# Patient Record
Sex: Female | Born: 1940
Health system: Southern US, Community
[De-identification: ages and names within clinical notes are randomized; demographics above are authoritative.]

## PROBLEM LIST (undated history)

## (undated) DIAGNOSIS — F32A Depression, unspecified: Secondary | ICD-10-CM

## (undated) DIAGNOSIS — R058 Other specified cough: Secondary | ICD-10-CM

## (undated) DIAGNOSIS — M1990 Unspecified osteoarthritis, unspecified site: Secondary | ICD-10-CM

## (undated) DIAGNOSIS — K219 Gastro-esophageal reflux disease without esophagitis: Secondary | ICD-10-CM

## (undated) DIAGNOSIS — J189 Pneumonia, unspecified organism: Secondary | ICD-10-CM

## (undated) DIAGNOSIS — R0789 Other chest pain: Secondary | ICD-10-CM

## (undated) DIAGNOSIS — K5900 Constipation, unspecified: Secondary | ICD-10-CM

## (undated) DIAGNOSIS — H3530 Unspecified macular degeneration: Secondary | ICD-10-CM

## (undated) DIAGNOSIS — I82409 Acute embolism and thrombosis of unspecified deep veins of unspecified lower extremity: Secondary | ICD-10-CM

## (undated) DIAGNOSIS — N760 Acute vaginitis: Secondary | ICD-10-CM

## (undated) DIAGNOSIS — I050 Rheumatic mitral stenosis: Secondary | ICD-10-CM

## (undated) DIAGNOSIS — I10 Essential (primary) hypertension: Secondary | ICD-10-CM

## (undated) DIAGNOSIS — R05 Cough: Secondary | ICD-10-CM

## (undated) DIAGNOSIS — T464X5A Adverse effect of angiotensin-converting-enzyme inhibitors, initial encounter: Secondary | ICD-10-CM

## (undated) DIAGNOSIS — R7309 Other abnormal glucose: Secondary | ICD-10-CM

## (undated) DIAGNOSIS — B9689 Other specified bacterial agents as the cause of diseases classified elsewhere: Secondary | ICD-10-CM

## (undated) DIAGNOSIS — R7989 Other specified abnormal findings of blood chemistry: Secondary | ICD-10-CM

## (undated) DIAGNOSIS — N183 Chronic kidney disease, stage 3 (moderate): Secondary | ICD-10-CM

## (undated) DIAGNOSIS — L0390 Cellulitis, unspecified: Secondary | ICD-10-CM

## (undated) DIAGNOSIS — N289 Disorder of kidney and ureter, unspecified: Secondary | ICD-10-CM

## (undated) DIAGNOSIS — F329 Major depressive disorder, single episode, unspecified: Secondary | ICD-10-CM

## (undated) DIAGNOSIS — D649 Anemia, unspecified: Secondary | ICD-10-CM

## (undated) DIAGNOSIS — I5030 Unspecified diastolic (congestive) heart failure: Secondary | ICD-10-CM

## (undated) DIAGNOSIS — E785 Hyperlipidemia, unspecified: Secondary | ICD-10-CM

## (undated) DIAGNOSIS — E559 Vitamin D deficiency, unspecified: Secondary | ICD-10-CM

## (undated) DIAGNOSIS — M353 Polymyalgia rheumatica: Secondary | ICD-10-CM

## (undated) DIAGNOSIS — R0902 Hypoxemia: Secondary | ICD-10-CM

## (undated) DIAGNOSIS — J449 Chronic obstructive pulmonary disease, unspecified: Secondary | ICD-10-CM

## (undated) DIAGNOSIS — M8580 Other specified disorders of bone density and structure, unspecified site: Secondary | ICD-10-CM

## (undated) HISTORY — DX: Hyperlipidemia, unspecified: E78.5

## (undated) HISTORY — DX: Hypoxemia: R09.02

## (undated) HISTORY — DX: Unspecified osteoarthritis, unspecified site: M19.90

## (undated) HISTORY — DX: Unspecified diastolic (congestive) heart failure: I50.30

## (undated) HISTORY — DX: Other chest pain: R07.89

## (undated) HISTORY — DX: Major depressive disorder, single episode, unspecified: F32.9

## (undated) HISTORY — DX: Other abnormal glucose: R73.09

## (undated) HISTORY — DX: Gastro-esophageal reflux disease without esophagitis: K21.9

## (undated) HISTORY — DX: Rheumatic mitral stenosis: I05.0

## (undated) HISTORY — DX: Other specified cough: T46.4X5A

## (undated) HISTORY — DX: Other specified disorders of bone density and structure, unspecified site: M85.80

## (undated) HISTORY — DX: Essential (primary) hypertension: I10

## (undated) HISTORY — DX: Other specified cough: R05.8

## (undated) HISTORY — DX: Depression, unspecified: F32.A

## (undated) HISTORY — DX: Chronic kidney disease, stage 3 (moderate): N18.3

## (undated) HISTORY — DX: Cough: R05

## (undated) HISTORY — DX: Hypercalcemia: E83.52

## (undated) HISTORY — DX: Pneumonia, unspecified organism: J18.9

## (undated) HISTORY — DX: Vitamin D deficiency, unspecified: E55.9

## (undated) HISTORY — DX: Other specified bacterial agents as the cause of diseases classified elsewhere: B96.89

## (undated) HISTORY — DX: Acute vaginitis: N76.0

## (undated) HISTORY — DX: Constipation, unspecified: K59.00

## (undated) HISTORY — DX: Cellulitis, unspecified: L03.90

## (undated) HISTORY — DX: Polymyalgia rheumatica: M35.3

## (undated) HISTORY — DX: Disorder of kidney and ureter, unspecified: N28.9

## (undated) HISTORY — DX: Anemia, unspecified: D64.9

## (undated) HISTORY — DX: Acute embolism and thrombosis of unspecified deep veins of unspecified lower extremity: I82.409

## (undated) HISTORY — DX: Other specified abnormal findings of blood chemistry: R79.89

## (undated) HISTORY — DX: Unspecified macular degeneration: H35.30

---
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
## 1966-01-20 HISTORY — PX: TUBAL LIGATION: SHX77

## 1985-01-20 DIAGNOSIS — I82409 Acute embolism and thrombosis of unspecified deep veins of unspecified lower extremity: Secondary | ICD-10-CM

## 1985-01-20 HISTORY — DX: Acute embolism and thrombosis of unspecified deep veins of unspecified lower extremity: I82.409

## 1997-05-19 ENCOUNTER — Ambulatory Visit (HOSPITAL_COMMUNITY): Admission: RE | Admit: 1997-05-19 | Discharge: 1997-05-19 | Payer: Self-pay | Admitting: Internal Medicine

## 1999-07-14 ENCOUNTER — Encounter: Payer: Self-pay | Admitting: Emergency Medicine

## 1999-07-14 ENCOUNTER — Emergency Department (HOSPITAL_COMMUNITY): Admission: EM | Admit: 1999-07-14 | Discharge: 1999-07-14 | Payer: Self-pay | Admitting: Emergency Medicine

## 1999-08-06 ENCOUNTER — Encounter: Payer: Self-pay | Admitting: Internal Medicine

## 1999-08-06 ENCOUNTER — Ambulatory Visit (HOSPITAL_COMMUNITY): Admission: RE | Admit: 1999-08-06 | Discharge: 1999-08-06 | Payer: Self-pay | Admitting: Internal Medicine

## 2000-07-20 ENCOUNTER — Ambulatory Visit (HOSPITAL_COMMUNITY): Admission: RE | Admit: 2000-07-20 | Discharge: 2000-07-20 | Payer: Self-pay | Admitting: Internal Medicine

## 2000-08-13 ENCOUNTER — Other Ambulatory Visit: Admission: RE | Admit: 2000-08-13 | Discharge: 2000-08-13 | Payer: Self-pay | Admitting: Internal Medicine

## 2003-07-30 ENCOUNTER — Emergency Department (HOSPITAL_COMMUNITY): Admission: EM | Admit: 2003-07-30 | Discharge: 2003-07-30 | Payer: Self-pay | Admitting: Emergency Medicine

## 2004-04-29 ENCOUNTER — Ambulatory Visit: Payer: Self-pay | Admitting: Internal Medicine

## 2004-05-17 ENCOUNTER — Ambulatory Visit: Payer: Self-pay | Admitting: Internal Medicine

## 2004-05-24 ENCOUNTER — Ambulatory Visit: Payer: Self-pay | Admitting: Internal Medicine

## 2004-05-24 ENCOUNTER — Other Ambulatory Visit: Admission: RE | Admit: 2004-05-24 | Discharge: 2004-05-24 | Payer: Self-pay | Admitting: Internal Medicine

## 2004-06-19 ENCOUNTER — Encounter: Payer: Self-pay | Admitting: Internal Medicine

## 2004-09-17 ENCOUNTER — Emergency Department (HOSPITAL_COMMUNITY): Admission: EM | Admit: 2004-09-17 | Discharge: 2004-09-17 | Payer: Self-pay | Admitting: Family Medicine

## 2004-10-18 ENCOUNTER — Ambulatory Visit: Payer: Self-pay | Admitting: Family Medicine

## 2005-02-19 ENCOUNTER — Ambulatory Visit: Payer: Self-pay | Admitting: Internal Medicine

## 2005-03-28 ENCOUNTER — Ambulatory Visit: Payer: Self-pay | Admitting: Internal Medicine

## 2005-04-08 ENCOUNTER — Ambulatory Visit: Payer: Self-pay | Admitting: Internal Medicine

## 2005-04-08 ENCOUNTER — Ambulatory Visit (HOSPITAL_COMMUNITY): Admission: RE | Admit: 2005-04-08 | Discharge: 2005-04-08 | Payer: Self-pay | Admitting: Internal Medicine

## 2005-04-18 ENCOUNTER — Ambulatory Visit: Payer: Self-pay | Admitting: Internal Medicine

## 2005-05-02 ENCOUNTER — Ambulatory Visit: Payer: Self-pay | Admitting: Internal Medicine

## 2005-07-24 ENCOUNTER — Ambulatory Visit: Payer: Self-pay | Admitting: Internal Medicine

## 2005-08-01 ENCOUNTER — Ambulatory Visit: Payer: Self-pay | Admitting: Internal Medicine

## 2005-08-08 ENCOUNTER — Ambulatory Visit: Payer: Self-pay | Admitting: Internal Medicine

## 2005-08-08 LAB — HM COLONOSCOPY

## 2005-10-29 ENCOUNTER — Ambulatory Visit: Payer: Self-pay | Admitting: Endocrinology

## 2005-11-18 ENCOUNTER — Ambulatory Visit: Payer: Self-pay | Admitting: Internal Medicine

## 2005-12-16 ENCOUNTER — Ambulatory Visit: Payer: Self-pay | Admitting: Internal Medicine

## 2005-12-16 LAB — CONVERTED CEMR LAB
BUN: 19 mg/dL (ref 6–23)
CO2: 28 meq/L (ref 19–32)
Chloride: 106 meq/L (ref 96–112)
Chol/HDL Ratio, serum: 5.3
Cholesterol: 220 mg/dL (ref 0–200)
Glucose, Bld: 97 mg/dL (ref 70–99)
HDL: 41.5 mg/dL (ref >39.0)
Hgb A1c MFr Bld: 5.6 % (ref 4.6–6.0)
Potassium: 4.6 meq/L (ref 3.5–5.1)
Triglyceride fasting, serum: 172 mg/dL — ABNORMAL HIGH (ref 0–149)
VLDL: 34 mg/dL (ref 0–40)

## 2005-12-23 ENCOUNTER — Ambulatory Visit: Payer: Self-pay | Admitting: Internal Medicine

## 2006-01-27 ENCOUNTER — Ambulatory Visit: Payer: Self-pay | Admitting: Internal Medicine

## 2006-01-27 LAB — CONVERTED CEMR LAB
ALT: 20 units/L (ref 0–40)
AST: 19 units/L (ref 0–37)
BUN: 19 mg/dL (ref 6–23)
CO2: 30 meq/L (ref 19–32)
Chol/HDL Ratio, serum: 3.1
Glomerular Filtration Rate, Af Am: 64 mL/min/{1.73_m2}
Glucose, Bld: 102 mg/dL — ABNORMAL HIGH (ref 70–99)
LDL Cholesterol: 80 mg/dL (ref 0–99)
Potassium: 4 meq/L (ref 3.5–5.1)
Triglyceride fasting, serum: 122 mg/dL (ref 0–149)
VLDL: 24 mg/dL (ref 0–40)

## 2006-02-10 ENCOUNTER — Ambulatory Visit: Payer: Self-pay | Admitting: Internal Medicine

## 2006-03-17 LAB — HM MAMMOGRAPHY

## 2006-04-15 ENCOUNTER — Emergency Department (HOSPITAL_COMMUNITY): Admission: EM | Admit: 2006-04-15 | Discharge: 2006-04-15 | Payer: Self-pay | Admitting: Emergency Medicine

## 2006-05-05 ENCOUNTER — Ambulatory Visit: Payer: Self-pay | Admitting: Internal Medicine

## 2006-05-21 ENCOUNTER — Ambulatory Visit: Payer: Self-pay

## 2006-05-21 ENCOUNTER — Ambulatory Visit: Payer: Self-pay | Admitting: Internal Medicine

## 2006-05-21 LAB — CONVERTED CEMR LAB
Chloride: 107 meq/L (ref 96–112)
GFR calc non Af Amer: 59 mL/min

## 2006-06-30 ENCOUNTER — Ambulatory Visit: Payer: Self-pay | Admitting: Internal Medicine

## 2006-08-21 ENCOUNTER — Encounter: Payer: Self-pay | Admitting: Internal Medicine

## 2006-08-21 DIAGNOSIS — I809 Phlebitis and thrombophlebitis of unspecified site: Secondary | ICD-10-CM | POA: Insufficient documentation

## 2006-08-21 DIAGNOSIS — J4540 Moderate persistent asthma, uncomplicated: Secondary | ICD-10-CM | POA: Insufficient documentation

## 2006-08-21 DIAGNOSIS — K219 Gastro-esophageal reflux disease without esophagitis: Secondary | ICD-10-CM | POA: Insufficient documentation

## 2006-08-21 DIAGNOSIS — M1990 Unspecified osteoarthritis, unspecified site: Secondary | ICD-10-CM | POA: Insufficient documentation

## 2006-09-09 ENCOUNTER — Emergency Department (HOSPITAL_COMMUNITY): Admission: EM | Admit: 2006-09-09 | Discharge: 2006-09-09 | Payer: Self-pay | Admitting: Emergency Medicine

## 2006-09-11 ENCOUNTER — Ambulatory Visit: Payer: Self-pay | Admitting: Internal Medicine

## 2006-09-11 ENCOUNTER — Ambulatory Visit: Payer: Self-pay | Admitting: Cardiology

## 2006-09-11 ENCOUNTER — Inpatient Hospital Stay (HOSPITAL_COMMUNITY): Admission: AD | Admit: 2006-09-11 | Discharge: 2006-09-15 | Payer: Self-pay | Admitting: Internal Medicine

## 2006-09-12 ENCOUNTER — Encounter: Payer: Self-pay | Admitting: Internal Medicine

## 2006-09-18 ENCOUNTER — Ambulatory Visit: Payer: Self-pay | Admitting: Internal Medicine

## 2006-09-18 ENCOUNTER — Encounter: Payer: Self-pay | Admitting: Cardiovascular Disease

## 2006-09-18 ENCOUNTER — Ambulatory Visit: Payer: Self-pay

## 2006-09-18 ENCOUNTER — Observation Stay (HOSPITAL_COMMUNITY): Admission: AD | Admit: 2006-09-18 | Discharge: 2006-09-19 | Payer: Self-pay | Admitting: Internal Medicine

## 2006-09-19 ENCOUNTER — Encounter: Payer: Self-pay | Admitting: Cardiovascular Disease

## 2006-09-19 ENCOUNTER — Ambulatory Visit: Payer: Self-pay | Admitting: Surgery

## 2006-10-01 ENCOUNTER — Ambulatory Visit: Payer: Self-pay | Admitting: Cardiology

## 2006-10-20 ENCOUNTER — Ambulatory Visit: Payer: Self-pay | Admitting: Internal Medicine

## 2006-11-03 ENCOUNTER — Ambulatory Visit: Payer: Self-pay | Admitting: Cardiology

## 2006-11-03 ENCOUNTER — Encounter: Payer: Self-pay | Admitting: Cardiology

## 2006-11-03 ENCOUNTER — Ambulatory Visit: Payer: Self-pay

## 2006-12-29 ENCOUNTER — Ambulatory Visit: Payer: Self-pay | Admitting: Internal Medicine

## 2006-12-29 DIAGNOSIS — M8580 Other specified disorders of bone density and structure, unspecified site: Secondary | ICD-10-CM | POA: Insufficient documentation

## 2006-12-29 DIAGNOSIS — E119 Type 2 diabetes mellitus without complications: Secondary | ICD-10-CM | POA: Insufficient documentation

## 2006-12-29 DIAGNOSIS — I10 Essential (primary) hypertension: Secondary | ICD-10-CM | POA: Insufficient documentation

## 2006-12-29 DIAGNOSIS — E782 Mixed hyperlipidemia: Secondary | ICD-10-CM | POA: Insufficient documentation

## 2006-12-29 DIAGNOSIS — R109 Unspecified abdominal pain: Secondary | ICD-10-CM | POA: Insufficient documentation

## 2006-12-29 HISTORY — DX: Other specified disorders of bone density and structure, unspecified site: M85.80

## 2006-12-29 LAB — CONVERTED CEMR LAB
ALT: 15 units/L (ref 0–35)
AST: 18 units/L (ref 0–37)
Bacteria, UA: NEGATIVE
CA 125: 11.3 units/mL (ref 0.0–30.2)
Calcium: 10 mg/dL (ref 8.4–10.5)
Chloride: 104 meq/L (ref 96–112)
Crystals: NEGATIVE
GFR calc Af Amer: 58 mL/min
HDL: 44.4 mg/dL (ref >39.0)
Hemoglobin, Urine: NEGATIVE
Hgb A1c MFr Bld: 5.7 % (ref 4.6–6.0)
LDL Cholesterol: 86 mg/dL (ref 0–99)
Mucus, UA: NEGATIVE
Total CHOL/HDL Ratio: 3.6
Total Protein, Urine: NEGATIVE mg/dL
Urine Glucose: NEGATIVE mg/dL
Urobilinogen, UA: 0.2 (ref 0.0–1.0)
VLDL: 30 mg/dL (ref 0–40)

## 2006-12-30 ENCOUNTER — Ambulatory Visit: Payer: Self-pay | Admitting: Internal Medicine

## 2006-12-30 ENCOUNTER — Encounter: Payer: Self-pay | Admitting: Internal Medicine

## 2007-01-28 ENCOUNTER — Ambulatory Visit: Payer: Self-pay | Admitting: Internal Medicine

## 2007-02-08 LAB — CONVERTED CEMR LAB: Pap Smear: NORMAL

## 2007-03-25 ENCOUNTER — Ambulatory Visit: Payer: Self-pay | Admitting: Internal Medicine

## 2007-03-25 DIAGNOSIS — F4323 Adjustment disorder with mixed anxiety and depressed mood: Secondary | ICD-10-CM

## 2007-06-01 ENCOUNTER — Ambulatory Visit: Payer: Self-pay | Admitting: Internal Medicine

## 2007-06-01 ENCOUNTER — Encounter (INDEPENDENT_AMBULATORY_CARE_PROVIDER_SITE_OTHER): Payer: Self-pay | Admitting: *Deleted

## 2007-06-01 DIAGNOSIS — I8390 Asymptomatic varicose veins of unspecified lower extremity: Secondary | ICD-10-CM | POA: Insufficient documentation

## 2008-01-10 ENCOUNTER — Ambulatory Visit: Payer: Self-pay | Admitting: Internal Medicine

## 2008-01-10 LAB — CONVERTED CEMR LAB
AST: 18 units/L (ref 0–37)
HDL: 57 mg/dL (ref >39.0)
LDL Cholesterol: 96 mg/dL (ref 0–99)
Triglycerides: 120 mg/dL (ref 0–149)
VLDL: 24 mg/dL (ref 0–40)

## 2008-01-11 ENCOUNTER — Ambulatory Visit: Payer: Self-pay | Admitting: Internal Medicine

## 2008-01-11 DIAGNOSIS — M109 Gout, unspecified: Secondary | ICD-10-CM | POA: Insufficient documentation

## 2008-01-11 DIAGNOSIS — H6980 Other specified disorders of Eustachian tube, unspecified ear: Secondary | ICD-10-CM | POA: Insufficient documentation

## 2008-02-23 ENCOUNTER — Ambulatory Visit: Payer: Self-pay | Admitting: Internal Medicine

## 2008-03-10 ENCOUNTER — Emergency Department (HOSPITAL_BASED_OUTPATIENT_CLINIC_OR_DEPARTMENT_OTHER): Admission: EM | Admit: 2008-03-10 | Discharge: 2008-03-10 | Payer: Self-pay | Admitting: Emergency Medicine

## 2008-03-10 ENCOUNTER — Ambulatory Visit: Payer: Self-pay | Admitting: Diagnostic Radiology

## 2008-07-07 ENCOUNTER — Ambulatory Visit: Payer: Self-pay | Admitting: Internal Medicine

## 2008-07-07 LAB — CONVERTED CEMR LAB
BUN: 23 mg/dL (ref 6–23)
Calcium: 9.1 mg/dL (ref 8.4–10.5)
Potassium: 4.1 meq/L (ref 3.5–5.1)

## 2008-07-10 ENCOUNTER — Ambulatory Visit: Payer: Self-pay | Admitting: Internal Medicine

## 2008-07-28 ENCOUNTER — Encounter: Payer: Self-pay | Admitting: Internal Medicine

## 2008-10-06 ENCOUNTER — Ambulatory Visit: Payer: Self-pay | Admitting: Internal Medicine

## 2008-10-06 ENCOUNTER — Ambulatory Visit (HOSPITAL_BASED_OUTPATIENT_CLINIC_OR_DEPARTMENT_OTHER): Admission: RE | Admit: 2008-10-06 | Discharge: 2008-10-06 | Payer: Self-pay | Admitting: Internal Medicine

## 2008-10-06 ENCOUNTER — Ambulatory Visit: Payer: Self-pay | Admitting: Diagnostic Radiology

## 2008-10-31 ENCOUNTER — Telehealth: Payer: Self-pay | Admitting: Internal Medicine

## 2009-01-29 ENCOUNTER — Ambulatory Visit: Payer: Self-pay | Admitting: Internal Medicine

## 2009-01-29 LAB — CONVERTED CEMR LAB
BUN: 21 mg/dL (ref 6–23)
Bilirubin, Direct: 0.1 mg/dL (ref 0.0–0.3)
CO2: 29 meq/L (ref 19–32)
Chloride: 110 meq/L (ref 96–112)
Cholesterol: 157 mg/dL (ref 0–200)
Creatinine, Ser: 1.2 mg/dL (ref 0.4–1.2)
Glucose, Bld: 92 mg/dL (ref 70–99)
LDL Cholesterol: 83 mg/dL (ref 0–99)
Potassium: 4.3 meq/L (ref 3.5–5.1)
Total CHOL/HDL Ratio: 3
VLDL: 27.4 mg/dL (ref 0.0–40.0)

## 2009-02-06 ENCOUNTER — Ambulatory Visit: Payer: Self-pay | Admitting: Internal Medicine

## 2009-02-07 ENCOUNTER — Encounter: Payer: Self-pay | Admitting: Internal Medicine

## 2009-03-13 ENCOUNTER — Ambulatory Visit: Payer: Self-pay | Admitting: Internal Medicine

## 2009-03-26 ENCOUNTER — Ambulatory Visit: Payer: Self-pay | Admitting: Internal Medicine

## 2009-03-26 ENCOUNTER — Ambulatory Visit: Payer: Self-pay | Admitting: Radiology

## 2009-03-26 ENCOUNTER — Ambulatory Visit (HOSPITAL_BASED_OUTPATIENT_CLINIC_OR_DEPARTMENT_OTHER): Admission: RE | Admit: 2009-03-26 | Discharge: 2009-03-26 | Payer: Self-pay | Admitting: Internal Medicine

## 2009-04-06 ENCOUNTER — Ambulatory Visit: Payer: Self-pay | Admitting: Family Medicine

## 2009-04-06 ENCOUNTER — Encounter: Payer: Self-pay | Admitting: Internal Medicine

## 2009-04-17 ENCOUNTER — Encounter (INDEPENDENT_AMBULATORY_CARE_PROVIDER_SITE_OTHER): Payer: Self-pay | Admitting: *Deleted

## 2009-04-23 ENCOUNTER — Ambulatory Visit: Payer: Self-pay | Admitting: Internal Medicine

## 2009-04-23 LAB — CONVERTED CEMR LAB
BUN: 18 mg/dL (ref 6–23)
Basophils Absolute: 0 10*3/uL (ref 0.0–0.1)
CO2: 25 meq/L (ref 19–32)
Chloride: 107 meq/L (ref 96–112)
Eosinophils Absolute: 0.3 10*3/uL (ref 0.0–0.7)
Eosinophils Relative: 5 % (ref 0–5)
Glucose, Bld: 94 mg/dL (ref 70–99)
HDL goal, serum: 40 mg/dL
Hemoglobin: 11.4 g/dL — ABNORMAL LOW (ref 12.0–15.0)
LDL Goal: 130 mg/dL
Lymphocytes Relative: 20 % (ref 12–46)
Monocytes Absolute: 0.8 10*3/uL (ref 0.1–1.0)
Monocytes Relative: 12 % (ref 3–12)
Neutro Abs: 4.4 10*3/uL (ref 1.7–7.7)
Neutrophils Relative %: 64 % (ref 43–77)
Potassium: 4.4 meq/L (ref 3.5–5.3)
RDW: 14.3 % (ref 11.5–15.5)
Sodium: 142 meq/L (ref 135–145)
WBC: 6.9 10*3/uL (ref 4.0–10.5)

## 2009-04-24 ENCOUNTER — Telehealth: Payer: Self-pay | Admitting: Internal Medicine

## 2009-04-27 ENCOUNTER — Ambulatory Visit (HOSPITAL_BASED_OUTPATIENT_CLINIC_OR_DEPARTMENT_OTHER): Admission: RE | Admit: 2009-04-27 | Discharge: 2009-04-27 | Payer: Self-pay | Admitting: Internal Medicine

## 2009-04-27 ENCOUNTER — Ambulatory Visit: Payer: Self-pay | Admitting: Radiology

## 2009-04-29 ENCOUNTER — Telehealth: Payer: Self-pay | Admitting: Internal Medicine

## 2009-05-17 ENCOUNTER — Ambulatory Visit: Payer: Self-pay | Admitting: Internal Medicine

## 2009-05-22 ENCOUNTER — Ambulatory Visit (HOSPITAL_COMMUNITY): Admission: RE | Admit: 2009-05-22 | Discharge: 2009-05-22 | Payer: Self-pay | Admitting: Internal Medicine

## 2009-05-22 ENCOUNTER — Ambulatory Visit: Payer: Self-pay | Admitting: Cardiology

## 2009-05-22 ENCOUNTER — Encounter: Payer: Self-pay | Admitting: Internal Medicine

## 2009-05-22 ENCOUNTER — Ambulatory Visit: Payer: Self-pay

## 2009-05-23 ENCOUNTER — Ambulatory Visit: Payer: Self-pay | Admitting: Pulmonary Disease

## 2009-05-23 DIAGNOSIS — J309 Allergic rhinitis, unspecified: Secondary | ICD-10-CM | POA: Insufficient documentation

## 2009-06-11 ENCOUNTER — Ambulatory Visit: Payer: Self-pay | Admitting: Internal Medicine

## 2009-06-11 LAB — CONVERTED CEMR LAB
BUN: 31 mg/dL — ABNORMAL HIGH (ref 6–23)
Calcium: 9.7 mg/dL (ref 8.4–10.5)
Chloride: 104 meq/L (ref 96–112)
Potassium: 4.5 meq/L (ref 3.5–5.3)
Sodium: 141 meq/L (ref 135–145)

## 2009-06-12 ENCOUNTER — Telehealth: Payer: Self-pay | Admitting: Internal Medicine

## 2009-07-09 ENCOUNTER — Ambulatory Visit: Payer: Self-pay | Admitting: Internal Medicine

## 2009-07-09 LAB — CONVERTED CEMR LAB
BUN: 31 mg/dL — ABNORMAL HIGH (ref 6–23)
Calcium: 9.4 mg/dL (ref 8.4–10.5)
Creatinine, Ser: 1.4 mg/dL — ABNORMAL HIGH (ref 0.4–1.2)
GFR calc non Af Amer: 40.38 mL/min (ref >60)
Glucose, Bld: 99 mg/dL (ref 70–99)
Sodium: 145 meq/L (ref 135–145)

## 2009-08-23 ENCOUNTER — Encounter: Payer: Self-pay | Admitting: Internal Medicine

## 2009-12-31 ENCOUNTER — Ambulatory Visit: Payer: Self-pay | Admitting: Internal Medicine

## 2010-01-07 ENCOUNTER — Ambulatory Visit: Payer: Self-pay | Admitting: Internal Medicine

## 2010-01-15 ENCOUNTER — Ambulatory Visit
Admission: RE | Admit: 2010-01-15 | Discharge: 2010-01-15 | Payer: Self-pay | Source: Home / Self Care | Attending: Family | Admitting: Family

## 2010-01-15 ENCOUNTER — Ambulatory Visit (HOSPITAL_BASED_OUTPATIENT_CLINIC_OR_DEPARTMENT_OTHER)
Admission: RE | Admit: 2010-01-15 | Discharge: 2010-01-15 | Payer: Self-pay | Source: Home / Self Care | Attending: Internal Medicine | Admitting: Internal Medicine

## 2010-01-17 LAB — CONVERTED CEMR LAB
BUN: 29 mg/dL — ABNORMAL HIGH (ref 6–23)
GFR calc non Af Amer: 54.07 mL/min — ABNORMAL LOW (ref >60.00)
Glucose, Bld: 83 mg/dL (ref 70–99)
Potassium: 4.1 meq/L (ref 3.5–5.1)

## 2010-01-22 ENCOUNTER — Ambulatory Visit
Admission: RE | Admit: 2010-01-22 | Discharge: 2010-01-22 | Payer: Self-pay | Source: Home / Self Care | Attending: Internal Medicine | Admitting: Internal Medicine

## 2010-02-19 NOTE — Assessment & Plan Note (Signed)
Summary: 1 month follow up/mhf   Vital Signs:  Patient profile:   70 year old female Weight:      221 pounds BMI:     36.91 O2 Sat:      97 % on Room air Temp:     98.5 degrees F oral Pulse rate:   75 / minute Pulse rhythm:   regular Resp:     18 per minute BP sitting:   128 / 70  (left arm) Cuff size:   large  Vitals Entered By: Jiles Garter CMA (July 09, 2009 4:17 PM)  O2 Flow:  Room air CC: Rm 3- 1 Month Follow up Is Patient Diabetic? No Comments no changes in medication, she stopped taking the Metformin , requesting refills on all medications to [facility]   Primary Care Provider:  D. Drema Pry DO  CC:  Rm 3- 1 Month Follow up.  History of Present Illness:  Hypertension Follow-Up      This is a 70 year old woman who presents for Hypertension follow-up.  The patient denies lightheadedness and edema.  The patient denies the following associated symptoms: chest pain.  Compliance with medications (by patient report) has been near 100%.  The patient reports that dietary compliance has been fair.    asthma - improved.  less dyspnea.  resp status also improved after starting diuretic  Allergies: 1)  ! Sulfa 2)  ! Ace Inhibitors 3)  ! Indocin 4)  ! Singulair (Montelukast Sodium) 5)  Tamiflu (Oseltamivir Phosphate)  Past History:  Past Medical History: Asthma       - PFT 02/06/09 FEV1 1.41(65%), FVC 1.92(64), FEV1% 74, TLC 3.47(71%), DLCO 48%, +BD ACE inhibitor cough DVT, hx of  1987      GERD   Osteoarthritis    Atypical chest pain S/P cath      Normal coronaries    Non ST elevation myocarial infarction    Rt groin pseudoaneurysm   Hyperlipidemia Hypertension Depression   Gout Abnormal glucose tolerance  Past Surgical History: Appendectomy 1951 Tubal ligation 1968 Tonsillectomy 1950        Family History: Family History of CAD Female 1st degree relative <60 Father deceased age 70 secondary to suicide 3 brothers with CAD 3 sisters with CAD and asthma                Social History: Occupation: Editor, 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   Lungs:  normal respiratory effort, normal breath sounds, no crackles, and no wheezes.   Heart:  normal rate, regular rhythm, and no gallop.   Extremities:  trace left pedal edema and trace right pedal edema.     Impression & Recommendations:  Problem # 1:  ASTHMA (ICD-493.90) Assessment Improved continue maintenance inhalers  Her updated medication list for this problem includes:    Spiriva Handihaler 18 Mcg Caps (Tiotropium bromide monohydrate) ..... One puff once daily    Symbicort 160-4.5 Mcg/act Aero (Budesonide-formoterol fumarate) .Marland Kitchen... 2 puffs two times a day    Proair Hfa 108 (90 Base) Mcg/act Aers (Albuterol sulfate) .Marland Kitchen... 2 puffs q 4 hrs prn  Pulmonary Functions Reviewed: O2 sat: 97 (07/09/2009)  Problem # 2:  HYPERTENSION (ICD-401.9) Assessment: Improved  Her updated medication list for this problem includes:    Furosemide 40 Mg Tabs (Furosemide) ..... One  half tab every other day    Losartan Potassium 50 Mg Tabs (Losartan potassium) ..... One by mouth once daily  BP today: 128/70 Prior BP: 139/83 (06/11/2009)  Prior 10 Yr Risk Heart Disease: 11 % (04/23/2009)  Labs Reviewed: K+: 4.5 (06/11/2009) Creat: : 1.41 (06/11/2009)   Chol: 157 (01/29/2009)   HDL: 46.80 (01/29/2009)   LDL: 83 (01/29/2009)   TG: 137.0 (01/29/2009)  Complete Medication List: 1)  Spiriva Handihaler 18 Mcg Caps (Tiotropium bromide monohydrate) .... One puff once daily 2)  Symbicort 160-4.5 Mcg/act Aero (Budesonide-formoterol fumarate) .... 2 puffs two times a day 3)  Cetirizine Hcl 10 Mg Tabs (Cetirizine hcl) .... One by mouth qd 4)  Fluticasone Propionate 50 Mcg/act Susp (Fluticasone propionate) .... 2 sprays each nostril once daily 5)  Proair Hfa 108 (90 Base) Mcg/act Aers  (Albuterol sulfate) .... 2 puffs q 4 hrs prn 6)  Furosemide 40 Mg Tabs (Furosemide) .... One half tab every other day 7)  Losartan Potassium 50 Mg Tabs (Losartan potassium) .... One by mouth once daily 8)  Pravastatin Sodium 20 Mg Tabs (Pravastatin sodium) .... Take 1 tab by mouth at bedtime 9)  Pantoprazole Sodium 40 Mg Tbec (Pantoprazole sodium) .... One by mouth two times a day 10)  Sertraline Hcl 50 Mg Tabs (Sertraline hcl) .... Take 1 tablet by mouth once a day 11)  Fosamax 70 Mg Tabs (Alendronate sodium) .... One by mouth qwkly 12)  Allopurinol 100 Mg Tabs (Allopurinol) .... 2 tabs by mouth qd 13)  Klor-con M20 20 Meq Cr-tabs (Potassium chloride crys cr) .... One by mouth once daily  Patient Instructions: 1)  Please schedule a follow-up appointment in 6 months. 2)  BMP prior to visit, ICD-9: 401.9 3)  HbgA1C prior to visit, ICD-9:  790.29 4)  Please return for lab work one (1) week before your next appointment.  Prescriptions: KLOR-CON M20 20 MEQ CR-TABS (POTASSIUM CHLORIDE CRYS CR) one by mouth once daily  #90 x 3   Entered and Authorized by:   D. Drema Pry DO   Signed by:   D. Drema Pry DO on 07/09/2009   Method used:   Electronically to        [facility] (retail)             ,          Ph: [phone]       Fax: [phone]   RxID:   [id number] ALLOPURINOL 100 MG TABS (ALLOPURINOL) 2 tabs by mouth qd  #180 x 3   Entered and Authorized by:   D. Drema Pry DO   Signed by:   D. Drema Pry DO on 07/09/2009   Method used:   Electronically to        [facility] (retail)             ,          Ph: [phone]       Fax: [phone]   RxID:   [id number] SERTRALINE HCL 50 MG  TABS (SERTRALINE HCL) Take 1 tablet by mouth once a day  #90 x 3   Entered and Authorized by:   D. Drema Pry DO   Signed by:   D. Drema Pry DO on 07/09/2009   Method used:   Electronically to        [facility] (retail)             ,  Ph: [phone]       Fax:  [phone]   [id number] PANTOPRAZOLE SODIUM 40 MG TBEC (PANTOPRAZOLE SODIUM) one by mouth two times a day  #180 x 3   Entered and Authorized by:   D. Drema Pry DO   Signed by:   D. Drema Pry DO on 07/09/2009   Method used:   Electronically to        [facility] (retail)             ,          Ph: [phone]       Fax: [phone]   [id number] PRAVASTATIN SODIUM 20 MG TABS (PRAVASTATIN SODIUM) Take 1 tab by mouth at bedtime  #90 x 3   Entered and Authorized by:   D. Drema Pry DO   Signed by:   D. Drema Pry DO on 07/09/2009   Method used:   Electronically to        [facility] (retail)             ,          Ph: [phone]       Fax: [phone]   RxID:   [id number] SYMBICORT 160-4.5 MCG/ACT AERO (BUDESONIDE-FORMOTEROL FUMARATE) 2 puffs two times a day  #3 month x 3   Entered and Authorized by:   D. Drema Pry DO   Signed by:   D. Drema Pry DO on 07/09/2009   Method used:   Electronically to        [facility] (retail)             ,          Ph: [phone]       Fax: [phone]   RxID:   [id number] SPIRIVA HANDIHALER 18 MCG CAPS (TIOTROPIUM BROMIDE MONOHYDRATE) one puff once daily  #90 x 3   Entered and Authorized by:   D. Drema Pry DO   Signed by:   D. Drema Pry DO on 07/09/2009   Method used:   Electronically to        [facility] (retail)             ,          Ph: [phone]       Fax: [phone]   RxID:   [id number] LOSARTAN POTASSIUM 50 MG TABS (LOSARTAN POTASSIUM) one by mouth once daily  #30 x 5   Entered and Authorized by:   D. Drema Pry DO   Signed by:   D. Drema Pry DO on 07/09/2009   Method used:   Electronically to        [facility]       [address]       [place], [place]  [address]       Ph: [phone]       Fax: [phone]   RxID:   [id number]

## 2010-02-19 NOTE — Miscellaneous (Signed)
  Clinical Lists Changes 

## 2010-02-19 NOTE — Progress Notes (Signed)
Summary: Lab Results pt returned your call with a different #   Phone Note Outgoing Call   Summary of Call: left message for pt to call office re:  blood test results Initial call taken by: D. Drema Pry DO,  April 24, 2009 1:20 PM  Follow-up for Phone Call        call pt - have her come in for CT of chest PE protocol - see orders Follow-up by: D. Drema Pry DO,  April 24, 2009 2:48 PM  Additional Follow-up for Phone Call Additional follow up Details #1::        call placed to patient at [phone], no answer, voice message left for patient to return call, attempted to contact patient at [phone], fast busy tone x3 reached Additional Follow-up by: Jiles Garter CMA,  April 24, 2009 3:53 PM    Additional Follow-up for Phone Call Additional follow up Details #2::    attempted to contact patient no answer, voice recording, no additional message left, previous message un-answered Follow-up by: Jiles Garter CMA,  April 24, 2009 5:33 PM  Additional Follow-up for Phone Call Additional follow up Details #3:: Details for Additional Follow-up Action Taken: patient returned your call please call her back at work [employer]  April 25, 2009 8:27 AM  patient has been advised per Dr Shawna Orleans instructions,she request appointment information be left on her home phone number Jiles Garter CMA  April 25, 2009 9:22 AM

## 2010-02-19 NOTE — Progress Notes (Signed)
  Phone Note Outgoing Call   Summary of Call: call pt - CT of chest negative for blood clot.   however, I suggest f/u appt within 1-2 weeks to further discuss CT scan findings Initial call taken by: D. Drema Pry DO,  April 29, 2009 7:38 AM  Follow-up for Phone Call        Phone call completed Advised pt  CT negative for blood clot, scheduled appt 05/07/09 Diane Tomerlin  April 30, 2009 11:15 AM

## 2010-02-19 NOTE — Assessment & Plan Note (Signed)
Summary: dyspnea/apc   Visit Type:  Initial Consult Primary Provider/Referring Provider:  Jennings Books DO  CC:  Pulmonary consult for SOB. The patient c/o increased sob with exertion x6 months..  History of Present Illness: 70 yo female for evaluation of dyspnea.  She has been having trouble with her breathing for years.  This has been getting gradual worse over the past 6 months.  She does wheeze, and has a cough with creamy sputum.  She denies fever, or hemoptysis.  She has trouble getting air into her lung.  She is not having chest pain or palpitations.  She does get allergies, and these are triggered by dust, cigarette smoke, perfume, and grass.  She has never had allergy tests, and has never been on allergy shots.  She has been told she has asthma.  She can only walk about 100 ft before she gets winded.  She sometimes gets dizzy, and feels faint.  She has never actually passed out.  She does get sinus congestion, and uses a neti pott at times.  She has used zyrtec and flonase, but is not currently.  She does not recall having sinus imaging studies.  She has never smoked cigarettes, but both her parents did when she was a child.  She has been treated for pneumonia a few times before, and gets frequent episodes of bronchitis.  She has to take antibiotics several times a year.  She was treated with prednisone in April, and this helped.  She has been using symbicort for about one year, and feels this helps also.  She uses proair at least once per day.  There is no history of TB.  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  She is from [place], and denies recent travel history.  There is no history of occupational exposure, and she denies recent sick exposures.  PFT February 06, 2009: FEV1 1.41(65%), FVC 1.92(64), FEV1% 74, TLC 3.47(71%), DLCO 48%, +BD CT chest w/o contrast April 27, 2009: negative for pulmonary parenchymal disease Echo May 22, 2009: EF [id number], grade 1 diastolic dysfx V/Q  scan May 22, 2009: low probability for PE  Preventive Screening-Counseling & Management  Alcohol-Tobacco     Alcohol drinks/day: 0     Smoking Status: never  Current Medications (verified): 1)  Cozaar 100 Mg  Tabs (Losartan Potassium) .... Take 1 Tablet By Mouth Once A Day 2)  Metformin Hcl 500 Mg  Tb24 (Metformin Hcl) .... Take 1 Tablet By Mouth Two Times A Day 3)  Pravastatin Sodium 20 Mg Tabs (Pravastatin Sodium) .... Take 1 Tab By Mouth At Bedtime 4)  Pantoprazole Sodium 40 Mg Tbec (Pantoprazole Sodium) .... One By Mouth Two Times A Day 5)  Proair Hfa 108 (90 Base) Mcg/act Aers (Albuterol Sulfate) .... 2 Puffs Q 4 Hrs Prn 6)  Fosamax 70 Mg  Tabs (Alendronate Sodium) .... One By Mouth Qwkly 7)  Sertraline Hcl 50 Mg  Tabs (Sertraline Hcl) .... Take 1 Tablet By Mouth Once A Day 8)  Allopurinol 100 Mg Tabs (Allopurinol) .... 2 Tabs By Mouth Qd 9)  Cetirizine Hcl 10 Mg Tabs (Cetirizine Hcl) .... One By Mouth Qd 10)  Symbicort 160-4.5 Mcg/act Aero (Budesonide-Formoterol Fumarate) .... 2 Puffs Two Times A Day 11)  Fluticasone Propionate 50 Mcg/act Susp (Fluticasone Propionate) .... 2 Sprays Each Nostril Once Daily 12)  Furosemide 20 Mg Tabs (Furosemide) .... One By Mouth Qd  Allergies (verified): 1)  ! Sulfa 2)  ! Ace Inhibitors  3)  ! Indocin 4)  Tamiflu (Oseltamivir Phosphate)  Past History:  Past Medical History: Asthma       - PFT 02/06/09 FEV1 1.41(65%), FVC 1.92(64), FEV1% 74, TLC 3.47(71%), DLCO 48%, +BD ACE inhibitor cough DVT, hx of  1987    GERD   Osteoarthritis    Atypical chest pain S/P cath      Normal coronaries    Non ST elevation myocarial infarction    Rt groin pseudoaneurysm   Hyperlipidemia Hypertension Depression   Gout Abnormal glucose tolerance  Past Surgical History: Appendectomy 1951 Tubal ligation 1968 Tonsillectomy 1950       Family History: Reviewed history from 05/17/2009 and no changes required. Family History of CAD Female 1st degree  relative <60 Father deceased age 66 secondary to suicide 3 brothers with CAD 3 sisters with CAD and asthma             Social History: Reviewed history from 05/17/2009 and no changes required. 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  The patient denies shortness of breath at rest, coughing up blood, chest pain, irregular heartbeats, loss of appetite, weight change, abdominal pain, difficulty swallowing, tooth/dental problems, rash, change in color of mucus, and fever.    Vital Signs:  Patient profile:   70 year old female Height:      65 inches (165.10 cm) Weight:      216 pounds (98.18 kg) BMI:     36.07 O2 Sat:      94 % on Room air Temp:     98.1 degrees F (36.72 degrees C) oral Pulse rate:   83 / minute BP sitting:   134 / 70  (left arm) Cuff size:   large  Vitals Entered By: Francesca Jewett CMA (May 23, 2009 3:17 PM)  O2 Sat at Rest %:  94 O2 Flow:  Room air CC: Pulmonary consult for SOB. The patient c/o increased sob with exertion x6 months. Is Patient Diabetic? Yes   Physical Exam  General:  normal appearance, healthy appearing, and obese.   Eyes:  PERRLA and EOMI wears glasses Ears:  TMs intact and clear with normal canals Nose:  pale mucosa, no discharge Mouth:  no deformity or lesions Neck:  supple, no masses, and no neck tenderness.   Chest Wall:  no deformities noted Lungs:  normal respiratory effort and normal breath sounds.   Heart:  normal rate, regular rhythm, and no gallop.   Abdomen:  bowel sounds positive; abdomen soft and non-tender without masses, or  organomegaly Msk:  no deformity or scoliosis noted with normal posture Pulses:  pulses normal Extremities:  no clubbing, cyanosis, edema, or deformity noted Neurologic:  CN II-XII grossly intact with normal reflexes, coordination, muscle strength and tone Cervical Nodes:  no significant adenopathy Psych:  alert and cooperative; normal mood and affect; normal attention span and concentration   Impression & Recommendations:  Problem # 1:  ASTHMA (ICD-493.90) She has typical symptoms of asthma.  She has borderline obstruction with bronchodilator response on PFT.  She has frequent exacerbations requiring antibiotics and prednisone.  She is  to continue symbicort and as needed proair.  Will add spiriva and singulair.  I don't think she needs prednisone or antibiotics at this time.  Problem # 2:  ALLERGIC RHINITIS (ICD-477.9) I am concerned that her allergies may be contributing to her asthma symptoms.  She is to continue on her anti-histamines and flonase.  Hopefully singulair will help with this also.  Will check her RAST and IgE.  She may also need additional imaging studies of her sinuses.  Medications Added to Medication List This Visit: 1)  Spiriva Handihaler 18 Mcg Caps (Tiotropium bromide monohydrate) .... One puff once daily 2)  Singulair 10 Mg Tabs (Montelukast sodium) .... One by mouth at bedtime  Complete Medication List: 1)  Spiriva Handihaler 18 Mcg Caps (Tiotropium bromide monohydrate) .... One puff once daily 2)  Symbicort 160-4.5 Mcg/act Aero (Budesonide-formoterol fumarate) .... 2 puffs two times a day 3)  Singulair 10 Mg Tabs (Montelukast sodium) .... One by mouth at bedtime 4)  Cetirizine Hcl 10 Mg Tabs (Cetirizine hcl) .... One by mouth qd 5)  Fluticasone Propionate 50 Mcg/act Susp (Fluticasone propionate) .... 2 sprays each nostril once daily 6)  Proair Hfa 108 (90 Base) Mcg/act Aers (Albuterol sulfate) .... 2 puffs q 4 hrs prn 7)  Furosemide 20 Mg Tabs (Furosemide) ....  One by mouth qd 8)  Cozaar 100 Mg Tabs (Losartan potassium) .... Take 1 tablet by mouth once a day 9)  Pravastatin Sodium 20 Mg Tabs (Pravastatin sodium) .... Take 1 tab by mouth at bedtime 10)  Metformin Hcl 500 Mg Tb24 (Metformin hcl) .... Take 1 tablet by mouth two times a day 11)  Pantoprazole Sodium 40 Mg Tbec (Pantoprazole sodium) .... One by mouth two times a day 12)  Sertraline Hcl 50 Mg Tabs (Sertraline hcl) .... Take 1 tablet by mouth once a day 13)  Fosamax 70 Mg Tabs (Alendronate sodium) .... One by mouth qwkly 14)  Allopurinol 100 Mg Tabs (Allopurinol) .... 2 tabs by mouth qd  Other Orders: Consultation Level IV [id number]) T-"RAST" (Allergy Full Profile) IGE [id number])  Patient Instructions: 1)  Spiriva one puff once daily  2)  Singulair 10 mg at bedtime  3)  Blood test today 4)  Follow up in one month Prescriptions: SINGULAIR 10 MG TABS (MONTELUKAST SODIUM) one by mouth at bedtime  #30 x 3   Entered and Authorized by:   Chesley Mires MD   Signed by:   Chesley Mires MD on 05/23/2009   Method used:   Electronically to        [facility] (retail)       [place], [place]  [address]       Ph: [phone]       Fax: [phone]   RxID:   [id number] SPIRIVA HANDIHALER 18 MCG CAPS (TIOTROPIUM BROMIDE MONOHYDRATE) one puff once daily  #30 x 3   Entered and Authorized by:   Chesley Mires MD   Signed by:   Chesley Mires MD on 05/23/2009   Method used:   Electronically to        [facility] (retail)       [address]       [place], [place]  [address]       Ph: [phone]       Fax: [phone]   RxID:  [id number]  

## 2010-02-19 NOTE — Assessment & Plan Note (Signed)
Summary: 1 month follow up/mhf   Vital Signs:  Patient profile:   70 year old female Height:      65 inches Weight:      219.50 pounds BMI:     36.66 O2 Sat:      94 % on Room air Temp:     98.1 degrees F oral Pulse rate:   87 / minute Pulse rhythm:   regular Resp:     22 per minute BP sitting:   142 / 70  (right arm) Cuff size:   large  Vitals Entered By: Jiles Garter CMA (April 23, 2009 3:07 PM)  O2 Flow:  Room air CC: Rm 2- 1  Month Follow up disease management, Lipid Management   Primary Care Provider:  Jennings Books DO  CC:  Rm 2- 1  Month Follow up disease management and Lipid Management.  History of Present Illness: 70 y/o white female for follow up prev flu like symptoms improved however, she still reports dyspnea no fever,  no URI symptoms  Lipid Management History:      Positive NCEP/ATP III risk factors include female age 69 years old or older and hypertension.  Negative NCEP/ATP III risk factors include non-tobacco-user status.    Allergies: 1)  ! Sulfa 2)  ! Ace Inhibitors 3)  ! 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   Lungs:  normal respiratory effort and normal breath sounds.   Heart:  normal rate, regular rhythm, and no gallop.    Extremities:  trace left pedal edema and trace right pedal edema.   Neurologic:  cranial nerves II-XII intact and gait normal.     Impression & Recommendations:  Problem # 1:  DYSPNEA (ICD-786.05) 70 y/o with persistent dyspnea.   arrange further work up Orders: T-Basic Metabolic Panel (99991111) T-CBC w/Diff [id number]) T-TSH [id number]) T- * Misc. Laboratory test [id number]) T-BNP  (B Natriuretic Peptide) [phone])  Problem # 2:  ASTHMA (ICD-493.90) continue maintenance inhalers.  pt would like to purchase her own nebulizer.  GERD is suboptimally controlled.  change to protonix two times a day   The following medications were removed from the medication list:    Prednisone 10 Mg Tabs (Prednisone) .Marland KitchenMarland KitchenMarland KitchenMarland Kitchen 4 tabs by mouth q.d. x 3 days, 3 tabs by mouth q.d. x 3 days, 2 tabs p.o. once daily x 3 days, 1 tab p.o. x 3 days Her updated medication list for this problem includes:    Proair Hfa 108 (90 Base) Mcg/act Aers (Albuterol sulfate) .Marland Kitchen... 2 puffs q 4 hrs prn    Symbicort 160-4.5 Mcg/act Aero (Budesonide-formoterol fumarate) .Marland KitchenMarland KitchenMarland KitchenMarland Kitchen  2 puffs two times a day  Orders: Misc. Referral (Misc. Ref)  Complete Medication List: 1)  Cozaar 100 Mg Tabs (Losartan potassium) .... Take 1 tablet by mouth once a day 2)  Metformin Hcl 500 Mg Tb24 (Metformin hcl) .... Take 1 tablet by mouth two times a day 3)  Pravastatin Sodium 20 Mg Tabs (Pravastatin sodium) .... Take 1 tab by mouth at bedtime 4)  Pantoprazole Sodium 40 Mg Tbec (Pantoprazole sodium) .... One by mouth two times a day 5)  Proair Hfa 108 (90 Base) Mcg/act Aers (Albuterol sulfate) .... 2 puffs q 4 hrs prn 6)  Fosamax 70 Mg Tabs (Alendronate sodium) .... One by mouth qwkly 7)  Sertraline Hcl 50 Mg Tabs (Sertraline hcl) .... Take 1 tablet by mouth once a day 8)  Allopurinol 100 Mg Tabs (Allopurinol) .... 2 tabs by mouth qd 9)  Cetirizine Hcl 10 Mg Tabs (Cetirizine hcl) .... One by mouth qd 10)  Symbicort 160-4.5 Mcg/act Aero  (Budesonide-formoterol fumarate) .... 2 puffs two times a day 11)  Fluticasone Propionate 50 Mcg/act Susp (Fluticasone propionate) .... 2 sprays each nostril once daily  Lipid Assessment/Plan:      Based on NCEP/ATP III, the patient's risk factor category is "0-1 risk factors".  The patient's lipid goals are as follows: Total cholesterol goal is 200; LDL cholesterol goal is 130; HDL cholesterol goal is 40; Triglyceride goal is 150.      Patient Instructions: 1)  Raise the head of your bed 8-10 inches 2)  Please schedule a follow-up appointment in 1 month. Prescriptions: FLUTICASONE PROPIONATE 50 MCG/ACT SUSP (FLUTICASONE PROPIONATE) 2 sprays each nostril once daily  #1 x 5   Entered and Authorized by:   D. Drema Pry DO   Signed by:   D. Drema Pry DO on 04/23/2009   Method used:   Electronically to        [facility] (retail)       [address]       [place], [place]  [address]       Ph: [phone]       Fax: [phone]   RxID:   [id number] PANTOPRAZOLE SODIUM 40 MG TBEC (PANTOPRAZOLE SODIUM) one by mouth two times a day  #60 x 5   Entered and Authorized by:   D. Drema Pry DO   Signed by:   D. Drema Pry DO on 04/23/2009   Method used:   Electronically to        [facility] (retail)       [address]       [place], [place]  [address]       Ph: [phone]       Fax: [phone]   RxID:   [id number]   Current Allergies (reviewed today): ! SULFA ! ACE INHIBITORS ! INDOCIN TAMIFLU (OSELTAMIVIR PHOSPHATE)

## 2010-02-19 NOTE — Miscellaneous (Signed)
Summary: Orders Update pft charges  Clinical Lists Changes  Orders: Added new Service order of Carbon Monoxide diffusing w/capacity (94720) - Signed Added new Service order of Lung Volumes (94240) - Signed Added new Service order of Spirometry (Pre & Post) (94060) - Signed 

## 2010-02-19 NOTE — Assessment & Plan Note (Signed)
Summary: SINUS INFECTION OR FLU/MHF   Vital Signs:  Patient profile:   70 year old female Weight:      221 pounds BMI:     36.91 O2 Sat:      94 % Temp:     97.9 degrees F oral Pulse rate:   109 / minute Pulse rhythm:   regular Resp:     24 per minute BP sitting:   118 / 76  (right arm) Cuff size:   large  Vitals Entered By: Jiles Garter CMA (March 13, 2009 4:25 PM) CC: RM 2- C/o cough, chill and body aches, URI symptoms Comments c/o sore throat, body chills, aches, productive cough, nasal drainage, onset Saturday Night, tylenol cold with some relief. c/o head and facial pain, denies fever at home   Primary Care Provider:  DDrema Pry DO  CC:  RM 2- C/o cough, chill and body aches, and URI symptoms.  History of Present Illness:  URI Symptoms      This is a 70 year old woman with hx of asthma who presents with URI symptoms.  The patient reports nasal congestion, sore throat, and productive cough.  Associated symptoms include low-grade fever (<100.5 degrees).  The patient also reports muscle aches and severe fatigue.    Allergies: 1)  ! Sulfa 2)  ! Ace Inhibitors 3)  ! 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   Ears:  R ear normal and L ear normal.   Mouth:  pharynx pink and moist.   Neck:  No  deformities, masses, or tenderness noted. Lungs:  normal respiratory effort.     Impression & Recommendations:  Problem # 1:  INFLUENZA (ICD-487.8)  Pt with hx of asthma with influenza B.  she is having asthma flare.  use tamiflu.  use ceftin.  refilled proair. Patient advised to call office if symptoms persist or worsen.  Orders: Prescription Created Electronically [phone])  Complete Medication List: 1)  Cozaar 100 Mg Tabs (Losartan potassium) .... Take 1 tablet by mouth once a day 2)  Metformin Hcl 500 Mg Tb24 (Metformin hcl) .... Take 1 tablet by mouth two times a day 3)  Pravastatin Sodium 20 Mg Tabs (Pravastatin sodium) .... Take 1 tab by mouth at bedtime 4)  Prilosec 20 Mg Cpdr (Omeprazole) .... Take 1 capsule by mouth twice a day 5)  Proair Hfa 108 (90 Base) Mcg/act Aers (Albuterol sulfate) .... 2 puffs q 4 hrs prn 6)  Fosamax 70 Mg Tabs (Alendronate sodium) .... One by mouth qwkly 7)  Sertraline Hcl 50 Mg Tabs (Sertraline hcl) .... Take 1 tablet by mouth once a day 8)  Allopurinol 100 Mg Tabs (Allopurinol) .... 2 tabs by mouth  qd 9)  Cetirizine Hcl 10 Mg Tabs (Cetirizine hcl) .... One by mouth qd 10)  Symbicort 160-4.5 Mcg/act Aero (Budesonide-formoterol fumarate) .... 2 puffs two times a day 11)  Tamiflu 75 Mg Caps (Oseltamivir phosphate) .... One by mouth two times a day 12)  Cefuroxime Axetil 500 Mg Tabs (Cefuroxime axetil) .... One by mouth two times a day  Patient Instructions: 1)  Call our office if your symptoms do not  improve or gets worse. Prescriptions: PROAIR HFA 108 (90 BASE) MCG/ACT AERS (ALBUTEROL SULFATE) 2 puffs q 4 hrs prn  #1 x 5   Entered and Authorized by:   D. Drema Pry DO   Signed by:   D. Drema Pry DO on 03/13/2009   Method used:   Electronically to        [facility] (retail)       [address]       [place], [place]  [address]       Ph: [phone]       Fax: [phone]   RxID:   [id number] CEFUROXIME  AXETIL 500 MG TABS (CEFUROXIME AXETIL) one by mouth two times a day  #14 x 0   Entered and Authorized by:   D. Drema Pry DO   Signed by:   D. Drema Pry DO on 03/13/2009   Method used:   Electronically to        [facility] (retail)       [address]       [place], [place]  [address]       Ph: [phone]       Fax: [phone]   RxID:   [id number] TAMIFLU 75 MG CAPS (OSELTAMIVIR PHOSPHATE) one by mouth two times a day  #10 x 0   Entered and Authorized by:   D. Drema Pry DO   Signed by:   D. Drema Pry DO on 03/13/2009   Method used:   Electronically to        [facility] (retail)       [address]       [place], [place]  [address]       Ph: [phone]       Fax: [phone]   RxID:   [id number]   Current Allergies (reviewed today): ! SULFA ! ACE INHIBITORS ! INDOCIN

## 2010-02-19 NOTE — Assessment & Plan Note (Signed)
Summary: 2 MONTH FOLLOW UP/MHF   Vital Signs:  Patient profile:   70 year old Byrd Weight:      221 pounds O2 Sat:      95 % Temp:     98.5 degrees F Pulse rate:   85 / minute Pulse rhythm:   regular  Primary Care Provider:  Jennings Books DO   History of Present Illness: [name] with asthma for follow up. at prev visit, pt diagnosed with influenza.  she could not tolerate tamiflu it caused nausea, vomiting, diarrhea and dizziness she finished ceftin still has persistent cough and chest tightness dyspnea with exertion   Allergies: 1)  ! Sulfa 2)  ! Ace Inhibitors 3)  ! 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   Head:  normocephalic and atraumatic.   Ears:  R ear normal and L ear normal.   Mouth:  pharynx pink and moist.   Neck:  supple, no masses, and no neck tenderness.   Lungs:  normal respiratory effort.  bilateral exp wheeze Heart:  normal rate, regular rhythm, and no gallop.   Extremities:  trace left pedal edema and trace right pedal edema.     Impression & Recommendations:  Problem # 1:  ASTHMATIC BRONCHITIS, ACUTE (ICD-466.0) 70 y/o with persistent cough/bronchitis.  use avelox and prednisone taper. Patient advised to call office if symptoms  persist or worsen.  IMPRESSION: Mild cardiomegaly and stable central airway thickening - no evidence for hyperaeration or active disease.    The following medications were removed from the medication list:    Cefuroxime Axetil 500 Mg Tabs (Cefuroxime axetil) ..... One by mouth two times a day Her updated medication list for this problem includes:    Proair Hfa 108 (90 Base) Mcg/act Aers (Albuterol sulfate) .Marland Kitchen... 2 puffs q 4 hrs prn    Symbicort 160-4.5 Mcg/act Aero (Budesonide-formoterol fumarate) .Marland Kitchen... 2 puffs two times a day    Avelox 400 Mg Tabs (Moxifloxacin hcl) ..... One by mouth once daily  Orders: T-2 View CXR, Same Day ([address].[place]) Misc. Referral (Misc. Ref)  Complete Medication List: 1)  Cozaar 100 Mg Tabs (Losartan potassium) .... Take 1 tablet by mouth once a day 2)  Metformin Hcl 500 Mg Tb24 (Metformin hcl) .... Take 1 tablet by mouth two times a day 3)  Pravastatin Sodium 20 Mg Tabs (Pravastatin sodium) .... Take 1 tab by mouth at bedtime 4)  Prilosec 20 Mg Cpdr (Omeprazole) .... Take 1 capsule by mouth twice a day 5)  Proair Hfa 108 (90 Base) Mcg/act Aers (Albuterol sulfate) .... 2 puffs q 4 hrs prn 6)  Fosamax 70 Mg Tabs (  Alendronate sodium) .... One by mouth qwkly 7)  Sertraline Hcl 50 Mg Tabs (Sertraline hcl) .... Take 1 tablet by mouth once a day 8)  Allopurinol 100 Mg Tabs (Allopurinol) .... 2 tabs by mouth qd 9)  Cetirizine Hcl 10 Mg Tabs (Cetirizine hcl) .... One by mouth qd 10)  Symbicort 160-4.5 Mcg/act Aero (Budesonide-formoterol fumarate) .... 2 puffs two times a day 11)  Avelox 400 Mg Tabs (Moxifloxacin hcl) .... One by mouth once daily 12)  Prednisone 10 Mg Tabs (Prednisone) .... 4 tabs by mouth q.d. x 3 days, 3 tabs by mouth q.d. x 3 days, 2 tabs p.o. once daily x 3 days, 1 tab p.o. x 3 days  Patient Instructions: 1)  Please schedule a follow-up appointment in 1 month. 2)  Call our office if your symptoms do not  improve or gets  worse. Prescriptions: PREDNISONE 10 MG TABS (PREDNISONE) 4 tabs by mouth q.d. x 3 days, 3 tabs by mouth q.d. x 3 days, 2 tabs p.o. once daily x 3 days, 1 tab p.o. x 3 days  #30 x 0   Entered and Authorized by:   D. Drema Pry DO   Signed by:   D. Drema Pry DO on 03/26/2009   Method used:   Electronically to        [facility] (retail)       [address]       [place], [place]  [address]       Ph: [phone]       Fax: [phone]   RxID:   [id number] AVELOX 400 MG TABS (MOXIFLOXACIN HCL) one by mouth once daily  #10 x 0   Entered and Authorized by:   D. Drema Pry DO   Signed by:   D. Drema Pry DO on 03/26/2009   Method used:   Electronically to        [facility] ([facility]ail)       [address]       [place], [place]  [address]       Ph: [phone]       Fax: [phone]   RxID:   [id number]

## 2010-02-19 NOTE — Assessment & Plan Note (Signed)
Summary: 4 month follow up/mhf   Vital Signs:  Patient profile:   70 year old female Weight:      219.50 pounds BMI:     36.66 O2 Sat:      96 % on Room air Temp:     98.1 degrees F oral Pulse rate:   88 / minute Pulse rhythm:   regular Resp:     22 per minute BP sitting:   134 / 70  (right arm) Cuff size:   large  Vitals Entered By: Jiles Garter CMA (January 29, 2009 3:15 PM)  O2 Flow:  Room air  Primary Care Provider:  D. Drema Pry DO  CC:  4 Month Follow up  and asthma.  History of Present Illness: 4 Month follow up disease management       This is a 70 year old female who presents with asthma.  The patient complains of wheezing and mucous production.  Previous effective treatment includes inhaled corticosteroids.  symptoms worse over last one month  Htn - stable.  good med compliance.  lab results reviewed  Hyperlipidemia - stable  Allergies: 1)  ! Sulfa 2)  ! Ace Inhibitors 3)  ! 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  Physical Exam  General:  alert, well-developed, and well-nourished.   Lungs:  normal respiratory effort.  scattered faint wheezing Heart:  normal rate, regular rhythm, and no gallop.   Extremities:  trace left pedal edema and trace right pedal edema.     Impression &  Recommendations:  Problem # 1:  ASTHMA (ICD-493.90) she notes increased mucus production and wheezing.   change qvar to symbicort.  repeat PFT's.  she reports taking omeprazole regularly.  The following medications were removed from the medication list:    Qvar 40 Mcg/act Aers (Beclomethasone dipropionate) .Marland Kitchen... 2 puffs two times a day Her updated medication list for this problem includes:    Proventil Hfa 108 (90 Base) Mcg/act Aers (Albuterol sulfate) .Marland Kitchen... 2 puffs q 4-6 hrs prn    Symbicort 160-4.5 Mcg/act Aero (Budesonide-formoterol fumarate) .Marland Kitchen... 2 puffs two times a day  Orders: Pulmonary Referral (Pulmonary)  Problem # 2:  HYPERTENSION (ICD-401.9) stable.  Maintain current medication regimen.  Her updated medication list for this problem includes:    Cozaar 100 Mg Tabs (Losartan potassium) .Marland Kitchen... Take 1 tablet by mouth once a day  BP today: 134/70 Prior BP: 136/68 (10/06/2008)  Labs Reviewed: K+: 4.1 (07/07/2008) Creat: : 1.1 (07/07/2008)   Chol: 177 (01/10/2008)   HDL: 57.0 (01/10/2008)   LDL: 96 (01/10/2008)   TG: 120 (01/10/2008)  Problem # 3:  HYPERLIPIDEMIA (ICD-272.4) stable.  Maintain current medication regimen.  Her updated medication list for this problem  includes:    Pravastatin Sodium 20 Mg Tabs (Pravastatin sodium) .Marland Kitchen... Take 1 tab by mouth at bedtime  Labs Reviewed: SGOT: 18 (01/10/2008)   SGPT: 15 (01/10/2008)   HDL:57.0 (01/10/2008), 44.4 (12/29/2006)  LDL:96 (01/10/2008), 86 (12/29/2006)  Chol:177 (01/10/2008), 160 (12/29/2006)  Trig:120 (01/10/2008), 148 (12/29/2006)  Complete Medication List: 1)  Cozaar 100 Mg Tabs (Losartan potassium) .... Take 1 tablet by mouth once a day 2)  Metformin Hcl 500 Mg Tb24 (Metformin hcl) .... Take 1 tablet by mouth two times a day 3)  Pravastatin Sodium 20 Mg Tabs (Pravastatin sodium) .... Take 1 tab by mouth at bedtime 4)  Prilosec 20 Mg Cpdr (Omeprazole) .... Take 1 capsule by mouth twice a day 5)  Proventil Hfa 108  (90 Base) Mcg/act Aers (Albuterol sulfate) .... 2 puffs q 4-6 hrs prn 6)  Fosamax 70 Mg Tabs (Alendronate sodium) .... One by mouth qwkly 7)  Sertraline Hcl 50 Mg Tabs (Sertraline hcl) .... Take 1 tablet by mouth once a day 8)  Allopurinol 100 Mg Tabs (Allopurinol) .... 2 tabs by mouth qd 9)  Cetirizine Hcl 10 Mg Tabs (Cetirizine hcl) .... One by mouth qd 10)  Symbicort 160-4.5 Mcg/act Aero (Budesonide-formoterol fumarate) .... 2 puffs two times a day 11)  Azithromycin 250 Mg Tabs (Azithromycin) .... 2 tabs on day one, then one by mouth once daily x 4 days  Patient Instructions: 1)  Please schedule a follow-up appointment in 2 months 2)  Call our office if your symptoms do not  improve or gets worse. Prescriptions: AZITHROMYCIN 250 MG TABS (AZITHROMYCIN) 2 tabs on day one, then one by mouth once daily x 4 days  #6 x 0   Entered and Authorized by:   D. Drema Pry DO   Signed by:   D. Drema Pry DO on 01/29/2009   Method used:   Electronically to        [facility] (retail)       [address]       [place], [place]  [address]       Ph: [phone]       Fax: [phone]   RxID:   [id number] SYMBICORT 160-4.5 MCG/ACT AERO (BUDESONIDE-FORMOTEROL FUMARATE) 2 puffs two times a day  #1 x 2   Entered and Authorized by:   D. Drema Pry DO   Signed by:   D. Drema Pry DO on 01/29/2009   Method used:   Electronically to        [facility] (retail)       [address]       [place], [place]  [address]       Ph: [phone]       Fax: [phone]   RxID:   [id number]   Current Allergies (reviewed today): ! SULFA ! ACE INHIBITORS ! INDOCIN   Preventive Care Screening  Last Flu Shot:    Date:  11/14/2008    Results:  given

## 2010-02-19 NOTE — Medication Information (Signed)
Summary: Letter Regarding Sertraline/[facility]  Letter Regarding Sertraline/[facility]   Imported By: Edmonia James 10/23/2009 10:28:50  _____________________________________________________________________  External Attachment:    Type:   Image     Comment:   External Document

## 2010-02-19 NOTE — Progress Notes (Signed)
Summary: Medication Change  Phone Note Outgoing Call   Summary of Call: call pt - blood test shows pt taking too much lasix.  take 1/2 of 40 mg every other day.  Hold metformin until instructed to resume. needs repeat BMET in 1 month -  401.9 Initial call taken by: D. Drema Pry DO,  Jun 12, 2009 5:31 PM  Follow-up for Phone Call        attempted to contact patient at [phone], no answer, voice message left for patient to return call regarding medication changes Follow-up by: Jiles Garter CMA,  Jun 12, 2009 5:34 PM  Additional Follow-up for Phone Call Additional follow up Details #1::        attempted to contact patient at [phone], no answer,voice message left for patient to return call regarding medication changes. Additional Follow-up by: Jiles Garter CMA,  Jun 13, 2009 9:06 AM    Additional Follow-up for Phone Call Additional follow up Details #2::    patient returned call and left voice message stating she can be reached at [phone] or her home number [phone] all day today.   Call was returned to patient at [phone], she was advised per Dr Shawna Orleans instructions Follow-up by: Jiles Garter CMA,  Jun 14, 2009 8:28 AM  New/Updated Medications: FUROSEMIDE 40 MG TABS (FUROSEMIDE) one half tab every other day METFORMIN HCL 500 MG  TB24 (METFORMIN HCL) Take 1 tablet by mouth two times a day  (HOLD)

## 2010-02-19 NOTE — Assessment & Plan Note (Signed)
Summary: 1 MONTH FOLLOW UP/MHF   Vital Signs:  Patient profile:   70 year old female Weight:      216.50 pounds BMI:     36.16 O2 Sat:      93 % on Room air Temp:     98.2 degrees F oral Pulse rhythm:   regular Resp:     24 per minute BP sitting:   139 / 83  (left arm)  Vitals Entered By: Jiles Garter CMA (Jun 11, 2009 3:21 PM)  O2 Flow:  Room air CC: Rm 2- 1 Month Follow up disease management   Primary Care Provider:  DDrema Pry DO  CC:  Rm 2- 1 Month Follow up disease management.  History of Present Illness: 70 y/o white female for follow up overall resp status has improved less dyspnea pulm notes reviewed   Left ventricle: The cavity size was normal. Wall thickness was     increased in a pattern of mild LVH. Systolic function was normal.     The estimated ejection fraction was 55%. Wall motion was normal;     there were no regional wall motion abnormalities. Doppler parameters     are consistent with abnormal left ventricular relaxation (grade 1     diastolic dysfunction).  Tricuspid valve:  Doppler: Trivial regurgitation.            --------------------------------------------------------------------     Pulmonary artery: No complete TR doppler jet, unable to estimate PA     systolic pressure.   Allergies: 1)  ! Sulfa 2)  ! Ace Inhibitors 3)  ! Indocin 4)  ! Singulair (Montelukast Sodium) 5)  Tamiflu (Oseltamivir Phosphate)  Past History:  Past Medical History: Asthma       - PFT 02/06/09 FEV1 1.41(65%), FVC 1.92(64), FEV1% 74, TLC 3.47(71%), DLCO 48%, +BD ACE inhibitor cough DVT, hx of  1987     GERD   Osteoarthritis    Atypical chest pain S/P cath      Normal coronaries    Non ST elevation myocarial infarction    Rt groin pseudoaneurysm   Hyperlipidemia Hypertension Depression   Gout Abnormal glucose tolerance  Family History: Family History of CAD Female 1st degree relative <60 Father deceased age 97 secondary to suicide 3 brothers  with CAD 3 sisters with CAD and asthma              Physical Exam  General:  alert, well-developed, and well-nourished.   Lungs:  normal respiratory effort, normal breath sounds, no crackles, and no wheezes.   Heart:  normal rate, regular rhythm, and no gallop.   Extremities:  trace left pedal edema and trace right pedal edema.     Impression & Recommendations:  Problem # 1:  ASTHMA (ICD-493.90) Assessment Improved Seen by Pulm - Dr. Halford Chessman.  less dyspnea.  allergy panel was negative.  continue maintenace inhalers The following medications were removed from the medication list:    Singulair 10 Mg Tabs (Montelukast sodium) ..... One by mouth at bedtime Her updated medication list for this problem includes:    Spiriva Handihaler 18 Mcg Caps (Tiotropium bromide monohydrate) ..... One puff once daily    Symbicort 160-4.5 Mcg/act Aero (Budesonide-formoterol fumarate) .Marland Kitchen... 2 puffs two times a day    Proair Hfa 108 (90 Base) Mcg/act Aers (Albuterol sulfate) .Marland Kitchen... 2 puffs q 4 hrs prn  Problem # 2:  HYPERTENSION (ICD-401.9) respiratory improvement may be partly attributable to lasix.  increase dose to 40 mg  Her updated medication list for this problem includes:    Furosemide 40 Mg Tabs (Furosemide) ..... One half tab every other day    Cozaar 100 Mg Tabs (Losartan potassium) .Marland Kitchen... Take 1 tablet by mouth once a day  Orders: T-Basic Metabolic Panel (99991111)  Complete Medication List: 1)  Spiriva Handihaler 18 Mcg Caps (Tiotropium bromide monohydrate) .... One puff once daily 2)  Symbicort 160-4.5 Mcg/act Aero (Budesonide-formoterol fumarate) .... 2 puffs two times a day 3)  Cetirizine Hcl 10 Mg Tabs (Cetirizine hcl) .... One by mouth qd 4)  Fluticasone Propionate 50 Mcg/act Susp (Fluticasone propionate) .... 2 sprays each nostril once daily 5)  Proair Hfa 108 (90 Base) Mcg/act Aers (Albuterol sulfate) .... 2 puffs q 4 hrs prn 6)  Furosemide 40 Mg Tabs (Furosemide) .... One half tab  every other day 7)  Cozaar 100 Mg Tabs (Losartan potassium) .... Take 1 tablet by mouth once a day 8)  Pravastatin Sodium 20 Mg Tabs (Pravastatin sodium) .... Take 1 tab by mouth at bedtime 9)  Metformin Hcl 500 Mg Tb24 (Metformin hcl) .... Take 1 tablet by mouth two times a day  (hold) 10)  Pantoprazole Sodium 40 Mg Tbec (Pantoprazole sodium) .... One by mouth two times a day 11)  Sertraline Hcl 50 Mg Tabs (Sertraline hcl) .... Take 1 tablet by mouth once a day 12)  Fosamax 70 Mg Tabs (Alendronate sodium) .... One by mouth qwkly 13)  Allopurinol 100 Mg Tabs (Allopurinol) .... 2 tabs by mouth qd 14)  Klor-con M20 20 Meq Cr-tabs (Potassium chloride crys cr) .... One by mouth once daily  Patient Instructions: 1)  Please schedule a follow-up appointment in 1 month. 2)  BMP prior to visit, ICD-9: 401.9 3)  Please return for lab work one (1) week before your next appointment.  Prescriptions: KLOR-CON M20 20 MEQ CR-TABS (POTASSIUM CHLORIDE CRYS CR) one by mouth once daily  #30 x 5   Entered and Authorized by:   D. Drema Pry DO   Signed by:   D. Drema Pry DO on 06/11/2009   Method used:   Electronically to        [facility] (retail)       [address]       [place], [place]  [address]       Ph: [phone]       Fax: [phone]   RxID:   [id number] FUROSEMIDE 40 MG TABS (FUROSEMIDE) one by mouth once daily  #30 x 3   Entered and Authorized by:   D. Drema Pry DO   Signed by:   D. Drema Pry DO on 06/11/2009   Method used:   Electronically to        [facility] [address] (retail)       [address]       [place], [place]  [address]       Ph: [phone]       Fax: [phone]   RxID:   [id number]   Current Allergies (reviewed today): ! SULFA ! ACE INHIBITORS ! INDOCIN ! SINGULAIR (MONTELUKAST SODIUM) TAMIFLU (OSELTAMIVIR PHOSPHATE)

## 2010-02-19 NOTE — Miscellaneous (Signed)
Summary: BONE DENSITY  Clinical Lists Changes  Orders: Added new Test order of T-Bone Densitometry (77080) - Signed Added new Test order of T-Lumbar Vertebral Assessment (77082) - Signed 

## 2010-02-19 NOTE — Assessment & Plan Note (Signed)
Summary: fu CT scan/dt   Vital Signs:  Patient profile:   70 year old female Height:      65 inches Weight:      216 pounds BMI:     36.07 O2 Sat:      96 % on Room air Temp:     97.8 degrees F oral Pulse rate:   95 / minute BP sitting:   132 / 74  (right arm) Cuff size:   large  Vitals Entered By: Sherlean Foot CMA (May 17, 2009 1:21 PM)  O2 Flow:  Room air CC: F/U CT   Primary Care Provider:  Jennings Books DO  CC:  F/U CT.  History of Present Illness: 70 y/o white female for f/u re:  dyspnea pt less dyspneic she denies chest pain or calf tenderness we reviewed CT of chest. radiology unable to obtain IV access non contrast study showed possible PAH    Allergies: 1)  ! Sulfa 2)  ! Ace Inhibitors 3)  ! 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   Lungs:  normal respiratory effort and normal breath sounds.   Heart:  normal rate, regular rhythm, and no gallop.   Extremities:  trace left pedal edema and trace right pedal edema.     Impression & Recommendations:  Problem # 1:  DYSPNEA (ICD-786.05) CT of chest (non contrast) shows:  Prominent caliber of the main and central pulmonary arteries.  Main pulmonary artery trunk measures 3.8 cm in  transverse dimension.  This is compatible with pulmonary arterial hypertension.    obtain 2D Echo to assess for pulm htn obtain VQ scan - consider chronic thromboembolic dz add lasix  refer to pulm for further eval Orders: Radiology Referral (Radiology) Echo Referral (Echo) Pulmonary Referral (Pulmonary)  Complete Medication List: 1)  Cozaar 100 Mg Tabs (Losartan potassium) .... Take 1 tablet by mouth once a day 2)  Metformin Hcl 500 Mg Tb24 (Metformin hcl) .... Take 1 tablet by mouth two times a day 3)  Pravastatin Sodium 20 Mg Tabs (Pravastatin sodium) .... Take 1 tab by mouth at bedtime 4)  Pantoprazole Sodium 40 Mg Tbec (Pantoprazole sodium) .... One by mouth two times a day 5)  Proair Hfa 108 (90 Base) Mcg/act Aers (Albuterol sulfate) .... 2 puffs q 4 hrs prn 6)  Fosamax 70 Mg Tabs (Alendronate sodium) .... One by mouth qwkly 7)  Sertraline Hcl 50 Mg Tabs (Sertraline hcl) .... Take 1 tablet by mouth once a day 8)  Allopurinol 100 Mg Tabs (Allopurinol) .... 2 tabs by mouth qd 9)  Cetirizine Hcl 10 Mg Tabs (Cetirizine hcl) .... One by mouth qd 10)  Symbicort 160-4.5 Mcg/act Aero (Budesonide-formoterol fumarate) .... 2 puffs two times a day 11)  Fluticasone Propionate 50 Mcg/act Susp (Fluticasone propionate) .... 2 sprays each nostril once daily 12)  Furosemide 20 Mg Tabs (Furosemide) .... One by mouth qd  Patient Instructions: 1)  Please schedule a follow-up appointment in 1 month 2)  BMP prior to visit, ICD-9:  401.9 3)  Please return for lab work one (1) week before your next appointment.  Prescriptions: FUROSEMIDE 20 MG TABS (FUROSEMIDE) one by mouth qd  #30 x 3   Entered and Authorized by:   D. Drema Pry DO   Signed by:   D. Drema Pry DO on 05/17/2009   Method used:   Electronically to        [facility] (retail)       [address]       [place], [place]  [address]       Ph: [phone]       Fax: [phone]   RxID:    [id number]   Appended Document: Orders Update    Clinical Lists Changes  Orders: Added new Referral order of Radiology Referral (Radiology) - Signed

## 2010-02-21 NOTE — Assessment & Plan Note (Signed)
Summary: FOLLOW UP FROM BRONCHITIS/MHF   Vital Signs:  Patient profile:   70 year old female Height:      65 inches Weight:      223.50 pounds BMI:     37.33 O2 Sat:      97 % on Room air Temp:     98.3 degrees F oral Pulse rate:   77 / minute Resp:     22 per minute BP sitting:   126 / 80  (right arm) Cuff size:   large  Vitals Entered By: Jiles Garter CMA (January 22, 2010 8:50 AM)  O2 Flow:  Room air CC: follow-up visit Is Patient Diabetic? No Pain Assessment Patient in pain? no      Comments medication refills to [facility], follow up from bronchitis- improved, not monitoring blood sugars regular,    Primary Care Provider:  Jennings Books DO  CC:  follow-up visit.  History of Present Illness: 70 y/o for follow up re:  bronchitis  she is feeling much better - less cough no fever,  no SOB pt txed with prednisone and z pak does not have glucometer to check her blood sugars she requests rx for albuterol nebs to use as needed  Preventive Screening-Counseling & Management  Alcohol-Tobacco     Smoking Status: never  Allergies: 1)  ! Sulfa 2)  ! Ace Inhibitors 3)  ! Indocin 4)  ! Singulair (Montelukast Sodium) 5)  Tamiflu (Oseltamivir Phosphate)  Past History:  Past Medical History: Asthma       - PFT 02/06/09 FEV1 1.41(65%), FVC 1.92(64), FEV1% 74, TLC 3.47(71%), DLCO 48%, +BD ACE inhibitor cough DVT, hx of  1987       GERD   Osteoarthritis    Atypical chest pain S/P cath      Normal coronaries    Non ST elevation myocarial infarction    Rt groin pseudoaneurysm   Hyperlipidemia Hypertension Depression   Gout Abnormal glucose tolerance  Past Surgical History: Appendectomy 1951 Tubal ligation 1968 Tonsillectomy 1950         Family History: Family History of CAD Female 1st degree relative <60 Father deceased age 31 secondary to suicide 3 brothers with CAD 3 sisters with CAD and asthma                Social History: Occupation: Editor, 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   Ears:  R ear normal and L ear normal.   Mouth:  pharynx pink and moist.   Lungs:  normal respiratory effort, normal breath sounds, and no wheezes.   Heart:  normal rate, regular rhythm, and no gallop.     Impression & Recommendations:  Problem # 1:  ASTHMA (ICD-493.90) Assessment Improved recent bronchitis resolved pt requests rx for albuterol nebs to use as needed refill maintenance inhalers  The following medications were removed from the medication list:    Prednisone 10 Mg Tabs (Prednisone) .Marland Kitchen... Take 4 tablets daily x 2 days, then 3 tabs daily x 2 days, then 2 tabs daily x 2 days, then 1 tab daily for 2 days then stop. Her updated medication list for this problem includes:    Spiriva Handihaler 18 Mcg Caps (Tiotropium bromide monohydrate) ..... One puff once daily    Symbicort 160-4.5 Mcg/act Aero (Budesonide-formoterol fumarate) .Marland KitchenMarland KitchenMarland KitchenMarland Kitchen  2 puffs two times a day    Proair Hfa 108 (90 Base) Mcg/act Aers (Albuterol sulfate) .Marland Kitchen... 2 puffs q 4 hrs prn    Albuterol Sulfate 0.63 Mg/34ml Nebu (Albuterol sulfate) ..... Use qid as needed  Problem # 2:  GERD (ICD-530.81) Assessment: Unchanged  Her updated medication list for this problem includes:    Pantoprazole Sodium 40 Mg Tbec (Pantoprazole sodium) ..... One by mouth two times a day  Labs Reviewed: Hgb: 11.4 (04/23/2009)   Hct: 35.7 (04/23/2009)  Problem # 3:  HYPERTENSION (ICD-401.9) Assessment: Unchanged well controlled  Her updated medication list for this problem includes:    Furosemide 40 Mg Tabs (Furosemide) ..... One half tab every other day    Losartan Potassium 50 Mg Tabs (Losartan potassium) ..... One by mouth once daily  BP today: 126/80 Prior BP: 110/70 (01/15/2010)  Prior 10 Yr Risk Heart Disease: 11 % (04/23/2009)  Labs Reviewed: K+: 5.1  (07/09/2009) Creat: : 1.4 (07/09/2009)   Chol: 157 (01/29/2009)   HDL: 46.80 (01/29/2009)   LDL: 83 (01/29/2009)   TG: 137.0 (01/29/2009)  Complete Medication List: 1)  Spiriva Handihaler 18 Mcg Caps (Tiotropium bromide monohydrate) .... One puff once daily 2)  Symbicort 160-4.5 Mcg/act Aero (Budesonide-formoterol fumarate) .... 2 puffs two times a day 3)  Cetirizine Hcl 10 Mg Tabs (Cetirizine hcl) .... One by mouth qd 4)  Fluticasone Propionate 50 Mcg/act Susp (Fluticasone propionate) .... 2 sprays each nostril once daily 5)  Proair Hfa 108 (90 Base) Mcg/act Aers (Albuterol sulfate) .... 2 puffs q 4 hrs prn 6)  Furosemide 40 Mg Tabs (Furosemide) .... One half tab every other day 7)  Losartan Potassium 50 Mg Tabs (Losartan potassium) .... One by mouth once daily 8)  Pravastatin Sodium 20 Mg Tabs (Pravastatin sodium) .... Take 1 tab by mouth at bedtime 9)  Pantoprazole Sodium 40 Mg Tbec (Pantoprazole sodium) .... One by mouth two times a day 10)  Sertraline Hcl 50 Mg Tabs (Sertraline hcl) .... Take 1 tablet by mouth once a day 11)  Fosamax 70 Mg Tabs (Alendronate sodium) .... One by mouth qwkly 12)  Allopurinol 100 Mg Tabs (Allopurinol) .... 2 tabs by mouth qd 13)  Klor-con M20 20 Meq Cr-tabs (Potassium chloride crys cr) .... One by mouth once daily 14)  Albuterol Sulfate 0.63 Mg/69ml Nebu (Albuterol sulfate) .... Use qid as needed 15)  Onetouch Test Strp (Glucose blood) .... Use to check blood sugar once weekly (790.29)  Patient Instructions: 1)  Please schedule a follow-up appointment in 6 months. 2)  The highlighted prescriptions were electronically sent to your pharmacy Prescriptions: ONETOUCH TEST  STRP (GLUCOSE BLOOD) use to check blood sugar once weekly (790.29)  #50 x 1   Entered by:   Jiles Garter CMA   Authorized by:   D. Drema Pry DO   Signed by:   Jiles Garter CMA on 01/22/2010   Method used:   Electronically to        [facility] (retail)       [address]       [place], [place]  [address]       Ph: [phone]       Fax: [phone]   RxID:   [id number] FUROSEMIDE 40 MG TABS (FUROSEMIDE) one half tab every other day  #45 x 1   Entered and Authorized by:   D. Drema Pry DO   Signed by:  Jennings Books DO on 01/22/2010   Method used:   Electronically to        [facility] (retail)       [address]       [place], [place]  [address]       Ph: [phone]       Fax: [phone]   RxID:   [id number] FOSAMAX 70 MG  TABS (ALENDRONATE SODIUM) one by mouth qwkly  #12 x 3   Entered and Authorized by:   D. Drema Pry DO   Signed by:   D. Drema Pry DO on 01/22/2010   Method used:   Electronically to        [facility] (retail)       [name]       [place], [place]  [address]       Ph: [phone]       Fax: [phone]   RxID:   [id number] LOSARTAN POTASSIUM 50 MG TABS (LOSARTAN POTASSIUM) one by mouth once daily  #30 x 5   Entered and Authorized by:   D. Drema Pry DO   Signed by:   D. Drema Pry DO on 01/22/2010   Method used:   Electronically to        [facility] (retail)       [address]       [place], [place]  [address]       Ph: [phone]       Fax: [phone]   RxID:   [id number] SPIRIVA HANDIHALER 18 MCG CAPS (TIOTROPIUM BROMIDE MONOHYDRATE) one puff once daily  #90 x 3   Entered and Authorized by:   D. Drema Pry DO   Signed by:   D. Drema Pry DO on 01/22/2010   Method used:   Electronically to        [facility] [address] (retail)       [address]       [place], [place]  [address]       Ph: [phone]       Fax: [phone]   RxID:   [id number] SYMBICORT 160-4.5 MCG/ACT AERO (BUDESONIDE-FORMOTEROL FUMARATE) 2 puffs two times a day  #3 month x 3   Entered and Authorized by:   D. Drema Pry DO   Signed by:   D. Drema Pry DO on 01/22/2010    Method used:   Electronically to        [facility] [address] (retail)       [address]       [place], [place]  [address]       Ph: [phone]       Fax: [phone]   RxID:   [id number] ALBUTEROL SULFATE 0.63 MG/3ML NEBU (ALBUTEROL SULFATE) use qid as needed  #1 month x 3   Entered and Authorized by:   D. Drema Pry DO   Signed by:   D. Drema Pry DO on 01/22/2010   Method used:   Electronically to        [facility] (retail)       [address]  [place], [place]  [address]       Ph: [phone]       Fax: [phone]   RxID:   [id number]    Orders Added: 1)  Est. Patient Level III [id number]    Current Allergies (reviewed today): ! SULFA ! ACE INHIBITORS ! INDOCIN ! SINGULAIR (MONTELUKAST SODIUM) TAMIFLU (OSELTAMIVIR PHOSPHATE)

## 2010-02-21 NOTE — Assessment & Plan Note (Signed)
Summary: BRONCHITIS?/MHF   Vital Signs:  Patient profile:   70 year old female Height:      65 inches Weight:      224.50 pounds BMI:     37.49 O2 Sat:      93 % on Room air Temp:     98.7 degrees F oral Pulse rate:   76 / minute Resp:     20 per minute BP sitting:   110 / 70  (right arm) Cuff size:   large  Vitals Entered By: Jiles Garter CMA (January 15, 2010 9:45 AM)  O2 Flow:  Room air CC: Shortness of Breath Is Patient Diabetic? No Pain Assessment Patient in pain? no      Comments headache, productive cough  yellow in color, dizziness, onset Friday   Primary Care Provider:  Jennings Books DO  CC:  Shortness of Breath.  History of Present Illness: [name] is a 70 year old female who presents today with chief complaint of cough which is productive of yellow sputum. Onset of symptoms was 4 days ago.  Initially started with nasal congestion and sore throat, then moved to her chest. Symptoms are associated with HA, and shortness of breath.  Symptoms are not associated with fever.  She reports chronic lower extremity edema which is at baseline.  She has prior history of similar symptoms which she associates with her asthma- though notes that this is worse than her typical asthma flare.   Symptoms are improved by use of her albuterol nebulizer and her symbicort/spiriva.      Preventive Screening-Counseling & Management  Alcohol-Tobacco     Smoking Status: never  Allergies: 1)  ! Sulfa 2)  ! Ace Inhibitors 3)  ! Indocin 4)  ! Singulair (Montelukast Sodium) 5)  Tamiflu (Oseltamivir Phosphate)  Past History:  Past Medical History: Last updated: 07/09/2009 Asthma       - PFT 02/06/09 FEV1 1.41(65%), FVC 1.92(64), FEV1% 74, TLC 3.47(71%), DLCO 48%, +BD ACE inhibitor cough DVT, hx of  1987      GERD   Osteoarthritis    Atypical chest pain S/P cath      Normal coronaries    Non ST elevation myocarial infarction    Rt groin pseudoaneurysm     Hyperlipidemia Hypertension Depression   Gout Abnormal glucose tolerance  Past Surgical History: Last updated: 07/09/2009 Appendectomy 1951 Tubal ligation 1968 Tonsillectomy 1950        Review of Systems       Appetite is "not real good"  Physical Exam  General:  Overweight white female, awake, alert and in NAd Head:  Normocephalic and atraumatic without obvious abnormalities. No apparent alopecia or balding. Ears:  External ear exam shows no significant lesions or deformities.  Otoscopic examination reveals clear canals, tympanic membranes are intact bilaterally without bulging, retraction, inflammation or discharge. Hearing is grossly normal bilaterally. Mouth:  Upper denture plate, no pharyngeal erythema or exudates noted Neck:  No deformities, masses, or tenderness noted. Lungs:  Bibasilar crackles, L>R Heart:  Normal rate and regular rhythm. S1 and S2 normal without gallop, murmur, click, rub or other extra sounds. Extremities:  1+ left pedal edema and 1+ right pedal edema.   Psych:  Cognition and judgment appear intact. Alert and cooperative with normal attention span and concentration. No apparent delusions, illusions, hallucinations   Impression & Recommendations:  Problem # 1:  BRONCHITIS (ICD-490) Assessment New CXR is reviewed and is negative for pneumonia.  Will plan to treat  with zithromax for bronchitis.   Her updated medication list for this problem includes:    Spiriva Handihaler 18 Mcg Caps (Tiotropium bromide monohydrate) ..... One puff once daily    Symbicort 160-4.5 Mcg/act Aero (Budesonide-formoterol fumarate) .Marland Kitchen... 2 puffs two times a day    Proair Hfa 108 (90 Base) Mcg/act Aers (Albuterol sulfate) .Marland Kitchen... 2 puffs q 4 hrs prn    Zithromax Z-pak 250 Mg Tabs (Azithromycin) .Marland Kitchen... Take two tablet by mouth today, then one tablet by mouth daily for 4 more days  Orders: CXR- 2view (CXR)  Problem # 2:  ASTHMA (ICD-493.90) Assessment: Deteriorated While  patient is not overtly wheezing, symptoms are consistent with exacerbation of her asthma. Will plan to treat with prednisone. Continue spiriva/symbicort/proair as directed.  Pt to f/u with Dr. Shawna Orleans in 1 week.  Her updated medication list for this problem includes:    Spiriva Handihaler 18 Mcg Caps (Tiotropium bromide monohydrate) ..... One puff once daily    Symbicort 160-4.5 Mcg/act Aero (Budesonide-formoterol fumarate) .Marland Kitchen... 2 puffs two times a day    Proair Hfa 108 (90 Base) Mcg/act Aers (Albuterol sulfate) .Marland Kitchen... 2 puffs q 4 hrs prn    Prednisone 10 Mg Tabs (Prednisone) .Marland Kitchen... Take 4 tablets daily x 2 days, then 3 tabs daily x 2 days, then 2 tabs daily x 2 days, then 1 tab daily for 2 days then stop.  Complete Medication List: 1)  Spiriva Handihaler 18 Mcg Caps (Tiotropium bromide monohydrate) .... One puff once daily 2)  Symbicort 160-4.5 Mcg/act Aero (Budesonide-formoterol fumarate) .... 2 puffs two times a day 3)  Cetirizine Hcl 10 Mg Tabs (Cetirizine hcl) .... One by mouth qd 4)  Fluticasone Propionate 50 Mcg/act Susp (Fluticasone propionate) .... 2 sprays each nostril once daily 5)  Proair Hfa 108 (90 Base) Mcg/act Aers (Albuterol sulfate) .... 2 puffs q 4 hrs prn 6)  Furosemide 40 Mg Tabs (Furosemide) .... One half tab every other day 7)  Losartan Potassium 50 Mg Tabs (Losartan potassium) .... One by mouth once daily 8)  Pravastatin Sodium 20 Mg Tabs (Pravastatin sodium) .... Take 1 tab by mouth at bedtime 9)  Pantoprazole Sodium 40 Mg Tbec (Pantoprazole sodium) .... One by mouth two times a day 10)  Sertraline Hcl 50 Mg Tabs (Sertraline hcl) .... Take 1 tablet by mouth once a day 11)  Fosamax 70 Mg Tabs (Alendronate sodium) .... One by mouth qwkly 12)  Allopurinol 100 Mg Tabs (Allopurinol) .... 2 tabs by mouth qd 13)  Klor-con M20 20 Meq Cr-tabs (Potassium chloride crys cr) .... One by mouth once daily 14)  Zithromax Z-pak 250 Mg Tabs (Azithromycin) .... Take two tablet by mouth today,  then one tablet by mouth daily for 4 more days 15)  Prednisone 10 Mg Tabs (Prednisone) .... Take 4 tablets daily x 2 days, then 3 tabs daily x 2 days, then 2 tabs daily x 2 days, then 1 tab daily for 2 days then stop.  Patient Instructions: 1)  Call if you develop fever over 101, chest pain, or shortness of breath.   2)  Follow up with Dr. Shawna Orleans in 1 week.  Prescriptions: PREDNISONE 10 MG TABS (PREDNISONE) take 4 tablets daily x 2 days, then 3 tabs daily x 2 days, then 2 tabs daily x 2 days, then 1 tab daily for 2 days then stop.  #20 x 0   Entered and Authorized by:   Nance Pear FNP   Signed by:   Wellington Hampshire  Inda Castle FNP on 01/15/2010   Method used:   Electronically to        [facility] (retail)       [address]       [place], [place]  [address]       Ph: [phone]       Fax: [phone]   RxID:   [id number] ZITHROMAX Z-PAK 250 MG TABS (AZITHROMYCIN) take two tablet by mouth today, then one tablet by mouth daily for 4 more days  #1 pack x 0   Entered and Authorized by:   Nance Pear FNP   Signed by:   Nance Pear FNP on 01/15/2010   Method used:   Electronically to        [facility] (retail)       [address]       [place], [place]  [address]       Ph: [phone]       Fax: [phone]   RxID:   [id number]    Orders Added: 1)  CXR- 2view [CXR] 2)  Est. Patient Level IV [id number]   Immunization History:  Influenza Immunization History:    Influenza:  historical (10/30/2009)   Immunization History:  Influenza Immunization History:    Influenza:  Historical (10/30/2009)  Current Allergies (reviewed today): ! SULFA ! ACE INHIBITORS ! INDOCIN ! SINGULAIR (MONTELUKAST SODIUM) TAMIFLU (OSELTAMIVIR PHOSPHATE)

## 2010-03-16 ENCOUNTER — Emergency Department (HOSPITAL_COMMUNITY)
Admission: EM | Admit: 2010-03-16 | Discharge: 2010-03-16 | Disposition: A | Discharge status: Home / Self Care | Payer: MEDICARE | Attending: Emergency Medicine | Admitting: Emergency Medicine

## 2010-03-16 DIAGNOSIS — J45909 Unspecified asthma, uncomplicated: Secondary | ICD-10-CM | POA: Insufficient documentation

## 2010-03-16 DIAGNOSIS — S40019A Contusion of unspecified shoulder, initial encounter: Secondary | ICD-10-CM | POA: Insufficient documentation

## 2010-03-16 DIAGNOSIS — S335XXA Sprain of ligaments of lumbar spine, initial encounter: Secondary | ICD-10-CM | POA: Insufficient documentation

## 2010-03-16 DIAGNOSIS — W1809XA Striking against other object with subsequent fall, initial encounter: Secondary | ICD-10-CM | POA: Insufficient documentation

## 2010-03-16 DIAGNOSIS — I10 Essential (primary) hypertension: Secondary | ICD-10-CM | POA: Insufficient documentation

## 2010-03-16 DIAGNOSIS — E119 Type 2 diabetes mellitus without complications: Secondary | ICD-10-CM | POA: Insufficient documentation

## 2010-03-16 DIAGNOSIS — IMO0002 Reserved for concepts with insufficient information to code with codable children: Secondary | ICD-10-CM | POA: Insufficient documentation

## 2010-03-22 ENCOUNTER — Telehealth: Payer: Self-pay | Admitting: Internal Medicine

## 2010-03-28 NOTE — Progress Notes (Signed)
Summary: refill-sertraline   Phone Note Refill Request Message from:  Fax from Pharmacy on March 22, 2010 4:10 PM  Refills Requested: Medication #1:  SERTRALINE HCL 50 MG  TABS Take 1 tablet by mouth once a day   Dosage confirmed as above?Dosage Confirmed   Brand Name Necessary? No   Supply Requested: 3 months [facility] [address] [place], [place] [address] fax [phone]   Method Requested: Electronic Next Appointment Scheduled: none Initial call taken by: Trenton Gammon,  March 22, 2010 4:12 PM  Follow-up for Phone Call        Rx completed in Dr. Brantley Stage Follow-up by: Jiles Garter CMA,  March 22, 2010 4:20 PM    Prescriptions: SERTRALINE HCL 50 MG  TABS (SERTRALINE HCL) Take 1 tablet by mouth once a day  #90 x 0   Entered by:   Jiles Garter CMA   Authorized by:   D. Drema Pry DO   Signed by:   Jiles Garter CMA on 03/22/2010   Method used:   Electronically to        [facility] (retail)       [address]       [place], [place]  [address]       Ph: [phone]       Fax: [phone]   RxID:   [id number]

## 2010-06-04 ENCOUNTER — Encounter: Payer: Self-pay | Admitting: Internal Medicine

## 2010-06-04 NOTE — Cardiovascular Report (Signed)
[name], STAR                ACCOUNT NO.:  [id number]   MEDICAL RECORD NO.:  [id number]          PATIENT TYPE:  INP   LOCATION:  [id number]                         FACILITY:  [facility]   PHYSICIAN:  Minus Breeding, MD, FACCDATE OF BIRTH:  Nov 23, 1940   DATE OF PROCEDURE:  09/14/2006  DATE OF DISCHARGE:                            CARDIAC CATHETERIZATION   PRIMARY:  Sandy Salaam. Shawna Orleans, DO   CARDIOLOGIST:  Pervis Hocking   PROCEDURE:  Left heart catheterization/coronary angiography.   INDICATION:  Evaluate patient with mildly elevated troponins and a  transient T wave inversion in the anterior leads.   PROCEDURAL NOTE:  Left heart catheterization was performed via the right  femoral artery, the vessel was cannulated using an anterior wall  puncture.  A #6 French arterial sheath was inserted via the modified  Seldinger technique.  Preformed Judkins and pigtail catheter were  utilized.  The patient tolerated the procedure well and left the lab in  stable condition.   RESULTS:   HEMODYNAMICS:  LV 129/23, AO 117/79.  Coronaries:  The left main was  normal.  The LAD had large and normal.  There was a first diagonal which  was normal.  The circumflex and the AV groove was normal.  First obtuse  marginal and second obtuse marginal was small and normal.  OM3 was large  and normal.  The posterolateral was large and normal.  The right  coronary artery was a dominant vessel and normal.  PDA was moderate size  and normal.   LEFT VENTRICULOGRAM:  A left ventriculogram was obtained in the RAO  projection.  The EF was 55% (this was somewhat difficult to assess with  ventricular ectopy but I did not appreciate any wall motion  abnormalities).   CONCLUSION:  1. Normal coronary arteries.  2. Well preserved ejection fraction.   PLAN:  The patient may have had some mild to myocarditis.  It cannot  exclude coronary spasm, but there is no indication of this.  She had a  mildly elevated D-dimer and mild  pericardial effusion.  She has some  patchy infiltrates on her chest x-ray.  This may have been a mild  myocarditis.  I would continue aspirin and have her followed up with  repeat echocardiograms and physical exams.      Minus Breeding, MD, [facility]  Electronically Signed     JH/MEDQ  D:  09/14/2006  T:  09/14/2006  Job:  [id number]   cc:   Thomas C. Wall, MD, [facility]

## 2010-06-04 NOTE — H&P (Signed)
[name], [name]                ACCOUNT NO.:  [id number]   MEDICAL RECORD NO.:  [id number]          PATIENT TYPE:  INP   LOCATION:  4738                         FACILITY:  [facility]   PHYSICIAN:  Sandy Salaam. Shawna Orleans, DO      DATE OF BIRTH:  August 27, 1940   DATE OF PROCEDURE:  DATE OF DISCHARGE:                    STAT - MUST CHANGE TO CORRECT WORK TYPE   CHIEF COMPLAINT:  Chest discomfort and shortness of breath.   HISTORY OF PRESENT ILLNESS:  The patient is a 70 year old white female  here with above complaints. She was seen in [facility] ER 2 days prior  to her presentation with chief complaints of headache. She awoke with a  severe headache on Wednesday evening. They performed a CT in the ER  which was negative. She also noted orthopnea and progressive dyspnea on  exertion. A D-dimer was ordered which was elevated which lead to a CT of  her chest. This was negative for pulmonary embolism but the radiologist  noted bilateral pulmonary ground-glass opacity that is nonspecific with  possibilities of pneumonitis, edema or bronchiolitis obliterans.   Since ER evaluation the patient is still having difficulty with  breathing and describes orthopnea. She also feels lightheaded and  fatigued. She does have some heaviness of her chest which is exertional.   PAST MEDICAL HISTORY:  1. History of right DVT in 1987.  2. History of asthma.  3. Abnormal glucose/prediabetes.  4. History of ACE inhibitor cough.  5. Hypertension.  6. Gout.  7. Hyperlipidemia.  8. Osteoarthritis.  9. Depression.   PAST SURGICAL HISTORY:  1. Status post appendectomy in 1951.  2. Status post tubal ligation in 1968.  3. Tonsillectomy in 1950.  4. Her last colonoscopy was in 2007.   CURRENT MEDICATIONS:  1. Prozac 20 mg once a day.  2. Omeprazole 20 mg orally b.i.d.  3. Pravastatin 20 mg at bedtime.  4. Metformin 850 mg b.i.d. with meals.  5. Cozaar 50 mg in a.m.   ALLERGIES:  TO MEDICATIONS INCLUDE  1. SULFA.  2. ACE INHIBITORS.  3. INDOCIN WHICH CAUSES DIZZINESS.   SOCIAL HISTORY:  The patient is widowed. Has 60 year old daughter. The  patient does not drink and no history of tobacco use in the past.   FAMILY HISTORY:  Mother deceased at age 73 secondary to coronary artery  disease. Father deceased at age 89 due to suicide. There are also  several siblings noted to have history of coronary artery disease.   REVIEW OF SYSTEMS:  As noted above. The patient has not noticed any  lower-extremity swelling. Her hydrochlorothiazide was discontinued  within the last 3 months due to exacerbations of gout.   PHYSICAL EXAMINATION:  VITAL SIGNS: Height is 5 feet 5 inches, weight is  208 pounds, temperature is 97.6, pulse is 89. BP is 126/76.  GENERAL: In general, this patient is a pleasant, obese 70 year old white  female in no apparent distress.  HEENT: Normocephalic, atraumatic. Pupils are equal and reactive to light  bilaterally. Extra-ocular motility was intact. The patient was  anicteric. Conjunctivae were within normal limits. Oropharyngeal exam  was unremarkable.  Mucous membranes were moist.  NECK: The neck was supple, no adenopathy or carotid bruit or  thyromegaly. Could not appreciate any JVD.  CHEST: Exam shows a normal expiratory effort. The chest was clear to  auscultation bilaterally. No rhonchi, no rales, no wheezing.  CARDIOVASCULAR: Regular rate and rhythm. No significant murmurs, rubs or  gallops appreciated.  ABDOMEN: The abdomen was protuberant, nontender, positive bowel sounds,  no organomegaly.  MUSCULOSKELETAL: No clubbing, cyanosis or edema.  SKIN: The skin was warm and dry.  NEUROLOGIC: Cranial nerves II through XII were grossly intact. She was  nonfocal.   IMPRESSION:  1. Chest pain.  2. Progressive dyspnea.  3. Hypertension.  4. Gastroesophageal reflux disease .  5. Abnormal glucose.  6. Strong family history of coronary artery disease.   RECOMMENDATIONS:  1. The  patient will be admitted for observation.  Obtain cardiology      consult.  2. Obtain a 2D echo and BNP.  We will cycle cardiac enzymes.  3. Hold metformin.  4. Start aspirin 325 mg once a day.      Sandy Salaam. Shawna Orleans, DO  Electronically Signed     RDY/MEDQ  D:  09/11/2006  T:  09/11/2006  Job:  [id number]

## 2010-06-04 NOTE — Consult Note (Signed)
NAMELOGANNE, [name]                ACCOUNT NO.:  [id number]   MEDICAL RECORD NO.:  [id number]          PATIENT TYPE:  INP   LOCATION:  4738                         FACILITY:  [facility]   PHYSICIAN:  Thomas C. Wall, MD, FACCDATE OF BIRTH:  November 08, 1940   DATE OF CONSULTATION:  09/11/2006  DATE OF DISCHARGE:                                 CONSULTATION   CHIEF COMPLAINT:  Chest tightness, shortness of breath over the past  couple of weeks.   HISTORY OF PRESENT ILLNESS:  [name] is a 70 year old, white female  who has had chest pressure and shortness of breath for the past several  weeks.  Tuesday evening, 3 days ago, she had chest tightness at rest.  It resolved after a couple of hours.   She has also had a mild headache and in the right frontal area.  She has  had no other focal neurological symptoms.   Her EKG on admission showed some T-wave inversion in anterior precordial  leads and her first troponin is 0.18.   She has no history of cardiac disease.  She has multiple cardiac risk  factors including hypertension x5 years and hyperlipidemia on a statin.  Family history of borderline diabetes as she is on metformin.   ALLERGIES:  No known drug allergies.   CURRENT MEDICATIONS:  1. Prozac 20 mg a day.  2. Prilosec over-the-counter two daily.  3. Cozaar 50 mg a day.  4. Pravachol 20 mg a day.  5. Metformin 850 mg p.o. b.i.d.  6. Benadryl p.r.n.  7. History of gastroesophageal reflux.   PAST SURGICAL HISTORY:  1. Bilateral tubal ligation.  2. Appendectomy.  3. T&A.  4. Colon screening.   SOCIAL HISTORY:  She lives in [place] alone.  She does not drink or  smoke.  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   FAMILY HISTORY:  Her mother died of a heart attack at age 49.  Her  brother died of coronary disease.   REVIEW OF SYSTEMS:  In addition to the HPI.  She has some nasal  congestion.  She has also complained of some numbness in both hands and  finger tips when she was  having chest discomfort.  She has some  depression and anxiety.  She has had some arthralgias.  She has reflux  symptoms.  The rest of her review of systems are questioned and are  negative or noncontributory.   PHYSICAL EXAMINATION:  VITAL SIGNS:  Blood pressure 144/68, pulse is 96  and regular, temperature is 97.9, respirations 89 and unlabored,  saturations 95% on room air.  GENERAL:  Well-developed, obese, very pleasant, white female in no acute  distress.  HEENT:  Normocephalic, atraumatic.  PERLA.  Extraocular movements.  Sclerae are clear.  Facial symmetry is normal.  NECK:  Supple without obvious bruit.  Carotids are full with no JVD.  Thyroid is not enlarged.  LUNGS:  Clear.  HEART:  Regular rate and rhythm without click.  Scan is negative.  ABDOMEN:  Soft, good bowel sounds, nontender, nondistended.  GENITALIA/RECTAL:  Deferred.  EXTREMITIES:  Pulses are present  bilaterally and symmetrical.  He has  trace edema.  MUSCULOSKELETAL:  Grossly unremarkable.  NEUROLOGIC:  Exam is negative.   LABORATORY DATA AND X-RAY FINDINGS:  Chest x-ray shows no acute  cardiopulmonary disease.  EKG shows some T-wave changes in V2-V4.  CT  scan showed no pulmonary embolus.   Troponin was 0.18.  Hemoglobin 12.4.  Chemistry is pending.   ASSESSMENT:  1. Unstable angina/non-Q-wave myocardial infarction or non-ST-segment      elevation myocardial infarction.  2. Multiple cardiac risk factors.   RECOMMENDATIONS:  1. Enteric-coated aspirin.  2. IV heparin.  3. Check serial cardiac enzymes.  4. Check hemoglobin A1c and fasting lipid panel.  5. Catheterization on Monday, if stable over the weekend.  We can do      it sooner if she has unstable symptoms.  6. Will hold her metformin.  Will check a serum creatinine.   Thank you very much for the consultation.  Indications for risks and  potential benefits have been discussed with the patient and she agrees  to proceed.      Thomas C.  Verl Blalock, MD, [facility]  Electronically Signed     TCW/MEDQ  D:  09/11/2006  T:  09/13/2006  Job:  [id number]

## 2010-06-04 NOTE — H&P (Signed)
[name], [name]                ACCOUNT NO.:  [id number]   MEDICAL RECORD NO.:  [id number]          PATIENT TYPE:  INP   LOCATION:  4738                         FACILITY:  [facility]   PHYSICIAN:  Sandy Salaam. Shawna Orleans, DO      DATE OF BIRTH:  1940/12/20   DATE OF PROCEDURE:  DATE OF DISCHARGE:                    STAT - MUST CHANGE TO CORRECT WORK TYPE   CHIEF COMPLAINT:  Chest discomfort and shortness of breath.   HISTORY OF PRESENT ILLNESS:  The patient is a 70 year old white female  here with above complaints. She was seen in [facility] ER 2 days prior  to her presentation with chief complaints of headache. She awoke with a  severe headache on Wednesday evening. They performed a CT in the ER  which was negative. She also noted orthopnea and progressive dyspnea on  exertion. A D-dimer was ordered which was elevated which lead to a CT of  her chest. This was negative for pulmonary embolism but the radiologist  noted bilateral pulmonary ground-glass opacity that is nonspecific with  possibilities of pneumonitis, edema or bronchiolitis obliterans.   Since ER evaluation the patient is still having difficulty with  breathing and describes orthopnea. She also feels lightheaded and  fatigued. She does have some heaviness of her chest which is exertional.   PAST MEDICAL HISTORY:  1. History of right DVT in 1987.  2. History of asthma.  3. Abnormal glucose/prediabetes.  4. History of ACE inhibitor cough.  5. Hypertension.  6. Gout.  7. Hyperlipidemia.  8. Osteoarthritis.  9. Depression.   PAST SURGICAL HISTORY:  1. Status post appendectomy in 1951.  2. Status post tubal ligation in 1968.  3. Tonsillectomy in 1950.  4. Her last colonoscopy was in 2007.   CURRENT MEDICATIONS:  1. Prozac 20 mg once a day.  2. Omeprazole 20 mg orally b.i.d.  3. Pravastatin 20 mg at bedtime.  4. Metformin 850 mg b.i.d. with meals.  5. Cozaar 50 mg in a.m.   ALLERGIES:  TO MEDICATIONS INCLUDE  1. SULFA.  2. ACE INHIBITORS.  3. INDOCIN WHICH CAUSES DIZZINESS.   SOCIAL HISTORY:  The patient is widowed. Has 47 year old daughter. The  patient does not drink and no history of tobacco use in the past.   FAMILY HISTORY:  Mother deceased at age 9 secondary to coronary artery  disease. Father deceased at age 18 due to suicide. There are also  several siblings noted to have history of coronary artery disease.   REVIEW OF SYSTEMS:  As noted above. The patient has not noticed any  lower-extremity swelling. Her hydrochlorothiazide was discontinued  within the last 3 months due to exacerbations of gout.   PHYSICAL EXAMINATION:  VITAL SIGNS: Height is 5 feet 5 inches, weight is  208 pounds, temperature is 97.6, pulse is 89. BP is 126/76.  GENERAL: In general, this patient is a pleasant, obese 70 year old white  female in no apparent distress.  HEENT: Normocephalic, atraumatic. Pupils are equal and reactive to light  bilaterally. Extra-ocular motility was intact. The patient was  anicteric. Conjunctivae were within normal limits. Oropharyngeal exam  was unremarkable.  Mucous membranes were moist.  NECK: The neck was supple, no adenopathy or carotid bruit or  thyromegaly. Could not appreciate any JVD.  CHEST: Exam shows a normal expiratory effort. The chest was clear to  auscultation bilaterally. No rhonchi, no rales, no wheezing.  CARDIOVASCULAR: Regular rate and rhythm. No significant murmurs, rubs or  gallops appreciated.  ABDOMEN: The abdomen was protuberant, nontender, positive bowel sounds,  no organomegaly.  MUSCULOSKELETAL: No clubbing, cyanosis or edema.  SKIN: The skin was warm and dry.  NEUROLOGIC: Cranial nerves II through XII were grossly intact. She was  nonfocal.   IMPRESSION:  1. Chest pain.  2. Progressive dyspnea.  3. Hypertension.  4. Gastroesophageal reflux disease .  5. Abnormal glucose.  6. Strong family history of coronary artery disease.   RECOMMENDATIONS:  1. The  patient will be admitted for observation. We will obtain      cardiology consult.  2. We will obtain a 2D echo and also a BNP.  We will cycle cardiac      enzymes.  3. We will hold metformin in the event the patient may need cardiac      catheterization.  4. Start aspirin 325 mg once a day.      Sandy Salaam. Shawna Orleans, DO     RDY/MEDQ  D:  09/11/2006  T:  09/11/2006  Job:  [id number]

## 2010-06-04 NOTE — Assessment & Plan Note (Signed)
[place] OFFICE NOTE   NAME:[name], [name]                       MRN:          [id number]  DATE:10/01/2006                            DOB:          1940/11/01    [name] returns today after being admitted to the hospital with chest  pain and a slight increase in her troponin to 0.18.  We assume she had a  non-ST segment elevation MI.  She underwent cardiac catheterization  which showed normal coronary arteries and normal left ventricular  function.  She did have some ventricular ectopy.  Her CT scan showed no  evidence of pulmonary embolus but she did have bilateral pulmonary  ground glass opacities, which were nonspecific.  She has been treated  with an antibiotic and she has remarkably improved.  A 2D echo showed a  mild pericardial effusion in the hospital.  Dr. Percival Spanish were concerned  about a nonspecific or viral myocarditis and pericarditis.  She is  feeling remarkably well, better.  She did develop a right femoral  pseudoaneurysm which was compressed in the office.  It is not giving her  any trouble now.   She has several cardiac risk factors and is on Cozaar for hypertension  and Pravastatin for hyperlipidemia.   She has multiple drug allergies.   PHYSICAL EXAMINATION:  VITAL SIGNS:  Her blood pressure is 138/72.  Her  pulse is 84 and regular.  Weight is 205.  GENERAL:  She is in no acute distress.  SKIN:  Warm and dry.  HEENT:  Unremarkable.  NECK:  Carotids are full.  There is no lymphadenopathy.  Thyroid is not  enlarged.  Trachea is midline.  LUNGS:  Clear with no rub or decreased breath sounds.  HEART:  Reveals a regular rate and rhythm without rub.  ABDOMEN:  Soft.  Good bowel sounds.  Organomegaly can not be assessed.  EXTREMITIES:  Reveal 1 plus pitting edema.  Pulses are intact.  NEUROLOGIC:  Intact.   ASSESSMENT:  Resolving probable viral myopericarditis.   PLAN:  Repeat a 2D  echocardiogram in about 4 weeks to see if effusion  resolved.  If so, we will see her back on a p.r.n. basis.     Thomas C. Verl Blalock, MD, [facility]  Electronically Signed   TCW/MedQ  DD: 10/01/2006  DT: 10/01/2006  Job #: [id number]   cc:   Sandy Salaam. Shawna Orleans, DO

## 2010-06-04 NOTE — H&P (Signed)
[place]   NAME:[name], [name]                       MRN:          [id number]  DATE:09/18/2006                            DOB:          1940/08/15    ADMITTING DIAGNOSIS:  Right femoral artery pseudoaneurysm.   CHIEF COMPLAINT:  My leg is swollen and turned blue.   HISTORY OF PRESENT ILLNESS:  The patient is a very pleasant 70 year old  woman who was admitted to the hospital on August 22 with chest tightness  and shortness of breath and was found to have elevated cardiac enzymes.  She underwent catheterization which demonstrated no obstructive coronary  artery disease and preserved LV function.  Postprocedurally, the patient  has a small bruise which increased in size over the last several days  since discharge.  She was seen by Dr. Carlena Bjornstad earlier today and referred  over here for additional evaluation.  She has undergone ultrasound in  our office today which demonstrates a right groin pseudoaneurysm arising  from the anterior aspect of the common femoral artery and measuring  greater than 2.5 cm with a 2 cm long neck between the PSA and common  femoral artery.  The patient has tenderness in her groin and tenderness  in her back.  She has no syncope.  She denies any coldness or coolness  or pain in her right leg.  She denies chest pain or shortness of breath.   PAST MEDICAL HISTORY:  1. A DVT in 1987.  2. She has a history of asthma.  3. She has a history of prediabetes.  4. She has a history of hypertension and dyslipidemia.   FAMILY HISTORY:  Noncontributory.   SOCIAL HISTORY:  The patient denies tobacco or ethanol abuse.   REVIEW OF SYSTEMS:  As noted in the HPI, and she also notes some  soreness in her right groin.   PHYSICAL EXAM:  She is a pleasant, obese, 70 year old woman in no acute  distress.  The blood pressure today was 124/71.  The pulse was 81 and regular.  The  respirations were  18.  Weight was 207 pounds.  NECK:  No jugular venous distention.  LUNGS:  Clear bilaterally to auscultation.  No wheezes, rales, or  rhonchi are present.  There is no increased work of breathing.  CARDIOVASCULAR:  Regular rate and rhythm with normal S1 and S2.  There  are no murmurs, rubs, or gallops present.  The PMI was not enlarged or  bilaterally displaced.  ABDOMEN:  Soft, nontender, nondistended.  There was no organomegaly.  FLANK EXAM:  Some mild tenderness in the right flank.  The groin  demonstrates very large ecchymotic area covering the posterior buttocks,  the groin approximately 5 cm down the thigh anteriorly.  There is  tenderness there.  NEUROLOGIC:  Alert and oriented x3.  Cranial nerves intact.  The  strength is 5/5 symmetric.   IMPRESSION:  1. Right groin pseudoaneurysm.  2. Recent catheterization secondary to chest pain and shortness of      breath.  3. Hypertension.  4.  Dyslipidemia.  5. Borderline diabetes.   DISCUSSION:  The patient will be admitted for pseudoaneurysm thrombosis.  I have discussed this with Dr. Gerrit Halls, who will help [place] with this.     Champ Mungo. Lovena Le, MD  Electronically Signed    GWT/MedQ  DD: 09/18/2006  DT: 09/19/2006  Job #: [id number]   cc:   Sandy Salaam. Shawna Orleans, DO

## 2010-06-04 NOTE — Discharge Summary (Signed)
NAMEQUANA, [name]                ACCOUNT NO.:  [id number]   MEDICAL RECORD NO.:  [id number]          PATIENT TYPE:  INP   LOCATION:  4738                         FACILITY:  [facility]   PHYSICIAN:  Valerie A. Asa Lente, MDDATE OF BIRTH:  11/16/1940   DATE OF ADMISSION:  09/11/2006  DATE OF DISCHARGE:  09/15/2006                               DISCHARGE SUMMARY   DISCHARGE DIAGNOSES:  1. Non-ST elevation myocardial infarction.  2. Diabetes, type 2.  3. Hypertension  4. Gastroesophageal reflux disease.  5. Dyslipidemia.   HISTORY OF PRESENT ILLNESS:  [name] is a 70 year old female who was  admitted and September 11, 2006, with chief complaint of chest tightness  and shortness of breath. She noted the symptoms over the past several  weeks prior to this admission, and 3 days as prior to this admission she  developed chest tightness at rest.  She was admitted for further  evaluation and treatment.   PAST MEDICAL HISTORY:  1. Hypertension.  2. Depression.  3. History of right DVT 1987.  4. Asthma.  5. Abnormal glucose, pre-diabetes.  6. History of ACE inhibitor cough.  7. Hypertension.  8. Osteoarthritis.  9. Hyperlipidemia.  10.Gout.  11.Hypertension.   NEXT SECTION:   HOSPITAL COURSE:  NON-ST-ELEVATION MYOCARDIAL INFARCTION:  The patient was admitted and  was noted to have an elevation of her troponin as high as 0.18.  She was  seen by cardiology and underwent a cardiac catheterization which was  performed on September 14, 2006. Cardiac catheterization was within normal  limits.  It was recommended that the patient be continued on aspirin.  The question of whether or not the patient may have mild myocarditis  spasm was doubted per cardiology. The patient is currently asymptomatic.   DISCHARGE MEDICATIONS:  1. Aspirin 325 mg p.o. daily.  2. Cozaar 50 mg p.o. daily.  3. Pravastatin 20 mg p.o. daily.  4. Fluoxetine 20 mg p.o. daily.  5. Omeprazole 20 mg p.o. daily.   PERTINENT LABORATORY DATA:  At time of discharge, BUN 19, creatinine  1.06. Hemoglobin 10.8, hematocrit 32.   DISPOSITION:  Plan to discharge the patient home.   FOLLOWUP:  The patient is instructed to follow up with Dr. Drema Pry in  2-3 weeks.  She is also instructed to follow up with Dr. Verl Blalock on  September 11 at 10:30 a.m.  She is instructed to return to the emergency  room should she develop recurrent chest pain or shortness of breath.      [name], NP      Jannifer Rodney. Asa Lente, MD  Electronically Signed    MO/MEDQ  D:  09/15/2006  T:  09/15/2006  Job:  [id number]   cc:   Thomas C. Verl Blalock, MD, [facility]  Sandy Salaam. Shawna Orleans, DO

## 2010-07-15 ENCOUNTER — Other Ambulatory Visit: Payer: Self-pay | Admitting: Internal Medicine

## 2010-07-15 NOTE — Telephone Encounter (Signed)
Rx refill sent to pharmacy. Patient due for office visit 

## 2010-08-20 ENCOUNTER — Other Ambulatory Visit: Payer: Self-pay | Admitting: Internal Medicine

## 2010-08-21 NOTE — Telephone Encounter (Signed)
Rx refill x 1 sent to pharmacy. Patient will need office visit.

## 2010-09-20 ENCOUNTER — Encounter: Payer: Self-pay | Admitting: Internal Medicine

## 2010-09-20 ENCOUNTER — Ambulatory Visit (INDEPENDENT_AMBULATORY_CARE_PROVIDER_SITE_OTHER): Payer: MEDICARE | Admitting: Internal Medicine

## 2010-09-20 VITALS — BP 100/74 | HR 102 | Temp 97.8°F | Resp 18

## 2010-09-20 DIAGNOSIS — I10 Essential (primary) hypertension: Secondary | ICD-10-CM

## 2010-09-20 DIAGNOSIS — Z79899 Other long term (current) drug therapy: Secondary | ICD-10-CM

## 2010-09-20 DIAGNOSIS — Z1239 Encounter for other screening for malignant neoplasm of breast: Secondary | ICD-10-CM

## 2010-09-20 DIAGNOSIS — J45909 Unspecified asthma, uncomplicated: Secondary | ICD-10-CM

## 2010-09-20 DIAGNOSIS — E785 Hyperlipidemia, unspecified: Secondary | ICD-10-CM

## 2010-09-20 LAB — HEPATIC FUNCTION PANEL
AST: 19 U/L (ref 0–37)
Alkaline Phosphatase: 53 U/L (ref 39–117)
Indirect Bilirubin: 0.5 mg/dL (ref 0.0–0.9)
Total Bilirubin: 0.6 mg/dL (ref 0.3–1.2)
Total Protein: 7.1 g/dL (ref 6.0–8.3)

## 2010-09-20 LAB — LIPID PANEL
LDL Cholesterol: 90 mg/dL (ref 0–99)
Triglycerides: 143 mg/dL (ref <150)

## 2010-09-20 LAB — CBC
HCT: 41.8 % (ref 36.0–46.0)
MCHC: 31.8 g/dL (ref 30.0–36.0)
Platelets: 193 10*3/uL (ref 150–400)
RDW: 14 % (ref 11.5–15.5)
WBC: 6.6 10*3/uL (ref 4.0–10.5)

## 2010-09-20 LAB — BASIC METABOLIC PANEL
BUN: 22 mg/dL (ref 6–23)
Calcium: 9.4 mg/dL (ref 8.4–10.5)
Chloride: 107 mEq/L (ref 96–112)
Creat: 1.11 mg/dL — ABNORMAL HIGH (ref 0.50–1.10)

## 2010-09-20 MED ORDER — TIOTROPIUM BROMIDE MONOHYDRATE 18 MCG IN CAPS: 18.0000 ug | ORAL_CAPSULE | Freq: Every day | RESPIRATORY_TRACT | Status: DC

## 2010-09-20 MED ORDER — PANTOPRAZOLE SODIUM 40 MG PO TBEC: 40.0000 mg | DELAYED_RELEASE_TABLET | Freq: Every day | ORAL | Status: DC

## 2010-09-20 MED ORDER — POTASSIUM CHLORIDE CRYS ER 20 MEQ PO TBCR: 20.0000 meq | EXTENDED_RELEASE_TABLET | Freq: Every day | ORAL | Status: DC

## 2010-09-20 MED ORDER — BUDESONIDE-FORMOTEROL FUMARATE 160-4.5 MCG/ACT IN AERO: 2.0000 | INHALATION_SPRAY | Freq: Two times a day (BID) | RESPIRATORY_TRACT | Status: DC

## 2010-09-20 NOTE — Patient Instructions (Signed)
Please schedule lipid/lft 272.4 and chem7 v58.69 prior to next visit

## 2010-09-29 NOTE — Assessment & Plan Note (Signed)
Normotensive and stable. Continue current regimen. Monitor bp as outpt and followup in clinic as scheduled. Obtain cbc and chem7

## 2010-09-29 NOTE — Progress Notes (Signed)
  Subjective:    Patient ID: [name], female    DOB: 03-30-1940, 70 y.o.   MRN: [id number]  HPI Pt presents to clinic for followup of multiple medical problems. Notes intermittent dyspnea and wheezing without trigger. Needs inhalers refilled. Due for annual mammogram. H/o osteopenia without recent fx. bmd 3/11. Requests zostavax prescription. No other complaints.  Past Medical History  Diagnosis Date  . Asthma     PFT 02/06/09 FEV1 1.41 (65%), FVC 1.92 (64), FEV1% 74, TLC 3.47 (71%), DLCO 48%, +BD  . ACE-inhibitor cough   . DVT (deep venous thrombosis) 1987  . GERD (gastroesophageal reflux disease)   . Arthritis   . Atypical chest pain     s/p cath, Normal coronaries, Non ST elevation myocardial infarction, Rt groin pseudoaneurysm  . Hyperlipidemia   . Hypertension   . Depression   . Gout   . Abnormal glucose tolerance test    Past Surgical History  Procedure Date  . Appendectomy 1951  . Tubal ligation 1968  . Tonsillectomy 1950    reports that she has never smoked. She has never used smokeless tobacco. She reports that she does not drink alcohol or use illicit drugs. family history includes Asthma in her sister and Coronary artery disease in her brothers, other, and sisters. Allergies  Allergen Reactions  . Ace Inhibitors   . Indomethacin   . Montelukast Sodium     REACTION: Heart palpitations, chest pain  . Oseltamivir Phosphate     REACTION: nausea, vomiting, diarrhea, dizziness  . Sulfonamide Derivatives      Review of Systems see hpi     Objective:   Physical Exam  Physical Exam  Nursing note and vitals reviewed. Constitutional: Appears well-developed and well-nourished. No distress.  HENT:  Head: Normocephalic and atraumatic.  Right Ear: External ear normal.  Left Ear: External ear normal.  Eyes: Conjunctivae are normal. No scleral icterus.  Neck: Neck supple. Carotid bruit is not present.  Cardiovascular: Normal rate, regular rhythm and normal heart  sounds.  Exam reveals no gallop and no friction rub.   No murmur heard. Pulmonary/Chest: Effort normal and breath sounds normal. No respiratory distress. He has no wheezes. no rales.  Lymphadenopathy:    He has no cervical adenopathy.  Neurological:Alert.  Skin: Skin is warm and dry. Not diaphoretic.  Psychiatric: Has a normal mood and affect.         Assessment & Plan:

## 2010-09-29 NOTE — Assessment & Plan Note (Signed)
rf inhalers. Followup if no improvement or worsening.

## 2010-09-29 NOTE — Assessment & Plan Note (Addendum)
Obtain lipid/lft. Continue statin tx.

## 2010-11-01 LAB — BASIC METABOLIC PANEL
BUN: 18
CO2: 29
Calcium: 9.1
Calcium: 9.5
Calcium: 9.7
Chloride: 108
GFR calc Af Amer: >60
GFR calc Af Amer: >60
GFR calc non Af Amer: 47 — ABNORMAL LOW
GFR calc non Af Amer: 52 — ABNORMAL LOW
GFR calc non Af Amer: 52 — ABNORMAL LOW
Glucose, Bld: 79
Potassium: 3.8
Potassium: 3.9
Potassium: 4.2
Sodium: 143
Sodium: 143

## 2010-11-01 LAB — PROTIME-INR
INR: 1
Prothrombin Time: 13.3

## 2010-11-01 LAB — CARDIAC PANEL(CRET KIN+CKTOT+MB+TROPI)
CK, MB: 1.8
Relative Index: INVALID
Relative Index: INVALID
Relative Index: INVALID
Troponin I: 0.08 — ABNORMAL HIGH
Troponin I: 0.18 — ABNORMAL HIGH

## 2010-11-01 LAB — COMPREHENSIVE METABOLIC PANEL
AST: 18
AST: 22
Albumin: 3.3 — ABNORMAL LOW
BUN: 20
BUN: 18
CO2: 25
Calcium: 8.9
Chloride: 105
Chloride: 108
Creatinine, Ser: 1.01
Creatinine, Ser: 1.03
GFR calc Af Amer: >60
GFR calc Af Amer: >60
GFR calc non Af Amer: 54 — ABNORMAL LOW
Glucose, Bld: 116 — ABNORMAL HIGH
Total Bilirubin: 0.7
Total Bilirubin: 1.3 — ABNORMAL HIGH
Total Protein: 6.3

## 2010-11-01 LAB — LIPID PANEL
Cholesterol: 131
LDL Cholesterol: 71
Triglycerides: 89
VLDL: 18

## 2010-11-01 LAB — CBC
HCT: 29.6 — ABNORMAL LOW
HCT: 31.7 — ABNORMAL LOW
HCT: 36.4
Hemoglobin: 10 — ABNORMAL LOW
Hemoglobin: 10.6 — ABNORMAL LOW
Hemoglobin: 10.8 — ABNORMAL LOW
Hemoglobin: 12.4
Hemoglobin: 12.4
MCHC: 33.4
MCHC: 33.6
MCHC: 33.6
MCV: 90.4
MCV: 89.6
Platelets: 186
Platelets: 206
RBC: 3.26 — ABNORMAL LOW
RBC: 4.06
RBC: 4.07
RDW: 13
RDW: 13.3
WBC: 7.3
WBC: 13.9 — ABNORMAL HIGH

## 2010-11-01 LAB — DIFFERENTIAL
Basophils Absolute: 0.1
Eosinophils Relative: 2
Lymphocytes Relative: 20
Lymphocytes Relative: 4 — ABNORMAL LOW
Lymphs Abs: 1.5
Monocytes Absolute: 0.7
Monocytes Relative: 10
Neutro Abs: 4.7
Neutrophils Relative %: 64
Neutrophils Relative %: 90 — ABNORMAL HIGH

## 2010-11-01 LAB — CK TOTAL AND CKMB (NOT AT ARMC): Relative Index: INVALID

## 2010-11-01 LAB — URINALYSIS, ROUTINE W REFLEX MICROSCOPIC
Bilirubin Urine: NEGATIVE
Glucose, UA: NEGATIVE
Hgb urine dipstick: NEGATIVE
Hgb urine dipstick: NEGATIVE
Specific Gravity, Urine: 1.031 — ABNORMAL HIGH
Specific Gravity, Urine: 1.022
Urobilinogen, UA: 1
pH: 6.5
pH: 6

## 2010-11-01 LAB — D-DIMER, QUANTITATIVE: D-Dimer, Quant: 1.04 — ABNORMAL HIGH

## 2010-11-01 LAB — HEPARIN LEVEL (UNFRACTIONATED)
Heparin Unfractionated: 0.47
Heparin Unfractionated: 0.6

## 2010-11-01 LAB — HEMOGLOBIN A1C: Hgb A1c MFr Bld: 5.7

## 2010-12-09 ENCOUNTER — Encounter: Payer: Self-pay | Admitting: Internal Medicine

## 2010-12-09 ENCOUNTER — Ambulatory Visit (INDEPENDENT_AMBULATORY_CARE_PROVIDER_SITE_OTHER): Payer: MEDICARE | Admitting: Internal Medicine

## 2010-12-09 VITALS — BP 120/80 | HR 97 | Temp 97.5°F | Resp 22 | Wt 232.0 lb

## 2010-12-09 DIAGNOSIS — N390 Urinary tract infection, site not specified: Secondary | ICD-10-CM

## 2010-12-09 DIAGNOSIS — R3915 Urgency of urination: Secondary | ICD-10-CM

## 2010-12-09 DIAGNOSIS — J310 Chronic rhinitis: Secondary | ICD-10-CM

## 2010-12-09 LAB — POCT URINALYSIS DIPSTICK
Bilirubin, UA: NEGATIVE
Blood, UA: NEGATIVE
Glucose, UA: NEGATIVE
Spec Grav, UA: <=1.005
Urobilinogen, UA: 0.2

## 2010-12-09 MED ORDER — CIPROFLOXACIN HCL 500 MG PO TABS: 500.0000 mg | ORAL_TABLET | Freq: Two times a day (BID) | ORAL | Status: AC

## 2010-12-11 DIAGNOSIS — N390 Urinary tract infection, site not specified: Secondary | ICD-10-CM | POA: Insufficient documentation

## 2010-12-11 DIAGNOSIS — J310 Chronic rhinitis: Secondary | ICD-10-CM | POA: Insufficient documentation

## 2010-12-11 NOTE — Assessment & Plan Note (Signed)
UA obtained and reviewed. Begin cipro x 7d. Followup if no improvement or worsening.

## 2010-12-11 NOTE — Assessment & Plan Note (Signed)
Recommend taking flonase qd

## 2010-12-11 NOTE — Progress Notes (Signed)
  Subjective:    Patient ID: [name], female    DOB: 1940-12-15, 70 y.o.   MRN: [id number]  HPI Pt presents to clinic for followup of multiple medical problems. C/o nasal congestion and drainage c/w rhinitis. Using flonase prn. Notes 7d h/o suprapubic pain and pressure. Has associated lbp, urinary frequency without f/c, n/v. No alleviating or exacerbating factors. Taking no medication for the problem. Requests zostavax prescription which is given. No other complaints.  Past Medical History  Diagnosis Date  . Asthma     PFT 02/06/09 FEV1 1.41 (65%), FVC 1.92 (64), FEV1% 74, TLC 3.47 (71%), DLCO 48%, +BD  . ACE-inhibitor cough   . DVT (deep venous thrombosis) 1987  . GERD (gastroesophageal reflux disease)   . Arthritis   . Atypical chest pain     s/p cath, Normal coronaries, Non ST elevation myocardial infarction, Rt groin pseudoaneurysm  . Hyperlipidemia   . Hypertension   . Depression   . Gout   . Abnormal glucose tolerance test    Past Surgical History  Procedure Date  . Appendectomy 1951  . Tubal ligation 1968  . Tonsillectomy 1950    reports that she has never smoked. She has never used smokeless tobacco. She reports that she does not drink alcohol or use illicit drugs. family history includes Asthma in her sister and Coronary artery disease in her brothers, other, and sisters. Allergies  Allergen Reactions  . Ace Inhibitors   . Indomethacin   . Montelukast Sodium     REACTION: Heart palpitations, chest pain  . Oseltamivir Phosphate     REACTION: nausea, vomiting, diarrhea, dizziness  . Sulfonamide Derivatives      Review of Systems see hpi     Objective:   Physical Exam  Nursing note and vitals reviewed. Constitutional: She appears well-developed and well-nourished. No distress.  HENT:  Head: Normocephalic and atraumatic.  Eyes: Conjunctivae are normal. No scleral icterus.  Abdominal: Soft. She exhibits no distension. There is no tenderness.    Musculoskeletal:       No cva tenderness  Neurological: She is alert.  Skin: Skin is warm and dry. She is not diaphoretic.  Psychiatric: She has a normal mood and affect.          Assessment & Plan:

## 2011-01-18 ENCOUNTER — Other Ambulatory Visit: Payer: Self-pay | Admitting: Internal Medicine

## 2011-01-27 ENCOUNTER — Encounter: Payer: Self-pay | Admitting: Internal Medicine

## 2011-01-27 ENCOUNTER — Ambulatory Visit (INDEPENDENT_AMBULATORY_CARE_PROVIDER_SITE_OTHER): Payer: MEDICARE | Admitting: Internal Medicine

## 2011-01-27 VITALS — BP 126/80 | HR 108 | Temp 97.8°F | Resp 22 | Wt 232.0 lb

## 2011-01-27 DIAGNOSIS — R109 Unspecified abdominal pain: Secondary | ICD-10-CM

## 2011-01-27 LAB — BASIC METABOLIC PANEL
CO2: 27 mEq/L (ref 19–32)
Calcium: 9.7 mg/dL (ref 8.4–10.5)
Sodium: 141 mEq/L (ref 135–145)

## 2011-01-27 LAB — HEPATIC FUNCTION PANEL
Alkaline Phosphatase: 50 U/L (ref 39–117)
Bilirubin, Direct: 0.1 mg/dL (ref 0.0–0.3)
Indirect Bilirubin: 0.5 mg/dL (ref 0.0–0.9)
Total Bilirubin: 0.6 mg/dL (ref 0.3–1.2)

## 2011-01-27 MED ORDER — PANTOPRAZOLE SODIUM 40 MG PO TBEC: 40.0000 mg | DELAYED_RELEASE_TABLET | Freq: Every day | ORAL | Status: DC

## 2011-01-27 MED ORDER — ALBUTEROL SULFATE HFA 108 (90 BASE) MCG/ACT IN AERS: 2.0000 | INHALATION_SPRAY | RESPIRATORY_TRACT | Status: DC | PRN

## 2011-01-27 MED ORDER — SERTRALINE HCL 50 MG PO TABS: 50.0000 mg | ORAL_TABLET | Freq: Every day | ORAL | Status: DC

## 2011-01-27 MED ORDER — PRAVASTATIN SODIUM 20 MG PO TABS: 20.0000 mg | ORAL_TABLET | Freq: Every day | ORAL | Status: DC

## 2011-01-27 MED ORDER — ALLOPURINOL 100 MG PO TABS: 200.0000 mg | ORAL_TABLET | Freq: Every day | ORAL | Status: DC

## 2011-01-27 MED ORDER — BUDESONIDE-FORMOTEROL FUMARATE 160-4.5 MCG/ACT IN AERO: 2.0000 | INHALATION_SPRAY | Freq: Two times a day (BID) | RESPIRATORY_TRACT | Status: DC

## 2011-01-27 MED ORDER — TIOTROPIUM BROMIDE MONOHYDRATE 18 MCG IN CAPS: 18.0000 ug | ORAL_CAPSULE | Freq: Every day | RESPIRATORY_TRACT | Status: DC

## 2011-01-27 MED ORDER — POTASSIUM CHLORIDE CRYS ER 20 MEQ PO TBCR: 20.0000 meq | EXTENDED_RELEASE_TABLET | Freq: Every day | ORAL | Status: DC

## 2011-01-27 MED ORDER — ALBUTEROL SULFATE 0.63 MG/3ML IN NEBU: 1.0000 | INHALATION_SOLUTION | Freq: Four times a day (QID) | RESPIRATORY_TRACT | Status: DC | PRN

## 2011-01-27 MED ORDER — LOSARTAN POTASSIUM 50 MG PO TABS: 50.0000 mg | ORAL_TABLET | Freq: Every day | ORAL | Status: DC

## 2011-01-27 MED ORDER — ALENDRONATE SODIUM 70 MG PO TABS: 70.0000 mg | ORAL_TABLET | ORAL | Status: DC

## 2011-01-27 MED ORDER — FUROSEMIDE 40 MG PO TABS: 20.0000 mg | ORAL_TABLET | ORAL | Status: DC

## 2011-01-27 NOTE — Patient Instructions (Signed)
We are in the process of scheduling your CT scan

## 2011-01-27 NOTE — Progress Notes (Signed)
  Subjective:    Patient ID: [name], female    DOB: [birth year], 71 y.o.   MRN: [id number]  HPI Pt presents to clinic for evaluation of abdominal pain. Pt notes bilateral lower abdominal pain since November. Sx's began after a fall striking her right side. Pain does not radiate and is not associated with n/v, f/c, changes in bowel habits, blood in stool. May worsen with urination or lying down. Pain has not been positional or reproducible. Saw gyn who performed reportedly unremarkable UA and transvaginal US. No other alleviating or exacerbating factors. No other complaints.  Past Medical History  Diagnosis Date  . Asthma     PFT 02/06/09 FEV1 1.41 (65%), FVC 1.92 (64), FEV1% 74, TLC 3.47 (71%), DLCO 48%, +BD  . ACE-inhibitor cough   . DVT (deep venous thrombosis) 1987  . GERD (gastroesophageal reflux disease)   . Arthritis   . Atypical chest pain     s/p cath, Normal coronaries, Non ST elevation myocardial infarction, Rt groin pseudoaneurysm  . Hyperlipidemia   . Hypertension   . Depression   . Gout   . Abnormal glucose tolerance test    Past Surgical History  Procedure Date  . Appendectomy 1951  . Tubal ligation 1968  . Tonsillectomy 1950    reports that she has never smoked. She has never used smokeless tobacco. She reports that she does not drink alcohol or use illicit drugs. family history includes Asthma in her sister and Coronary artery disease in her brothers, other, and sisters. Allergies  Allergen Reactions  . Ace Inhibitors   . Indomethacin   . Montelukast Sodium     REACTION: Heart palpitations, chest pain  . Oseltamivir Phosphate     REACTION: nausea, vomiting, diarrhea, dizziness  . Sulfonamide Derivatives      Review of Systems see hpi     Objective:   Physical Exam  Nursing note and vitals reviewed. Constitutional: She appears well-developed and well-nourished. No distress.  HENT:  Head: Normocephalic and atraumatic.  Right Ear: External ear  normal.  Left Ear: External ear normal.  Eyes: Conjunctivae are normal. No scleral icterus.  Abdominal: Soft. Normal appearance and bowel sounds are normal. She exhibits no distension and no mass. There is no hepatosplenomegaly. There is no tenderness. There is no rebound, no guarding and no CVA tenderness.  Neurological: She is alert.  Skin: Skin is warm and dry. She is not diaphoretic.  Psychiatric: She has a normal mood and affect.          Assessment & Plan:

## 2011-01-28 LAB — CBC WITH DIFFERENTIAL/PLATELET
Basophils Absolute: 0 10*3/uL (ref 0.0–0.1)
Basophils Relative: 0 % (ref 0–1)
Eosinophils Absolute: 0.2 10*3/uL (ref 0.0–0.7)
Lymphs Abs: 1.9 10*3/uL (ref 0.7–4.0)
MCH: 30 pg (ref 26.0–34.0)
Neutrophils Relative %: 61 % (ref 43–77)
Platelets: 192 10*3/uL (ref 150–400)
RBC: 4.47 MIL/uL (ref 3.87–5.11)
RDW: 13.9 % (ref 11.5–15.5)

## 2011-01-30 DIAGNOSIS — R109 Unspecified abdominal pain: Secondary | ICD-10-CM | POA: Insufficient documentation

## 2011-01-30 NOTE — Assessment & Plan Note (Signed)
Obtain cbc, chem7 and lft. Schedule abd/pelvic ct for further evaluation.

## 2011-01-31 ENCOUNTER — Ambulatory Visit (HOSPITAL_BASED_OUTPATIENT_CLINIC_OR_DEPARTMENT_OTHER)
Admission: RE | Admit: 2011-01-31 | Discharge: 2011-01-31 | Disposition: A | Discharge status: Home / Self Care | Payer: MEDICARE | Source: Ambulatory Visit | Attending: Internal Medicine | Admitting: Internal Medicine

## 2011-01-31 DIAGNOSIS — R1032 Left lower quadrant pain: Secondary | ICD-10-CM | POA: Insufficient documentation

## 2011-01-31 DIAGNOSIS — R1031 Right lower quadrant pain: Secondary | ICD-10-CM | POA: Insufficient documentation

## 2011-01-31 DIAGNOSIS — R109 Unspecified abdominal pain: Secondary | ICD-10-CM

## 2011-01-31 MED ORDER — IOHEXOL 300 MG/ML  SOLN
100.0000 mL | Freq: Once | INTRAMUSCULAR | Status: AC | PRN
  Administered 2011-01-31: 100 mL via INTRAVENOUS

## 2011-02-03 ENCOUNTER — Ambulatory Visit (INDEPENDENT_AMBULATORY_CARE_PROVIDER_SITE_OTHER): Payer: MEDICARE | Admitting: Internal Medicine

## 2011-02-03 ENCOUNTER — Encounter: Payer: Self-pay | Admitting: Internal Medicine

## 2011-02-03 ENCOUNTER — Ambulatory Visit: Payer: MEDICARE | Admitting: Internal Medicine

## 2011-02-03 DIAGNOSIS — I10 Essential (primary) hypertension: Secondary | ICD-10-CM

## 2011-02-03 DIAGNOSIS — E785 Hyperlipidemia, unspecified: Secondary | ICD-10-CM

## 2011-02-03 DIAGNOSIS — R109 Unspecified abdominal pain: Secondary | ICD-10-CM

## 2011-02-07 ENCOUNTER — Ambulatory Visit: Payer: MEDICARE | Admitting: Internal Medicine

## 2011-02-08 NOTE — Assessment & Plan Note (Signed)
Hold statin x one week to determine if arthralgias related

## 2011-02-08 NOTE — Progress Notes (Signed)
  Subjective:    Patient ID: [name], female    DOB: 1940-02-13, 71 y.o.   MRN: [id number]  HPI Pt presents to clinic for followup of multiple medical problems. Reviewed abd ct without acute finding/source of pain. Notes pain has spontaneously improved. Notes arthralgias including knee pain. No injury/trauma. No alleviating or exacerbating factors. Wonders if related to chol medication.  No other complaints  Past Medical History  Diagnosis Date  . Asthma     PFT 02/06/09 FEV1 1.41 (65%), FVC 1.92 (64), FEV1% 74, TLC 3.47 (71%), DLCO 48%, +BD  . ACE-inhibitor cough   . DVT (deep venous thrombosis) 1987  . GERD (gastroesophageal reflux disease)   . Arthritis   . Atypical chest pain     s/p cath, Normal coronaries, Non ST elevation myocardial infarction, Rt groin pseudoaneurysm  . Hyperlipidemia   . Hypertension   . Depression   . Gout   . Abnormal glucose tolerance test    Past Surgical History  Procedure Date  . Appendectomy 1951  . Tubal ligation 1968  . Tonsillectomy 1950    reports that she has never smoked. She has never used smokeless tobacco. She reports that she does not drink alcohol or use illicit drugs. family history includes Asthma in her sister and Coronary artery disease in her brothers, other, and sisters. Allergies  Allergen Reactions  . Ace Inhibitors   . Indomethacin   . Montelukast Sodium     REACTION: Heart palpitations, chest pain  . Oseltamivir Phosphate     REACTION: nausea, vomiting, diarrhea, dizziness  . Sulfonamide Derivatives       Review of Systems see hpi     Objective:   Physical Exam  Physical Exam  Nursing note and vitals reviewed. Constitutional: Appears well-developed and well-nourished. No distress.  HENT:  Head: Normocephalic and atraumatic.  Right Ear: External ear normal.  Left Ear: External ear normal.  Eyes: Conjunctivae are normal. No scleral icterus.  Neck: Neck supple. Carotid bruit is not present.    Cardiovascular: Normal rate, regular rhythm and normal heart sounds.  Exam reveals no gallop and no friction rub.   No murmur heard. Pulmonary/Chest: Effort normal and breath sounds normal. No respiratory distress. He has no wheezes. no rales.  Lymphadenopathy:    He has no cervical adenopathy.  Neurological:Alert.  Skin: Skin is warm and dry. Not diaphoretic.  Psychiatric: Has a normal mood and affect.        Assessment & Plan:

## 2011-02-08 NOTE — Assessment & Plan Note (Signed)
Normotensive and stable. Continue current regimen. Monitor bp as outpt and followup in clinic as scheduled.  

## 2011-02-08 NOTE — Assessment & Plan Note (Signed)
Improving. Ct neg. Follow up closely if no resolution or worsening.

## 2011-03-17 ENCOUNTER — Ambulatory Visit: Payer: MEDICARE | Admitting: Internal Medicine

## 2011-04-01 ENCOUNTER — Ambulatory Visit (INDEPENDENT_AMBULATORY_CARE_PROVIDER_SITE_OTHER): Payer: MEDICARE | Admitting: Internal Medicine

## 2011-04-01 ENCOUNTER — Encounter: Payer: Self-pay | Admitting: Internal Medicine

## 2011-04-01 VITALS — BP 130/80 | HR 86 | Temp 97.8°F | Resp 22

## 2011-04-01 DIAGNOSIS — J069 Acute upper respiratory infection, unspecified: Secondary | ICD-10-CM | POA: Insufficient documentation

## 2011-04-01 MED ORDER — AMOXICILLIN 875 MG PO TABS: 875.0000 mg | ORAL_TABLET | Freq: Two times a day (BID) | ORAL | Status: AC

## 2011-04-01 NOTE — Assessment & Plan Note (Signed)
Begin amoxicillin x7d. Followup if no improvement or worsening.

## 2011-04-01 NOTE — Progress Notes (Signed)
  Subjective:    Patient ID: [name], female    DOB: Jun 28, 1940, 71 y.o.   MRN: [id number]  HPI Pt presents to clinic for evaluation of cough. Notes 2d h/o left preauricular pain without ear drainage and cough productive for "creamy" sputum. +frontal ha x one week. Taking tylenol without improvement. No other alleviating or exacerbating factors. No other complaints.  Past Medical History  Diagnosis Date  . Asthma     PFT 02/06/09 FEV1 1.41 (65%), FVC 1.92 (64), FEV1% 74, TLC 3.47 (71%), DLCO 48%, +BD  . ACE-inhibitor cough   . DVT (deep venous thrombosis) 1987  . GERD (gastroesophageal reflux disease)   . Arthritis   . Atypical chest pain     s/p cath, Normal coronaries, Non ST elevation myocardial infarction, Rt groin pseudoaneurysm  . Hyperlipidemia   . Hypertension   . Depression   . Gout   . Abnormal glucose tolerance test    Past Surgical History  Procedure Date  . Appendectomy 1951  . Tubal ligation 1968  . Tonsillectomy 1950    reports that she has never smoked. She has never used smokeless tobacco. She reports that she does not drink alcohol or use illicit drugs. family history includes Asthma in her sister and Coronary artery disease in her brothers, other, and sisters. Allergies  Allergen Reactions  . Ace Inhibitors   . Indomethacin   . Montelukast Sodium     REACTION: Heart palpitations, chest pain  . Oseltamivir Phosphate     REACTION: nausea, vomiting, diarrhea, dizziness  . Sulfonamide Derivatives      Review of Systems see hpi     Objective:   Physical Exam  Nursing note and vitals reviewed. Constitutional: She appears well-developed and well-nourished. No distress.  HENT:  Head: Normocephalic and atraumatic.  Right Ear: Tympanic membrane, external ear and ear canal normal.  Left Ear: External ear and ear canal normal. No lacerations. No drainage. No foreign bodies. Tympanic membrane is injected and erythematous. Tympanic membrane is not  perforated, not retracted and not bulging.  No middle ear effusion.  Nose: Nose normal.  Mouth/Throat: Oropharynx is clear and moist. No oropharyngeal exudate.  Eyes: Conjunctivae and EOM are normal. Right eye exhibits no discharge. Left eye exhibits no discharge. No scleral icterus.  Neck: Neck supple.  Cardiovascular: Normal rate, regular rhythm and normal heart sounds.   Pulmonary/Chest: Effort normal and breath sounds normal. No respiratory distress. She has no wheezes. She has no rales.  Lymphadenopathy:    She has no cervical adenopathy.  Neurological: She is alert.  Skin: Skin is warm and dry. She is not diaphoretic.  Psychiatric: She has a normal mood and affect.          Assessment & Plan:

## 2011-04-07 ENCOUNTER — Ambulatory Visit (INDEPENDENT_AMBULATORY_CARE_PROVIDER_SITE_OTHER): Payer: MEDICARE | Admitting: Internal Medicine

## 2011-04-07 ENCOUNTER — Other Ambulatory Visit: Payer: Self-pay | Admitting: Internal Medicine

## 2011-04-07 ENCOUNTER — Encounter: Payer: Self-pay | Admitting: Internal Medicine

## 2011-04-07 ENCOUNTER — Telehealth: Payer: Self-pay | Admitting: Internal Medicine

## 2011-04-07 VITALS — BP 100/72 | HR 90 | Temp 98.3°F | Resp 20 | Wt 232.0 lb

## 2011-04-07 DIAGNOSIS — J069 Acute upper respiratory infection, unspecified: Secondary | ICD-10-CM

## 2011-04-07 DIAGNOSIS — Z79899 Other long term (current) drug therapy: Secondary | ICD-10-CM

## 2011-04-07 DIAGNOSIS — M549 Dorsalgia, unspecified: Secondary | ICD-10-CM

## 2011-04-07 DIAGNOSIS — E785 Hyperlipidemia, unspecified: Secondary | ICD-10-CM

## 2011-04-07 MED ORDER — CYCLOBENZAPRINE HCL 10 MG PO TABS: 10.0000 mg | ORAL_TABLET | Freq: Three times a day (TID) | ORAL | Status: AC | PRN

## 2011-04-07 NOTE — Assessment & Plan Note (Signed)
Resolving. Complete abx course.

## 2011-04-07 NOTE — Patient Instructions (Signed)
Please schedule chem7-v58.69 and lipid/lft 272.4 prior to next visit

## 2011-04-07 NOTE — Progress Notes (Signed)
  Subjective:    Patient ID: [name], female    DOB: 1940-09-02, 71 y.o.   MRN: [id number]  HPI Pt presents to clinic for followup of multiple medical problems. Recently seen for uri and is near completion of abx without side effects. Notes significant improvement. Notes several day h/o left lbp with radiation down left leg to level of knee. Has associated paresthesia but no weakness or urinary incontinence. Has h/o "bulging disc" and took a family member's muscle relaxer with temporary resolution of back and radicular pain. No other complaints.  Past Medical History  Diagnosis Date  . Asthma     PFT 02/06/09 FEV1 1.41 (65%), FVC 1.92 (64), FEV1% 74, TLC 3.47 (71%), DLCO 48%, +BD  . ACE-inhibitor cough   . DVT (deep venous thrombosis) 1987  . GERD (gastroesophageal reflux disease)   . Arthritis   . Atypical chest pain     s/p cath, Normal coronaries, Non ST elevation myocardial infarction, Rt groin pseudoaneurysm  . Hyperlipidemia   . Hypertension   . Depression   . Gout   . Abnormal glucose tolerance test    Past Surgical History  Procedure Date  . Appendectomy 1951  . Tubal ligation 1968  . Tonsillectomy 1950    reports that she has never smoked. She has never used smokeless tobacco. She reports that she does not drink alcohol or use illicit drugs. family history includes Asthma in her sister and Coronary artery disease in her brothers, other, and sisters. Allergies  Allergen Reactions  . Ace Inhibitors   . Indomethacin   . Montelukast Sodium     REACTION: Heart palpitations, chest pain  . Oseltamivir Phosphate     REACTION: nausea, vomiting, diarrhea, dizziness  . Sulfonamide Derivatives       Review of Systems see hpi     Objective:   Physical Exam  Physical Exam  Nursing note and vitals reviewed. Constitutional: Appears well-developed and well-nourished. No distress.  HENT:  Head: Normocephalic and atraumatic.  Right Ear: External ear normal.  Left Ear:  External ear normal.  Eyes: Conjunctivae are normal. No scleral icterus.  Neck: Neck supple. Carotid bruit is not present.  Cardiovascular: Normal rate, regular rhythm and normal heart sounds.  Exam reveals no gallop and no friction rub.   No murmur heard. Pulmonary/Chest: Effort normal and breath sounds normal. No respiratory distress. He has no wheezes. no rales.  Lymphadenopathy:    He has no cervical adenopathy.  Neurological:Alert.  Skin: Skin is warm and dry. Not diaphoretic.  Psychiatric: Has a normal mood and affect.  Msk: gait nl. Bilateral le strength 5/5.       Assessment & Plan:

## 2011-04-07 NOTE — Assessment & Plan Note (Signed)
Obtain lipid profile. 

## 2011-04-07 NOTE — Telephone Encounter (Signed)
Lab order entered for July 2013.

## 2011-04-07 NOTE — Assessment & Plan Note (Signed)
Neurologically nonfocal. Attempt flexeril prn. Cautioned regarding possible sedating effect.

## 2011-04-08 LAB — LIPID PANEL
Cholesterol: 191 mg/dL (ref 0–200)
HDL: 45 mg/dL
LDL Cholesterol: 114 mg/dL — ABNORMAL HIGH (ref 0–99)
Total CHOL/HDL Ratio: 4.2 ratio
Triglycerides: 162 mg/dL — ABNORMAL HIGH
VLDL: 32 mg/dL (ref 0–40)

## 2011-05-02 ENCOUNTER — Encounter: Payer: Self-pay | Admitting: Internal Medicine

## 2011-05-02 ENCOUNTER — Ambulatory Visit (HOSPITAL_BASED_OUTPATIENT_CLINIC_OR_DEPARTMENT_OTHER)
Admission: RE | Admit: 2011-05-02 | Discharge: 2011-05-02 | Disposition: A | Discharge status: Home / Self Care | Payer: Medicare Other | Source: Ambulatory Visit | Attending: Internal Medicine | Admitting: Internal Medicine

## 2011-05-02 ENCOUNTER — Ambulatory Visit (INDEPENDENT_AMBULATORY_CARE_PROVIDER_SITE_OTHER): Payer: MEDICARE | Admitting: Internal Medicine

## 2011-05-02 ENCOUNTER — Encounter: Payer: Self-pay | Admitting: *Deleted

## 2011-05-02 VITALS — BP 118/78 | HR 101 | Temp 98.4°F | Resp 22 | Wt 231.0 lb

## 2011-05-02 DIAGNOSIS — M7989 Other specified soft tissue disorders: Secondary | ICD-10-CM | POA: Insufficient documentation

## 2011-05-02 DIAGNOSIS — Z86718 Personal history of other venous thrombosis and embolism: Secondary | ICD-10-CM

## 2011-05-02 DIAGNOSIS — M79609 Pain in unspecified limb: Secondary | ICD-10-CM | POA: Insufficient documentation

## 2011-05-02 MED ORDER — TRAMADOL HCL 50 MG PO TABS: 50.0000 mg | ORAL_TABLET | Freq: Three times a day (TID) | ORAL | Status: AC | PRN

## 2011-05-02 NOTE — Telephone Encounter (Signed)
This encounter was created in error - please disregard.

## 2011-05-02 NOTE — Assessment & Plan Note (Signed)
Given past h/o DVT obtain LE [place]. Obtain BNP. Attempt ultram prn pain sparingly with ssri use. Followup if no improvement or worsening.

## 2011-05-02 NOTE — Progress Notes (Signed)
  Subjective:    Patient ID: [name], female    DOB: 09-28-1940, 71 y.o.   MRN: [id number]  HPI Pt presents to clinic for evaluation of leg swelling and pain. Notes two day h/o left lower leg pain and swelling. Pain located in calf and swelling is diffuse. No injury/trauma but has had ongoing left knee pain with recent steroid injxn. Does have h/o DVT in the past (pt unsure of which leg). No chest pain or dyspnea. No recent risk factors for VTE. No other complaints.  Past Medical History  Diagnosis Date  . Asthma     PFT 02/06/09 FEV1 1.41 (65%), FVC 1.92 (64), FEV1% 74, TLC 3.47 (71%), DLCO 48%, +BD  . ACE-inhibitor cough   . DVT (deep venous thrombosis) 1987  . GERD (gastroesophageal reflux disease)   . Arthritis   . Atypical chest pain     s/p cath, Normal coronaries, Non ST elevation myocardial infarction, Rt groin pseudoaneurysm  . Hyperlipidemia   . Hypertension   . Depression   . Gout   . Abnormal glucose tolerance test    Past Surgical History  Procedure Date  . Appendectomy 1951  . Tubal ligation 1968  . Tonsillectomy 1950    reports that she has never smoked. She has never used smokeless tobacco. She reports that she does not drink alcohol or use illicit drugs. family history includes Asthma in her sister and Coronary artery disease in her brothers, other, and sisters. Allergies  Allergen Reactions  . Ace Inhibitors   . Indomethacin   . Montelukast Sodium     REACTION: Heart palpitations, chest pain  . Oseltamivir Phosphate     REACTION: nausea, vomiting, diarrhea, dizziness  . Sulfonamide Derivatives      Review of Systems see hpi     Objective:   Physical Exam  Nursing note and vitals reviewed. Constitutional: She appears well-developed and well-nourished. No distress.  HENT:  Head: Normocephalic and atraumatic.  Right Ear: External ear normal.  Left Ear: External ear normal.  Eyes: Conjunctivae are normal. No scleral icterus.  Musculoskeletal:       Left lower leg: mild diffuse swelling. +calf and ant tibialis tenderness. No palpable cords. Negative homan's. Gait nl.  Neurological: She is alert.  Skin: Skin is warm and dry. She is not diaphoretic. No erythema.  Psychiatric: She has a normal mood and affect.          Assessment & Plan:

## 2011-05-03 LAB — BRAIN NATRIURETIC PEPTIDE: Brain Natriuretic Peptide: 19.2 pg/mL (ref 0.0–100.0)

## 2011-05-05 ENCOUNTER — Ambulatory Visit: Payer: MEDICARE | Admitting: Internal Medicine

## 2011-06-09 ENCOUNTER — Telehealth: Payer: Self-pay | Admitting: Internal Medicine

## 2011-06-09 MED ORDER — TRAMADOL HCL 50 MG PO TABS: 50.0000 mg | ORAL_TABLET | Freq: Three times a day (TID) | ORAL | Status: AC | PRN

## 2011-06-09 NOTE — Telephone Encounter (Signed)
Rx refill sent to pharmacy. 

## 2011-06-09 NOTE — Telephone Encounter (Signed)
#  30 rf 1

## 2011-06-09 NOTE — Telephone Encounter (Signed)
Is is okay to refill Tramadol for patient?

## 2011-06-09 NOTE — Telephone Encounter (Signed)
Refill- trama-dol hcl 50mg  tab. Take one tablet by mouth every 8 hours as needed for pain. Qty 30 last fill 4.12.13

## 2011-07-28 ENCOUNTER — Telehealth: Payer: Self-pay | Admitting: Internal Medicine

## 2011-07-28 NOTE — Telephone Encounter (Signed)
Refill- trama-dol hcl 50mg  tab. Take one tablet by mouth every 8 hrs as needed for pain. Qty 30 last fill 6.12.13

## 2011-07-29 NOTE — Telephone Encounter (Signed)
Has used 90 in less than 3 months. Advised to use sparingly due to her other medications. RF#30 only no rf.

## 2011-07-29 NOTE — Telephone Encounter (Signed)
Pt has f/u on 08/04/11. Please advise re: quantity and refills.

## 2011-07-30 MED ORDER — TRAMADOL HCL 50 MG PO TABS: 50.0000 mg | ORAL_TABLET | Freq: Three times a day (TID) | ORAL | Status: AC | PRN

## 2011-07-30 NOTE — Telephone Encounter (Signed)
Discuss with patient, Rx sent. 

## 2011-07-30 NOTE — Telephone Encounter (Signed)
Left message to call office

## 2011-07-31 ENCOUNTER — Telehealth: Payer: Self-pay | Admitting: *Deleted

## 2011-07-31 ENCOUNTER — Other Ambulatory Visit: Payer: MEDICARE

## 2011-07-31 DIAGNOSIS — Z79899 Other long term (current) drug therapy: Secondary | ICD-10-CM

## 2011-07-31 DIAGNOSIS — E785 Hyperlipidemia, unspecified: Secondary | ICD-10-CM

## 2011-07-31 DIAGNOSIS — E119 Type 2 diabetes mellitus without complications: Secondary | ICD-10-CM

## 2011-07-31 LAB — MICROALBUMIN / CREATININE URINE RATIO
Creatinine,U: 154.8 mg/dL
Microalb Creat Ratio: 0.4 mg/g (ref 0.0–30.0)

## 2011-07-31 LAB — BASIC METABOLIC PANEL
BUN: 19 mg/dL (ref 6–23)
CO2: 29 mEq/L (ref 19–32)
Chloride: 103 mEq/L (ref 96–112)
Creatinine, Ser: 1.1 mg/dL (ref 0.4–1.2)
Glucose, Bld: 108 mg/dL — ABNORMAL HIGH (ref 70–99)

## 2011-07-31 LAB — HEPATIC FUNCTION PANEL
ALT: 16 U/L (ref 0–35)
AST: 18 U/L (ref 0–37)
Total Bilirubin: 0.6 mg/dL (ref 0.3–1.2)
Total Protein: 7.7 g/dL (ref 6.0–8.3)

## 2011-07-31 LAB — LIPID PANEL
Cholesterol: 178 mg/dL (ref 0–200)
LDL Cholesterol: 95 mg/dL (ref 0–99)

## 2011-07-31 NOTE — Telephone Encounter (Signed)
Received call from [place] at [facility] that previous orders were entered for [employer]. Orders re-entered for [facility].

## 2011-08-04 ENCOUNTER — Ambulatory Visit (INDEPENDENT_AMBULATORY_CARE_PROVIDER_SITE_OTHER): Payer: MEDICARE | Admitting: Internal Medicine

## 2011-08-04 ENCOUNTER — Encounter: Payer: Self-pay | Admitting: Internal Medicine

## 2011-08-04 VITALS — BP 106/78 | HR 94 | Temp 97.8°F | Resp 20 | Wt 222.8 lb

## 2011-08-04 DIAGNOSIS — J45909 Unspecified asthma, uncomplicated: Secondary | ICD-10-CM

## 2011-08-04 DIAGNOSIS — E785 Hyperlipidemia, unspecified: Secondary | ICD-10-CM

## 2011-08-04 DIAGNOSIS — R7309 Other abnormal glucose: Secondary | ICD-10-CM

## 2011-08-04 MED ORDER — METHYLPREDNISOLONE 4 MG PO KIT: PACK | ORAL | Status: AC

## 2011-08-04 MED ORDER — ALBUTEROL SULFATE 0.63 MG/3ML IN NEBU: 1.0000 | INHALATION_SOLUTION | Freq: Four times a day (QID) | RESPIRATORY_TRACT | Status: DC | PRN

## 2011-08-04 NOTE — Progress Notes (Signed)
  Subjective:    Patient ID: [name], female    DOB: 1940-08-12, 71 y.o.   MRN: [id number]  HPI Pt presents to clinic for followup of multiple medical problems. Notes 2 wk h/o intermittent wheezing without infxn/fever/chills. Feels the weather/heat is contributing. Using albuterol neb prn at home and spiriva daily. No other alleviating or exacerbating factors. States she believes BMD utd and not due yet.   Past Medical History  Diagnosis Date  . Asthma     PFT 02/06/09 FEV1 1.41 (65%), FVC 1.92 (64), FEV1% 74, TLC 3.47 (71%), DLCO 48%, +BD  . ACE-inhibitor cough   . DVT (deep venous thrombosis) 1987  . GERD (gastroesophageal reflux disease)   . Arthritis   . Atypical chest pain     s/p cath, Normal coronaries, Non ST elevation myocardial infarction, Rt groin pseudoaneurysm  . Hyperlipidemia   . Hypertension   . Depression   . Gout   . Abnormal glucose tolerance test    Past Surgical History  Procedure Date  . Appendectomy 1951  . Tubal ligation 1968  . Tonsillectomy 1950    reports that she has never smoked. She has never used smokeless tobacco. She reports that she does not drink alcohol or use illicit drugs. family history includes Asthma in her sister and Coronary artery disease in her brothers, other, and sisters. Allergies  Allergen Reactions  . Ace Inhibitors   . Indomethacin   . Montelukast Sodium     REACTION: Heart palpitations, chest pain  . Oseltamivir Phosphate     REACTION: nausea, vomiting, diarrhea, dizziness  . Sulfonamide Derivatives       Review of Systems see hpi     Objective:   Physical Exam  Physical Exam  Nursing note and vitals reviewed. Constitutional: Appears well-developed and well-nourished. No distress.  HENT:  Head: Normocephalic and atraumatic.  Right Ear: External ear normal.  Left Ear: External ear normal.  Eyes: Conjunctivae are normal. No scleral icterus.  Neck: Neck supple. Cardiovascular: Normal rate, regular rhythm  and normal heart sounds.  Exam reveals no gallop and no friction rub.   No murmur heard. Pulmonary/Chest: Effort normal. Mild bilateral wheezing. No respiratory distress.  no rales.  Lymphadenopathy:    He has no cervical adenopathy.  Neurological:Alert.  Skin: Skin is warm and dry. Not diaphoretic.  Psychiatric: Has a normal mood and affect.        Assessment & Plan:

## 2011-08-04 NOTE — Assessment & Plan Note (Signed)
Improved. Continue statin tx.

## 2011-08-04 NOTE — Assessment & Plan Note (Signed)
Recent flare. Attempt medrol dosepak. Defers CXR. Agrees to f/u closely if no improvement or worsening.

## 2011-08-04 NOTE — Patient Instructions (Signed)
Please schedule labs prior to next visit Cbc, chem7, a1c-250.00

## 2011-08-04 NOTE — Assessment & Plan Note (Signed)
Good control. Encouraged continued weight loss.

## 2011-08-06 ENCOUNTER — Other Ambulatory Visit: Payer: Self-pay | Admitting: Internal Medicine

## 2011-08-07 ENCOUNTER — Other Ambulatory Visit: Payer: Self-pay | Admitting: *Deleted

## 2011-08-07 MED ORDER — ALBUTEROL SULFATE 0.63 MG/3ML IN NEBU: 1.0000 | INHALATION_SOLUTION | Freq: Four times a day (QID) | RESPIRATORY_TRACT | Status: DC | PRN

## 2011-08-07 NOTE — Progress Notes (Signed)
Per pt request, took print px to [facility] would not pay; request to be sent to [facility] pharmacy/SLS

## 2011-08-25 NOTE — Progress Notes (Signed)
Received PA form from [facility] for [facility], completed & faxed [phone] for Approval 07.26.13/SLS

## 2011-11-20 ENCOUNTER — Emergency Department (HOSPITAL_BASED_OUTPATIENT_CLINIC_OR_DEPARTMENT_OTHER): Payer: Medicare Other

## 2011-11-20 ENCOUNTER — Encounter (HOSPITAL_BASED_OUTPATIENT_CLINIC_OR_DEPARTMENT_OTHER): Payer: Self-pay | Admitting: *Deleted

## 2011-11-20 ENCOUNTER — Emergency Department (HOSPITAL_BASED_OUTPATIENT_CLINIC_OR_DEPARTMENT_OTHER)
Admission: EM | Admit: 2011-11-20 | Discharge: 2011-11-20 | Disposition: A | Discharge status: Home / Self Care | Payer: Medicare Other | Attending: Emergency Medicine | Admitting: Emergency Medicine

## 2011-11-20 DIAGNOSIS — E785 Hyperlipidemia, unspecified: Secondary | ICD-10-CM | POA: Insufficient documentation

## 2011-11-20 DIAGNOSIS — Z86718 Personal history of other venous thrombosis and embolism: Secondary | ICD-10-CM | POA: Insufficient documentation

## 2011-11-20 DIAGNOSIS — Y929 Unspecified place or not applicable: Secondary | ICD-10-CM | POA: Insufficient documentation

## 2011-11-20 DIAGNOSIS — Y939 Activity, unspecified: Secondary | ICD-10-CM | POA: Insufficient documentation

## 2011-11-20 DIAGNOSIS — Z8639 Personal history of other endocrine, nutritional and metabolic disease: Secondary | ICD-10-CM | POA: Insufficient documentation

## 2011-11-20 DIAGNOSIS — Z79899 Other long term (current) drug therapy: Secondary | ICD-10-CM | POA: Insufficient documentation

## 2011-11-20 DIAGNOSIS — I10 Essential (primary) hypertension: Secondary | ICD-10-CM | POA: Insufficient documentation

## 2011-11-20 DIAGNOSIS — Z8709 Personal history of other diseases of the respiratory system: Secondary | ICD-10-CM | POA: Insufficient documentation

## 2011-11-20 DIAGNOSIS — K219 Gastro-esophageal reflux disease without esophagitis: Secondary | ICD-10-CM | POA: Insufficient documentation

## 2011-11-20 DIAGNOSIS — Z8739 Personal history of other diseases of the musculoskeletal system and connective tissue: Secondary | ICD-10-CM | POA: Insufficient documentation

## 2011-11-20 DIAGNOSIS — Z8659 Personal history of other mental and behavioral disorders: Secondary | ICD-10-CM | POA: Insufficient documentation

## 2011-11-20 DIAGNOSIS — W010XXA Fall on same level from slipping, tripping and stumbling without subsequent striking against object, initial encounter: Secondary | ICD-10-CM | POA: Insufficient documentation

## 2011-11-20 DIAGNOSIS — S2249XA Multiple fractures of ribs, unspecified side, initial encounter for closed fracture: Secondary | ICD-10-CM | POA: Insufficient documentation

## 2011-11-20 DIAGNOSIS — Z862 Personal history of diseases of the blood and blood-forming organs and certain disorders involving the immune mechanism: Secondary | ICD-10-CM | POA: Insufficient documentation

## 2011-11-20 DIAGNOSIS — R7309 Other abnormal glucose: Secondary | ICD-10-CM | POA: Insufficient documentation

## 2011-11-20 LAB — URINALYSIS, ROUTINE W REFLEX MICROSCOPIC
Bilirubin Urine: NEGATIVE
Ketones, ur: NEGATIVE mg/dL
Nitrite: NEGATIVE
Specific Gravity, Urine: 1.016 (ref 1.005–1.030)
Urobilinogen, UA: 0.2 mg/dL (ref 0.0–1.0)

## 2011-11-20 LAB — CBC WITH DIFFERENTIAL/PLATELET
Basophils Relative: 0 % (ref 0–1)
Eosinophils Absolute: 0.1 10*3/uL (ref 0.0–0.7)
MCH: 30.4 pg (ref 26.0–34.0)
MCHC: 32.6 g/dL (ref 30.0–36.0)
Neutrophils Relative %: 75 % (ref 43–77)
Platelets: 182 10*3/uL (ref 150–400)
RBC: 4.14 MIL/uL (ref 3.87–5.11)

## 2011-11-20 LAB — BASIC METABOLIC PANEL
GFR calc non Af Amer: 50 mL/min — ABNORMAL LOW (ref >90)
Potassium: 4.6 mEq/L (ref 3.5–5.1)
Sodium: 141 mEq/L (ref 135–145)

## 2011-11-20 LAB — URINE MICROSCOPIC-ADD ON

## 2011-11-20 MED ORDER — HYDROCODONE-ACETAMINOPHEN 5-325 MG PO TABS: 2.0000 | ORAL_TABLET | Freq: Once | ORAL | Status: DC

## 2011-11-20 MED ORDER — HYDROMORPHONE HCL PF 1 MG/ML IJ SOLN
0.5000 mg | Freq: Once | INTRAMUSCULAR | Status: AC
  Administered 2011-11-20: 0.5 mg via INTRAVENOUS
  Filled 2011-11-20: qty 1

## 2011-11-20 MED ORDER — SODIUM CHLORIDE 0.9 % IV BOLUS (SEPSIS): 1000.0000 mL | Freq: Once | INTRAVENOUS | Status: DC

## 2011-11-20 MED ORDER — METHOCARBAMOL 500 MG PO TABS
500.0000 mg | ORAL_TABLET | Freq: Once | ORAL | Status: AC
  Administered 2011-11-20: 500 mg via ORAL
  Filled 2011-11-20: qty 1

## 2011-11-20 MED ORDER — MORPHINE SULFATE 4 MG/ML IJ SOLN
4.0000 mg | Freq: Once | INTRAMUSCULAR | Status: AC
  Administered 2011-11-20: 4 mg via INTRAVENOUS
  Filled 2011-11-20: qty 1

## 2011-11-20 MED ORDER — HYDROCODONE-ACETAMINOPHEN 5-325 MG PO TABS: 2.0000 | ORAL_TABLET | Freq: Four times a day (QID) | ORAL | Status: DC | PRN

## 2011-11-20 MED ORDER — METHOCARBAMOL 100 MG/ML IJ SOLN
500.0000 mg | Freq: Once | INTRAMUSCULAR | Status: AC
  Administered 2011-11-20: 500 mg via INTRAVENOUS
  Filled 2011-11-20: qty 10

## 2011-11-20 MED ORDER — HYDROMORPHONE HCL PF 1 MG/ML IJ SOLN: 1.0000 mg | Freq: Once | INTRAMUSCULAR | Status: DC

## 2011-11-20 MED ORDER — MECLIZINE HCL 25 MG PO TABS: 25.0000 mg | ORAL_TABLET | Freq: Once | ORAL | Status: DC

## 2011-11-20 MED ORDER — METHOCARBAMOL 500 MG PO TABS: 500.0000 mg | ORAL_TABLET | Freq: Two times a day (BID) | ORAL | Status: DC

## 2011-11-20 MED ORDER — SODIUM CHLORIDE 0.9 % IV SOLN
Freq: Once | INTRAVENOUS | Status: AC
  Administered 2011-11-20: 125 mL/h via INTRAVENOUS

## 2011-11-20 NOTE — ED Notes (Signed)
Stumped her toe on the porch tripped and fell onto concrete floor of porch no loss of consciousness having pain left rib cage area worse with deep breath

## 2011-11-20 NOTE — ED Provider Notes (Addendum)
History     CSN: [id number]  Arrival date & time 11/20/11  1735   First MD Initiated Contact with Patient 11/20/11 1746      Chief Complaint  Patient presents with  . Fall    rib pain    (Consider location/radiation/quality/duration/timing/severity/associated sxs/prior treatment) HPI Comments: Pt comes in with cc of fall. Pt had a mechanical fall earlier today, when she stubbed her toe into the stairs, and fell to the side. She never hit her head, and denies any headaches, nausea, vomiting, visual complains, seizures, altered mental status, loss of consciousness, new weakness, or numbness. Pt fell on the the left side, and her upper body hurts. She has left sided flank tenderness. She was ambulate right after the fall, but within minutes her pain intensified, and they decided to come to the ER. The pain however is not in the lower extremity or hip region, its all in the flank region, and is worse with movement, and breathing. The pain is described as sharp pain. Pt is not on any anticoagulants.  Patient is a 71 y.o. female presenting with fall. The history is provided by the patient and medical records.  Fall Associated symptoms include abdominal pain. Pertinent negatives include no nausea, no vomiting and no headaches.    Past Medical History  Diagnosis Date  . Asthma     PFT 02/06/09 FEV1 1.41 (65%), FVC 1.92 (64), FEV1% 74, TLC 3.47 (71%), DLCO 48%, +BD  . ACE-inhibitor cough   . DVT (deep venous thrombosis) 1987  . GERD (gastroesophageal reflux disease)   . Arthritis   . Atypical chest pain     s/p cath, Normal coronaries, Non ST elevation myocardial infarction, Rt groin pseudoaneurysm  . Hyperlipidemia   . Hypertension   . Depression   . Gout   . Abnormal glucose tolerance test     Past Surgical History  Procedure Date  . Appendectomy 1951  . Tubal ligation 1968  . Tonsillectomy 1950    Family History  Problem Relation Age of Onset  . Coronary artery disease  Sister   . Asthma Sister   . Coronary artery disease Brother   . Coronary artery disease Other   . Coronary artery disease Brother   . Coronary artery disease Sister   . Coronary artery disease Sister   . Coronary artery disease Brother     History  Substance Use Topics  . Smoking status: Never Smoker   . Smokeless tobacco: Never Used  . Alcohol Use: No    OB History    Grav Para Term Preterm Abortions TAB SAB Ect Mult Living                  Review of Systems  HENT: Negative for neck pain.   Respiratory: Negative for shortness of breath.   Cardiovascular: Negative for chest pain.  Gastrointestinal: Positive for abdominal pain. Negative for nausea and vomiting.  Genitourinary: Negative for dysuria.  Musculoskeletal: Positive for myalgias and back pain. Negative for gait problem.  Neurological: Negative for headaches.    Allergies  Ace inhibitors; Indomethacin; Montelukast sodium; Oseltamivir phosphate; and Sulfonamide derivatives  Home Medications   Current Outpatient Rx  Name Route Sig Dispense Refill  . ALBUTEROL SULFATE 0.63 MG/3ML IN NEBU Nebulization Take 3 mLs (0.63 mg total) by nebulization every 6 (six) hours as needed. 75 mL 11  . ALBUTEROL SULFATE HFA 108 (90 BASE) MCG/ACT IN AERS Inhalation Inhale 2 puffs into the lungs every 4 (four) hours  as needed. 3 Inhaler 3  . ALENDRONATE SODIUM 70 MG PO TABS Oral Take 1 tablet (70 mg total) by mouth every 7 (seven) days. Take with a full glass of water on an empty stomach. 12 tablet 3  . ALLOPURINOL 100 MG PO TABS Oral Take 2 tablets (200 mg total) by mouth daily. 90 tablet 3  . BUDESONIDE-FORMOTEROL FUMARATE 160-4.5 MCG/ACT IN AERO Inhalation Inhale 2 puffs into the lungs 2 (two) times daily. 3 Inhaler 3  . CETIRIZINE HCL 10 MG PO TABS Oral Take 10 mg by mouth daily.      Marland Kitchen FLUTICASONE PROPIONATE 50 MCG/ACT NA SUSP  USE TWO SPRAYS IN EACH NOSTRIL EVERY DAY 16 g 0  . FUROSEMIDE 40 MG PO TABS Oral Take 0.5 tablets (20  mg total) by mouth every other day. 90 tablet 3  . GLUCOSE BLOOD VI STRP  Use as instructed to check blood sugar once weekly. DX 790.29     . LOSARTAN POTASSIUM 50 MG PO TABS Oral Take 1 tablet (50 mg total) by mouth daily. 90 tablet 3  . PANTOPRAZOLE SODIUM 40 MG PO TBEC Oral Take 1 tablet (40 mg total) by mouth daily. 90 tablet 3  . POTASSIUM CHLORIDE CRYS ER 20 MEQ PO TBCR Oral Take 1 tablet (20 mEq total) by mouth daily. 90 tablet 3  . PRAVASTATIN SODIUM 20 MG PO TABS Oral Take 1 tablet (20 mg total) by mouth daily. 90 tablet 3  . SERTRALINE HCL 50 MG PO TABS Oral Take 1 tablet (50 mg total) by mouth daily. 90 tablet 3  . TIOTROPIUM BROMIDE MONOHYDRATE 18 MCG IN CAPS Inhalation Place 1 capsule (18 mcg total) into inhaler and inhale daily. 90 capsule 3    BP 166/77  Pulse 97  Temp 98.4 F (36.9 C) (Oral)  Resp 24  SpO2 98%  Physical Exam  Nursing note and vitals reviewed. Constitutional: She is oriented to person, place, and time. She appears well-developed and well-nourished.  HENT:  Head: Normocephalic and atraumatic.       No midline c-spine tenderness, pt able to turn head to 45 degrees bilaterally without any pain and able to flex neck to the chest and extend without any pain or neurologic symptoms.  Eyes: EOM are normal.  Neck: Neck supple.  Cardiovascular: Normal rate, regular rhythm and normal heart sounds.   No murmur heard. Pulmonary/Chest: Effort normal. No respiratory distress.       Left flank tenderness, no deformity, no ecchymoses.  Abdominal: Soft. She exhibits no distension. There is no tenderness. There is no rebound and no guarding.       Pt unable to lay down due to pain, but she has some left upper quadrant tenderness with palpation, abd is soft, no rebound or guarding.  Neurological: She is alert and oriented to person, place, and time.  Skin: Skin is warm and dry.    ED Course  [place] bedside Date/Time: 11/20/2011 11:10 PM Performed by: Varney Biles Authorized by: Varney Biles Consent: Verbal consent obtained. Consent given by: patient Local anesthesia used: no Patient sedated: no Comments: Bedside FAST shows no free fluid in the abdomen - specifically in the splenorenal recess.   (including critical care time)   Labs Reviewed  URINALYSIS, ROUTINE W REFLEX MICROSCOPIC  CBC WITH DIFFERENTIAL  BASIC METABOLIC PANEL   No results found.   No diagnosis found.    MDM  Pt comes in post fall. She is having left flank and left abd  tenderness, severe, unable to lay. The pain is worse with certain position, walking and with inspiration. Exam limited due to her inability to lay. We will get analgesic on board and get Xrays and i will reassess, and try even a FAST exam at the bedside. Will consider CT scan. Basic labs including UA ordered.     Varney Biles, MD 11/20/11 1844  Bedside US is FAST negative. Rib fractures - will d.c,  Varney Biles, MD 11/20/11 [place], MD 11/20/11 2311

## 2011-12-01 ENCOUNTER — Ambulatory Visit: Payer: Medicare Other | Admitting: Internal Medicine

## 2011-12-08 ENCOUNTER — Ambulatory Visit (INDEPENDENT_AMBULATORY_CARE_PROVIDER_SITE_OTHER): Payer: Medicare Other | Admitting: Internal Medicine

## 2011-12-08 ENCOUNTER — Encounter: Payer: Self-pay | Admitting: Internal Medicine

## 2011-12-08 VITALS — BP 130/82 | HR 86 | Temp 98.3°F | Resp 18 | Wt 233.8 lb

## 2011-12-08 DIAGNOSIS — Z23 Encounter for immunization: Secondary | ICD-10-CM

## 2011-12-08 DIAGNOSIS — M899 Disorder of bone, unspecified: Secondary | ICD-10-CM

## 2011-12-08 DIAGNOSIS — E785 Hyperlipidemia, unspecified: Secondary | ICD-10-CM

## 2011-12-08 DIAGNOSIS — R7309 Other abnormal glucose: Secondary | ICD-10-CM

## 2011-12-08 DIAGNOSIS — I10 Essential (primary) hypertension: Secondary | ICD-10-CM

## 2011-12-08 DIAGNOSIS — M949 Disorder of cartilage, unspecified: Secondary | ICD-10-CM

## 2011-12-08 DIAGNOSIS — S2239XA Fracture of one rib, unspecified side, initial encounter for closed fracture: Secondary | ICD-10-CM

## 2011-12-08 DIAGNOSIS — E119 Type 2 diabetes mellitus without complications: Secondary | ICD-10-CM

## 2011-12-08 DIAGNOSIS — M8580 Other specified disorders of bone density and structure, unspecified site: Secondary | ICD-10-CM

## 2011-12-08 LAB — LIPID PANEL
HDL: 51 mg/dL (ref >39)
LDL Cholesterol: 77 mg/dL (ref 0–99)
Total CHOL/HDL Ratio: 3 Ratio
VLDL: 25 mg/dL (ref 0–40)

## 2011-12-08 LAB — HEPATIC FUNCTION PANEL
Albumin: 4.6 g/dL (ref 3.5–5.2)
Bilirubin, Direct: 0.1 mg/dL (ref 0.0–0.3)
Total Bilirubin: 0.5 mg/dL (ref 0.3–1.2)

## 2011-12-12 NOTE — Assessment & Plan Note (Signed)
Obtain hemoglobin A1c 

## 2011-12-12 NOTE — Assessment & Plan Note (Signed)
Obtain fasting lipid profile and liver function tests. 

## 2011-12-12 NOTE — Assessment & Plan Note (Signed)
Schedule followup bone density

## 2011-12-12 NOTE — Progress Notes (Signed)
  Subjective:    Patient ID: [name], female    DOB: 1940/09/17, 71 y.o.   MRN: [id number]  HPI Pt presents to clinic for followup of multiple medical problems. Patient recently fell striking her left side and suffering left 10th and 11th rib fracture without pneumothorax. Pain is well-controlled taking mostly Tylenol. No shortness of breath. Does have past history of osteopenia. Blood pressure reviewed as normotensive.  Past Medical History  Diagnosis Date  . Asthma     PFT 02/06/09 FEV1 1.41 (65%), FVC 1.92 (64), FEV1% 74, TLC 3.47 (71%), DLCO 48%, +BD  . ACE-inhibitor cough   . DVT (deep venous thrombosis) 1987  . GERD (gastroesophageal reflux disease)   . Arthritis   . Atypical chest pain     s/p cath, Normal coronaries, Non ST elevation myocardial infarction, Rt groin pseudoaneurysm  . Hyperlipidemia   . Hypertension   . Depression   . Gout   . Abnormal glucose tolerance test    Past Surgical History  Procedure Date  . Appendectomy 1951  . Tubal ligation 1968  . Tonsillectomy 1950    reports that she has never smoked. She has never used smokeless tobacco. She reports that she does not drink alcohol or use illicit drugs. family history includes Asthma in her sister and Coronary artery disease in her brothers, other, and sisters. Allergies  Allergen Reactions  . Ace Inhibitors   . Indomethacin   . Montelukast Sodium     REACTION: Heart palpitations, chest pain  . Oseltamivir Phosphate     REACTION: nausea, vomiting, diarrhea, dizziness  . Sulfonamide Derivatives       Review of Systems see hpi     Objective:   Physical Exam  Physical Exam  Nursing note and vitals reviewed. Constitutional: Appears well-developed and well-nourished. No distress.  HENT:  Head: Normocephalic and atraumatic.  Right Ear: External ear normal.  Left Ear: External ear normal.  Eyes: Conjunctivae are normal. No scleral icterus.  Neck: Neck supple. Carotid bruit is not present.    Cardiovascular: Normal rate, regular rhythm and normal heart sounds.  Exam reveals no gallop and no friction rub.   No murmur heard. Pulmonary/Chest: Effort normal and breath sounds normal. No respiratory distress. He has no wheezes. no rales.  Lymphadenopathy:    He has no cervical adenopathy.  Neurological:Alert.  Skin: Skin is warm and dry. Not diaphoretic.  Psychiatric: Has a normal mood and affect.        Assessment & Plan:

## 2011-12-12 NOTE — Assessment & Plan Note (Signed)
Normotensive and stable. Continue current regimen. Monitor bp as outpt and followup in clinic as scheduled.  

## 2011-12-17 ENCOUNTER — Ambulatory Visit (INDEPENDENT_AMBULATORY_CARE_PROVIDER_SITE_OTHER)
Admission: RE | Admit: 2011-12-17 | Discharge: 2011-12-17 | Disposition: A | Discharge status: Home / Self Care | Payer: Medicare Other | Source: Ambulatory Visit

## 2011-12-17 DIAGNOSIS — S2239XA Fracture of one rib, unspecified side, initial encounter for closed fracture: Secondary | ICD-10-CM

## 2011-12-17 DIAGNOSIS — M8580 Other specified disorders of bone density and structure, unspecified site: Secondary | ICD-10-CM

## 2011-12-17 DIAGNOSIS — M899 Disorder of bone, unspecified: Secondary | ICD-10-CM

## 2011-12-24 ENCOUNTER — Encounter: Payer: Self-pay | Admitting: *Deleted

## 2011-12-29 ENCOUNTER — Telehealth: Payer: Self-pay | Admitting: Internal Medicine

## 2011-12-29 MED ORDER — TRAMADOL HCL 50 MG PO TABS: 50.0000 mg | ORAL_TABLET | Freq: Three times a day (TID) | ORAL | Status: DC | PRN

## 2011-12-29 NOTE — Telephone Encounter (Signed)
Pt requesting refill of Tramadol hcl 50 mg. One tablet po every 8 hrs prn for pain. #30. Last refill 07-30-11. Please advise.

## 2011-12-29 NOTE — Telephone Encounter (Signed)
Refill-tramadol hcl 50mg  tab. Take one tablet by mouth every 8 hours as needed for pain. Qty 30 last fill 7.10.13

## 2011-12-29 NOTE — Telephone Encounter (Signed)
#  30 rf1

## 2012-01-27 ENCOUNTER — Telehealth: Payer: Self-pay | Admitting: Internal Medicine

## 2012-01-27 MED ORDER — ALLOPURINOL 100 MG PO TABS: 200.0000 mg | ORAL_TABLET | Freq: Every day | ORAL | Status: DC

## 2012-01-27 MED ORDER — PANTOPRAZOLE SODIUM 40 MG PO TBEC: 40.0000 mg | DELAYED_RELEASE_TABLET | Freq: Every day | ORAL | Status: DC

## 2012-01-27 NOTE — Telephone Encounter (Signed)
Rx[s] to pharmacy/SLS 

## 2012-01-27 NOTE — Telephone Encounter (Signed)
Refill-allopurinol 100mg  tab. Take two by mouth daily. Qty 90 days supply  Refill- pantoprazole 40mg  tab. Take one by mouth daily. Qty 90 days supply

## 2012-01-28 ENCOUNTER — Telehealth: Payer: Self-pay | Admitting: Internal Medicine

## 2012-01-28 MED ORDER — BUDESONIDE-FORMOTEROL FUMARATE 160-4.5 MCG/ACT IN AERO: 2.0000 | INHALATION_SPRAY | Freq: Two times a day (BID) | RESPIRATORY_TRACT | Status: DC

## 2012-01-28 MED ORDER — SERTRALINE HCL 50 MG PO TABS: 50.0000 mg | ORAL_TABLET | Freq: Every day | ORAL | Status: DC

## 2012-01-28 MED ORDER — POTASSIUM CHLORIDE CRYS ER 20 MEQ PO TBCR: 20.0000 meq | EXTENDED_RELEASE_TABLET | Freq: Every day | ORAL | Status: DC

## 2012-01-28 NOTE — Telephone Encounter (Signed)
Rx[s] to pharmacy/SLS 

## 2012-01-28 NOTE — Telephone Encounter (Signed)
Refill- symbicort aer 160-4.5. Use 2 puffs into the lungs two times daily. Qty 90 days supply  Refill- klor con M20 ER 20MEQ tab. Take one by mouth daily. Qty 90 days supply  Refill- sertraline 50mg  tab. Take one by mouth daily. Qty 90 days supply

## 2012-01-30 ENCOUNTER — Telehealth: Payer: Self-pay | Admitting: Internal Medicine

## 2012-01-30 NOTE — Telephone Encounter (Signed)
Refill-tramadol hcl 50mg  tab. Take one tablet by mouth every 8 hours as needed for pain. Qty 30 last fill 12.9.13

## 2012-01-30 NOTE — Telephone Encounter (Signed)
Last Rx 12.09.13 given verbally via telephone with [1] one refill; verified with pharmacy/Michelle that Rx was to have [1] available refill/SLS

## 2012-02-09 ENCOUNTER — Other Ambulatory Visit: Payer: Self-pay | Admitting: *Deleted

## 2012-02-09 MED ORDER — LOSARTAN POTASSIUM 50 MG PO TABS: 50.0000 mg | ORAL_TABLET | Freq: Every day | ORAL | Status: DC

## 2012-02-09 MED ORDER — TIOTROPIUM BROMIDE MONOHYDRATE 18 MCG IN CAPS: 18.0000 ug | ORAL_CAPSULE | Freq: Every day | RESPIRATORY_TRACT | Status: DC

## 2012-02-09 NOTE — Telephone Encounter (Signed)
Rx[s] to pharmacy/SLS 

## 2012-02-13 ENCOUNTER — Ambulatory Visit (INDEPENDENT_AMBULATORY_CARE_PROVIDER_SITE_OTHER): Payer: Medicare Other | Admitting: Internal Medicine

## 2012-02-13 ENCOUNTER — Encounter: Payer: Self-pay | Admitting: Internal Medicine

## 2012-02-13 VITALS — BP 132/82 | HR 96 | Temp 97.5°F | Ht 65.0 in | Wt 234.5 lb

## 2012-02-13 DIAGNOSIS — J45901 Unspecified asthma with (acute) exacerbation: Secondary | ICD-10-CM

## 2012-02-13 MED ORDER — METHYLPREDNISOLONE (PAK) 4 MG PO TABS: ORAL_TABLET | ORAL | Status: DC

## 2012-02-13 NOTE — Patient Instructions (Signed)

## 2012-02-13 NOTE — Progress Notes (Signed)
HPI  Pt presents to the clinic today with c/o wheezing and shortness of breath x 2 days. She does have a history of chronic asthma and feels like she is having a acute exacerbation. She has been using her inhalers as well as her nebulizer. She is only getting mild relief with that treatment. The wheezing is worse when she lays down at night. It has been quite a few months since her last exacerbation. She has had sick contacts.  Review of Systems      Past Medical History  Diagnosis Date  . Asthma     PFT 02/06/09 FEV1 1.41 (65%), FVC 1.92 (64), FEV1% 74, TLC 3.47 (71%), DLCO 48%, +BD  . ACE-inhibitor cough   . DVT (deep venous thrombosis) 1987  . GERD (gastroesophageal reflux disease)   . Arthritis   . Atypical chest pain     s/p cath, Normal coronaries, Non ST elevation myocardial infarction, Rt groin pseudoaneurysm  . Hyperlipidemia   . Hypertension   . Depression   . Gout   . Abnormal glucose tolerance test     Family History  Problem Relation Age of Onset  . Coronary artery disease Sister   . Asthma Sister   . Coronary artery disease Brother   . Coronary artery disease Other   . Coronary artery disease Brother   . Coronary artery disease Sister   . Coronary artery disease Sister   . Coronary artery disease Brother     History   Social History  . Marital Status: Widowed    Spouse Name: N/A    Number of Children: 1  . Years of Education: N/A   Occupational History  . COLLECTIONS     [employer]   Social History Main Topics  . Smoking status: Never Smoker   . Smokeless tobacco: Never Used  . Alcohol Use: No  . Drug Use: No  . Sexually Active: Not on file   Other Topics Concern  . Not on file   Social History Narrative  . No narrative on file    Allergies  Allergen Reactions  . Ace Inhibitors   . Indomethacin   . Montelukast Sodium     REACTION: Heart palpitations, chest pain  . Oseltamivir Phosphate     REACTION: nausea, vomiting, diarrhea,  dizziness  . Sulfonamide Derivatives      Constitutional: Denies fatigue, headache, fever or abrupt weight changes.  HEENT:  Denies eye redness, eye pain, pressure behind the eyes, facial pain, nasal congestion, ear pain, ringing in the ears, wax buildup, runny nose or bloody nose. Respiratory: Positive cough and shortness of breath. Denies difficulty breathing.  Cardiovascular: Denies chest pain, chest tightness, palpitations or swelling in the hands or feet.   No other specific complaints in a complete review of systems (except as listed in HPI above).  Objective:   BP 132/82  Pulse 96  Temp 97.5 F (36.4 C) (Oral)  Ht 5\' 5"  (1.651 m)  Wt 234 lb 8 oz (106.369 kg)  BMI 39.02 kg/m2  SpO2 93% Wt Readings from Last 3 Encounters:  02/13/12 234 lb 8 oz (106.369 kg)  12/08/11 233 lb 12 oz (106.028 kg)  08/04/11 222 lb 12 oz (101.039 kg)     General: Appears her stated age, well developed, well nourished in NAD. HEENT: Head: normal shape and size; Eyes: sclera white, no icterus, conjunctiva pink, PERRLA and EOMs intact; Ears: Tm's gray and intact, normal light reflex; Nose: mucosa pink and moist, septum midline; Throat/Mouth: +  PND. Teeth present, mucosa erythematous and moist, no exudate noted, no lesions or ulcerations noted.  Neck: Neck supple, trachea midline. No massses, lumps or thyromegaly present.  Cardiovascular: Normal rate and rhythm. S1,S2 noted.  No murmur, rubs or gallops noted. No JVD or BLE edema. No carotid bruits noted. Pulmonary/Chest: Normal effort and bilateral inspiratory and expiratory wheezes. No respiratory distress. No rales or ronchi noted.      Assessment & Plan:   Acute asthma exacerbation, new onset with additional workup required:  Get some rest and drink plenty of water Medrol dose pack Continue inhalers and nebulizers  RTC as needed or if symptoms persist.

## 2012-03-08 ENCOUNTER — Telehealth: Payer: Self-pay | Admitting: Internal Medicine

## 2012-03-08 MED ORDER — TRAMADOL HCL 50 MG PO TABS: 50.0000 mg | ORAL_TABLET | Freq: Three times a day (TID) | ORAL | Status: DC | PRN

## 2012-03-08 NOTE — Telephone Encounter (Signed)
Refill- tramadol hcl 50mg  tab. Take one tablet by mouth every 8 hours as needed for pain. Qty 30 last fill 1.10.14

## 2012-04-06 ENCOUNTER — Ambulatory Visit: Payer: Medicare Other | Admitting: Family Medicine

## 2012-04-13 ENCOUNTER — Telehealth: Payer: Self-pay | Admitting: Internal Medicine

## 2012-04-13 MED ORDER — ALENDRONATE SODIUM 70 MG PO TABS: 70.0000 mg | ORAL_TABLET | ORAL | Status: DC

## 2012-04-13 NOTE — Telephone Encounter (Signed)
Refill- alendronate 70mg  tab. Take one by mouth every 7 days with a full glass of water on an empty stomach. Qty 90 days supply

## 2012-04-20 ENCOUNTER — Ambulatory Visit (INDEPENDENT_AMBULATORY_CARE_PROVIDER_SITE_OTHER): Payer: Medicare Other | Admitting: Family Medicine

## 2012-04-20 ENCOUNTER — Telehealth: Payer: Self-pay | Admitting: Family Medicine

## 2012-04-20 ENCOUNTER — Encounter: Payer: Self-pay | Admitting: Family Medicine

## 2012-04-20 VITALS — BP 124/82 | HR 92 | Temp 97.8°F | Ht 65.0 in | Wt 226.1 lb

## 2012-04-20 DIAGNOSIS — Z5189 Encounter for other specified aftercare: Secondary | ICD-10-CM

## 2012-04-20 DIAGNOSIS — M549 Dorsalgia, unspecified: Secondary | ICD-10-CM

## 2012-04-20 DIAGNOSIS — J45909 Unspecified asthma, uncomplicated: Secondary | ICD-10-CM

## 2012-04-20 DIAGNOSIS — M1990 Unspecified osteoarthritis, unspecified site: Secondary | ICD-10-CM

## 2012-04-20 DIAGNOSIS — I10 Essential (primary) hypertension: Secondary | ICD-10-CM

## 2012-04-20 DIAGNOSIS — E785 Hyperlipidemia, unspecified: Secondary | ICD-10-CM

## 2012-04-20 DIAGNOSIS — T7840XD Allergy, unspecified, subsequent encounter: Secondary | ICD-10-CM

## 2012-04-20 MED ORDER — TRAMADOL HCL 50 MG PO TABS: 50.0000 mg | ORAL_TABLET | Freq: Three times a day (TID) | ORAL | Status: DC | PRN

## 2012-04-20 MED ORDER — MONTELUKAST SODIUM 10 MG PO TABS: 10.0000 mg | ORAL_TABLET | Freq: Every day | ORAL | Status: DC

## 2012-04-20 MED ORDER — BACLOFEN 10 MG PO TABS: 10.0000 mg | ORAL_TABLET | Freq: Three times a day (TID) | ORAL | Status: DC | PRN

## 2012-04-20 NOTE — Telephone Encounter (Signed)
Patient has upcoming appointment 07/20/12, she will be going to [facility] lab prior to this appt. Please order labs

## 2012-04-20 NOTE — Telephone Encounter (Signed)
Which labs need to be ordered?

## 2012-04-20 NOTE — Telephone Encounter (Signed)
She is supposed to call the week prior to her appt. So I can order labs

## 2012-04-21 ENCOUNTER — Encounter: Payer: Self-pay | Admitting: Family Medicine

## 2012-04-21 NOTE — Assessment & Plan Note (Signed)
Encouraged NSAIDs, Krill iol caps, continue exercise as tolerated.

## 2012-04-21 NOTE — Progress Notes (Signed)
Patient ID: [name], female   DOB: Oct 23, 1940, 72 y.o.   MRN: [id number] CHAISTY SAGERS [id number] Jul 03, 1940 04/21/2012      Progress Note-Follow Up  Subjective  Chief Complaint  Chief Complaint  Patient presents with  . Follow-up    4 month    HPI Patient is a 72 year old Caucasian female who is in today for routine followup. She does not have any recent illness but is complaining of some worsening stiffness and instability in her left hip and left knee. She notes frequently her but we'll closely with him and that has been happening off and on for a couple of years. Denies recent injury or fall. No chest pain, palpitations, shortness of breath, GI or GU concerns otherwise noted at today's visit.  Past Medical History  Diagnosis Date  . Asthma     PFT 02/06/09 FEV1 1.41 (65%), FVC 1.92 (64), FEV1% 74, TLC 3.47 (71%), DLCO 48%, +BD  . ACE-inhibitor cough   . DVT (deep venous thrombosis) 1987  . GERD (gastroesophageal reflux disease)   . Arthritis   . Atypical chest pain     s/p cath, Normal coronaries, Non ST elevation myocardial infarction, Rt groin pseudoaneurysm  . Hyperlipidemia   . Hypertension   . Depression   . Gout   . Abnormal glucose tolerance test     Past Surgical History  Procedure Laterality Date  . Appendectomy  1951  . Tubal ligation  1968  . Tonsillectomy  1950    Family History  Problem Relation Age of Onset  . Coronary artery disease Sister   . Asthma Sister   . Coronary artery disease Brother   . Coronary artery disease Other   . Coronary artery disease Brother   . Coronary artery disease Sister   . Coronary artery disease Sister   . Coronary artery disease Brother     History   Social History  . Marital Status: Widowed    Spouse Name: N/A    Number of Children: 1  . Years of Education: N/A   Occupational History  . COLLECTIONS     [employer]   Social History Main Topics  . Smoking status: Never Smoker   . Smokeless tobacco:  Never Used  . Alcohol Use: No  . Drug Use: No  . Sexually Active: Not on file   Other Topics Concern  . Not on file   Social History Narrative  . No narrative on file    Current Outpatient Prescriptions on File Prior to Visit  Medication Sig Dispense Refill  . albuterol (ACCUNEB) 0.63 MG/3ML nebulizer solution Take 3 mLs (0.63 mg total) by nebulization every 6 (six) hours as needed.  75 mL  11  . albuterol (PROAIR HFA) 108 (90 BASE) MCG/ACT inhaler Inhale 2 puffs into the lungs every 4 (four) hours as needed.  3 Inhaler  3  . alendronate (FOSAMAX) 70 MG tablet Take 1 tablet (70 mg total) by mouth every 7 (seven) days. Take with a full glass of water on an empty stomach.  12 tablet  3  . allopurinol (ZYLOPRIM) 100 MG tablet Take 2 tablets (200 mg total) by mouth daily.  180 tablet  3  . budesonide-formoterol (SYMBICORT) 160-4.5 MCG/ACT inhaler Inhale 2 puffs into the lungs 2 (two) times daily.  3 Inhaler  3  . cetirizine (ZYRTEC) 10 MG tablet Take 10 mg by mouth daily.        . fluticasone (FLONASE) 50 MCG/ACT nasal spray USE TWO  SPRAYS IN EACH NOSTRIL EVERY DAY  16 g  0  . furosemide (LASIX) 40 MG tablet Take 0.5 tablets (20 mg total) by mouth every other day.  90 tablet  3  . glucose blood (ONE TOUCH TEST STRIPS) test strip Use as instructed to check blood sugar once weekly. DX 790.29       . HYDROcodone-acetaminophen (NORCO/VICODIN) 5-325 MG per tablet Take 2 tablets by mouth every 6 (six) hours as needed for pain.  30 tablet  0  . losartan (COZAAR) 50 MG tablet Take 1 tablet (50 mg total) by mouth daily.  90 tablet  3  . methylPREDNIsolone (MEDROL DOSPACK) 4 MG tablet follow package directions  21 tablet  0  . pantoprazole (PROTONIX) 40 MG tablet Take 1 tablet (40 mg total) by mouth daily.  90 tablet  3  . potassium chloride SA (KLOR-CON M20) 20 MEQ tablet Take 1 tablet (20 mEq total) by mouth daily.  90 tablet  3  . pravastatin (PRAVACHOL) 20 MG tablet Take 1 tablet (20 mg total) by  mouth daily.  90 tablet  3  . sertraline (ZOLOFT) 50 MG tablet Take 1 tablet (50 mg total) by mouth daily.  90 tablet  3  . tiotropium (SPIRIVA) 18 MCG inhalation capsule Place 1 capsule (18 mcg total) into inhaler and inhale daily.  90 capsule  3   No current facility-administered medications on file prior to visit.    Allergies  Allergen Reactions  . Ace Inhibitors   . Indomethacin   . Montelukast Sodium     REACTION: Heart palpitations, chest pain  . Oseltamivir Phosphate     REACTION: nausea, vomiting, diarrhea, dizziness  . Sulfonamide Derivatives     Review of Systems  Review of Systems  Constitutional: Negative for fever and malaise/fatigue.  HENT: Negative for congestion.   Eyes: Negative for discharge.  Respiratory: Negative for shortness of breath.   Cardiovascular: Negative for chest pain, palpitations and leg swelling.  Gastrointestinal: Negative for nausea, abdominal pain and diarrhea.  Genitourinary: Negative for dysuria.  Musculoskeletal: Positive for joint pain. Negative for falls.  Skin: Negative for rash.  Neurological: Positive for sensory change. Negative for loss of consciousness and headaches.       Left foot goes to sleep at times  Endo/Heme/Allergies: Negative for polydipsia.  Psychiatric/Behavioral: Negative for depression and suicidal ideas. The patient is not nervous/anxious and does not have insomnia.     Objective  BP 124/82  Pulse 92  Temp(Src) 97.8 F (36.6 C) (Oral)  Ht 5\' 5"  (1.651 m)  Wt 226 lb 1.9 oz (102.567 kg)  BMI 37.63 kg/m2  SpO2 92%  Physical Exam  Physical Exam  Constitutional: She is oriented to person, place, and time and well-developed, well-nourished, and in no distress. No distress.  HENT:  Head: Normocephalic and atraumatic.  Eyes: Conjunctivae are normal.  Neck: Neck supple. No thyromegaly present.  Cardiovascular: Normal rate, regular rhythm and normal heart sounds.   No murmur heard. Pulmonary/Chest: Effort  normal and breath sounds normal. She has no wheezes.  Abdominal: She exhibits no distension and no mass.  Musculoskeletal: She exhibits no edema.  Lymphadenopathy:    She has no cervical adenopathy.  Neurological: She is alert and oriented to person, place, and time.  Skin: Skin is warm and dry. No rash noted. She is not diaphoretic.  Psychiatric: Memory, affect and judgment normal.    Lab Results  Component Value Date   TSH 1.541 04/23/2009  Lab Results  Component Value Date   WBC 8.9 11/20/2011   HGB 12.6 11/20/2011   HCT 38.7 11/20/2011   MCV 93.5 11/20/2011   PLT 182 11/20/2011   Lab Results  Component Value Date   CREATININE 1.10 11/20/2011   BUN 22 11/20/2011   NA 141 11/20/2011   K 4.6 11/20/2011   CL 103 11/20/2011   CO2 25 11/20/2011   Lab Results  Component Value Date   ALT 10 12/08/2011   AST 15 12/08/2011   ALKPHOS 54 12/08/2011   BILITOT 0.5 12/08/2011   Lab Results  Component Value Date   CHOL 153 12/08/2011   Lab Results  Component Value Date   HDL 51 12/08/2011   Lab Results  Component Value Date   LDLCALC 77 12/08/2011   Lab Results  Component Value Date   TRIG 127 12/08/2011   Lab Results  Component Value Date   CHOLHDL 3.0 12/08/2011     Assessment & Plan  HYPERTENSION Well controlled, no changes to meds  ASTHMA Disease well controlled at this time, no change to meds  OSTEOARTHRITIS Encouraged NSAIDs, Krill iol caps, continue exercise as tolerated.   HYPERLIPIDEMIA Tolerating Pravastatin. Avoid trans fats.

## 2012-04-21 NOTE — Assessment & Plan Note (Signed)
Tolerating Pravastatin. Avoid trans fats.

## 2012-04-21 NOTE — Telephone Encounter (Signed)
Ok i will close note until then

## 2012-04-21 NOTE — Assessment & Plan Note (Signed)
Well controlled, no changes to meds 

## 2012-04-21 NOTE — Assessment & Plan Note (Signed)
Disease well controlled at this time, no change to meds

## 2012-04-22 ENCOUNTER — Ambulatory Visit: Payer: Medicare Other | Admitting: Family Medicine

## 2012-04-26 ENCOUNTER — Other Ambulatory Visit: Payer: Self-pay | Admitting: *Deleted

## 2012-04-26 MED ORDER — PRAVASTATIN SODIUM 20 MG PO TABS: 20.0000 mg | ORAL_TABLET | Freq: Every day | ORAL | Status: DC

## 2012-04-26 NOTE — Telephone Encounter (Signed)
Rx request to pharmacy/SLS  

## 2012-06-24 ENCOUNTER — Telehealth: Payer: Self-pay | Admitting: Family Medicine

## 2012-06-24 MED ORDER — PRAVASTATIN SODIUM 20 MG PO TABS: 20.0000 mg | ORAL_TABLET | Freq: Every day | ORAL | Status: DC

## 2012-06-24 NOTE — Telephone Encounter (Signed)
Pravastatin 20 mg take 1 by mouth  Daily  Qty 90

## 2012-06-25 ENCOUNTER — Telehealth: Payer: Self-pay | Admitting: *Deleted

## 2012-06-25 NOTE — Telephone Encounter (Signed)
Pt dropped of application for handicap placard. Pt wants [place] to call her at [phone] when form is ready for pick up. Form forwarded to Provider for completion/signature.

## 2012-06-25 NOTE — Telephone Encounter (Signed)
Done, just need last note

## 2012-06-29 NOTE — Telephone Encounter (Signed)
Called and advised Pt form complete and ready for pickup.

## 2012-07-07 ENCOUNTER — Ambulatory Visit: Payer: Medicare Other | Admitting: Family Medicine

## 2012-07-08 ENCOUNTER — Ambulatory Visit: Payer: Medicare Other | Admitting: Family

## 2012-07-20 ENCOUNTER — Ambulatory Visit (INDEPENDENT_AMBULATORY_CARE_PROVIDER_SITE_OTHER): Payer: Medicare Other | Admitting: Family Medicine

## 2012-07-20 ENCOUNTER — Encounter: Payer: Self-pay | Admitting: Family Medicine

## 2012-07-20 VITALS — BP 118/66 | HR 102 | Temp 98.2°F | Ht 65.0 in | Wt 230.1 lb

## 2012-07-20 DIAGNOSIS — K219 Gastro-esophageal reflux disease without esophagitis: Secondary | ICD-10-CM

## 2012-07-20 DIAGNOSIS — M549 Dorsalgia, unspecified: Secondary | ICD-10-CM

## 2012-07-20 DIAGNOSIS — R739 Hyperglycemia, unspecified: Secondary | ICD-10-CM

## 2012-07-20 DIAGNOSIS — I10 Essential (primary) hypertension: Secondary | ICD-10-CM

## 2012-07-20 DIAGNOSIS — E785 Hyperlipidemia, unspecified: Secondary | ICD-10-CM

## 2012-07-20 DIAGNOSIS — R7309 Other abnormal glucose: Secondary | ICD-10-CM

## 2012-07-20 LAB — HEPATIC FUNCTION PANEL
ALT: 14 U/L (ref 0–35)
AST: 16 U/L (ref 0–37)
Alkaline Phosphatase: 50 U/L (ref 39–117)
Indirect Bilirubin: 0.4 mg/dL (ref 0.0–0.9)
Total Protein: 7 g/dL (ref 6.0–8.3)

## 2012-07-20 LAB — HEMOGLOBIN A1C
Hgb A1c MFr Bld: 5.8 % — ABNORMAL HIGH (ref <5.7)
Mean Plasma Glucose: 120 mg/dL — ABNORMAL HIGH (ref <117)

## 2012-07-20 LAB — RENAL FUNCTION PANEL
Albumin: 4.2 g/dL (ref 3.5–5.2)
BUN: 23 mg/dL (ref 6–23)
Chloride: 104 mEq/L (ref 96–112)
Creat: 1.25 mg/dL — ABNORMAL HIGH (ref 0.50–1.10)
Phosphorus: 3.2 mg/dL (ref 2.3–4.6)

## 2012-07-20 LAB — LIPID PANEL
Cholesterol: 151 mg/dL (ref 0–200)
LDL Cholesterol: 79 mg/dL (ref 0–99)
Triglycerides: 104 mg/dL (ref <150)

## 2012-07-20 MED ORDER — TRAMADOL HCL 50 MG PO TABS: 50.0000 mg | ORAL_TABLET | Freq: Three times a day (TID) | ORAL | Status: DC | PRN

## 2012-07-20 NOTE — Assessment & Plan Note (Signed)
Well controlled, no changes today 

## 2012-07-20 NOTE — Assessment & Plan Note (Signed)
Avoid trans fats, well controlled on Pravastatin

## 2012-07-20 NOTE — Patient Instructions (Addendum)
Consider Salon Pas patches on back and cream on hands  Back Pain, Adult Low back pain is very common. About 1 in 5 people have back pain.The cause of low back pain is rarely dangerous. The pain often gets better over time.About half of people with a sudden onset of back pain feel better in just 2 weeks. About 8 in 10 people feel better by 6 weeks.  CAUSES Some common causes of back pain include:  Strain of the muscles or ligaments supporting the spine.  Wear and tear (degeneration) of the spinal discs.  Arthritis.  Direct injury to the back. DIAGNOSIS Most of the time, the direct cause of low back pain is not known.However, back pain can be treated effectively even when the exact cause of the pain is unknown.Answering your caregiver's questions about your overall health and symptoms is one of the most accurate ways to make sure the cause of your pain is not dangerous. If your caregiver needs more information, he or she may order lab work or imaging tests (X-rays or MRIs).However, even if imaging tests show changes in your back, this usually does not require surgery. HOME CARE INSTRUCTIONS For many people, back pain returns.Since low back pain is rarely dangerous, it is often a condition that people can learn to [facility] their own.   Remain active. It is stressful on the back to sit or stand in one place. Do not sit, drive, or stand in one place for more than 30 minutes at a time. Take short walks on level surfaces as soon as pain allows.Try to increase the length of time you walk each day.  Do not stay in bed.Resting more than 1 or 2 days can delay your recovery.  Do not avoid exercise or work.Your body is made to move.It is not dangerous to be active, even though your back may hurt.Your back will likely heal faster if you return to being active before your pain is gone.  Pay attention to your body when you bend and lift. Many people have less discomfortwhen lifting if they  bend their knees, keep the load close to their bodies,and avoid twisting. Often, the most comfortable positions are those that put less stress on your recovering back.  Find a comfortable position to sleep. Use a firm mattress and lie on your side with your knees slightly bent. If you lie on your back, put a pillow under your knees.  Only take over-the-counter or prescription medicines as directed by your caregiver. Over-the-counter medicines to reduce pain and inflammation are often the most helpful.Your caregiver may prescribe muscle relaxant drugs.These medicines help dull your pain so you can more quickly return to your normal activities and healthy exercise.  Put ice on the injured area.  Put ice in a plastic bag.  Place a towel between your skin and the bag.  Leave the ice on for 15-20 minutes, 3-4 times a day for the first 2 to 3 days. After that, ice and heat may be alternated to reduce pain and spasms.  Ask your caregiver about trying back exercises and gentle massage. This may be of some benefit.  Avoid feeling anxious or stressed.Stress increases muscle tension and can worsen back pain.It is important to recognize when you are anxious or stressed and learn ways to manage it.Exercise is a great option. SEEK MEDICAL CARE IF:  You have pain that is not relieved with rest or medicine.  You have pain that does not improve in 1 week.  You  have new symptoms.  You are generally not feeling well. SEEK IMMEDIATE MEDICAL CARE IF:   You have pain that radiates from your back into your legs.  You develop new bowel or bladder control problems.  You have unusual weakness or numbness in your arms or legs.  You develop nausea or vomiting.  You develop abdominal pain.  You feel faint. Document Released: 01/06/2005 Document Revised: 07/08/2011 Document Reviewed: 05/27/2010 [facility] Patient Information 2014 [place], [place].

## 2012-07-20 NOTE — Assessment & Plan Note (Signed)
Tramadol has been helpful, may continue bid dosing with a third dose given as needed. Consider Salon Pas patches as well and attempt modest weight loss

## 2012-07-20 NOTE — Progress Notes (Signed)
Patient ID: [name], female   DOB: 1940/08/13, 72 y.o.   MRN: [id number] PORFIRIA ALMER [id number] 25-Jul-1940 07/20/2012      Progress Note-Follow Up  Subjective  Chief Complaint  Chief Complaint  Patient presents with  . Follow-up    3 month    HPI  Patient is a 72 year old Caucasian female in today for followup. Generally doing well but does have a few concerns. Has persistent back pain but has not returned helpful. Pain is worsening and she's had no recent falls or injury. Notes the pain is most bothersome when she's been standing for prolonged period of time. Chest was struggling with stiffness and pain in her hands most notable in the morning and after prolonged rest. No other acute complaints. No chest pain or palpitation, recent illness, shortness of breath, GI or GU concerns are noted today.  Past Medical History  Diagnosis Date  . Asthma     PFT 02/06/09 FEV1 1.41 (65%), FVC 1.92 (64), FEV1% 74, TLC 3.47 (71%), DLCO 48%, +BD  . ACE-inhibitor cough   . DVT (deep venous thrombosis) 1987  . GERD (gastroesophageal reflux disease)   . Arthritis   . Atypical chest pain     s/p cath, Normal coronaries, Non ST elevation myocardial infarction, Rt groin pseudoaneurysm  . Hyperlipidemia   . Hypertension   . Depression   . Gout   . Abnormal glucose tolerance test     Past Surgical History  Procedure Laterality Date  . Appendectomy  1951  . Tubal ligation  1968  . Tonsillectomy  1950    Family History  Problem Relation Age of Onset  . Coronary artery disease Sister   . Asthma Sister   . Coronary artery disease Brother   . Coronary artery disease Other   . Coronary artery disease Brother   . Coronary artery disease Sister   . Coronary artery disease Sister   . Coronary artery disease Brother     History   Social History  . Marital Status: Widowed    Spouse Name: N/A    Number of Children: 1  . Years of Education: N/A   Occupational History  . COLLECTIONS      [employer]   Social History Main Topics  . Smoking status: Never Smoker   . Smokeless tobacco: Never Used  . Alcohol Use: No  . Drug Use: No  . Sexually Active: Not on file   Other Topics Concern  . Not on file   Social History Narrative  . No narrative on file    Current Outpatient Prescriptions on File Prior to Visit  Medication Sig Dispense Refill  . albuterol (ACCUNEB) 0.63 MG/3ML nebulizer solution Take 3 mLs (0.63 mg total) by nebulization every 6 (six) hours as needed.  75 mL  11  . albuterol (PROAIR HFA) 108 (90 BASE) MCG/ACT inhaler Inhale 2 puffs into the lungs every 4 (four) hours as needed.  3 Inhaler  3  . alendronate (FOSAMAX) 70 MG tablet Take 1 tablet (70 mg total) by mouth every 7 (seven) days. Take with a full glass of water on an empty stomach.  12 tablet  3  . allopurinol (ZYLOPRIM) 100 MG tablet Take 2 tablets (200 mg total) by mouth daily.  180 tablet  3  . baclofen (LIORESAL) 10 MG tablet Take 1 tablet (10 mg total) by mouth 3 (three) times daily as needed.  40 each  1  . budesonide-formoterol (SYMBICORT) 160-4.5 MCG/ACT inhaler Inhale 2  puffs into the lungs 2 (two) times daily.  3 Inhaler  3  . cetirizine (ZYRTEC) 10 MG tablet Take 10 mg by mouth daily.        . fluticasone (FLONASE) 50 MCG/ACT nasal spray USE TWO SPRAYS IN EACH NOSTRIL EVERY DAY  16 g  0  . furosemide (LASIX) 40 MG tablet Take 0.5 tablets (20 mg total) by mouth every other day.  90 tablet  3  . glucose blood (ONE TOUCH TEST STRIPS) test strip Use as instructed to check blood sugar once weekly. DX 790.29       . HYDROcodone-acetaminophen (NORCO/VICODIN) 5-325 MG per tablet Take 2 tablets by mouth every 6 (six) hours as needed for pain.  30 tablet  0  . losartan (COZAAR) 50 MG tablet Take 1 tablet (50 mg total) by mouth daily.  90 tablet  3  . methylPREDNIsolone (MEDROL DOSPACK) 4 MG tablet follow package directions  21 tablet  0  . montelukast (SINGULAIR) 10 MG tablet Take 1 tablet (10 mg  total) by mouth at bedtime.  30 tablet  3  . pantoprazole (PROTONIX) 40 MG tablet Take 1 tablet (40 mg total) by mouth daily.  90 tablet  3  . potassium chloride SA (KLOR-CON M20) 20 MEQ tablet Take 1 tablet (20 mEq total) by mouth daily.  90 tablet  3  . pravastatin (PRAVACHOL) 20 MG tablet Take 1 tablet (20 mg total) by mouth daily.  90 tablet  1  . sertraline (ZOLOFT) 50 MG tablet Take 1 tablet (50 mg total) by mouth daily.  90 tablet  3  . tiotropium (SPIRIVA) 18 MCG inhalation capsule Place 1 capsule (18 mcg total) into inhaler and inhale daily.  90 capsule  3   No current facility-administered medications on file prior to visit.    Allergies  Allergen Reactions  . Ace Inhibitors   . Indomethacin   . Montelukast Sodium     REACTION: Heart palpitations, chest pain  . Oseltamivir Phosphate     REACTION: nausea, vomiting, diarrhea, dizziness  . Sulfonamide Derivatives     Review of Systems  Review of Systems  Constitutional: Negative for fever and malaise/fatigue.  HENT: Negative for congestion.   Eyes: Negative for discharge.  Respiratory: Negative for shortness of breath.   Cardiovascular: Negative for chest pain, palpitations and leg swelling.  Gastrointestinal: Negative for nausea, abdominal pain and diarrhea.  Genitourinary: Negative for dysuria.  Musculoskeletal: Positive for myalgias and back pain. Negative for falls.  Skin: Negative for rash.  Neurological: Negative for loss of consciousness and headaches.  Endo/Heme/Allergies: Negative for polydipsia.  Psychiatric/Behavioral: Negative for depression and suicidal ideas. The patient is not nervous/anxious and does not have insomnia.     Objective  BP 118/66  Pulse 102  Temp(Src) 98.2 F (36.8 C) (Oral)  Ht 5\' 5"  (1.651 m)  Wt 230 lb 1.9 oz (104.382 kg)  BMI 38.29 kg/m2  SpO2 88%  Physical Exam  Physical Exam  Constitutional: She is oriented to person, place, and time and well-developed, well-nourished, and  in no distress. No distress.  HENT:  Head: Normocephalic and atraumatic.  Eyes: Conjunctivae are normal.  Neck: Neck supple. No thyromegaly present.  Cardiovascular: Normal rate, regular rhythm and normal heart sounds.   No murmur heard. Pulmonary/Chest: Effort normal and breath sounds normal. She has no wheezes.  Abdominal: She exhibits no distension and no mass.  Musculoskeletal: She exhibits no edema.  Lymphadenopathy:    She has no cervical adenopathy.  Neurological: She is alert and oriented to person, place, and time.  Skin: Skin is warm and dry. No rash noted. She is not diaphoretic.  Psychiatric: Memory, affect and judgment normal.    Lab Results  Component Value Date   TSH 1.541 04/23/2009   Lab Results  Component Value Date   WBC 8.9 11/20/2011   HGB 12.6 11/20/2011   HCT 38.7 11/20/2011   MCV 93.5 11/20/2011   PLT 182 11/20/2011   Lab Results  Component Value Date   CREATININE 1.10 11/20/2011   BUN 22 11/20/2011   NA 141 11/20/2011   K 4.6 11/20/2011   CL 103 11/20/2011   CO2 25 11/20/2011   Lab Results  Component Value Date   ALT 10 12/08/2011   AST 15 12/08/2011   ALKPHOS 54 12/08/2011   BILITOT 0.5 12/08/2011   Lab Results  Component Value Date   CHOL 153 12/08/2011   Lab Results  Component Value Date   HDL 51 12/08/2011   Lab Results  Component Value Date   LDLCALC 77 12/08/2011   Lab Results  Component Value Date   TRIG 127 12/08/2011   Lab Results  Component Value Date   CHOLHDL 3.0 12/08/2011     Assessment & Plan  HYPERTENSION Well controlled, no changes today  Back pain Tramadol has been helpful, may continue bid dosing with a third dose given as needed. Consider Salon Pas patches as well and attempt modest weight loss  DIABETES MELLITUS, TYPE II, BORDERLINE Avoid simple carbs, monitor hgba1c  GERD Worsens with weight gain but generally well controlled on current meds  HYPERLIPIDEMIA Avoid trans fats, well controlled  on Pravastatin

## 2012-07-20 NOTE — Assessment & Plan Note (Signed)
Worsens with weight gain but generally well controlled on current meds

## 2012-07-20 NOTE — Assessment & Plan Note (Signed)
Avoid simple carbs, monitor hgba1c

## 2012-10-20 ENCOUNTER — Ambulatory Visit (INDEPENDENT_AMBULATORY_CARE_PROVIDER_SITE_OTHER): Payer: Medicare Other | Admitting: Family Medicine

## 2012-10-20 ENCOUNTER — Encounter: Payer: Self-pay | Admitting: Family Medicine

## 2012-10-20 VITALS — BP 110/80 | HR 88 | Temp 98.3°F | Ht 65.0 in | Wt 230.0 lb

## 2012-10-20 DIAGNOSIS — Z5189 Encounter for other specified aftercare: Secondary | ICD-10-CM

## 2012-10-20 DIAGNOSIS — E785 Hyperlipidemia, unspecified: Secondary | ICD-10-CM

## 2012-10-20 DIAGNOSIS — R7309 Other abnormal glucose: Secondary | ICD-10-CM

## 2012-10-20 DIAGNOSIS — I10 Essential (primary) hypertension: Secondary | ICD-10-CM

## 2012-10-20 DIAGNOSIS — L578 Other skin changes due to chronic exposure to nonionizing radiation: Secondary | ICD-10-CM

## 2012-10-20 DIAGNOSIS — T7840XD Allergy, unspecified, subsequent encounter: Secondary | ICD-10-CM

## 2012-10-20 DIAGNOSIS — R350 Frequency of micturition: Secondary | ICD-10-CM

## 2012-10-20 DIAGNOSIS — Z23 Encounter for immunization: Secondary | ICD-10-CM

## 2012-10-20 LAB — RENAL FUNCTION PANEL
CO2: 28 mEq/L (ref 19–32)
Chloride: 104 mEq/L (ref 96–112)
Sodium: 140 mEq/L (ref 135–145)

## 2012-10-20 MED ORDER — MONTELUKAST SODIUM 10 MG PO TABS: 10.0000 mg | ORAL_TABLET | Freq: Every day | ORAL | Status: DC

## 2012-10-20 NOTE — Patient Instructions (Addendum)
Kegel exercises, sets of 10 twice a day  Urinary Incontinence Your doctor wants you to have this information about urinary incontinence. This is the inability to keep urine in your body until you decide to release it. CAUSES  Prostate gland enlargement is a common cause of urinary incontinence. But there are many different causes for losing urinary control. They include:  Medicines.  Infections.  Prostate problems.  Surgery.  Neurological diseases.  Emotional factors. DIAGNOSIS  Evaluating the cause of incontinence is important in choosing the best treatment. This may require:  An ultrasound exam.  Kidney and bladder X-rays.  Cystoscopy. This is an exam of the bladder using a narrow scope. TREATMENT  For incontinent patients, normal daily hygiene and using changing pads or adult diapers regularly will prevent offensive odors and skin damage from the moisture. Changing your medicines may help control incontinence. Your caregiver may prescribe some medicines to help you regain control. Avoid caffeine. It can over-stimulate the bladder. Use the bathroom regularly. Try about every 2 to 3 hours even if you do not feel the need. Take time to empty your bladder completely. After urinating, wait a minute. Then try to urinate again. External devices used to catch urine or an indwelling urine catheter (Foley catheter) may be needed as well. Some prostate gland problems require surgery to correct. Call your caregiver for more information. Document Released: 02/14/2004 Document Revised: 03/31/2011 Document Reviewed: 02/09/2008 [facility] Patient Information 2014 [place], [place].

## 2012-10-21 LAB — URINE CULTURE: Organism ID, Bacteria: NO GROWTH

## 2012-10-21 LAB — URINALYSIS
Bilirubin Urine: NEGATIVE
Ketones, ur: NEGATIVE mg/dL
Specific Gravity, Urine: 1.015 (ref 1.005–1.030)
Urobilinogen, UA: 0.2 mg/dL (ref 0.0–1.0)
pH: 5 (ref 5.0–8.0)

## 2012-10-22 NOTE — Progress Notes (Signed)
Quick Note:  Patient Informed and voiced understanding ______ 

## 2012-10-24 ENCOUNTER — Encounter: Payer: Self-pay | Admitting: Family Medicine

## 2012-10-24 DIAGNOSIS — N390 Urinary tract infection, site not specified: Secondary | ICD-10-CM | POA: Insufficient documentation

## 2012-10-24 DIAGNOSIS — L578 Other skin changes due to chronic exposure to nonionizing radiation: Secondary | ICD-10-CM | POA: Insufficient documentation

## 2012-10-24 NOTE — Assessment & Plan Note (Signed)
Glucose 103, minimize simple carbs

## 2012-10-24 NOTE — Assessment & Plan Note (Signed)
Has an SK on her back she is worried about and is offered reassurance but is referred to derm for skin exam due to sun damaged skin.

## 2012-10-24 NOTE — Progress Notes (Signed)
Patient ID: [name], female   DOB: 1940-06-23, 72 y.o.   MRN: [id number] BENIGNA CENTRELLA [id number] [birth year] 10/24/2012      Progress Note-Follow Up  Subjective  Chief Complaint  Chief Complaint  Patient presents with  . Follow-up    3 month  . Injections    flu    HPI  Patient is a 72 year old female who is in today for followup. 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 IV itchy. Otherwise she reports she has been feeling well. She doesn't note some low-grade cough sometimes productive of clear phlegm. Has some trouble breathing first thing in the morning but does well throughout the rest of the day. Has been struggling with some urinary frequency and urgency but no dysuria. Denies fevers, chills, chest pain, palpitations, shortness of breath, GI complaints  Past Medical History  Diagnosis Date  . Asthma     PFT 02/06/09 FEV1 1.41 (65%), FVC 1.92 (64), FEV1% 74, TLC 3.47 (71%), DLCO 48%, +BD  . ACE-inhibitor cough   . DVT (deep venous thrombosis) 1987  . GERD (gastroesophageal reflux disease)   . Arthritis   . Atypical chest pain     s/p cath, Normal coronaries, Non ST elevation myocardial infarction, Rt groin pseudoaneurysm  . Hyperlipidemia   . Hypertension   . Depression   . Gout   . Abnormal glucose tolerance test   . Sun-damaged skin 10/24/2012  . Urinary frequency 10/24/2012    Past Surgical History  Procedure Laterality Date  . Appendectomy  1951  . Tubal ligation  1968  . Tonsillectomy  1950    Family History  Problem Relation Age of Onset  . Coronary artery disease Sister   . Asthma Sister   . Coronary artery disease Brother   . Coronary artery disease Other   . Coronary artery disease Brother   . Coronary artery disease Sister   . Coronary artery disease Sister   . Coronary artery disease Brother     History   Social History  . Marital Status: Widowed    Spouse Name: N/A    Number of Children: 1  .  Years of Education: N/A   Occupational History  . COLLECTIONS     [employer]   Social History Main Topics  . Smoking status: Never Smoker   . Smokeless tobacco: Never Used  . Alcohol Use: No  . Drug Use: No  . Sexual Activity: Not on file   Other Topics Concern  . Not on file   Social History Narrative  . No narrative on file    Current Outpatient Prescriptions on File Prior to Visit  Medication Sig Dispense Refill  . albuterol (ACCUNEB) 0.63 MG/3ML nebulizer solution Take 3 mLs (0.63 mg total) by nebulization every 6 (six) hours as needed.  75 mL  11  . albuterol (PROAIR HFA) 108 (90 BASE) MCG/ACT inhaler Inhale 2 puffs into the lungs every 4 (four) hours as needed.  3 Inhaler  3  . alendronate (FOSAMAX) 70 MG tablet Take 1 tablet (70 mg total) by mouth every 7 (seven) days. Take with a full glass of water on an empty stomach.  12 tablet  3  . allopurinol (ZYLOPRIM) 100 MG tablet Take 2 tablets (200 mg total) by mouth daily.  180 tablet  3  . baclofen (LIORESAL) 10 MG tablet Take 1 tablet (10 mg total) by mouth 3 (three) times daily as needed.  40 each  1  .  budesonide-formoterol (SYMBICORT) 160-4.5 MCG/ACT inhaler Inhale 2 puffs into the lungs 2 (two) times daily.  3 Inhaler  3  . cetirizine (ZYRTEC) 10 MG tablet Take 10 mg by mouth daily.        . fluticasone (FLONASE) 50 MCG/ACT nasal spray USE TWO SPRAYS IN EACH NOSTRIL EVERY DAY  16 g  0  . furosemide (LASIX) 40 MG tablet Take 0.5 tablets (20 mg total) by mouth every other day.  90 tablet  3  . glucose blood (ONE TOUCH TEST STRIPS) test strip Use as instructed to check blood sugar once weekly. DX 790.29       . HYDROcodone-acetaminophen (NORCO/VICODIN) 5-325 MG per tablet Take 2 tablets by mouth every 6 (six) hours as needed for pain.  30 tablet  0  . losartan (COZAAR) 50 MG tablet Take 1 tablet (50 mg total) by mouth daily.  90 tablet  3  . methylPREDNIsolone (MEDROL DOSPACK) 4 MG tablet follow package directions  21 tablet  0   . pantoprazole (PROTONIX) 40 MG tablet Take 1 tablet (40 mg total) by mouth daily.  90 tablet  3  . potassium chloride SA (KLOR-CON M20) 20 MEQ tablet Take 1 tablet (20 mEq total) by mouth daily.  90 tablet  3  . pravastatin (PRAVACHOL) 20 MG tablet Take 1 tablet (20 mg total) by mouth daily.  90 tablet  1  . sertraline (ZOLOFT) 50 MG tablet Take 1 tablet (50 mg total) by mouth daily.  90 tablet  3  . tiotropium (SPIRIVA) 18 MCG inhalation capsule Place 1 capsule (18 mcg total) into inhaler and inhale daily.  90 capsule  3  . traMADol (ULTRAM) 50 MG tablet Take 1 tablet (50 mg total) by mouth every 8 (eight) hours as needed for pain.  90 tablet  2   No current facility-administered medications on file prior to visit.    Allergies  Allergen Reactions  . Ace Inhibitors   . Indomethacin   . Montelukast Sodium     REACTION: Heart palpitations, chest pain  . Oseltamivir Phosphate     REACTION: nausea, vomiting, diarrhea, dizziness  . Sulfonamide Derivatives     Review of Systems  Review of Systems  Constitutional: Negative for fever and malaise/fatigue.  HENT: Positive for congestion. Negative for sore throat.   Eyes: Negative for discharge.  Respiratory: Negative for shortness of breath.   Cardiovascular: Negative for chest pain, palpitations and leg swelling.  Gastrointestinal: Negative for nausea, abdominal pain and diarrhea.  Genitourinary: Positive for urgency and frequency. Negative for dysuria.  Musculoskeletal: Negative for falls.  Skin: Negative for rash.  Neurological: Negative for loss of consciousness and headaches.  Endo/Heme/Allergies: Negative for polydipsia.  Psychiatric/Behavioral: Negative for depression and suicidal ideas. The patient is not nervous/anxious and does not have insomnia.     Objective  BP 110/80  Pulse 88  Temp(Src) 98.3 F (36.8 C) (Oral)  Ht 5\' 5"  (1.651 m)  Wt 230 lb (104.327 kg)  BMI 38.27 kg/m2  SpO2 92%  Physical Exam  Physical  Exam  Constitutional: She is oriented to person, place, and time and well-developed, well-nourished, and in no distress. No distress.  HENT:  Head: Normocephalic and atraumatic.  Eyes: Conjunctivae are normal.  Neck: Neck supple. No thyromegaly present.  Cardiovascular: Normal rate, regular rhythm and normal heart sounds.   No murmur heard. Pulmonary/Chest: Effort normal and breath sounds normal. She has no wheezes.  Abdominal: She exhibits no distension and no mass.  Musculoskeletal:  She exhibits no edema.  Lymphadenopathy:    She has no cervical adenopathy.  Neurological: She is alert and oriented to person, place, and time.  Skin: Skin is warm and dry. No rash noted. She is not diaphoretic.  Psychiatric: Memory, affect and judgment normal.    Lab Results  Component Value Date   TSH 2.546 07/20/2012   Lab Results  Component Value Date   WBC 8.9 11/20/2011   HGB 12.6 11/20/2011   HCT 38.7 11/20/2011   MCV 93.5 11/20/2011   PLT 182 11/20/2011   Lab Results  Component Value Date   CREATININE 1.14* 10/20/2012   BUN 21 10/20/2012   NA 140 10/20/2012   K 5.3 10/20/2012   CL 104 10/20/2012   CO2 28 10/20/2012   Lab Results  Component Value Date   ALT 14 07/20/2012   AST 16 07/20/2012   ALKPHOS 50 07/20/2012   BILITOT 0.6 07/20/2012   Lab Results  Component Value Date   CHOL 151 07/20/2012   Lab Results  Component Value Date   HDL 51 07/20/2012   Lab Results  Component Value Date   LDLCALC 79 07/20/2012   Lab Results  Component Value Date   TRIG 104 07/20/2012   Lab Results  Component Value Date   CHOLHDL 3.0 07/20/2012     Assessment & Plan  HYPERTENSION Well controlled no changes to meds.   DIABETES MELLITUS, TYPE II, BORDERLINE Glucose 103, minimize simple carbs  Sun-damaged skin Has an SK on her back she is worried about and is offered reassurance but is referred to derm for skin exam due to sun damaged skin.   HYPERLIPIDEMIA Tolerating Pravastatin, well  controlled  Urinary frequency UA c and s normal today, encouraged kegel exercises

## 2012-10-24 NOTE — Assessment & Plan Note (Signed)
Well controlled no changes to meds 

## 2012-10-24 NOTE — Assessment & Plan Note (Signed)
Tolerating Pravastatin, well controlled

## 2012-10-24 NOTE — Assessment & Plan Note (Signed)
UA c and s normal today, encouraged kegel exercises

## 2012-11-01 ENCOUNTER — Other Ambulatory Visit: Payer: Self-pay

## 2012-11-01 DIAGNOSIS — Z1231 Encounter for screening mammogram for malignant neoplasm of breast: Secondary | ICD-10-CM

## 2012-11-23 ENCOUNTER — Other Ambulatory Visit: Payer: Self-pay

## 2012-11-23 DIAGNOSIS — M549 Dorsalgia, unspecified: Secondary | ICD-10-CM

## 2012-11-23 MED ORDER — TRAMADOL HCL 50 MG PO TABS: 50.0000 mg | ORAL_TABLET | Freq: Three times a day (TID) | ORAL | Status: DC | PRN

## 2012-11-23 NOTE — Telephone Encounter (Signed)
Please advise refill? Last RX was done on 07-20-12 quantity 90 with 2 refills.  If ok to refill send to [phone]

## 2012-11-25 ENCOUNTER — Ambulatory Visit
Admission: RE | Admit: 2012-11-25 | Discharge: 2012-11-25 | Disposition: A | Discharge status: Home / Self Care | Payer: Medicare Other | Source: Ambulatory Visit

## 2012-11-25 DIAGNOSIS — Z1231 Encounter for screening mammogram for malignant neoplasm of breast: Secondary | ICD-10-CM

## 2012-12-02 ENCOUNTER — Other Ambulatory Visit: Payer: Self-pay | Admitting: Family Medicine

## 2012-12-02 ENCOUNTER — Telehealth: Payer: Self-pay | Admitting: Family Medicine

## 2012-12-02 DIAGNOSIS — R928 Other abnormal and inconclusive findings on diagnostic imaging of breast: Secondary | ICD-10-CM

## 2012-12-02 DIAGNOSIS — N6320 Unspecified lump in the left breast, unspecified quadrant: Secondary | ICD-10-CM

## 2012-12-02 NOTE — Telephone Encounter (Signed)
The patient is scheduled for breast ultrasound on 12-21-2012 @2  with the [facility] in [place].  They also need an order for a diagnostic mammogram to be done at the same time

## 2012-12-21 ENCOUNTER — Ambulatory Visit
Admission: RE | Admit: 2012-12-21 | Discharge: 2012-12-21 | Disposition: A | Discharge status: Home / Self Care | Payer: Medicare Other | Source: Ambulatory Visit | Attending: Family Medicine | Admitting: Family Medicine

## 2012-12-21 DIAGNOSIS — R928 Other abnormal and inconclusive findings on diagnostic imaging of breast: Secondary | ICD-10-CM

## 2012-12-24 ENCOUNTER — Other Ambulatory Visit: Payer: Self-pay | Admitting: *Deleted

## 2012-12-24 MED ORDER — PRAVASTATIN SODIUM 20 MG PO TABS: 20.0000 mg | ORAL_TABLET | Freq: Every day | ORAL | Status: DC

## 2012-12-24 MED ORDER — PANTOPRAZOLE SODIUM 40 MG PO TBEC: 40.0000 mg | DELAYED_RELEASE_TABLET | Freq: Every day | ORAL | Status: DC

## 2012-12-24 NOTE — Telephone Encounter (Signed)
Rx request to pharmacy/SLS  

## 2013-01-03 ENCOUNTER — Other Ambulatory Visit: Payer: Self-pay | Admitting: Internal Medicine

## 2013-01-03 NOTE — Telephone Encounter (Signed)
Albuterol refilled per protocol. JG//CMA

## 2013-02-14 ENCOUNTER — Telehealth: Payer: Self-pay | Admitting: Family Medicine

## 2013-02-14 DIAGNOSIS — M549 Dorsalgia, unspecified: Secondary | ICD-10-CM

## 2013-02-14 NOTE — Telephone Encounter (Signed)
Patient has changed insurance and needs to change where she gets her meds.  Wants written rx for montelukast 10 mg tramadol 50 mg and allopurinol 100 mg    Wants to pick up the rx on Tuesday 02-15-2013

## 2013-02-15 NOTE — Telephone Encounter (Signed)
Ok to write 30 day supply of all 3 meds with 5 rf on all but Tramadol, I do not think I can do rfs on that any more. If I can do 2 rf

## 2013-02-15 NOTE — Telephone Encounter (Signed)
Please advise Tramadol refill?  Last RX was doen on 12-03-12 quantity 90 with 0 refills.  Pt will pick up these RX's

## 2013-02-16 MED ORDER — MONTELUKAST SODIUM 10 MG PO TABS: 10.0000 mg | ORAL_TABLET | Freq: Every day | ORAL | Status: DC

## 2013-02-16 MED ORDER — ALLOPURINOL 100 MG PO TABS: 200.0000 mg | ORAL_TABLET | Freq: Every day | ORAL | Status: DC

## 2013-02-16 MED ORDER — TRAMADOL HCL 50 MG PO TABS: 50.0000 mg | ORAL_TABLET | Freq: Three times a day (TID) | ORAL | Status: DC | PRN

## 2013-02-16 NOTE — Telephone Encounter (Signed)
Requested Rx[s] printed and placed in provider bin on desk for signature upon returning to office on Thurs, 01.29.15/SLS

## 2013-02-17 NOTE — Telephone Encounter (Signed)
RX faxed

## 2013-02-21 ENCOUNTER — Telehealth: Payer: Self-pay | Admitting: Family Medicine

## 2013-02-21 ENCOUNTER — Ambulatory Visit (INDEPENDENT_AMBULATORY_CARE_PROVIDER_SITE_OTHER): Payer: Medicare HMO | Admitting: Family Medicine

## 2013-02-21 ENCOUNTER — Encounter: Payer: Self-pay | Admitting: Family Medicine

## 2013-02-21 VITALS — BP 122/80 | HR 95 | Temp 98.2°F | Ht 65.0 in | Wt 227.0 lb

## 2013-02-21 DIAGNOSIS — I10 Essential (primary) hypertension: Secondary | ICD-10-CM

## 2013-02-21 DIAGNOSIS — Z23 Encounter for immunization: Secondary | ICD-10-CM

## 2013-02-21 DIAGNOSIS — N289 Disorder of kidney and ureter, unspecified: Secondary | ICD-10-CM

## 2013-02-21 DIAGNOSIS — L578 Other skin changes due to chronic exposure to nonionizing radiation: Secondary | ICD-10-CM

## 2013-02-21 DIAGNOSIS — R7309 Other abnormal glucose: Secondary | ICD-10-CM

## 2013-02-21 DIAGNOSIS — R0902 Hypoxemia: Secondary | ICD-10-CM

## 2013-02-21 DIAGNOSIS — J329 Chronic sinusitis, unspecified: Secondary | ICD-10-CM

## 2013-02-21 DIAGNOSIS — R739 Hyperglycemia, unspecified: Secondary | ICD-10-CM

## 2013-02-21 DIAGNOSIS — E785 Hyperlipidemia, unspecified: Secondary | ICD-10-CM

## 2013-02-21 DIAGNOSIS — R000 Tachycardia, unspecified: Secondary | ICD-10-CM

## 2013-02-21 DIAGNOSIS — J45909 Unspecified asthma, uncomplicated: Secondary | ICD-10-CM

## 2013-02-21 MED ORDER — TIOTROPIUM BROMIDE MONOHYDRATE 18 MCG IN CAPS: 18.0000 ug | ORAL_CAPSULE | Freq: Every day | RESPIRATORY_TRACT | Status: DC

## 2013-02-21 MED ORDER — ALBUTEROL SULFATE HFA 108 (90 BASE) MCG/ACT IN AERS: 2.0000 | INHALATION_SPRAY | RESPIRATORY_TRACT | Status: DC | PRN

## 2013-02-21 MED ORDER — DOXYCYCLINE HYCLATE 100 MG PO TABS: 100.0000 mg | ORAL_TABLET | Freq: Two times a day (BID) | ORAL | Status: DC

## 2013-02-21 NOTE — Telephone Encounter (Signed)
Lab order week of 05/20/13  Labs prior to next visit, lipid, renal, hgba1c, hepatic, cbc, tsh   Patient will be going to [facility] lab

## 2013-02-21 NOTE — Progress Notes (Signed)
Pre visit review using our clinic review tool, if applicable. No additional management support is needed unless otherwise documented below in the visit note. 

## 2013-02-21 NOTE — Patient Instructions (Addendum)
Add Probiotic such as Digestive advantage, Align, generic   Hypertension As your heart beats, it forces blood through your arteries. This force is your blood pressure. If the pressure is too high, it is called hypertension (HTN) or high blood pressure. HTN is dangerous because you may have it and not know it. High blood pressure may mean that your heart has to work harder to pump blood. Your arteries may be narrow or stiff. The extra work puts you at risk for heart disease, stroke, and other problems.  Blood pressure consists of two numbers, a higher number over a lower, 110/72, for example. It is stated as "110 over 72." The ideal is below 120 for the top number (systolic) and under 80 for the bottom (diastolic). Write down your blood pressure today. You should pay close attention to your blood pressure if you have certain conditions such as:  Heart failure.  Prior heart attack.  Diabetes  Chronic kidney disease.  Prior stroke.  Multiple risk factors for heart disease. To see if you have HTN, your blood pressure should be measured while you are seated with your arm held at the level of the heart. It should be measured at least twice. A one-time elevated blood pressure reading (especially in the Emergency Department) does not mean that you need treatment. There may be conditions in which the blood pressure is different between your right and left arms. It is important to see your caregiver soon for a recheck. Most people have essential hypertension which means that there is not a specific cause. This type of high blood pressure may be lowered by changing lifestyle factors such as:  Stress.  Smoking.  Lack of exercise.  Excessive weight.  Drug/tobacco/alcohol use.  Eating less salt. Most people do not have symptoms from high blood pressure until it has caused damage to the body. Effective treatment can often prevent, delay or reduce that damage. TREATMENT  When a cause has been  identified, treatment for high blood pressure is directed at the cause. There are a large number of medications to treat HTN. These fall into several categories, and your caregiver will help you select the medicines that are best for you. Medications may have side effects. You should review side effects with your caregiver. If your blood pressure stays high after you have made lifestyle changes or started on medicines,   Your medication(s) may need to be changed.  Other problems may need to be addressed.  Be certain you understand your prescriptions, and know how and when to take your medicine.  Be sure to follow up with your caregiver within the time frame advised (usually within two weeks) to have your blood pressure rechecked and to review your medications.  If you are taking more than one medicine to lower your blood pressure, make sure you know how and at what times they should be taken. Taking two medicines at the same time can result in blood pressure that is too low. SEEK IMMEDIATE MEDICAL CARE IF:  You develop a severe headache, blurred or changing vision, or confusion.  You have unusual weakness or numbness, or a faint feeling.  You have severe chest or abdominal pain, vomiting, or breathing problems. MAKE SURE YOU:   Understand these instructions.  Will watch your condition.  Will get help right away if you are not doing well or get worse. Document Released: 01/06/2005 Document Revised: 03/31/2011 Document Reviewed: 08/27/2007 [facility] Patient Information 2014 [place].

## 2013-02-22 ENCOUNTER — Telehealth: Payer: Self-pay | Admitting: Family Medicine

## 2013-02-22 NOTE — Telephone Encounter (Signed)
Lab order placed.

## 2013-02-22 NOTE — Telephone Encounter (Signed)
Relevant patient education assigned to patient using Emmi. ° °

## 2013-02-23 ENCOUNTER — Encounter: Payer: Self-pay | Admitting: Family Medicine

## 2013-02-23 DIAGNOSIS — J329 Chronic sinusitis, unspecified: Secondary | ICD-10-CM | POA: Insufficient documentation

## 2013-02-23 DIAGNOSIS — R000 Tachycardia, unspecified: Secondary | ICD-10-CM | POA: Insufficient documentation

## 2013-02-23 NOTE — Assessment & Plan Note (Signed)
Well controlled no changes 

## 2013-02-23 NOTE — Assessment & Plan Note (Signed)
Started on antibiotic, probiotics and mucinex

## 2013-02-23 NOTE — Assessment & Plan Note (Signed)
With worsening dyspnea and hypoxia, notes some associated palpitations will refer to both pulmonology and cardiology due to her symptoms and her risk factors for further consideration

## 2013-02-23 NOTE — Assessment & Plan Note (Signed)
Doing well encouraged to avoid simple carbs

## 2013-02-23 NOTE — Progress Notes (Signed)
Patient ID: [name], female   DOB: Jan 25, 1940, 73 y.o.   MRN: [id number] [name] [id number] 19-Aug-1940 02/23/2013      Progress Note-Follow Up  Subjective  Chief Complaint  Chief Complaint  Patient presents with  . Follow-up    4 month    HPI  Patient is a 73 year old female who is in today for routine followup. Her biggest complaint is of worsening dyspnea, fatigue and palpitations with any exertion. She notes with limited distance she feels very fatigued and short of breath and has to rest. She never was a smoker but has had asthma for a number of years. She has been struggling with increased respiratory symptoms suggestive of infection as well but her dyspnea has been present longer than mass. She is presently complaining of chills and some wheezing. Cough productive of clear phlegm and some intermittent ear pain is noted. Does have some left-sided chest wall pain with coughing and deep breathing as well. Denies any diarrhea or vomiting. No edema, fevers or headaches.  Past Medical History  Diagnosis Date  . Asthma     PFT 02/06/09 FEV1 1.41 (65%), FVC 1.92 (64), FEV1% 74, TLC 3.47 (71%), DLCO 48%, +BD  . ACE-inhibitor cough   . DVT (deep venous thrombosis) 1987  . GERD (gastroesophageal reflux disease)   . Arthritis   . Atypical chest pain     s/p cath, Normal coronaries, Non ST elevation myocardial infarction, Rt groin pseudoaneurysm  . Hyperlipidemia   . Hypertension   . Depression   . Gout   . Abnormal glucose tolerance test   . Sun-damaged skin 10/24/2012  . Urinary frequency 10/24/2012    Past Surgical History  Procedure Laterality Date  . Appendectomy  1951  . Tubal ligation  1968  . Tonsillectomy  1950    Family History  Problem Relation Age of Onset  . Coronary artery disease Sister   . Asthma Sister   . Coronary artery disease Brother   . Coronary artery disease Other   . Coronary artery disease Brother   . Coronary artery disease Sister   .  Coronary artery disease Sister   . Coronary artery disease Brother     History   Social History  . Marital Status: Widowed    Spouse Name: N/A    Number of Children: 1  . Years of Education: N/A   Occupational History  . [employer]     [employer]   Social History Main Topics  . Smoking status: Never Smoker   . Smokeless tobacco: Never Used  . Alcohol Use: No  . Drug Use: No  . Sexual Activity: Not on file   Other Topics Concern  . Not on file   Social History Narrative  . No narrative on file    Current Outpatient Prescriptions on File Prior to Visit  Medication Sig Dispense Refill  . albuterol (ACCUNEB) 0.63 MG/3ML nebulizer solution USE ONE VIAL IN NEBULIZER EVERY 6 HOURS AS NEEDED  3 mL  2  . alendronate (FOSAMAX) 70 MG tablet Take 1 tablet (70 mg total) by mouth every 7 (seven) days. Take with a full glass of water on an empty stomach.  12 tablet  3  . allopurinol (ZYLOPRIM) 100 MG tablet Take 2 tablets (200 mg total) by mouth daily.  60 tablet  5  . baclofen (LIORESAL) 10 MG tablet Take 1 tablet (10 mg total) by mouth 3 (three) times daily as needed.  40 each  1  .  budesonide-formoterol (SYMBICORT) 160-4.5 MCG/ACT inhaler Inhale 2 puffs into the lungs 2 (two) times daily.  3 Inhaler  3  . cetirizine (ZYRTEC) 10 MG tablet Take 10 mg by mouth daily.        . fluticasone (FLONASE) 50 MCG/ACT nasal spray USE TWO SPRAYS IN EACH NOSTRIL EVERY DAY  16 g  0  . furosemide (LASIX) 40 MG tablet Take 0.5 tablets (20 mg total) by mouth every other day.  90 tablet  3  . glucose blood (ONE TOUCH TEST STRIPS) test strip Use as instructed to check blood sugar once weekly. DX 790.29       . HYDROcodone-acetaminophen (NORCO/VICODIN) 5-325 MG per tablet Take 2 tablets by mouth every 6 (six) hours as needed for pain.  30 tablet  0  . losartan (COZAAR) 50 MG tablet Take 1 tablet (50 mg total) by mouth daily.  90 tablet  3  . methylPREDNIsolone (MEDROL DOSPACK) 4 MG tablet follow package  directions  21 tablet  0  . montelukast (SINGULAIR) 10 MG tablet Take 1 tablet (10 mg total) by mouth at bedtime.  30 tablet  5  . pantoprazole (PROTONIX) 40 MG tablet Take 1 tablet (40 mg total) by mouth daily.  90 tablet  1  . potassium chloride SA (KLOR-CON M20) 20 MEQ tablet Take 1 tablet (20 mEq total) by mouth daily.  90 tablet  3  . pravastatin (PRAVACHOL) 20 MG tablet Take 1 tablet (20 mg total) by mouth daily.  90 tablet  1  . sertraline (ZOLOFT) 50 MG tablet Take 1 tablet (50 mg total) by mouth daily.  90 tablet  3  . traMADol (ULTRAM) 50 MG tablet Take 1 tablet (50 mg total) by mouth every 8 (eight) hours as needed.  90 tablet  2   No current facility-administered medications on file prior to visit.    Allergies  Allergen Reactions  . Ace Inhibitors   . Indomethacin   . Montelukast Sodium     REACTION: Heart palpitations, chest pain  . Oseltamivir Phosphate     REACTION: nausea, vomiting, diarrhea, dizziness  . Sulfonamide Derivatives     Review of Systems  Review of Systems  Constitutional: Positive for malaise/fatigue. Negative for fever.  HENT: Positive for congestion.   Eyes: Negative for discharge.  Respiratory: Positive for cough, sputum production and shortness of breath.   Cardiovascular: Positive for palpitations. Negative for chest pain and leg swelling.  Gastrointestinal: Negative for nausea, abdominal pain and diarrhea.  Genitourinary: Negative for dysuria.  Musculoskeletal: Negative for falls.  Skin: Negative for rash.  Neurological: Negative for loss of consciousness and headaches.  Endo/Heme/Allergies: Negative for polydipsia.  Psychiatric/Behavioral: Negative for depression and suicidal ideas. The patient is not nervous/anxious and does not have insomnia.     Objective  BP 122/80  Pulse 95  Temp(Src) 98.2 F (36.8 C) (Oral)  Ht 5\' 5"  (1.651 m)  Wt 227 lb 0.6 oz (102.985 kg)  BMI 37.78 kg/m2  SpO2 86%  Physical Exam  Physical Exam   Constitutional: She is oriented to person, place, and time and well-developed, well-nourished, and in no distress. No distress.  HENT:  Head: Normocephalic and atraumatic.  Eyes: Conjunctivae are normal.  Neck: Neck supple. No thyromegaly present.  Cardiovascular: Normal rate, regular rhythm and normal heart sounds.   No murmur heard. Pulmonary/Chest: Effort normal and breath sounds normal. She has no wheezes.  Decreased BS b/l bases  Abdominal: She exhibits no distension and no mass.  Musculoskeletal: She exhibits no edema.  Lymphadenopathy:    She has no cervical adenopathy.  Neurological: She is alert and oriented to person, place, and time.  Skin: Skin is warm and dry. No rash noted. She is not diaphoretic.  Psychiatric: Memory, affect and judgment normal.    Lab Results  Component Value Date   TSH 2.546 07/20/2012   Lab Results  Component Value Date   WBC 8.9 11/20/2011   HGB 12.6 11/20/2011   HCT 38.7 11/20/2011   MCV 93.5 11/20/2011   PLT 182 11/20/2011   Lab Results  Component Value Date   CREATININE 1.14* 10/20/2012   BUN 21 10/20/2012   NA 140 10/20/2012   K 5.3 10/20/2012   CL 104 10/20/2012   CO2 28 10/20/2012   Lab Results  Component Value Date   ALT 14 07/20/2012   AST 16 07/20/2012   ALKPHOS 50 07/20/2012   BILITOT 0.6 07/20/2012   Lab Results  Component Value Date   CHOL 151 07/20/2012   Lab Results  Component Value Date   HDL 51 07/20/2012   Lab Results  Component Value Date   LDLCALC 79 07/20/2012   Lab Results  Component Value Date   TRIG 104 07/20/2012   Lab Results  Component Value Date   CHOLHDL 3.0 07/20/2012     Assessment & Plan  HYPERTENSION Well controlled no changes  ASTHMA With worsening dyspnea and hypoxia, notes some associated palpitations will refer to both pulmonology and cardiology due to her symptoms and her risk factors for further consideration  HYPERLIPIDEMIA Well treated, tolerating Pravastatin, avoid trans fats, continue  same  DIABETES MELLITUS, TYPE II, BORDERLINE Doing well encouraged to avoid simple carbs  Sun-damaged skin Referred to dermatology for surveillance  Sinusitis Started on antibiotic, probiotics and mucinex

## 2013-02-23 NOTE — Assessment & Plan Note (Signed)
Referred to dermatology for surveillance.

## 2013-02-23 NOTE — Assessment & Plan Note (Signed)
Well treated, tolerating Pravastatin, avoid trans fats, continue same

## 2013-03-02 ENCOUNTER — Telehealth: Payer: Self-pay | Admitting: Family Medicine

## 2013-03-02 DIAGNOSIS — J329 Chronic sinusitis, unspecified: Secondary | ICD-10-CM

## 2013-03-02 NOTE — Telephone Encounter (Signed)
Refill- doxycycline

## 2013-03-07 ENCOUNTER — Ambulatory Visit (INDEPENDENT_AMBULATORY_CARE_PROVIDER_SITE_OTHER): Payer: Medicare PPO | Admitting: Cardiovascular Disease

## 2013-03-07 ENCOUNTER — Encounter: Payer: Self-pay | Admitting: Cardiovascular Disease

## 2013-03-07 VITALS — BP 134/84 | HR 106 | Ht 65.0 in | Wt 223.8 lb

## 2013-03-07 DIAGNOSIS — R0989 Other specified symptoms and signs involving the circulatory and respiratory systems: Secondary | ICD-10-CM

## 2013-03-07 DIAGNOSIS — R0609 Other forms of dyspnea: Secondary | ICD-10-CM

## 2013-03-07 DIAGNOSIS — R000 Tachycardia, unspecified: Secondary | ICD-10-CM

## 2013-03-07 DIAGNOSIS — R0600 Dyspnea, unspecified: Secondary | ICD-10-CM

## 2013-03-07 DIAGNOSIS — R0602 Shortness of breath: Secondary | ICD-10-CM | POA: Insufficient documentation

## 2013-03-07 NOTE — Patient Instructions (Signed)
Your physician recommends that you continue on your current medications as directed. Please refer to the Current Medication list given to you today.   Your physician recommends that you schedule a follow-up appointment in: AS NEEDED BASIS

## 2013-03-07 NOTE — Assessment & Plan Note (Signed)
She has a sinus tachycardia medication. She is on multiple medications that would cause her to have sinus  tachycardic. I suspect this is completely due to her asthma.  She had a heart catheterization in 2008 which revealed normal coronary arteries and normal left ventricular systolic function.

## 2013-03-07 NOTE — Progress Notes (Signed)
[name] Date of Birth  19-Feb-1940       [facility]    [employer] [address], [place], [place]  [address]   [place], [place]  [address] [address]   Fax  [phone]     Fax [phone]  Problem List: 1. Severe asthma 2. Shortness of breath with exertion 3. Normal cardiac catheterization 2008 4. Hypertension 5. Hyperlipidemia 6. The venous thrombosis 7. Depression A.Gout  History of Present Illness:  [name] is referred today for evaluation of her dypsnea.  She has chronic asthma ( FEV1 equals 1.41-65% predicted, FVC is 1.92-64% predicted) .  She does not have any chest pain.  She had a normal cardiac cath in 2008.  She does not get any regular exercise.  She has not gone back in a while.    She become short of breath with any of her normal activities - bring groceries in from the car, climbing stairs.    Current Outpatient Prescriptions on File Prior to Visit  Medication Sig Dispense Refill  . albuterol (ACCUNEB) 0.63 MG/3ML nebulizer solution USE ONE VIAL IN NEBULIZER EVERY 6 HOURS AS NEEDED  3 mL  2  . albuterol (PROAIR HFA) 108 (90 BASE) MCG/ACT inhaler Inhale 2 puffs into the lungs every 4 (four) hours as needed.  3 Inhaler  3  . alendronate (FOSAMAX) 70 MG tablet Take 1 tablet (70 mg total) by mouth every 7 (seven) days. Take with a full glass of water on an empty stomach.  12 tablet  3  . allopurinol (ZYLOPRIM) 100 MG tablet Take 2 tablets (200 mg total) by mouth daily.  60 tablet  5  . baclofen (LIORESAL) 10 MG tablet Take 1 tablet (10 mg total) by mouth 3 (three) times daily as needed.  40 each  1  . budesonide-formoterol (SYMBICORT) 160-4.5 MCG/ACT inhaler Inhale 2 puffs into the lungs 2 (two) times daily.  3 Inhaler  3  . fluticasone (FLONASE) 50 MCG/ACT nasal spray USE TWO SPRAYS IN EACH NOSTRIL EVERY DAY  16 g  0  . furosemide (LASIX) 40 MG tablet Take 0.5 tablets (20 mg total) by  mouth every other day.  90 tablet  3  . glucose blood (ONE TOUCH TEST STRIPS) test strip Use as instructed to check blood sugar once weekly. DX 790.29       . losartan (COZAAR) 50 MG tablet Take 1 tablet (50 mg total) by mouth daily.  90 tablet  3  . montelukast (SINGULAIR) 10 MG tablet Take 1 tablet (10 mg total) by mouth at bedtime.  30 tablet  5  . pantoprazole (PROTONIX) 40 MG tablet Take 1 tablet (40 mg total) by mouth daily.  90 tablet  1  . potassium chloride SA (KLOR-CON M20) 20 MEQ tablet Take 1 tablet (20 mEq total) by mouth daily.  90 tablet  3  . pravastatin (PRAVACHOL) 20 MG tablet Take 1 tablet (20 mg total) by mouth daily.  90 tablet  1  . sertraline (ZOLOFT) 50 MG tablet Take 1 tablet (50 mg total) by mouth daily.  90 tablet  3  . tiotropium (SPIRIVA) 18 MCG inhalation capsule Place 1 capsule (18 mcg total) into inhaler and inhale daily.  90 capsule  3  . traMADol (ULTRAM) 50 MG tablet Take 1 tablet (50 mg total) by mouth every 8 (eight) hours as needed.  90 tablet  2  No current facility-administered medications on file prior to visit.    Allergies  Allergen Reactions  . Ace Inhibitors   . Indomethacin   . Montelukast Sodium     REACTION: Heart palpitations, chest pain  . Oseltamivir Phosphate     REACTION: nausea, vomiting, diarrhea, dizziness  . Sulfonamide Derivatives     Past Medical History  Diagnosis Date  . Asthma     PFT 02/06/09 FEV1 1.41 (65%), FVC 1.92 (64), FEV1% 74, TLC 3.47 (71%), DLCO 48%, +BD  . ACE-inhibitor cough   . DVT (deep venous thrombosis) 1987  . GERD (gastroesophageal reflux disease)   . Arthritis   . Atypical chest pain     s/p cath, Normal coronaries, Non ST elevation myocardial infarction, Rt groin pseudoaneurysm  . Hyperlipidemia   . Hypertension   . Depression   . Gout   . Abnormal glucose tolerance test   . Sun-damaged skin 10/24/2012  . Urinary frequency 10/24/2012    Past Surgical History  Procedure Laterality Date  .  Appendectomy  1951  . Tubal ligation  1968  . Tonsillectomy  1950    History  Smoking status  . Never Smoker   Smokeless tobacco  . Never Used    History  Alcohol Use No    Family History  Problem Relation Age of Onset  . Coronary artery disease Sister   . Asthma Sister   . Coronary artery disease Brother   . Coronary artery disease Other   . Coronary artery disease Brother   . Coronary artery disease Sister   . Coronary artery disease Sister   . Coronary artery disease Brother     Reviw of Systems:  Reviewed in the HPI.  All other systems are negative.  Physical Exam: Blood pressure 134/84, pulse 106, height 5\' 5"  (1.651 m), weight 223 lb 12.8 oz (101.515 kg). Wt Readings from Last 3 Encounters:  03/07/13 223 lb 12.8 oz (101.515 kg)  02/21/13 227 lb 0.6 oz (102.985 kg)  10/20/12 230 lb (104.327 kg)     General: Well developed, well nourished, in no acute distress.  Head: Normocephalic, atraumatic, sclera non-icteric, mucus membranes are moist,   Neck: Supple. Carotids are 2 + without bruits. No JVD   Lungs: Clear   Heart: RR, distant HS  Abdomen: Soft, non-tender, non-distended with normal bowel sounds.  Msk:  Strength and tone are normal   Extremities: No clubbing or cyanosis. No edema.  Distal pedal pulses are 2+ and equal    Neuro: CN II - XII intact.  Alert and oriented X 3.   Psych:  Normal   ECG: 02/21/2013: Normal sinus rhythm at 87. She has no ST or T wave changes. EKG is unremarkable to  Assessment / Plan:

## 2013-03-07 NOTE — Assessment & Plan Note (Signed)
We were asked to see her for further evaluation of her dyspnea. She has known severe asthma with her spirometry indicating FVC and FEV1 of  65% of her predicted values.  She's had a normal cardiac catheterization in 2008.  She has an appointment to see the pulmonologist on Friday. She does not want to do any further cardiac workup until after she seen pulmonary.  At this point I have encouraged her to work on a good diet, exercise, and weight loss program. We could perform an echocardiogram. It is possible that some of this is due to diastolic dysfunction. I've explained that this is an age-related thickening and also may be related to her weight. Once again encouraged her to lose weight which will help with her shortness breath no matter what the etiology is.  She does not need a stress test.  We will see her back on an as-needed basis.

## 2013-03-11 ENCOUNTER — Ambulatory Visit (INDEPENDENT_AMBULATORY_CARE_PROVIDER_SITE_OTHER)
Admission: RE | Admit: 2013-03-11 | Discharge: 2013-03-11 | Disposition: A | Discharge status: Home / Self Care | Payer: Medicare HMO | Source: Ambulatory Visit | Attending: Critical Care Medicine | Admitting: Critical Care Medicine

## 2013-03-11 ENCOUNTER — Encounter: Payer: Self-pay | Admitting: Critical Care Medicine

## 2013-03-11 ENCOUNTER — Other Ambulatory Visit: Payer: Commercial Managed Care - HMO

## 2013-03-11 ENCOUNTER — Ambulatory Visit (INDEPENDENT_AMBULATORY_CARE_PROVIDER_SITE_OTHER): Payer: Commercial Managed Care - HMO | Admitting: Critical Care Medicine

## 2013-03-11 VITALS — BP 128/78 | HR 79 | Temp 98.0°F | Ht 64.0 in | Wt 228.4 lb

## 2013-03-11 DIAGNOSIS — J4550 Severe persistent asthma, uncomplicated: Secondary | ICD-10-CM

## 2013-03-11 DIAGNOSIS — J45909 Unspecified asthma, uncomplicated: Secondary | ICD-10-CM

## 2013-03-11 NOTE — Progress Notes (Signed)
Quick Note:  Notify the patient that the Xray is stable and no pneumonia No change in medications are recommended. Continue current meds as prescribed at last office visit ______ 

## 2013-03-11 NOTE — Assessment & Plan Note (Signed)
Asthmatic rhonchi this with increased lower airway inflammation.  Need to evaluate for COPD features Plan Maintain Symbicort twice daily Obtain pulmonary function studies Note chest x-ray shows no active process

## 2013-03-11 NOTE — Progress Notes (Signed)
Subjective:    Patient ID: [name], female    DOB: July 26, 1940, 73 y.o.   MRN: [id number]  HPI Comments: Hx of dyspnea, prior Dx asthma x 73yrs.  Pt with recent URI, ear issues. Rx ABX per pcp. Since off is worse Pt with chronic asthma and bronchitis.  Notes productive cough, no real chest pain. Notes prior pna. Never smoker  Shortness of Breath This is a new problem. The current episode started 1 to 4 weeks ago. The problem occurs daily (dyspnea with exertion only,  prod cough). The problem has been waxing and waning. Associated symptoms include ear pain, headaches, sputum production and wheezing. Pertinent negatives include no abdominal pain, chest pain, claudication, coryza, fever, hemoptysis, leg pain, leg swelling, neck pain, orthopnea, PND, rash, rhinorrhea, sore throat, swollen glands, syncope or vomiting. The symptoms are aggravated by any activity, exercise, URIs, weather changes, lying flat, fumes, odors and smoke. She has tried beta agonist inhalers (ABX, spiriva) for the symptoms. The treatment provided significant relief. Her past medical history is significant for asthma and pneumonia. There is no history of allergies, aspirin allergies, bronchiolitis, CAD, chronic lung disease, COPD, DVT, a heart failure, PE or a recent surgery.    Past Medical History  Diagnosis Date  . Asthma     PFT 02/06/09 FEV1 1.41 (65%), FVC 1.92 (64), FEV1% 74, TLC 3.47 (71%), DLCO 48%, +BD  . ACE-inhibitor cough   . DVT (deep venous thrombosis) 1987  . GERD (gastroesophageal reflux disease)   . Arthritis   . Atypical chest pain     s/p cath, Normal coronaries, Non ST elevation myocardial infarction, Rt groin pseudoaneurysm  . Hyperlipidemia   . Hypertension   . Depression   . Gout   . Abnormal glucose tolerance test   . Sun-damaged skin 10/24/2012  . Urinary frequency 10/24/2012     Family History  Problem Relation Age of Onset  . Coronary artery disease Sister   . Asthma Sister   .  Coronary artery disease Brother   . Coronary artery disease Other   . Coronary artery disease Brother   . Coronary artery disease Sister   . Coronary artery disease Sister   . Coronary artery disease Brother   . Cancer Sister     uterine  . Emphysema Sister   . Emphysema Brother      History   Social History  . Marital Status: Widowed    Spouse Name: N/A    Number of Children: 1  . Years of Education: N/A   Occupational History  . COLLECTIONS     [employer]   Social History Main Topics  . Smoking status: Never Smoker   . Smokeless tobacco: Never Used  . Alcohol Use: No  . Drug Use: No  . Sexual Activity: Not on file   Other Topics Concern  . Not on file   Social History Narrative  . No narrative on file     Allergies  Allergen Reactions  . Ace Inhibitors   . Indomethacin   . Montelukast Sodium     REACTION: Heart palpitations, chest pain  . Oseltamivir Phosphate     REACTION: nausea, vomiting, diarrhea, dizziness  . Sulfonamide Derivatives      Outpatient Prescriptions Prior to Visit  Medication Sig Dispense Refill  . albuterol (ACCUNEB) 0.63 MG/3ML nebulizer solution USE ONE VIAL IN NEBULIZER EVERY 6 HOURS AS NEEDED  3 mL  2  . albuterol (PROAIR HFA) 108 (90 BASE) MCG/ACT inhaler  Inhale 2 puffs into the lungs every 4 (four) hours as needed.  3 Inhaler  3  . alendronate (FOSAMAX) 70 MG tablet Take 1 tablet (70 mg total) by mouth every 7 (seven) days. Take with a full glass of water on an empty stomach.  12 tablet  3  . allopurinol (ZYLOPRIM) 100 MG tablet Take 2 tablets (200 mg total) by mouth daily.  60 tablet  5  . baclofen (LIORESAL) 10 MG tablet Take 1 tablet (10 mg total) by mouth 3 (three) times daily as needed.  40 each  1  . budesonide-formoterol (SYMBICORT) 160-4.5 MCG/ACT inhaler Inhale 2 puffs into the lungs 2 (two) times daily.  3 Inhaler  3  . furosemide (LASIX) 40 MG tablet Take 0.5 tablets (20 mg total) by mouth every other day.  90 tablet  3  .  glucose blood (ONE TOUCH TEST STRIPS) test strip Use as instructed to check blood sugar once weekly. DX 790.29       . losartan (COZAAR) 50 MG tablet Take 1 tablet (50 mg total) by mouth daily.  90 tablet  3  . montelukast (SINGULAIR) 10 MG tablet Take 1 tablet (10 mg total) by mouth at bedtime.  30 tablet  5  . pantoprazole (PROTONIX) 40 MG tablet Take 1 tablet (40 mg total) by mouth daily.  90 tablet  1  . potassium chloride SA (KLOR-CON M20) 20 MEQ tablet Take 1 tablet (20 mEq total) by mouth daily.  90 tablet  3  . pravastatin (PRAVACHOL) 20 MG tablet Take 1 tablet (20 mg total) by mouth daily.  90 tablet  1  . sertraline (ZOLOFT) 50 MG tablet Take 1 tablet (50 mg total) by mouth daily.  90 tablet  3  . tiotropium (SPIRIVA) 18 MCG inhalation capsule Place 1 capsule (18 mcg total) into inhaler and inhale daily.  90 capsule  3  . traMADol (ULTRAM) 50 MG tablet Take 1 tablet (50 mg total) by mouth every 8 (eight) hours as needed.  90 tablet  2  . fluticasone (FLONASE) 50 MCG/ACT nasal spray USE TWO SPRAYS IN EACH NOSTRIL EVERY DAY  16 g  0   No facility-administered medications prior to visit.      Review of Systems  Constitutional: Positive for fatigue. Negative for fever, chills, diaphoresis, activity change, appetite change and unexpected weight change.  HENT: Positive for congestion, ear pain, nosebleeds, postnasal drip, sinus pressure, sneezing, tinnitus and voice change. Negative for dental problem, ear discharge, facial swelling, hearing loss, mouth sores, rhinorrhea, sore throat and trouble swallowing.   Eyes: Positive for itching. Negative for photophobia, discharge and visual disturbance.  Respiratory: Positive for sputum production, chest tightness, shortness of breath and wheezing. Negative for apnea, cough, hemoptysis, choking and stridor.   Cardiovascular: Positive for palpitations. Negative for chest pain, orthopnea, claudication, leg swelling, syncope and PND.   Gastrointestinal: Negative for nausea, vomiting, abdominal pain, constipation, blood in stool and abdominal distention.       Notes GERD symptoms  Genitourinary: Negative for dysuria, urgency, frequency, hematuria, flank pain, decreased urine volume and difficulty urinating.  Musculoskeletal: Positive for back pain and joint swelling. Negative for arthralgias, gait problem, myalgias, neck pain and neck stiffness.  Skin: Negative for color change, pallor and rash.  Neurological: Positive for dizziness, weakness and headaches. Negative for tremors, seizures, syncope, speech difficulty, light-headedness and numbness.  Hematological: Negative for adenopathy. Bruises/bleeds easily.  Psychiatric/Behavioral: Negative for confusion, sleep disturbance and agitation. The patient is not  nervous/anxious.        Objective:   Physical Exam Filed Vitals:   03/11/13 0933 03/11/13 0939  BP:  128/78  Pulse:  79  Temp: 98 F (36.7 C) 98 F (36.7 C)  TempSrc: Oral Oral  Height:  5\' 4"  (1.626 m)  Weight:  228 lb 6.4 oz (103.602 kg)  SpO2: 89% 96%    Gen: Pleasant, well-nourished, in no distress,  normal affect  ENT: No lesions,  mouth clear,  oropharynx clear, no postnasal drip  Neck: No JVD, no TMG, no carotid bruits  Lungs: No use of accessory muscles, no dullness to percussion, distant breath sounds  Cardiovascular: RRR, heart sounds normal, no murmur or gallops, no peripheral edema  Abdomen: soft and NT, no HSM,  BS normal  Musculoskeletal: No deformities, no cyanosis or clubbing  Neuro: alert, non focal  Skin: Warm, no lesions or rashes  Dg Chest 2 View  03/11/2013   CLINICAL DATA:  Worsening shortness of breath over the last few weeks, hypertension, history of asthma  EXAM: CHEST  2 VIEW  COMPARISON:  Chest x-ray of 11/20/2011  FINDINGS: No active infiltrate or effusion is seen. Slightly prominent markings remain consistent with mild chronic interstitial change. Slight elevation of  the left hemidiaphragm is stable. The heart is within normal limits in size. There are degenerative changes diffusely throughout the thoracic spine.  IMPRESSION: Stable chest x-ray.  No active lung disease.   Electronically Signed   By: Ivar Drape M.D.   On: 03/11/2013 10:44          Assessment & Plan:   ASTHMA Asthmatic rhonchi this with increased lower airway inflammation.  Need to evaluate for COPD features Plan Maintain Symbicort twice daily Obtain pulmonary function studies Note chest x-ray shows no active process    Updated Medication List Outpatient Encounter Prescriptions as of 03/11/2013  Medication Sig  . acetaminophen (TYLENOL) 500 MG tablet Take 500 mg by mouth every 6 (six) hours as needed.  Marland Kitchen albuterol (ACCUNEB) 0.63 MG/3ML nebulizer solution USE ONE VIAL IN NEBULIZER EVERY 6 HOURS AS NEEDED  . albuterol (PROAIR HFA) 108 (90 BASE) MCG/ACT inhaler Inhale 2 puffs into the lungs every 4 (four) hours as needed.  Marland Kitchen alendronate (FOSAMAX) 70 MG tablet Take 1 tablet (70 mg total) by mouth every 7 (seven) days. Take with a full glass of water on an empty stomach.  Marland Kitchen allopurinol (ZYLOPRIM) 100 MG tablet Take 2 tablets (200 mg total) by mouth daily.  . baclofen (LIORESAL) 10 MG tablet Take 1 tablet (10 mg total) by mouth 3 (three) times daily as needed.  . budesonide-formoterol (SYMBICORT) 160-4.5 MCG/ACT inhaler Inhale 2 puffs into the lungs 2 (two) times daily.  . Cyanocobalamin (VITAMIN B-12 PO) Take by mouth daily.  . fluticasone (FLONASE) 50 MCG/ACT nasal spray USE TWO SPRAYS IN EACH NOSTRIL EVERY DAY as needed  . furosemide (LASIX) 40 MG tablet Take 0.5 tablets (20 mg total) by mouth every other day.  Marland Kitchen glucose blood (ONE TOUCH TEST STRIPS) test strip Use as instructed to check blood sugar once weekly. DX 790.29   . losartan (COZAAR) 50 MG tablet Take 1 tablet (50 mg total) by mouth daily.  . montelukast (SINGULAIR) 10 MG tablet Take 1 tablet (10 mg total) by mouth at  bedtime.  . Niacin (VITAMIN B-3 PO) Take 1 tablet by mouth daily.  . pantoprazole (PROTONIX) 40 MG tablet Take 1 tablet (40 mg total) by mouth daily.  . potassium chloride  SA (KLOR-CON M20) 20 MEQ tablet Take 1 tablet (20 mEq total) by mouth daily.  . pravastatin (PRAVACHOL) 20 MG tablet Take 1 tablet (20 mg total) by mouth daily.  . sertraline (ZOLOFT) 50 MG tablet Take 1 tablet (50 mg total) by mouth daily.  Marland Kitchen tiotropium (SPIRIVA) 18 MCG inhalation capsule Place 1 capsule (18 mcg total) into inhaler and inhale daily.  . traMADol (ULTRAM) 50 MG tablet Take 1 tablet (50 mg total) by mouth every 8 (eight) hours as needed.  . [DISCONTINUED] fluticasone (FLONASE) 50 MCG/ACT nasal spray USE TWO SPRAYS IN EACH NOSTRIL EVERY DAY

## 2013-03-11 NOTE — Patient Instructions (Signed)
Lung function study will be done Chest xray today Stay on symbicort /spiriva for now Labs today: allergy testing / alpha one antitrypsin assay Return 6 weeks [place]

## 2013-03-11 NOTE — Progress Notes (Signed)
Quick Note:  Called and spoke with pt. Informed pt of CXR results and recs. She verbalized her understanding and voiced no other concerns at this time. ______

## 2013-03-12 LAB — ALPHA-1-ANTITRYPSIN: A-1 Antitrypsin, Ser: 167 mg/dL (ref 90–200)

## 2013-03-14 ENCOUNTER — Telehealth: Payer: Self-pay

## 2013-03-14 LAB — ALLERGY FULL PROFILE
Allergen, D pternoyssinus,d7: <0.1 kU/L
Allergen,Goose feathers, e70: <0.1 kU/L
Alternaria Alternata: <0.1 kU/L
Aspergillus fumigatus, m3: <0.1 kU/L
Bahia Grass: <0.1 kU/L
Bermuda Grass: <0.1 kU/L
Box Elder IgE: <0.1 kU/L
Candida Albicans: <0.1 kU/L
Cat Dander: <0.1 kU/L
Common Ragweed: <0.1 kU/L
Curvularia lunata: <0.1 kU/L
D. farinae: <0.1 kU/L
Dog Dander: <0.1 kU/L
Elm IgE: <0.1 kU/L
Fescue: <0.1 kU/L
G005 Rye, Perennial: <0.1 kU/L
G009 Red Top: <0.1 kU/L
Goldenrod: <0.1 kU/L
Helminthosporium halodes: <0.1 kU/L
House Dust Hollister: <0.1 kU/L
IgE (Immunoglobulin E), Serum: 11.1 IU/mL (ref 0.0–180.0)
Lamb's Quarters: <0.1 kU/L
Oak: <0.1 kU/L
Plantain: <0.1 kU/L
Stemphylium Botryosum: <0.1 kU/L
Sycamore Tree: <0.1 kU/L
Timothy Grass: <0.1 kU/L

## 2013-03-14 MED ORDER — PANTOPRAZOLE SODIUM 40 MG PO TBEC: 40.0000 mg | DELAYED_RELEASE_TABLET | Freq: Every day | ORAL | Status: DC

## 2013-03-14 MED ORDER — FUROSEMIDE 40 MG PO TABS: 20.0000 mg | ORAL_TABLET | ORAL | Status: DC

## 2013-03-14 MED ORDER — ALENDRONATE SODIUM 70 MG PO TABS: 70.0000 mg | ORAL_TABLET | ORAL | Status: DC

## 2013-03-14 MED ORDER — POTASSIUM CHLORIDE CRYS ER 20 MEQ PO TBCR: 20.0000 meq | EXTENDED_RELEASE_TABLET | Freq: Every day | ORAL | Status: DC

## 2013-03-14 NOTE — Telephone Encounter (Signed)
OK to send all except the Lorazepam, I do not see that in her chart but I do see Losartan is that what she needs? If so she can have that as well. 90 day supply with 1 rf on all

## 2013-03-14 NOTE — Telephone Encounter (Signed)
Patient left a message stating that she needs new rx's sent to [facility].  Alendronate, protonix, lorazepam, klor-con, furosemide  I sent everything in except lorazepam? Please advise refill? I don't see where patient takes this?

## 2013-03-15 MED ORDER — LOSARTAN POTASSIUM 50 MG PO TABS: 50.0000 mg | ORAL_TABLET | Freq: Every day | ORAL | Status: DC

## 2013-03-15 NOTE — Telephone Encounter (Signed)
Losartan was sent on 02-09-12 with 3 refills. i will send losartan

## 2013-04-19 ENCOUNTER — Encounter: Payer: Self-pay | Admitting: Critical Care Medicine

## 2013-04-19 ENCOUNTER — Ambulatory Visit (INDEPENDENT_AMBULATORY_CARE_PROVIDER_SITE_OTHER): Payer: Medicare HMO | Admitting: Critical Care Medicine

## 2013-04-19 VITALS — BP 112/68 | HR 81 | Temp 98.2°F | Ht 65.0 in | Wt 224.0 lb

## 2013-04-19 DIAGNOSIS — J45909 Unspecified asthma, uncomplicated: Secondary | ICD-10-CM

## 2013-04-19 MED ORDER — PREDNISONE 10 MG PO TABS: ORAL_TABLET | ORAL | Status: DC

## 2013-04-19 NOTE — Progress Notes (Signed)
Subjective:    Patient ID: [name], female    DOB: 03/19/1940, 72 y.o.   MRN: [id number]  HPI Comments: Hx of dyspnea, prior Dx asthma x 10yrs.  Pt with recent URI, ear issues. Rx ABX per pcp. Since off is worse Pt with chronic asthma and bronchitis.  Notes productive cough, no real chest pain. Notes prior pna. Never smoker  04/19/2013 Chief Complaint  Patient presents with  . Follow-up    6 week f/u - SOB about the same - occas wheezing - Denies cough  Not much change.  Symbicort does help.  occ wheezing . No cough or chest pain. No qhs dyspnea  Pt denies any significant sore throat, nasal congestion or excess secretions, fever, chills, sweats, unintended weight loss, pleurtic or exertional chest pain, orthopnea PND, or leg swelling Pt denies any increase in rescue therapy over baseline, denies waking up needing it or having any early am or nocturnal exacerbations of coughing/wheezing/or dyspnea. Pt also denies any obvious fluctuation in symptoms with  weather or environmental change or other alleviating or aggravating factors    Past Medical History  Diagnosis Date  . Asthma     PFT 02/06/09 FEV1 1.41 (65%), FVC 1.92 (64), FEV1% 74, TLC 3.47 (71%), DLCO 48%, +BD  . ACE-inhibitor cough   . DVT (deep venous thrombosis) 1987  . GERD (gastroesophageal reflux disease)   . Arthritis   . Atypical chest pain     s/p cath, Normal coronaries, Non ST elevation myocardial infarction, Rt groin pseudoaneurysm  . Hyperlipidemia   . Hypertension   . Depression   . Gout   . Abnormal glucose tolerance test   . Sun-damaged skin 10/24/2012  . Urinary frequency 10/24/2012     Family History  Problem Relation Age of Onset  . Coronary artery disease Sister   . Asthma Sister   . Coronary artery disease Brother   . Coronary artery disease Other   . Coronary artery disease Brother   . Coronary artery disease Sister   . Coronary artery disease Sister   . Coronary artery disease Brother    . Cancer Sister     uterine  . Emphysema Sister   . Emphysema Brother      History   Social History  . Marital Status: Widowed    Spouse Name: N/A    Number of Children: 1  . Years of Education: N/A   Occupational History  . COLLECTIONS     [employer]   Social History Main Topics  . Smoking status: Never Smoker   . Smokeless tobacco: Never Used  . Alcohol Use: No  . Drug Use: No  . Sexual Activity: Not on file   Other Topics Concern  . Not on file   Social History Narrative  . No narrative on file     Allergies  Allergen Reactions  . Ace Inhibitors   . Indomethacin   . Montelukast Sodium     REACTION: Heart palpitations, chest pain  . Oseltamivir Phosphate     REACTION: nausea, vomiting, diarrhea, dizziness  . Sulfonamide Derivatives      Outpatient Prescriptions Prior to Visit  Medication Sig Dispense Refill  . acetaminophen (TYLENOL) 500 MG tablet Take 500 mg by mouth every 6 (six) hours as needed.      Marland Kitchen albuterol (ACCUNEB) 0.63 MG/3ML nebulizer solution USE ONE VIAL IN NEBULIZER EVERY 6 HOURS AS NEEDED  3 mL  2  . albuterol (PROAIR HFA) 108 (90 BASE)  MCG/ACT inhaler Inhale 2 puffs into the lungs every 4 (four) hours as needed.  3 Inhaler  3  . alendronate (FOSAMAX) 70 MG tablet Take 1 tablet (70 mg total) by mouth every 7 (seven) days. Take with a full glass of water on an empty stomach.  12 tablet  1  . allopurinol (ZYLOPRIM) 100 MG tablet Take 2 tablets (200 mg total) by mouth daily.  60 tablet  5  . baclofen (LIORESAL) 10 MG tablet Take 1 tablet (10 mg total) by mouth 3 (three) times daily as needed.  40 each  1  . budesonide-formoterol (SYMBICORT) 160-4.5 MCG/ACT inhaler Inhale 2 puffs into the lungs 2 (two) times daily.  3 Inhaler  3  . Cyanocobalamin (VITAMIN B-12 PO) Take by mouth daily.      . fluticasone (FLONASE) 50 MCG/ACT nasal spray USE TWO SPRAYS IN EACH NOSTRIL EVERY DAY as needed      . furosemide (LASIX) 40 MG tablet Take 0.5 tablets (20 mg  total) by mouth every other day.  90 tablet  1  . glucose blood (ONE TOUCH TEST STRIPS) test strip Use as instructed to check blood sugar once weekly. DX 790.29       . losartan (COZAAR) 50 MG tablet Take 1 tablet (50 mg total) by mouth daily.  90 tablet  1  . montelukast (SINGULAIR) 10 MG tablet Take 1 tablet (10 mg total) by mouth at bedtime.  30 tablet  5  . Niacin (VITAMIN B-3 PO) Take 1 tablet by mouth daily.      . pantoprazole (PROTONIX) 40 MG tablet Take 1 tablet (40 mg total) by mouth daily.  90 tablet  1  . potassium chloride SA (KLOR-CON M20) 20 MEQ tablet Take 1 tablet (20 mEq total) by mouth daily.  90 tablet  1  . pravastatin (PRAVACHOL) 20 MG tablet Take 1 tablet (20 mg total) by mouth daily.  90 tablet  1  . sertraline (ZOLOFT) 50 MG tablet Take 1 tablet (50 mg total) by mouth daily.  90 tablet  3  . tiotropium (SPIRIVA) 18 MCG inhalation capsule Place 1 capsule (18 mcg total) into inhaler and inhale daily.  90 capsule  3  . traMADol (ULTRAM) 50 MG tablet Take 1 tablet (50 mg total) by mouth every 8 (eight) hours as needed.  90 tablet  2   No facility-administered medications prior to visit.      Review of Systems  Constitutional: Positive for fatigue. Negative for chills, diaphoresis, activity change, appetite change and unexpected weight change.  HENT: Positive for congestion, nosebleeds, postnasal drip, sinus pressure, sneezing, tinnitus and voice change. Negative for dental problem, ear discharge, facial swelling, hearing loss, mouth sores and trouble swallowing.   Eyes: Positive for itching. Negative for photophobia, discharge and visual disturbance.  Respiratory: Positive for chest tightness. Negative for apnea, cough, choking and stridor.   Cardiovascular: Positive for palpitations.  Gastrointestinal: Negative for nausea, constipation, blood in stool and abdominal distention.       Notes GERD symptoms  Genitourinary: Negative for dysuria, urgency, frequency, hematuria,  flank pain, decreased urine volume and difficulty urinating.  Musculoskeletal: Positive for back pain and joint swelling. Negative for arthralgias, gait problem, myalgias and neck stiffness.  Skin: Negative for color change and pallor.  Neurological: Positive for dizziness and weakness. Negative for tremors, seizures, syncope, speech difficulty, light-headedness and numbness.  Hematological: Negative for adenopathy. Bruises/bleeds easily.  Psychiatric/Behavioral: Negative for confusion, sleep disturbance and agitation. The patient is  not nervous/anxious.        Objective:   Physical Exam  Filed Vitals:   04/19/13 1103  BP: 112/68  Pulse: 81  Temp: 98.2 F (36.8 C)  TempSrc: Oral  Height: 5\' 5"  (1.651 m)  Weight: 224 lb (101.606 kg)  SpO2: 92%    Gen: Pleasant, well-nourished, in no distress,  normal affect  ENT: No lesions,  mouth clear,  oropharynx clear, no postnasal drip  Neck: No JVD, no TMG, no carotid bruits  Lungs: No use of accessory muscles, no dullness to percussion, distant breath sounds  Cardiovascular: RRR, heart sounds normal, no murmur or gallops, no peripheral edema  Abdomen: soft and NT, no HSM,  BS normal  Musculoskeletal: No deformities, no cyanosis or clubbing  Neuro: alert, non focal  Skin: Warm, no lesions or rashes  No results found.        Assessment & Plan:   Asthma, moderate persistent Moderate persistent asthma Plan Use spacer on symbicort Prednisone 10mg  Take 4 tablets daily for 5 days then stop Stay on spiriva Get pulmonary functions done Return 2 months     Updated Medication List Outpatient Encounter Prescriptions as of 04/19/2013  Medication Sig  . acetaminophen (TYLENOL) 500 MG tablet Take 500 mg by mouth every 6 (six) hours as needed.  Marland Kitchen albuterol (ACCUNEB) 0.63 MG/3ML nebulizer solution USE ONE VIAL IN NEBULIZER EVERY 6 HOURS AS NEEDED  . albuterol (PROAIR HFA) 108 (90 BASE) MCG/ACT inhaler Inhale 2 puffs into the  lungs every 4 (four) hours as needed.  Marland Kitchen alendronate (FOSAMAX) 70 MG tablet Take 1 tablet (70 mg total) by mouth every 7 (seven) days. Take with a full glass of water on an empty stomach.  Marland Kitchen allopurinol (ZYLOPRIM) 100 MG tablet Take 2 tablets (200 mg total) by mouth daily.  . baclofen (LIORESAL) 10 MG tablet Take 1 tablet (10 mg total) by mouth 3 (three) times daily as needed.  . budesonide-formoterol (SYMBICORT) 160-4.5 MCG/ACT inhaler Inhale 2 puffs into the lungs 2 (two) times daily.  . Cyanocobalamin (VITAMIN B-12 PO) Take by mouth daily.  . fluticasone (FLONASE) 50 MCG/ACT nasal spray USE TWO SPRAYS IN EACH NOSTRIL EVERY DAY as needed  . furosemide (LASIX) 40 MG tablet Take 0.5 tablets (20 mg total) by mouth every other day.  Marland Kitchen glucose blood (ONE TOUCH TEST STRIPS) test strip Use as instructed to check blood sugar once weekly. DX 790.29   . losartan (COZAAR) 50 MG tablet Take 1 tablet (50 mg total) by mouth daily.  . montelukast (SINGULAIR) 10 MG tablet Take 1 tablet (10 mg total) by mouth at bedtime.  . Niacin (VITAMIN B-3 PO) Take 1 tablet by mouth daily.  . pantoprazole (PROTONIX) 40 MG tablet Take 1 tablet (40 mg total) by mouth daily.  . potassium chloride SA (KLOR-CON M20) 20 MEQ tablet Take 1 tablet (20 mEq total) by mouth daily.  . pravastatin (PRAVACHOL) 20 MG tablet Take 1 tablet (20 mg total) by mouth daily.  . sertraline (ZOLOFT) 50 MG tablet Take 1 tablet (50 mg total) by mouth daily.  Marland Kitchen tiotropium (SPIRIVA) 18 MCG inhalation capsule Place 1 capsule (18 mcg total) into inhaler and inhale daily.  . traMADol (ULTRAM) 50 MG tablet Take 1 tablet (50 mg total) by mouth every 8 (eight) hours as needed.  . predniSONE (DELTASONE) 10 MG tablet Take 4 tablets daily for 5 days then stop

## 2013-04-19 NOTE — Patient Instructions (Signed)
Use spacer on symbicort Prednisone 10mg  Take 4 tablets daily for 5 days then stop Stay on spiriva Get pulmonary functions done Return 2 months

## 2013-04-19 NOTE — Assessment & Plan Note (Signed)
Moderate persistent asthma Plan Use spacer on symbicort Prednisone 10mg  Take 4 tablets daily for 5 days then stop Stay on spiriva Get pulmonary functions done Return 2 months

## 2013-04-26 ENCOUNTER — Ambulatory Visit (INDEPENDENT_AMBULATORY_CARE_PROVIDER_SITE_OTHER): Payer: Commercial Managed Care - HMO | Admitting: Critical Care Medicine

## 2013-04-26 DIAGNOSIS — J4550 Severe persistent asthma, uncomplicated: Secondary | ICD-10-CM

## 2013-04-26 DIAGNOSIS — J45909 Unspecified asthma, uncomplicated: Secondary | ICD-10-CM

## 2013-04-26 LAB — PULMONARY FUNCTION TEST
DL/VA % pred: 99 %
DL/VA: 4.71 ml/min/mmHg/L
DLCO UNC % PRED: 64 %
DLCO unc: 15.1 ml/min/mmHg
FEF 25-75 PRE: 1.21 L/s
FEF 25-75 Post: 1.02 L/sec
FEF2575-%Change-Post: -15 %
FEF2575-%Pred-Post: 57 %
FEF2575-%Pred-Pre: 68 %
FEV1-%CHANGE-POST: -2 %
FEV1-%PRED-POST: 66 %
FEV1-%Pred-Pre: 67 %
FEV1-PRE: 1.47 L
FEV1-Post: 1.43 L
FEV1FVC-%Change-Post: 2 %
FEV1FVC-%PRED-PRE: 103 %
FEV6-%Change-Post: -5 %
FEV6-%PRED-POST: 65 %
FEV6-%Pred-Pre: 68 %
FEV6-PRE: 1.88 L
FEV6-Post: 1.78 L
FEV6FVC-%CHANGE-POST: 0 %
FEV6FVC-%PRED-PRE: 104 %
FEV6FVC-%Pred-Post: 104 %
FVC-%Change-Post: -5 %
FVC-%PRED-PRE: 66 %
FVC-%Pred-Post: 62 %
FVC-POST: 1.8 L
FVC-Pre: 1.89 L
Post FEV1/FVC ratio: 80 %
Post FEV6/FVC ratio: 99 %
Pre FEV1/FVC ratio: 77 %
Pre FEV6/FVC Ratio: 99 %
RV % pred: 59 %
RV: 1.32 L
TLC % PRED: 66 %
TLC: 3.34 L

## 2013-04-26 NOTE — Progress Notes (Signed)
PFT done today. 

## 2013-04-28 NOTE — Progress Notes (Signed)
Quick Note:  Called, spoke with pt. Informed her of pft results and recs per Dr. Joya Gaskins. She verbalized understanding and voiced no further questions or concerns at this time. ______

## 2013-05-03 ENCOUNTER — Other Ambulatory Visit: Payer: Self-pay

## 2013-05-03 MED ORDER — SERTRALINE HCL 50 MG PO TABS: 50.0000 mg | ORAL_TABLET | Freq: Every day | ORAL | Status: DC

## 2013-05-03 MED ORDER — PRAVASTATIN SODIUM 20 MG PO TABS: 20.0000 mg | ORAL_TABLET | Freq: Every day | ORAL | Status: DC

## 2013-05-14 ENCOUNTER — Other Ambulatory Visit: Payer: Self-pay | Admitting: Family Medicine

## 2013-05-16 NOTE — Telephone Encounter (Signed)
eScribe request from [facility] for refill on Baclofen 10 mg Last filled - 04.01.2014, #[id number] Last AEX - 02.02.15 [Back pain not mentioned since 07.01.2014 OV] Next AEX - 3-Mths [appt scheduled for 05.12.15] Please Advise on refills/SLS

## 2013-05-19 ENCOUNTER — Other Ambulatory Visit: Payer: Self-pay | Admitting: Family Medicine

## 2013-05-19 NOTE — Telephone Encounter (Signed)
Please advise refill? 

## 2013-05-27 LAB — LIPID PANEL
CHOL/HDL RATIO: 3 ratio
Cholesterol: 140 mg/dL (ref 0–200)
HDL: 47 mg/dL (ref >39)
LDL Cholesterol: 64 mg/dL (ref 0–99)
TRIGLYCERIDES: 145 mg/dL (ref <150)
VLDL: 29 mg/dL (ref 0–40)

## 2013-05-27 LAB — RENAL FUNCTION PANEL
Albumin: 4.3 g/dL (ref 3.5–5.2)
BUN: 26 mg/dL — AB (ref 6–23)
CALCIUM: 9.9 mg/dL (ref 8.4–10.5)
CO2: 25 meq/L (ref 19–32)
Chloride: 102 mEq/L (ref 96–112)
Creat: 1.2 mg/dL — ABNORMAL HIGH (ref 0.50–1.10)
GLUCOSE: 96 mg/dL (ref 70–99)
POTASSIUM: 4.5 meq/L (ref 3.5–5.3)
Phosphorus: 3.2 mg/dL (ref 2.3–4.6)
SODIUM: 137 meq/L (ref 135–145)

## 2013-05-27 LAB — HEPATIC FUNCTION PANEL
ALK PHOS: 49 U/L (ref 39–117)
ALT: 13 U/L (ref 0–35)
AST: 15 U/L (ref 0–37)
Albumin: 4.3 g/dL (ref 3.5–5.2)
BILIRUBIN INDIRECT: 0.4 mg/dL (ref 0.2–1.2)
BILIRUBIN TOTAL: 0.5 mg/dL (ref 0.2–1.2)
Bilirubin, Direct: 0.1 mg/dL (ref 0.0–0.3)
Total Protein: 6.7 g/dL (ref 6.0–8.3)

## 2013-05-27 LAB — CBC
HEMATOCRIT: 35.9 % — AB (ref 36.0–46.0)
Hemoglobin: 12.1 g/dL (ref 12.0–15.0)
MCH: 30.3 pg (ref 26.0–34.0)
MCHC: 33.7 g/dL (ref 30.0–36.0)
MCV: 90 fL (ref 78.0–100.0)
Platelets: 159 10*3/uL (ref 150–400)
RBC: 3.99 MIL/uL (ref 3.87–5.11)
RDW: 13.9 % (ref 11.5–15.5)
WBC: 6.8 10*3/uL (ref 4.0–10.5)

## 2013-05-27 LAB — TSH: TSH: 1.956 u[IU]/mL (ref 0.350–4.500)

## 2013-05-27 LAB — HEMOGLOBIN A1C
Hgb A1c MFr Bld: 6 % — ABNORMAL HIGH (ref <5.7)
MEAN PLASMA GLUCOSE: 126 mg/dL — AB (ref <117)

## 2013-05-31 ENCOUNTER — Ambulatory Visit (INDEPENDENT_AMBULATORY_CARE_PROVIDER_SITE_OTHER): Payer: Commercial Managed Care - HMO | Admitting: Family Medicine

## 2013-05-31 ENCOUNTER — Encounter: Payer: Self-pay | Admitting: Family Medicine

## 2013-05-31 VITALS — BP 122/90 | HR 92 | Temp 98.1°F | Ht 65.0 in | Wt 224.0 lb

## 2013-05-31 DIAGNOSIS — E785 Hyperlipidemia, unspecified: Secondary | ICD-10-CM

## 2013-05-31 DIAGNOSIS — J309 Allergic rhinitis, unspecified: Secondary | ICD-10-CM

## 2013-05-31 DIAGNOSIS — R7309 Other abnormal glucose: Secondary | ICD-10-CM

## 2013-05-31 DIAGNOSIS — I10 Essential (primary) hypertension: Secondary | ICD-10-CM

## 2013-05-31 DIAGNOSIS — K5900 Constipation, unspecified: Secondary | ICD-10-CM

## 2013-05-31 DIAGNOSIS — M109 Gout, unspecified: Secondary | ICD-10-CM

## 2013-05-31 DIAGNOSIS — M549 Dorsalgia, unspecified: Secondary | ICD-10-CM

## 2013-05-31 NOTE — Patient Instructions (Signed)
Moist heat, gentle stretching to back and ice to shins Try Salon Pas, Aspercreme or icy Hot patches or cream Try a different probiotic, [facility] Add a fiber such as Benefiber powder  Encouraged increased hydration and fiber in diet. Daily probiotics. If bowels not moving can use MOM 2 tbls po in 4 oz of warm prune juice by mouth every 2-3 days. If no results then repeat in 4 hours with  Dulcolax suppository pr, may repeat again in 4 more hours as needed. Seek care if symptoms worsen. Consider daily Miralax and/or Dulcolax if symptoms persist.   Back Pain, Adult Low back pain is very common. About 1 in 5 people have back pain.The cause of low back pain is rarely dangerous. The pain often gets better over time.About half of people with a sudden onset of back pain feel better in just 2 weeks. About 8 in 10 people feel better by 6 weeks.  CAUSES Some common causes of back pain include:  Strain of the muscles or ligaments supporting the spine.  Wear and tear (degeneration) of the spinal discs.  Arthritis.  Direct injury to the back. DIAGNOSIS Most of the time, the direct cause of low back pain is not known.However, back pain can be treated effectively even when the exact cause of the pain is unknown.Answering your caregiver's questions about your overall health and symptoms is one of the most accurate ways to make sure the cause of your pain is not dangerous. If your caregiver needs more information, he or she may order lab work or imaging tests (X-rays or MRIs).However, even if imaging tests show changes in your back, this usually does not require surgery. HOME CARE INSTRUCTIONS For many people, back pain returns.Since low back pain is rarely dangerous, it is often a condition that people can learn to [facility] their own.   Remain active. It is stressful on the back to sit or stand in one place. Do not sit, drive, or stand in one place for more than 30 minutes at a time.  Take short walks on level surfaces as soon as pain allows.Try to increase the length of time you walk each day.  Do not stay in bed.Resting more than 1 or 2 days can delay your recovery.  Do not avoid exercise or work.Your body is made to move.It is not dangerous to be active, even though your back may hurt.Your back will likely heal faster if you return to being active before your pain is gone.  Pay attention to your body when you bend and lift. Many people have less discomfortwhen lifting if they bend their knees, keep the load close to their bodies,and avoid twisting. Often, the most comfortable positions are those that put less stress on your recovering back.  Find a comfortable position to sleep. Use a firm mattress and lie on your side with your knees slightly bent. If you lie on your back, put a pillow under your knees.  Only take over-the-counter or prescription medicines as directed by your caregiver. Over-the-counter medicines to reduce pain and inflammation are often the most helpful.Your caregiver may prescribe muscle relaxant drugs.These medicines help dull your pain so you can more quickly return to your normal activities and healthy exercise.  Put ice on the injured area.  Put ice in a plastic bag.  Place a towel between your skin and the bag.  Leave the ice on for 15-20 minutes, 03-04 times a day for the first 2 to 3 days. After  that, ice and heat may be alternated to reduce pain and spasms.  Ask your caregiver about trying back exercises and gentle massage. This may be of some benefit.  Avoid feeling anxious or stressed.Stress increases muscle tension and can worsen back pain.It is important to recognize when you are anxious or stressed and learn ways to manage it.Exercise is a great option. SEEK MEDICAL CARE IF:  You have pain that is not relieved with rest or medicine.  You have pain that does not improve in 1 week.  You have new symptoms.  You are  generally not feeling well. SEEK IMMEDIATE MEDICAL CARE IF:   You have pain that radiates from your back into your legs.  You develop new bowel or bladder control problems.  You have unusual weakness or numbness in your arms or legs.  You develop nausea or vomiting.  You develop abdominal pain.  You feel faint. Document Released: 01/06/2005 Document Revised: 07/08/2011 Document Reviewed: 05/27/2010 [facility] Patient Information 2014 [place], [place].

## 2013-05-31 NOTE — Progress Notes (Signed)
Patient ID: [name], female   DOB: 08-23-1940, 73 y.o.   MRN: [id number] [name] [id number] Feb 21, 1940 05/31/2013      Progress Note-Follow Up  Subjective  Chief Complaint  Chief Complaint  Patient presents with  . Follow-up    3 month    HPI  Patient is a 73 year old female in today for routine medical care. She is doing fairly well. She notes some ongoing back pain and leg pain but tolerates most days. Most of her back pain is in the thoracic region in March the left. Worse with movement. Also has some pain in her legs intermittently as well mostly on the tibial plateau. She has been using laxatives for some constipation off and on for the last few months. It happens she has to strain to move her bowels every 4 days but with them she gets better. Probiotics have been helpful when she takes them. No recent illness. Denies CP/palp/SOB/HA/congestion/fevers/GI or GU c/o. Taking meds as prescribed   Past Medical History  Diagnosis Date  . Asthma     PFT 02/06/09 FEV1 1.41 (65%), FVC 1.92 (64), FEV1% 74, TLC 3.47 (71%), DLCO 48%, +BD  . ACE-inhibitor cough   . DVT (deep venous thrombosis) 1987  . GERD (gastroesophageal reflux disease)   . Arthritis   . Atypical chest pain     s/p cath, Normal coronaries, Non ST elevation myocardial infarction, Rt groin pseudoaneurysm  . Hyperlipidemia   . Hypertension   . Depression   . Gout   . Abnormal glucose tolerance test   . Sun-damaged skin 10/24/2012  . Urinary frequency 10/24/2012    Past Surgical History  Procedure Laterality Date  . Appendectomy  1951  . Tubal ligation  1968  . Tonsillectomy  1950    Family History  Problem Relation Age of Onset  . Coronary artery disease Sister   . Asthma Sister   . Coronary artery disease Brother   . Coronary artery disease Other   . Coronary artery disease Brother   . Coronary artery disease Sister   . Coronary artery disease Sister   . Coronary artery disease Brother   .  Cancer Sister     uterine  . Emphysema Sister   . Emphysema Brother     History   Social History  . Marital Status: Widowed    Spouse Name: N/A    Number of Children: 1  . Years of Education: N/A   Occupational History  . COL[employer]     [employer]   Social History Main Topics  . Smoking status: Never Smoker   . Smokeless tobacco: Never Used  . Alcohol Use: No  . Drug Use: No  . Sexual Activity: Not on file   Other Topics Concern  . Not on file   Social History Narrative  . No narrative on file    Current Outpatient Prescriptions on File Prior to Visit  Medication Sig Dispense Refill  . acetaminophen (TYLENOL) 500 MG tablet Take 500 mg by mouth every 6 (six) hours as needed.      Marland Kitchen albuterol (ACCUNEB) 0.63 MG/3ML nebulizer solution USE ONE VIAL IN NEBULIZER EVERY 6 HOURS AS NEEDED  3 mL  2  . albuterol (PROAIR HFA) 108 (90 BASE) MCG/ACT inhaler Inhale 2 puffs into the lungs every 4 (four) hours as needed.  3 Inhaler  3  . alendronate (FOSAMAX) 70 MG tablet Take 1 tablet (70 mg total) by mouth every 7 (seven) days.  Take with a full glass of water on an empty stomach.  12 tablet  1  . allopurinol (ZYLOPRIM) 100 MG tablet Take 2 tablets (200 mg total) by mouth daily.  60 tablet  5  . baclofen (LIORESAL) 10 MG tablet TAKE ONE TABLET BY MOUTH THREE TIMES DAILY AS NEEDED  40 tablet  0  . budesonide-formoterol (SYMBICORT) 160-4.5 MCG/ACT inhaler Inhale 2 puffs into the lungs 2 (two) times daily.  3 Inhaler  3  . Cyanocobalamin (VITAMIN B-12 PO) Take by mouth daily.      . fluticasone (FLONASE) 50 MCG/ACT nasal spray USE TWO SPRAYS IN EACH NOSTRIL EVERY DAY as needed      . furosemide (LASIX) 40 MG tablet Take 0.5 tablets (20 mg total) by mouth every other day.  90 tablet  1  . glucose blood (ONE TOUCH TEST STRIPS) test strip Use as instructed to check blood sugar once weekly. DX 790.29       . losartan (COZAAR) 50 MG tablet Take 1 tablet (50 mg total) by mouth daily.  90 tablet   1  . montelukast (SINGULAIR) 10 MG tablet Take 1 tablet (10 mg total) by mouth at bedtime.  30 tablet  5  . Niacin (VITAMIN B-3 PO) Take 1 tablet by mouth daily.      . pantoprazole (PROTONIX) 40 MG tablet Take 1 tablet (40 mg total) by mouth daily.  90 tablet  1  . potassium chloride SA (KLOR-CON M20) 20 MEQ tablet Take 1 tablet (20 mEq total) by mouth daily.  90 tablet  1  . pravastatin (PRAVACHOL) 20 MG tablet Take 1 tablet (20 mg total) by mouth daily.  90 tablet  1  . predniSONE (DELTASONE) 10 MG tablet Take 4 tablets daily for 5 days then stop  20 tablet  0  . sertraline (ZOLOFT) 50 MG tablet Take 1 tablet (50 mg total) by mouth daily.  90 tablet  1  . tiotropium (SPIRIVA) 18 MCG inhalation capsule Place 1 capsule (18 mcg total) into inhaler and inhale daily.  90 capsule  3  . traMADol (ULTRAM) 50 MG tablet Take 1 tablet (50 mg total) by mouth every 8 (eight) hours as needed.  90 tablet  2   No current facility-administered medications on file prior to visit.    Allergies  Allergen Reactions  . Ace Inhibitors   . Indomethacin   . Montelukast Sodium     REACTION: Heart palpitations, chest pain  . Oseltamivir Phosphate     REACTION: nausea, vomiting, diarrhea, dizziness  . Sulfonamide Derivatives     Review of Systems  Review of Systems  Constitutional: Negative for fever and malaise/fatigue.  HENT: Negative for congestion.   Eyes: Negative for discharge.  Respiratory: Negative for shortness of breath.   Cardiovascular: Negative for chest pain, palpitations and leg swelling.  Gastrointestinal: Positive for constipation. Negative for nausea, abdominal pain and diarrhea.  Genitourinary: Negative for dysuria.  Musculoskeletal: Negative for falls.  Skin: Negative for rash.  Neurological: Negative for loss of consciousness and headaches.  Endo/Heme/Allergies: Negative for polydipsia.  Psychiatric/Behavioral: Negative for depression and suicidal ideas. The patient is not  nervous/anxious and does not have insomnia.     Objective  BP 122/90  Pulse 92  Temp(Src) 98.1 F (36.7 C) (Oral)  Ht 5\' 5"  (1.651 m)  Wt 224 lb 0.6 oz (101.624 kg)  BMI 37.28 kg/m2  SpO2 92%  Physical Exam  Physical Exam  Constitutional: She is oriented to person,  place, and time and well-developed, well-nourished, and in no distress. No distress.  HENT:  Head: Normocephalic and atraumatic.  Eyes: Conjunctivae are normal.  Neck: Neck supple. No thyromegaly present.  Cardiovascular: Normal rate, regular rhythm and normal heart sounds.   No murmur heard. Pulmonary/Chest: Effort normal and breath sounds normal. She has no wheezes.  Abdominal: She exhibits no distension and no mass.  Musculoskeletal: She exhibits no edema.  Lymphadenopathy:    She has no cervical adenopathy.  Neurological: She is alert and oriented to person, place, and time.  Skin: Skin is warm and dry. No rash noted. She is not diaphoretic.  Psychiatric: Memory, affect and judgment normal.    Lab Results  Component Value Date   TSH 1.956 05/27/2013   Lab Results  Component Value Date   WBC 6.8 05/27/2013   HGB 12.1 05/27/2013   HCT 35.9* 05/27/2013   MCV 90.0 05/27/2013   PLT 159 05/27/2013   Lab Results  Component Value Date   CREATININE 1.20* 05/27/2013   BUN 26* 05/27/2013   NA 137 05/27/2013   K 4.5 05/27/2013   CL 102 05/27/2013   CO2 25 05/27/2013   Lab Results  Component Value Date   ALT 13 05/27/2013   AST 15 05/27/2013   ALKPHOS 49 05/27/2013   BILITOT 0.5 05/27/2013   Lab Results  Component Value Date   CHOL 140 05/27/2013   Lab Results  Component Value Date   HDL 47 05/27/2013   Lab Results  Component Value Date   LDLCALC 64 05/27/2013   Lab Results  Component Value Date   TRIG 145 05/27/2013   Lab Results  Component Value Date   CHOLHDL 3.0 05/27/2013     Assessment & Plan  HYPERTENSION Well controlled, no changes to meds. Encouraged heart healthy diet such as the DASH diet and exercise as  tolerated.   HYPERLIPIDEMIA Tolerating statin, encouraged heart healthy diet, avoid trans fats, minimize simple carbs and saturated fats. Increase exercise as tolerated  DIABETES MELLITUS, TYPE II, BORDERLINE hgba1c acceptable, minimize simple carbs. Increase exercise as tolerated. Continue current meds  Gout, unspecified Asymptomatic on current meds, no changes  Unspecified constipation Good response to Digestive Advantage, maintain adequate fiber and fluids.   ALLERGIC RHINITIS Well controlled.   Back pain Left thoracic region, Encouraged moist heat and gentle stretching as tolerated. May try NSAIDs and prescription meds as directed and report if symptoms worsen or seek immediate care

## 2013-05-31 NOTE — Progress Notes (Signed)
Pre visit review using our clinic review tool, if applicable. No additional management support is needed unless otherwise documented below in the visit note. 

## 2013-06-05 ENCOUNTER — Encounter: Payer: Self-pay | Admitting: Family Medicine

## 2013-06-05 DIAGNOSIS — K5900 Constipation, unspecified: Secondary | ICD-10-CM

## 2013-06-05 HISTORY — DX: Constipation, unspecified: K59.00

## 2013-06-05 NOTE — Assessment & Plan Note (Signed)
Well controlled, no changes to meds. Encouraged heart healthy diet such as the DASH diet and exercise as tolerated.  °

## 2013-06-05 NOTE — Assessment & Plan Note (Signed)
Asymptomatic on current meds, no changes

## 2013-06-05 NOTE — Assessment & Plan Note (Signed)
hgba1c acceptable, minimize simple carbs. Increase exercise as tolerated. Continue current meds 

## 2013-06-05 NOTE — Assessment & Plan Note (Signed)
Well controlled 

## 2013-06-05 NOTE — Assessment & Plan Note (Signed)
Good response to Digestive Advantage, maintain adequate fiber and fluids.

## 2013-06-05 NOTE — Assessment & Plan Note (Signed)
Left thoracic region, Encouraged moist heat and gentle stretching as tolerated. May try NSAIDs and prescription meds as directed and report if symptoms worsen or seek immediate care

## 2013-06-05 NOTE — Assessment & Plan Note (Signed)
Tolerating statin, encouraged heart healthy diet, avoid trans fats, minimize simple carbs and saturated fats. Increase exercise as tolerated 

## 2013-06-16 ENCOUNTER — Ambulatory Visit (INDEPENDENT_AMBULATORY_CARE_PROVIDER_SITE_OTHER): Payer: Medicare HMO | Admitting: Critical Care Medicine

## 2013-06-16 ENCOUNTER — Encounter: Payer: Self-pay | Admitting: Critical Care Medicine

## 2013-06-16 VITALS — BP 116/62 | HR 85 | Temp 98.3°F | Ht 65.0 in | Wt 224.0 lb

## 2013-06-16 DIAGNOSIS — J4540 Moderate persistent asthma, uncomplicated: Secondary | ICD-10-CM

## 2013-06-16 DIAGNOSIS — J45909 Unspecified asthma, uncomplicated: Secondary | ICD-10-CM

## 2013-06-16 NOTE — Progress Notes (Signed)
Subjective:    Patient ID: [name], female    DOB: [birth year], 73 y.o.   MRN: [id number]  HPI Comments: Hx of dyspnea, prior Dx asthma x 67yrs.  Pt with recent URI, ear issues. Rx ABX per pcp. Since off is worse Pt with chronic asthma and bronchitis.  Notes productive cough, no real chest pain. Notes prior pna. Never smoker  06/16/2013 Chief Complaint  Patient presents with  . 2 month follow up    Has SOB every morning and with exertion - this is unchanged.  Has wheezing and coughing a little with clear to yellow mucus.   Symbicort still helps, had recent URI and gland sore.  Min cough in AM, mucus yellow.  No real chest pain No qhs symptoms.   On med for GERD, no recent flare. PFTs reviewed: mild dlco reduction, mild restriction. Mild obstruction.  BD response.  CXR no pulm fibrosis. Echo in 2011: mild diast dysf, no pah Pt denies any significant sore throat, nasal congestion or excess secretions, fever, chills, sweats, unintended weight loss, pleurtic or exertional chest pain, orthopnea PND, or leg swelling Pt denies any increase in rescue therapy over baseline, denies waking up needing it or having any early am or nocturnal exacerbations of coughing/wheezing/or dyspnea. Pt also denies any obvious fluctuation in symptoms with  weather or environmental change or other alleviating or aggravating factors  PUL ASTHMA HISTORY 06/17/2013  Symptoms >2 days/week  Nighttime awakenings 0-2/month  Interference with activity Minor limitations  SABA use 0-2 days/wk  Exacerbations requiring oral steroids 0-1 / year         Review of Systems  Constitutional: Positive for fatigue. Negative for chills, diaphoresis, activity change, appetite change and unexpected weight change.  HENT: Positive for congestion, nosebleeds, postnasal drip, sinus pressure, sneezing, tinnitus and voice change. Negative for dental problem, ear discharge, facial swelling, hearing loss, mouth sores and trouble  swallowing.   Eyes: Positive for itching. Negative for photophobia, discharge and visual disturbance.  Respiratory: Positive for chest tightness. Negative for apnea, cough, choking and stridor.   Cardiovascular: Positive for palpitations.  Gastrointestinal: Negative for nausea, constipation, blood in stool and abdominal distention.       Notes GERD symptoms  Genitourinary: Negative for dysuria, urgency, frequency, hematuria, flank pain, decreased urine volume and difficulty urinating.  Musculoskeletal: Positive for back pain and joint swelling. Negative for arthralgias, gait problem, myalgias and neck stiffness.  Skin: Negative for color change and pallor.  Neurological: Positive for dizziness and weakness. Negative for tremors, seizures, syncope, speech difficulty, light-headedness and numbness.  Hematological: Negative for adenopathy. Bruises/bleeds easily.  Psychiatric/Behavioral: Negative for confusion, sleep disturbance and agitation. The patient is not nervous/anxious.        Objective:   Physical Exam  Filed Vitals:   06/16/13 1131  BP: 116/62  Pulse: 85  Temp: 98.3 F (36.8 C)  TempSrc: Oral  Height: 5\' 5"  (1.651 m)  Weight: 224 lb (101.606 kg)  SpO2: 93%    Gen: Pleasant, well-nourished, in no distress,  normal affect  ENT: No lesions,  mouth clear,  oropharynx clear, no postnasal drip  Neck: No JVD, no TMG, no carotid bruits  Lungs: No use of accessory muscles, no dullness to percussion, distant breath sounds  Cardiovascular: RRR, heart sounds normal, no murmur or gallops, no peripheral edema  Abdomen: soft and NT, no HSM,  BS normal  Musculoskeletal: No deformities, no cyanosis or clubbing  Neuro: alert, non focal  Skin: Warm, no  lesions or rashes  No results found.        Assessment & Plan:   Asthma, moderate persistent Moderate persistent asthma stable at this time Plan Maintain Symbicort and Spiriva    Updated Medication List Outpatient  Encounter Prescriptions as of 06/16/2013  Medication Sig  . acetaminophen (TYLENOL) 500 MG tablet Take 500 mg by mouth every 6 (six) hours as needed.  Marland Kitchen albuterol (ACCUNEB) 0.63 MG/3ML nebulizer solution USE ONE VIAL IN NEBULIZER EVERY 6 HOURS AS NEEDED  . albuterol (PROAIR HFA) 108 (90 BASE) MCG/ACT inhaler Inhale 2 puffs into the lungs every 4 (four) hours as needed.  Marland Kitchen alendronate (FOSAMAX) 70 MG tablet Take 1 tablet (70 mg total) by mouth every 7 (seven) days. Take with a full glass of water on an empty stomach.  Marland Kitchen allopurinol (ZYLOPRIM) 100 MG tablet Take 2 tablets (200 mg total) by mouth daily.  . baclofen (LIORESAL) 10 MG tablet TAKE ONE TABLET BY MOUTH THREE TIMES DAILY AS NEEDED  . budesonide-formoterol (SYMBICORT) 160-4.5 MCG/ACT inhaler Inhale 2 puffs into the lungs 2 (two) times daily.  . Cyanocobalamin (VITAMIN B-12 PO) Take by mouth daily.  . furosemide (LASIX) 40 MG tablet Take 0.5 tablets (20 mg total) by mouth every other day.  Marland Kitchen glucose blood (ONE TOUCH TEST STRIPS) test strip Use as instructed to check blood sugar once weekly. DX 790.29   . losartan (COZAAR) 50 MG tablet Take 1 tablet (50 mg total) by mouth daily.  . Niacin (VITAMIN B-3 PO) Take 1 tablet by mouth daily.  . pantoprazole (PROTONIX) 40 MG tablet Take 1 tablet (40 mg total) by mouth daily.  . potassium chloride SA (KLOR-CON M20) 20 MEQ tablet Take 1 tablet (20 mEq total) by mouth daily.  . pravastatin (PRAVACHOL) 20 MG tablet Take 1 tablet (20 mg total) by mouth daily.  . sertraline (ZOLOFT) 50 MG tablet Take 1 tablet (50 mg total) by mouth daily.  Marland Kitchen tiotropium (SPIRIVA) 18 MCG inhalation capsule Place 1 capsule (18 mcg total) into inhaler and inhale daily.  . traMADol (ULTRAM) 50 MG tablet Take 1 tablet (50 mg total) by mouth every 8 (eight) hours as needed.  . fluticasone (FLONASE) 50 MCG/ACT nasal spray USE TWO SPRAYS IN EACH NOSTRIL EVERY DAY as needed  . montelukast (SINGULAIR) 10 MG tablet Take 1 tablet (10  mg total) by mouth at bedtime.  . [DISCONTINUED] predniSONE (DELTASONE) 10 MG tablet Take 4 tablets daily for 5 days then stop

## 2013-06-16 NOTE — Patient Instructions (Signed)
No change in medications. Return in         4 months 

## 2013-06-17 NOTE — Assessment & Plan Note (Signed)
Moderate persistent asthma stable at this time Plan Maintain Symbicort and Spiriva

## 2013-07-11 ENCOUNTER — Other Ambulatory Visit: Payer: Self-pay | Admitting: Family Medicine

## 2013-07-13 NOTE — Telephone Encounter (Signed)
Refill request for tramadol Last filled by MD on- 02/16/2013 #[id number] x2 Last Appt: 05/31/2013 Next Appt:11/28/2013 Please advise refill?

## 2013-07-13 NOTE — Telephone Encounter (Signed)
Rx request faxed to pharmacy/[facility]  

## 2013-07-18 ENCOUNTER — Other Ambulatory Visit: Payer: Self-pay | Admitting: Family Medicine

## 2013-07-18 MED ORDER — BUDESONIDE-FORMOTEROL FUMARATE 160-4.5 MCG/ACT IN AERO: 2.0000 | INHALATION_SPRAY | Freq: Two times a day (BID) | RESPIRATORY_TRACT | Status: DC

## 2013-07-18 NOTE — Telephone Encounter (Signed)
Please advise Baclofen RX?

## 2013-08-11 ENCOUNTER — Other Ambulatory Visit: Payer: Self-pay | Admitting: Family Medicine

## 2013-08-23 ENCOUNTER — Ambulatory Visit (HOSPITAL_BASED_OUTPATIENT_CLINIC_OR_DEPARTMENT_OTHER)
Admission: RE | Admit: 2013-08-23 | Discharge: 2013-08-23 | Disposition: A | Discharge status: Home / Self Care | Payer: Medicare PPO | Source: Ambulatory Visit | Attending: Family Medicine | Admitting: Family Medicine

## 2013-08-23 ENCOUNTER — Ambulatory Visit (INDEPENDENT_AMBULATORY_CARE_PROVIDER_SITE_OTHER): Payer: Medicare PPO | Admitting: Family Medicine

## 2013-08-23 ENCOUNTER — Other Ambulatory Visit: Payer: Self-pay | Admitting: Family Medicine

## 2013-08-23 VITALS — BP 122/80 | HR 95 | Temp 98.8°F | Ht 65.0 in | Wt 220.0 lb

## 2013-08-23 DIAGNOSIS — K219 Gastro-esophageal reflux disease without esophagitis: Secondary | ICD-10-CM

## 2013-08-23 DIAGNOSIS — I10 Essential (primary) hypertension: Secondary | ICD-10-CM

## 2013-08-23 DIAGNOSIS — R194 Change in bowel habit: Secondary | ICD-10-CM

## 2013-08-23 DIAGNOSIS — M5430 Sciatica, unspecified side: Secondary | ICD-10-CM

## 2013-08-23 DIAGNOSIS — F3289 Other specified depressive episodes: Secondary | ICD-10-CM

## 2013-08-23 DIAGNOSIS — M545 Low back pain, unspecified: Secondary | ICD-10-CM

## 2013-08-23 DIAGNOSIS — M5442 Lumbago with sciatica, left side: Secondary | ICD-10-CM

## 2013-08-23 DIAGNOSIS — N390 Urinary tract infection, site not specified: Secondary | ICD-10-CM

## 2013-08-23 DIAGNOSIS — R7309 Other abnormal glucose: Secondary | ICD-10-CM

## 2013-08-23 DIAGNOSIS — R1013 Epigastric pain: Secondary | ICD-10-CM

## 2013-08-23 DIAGNOSIS — D649 Anemia, unspecified: Secondary | ICD-10-CM

## 2013-08-23 DIAGNOSIS — F329 Major depressive disorder, single episode, unspecified: Secondary | ICD-10-CM

## 2013-08-23 DIAGNOSIS — E785 Hyperlipidemia, unspecified: Secondary | ICD-10-CM

## 2013-08-23 DIAGNOSIS — F32A Depression, unspecified: Secondary | ICD-10-CM

## 2013-08-23 DIAGNOSIS — R198 Other specified symptoms and signs involving the digestive system and abdomen: Secondary | ICD-10-CM

## 2013-08-23 LAB — POCT URINALYSIS DIPSTICK
Bilirubin, UA: NEGATIVE
Blood, UA: NEGATIVE
GLUCOSE UA: NEGATIVE
KETONES UA: NEGATIVE
Leukocytes, UA: NEGATIVE
Nitrite, UA: NEGATIVE
Protein, UA: NEGATIVE
SPEC GRAV UA: 1.01
Urobilinogen, UA: NEGATIVE
pH, UA: 5

## 2013-08-23 LAB — CBC
HEMATOCRIT: 34.6 % — AB (ref 36.0–46.0)
HEMOGLOBIN: 11.6 g/dL — AB (ref 12.0–15.0)
MCH: 29.6 pg (ref 26.0–34.0)
MCHC: 33.5 g/dL (ref 30.0–36.0)
MCV: 88.3 fL (ref 78.0–100.0)
Platelets: 181 10*3/uL (ref 150–400)
RBC: 3.92 MIL/uL (ref 3.87–5.11)
RDW: 14.1 % (ref 11.5–15.5)
WBC: 6.9 10*3/uL (ref 4.0–10.5)

## 2013-08-23 LAB — RENAL FUNCTION PANEL
Albumin: 4.1 g/dL (ref 3.5–5.2)
BUN: 22 mg/dL (ref 6–23)
CHLORIDE: 103 meq/L (ref 96–112)
CO2: 26 mEq/L (ref 19–32)
Calcium: 9.4 mg/dL (ref 8.4–10.5)
Creat: 1.36 mg/dL — ABNORMAL HIGH (ref 0.50–1.10)
GLUCOSE: 110 mg/dL — AB (ref 70–99)
POTASSIUM: 4.5 meq/L (ref 3.5–5.3)
Phosphorus: 3.5 mg/dL (ref 2.3–4.6)
Sodium: 140 mEq/L (ref 135–145)

## 2013-08-23 MED ORDER — PRAVASTATIN SODIUM 20 MG PO TABS: 20.0000 mg | ORAL_TABLET | Freq: Every day | ORAL | Status: DC

## 2013-08-23 MED ORDER — LOSARTAN POTASSIUM 50 MG PO TABS: 50.0000 mg | ORAL_TABLET | Freq: Every day | ORAL | Status: DC

## 2013-08-23 MED ORDER — SERTRALINE HCL 50 MG PO TABS: 50.0000 mg | ORAL_TABLET | Freq: Every day | ORAL | Status: DC

## 2013-08-23 MED ORDER — PANTOPRAZOLE SODIUM 40 MG PO TBEC: 40.0000 mg | DELAYED_RELEASE_TABLET | Freq: Every day | ORAL | Status: DC

## 2013-08-23 MED ORDER — TRAMADOL HCL 50 MG PO TABS
50.0000 mg | ORAL_TABLET | Freq: Three times a day (TID) | ORAL | Status: DC | PRN
Start: 2013-08-23 — End: 2013-10-10

## 2013-08-23 MED ORDER — BACLOFEN 10 MG PO TABS: 10.0000 mg | ORAL_TABLET | Freq: Three times a day (TID) | ORAL | Status: DC | PRN

## 2013-08-23 MED ORDER — RANITIDINE HCL 300 MG PO TABS: 300.0000 mg | ORAL_TABLET | Freq: Every day | ORAL | Status: DC

## 2013-08-23 NOTE — Patient Instructions (Addendum)
Jobst stockings, light weight 10-20 knee high on in off in pm Feet above heart     Gastroesophageal Reflux Disease, Adult Gastroesophageal reflux disease (GERD) happens when acid from your stomach flows up into the esophagus. When acid comes in contact with the esophagus, the acid causes soreness (inflammation) in the esophagus. Over time, GERD may create small holes (ulcers) in the lining of the esophagus. CAUSES   Increased body weight. This puts pressure on the stomach, making acid rise from the stomach into the esophagus.  Smoking. This increases acid production in the stomach.  Drinking alcohol. This causes decreased pressure in the lower esophageal sphincter (valve or ring of muscle between the esophagus and stomach), allowing acid from the stomach into the esophagus.  Late evening meals and a full stomach. This increases pressure and acid production in the stomach.  A malformed lower esophageal sphincter. Sometimes, no cause is found. SYMPTOMS   Burning pain in the lower part of the mid-chest behind the breastbone and in the mid-stomach area. This may occur twice a week or more often.  Trouble swallowing.  Sore throat.  Dry cough.  Asthma-like symptoms including chest tightness, shortness of breath, or wheezing. DIAGNOSIS  Your caregiver may be able to diagnose GERD based on your symptoms. In some cases, X-rays and other tests may be done to check for complications or to check the condition of your stomach and esophagus. TREATMENT  Your caregiver may recommend over-the-counter or prescription medicines to help decrease acid production. Ask your caregiver before starting or adding any new medicines.  HOME CARE INSTRUCTIONS   Change the factors that you can control. Ask your caregiver for guidance concerning weight loss, quitting smoking, and alcohol consumption.  Avoid foods and drinks that make your symptoms worse, such as:  Caffeine or alcoholic  drinks.  Chocolate.  Peppermint or mint flavorings.  Garlic and onions.  Spicy foods.  Citrus fruits, such as oranges, lemons, or limes.  Tomato-based foods such as sauce, chili, salsa, and pizza.  Fried and fatty foods.  Avoid lying down for the 3 hours prior to your bedtime or prior to taking a nap.  Eat small, frequent meals instead of large meals.  Wear loose-fitting clothing. Do not wear anything tight around your waist that causes pressure on your stomach.  Raise the head of your bed 6 to 8 inches with wood blocks to help you sleep. Extra pillows will not help.  Only take over-the-counter or prescription medicines for pain, discomfort, or fever as directed by your caregiver.  Do not take aspirin, ibuprofen, or other nonsteroidal anti-inflammatory drugs (NSAIDs). SEEK IMMEDIATE MEDICAL CARE IF:   You have pain in your arms, neck, jaw, teeth, or back.  Your pain increases or changes in intensity or duration.  You develop nausea, vomiting, or sweating (diaphoresis).  You develop shortness of breath, or you faint.  Your vomit is green, yellow, black, or looks like coffee grounds or blood.  Your stool is red, bloody, or black. These symptoms could be signs of other problems, such as heart disease, gastric bleeding, or esophageal bleeding. MAKE SURE YOU:   Understand these instructions.  Will watch your condition.  Will get help right away if you are not doing well or get worse. Document Released: 10/16/2004 Document Revised: 03/31/2011 Document Reviewed: 07/26/2010 [facility] Patient Information 2015 [place], [place]. This information is not intended to replace advice given to you by your health care provider. Make sure you discuss any questions you have with your  health care provider.  

## 2013-08-23 NOTE — Assessment & Plan Note (Signed)
Well controlled, no changes to meds. Encouraged heart healthy diet such as the DASH diet and exercise as tolerated.  °

## 2013-08-23 NOTE — Progress Notes (Signed)
Pre visit review using our clinic review tool, if applicable. No additional management support is needed unless otherwise documented below in the visit note. 

## 2013-08-23 NOTE — Assessment & Plan Note (Addendum)
Avoid offending foods, start probiotics. Do not eat large meals in late evening and consider raising head of bed. Worsening over past several weeks. . Increasing chest burning. H Pylori is negative,  referred back to gastroenterology for further consideration

## 2013-08-24 LAB — H. PYLORI ANTIBODY, IGG

## 2013-08-26 ENCOUNTER — Encounter: Payer: Self-pay | Admitting: Physician Assistant

## 2013-08-26 ENCOUNTER — Other Ambulatory Visit: Payer: Self-pay

## 2013-08-26 DIAGNOSIS — D649 Anemia, unspecified: Secondary | ICD-10-CM

## 2013-08-27 LAB — CULTURE, URINE COMPREHENSIVE: Colony Count: 2000

## 2013-08-28 ENCOUNTER — Encounter: Payer: Self-pay | Admitting: Family Medicine

## 2013-08-28 NOTE — Assessment & Plan Note (Signed)
With left sided radiculopathy. Degenerative changes noted on xray, encouraged Tylenol prn and pain patches as needed.prescribed Baclofen to use prn. Given Baclofen to use sparingly.

## 2013-08-28 NOTE — Assessment & Plan Note (Signed)
Klebsiella UTI, started on Ciprofloxacin bid and probiotics

## 2013-08-28 NOTE — Assessment & Plan Note (Signed)
Tolerating statin, encouraged heart healthy diet, avoid trans fats, minimize simple carbs and saturated fats. Increase exercise as tolerated 

## 2013-08-28 NOTE — Assessment & Plan Note (Signed)
hgba1c acceptable, minimize simple carbs. Increase exercise as tolerated. Continue current meds 

## 2013-08-28 NOTE — Progress Notes (Signed)
Patient ID: STORY MUSSELL, female   DOB: 1940-05-25, 73 y.o.   MRN: [id number] ALYESKA BOYETTE [id number] September 10, 1940 08/28/2013      Progress Note-Follow Up  Subjective  Chief Complaint  Chief Complaint  Patient presents with  . Back Pain    lower left side X 1 month- no burning or urine frequency, abd pain sometimes on left side  . Dizziness    X 2 weeks  . Headache    X 2 weeks    HPI  Patient is a 73 year old female in today for routine medical care. Struggling with increased low back pain for about the last month. Describes some numbness to left foot at times. Improves with sitting. Sometimes numbness significant enough to cause near falling is associated with low back pain. Is also c/o LLQ pain intermittently. Finally c/o lightheaded feeling with mild HA intermittently for last 2 weeks. No f/c/other neurologic c/o. Denies CP/palp/SOB/HA/congestion/GI c/o. Taking meds as prescribed Past Medical History  Diagnosis Date  . Asthma     PFT 02/06/09 FEV1 1.41 (65%), FVC 1.92 (64), FEV1% 74, TLC 3.47 (71%), DLCO 48%, +BD  . ACE-inhibitor cough   . DVT (deep venous thrombosis) 1987  . GERD (gastroesophageal reflux disease)   . Arthritis   . Atypical chest pain     s/p cath, Normal coronaries, Non ST elevation myocardial infarction, Rt groin pseudoaneurysm  . Hyperlipidemia   . Hypertension   . Depression   . Gout   . Abnormal glucose tolerance test   . Sun-damaged skin 10/24/2012  . Urinary frequency 10/24/2012  . Unspecified constipation 06/05/2013  . UTI (lower urinary tract infection) 10/24/2012    Past Surgical History  Procedure Laterality Date  . Appendectomy  1951  . Tubal ligation  1968  . Tonsillectomy  1950    Family History  Problem Relation Age of Onset  . Coronary artery disease Sister   . Asthma Sister   . Coronary artery disease Brother   . Coronary artery disease Other   . Coronary artery disease Brother   . Coronary artery disease Sister   . Coronary  artery disease Sister   . Coronary artery disease Brother   . Cancer Sister     uterine  . Emphysema Sister   . Emphysema Brother     History   Social History  . Marital Status: Widowed    Spouse Name: N/A    Number of Children: 1  . Years of Education: N/A   Occupational History  . [employer]     [employer]   Social History Main Topics  . Smoking status: Never Smoker   . Smokeless tobacco: Never Used  . Alcohol Use: No  . Drug Use: No  . Sexual Activity: Not on file   Other Topics Concern  . Not on file   Social History Narrative  . No narrative on file    Current Outpatient Prescriptions on File Prior to Visit  Medication Sig Dispense Refill  . acetaminophen (TYLENOL) 500 MG tablet Take 500 mg by mouth every 6 (six) hours as needed.      Marland Kitchen albuterol (ACCUNEB) 0.63 MG/3ML nebulizer solution USE ONE VIAL IN NEBULIZER EVERY 6 HOURS AS NEEDED  3 mL  2  . albuterol (PROAIR HFA) 108 (90 BASE) MCG/ACT inhaler Inhale 2 puffs into the lungs every 4 (four) hours as needed.  3 Inhaler  3  . alendronate (FOSAMAX) 70 MG tablet Take 1 tablet (70 mg total) by  mouth every 7 (seven) days. Take with a full glass of water on an empty stomach.  12 tablet  1  . allopurinol (ZYLOPRIM) 100 MG tablet Take 2 tablets (200 mg total) by mouth daily.  60 tablet  5  . budesonide-formoterol (SYMBICORT) 160-4.5 MCG/ACT inhaler Inhale 2 puffs into the lungs 2 (two) times daily.  3 Inhaler  1  . Cyanocobalamin (VITAMIN B-12 PO) Take by mouth daily.      . furosemide (LASIX) 40 MG tablet Take 0.5 tablets (20 mg total) by mouth every other day.  90 tablet  1  . glucose blood (ONE TOUCH TEST STRIPS) test strip Use as instructed to check blood sugar once weekly. DX 790.29       . montelukast (SINGULAIR) 10 MG tablet Take 1 tablet (10 mg total) by mouth at bedtime.  30 tablet  5  . Niacin (VITAMIN B-3 PO) Take 1 tablet by mouth daily.      . potassium chloride SA (KLOR-CON M20) 20 MEQ tablet Take 1 tablet  (20 mEq total) by mouth daily.  90 tablet  1  . tiotropium (SPIRIVA) 18 MCG inhalation capsule Place 1 capsule (18 mcg total) into inhaler and inhale daily.  90 capsule  3   No current facility-administered medications on file prior to visit.    Allergies  Allergen Reactions  . Ace Inhibitors   . Indomethacin   . Montelukast Sodium     REACTION: Heart palpitations, chest pain  . Oseltamivir Phosphate     REACTION: nausea, vomiting, diarrhea, dizziness  . Sulfonamide Derivatives     Review of Systems  Review of Systems  Constitutional: Negative for fever and malaise/fatigue.  HENT: Negative for congestion.   Eyes: Negative for discharge.  Respiratory: Negative for shortness of breath.   Cardiovascular: Negative for chest pain, palpitations and leg swelling.  Gastrointestinal: Negative for nausea, abdominal pain and diarrhea.  Genitourinary: Positive for frequency.  Musculoskeletal: Positive for back pain and myalgias. Negative for falls.  Skin: Negative for rash.  Neurological: Positive for sensory change. Negative for loss of consciousness and headaches.       Describes some numbness to left foot at times. Improves with sitting. Sometimes numbness significant enough to cause near falling  Endo/Heme/Allergies: Negative for polydipsia.  Psychiatric/Behavioral: Negative for depression and suicidal ideas. The patient is not nervous/anxious and does not have insomnia.     Objective  BP 122/80  Pulse 95  Temp(Src) 98.8 F (37.1 C) (Oral)  Ht 5\' 5"  (1.651 m)  Wt 220 lb 0.6 oz (99.809 kg)  BMI 36.62 kg/m2  SpO2 97%  Physical Exam  Physical Exam  Constitutional: She is oriented to person, place, and time and well-developed, well-nourished, and in no distress. No distress.  HENT:  Head: Normocephalic and atraumatic.  Eyes: Conjunctivae are normal.  Neck: Neck supple. No thyromegaly present.  Cardiovascular: Normal rate, regular rhythm and normal heart sounds.   No murmur  heard. Pulmonary/Chest: Effort normal and breath sounds normal. She has no wheezes.  Abdominal: She exhibits no distension and no mass.  Musculoskeletal: She exhibits no edema.  Lymphadenopathy:    She has no cervical adenopathy.  Neurological: She is alert and oriented to person, place, and time.  Skin: Skin is warm and dry. No rash noted. She is not diaphoretic.  Psychiatric: Memory, affect and judgment normal.    Lab Results  Component Value Date   TSH 1.956 05/27/2013   Lab Results  Component Value Date  WBC 6.9 08/23/2013   HGB 11.6* 08/23/2013   HCT 34.6* 08/23/2013   MCV 88.3 08/23/2013   PLT 181 08/23/2013   Lab Results  Component Value Date   CREATININE 1.36* 08/23/2013   BUN 22 08/23/2013   NA 140 08/23/2013   K 4.5 08/23/2013   CL 103 08/23/2013   CO2 26 08/23/2013   Lab Results  Component Value Date   ALT 13 05/27/2013   AST 15 05/27/2013   ALKPHOS 49 05/27/2013   BILITOT 0.5 05/27/2013   Lab Results  Component Value Date   CHOL 140 05/27/2013   Lab Results  Component Value Date   HDL 47 05/27/2013   Lab Results  Component Value Date   LDLCALC 64 05/27/2013   Lab Results  Component Value Date   TRIG 145 05/27/2013   Lab Results  Component Value Date   CHOLHDL 3.0 05/27/2013     Assessment & Plan  HYPERTENSION Well controlled, no changes to meds. Encouraged heart healthy diet such as the DASH diet and exercise as tolerated.   GERD Avoid offending foods, start probiotics. Do not eat large meals in late evening and consider raising head of bed. Worsening over past several weeks. . Increasing chest burning. H Pylori is negative,  referred back to gastroenterology for further consideration  DIABETES MELLITUS, TYPE II, BORDERLINE hgba1c acceptable, minimize simple carbs. Increase exercise as tolerated. Continue current meds  HYPERLIPIDEMIA Tolerating statin, encouraged heart healthy diet, avoid trans fats, minimize simple carbs and saturated fats. Increase exercise as  tolerated  UTI (lower urinary tract infection) Klebsiella UTI, started on Ciprofloxacin bid and probiotics  Back pain With left sided radiculopathy. Degenerative changes noted on xray, encouraged Tylenol prn and pain patches as needed.prescribed Baclofen to use prn. Given Baclofen to use sparingly.

## 2013-08-29 MED ORDER — CIPROFLOXACIN HCL 250 MG PO TABS
250.0000 mg | ORAL_TABLET | Freq: Two times a day (BID) | ORAL | Status: DC
Start: 2013-08-29 — End: 2013-09-12

## 2013-08-29 NOTE — Addendum Note (Signed)
Addended by: Varney Daily on: 08/29/2013 07:39 AM   Modules accepted: Orders

## 2013-09-12 ENCOUNTER — Other Ambulatory Visit (INDEPENDENT_AMBULATORY_CARE_PROVIDER_SITE_OTHER): Payer: Commercial Managed Care - HMO

## 2013-09-12 ENCOUNTER — Ambulatory Visit (INDEPENDENT_AMBULATORY_CARE_PROVIDER_SITE_OTHER): Payer: Commercial Managed Care - HMO | Admitting: Physician Assistant

## 2013-09-12 ENCOUNTER — Encounter: Payer: Self-pay | Admitting: Physician Assistant

## 2013-09-12 VITALS — BP 146/78 | HR 84 | Ht 63.5 in | Wt 219.0 lb

## 2013-09-12 DIAGNOSIS — R194 Change in bowel habit: Secondary | ICD-10-CM

## 2013-09-12 DIAGNOSIS — R1032 Left lower quadrant pain: Secondary | ICD-10-CM

## 2013-09-12 DIAGNOSIS — R1012 Left upper quadrant pain: Secondary | ICD-10-CM

## 2013-09-12 DIAGNOSIS — R198 Other specified symptoms and signs involving the digestive system and abdomen: Secondary | ICD-10-CM

## 2013-09-12 DIAGNOSIS — K5900 Constipation, unspecified: Secondary | ICD-10-CM

## 2013-09-12 LAB — IBC PANEL
Iron: 60 ug/dL (ref 42–145)
Saturation Ratios: 15.4 % — ABNORMAL LOW (ref 20.0–50.0)
TRANSFERRIN: 277.8 mg/dL (ref 212.0–360.0)

## 2013-09-12 LAB — FERRITIN: Ferritin: 30.6 ng/mL (ref 10.0–291.0)

## 2013-09-12 MED ORDER — POLYETHYLENE GLYCOL 3350 17 GM/SCOOP PO POWD: 1.0000 | Freq: Every day | ORAL | Status: DC

## 2013-09-12 NOTE — Patient Instructions (Addendum)
Please go to the basement level to have your labs drawn.  Complete your stool cards you have at home. Take Miralax daily, 17 grams in 8 oz of juice or water. Stop the stool softner.  You have been scheduled for a colonoscopy. Please follow written instructions given to you at your visit today.  Please pick up your prep kit at the pharmacy within the next 1-3 days. We sent a prescription to [facility] for the generic Miralax for the colonscopy prep. [address] Your physician has requested that you go to www.[email or web address].com and enter the access code given to you at your visit today. This web site gives a general overview about your procedure. However, you should still follow specific instructions given to you by our office regarding your preparation for the procedure.

## 2013-09-12 NOTE — Progress Notes (Signed)
Subjective:    Patient ID: [name], female    DOB: Apr 22, 1940, 73 y.o.   MRN: [id number]  HPI  [name] is a 73 year old white female known to Dr. Delfin Edis from previous colonoscopy done for screening in 2007. This was a normal exam with no polyps or evidence of diverticulosis. She comes in today with complaints of six-month history of change in her bowel habits and constipation. Around that time she had been told by her PCP to take probiotics for general purposes and says that her symptoms started after that but she was told that this was unlikely due to the probiotics. He has been off of the probiotics over the past couple months and has not noticed any improvement. She says she is going 3-4 days between bowel movements and then having painful straining and hard stools. She is taking a stool softener regular basis and also using a Colon  Cleanse product every couple of days.She says with this she will have a bowel movement every 2-3 days but her normal was to have a bowel movement about twice daily- she says she felt better. Has not noticed any melena or hematochezia. Her appetite has been fine her weight has been stable. Over the past week or so she has also noted some left-sided abdominal pain sometimes exacerbated by movement. Seems to be a little bit better after bowel movements. Patient also mentions that she's been taking pain medication on a regular basis over the past several months for back pain. She has been on tramadol. Recent labs done 08/23/2013 show WBC of 6.9 hemoglobin 11.6 hematocrit of 34.6 MCV of 88 H. pylori antibody negative    Review of Systems  Constitutional: Negative.   HENT: Negative.   Eyes: Negative.   Respiratory: Negative.   Cardiovascular: Negative.   Gastrointestinal: Positive for abdominal pain and constipation.  Endocrine: Negative.   Genitourinary: Negative.   Musculoskeletal: Negative.   Skin: Negative.   Allergic/Immunologic: Negative.     Neurological: Negative.   Hematological: Negative.   Psychiatric/Behavioral: Negative.    Outpatient Prescriptions Prior to Visit  Medication Sig Dispense Refill  . acetaminophen (TYLENOL) 500 MG tablet Take 500 mg by mouth every 6 (six) hours as needed.      Marland Kitchen albuterol (ACCUNEB) 0.63 MG/3ML nebulizer solution USE ONE VIAL IN NEBULIZER EVERY 6 HOURS AS NEEDED  3 mL  2  . albuterol (PROAIR HFA) 108 (90 BASE) MCG/ACT inhaler Inhale 2 puffs into the lungs every 4 (four) hours as needed.  3 Inhaler  3  . alendronate (FOSAMAX) 70 MG tablet Take 1 tablet (70 mg total) by mouth every 7 (seven) days. Take with a full glass of water on an empty stomach.  12 tablet  1  . allopurinol (ZYLOPRIM) 100 MG tablet Take 2 tablets (200 mg total) by mouth daily.  60 tablet  5  . baclofen (LIORESAL) 10 MG tablet Take 1 tablet (10 mg total) by mouth 3 (three) times daily as needed for muscle spasms.  90 tablet  5  . budesonide-formoterol (SYMBICORT) 160-4.5 MCG/ACT inhaler Inhale 2 puffs into the lungs 2 (two) times daily.  3 Inhaler  1  . Cyanocobalamin (VITAMIN B-12 PO) Take by mouth daily.      . furosemide (LASIX) 40 MG tablet Take 0.5 tablets (20 mg total) by mouth every other day.  90 tablet  1  . glucose blood (ONE TOUCH TEST STRIPS) test strip Use as instructed to check blood sugar once  weekly. DX 790.29       . losartan (COZAAR) 50 MG tablet Take 1 tablet (50 mg total) by mouth daily.  90 tablet  3  . montelukast (SINGULAIR) 10 MG tablet Take 1 tablet (10 mg total) by mouth at bedtime.  30 tablet  5  . Niacin (VITAMIN B-3 PO) Take 1 tablet by mouth daily.      . pantoprazole (PROTONIX) 40 MG tablet Take 1 tablet (40 mg total) by mouth daily.  90 tablet  3  . potassium chloride SA (KLOR-CON M20) 20 MEQ tablet Take 1 tablet (20 mEq total) by mouth daily.  90 tablet  1  . pravastatin (PRAVACHOL) 20 MG tablet Take 1 tablet (20 mg total) by mouth daily.  90 tablet  3  . ranitidine (ZANTAC) 300 MG tablet Take  1 tablet (300 mg total) by mouth at bedtime.  90 tablet  1  . sertraline (ZOLOFT) 50 MG tablet Take 1 tablet (50 mg total) by mouth daily.  90 tablet  3  . tiotropium (SPIRIVA) 18 MCG inhalation capsule Place 1 capsule (18 mcg total) into inhaler and inhale daily.  90 capsule  3  . traMADol (ULTRAM) 50 MG tablet Take 1 tablet (50 mg total) by mouth 3 (three) times daily as needed for moderate pain or severe pain.  90 tablet  0  . ciprofloxacin (CIPRO) 250 MG tablet Take 1 tablet (250 mg total) by mouth 2 (two) times daily. X 5 days  10 tablet  0   No facility-administered medications prior to visit.   Allergies  Allergen Reactions  . Ace Inhibitors   . Indomethacin   . Montelukast Sodium     REACTION: Heart palpitations, chest pain  . Oseltamivir Phosphate     REACTION: nausea, vomiting, diarrhea, dizziness  . Sulfonamide Derivatives    Patient Active Problem List   Diagnosis Date Noted  . Unspecified constipation 06/05/2013  . Dyspnea 03/07/2013  . Tachycardia 02/23/2013  . Sun-damaged skin 10/24/2012  . UTI (lower urinary tract infection) 10/24/2012  . Back pain 04/07/2011  . ALLERGIC RHINITIS 05/23/2009  . Gout, unspecified 01/11/2008  . VARICOSE VEINS, LOWER EXTREMITIES 06/01/2007  . ADJ DISORDER WITH MIXED ANXIETY & DEPRESSED MOOD 03/25/2007  . HYPERLIPIDEMIA 12/29/2006  . HYPERTENSION 12/29/2006  . OSTEOPENIA 12/29/2006  . PELVIC PAIN, RIGHT 12/29/2006  . DIABETES MELLITUS, TYPE II, BORDERLINE 12/29/2006  . Asthma, moderate persistent 08/21/2006  . GERD 08/21/2006  . OSTEOARTHRITIS 08/21/2006  . DVT, HX OF 08/21/2006   History  Substance Use Topics  . Smoking status: Never Smoker   . Smokeless tobacco: Never Used  . Alcohol Use: No   family history includes Asthma in her sister; Colon polyps in her sister; Coronary artery disease in her brother, other, and sister; Emphysema in her brother and sister; Uterine cancer in her sister.     Objective:   Physical Exam   well-developed older white female in no acute distress blood pressure 146/78 pulse 84 height 5 foot 3 weight 219. HEENT; nontraumatic normocephalic EOMI PERRLA sclera anicteric, Supple no JVD, Cardiovascular; regular rate and rhythm with S1-S2 no murmur or gallop, Pulm; clear bilaterally, Abdomen; large soft she is tender minimally in the left lower quadrant there is also some mild tenderness in the right lower quadrant no guarding or rebound no palpable mass or hepatosplenomegaly bowel sounds are present, Rectal; exam not done, Extremities; no clubbing cyanosis  skin warm and dry, Psych; mood and affect appropriate  Assessment & Plan:  #58  73 year old female with several month history of change in bowel habits with new constipation, straining and now with 1-2 week history of left-sided abdominal pain. Etiology of her symptoms is not entirely clear, this may be functional constipation exacerbated by analgesics cannot rule out occult colon lesion. By exam I do not feel that she has diverticulitis. #2 asthma  #3 hypertension #4 anxiety and depression #5 normocytic anemia  Plan-we'll check iron studies to rule out iron deficiency Start MiraLax 17 g in 8 ounces of fluid daily and stop stool softener, she is advised she may take up to 2 doses of MiraLax daily as needed Schedule for colonoscopy with Dr. Olevia Perches. Procedure discussed in detail with the patient and she is agreeable to proceed She is asked to call in the interim if her left-sided abdominal pain worsens, and at that time would consider CT.   #5 osteoarthritis

## 2013-09-13 ENCOUNTER — Telehealth: Payer: Self-pay

## 2013-09-13 ENCOUNTER — Telehealth: Payer: Self-pay | Admitting: *Deleted

## 2013-09-13 NOTE — Telephone Encounter (Signed)
The patient was to be scheduled for a colon in [place] on 9-23-@ 11.  Someone was scheduling at the same time and took that spot.  I asked [name] if she would want 10-21 @ 11 Am. She said that would be okay. I explained her instructions would be the same except for the date. She understood the instructions.

## 2013-09-13 NOTE — Telephone Encounter (Signed)
Patient advised of the normal results.

## 2013-09-13 NOTE — Progress Notes (Signed)
Reviewed and agree.

## 2013-09-13 NOTE — Telephone Encounter (Signed)
Message copied by Greggory Keen on Tue Sep 13, 2013  3:45 PM ------      Message from: Nicoletta Ba S      Created: Tue Sep 13, 2013  3:11 PM       Please let pt know that iron studies are normal ------

## 2013-10-05 ENCOUNTER — Other Ambulatory Visit: Payer: Self-pay | Admitting: Family Medicine

## 2013-10-10 ENCOUNTER — Other Ambulatory Visit: Payer: Self-pay | Admitting: Family Medicine

## 2013-10-10 NOTE — Telephone Encounter (Signed)
RX for tramadol done on 08-23-13 quantity 90 with 0 refills.  rx printed for md to sign and fax

## 2013-10-27 ENCOUNTER — Encounter: Payer: Self-pay | Admitting: Internal Medicine

## 2013-11-02 ENCOUNTER — Telehealth: Payer: Self-pay | Admitting: Internal Medicine

## 2013-11-02 MED ORDER — MOVIPREP 100 G PO SOLR: 1.0000 | ORAL | Status: DC

## 2013-11-02 NOTE — Telephone Encounter (Signed)
Resent the prescription for Moviprep to [facility].

## 2013-11-03 ENCOUNTER — Telehealth: Payer: Self-pay | Admitting: Physician Assistant

## 2013-11-08 NOTE — Telephone Encounter (Signed)
Patient called front desk regarding her prep and call was transferred to me in admitting.  She states that she was originally given Miralax split prep and her appointment was changed to 11-09-13.  She had lost her original instructions and called to speak with Marisue Humble, CMA for instructions.  Pam then gave her instructions for Moviprep.  Both preps were at her pharmacy and patient thought she was to take both Miralax and Moviprep.  I advised her not to take the Miralax or Dulcolax tablets, to only take Moviprep since she had already purchased it.  I went over instructions once again for the Moviprep and patient understood.

## 2013-11-09 ENCOUNTER — Ambulatory Visit (AMBULATORY_SURGERY_CENTER): Payer: Commercial Managed Care - HMO | Admitting: Internal Medicine

## 2013-11-09 ENCOUNTER — Encounter: Payer: Self-pay | Admitting: Internal Medicine

## 2013-11-09 VITALS — BP 152/75 | HR 84 | Temp 98.6°F | Resp 18 | Ht 63.0 in | Wt 219.0 lb

## 2013-11-09 DIAGNOSIS — R194 Change in bowel habit: Secondary | ICD-10-CM

## 2013-11-09 DIAGNOSIS — D124 Benign neoplasm of descending colon: Secondary | ICD-10-CM

## 2013-11-09 DIAGNOSIS — K635 Polyp of colon: Secondary | ICD-10-CM

## 2013-11-09 DIAGNOSIS — D122 Benign neoplasm of ascending colon: Secondary | ICD-10-CM

## 2013-11-09 MED ORDER — SODIUM CHLORIDE 0.9 % IV SOLN: 500.0000 mL | INTRAVENOUS | Status: DC

## 2013-11-09 NOTE — Progress Notes (Signed)
Called to room to assist during endoscopic procedure.  Patient ID and intended procedure confirmed with present staff. Received instructions for my participation in the procedure from the performing physician.  

## 2013-11-09 NOTE — Progress Notes (Signed)
Report to PACU, RN, vss, BBS= Clear.  

## 2013-11-09 NOTE — Patient Instructions (Signed)
YOU HAD AN ENDOSCOPIC PROCEDURE TODAY AT THE [facility]: Refer to the procedure report that was given to you for any specific questions about what was found during the examination.  If the procedure report does not answer your questions, please call your gastroenterologist to clarify.  If you requested that your care partner not be given the details of your procedure findings, then the procedure report has been included in a sealed envelope for you to review at your convenience later.  YOU SHOULD EXPECT: Some feelings of bloating in the abdomen. Passage of more gas than usual.  Walking can help get rid of the air that was put into your GI tract during the procedure and reduce the bloating. If you had a lower endoscopy (such as a colonoscopy or flexible sigmoidoscopy) you may notice spotting of blood in your stool or on the toilet paper. If you underwent a bowel prep for your procedure, then you may not have a normal bowel movement for a few days.  DIET: Your first meal following the procedure should be a light meal and then it is ok to progress to your normal diet.  A half-sandwich or bowl of soup is an example of a good first meal.  Heavy or fried foods are harder to digest and may make you feel nauseous or bloated.  Likewise meals heavy in dairy and vegetables can cause extra gas to form and this can also increase the bloating.  Drink plenty of fluids but you should avoid alcoholic beverages for 24 hours.  ACTIVITY: Your care partner should take you home directly after the procedure.  You should plan to take it easy, moving slowly for the rest of the day.  You can resume normal activity the day after the procedure however you should NOT DRIVE or use heavy machinery for 24 hours (because of the sedation medicines used during the test).    SYMPTOMS TO REPORT IMMEDIATELY: A gastroenterologist can be reached at any hour.  During normal business hours, 8:30 AM to 5:00 PM Monday through Friday,  call [phone].  After hours and on weekends, please call the GI answering service at [phone] who will take a message and have the physician on call contact you.   Following lower endoscopy (colonoscopy or flexible sigmoidoscopy):  Excessive amounts of blood in the stool  Significant tenderness or worsening of abdominal pains  Swelling of the abdomen that is new, acute  Fever of 100F or higher  FOLLOW UP: If any biopsies were taken you will be contacted by phone or by letter within the next 1-3 weeks.  Call your gastroenterologist if you have not heard about the biopsies in 3 weeks.  Our staff will call the home number listed on your records the next business day following your procedure to check on you and address any questions or concerns that you may have at that time regarding the information given to you following your procedure. This is a courtesy call and so if there is no answer at the home number and we have not heard from you through the emergency physician on call, we will assume that you have returned to your regular daily activities without incident.  SIGNATURES/CONFIDENTIALITY: You and/or your care partner have signed paperwork which will be entered into your electronic medical record.  These signatures attest to the fact that that the information above on your After Visit Summary has been reviewed and is understood.  Full responsibility of the confidentiality of this   discharge information lies with you and/or your care-partner.  2 polyps found, removed and sent to pathology.  Await report for final recommendations.  Mild diverticulosis and small internal hemorrhoids.   High fiber diet recommended.  Continue Miralax for constipation.

## 2013-11-09 NOTE — Op Note (Signed)
[place]  [address] [place], [address]   COLONOSCOPY PROCEDURE REPORT  PATIENT: [name], [name]  MR#: [id number] BIRTHDATE: [birth year] , 65  yrs. old GENDER: female ENDOSCOPIST: Lafayette Dragon, MD REFERRED [id number] Charlett Blake, M.D. PROCEDURE DATE:  11/09/2013 PROCEDURE:   Colonoscopy with snare polypectomy First Screening Colonoscopy - Avg.  risk and is 50 yrs.  old or older - No.  Prior Negative Screening - Now for repeat screening. Less than 10 yrs Prior Negative Screening - Now for repeat screening.  Other: See Comments  History of Adenoma - Now for follow-up colonoscopy & has been > or = to 3 yrs.  N/A  Polyps Removed Today? Yes. ASA CLASS:   Class II INDICATIONS:last colonoscopy 2007.  Normal.  Change in bowel habits with predominant constipation.Marland Kitchen MEDICATIONS: Monitored anesthesia care and Propofol 300 mg IV  DESCRIPTION OF PROCEDURE:   After the risks benefits and alternatives of the procedure were thoroughly explained, informed consent was obtained.  The digital rectal exam revealed no abnormalities of the rectum.   The LB [id number]-[id number]  endoscope was introduced through the anus and advanced to the cecum, which was identified by both the appendix and ileocecal valve. No adverse events experienced.   The quality of the prep was good, using MoviPrep  The instrument was then slowly withdrawn as the colon was fully examined.      COLON FINDINGS: Two semi-pedunculated polyps measuring 10 mm in size were found in the descending colon and ascending colon.  A polypectomy was performed using snare cautery.  The resection was complete, the polyp tissue was completely retrieved and sent to histology.  A polypectomy was performed with a cold snare.  The resection was complete, the polyp tissue was completely retrieved and sent to histology.   There was mild diverticulosis noted in the sigmoid colon.  Retroflexed views revealed no abnormalities. The time  to cecum=6 minutes 26 seconds.  Withdrawal time=7 minutes 40 seconds.  The scope was withdrawn and the procedure completed. COMPLICATIONS: There were no immediate complications.  ENDOSCOPIC IMPRESSION: 1.   Two semi-pedunculated polyps were found in the descending colon and ascending colon; polypectomy was performed using snare cautery; polypectomy was performed with a cold snare 2.   Mild diverticulosis was noted in the sigmoid colon 3. small internal hemorrhoids  RECOMMENDATIONS: 1.  Await pathology results 2.  High fiber diet Continue MiraLax 9-17 g daily for constipation Recall colonoscopy pending path report  eSigned:  Lafayette Dragon, MD 11/09/2013 12:23 PM   cc:   PATIENT NAME:  [name], [name] MR#: [id number]

## 2013-11-10 ENCOUNTER — Telehealth: Payer: Self-pay | Admitting: *Deleted

## 2013-11-10 NOTE — Telephone Encounter (Signed)
  Follow up Call-  Call back number 11/09/2013  Post procedure Call Back phone  # [phone]  Permission to leave phone message Yes     Patient questions:  Do you have a fever, pain , or abdominal swelling? No. Pain Score  0 *  Have you tolerated food without any problems? Yes.    Have you been able to return to your normal activities? Yes.    Do you have any questions about your discharge instructions: Diet   No. Medications  No. Follow up visit  No.  Do you have questions or concerns about your Care? No.  Actions: * If pain score is 4 or above: No action needed, pain <4.

## 2013-11-13 ENCOUNTER — Other Ambulatory Visit: Payer: Self-pay | Admitting: Family Medicine

## 2013-11-15 ENCOUNTER — Encounter: Payer: Self-pay | Admitting: Internal Medicine

## 2013-11-28 ENCOUNTER — Other Ambulatory Visit (HOSPITAL_COMMUNITY)
Admission: RE | Admit: 2013-11-28 | Discharge: 2013-11-28 | Disposition: A | Discharge status: Home / Self Care | Payer: Commercial Managed Care - HMO | Source: Ambulatory Visit | Attending: Family Medicine | Admitting: Family Medicine

## 2013-11-28 ENCOUNTER — Encounter: Payer: Self-pay | Admitting: Family Medicine

## 2013-11-28 ENCOUNTER — Ambulatory Visit (INDEPENDENT_AMBULATORY_CARE_PROVIDER_SITE_OTHER): Payer: Commercial Managed Care - HMO | Admitting: Family Medicine

## 2013-11-28 ENCOUNTER — Ambulatory Visit (INDEPENDENT_AMBULATORY_CARE_PROVIDER_SITE_OTHER): Payer: Commercial Managed Care - HMO

## 2013-11-28 VITALS — BP 125/60 | HR 134 | Temp 98.5°F | Ht 63.0 in | Wt 222.0 lb

## 2013-11-28 DIAGNOSIS — K219 Gastro-esophageal reflux disease without esophagitis: Secondary | ICD-10-CM

## 2013-11-28 DIAGNOSIS — E785 Hyperlipidemia, unspecified: Secondary | ICD-10-CM

## 2013-11-28 DIAGNOSIS — N760 Acute vaginitis: Secondary | ICD-10-CM | POA: Diagnosis present

## 2013-11-28 DIAGNOSIS — R0600 Dyspnea, unspecified: Secondary | ICD-10-CM

## 2013-11-28 DIAGNOSIS — J45909 Unspecified asthma, uncomplicated: Secondary | ICD-10-CM

## 2013-11-28 DIAGNOSIS — Z23 Encounter for immunization: Secondary | ICD-10-CM

## 2013-11-28 DIAGNOSIS — E119 Type 2 diabetes mellitus without complications: Secondary | ICD-10-CM

## 2013-11-28 DIAGNOSIS — Z0000 Encounter for general adult medical examination without abnormal findings: Secondary | ICD-10-CM

## 2013-11-28 DIAGNOSIS — K5900 Constipation, unspecified: Secondary | ICD-10-CM

## 2013-11-28 DIAGNOSIS — R739 Hyperglycemia, unspecified: Secondary | ICD-10-CM

## 2013-11-28 DIAGNOSIS — R000 Tachycardia, unspecified: Secondary | ICD-10-CM

## 2013-11-28 DIAGNOSIS — I10 Essential (primary) hypertension: Secondary | ICD-10-CM

## 2013-11-28 MED ORDER — POTASSIUM CHLORIDE CRYS ER 20 MEQ PO TBCR: 20.0000 meq | EXTENDED_RELEASE_TABLET | Freq: Once | ORAL | Status: DC

## 2013-11-28 MED ORDER — RANITIDINE HCL 300 MG PO TABS: 300.0000 mg | ORAL_TABLET | Freq: Every day | ORAL | Status: DC

## 2013-11-28 MED ORDER — MONTELUKAST SODIUM 10 MG PO TABS: 10.0000 mg | ORAL_TABLET | Freq: Every day | ORAL | Status: DC

## 2013-11-28 MED ORDER — ESTROGENS, CONJUGATED 0.625 MG/GM VA CREA: 1.0000 | TOPICAL_CREAM | Freq: Every day | VAGINAL | Status: DC

## 2013-11-28 MED ORDER — PRAVASTATIN SODIUM 20 MG PO TABS: 20.0000 mg | ORAL_TABLET | Freq: Every day | ORAL | Status: DC

## 2013-11-28 MED ORDER — TIOTROPIUM BROMIDE MONOHYDRATE 18 MCG IN CAPS: 18.0000 ug | ORAL_CAPSULE | Freq: Every day | RESPIRATORY_TRACT | Status: DC

## 2013-11-28 MED ORDER — ALBUTEROL SULFATE 0.63 MG/3ML IN NEBU: INHALATION_SOLUTION | RESPIRATORY_TRACT | Status: DC

## 2013-11-28 NOTE — Progress Notes (Signed)
Pre visit review using our clinic review tool, if applicable. No additional management support is needed unless otherwise documented below in the visit note. 

## 2013-11-28 NOTE — Patient Instructions (Addendum)
Start the Premarin cream a small amount to mucosa 3 x a week and titrate as needed for decreased pain   Move next appt so it can be a preventative, GYN, AWV     Atrophic Vaginitis Atrophic vaginitis is a problem of low levels of estrogen in women. This problem can happen at any age. It is most common in women who have gone through menopause ("the change").  HOW WILL I KNOW IF I HAVE THIS PROBLEM? You may have:  Trouble with peeing (urinating), such as:  Going to the bathroom often.  A hard time holding your pee until you reach a bathroom.  Leaking pee.  Having pain when you pee.  Itching or a burning feeling.  Vaginal bleeding and spotting.  Pain during sex.  Dryness of the vagina.  A yellow, bad-smelling fluid (discharge) coming from the vagina. HOW WILL MY DOCTOR CHECK FOR THIS PROBLEM?  During your exam, your doctor will likely find the problem.  If there is a vaginal fluid, it may be checked for infection. HOW WILL THIS PROBLEM BE TREATED? Keep the vulvar skin as clean as possible. Moisturizers and lubricants can help with some of the symptoms. Estrogen replacement can help. There are 2 ways to take estrogen:  Systemic estrogen gets estrogen to your whole body. It takes many weeks or months before the symptoms get better.  You take an estrogen pill.  You use a skin patch. This is a patch that you put on your skin.  If you still have your uterus, your doctor may ask you to take a hormone. Talk to your doctor about the right medicine for you.  Estrogen cream.  This puts estrogen only at the part of your body where you apply it. The cream is put into the vagina or put on the vulvar skin. For some women, estrogen cream works faster than pills or the patch. CAN ALL WOMEN WITH THIS PROBLEM USE ESTROGEN? No. Women with certain types of cancer, liver problems, or problems with blood clots should not take estrogen. Your doctor can help you decide the best treatment for  your symptoms. Document Released: 06/25/2007 Document Revised: 01/11/2013 Document Reviewed: 06/25/2007 [facility] Patient Information 2015 [place], [place]. This information is not intended to replace advice given to you by your health care provider. Make sure you discuss any questions you have with your health care provider.

## 2013-11-29 LAB — LIPID PANEL
CHOLESTEROL: 158 mg/dL (ref 0–200)
HDL: 50.6 mg/dL (ref >39.00)
LDL Cholesterol: 87 mg/dL (ref 0–99)
NONHDL: 107.4
TRIGLYCERIDES: 103 mg/dL (ref 0.0–149.0)
Total CHOL/HDL Ratio: 3
VLDL: 20.6 mg/dL (ref 0.0–40.0)

## 2013-11-29 LAB — RENAL FUNCTION PANEL
ALBUMIN: 3.6 g/dL (ref 3.5–5.2)
BUN: 24 mg/dL — AB (ref 6–23)
CHLORIDE: 108 meq/L (ref 96–112)
CO2: 27 mEq/L (ref 19–32)
CREATININE: 1.4 mg/dL — AB (ref 0.4–1.2)
Calcium: 9.5 mg/dL (ref 8.4–10.5)
GFR: 40.55 mL/min — AB (ref >60.00)
GLUCOSE: 143 mg/dL — AB (ref 70–99)
Phosphorus: 3.2 mg/dL (ref 2.3–4.6)
Potassium: 4.7 mEq/L (ref 3.5–5.1)
Sodium: 146 mEq/L — ABNORMAL HIGH (ref 135–145)

## 2013-11-29 LAB — URINALYSIS, ROUTINE W REFLEX MICROSCOPIC
BILIRUBIN URINE: NEGATIVE
Hgb urine dipstick: NEGATIVE
Ketones, ur: NEGATIVE
Nitrite: NEGATIVE
RBC / HPF: NONE SEEN (ref 0–?)
SPECIFIC GRAVITY, URINE: 1.02 (ref 1.000–1.030)
Total Protein, Urine: NEGATIVE
Urine Glucose: NEGATIVE
Urobilinogen, UA: 0.2 (ref 0.0–1.0)
pH: 5.5 (ref 5.0–8.0)

## 2013-11-29 LAB — HEPATIC FUNCTION PANEL
ALT: 15 U/L (ref 0–35)
AST: 21 U/L (ref 0–37)
Albumin: 3.6 g/dL (ref 3.5–5.2)
Alkaline Phosphatase: 53 U/L (ref 39–117)
Bilirubin, Direct: 0.1 mg/dL (ref 0.0–0.3)
TOTAL PROTEIN: 7.5 g/dL (ref 6.0–8.3)
Total Bilirubin: 0.5 mg/dL (ref 0.2–1.2)

## 2013-11-29 LAB — CBC
HCT: 37.9 % (ref 36.0–46.0)
HEMOGLOBIN: 12.2 g/dL (ref 12.0–15.0)
MCHC: 32.2 g/dL (ref 30.0–36.0)
MCV: 93.1 fl (ref 78.0–100.0)
PLATELETS: 165 10*3/uL (ref 150.0–400.0)
RBC: 4.07 Mil/uL (ref 3.87–5.11)
RDW: 15 % (ref 11.5–15.5)
WBC: 7.9 10*3/uL (ref 4.0–10.5)

## 2013-11-29 LAB — HEMOGLOBIN A1C: Hgb A1c MFr Bld: 6.1 % (ref 4.6–6.5)

## 2013-11-29 LAB — TSH: TSH: 2.29 u[IU]/mL (ref 0.35–4.50)

## 2013-11-30 ENCOUNTER — Telehealth: Payer: Self-pay | Admitting: Family Medicine

## 2013-11-30 NOTE — Telephone Encounter (Signed)
Caller name: Tisheena Relation to pt: self Call back number: [phone] Pharmacy:   Reason for call:   Patient wants to know if her oxygen has been called in?

## 2013-12-01 ENCOUNTER — Other Ambulatory Visit: Payer: Self-pay

## 2013-12-01 ENCOUNTER — Other Ambulatory Visit: Payer: Self-pay | Admitting: Family Medicine

## 2013-12-01 DIAGNOSIS — R0902 Hypoxemia: Secondary | ICD-10-CM

## 2013-12-01 LAB — URINE CYTOLOGY ANCILLARY ONLY
Bacterial vaginitis: NEGATIVE
CANDIDA VAGINITIS: NEGATIVE

## 2013-12-01 MED ORDER — ALENDRONATE SODIUM 70 MG PO TABS: 70.0000 mg | ORAL_TABLET | ORAL | Status: DC

## 2013-12-01 NOTE — Telephone Encounter (Signed)
I have ordered O2 and printed order, I think now we have to call a home health agency and have them bring it out, try [facility] and see if they can take this order.

## 2013-12-04 ENCOUNTER — Other Ambulatory Visit: Payer: Self-pay | Admitting: Family Medicine

## 2013-12-04 DIAGNOSIS — Z0000 Encounter for general adult medical examination without abnormal findings: Secondary | ICD-10-CM | POA: Insufficient documentation

## 2013-12-04 NOTE — Progress Notes (Signed)
[name]  [id number] [birth year] 12/04/2013      Progress Note-Follow Up  Subjective  Chief Complaint  Chief Complaint  Patient presents with  . Annual Exam    physical  . Injections    flu    HPI  Patient is a 73 y.o. female in today for routine medical care. Is generally feeling but acknowledges she becomes increasingly SOB with exertion, when she rests it resolves. No CP associated. She recently had her colonoscopy and tolerated it well. She did not tolerate Baclofen. No acute complaints today. Denies CP/palp/SOB/HA/congestion/fevers/GI or GU c/o. Taking meds as prescribed  Past Medical History  Diagnosis Date  . Asthma     PFT 02/06/09 FEV1 1.41 (65%), FVC 1.92 (64), FEV1% 74, TLC 3.47 (71%), DLCO 48%, +BD  . ACE-inhibitor cough   . DVT (deep venous thrombosis) 1987  . GERD (gastroesophageal reflux disease)   . Arthritis   . Atypical chest pain     s/p cath, Normal coronaries, Non ST elevation myocardial infarction, Rt groin pseudoaneurysm  . Hyperlipidemia   . Hypertension   . Depression   . Gout   . Abnormal glucose tolerance test   . Sun-damaged skin 10/24/2012  . Urinary frequency 10/24/2012  . Unspecified constipation 06/05/2013  . UTI (lower urinary tract infection) 10/24/2012  . Pneumonia     Past Surgical History  Procedure Laterality Date  . Appendectomy  1951  . Tubal ligation  1968  . Tonsillectomy  1950    Family History  Problem Relation Age of Onset  . Coronary artery disease Sister   . Asthma Sister   . Coronary artery disease Brother     x2  . Coronary artery disease Other   . Uterine cancer Sister   . Emphysema Sister   . Emphysema Brother   . Colon polyps Sister     History   Social History  . Marital Status: Widowed    Spouse Name: N/A    Number of Children: 1  . Years of Education: N/A   Occupational History  . Retired    Social History Main Topics  . Smoking status: Never Smoker   . Smokeless tobacco: Never Used    . Alcohol Use: No  . Drug Use: No  . Sexual Activity: Not on file   Other Topics Concern  . Not on file   Social History Narrative    Current Outpatient Prescriptions on File Prior to Visit  Medication Sig Dispense Refill  . acetaminophen (TYLENOL) 500 MG tablet Take 500 mg by mouth every 6 (six) hours as needed.    Marland Kitchen albuterol (PROAIR HFA) 108 (90 BASE) MCG/ACT inhaler Inhale 2 puffs into the lungs every 4 (four) hours as needed. 3 Inhaler 3  . allopurinol (ZYLOPRIM) 100 MG tablet TAKE TWO TABLETS BY MOUTH ONCE DAILY 60 tablet 3  . budesonide-formoterol (SYMBICORT) 160-4.5 MCG/ACT inhaler Inhale 2 puffs into the lungs 2 (two) times daily. 3 Inhaler 1  . Cyanocobalamin (VITAMIN B-12 PO) Take by mouth daily.    . furosemide (LASIX) 40 MG tablet Take 0.5 tablets (20 mg total) by mouth every other day. 90 tablet 1  . glucose blood (ONE TOUCH TEST STRIPS) test strip Use as instructed to check blood sugar once weekly. DX 790.29     . losartan (COZAAR) 50 MG tablet Take 1 tablet (50 mg total) by mouth daily. 90 tablet 3  . Niacin (VITAMIN B-3 PO) Take 1 tablet by mouth daily.    Marland Kitchen  pantoprazole (PROTONIX) 40 MG tablet Take 1 tablet (40 mg total) by mouth daily. 90 tablet 3  . sertraline (ZOLOFT) 50 MG tablet Take 1 tablet (50 mg total) by mouth daily. 90 tablet 3  . traMADol (ULTRAM) 50 MG tablet TAKE ONE TABLET BY MOUTH THREE TIMES DAILY AS NEEDED 90 tablet 0  . baclofen (LIORESAL) 10 MG tablet Take 1 tablet (10 mg total) by mouth 3 (three) times daily as needed for muscle spasms. 90 tablet 5   No current facility-administered medications on file prior to visit.    Allergies  Allergen Reactions  . Ace Inhibitors   . Indomethacin   . Montelukast Sodium     REACTION: Heart palpitations, chest pain  . Oseltamivir Phosphate     REACTION: nausea, vomiting, diarrhea, dizziness  . Sulfonamide Derivatives     Review of Systems  Review of Systems  Constitutional: Negative for fever,  chills and malaise/fatigue.  HENT: Negative for congestion, hearing loss and nosebleeds.   Eyes: Negative for discharge.  Respiratory: Positive for shortness of breath. Negative for cough, sputum production and wheezing.   Cardiovascular: Negative for chest pain and leg swelling.  Gastrointestinal: Negative for heartburn, nausea, vomiting, abdominal pain, diarrhea, constipation and blood in stool.  Genitourinary: Negative for dysuria, urgency, frequency and hematuria.  Musculoskeletal: Negative for myalgias, back pain and falls.  Skin: Negative for rash.  Neurological: Negative for dizziness, tremors, sensory change, focal weakness, loss of consciousness, weakness and headaches.  Endo/Heme/Allergies: Negative for polydipsia. Does not bruise/bleed easily.  Psychiatric/Behavioral: Negative for depression and suicidal ideas. The patient is not nervous/anxious and does not have insomnia.     Objective  BP 125/60 mmHg  Pulse 134  Temp(Src) 98.5 F (36.9 C) (Oral)  Ht 5\' 3"  (1.6 m)  Wt 222 lb (100.699 kg)  BMI 39.34 kg/m2  SpO2 89%  Physical Exam  Physical Exam  Constitutional: She is oriented to person, place, and time and well-developed, well-nourished, and in no distress. No distress.  HENT:  Head: Normocephalic and atraumatic.  Right Ear: External ear normal.  Left Ear: External ear normal.  Nose: Nose normal.  Mouth/Throat: Oropharynx is clear and moist. No oropharyngeal exudate.  Eyes: Conjunctivae are normal. Pupils are equal, round, and reactive to light. Right eye exhibits no discharge. Left eye exhibits no discharge. No scleral icterus.  Neck: Normal range of motion. Neck supple. No thyromegaly present.  Cardiovascular: Normal rate, regular rhythm, normal heart sounds and intact distal pulses.   Pulmonary/Chest: Effort normal and breath sounds normal. No respiratory distress. She has no wheezes. She has no rales.  Abdominal: Soft. Bowel sounds are normal. She exhibits no  distension and no mass. There is no tenderness.  Musculoskeletal: Normal range of motion. She exhibits no edema or tenderness.  Lymphadenopathy:    She has no cervical adenopathy.  Neurological: She is alert and oriented to person, place, and time. She has normal reflexes. No cranial nerve deficit. Coordination normal.  Skin: Skin is warm and dry. No rash noted. She is not diaphoretic.  Psychiatric: Mood, memory and affect normal.    Lab Results  Component Value Date   TSH 2.29 11/28/2013   Lab Results  Component Value Date   WBC 7.9 11/28/2013   HGB 12.2 11/28/2013   HCT 37.9 11/28/2013   MCV 93.1 11/28/2013   PLT 165.0 11/28/2013   Lab Results  Component Value Date   CREATININE 1.4* 11/28/2013   BUN 24* 11/28/2013   NA 146* 11/28/2013  K 4.7 11/28/2013   CL 108 11/28/2013   CO2 27 11/28/2013   Lab Results  Component Value Date   ALT 15 11/28/2013   AST 21 11/28/2013   ALKPHOS 53 11/28/2013   BILITOT 0.5 11/28/2013   Lab Results  Component Value Date   CHOL 158 11/28/2013   Lab Results  Component Value Date   HDL 50.60 11/28/2013   Lab Results  Component Value Date   LDLCALC 87 11/28/2013   Lab Results  Component Value Date   TRIG 103.0 11/28/2013   Lab Results  Component Value Date   CHOLHDL 3 11/28/2013     Assessment & Plan  Essential hypertension Well controlled, no changes to meds. Encouraged heart healthy diet such as the DASH diet and exercise as tolerated.   Diabetes type 2, controlled hgba1c acceptable, minimize simple carbs. Increase exercise as tolerated. Continue current meds  Dyspnea O2 sats dropped to 87% u walking into exam room. Will arrange for home O2 for patient to use as needed. Follows closely with pulmonology  Constipation Encouraged increased hydration and fiber in diet. Daily probiotics. If bowels not moving can use MOM 2 tbls po in 4 oz of warm prune juice by mouth every 2-3 days. If no results then repeat in 4 hours  with  Dulcolax suppository pr, may repeat again in 4 more hours as needed. Seek care if symptoms worsen. Consider daily Miralax and/or Dulcolax if symptoms persist.   Tachycardia Occurs with ambulation then resolves with rest. Following with cardiology, Dr Primus Bravo annual wellness visit, subsequent Patient denies any difficulties at home. No trouble with ADLs, depression or falls. No recent changes to vision or hearing. Is UTD with immunizations. Is UTD with screening. Discussed Advanced Directives, patient agrees to bring [place] copies of documents if can. Encouraged heart healthy diet, exercise as tolerated and adequate sleep. Follows with cardiology, Dr Waymon Amato with pulmonology, Dr Carmon Ginsberg with gastroenterology, Dr Olevia Perches, last colonoscopy revealed 2 polyps repeat in 5 years, performed in October 2015 Follows with opthamology annually MGM was 12/14. Repeat every 1-2 years  Does not proceed with Paps anymore. Given flu shot today

## 2013-12-04 NOTE — Assessment & Plan Note (Signed)
O2 sats dropped to 87% u walking into exam room. Will arrange for home O2 for patient to use as needed. Follows closely with pulmonology

## 2013-12-04 NOTE — Assessment & Plan Note (Signed)
hgba1c acceptable, minimize simple carbs. Increase exercise as tolerated. Continue current meds 

## 2013-12-04 NOTE — Assessment & Plan Note (Signed)
Occurs with ambulation then resolves with rest. Following with cardiology, Dr Acie Fredrickson

## 2013-12-04 NOTE — Assessment & Plan Note (Signed)
Encouraged increased hydration and fiber in diet. Daily probiotics. If bowels not moving can use MOM 2 tbls po in 4 oz of warm prune juice by mouth every 2-3 days. If no results then repeat in 4 hours with  Dulcolax suppository pr, may repeat again in 4 more hours as needed. Seek care if symptoms worsen. Consider daily Miralax and/or Dulcolax if symptoms persist.  

## 2013-12-04 NOTE — Assessment & Plan Note (Signed)
Well controlled, no changes to meds. Encouraged heart healthy diet such as the DASH diet and exercise as tolerated.  °

## 2013-12-04 NOTE — Assessment & Plan Note (Addendum)
Patient denies any difficulties at home. No trouble with ADLs, depression or falls. No recent changes to vision or hearing. Is UTD with immunizations. Is UTD with screening. Discussed Advanced Directives, patient agrees to bring [place] copies of documents if can. Encouraged heart healthy diet, exercise as tolerated and adequate sleep. Follows with cardiology, Dr Waymon Amato with pulmonology, Dr Carmon Ginsberg with gastroenterology, Dr Olevia Perches, last colonoscopy revealed 2 polyps repeat in 5 years, performed in October 2015 Follows with opthamology annually MGM was 12/14. Repeat every 1-2 years  Does not proceed with Paps anymore. Given flu shot today

## 2013-12-06 ENCOUNTER — Telehealth: Payer: Self-pay | Admitting: Family Medicine

## 2013-12-06 NOTE — Telephone Encounter (Signed)
Caller name: Yvetta Coder Relation to pt: other / [employer] back number: [phone]   Reason for call:  Requesting notes why patient needs a oxygen tank

## 2014-01-12 ENCOUNTER — Emergency Department (HOSPITAL_BASED_OUTPATIENT_CLINIC_OR_DEPARTMENT_OTHER): Payer: Commercial Managed Care - HMO

## 2014-01-12 ENCOUNTER — Emergency Department (HOSPITAL_BASED_OUTPATIENT_CLINIC_OR_DEPARTMENT_OTHER)
Admission: EM | Admit: 2014-01-12 | Discharge: 2014-01-13 | Disposition: A | Discharge status: Home / Self Care | Payer: Commercial Managed Care - HMO | Attending: Emergency Medicine | Admitting: Emergency Medicine

## 2014-01-12 ENCOUNTER — Encounter (HOSPITAL_BASED_OUTPATIENT_CLINIC_OR_DEPARTMENT_OTHER): Payer: Self-pay

## 2014-01-12 DIAGNOSIS — R05 Cough: Secondary | ICD-10-CM | POA: Diagnosis present

## 2014-01-12 DIAGNOSIS — M109 Gout, unspecified: Secondary | ICD-10-CM | POA: Diagnosis not present

## 2014-01-12 DIAGNOSIS — R059 Cough, unspecified: Secondary | ICD-10-CM

## 2014-01-12 DIAGNOSIS — I10 Essential (primary) hypertension: Secondary | ICD-10-CM | POA: Insufficient documentation

## 2014-01-12 DIAGNOSIS — F329 Major depressive disorder, single episode, unspecified: Secondary | ICD-10-CM | POA: Insufficient documentation

## 2014-01-12 DIAGNOSIS — Z8701 Personal history of pneumonia (recurrent): Secondary | ICD-10-CM | POA: Diagnosis not present

## 2014-01-12 DIAGNOSIS — Z87440 Personal history of urinary (tract) infections: Secondary | ICD-10-CM | POA: Insufficient documentation

## 2014-01-12 DIAGNOSIS — R0602 Shortness of breath: Secondary | ICD-10-CM

## 2014-01-12 DIAGNOSIS — J441 Chronic obstructive pulmonary disease with (acute) exacerbation: Secondary | ICD-10-CM | POA: Insufficient documentation

## 2014-01-12 DIAGNOSIS — Z7952 Long term (current) use of systemic steroids: Secondary | ICD-10-CM | POA: Insufficient documentation

## 2014-01-12 DIAGNOSIS — K219 Gastro-esophageal reflux disease without esophagitis: Secondary | ICD-10-CM | POA: Insufficient documentation

## 2014-01-12 DIAGNOSIS — Z9981 Dependence on supplemental oxygen: Secondary | ICD-10-CM | POA: Diagnosis not present

## 2014-01-12 DIAGNOSIS — Z79899 Other long term (current) drug therapy: Secondary | ICD-10-CM | POA: Insufficient documentation

## 2014-01-12 DIAGNOSIS — Z872 Personal history of diseases of the skin and subcutaneous tissue: Secondary | ICD-10-CM | POA: Diagnosis not present

## 2014-01-12 DIAGNOSIS — R000 Tachycardia, unspecified: Secondary | ICD-10-CM | POA: Insufficient documentation

## 2014-01-12 DIAGNOSIS — Z86718 Personal history of other venous thrombosis and embolism: Secondary | ICD-10-CM | POA: Insufficient documentation

## 2014-01-12 DIAGNOSIS — M1990 Unspecified osteoarthritis, unspecified site: Secondary | ICD-10-CM | POA: Insufficient documentation

## 2014-01-12 DIAGNOSIS — E785 Hyperlipidemia, unspecified: Secondary | ICD-10-CM | POA: Diagnosis not present

## 2014-01-12 DIAGNOSIS — J209 Acute bronchitis, unspecified: Secondary | ICD-10-CM

## 2014-01-12 HISTORY — DX: Chronic obstructive pulmonary disease, unspecified: J44.9

## 2014-01-12 LAB — CBC
HCT: 35.4 % — ABNORMAL LOW (ref 36.0–46.0)
Hemoglobin: 11.1 g/dL — ABNORMAL LOW (ref 12.0–15.0)
MCH: 30.3 pg (ref 26.0–34.0)
MCHC: 31.4 g/dL (ref 30.0–36.0)
MCV: 96.7 fL (ref 78.0–100.0)
PLATELETS: 149 10*3/uL — AB (ref 150–400)
RBC: 3.66 MIL/uL — ABNORMAL LOW (ref 3.87–5.11)
RDW: 13.8 % (ref 11.5–15.5)
WBC: 8.4 10*3/uL (ref 4.0–10.5)

## 2014-01-12 LAB — BASIC METABOLIC PANEL
Anion gap: 10 (ref 5–15)
BUN: 23 mg/dL (ref 6–23)
CHLORIDE: 104 meq/L (ref 96–112)
CO2: 28 mmol/L (ref 19–32)
CREATININE: 1.06 mg/dL (ref 0.50–1.10)
Calcium: 10 mg/dL (ref 8.4–10.5)
GFR calc non Af Amer: 51 mL/min — ABNORMAL LOW (ref >90)
GFR, EST AFRICAN AMERICAN: 59 mL/min — AB (ref >90)
Glucose, Bld: 114 mg/dL — ABNORMAL HIGH (ref 70–99)
POTASSIUM: 3.9 mmol/L (ref 3.5–5.1)
SODIUM: 142 mmol/L (ref 135–145)

## 2014-01-12 LAB — BRAIN NATRIURETIC PEPTIDE: B NATRIURETIC PEPTIDE 5: 39.3 pg/mL (ref 0.0–100.0)

## 2014-01-12 MED ORDER — IPRATROPIUM-ALBUTEROL 0.5-2.5 (3) MG/3ML IN SOLN: 3.0000 mL | RESPIRATORY_TRACT | Status: DC

## 2014-01-12 MED ORDER — ALBUTEROL SULFATE (2.5 MG/3ML) 0.083% IN NEBU
2.5000 mg | INHALATION_SOLUTION | Freq: Once | RESPIRATORY_TRACT | Status: AC
  Administered 2014-01-13: 2.5 mg via RESPIRATORY_TRACT
  Filled 2014-01-12: qty 3

## 2014-01-12 MED ORDER — ALBUTEROL SULFATE (2.5 MG/3ML) 0.083% IN NEBU
5.0000 mg | INHALATION_SOLUTION | Freq: Once | RESPIRATORY_TRACT | Status: AC
  Administered 2014-01-12: 5 mg via RESPIRATORY_TRACT
  Filled 2014-01-12: qty 6

## 2014-01-12 MED ORDER — IPRATROPIUM-ALBUTEROL 0.5-2.5 (3) MG/3ML IN SOLN
3.0000 mL | RESPIRATORY_TRACT | Status: DC
  Administered 2014-01-12 – 2014-01-13 (×2): 3 mL via RESPIRATORY_TRACT
  Filled 2014-01-12 (×2): qty 3

## 2014-01-12 MED ORDER — METHYLPREDNISOLONE SODIUM SUCC 125 MG IJ SOLR
125.0000 mg | Freq: Once | INTRAMUSCULAR | Status: AC
  Administered 2014-01-12: 125 mg via INTRAVENOUS
  Filled 2014-01-12: qty 2

## 2014-01-12 MED ORDER — IOHEXOL 350 MG/ML SOLN
150.0000 mL | Freq: Once | INTRAVENOUS | Status: AC | PRN
  Administered 2014-01-12: 150 mL via INTRAVENOUS

## 2014-01-12 MED ORDER — ALBUTEROL SULFATE (2.5 MG/3ML) 0.083% IN NEBU
2.5000 mg | INHALATION_SOLUTION | Freq: Once | RESPIRATORY_TRACT | Status: AC
  Administered 2014-01-12: 2.5 mg via RESPIRATORY_TRACT
  Filled 2014-01-12: qty 3

## 2014-01-12 MED ORDER — LEVOFLOXACIN IN D5W 500 MG/100ML IV SOLN
500.0000 mg | INTRAVENOUS | Status: DC
  Administered 2014-01-12: 500 mg via INTRAVENOUS
  Filled 2014-01-12: qty 100

## 2014-01-12 NOTE — ED Provider Notes (Signed)
CSN: [id number]     Arrival date & time 01/12/14  1832 History  This chart was scribed for Margaret Beards, MD by Randa Evens, ED Scribe. This patient was seen in room MH04/MH04 and the patient's care was started at 8:06 PM.      Chief Complaint  Patient presents with  . Cough   The history is provided by the patient. No language interpreter was used.   HPI Comments: [name] is a 73 y.o. female who presents to the Emergency Department complaining of productive cough of green sputum onset 2 days prior. Pr states she has has associated sore throat, voice change States she is always on 2L of oxygen for chronic asthma and bronchitis. States she used one breathing treatment that provided slight relief. Denies fever or other related symptoms . Denies recent use of steroids or antibiotics. No chest pain.  No leg swelling.     Past Medical History  Diagnosis Date  . Asthma     PFT 02/06/09 FEV1 1.41 (65%), FVC 1.92 (64), FEV1% 74, TLC 3.47 (71%), DLCO 48%, +BD  . ACE-inhibitor cough   . DVT (deep venous thrombosis) 1987  . GERD (gastroesophageal reflux disease)   . Arthritis   . Atypical chest pain     s/p cath, Normal coronaries, Non ST elevation myocardial infarction, Rt groin pseudoaneurysm  . Hyperlipidemia   . Hypertension   . Depression   . Gout   . Abnormal glucose tolerance test   . Sun-damaged skin 10/24/2012  . Urinary frequency 10/24/2012  . Unspecified constipation 06/05/2013  . UTI (lower urinary tract infection) 10/24/2012  . Pneumonia   . COPD (chronic obstructive pulmonary disease)    Past Surgical History  Procedure Laterality Date  . Appendectomy  1951  . Tubal ligation  1968  . Tonsillectomy  1950   Family History  Problem Relation Age of Onset  . Coronary artery disease Sister   . Asthma Sister   . Coronary artery disease Brother     x2  . Coronary artery disease Other   . Uterine cancer Sister   . Emphysema Sister   . Emphysema Brother   . Colon  polyps Sister    History  Substance Use Topics  . Smoking status: Never Smoker   . Smokeless tobacco: Never Used  . Alcohol Use: No   OB History    No data available      Review of Systems  Constitutional: Negative for fever.  HENT: Positive for sore throat and voice change.   Respiratory: Positive for cough.   All other systems reviewed and are negative.    Allergies  Ace inhibitors; Indomethacin; Montelukast sodium; Oseltamivir phosphate; Sulfa antibiotics; and Sulfonamide derivatives  Home Medications   Prior to Admission medications   Medication Sig Start Date End Date Taking? Authorizing Provider  OXYGEN Inhale into the lungs.   Yes Historical Provider, MD  acetaminophen (TYLENOL) 500 MG tablet Take 500 mg by mouth every 6 (six) hours as needed.    Historical Provider, MD  albuterol (ACCUNEB) 0.63 MG/3ML nebulizer solution USE ONE VIAL IN NEBULIZER EVERY 6 HOURS AS NEEDED 11/28/13   Mosie Lukes, MD  albuterol (PROAIR HFA) 108 (90 BASE) MCG/ACT inhaler Inhale 2 puffs into the lungs every 4 (four) hours as needed. 02/21/13   Mosie Lukes, MD  alendronate (FOSAMAX) 70 MG tablet Take 1 tablet (70 mg total) by mouth every 7 (seven) days. Take with a full glass of water  on an empty stomach. 12/01/13   Mosie Lukes, MD  allopurinol (ZYLOPRIM) 100 MG tablet TAKE TWO TABLETS BY MOUTH ONCE DAILY 11/14/13   Mosie Lukes, MD  baclofen (LIORESAL) 10 MG tablet Take 1 tablet (10 mg total) by mouth 3 (three) times daily as needed for muscle spasms. 08/23/13   Mosie Lukes, MD  budesonide-formoterol [facility]) 160-4.5 MCG/ACT inhaler Inhale 2 puffs into the lungs 2 (two) times daily. 07/18/13   Mosie Lukes, MD  conjugated estrogens (PREMARIN) vaginal cream Place 1 Applicatorful vaginally daily. 11/28/13   Mosie Lukes, MD  Cyanocobalamin (VITAMIN B-12 PO) Take by mouth daily.    Historical Provider, MD  furosemide (LASIX) 40 MG tablet Take 0.5 tablets (20 mg total) by mouth every  other day. 03/14/13   Mosie Lukes, MD  glucose blood (ONE TOUCH TEST STRIPS) test strip Use as instructed to check blood sugar once weekly. DX 790.29     Historical Provider, MD  levofloxacin (LEVAQUIN) 500 MG tablet Take 1 tablet (500 mg total) by mouth daily. 01/13/14   Margaret Beards, MD  losartan (COZAAR) 50 MG tablet Take 1 tablet (50 mg total) by mouth daily. 08/23/13   Mosie Lukes, MD  montelukast (SINGULAIR) 10 MG tablet Take 1 tablet (10 mg total) by mouth at bedtime. 11/28/13   Mosie Lukes, MD  Niacin (VITAMIN B-3 PO) Take 1 tablet by mouth daily.    Historical Provider, MD  pantoprazole (PROTONIX) 40 MG tablet Take 1 tablet (40 mg total) by mouth daily. 08/23/13   Mosie Lukes, MD  potassium chloride SA (K-DUR,KLOR-CON) 20 MEQ tablet Take 1 tablet (20 mEq total) by mouth once. 11/28/13   Mosie Lukes, MD  pravastatin (PRAVACHOL) 20 MG tablet Take 1 tablet (20 mg total) by mouth daily. 11/28/13   Mosie Lukes, MD  predniSONE (DELTASONE) 50 MG tablet Take 1 tablet (50 mg total) by mouth daily. 01/13/14   Margaret Beards, MD  ranitidine (ZANTAC) 300 MG tablet Take 1 tablet (300 mg total) by mouth at bedtime. 11/28/13   Mosie Lukes, MD  sertraline (ZOLOFT) 50 MG tablet Take 1 tablet (50 mg total) by mouth daily. 08/23/13   Mosie Lukes, MD  tiotropium (SPIRIVA) 18 MCG inhalation capsule Place 1 capsule (18 mcg total) into inhaler and inhale daily. 11/28/13   Mosie Lukes, MD  traMADol (ULTRAM) 50 MG tablet TAKE ONE TABLET BY MOUTH THREE TIMES DAILY AS NEEDED 12/05/13   Mosie Lukes, MD   Triage Vitals: BP 134/71 mmHg  Pulse 108  Temp(Src) 98.4 F (36.9 C) (Oral)  Resp 20  Ht 5\' 5"  (1.651 m)  Wt 200 lb (90.719 kg)  BMI 33.28 kg/m2  SpO2 99%  Physical Exam  Constitutional: She is oriented to person, place, and time. She appears well-developed and well-nourished. No distress.  HENT:  Head: Normocephalic and atraumatic.  Eyes: Conjunctivae and EOM are normal.  Neck: Neck  supple. No tracheal deviation present.  Cardiovascular: Normal rate.   Pulmonary/Chest: Effort normal. No respiratory distress. She has wheezes.  Musculoskeletal: Normal range of motion.  Neurological: She is alert and oriented to person, place, and time.  Skin: Skin is warm and dry.  Psychiatric: She has a normal mood and affect. Her behavior is normal.  Nursing note and vitals reviewed. note- lungs with bilateral expiratory wheezing, normal respiratory effort, [place] O2 in place.  No peripheral edema of lower extremities  ED Course  Procedures (including critical care time) DIAGNOSTIC STUDIES: Oxygen Saturation is 99% on [place], normal by my interpretation.    COORDINATION OF CARE: 8:20 PM-Discussed treatment plan with pt at bedside and pt agreed to plan.     Labs Review Labs Reviewed  CBC - Abnormal; Notable for the following:    RBC 3.66 (*)    Hemoglobin 11.1 (*)    HCT 35.4 (*)    Platelets 149 (*)    All other components within normal limits  BASIC METABOLIC PANEL - Abnormal; Notable for the following:    Glucose, Bld 114 (*)    GFR calc non Af Amer 51 (*)    GFR calc Af Amer 59 (*)    All other components within normal limits  BRAIN NATRIURETIC PEPTIDE    Imaging Review Dg Chest 2 View  01/12/2014   CLINICAL DATA:  Productive cough for 2 days  EXAM: CHEST  2 VIEW  COMPARISON:  03/11/2013 chest radiograph, chest CT same date, dictated separately  FINDINGS: Mild cardiomegaly noted with central vascular congestion and prominence of the pulmonary arteries suggesting pulmonary arterial hypertension. No focal pulmonary opacity. No pleural effusion. No acute osseous finding.  IMPRESSION: Cardiomegaly without focal acute finding.   Electronically Signed   By: Conchita Paris M.D.   On: 01/12/2014 23:45   Ct Angio Chest Pe W/cm &/or Wo Cm  01/12/2014   CLINICAL DATA:  Productive cough for 2 days  EXAM: CT ANGIOGRAPHY CHEST WITH CONTRAST  TECHNIQUE: Multidetector CT imaging of the  chest was performed using the standard protocol during bolus administration of intravenous contrast. Multiplanar CT image reconstructions and MIPs were obtained to evaluate the vascular anatomy.  CONTRAST:  182mL OMNIPAQUE IOHEXOL 350 MG/ML SOLN  COMPARISON:  Chest radiograph same date, CT chest 04/27/2009  FINDINGS: Mild cardiomegaly. No pericardial or pleural effusion. Subcarinal lymphadenopathy, representative 1.5 cm node, increased since previously. Right hilar node measures 1.1 cm image 37. AP window lymph node measures 1.1 cm image 32. Pretracheal node measures 1.2 cm image 29. Great vessels are normal in caliber.  Incomplete imaging of the upper abdomen demonstrates 1.0 cm anterior segment right hepatic lobe cyst image 84. Periaortic node subjacent to the right diaphragmatic gross measures ovoid 8 cm image 76.  Insert peak he technique No focal filling defect is identified up to and including the 3rd order pulmonary arteries to suggest acute pulmonary embolism. Tapering of the pulmonary arteries with mild central ectasia is suggestive of pulmonary arterial hypertension.  Minimal interlobular septal thickening is identified with central bronchial wall thickening. A few areas of patchy ground-glass airspace opacity are identified in a mosaic perfusion pattern. Central airways are patent.  No acute osseous abnormality.  Review of the MIP images confirms the above findings.  IMPRESSION: Cardiomegaly with findings suggesting very early interstitial edema.  Bronchial wall thickening may be related to early edema or bronchitis.  No CT evidence for acute pulmonary embolism.  Lymphadenopathy is likely reactive in the setting of probable mild interstitial edema but otherwise nonspecific.   Electronically Signed   By: Conchita Paris M.D.   On: 01/12/2014 23:50     EKG Interpretation None      MDM   Final diagnoses:  Cough  Shortness of breath  Bronchospasm with bronchitis, acute   Pt presenting with c/o  cough and shortness of breath.  She is wheezing on exam.  She does have hx of DVT and is tachycardic, so CT angio of chest obtained.  This  was negative for PE.  Pt was started on abx, steroids and given 3 duoneb treatments with relief of her symptoms.  O2 sats remained stable on her home 2L [place] O2.  Discharged with strict return precautions.  Pt agreeable with plan.   Xray images reviewed and interpreted by me as well.  Nursing notes including past medical history and social history reviewed and considered in documentation Prior records reviewed and considered during this visit    I personally performed the services described in this documentation, which was scribed in my presence. The recorded information has been reviewed and is accurate.      Margaret Beards, MD 01/13/14 [id number]

## 2014-01-12 NOTE — ED Notes (Signed)
Pt at CT will get Vitals when she returns

## 2014-01-12 NOTE — ED Notes (Signed)
C/o prod cough, sore throat x 2 days-pt on O2 2 LNC 24/7

## 2014-01-13 MED ORDER — LEVOFLOXACIN 500 MG PO TABS: 500.0000 mg | ORAL_TABLET | Freq: Every day | ORAL | Status: DC

## 2014-01-13 MED ORDER — PREDNISONE 50 MG PO TABS: 50.0000 mg | ORAL_TABLET | Freq: Every day | ORAL | Status: DC

## 2014-01-13 NOTE — Discharge Instructions (Signed)
Return to the ED with any concerns including difficulty breathing despite using albuterol every 4 hours, not drinking fluids, decreased urine output, vomiting and not able to keep down liquids or medications, decreased level of alertness/lethargy, or any other alarming symptoms °

## 2014-01-23 ENCOUNTER — Other Ambulatory Visit: Payer: Self-pay | Admitting: Family Medicine

## 2014-01-23 NOTE — Telephone Encounter (Signed)
Last filled: 12/05/13 Amt: 90, 0 refills Last OV:  11/28/13 Contract on file No UDS  Please advise.

## 2014-01-24 ENCOUNTER — Encounter: Payer: Self-pay | Admitting: Physician Assistant

## 2014-01-24 ENCOUNTER — Ambulatory Visit (INDEPENDENT_AMBULATORY_CARE_PROVIDER_SITE_OTHER): Payer: Commercial Managed Care - HMO | Admitting: Physician Assistant

## 2014-01-24 VITALS — BP 136/78 | HR 108 | Temp 98.5°F | Resp 18 | Ht 65.0 in | Wt 221.4 lb

## 2014-01-24 DIAGNOSIS — H6982 Other specified disorders of Eustachian tube, left ear: Secondary | ICD-10-CM

## 2014-01-24 MED ORDER — FLUTICASONE PROPIONATE 50 MCG/ACT NA SUSP: 2.0000 | Freq: Every day | NASAL | Status: DC

## 2014-01-24 NOTE — Progress Notes (Signed)
Pre visit review using our clinic review tool, if applicable. No additional management support is needed unless otherwise documented below in the visit note/SLS  

## 2014-01-24 NOTE — Patient Instructions (Signed)
I am glad you are feeling better.  Please finish the entire course of Levaquin.  Stay well hydrated.  Continue your chronic medications.  For the ear pressure/pain, use the Flonase daily as directed to relieve the pressure and fluid.  Call or return to clinic if symptoms are not improving.

## 2014-01-27 ENCOUNTER — Ambulatory Visit: Payer: Commercial Managed Care - HMO | Admitting: Family Medicine

## 2014-01-29 DIAGNOSIS — H6990 Unspecified Eustachian tube disorder, unspecified ear: Secondary | ICD-10-CM | POA: Insufficient documentation

## 2014-01-29 DIAGNOSIS — H6980 Other specified disorders of Eustachian tube, unspecified ear: Secondary | ICD-10-CM | POA: Insufficient documentation

## 2014-01-29 HISTORY — DX: Unspecified eustachian tube disorder, unspecified ear: H69.90

## 2014-01-29 NOTE — Assessment & Plan Note (Signed)
Finish last day of Levaquin.  Stay well-hydrated.  Start Flonase daily.  Begin a daily claritin or Zyrtec.  Call or return to clinic if symptoms are not improving.

## 2014-01-29 NOTE — Progress Notes (Signed)
Patient presents to clinic today c/o left ear pain after completing round of antibiotics for acute bacterial bronchitis.  Endorses chest symptoms have almost completely resolved.  Denies fever,chills.  Does endorse some residual fatigue.  Past Medical History  Diagnosis Date  . Asthma     PFT 02/06/09 FEV1 1.41 (65%), FVC 1.92 (64), FEV1% 74, TLC 3.47 (71%), DLCO 48%, +BD  . ACE-inhibitor cough   . DVT (deep venous thrombosis) 1987  . GERD (gastroesophageal reflux disease)   . Arthritis   . Atypical chest pain     s/p cath, Normal coronaries, Non ST elevation myocardial infarction, Rt groin pseudoaneurysm  . Hyperlipidemia   . Hypertension   . Depression   . Gout   . Abnormal glucose tolerance test   . Sun-damaged skin 10/24/2012  . Urinary frequency 10/24/2012  . Unspecified constipation 06/05/2013  . UTI (lower urinary tract infection) 10/24/2012  . Pneumonia   . COPD (chronic obstructive pulmonary disease)     Current Outpatient Prescriptions on File Prior to Visit  Medication Sig Dispense Refill  . acetaminophen (TYLENOL) 500 MG tablet Take 500 mg by mouth every 6 (six) hours as needed.    Marland Kitchen albuterol (ACCUNEB) 0.63 MG/3ML nebulizer solution USE ONE VIAL IN NEBULIZER EVERY 6 HOURS AS NEEDED 150 mL 1  . albuterol (PROAIR HFA) 108 (90 BASE) MCG/ACT inhaler Inhale 2 puffs into the lungs every 4 (four) hours as needed. 3 Inhaler 3  . alendronate (FOSAMAX) 70 MG tablet Take 1 tablet (70 mg total) by mouth every 7 (seven) days. Take with a full glass of water on an empty stomach. 12 tablet 1  . allopurinol (ZYLOPRIM) 100 MG tablet TAKE TWO TABLETS BY MOUTH ONCE DAILY 60 tablet 3  . budesonide-formoterol (SYMBICORT) 160-4.5 MCG/ACT inhaler Inhale 2 puffs into the lungs 2 (two) times daily. 3 Inhaler 1  . conjugated estrogens (PREMARIN) vaginal cream Place 1 Applicatorful vaginally daily. 42.5 g 12  . Cyanocobalamin (VITAMIN B-12 PO) Take by mouth daily.    . furosemide (LASIX) 40 MG  tablet Take 0.5 tablets (20 mg total) by mouth every other day. 90 tablet 1  . glucose blood (ONE TOUCH TEST STRIPS) test strip Use as instructed to check blood sugar once weekly. DX 790.29     . levofloxacin (LEVAQUIN) 500 MG tablet Take 1 tablet (500 mg total) by mouth daily. 10 tablet 0  . losartan (COZAAR) 50 MG tablet Take 1 tablet (50 mg total) by mouth daily. 90 tablet 3  . montelukast (SINGULAIR) 10 MG tablet Take 1 tablet (10 mg total) by mouth at bedtime. 90 tablet 3  . Niacin (VITAMIN B-3 PO) Take 1 tablet by mouth daily.    . OXYGEN Inhale into the lungs.    . pantoprazole (PROTONIX) 40 MG tablet Take 1 tablet (40 mg total) by mouth daily. 90 tablet 3  . potassium chloride SA (K-DUR,KLOR-CON) 20 MEQ tablet Take 1 tablet (20 mEq total) by mouth once. 90 tablet 3  . pravastatin (PRAVACHOL) 20 MG tablet Take 1 tablet (20 mg total) by mouth daily. 90 tablet 3  . ranitidine (ZANTAC) 300 MG tablet Take 1 tablet (300 mg total) by mouth at bedtime. 90 tablet 3  . sertraline (ZOLOFT) 50 MG tablet Take 1 tablet (50 mg total) by mouth daily. 90 tablet 3  . tiotropium (SPIRIVA) 18 MCG inhalation capsule Place 1 capsule (18 mcg total) into inhaler and inhale daily. 90 capsule 3  . traMADol (ULTRAM) 50  MG tablet TAKE ONE TABLET BY MOUTH THREE TIMES DAILY AS NEEDED 90 tablet 0   No current facility-administered medications on file prior to visit.    Allergies  Allergen Reactions  . Ace Inhibitors   . Indomethacin   . Montelukast Sodium     REACTION: Heart palpitations, chest pain  . Oseltamivir Phosphate     REACTION: nausea, vomiting, diarrhea, dizziness  . Sulfa Antibiotics Other (See Comments)    CP  . Sulfonamide Derivatives     Family History  Problem Relation Age of Onset  . Coronary artery disease Sister   . Asthma Sister   . Coronary artery disease Brother     x2  . Coronary artery disease Other   . Uterine cancer Sister   . Emphysema Sister   . Emphysema Brother   .  Colon polyps Sister     History   Social History  . Marital Status: Widowed    Spouse Name: N/A    Number of Children: 1  . Years of Education: N/A   Occupational History  . Retired    Social History Main Topics  . Smoking status: Never Smoker   . Smokeless tobacco: Never Used  . Alcohol Use: No  . Drug Use: No  . Sexual Activity: None   Other Topics Concern  . None   Social History Narrative    Review of Systems - See HPI.  All other ROS are negative.  BP 136/78 mmHg  Pulse 108  Temp(Src) 98.5 F (36.9 C) (Oral)  Resp 18  Ht $R'5\' 5"'tx$  (1.651 m)  Wt 221 lb 6 oz (100.415 kg)  BMI 36.84 kg/m2  SpO2 94%  Physical Exam  Constitutional: She is oriented to person, place, and time and well-developed, well-nourished, and in no distress.  HENT:  Head: Normocephalic and atraumatic.  Right Ear: Tympanic membrane, external ear and ear canal normal.  Left Ear: External ear and ear canal normal.  Ears:  Nose: Nose normal. Right sinus exhibits no maxillary sinus tenderness and no frontal sinus tenderness. Left sinus exhibits no maxillary sinus tenderness and no frontal sinus tenderness.  Mouth/Throat: Uvula is midline, oropharynx is clear and moist and mucous membranes are normal. No oropharyngeal exudate.  Eyes: Conjunctivae are normal.  Neck: Neck supple.  Cardiovascular: Normal rate, regular rhythm, normal heart sounds and intact distal pulses.   Pulmonary/Chest: Effort normal and breath sounds normal. No respiratory distress. She has no wheezes. She has no rales. She exhibits no tenderness.  Lymphadenopathy:    She has no cervical adenopathy.  Neurological: She is alert and oriented to person, place, and time.  Skin: Skin is warm and dry. No rash noted.  Psychiatric: Affect normal.  Vitals reviewed.   Recent Results (from the past 2160 hour(s))  Urine cytology ancillary only     Status: None   Collection Time: 11/28/13 12:00 AM  Result Value Ref Range   Bacterial  vaginitis Negative for Bacterial Vaginitis Microorganisms     Comment: Normal Reference Range - Negative   Candida vaginitis Negative for Candida Vaginitis Microorganisms     Comment: Normal Reference Range - Negative  TSH     Status: None   Collection Time: 11/28/13  3:04 PM  Result Value Ref Range   TSH 2.29 0.35 - 4.50 uIU/mL  CBC     Status: None   Collection Time: 11/28/13  3:04 PM  Result Value Ref Range   WBC 7.9 4.0 - 10.5 K/uL   RBC  4.07 3.87 - 5.11 Mil/uL   Platelets 165.0 150.0 - 400.0 K/uL   Hemoglobin 12.2 12.0 - 15.0 g/dL   HCT 37.9 36.0 - 46.0 %   MCV 93.1 78.0 - 100.0 fl   MCHC 32.2 30.0 - 36.0 g/dL   RDW 15.0 11.5 - 15.5 %  Renal function panel     Status: Abnormal   Collection Time: 11/28/13  3:04 PM  Result Value Ref Range   Sodium 146 (H) 135 - 145 mEq/L   Potassium 4.7 3.5 - 5.1 mEq/L   Chloride 108 96 - 112 mEq/L   CO2 27 19 - 32 mEq/L   Calcium 9.5 8.4 - 10.5 mg/dL   Albumin 3.6 3.5 - 5.2 g/dL   BUN 24 (H) 6 - 23 mg/dL   Creatinine, Ser 1.4 (H) 0.4 - 1.2 mg/dL   Glucose, Bld 143 (H) 70 - 99 mg/dL   Phosphorus 3.2 2.3 - 4.6 mg/dL   GFR 40.55 (L) >60.00 mL/min  Hepatic function panel     Status: None   Collection Time: 11/28/13  3:04 PM  Result Value Ref Range   Total Bilirubin 0.5 0.2 - 1.2 mg/dL   Bilirubin, Direct 0.1 0.0 - 0.3 mg/dL   Alkaline Phosphatase 53 39 - 117 U/L   AST 21 0 - 37 U/L   ALT 15 0 - 35 U/L   Total Protein 7.5 6.0 - 8.3 g/dL   Albumin 3.6 3.5 - 5.2 g/dL  Lipid panel     Status: None   Collection Time: 11/28/13  3:04 PM  Result Value Ref Range   Cholesterol 158 0 - 200 mg/dL    Comment: ATP III Classification       Desirable:  < 200 mg/dL               Borderline High:  200 - 239 mg/dL          High:  > = 240 mg/dL   Triglycerides 103.0 0.0 - 149.0 mg/dL    Comment: Normal:  <150 mg/dLBorderline High:  150 - 199 mg/dL   HDL 50.60 >39.00 mg/dL   VLDL 20.6 0.0 - 40.0 mg/dL   LDL Cholesterol 87 0 - 99 mg/dL   Total CHOL/HDL  Ratio 3     Comment:                Men          Women1/2 Average Risk     3.4          3.3Average Risk          5.0          4.42X Average Risk          9.6          7.13X Average Risk          15.0          11.0                       NonHDL 107.40     Comment: NOTE:  Non-HDL goal should be 30 mg/dL higher than patient's LDL goal (i.e. LDL goal of < 70 mg/dL, would have non-HDL goal of < 100 mg/dL)  Hemoglobin A1c     Status: None   Collection Time: 11/28/13  3:04 PM  Result Value Ref Range   Hgb A1c MFr Bld 6.1 4.6 - 6.5 %    Comment: Glycemic Control Guidelines for People  with Diabetes:Non Diabetic:  <6%Goal of Therapy: <7%Additional Action Suggested:  >8%   Urinalysis, Routine w reflex microscopic     Status: Abnormal   Collection Time: 11/28/13  3:04 PM  Result Value Ref Range   Color, Urine YELLOW Yellow;Lt. Yellow   APPearance CLEAR Clear   Specific Gravity, Urine 1.020 1.000-1.030   pH 5.5 5.0 - 8.0   Total Protein, Urine NEGATIVE Negative   Urine Glucose NEGATIVE Negative   Ketones, ur NEGATIVE Negative   Bilirubin Urine NEGATIVE Negative   Hgb urine dipstick NEGATIVE Negative   Urobilinogen, UA 0.2 0.0 - 1.0   Leukocytes, UA TRACE (A) Negative   Nitrite NEGATIVE Negative   WBC, UA 3-6/hpf (A) 0-2/hpf   RBC / HPF none seen 0-2/hpf   Squamous Epithelial / LPF Rare(0-4/hpf) Rare(0-4/hpf)  CBC     Status: Abnormal   Collection Time: 01/12/14  8:40 PM  Result Value Ref Range   WBC 8.4 4.0 - 10.5 K/uL   RBC 3.66 (L) 3.87 - 5.11 MIL/uL   Hemoglobin 11.1 (L) 12.0 - 15.0 g/dL   HCT 35.4 (L) 36.0 - 46.0 %   MCV 96.7 78.0 - 100.0 fL   MCH 30.3 26.0 - 34.0 pg   MCHC 31.4 30.0 - 36.0 g/dL   RDW 13.8 11.5 - 15.5 %   Platelets 149 (L) 150 - 400 K/uL  Basic metabolic panel     Status: Abnormal   Collection Time: 01/12/14  8:40 PM  Result Value Ref Range   Sodium 142 135 - 145 mmol/L    Comment: Please note change in reference range.   Potassium 3.9 3.5 - 5.1 mmol/L    Comment:  Please note change in reference range.   Chloride 104 96 - 112 mEq/L   CO2 28 19 - 32 mmol/L   Glucose, Bld 114 (H) 70 - 99 mg/dL   BUN 23 6 - 23 mg/dL   Creatinine, Ser 1.06 0.50 - 1.10 mg/dL   Calcium 10.0 8.4 - 10.5 mg/dL   GFR calc non Af Amer 51 (L) >90 mL/min   GFR calc Af Amer 59 (L) >90 mL/min    Comment: (NOTE) The eGFR has been calculated using the CKD EPI equation. This calculation has not been validated in all clinical situations. eGFR's persistently <90 mL/min signify possible Chronic Kidney Disease.    Anion gap 10 5 - 15  Brain natriuretic peptide     Status: None   Collection Time: 01/12/14  8:50 PM  Result Value Ref Range   B Natriuretic Peptide 39.3 0.0 - 100.0 pg/mL    Comment: Please note change in reference range.    Assessment/Plan: Eustachian tube dysfunction Finish last day of Levaquin.  Stay well-hydrated.  Start Flonase daily.  Begin a daily claritin or Zyrtec.  Call or return to clinic if symptoms are not improving.

## 2014-02-03 ENCOUNTER — Telehealth: Payer: Self-pay | Admitting: *Deleted

## 2014-02-03 ENCOUNTER — Other Ambulatory Visit: Payer: Self-pay

## 2014-02-03 DIAGNOSIS — Z1231 Encounter for screening mammogram for malignant neoplasm of breast: Secondary | ICD-10-CM

## 2014-02-03 NOTE — Telephone Encounter (Signed)
Received certificate of medical necessity for oxygen via fax from [place]. Form completed and forwarded to Dr. Charlett Blake. JG//CAM

## 2014-02-05 DIAGNOSIS — J4520 Mild intermittent asthma, uncomplicated: Secondary | ICD-10-CM | POA: Diagnosis not present

## 2014-02-05 DIAGNOSIS — R0902 Hypoxemia: Secondary | ICD-10-CM | POA: Diagnosis not present

## 2014-02-07 NOTE — Telephone Encounter (Signed)
faxed

## 2014-02-13 ENCOUNTER — Ambulatory Visit
Admission: RE | Admit: 2014-02-13 | Discharge: 2014-02-13 | Disposition: A | Discharge status: Home / Self Care | Payer: Commercial Managed Care - HMO | Source: Ambulatory Visit

## 2014-02-13 DIAGNOSIS — Z1231 Encounter for screening mammogram for malignant neoplasm of breast: Secondary | ICD-10-CM | POA: Diagnosis not present

## 2014-02-28 ENCOUNTER — Ambulatory Visit (INDEPENDENT_AMBULATORY_CARE_PROVIDER_SITE_OTHER): Payer: Commercial Managed Care - HMO | Admitting: Family Medicine

## 2014-02-28 ENCOUNTER — Other Ambulatory Visit (HOSPITAL_COMMUNITY)
Admission: RE | Admit: 2014-02-28 | Discharge: 2014-02-28 | Disposition: A | Discharge status: Home / Self Care | Payer: Commercial Managed Care - HMO | Source: Ambulatory Visit | Attending: Family Medicine | Admitting: Family Medicine

## 2014-02-28 ENCOUNTER — Encounter: Payer: Self-pay | Admitting: Family Medicine

## 2014-02-28 VITALS — BP 132/68 | HR 88 | Temp 98.0°F | Ht 65.0 in | Wt 223.4 lb

## 2014-02-28 DIAGNOSIS — I10 Essential (primary) hypertension: Secondary | ICD-10-CM

## 2014-02-28 DIAGNOSIS — E782 Mixed hyperlipidemia: Secondary | ICD-10-CM | POA: Diagnosis not present

## 2014-02-28 DIAGNOSIS — E669 Obesity, unspecified: Secondary | ICD-10-CM

## 2014-02-28 DIAGNOSIS — E119 Type 2 diabetes mellitus without complications: Secondary | ICD-10-CM | POA: Diagnosis not present

## 2014-02-28 DIAGNOSIS — M129 Arthropathy, unspecified: Secondary | ICD-10-CM | POA: Diagnosis not present

## 2014-02-28 DIAGNOSIS — Z124 Encounter for screening for malignant neoplasm of cervix: Secondary | ICD-10-CM | POA: Diagnosis not present

## 2014-02-28 DIAGNOSIS — E1169 Type 2 diabetes mellitus with other specified complication: Secondary | ICD-10-CM

## 2014-02-28 DIAGNOSIS — N760 Acute vaginitis: Secondary | ICD-10-CM | POA: Insufficient documentation

## 2014-02-28 DIAGNOSIS — K219 Gastro-esophageal reflux disease without esophagitis: Secondary | ICD-10-CM | POA: Diagnosis not present

## 2014-02-28 DIAGNOSIS — R0600 Dyspnea, unspecified: Secondary | ICD-10-CM

## 2014-02-28 DIAGNOSIS — D649 Anemia, unspecified: Secondary | ICD-10-CM

## 2014-02-28 DIAGNOSIS — M1990 Unspecified osteoarthritis, unspecified site: Secondary | ICD-10-CM | POA: Diagnosis not present

## 2014-02-28 DIAGNOSIS — A499 Bacterial infection, unspecified: Secondary | ICD-10-CM

## 2014-02-28 DIAGNOSIS — B9689 Other specified bacterial agents as the cause of diseases classified elsewhere: Secondary | ICD-10-CM

## 2014-02-28 LAB — COMPREHENSIVE METABOLIC PANEL
ALT: 13 U/L (ref 0–35)
AST: 16 U/L (ref 0–37)
Albumin: 4.1 g/dL (ref 3.5–5.2)
Alkaline Phosphatase: 42 U/L (ref 39–117)
BILIRUBIN TOTAL: 0.6 mg/dL (ref 0.2–1.2)
BUN: 20 mg/dL (ref 6–23)
CO2: 27 mEq/L (ref 19–32)
Calcium: 9.6 mg/dL (ref 8.4–10.5)
Chloride: 106 mEq/L (ref 96–112)
Creatinine, Ser: 1.04 mg/dL (ref 0.40–1.20)
GFR: 55.22 mL/min — ABNORMAL LOW (ref >60.00)
Glucose, Bld: 95 mg/dL (ref 70–99)
Potassium: 4.7 mEq/L (ref 3.5–5.1)
Sodium: 138 mEq/L (ref 135–145)
Total Protein: 6.9 g/dL (ref 6.0–8.3)

## 2014-02-28 LAB — CBC
HCT: 32.8 % — ABNORMAL LOW (ref 36.0–46.0)
HEMOGLOBIN: 10.9 g/dL — AB (ref 12.0–15.0)
MCHC: 33.2 g/dL (ref 30.0–36.0)
MCV: 90.6 fl (ref 78.0–100.0)
PLATELETS: 140 10*3/uL — AB (ref 150.0–400.0)
RBC: 3.62 Mil/uL — AB (ref 3.87–5.11)
RDW: 14.6 % (ref 11.5–15.5)
WBC: 6.7 10*3/uL (ref 4.0–10.5)

## 2014-02-28 LAB — HEMOGLOBIN A1C: Hgb A1c MFr Bld: 6.1 % (ref 4.6–6.5)

## 2014-02-28 LAB — LIPID PANEL
Cholesterol: 137 mg/dL (ref 0–200)
HDL: 55.5 mg/dL (ref >39.00)
LDL Cholesterol: 64 mg/dL (ref 0–99)
NonHDL: 81.5
TRIGLYCERIDES: 86 mg/dL (ref 0.0–149.0)
Total CHOL/HDL Ratio: 2
VLDL: 17.2 mg/dL (ref 0.0–40.0)

## 2014-02-28 LAB — MICROALBUMIN / CREATININE URINE RATIO
CREATININE, U: 74.1 mg/dL
MICROALB UR: 0.8 mg/dL (ref 0.0–1.9)
MICROALB/CREAT RATIO: 1.1 mg/g (ref 0.0–30.0)

## 2014-02-28 LAB — TSH: TSH: 2.5 u[IU]/mL (ref 0.35–4.50)

## 2014-02-28 LAB — RHEUMATOID FACTOR

## 2014-02-28 NOTE — Progress Notes (Signed)
Pre visit review using our clinic review tool, if applicable. No additional management support is needed unless otherwise documented below in the visit note. 

## 2014-02-28 NOTE — Patient Instructions (Signed)

## 2014-02-28 NOTE — Assessment & Plan Note (Signed)
Pap today, no concerns on exam. enarche at 14 Regular and moderate flow No history of abnormal pap in past G2P1, s/p 1 svd and 1 miscarriage history of abnormal MGM, h/o fibrocystic breast concerns today: 1 year of intermittent vaginal discharge with odor and  Burning, improved burning with Premarin  gyn surgeries: tubal Patient believes last pap 2009

## 2014-03-01 LAB — ANA: ANA: NEGATIVE

## 2014-03-02 ENCOUNTER — Telehealth: Payer: Self-pay | Admitting: Family Medicine

## 2014-03-02 ENCOUNTER — Telehealth: Payer: Self-pay | Admitting: *Deleted

## 2014-03-02 DIAGNOSIS — D649 Anemia, unspecified: Secondary | ICD-10-CM

## 2014-03-02 LAB — CERVICOVAGINAL ANCILLARY ONLY
Bacterial vaginitis: POSITIVE — AB
CANDIDA VAGINITIS: NEGATIVE

## 2014-03-02 LAB — CYTOLOGY - PAP

## 2014-03-02 MED ORDER — METRONIDAZOLE 500 MG PO TABS: ORAL_TABLET | ORAL | Status: DC

## 2014-03-02 NOTE — Telephone Encounter (Signed)
See telephone encounter for (03/02/14).//AB/CMA

## 2014-03-02 NOTE — Telephone Encounter (Signed)
Caller name: Lexxis, Raul Relation to pt: self  Call back number: [phone]   Reason for call:  Pt inquiring about last OV results

## 2014-03-02 NOTE — Telephone Encounter (Signed)
Spoke with the pt and informed her of recent lab results and note.  Pt verbalized understanding.  New prescription for the Flagyl was sent to the pharmacy, pt was scheduled an lab appt for (03/30/14 @ 1:15pm), future labs ordered and sent, and IFOB mailed to the pt.//AB/CMA

## 2014-03-02 NOTE — Telephone Encounter (Signed)
-----   Message from Mosie Lukes, MD sent at 02/28/2014 10:26 PM EST ----- Notify labs look good, RF is negative. Her anemia is slightly worse does need an IFOB to check her stool and a repeat CBC in 4 weeks have her start a Vitamin B complex

## 2014-03-02 NOTE — Telephone Encounter (Signed)
-----   Message from Mosie Lukes, MD sent at 03/02/2014  1:33 PM EST ----- Notify bacterial vaginosis, she needs Flagyl 500 mg po bid x 7 days and a probiotic daily

## 2014-03-07 ENCOUNTER — Telehealth (HOSPITAL_COMMUNITY): Payer: Self-pay | Admitting: Unknown Physician Specialty

## 2014-03-08 ENCOUNTER — Ambulatory Visit (HOSPITAL_BASED_OUTPATIENT_CLINIC_OR_DEPARTMENT_OTHER)
Admission: RE | Admit: 2014-03-08 | Discharge: 2014-03-08 | Disposition: A | Discharge status: Home / Self Care | Payer: Commercial Managed Care - HMO | Source: Ambulatory Visit | Attending: Family Medicine | Admitting: Family Medicine

## 2014-03-08 DIAGNOSIS — J4520 Mild intermittent asthma, uncomplicated: Secondary | ICD-10-CM | POA: Diagnosis not present

## 2014-03-08 DIAGNOSIS — R0600 Dyspnea, unspecified: Secondary | ICD-10-CM | POA: Insufficient documentation

## 2014-03-08 DIAGNOSIS — R0902 Hypoxemia: Secondary | ICD-10-CM | POA: Diagnosis not present

## 2014-03-08 NOTE — Progress Notes (Signed)
  Echocardiogram 2D Echocardiogram has been performed.  Darlina Sicilian M 03/08/2014, 3:42 PM

## 2014-03-09 ENCOUNTER — Other Ambulatory Visit: Payer: Self-pay | Admitting: Family Medicine

## 2014-03-10 NOTE — Telephone Encounter (Signed)
Faxed hardcopy for Tramadol To [facility]

## 2014-03-12 ENCOUNTER — Encounter: Payer: Self-pay | Admitting: Family Medicine

## 2014-03-12 DIAGNOSIS — B9689 Other specified bacterial agents as the cause of diseases classified elsewhere: Secondary | ICD-10-CM | POA: Insufficient documentation

## 2014-03-12 DIAGNOSIS — N184 Chronic kidney disease, stage 4 (severe): Secondary | ICD-10-CM

## 2014-03-12 DIAGNOSIS — D631 Anemia in chronic kidney disease: Secondary | ICD-10-CM | POA: Insufficient documentation

## 2014-03-12 DIAGNOSIS — N760 Acute vaginitis: Secondary | ICD-10-CM

## 2014-03-12 DIAGNOSIS — D649 Anemia, unspecified: Secondary | ICD-10-CM

## 2014-03-12 HISTORY — DX: Anemia, unspecified: D64.9

## 2014-03-12 HISTORY — DX: Other specified bacterial agents as the cause of diseases classified elsewhere: B96.89

## 2014-03-12 NOTE — Assessment & Plan Note (Signed)
hgba1c acceptable, minimize simple carbs. Increase exercise as tolerated. Continue current meds 

## 2014-03-12 NOTE — Assessment & Plan Note (Signed)
Avoid offending foods, start probiotics. Do not eat large meals in late evening and consider raising head of bed.  

## 2014-03-12 NOTE — Assessment & Plan Note (Signed)
Well controlled, no changes to meds. Encouraged heart healthy diet such as the DASH diet and exercise as tolerated.  °

## 2014-03-12 NOTE — Assessment & Plan Note (Signed)
Tolerating statin, encouraged heart healthy diet, avoid trans fats, minimize simple carbs and saturated fats. Increase exercise as tolerated 

## 2014-03-12 NOTE — Progress Notes (Signed)
[name]  [id number] 04/22/1940 03/12/2014      Progress Note-Follow Up  Subjective  Chief Complaint  Chief Complaint  Patient presents with  . Follow-up    HPI  Patient is a 74 y.o. female in today for routine medical care. Patient is in today for routine medical care. Is generally feeling well but is complaining of some mildly malodorous yellow vaginal discharge at times. There is a mild burning sensation. She is not sexually active. She denies any abdominal pain or back pain. No fevers or chills. No malaise, myalgias, anorexia, nausea or vomiting. Denies CP/palp/SOB/HA/congestion/fevers/GI or GU c/o. Taking meds as prescribed  Past Medical History  Diagnosis Date  . Asthma     PFT 02/06/09 FEV1 1.41 (65%), FVC 1.92 (64), FEV1% 74, TLC 3.47 (71%), DLCO 48%, +BD  . ACE-inhibitor cough   . DVT (deep venous thrombosis) 1987  . GERD (gastroesophageal reflux disease)   . Arthritis   . Atypical chest pain     s/p cath, Normal coronaries, Non ST elevation myocardial infarction, Rt groin pseudoaneurysm  . Hyperlipidemia   . Hypertension   . Depression   . Gout   . Abnormal glucose tolerance test   . Sun-damaged skin 10/24/2012  . Urinary frequency 10/24/2012  . Unspecified constipation 06/05/2013  . UTI (lower urinary tract infection) 10/24/2012  . Pneumonia   . COPD (chronic obstructive pulmonary disease)   . Anemia 03/12/2014  . Bacterial vaginosis 03/12/2014    Past Surgical History  Procedure Laterality Date  . Appendectomy  1951  . Tubal ligation  1968  . Tonsillectomy  1950    Family History  Problem Relation Age of Onset  . Asthma Sister   . Arthritis Sister   . Hypertension Sister   . Hyperlipidemia Sister   . Cancer Sister 75    uterine  . Uterine cancer Sister   . Coronary artery disease Brother     x2  . Cancer Brother     lung, smoker  . Arthritis Brother   . Coronary artery disease Other   . Hypertension Sister   . Arthritis Sister   .  Hyperlipidemia Sister   . Emphysema Sister   . COPD Sister     smoker  . Heart disease Sister   . Hyperlipidemia Sister   . Hypertension Sister   . Arthritis Sister   . Emphysema Brother   . Heart disease Brother     MI smoker  . Colon polyps Sister   . Mental illness Father   . Alcohol abuse Father   . Heart disease Mother     MI  . Hyperlipidemia Mother   . Epilepsy Daughter   . Hypertension Daughter   . Obesity Daughter   . COPD Brother     smoker  . Cancer Brother     lung    History   Social History  . Marital Status: Widowed    Spouse Name: N/A  . Number of Children: 1  . Years of Education: N/A   Occupational History  . Retired    Social History Main Topics  . Smoking status: Never Smoker   . Smokeless tobacco: Never Used  . Alcohol Use: No  . Drug Use: No  . Sexual Activity: No     Comment: lives alone, no dietary restrictions   Other Topics Concern  . Not on file   Social History Narrative    Current Outpatient Prescriptions on File Prior to Visit  Medication Sig Dispense Refill  . acetaminophen (TYLENOL) 500 MG tablet Take 500 mg by mouth every 6 (six) hours as needed.    Marland Kitchen albuterol (ACCUNEB) 0.63 MG/3ML nebulizer solution USE ONE VIAL IN NEBULIZER EVERY 6 HOURS AS NEEDED 150 mL 1  . albuterol (PROAIR HFA) 108 (90 BASE) MCG/ACT inhaler Inhale 2 puffs into the lungs every 4 (four) hours as needed. 3 Inhaler 3  . alendronate (FOSAMAX) 70 MG tablet Take 1 tablet (70 mg total) by mouth every 7 (seven) days. Take with a full glass of water on an empty stomach. 12 tablet 1  . allopurinol (ZYLOPRIM) 100 MG tablet TAKE TWO TABLETS BY MOUTH ONCE DAILY 60 tablet 3  . budesonide-formoterol (SYMBICORT) 160-4.5 MCG/ACT inhaler Inhale 2 puffs into the lungs 2 (two) times daily. 3 Inhaler 1  . conjugated estrogens (PREMARIN) vaginal cream Place 1 Applicatorful vaginally daily. 42.5 g 12  . Cyanocobalamin (VITAMIN B-12 PO) Take by mouth daily.    . fluticasone  (FLONASE) 50 MCG/ACT nasal spray Place 2 sprays into both nostrils daily. 16 g 6  . furosemide (LASIX) 40 MG tablet Take 0.5 tablets (20 mg total) by mouth every other day. 90 tablet 1  . glucose blood (ONE TOUCH TEST STRIPS) test strip Use as instructed to check blood sugar once weekly. DX 790.29     . montelukast (SINGULAIR) 10 MG tablet Take 1 tablet (10 mg total) by mouth at bedtime. 90 tablet 3  . Niacin (VITAMIN B-3 PO) Take 1 tablet by mouth daily.    . OXYGEN Inhale into the lungs.    . pantoprazole (PROTONIX) 40 MG tablet Take 1 tablet (40 mg total) by mouth daily. 90 tablet 3  . potassium chloride SA (K-DUR,KLOR-CON) 20 MEQ tablet Take 1 tablet (20 mEq total) by mouth once. 90 tablet 3  . pravastatin (PRAVACHOL) 20 MG tablet Take 1 tablet (20 mg total) by mouth daily. 90 tablet 3  . ranitidine (ZANTAC) 300 MG tablet Take 1 tablet (300 mg total) by mouth at bedtime. 90 tablet 3  . sertraline (ZOLOFT) 50 MG tablet Take 1 tablet (50 mg total) by mouth daily. 90 tablet 3  . tiotropium (SPIRIVA) 18 MCG inhalation capsule Place 1 capsule (18 mcg total) into inhaler and inhale daily. 90 capsule 3   No current facility-administered medications on file prior to visit.    Allergies  Allergen Reactions  . Ace Inhibitors   . Indomethacin   . Montelukast Sodium     REACTION: Heart palpitations, chest pain  . Oseltamivir Phosphate     REACTION: nausea, vomiting, diarrhea, dizziness  . Sulfa Antibiotics Other (See Comments)    CP  . Sulfonamide Derivatives     Review of Systems  Review of Systems  Constitutional: Negative for fever and malaise/fatigue.  HENT: Negative for congestion.   Eyes: Negative for discharge.  Respiratory: Negative for shortness of breath.   Cardiovascular: Negative for chest pain, palpitations and leg swelling.  Gastrointestinal: Negative for nausea, abdominal pain and diarrhea.  Genitourinary: Negative for dysuria.  Musculoskeletal: Negative for falls.    Skin: Negative for rash.  Neurological: Negative for loss of consciousness and headaches.  Endo/Heme/Allergies: Negative for polydipsia.  Psychiatric/Behavioral: Negative for depression and suicidal ideas. The patient is not nervous/anxious and does not have insomnia.     Objective  BP 132/68 mmHg  Pulse 88  Temp(Src) 98 F (36.7 C) (Oral)  Ht 5\' 5"  (1.651 m)  Wt 223 lb 6 oz (101.322 kg)  BMI 37.17 kg/m2  SpO2 93%  Physical Exam  Physical Exam  Constitutional: She is oriented to person, place, and time and well-developed, well-nourished, and in no distress. No distress.  HENT:  Head: Normocephalic and atraumatic.  Eyes: Conjunctivae are normal.  Neck: Neck supple. No thyromegaly present.  Cardiovascular: Normal rate, regular rhythm and normal heart sounds.   Pulmonary/Chest: Effort normal and breath sounds normal. She has no wheezes.  Abdominal: She exhibits no distension and no mass.  Genitourinary: Uterus normal, cervix normal, right adnexa normal and left adnexa normal. Vaginal discharge found.  Musculoskeletal: She exhibits no edema.  Lymphadenopathy:    She has no cervical adenopathy.  Neurological: She is alert and oriented to person, place, and time.  Skin: Skin is warm and dry. No rash noted. She is not diaphoretic.  Psychiatric: Memory, affect and judgment normal.    Lab Results  Component Value Date   TSH 2.50 02/28/2014   Lab Results  Component Value Date   WBC 6.7 02/28/2014   HGB 10.9* 02/28/2014   HCT 32.8* 02/28/2014   MCV 90.6 02/28/2014   PLT 140.0* 02/28/2014   Lab Results  Component Value Date   CREATININE 1.04 02/28/2014   BUN 20 02/28/2014   NA 138 02/28/2014   K 4.7 02/28/2014   CL 106 02/28/2014   CO2 27 02/28/2014   Lab Results  Component Value Date   ALT 13 02/28/2014   AST 16 02/28/2014   ALKPHOS 42 02/28/2014   BILITOT 0.6 02/28/2014   Lab Results  Component Value Date   CHOL 137 02/28/2014   Lab Results  Component  Value Date   HDL 55.50 02/28/2014   Lab Results  Component Value Date   LDLCALC 64 02/28/2014   Lab Results  Component Value Date   TRIG 86.0 02/28/2014   Lab Results  Component Value Date   CHOLHDL 2 02/28/2014     Assessment & Plan  Cervical cancer screening Pap today, no concerns on exam. enarche at 14 Regular and moderate flow No history of abnormal pap in past G2P1, s/p 1 svd and 1 miscarriage history of abnormal MGM, h/o fibrocystic breast concerns today: 1 year of intermittent vaginal discharge with odor and  Burning, improved burning with Premarin  gyn surgeries: tubal Patient believes last pap 2009   Essential hypertension Well controlled, no changes to meds. Encouraged heart healthy diet such as the DASH diet and exercise as tolerated.    GERD Avoid offending foods, start probiotics. Do not eat large meals in late evening and consider raising head of bed.    Diabetes type 2, controlled hgba1c acceptable, minimize simple carbs. Increase exercise as tolerated. Continue current meds   Hyperlipidemia, mixed Tolerating statin, encouraged heart healthy diet, avoid trans fats, minimize simple carbs and saturated fats. Increase exercise as tolerated   Bacterial vaginosis Started pm Flagyl and encouraged probiotics.

## 2014-03-12 NOTE — Assessment & Plan Note (Signed)
Started pm Flagyl and encouraged probiotics.

## 2014-03-14 ENCOUNTER — Other Ambulatory Visit: Payer: Self-pay | Admitting: Family Medicine

## 2014-03-16 ENCOUNTER — Other Ambulatory Visit: Payer: Self-pay | Admitting: Family Medicine

## 2014-03-29 ENCOUNTER — Other Ambulatory Visit: Payer: Self-pay | Admitting: Family Medicine

## 2014-03-30 ENCOUNTER — Encounter: Payer: Self-pay | Admitting: Critical Care Medicine

## 2014-03-30 ENCOUNTER — Other Ambulatory Visit: Payer: Commercial Managed Care - HMO

## 2014-03-30 ENCOUNTER — Ambulatory Visit (INDEPENDENT_AMBULATORY_CARE_PROVIDER_SITE_OTHER): Payer: Commercial Managed Care - HMO | Admitting: Critical Care Medicine

## 2014-03-30 VITALS — BP 124/77 | HR 89 | Temp 98.1°F | Ht 65.0 in | Wt 224.0 lb

## 2014-03-30 DIAGNOSIS — J4541 Moderate persistent asthma with (acute) exacerbation: Secondary | ICD-10-CM | POA: Diagnosis not present

## 2014-03-30 MED ORDER — MOMETASONE FURO-FORMOTEROL FUM 200-5 MCG/ACT IN AERO: 2.0000 | INHALATION_SPRAY | Freq: Two times a day (BID) | RESPIRATORY_TRACT | Status: DC

## 2014-03-30 MED ORDER — PREDNISONE 10 MG PO TABS: ORAL_TABLET | ORAL | Status: DC

## 2014-03-30 NOTE — Progress Notes (Signed)
Subjective:    Patient ID: [name], female    DOB: Jun 09, 1940, 74 y.o.   MRN: [id number]  HPI Comments: Hx of dyspnea, prior Dx asthma x 20yrs.  Pt with recent URI, ear issues. Rx ABX per pcp. Since off is worse Pt with chronic asthma and bronchitis.  Notes productive cough, no real chest pain. Notes prior pna. Never smoker   03/30/2014 Chief Complaint  Patient presents with  . Follow-up    last seen 05/2013.  Increased DOE since Nov 2015.  Cough at times with clear to yellow mucus.  No chest tightness/CP.  o2 started by PCP around Oct 2015.  Pt notes more dyspneic since 11/2013.  Notes more cough and mucus. Now on oxygen.  Mucus is clear to yellow.  Pt in ED 01/12/14 with acute bronchitis CT chest 12/2013: infiltrates and bronchial wall thickening.  Hilar and Mediastinal LAN reactive.   Notes some wheezing   Now on oxygen exertion only  Review of Systems  Constitutional: Positive for fatigue. Negative for chills, diaphoresis, activity change, appetite change and unexpected weight change.  HENT: Positive for congestion, nosebleeds, postnasal drip, sinus pressure, sneezing, tinnitus and voice change. Negative for dental problem, ear discharge, facial swelling, hearing loss, mouth sores and trouble swallowing.   Eyes: Positive for itching. Negative for photophobia, discharge and visual disturbance.  Respiratory: Positive for chest tightness. Negative for apnea, cough, choking and stridor.   Cardiovascular: Positive for palpitations.  Gastrointestinal: Negative for nausea, constipation, blood in stool and abdominal distention.       Notes GERD symptoms  Genitourinary: Negative for dysuria, urgency, frequency, hematuria, flank pain, decreased urine volume and difficulty urinating.  Musculoskeletal: Positive for back pain and joint swelling. Negative for myalgias, arthralgias, gait problem and neck stiffness.  Skin: Negative for color change and pallor.  Neurological: Positive for  dizziness and weakness. Negative for tremors, seizures, syncope, speech difficulty, light-headedness and numbness.  Hematological: Negative for adenopathy. Bruises/bleeds easily.  Psychiatric/Behavioral: Negative for confusion, sleep disturbance and agitation. The patient is not nervous/anxious.        Objective:   Physical Exam Filed Vitals:   03/30/14 1155  BP: 124/77  Pulse: 89  Temp: 98.1 F (36.7 C)  TempSrc: Oral  Height: 5\' 5"  (1.651 m)  Weight: 224 lb (101.606 kg)  SpO2: 91%    Gen: Pleasant, well-nourished, in no distress,  normal affect  ENT: No lesions,  mouth clear,  oropharynx clear, no postnasal drip  Neck: No JVD, no TMG, no carotid bruits  Lungs: No use of accessory muscles, no dullness to percussion, distant breath sounds, expired wheezes  Cardiovascular: RRR, heart sounds normal, no murmur or gallops, no peripheral edema  Abdomen: soft and NT, no HSM,  BS normal  Musculoskeletal: No deformities, no cyanosis or clubbing  Neuro: alert, non focal  Skin: Warm, no lesions or rashes  No results found.  12/24 CT Chest :  Minimal interlobular septal thickening is identified with central bronchial wall thickening. A few areas of patchy ground-glass airspace opacity are identified in a mosaic perfusion pattern. Central airways are patent.  No acute osseous abnormality.  Review of the MIP images confirms the above findings.  IMPRESSION: Cardiomegaly with findings suggesting very early interstitial edema.  Bronchial wall thickening may be related to early edema or bronchitis.  No CT evidence for acute pulmonary embolism.  Lymphadenopathy is likely reactive in the setting of probable mild interstitial edema but otherwise nonspecific.  Assessment & Plan:   Asthma, moderate persistent Moderate persistent asthma, inadequately controlled at this time Plan Prednisone Take 4 for three days 3 for three days 2 for three days 1 for three  days and stop Stop symbicort  Start Dulera 200 two puff twice daily Stay on spiriva  Stay on oxygen Return 4 months        Updated Medication List Outpatient Encounter Prescriptions as of 03/30/2014  Medication Sig  . acetaminophen (TYLENOL) 500 MG tablet Take 500 mg by mouth every 6 (six) hours as needed.  Marland Kitchen albuterol (ACCUNEB) 0.63 MG/3ML nebulizer solution USE ONE VIAL IN NEBULIZER EVERY 6 HOURS AS NEEDED  . albuterol (PROAIR HFA) 108 (90 BASE) MCG/ACT inhaler Inhale 2 puffs into the lungs every 4 (four) hours as needed.  Marland Kitchen alendronate (FOSAMAX) 70 MG tablet Take 1 tablet (70 mg total) by mouth every 7 (seven) days. Take with a full glass of water on an empty stomach.  Marland Kitchen allopurinol (ZYLOPRIM) 100 MG tablet TAKE TWO TABLETS BY MOUTH ONCE DAILY  . b complex vitamins tablet Take 1 tablet by mouth daily.  Marland Kitchen conjugated estrogens (PREMARIN) vaginal cream Place 1 Applicatorful vaginally daily. (Patient taking differently: Place 1 Applicatorful vaginally 3 (three) times a week. )  . Cyanocobalamin (VITAMIN B-12 PO) Take by mouth daily.  . fluticasone (FLONASE) 50 MCG/ACT nasal spray Place 2 sprays into both nostrils daily. (Patient taking differently: Place 2 sprays into both nostrils daily as needed. )  . furosemide (LASIX) 40 MG tablet TAKE 1/2 TABLET EVERY OTHER DAY  . glucose blood (ONE TOUCH TEST STRIPS) test strip Use as instructed to check blood sugar once weekly. DX 790.29   . losartan (COZAAR) 50 MG tablet Take 1 tablet by mouth daily.  . Niacin (VITAMIN B-3 PO) Take 1 tablet by mouth daily.  . OXYGEN Inhale into the lungs.  . pantoprazole (PROTONIX) 40 MG tablet Take 1 tablet (40 mg total) by mouth daily.  . potassium chloride SA (K-DUR,KLOR-CON) 20 MEQ tablet Take 1 tablet (20 mEq total) by mouth once. (Patient taking differently: Take 20 mEq by mouth daily. )  . pravastatin (PRAVACHOL) 20 MG tablet Take 1 tablet (20 mg total) by mouth daily.  . Probiotic Product (PROBIOTIC PO)  Take by mouth daily.  . ranitidine (ZANTAC) 300 MG tablet Take 1 tablet (300 mg total) by mouth at bedtime.  . sertraline (ZOLOFT) 50 MG tablet Take 1 tablet (50 mg total) by mouth daily.  Marland Kitchen tiotropium (SPIRIVA) 18 MCG inhalation capsule Place 1 capsule (18 mcg total) into inhaler and inhale daily.  . traMADol (ULTRAM) 50 MG tablet TAKE ONE TABLET BY MOUTH THREE TIMES DAILY AS NEEDED  . [DISCONTINUED] budesonide-formoterol (SYMBICORT) 160-4.5 MCG/ACT inhaler Inhale 2 puffs into the lungs 2 (two) times daily.  . mometasone-formoterol (DULERA) 200-5 MCG/ACT AERO Inhale 2 puffs into the lungs 2 (two) times daily.  . predniSONE (DELTASONE) 10 MG tablet Take 4 for three days 3 for three days 2 for three days 1 for three days and stop  . [DISCONTINUED] metroNIDAZOLE (FLAGYL) 500 MG tablet Take 1 tablet by mouth 2 times a daily x 7 days. (Patient not taking: Reported on 03/30/2014)  . [DISCONTINUED] montelukast (SINGULAIR) 10 MG tablet Take 1 tablet (10 mg total) by mouth at bedtime. (Patient not taking: Reported on 03/30/2014)

## 2014-03-30 NOTE — Patient Instructions (Signed)
Prednisone Take 4 for three days 3 for three days 2 for three days 1 for three days and stop Stop symbicort  Start Dulera 200 two puff twice daily Stay on spiriva  Stay on oxygen Return 4 months

## 2014-03-31 NOTE — Assessment & Plan Note (Signed)
Moderate persistent asthma, inadequately controlled at this time Plan Prednisone Take 4 for three days 3 for three days 2 for three days 1 for three days and stop Stop symbicort  Start Dulera 200 two puff twice daily Stay on spiriva  Stay on oxygen Return 4 months

## 2014-04-06 DIAGNOSIS — R0902 Hypoxemia: Secondary | ICD-10-CM | POA: Diagnosis not present

## 2014-04-06 DIAGNOSIS — J4520 Mild intermittent asthma, uncomplicated: Secondary | ICD-10-CM | POA: Diagnosis not present

## 2014-04-10 ENCOUNTER — Other Ambulatory Visit (INDEPENDENT_AMBULATORY_CARE_PROVIDER_SITE_OTHER): Payer: Commercial Managed Care - HMO

## 2014-04-10 DIAGNOSIS — D649 Anemia, unspecified: Secondary | ICD-10-CM | POA: Diagnosis not present

## 2014-04-10 LAB — FECAL OCCULT BLOOD, IMMUNOCHEMICAL: FECAL OCCULT BLD: POSITIVE — AB

## 2014-04-12 ENCOUNTER — Other Ambulatory Visit: Payer: Self-pay | Admitting: General Practice

## 2014-04-12 DIAGNOSIS — K219 Gastro-esophageal reflux disease without esophagitis: Secondary | ICD-10-CM

## 2014-04-12 DIAGNOSIS — R195 Other fecal abnormalities: Secondary | ICD-10-CM

## 2014-04-12 MED ORDER — PANTOPRAZOLE SODIUM 40 MG PO TBEC: 40.0000 mg | DELAYED_RELEASE_TABLET | Freq: Every day | ORAL | Status: DC

## 2014-04-26 ENCOUNTER — Other Ambulatory Visit: Payer: Self-pay | Admitting: Family Medicine

## 2014-04-27 NOTE — Telephone Encounter (Signed)
Pt is requesting refill on Tramadol.   Last OV: 02/28/2014 Last Fill: 03/09/2014 # [id number]  Please advise.

## 2014-04-27 NOTE — Telephone Encounter (Signed)
OK to refil Tramadol, same strength, same sig, same number

## 2014-04-28 NOTE — Telephone Encounter (Signed)
Rx printed, and signed and faxed to [facility].

## 2014-05-05 ENCOUNTER — Telehealth: Payer: Self-pay | Admitting: Family Medicine

## 2014-05-05 MED ORDER — PANTOPRAZOLE SODIUM 40 MG PO TBEC: 40.0000 mg | DELAYED_RELEASE_TABLET | Freq: Two times a day (BID) | ORAL | Status: DC

## 2014-05-05 NOTE — Telephone Encounter (Signed)
Relation to pt: self  Call back [place]: [facility] [address]), [place] [id number] (Phone) [phone] (Fax)         Reason for call:  Pt requesting a refill pantoprazole (PROTONIX) 40 MG tablet states RX needs to say twice daily.

## 2014-05-05 NOTE — Telephone Encounter (Signed)
Sent in prescription as requested. Pantoprazole was changed per lab results instructions 04/10/14.

## 2014-05-07 DIAGNOSIS — J4520 Mild intermittent asthma, uncomplicated: Secondary | ICD-10-CM | POA: Diagnosis not present

## 2014-05-07 DIAGNOSIS — R0902 Hypoxemia: Secondary | ICD-10-CM | POA: Diagnosis not present

## 2014-05-14 ENCOUNTER — Emergency Department (HOSPITAL_BASED_OUTPATIENT_CLINIC_OR_DEPARTMENT_OTHER)
Admission: EM | Admit: 2014-05-14 | Discharge: 2014-05-14 | Disposition: A | Discharge status: Home / Self Care | Payer: Commercial Managed Care - HMO | Attending: Emergency Medicine | Admitting: Emergency Medicine

## 2014-05-14 ENCOUNTER — Encounter (HOSPITAL_BASED_OUTPATIENT_CLINIC_OR_DEPARTMENT_OTHER): Payer: Self-pay

## 2014-05-14 ENCOUNTER — Emergency Department (HOSPITAL_BASED_OUTPATIENT_CLINIC_OR_DEPARTMENT_OTHER): Payer: Commercial Managed Care - HMO

## 2014-05-14 DIAGNOSIS — F329 Major depressive disorder, single episode, unspecified: Secondary | ICD-10-CM | POA: Insufficient documentation

## 2014-05-14 DIAGNOSIS — H578 Other specified disorders of eye and adnexa: Secondary | ICD-10-CM | POA: Diagnosis present

## 2014-05-14 DIAGNOSIS — K219 Gastro-esophageal reflux disease without esophagitis: Secondary | ICD-10-CM | POA: Insufficient documentation

## 2014-05-14 DIAGNOSIS — Z8701 Personal history of pneumonia (recurrent): Secondary | ICD-10-CM | POA: Insufficient documentation

## 2014-05-14 DIAGNOSIS — J449 Chronic obstructive pulmonary disease, unspecified: Secondary | ICD-10-CM | POA: Insufficient documentation

## 2014-05-14 DIAGNOSIS — I10 Essential (primary) hypertension: Secondary | ICD-10-CM | POA: Insufficient documentation

## 2014-05-14 DIAGNOSIS — R05 Cough: Secondary | ICD-10-CM | POA: Diagnosis not present

## 2014-05-14 DIAGNOSIS — D649 Anemia, unspecified: Secondary | ICD-10-CM | POA: Insufficient documentation

## 2014-05-14 DIAGNOSIS — M109 Gout, unspecified: Secondary | ICD-10-CM | POA: Diagnosis not present

## 2014-05-14 DIAGNOSIS — M1990 Unspecified osteoarthritis, unspecified site: Secondary | ICD-10-CM | POA: Diagnosis not present

## 2014-05-14 DIAGNOSIS — Z8742 Personal history of other diseases of the female genital tract: Secondary | ICD-10-CM | POA: Diagnosis not present

## 2014-05-14 DIAGNOSIS — Z87440 Personal history of urinary (tract) infections: Secondary | ICD-10-CM | POA: Diagnosis not present

## 2014-05-14 DIAGNOSIS — Z79899 Other long term (current) drug therapy: Secondary | ICD-10-CM | POA: Insufficient documentation

## 2014-05-14 DIAGNOSIS — Z7951 Long term (current) use of inhaled steroids: Secondary | ICD-10-CM | POA: Diagnosis not present

## 2014-05-14 DIAGNOSIS — H109 Unspecified conjunctivitis: Secondary | ICD-10-CM | POA: Diagnosis not present

## 2014-05-14 DIAGNOSIS — E785 Hyperlipidemia, unspecified: Secondary | ICD-10-CM | POA: Insufficient documentation

## 2014-05-14 DIAGNOSIS — R059 Cough, unspecified: Secondary | ICD-10-CM

## 2014-05-14 DIAGNOSIS — Z86718 Personal history of other venous thrombosis and embolism: Secondary | ICD-10-CM | POA: Diagnosis not present

## 2014-05-14 MED ORDER — CIPROFLOXACIN HCL 0.3 % OP SOLN
1.0000 [drp] | OPHTHALMIC | Status: DC
  Administered 2014-05-14: 1 [drp] via OPHTHALMIC
  Filled 2014-05-14: qty 2.5

## 2014-05-14 MED ORDER — FLUORESCEIN SODIUM 1 MG OP STRP
1.0000 | ORAL_STRIP | Freq: Once | OPHTHALMIC | Status: AC
  Administered 2014-05-14: 1 via OPHTHALMIC
  Filled 2014-05-14: qty 1

## 2014-05-14 MED ORDER — TETRACAINE HCL 0.5 % OP SOLN
2.0000 [drp] | Freq: Once | OPHTHALMIC | Status: AC
  Administered 2014-05-14: 2 [drp] via OPHTHALMIC
  Filled 2014-05-14: qty 2

## 2014-05-14 NOTE — ED Notes (Signed)
MD at bedside. 

## 2014-05-14 NOTE — Discharge Instructions (Signed)

## 2014-05-14 NOTE — ED Notes (Signed)
DC teaching done re: eye drop administration

## 2014-05-14 NOTE — ED Provider Notes (Signed)
CSN: [id number]     Arrival date & time 05/14/14  1214 History   First MD Initiated Contact with Patient 05/14/14 1321     Chief Complaint  Patient presents with  . Eye Drainage     (Consider location/radiation/quality/duration/timing/severity/associated sxs/prior Treatment) HPI Comments: She presents to the ER for evaluation of bilateral eye drainage. Patient reports that symptoms began in the right eye yesterday, but today it is both eyes. She has been noticing some blurred vision, irritation, burning and significant drainage with crusting of both eyes. She also has had cough and congestion over the last couple of days, is not short of breath. She does have a history of chronic lung disease, is on oxygen 24 hours a day. She is not expressing any chest pain.    Past Medical History  Diagnosis Date  . Asthma     PFT 02/06/09 FEV1 1.41 (65%), FVC 1.92 (64), FEV1% 74, TLC 3.47 (71%), DLCO 48%, +BD  . ACE-inhibitor cough   . DVT (deep venous thrombosis) 1987  . GERD (gastroesophageal reflux disease)   . Arthritis   . Atypical chest pain     s/p cath, Normal coronaries, Non ST elevation myocardial infarction, Rt groin pseudoaneurysm  . Hyperlipidemia   . Hypertension   . Depression   . Gout   . Abnormal glucose tolerance test   . Sun-damaged skin 10/24/2012  . Urinary frequency 10/24/2012  . Unspecified constipation 06/05/2013  . UTI (lower urinary tract infection) 10/24/2012  . Pneumonia   . COPD (chronic obstructive pulmonary disease)   . Anemia 03/12/2014  . Bacterial vaginosis 03/12/2014   Past Surgical History  Procedure Laterality Date  . Appendectomy  1951  . Tubal ligation  1968  . Tonsillectomy  1950   Family History  Problem Relation Age of Onset  . Asthma Sister   . Arthritis Sister   . Hypertension Sister   . Hyperlipidemia Sister   . Cancer Sister 8    uterine  . Uterine cancer Sister   . Coronary artery disease Brother     x2  . Cancer Brother     lung,  smoker  . Arthritis Brother   . Coronary artery disease Other   . Hypertension Sister   . Arthritis Sister   . Hyperlipidemia Sister   . Emphysema Sister   . COPD Sister     smoker  . Heart disease Sister   . Hyperlipidemia Sister   . Hypertension Sister   . Arthritis Sister   . Emphysema Brother   . Heart disease Brother     MI smoker  . Colon polyps Sister   . Mental illness Father   . Alcohol abuse Father   . Heart disease Mother     MI  . Hyperlipidemia Mother   . Epilepsy Daughter   . Hypertension Daughter   . Obesity Daughter   . COPD Brother     smoker  . Cancer Brother     lung   History  Substance Use Topics  . Smoking status: Never Smoker   . Smokeless tobacco: Never Used  . Alcohol Use: No   OB History    No data available     Review of Systems  Eyes: Positive for pain, discharge and redness.  Respiratory: Positive for cough.   All other systems reviewed and are negative.     Allergies  Ace inhibitors; Indomethacin; Montelukast sodium; Oseltamivir phosphate; Sulfa antibiotics; and Sulfonamide derivatives  Home Medications   Prior  to Admission medications   Medication Sig Start Date End Date Taking? Authorizing Provider  acetaminophen (TYLENOL) 500 MG tablet Take 500 mg by mouth every 6 (six) hours as needed.    Historical Provider, MD  albuterol (ACCUNEB) 0.63 MG/3ML nebulizer solution USE ONE VIAL IN NEBULIZER EVERY 6 HOURS AS NEEDED 11/28/13   Mosie Lukes, MD  albuterol (PROAIR HFA) 108 (90 BASE) MCG/ACT inhaler Inhale 2 puffs into the lungs every 4 (four) hours as needed. 02/21/13   Mosie Lukes, MD  alendronate (FOSAMAX) 70 MG tablet Take 1 tablet (70 mg total) by mouth every 7 (seven) days. Take with a full glass of water on an empty stomach. 12/01/13   Mosie Lukes, MD  allopurinol (ZYLOPRIM) 100 MG tablet TAKE TWO TABLETS BY MOUTH ONCE DAILY 03/30/14   Mosie Lukes, MD  b complex vitamins tablet Take 1 tablet by mouth daily.     Historical Provider, MD  conjugated estrogens (PREMARIN) vaginal cream Place 1 Applicatorful vaginally daily. Patient taking differently: Place 1 Applicatorful vaginally 3 (three) times a week.  11/28/13   Mosie Lukes, MD  Cyanocobalamin (VITAMIN B-12 PO) Take by mouth daily.    Historical Provider, MD  fluticasone (FLONASE) 50 MCG/ACT nasal spray Place 2 sprays into both nostrils daily. Patient taking differently: Place 2 sprays into both nostrils daily as needed.  01/24/14   Brunetta Jeans, PA-C  furosemide (LASIX) 40 MG tablet TAKE 1/2 TABLET EVERY OTHER DAY 03/17/14   Mosie Lukes, MD  glucose blood (ONE TOUCH TEST STRIPS) test strip Use as instructed to check blood sugar once weekly. DX 790.29     Historical Provider, MD  losartan (COZAAR) 50 MG tablet Take 1 tablet by mouth daily.    Historical Provider, MD  mometasone-formoterol (DULERA) 200-5 MCG/ACT AERO Inhale 2 puffs into the lungs 2 (two) times daily. 03/30/14   Elsie Stain, MD  Niacin (VITAMIN B-3 PO) Take 1 tablet by mouth daily.    Historical Provider, MD  OXYGEN Inhale into the lungs.    Historical Provider, MD  pantoprazole (PROTONIX) 40 MG tablet Take 1 tablet (40 mg total) by mouth 2 (two) times daily. 05/05/14   Mosie Lukes, MD  potassium chloride SA (K-DUR,KLOR-CON) 20 MEQ tablet Take 1 tablet (20 mEq total) by mouth once. Patient taking differently: Take 20 mEq by mouth daily.  11/28/13   Mosie Lukes, MD  pravastatin (PRAVACHOL) 20 MG tablet Take 1 tablet (20 mg total) by mouth daily. 11/28/13   Mosie Lukes, MD  predniSONE (DELTASONE) 10 MG tablet Take 4 for three days 3 for three days 2 for three days 1 for three days and stop 03/30/14   Elsie Stain, MD  Probiotic Product (PROBIOTIC PO) Take by mouth daily.    Historical Provider, MD  ranitidine (ZANTAC) 300 MG tablet Take 1 tablet (300 mg total) by mouth at bedtime. 11/28/13   Mosie Lukes, MD  sertraline (ZOLOFT) 50 MG tablet Take 1 tablet (50 mg total)  by mouth daily. 08/23/13   Mosie Lukes, MD  tiotropium (SPIRIVA) 18 MCG inhalation capsule Place 1 capsule (18 mcg total) into inhaler and inhale daily. 11/28/13   Mosie Lukes, MD  traMADol (ULTRAM) 50 MG tablet Take 1 tablet (50 mg total) by mouth 3 (three) times daily as needed. 04/28/14   Mosie Lukes, MD   BP 141/72 mmHg  Pulse 80  Temp(Src) 99.6 F (37.6 C) (Oral)  Resp 18  SpO2 92% Physical Exam  Constitutional: She is oriented to person, place, and time. She appears well-developed and well-nourished. No distress.  HENT:  Head: Normocephalic and atraumatic.  Right Ear: Hearing normal.  Left Ear: Hearing normal.  Nose: Nose normal.  Mouth/Throat: Oropharynx is clear and moist and mucous membranes are normal.  Eyes: EOM are normal. Pupils are equal, round, and reactive to light. Right eye exhibits discharge. No foreign body present in the right eye. Left eye exhibits discharge. No foreign body present in the left eye. Right conjunctiva is injected. Left conjunctiva is injected.  IOP 16 OD IOP 18 OS  Fluorescein exam with Wood's lamp reveals no corneal uptake, no ulcerations, abrasions, dendritic lesions  Neck: Normal range of motion. Neck supple.  Cardiovascular: Regular rhythm, S1 normal and S2 normal.  Exam reveals no gallop and no friction rub.   No murmur heard. Pulmonary/Chest: Effort normal and breath sounds normal. No respiratory distress. She exhibits no tenderness.  Abdominal: Soft. Normal appearance and bowel sounds are normal. There is no hepatosplenomegaly. There is no tenderness. There is no rebound, no guarding, no tenderness at McBurney's point and negative Murphy's sign. No hernia.  Musculoskeletal: Normal range of motion.  Neurological: She is alert and oriented to person, place, and time. She has normal strength. No cranial nerve deficit or sensory deficit. Coordination normal. GCS eye subscore is 4. GCS verbal subscore is 5. GCS motor subscore is 6.  Skin: Skin  is warm, dry and intact. No rash noted. No cyanosis.  Psychiatric: She has a normal mood and affect. Her speech is normal and behavior is normal. Thought content normal.  Nursing note and vitals reviewed.   ED Course  Procedures (including critical care time) Labs Review Labs Reviewed - No data to display  Imaging Review Dg Chest 2 View  05/14/2014   CLINICAL DATA:  Cough, congestion  EXAM: CHEST  2 VIEW  COMPARISON:  01/12/2014  FINDINGS: Mild cardiomegaly with central vascular congestion. Prominence of the bilateral pulmonary arteries and rapid tapering may suggest pulmonary arterial hypertension. No focal pulmonary opacity. Diffusely prominent interstitial reticular opacities are noted. No pleural effusion. No acute osseous finding.  IMPRESSION: Cardiomegaly with central vascular congestion and suggestion of pulmonary arterial hypertension.   Electronically Signed   By: Conchita Paris M.D.   On: 05/14/2014 14:57     EKG Interpretation None      MDM   Final diagnoses:  Cough  Bilateral conjunctivitis    Presents to the ER for evaluation of bilateral eye swelling with drainage and crusting. Examination consistent with conjunctivitis without any corneal involvement. She does not have evidence of glaucoma. Treatment with Cipro drops, follow-up with primary doctor in 2 days.  Date of cough. She is not significant short of breath or hypoxic. Vital signs are stable. X-ray of chest did not show any acute pneumonia. Patient will be discharged, continue all her other current medications.    Orpah Greek, MD 05/14/14 [phone]

## 2014-05-14 NOTE — ED Notes (Signed)
Patient here with bilateral eye drainage with redness and itching x 1 day, also here for increased congestion and cough

## 2014-05-19 ENCOUNTER — Other Ambulatory Visit: Payer: Commercial Managed Care - HMO

## 2014-05-24 ENCOUNTER — Other Ambulatory Visit (INDEPENDENT_AMBULATORY_CARE_PROVIDER_SITE_OTHER): Payer: Commercial Managed Care - HMO

## 2014-05-24 DIAGNOSIS — R195 Other fecal abnormalities: Secondary | ICD-10-CM

## 2014-05-24 LAB — CBC
HCT: 33.2 % — ABNORMAL LOW (ref 36.0–46.0)
Hemoglobin: 11 g/dL — ABNORMAL LOW (ref 12.0–15.0)
MCHC: 33.2 g/dL (ref 30.0–36.0)
MCV: 91.5 fl (ref 78.0–100.0)
Platelets: 175 10*3/uL (ref 150.0–400.0)
RBC: 3.63 Mil/uL — ABNORMAL LOW (ref 3.87–5.11)
RDW: 14.5 % (ref 11.5–15.5)
WBC: 7 10*3/uL (ref 4.0–10.5)

## 2014-05-24 LAB — FERRITIN: Ferritin: 83.1 ng/mL (ref 10.0–291.0)

## 2014-05-24 LAB — IBC PANEL
IRON: 66 ug/dL (ref 42–145)
Saturation Ratios: 18.9 % — ABNORMAL LOW (ref 20.0–50.0)
Transferrin: 249 mg/dL (ref 212.0–360.0)

## 2014-05-24 LAB — VITAMIN B12: Vitamin B-12: 1436 pg/mL — ABNORMAL HIGH (ref 211–911)

## 2014-05-27 LAB — INTRINSIC FACTOR ANTIBODIES: Intrinsic Factor: NEGATIVE

## 2014-06-06 DIAGNOSIS — R0902 Hypoxemia: Secondary | ICD-10-CM | POA: Diagnosis not present

## 2014-06-06 DIAGNOSIS — J4520 Mild intermittent asthma, uncomplicated: Secondary | ICD-10-CM | POA: Diagnosis not present

## 2014-06-15 ENCOUNTER — Other Ambulatory Visit: Payer: Self-pay | Admitting: Family Medicine

## 2014-06-15 NOTE — Telephone Encounter (Signed)
Last refill on 04/28/14  #90 with 0 refills Last office visit 02/28/14 Next scheduled office visit 06/29/14  Advise on refill

## 2014-06-16 MED ORDER — TRAMADOL HCL 50 MG PO TABS: 50.0000 mg | ORAL_TABLET | Freq: Three times a day (TID) | ORAL | Status: DC | PRN

## 2014-06-16 NOTE — Addendum Note (Signed)
Addended by: Sharon Seller B on: 06/16/2014 07:23 AM   Modules accepted: Orders

## 2014-06-16 NOTE — Telephone Encounter (Signed)
Printed and on counter for signature. 

## 2014-06-16 NOTE — Telephone Encounter (Signed)
Faxed hardcopy for Tramadol to [place]

## 2014-06-18 ENCOUNTER — Other Ambulatory Visit: Payer: Self-pay | Admitting: Family Medicine

## 2014-06-18 NOTE — Telephone Encounter (Signed)
OK to refill her Tramadol

## 2014-06-20 NOTE — Telephone Encounter (Signed)
Faxed hardcopy for Tramadol to [place].

## 2014-06-20 NOTE — Telephone Encounter (Signed)
refaxed hardcopy as used incorrect fax number.

## 2014-06-29 ENCOUNTER — Ambulatory Visit (INDEPENDENT_AMBULATORY_CARE_PROVIDER_SITE_OTHER): Payer: Commercial Managed Care - HMO | Admitting: Family Medicine

## 2014-06-29 ENCOUNTER — Encounter: Payer: Self-pay | Admitting: Family Medicine

## 2014-06-29 VITALS — BP 114/78 | HR 107 | Temp 98.5°F | Ht 65.0 in | Wt 225.0 lb

## 2014-06-29 DIAGNOSIS — K219 Gastro-esophageal reflux disease without esophagitis: Secondary | ICD-10-CM

## 2014-06-29 DIAGNOSIS — E782 Mixed hyperlipidemia: Secondary | ICD-10-CM

## 2014-06-29 DIAGNOSIS — E559 Vitamin D deficiency, unspecified: Secondary | ICD-10-CM | POA: Diagnosis not present

## 2014-06-29 DIAGNOSIS — E118 Type 2 diabetes mellitus with unspecified complications: Secondary | ICD-10-CM | POA: Diagnosis not present

## 2014-06-29 DIAGNOSIS — E785 Hyperlipidemia, unspecified: Secondary | ICD-10-CM

## 2014-06-29 DIAGNOSIS — E119 Type 2 diabetes mellitus without complications: Secondary | ICD-10-CM

## 2014-06-29 DIAGNOSIS — M8580 Other specified disorders of bone density and structure, unspecified site: Secondary | ICD-10-CM | POA: Diagnosis not present

## 2014-06-29 DIAGNOSIS — I10 Essential (primary) hypertension: Secondary | ICD-10-CM

## 2014-06-29 DIAGNOSIS — M109 Gout, unspecified: Secondary | ICD-10-CM | POA: Diagnosis not present

## 2014-06-29 DIAGNOSIS — J309 Allergic rhinitis, unspecified: Secondary | ICD-10-CM

## 2014-06-29 MED ORDER — METOPROLOL SUCCINATE ER 25 MG PO TB24: 25.0000 mg | ORAL_TABLET | Freq: Every day | ORAL | Status: DC

## 2014-06-29 MED ORDER — LOSARTAN POTASSIUM 25 MG PO TABS: 25.0000 mg | ORAL_TABLET | Freq: Every day | ORAL | Status: DC

## 2014-06-29 MED ORDER — MOMETASONE FURO-FORMOTEROL FUM 200-5 MCG/ACT IN AERO: 2.0000 | INHALATION_SPRAY | Freq: Two times a day (BID) | RESPIRATORY_TRACT | Status: DC

## 2014-06-29 NOTE — Assessment & Plan Note (Signed)
Well controlled, no changes to meds. Encouraged heart healthy diet such as the DASH diet and exercise as tolerated.  °

## 2014-06-29 NOTE — Progress Notes (Signed)
Pre visit review using our clinic review tool, if applicable. No additional management support is needed unless otherwise documented below in the visit note. 

## 2014-06-29 NOTE — Assessment & Plan Note (Signed)
Avoid offending foods, start probiotics. Do not eat large meals in late evening and consider raising head of bed.  

## 2014-06-29 NOTE — Patient Instructions (Signed)
Start Zyrtec/Cetirizine 10 mg tablet daily   Allergies Allergies may happen from anything your body is sensitive to. This may be food, medicines, pollens, chemicals, and nearly anything around you in everyday life that produces allergens. An allergen is anything that causes an allergy producing substance. Heredity is often a factor in causing these problems. This means you may have some of the same allergies as your parents. Food allergies happen in all age groups. Food allergies are some of the most severe and life threatening. Some common food allergies are cow's milk, seafood, eggs, nuts, wheat, and soybeans. SYMPTOMS   Swelling around the mouth.  An itchy red rash or hives.  Vomiting or diarrhea.  Difficulty breathing. SEVERE ALLERGIC REACTIONS ARE LIFE-THREATENING. This reaction is called anaphylaxis. It can cause the mouth and throat to swell and cause difficulty with breathing and swallowing. In severe reactions only a trace amount of food (for example, peanut oil in a salad) may cause death within seconds. Seasonal allergies occur in all age groups. These are seasonal because they usually occur during the same season every year. They may be a reaction to molds, grass pollens, or tree pollens. Other causes of problems are house dust mite allergens, pet dander, and mold spores. The symptoms often consist of nasal congestion, a runny itchy nose associated with sneezing, and tearing itchy eyes. There is often an associated itching of the mouth and ears. The problems happen when you come in contact with pollens and other allergens. Allergens are the particles in the air that the body reacts to with an allergic reaction. This causes you to release allergic antibodies. Through a chain of events, these eventually cause you to release histamine into the blood stream. Although it is meant to be protective to the body, it is this release that causes your discomfort. This is why you were given  anti-histamines to feel better. If you are unable to pinpoint the offending allergen, it may be determined by skin or blood testing. Allergies cannot be cured but can be controlled with medicine. Hay fever is a collection of all or some of the seasonal allergy problems. It may often be treated with simple over-the-counter medicine such as diphenhydramine. Take medicine as directed. Do not drink alcohol or drive while taking this medicine. Check with your caregiver or package insert for child dosages. If these medicines are not effective, there are many new medicines your caregiver can prescribe. Stronger medicine such as nasal spray, eye drops, and corticosteroids may be used if the first things you try do not work well. Other treatments such as immunotherapy or desensitizing injections can be used if all else fails. Follow up with your caregiver if problems continue. These seasonal allergies are usually not life threatening. They are generally more of a nuisance that can often be handled using medicine. HOME CARE INSTRUCTIONS   If unsure what causes a reaction, keep a diary of foods eaten and symptoms that follow. Avoid foods that cause reactions.  If hives or rash are present:  Take medicine as directed.  You may use an over-the-counter antihistamine (diphenhydramine) for hives and itching as needed.  Apply cold compresses (cloths) to the skin or take baths in cool water. Avoid hot baths or showers. Heat will make a rash and itching worse.  If you are severely allergic:  Following a treatment for a severe reaction, hospitalization is often required for closer follow-up.  Wear a medic-alert bracelet or necklace stating the allergy.  You and your family  must learn how to give adrenaline or use an anaphylaxis kit.  If you have had a severe reaction, always carry your anaphylaxis kit or EpiPen with you. Use this medicine as directed by your caregiver if a severe reaction is occurring. Failure  to do so could have a fatal outcome. SEEK MEDICAL CARE IF:  You suspect a food allergy. Symptoms generally happen within 30 minutes of eating a food.  Your symptoms have not gone away within 2 days or are getting worse.  You develop new symptoms.  You want to retest yourself or your child with a food or drink you think causes an allergic reaction. Never do this if an anaphylactic reaction to that food or drink has happened before. Only do this under the care of a caregiver. SEEK IMMEDIATE MEDICAL CARE IF:   You have difficulty breathing, are wheezing, or have a tight feeling in your chest or throat.  You have a swollen mouth, or you have hives, swelling, or itching all over your body.  You have had a severe reaction that has responded to your anaphylaxis kit or an EpiPen. These reactions may return when the medicine has worn off. These reactions should be considered life threatening. MAKE SURE YOU:   Understand these instructions.  Will watch your condition.  Will get help right away if you are not doing well or get worse. Document Released: 04/01/2002 Document Revised: 05/03/2012 Document Reviewed: 09/06/2007 [facility] Patient Information 2015 [place], [place]. This information is not intended to replace advice given to you by your health care provider. Make sure you discuss any questions you have with your health care provider.

## 2014-06-29 NOTE — Progress Notes (Signed)
[name]  [id number] 1940-04-29 06/29/2014      Progress Note-Follow Up  Subjective  Chief Complaint  Chief Complaint  Patient presents with  . Follow-up    4 month    HPI  Patient is a 74 y.o. female in today for routine medical care.  Patient is here today for follow-up generally doing well. No recent acute illness. Reports blood sugars have been well-controlled and staying below 150. Denies polyuria or polydipsia. No flares in any respiratory concerns. No recent gouty joints. Denies CP/palp/SOB/HA/congestion/fevers/GI or GU c/o. Taking meds as prescribed  Past Medical History  Diagnosis Date  . Asthma     PFT 02/06/09 FEV1 1.41 (65%), FVC 1.92 (64), FEV1% 74, TLC 3.47 (71%), DLCO 48%, +BD  . ACE-inhibitor cough   . DVT (deep venous thrombosis) 1987  . GERD (gastroesophageal reflux disease)   . Arthritis   . Atypical chest pain     s/p cath, Normal coronaries, Non ST elevation myocardial infarction, Rt groin pseudoaneurysm  . Hyperlipidemia   . Hypertension   . Depression   . Gout   . Abnormal glucose tolerance test   . Sun-damaged skin 10/24/2012  . Urinary frequency 10/24/2012  . Unspecified constipation 06/05/2013  . UTI (lower urinary tract infection) 10/24/2012  . Pneumonia   . COPD (chronic obstructive pulmonary disease)   . Anemia 03/12/2014  . Bacterial vaginosis 03/12/2014    Past Surgical History  Procedure Laterality Date  . Appendectomy  1951  . Tubal ligation  1968  . Tonsillectomy  1950    Family History  Problem Relation Age of Onset  . Asthma Sister   . Arthritis Sister   . Hypertension Sister   . Hyperlipidemia Sister   . Cancer Sister 26    uterine  . Uterine cancer Sister   . Coronary artery disease Brother     x2  . Cancer Brother     lung, smoker  . Arthritis Brother   . Coronary artery disease Other   . Hypertension Sister   . Arthritis Sister   . Hyperlipidemia Sister   . Emphysema Sister   . COPD Sister     smoker  .  Heart disease Sister   . Hyperlipidemia Sister   . Hypertension Sister   . Arthritis Sister   . Emphysema Brother   . Heart disease Brother     MI smoker  . Colon polyps Sister   . Mental illness Father   . Alcohol abuse Father   . Heart disease Mother     MI  . Hyperlipidemia Mother   . Epilepsy Daughter   . Hypertension Daughter   . Obesity Daughter   . COPD Brother     smoker  . Cancer Brother     lung    History   Social History  . Marital Status: Widowed    Spouse Name: N/A  . Number of Children: 1  . Years of Education: N/A   Occupational History  . Retired    Social History Main Topics  . Smoking status: Never Smoker   . Smokeless tobacco: Never Used  . Alcohol Use: No  . Drug Use: No  . Sexual Activity: No     Comment: lives alone, no dietary restrictions   Other Topics Concern  . Not on file   Social History Narrative    Current Outpatient Prescriptions on File Prior to Visit  Medication Sig Dispense Refill  . acetaminophen (TYLENOL) 500 MG tablet  Take 500 mg by mouth every 6 (six) hours as needed.    Marland Kitchen albuterol (ACCUNEB) 0.63 MG/3ML nebulizer solution USE ONE VIAL IN NEBULIZER EVERY 6 HOURS AS NEEDED 150 mL 1  . albuterol (PROAIR HFA) 108 (90 BASE) MCG/ACT inhaler Inhale 2 puffs into the lungs every 4 (four) hours as needed. 3 Inhaler 3  . alendronate (FOSAMAX) 70 MG tablet Take 1 tablet (70 mg total) by mouth every 7 (seven) days. Take with a full glass of water on an empty stomach. 12 tablet 1  . allopurinol (ZYLOPRIM) 100 MG tablet TAKE TWO TABLETS BY MOUTH ONCE DAILY 60 tablet 6  . b complex vitamins tablet Take 1 tablet by mouth daily.    Marland Kitchen conjugated estrogens (PREMARIN) vaginal cream Place 1 Applicatorful vaginally daily. (Patient taking differently: Place 1 Applicatorful vaginally 3 (three) times a week. ) 42.5 g 12  . Cyanocobalamin (VITAMIN B-12 PO) Take by mouth daily.    . fluticasone (FLONASE) 50 MCG/ACT nasal spray Place 2 sprays into  both nostrils daily. (Patient taking differently: Place 2 sprays into both nostrils daily as needed. ) 16 g 6  . furosemide (LASIX) 40 MG tablet TAKE 1/2 TABLET EVERY OTHER DAY 23 tablet 6  . glucose blood (ONE TOUCH TEST STRIPS) test strip Use as instructed to check blood sugar once weekly. DX 790.29     . losartan (COZAAR) 50 MG tablet Take 1 tablet by mouth daily.    . Niacin (VITAMIN B-3 PO) Take 1 tablet by mouth daily.    . OXYGEN Inhale into the lungs.    . pantoprazole (PROTONIX) 40 MG tablet Take 1 tablet (40 mg total) by mouth 2 (two) times daily. 60 tablet 6  . potassium chloride SA (K-DUR,KLOR-CON) 20 MEQ tablet Take 1 tablet (20 mEq total) by mouth once. (Patient taking differently: Take 20 mEq by mouth daily. ) 90 tablet 3  . pravastatin (PRAVACHOL) 20 MG tablet Take 1 tablet (20 mg total) by mouth daily. 90 tablet 3  . Probiotic Product (PROBIOTIC PO) Take by mouth daily.    . ranitidine (ZANTAC) 300 MG tablet Take 1 tablet (300 mg total) by mouth at bedtime. 90 tablet 3  . sertraline (ZOLOFT) 50 MG tablet Take 1 tablet (50 mg total) by mouth daily. 90 tablet 3  . tiotropium (SPIRIVA) 18 MCG inhalation capsule Place 1 capsule (18 mcg total) into inhaler and inhale daily. 90 capsule 3  . traMADol (ULTRAM) 50 MG tablet TAKE ONE TABLET BY MOUTH THREE TIMES DAILY AS NEEDED 90 tablet 0   No current facility-administered medications on file prior to visit.    Allergies  Allergen Reactions  . Ace Inhibitors   . Indomethacin   . Montelukast Sodium     REACTION: Heart palpitations, chest pain  . Oseltamivir Phosphate     REACTION: nausea, vomiting, diarrhea, dizziness  . Sulfa Antibiotics Other (See Comments)    CP  . Sulfonamide Derivatives     Review of Systems  Review of Systems  Constitutional: Negative for fever and malaise/fatigue.  HENT: Negative for congestion.   Eyes: Negative for discharge.  Respiratory: Negative for shortness of breath.   Cardiovascular:  Positive for palpitations. Negative for chest pain and leg swelling.  Gastrointestinal: Negative for nausea, abdominal pain and diarrhea.  Genitourinary: Negative for dysuria.  Musculoskeletal: Negative for falls.  Skin: Negative for rash.  Neurological: Negative for loss of consciousness and headaches.  Endo/Heme/Allergies: Negative for polydipsia.  Psychiatric/Behavioral: Negative for depression and  suicidal ideas. The patient is not nervous/anxious and does not have insomnia.     Objective  BP 114/78 mmHg  Pulse 107  Temp(Src) 98.5 F (36.9 C) (Oral)  Ht 5\' 5"  (1.651 m)  Wt 225 lb (102.059 kg)  BMI 37.44 kg/m2  SpO2 95%  Physical Exam  Physical Exam  Constitutional: She is oriented to person, place, and time and well-developed, well-nourished, and in no distress. No distress.  HENT:  Head: Normocephalic and atraumatic.  Eyes: Conjunctivae are normal.  Neck: Neck supple. No thyromegaly present.  Cardiovascular: Normal rate, regular rhythm and normal heart sounds.   No murmur heard. Pulmonary/Chest: Effort normal and breath sounds normal. She has no wheezes.  Abdominal: She exhibits no distension and no mass.  Musculoskeletal: She exhibits no edema.  Lymphadenopathy:    She has no cervical adenopathy.  Neurological: She is alert and oriented to person, place, and time.  Skin: Skin is warm and dry. No rash noted. She is not diaphoretic.  Psychiatric: Memory, affect and judgment normal.    Lab Results  Component Value Date   TSH 2.50 02/28/2014   Lab Results  Component Value Date   WBC 7.0 05/24/2014   HGB 11.0* 05/24/2014   HCT 33.2* 05/24/2014   MCV 91.5 05/24/2014   PLT 175.0 05/24/2014   Lab Results  Component Value Date   CREATININE 1.04 02/28/2014   BUN 20 02/28/2014   NA 138 02/28/2014   K 4.7 02/28/2014   CL 106 02/28/2014   CO2 27 02/28/2014   Lab Results  Component Value Date   ALT 13 02/28/2014   AST 16 02/28/2014   ALKPHOS 42 02/28/2014    BILITOT 0.6 02/28/2014   Lab Results  Component Value Date   CHOL 137 02/28/2014   Lab Results  Component Value Date   HDL 55.50 02/28/2014   Lab Results  Component Value Date   LDLCALC 64 02/28/2014   Lab Results  Component Value Date   TRIG 86.0 02/28/2014   Lab Results  Component Value Date   CHOLHDL 2 02/28/2014     Assessment & Plan  Essential hypertension Well controlled, no changes to meds. Encouraged heart healthy diet such as the DASH diet and exercise as tolerated.   GERD Avoid offending foods, start probiotics. Do not eat large meals in late evening and consider raising head of bed.   Allergic rhinitis Encouraged daily flonase, add Cetirzine daily  Diabetes type 2, controlled hgba1c acceptable, minimize simple carbs. Increase exercise as tolerated. Continue current meds  Hyperlipidemia, mixed Tolerating statin, encouraged heart healthy diet, avoid trans fats, minimize simple carbs and saturated fats. Increase exercise as tolerated  Gout Tolerating Allopurinol. No concerning symptoms

## 2014-06-29 NOTE — Assessment & Plan Note (Signed)
Encouraged daily flonase, add Cetirzine daily

## 2014-06-30 LAB — LIPID PANEL
CHOL/HDL RATIO: 3
Cholesterol: 158 mg/dL (ref 0–200)
HDL: 52.6 mg/dL (ref >39.00)
LDL Cholesterol: 76 mg/dL (ref 0–99)
NONHDL: 105.4
Triglycerides: 145 mg/dL (ref 0.0–149.0)
VLDL: 29 mg/dL (ref 0.0–40.0)

## 2014-06-30 LAB — CBC
HCT: 33 % — ABNORMAL LOW (ref 36.0–46.0)
HEMOGLOBIN: 10.9 g/dL — AB (ref 12.0–15.0)
MCHC: 33 g/dL (ref 30.0–36.0)
MCV: 92.2 fl (ref 78.0–100.0)
Platelets: 160 10*3/uL (ref 150.0–400.0)
RBC: 3.59 Mil/uL — ABNORMAL LOW (ref 3.87–5.11)
RDW: 14.5 % (ref 11.5–15.5)
WBC: 6.3 10*3/uL (ref 4.0–10.5)

## 2014-06-30 LAB — URIC ACID: Uric Acid, Serum: 6.1 mg/dL (ref 2.4–7.0)

## 2014-06-30 LAB — HEMOGLOBIN A1C: Hgb A1c MFr Bld: 5.8 % (ref 4.6–6.5)

## 2014-06-30 LAB — TSH: TSH: 2.31 u[IU]/mL (ref 0.35–4.50)

## 2014-07-03 ENCOUNTER — Ambulatory Visit (HOSPITAL_BASED_OUTPATIENT_CLINIC_OR_DEPARTMENT_OTHER)
Admission: RE | Admit: 2014-07-03 | Discharge: 2014-07-03 | Disposition: A | Discharge status: Home / Self Care | Payer: Commercial Managed Care - HMO | Source: Ambulatory Visit | Attending: Family Medicine | Admitting: Family Medicine

## 2014-07-03 DIAGNOSIS — M8580 Other specified disorders of bone density and structure, unspecified site: Secondary | ICD-10-CM | POA: Insufficient documentation

## 2014-07-03 DIAGNOSIS — M8589 Other specified disorders of bone density and structure, multiple sites: Secondary | ICD-10-CM | POA: Diagnosis not present

## 2014-07-03 LAB — VITAMIN D 1,25 DIHYDROXY
VITAMIN D3 1, 25 (OH): 42 pg/mL
Vitamin D 1, 25 (OH)2 Total: 42 pg/mL (ref 18–72)

## 2014-07-06 ENCOUNTER — Other Ambulatory Visit: Payer: Self-pay | Admitting: Family Medicine

## 2014-07-07 DIAGNOSIS — R0902 Hypoxemia: Secondary | ICD-10-CM | POA: Diagnosis not present

## 2014-07-07 DIAGNOSIS — J4520 Mild intermittent asthma, uncomplicated: Secondary | ICD-10-CM | POA: Diagnosis not present

## 2014-07-09 NOTE — Assessment & Plan Note (Signed)
hgba1c acceptable, minimize simple carbs. Increase exercise as tolerated. Continue current meds 

## 2014-07-09 NOTE — Assessment & Plan Note (Signed)
Tolerating Allopurinol. No concerning symptoms

## 2014-07-09 NOTE — Assessment & Plan Note (Signed)
Tolerating statin, encouraged heart healthy diet, avoid trans fats, minimize simple carbs and saturated fats. Increase exercise as tolerated 

## 2014-08-06 DIAGNOSIS — R0902 Hypoxemia: Secondary | ICD-10-CM | POA: Diagnosis not present

## 2014-08-06 DIAGNOSIS — J4520 Mild intermittent asthma, uncomplicated: Secondary | ICD-10-CM | POA: Diagnosis not present

## 2014-08-09 ENCOUNTER — Other Ambulatory Visit: Payer: Self-pay | Admitting: Family Medicine

## 2014-08-09 NOTE — Telephone Encounter (Signed)
PCP printed and on counter for signature.  Will be faxed to [facility] on 08/09/14 when PCP return to the office.

## 2014-08-09 NOTE — Telephone Encounter (Signed)
Requesting:  TRAMADOL Contract   SIGNED 01/23/14 UDS   NONE Last OV   06/29/14 Last Refill   #90 06/20/14  Please Advise

## 2014-08-11 ENCOUNTER — Other Ambulatory Visit: Payer: Self-pay | Admitting: Family Medicine

## 2014-08-12 NOTE — Telephone Encounter (Signed)
OK to fill her Tramadol

## 2014-08-14 NOTE — Telephone Encounter (Signed)
Rx printed, MD signed, and patient notified that Rs faxed to [facility] by voicemail.

## 2014-09-06 DIAGNOSIS — R0902 Hypoxemia: Secondary | ICD-10-CM | POA: Diagnosis not present

## 2014-09-06 DIAGNOSIS — J4520 Mild intermittent asthma, uncomplicated: Secondary | ICD-10-CM | POA: Diagnosis not present

## 2014-09-11 DIAGNOSIS — H52223 Regular astigmatism, bilateral: Secondary | ICD-10-CM | POA: Diagnosis not present

## 2014-09-11 DIAGNOSIS — H18413 Arcus senilis, bilateral: Secondary | ICD-10-CM | POA: Diagnosis not present

## 2014-09-11 DIAGNOSIS — H524 Presbyopia: Secondary | ICD-10-CM | POA: Diagnosis not present

## 2014-09-11 DIAGNOSIS — H3531 Nonexudative age-related macular degeneration: Secondary | ICD-10-CM | POA: Diagnosis not present

## 2014-09-11 DIAGNOSIS — H5211 Myopia, right eye: Secondary | ICD-10-CM | POA: Diagnosis not present

## 2014-09-11 DIAGNOSIS — H43811 Vitreous degeneration, right eye: Secondary | ICD-10-CM | POA: Diagnosis not present

## 2014-09-11 DIAGNOSIS — H11153 Pinguecula, bilateral: Secondary | ICD-10-CM | POA: Diagnosis not present

## 2014-09-11 DIAGNOSIS — H353131 Nonexudative age-related macular degeneration, bilateral, early dry stage: Secondary | ICD-10-CM | POA: Diagnosis not present

## 2014-09-11 DIAGNOSIS — H5202 Hypermetropia, left eye: Secondary | ICD-10-CM | POA: Diagnosis not present

## 2014-09-11 DIAGNOSIS — E119 Type 2 diabetes mellitus without complications: Secondary | ICD-10-CM | POA: Diagnosis not present

## 2014-09-11 DIAGNOSIS — Z9849 Cataract extraction status, unspecified eye: Secondary | ICD-10-CM | POA: Diagnosis not present

## 2014-09-21 ENCOUNTER — Telehealth: Payer: Self-pay | Admitting: Family Medicine

## 2014-09-21 NOTE — Telephone Encounter (Signed)
Please find out which company she uses then check with them and find out what they need from [place] to order her a new unit. THX

## 2014-09-21 NOTE — Telephone Encounter (Signed)
Caller name: Macala Prokosch Relationship to patient: Self  Can be reached: [phone] Pharmacy:  Reason for call: pt says that the [facility] that she gets her oxygen machine from now have battery operated kind. Pt would like a Rx to get one of those instead.

## 2014-09-22 NOTE — Telephone Encounter (Signed)
Pt returning your call. Best # [phone].

## 2014-09-22 NOTE — Telephone Encounter (Signed)
Please let patient know I am happy to write her for this but really need a new visit to document needs for insurance purposes. Then we can get her new concentrator

## 2014-09-22 NOTE — Telephone Encounter (Signed)
Called left the patient a detailed message of PCP's request and to call back with detailed information needed in order to complete this request.

## 2014-09-22 NOTE — Telephone Encounter (Signed)
I called the patient and she gets her oxygen from [facility] [phone]). I have called [facility].  They stated the patient is requesting a POC (Portable Oxygen Concentrator). She stated they could accept your last office notes, must be amended though as would need to state in the notes that patient had a need for this type of equipment.  The patient stated this would be more convenient for her as she would not have to carry a tank and could charge the battery at home/car cigarette lighter. The representative did state they may request that the patient come back in to ascess the need before she can qualify for this. Contact at [place], [phone] and fax number is [phone]  Advise

## 2014-09-26 ENCOUNTER — Other Ambulatory Visit: Payer: Self-pay | Admitting: Family Medicine

## 2014-09-26 NOTE — Telephone Encounter (Signed)
Patient informed of need for appt. And agreed to do so.

## 2014-10-02 ENCOUNTER — Encounter: Payer: Self-pay | Admitting: Family Medicine

## 2014-10-02 ENCOUNTER — Ambulatory Visit (INDEPENDENT_AMBULATORY_CARE_PROVIDER_SITE_OTHER): Payer: Commercial Managed Care - HMO | Admitting: Family Medicine

## 2014-10-02 VITALS — BP 130/80 | HR 93 | Temp 98.3°F | Resp 18 | Ht 64.0 in | Wt 218.8 lb

## 2014-10-02 DIAGNOSIS — E782 Mixed hyperlipidemia: Secondary | ICD-10-CM

## 2014-10-02 DIAGNOSIS — M109 Gout, unspecified: Secondary | ICD-10-CM

## 2014-10-02 DIAGNOSIS — M8580 Other specified disorders of bone density and structure, unspecified site: Secondary | ICD-10-CM

## 2014-10-02 DIAGNOSIS — R000 Tachycardia, unspecified: Secondary | ICD-10-CM

## 2014-10-02 DIAGNOSIS — R0902 Hypoxemia: Secondary | ICD-10-CM

## 2014-10-02 DIAGNOSIS — M10079 Idiopathic gout, unspecified ankle and foot: Secondary | ICD-10-CM

## 2014-10-02 DIAGNOSIS — I10 Essential (primary) hypertension: Secondary | ICD-10-CM

## 2014-10-02 DIAGNOSIS — M899 Disorder of bone, unspecified: Secondary | ICD-10-CM | POA: Diagnosis not present

## 2014-10-02 DIAGNOSIS — Z23 Encounter for immunization: Secondary | ICD-10-CM | POA: Diagnosis not present

## 2014-10-02 DIAGNOSIS — E119 Type 2 diabetes mellitus without complications: Secondary | ICD-10-CM

## 2014-10-02 DIAGNOSIS — D649 Anemia, unspecified: Secondary | ICD-10-CM

## 2014-10-02 DIAGNOSIS — K5900 Constipation, unspecified: Secondary | ICD-10-CM | POA: Diagnosis not present

## 2014-10-02 DIAGNOSIS — K219 Gastro-esophageal reflux disease without esophagitis: Secondary | ICD-10-CM

## 2014-10-02 DIAGNOSIS — M949 Disorder of cartilage, unspecified: Secondary | ICD-10-CM

## 2014-10-02 LAB — COMPREHENSIVE METABOLIC PANEL
ALK PHOS: 46 U/L (ref 39–117)
ALT: 11 U/L (ref 0–35)
AST: 13 U/L (ref 0–37)
Albumin: 4.1 g/dL (ref 3.5–5.2)
BILIRUBIN TOTAL: 0.5 mg/dL (ref 0.2–1.2)
BUN: 22 mg/dL (ref 6–23)
CO2: 32 meq/L (ref 19–32)
Calcium: 10.6 mg/dL — ABNORMAL HIGH (ref 8.4–10.5)
Chloride: 103 mEq/L (ref 96–112)
Creatinine, Ser: 1.2 mg/dL (ref 0.40–1.20)
GFR: 46.74 mL/min — ABNORMAL LOW (ref >60.00)
GLUCOSE: 115 mg/dL — AB (ref 70–99)
POTASSIUM: 4.4 meq/L (ref 3.5–5.1)
SODIUM: 143 meq/L (ref 135–145)
TOTAL PROTEIN: 7.3 g/dL (ref 6.0–8.3)

## 2014-10-02 LAB — CBC
HEMATOCRIT: 34.9 % — AB (ref 36.0–46.0)
Hemoglobin: 11.5 g/dL — ABNORMAL LOW (ref 12.0–15.0)
MCHC: 32.9 g/dL (ref 30.0–36.0)
MCV: 92.5 fl (ref 78.0–100.0)
Platelets: 147 10*3/uL — ABNORMAL LOW (ref 150.0–400.0)
RBC: 3.77 Mil/uL — ABNORMAL LOW (ref 3.87–5.11)
RDW: 15 % (ref 11.5–15.5)
WBC: 6.6 10*3/uL (ref 4.0–10.5)

## 2014-10-02 LAB — TSH: TSH: 3.48 u[IU]/mL (ref 0.35–4.50)

## 2014-10-02 LAB — IBC PANEL
Iron: 75 ug/dL (ref 42–145)
SATURATION RATIOS: 20.1 % (ref 20.0–50.0)
Transferrin: 266 mg/dL (ref 212.0–360.0)

## 2014-10-02 LAB — LIPID PANEL
CHOL/HDL RATIO: 3
Cholesterol: 158 mg/dL (ref 0–200)
HDL: 48.2 mg/dL (ref >39.00)
LDL Cholesterol: 79 mg/dL (ref 0–99)
NONHDL: 109.54
Triglycerides: 152 mg/dL — ABNORMAL HIGH (ref 0.0–149.0)
VLDL: 30.4 mg/dL (ref 0.0–40.0)

## 2014-10-02 LAB — HEMOGLOBIN A1C: HEMOGLOBIN A1C: 5.9 % (ref 4.6–6.5)

## 2014-10-02 LAB — URIC ACID: Uric Acid, Serum: 5.1 mg/dL (ref 2.4–7.0)

## 2014-10-02 LAB — VITAMIN D 25 HYDROXY (VIT D DEFICIENCY, FRACTURES): VITD: 34.33 ng/mL (ref 30.00–100.00)

## 2014-10-02 MED ORDER — SERTRALINE HCL 50 MG PO TABS: 50.0000 mg | ORAL_TABLET | Freq: Every day | ORAL | Status: DC

## 2014-10-02 NOTE — Progress Notes (Signed)
Pre visit review using our clinic review tool, if applicable. No additional management support is needed unless otherwise documented below in the visit note. 

## 2014-10-02 NOTE — Patient Instructions (Signed)
Encouraged increased hydration and fiber in diet. Daily probiotics. If bowels not moving can use MOM 2 tbls po in 4 oz of warm prune juice by mouth every 2-3 days. If no results then repeat in 4 hours with  Dulcolax suppository pr, may repeat again in 4 more hours as needed. Seek care if symptoms worsen. Consider daily Miralax and/or Dulcolax if symptoms persist.  Magnesium 200 mg daily  And fiber supplement such as Metamucil, Citracel, Benefiber daily or twice daily and 64 oz of fluids Docusate 100 to 200 mg daily is acceptable Can Miralax/Glycolax daily  Constipation Constipation is when a person has fewer than three bowel movements a week, has difficulty having a bowel movement, or has stools that are dry, hard, or larger than normal. As people grow older, constipation is more common. If you try to fix constipation with medicines that make you have a bowel movement (laxatives), the problem may get worse. Long-term laxative use may cause the muscles of the colon to become weak. A low-fiber diet, not taking in enough fluids, and taking certain medicines may make constipation worse.  CAUSES   Certain medicines, such as antidepressants, pain medicine, iron supplements, antacids, and water pills.   Certain diseases, such as diabetes, irritable bowel syndrome (IBS), thyroid disease, or depression.   Not drinking enough water.   Not eating enough fiber-rich foods.   Stress or travel.   Lack of physical activity or exercise.   Ignoring the urge to have a bowel movement.   Using laxatives too much.  SIGNS AND SYMPTOMS   Having fewer than three bowel movements a week.   Straining to have a bowel movement.   Having stools that are hard, dry, or larger than normal.   Feeling full or bloated.   Pain in the lower abdomen.   Not feeling relief after having a bowel movement.  DIAGNOSIS  Your health care provider will take a medical history and perform a physical exam. Further  testing may be done for severe constipation. Some tests may include:  A barium enema X-ray to examine your rectum, colon, and, sometimes, your small intestine.   A sigmoidoscopy to examine your lower colon.   A colonoscopy to examine your entire colon. TREATMENT  Treatment will depend on the severity of your constipation and what is causing it. Some dietary treatments include drinking more fluids and eating more fiber-rich foods. Lifestyle treatments may include regular exercise. If these diet and lifestyle recommendations do not help, your health care provider may recommend taking over-the-counter laxative medicines to help you have bowel movements. Prescription medicines may be prescribed if over-the-counter medicines do not work.  HOME CARE INSTRUCTIONS   Eat foods that have a lot of fiber, such as fruits, vegetables, whole grains, and beans.  Limit foods high in fat and processed sugars, such as french fries, hamburgers, cookies, candies, and soda.   A fiber supplement may be added to your diet if you cannot get enough fiber from foods.   Drink enough fluids to keep your urine clear or pale yellow.   Exercise regularly or as directed by your health care provider.   Go to the restroom when you have the urge to go. Do not hold it.   Only take over-the-counter or prescription medicines as directed by your health care provider. Do not take other medicines for constipation without talking to your health care provider first.  [place] IF:   You have bright red blood in your  stool.   Your constipation lasts for more than 4 days or gets worse.   You have abdominal or rectal pain.   You have thin, pencil-like stools.   You have unexplained weight loss. MAKE SURE YOU:   Understand these instructions.  Will watch your condition.  Will get help right away if you are not doing well or get worse. Document Released: 10/05/2003 Document Revised: 01/11/2013  Document Reviewed: 10/18/2012 [facility] Patient Information 2015 [place], [place]. This information is not intended to replace advice given to you by your health care provider. Make sure you discuss any questions you have with your health care provider.

## 2014-10-04 ENCOUNTER — Telehealth: Payer: Self-pay | Admitting: Family Medicine

## 2014-10-04 DIAGNOSIS — J4541 Moderate persistent asthma with (acute) exacerbation: Secondary | ICD-10-CM

## 2014-10-04 NOTE — Telephone Encounter (Signed)
Caller name: Thresa Roda   Relationship to patient: Self   Can be reached: [phone] Pharmacy:  Reason for call: pt says that her and Dr. B discussed her getting a portable oxygen machine. She says that [facility] says that Dr. B has to okay it with them. She provided phone number : [phone].

## 2014-10-04 NOTE — Telephone Encounter (Signed)
Attempted calling [employer] but got the run around with who to call will attempt again.

## 2014-10-04 NOTE — Telephone Encounter (Signed)
Spoke with pt and she voices understanding.  

## 2014-10-04 NOTE — Telephone Encounter (Signed)
Pt called back for labs. Advised her VM is full. Pt states to call back same #.

## 2014-10-06 NOTE — Telephone Encounter (Signed)
Pt would like a portable oxygen machine. Order placed please sign and approve if acceptable. Thank you.

## 2014-10-06 NOTE — Telephone Encounter (Signed)
I am willing to write her for the portable oxygen concentrator. Have we been able to figure out what [employer] needs from me?

## 2014-10-07 DIAGNOSIS — R0902 Hypoxemia: Secondary | ICD-10-CM | POA: Diagnosis not present

## 2014-10-07 DIAGNOSIS — J4520 Mild intermittent asthma, uncomplicated: Secondary | ICD-10-CM | POA: Diagnosis not present

## 2014-10-08 LAB — HM DIABETES EYE EXAM

## 2014-10-10 NOTE — Telephone Encounter (Signed)
Printed rx will fax.

## 2014-10-11 NOTE — Telephone Encounter (Signed)
Patient spoke with [employer] today and insurance states Rx was not received. Patient would like a follow up call regarding status.

## 2014-10-15 ENCOUNTER — Encounter: Payer: Self-pay | Admitting: Family Medicine

## 2014-10-15 DIAGNOSIS — R0902 Hypoxemia: Secondary | ICD-10-CM

## 2014-10-15 HISTORY — DX: Hypercalcemia: E83.52

## 2014-10-15 HISTORY — DX: Hypoxemia: R09.02

## 2014-10-15 NOTE — Assessment & Plan Note (Signed)
Well controlled, no changes to meds. Encouraged heart healthy diet such as the DASH diet and exercise as tolerated.  °

## 2014-10-15 NOTE — Assessment & Plan Note (Signed)
Mild, vitamin B12 was mildly hi on last check will continue to monitor. Increase leafy greens, consider increased lean red meat and using cast iron cookware. Continue to monitor, report any concerns

## 2014-10-15 NOTE — Assessment & Plan Note (Signed)
Avoid offending foods, start probiotics. Do not eat large meals in late evening and consider raising head of bed.  

## 2014-10-15 NOTE — Assessment & Plan Note (Signed)
Patient is using supplemental O2 with good results, continue the same.

## 2014-10-15 NOTE — Assessment & Plan Note (Signed)
Uric acid WNL today

## 2014-10-15 NOTE — Assessment & Plan Note (Signed)
hgba1c acceptable, minimize simple carbs. Increase exercise as tolerated. Continue current meds 

## 2014-10-15 NOTE — Assessment & Plan Note (Signed)
Tolerating statin, encouraged heart healthy diet, avoid trans fats, minimize simple carbs and saturated fats. Increase exercise as tolerated 

## 2014-10-15 NOTE — Assessment & Plan Note (Signed)
Mild, vitamin D normal. Encouraged to minimize in diet and recheck calcium in labs with PTH with next blood draw.

## 2014-10-15 NOTE — Progress Notes (Signed)
Subjective:    Patient ID: [name], female    DOB: [birth year], 74 y.o.   MRN: [id number]  Chief Complaint  Patient presents with  . Discuss Oxygen    would like the kind that recharges so you do not have to carry with     HPI Patient is in today for follow-up. Overall feeling well. Notes oxygen has helped her dyspnea and her energy. She's had no recent illness or hospitalizations. Offers no new acute concerns today. Denies CP/palp/HA/congestion/fevers/GI or GU c/o. Taking meds as prescribed  Past Medical History  Diagnosis Date  . Asthma     PFT 02/06/09 FEV1 1.41 (65%), FVC 1.92 (64), FEV1% 74, TLC 3.47 (71%), DLCO 48%, +BD  . ACE-inhibitor cough   . DVT (deep venous thrombosis) 1987  . GERD (gastroesophageal reflux disease)   . Arthritis   . Atypical chest pain     s/p cath, Normal coronaries, Non ST elevation myocardial infarction, Rt groin pseudoaneurysm  . Hyperlipidemia   . Hypertension   . Depression   . Gout   . Abnormal glucose tolerance test   . Sun-damaged skin 10/24/2012  . Urinary frequency 10/24/2012  . Unspecified constipation 06/05/2013  . UTI (lower urinary tract infection) 10/24/2012  . Pneumonia   . COPD (chronic obstructive pulmonary disease)   . Anemia 03/12/2014  . Bacterial vaginosis 03/12/2014  . Hypercalcemia 10/15/2014  . Hypoxia 10/15/2014    Past Surgical History  Procedure Laterality Date  . Appendectomy  1951  . Tubal ligation  1968  . Tonsillectomy  1950    Family History  Problem Relation Age of Onset  . Asthma Sister   . Arthritis Sister   . Hypertension Sister   . Hyperlipidemia Sister   . Cancer Sister 47    uterine  . Uterine cancer Sister   . Coronary artery disease Brother     x2  . Cancer Brother     lung, smoker  . Arthritis Brother   . Coronary artery disease Other   . Hypertension Sister   . Arthritis Sister   . Hyperlipidemia Sister   . Emphysema Sister   . COPD Sister     smoker  . Heart disease Sister     . Hyperlipidemia Sister   . Hypertension Sister   . Arthritis Sister   . Emphysema Brother   . Heart disease Brother     MI smoker  . Colon polyps Sister   . Mental illness Father   . Alcohol abuse Father   . Heart disease Mother     MI  . Hyperlipidemia Mother   . Epilepsy Daughter   . Hypertension Daughter   . Obesity Daughter   . COPD Brother     smoker  . Cancer Brother     lung    Social History   Social History  . Marital Status: Widowed    Spouse Name: N/A  . Number of Children: 1  . Years of Education: N/A   Occupational History  . Retired    Social History Main Topics  . Smoking status: Never Smoker   . Smokeless tobacco: Never Used  . Alcohol Use: No  . Drug Use: No  . Sexual Activity: No     Comment: lives alone, no dietary restrictions   Other Topics Concern  . Not on file   Social History Narrative    Outpatient Prescriptions Prior to Visit  Medication Sig Dispense Refill  . acetaminophen (TYLENOL) 500  MG tablet Take 500 mg by mouth every 6 (six) hours as needed.    Marland Kitchen albuterol (ACCUNEB) 0.63 MG/3ML nebulizer solution USE ONE VIAL IN NEBULIZER EVERY 6 HOURS AS NEEDED 150 mL 1  . albuterol (PROAIR HFA) 108 (90 BASE) MCG/ACT inhaler Inhale 2 puffs into the lungs every 4 (four) hours as needed. 3 Inhaler 3  . allopurinol (ZYLOPRIM) 100 MG tablet TAKE TWO TABLETS BY MOUTH ONCE DAILY 60 tablet 6  . b complex vitamins tablet Take 1 tablet by mouth daily.    Marland Kitchen conjugated estrogens (PREMARIN) vaginal cream Place 1 Applicatorful vaginally daily. (Patient taking differently: Place 1 Applicatorful vaginally 3 (three) times a week. ) 42.5 g 12  . Cyanocobalamin (VITAMIN B-12 PO) Take by mouth daily.    . fluticasone (FLONASE) 50 MCG/ACT nasal spray Place 2 sprays into both nostrils daily. (Patient taking differently: Place 2 sprays into both nostrils daily as needed. ) 16 g 6  . furosemide (LASIX) 40 MG tablet TAKE 1/2 TABLET EVERY OTHER DAY 23 tablet 6  .  glucose blood (ONE TOUCH TEST STRIPS) test strip Use as instructed to check blood sugar once weekly. DX 790.29     . losartan (COZAAR) 25 MG tablet Take 1 tablet (25 mg total) by mouth daily. 30 tablet 3  . metoprolol succinate (TOPROL-XL) 25 MG 24 hr tablet Take 1 tablet (25 mg total) by mouth daily. 30 tablet 3  . mometasone-formoterol (DULERA) 200-5 MCG/ACT AERO Inhale 2 puffs into the lungs 2 (two) times daily. 1 Inhaler 11  . Niacin (VITAMIN B-3 PO) Take 1 tablet by mouth daily.    . OXYGEN Inhale into the lungs.    . pantoprazole (PROTONIX) 40 MG tablet Take 1 tablet (40 mg total) by mouth 2 (two) times daily. 60 tablet 6  . potassium chloride SA (K-DUR,KLOR-CON) 20 MEQ tablet Take 1 tablet (20 mEq total) by mouth once. (Patient taking differently: Take 20 mEq by mouth daily. ) 90 tablet 3  . pravastatin (PRAVACHOL) 20 MG tablet Take 1 tablet (20 mg total) by mouth daily. 90 tablet 3  . Probiotic Product (PROBIOTIC PO) Take by mouth daily.    . ranitidine (ZANTAC) 300 MG tablet Take 1 tablet (300 mg total) by mouth at bedtime. 90 tablet 3  . SYMBICORT 160-4.5 MCG/ACT inhaler INHALE 2 PUFFS INTO THE LUNGS 2 TIMES DAILY 3 Inhaler 1  . tiotropium (SPIRIVA) 18 MCG inhalation capsule Place 1 capsule (18 mcg total) into inhaler and inhale daily. 90 capsule 3  . traMADol (ULTRAM) 50 MG tablet TAKE ONE TABLET BY MOUTH THREE TIMES DAILY AS NEEDED 90 tablet 0  . sertraline (ZOLOFT) 50 MG tablet TAKE 1 TABLET EVERY DAY 90 tablet 3   No facility-administered medications prior to visit.    Allergies  Allergen Reactions  . Ace Inhibitors   . Indomethacin   . Montelukast Sodium     REACTION: Heart palpitations, chest pain  . Oseltamivir Phosphate     REACTION: nausea, vomiting, diarrhea, dizziness  . Sulfa Antibiotics Other (See Comments)    CP  . Sulfonamide Derivatives     Review of Systems  Constitutional: Negative for fever and malaise/fatigue.  HENT: Negative for congestion.   Eyes:  Negative for discharge.  Respiratory: Positive for shortness of breath.   Cardiovascular: Negative for chest pain, palpitations and leg swelling.  Gastrointestinal: Negative for nausea and abdominal pain.  Genitourinary: Negative for dysuria.  Musculoskeletal: Negative for falls.  Skin: Negative for rash.  Neurological: Negative for loss of consciousness and headaches.  Endo/Heme/Allergies: Negative for environmental allergies.  Psychiatric/Behavioral: Negative for depression. The patient is not nervous/anxious.        Objective:    Physical Exam  Constitutional: She is oriented to person, place, and time. She appears well-developed and well-nourished. No distress.  HENT:  Head: Normocephalic and atraumatic.  Nose: Nose normal.  Eyes: Right eye exhibits no discharge. Left eye exhibits no discharge.  Neck: Normal range of motion. Neck supple.  Cardiovascular: Normal rate and regular rhythm.   Murmur heard. Pulmonary/Chest: Effort normal and breath sounds normal.  Abdominal: Soft. Bowel sounds are normal. There is no tenderness.  Musculoskeletal: She exhibits no edema.  Neurological: She is alert and oriented to person, place, and time.  Skin: Skin is warm and dry.  Psychiatric: She has a normal mood and affect.  Nursing note and vitals reviewed.   BP 130/80 mmHg  Pulse 93  Temp(Src) 98.3 F (36.8 C) (Oral)  Resp 18  Ht 5\' 4"  (1.626 m)  Wt 218 lb 12.8 oz (99.247 kg)  BMI 37.54 kg/m2  SpO2 94% Wt Readings from Last 3 Encounters:  10/02/14 218 lb 12.8 oz (99.247 kg)  06/29/14 225 lb (102.059 kg)  03/30/14 224 lb (101.606 kg)     Lab Results  Component Value Date   WBC 6.6 10/02/2014   HGB 11.5* 10/02/2014   HCT 34.9* 10/02/2014   PLT 147.0* 10/02/2014   GLUCOSE 115* 10/02/2014   CHOL 158 10/02/2014   TRIG 152.0* 10/02/2014   HDL 48.20 10/02/2014   LDLDIRECT 142.7 12/16/2005   LDLCALC 79 10/02/2014   ALT 11 10/02/2014   AST 13 10/02/2014   NA 143 10/02/2014     K 4.4 10/02/2014   CL 103 10/02/2014   CREATININE 1.20 10/02/2014   BUN 22 10/02/2014   CO2 32 10/02/2014   TSH 3.48 10/02/2014   INR 1.0 09/12/2006   HGBA1C 5.9 10/02/2014   MICROALBUR 0.8 02/28/2014    Lab Results  Component Value Date   TSH 3.48 10/02/2014   Lab Results  Component Value Date   WBC 6.6 10/02/2014   HGB 11.5* 10/02/2014   HCT 34.9* 10/02/2014   MCV 92.5 10/02/2014   PLT 147.0* 10/02/2014   Lab Results  Component Value Date   NA 143 10/02/2014   K 4.4 10/02/2014   CO2 32 10/02/2014   GLUCOSE 115* 10/02/2014   BUN 22 10/02/2014   CREATININE 1.20 10/02/2014   BILITOT 0.5 10/02/2014   ALKPHOS 46 10/02/2014   AST 13 10/02/2014   ALT 11 10/02/2014   PROT 7.3 10/02/2014   ALBUMIN 4.1 10/02/2014   CALCIUM 10.6* 10/02/2014   ANIONGAP 10 01/12/2014   GFR 46.74* 10/02/2014   Lab Results  Component Value Date   CHOL 158 10/02/2014   Lab Results  Component Value Date   HDL 48.20 10/02/2014   Lab Results  Component Value Date   LDLCALC 79 10/02/2014   Lab Results  Component Value Date   TRIG 152.0* 10/02/2014   Lab Results  Component Value Date   CHOLHDL 3 10/02/2014   Lab Results  Component Value Date   HGBA1C 5.9 10/02/2014       Assessment & Plan:   Problem List Items Addressed This Visit    Tachycardia   Relevant Orders   CBC (Completed)   TSH (Completed)   Lipid panel (Completed)   Comprehensive metabolic panel (Completed)   Vitamin D (25 hydroxy) (Completed)  IBC panel (Completed)   Uric acid (Completed)   Hemoglobin A1c (Completed)   Hypoxia    Patient is using supplemental O2 with good results, continue the same.       Hyperlipidemia, mixed    Tolerating statin, encouraged heart healthy diet, avoid trans fats, minimize simple carbs and saturated fats. Increase exercise as tolerated      Hypercalcemia    Mild, vitamin D normal. Encouraged to minimize in diet and recheck calcium in labs with PTH with next blood  draw.      Gout    Uric acid WNL today      Relevant Orders   CBC (Completed)   TSH (Completed)   Lipid panel (Completed)   Comprehensive metabolic panel (Completed)   Vitamin D (25 hydroxy) (Completed)   IBC panel (Completed)   Uric acid (Completed)   Hemoglobin A1c (Completed)   GERD    Avoid offending foods, start probiotics. Do not eat large meals in late evening and consider raising head of bed.       Essential hypertension    Well controlled, no changes to meds. Encouraged heart healthy diet such as the DASH diet and exercise as tolerated.       Relevant Orders   CBC (Completed)   TSH (Completed)   Lipid panel (Completed)   Comprehensive metabolic panel (Completed)   Vitamin D (25 hydroxy) (Completed)   IBC panel (Completed)   Uric acid (Completed)   Hemoglobin A1c (Completed)   Disorder of bone and cartilage   Relevant Orders   CBC (Completed)   TSH (Completed)   Lipid panel (Completed)   Comprehensive metabolic panel (Completed)   Vitamin D (25 hydroxy) (Completed)   IBC panel (Completed)   Uric acid (Completed)   Hemoglobin A1c (Completed)   Diabetes type 2, controlled    hgba1c acceptable, minimize simple carbs. Increase exercise as tolerated. Continue current meds      Relevant Orders   CBC (Completed)   TSH (Completed)   Lipid panel (Completed)   Comprehensive metabolic panel (Completed)   Vitamin D (25 hydroxy) (Completed)   IBC panel (Completed)   Uric acid (Completed)   Hemoglobin A1c (Completed)   Constipation   Relevant Orders   CBC (Completed)   TSH (Completed)   Lipid panel (Completed)   Comprehensive metabolic panel (Completed)   Vitamin D (25 hydroxy) (Completed)   IBC panel (Completed)   Uric acid (Completed)   Hemoglobin A1c (Completed)   Anemia    Mild, vitamin B12 was mildly hi on last check will continue to monitor. Increase leafy greens, consider increased lean red meat and using cast iron cookware. Continue to monitor,  report any concerns       Other Visit Diagnoses    Encounter for immunization    -  Primary    Osteopenia        Relevant Orders    Vitamin D (25 hydroxy) (Completed)       I have changed Ms. Mabee's sertraline. I am also having her maintain her glucose blood, albuterol, acetaminophen, Niacin (VITAMIN B-3 PO), Cyanocobalamin (VITAMIN B-12 PO), conjugated estrogens, albuterol, tiotropium, pravastatin, potassium chloride SA, ranitidine, OXYGEN, fluticasone, furosemide, allopurinol, b complex vitamins, Probiotic Product (PROBIOTIC PO), pantoprazole, mometasone-formoterol, metoprolol succinate, losartan, SYMBICORT, and traMADol.  Meds ordered this encounter  Medications  . sertraline (ZOLOFT) 50 MG tablet    Sig: Take 1 tablet (50 mg total) by mouth daily.    Dispense:  90 tablet  Refill:  3     Penni Homans, MD

## 2014-10-30 DIAGNOSIS — H0015 Chalazion left lower eyelid: Secondary | ICD-10-CM | POA: Diagnosis not present

## 2014-10-30 DIAGNOSIS — H40012 Open angle with borderline findings, low risk, left eye: Secondary | ICD-10-CM | POA: Diagnosis not present

## 2014-10-30 DIAGNOSIS — Z9849 Cataract extraction status, unspecified eye: Secondary | ICD-10-CM | POA: Diagnosis not present

## 2014-10-30 DIAGNOSIS — H11153 Pinguecula, bilateral: Secondary | ICD-10-CM | POA: Diagnosis not present

## 2014-10-30 DIAGNOSIS — H18413 Arcus senilis, bilateral: Secondary | ICD-10-CM | POA: Diagnosis not present

## 2014-10-30 DIAGNOSIS — H04123 Dry eye syndrome of bilateral lacrimal glands: Secondary | ICD-10-CM | POA: Diagnosis not present

## 2014-10-30 DIAGNOSIS — S0502XA Injury of conjunctiva and corneal abrasion without foreign body, left eye, initial encounter: Secondary | ICD-10-CM | POA: Diagnosis not present

## 2014-10-30 DIAGNOSIS — Z961 Presence of intraocular lens: Secondary | ICD-10-CM | POA: Diagnosis not present

## 2014-11-01 ENCOUNTER — Other Ambulatory Visit: Payer: Self-pay | Admitting: Family Medicine

## 2014-11-06 ENCOUNTER — Other Ambulatory Visit: Payer: Self-pay | Admitting: Family Medicine

## 2014-11-06 DIAGNOSIS — R0902 Hypoxemia: Secondary | ICD-10-CM | POA: Diagnosis not present

## 2014-11-06 DIAGNOSIS — J4520 Mild intermittent asthma, uncomplicated: Secondary | ICD-10-CM | POA: Diagnosis not present

## 2014-11-19 ENCOUNTER — Other Ambulatory Visit: Payer: Self-pay | Admitting: Family Medicine

## 2014-11-20 NOTE — Telephone Encounter (Signed)
Faxed hardcopy for Tramadol to [facility]

## 2014-11-21 ENCOUNTER — Emergency Department (HOSPITAL_COMMUNITY)
Admission: EM | Admit: 2014-11-21 | Discharge: 2014-11-21 | Disposition: A | Discharge status: Home / Self Care | Payer: Commercial Managed Care - HMO | Attending: Emergency Medicine | Admitting: Emergency Medicine

## 2014-11-21 ENCOUNTER — Emergency Department (HOSPITAL_COMMUNITY): Payer: Commercial Managed Care - HMO

## 2014-11-21 ENCOUNTER — Encounter (HOSPITAL_COMMUNITY): Payer: Self-pay | Admitting: Emergency Medicine

## 2014-11-21 ENCOUNTER — Other Ambulatory Visit: Payer: Self-pay | Admitting: Family Medicine

## 2014-11-21 DIAGNOSIS — Z79899 Other long term (current) drug therapy: Secondary | ICD-10-CM | POA: Diagnosis not present

## 2014-11-21 DIAGNOSIS — Z86718 Personal history of other venous thrombosis and embolism: Secondary | ICD-10-CM | POA: Diagnosis not present

## 2014-11-21 DIAGNOSIS — Y9389 Activity, other specified: Secondary | ICD-10-CM | POA: Diagnosis not present

## 2014-11-21 DIAGNOSIS — S0083XA Contusion of other part of head, initial encounter: Secondary | ICD-10-CM | POA: Insufficient documentation

## 2014-11-21 DIAGNOSIS — Y92009 Unspecified place in unspecified non-institutional (private) residence as the place of occurrence of the external cause: Secondary | ICD-10-CM | POA: Insufficient documentation

## 2014-11-21 DIAGNOSIS — K219 Gastro-esophageal reflux disease without esophagitis: Secondary | ICD-10-CM | POA: Diagnosis not present

## 2014-11-21 DIAGNOSIS — J449 Chronic obstructive pulmonary disease, unspecified: Secondary | ICD-10-CM | POA: Diagnosis not present

## 2014-11-21 DIAGNOSIS — I10 Essential (primary) hypertension: Secondary | ICD-10-CM | POA: Diagnosis not present

## 2014-11-21 DIAGNOSIS — S0990XA Unspecified injury of head, initial encounter: Secondary | ICD-10-CM | POA: Diagnosis not present

## 2014-11-21 DIAGNOSIS — E785 Hyperlipidemia, unspecified: Secondary | ICD-10-CM | POA: Diagnosis not present

## 2014-11-21 DIAGNOSIS — Z8701 Personal history of pneumonia (recurrent): Secondary | ICD-10-CM | POA: Insufficient documentation

## 2014-11-21 DIAGNOSIS — W1839XA Other fall on same level, initial encounter: Secondary | ICD-10-CM | POA: Diagnosis not present

## 2014-11-21 DIAGNOSIS — Z8619 Personal history of other infectious and parasitic diseases: Secondary | ICD-10-CM | POA: Diagnosis not present

## 2014-11-21 DIAGNOSIS — D649 Anemia, unspecified: Secondary | ICD-10-CM | POA: Insufficient documentation

## 2014-11-21 DIAGNOSIS — M109 Gout, unspecified: Secondary | ICD-10-CM | POA: Diagnosis not present

## 2014-11-21 DIAGNOSIS — F329 Major depressive disorder, single episode, unspecified: Secondary | ICD-10-CM | POA: Diagnosis not present

## 2014-11-21 DIAGNOSIS — Y998 Other external cause status: Secondary | ICD-10-CM | POA: Insufficient documentation

## 2014-11-21 DIAGNOSIS — S098XXA Other specified injuries of head, initial encounter: Secondary | ICD-10-CM | POA: Diagnosis not present

## 2014-11-21 DIAGNOSIS — Z87440 Personal history of urinary (tract) infections: Secondary | ICD-10-CM | POA: Diagnosis not present

## 2014-11-21 DIAGNOSIS — S064X0A Epidural hemorrhage without loss of consciousness, initial encounter: Secondary | ICD-10-CM | POA: Diagnosis not present

## 2014-11-21 MED ORDER — ACETAMINOPHEN 500 MG PO TABS
1000.0000 mg | ORAL_TABLET | Freq: Once | ORAL | Status: AC
  Administered 2014-11-21: 1000 mg via ORAL
  Filled 2014-11-21: qty 2

## 2014-11-21 NOTE — ED Provider Notes (Signed)
CSN: [id number]     Arrival date & time 11/21/14  [id number] History   First MD Initiated Contact with Patient 11/21/14 0458     Chief Complaint  Patient presents with  . Fall     (Consider location/radiation/quality/duration/timing/severity/associated sxs/prior Treatment) Patient is a 74 y.o. female presenting with fall and head injury.  Fall This is a new problem. Episode onset: 330AM. The problem occurs constantly. The problem has not changed since onset.Associated symptoms include headaches. Pertinent negatives include no chest pain, no abdominal pain and no shortness of breath. Nothing aggravates the symptoms. Nothing relieves the symptoms. She has tried nothing for the symptoms.  Head Injury Location:  Frontal Mechanism of injury: fall   Pain details:    Quality:  Aching   Radiates to:  Face   Severity:  Mild   Timing:  Constant   Progression:  Unchanged Chronicity:  New Relieved by:  Nothing Worsened by:  Nothing tried Ineffective treatments:  None tried Associated symptoms: headache   Associated symptoms: no blurred vision, no disorientation, no focal weakness, no loss of consciousness, no numbness and no vomiting   Risk factors: being elderly     Past Medical History  Diagnosis Date  . Asthma     PFT 02/06/09 FEV1 1.41 (65%), FVC 1.92 (64), FEV1% 74, TLC 3.47 (71%), DLCO 48%, +BD  . ACE-inhibitor cough   . DVT (deep venous thrombosis) ([place]) 1987  . GERD (gastroesophageal reflux disease)   . Arthritis   . Atypical chest pain     s/p cath, Normal coronaries, Non ST elevation myocardial infarction, Rt groin pseudoaneurysm  . Hyperlipidemia   . Hypertension   . Depression   . Gout   . Abnormal glucose tolerance test   . Sun-damaged skin 10/24/2012  . Urinary frequency 10/24/2012  . Unspecified constipation 06/05/2013  . UTI (lower urinary tract infection) 10/24/2012  . Pneumonia   . COPD (chronic obstructive pulmonary disease) ([place])   . Anemia 03/12/2014  . Bacterial  vaginosis 03/12/2014  . Hypercalcemia 10/15/2014  . Hypoxia 10/15/2014   Past Surgical History  Procedure Laterality Date  . Appendectomy  1951  . Tubal ligation  1968  . Tonsillectomy  1950   Family History  Problem Relation Age of Onset  . Asthma Sister   . Arthritis Sister   . Hypertension Sister   . Hyperlipidemia Sister   . Cancer Sister 86    uterine  . Uterine cancer Sister   . Coronary artery disease Brother     x2  . Cancer Brother     lung, smoker  . Arthritis Brother   . Coronary artery disease Other   . Hypertension Sister   . Arthritis Sister   . Hyperlipidemia Sister   . Emphysema Sister   . COPD Sister     smoker  . Heart disease Sister   . Hyperlipidemia Sister   . Hypertension Sister   . Arthritis Sister   . Emphysema Brother   . Heart disease Brother     MI smoker  . Colon polyps Sister   . Mental illness Father   . Alcohol abuse Father   . Heart disease Mother     MI  . Hyperlipidemia Mother   . Epilepsy Daughter   . Hypertension Daughter   . Obesity Daughter   . COPD Brother     smoker  . Cancer Brother     lung   Social History  Substance Use Topics  . Smoking status:  Never Smoker   . Smokeless tobacco: Never Used  . Alcohol Use: No   OB History    No data available     Review of Systems  Eyes: Negative for blurred vision.  Respiratory: Negative for shortness of breath.   Cardiovascular: Negative for chest pain.  Gastrointestinal: Negative for vomiting and abdominal pain.  Neurological: Positive for headaches. Negative for focal weakness, loss of consciousness and numbness.  All other systems reviewed and are negative.     Allergies  Ace inhibitors; Indomethacin; Montelukast sodium; Oseltamivir phosphate; Sulfa antibiotics; and Sulfonamide derivatives  Home Medications   Prior to Admission medications   Medication Sig Start Date End Date Taking? Authorizing Provider  acetaminophen (TYLENOL) 500 MG tablet Take 500 mg by  mouth every 6 (six) hours as needed.    Historical Provider, MD  albuterol (ACCUNEB) 0.63 MG/3ML nebulizer solution USE ONE VIAL IN NEBULIZER EVERY 6 HOURS AS NEEDED 11/28/13   Mosie Lukes, MD  albuterol (PROAIR HFA) 108 (90 BASE) MCG/ACT inhaler Inhale 2 puffs into the lungs every 4 (four) hours as needed. 02/21/13   Mosie Lukes, MD  allopurinol (ZYLOPRIM) 100 MG tablet TAKE TWO TABLETS BY MOUTH ONCE DAILY 11/01/14   Mosie Lukes, MD  b complex vitamins tablet Take 1 tablet by mouth daily.    Historical Provider, MD  conjugated estrogens (PREMARIN) vaginal cream Place 1 Applicatorful vaginally daily. Patient taking differently: Place 1 Applicatorful vaginally 3 (three) times a week.  11/28/13   Mosie Lukes, MD  Cyanocobalamin (VITAMIN B-12 PO) Take by mouth daily.    Historical Provider, MD  fluticasone (FLONASE) 50 MCG/ACT nasal spray Place 2 sprays into both nostrils daily. Patient taking differently: Place 2 sprays into both nostrils daily as needed.  01/24/14   Brunetta Jeans, PA-C  furosemide (LASIX) 40 MG tablet TAKE 1/2 TABLET EVERY OTHER DAY 03/17/14   Mosie Lukes, MD  glucose blood (ONE TOUCH TEST STRIPS) test strip Use as instructed to check blood sugar once weekly. DX 790.29     Historical Provider, MD  losartan (COZAAR) 25 MG tablet TAKE ONE TABLET BY MOUTH ONCE DAILY 11/06/14   Mosie Lukes, MD  metoprolol succinate (TOPROL-XL) 25 MG 24 hr tablet TAKE ONE TABLET BY MOUTH ONCE DAILY 11/06/14   Mosie Lukes, MD  mometasone-formoterol (DULERA) 200-5 MCG/ACT AERO Inhale 2 puffs into the lungs 2 (two) times daily. 06/29/14   Mosie Lukes, MD  Niacin (VITAMIN B-3 PO) Take 1 tablet by mouth daily.    Historical Provider, MD  OXYGEN Inhale into the lungs.    Historical Provider, MD  pantoprazole (PROTONIX) 40 MG tablet Take 1 tablet (40 mg total) by mouth 2 (two) times daily. 05/05/14   Mosie Lukes, MD  potassium chloride SA (K-DUR,KLOR-CON) 20 MEQ tablet Take 1 tablet (20 mEq  total) by mouth once. Patient taking differently: Take 20 mEq by mouth daily.  11/28/13   Mosie Lukes, MD  pravastatin (PRAVACHOL) 20 MG tablet Take 1 tablet (20 mg total) by mouth daily. 11/28/13   Mosie Lukes, MD  Probiotic Product (PROBIOTIC PO) Take by mouth daily.    Historical Provider, MD  ranitidine (ZANTAC) 300 MG tablet Take 1 tablet (300 mg total) by mouth at bedtime. 11/28/13   Mosie Lukes, MD  sertraline (ZOLOFT) 50 MG tablet Take 1 tablet (50 mg total) by mouth daily. 10/02/14   Mosie Lukes, MD  SYMBICORT 160-4.5 MCG/ACT inhaler  INHALE 2 PUFFS INTO THE LUNGS 2 TIMES DAILY 07/06/14   Mosie Lukes, MD  tiotropium (SPIRIVA) 18 MCG inhalation capsule Place 1 capsule (18 mcg total) into inhaler and inhale daily. 11/28/13   Mosie Lukes, MD  traMADol (ULTRAM) 50 MG tablet TAKE ONE TABLET BY MOUTH THREE TIMES DAILY AS NEEDED 11/19/14   Mosie Lukes, MD   BP 127/60 mmHg  Pulse 90  Temp(Src) 98.4 F (36.9 C) (Oral)  Resp 22  SpO2 98% Physical Exam  Constitutional: She is oriented to person, place, and time. She appears well-developed and well-nourished. No distress.  HENT:  Head: Normocephalic. Head is with contusion (with overlying hematoma and spreading of ecchymosis to dpendent portions of face).    Eyes: Conjunctivae are normal.  Neck: Neck supple. No tracheal deviation present.  Cardiovascular: Normal rate and regular rhythm.   Pulmonary/Chest: Effort normal. No respiratory distress.  Abdominal: Soft. She exhibits no distension.  Neurological: She is alert and oriented to person, place, and time.  Skin: Skin is warm and dry.  Psychiatric: She has a normal mood and affect.    ED Course  Procedures (including critical care time) Labs Review Labs Reviewed - No data to display  Imaging Review Ct Head Wo Contrast  11/21/2014  CLINICAL DATA:  Initial evaluation for acute trauma, fall. EXAM: CT HEAD WITHOUT CONTRAST TECHNIQUE: Contiguous axial images were  obtained from the base of the skull through the vertex without intravenous contrast. COMPARISON:  Prior study from 09/09/2006. FINDINGS: Left frontal/periorbital scalp contusion present. Scalp soft tissues otherwise within normal limits. Orbits themselves demonstrate no acute abnormality. Calvarium intact. Paranasal sinuses and mastoid air cells are clear. Mild age-related cerebral atrophy present. No significant white matter disease for patient age. No acute intracranial hemorrhage. No acute large vessel territory infarct. No mass lesion, midline shift, or mass effect. No hydrocephalus. No extra-axial fluid collection. Thin hyperdensity along the falx favored to reflect a normal dural reflections rather than subdural blood given the slight rotation of the patient. Slight cortical medialization noted within the posterior limb left frontal lobe. IMPRESSION: 1. No acute intracranial process. 2. Left frontal/periorbital scalp contusion. Electronically Signed   By: Jeannine Boga M.D.   On: 11/21/2014 05:57   I have personally reviewed and evaluated these images and lab results as part of my medical decision-making.   EKG Interpretation None      MDM   Final diagnoses:  Traumatic hematoma of forehead, initial encounter    74 y.o. female presents with mechanical fall sustaining forehead hematoma. No anticoagulation. CT without ICH or other acute findings. Supportive care recommended. Plan to follow up with PCP as needed and return precautions discussed for worsening or new concerning symptoms.     Leo Grosser, MD 11/21/14 0900

## 2014-11-21 NOTE — ED Notes (Signed)
[facility] here for transport. Pt declines transport stating daughter will be here will oxygen at 0830.

## 2014-11-21 NOTE — ED Notes (Signed)
Patient is waiting on family to bring oxygen tank and for them to take her home. Patient is discharged.

## 2014-11-21 NOTE — Discharge Instructions (Signed)

## 2014-11-21 NOTE — Telephone Encounter (Signed)
Faxed hardcopy for Tramadol to [place]

## 2014-11-21 NOTE — ED Notes (Signed)
Bed: [id number] Expected date:  Expected time:  Means of arrival:  Comments: EMS 74 yo female/fall, no LOC-hematoma left eye area-from home

## 2014-11-21 NOTE — ED Notes (Signed)
Patient lives by herself. Patient was at home. Patient Golden Circle her head on the floor. Patient is not on blood thinners.

## 2014-11-21 NOTE — ED Notes (Signed)
Daughter here with portable oxygen

## 2014-11-28 ENCOUNTER — Encounter: Payer: Self-pay | Admitting: Family Medicine

## 2014-11-28 ENCOUNTER — Ambulatory Visit (INDEPENDENT_AMBULATORY_CARE_PROVIDER_SITE_OTHER): Payer: Commercial Managed Care - HMO | Admitting: Family Medicine

## 2014-11-28 VITALS — BP 122/68 | HR 86 | Temp 98.2°F | Ht 65.0 in | Wt 219.1 lb

## 2014-11-28 DIAGNOSIS — K219 Gastro-esophageal reflux disease without esophagitis: Secondary | ICD-10-CM | POA: Diagnosis not present

## 2014-11-28 DIAGNOSIS — W19XXXD Unspecified fall, subsequent encounter: Secondary | ICD-10-CM

## 2014-11-28 DIAGNOSIS — I10 Essential (primary) hypertension: Secondary | ICD-10-CM

## 2014-11-28 DIAGNOSIS — Y92009 Unspecified place in unspecified non-institutional (private) residence as the place of occurrence of the external cause: Secondary | ICD-10-CM

## 2014-11-28 DIAGNOSIS — E118 Type 2 diabetes mellitus with unspecified complications: Secondary | ICD-10-CM | POA: Diagnosis not present

## 2014-11-28 DIAGNOSIS — R296 Repeated falls: Secondary | ICD-10-CM

## 2014-11-28 DIAGNOSIS — R000 Tachycardia, unspecified: Secondary | ICD-10-CM

## 2014-11-28 NOTE — Progress Notes (Signed)
Pre visit review using our clinic review tool, if applicable. No additional management support is needed unless otherwise documented below in the visit note. 

## 2014-11-28 NOTE — Patient Instructions (Signed)

## 2014-12-04 ENCOUNTER — Encounter: Payer: Self-pay | Admitting: Family Medicine

## 2014-12-04 DIAGNOSIS — W19XXXA Unspecified fall, initial encounter: Secondary | ICD-10-CM | POA: Insufficient documentation

## 2014-12-04 DIAGNOSIS — Y92009 Unspecified place in unspecified non-institutional (private) residence as the place of occurrence of the external cause: Secondary | ICD-10-CM

## 2014-12-04 NOTE — Progress Notes (Signed)
Subjective:    Patient ID: [name], female    DOB: Oct 24, 1940, 74 y.o.   MRN: [id number]  Chief Complaint  Patient presents with  . Follow-up    HPI Patient is in today for ER follow-up. She fell in her home and injured her face but is doing well at the present time. CT scan imaging in the ED were unremarkable and she denies any headache or persistent difficulties. She denies any bony tenderness or signs of acute illness. She reports she tripped. Syncope or other neurologic complaints. Denies CP/palp/SOB/HA/congestion/fevers/GI or GU c/o. Taking meds as prescribed  Past Medical History  Diagnosis Date  . Asthma     PFT 02/06/09 FEV1 1.41 (65%), FVC 1.92 (64), FEV1% 74, TLC 3.47 (71%), DLCO 48%, +BD  . ACE-inhibitor cough   . DVT (deep venous thrombosis) ([place]) 1987  . GERD (gastroesophageal reflux disease)   . Arthritis   . Atypical chest pain     s/p cath, Normal coronaries, Non ST elevation myocardial infarction, Rt groin pseudoaneurysm  . Hyperlipidemia   . Hypertension   . Depression   . Gout   . Abnormal glucose tolerance test   . Sun-damaged skin 10/24/2012  . Urinary frequency 10/24/2012  . Unspecified constipation 06/05/2013  . UTI (lower urinary tract infection) 10/24/2012  . Pneumonia   . COPD (chronic obstructive pulmonary disease) ([place])   . Anemia 03/12/2014  . Bacterial vaginosis 03/12/2014  . Hypercalcemia 10/15/2014  . Hypoxia 10/15/2014  . Fall at home 12/04/2014    Past Surgical History  Procedure Laterality Date  . Appendectomy  1951  . Tubal ligation  1968  . Tonsillectomy  1950    Family History  Problem Relation Age of Onset  . Asthma Sister   . Arthritis Sister   . Hypertension Sister   . Hyperlipidemia Sister   . Cancer Sister 42    uterine  . Uterine cancer Sister   . Coronary artery disease Brother     x2  . Cancer Brother     lung, smoker  . Arthritis Brother   . Coronary artery disease Other   . Hypertension Sister   . Arthritis  Sister   . Hyperlipidemia Sister   . Emphysema Sister   . COPD Sister     smoker  . Heart disease Sister   . Hyperlipidemia Sister   . Hypertension Sister   . Arthritis Sister   . Emphysema Brother   . Heart disease Brother     MI smoker  . Colon polyps Sister   . Mental illness Father   . Alcohol abuse Father   . Heart disease Mother     MI  . Hyperlipidemia Mother   . Epilepsy Daughter   . Hypertension Daughter   . Obesity Daughter   . COPD Brother     smoker  . Cancer Brother     lung    Social History   Social History  . Marital Status: Widowed    Spouse Name: N/A  . Number of Children: 1  . Years of Education: N/A   Occupational History  . Retired    Social History Main Topics  . Smoking status: Never Smoker   . Smokeless tobacco: Never Used  . Alcohol Use: No  . Drug Use: No  . Sexual Activity: No     Comment: lives alone, no dietary restrictions   Other Topics Concern  . Not on file   Social History Narrative  Outpatient Prescriptions Prior to Visit  Medication Sig Dispense Refill  . acetaminophen (TYLENOL) 500 MG tablet Take 500 mg by mouth every 6 (six) hours as needed.    Marland Kitchen albuterol (ACCUNEB) 0.63 MG/3ML nebulizer solution USE ONE VIAL IN NEBULIZER EVERY 6 HOURS AS NEEDED 150 mL 1  . albuterol (PROAIR HFA) 108 (90 BASE) MCG/ACT inhaler Inhale 2 puffs into the lungs every 4 (four) hours as needed. 3 Inhaler 3  . allopurinol (ZYLOPRIM) 100 MG tablet TAKE TWO TABLETS BY MOUTH ONCE DAILY 60 tablet 4  . b complex vitamins tablet Take 1 tablet by mouth daily.    Marland Kitchen conjugated estrogens (PREMARIN) vaginal cream Place 1 Applicatorful vaginally daily. (Patient taking differently: Place 1 Applicatorful vaginally 3 (three) times a week. ) 42.5 g 12  . Cyanocobalamin (VITAMIN B-12 PO) Take by mouth daily.    . fluticasone (FLONASE) 50 MCG/ACT nasal spray Place 2 sprays into both nostrils daily. (Patient taking differently: Place 2 sprays into both nostrils  daily as needed. ) 16 g 6  . furosemide (LASIX) 40 MG tablet TAKE 1/2 TABLET EVERY OTHER DAY 23 tablet 6  . glucose blood (ONE TOUCH TEST STRIPS) test strip Use as instructed to check blood sugar once weekly. DX 790.29     . losartan (COZAAR) 25 MG tablet TAKE ONE TABLET BY MOUTH ONCE DAILY 30 tablet 6  . metoprolol succinate (TOPROL-XL) 25 MG 24 hr tablet TAKE ONE TABLET BY MOUTH ONCE DAILY 30 tablet 6  . mometasone-formoterol (DULERA) 200-5 MCG/ACT AERO Inhale 2 puffs into the lungs 2 (two) times daily. 1 Inhaler 11  . Niacin (VITAMIN B-3 PO) Take 1 tablet by mouth daily.    . OXYGEN Inhale into the lungs.    . pantoprazole (PROTONIX) 40 MG tablet Take 1 tablet (40 mg total) by mouth 2 (two) times daily. 60 tablet 6  . potassium chloride SA (K-DUR,KLOR-CON) 20 MEQ tablet Take 1 tablet (20 mEq total) by mouth once. (Patient taking differently: Take 20 mEq by mouth daily. ) 90 tablet 3  . pravastatin (PRAVACHOL) 20 MG tablet Take 1 tablet (20 mg total) by mouth daily. 90 tablet 3  . Probiotic Product (PROBIOTIC PO) Take by mouth daily.    . ranitidine (ZANTAC) 300 MG tablet Take 1 tablet (300 mg total) by mouth at bedtime. 90 tablet 3  . sertraline (ZOLOFT) 50 MG tablet Take 1 tablet (50 mg total) by mouth daily. 90 tablet 3  . SYMBICORT 160-4.5 MCG/ACT inhaler INHALE 2 PUFFS INTO THE LUNGS 2 TIMES DAILY 3 Inhaler 1  . tiotropium (SPIRIVA) 18 MCG inhalation capsule Place 1 capsule (18 mcg total) into inhaler and inhale daily. 90 capsule 3  . traMADol (ULTRAM) 50 MG tablet TAKE ONE TABLET BY MOUTH THREE TIMES DAILY AS NEEDED 90 tablet 0   No facility-administered medications prior to visit.    Allergies  Allergen Reactions  . Ace Inhibitors   . Indomethacin   . Montelukast Sodium     REACTION: Heart palpitations, chest pain  . Oseltamivir Phosphate     REACTION: nausea, vomiting, diarrhea, dizziness  . Sulfa Antibiotics Other (See Comments)    CP  . Sulfonamide Derivatives     Review  of Systems  Constitutional: Negative for fever and malaise/fatigue.  HENT: Negative for congestion.   Eyes: Negative for discharge.  Respiratory: Negative for shortness of breath.   Cardiovascular: Negative for chest pain, palpitations and leg swelling.  Gastrointestinal: Negative for nausea and abdominal pain.  Genitourinary: Negative for dysuria.  Musculoskeletal: Positive for falls. Negative for joint pain.  Skin: Negative for rash.  Neurological: Negative for focal weakness, seizures, loss of consciousness and headaches.  Endo/Heme/Allergies: Negative for environmental allergies.  Psychiatric/Behavioral: Negative for depression. The patient is not nervous/anxious.        Objective:    Physical Exam  Constitutional: She is oriented to person, place, and time. She appears well-developed and well-nourished. No distress.  HENT:  Head: Normocephalic and atraumatic.  Nose: Nose normal.  Significant bruising over left face, no point tenderness with palpation.   Eyes: Right eye exhibits no discharge. Left eye exhibits no discharge.  Neck: Normal range of motion. Neck supple.  Cardiovascular: Normal rate and regular rhythm.   No murmur heard. Pulmonary/Chest: Effort normal and breath sounds normal.  Abdominal: Soft. Bowel sounds are normal. There is no tenderness.  Musculoskeletal: She exhibits no edema.  Neurological: She is alert and oriented to person, place, and time.  Skin: Skin is warm and dry.  Psychiatric: She has a normal mood and affect.  Nursing note and vitals reviewed.   BP 122/68 mmHg  Pulse 86  Temp(Src) 98.2 F (36.8 C) (Oral)  Ht 5\' 5"  (1.651 m)  Wt 219 lb 2 oz (99.394 kg)  BMI 36.46 kg/m2  SpO2 94% Wt Readings from Last 3 Encounters:  11/28/14 219 lb 2 oz (99.394 kg)  11/21/14 220 lb (99.791 kg)  10/02/14 218 lb 12.8 oz (99.247 kg)     Lab Results  Component Value Date   WBC 6.6 10/02/2014   HGB 11.5* 10/02/2014   HCT 34.9* 10/02/2014   PLT 147.0*  10/02/2014   GLUCOSE 115* 10/02/2014   CHOL 158 10/02/2014   TRIG 152.0* 10/02/2014   HDL 48.20 10/02/2014   LDLDIRECT 142.7 12/16/2005   LDLCALC 79 10/02/2014   ALT 11 10/02/2014   AST 13 10/02/2014   NA 143 10/02/2014   K 4.4 10/02/2014   CL 103 10/02/2014   CREATININE 1.20 10/02/2014   BUN 22 10/02/2014   CO2 32 10/02/2014   TSH 3.48 10/02/2014   INR 1.0 09/12/2006   HGBA1C 5.9 10/02/2014   MICROALBUR 0.8 02/28/2014    Lab Results  Component Value Date   TSH 3.48 10/02/2014   Lab Results  Component Value Date   WBC 6.6 10/02/2014   HGB 11.5* 10/02/2014   HCT 34.9* 10/02/2014   MCV 92.5 10/02/2014   PLT 147.0* 10/02/2014   Lab Results  Component Value Date   NA 143 10/02/2014   K 4.4 10/02/2014   CO2 32 10/02/2014   GLUCOSE 115* 10/02/2014   BUN 22 10/02/2014   CREATININE 1.20 10/02/2014   BILITOT 0.5 10/02/2014   ALKPHOS 46 10/02/2014   AST 13 10/02/2014   ALT 11 10/02/2014   PROT 7.3 10/02/2014   ALBUMIN 4.1 10/02/2014   CALCIUM 10.6* 10/02/2014   ANIONGAP 10 01/12/2014   GFR 46.74* 10/02/2014   Lab Results  Component Value Date   CHOL 158 10/02/2014   Lab Results  Component Value Date   HDL 48.20 10/02/2014   Lab Results  Component Value Date   LDLCALC 79 10/02/2014   Lab Results  Component Value Date   TRIG 152.0* 10/02/2014   Lab Results  Component Value Date   CHOLHDL 3 10/02/2014   Lab Results  Component Value Date   HGBA1C 5.9 10/02/2014       Assessment & Plan:   Problem List Items Addressed This Visit  Diabetes type 2, controlled ([place])    hgba1c acceptable, minimize simple carbs. Increase exercise as tolerated. Continue current meds      Essential hypertension    Well controlled, no changes to meds. Encouraged heart healthy diet such as the DASH diet and exercise as tolerated.       Fall at home    Tripped and hit her head was seen in Ed and imaging and exam were negative for anything concerning. She is feeling  well despite her bruising. She is encouraged to report any change in symptoms or recurrent falls      GERD    Avoid offending foods, start probiotics. Do not eat large meals in late evening and consider raising head of bed.       Tachycardia    RRR upon recheck       Other Visit Diagnoses    Recurrent falls while walking    -  Primary    Relevant Orders    Ambulatory referral to Physical Therapy       I am having Ms. Kilian maintain her glucose blood, albuterol, acetaminophen, Niacin (VITAMIN B-3 PO), Cyanocobalamin (VITAMIN B-12 PO), conjugated estrogens, albuterol, tiotropium, pravastatin, potassium chloride SA, ranitidine, OXYGEN, fluticasone, furosemide, b complex vitamins, Probiotic Product (PROBIOTIC PO), pantoprazole, mometasone-formoterol, SYMBICORT, sertraline, allopurinol, losartan, metoprolol succinate, and traMADol.  No orders of the defined types were placed in this encounter.     Penni Homans, MD

## 2014-12-04 NOTE — Assessment & Plan Note (Signed)
Avoid offending foods, start probiotics. Do not eat large meals in late evening and consider raising head of bed.  

## 2014-12-04 NOTE — Assessment & Plan Note (Signed)
Tripped and hit her head was seen in Ed and imaging and exam were negative for anything concerning. She is feeling well despite her bruising. She is encouraged to report any change in symptoms or recurrent falls

## 2014-12-04 NOTE — Assessment & Plan Note (Signed)
hgba1c acceptable, minimize simple carbs. Increase exercise as tolerated. Continue current meds 

## 2014-12-04 NOTE — Assessment & Plan Note (Signed)
RRR upon recheck

## 2014-12-04 NOTE — Assessment & Plan Note (Signed)
Well controlled, no changes to meds. Encouraged heart healthy diet such as the DASH diet and exercise as tolerated.  °

## 2014-12-06 ENCOUNTER — Telehealth: Payer: Self-pay | Admitting: Family Medicine

## 2014-12-06 NOTE — Telephone Encounter (Signed)
Caller nameJeannene Patella @ [facility] Phone# [phone] Fax# [phone]  Reason for call: please fax recent OV notes to get pt recerted for O2

## 2014-12-07 ENCOUNTER — Telehealth: Payer: Self-pay | Admitting: Family Medicine

## 2014-12-07 DIAGNOSIS — J4520 Mild intermittent asthma, uncomplicated: Secondary | ICD-10-CM | POA: Diagnosis not present

## 2014-12-07 DIAGNOSIS — R0902 Hypoxemia: Secondary | ICD-10-CM | POA: Diagnosis not present

## 2014-12-07 DIAGNOSIS — J45909 Unspecified asthma, uncomplicated: Secondary | ICD-10-CM

## 2014-12-07 MED ORDER — TIOTROPIUM BROMIDE MONOHYDRATE 18 MCG IN CAPS: 18.0000 ug | ORAL_CAPSULE | Freq: Every day | RESPIRATORY_TRACT | Status: DC

## 2014-12-07 NOTE — Telephone Encounter (Signed)
Caller name: Self   Can be reached:[phone]  Pharmacy:  [facility], [place] [phone] (Phone) [phone] (Fax)         Reason for call: Needs refill on tiotropium (SPIRIVA) 18 MCG inhalation capsule [id number] (1 month supply due to finances)

## 2014-12-07 NOTE — Telephone Encounter (Signed)
Sent in #30 day supply of spiriva as the patient wanted and sent to [facility].  Patient is aware .

## 2014-12-07 NOTE — Telephone Encounter (Signed)
Notes faxed as requested.

## 2014-12-11 ENCOUNTER — Ambulatory Visit (INDEPENDENT_AMBULATORY_CARE_PROVIDER_SITE_OTHER): Payer: Commercial Managed Care - HMO | Admitting: Rehabilitative and Restorative Service Providers"

## 2014-12-11 ENCOUNTER — Encounter: Payer: Self-pay | Admitting: Rehabilitative and Restorative Service Providers"

## 2014-12-11 DIAGNOSIS — R29898 Other symptoms and signs involving the musculoskeletal system: Secondary | ICD-10-CM | POA: Diagnosis not present

## 2014-12-11 DIAGNOSIS — R2689 Other abnormalities of gait and mobility: Secondary | ICD-10-CM | POA: Diagnosis not present

## 2014-12-11 DIAGNOSIS — R293 Abnormal posture: Secondary | ICD-10-CM

## 2014-12-11 DIAGNOSIS — R2681 Unsteadiness on feet: Secondary | ICD-10-CM

## 2014-12-11 NOTE — Patient Instructions (Signed)
Sitting cow pose (see picture from Freda Munro)  Standing at counter lifting chest contracting erector spinae(see picture from [place])    Bridging    Slowly raise buttocks from floor, keeping stomach tight. Hold 10 sec  Repeat __10__ times per set. Do __2__ sessions per day.

## 2014-12-11 NOTE — Therapy (Signed)
[facility] [place], [place], [address] Phone: [phone]   Fax:  [phone]  Physical Therapy Evaluation  Patient Details  Name: [name] MRN: [id number] Date of Birth: [birth year] Referring Provider: Dr. Willette Alma  Encounter Date: 12/11/2014      PT End of Session - 12/11/14 1159    Visit Number 1   Number of Visits 12   Date for PT Re-Evaluation 01/24/15   PT Start Time [id number]   PT Stop Time 1248   PT Time Calculation (min) 49 min   Activity Tolerance Patient tolerated treatment well      Past Medical History  Diagnosis Date  . Asthma     PFT 02/06/09 FEV1 1.41 (65%), FVC 1.92 (64), FEV1% 74, TLC 3.47 (71%), DLCO 48%, +BD  . ACE-inhibitor cough   . DVT (deep venous thrombosis) ([place]) 1987  . GERD (gastroesophageal reflux disease)   . Arthritis   . Atypical chest pain     s/p cath, Normal coronaries, Non ST elevation myocardial infarction, Rt groin pseudoaneurysm  . Hyperlipidemia   . Hypertension   . Depression   . Gout   . Abnormal glucose tolerance test   . Sun-damaged skin 10/24/2012  . Urinary frequency 10/24/2012  . Unspecified constipation 06/05/2013  . UTI (lower urinary tract infection) 10/24/2012  . Pneumonia   . COPD (chronic obstructive pulmonary disease) ([place])   . Anemia 03/12/2014  . Bacterial vaginosis 03/12/2014  . Hypercalcemia 10/15/2014  . Hypoxia 10/15/2014  . Fall at home 12/04/2014    Past Surgical History  Procedure Laterality Date  . Appendectomy  1951  . Tubal ligation  1968  . Tonsillectomy  1950    There were no vitals filed for this visit.  Visit Diagnosis:  Abnormal posture - Plan: PT plan of care cert/re-cert  Functional gait abnormality - Plan: PT plan of care cert/re-cert  Unsteadiness - Plan: PT plan of care cert/re-cert  Weakness of both lower extremities - Plan: PT plan of care cert/re-cert      Subjective Assessment - 12/11/14 1202    Subjective Patient reports that she has fallen twice in the past 6 months with burising but no borken bones. MD referred pt to PT to work on strengthening for legs.    Pertinent History Chronic asthma and bronchitis on O2 most of the day - off sometimes at home always uses O2 when out or when active. She has a history of falling. Hx of arthritis; knee problems fom arthritis and loose "cartilage". Denies neurological problems    How long can you sit comfortably? 2 hours   How long can you stand comfortably? 5 min    How long can you walk comfortably? 5- 10 min slowly    Diagnostic tests xrays/CT scan in ED followingfall  WFL's    Patient Stated Goals strengthening legs; getting in and out of low chair easier   Currently in Pain? Yes   Pain Score 5    Pain Location Knee   Pain Orientation Right;Anterior   Pain Descriptors / Indicators Aching;Nagging   Pain Type Chronic pain   Pain Onset 1 to 4 weeks ago   Pain Frequency Intermittent   Aggravating Factors  standing; walking; shopping more 30-45 min with cart to hold    Pain Relieving Factors rest; heating pad            [facility] PT Assessment - 12/11/14 0001    Assessment   Medical  Diagnosis Recurrent falls    Referring Provider Dr. Willette Alma   Onset Date/Surgical Date 11/21/14   Hand Dominance Right   Next MD Visit 12/16   Prior Therapy PT for knee ~ 10 years ago    Precautions   Precautions None   Balance Screen   Has the patient fallen in the past 6 months Yes   How many times? 2   Has the patient had a decrease in activity level because of a fear of falling?  Yes   Is the patient reluctant to leave their home because of a fear of falling?  No   Home Environment   Additional Comments single level home with ramp    Prior Function   Level of Independence Independent with basic ADLs  lives alone   Vocation Retired   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    Leisure household chores minimal cooking  lives alone - TV    Sensation   Additional Comments WFL's    Posture/Postural Control   Posture Comments Head forward; incresaed thoracic kyphosis; decresed lumbar lordosis; flexed forward at hips; wt shifted to Lt in standing    AROM   Overall AROM Comments limited hip mobility at end ranges throughout; hip extension (-)15-18 deg    Right Knee Extension -11   Right Knee Flexion 100   Left Knee Extension -3   Left Knee Flexion 111   Strength   Overall Strength --  Pt unable to lie sidelying or prone for strength testing    Overall Strength Comments Rt LE 4/5; Lt 4+/5   assessed sitting and supine   Bed Mobility   Bed Mobility --  dificulty moving sit to supine and supine to sit    Ambulation/Gait   Gait Comments slowed gait with decreesd wt bearing through Rt LE decreased wt shift and stance phase on Rt compared to LT LE flexed forward at hips    Berg Balance Test   Sit to Stand Able to stand  independently using hands  hands pushing up on thighs   Standing Unsupported Able to stand safely 2 minutes   Sitting with Back Unsupported but Feet Supported on Floor or Stool Able to sit safely and securely 2 minutes   Stand to Sit Controls descent by using hands  hands on thigh for support    Transfers Able to transfer safely, minor use of hands   Standing Unsupported with Eyes Closed Able to stand 10 seconds safely   Standing Ubsupported with Feet Together Able to place feet together independently and stand 1 minute safely   From Standing, Reach Forward with Outstretched Arm Can reach confidently >25 cm (10")   From Standing Position, Pick up Object from Floor Able to pick up shoe safely and easily   From Standing Position, Turn to Look Behind Over each Shoulder Looks behind from both sides and weight shifts well   Turn 360 Degrees Able to turn 360 degrees safely but slowly   Standing Unsupported, Alternately Place Feet on Step/Stool Able to complete >2 steps/needs minimal assist    Standing Unsupported, One Foot in Front Needs help to step but can hold 15 seconds   Standing on One Leg Tries to lift leg/unable to hold 3 seconds but remains standing independently   Total Score 43                   [facility] Adult PT Treatment/Exercise - 12/11/14 0001  Knee/Hip Exercises: Standing   Other Standing Knee Exercises standing at counter extend spine to engage erector spinae    Knee/Hip Exercises: Seated   Other Seated Knee/Hip Exercises cow forward bend    Knee/Hip Exercises: Supine   Bridges Both;5 reps  10 sec hold                 PT Education - 12/11/14 1246    Education provided Yes   Education Details postureal correction; HEP    Person(s) Educated Patient   Methods Explanation;Demonstration;Tactile cues;Verbal cues;Handout   Comprehension Verbalized understanding;Returned demonstration;Verbal cues required;Tactile cues required             PT Long Term Goals - 12/11/14 1325    PT LONG TERM GOAL #1   Title Improve posture and alignment with patient to demonstrate more upright posture with standing and walking 01/24/15   Time 6   Period Weeks   Status New   PT LONG TERM GOAL #2   Title Improve ROM in Rt knee extension and bilat hip extension by 3-5 degrees 01/24/15   Time 6   Period Weeks   Status New   PT LONG TERM GOAL #3   Title Patient reports being able to rise from lower seated position with minimal to no difficulty 01/24/15   Time 6   Period Weeks   Status New   PT LONG TERM GOAL #4   Title Increase strength bilat LE's to 4+/5 to 5/5 01/24/15   Time 6   Period Weeks   Status New   PT LONG TERM GOAL #5   Title Improve Berg balance scale by 8 points 01/24/15   Time 6   Period Weeks   Status New               Plan - 12/11/14 1319    Clinical Impression Statement [name] presents with history of recurrent falls and c/o weakness in legs and pain in the Rt > Lt knees. She has limited ROM bilat LE's; decresaed stength; poor  posture and alignment; abnormal gait pattern; poor balance for higher level activities per [employer] Scale. She will benefit form PT to address problems identified; improve functional level and safety with standing and walking.    Pt will benefit from skilled therapeutic intervention in order to improve on the following deficits Postural dysfunction;Improper body mechanics;Abnormal gait;Decreased range of motion;Decreased strength;Decreased endurance;Decreased activity tolerance;Decreased balance;Pain   Rehab Potential Good   PT Frequency 2x / week   PT Duration 6 weeks   PT Treatment/Interventions Patient/family education;ADLs/Self Care Home Management;Therapeutic activities;Therapeutic exercise;Manual techniques;Neuromuscular re-education;Balance training;Gait training;Stair training;Cryotherapy;Moist Heat;Electrical Stimulation;Ultrasound;Dry needling   PT Next Visit Plan Progress with balance and gait training; LE strengthening    PT Home Exercise Plan HEP   Consulted and Agree with Plan of Care Patient         Problem List Patient Active Problem List   Diagnosis Date Noted  . Fall at home 12/04/2014  . Hypercalcemia 10/15/2014  . Hypoxia 10/15/2014  . Anemia 03/12/2014  . Cervical cancer screening 02/28/2014  . Eustachian tube dysfunction 01/29/2014  . Medicare annual wellness visit, subsequent 12/04/2013  . Constipation 06/05/2013  . Tachycardia 02/23/2013  . Sun-damaged skin 10/24/2012  . UTI (lower urinary tract infection) 10/24/2012  . Back pain 04/07/2011  . Allergic rhinitis 05/23/2009  . Gout 01/11/2008  . VARICOSE VEINS, LOWER EXTREMITIES 06/01/2007  . ADJ DISORDER WITH MIXED ANXIETY & DEPRESSED MOOD 03/25/2007  . Hyperlipidemia, mixed 12/29/2006  .  Essential hypertension 12/29/2006  . Disorder of bone and cartilage 12/29/2006  . PELVIC PAIN, RIGHT 12/29/2006  . Diabetes type 2, controlled ([place]) 12/29/2006  . Asthma, moderate persistent 08/21/2006  . GERD  08/21/2006  . OSTEOARTHRITIS 08/21/2006  . DVT, HX OF 08/21/2006    Neal Trulson Nilda Simmer PT, MPH 12/11/2014, 1:37 PM  [facility] [place], [place], [address] Phone: [phone]   Fax:  [phone]  Name: [name] MRN: [id number] Date of Birth: September 13, 1940

## 2014-12-19 ENCOUNTER — Ambulatory Visit (INDEPENDENT_AMBULATORY_CARE_PROVIDER_SITE_OTHER): Payer: Commercial Managed Care - HMO | Admitting: Physical Therapy

## 2014-12-19 DIAGNOSIS — R29898 Other symptoms and signs involving the musculoskeletal system: Secondary | ICD-10-CM

## 2014-12-19 DIAGNOSIS — R2681 Unsteadiness on feet: Secondary | ICD-10-CM | POA: Diagnosis not present

## 2014-12-19 DIAGNOSIS — R293 Abnormal posture: Secondary | ICD-10-CM | POA: Diagnosis not present

## 2014-12-19 DIAGNOSIS — R2689 Other abnormalities of gait and mobility: Secondary | ICD-10-CM | POA: Diagnosis not present

## 2014-12-19 NOTE — Patient Instructions (Addendum)
Sit to Stand / Stand to Sit / Transfers    Sit on edge of a solid chair with arms, feet flat on floor. Lean forward over feet and stand up with hands on chair arms. Sit down slowly with hands on chair arms. Repeat __5-10__ times per session. Do _2___ sessions per day. ABDUCTION: Sitting - Exercise Ball: Resistance Band (Active)    Sit with feet flat. Lift right leg slightly and, against green resistance band, draw it out to side. Complete _1-2__ sets of 10___ repetitions. Perform _1__ sessions per day.  Heel Raises    Stand with support.  With knees straight, raise heels off ground. Hold _1__ seconds. Relax for _1__ seconds. Repeat _5-101__ times. Do _1__ times a day.   Calf Stretch    Place hands on wall at shoulder height. Keeping back leg straight, bend front leg, feet pointing forward, heels flat on floor. Lean forward slightly until stretch is felt in calf of back leg. Hold stretch ___ seconds, breathing slowly in and out. Repeat stretch with other leg back. Do ___ sessions per day. Variation: Use chair or table for support.   [facility] [place], [place] [address]  [phone] (office) [phone] (fax)

## 2014-12-19 NOTE — Therapy (Signed)
[place] [address] [place], [place], [address] Phone: [phone]   Fax:  [phone]  Physical Therapy Treatment  Patient Details  Name: [name] MRN: [id number] Date of Birth: 1940-04-16 Referring Provider: Dr. Willette Alma  Encounter Date: 12/19/2014      PT End of Session - 12/19/14 1200    Visit Number 2   Number of Visits 12   Date for PT Re-Evaluation 01/24/15   PT Start Time [id number]   PT Stop Time 1237   PT Time Calculation (min) 39 min      Past Medical History  Diagnosis Date  . Asthma     PFT 02/06/09 FEV1 1.41 (65%), FVC 1.92 (64), FEV1% 74, TLC 3.47 (71%), DLCO 48%, +BD  . ACE-inhibitor cough   . DVT (deep venous thrombosis) ([place]) 1987  . GERD (gastroesophageal reflux disease)   . Arthritis   . Atypical chest pain     s/p cath, Normal coronaries, Non ST elevation myocardial infarction, Rt groin pseudoaneurysm  . Hyperlipidemia   . Hypertension   . Depression   . Gout   . Abnormal glucose tolerance test   . Sun-damaged skin 10/24/2012  . Urinary frequency 10/24/2012  . Unspecified constipation 06/05/2013  . UTI (lower urinary tract infection) 10/24/2012  . Pneumonia   . COPD (chronic obstructive pulmonary disease) ([place])   . Anemia 03/12/2014  . Bacterial vaginosis 03/12/2014  . Hypercalcemia 10/15/2014  . Hypoxia 10/15/2014  . Fall at home 12/04/2014    Past Surgical History  Procedure Laterality Date  . Appendectomy  1951  . Tubal ligation  1968  . Tonsillectomy  1950    There were no vitals filed for this visit.  Visit Diagnosis:  Abnormal posture  Functional gait abnormality  Unsteadiness  Weakness of both lower extremities      Subjective Assessment - 12/19/14 1202    Subjective Pt reports she hasn't done her exercises as much as she would like due to being busy for holidays.    Currently in Pain? Yes   Pain Score 5    Pain Location Knee   Pain Orientation  Right;Anterior   Pain Descriptors / Indicators Dull;Aching   Aggravating Factors  moving it wrong way    Pain Relieving Factors rest, heating pad            [facility] PT Assessment - 12/19/14 0001    Assessment   Medical Diagnosis Recurrent falls    Referring Provider Dr. Willette Alma   Onset Date/Surgical Date 11/21/14   Hand Dominance Right   Next MD Visit 12/16          [facility] Adult PT Treatment/Exercise - 12/19/14 0001    Exercises   Exercises Shoulder;Knee/Hip   Knee/Hip Exercises: Stretches   Passive Hamstring Stretch Right;Left;2 reps;20 seconds   Gastroc Stretch Right;Left;3 reps;20 seconds   Knee/Hip Exercises: Aerobic   Nustep L2: 6 min    Knee/Hip Exercises: Standing   Heel Raises Both;1 set;10 reps  with toe raises, UE support   Other Standing Knee Exercises Standing hip/lumbar ext (hands on hips) x 15 sec x 3 reps    Knee/Hip Exercises: Seated   Long Arc Quad Right;Left;1 set;10 reps  5 sec hold in extension   Ball Squeeze 5 sec hold x 10 reps    Clamshell with TheraBand Green  10 reps x 2 sets   Marching Limitations seated marching x 20 steps x 2 sets    Sit  to [facility] with UE support;without UE support;15 reps;Other (comment)  VC for forward chest posture during ascent/decent. 3 sets 5   Shoulder Exercises: Seated   Row Both;10 reps;Theraband   Theraband Level (Shoulder Row) Level 1 (Yellow)   External Rotation Both;10 reps;Theraband   Theraband Level (Shoulder External Rotation) Level 1 (Yellow)                PT Education - 12/19/14 1244    Education provided Yes   Education Details HEP.  Pt issued yellow band for seated hip abd.    Person(s) Educated Patient   Methods Explanation;Handout   Comprehension Verbalized understanding;Returned demonstration             PT Long Term Goals - 12/11/14 1325    PT LONG TERM GOAL #1   Title Improve posture and alignment with patient to demonstrate more upright posture with standing and walking  01/24/15   Time 6   Period Weeks   Status New   PT LONG TERM GOAL #2   Title Improve ROM in Rt knee extension and bilat hip extension by 3-5 degrees 01/24/15   Time 6   Period Weeks   Status New   PT LONG TERM GOAL #3   Title Patient reports being able to rise from lower seated position with minimal to no difficulty 01/24/15   Time 6   Period Weeks   Status New   PT LONG TERM GOAL #4   Title Increase strength bilat LE's to 4+/5 to 5/5 01/24/15   Time 6   Period Weeks   Status New   PT LONG TERM GOAL #5   Title Improve Berg balance scale by 8 points 01/24/15   Time 6   Period Weeks   Status New               Plan - 12/19/14 1241    Clinical Impression Statement Pt required frequent cues for improved balance with sit to/from stands.  Pt tolerated all exercises well, able to complete set of 10 of each.  Pt noted increased tightness/pain in Rt ankle and knee with gentle stretches. Need cues not to bounce during stretches.  Pt will benefit from continued PT intervention to increase functional mobility and safety.    Pt will benefit from skilled therapeutic intervention in order to improve on the following deficits Postural dysfunction;Improper body mechanics;Abnormal gait;Decreased range of motion;Decreased strength;Decreased endurance;Decreased activity tolerance;Decreased balance;Pain   Rehab Potential Good   PT Frequency 2x / week   PT Duration 6 weeks   PT Treatment/Interventions Patient/family education;ADLs/Self Care Home Management;Therapeutic activities;Therapeutic exercise;Manual techniques;Neuromuscular re-education;Balance training;Gait training;Stair training;Cryotherapy;Moist Heat;Electrical Stimulation;Ultrasound;Dry needling   PT Next Visit Plan Progress with balance and gait training; LE strengthening    Consulted and Agree with Plan of Care Patient        Problem List Patient Active Problem List   Diagnosis Date Noted  . Fall at home 12/04/2014  . Hypercalcemia  10/15/2014  . Hypoxia 10/15/2014  . Anemia 03/12/2014  . Cervical cancer screening 02/28/2014  . Eustachian tube dysfunction 01/29/2014  . Medicare annual wellness visit, subsequent 12/04/2013  . Constipation 06/05/2013  . Tachycardia 02/23/2013  . Sun-damaged skin 10/24/2012  . UTI (lower urinary tract infection) 10/24/2012  . Back pain 04/07/2011  . Allergic rhinitis 05/23/2009  . Gout 01/11/2008  . VARICOSE VEINS, LOWER EXTREMITIES 06/01/2007  . ADJ DISORDER WITH MIXED ANXIETY & DEPRESSED MOOD 03/25/2007  . Hyperlipidemia, mixed 12/29/2006  . Essential hypertension  12/29/2006  . Disorder of bone and cartilage 12/29/2006  . PELVIC PAIN, RIGHT 12/29/2006  . Diabetes type 2, controlled ([place]) 12/29/2006  . Asthma, moderate persistent 08/21/2006  . GERD 08/21/2006  . OSTEOARTHRITIS 08/21/2006  . DVT, HX OF 08/21/2006   Kerin Perna, PTA 12/19/2014 12:52 PM  [place], [place], [address] Phone: [phone]   Fax:  [phone]  Name: [name] MRN: [id number] Date of Birth: 26-Jan-1940

## 2014-12-21 ENCOUNTER — Ambulatory Visit (INDEPENDENT_AMBULATORY_CARE_PROVIDER_SITE_OTHER): Payer: Commercial Managed Care - HMO | Admitting: Physical Therapy

## 2014-12-21 DIAGNOSIS — R29898 Other symptoms and signs involving the musculoskeletal system: Secondary | ICD-10-CM

## 2014-12-21 DIAGNOSIS — R2689 Other abnormalities of gait and mobility: Secondary | ICD-10-CM | POA: Diagnosis not present

## 2014-12-21 DIAGNOSIS — R293 Abnormal posture: Secondary | ICD-10-CM

## 2014-12-21 DIAGNOSIS — R2681 Unsteadiness on feet: Secondary | ICD-10-CM

## 2014-12-21 NOTE — Patient Instructions (Signed)
Pursed Lip Breathing    Breathe slowly and gently in through nose and out through pursed lips (as if making a candle flame flicker, or blowing a hair off your lip). Do not force the air out. Breathe out for at least twice as long as you breathe in.    [facility] at [facility] [place], [place] [address]  [phone] (office) [phone] (fax)

## 2014-12-21 NOTE — Therapy (Signed)
[place], [place], [address] Phone: [phone]   Fax:  [phone]  Physical Therapy Treatment  Patient Details  Name: [name] MRN: [id number] Date of Birth: 1940/11/27 Referring Provider: Dr. Willette Alma  Encounter Date: 12/21/2014      PT End of Session - 12/21/14 1120    Visit Number 3   Number of Visits 12   Date for PT Re-Evaluation 01/24/15   PT Start Time 1120   PT Stop Time 1159   PT Time Calculation (min) 39 min      Past Medical History  Diagnosis Date  . Asthma     PFT 02/06/09 FEV1 1.41 (65%), FVC 1.92 (64), FEV1% 74, TLC 3.47 (71%), DLCO 48%, +BD  . ACE-inhibitor cough   . DVT (deep venous thrombosis) ([place]) 1987  . GERD (gastroesophageal reflux disease)   . Arthritis   . Atypical chest pain     s/p cath, Normal coronaries, Non ST elevation myocardial infarction, Rt groin pseudoaneurysm  . Hyperlipidemia   . Hypertension   . Depression   . Gout   . Abnormal glucose tolerance test   . Sun-damaged skin 10/24/2012  . Urinary frequency 10/24/2012  . Unspecified constipation 06/05/2013  . UTI (lower urinary tract infection) 10/24/2012  . Pneumonia   . COPD (chronic obstructive pulmonary disease) ([place])   . Anemia 03/12/2014  . Bacterial vaginosis 03/12/2014  . Hypercalcemia 10/15/2014  . Hypoxia 10/15/2014  . Fall at home 12/04/2014    Past Surgical History  Procedure Laterality Date  . Appendectomy  1951  . Tubal ligation  1968  . Tonsillectomy  1950    There were no vitals filed for this visit.  Visit Diagnosis:  Abnormal posture  Weakness of both lower extremities  Functional gait abnormality  Unsteadiness      Subjective Assessment - 12/21/14 1123    Subjective "I was doing that seated marching and I had sharp pain in Rt knee, I couldn't straighten my knee out for a while". Pt reports she was tired after last session, took nap afterward.  "I can tell it's  making me stronger"    Currently in Pain? Yes   Pain Score 5    Pain Location Knee   Pain Orientation Right;Anterior            [facility] PT Assessment - 12/21/14 0001    Assessment   Medical Diagnosis Recurrent falls    Onset Date/Surgical Date 11/21/14   Hand Dominance Right   Next MD Visit 12/16          [facility] Adult PT Treatment/Exercise - 12/21/14 0001    Knee/Hip Exercises: Stretches   Passive Hamstring Stretch Right;Left;2 reps;20 seconds   Quad Stretch Right;Left;2 reps;20 seconds  seated    Gastroc Stretch Right;Left;3 reps;20 seconds   Knee/Hip Exercises: Aerobic   Nustep L2: 5 min    Knee/Hip Exercises: Standing   Heel Raises Both;1 set;10 reps  with toe raises, UE support   Knee/Hip Exercises: Seated   Ball Squeeze 5 sec hold x 10 reps x 2 sets   Clamshell with TheraBand Green, x 10 reps x 2 sets    Sit to [place] 5 reps;without UE support           PT Education - 12/21/14 1316    Education provided Yes   Education Details HEP - hamstring and quad stretch, issued green band for seated hip abd.   Info given on  modified borg and pursed lipped breathing.    Person(s) Educated Patient   Methods Handout   Comprehension Verbalized understanding             PT Long Term Goals - 12/11/14 1325    PT LONG TERM GOAL #1   Title Improve posture and alignment with patient to demonstrate more upright posture with standing and walking 01/24/15   Time 6   Period Weeks   Status New   PT LONG TERM GOAL #2   Title Improve ROM in Rt knee extension and bilat hip extension by 3-5 degrees 01/24/15   Time 6   Period Weeks   Status New   PT LONG TERM GOAL #3   Title Patient reports being able to rise from lower seated position with minimal to no difficulty 01/24/15   Time 6   Period Weeks   Status New   PT LONG TERM GOAL #4   Title Increase strength bilat LE's to 4+/5 to 5/5 01/24/15   Time 6   Period Weeks   Status New   PT LONG TERM GOAL #5   Title Improve Berg  balance scale by 8 points 01/24/15   Time 6   Period Weeks   Status New               Plan - 12/21/14 1317    Clinical Impression Statement Pt demonstrated improved form with sit to from stands.  Pt required frequent seated rest breaks due to dyspnea, only able to complete sets of 5 reps standing, but sets fo 10 reps if seated .  Able to return demo of pursed lipped breathing after education.   slowly progressing towards goals.    Pt will benefit from skilled therapeutic intervention in order to improve on the following deficits Postural dysfunction;Improper body mechanics;Abnormal gait;Decreased range of motion;Decreased strength;Decreased endurance;Decreased activity tolerance;Decreased balance;Pain   Rehab Potential Good   PT Frequency 2x / week   PT Duration 6 weeks   PT Treatment/Interventions Patient/family education;ADLs/Self Care Home Management;Therapeutic activities;Therapeutic exercise;Manual techniques;Neuromuscular re-education;Balance training;Gait training;Stair training;Cryotherapy;Moist Heat;Electrical Stimulation;Ultrasound;Dry needling   PT Next Visit Plan Progress with balance and gait training; LE strengthening    Consulted and Agree with Plan of Care Patient        Problem List Patient Active Problem List   Diagnosis Date Noted  . Fall at home 12/04/2014  . Hypercalcemia 10/15/2014  . Hypoxia 10/15/2014  . Anemia 03/12/2014  . Cervical cancer screening 02/28/2014  . Eustachian tube dysfunction 01/29/2014  . Medicare annual wellness visit, subsequent 12/04/2013  . Constipation 06/05/2013  . Tachycardia 02/23/2013  . Sun-damaged skin 10/24/2012  . UTI (lower urinary tract infection) 10/24/2012  . Back pain 04/07/2011  . Allergic rhinitis 05/23/2009  . Gout 01/11/2008  . VARICOSE VEINS, LOWER EXTREMITIES 06/01/2007  . ADJ DISORDER WITH MIXED ANXIETY & DEPRESSED MOOD 03/25/2007  . Hyperlipidemia, mixed 12/29/2006  . Essential hypertension 12/29/2006  .  Disorder of bone and cartilage 12/29/2006  . PELVIC PAIN, RIGHT 12/29/2006  . Diabetes type 2, controlled ([place]) 12/29/2006  . Asthma, moderate persistent 08/21/2006  . GERD 08/21/2006  . OSTEOARTHRITIS 08/21/2006  . DVT, HX OF 08/21/2006    Kerin Perna, PTA 12/21/2014 1:23 PM  [facility] [place], [place], [address] Phone: [phone]   Fax:  [phone]  Name: [name] MRN: [id number] Date of Birth: June 22, 1940

## 2014-12-26 ENCOUNTER — Encounter: Payer: Commercial Managed Care - HMO | Admitting: Rehabilitative and Restorative Service Providers"

## 2014-12-28 ENCOUNTER — Ambulatory Visit (INDEPENDENT_AMBULATORY_CARE_PROVIDER_SITE_OTHER): Payer: Commercial Managed Care - HMO | Admitting: Physical Therapy

## 2014-12-28 DIAGNOSIS — R2681 Unsteadiness on feet: Secondary | ICD-10-CM

## 2014-12-28 DIAGNOSIS — R2689 Other abnormalities of gait and mobility: Secondary | ICD-10-CM

## 2014-12-28 DIAGNOSIS — R29898 Other symptoms and signs involving the musculoskeletal system: Secondary | ICD-10-CM

## 2014-12-28 DIAGNOSIS — R293 Abnormal posture: Secondary | ICD-10-CM

## 2014-12-28 NOTE — Therapy (Signed)
[place], [place], [address] Phone: [phone]   Fax:  [phone]  Physical Therapy Treatment  Patient Details  Name: [name] MRN: [id number] Date of Birth: 26-Feb-1940 Referring Provider: Dr. Willette Alma  Encounter Date: 12/28/2014      PT End of Session - 12/28/14 1157    Visit Number 4   Number of Visits 12   Date for PT Re-Evaluation 01/24/15   PT Start Time [id number]  pt arrived late   PT Stop Time 1235   PT Time Calculation (min) 41 min   Activity Tolerance Patient tolerated treatment well      Past Medical History  Diagnosis Date  . Asthma     PFT 02/06/09 FEV1 1.41 (65%), FVC 1.92 (64), FEV1% 74, TLC 3.47 (71%), DLCO 48%, +BD  . ACE-inhibitor cough   . DVT (deep venous thrombosis) ([place]) 1987  . GERD (gastroesophageal reflux disease)   . Arthritis   . Atypical chest pain     s/p cath, Normal coronaries, Non ST elevation myocardial infarction, Rt groin pseudoaneurysm  . Hyperlipidemia   . Hypertension   . Depression   . Gout   . Abnormal glucose tolerance test   . Sun-damaged skin 10/24/2012  . Urinary frequency 10/24/2012  . Unspecified constipation 06/05/2013  . UTI (lower urinary tract infection) 10/24/2012  . Pneumonia   . COPD (chronic obstructive pulmonary disease) ([place])   . Anemia 03/12/2014  . Bacterial vaginosis 03/12/2014  . Hypercalcemia 10/15/2014  . Hypoxia 10/15/2014  . Fall at home 12/04/2014    Past Surgical History  Procedure Laterality Date  . Appendectomy  1951  . Tubal ligation  1968  . Tonsillectomy  1950    There were no vitals filed for this visit.  Visit Diagnosis:  Abnormal posture  Weakness of both lower extremities  Functional gait abnormality  Unsteadiness      Subjective Assessment - 12/28/14 1158    Subjective "My legs are starting to feel alive.  I'm having a much easier time getting up from sitting. Even my grandson said I'm  moving better".  Pt reports improvement of 35%.  No falls over last wk.    Patient Stated Goals strengthening legs; getting in and out of low chair easier   Currently in Pain? Yes   Pain Score 3    Pain Location Knee   Pain Orientation Right   Pain Descriptors / Indicators Dull   Aggravating Factors  moving it wrong way   Pain Relieving Factors rest, heating            [facility] PT Assessment - 12/28/14 0001    Assessment   Medical Diagnosis Recurrent falls    Onset Date/Surgical Date 11/21/14   Hand Dominance Right   Next MD Visit 01/01/15   ROM / Strength   AROM / PROM / Strength AROM;Strength   AROM   Right/Left Knee Right;Left   Right Knee Extension -8   Right Knee Flexion 111   Left Knee Extension -3   Left Knee Flexion 117   Strength   Strength Assessment Site Knee   Right/Left Knee Right;Left   Right Knee Flexion --  5-/5   Right Knee Extension --  5-/5   Left Knee Flexion 4+/5   Left Knee Extension --  5-/5          [facility] Adult PT Treatment/Exercise - 12/28/14 0001    Knee/Hip Exercises: Stretches   Passive Hamstring  Stretch Right;Left;2 reps;20 seconds  seated, back straight   Quad Stretch Right;Left;2 reps;20 seconds  seated    Gastroc Stretch Right;Left;3 reps;20 seconds   Knee/Hip Exercises: Aerobic   Nustep L3: 5 min    Knee/Hip Exercises: Standing   Heel Raises Both;3 sets;5 reps  with toe raises, UE support   Shoulder Exercises: Seated   External Rotation Strengthening;Both;Theraband;15 reps  2 sets   Theraband Level (Shoulder External Rotation) Level 1 (Yellow)   Other Seated Exercises Resisted elbow flexion with yellow band (bilat) x 10 reps; seated row with yellow band (anchored at foot) x 10            PT Education - 12/28/14 1225    Education provided Yes   Education Details HEP - added shoulder postural strengthening   Person(s) Educated Patient   Methods Handout;Explanation   Comprehension Verbalized understanding;Returned  demonstration             PT Long Term Goals - 12/28/14 1243    PT LONG TERM GOAL #1   Title Improve posture and alignment with patient to demonstrate more upright posture with standing and walking 01/24/15   Time 6   Period Weeks   Status On-going   PT LONG TERM GOAL #2   Title Improve ROM in Rt knee extension and bilat hip extension by 3-5 degrees 01/24/15   Time 6   Period Weeks   Status On-going   PT LONG TERM GOAL #3   Title Patient reports being able to rise from lower seated position with minimal to no difficulty 01/24/15   Time 6   Period Weeks   Status On-going   PT LONG TERM GOAL #4   Title Increase strength bilat LE's to 4+/5 to 5/5 01/24/15   Time 6   Period Weeks   Status On-going   PT LONG TERM GOAL #5   Title Improve Berg balance scale by 8 points 01/24/15   Time 6   Period Weeks   Status On-going               Plan - 12/28/14 1238    Clinical Impression Statement Pt demonstrated improved activity tolerance; able to move to next exercise with minimal rest breaks.  Noted improved spO2 to 94-95% at rest, compared to 90-92% at rest last session. Pt demonstrated slight improvement in Rt/Lt knee ROM; progressing towards LTG#2.   Making good gains towards remaining goals.    Pt will benefit from skilled therapeutic intervention in order to improve on the following deficits Postural dysfunction;Improper body mechanics;Abnormal gait;Decreased range of motion;Decreased strength;Decreased endurance;Decreased activity tolerance;Decreased balance;Pain   Rehab Potential Good   PT Frequency 2x / week   PT Duration 6 weeks   PT Treatment/Interventions Patient/family education;ADLs/Self Care Home Management;Therapeutic activities;Therapeutic exercise;Manual techniques;Neuromuscular re-education;Balance training;Gait training;Stair training;Cryotherapy;Moist Heat;Electrical Stimulation;Ultrasound;Dry needling   PT Next Visit Plan Progress with balance and gait training; LE  strengthening    Consulted and Agree with Plan of Care Patient        Problem List Patient Active Problem List   Diagnosis Date Noted  . Fall at home 12/04/2014  . Hypercalcemia 10/15/2014  . Hypoxia 10/15/2014  . Anemia 03/12/2014  . Cervical cancer screening 02/28/2014  . Eustachian tube dysfunction 01/29/2014  . Medicare annual wellness visit, subsequent 12/04/2013  . Constipation 06/05/2013  . Tachycardia 02/23/2013  . Sun-damaged skin 10/24/2012  . UTI (lower urinary tract infection) 10/24/2012  . Back pain 04/07/2011  . Allergic rhinitis 05/23/2009  .  Gout 01/11/2008  . VARICOSE VEINS, LOWER EXTREMITIES 06/01/2007  . ADJ DISORDER WITH MIXED ANXIETY & DEPRESSED MOOD 03/25/2007  . Hyperlipidemia, mixed 12/29/2006  . Essential hypertension 12/29/2006  . Disorder of bone and cartilage 12/29/2006  . PELVIC PAIN, RIGHT 12/29/2006  . Diabetes type 2, controlled ([place]) 12/29/2006  . Asthma, moderate persistent 08/21/2006  . GERD 08/21/2006  . OSTEOARTHRITIS 08/21/2006  . DVT, HX OF 08/21/2006   Kerin Perna, PTA 12/28/2014 12:46 PM   [place], [place], [address] Phone: [phone]   Fax:  [phone]  Name: [name] MRN: [id number] Date of Birth: 04/27/1940

## 2014-12-28 NOTE — Patient Instructions (Signed)
Rowing: Resisted (Sitting)    SEATED IN CHAIR : with resistive band around feet, hands firmly holding ends. Pull elbows back. Repeat _10___ times per set. Do _2___ sets per session. Do _1___ sessions per day.  http://[email or web address].us/185   Elbow Flexion: Resisted    With tubing wrapped around left fist and other end secured under foot, curl arm up as far as possible. Repeat _10___ times per set. Do __2__ sets per session. Do _1___ sessions per day.  Resisted External Rotation: in Neutral - Bilateral    Sit or stand, tubing in both hands, elbows at sides, bent to 90, forearms forward. Pinch shoulder blades together and rotate forearms out. Keep elbows at sides. Repeat _10___ times per set. Do __2__ sets per session. Do _1___ sessions per day.  http://orth.exer.us/967   [facility] [place], [place] [address]  [phone] (office) [phone] (fax)

## 2015-01-01 ENCOUNTER — Encounter: Payer: Self-pay | Admitting: Family Medicine

## 2015-01-01 ENCOUNTER — Ambulatory Visit (INDEPENDENT_AMBULATORY_CARE_PROVIDER_SITE_OTHER): Payer: Commercial Managed Care - HMO | Admitting: Family Medicine

## 2015-01-01 VITALS — BP 134/74 | HR 85 | Temp 98.5°F | Ht 65.0 in | Wt 221.4 lb

## 2015-01-01 DIAGNOSIS — E118 Type 2 diabetes mellitus with unspecified complications: Secondary | ICD-10-CM | POA: Diagnosis not present

## 2015-01-01 DIAGNOSIS — M10079 Idiopathic gout, unspecified ankle and foot: Secondary | ICD-10-CM

## 2015-01-01 DIAGNOSIS — I10 Essential (primary) hypertension: Secondary | ICD-10-CM

## 2015-01-01 DIAGNOSIS — E559 Vitamin D deficiency, unspecified: Secondary | ICD-10-CM

## 2015-01-01 DIAGNOSIS — R0902 Hypoxemia: Secondary | ICD-10-CM | POA: Diagnosis not present

## 2015-01-01 DIAGNOSIS — M109 Gout, unspecified: Secondary | ICD-10-CM

## 2015-01-01 DIAGNOSIS — D649 Anemia, unspecified: Secondary | ICD-10-CM | POA: Diagnosis not present

## 2015-01-01 DIAGNOSIS — R000 Tachycardia, unspecified: Secondary | ICD-10-CM

## 2015-01-01 DIAGNOSIS — E782 Mixed hyperlipidemia: Secondary | ICD-10-CM | POA: Diagnosis not present

## 2015-01-01 DIAGNOSIS — K219 Gastro-esophageal reflux disease without esophagitis: Secondary | ICD-10-CM

## 2015-01-01 DIAGNOSIS — M1000 Idiopathic gout, unspecified site: Secondary | ICD-10-CM | POA: Diagnosis not present

## 2015-01-01 HISTORY — DX: Vitamin D deficiency, unspecified: E55.9

## 2015-01-01 MED ORDER — ALBUTEROL SULFATE HFA 108 (90 BASE) MCG/ACT IN AERS: 2.0000 | INHALATION_SPRAY | RESPIRATORY_TRACT | Status: DC | PRN

## 2015-01-01 NOTE — Progress Notes (Signed)
Pre visit review using our clinic review tool, if applicable. No additional management support is needed unless otherwise documented below in the visit note. 

## 2015-01-01 NOTE — Patient Instructions (Signed)

## 2015-01-02 ENCOUNTER — Encounter: Payer: Commercial Managed Care - HMO | Admitting: Rehabilitative and Restorative Service Providers"

## 2015-01-02 ENCOUNTER — Other Ambulatory Visit: Payer: Self-pay | Admitting: Family Medicine

## 2015-01-02 DIAGNOSIS — D509 Iron deficiency anemia, unspecified: Secondary | ICD-10-CM

## 2015-01-02 LAB — LIPID PANEL
CHOL/HDL RATIO: 3
CHOLESTEROL: 153 mg/dL (ref 0–200)
HDL: 54 mg/dL (ref >39.00)
LDL Cholesterol: 80 mg/dL (ref 0–99)
NonHDL: 98.74
TRIGLYCERIDES: 95 mg/dL (ref 0.0–149.0)
VLDL: 19 mg/dL (ref 0.0–40.0)

## 2015-01-02 LAB — COMPREHENSIVE METABOLIC PANEL
ALBUMIN: 4.1 g/dL (ref 3.5–5.2)
ALK PHOS: 42 U/L (ref 39–117)
ALT: 12 U/L (ref 0–35)
AST: 18 U/L (ref 0–37)
BILIRUBIN TOTAL: 0.5 mg/dL (ref 0.2–1.2)
BUN: 19 mg/dL (ref 6–23)
CALCIUM: 10 mg/dL (ref 8.4–10.5)
CO2: 29 meq/L (ref 19–32)
CREATININE: 1.23 mg/dL — AB (ref 0.40–1.20)
Chloride: 106 mEq/L (ref 96–112)
GFR: 45.4 mL/min — AB (ref >60.00)
Glucose, Bld: 83 mg/dL (ref 70–99)
Potassium: 4.6 mEq/L (ref 3.5–5.1)
Sodium: 142 mEq/L (ref 135–145)
Total Protein: 6.9 g/dL (ref 6.0–8.3)

## 2015-01-02 LAB — CBC
HCT: 33.3 % — ABNORMAL LOW (ref 36.0–46.0)
HEMOGLOBIN: 10.8 g/dL — AB (ref 12.0–15.0)
MCHC: 32.5 g/dL (ref 30.0–36.0)
MCV: 94.5 fl (ref 78.0–100.0)
Platelets: 148 10*3/uL — ABNORMAL LOW (ref 150.0–400.0)
RBC: 3.52 Mil/uL — ABNORMAL LOW (ref 3.87–5.11)
RDW: 15.4 % (ref 11.5–15.5)
WBC: 8.2 10*3/uL (ref 4.0–10.5)

## 2015-01-02 LAB — URIC ACID: URIC ACID, SERUM: 5.6 mg/dL (ref 2.4–7.0)

## 2015-01-02 LAB — VITAMIN D 25 HYDROXY (VIT D DEFICIENCY, FRACTURES): VITD: 32.28 ng/mL (ref 30.00–100.00)

## 2015-01-02 LAB — PARATHYROID HORMONE, INTACT (NO CA): PTH: 20 pg/mL (ref 14–64)

## 2015-01-02 LAB — TSH: TSH: 2.12 u[IU]/mL (ref 0.35–4.50)

## 2015-01-02 MED ORDER — FERROUS FUMARATE-FOLIC ACID 324-1 MG PO TABS: 1.0000 | ORAL_TABLET | Freq: Every day | ORAL | Status: DC

## 2015-01-03 ENCOUNTER — Ambulatory Visit (INDEPENDENT_AMBULATORY_CARE_PROVIDER_SITE_OTHER): Payer: Commercial Managed Care - HMO | Admitting: Physical Therapy

## 2015-01-03 VITALS — HR 83

## 2015-01-03 DIAGNOSIS — R29898 Other symptoms and signs involving the musculoskeletal system: Secondary | ICD-10-CM

## 2015-01-03 DIAGNOSIS — R2681 Unsteadiness on feet: Secondary | ICD-10-CM | POA: Diagnosis not present

## 2015-01-03 DIAGNOSIS — R293 Abnormal posture: Secondary | ICD-10-CM

## 2015-01-03 DIAGNOSIS — R2689 Other abnormalities of gait and mobility: Secondary | ICD-10-CM

## 2015-01-03 NOTE — Therapy (Signed)
[place], [place], [address] Phone: [phone]   Fax:  [phone]  Physical Therapy Treatment  Patient Details  Name: [name] MRN: [id number] Date of Birth: Apr 06, 1940 Referring Provider: Dr. Willette Alma  Encounter Date: 01/03/2015      PT End of Session - 01/03/15 1111    Visit Number 5   Number of Visits 12   Date for PT Re-Evaluation 01/24/15   PT Start Time 1112  pt arrived late, restroom use prior to appt.    PT Stop Time 1158   PT Time Calculation (min) 46 min   Activity Tolerance Patient tolerated treatment well      Past Medical History  Diagnosis Date  . Asthma     PFT 02/06/09 FEV1 1.41 (65%), FVC 1.92 (64), FEV1% 74, TLC 3.47 (71%), DLCO 48%, +BD  . ACE-inhibitor cough   . DVT (deep venous thrombosis) ([place]) 1987  . GERD (gastroesophageal reflux disease)   . Arthritis   . Atypical chest pain     s/p cath, Normal coronaries, Non ST elevation myocardial infarction, Rt groin pseudoaneurysm  . Hyperlipidemia   . Hypertension   . Depression   . Gout   . Abnormal glucose tolerance test   . Sun-damaged skin 10/24/2012  . Urinary frequency 10/24/2012  . Unspecified constipation 06/05/2013  . UTI (lower urinary tract infection) 10/24/2012  . Pneumonia   . COPD (chronic obstructive pulmonary disease) ([place])   . Anemia 03/12/2014  . Bacterial vaginosis 03/12/2014  . Hypercalcemia 10/15/2014  . Hypoxia 10/15/2014  . Fall at home 12/04/2014  . Vitamin D deficiency 01/01/2015  . Osteopenia 12/29/2006    Qualifier: Diagnosis of  By: Wynona Luna     Past Surgical History  Procedure Laterality Date  . Appendectomy  1951  . Tubal ligation  1968  . Tonsillectomy  1950    Filed Vitals:   01/03/15 1115 01/03/15 1116  Pulse: 104 83  SpO2: 81% 93%    Visit Diagnosis:  Abnormal posture  Weakness of both lower extremities  Functional gait abnormality  Unsteadiness       Subjective Assessment - 01/03/15 1116    Subjective Went shopping at [employer] without rollator (uses it in community) and reports had very sore legs the next day. No falls over the last wk. Notes 50% improvement since beginning therapy.    Patient Stated Goals strengthening legs; getting in and out of low chair easier   Currently in Pain? Yes   Pain Score 8   no pain meds taken this morning   Pain Location Knee   Pain Orientation Right   Pain Descriptors / Indicators Sharp   Aggravating Factors  first getting up   Pain Relieving Factors rubbing knee, stretching it out.             [facility] PT Assessment - 01/03/15 0001    Assessment   Medical Diagnosis Recurrent falls    Onset Date/Surgical Date 11/21/14   Hand Dominance Right   Next MD Visit 01/01/15   Strength   Strength Assessment Site Knee   Right/Left Knee Right;Left   Right Knee Flexion --  5-/5   Right Knee Extension --  5-/5   Left Knee Flexion --  5-/5   Left Knee Extension --  5-/5          [facility] Adult PT Treatment/Exercise - 01/03/15 0001    Knee/Hip Exercises: Stretches   Passive Hamstring  Stretch Right;Left;2 reps;20 seconds  seated, back straight   Quad Stretch Right;Left;2 reps;20 seconds  seated    Gastroc Stretch Right;Left;3 reps  15 sec    Knee/Hip Exercises: Aerobic   Nustep L3: 8 min    Knee/Hip Exercises: Standing   Heel Raises Both;1 set;10 reps  and toe raises, with UE support   SLS 3 attempts each leg (without UE support), up to 3 sec on RLE and 7 sec on LLE    Other Standing Knee Exercises Standing hip/lumbar ext (hands on hips) x 5 sec x 10 reps.  Standing toe taps to 3" step without UE support x 5 reps x 3 sets    Other Standing Knee Exercises Tandem stance (heel toe) x 17 sec RLE/ 30 sec LLE   Shoulder Exercises: Seated   Extension Both;Strengthening;10 reps;Theraband   Theraband Level (Shoulder Extension) Level 1 (Yellow)   Row Both;10 reps;Theraband   Theraband Level (Shoulder  Row) Level 2 (Red)   External Rotation Strengthening;Both;Theraband;10 reps  2 sets   Theraband Level (Shoulder External Rotation) Level 1 (Yellow)   Other Seated Exercises Resisted elbow flexion with red band (bilat) x 10 reps;                      PT Long Term Goals - 12/28/14 1243    PT LONG TERM GOAL #1   Title Improve posture and alignment with patient to demonstrate more upright posture with standing and walking 01/24/15   Time 6   Period Weeks   Status On-going   PT LONG TERM GOAL #2   Title Improve ROM in Rt knee extension and bilat hip extension by 3-5 degrees 01/24/15   Time 6   Period Weeks   Status On-going   PT LONG TERM GOAL #3   Title Patient reports being able to rise from lower seated position with minimal to no difficulty 01/24/15   Time 6   Period Weeks   Status On-going   PT LONG TERM GOAL #4   Title Increase strength bilat LE's to 4+/5 to 5/5 01/24/15   Time 6   Period Weeks   Status On-going   PT LONG TERM GOAL #5   Title Improve Berg balance scale by 8 points 01/24/15   Time 6   Period Weeks   Status On-going               Plan - 01/03/15 1204    Clinical Impression Statement Pt demonstrated improved balance with balance exercises that she previously scored low on Berg test (able to perform tandem stance and alternating foot taps without UE support), as well as improved knee strength.  Making good progress towards established goals   Pt will benefit from skilled therapeutic intervention in order to improve on the following deficits Postural dysfunction;Improper body mechanics;Abnormal gait;Decreased range of motion;Decreased strength;Decreased endurance;Decreased activity tolerance;Decreased balance;Pain   Rehab Potential Good   PT Frequency 2x / week   PT Duration 6 weeks   PT Treatment/Interventions Patient/family education;ADLs/Self Care Home Management;Therapeutic activities;Therapeutic exercise;Manual techniques;Neuromuscular  re-education;Balance training;Gait training;Stair training;Cryotherapy;Moist Heat;Electrical Stimulation;Ultrasound;Dry needling   PT Next Visit Plan Progress with balance and LE strengthening    Consulted and Agree with Plan of Care Patient        Problem List Patient Active Problem List   Diagnosis Date Noted  . Fall at home 12/04/2014  . Hypercalcemia 10/15/2014  . Hypoxia 10/15/2014  . Anemia 03/12/2014  . Cervical cancer screening 02/28/2014  .  Eustachian tube dysfunction 01/29/2014  . Medicare annual wellness visit, subsequent 12/04/2013  . Constipation 06/05/2013  . Tachycardia 02/23/2013  . Sun-damaged skin 10/24/2012  . UTI (lower urinary tract infection) 10/24/2012  . Back pain 04/07/2011  . Allergic rhinitis 05/23/2009  . Gout 01/11/2008  . VARICOSE VEINS, LOWER EXTREMITIES 06/01/2007  . ADJ DISORDER WITH MIXED ANXIETY & DEPRESSED MOOD 03/25/2007  . Hyperlipidemia, mixed 12/29/2006  . Essential hypertension 12/29/2006  . Osteopenia 12/29/2006  . PELVIC PAIN, RIGHT 12/29/2006  . Diabetes type 2, controlled ([place]) 12/29/2006  . Asthma, moderate persistent 08/21/2006  . GERD 08/21/2006  . OSTEOARTHRITIS 08/21/2006  . DVT, HX OF 08/21/2006    Kerin Perna, PTA 01/03/2015 12:13 PM  [facility] [address] [place], [place], [address] Phone: [phone]   Fax:  [phone]  Name: KHALEE DEWINDT MRN: [id number] Date of Birth: [birth year]

## 2015-01-04 ENCOUNTER — Encounter: Payer: Commercial Managed Care - HMO | Admitting: Physical Therapy

## 2015-01-06 DIAGNOSIS — R0902 Hypoxemia: Secondary | ICD-10-CM | POA: Diagnosis not present

## 2015-01-06 DIAGNOSIS — J4520 Mild intermittent asthma, uncomplicated: Secondary | ICD-10-CM | POA: Diagnosis not present

## 2015-01-06 NOTE — Progress Notes (Signed)
Subjective:    Patient ID: [name], female    DOB: 05-15-1940, 74 y.o.   MRN: [id number]  Chief Complaint  Patient presents with  . Follow-up    HPI Patient is in today for follow-up. Continues to struggle with fatigue and anhedonia but no suicidal ideation. No recent febrile illness. No acute concerns. Denies polyuria or polydipsia and is trying to maintain a low carbohydrate diet. Denies CP/palp/SOB/HA/congestion/fevers/GI or GU c/o. Taking meds as prescribed  Past Medical History  Diagnosis Date  . Asthma     PFT 02/06/09 FEV1 1.41 (65%), FVC 1.92 (64), FEV1% 74, TLC 3.47 (71%), DLCO 48%, +BD  . ACE-inhibitor cough   . DVT (deep venous thrombosis) ([place]) 1987  . GERD (gastroesophageal reflux disease)   . Arthritis   . Atypical chest pain     s/p cath, Normal coronaries, Non ST elevation myocardial infarction, Rt groin pseudoaneurysm  . Hyperlipidemia   . Hypertension   . Depression   . Gout   . Abnormal glucose tolerance test   . Sun-damaged skin 10/24/2012  . Urinary frequency 10/24/2012  . Unspecified constipation 06/05/2013  . UTI (lower urinary tract infection) 10/24/2012  . Pneumonia   . COPD (chronic obstructive pulmonary disease) ([place])   . Anemia 03/12/2014  . Bacterial vaginosis 03/12/2014  . Hypercalcemia 10/15/2014  . Hypoxia 10/15/2014  . Fall at home 12/04/2014  . Vitamin D deficiency 01/01/2015  . Osteopenia 12/29/2006    Qualifier: Diagnosis of  By: Wynona Luna     Past Surgical History  Procedure Laterality Date  . Appendectomy  1951  . Tubal ligation  1968  . Tonsillectomy  1950    Family History  Problem Relation Age of Onset  . Asthma Sister   . Arthritis Sister   . Hypertension Sister   . Hyperlipidemia Sister   . Cancer Sister 67    uterine  . Uterine cancer Sister   . Coronary artery disease Brother     x2  . Cancer Brother     lung, smoker  . Arthritis Brother   . Coronary artery disease Other   . Hypertension Sister   .  Arthritis Sister   . Hyperlipidemia Sister   . Emphysema Sister   . COPD Sister     smoker  . Heart disease Sister   . Hyperlipidemia Sister   . Hypertension Sister   . Arthritis Sister   . Emphysema Brother   . Heart disease Brother     MI smoker  . Colon polyps Sister   . Mental illness Father   . Alcohol abuse Father   . Heart disease Mother     MI  . Hyperlipidemia Mother   . Epilepsy Daughter   . Hypertension Daughter   . Obesity Daughter   . COPD Brother     smoker  . Cancer Brother     lung    Social History   Social History  . Marital Status: Widowed    Spouse Name: N/A  . Number of Children: 1  . Years of Education: N/A   Occupational History  . Retired    Social History Main Topics  . Smoking status: Never Smoker   . Smokeless tobacco: Never Used  . Alcohol Use: No  . Drug Use: No  . Sexual Activity: No     Comment: lives alone, no dietary restrictions   Other Topics Concern  . Not on file   Social History Narrative  Outpatient Prescriptions Prior to Visit  Medication Sig Dispense Refill  . acetaminophen (TYLENOL) 500 MG tablet Take 500 mg by mouth every 6 (six) hours as needed.    Marland Kitchen albuterol (ACCUNEB) 0.63 MG/3ML nebulizer solution USE ONE VIAL IN NEBULIZER EVERY 6 HOURS AS NEEDED 150 mL 1  . allopurinol (ZYLOPRIM) 100 MG tablet TAKE TWO TABLETS BY MOUTH ONCE DAILY 60 tablet 4  . b complex vitamins tablet Take 1 tablet by mouth daily.    Marland Kitchen conjugated estrogens (PREMARIN) vaginal cream Place 1 Applicatorful vaginally daily. (Patient taking differently: Place 1 Applicatorful vaginally 3 (three) times a week. ) 42.5 g 12  . Cyanocobalamin (VITAMIN B-12 PO) Take by mouth daily.    . furosemide (LASIX) 40 MG tablet TAKE 1/2 TABLET EVERY OTHER DAY 23 tablet 6  . losartan (COZAAR) 25 MG tablet TAKE ONE TABLET BY MOUTH ONCE DAILY 30 tablet 6  . metoprolol succinate (TOPROL-XL) 25 MG 24 hr tablet TAKE ONE TABLET BY MOUTH ONCE DAILY 30 tablet 6  .  mometasone-formoterol (DULERA) 200-5 MCG/ACT AERO Inhale 2 puffs into the lungs 2 (two) times daily. 1 Inhaler 11  . Niacin (VITAMIN B-3 PO) Take 1 tablet by mouth daily.    . OXYGEN Inhale into the lungs.    . pantoprazole (PROTONIX) 40 MG tablet Take 1 tablet (40 mg total) by mouth 2 (two) times daily. 60 tablet 6  . potassium chloride SA (K-DUR,KLOR-CON) 20 MEQ tablet Take 1 tablet (20 mEq total) by mouth once. (Patient taking differently: Take 20 mEq by mouth daily. ) 90 tablet 3  . pravastatin (PRAVACHOL) 20 MG tablet Take 1 tablet (20 mg total) by mouth daily. 90 tablet 3  . Probiotic Product (PROBIOTIC PO) Take by mouth daily.    . ranitidine (ZANTAC) 300 MG tablet Take 1 tablet (300 mg total) by mouth at bedtime. 90 tablet 3  . sertraline (ZOLOFT) 50 MG tablet Take 1 tablet (50 mg total) by mouth daily. 90 tablet 3  . SYMBICORT 160-4.5 MCG/ACT inhaler INHALE 2 PUFFS INTO THE LUNGS 2 TIMES DAILY 3 Inhaler 1  . tiotropium (SPIRIVA) 18 MCG inhalation capsule Place 1 capsule (18 mcg total) into inhaler and inhale daily. 30 capsule 0  . traMADol (ULTRAM) 50 MG tablet TAKE ONE TABLET BY MOUTH THREE TIMES DAILY AS NEEDED 90 tablet 0  . fluticasone (FLONASE) 50 MCG/ACT nasal spray Place 2 sprays into both nostrils daily. (Patient not taking: Reported on 01/01/2015) 16 g 6  . glucose blood (ONE TOUCH TEST STRIPS) test strip Use as instructed to check blood sugar once weekly. DX 790.29     . albuterol (PROAIR HFA) 108 (90 BASE) MCG/ACT inhaler Inhale 2 puffs into the lungs every 4 (four) hours as needed. (Patient not taking: Reported on 01/01/2015) 3 Inhaler 3   No facility-administered medications prior to visit.    Allergies  Allergen Reactions  . Ace Inhibitors   . Indomethacin   . Montelukast Sodium     REACTION: Heart palpitations, chest pain  . Oseltamivir Phosphate     REACTION: nausea, vomiting, diarrhea, dizziness  . Sulfa Antibiotics Other (See Comments)    CP  . Sulfonamide  Derivatives     Review of Systems  Constitutional: Negative for fever and malaise/fatigue.  HENT: Negative for congestion.   Eyes: Negative for discharge.  Respiratory: Negative for shortness of breath.   Cardiovascular: Negative for chest pain, palpitations and leg swelling.  Gastrointestinal: Negative for nausea and abdominal pain.  Genitourinary:  Negative for dysuria.  Musculoskeletal: Negative for falls.  Skin: Negative for rash.  Neurological: Negative for loss of consciousness and headaches.  Endo/Heme/Allergies: Negative for environmental allergies.  Psychiatric/Behavioral: Negative for depression. The patient is not nervous/anxious.        Objective:    Physical Exam  Constitutional: She is oriented to person, place, and time. She appears well-developed and well-nourished. No distress.  HENT:  Head: Normocephalic and atraumatic.  Nose: Nose normal.  Eyes: Right eye exhibits no discharge. Left eye exhibits no discharge.  Neck: Normal range of motion. Neck supple.  Cardiovascular: Normal rate and regular rhythm.   Pulmonary/Chest: Effort normal and breath sounds normal.  Abdominal: Soft. Bowel sounds are normal. There is no tenderness.  Musculoskeletal: She exhibits no edema.  Neurological: She is alert and oriented to person, place, and time.  Skin: Skin is warm and dry.  Psychiatric: She has a normal mood and affect.  Nursing note and vitals reviewed.   BP 134/74 mmHg  Pulse 85  Temp(Src) 98.5 F (36.9 C) (Oral)  Ht 5\' 5"  (1.651 m)  Wt 221 lb 6 oz (100.415 kg)  BMI 36.84 kg/m2  SpO2 93% Wt Readings from Last 3 Encounters:  01/01/15 221 lb 6 oz (100.415 kg)  11/28/14 219 lb 2 oz (99.394 kg)  11/21/14 220 lb (99.791 kg)     Lab Results  Component Value Date   WBC 8.2 01/01/2015   HGB 10.8* 01/01/2015   HCT 33.3* 01/01/2015   PLT 148.0* 01/01/2015   GLUCOSE 83 01/01/2015   CHOL 153 01/01/2015   TRIG 95.0 01/01/2015   HDL 54.00 01/01/2015   LDLDIRECT  142.7 12/16/2005   LDLCALC 80 01/01/2015   ALT 12 01/01/2015   AST 18 01/01/2015   NA 142 01/01/2015   K 4.6 01/01/2015   CL 106 01/01/2015   CREATININE 1.23* 01/01/2015   BUN 19 01/01/2015   CO2 29 01/01/2015   TSH 2.12 01/01/2015   INR 1.0 09/12/2006   HGBA1C 5.9 10/02/2014   MICROALBUR 0.8 02/28/2014    Lab Results  Component Value Date   TSH 2.12 01/01/2015   Lab Results  Component Value Date   WBC 8.2 01/01/2015   HGB 10.8* 01/01/2015   HCT 33.3* 01/01/2015   MCV 94.5 01/01/2015   PLT 148.0* 01/01/2015   Lab Results  Component Value Date   NA 142 01/01/2015   K 4.6 01/01/2015   CO2 29 01/01/2015   GLUCOSE 83 01/01/2015   BUN 19 01/01/2015   CREATININE 1.23* 01/01/2015   BILITOT 0.5 01/01/2015   ALKPHOS 42 01/01/2015   AST 18 01/01/2015   ALT 12 01/01/2015   PROT 6.9 01/01/2015   ALBUMIN 4.1 01/01/2015   CALCIUM 10.0 01/01/2015   ANIONGAP 10 01/12/2014   GFR 45.40* 01/01/2015   Lab Results  Component Value Date   CHOL 153 01/01/2015   Lab Results  Component Value Date   HDL 54.00 01/01/2015   Lab Results  Component Value Date   LDLCALC 80 01/01/2015   Lab Results  Component Value Date   TRIG 95.0 01/01/2015   Lab Results  Component Value Date   CHOLHDL 3 01/01/2015   Lab Results  Component Value Date   HGBA1C 5.9 10/02/2014       Assessment & Plan:   Problem List Items Addressed This Visit    Anemia    Increase leafy greens, consider increased lean red meat and using cast iron cookware. Continue to monitor, report  any concerns      Relevant Orders   TSH (Completed)   CBC (Completed)   Lipid panel (Completed)   Comprehensive metabolic panel (Completed)   Uric acid (Completed)   PTH, intact (no Ca) (Completed)   Vitamin D (25 hydroxy) (Completed)   Diabetes type 2, controlled ([facility])    hgba1c acceptable, minimize simple carbs. Increase exercise as tolerated. Continue current meds      Relevant Orders   TSH (Completed)   CBC  (Completed)   Lipid panel (Completed)   Comprehensive metabolic panel (Completed)   Uric acid (Completed)   PTH, intact (no Ca) (Completed)   Vitamin D (25 hydroxy) (Completed)   Essential hypertension - Primary    Well controlled, no changes to meds. Encouraged heart healthy diet such as the DASH diet and exercise as tolerated.       Relevant Orders   TSH (Completed)   CBC (Completed)   Lipid panel (Completed)   Comprehensive metabolic panel (Completed)   Uric acid (Completed)   PTH, intact (no Ca) (Completed)   Vitamin D (25 hydroxy) (Completed)   GERD    Avoid offending foods, start probiotics. Do not eat large meals in late evening and consider raising head of bed.       Gout   Relevant Orders   TSH (Completed)   CBC (Completed)   Lipid panel (Completed)   Comprehensive metabolic panel (Completed)   Uric acid (Completed)   PTH, intact (no Ca) (Completed)   Vitamin D (25 hydroxy) (Completed)   Hypercalcemia   Relevant Orders   TSH (Completed)   CBC (Completed)   Lipid panel (Completed)   Comprehensive metabolic panel (Completed)   Uric acid (Completed)   PTH, intact (no Ca) (Completed)   Vitamin D (25 hydroxy) (Completed)   Hyperlipidemia, mixed    Encouraged heart healthy diet, increase exercise, avoid trans fats, consider a krill oil cap daily      Relevant Orders   TSH (Completed)   CBC (Completed)   Lipid panel (Completed)   Comprehensive metabolic panel (Completed)   Uric acid (Completed)   PTH, intact (no Ca) (Completed)   Vitamin D (25 hydroxy) (Completed)   Hypoxia   Relevant Orders   TSH (Completed)   CBC (Completed)   Lipid panel (Completed)   Comprehensive metabolic panel (Completed)   Uric acid (Completed)   PTH, intact (no Ca) (Completed)   Vitamin D (25 hydroxy) (Completed)   Tachycardia    RRR today         I am having Ms. Penaloza maintain her glucose blood, acetaminophen, Niacin (VITAMIN B-3 PO), Cyanocobalamin (VITAMIN B-12 PO),  conjugated estrogens, albuterol, pravastatin, potassium chloride SA, ranitidine, OXYGEN, fluticasone, furosemide, b complex vitamins, Probiotic Product (PROBIOTIC PO), pantoprazole, mometasone-formoterol, SYMBICORT, sertraline, allopurinol, losartan, metoprolol succinate, traMADol, tiotropium, and albuterol.  Meds ordered this encounter  Medications  . albuterol (PROAIR HFA) 108 (90 BASE) MCG/ACT inhaler    Sig: Inhale 2 puffs into the lungs every 4 (four) hours as needed.    Dispense:  3 Inhaler    Refill:  3     Penni Homans, MD

## 2015-01-06 NOTE — Assessment & Plan Note (Signed)
hgba1c acceptable, minimize simple carbs. Increase exercise as tolerated. Continue current meds 

## 2015-01-06 NOTE — Assessment & Plan Note (Signed)
Encouraged heart healthy diet, increase exercise, avoid trans fats, consider a krill oil cap daily 

## 2015-01-06 NOTE — Assessment & Plan Note (Signed)
RRR today 

## 2015-01-06 NOTE — Assessment & Plan Note (Signed)
Avoid offending foods, start probiotics. Do not eat large meals in late evening and consider raising head of bed.  

## 2015-01-06 NOTE — Assessment & Plan Note (Signed)
Well controlled, no changes to meds. Encouraged heart healthy diet such as the DASH diet and exercise as tolerated.  °

## 2015-01-06 NOTE — Assessment & Plan Note (Signed)
Increase leafy greens, consider increased lean red meat and using cast iron cookware. Continue to monitor, report any concerns 

## 2015-01-08 ENCOUNTER — Ambulatory Visit (INDEPENDENT_AMBULATORY_CARE_PROVIDER_SITE_OTHER): Payer: Commercial Managed Care - HMO | Admitting: Physical Therapy

## 2015-01-08 VITALS — HR 102

## 2015-01-08 DIAGNOSIS — R29898 Other symptoms and signs involving the musculoskeletal system: Secondary | ICD-10-CM

## 2015-01-08 DIAGNOSIS — R2681 Unsteadiness on feet: Secondary | ICD-10-CM | POA: Diagnosis not present

## 2015-01-08 DIAGNOSIS — R2689 Other abnormalities of gait and mobility: Secondary | ICD-10-CM

## 2015-01-08 DIAGNOSIS — R293 Abnormal posture: Secondary | ICD-10-CM

## 2015-01-08 NOTE — Therapy (Signed)
[place], [place], [address] Phone: [phone]   Fax:  [phone]  Physical Therapy Treatment  Patient Details  Name: [name] MRN: [id number] Date of Birth: 12/25/1940 Referring Provider: Dr. Willette Alma  Encounter Date: 01/08/2015      PT End of Session - 01/08/15 1027    Visit Number 6   Number of Visits 12   Date for PT Re-Evaluation 01/24/15   PT Start Time 1033  pt arrived late to session   PT Stop Time 1102   PT Time Calculation (min) 29 min      Past Medical History  Diagnosis Date  . Asthma     PFT 02/06/09 FEV1 1.41 (65%), FVC 1.92 (64), FEV1% 74, TLC 3.47 (71%), DLCO 48%, +BD  . ACE-inhibitor cough   . DVT (deep venous thrombosis) ([place]) 1987  . GERD (gastroesophageal reflux disease)   . Arthritis   . Atypical chest pain     s/p cath, Normal coronaries, Non ST elevation myocardial infarction, Rt groin pseudoaneurysm  . Hyperlipidemia   . Hypertension   . Depression   . Gout   . Abnormal glucose tolerance test   . Sun-damaged skin 10/24/2012  . Urinary frequency 10/24/2012  . Unspecified constipation 06/05/2013  . UTI (lower urinary tract infection) 10/24/2012  . Pneumonia   . COPD (chronic obstructive pulmonary disease) ([place])   . Anemia 03/12/2014  . Bacterial vaginosis 03/12/2014  . Hypercalcemia 10/15/2014  . Hypoxia 10/15/2014  . Fall at home 12/04/2014  . Vitamin D deficiency 01/01/2015  . Osteopenia 12/29/2006    Qualifier: Diagnosis of  By: Wynona Luna     Past Surgical History  Procedure Laterality Date  . Appendectomy  1951  . Tubal ligation  1968  . Tonsillectomy  1950    Filed Vitals:   01/08/15 1035  Pulse: 102  SpO2: 86%    Visit Diagnosis:  Abnormal posture  Weakness of both lower extremities  Functional gait abnormality  Unsteadiness      Subjective Assessment - 01/08/15 1036    Subjective Pt reports she is a little more sore  in hips today due to riding in different car for Christmas shopping.  "Cold weather has affected my breathing".  Pt feels her balance is improving.    Currently in Pain? Yes   Pain Score 8   no medication taken prior to treatment.    Pain Location Knee   Pain Orientation Right   Pain Descriptors / Indicators Sharp   Aggravating Factors  first getting up    Pain Relieving Factors rubbing knee, stretching it out.             [facility] PT Assessment - 01/08/15 0001    Assessment   Medical Diagnosis Recurrent falls    Onset Date/Surgical Date 11/21/14   Hand Dominance Right         [facility] Adult PT Treatment/Exercise - 01/08/15 0001    Knee/Hip Exercises: Stretches   Passive Hamstring Stretch Right;Left;2 reps;20 seconds  seated, back straight   Gastroc Stretch Right;Left;3 reps  15 sec    Knee/Hip Exercises: Aerobic   Nustep L2: 5 min   Knee/Hip Exercises: Standing   Hip Abduction Right;Left;2 sets;5 reps   Hip Extension Right;Left;2 sets;5 reps   Other Standing Knee Exercises Toe taps to cup on 3" step x 5 reps each side.    Other Standing Knee Exercises Tandem stance (heel toe) with occasional  UE support x 20 sec                 PT Education - 01/08/15 1209    Education provided Yes   Education Details HEP - added standing LE exercises (hip flex/ext/abd)   Person(s) Educated Patient   Methods Handout;Explanation   Comprehension Verbalized understanding;Returned demonstration             PT Long Term Goals - 12/28/14 1243    PT LONG TERM GOAL #1   Title Improve posture and alignment with patient to demonstrate more upright posture with standing and walking 01/24/15   Time 6   Period Weeks   Status On-going   PT LONG TERM GOAL #2   Title Improve ROM in Rt knee extension and bilat hip extension by 3-5 degrees 01/24/15   Time 6   Period Weeks   Status On-going   PT LONG TERM GOAL #3   Title Patient reports being able to rise from lower seated position with  minimal to no difficulty 01/24/15   Time 6   Period Weeks   Status On-going   PT LONG TERM GOAL #4   Title Increase strength bilat LE's to 4+/5 to 5/5 01/24/15   Time 6   Period Weeks   Status On-going   PT LONG TERM GOAL #5   Title Improve Berg balance scale by 8 points 01/24/15   Time 6   Period Weeks   Status On-going               Plan - 01/08/15 1210    Clinical Impression Statement Pt demonstrated improved balance with toe taps to 6" step (without UE support).  Pt able to tolerate standing LE exercises, sets of 5 reps.  Making good progress towards established goals.    Pt will benefit from skilled therapeutic intervention in order to improve on the following deficits Postural dysfunction;Improper body mechanics;Abnormal gait;Decreased range of motion;Decreased strength;Decreased endurance;Decreased activity tolerance;Decreased balance;Pain   Rehab Potential Good   PT Frequency 2x / week   PT Duration 6 weeks   PT Treatment/Interventions Patient/family education;ADLs/Self Care Home Management;Therapeutic activities;Therapeutic exercise;Manual techniques;Neuromuscular re-education;Balance training;Gait training;Stair training;Cryotherapy;Moist Heat;Electrical Stimulation;Ultrasound;Dry needling   PT Next Visit Plan Progress with balance and LE strengthening.  Measure knee/ hip ROM.    Consulted and Agree with Plan of Care Patient        Problem List Patient Active Problem List   Diagnosis Date Noted  . Fall at home 12/04/2014  . Hypercalcemia 10/15/2014  . Hypoxia 10/15/2014  . Anemia 03/12/2014  . Cervical cancer screening 02/28/2014  . Eustachian tube dysfunction 01/29/2014  . Medicare annual wellness visit, subsequent 12/04/2013  . Constipation 06/05/2013  . Tachycardia 02/23/2013  . Sun-damaged skin 10/24/2012  . UTI (lower urinary tract infection) 10/24/2012  . Back pain 04/07/2011  . Allergic rhinitis 05/23/2009  . Gout 01/11/2008  . VARICOSE VEINS, LOWER  EXTREMITIES 06/01/2007  . ADJ DISORDER WITH MIXED ANXIETY & DEPRESSED MOOD 03/25/2007  . Hyperlipidemia, mixed 12/29/2006  . Essential hypertension 12/29/2006  . Osteopenia 12/29/2006  . PELVIC PAIN, RIGHT 12/29/2006  . Diabetes type 2, controlled ([place]) 12/29/2006  . Asthma, moderate persistent 08/21/2006  . GERD 08/21/2006  . OSTEOARTHRITIS 08/21/2006  . DVT, HX OF 08/21/2006    Kerin Perna, PTA 01/08/2015 12:12 PM  [place], [place], [address] Phone: [phone]   Fax:  [phone]  Name: [name] MRN:  [id number] Date of Birth: 09/08/1940

## 2015-01-08 NOTE — Patient Instructions (Signed)
Hip Abduction (Standing)    Stand with support.  Lift right leg out to side, keeping toe forward. Hold for _1-2__ seconds. Relax for _1-2__ seconds.  Repeat _5__ times, 2 sets. Do _1-2__ times a day. Repeat with other leg.   HIP / KNEE: Extension - Standing    Squeeze glutes. Raise and lift leg backward. Keep knee straight or slightly bent. _5__ reps per set, _2__ sets per day, __4_ days per week Hold onto a support.  FLEXION: Standing - Stable (Active)    Stand, both feet flat. Lift right knee toward ceiling. Use _0__ lbs. Complete _2__ sets of _5__ repetitions. Perform __1_ sessions per day.  http://gtsc.exer.us/29   [facility] at [place], [place] [address]  [phone] (office) [phone] (fax)

## 2015-01-10 ENCOUNTER — Encounter: Payer: Self-pay | Admitting: Physical Therapy

## 2015-01-10 ENCOUNTER — Ambulatory Visit (INDEPENDENT_AMBULATORY_CARE_PROVIDER_SITE_OTHER): Payer: Commercial Managed Care - HMO | Admitting: Physical Therapy

## 2015-01-10 DIAGNOSIS — R2681 Unsteadiness on feet: Secondary | ICD-10-CM

## 2015-01-10 DIAGNOSIS — R29898 Other symptoms and signs involving the musculoskeletal system: Secondary | ICD-10-CM

## 2015-01-10 DIAGNOSIS — R293 Abnormal posture: Secondary | ICD-10-CM | POA: Diagnosis not present

## 2015-01-10 DIAGNOSIS — R2689 Other abnormalities of gait and mobility: Secondary | ICD-10-CM

## 2015-01-10 NOTE — Therapy (Signed)
[place], [place], [address] Phone: [phone]   Fax:  [phone]  Physical Therapy Treatment  Patient Details  Name: [name] MRN: [id number] Date of Birth: April 29, 1940 Referring Provider: Dr. Willette Alma  Encounter Date: 01/10/2015      PT End of Session - 01/10/15 1123    Visit Number 7  Pt 20 min late for appontment, wanted to stay and be treated   Number of Visits 12   Date for PT Re-Evaluation 01/24/15   PT Start Time 1120   PT Stop Time 1150   PT Time Calculation (min) 30 min      Past Medical History  Diagnosis Date  . Asthma     PFT 02/06/09 FEV1 1.41 (65%), FVC 1.92 (64), FEV1% 74, TLC 3.47 (71%), DLCO 48%, +BD  . ACE-inhibitor cough   . DVT (deep venous thrombosis) ([place]) 1987  . GERD (gastroesophageal reflux disease)   . Arthritis   . Atypical chest pain     s/p cath, Normal coronaries, Non ST elevation myocardial infarction, Rt groin pseudoaneurysm  . Hyperlipidemia   . Hypertension   . Depression   . Gout   . Abnormal glucose tolerance test   . Sun-damaged skin 10/24/2012  . Urinary frequency 10/24/2012  . Unspecified constipation 06/05/2013  . UTI (lower urinary tract infection) 10/24/2012  . Pneumonia   . COPD (chronic obstructive pulmonary disease) ([place])   . Anemia 03/12/2014  . Bacterial vaginosis 03/12/2014  . Hypercalcemia 10/15/2014  . Hypoxia 10/15/2014  . Fall at home 12/04/2014  . Vitamin D deficiency 01/01/2015  . Osteopenia 12/29/2006    Qualifier: Diagnosis of  By: Wynona Luna     Past Surgical History  Procedure Laterality Date  . Appendectomy  1951  . Tubal ligation  1968  . Tonsillectomy  1950    There were no vitals filed for this visit.  Visit Diagnosis:  Weakness of both lower extremities  Abnormal posture  Functional gait abnormality  Unsteadiness      Subjective Assessment - 01/10/15 1122    Subjective Pt reports she is  very sore in inner thigh on the Left LE after trying to get in her sisters car on the other side. Using pain meds to help it   Currently in Pain? Yes   Pain Score 8    Pain Location Knee   Pain Orientation Right   Pain Descriptors / Indicators Sharp   Pain Type Chronic pain   Multiple Pain Sites Yes                         [facility] Adult PT Treatment/Exercise - 01/10/15 0001    Exercises   Exercises Shoulder;Knee/Hip   Knee/Hip Exercises: Stretches   Passive Hamstring Stretch Both;2 reps;20 seconds  with strap   Knee/Hip Exercises: Aerobic   Nustep L4x5'   Knee/Hip Exercises: Standing   Wall Squat 4 sets;5 sets  mini slide with UE horzitional abd with red band.    Knee/Hip Exercises: Supine   Bridges Strengthening;Both;3 sets  x 7 reps UE ER, cramping in the Rt HS    Other Supine Knee/Hip Exercises 30 reps iosmetric abduction in hooklying into ball.                      PT Long Term Goals - 12/28/14 1243    PT LONG TERM GOAL #1   Title  Improve posture and alignment with patient to demonstrate more upright posture with standing and walking 01/24/15   Time 6   Period Weeks   Status On-going   PT LONG TERM GOAL #2   Title Improve ROM in Rt knee extension and bilat hip extension by 3-5 degrees 01/24/15   Time 6   Period Weeks   Status On-going   PT LONG TERM GOAL #3   Title Patient reports being able to rise from lower seated position with minimal to no difficulty 01/24/15   Time 6   Period Weeks   Status On-going   PT LONG TERM GOAL #4   Title Increase strength bilat LE's to 4+/5 to 5/5 01/24/15   Time 6   Period Weeks   Status On-going   PT LONG TERM GOAL #5   Title Improve Berg balance scale by 8 points 01/24/15   Time 6   Period Weeks   Status On-going               Plan - 01/10/15 1144    Clinical Impression Statement Pt was 20 min late for appointment so she had a limited treatment.  She developed some Rt hamstring cramping with her  exercise today.    Pt will benefit from skilled therapeutic intervention in order to improve on the following deficits Postural dysfunction;Improper body mechanics;Abnormal gait;Decreased range of motion;Decreased strength;Decreased endurance;Decreased activity tolerance;Decreased balance;Pain   Rehab Potential Good   PT Frequency 2x / week   PT Duration 6 weeks   PT Treatment/Interventions Patient/family education;ADLs/Self Care Home Management;Therapeutic activities;Therapeutic exercise;Manual techniques;Neuromuscular re-education;Balance training;Gait training;Stair training;Cryotherapy;Moist Heat;Electrical Stimulation;Ultrasound;Dry needling   PT Next Visit Plan Progress with balance and LE strengthening.  Measure knee/ hip ROM.    PT Home Exercise Plan progress strengthening    Consulted and Agree with Plan of Care Patient        Problem List Patient Active Problem List   Diagnosis Date Noted  . Fall at home 12/04/2014  . Hypercalcemia 10/15/2014  . Hypoxia 10/15/2014  . Anemia 03/12/2014  . Cervical cancer screening 02/28/2014  . Eustachian tube dysfunction 01/29/2014  . Medicare annual wellness visit, subsequent 12/04/2013  . Constipation 06/05/2013  . Tachycardia 02/23/2013  . Sun-damaged skin 10/24/2012  . UTI (lower urinary tract infection) 10/24/2012  . Back pain 04/07/2011  . Allergic rhinitis 05/23/2009  . Gout 01/11/2008  . VARICOSE VEINS, LOWER EXTREMITIES 06/01/2007  . ADJ DISORDER WITH MIXED ANXIETY & DEPRESSED MOOD 03/25/2007  . Hyperlipidemia, mixed 12/29/2006  . Essential hypertension 12/29/2006  . Osteopenia 12/29/2006  . PELVIC PAIN, RIGHT 12/29/2006  . Diabetes type 2, controlled ([place]) 12/29/2006  . Asthma, moderate persistent 08/21/2006  . GERD 08/21/2006  . OSTEOARTHRITIS 08/21/2006  . DVT, HX OF 08/21/2006    Jeral Pinch PT 01/10/2015, 5:07 PM  [facility] [place], [place], [address] Phone: [phone]   Fax:  [phone]  Name: LEILANIE CREGAN MRN: [id number] Date of Birth: May 24, 1940

## 2015-01-17 ENCOUNTER — Encounter: Payer: Commercial Managed Care - HMO | Admitting: Physical Therapy

## 2015-01-19 ENCOUNTER — Encounter: Payer: Commercial Managed Care - HMO | Admitting: Physical Therapy

## 2015-01-24 ENCOUNTER — Encounter: Payer: Commercial Managed Care - HMO | Admitting: Physical Therapy

## 2015-01-26 ENCOUNTER — Encounter: Payer: Commercial Managed Care - HMO | Admitting: Physical Therapy

## 2015-01-30 ENCOUNTER — Other Ambulatory Visit: Payer: Self-pay

## 2015-01-30 DIAGNOSIS — Z1231 Encounter for screening mammogram for malignant neoplasm of breast: Secondary | ICD-10-CM

## 2015-02-02 ENCOUNTER — Other Ambulatory Visit (INDEPENDENT_AMBULATORY_CARE_PROVIDER_SITE_OTHER): Payer: Commercial Managed Care - HMO

## 2015-02-02 DIAGNOSIS — D509 Iron deficiency anemia, unspecified: Secondary | ICD-10-CM | POA: Diagnosis not present

## 2015-02-02 LAB — CBC WITH DIFFERENTIAL/PLATELET
BASOS ABS: 0 10*3/uL (ref 0.0–0.1)
BASOS PCT: 0 % (ref 0–1)
EOS ABS: 0.4 10*3/uL (ref 0.0–0.7)
EOS PCT: 6 % — AB (ref 0–5)
HEMATOCRIT: 32.4 % — AB (ref 36.0–46.0)
Hemoglobin: 10.8 g/dL — ABNORMAL LOW (ref 12.0–15.0)
Lymphocytes Relative: 19 % (ref 12–46)
Lymphs Abs: 1.2 10*3/uL (ref 0.7–4.0)
MCH: 31 pg (ref 26.0–34.0)
MCHC: 33.3 g/dL (ref 30.0–36.0)
MCV: 93.1 fL (ref 78.0–100.0)
MONO ABS: 0.8 10*3/uL (ref 0.1–1.0)
MPV: 11 fL (ref 8.6–12.4)
Monocytes Relative: 12 % (ref 3–12)
Neutro Abs: 4 10*3/uL (ref 1.7–7.7)
Neutrophils Relative %: 63 % (ref 43–77)
PLATELETS: 139 10*3/uL — AB (ref 150–400)
RBC: 3.48 MIL/uL — AB (ref 3.87–5.11)
RDW: 14.4 % (ref 11.5–15.5)
WBC: 6.3 10*3/uL (ref 4.0–10.5)

## 2015-02-06 DIAGNOSIS — R0902 Hypoxemia: Secondary | ICD-10-CM | POA: Diagnosis not present

## 2015-02-06 DIAGNOSIS — J4520 Mild intermittent asthma, uncomplicated: Secondary | ICD-10-CM | POA: Diagnosis not present

## 2015-02-19 ENCOUNTER — Telehealth: Payer: Self-pay | Admitting: Family Medicine

## 2015-02-19 DIAGNOSIS — J45909 Unspecified asthma, uncomplicated: Secondary | ICD-10-CM

## 2015-02-19 NOTE — Telephone Encounter (Signed)
Advise  Please.

## 2015-02-19 NOTE — Telephone Encounter (Signed)
Relation to [id number] Call back number:[phone] Pharmacy: [facility] [address] ([address]), [place] [phone] (Phone) [phone] (Fax)         Reason for call:  Patient requesting a refill tiotropium (SPIRIVA) 18 MCG inhalation capsule patient states the medication suppose to state 2 a day.

## 2015-02-19 NOTE — Telephone Encounter (Signed)
The guidelines on Spiriva say only 1 cap daily. I do not prescribe it BID so I cannot justify changing hers

## 2015-02-20 ENCOUNTER — Ambulatory Visit
Admission: RE | Admit: 2015-02-20 | Discharge: 2015-02-20 | Disposition: A | Discharge status: Home / Self Care | Payer: Commercial Managed Care - HMO | Source: Ambulatory Visit

## 2015-02-20 DIAGNOSIS — Z1231 Encounter for screening mammogram for malignant neoplasm of breast: Secondary | ICD-10-CM

## 2015-02-20 MED ORDER — TIOTROPIUM BROMIDE MONOHYDRATE 18 MCG IN CAPS: 18.0000 ug | ORAL_CAPSULE | Freq: Every day | RESPIRATORY_TRACT | Status: DC

## 2015-02-20 MED ORDER — PANTOPRAZOLE SODIUM 40 MG PO TBEC: 40.0000 mg | DELAYED_RELEASE_TABLET | Freq: Two times a day (BID) | ORAL | Status: DC

## 2015-02-20 NOTE — Telephone Encounter (Signed)
The original message taken was not correct.  She did not mean spiriva, but protonix.   Patients last refill on protonix was filled to take once daily and she takes 2 daily.  I did inform her on her current list it does say to take 2 daily and that is how it was sent in today to [facility].   Patient did need a refill on spiriva as well.

## 2015-03-01 ENCOUNTER — Telehealth: Payer: Self-pay | Admitting: Family Medicine

## 2015-03-01 ENCOUNTER — Other Ambulatory Visit: Payer: Self-pay | Admitting: Family Medicine

## 2015-03-01 NOTE — Telephone Encounter (Signed)
Requesting:  Tramadol Contract   10/28/2013 UDS  None Last OV   01/01/2015 Last Refill   #90 with  0 refills on  11/21/2014  Please Advise

## 2015-03-01 NOTE — Telephone Encounter (Signed)
Patient scheduled her medicare wellness for 03/28/2015 at 1pm

## 2015-03-02 NOTE — Telephone Encounter (Signed)
Faxed hardcopy for Tramadol to [facility].

## 2015-03-09 DIAGNOSIS — R0902 Hypoxemia: Secondary | ICD-10-CM | POA: Diagnosis not present

## 2015-03-09 DIAGNOSIS — J4520 Mild intermittent asthma, uncomplicated: Secondary | ICD-10-CM | POA: Diagnosis not present

## 2015-03-28 ENCOUNTER — Ambulatory Visit: Payer: Commercial Managed Care - HMO

## 2015-04-06 DIAGNOSIS — J4520 Mild intermittent asthma, uncomplicated: Secondary | ICD-10-CM | POA: Diagnosis not present

## 2015-04-06 DIAGNOSIS — R0902 Hypoxemia: Secondary | ICD-10-CM | POA: Diagnosis not present

## 2015-04-10 ENCOUNTER — Ambulatory Visit (INDEPENDENT_AMBULATORY_CARE_PROVIDER_SITE_OTHER): Payer: Commercial Managed Care - HMO | Admitting: Family Medicine

## 2015-04-10 ENCOUNTER — Encounter: Payer: Self-pay | Admitting: Family Medicine

## 2015-04-10 VITALS — BP 130/82 | HR 100 | Temp 98.2°F | Ht 65.0 in | Wt 225.0 lb

## 2015-04-10 DIAGNOSIS — I10 Essential (primary) hypertension: Secondary | ICD-10-CM

## 2015-04-10 DIAGNOSIS — K219 Gastro-esophageal reflux disease without esophagitis: Secondary | ICD-10-CM

## 2015-04-10 DIAGNOSIS — M8580 Other specified disorders of bone density and structure, unspecified site: Secondary | ICD-10-CM

## 2015-04-10 DIAGNOSIS — R0902 Hypoxemia: Secondary | ICD-10-CM

## 2015-04-10 DIAGNOSIS — E118 Type 2 diabetes mellitus with unspecified complications: Secondary | ICD-10-CM

## 2015-04-10 DIAGNOSIS — E785 Hyperlipidemia, unspecified: Secondary | ICD-10-CM | POA: Diagnosis not present

## 2015-04-10 DIAGNOSIS — R32 Unspecified urinary incontinence: Secondary | ICD-10-CM | POA: Diagnosis not present

## 2015-04-10 DIAGNOSIS — R350 Frequency of micturition: Secondary | ICD-10-CM

## 2015-04-10 DIAGNOSIS — H3530 Unspecified macular degeneration: Secondary | ICD-10-CM | POA: Insufficient documentation

## 2015-04-10 DIAGNOSIS — E782 Mixed hyperlipidemia: Secondary | ICD-10-CM

## 2015-04-10 DIAGNOSIS — M10079 Idiopathic gout, unspecified ankle and foot: Secondary | ICD-10-CM

## 2015-04-10 DIAGNOSIS — D649 Anemia, unspecified: Secondary | ICD-10-CM

## 2015-04-10 DIAGNOSIS — M109 Gout, unspecified: Secondary | ICD-10-CM

## 2015-04-10 DIAGNOSIS — R252 Cramp and spasm: Secondary | ICD-10-CM

## 2015-04-10 HISTORY — DX: Unspecified macular degeneration: H35.30

## 2015-04-10 MED ORDER — BUDESONIDE-FORMOTEROL FUMARATE 160-4.5 MCG/ACT IN AERO: INHALATION_SPRAY | RESPIRATORY_TRACT | Status: DC

## 2015-04-10 MED ORDER — ALBUTEROL SULFATE HFA 108 (90 BASE) MCG/ACT IN AERS: 2.0000 | INHALATION_SPRAY | RESPIRATORY_TRACT | Status: DC | PRN

## 2015-04-10 MED ORDER — PRAVASTATIN SODIUM 20 MG PO TABS: 20.0000 mg | ORAL_TABLET | Freq: Every day | ORAL | Status: DC

## 2015-04-10 MED ORDER — LOSARTAN POTASSIUM 25 MG PO TABS
25.0000 mg | ORAL_TABLET | Freq: Every day | ORAL | Status: DC
Start: 2015-04-10 — End: 2015-10-18

## 2015-04-10 NOTE — Assessment & Plan Note (Signed)
Notes some urinary frequency and urgency increased recently, check a UA and culture.

## 2015-04-10 NOTE — Progress Notes (Signed)
Pre visit review using our clinic review tool, if applicable. No additional management support is needed unless otherwise documented below in the visit note. 

## 2015-04-10 NOTE — Patient Instructions (Signed)

## 2015-04-10 NOTE — Assessment & Plan Note (Signed)
Encouraged heart healthy diet, increase exercise, avoid trans fats, consider a krill oil cap daily 

## 2015-04-10 NOTE — Progress Notes (Signed)
Subjective:    Patient ID: [name], female    DOB: [birth year], 75 y.o.   MRN: [id number]  Chief Complaint  Patient presents with  . Follow-up    HPI Patient is in today for follow up with some concerns of dizziness, confusion  and headaches.  Patient also presents with some urinary urgency and urinary incontinence, report some cramps in lower right leg.  Patient also reports Dr. Shirley Muscat notified her that she has macular degeneration in eyes.  Past Medical History  Diagnosis Date  . Asthma     PFT 02/06/09 FEV1 1.41 (65%), FVC 1.92 (64), FEV1% 74, TLC 3.47 (71%), DLCO 48%, +BD  . ACE-inhibitor cough   . DVT (deep venous thrombosis) ([place]) 1987  . GERD (gastroesophageal reflux disease)   . Arthritis   . Atypical chest pain     s/p cath, Normal coronaries, Non ST elevation myocardial infarction, Rt groin pseudoaneurysm  . Hyperlipidemia   . Hypertension   . Depression   . Gout   . Abnormal glucose tolerance test   . Sun-damaged skin 10/24/2012  . Urinary frequency 10/24/2012  . Unspecified constipation 06/05/2013  . UTI (lower urinary tract infection) 10/24/2012  . Pneumonia   . COPD (chronic obstructive pulmonary disease) ([place])   . Anemia 03/12/2014  . Bacterial vaginosis 03/12/2014  . Hypercalcemia 10/15/2014  . Hypoxia 10/15/2014  . Fall at home 12/04/2014  . Vitamin D deficiency 01/01/2015  . Osteopenia 12/29/2006    Qualifier: Diagnosis of  By: Wynona Luna   . Macular degeneration 04/10/2015    Past Surgical History  Procedure Laterality Date  . Appendectomy  1951  . Tubal ligation  1968  . Tonsillectomy  1950    Family History  Problem Relation Age of Onset  . Asthma Sister   . Arthritis Sister   . Hypertension Sister   . Hyperlipidemia Sister   . Cancer Sister 51    uterine  . Uterine cancer Sister   . Coronary artery disease Brother     x2  . Cancer Brother     lung, smoker  . Arthritis Brother   . Coronary artery disease Other   .  Hypertension Sister   . Arthritis Sister   . Hyperlipidemia Sister   . Emphysema Sister   . COPD Sister     smoker  . Heart disease Sister   . Hyperlipidemia Sister   . Hypertension Sister   . Arthritis Sister   . Emphysema Brother   . Heart disease Brother     MI smoker  . Colon polyps Sister   . Mental illness Father   . Alcohol abuse Father   . Heart disease Mother     MI  . Hyperlipidemia Mother   . Epilepsy Daughter   . Hypertension Daughter   . Obesity Daughter   . COPD Brother     smoker  . Cancer Brother     lung    Social History   Social History  . Marital Status: Widowed    Spouse Name: N/A  . Number of Children: 1  . Years of Education: N/A   Occupational History  . Retired    Social History Main Topics  . Smoking status: Never Smoker   . Smokeless tobacco: Never Used  . Alcohol Use: No  . Drug Use: No  . Sexual Activity: No     Comment: lives alone, no dietary restrictions   Other Topics Concern  . Not  on file   Social History Narrative    Outpatient Prescriptions Prior to Visit  Medication Sig Dispense Refill  . acetaminophen (TYLENOL) 500 MG tablet Take 500 mg by mouth every 6 (six) hours as needed.    Marland Kitchen albuterol (ACCUNEB) 0.63 MG/3ML nebulizer solution USE ONE VIAL IN NEBULIZER EVERY 6 HOURS AS NEEDED 150 mL 1  . allopurinol (ZYLOPRIM) 100 MG tablet TAKE TWO TABLETS BY MOUTH ONCE DAILY 60 tablet 4  . b complex vitamins tablet Take 1 tablet by mouth daily.    Marland Kitchen conjugated estrogens (PREMARIN) vaginal cream Place 1 Applicatorful vaginally daily. (Patient taking differently: Place 1 Applicatorful vaginally 3 (three) times a week. ) 42.5 g 12  . Cyanocobalamin (VITAMIN B-12 PO) Take by mouth daily.    . Ferrous Fumarate-Folic Acid (HEMOCYTE-F) 324-1 MG TABS Take 1 tablet by mouth daily. 30 each 2  . fluticasone (FLONASE) 50 MCG/ACT nasal spray Place 2 sprays into both nostrils daily. 16 g 6  . furosemide (LASIX) 40 MG tablet TAKE 1/2 TABLET  EVERY OTHER DAY 23 tablet 6  . glucose blood (ONE TOUCH TEST STRIPS) test strip Use as instructed to check blood sugar once weekly. DX 790.29     . metoprolol succinate (TOPROL-XL) 25 MG 24 hr tablet TAKE ONE TABLET BY MOUTH ONCE DAILY 30 tablet 6  . mometasone-formoterol (DULERA) 200-5 MCG/ACT AERO Inhale 2 puffs into the lungs 2 (two) times daily. 1 Inhaler 11  . Niacin (VITAMIN B-3 PO) Take 1 tablet by mouth daily.    . OXYGEN Inhale into the lungs.    . pantoprazole (PROTONIX) 40 MG tablet Take 1 tablet (40 mg total) by mouth 2 (two) times daily. 180 tablet 1  . potassium chloride SA (K-DUR,KLOR-CON) 20 MEQ tablet TAKE 1 TABLET EVERY DAY 90 tablet 2  . Probiotic Product (PROBIOTIC PO) Take by mouth daily.    . ranitidine (ZANTAC) 300 MG tablet Take 1 tablet (300 mg total) by mouth at bedtime. 90 tablet 3  . sertraline (ZOLOFT) 50 MG tablet Take 1 tablet (50 mg total) by mouth daily. 90 tablet 3  . tiotropium (SPIRIVA) 18 MCG inhalation capsule Place 1 capsule (18 mcg total) into inhaler and inhale daily. 90 capsule 1  . traMADol (ULTRAM) 50 MG tablet TAKE ONE TABLET BY MOUTH THREE TIMES DAILY AS NEEDED 90 tablet 0  . albuterol (PROAIR HFA) 108 (90 BASE) MCG/ACT inhaler Inhale 2 puffs into the lungs every 4 (four) hours as needed. 3 Inhaler 3  . losartan (COZAAR) 25 MG tablet TAKE ONE TABLET BY MOUTH ONCE DAILY 30 tablet 6  . pravastatin (PRAVACHOL) 20 MG tablet Take 1 tablet (20 mg total) by mouth daily. 90 tablet 3  . SYMBICORT 160-4.5 MCG/ACT inhaler INHALE 2 PUFFS INTO THE LUNGS 2 TIMES DAILY 3 Inhaler 1   No facility-administered medications prior to visit.    Allergies  Allergen Reactions  . Ace Inhibitors   . Indomethacin   . Montelukast Sodium     REACTION: Heart palpitations, chest pain  . Oseltamivir Phosphate     REACTION: nausea, vomiting, diarrhea, dizziness  . Sulfa Antibiotics Other (See Comments)    CP  . Sulfonamide Derivatives     Review of Systems    Constitutional: Negative for fever and malaise/fatigue.  HENT: Negative for congestion.   Eyes: Negative for blurred vision.       Patient reports having macular degeneration.  Respiratory: Negative for shortness of breath.   Cardiovascular: Negative for chest  pain, palpitations and leg swelling.  Gastrointestinal: Negative for nausea, abdominal pain and blood in stool.  Genitourinary: Positive for urgency and frequency. Negative for dysuria.  Musculoskeletal: Negative for falls.  Skin: Negative for rash.  Neurological: Negative for dizziness, loss of consciousness and headaches.  Endo/Heme/Allergies: Negative for environmental allergies.  Psychiatric/Behavioral: Negative for depression. The patient is not nervous/anxious.        Objective:    Physical Exam  Constitutional: She is oriented to person, place, and time. She appears well-developed and well-nourished. No distress.  HENT:  Head: Normocephalic and atraumatic.  Eyes: Conjunctivae are normal.  Neck: Neck supple. No thyromegaly present.  Cardiovascular: Normal rate, regular rhythm and normal heart sounds.   Pulmonary/Chest: Effort normal and breath sounds normal. No respiratory distress.  Abdominal: Soft. Bowel sounds are normal. She exhibits no distension and no mass. There is no tenderness.  Musculoskeletal: She exhibits no edema.  Lymphadenopathy:    She has no cervical adenopathy.  Neurological: She is alert and oriented to person, place, and time.  Skin: Skin is warm and dry.  Psychiatric: She has a normal mood and affect. Her behavior is normal.    BP 130/82 mmHg  Pulse 100  Temp(Src) 98.2 F (36.8 C) (Oral)  Ht 5\' 5"  (1.651 m)  Wt 225 lb (102.059 kg)  BMI 37.44 kg/m2  SpO2 94% Wt Readings from Last 3 Encounters:  04/10/15 225 lb (102.059 kg)  01/01/15 221 lb 6 oz (100.415 kg)  11/28/14 219 lb 2 oz (99.394 kg)     Lab Results  Component Value Date   WBC 7.4 04/10/2015   HGB 11.0* 04/10/2015   HCT  32.8* 04/10/2015   PLT 148.0* 04/10/2015   GLUCOSE 97 04/10/2015   CHOL 153 01/01/2015   TRIG 95.0 01/01/2015   HDL 54.00 01/01/2015   LDLDIRECT 142.7 12/16/2005   LDLCALC 80 01/01/2015   ALT 10 04/10/2015   AST 14 04/10/2015   NA 140 04/10/2015   K 4.3 04/10/2015   CL 103 04/10/2015   CREATININE 1.22* 04/10/2015   BUN 28* 04/10/2015   CO2 31 04/10/2015   TSH 2.55 04/10/2015   INR 1.0 09/12/2006   HGBA1C 6.1 04/10/2015   MICROALBUR 0.8 02/28/2014    Lab Results  Component Value Date   TSH 2.55 04/10/2015   Lab Results  Component Value Date   WBC 7.4 04/10/2015   HGB 11.0* 04/10/2015   HCT 32.8* 04/10/2015   MCV 91.1 04/10/2015   PLT 148.0* 04/10/2015   Lab Results  Component Value Date   NA 140 04/10/2015   K 4.3 04/10/2015   CO2 31 04/10/2015   GLUCOSE 97 04/10/2015   BUN 28* 04/10/2015   CREATININE 1.22* 04/10/2015   BILITOT 0.4 04/10/2015   ALKPHOS 45 04/10/2015   AST 14 04/10/2015   ALT 10 04/10/2015   PROT 7.3 04/10/2015   ALBUMIN 4.2 04/10/2015   CALCIUM 10.1 04/10/2015   ANIONGAP 10 01/12/2014   GFR 45.79* 04/10/2015   Lab Results  Component Value Date   CHOL 153 01/01/2015   Lab Results  Component Value Date   HDL 54.00 01/01/2015   Lab Results  Component Value Date   LDLCALC 80 01/01/2015   Lab Results  Component Value Date   TRIG 95.0 01/01/2015   Lab Results  Component Value Date   CHOLHDL 3 01/01/2015   Lab Results  Component Value Date   HGBA1C 6.1 04/10/2015       Assessment & Plan:  Problem List Items Addressed This Visit    Anemia   Relevant Orders   CBC (Completed)   Hemoglobin A1c (Completed)   Comp Met (CMET) (Completed)   TSH (Completed)   Uric acid (Completed)   VITAMIN D 25 Hydroxy (Vit-D Deficiency, Fractures) (Completed)   Diabetes type 2, controlled (HCC)    hgba1c acceptable, minimize simple carbs. Increase exercise as tolerated. Continue current meds      Relevant Medications   losartan (COZAAR)  25 MG tablet   pravastatin (PRAVACHOL) 20 MG tablet   Other Relevant Orders   CBC (Completed)   Hemoglobin A1c (Completed)   Comp Met (CMET) (Completed)   TSH (Completed)   Uric acid (Completed)   VITAMIN D 25 Hydroxy (Vit-D Deficiency, Fractures) (Completed)   Essential hypertension    Well controlled, no changes to meds. Encouraged heart healthy diet such as the DASH diet and exercise as tolerated.       Relevant Medications   losartan (COZAAR) 25 MG tablet   pravastatin (PRAVACHOL) 20 MG tablet   Other Relevant Orders   CBC (Completed)   Hemoglobin A1c (Completed)   Comp Met (CMET) (Completed)   TSH (Completed)   Uric acid (Completed)   VITAMIN D 25 Hydroxy (Vit-D Deficiency, Fractures) (Completed)   GERD    Avoid offending foods, start probiotics. Do not eat large meals in late evening and consider raising head of bed.       Gout   Relevant Orders   CBC (Completed)   Hemoglobin A1c (Completed)   Comp Met (CMET) (Completed)   TSH (Completed)   Uric acid (Completed)   VITAMIN D 25 Hydroxy (Vit-D Deficiency, Fractures) (Completed)   Hyperlipidemia, mixed    Encouraged heart healthy diet, increase exercise, avoid trans fats, consider a krill oil cap daily      Relevant Medications   losartan (COZAAR) 25 MG tablet   pravastatin (PRAVACHOL) 20 MG tablet   Hypoxia - Primary   Relevant Orders   For home use only DME oxygen   Macular degeneration   Osteopenia   Relevant Orders   CBC (Completed)   Hemoglobin A1c (Completed)   Comp Met (CMET) (Completed)   TSH (Completed)   Uric acid (Completed)   VITAMIN D 25 Hydroxy (Vit-D Deficiency, Fractures) (Completed)    Other Visit Diagnoses    Hyperlipidemia        Relevant Medications    losartan (COZAAR) 25 MG tablet    pravastatin (PRAVACHOL) 20 MG tablet    Other Relevant Orders    CBC (Completed)    Hemoglobin A1c (Completed)    Comp Met (CMET) (Completed)    TSH (Completed)    Uric acid (Completed)    VITAMIN  D 25 Hydroxy (Vit-D Deficiency, Fractures) (Completed)    Cramps of right lower extremity        Relevant Orders    Magnesium (Completed)    Urine frequency        Relevant Orders    Urinalysis (Completed)    Urine culture (Completed)    Urinary incontinence, unspecified incontinence type        Relevant Orders    Urinalysis (Completed)    Urine culture (Completed)       I have changed Ms. Grounds's SYMBICORT to budesonide-formoterol. I have also changed her losartan. I am also having her maintain her glucose blood, acetaminophen, Niacin (VITAMIN B-3 PO), Cyanocobalamin (VITAMIN B-12 PO), conjugated estrogens, albuterol, ranitidine, OXYGEN, fluticasone, furosemide, b complex vitamins, Probiotic Product (  PROBIOTIC PO), mometasone-formoterol, sertraline, allopurinol, metoprolol succinate, Ferrous Fumarate-Folic Acid, pantoprazole, tiotropium, traMADol, potassium chloride SA, albuterol, and pravastatin.  Meds ordered this encounter  Medications  . albuterol (PROAIR HFA) 108 (90 Base) MCG/ACT inhaler    Sig: Inhale 2 puffs into the lungs every 4 (four) hours as needed.    Dispense:  3 Inhaler    Refill:  1  . budesonide-formoterol (SYMBICORT) 160-4.5 MCG/ACT inhaler    Sig: INHALE 2 PUFFS INTO THE LUNGS 2 TIMES DAILY    Dispense:  3 Inhaler    Refill:  1  . losartan (COZAAR) 25 MG tablet    Sig: Take 1 tablet (25 mg total) by mouth daily.    Dispense:  90 tablet    Refill:  1  . pravastatin (PRAVACHOL) 20 MG tablet    Sig: Take 1 tablet (20 mg total) by mouth daily.    Dispense:  90 tablet    Refill:  1     Penni Homans, MD

## 2015-04-10 NOTE — Assessment & Plan Note (Signed)
hgba1c acceptable, minimize simple carbs. Increase exercise as tolerated. Continue current meds 

## 2015-04-11 LAB — COMPREHENSIVE METABOLIC PANEL
ALT: 10 U/L (ref 0–35)
AST: 14 U/L (ref 0–37)
Albumin: 4.2 g/dL (ref 3.5–5.2)
Alkaline Phosphatase: 45 U/L (ref 39–117)
BUN: 28 mg/dL — AB (ref 6–23)
CHLORIDE: 103 meq/L (ref 96–112)
CO2: 31 meq/L (ref 19–32)
Calcium: 10.1 mg/dL (ref 8.4–10.5)
Creatinine, Ser: 1.22 mg/dL — ABNORMAL HIGH (ref 0.40–1.20)
GFR: 45.79 mL/min — AB (ref >60.00)
GLUCOSE: 97 mg/dL (ref 70–99)
POTASSIUM: 4.3 meq/L (ref 3.5–5.1)
SODIUM: 140 meq/L (ref 135–145)
TOTAL PROTEIN: 7.3 g/dL (ref 6.0–8.3)
Total Bilirubin: 0.4 mg/dL (ref 0.2–1.2)

## 2015-04-11 LAB — URINALYSIS
BILIRUBIN URINE: NEGATIVE
Hgb urine dipstick: NEGATIVE
Ketones, ur: NEGATIVE
Leukocytes, UA: NEGATIVE
NITRITE: NEGATIVE
PH: 5.5 (ref 5.0–8.0)
Specific Gravity, Urine: 1.01 (ref 1.000–1.030)
TOTAL PROTEIN, URINE-UPE24: NEGATIVE
URINE GLUCOSE: NEGATIVE
Urobilinogen, UA: 0.2 (ref 0.0–1.0)

## 2015-04-11 LAB — URINE CULTURE
Colony Count: NO GROWTH
ORGANISM ID, BACTERIA: NO GROWTH

## 2015-04-11 LAB — HEMOGLOBIN A1C: HEMOGLOBIN A1C: 6.1 % (ref 4.6–6.5)

## 2015-04-11 LAB — CBC
HCT: 32.8 % — ABNORMAL LOW (ref 36.0–46.0)
Hemoglobin: 11 g/dL — ABNORMAL LOW (ref 12.0–15.0)
MCHC: 33.5 g/dL (ref 30.0–36.0)
MCV: 91.1 fl (ref 78.0–100.0)
Platelets: 148 10*3/uL — ABNORMAL LOW (ref 150.0–400.0)
RBC: 3.6 Mil/uL — AB (ref 3.87–5.11)
RDW: 14.6 % (ref 11.5–15.5)
WBC: 7.4 10*3/uL (ref 4.0–10.5)

## 2015-04-11 LAB — MAGNESIUM: Magnesium: 1.8 mg/dL (ref 1.5–2.5)

## 2015-04-11 LAB — TSH: TSH: 2.55 u[IU]/mL (ref 0.35–4.50)

## 2015-04-11 LAB — VITAMIN D 25 HYDROXY (VIT D DEFICIENCY, FRACTURES): VITD: 35.64 ng/mL (ref 30.00–100.00)

## 2015-04-11 LAB — URIC ACID: Uric Acid, Serum: 4.3 mg/dL (ref 2.4–7.0)

## 2015-04-12 ENCOUNTER — Telehealth: Payer: Self-pay | Admitting: Family Medicine

## 2015-04-12 NOTE — Assessment & Plan Note (Signed)
Avoid offending foods, start probiotics. Do not eat large meals in late evening and consider raising head of bed.  

## 2015-04-12 NOTE — Assessment & Plan Note (Signed)
Well controlled, no changes to meds. Encouraged heart healthy diet such as the DASH diet and exercise as tolerated.  °

## 2015-04-20 ENCOUNTER — Telehealth: Payer: Self-pay | Admitting: Family Medicine

## 2015-04-20 NOTE — Telephone Encounter (Signed)
error 

## 2015-04-20 NOTE — Telephone Encounter (Signed)
CB: [phone]

## 2015-04-20 NOTE — Telephone Encounter (Signed)
Called the patient and she had called the pharmacy and they now have her medication.

## 2015-04-20 NOTE — Telephone Encounter (Signed)
Pt says that she spoke with pharmacy and was told that medication isn't at pharmacy. Pt says that she is now out of her medication. Informed pt of medication status. Pt would like to know if Rx could be resent again.     CB:

## 2015-04-21 ENCOUNTER — Other Ambulatory Visit: Payer: Self-pay | Admitting: Family Medicine

## 2015-04-30 ENCOUNTER — Other Ambulatory Visit: Payer: Self-pay | Admitting: Family Medicine

## 2015-04-30 ENCOUNTER — Other Ambulatory Visit: Payer: Self-pay

## 2015-04-30 NOTE — Telephone Encounter (Signed)
Refilled patients tramadol with #90 with 0 refill

## 2015-04-30 NOTE — Telephone Encounter (Signed)
OK to refill her Tramadol with same strength, same sig, same number

## 2015-05-07 ENCOUNTER — Other Ambulatory Visit: Payer: Self-pay | Admitting: Family Medicine

## 2015-05-07 DIAGNOSIS — R0902 Hypoxemia: Secondary | ICD-10-CM | POA: Diagnosis not present

## 2015-05-07 DIAGNOSIS — J4520 Mild intermittent asthma, uncomplicated: Secondary | ICD-10-CM | POA: Diagnosis not present

## 2015-05-10 ENCOUNTER — Telehealth: Payer: Self-pay | Admitting: Family Medicine

## 2015-05-10 DIAGNOSIS — R0902 Hypoxemia: Secondary | ICD-10-CM

## 2015-05-10 NOTE — Telephone Encounter (Signed)
Pt called in because she says that she need more oxygen. Pt says that her PCP mentioned it but she hasn't heard anything else about it. Pt is requesting a call back to discuss further.    CB: [phone]

## 2015-05-10 NOTE — Telephone Encounter (Signed)
Spoke to [employer] at [employer] supply. He will Need #1  An order for a portable concentrator #2  Can add an addendum to OV notes from 04/10/2015 stating discussed  Can fax both tomorrow 05/11/2015 Gillermina Phy at [phone] and he will do what he can to try and get approved for this patient.

## 2015-05-10 NOTE — Assessment & Plan Note (Signed)
Patient mobile and in need of portable oxygen concentrator to help her with ambulation and activities of ADLs. Order is written

## 2015-05-11 ENCOUNTER — Other Ambulatory Visit: Payer: Self-pay | Admitting: Family Medicine

## 2015-05-11 NOTE — Telephone Encounter (Signed)
Printed order/office notes and will fax to number below once PCP signs order.

## 2015-05-11 NOTE — Telephone Encounter (Signed)
Had to reprint script for concentrator as needed stated on [facility].  refaxed

## 2015-05-11 NOTE — Addendum Note (Signed)
Addended by: Sharon Seller B on: 05/11/2015 11:57 AM   Modules accepted: Orders

## 2015-05-11 NOTE — Telephone Encounter (Signed)
Fax has been sent and done.  Patient informed

## 2015-05-14 NOTE — Telephone Encounter (Signed)
Faxed hardcopy to Tramadol to [facility]

## 2015-06-06 DIAGNOSIS — R0902 Hypoxemia: Secondary | ICD-10-CM | POA: Diagnosis not present

## 2015-06-06 DIAGNOSIS — J4520 Mild intermittent asthma, uncomplicated: Secondary | ICD-10-CM | POA: Diagnosis not present

## 2015-06-21 DIAGNOSIS — R0902 Hypoxemia: Secondary | ICD-10-CM | POA: Diagnosis not present

## 2015-06-21 DIAGNOSIS — J4520 Mild intermittent asthma, uncomplicated: Secondary | ICD-10-CM | POA: Diagnosis not present

## 2015-06-27 ENCOUNTER — Ambulatory Visit: Payer: Commercial Managed Care - HMO | Admitting: Internal Medicine

## 2015-06-28 ENCOUNTER — Telehealth: Payer: Self-pay | Admitting: Family Medicine

## 2015-06-28 ENCOUNTER — Encounter: Payer: Self-pay | Admitting: Adult Health

## 2015-06-28 ENCOUNTER — Ambulatory Visit (INDEPENDENT_AMBULATORY_CARE_PROVIDER_SITE_OTHER): Payer: Commercial Managed Care - HMO | Admitting: Adult Health

## 2015-06-28 VITALS — BP 117/68 | HR 83

## 2015-06-28 DIAGNOSIS — J9611 Chronic respiratory failure with hypoxia: Secondary | ICD-10-CM | POA: Insufficient documentation

## 2015-06-28 DIAGNOSIS — J4540 Moderate persistent asthma, uncomplicated: Secondary | ICD-10-CM

## 2015-06-28 DIAGNOSIS — R0902 Hypoxemia: Secondary | ICD-10-CM

## 2015-06-28 DIAGNOSIS — J9612 Chronic respiratory failure with hypercapnia: Secondary | ICD-10-CM

## 2015-06-28 NOTE — Telephone Encounter (Signed)
Caller name: Randall Hiss from [place]  Relation to pt: Call back number: [phone] and fax # [phone]    Reason for call:  [employer] called checking on the status of CMN form faxed over 06/22/15. Requested re fax form to [phone] Attention Jessica B.

## 2015-06-28 NOTE — Progress Notes (Signed)
Subjective:    Patient ID: [name], female    DOB: 1940/03/09, 75 y.o.   MRN: [id number]  HPI 75 yo female never smoker with chronic asthma and bronchitis , O2 dependent with act and At bedtime   Former Dr. Joya Gaskins  Patient   TEST  2011-FEV1 65%, ratio 74, DLCO 48%, +BD , FVC 64%.   06/28/2015 Follow up : Asthma   Pt returns for 1 year follow up .  Pt has hx of chronic asthma controlled on Symbicort and Spiriva  Says she has not flare of cough or wheezing  Does get winded easily.  Has been on oxygen at 2l/m for last year . Recently changed to POC .  She previously used O2 At bedtime  But they took her Concentrator when she got the POC.  She wants to see about getting O2 back at bedtime. Discussed she will need ONO to determine bedtime  O2 needs.  Uses SABA on occasion.    Past Medical History  Diagnosis Date  . Asthma     PFT 02/06/09 FEV1 1.41 (65%), FVC 1.92 (64), FEV1% 74, TLC 3.47 (71%), DLCO 48%, +BD  . ACE-inhibitor cough   . DVT (deep venous thrombosis) ([place]) 1987  . GERD (gastroesophageal reflux disease)   . Arthritis   . Atypical chest pain     s/p cath, Normal coronaries, Non ST elevation myocardial infarction, Rt groin pseudoaneurysm  . Hyperlipidemia   . Hypertension   . Depression   . Gout   . Abnormal glucose tolerance test   . Sun-damaged skin 10/24/2012  . Urinary frequency 10/24/2012  . Unspecified constipation 06/05/2013  . UTI (lower urinary tract infection) 10/24/2012  . Pneumonia   . COPD (chronic obstructive pulmonary disease) ([place])   . Anemia 03/12/2014  . Bacterial vaginosis 03/12/2014  . Hypercalcemia 10/15/2014  . Hypoxia 10/15/2014  . Fall at home 12/04/2014  . Vitamin D deficiency 01/01/2015  . Osteopenia 12/29/2006    Qualifier: Diagnosis of  By: Wynona Luna   . Macular degeneration 04/10/2015   Current Outpatient Prescriptions on File Prior to Visit  Medication Sig Dispense Refill  . acetaminophen (TYLENOL) 500 MG tablet Take 500  mg by mouth every 6 (six) hours as needed.    Marland Kitchen albuterol (ACCUNEB) 0.63 MG/3ML nebulizer solution USE ONE VIAL IN NEBULIZER EVERY 6 HOURS AS NEEDED 150 mL 1  . albuterol (PROAIR HFA) 108 (90 Base) MCG/ACT inhaler Inhale 2 puffs into the lungs every 4 (four) hours as needed. 3 Inhaler 1  . allopurinol (ZYLOPRIM) 100 MG tablet TAKE TWO TABLETS BY MOUTH ONCE DAILY 60 tablet 6  . b complex vitamins tablet Take 1 tablet by mouth daily.    . budesonide-formoterol (SYMBICORT) 160-4.5 MCG/ACT inhaler INHALE 2 PUFFS INTO THE LUNGS 2 TIMES DAILY 3 Inhaler 1  . conjugated estrogens (PREMARIN) vaginal cream Place 1 Applicatorful vaginally daily. (Patient taking differently: Place 1 Applicatorful vaginally 3 (three) times a week. ) 42.5 g 12  . Cyanocobalamin (VITAMIN B-12 PO) Take by mouth daily.    . Ferrous Fumarate-Folic Acid (HEMOCYTE-F) 324-1 MG TABS Take 1 tablet by mouth daily. 30 each 2  . fluticasone (FLONASE) 50 MCG/ACT nasal spray Place 2 sprays into both nostrils daily. 16 g 6  . furosemide (LASIX) 40 MG tablet TAKE 1/2 TABLET EVERY OTHER DAY 23 tablet 6  . glucose blood (ONE TOUCH TEST STRIPS) test strip Use as instructed to check blood sugar once weekly. DX 790.29     .  losartan (COZAAR) 25 MG tablet Take 1 tablet (25 mg total) by mouth daily. 90 tablet 1  . metoprolol succinate (TOPROL-XL) 25 MG 24 hr tablet TAKE ONE TABLET BY MOUTH ONCE DAILY 30 tablet 6  . mometasone-formoterol (DULERA) 200-5 MCG/ACT AERO Inhale 2 puffs into the lungs 2 (two) times daily. 1 Inhaler 11  . Niacin (VITAMIN B-3 PO) Take 1 tablet by mouth daily.    . OXYGEN Inhale into the lungs.    . pantoprazole (PROTONIX) 40 MG tablet Take 1 tablet (40 mg total) by mouth 2 (two) times daily. 180 tablet 1  . potassium chloride SA (K-DUR,KLOR-CON) 20 MEQ tablet TAKE 1 TABLET EVERY DAY 90 tablet 2  . pravastatin (PRAVACHOL) 20 MG tablet Take 1 tablet (20 mg total) by mouth daily. 90 tablet 1  . Probiotic Product (PROBIOTIC PO)  Take by mouth daily.    . ranitidine (ZANTAC) 300 MG tablet TAKE 1 TABLET AT BEDTIME 90 tablet 3  . sertraline (ZOLOFT) 50 MG tablet Take 1 tablet (50 mg total) by mouth daily. 90 tablet 3  . tiotropium (SPIRIVA) 18 MCG inhalation capsule Place 1 capsule (18 mcg total) into inhaler and inhale daily. 90 capsule 1  . traMADol (ULTRAM) 50 MG tablet TAKE ONE TABLET BY MOUTH THREE TIMES DAILY AS NEEDED 90 tablet 0   No current facility-administered medications on file prior to visit.      Review of Systems Constitutional:   No  weight loss, night sweats,  Fevers, chills,  +tigue, or  lassitude.  HEENT:   No headaches,  Difficulty swallowing,  Tooth/dental problems, or  Sore throat,                No sneezing, itching, ear ache, +nasal congestion, post nasal drip,   CV:  No chest pain,  Orthopnea, PND, swelling in lower extremities, anasarca, dizziness, palpitations, syncope.   GI  No heartburn, indigestion, abdominal pain, nausea, vomiting, diarrhea, change in bowel habits, loss of appetite, bloody stools.   Resp:  .  No chest wall deformity  Skin: no rash or lesions.  GU: no dysuria, change in color of urine, no urgency or frequency.  No flank pain, no hematuria   MS:  No joint pain or swelling.  No decreased range of motion.  No back pain.  Psych:  No change in mood or affect. No depression or anxiety.  No memory loss.         Objective:   Physical Exam  Filed Vitals:   06/28/15 1004  BP: 117/68  Pulse: 83  SpO2: 96%   GEN: A/Ox3; pleasant , NAD, elderly , on O2   HEENT:  [place]/AT,  EACs-clear, TMs-wnl, NOSE-clear, THROAT-clear, no lesions, no postnasal drip or exudate noted.   NECK:  Supple w/ fair ROM; no JVD; normal carotid impulses w/o bruits; no thyromegaly or nodules palpated; no lymphadenopathy.  RESP  Clear  P & A; w/o, wheezes/ rales/ or rhonchi.no accessory muscle use, no dullness to percussion  CARD:  RRR, no m/r/g  , no peripheral edema, pulses intact, no  cyanosis or clubbing.  GI:   Soft & nt; nml bowel sounds; no organomegaly or masses detected.  Musco: Warm bil, no deformities or joint swelling noted.   Neuro: alert, no focal deficits noted.    Skin: Warm, no lesions or rashes  Dantavious Snowball NP-C  [facility]  06/28/2015       Assessment & Plan:

## 2015-06-28 NOTE — Assessment & Plan Note (Addendum)
Compensated on present regimen   Plan  Continue on Symbicort and Spiriva . , rinse well after use.  We are setting you up for an Overnight oximetry test on room air.  Continue with oxygen 2l/m with activity .  Follow Up with Dr. Elsworth Soho  In 1 year and As needed

## 2015-06-28 NOTE — Telephone Encounter (Signed)
Form faxed to [phone] successfully. Sent for scanning. JG//CMA

## 2015-06-28 NOTE — Patient Instructions (Addendum)
Continue on Symbicort and Spiriva . , rinse well after use.  We are setting you up for an Overnight oximetry test on room air.  Continue with oxygen 2l/m with activity .  Follow Up with Dr. Elsworth Soho  In 1 year and As needed

## 2015-06-28 NOTE — Telephone Encounter (Signed)
This is a Dr. Charlett Blake patient, message forwarded to Shirlean Mylar

## 2015-06-28 NOTE — Assessment & Plan Note (Addendum)
Will set up for ONO to see if O2 at bedtime is indicated  Plan  Continue on Symbicort and Spiriva . , rinse well after use.  We are setting you up for an Overnight oximetry test on room air.  Continue with oxygen 2l/m with activity .  Follow Up with Dr. Elsworth Soho  In 1 year and As needed

## 2015-06-29 NOTE — Progress Notes (Signed)
Reviewed & agree with plan  

## 2015-07-02 ENCOUNTER — Other Ambulatory Visit: Payer: Self-pay | Admitting: Family Medicine

## 2015-07-02 NOTE — Telephone Encounter (Signed)
Faxed hardcopy for tramadol to [facility]

## 2015-07-02 NOTE — Telephone Encounter (Signed)
Last refill on 05/13/2015

## 2015-07-20 ENCOUNTER — Telehealth: Payer: Self-pay | Admitting: Family Medicine

## 2015-07-20 NOTE — Telephone Encounter (Signed)
Relation to [id number] Call back [phone] Pharmacy: [place], [place] [phone] (Phone) [phone] (Fax)         Reason for call:  Patient requesting a refill of metoprolol succinate (TOPROL-XL) 25 MG 24 hr tablet and allopurinol (ZYLOPRIM) 100 MG tablet

## 2015-07-21 DIAGNOSIS — J4520 Mild intermittent asthma, uncomplicated: Secondary | ICD-10-CM | POA: Diagnosis not present

## 2015-07-21 DIAGNOSIS — R0902 Hypoxemia: Secondary | ICD-10-CM | POA: Diagnosis not present

## 2015-07-23 MED ORDER — ALLOPURINOL 100 MG PO TABS: 200.0000 mg | ORAL_TABLET | Freq: Every day | ORAL | Status: DC

## 2015-07-23 MED ORDER — METOPROLOL SUCCINATE ER 25 MG PO TB24: 25.0000 mg | ORAL_TABLET | Freq: Every day | ORAL | Status: DC

## 2015-07-27 ENCOUNTER — Telehealth: Payer: Self-pay | Admitting: Family Medicine

## 2015-07-27 DIAGNOSIS — J449 Chronic obstructive pulmonary disease, unspecified: Secondary | ICD-10-CM | POA: Diagnosis not present

## 2015-07-27 DIAGNOSIS — R0902 Hypoxemia: Secondary | ICD-10-CM | POA: Diagnosis not present

## 2015-07-27 DIAGNOSIS — J209 Acute bronchitis, unspecified: Secondary | ICD-10-CM | POA: Diagnosis not present

## 2015-07-27 NOTE — Telephone Encounter (Signed)
Pt called in to inform PCP that she is still having the same leg pain from her previous appt. Pt says that they discussed having an x-ray if not feeling better. Pt would like to be advised further.   CB: [phone]

## 2015-07-28 NOTE — Telephone Encounter (Signed)
I have not seen her for 4 months so need to know more about her current pain. I assume it is the right leg? The whole leg from the hip down or just the lower leg? Would not hurt for her to come in to discuss options but am willing to order xray once I know more.

## 2015-07-30 NOTE — Telephone Encounter (Signed)
Spoke to the patient and both legs are hurting.Just the lower leg on both legs.  The right leg has hurt for over one year, swelling, black places where she states Dr. Charlett Blake has seen and now the other leg is bothering her.  PCP did state she needs appt. Patient notified and will schedule an appointment.

## 2015-08-03 ENCOUNTER — Encounter: Payer: Self-pay | Admitting: Family Medicine

## 2015-08-03 ENCOUNTER — Ambulatory Visit (HOSPITAL_BASED_OUTPATIENT_CLINIC_OR_DEPARTMENT_OTHER)
Admission: RE | Admit: 2015-08-03 | Discharge: 2015-08-03 | Disposition: A | Discharge status: Home / Self Care | Payer: Commercial Managed Care - HMO | Source: Ambulatory Visit | Attending: Family Medicine | Admitting: Family Medicine

## 2015-08-03 ENCOUNTER — Ambulatory Visit (INDEPENDENT_AMBULATORY_CARE_PROVIDER_SITE_OTHER): Payer: Commercial Managed Care - HMO | Admitting: Family Medicine

## 2015-08-03 VITALS — BP 130/68 | HR 95 | Temp 97.7°F | Ht 65.0 in | Wt 221.5 lb

## 2015-08-03 DIAGNOSIS — M10079 Idiopathic gout, unspecified ankle and foot: Secondary | ICD-10-CM

## 2015-08-03 DIAGNOSIS — E118 Type 2 diabetes mellitus with unspecified complications: Secondary | ICD-10-CM

## 2015-08-03 DIAGNOSIS — N390 Urinary tract infection, site not specified: Secondary | ICD-10-CM

## 2015-08-03 DIAGNOSIS — E782 Mixed hyperlipidemia: Secondary | ICD-10-CM

## 2015-08-03 DIAGNOSIS — R000 Tachycardia, unspecified: Secondary | ICD-10-CM

## 2015-08-03 DIAGNOSIS — D649 Anemia, unspecified: Secondary | ICD-10-CM

## 2015-08-03 DIAGNOSIS — M79662 Pain in left lower leg: Secondary | ICD-10-CM | POA: Insufficient documentation

## 2015-08-03 DIAGNOSIS — M109 Gout, unspecified: Secondary | ICD-10-CM

## 2015-08-03 DIAGNOSIS — R600 Localized edema: Secondary | ICD-10-CM

## 2015-08-03 DIAGNOSIS — I10 Essential (primary) hypertension: Secondary | ICD-10-CM | POA: Diagnosis not present

## 2015-08-03 DIAGNOSIS — M8580 Other specified disorders of bone density and structure, unspecified site: Secondary | ICD-10-CM

## 2015-08-03 LAB — CBC WITH DIFFERENTIAL/PLATELET
BASOS PCT: 0.3 % (ref 0.0–3.0)
Basophils Absolute: 0 10*3/uL (ref 0.0–0.1)
EOS PCT: 7.4 % — AB (ref 0.0–5.0)
Eosinophils Absolute: 0.6 10*3/uL (ref 0.0–0.7)
HCT: 35.8 % — ABNORMAL LOW (ref 36.0–46.0)
HEMOGLOBIN: 11.7 g/dL — AB (ref 12.0–15.0)
LYMPHS ABS: 1 10*3/uL (ref 0.7–4.0)
Lymphocytes Relative: 13.6 % (ref 12.0–46.0)
MCHC: 32.7 g/dL (ref 30.0–36.0)
MCV: 92.3 fl (ref 78.0–100.0)
MONO ABS: 0.7 10*3/uL (ref 0.1–1.0)
MONOS PCT: 9.6 % (ref 3.0–12.0)
Neutro Abs: 5.2 10*3/uL (ref 1.4–7.7)
Neutrophils Relative %: 69.1 % (ref 43.0–77.0)
Platelets: 168 10*3/uL (ref 150.0–400.0)
RBC: 3.88 Mil/uL (ref 3.87–5.11)
RDW: 14.5 % (ref 11.5–15.5)
WBC: 7.5 10*3/uL (ref 4.0–10.5)

## 2015-08-03 LAB — VITAMIN D 25 HYDROXY (VIT D DEFICIENCY, FRACTURES): VITD: 41.06 ng/mL (ref 30.00–100.00)

## 2015-08-03 LAB — COMPREHENSIVE METABOLIC PANEL
ALT: 11 U/L (ref 0–35)
AST: 17 U/L (ref 0–37)
Albumin: 4.3 g/dL (ref 3.5–5.2)
Alkaline Phosphatase: 54 U/L (ref 39–117)
BUN: 21 mg/dL (ref 6–23)
CO2: 28 meq/L (ref 19–32)
CREATININE: 1.45 mg/dL — AB (ref 0.40–1.20)
Calcium: 10.1 mg/dL (ref 8.4–10.5)
Chloride: 105 mEq/L (ref 96–112)
GFR: 37.48 mL/min — ABNORMAL LOW (ref >60.00)
Glucose, Bld: 96 mg/dL (ref 70–99)
POTASSIUM: 4.7 meq/L (ref 3.5–5.1)
SODIUM: 140 meq/L (ref 135–145)
Total Bilirubin: 0.5 mg/dL (ref 0.2–1.2)
Total Protein: 7.4 g/dL (ref 6.0–8.3)

## 2015-08-03 LAB — HEMOGLOBIN A1C: HEMOGLOBIN A1C: 5.7 % (ref 4.6–6.5)

## 2015-08-03 LAB — LIPID PANEL
CHOLESTEROL: 148 mg/dL (ref 0–200)
HDL: 43.3 mg/dL (ref >39.00)
LDL CALC: 76 mg/dL (ref 0–99)
NONHDL: 105.16
Total CHOL/HDL Ratio: 3
Triglycerides: 147 mg/dL (ref 0.0–149.0)
VLDL: 29.4 mg/dL (ref 0.0–40.0)

## 2015-08-03 LAB — URIC ACID: Uric Acid, Serum: 4.8 mg/dL (ref 2.4–7.0)

## 2015-08-03 LAB — TSH: TSH: 2.74 u[IU]/mL (ref 0.35–4.50)

## 2015-08-03 MED ORDER — SERTRALINE HCL 50 MG PO TABS: 50.0000 mg | ORAL_TABLET | Freq: Every day | ORAL | Status: DC

## 2015-08-03 MED ORDER — CEPHALEXIN 500 MG PO CAPS: 500.0000 mg | ORAL_CAPSULE | Freq: Three times a day (TID) | ORAL | Status: DC

## 2015-08-03 MED ORDER — ASPIRIN EC 81 MG PO TBEC: 81.0000 mg | DELAYED_RELEASE_TABLET | Freq: Every day | ORAL | Status: DC

## 2015-08-03 NOTE — Progress Notes (Signed)
Pre visit review using our clinic review tool, if applicable. No additional management support is needed unless otherwise documented below in the visit note. 

## 2015-08-03 NOTE — Patient Instructions (Signed)
1 lasix tab daily x 3 days then 1/2 tab as previous. 1 extra potassium tab over weekend and elevate feet above heart for at least 10 minutes twice daily   Peripheral Edema You have swelling in your legs (peripheral edema). This swelling is due to excess accumulation of salt and water in your body. Edema may be a sign of heart, kidney or liver disease, or a side effect of a medication. It may also be due to problems in the leg veins. Elevating your legs and using special support stockings may be very helpful, if the cause of the swelling is due to poor venous circulation. Avoid long periods of standing, whatever the cause. Treatment of edema depends on identifying the cause. Chips, pretzels, pickles and other salty foods should be avoided. Restricting salt in your diet is almost always needed. Water pills (diuretics) are often used to remove the excess salt and water from your body via urine. These medicines prevent the kidney from reabsorbing sodium. This increases urine flow. Diuretic treatment may also result in lowering of potassium levels in your body. Potassium supplements may be needed if you have to use diuretics daily. Daily weights can help you keep track of your progress in clearing your edema. You should call your caregiver for follow up care as recommended. SEEK IMMEDIATE MEDICAL CARE IF:   You have increased swelling, pain, redness, or heat in your legs.  You develop shortness of breath, especially when lying down.  You develop chest or abdominal pain, weakness, or fainting.  You have a fever.   This information is not intended to replace advice given to you by your health care provider. Make sure you discuss any questions you have with your health care provider.   Document Released: 02/14/2004 Document Revised: 03/31/2011 Document Reviewed: 07/19/2014 Elsevier Interactive Patient Education [employer].

## 2015-08-07 ENCOUNTER — Telehealth: Payer: Self-pay | Admitting: Adult Health

## 2015-08-07 NOTE — Telephone Encounter (Signed)
Received verbal order from TP after reviewing ONO on RA  Positive desats May restart oxygen 2L at bedtime  Called spoke with pt. Reviewed results and recs. Pt voiced understanding and had no further questions.

## 2015-08-13 ENCOUNTER — Encounter: Payer: Self-pay | Admitting: Adult Health

## 2015-08-20 ENCOUNTER — Telehealth: Payer: Self-pay

## 2015-08-20 DIAGNOSIS — R0600 Dyspnea, unspecified: Secondary | ICD-10-CM

## 2015-08-20 MED ORDER — ALBUTEROL SULFATE 0.63 MG/3ML IN NEBU: INHALATION_SOLUTION | RESPIRATORY_TRACT | 1 refills | Status: DC

## 2015-08-20 NOTE — Telephone Encounter (Signed)
Patient requesting albuteral be [facility] for refill can be reach at [phone]

## 2015-08-20 NOTE — Telephone Encounter (Signed)
Prescription sent in and patient informed.

## 2015-08-21 DIAGNOSIS — J4520 Mild intermittent asthma, uncomplicated: Secondary | ICD-10-CM | POA: Diagnosis not present

## 2015-08-21 DIAGNOSIS — R0902 Hypoxemia: Secondary | ICD-10-CM | POA: Diagnosis not present

## 2015-08-28 ENCOUNTER — Other Ambulatory Visit: Payer: Self-pay | Admitting: Family Medicine

## 2015-08-28 DIAGNOSIS — R600 Localized edema: Secondary | ICD-10-CM | POA: Insufficient documentation

## 2015-08-28 NOTE — Assessment & Plan Note (Signed)
hgba1c acceptable, minimize simple carbs. Increase exercise as tolerated. Continue current meds 

## 2015-08-28 NOTE — Assessment & Plan Note (Signed)
Increase leafy greens, consider increased lean red meat and using cast iron cookware. Continue to monitor, report any concerns 

## 2015-08-28 NOTE — Progress Notes (Signed)
Patient ID: [name], female   DOB: 09-26-1940, 75 y.o.   MRN: [id number]   Subjective:    Patient ID: [name], female    DOB: August 07, 1940, 75 y.o.   MRN: [id number]  Chief Complaint  Patient presents with  . Leg Pain  . Leg Swelling    HPI Patient is in today for follow up. Is complaining of worsening lower extremity edema. No injury or fall. Is noting some left calf discomfort as well but no warmth, redness or swelling. Denies recent illness or SOB. Denies CP/palp/SOB/HA/congestion/fevers/GI or GU c/o. Taking meds as prescribed  Past Medical History:  Diagnosis Date  . Abnormal glucose tolerance test   . ACE-inhibitor cough   . Anemia 03/12/2014  . Arthritis   . Asthma    PFT 02/06/09 FEV1 1.41 (65%), FVC 1.92 (64), FEV1% 74, TLC 3.47 (71%), DLCO 48%, +BD  . Atypical chest pain    s/p cath, Normal coronaries, Non ST elevation myocardial infarction, Rt groin pseudoaneurysm  . Bacterial vaginosis 03/12/2014  . COPD (chronic obstructive pulmonary disease) ([place])   . Depression   . DVT (deep venous thrombosis) ([place]) 1987  . Fall at home 12/04/2014  . GERD (gastroesophageal reflux disease)   . Gout   . Hypercalcemia 10/15/2014  . Hyperlipidemia   . Hypertension   . Hypoxia 10/15/2014  . Macular degeneration 04/10/2015  . Osteopenia 12/29/2006   Qualifier: Diagnosis of  By: Wynona Luna   . Pneumonia   . Sun-damaged skin 10/24/2012  . Unspecified constipation 06/05/2013  . Urinary frequency 10/24/2012  . UTI (lower urinary tract infection) 10/24/2012  . Vitamin D deficiency 01/01/2015    Past Surgical History:  Procedure Laterality Date  . APPENDECTOMY  1951  . TONSILLECTOMY  1950  . TUBAL LIGATION  1968    Family History  Problem Relation Age of Onset  . Asthma Sister   . Arthritis Sister   . Hypertension Sister   . Hyperlipidemia Sister   . Cancer Sister 49    uterine  . Uterine cancer Sister   . Coronary artery disease Brother     x2  . Cancer Brother      lung, smoker  . Arthritis Brother   . Coronary artery disease Other   . Hypertension Sister   . Arthritis Sister   . Hyperlipidemia Sister   . Emphysema Sister   . COPD Sister     smoker  . Heart disease Sister   . Hyperlipidemia Sister   . Hypertension Sister   . Arthritis Sister   . Emphysema Brother   . Heart disease Brother     MI smoker  . Colon polyps Sister   . Mental illness Father   . Alcohol abuse Father   . Heart disease Mother     MI  . Hyperlipidemia Mother   . Epilepsy Daughter   . Hypertension Daughter   . Obesity Daughter   . COPD Brother     smoker  . Cancer Brother     lung    Social History   Social History  . Marital status: Widowed    Spouse name: N/A  . Number of children: 1  . Years of education: N/A   Occupational History  . Retired [employer]   Social History Main Topics  . Smoking status: Never Smoker  . Smokeless tobacco: Never Used  . Alcohol use No  . Drug use: No  . Sexual activity: No  Comment: lives alone, no dietary restrictions   Other Topics Concern  . Not on file   Social History Narrative  . No narrative on file    Outpatient Medications Prior to Visit  Medication Sig Dispense Refill  . acetaminophen (TYLENOL) 500 MG tablet Take 500 mg by mouth every 6 (six) hours as needed.    Marland Kitchen albuterol (PROAIR HFA) 108 (90 Base) MCG/ACT inhaler Inhale 2 puffs into the lungs every 4 (four) hours as needed. 3 Inhaler 1  . allopurinol (ZYLOPRIM) 100 MG tablet Take 2 tablets (200 mg total) by mouth daily. 180 tablet 1  . b complex vitamins tablet Take 1 tablet by mouth daily.    . budesonide-formoterol (SYMBICORT) 160-4.5 MCG/ACT inhaler INHALE 2 PUFFS INTO THE LUNGS 2 TIMES DAILY 3 Inhaler 1  . conjugated estrogens (PREMARIN) vaginal cream Place 1 Applicatorful vaginally daily. (Patient taking differently: Place 1 Applicatorful vaginally 3 (three) times a week. ) 42.5 g 12  . Cyanocobalamin (VITAMIN B-12 PO) Take by mouth  daily.    . Ferrous Fumarate-Folic Acid (HEMOCYTE-F) 324-1 MG TABS Take 1 tablet by mouth daily. 30 each 2  . fluticasone (FLONASE) 50 MCG/ACT nasal spray Place 2 sprays into both nostrils daily. 16 g 6  . furosemide (LASIX) 40 MG tablet TAKE 1/2 TABLET EVERY OTHER DAY 23 tablet 6  . glucose blood (ONE TOUCH TEST STRIPS) test strip Use as instructed to check blood sugar once weekly. DX 790.29     . losartan (COZAAR) 25 MG tablet Take 1 tablet (25 mg total) by mouth daily. 90 tablet 1  . metoprolol succinate (TOPROL-XL) 25 MG 24 hr tablet Take 1 tablet (25 mg total) by mouth daily. 90 tablet 1  . Niacin (VITAMIN B-3 PO) Take 1 tablet by mouth daily.    . OXYGEN Inhale into the lungs.    . pantoprazole (PROTONIX) 40 MG tablet Take 1 tablet (40 mg total) by mouth 2 (two) times daily. 180 tablet 1  . potassium chloride SA (K-DUR,KLOR-CON) 20 MEQ tablet TAKE 1 TABLET EVERY DAY 90 tablet 2  . pravastatin (PRAVACHOL) 20 MG tablet Take 1 tablet (20 mg total) by mouth daily. 90 tablet 1  . Probiotic Product (PROBIOTIC PO) Take by mouth daily.    . ranitidine (ZANTAC) 300 MG tablet TAKE 1 TABLET AT BEDTIME 90 tablet 3  . tiotropium (SPIRIVA) 18 MCG inhalation capsule Place 1 capsule (18 mcg total) into inhaler and inhale daily. 90 capsule 1  . traMADol (ULTRAM) 50 MG tablet TAKE ONE TABLET BY MOUTH THREE TIMES DAILY AS NEEDED 90 tablet 0  . albuterol (ACCUNEB) 0.63 MG/3ML nebulizer solution USE ONE VIAL IN NEBULIZER EVERY 6 HOURS AS NEEDED 150 mL 1  . sertraline (ZOLOFT) 50 MG tablet Take 1 tablet (50 mg total) by mouth daily. 90 tablet 3   No facility-administered medications prior to visit.     Allergies  Allergen Reactions  . Ace Inhibitors   . Indomethacin   . Montelukast Sodium     REACTION: Heart palpitations, chest pain  . Oseltamivir Phosphate     REACTION: nausea, vomiting, diarrhea, dizziness  . Sulfa Antibiotics Other (See Comments)    CP  . Sulfonamide Derivatives     Review of  Systems  Constitutional: Negative for fever and malaise/fatigue.  HENT: Negative for congestion.   Eyes: Negative for blurred vision.  Respiratory: Negative for shortness of breath.   Cardiovascular: Positive for leg swelling. Negative for chest pain and palpitations.  Gastrointestinal:  Negative for abdominal pain, blood in stool and nausea.  Genitourinary: Negative for dysuria and frequency.  Musculoskeletal: Positive for joint pain and myalgias. Negative for falls.  Skin: Negative for rash.  Neurological: Negative for dizziness, loss of consciousness and headaches.  Endo/Heme/Allergies: Negative for environmental allergies.  Psychiatric/Behavioral: Negative for depression. The patient is not nervous/anxious.        Objective:    Physical Exam  Constitutional: She is oriented to person, place, and time. She appears well-developed and well-nourished. No distress.  HENT:  Head: Normocephalic and atraumatic.  Nose: Nose normal.  Eyes: Right eye exhibits no discharge. Left eye exhibits no discharge.  Neck: Normal range of motion. Neck supple.  Cardiovascular: Normal rate and regular rhythm.   No murmur heard. Pulmonary/Chest: Effort normal and breath sounds normal.  Abdominal: Soft. Bowel sounds are normal. There is no tenderness.  Musculoskeletal: She exhibits edema.  B/l 1 + LE  Neurological: She is alert and oriented to person, place, and time.  Skin: Skin is warm and dry.  Psychiatric: She has a normal mood and affect.  Nursing note and vitals reviewed.   BP 130/68   Pulse 95   Temp 97.7 F (36.5 C) (Oral)   Ht '5\' 5"'$  (1.651 m)   Wt 221 lb 8 oz (100.5 kg)   SpO2 92%   BMI 36.86 kg/m  Wt Readings from Last 3 Encounters:  08/03/15 221 lb 8 oz (100.5 kg)  04/10/15 225 lb (102.1 kg)  01/01/15 221 lb 6 oz (100.4 kg)     Lab Results  Component Value Date   WBC 7.5 08/03/2015   HGB 11.7 (L) 08/03/2015   HCT 35.8 (L) 08/03/2015   PLT 168.0 08/03/2015   GLUCOSE 96  08/03/2015   CHOL 148 08/03/2015   TRIG 147.0 08/03/2015   HDL 43.30 08/03/2015   LDLDIRECT 142.7 12/16/2005   LDLCALC 76 08/03/2015   ALT 11 08/03/2015   AST 17 08/03/2015   NA 140 08/03/2015   K 4.7 08/03/2015   CL 105 08/03/2015   CREATININE 1.45 (H) 08/03/2015   BUN 21 08/03/2015   CO2 28 08/03/2015   TSH 2.74 08/03/2015   INR 1.0 09/12/2006   HGBA1C 5.7 08/03/2015   MICROALBUR 0.8 02/28/2014    Lab Results  Component Value Date   TSH 2.74 08/03/2015   Lab Results  Component Value Date   WBC 7.5 08/03/2015   HGB 11.7 (L) 08/03/2015   HCT 35.8 (L) 08/03/2015   MCV 92.3 08/03/2015   PLT 168.0 08/03/2015   Lab Results  Component Value Date   NA 140 08/03/2015   K 4.7 08/03/2015   CO2 28 08/03/2015   GLUCOSE 96 08/03/2015   BUN 21 08/03/2015   CREATININE 1.45 (H) 08/03/2015   BILITOT 0.5 08/03/2015   ALKPHOS 54 08/03/2015   AST 17 08/03/2015   ALT 11 08/03/2015   PROT 7.4 08/03/2015   ALBUMIN 4.3 08/03/2015   CALCIUM 10.1 08/03/2015   ANIONGAP 10 01/12/2014   GFR 37.48 (L) 08/03/2015   Lab Results  Component Value Date   CHOL 148 08/03/2015   Lab Results  Component Value Date   HDL 43.30 08/03/2015   Lab Results  Component Value Date   LDLCALC 76 08/03/2015   Lab Results  Component Value Date   TRIG 147.0 08/03/2015   Lab Results  Component Value Date   CHOLHDL 3 08/03/2015   Lab Results  Component Value Date   HGBA1C 5.7 08/03/2015  Assessment & Plan:   Problem List Items Addressed This Visit    Essential hypertension   Relevant Medications   aspirin EC 81 MG tablet   Other Relevant Orders   Comp Met (CMET) (Completed)   TSH (Completed)   CBC w/Diff (Completed)   Lipid panel (Completed)   Hemoglobin A1c (Completed)   Vitamin D (25 hydroxy) (Completed)   Uric acid (Completed)   Urinalysis   Diabetes type 2, controlled ([place]) - Primary    hgba1c acceptable, minimize simple carbs. Increase exercise as tolerated. Continue  current meds      Relevant Medications   aspirin EC 81 MG tablet   Other Relevant Orders   Comp Met (CMET) (Completed)   TSH (Completed)   CBC w/Diff (Completed)   Lipid panel (Completed)   Hemoglobin A1c (Completed)   Vitamin D (25 hydroxy) (Completed)   Uric acid (Completed)   Urinalysis   Hyperlipidemia, mixed   Relevant Medications   aspirin EC 81 MG tablet   Other Relevant Orders   Comp Met (CMET) (Completed)   TSH (Completed)   CBC w/Diff (Completed)   Lipid panel (Completed)   Hemoglobin A1c (Completed)   Vitamin D (25 hydroxy) (Completed)   Uric acid (Completed)   Urinalysis   Gout    No pain or recent flares      Relevant Orders   Comp Met (CMET) (Completed)   TSH (Completed)   CBC w/Diff (Completed)   Lipid panel (Completed)   Hemoglobin A1c (Completed)   Vitamin D (25 hydroxy) (Completed)   Uric acid (Completed)   Urinalysis   Osteopenia   Relevant Orders   Comp Met (CMET) (Completed)   TSH (Completed)   CBC w/Diff (Completed)   Lipid panel (Completed)   Hemoglobin A1c (Completed)   Vitamin D (25 hydroxy) (Completed)   Uric acid (Completed)   Urinalysis   UTI (lower urinary tract infection)   Relevant Medications   cephALEXin (KEFLEX) 500 MG capsule   Other Relevant Orders   Comp Met (CMET) (Completed)   TSH (Completed)   CBC w/Diff (Completed)   Lipid panel (Completed)   Hemoglobin A1c (Completed)   Vitamin D (25 hydroxy) (Completed)   Uric acid (Completed)   Urinalysis   Tachycardia   Relevant Orders   Comp Met (CMET) (Completed)   TSH (Completed)   CBC w/Diff (Completed)   Lipid panel (Completed)   Hemoglobin A1c (Completed)   Vitamin D (25 hydroxy) (Completed)   Uric acid (Completed)   Urinalysis   Anemia    Increase leafy greens, consider increased lean red meat and using cast iron cookware. Continue to monitor, report any concerns      Hypercalcemia   Relevant Orders   Comp Met (CMET) (Completed)   TSH (Completed)   CBC  w/Diff (Completed)   Lipid panel (Completed)   Hemoglobin A1c (Completed)   Vitamin D (25 hydroxy) (Completed)   Uric acid (Completed)   Urinalysis   Pedal edema    Left calf pain as well. Ultrasound negative for DVT. encouraged to elevate feet, minimize sodium, stay active and use conpresssion hose. May use Furosemide prn      Relevant Orders   Comp Met (CMET) (Completed)   TSH (Completed)   CBC w/Diff (Completed)   Lipid panel (Completed)   Hemoglobin A1c (Completed)   Vitamin D (25 hydroxy) (Completed)   Uric acid (Completed)   US Venous Img Lower Unilateral Left (Completed)   Urinalysis    Other Visit  Diagnoses    Calf pain, left       Relevant Orders   US Venous Img Lower Unilateral Left (Completed)   Urinalysis      I am having Ms. Dase start on cephALEXin and aspirin EC. I am also having her maintain her glucose blood, acetaminophen, Niacin (VITAMIN B-3 PO), Cyanocobalamin (VITAMIN B-12 PO), conjugated estrogens, OXYGEN, fluticasone, furosemide, b complex vitamins, Probiotic Product (PROBIOTIC PO), Ferrous Fumarate-Folic Acid, pantoprazole, tiotropium, potassium chloride SA, albuterol, budesonide-formoterol, losartan, pravastatin, ranitidine, traMADol, allopurinol, metoprolol succinate, and sertraline.  Meds ordered this encounter  Medications  . sertraline (ZOLOFT) 50 MG tablet    Sig: Take 1 tablet (50 mg total) by mouth daily.    Dispense:  90 tablet    Refill:  3  . cephALEXin (KEFLEX) 500 MG capsule    Sig: Take 1 capsule (500 mg total) by mouth 3 (three) times daily.    Dispense:  30 capsule    Refill:  0  . aspirin EC 81 MG tablet    Sig: Take 1 tablet (81 mg total) by mouth daily.     Penni Homans, MD

## 2015-08-28 NOTE — Telephone Encounter (Signed)
Ok to give refill for 30 tablets in PCP absence. Will be up to PCP discretion regarding further refills.

## 2015-08-28 NOTE — Assessment & Plan Note (Signed)
No pain or recent flares

## 2015-08-28 NOTE — Assessment & Plan Note (Addendum)
Left calf pain as well. Ultrasound negative for DVT. encouraged to elevate feet, minimize sodium, stay active and use conpresssion hose. May use Furosemide prn

## 2015-08-28 NOTE — Telephone Encounter (Signed)
Requesting:  Tramadol Contract  10/28/2013 UDS  None Last OV   08/03/2015 Last Refill   #90 with 1 refill on 07/02/2015  Please Advise

## 2015-08-29 NOTE — Telephone Encounter (Signed)
Cannot put refills on Tramadol anymore so refill was invalid. Can refill for 30 day supply

## 2015-08-29 NOTE — Telephone Encounter (Signed)
Will defer to PCP for full 30-day supply, as RTO date is tomorrow, Aug. 10, 2017/SLS 08/09

## 2015-08-30 NOTE — Telephone Encounter (Signed)
Faxed hardcopy for Tramadol to [facility]

## 2015-09-04 ENCOUNTER — Telehealth: Payer: Self-pay

## 2015-09-04 NOTE — Telephone Encounter (Signed)
Called [facility] on [address] and canceled refill sent in on 08/30/2015 Faxed refill then to [facility]. Patient contacted and informed refill request is done.

## 2015-09-04 NOTE — Telephone Encounter (Signed)
Patient is requesting a refill for Tramadol, also requesting it be sent to [facility] order instead of [facility] because she does not like [place] service  [phone]

## 2015-09-13 ENCOUNTER — Telehealth: Payer: Self-pay | Admitting: Family Medicine

## 2015-09-13 NOTE — Telephone Encounter (Signed)
Patient informed refill done on 08/30/2015 to [facility]. She stated she did not get.  I informed she would need to call her pharmacy to pickup that refill and if have any problems to call us back.

## 2015-09-13 NOTE — Telephone Encounter (Signed)
Relation to [id number] Call back number:[phone] Pharmacy:  [facility] ([address]), [place] - [place] [phone] (Phone) [phone] (Fax)     Reason for call:  Patient requesting a refill traMADol (ULTRAM) 50 MG tablet. Patient states pharmacy never received.

## 2015-09-21 DIAGNOSIS — J4520 Mild intermittent asthma, uncomplicated: Secondary | ICD-10-CM | POA: Diagnosis not present

## 2015-09-21 DIAGNOSIS — R0902 Hypoxemia: Secondary | ICD-10-CM | POA: Diagnosis not present

## 2015-09-25 ENCOUNTER — Ambulatory Visit (INDEPENDENT_AMBULATORY_CARE_PROVIDER_SITE_OTHER): Payer: Commercial Managed Care - HMO | Admitting: Family

## 2015-09-25 VITALS — BP 103/73 | HR 103 | Temp 98.0°F | Resp 19 | Ht 65.0 in | Wt 233.4 lb

## 2015-09-25 DIAGNOSIS — Z23 Encounter for immunization: Secondary | ICD-10-CM | POA: Diagnosis not present

## 2015-09-25 DIAGNOSIS — H6982 Other specified disorders of Eustachian tube, left ear: Secondary | ICD-10-CM | POA: Diagnosis not present

## 2015-09-25 DIAGNOSIS — R42 Dizziness and giddiness: Secondary | ICD-10-CM

## 2015-09-25 DIAGNOSIS — J441 Chronic obstructive pulmonary disease with (acute) exacerbation: Secondary | ICD-10-CM

## 2015-09-25 DIAGNOSIS — W19XXXA Unspecified fall, initial encounter: Secondary | ICD-10-CM

## 2015-09-25 DIAGNOSIS — J029 Acute pharyngitis, unspecified: Secondary | ICD-10-CM | POA: Diagnosis not present

## 2015-09-25 LAB — POCT RAPID STREP A (OFFICE): RAPID STREP A SCREEN: NEGATIVE

## 2015-09-25 NOTE — Progress Notes (Signed)
Subjective:    Patient ID: [name], female    DOB: Feb 10, 1940, 75 y.o.   MRN: [id number]  HPI  [name] is a 75 yr old female who presents today with chief complaint of left ear pain. Has had left ear pain x 2 days.  She denies associated  fever.  Sore throat began last night.  Has been using benadryl and an alka-selzer cold tablet last night with good (temporary)  relief of her symptoms.    Review of Systems See HPI  Past Medical History:  Diagnosis Date  . Abnormal glucose tolerance test   . ACE-inhibitor cough   . Anemia 03/12/2014  . Arthritis   . Asthma    PFT 02/06/09 FEV1 1.41 (65%), FVC 1.92 (64), FEV1% 74, TLC 3.47 (71%), DLCO 48%, +BD  . Atypical chest pain    s/p cath, Normal coronaries, Non ST elevation myocardial infarction, Rt groin pseudoaneurysm  . Bacterial vaginosis 03/12/2014  . COPD (chronic obstructive pulmonary disease) ([place])   . Depression   . DVT (deep venous thrombosis) ([place]) 1987  . Fall at home 12/04/2014  . GERD (gastroesophageal reflux disease)   . Gout   . Hypercalcemia 10/15/2014  . Hyperlipidemia   . Hypertension   . Hypoxia 10/15/2014  . Macular degeneration 04/10/2015  . Osteopenia 12/29/2006   Qualifier: Diagnosis of  By: Wynona Luna   . Pneumonia   . Sun-damaged skin 10/24/2012  . Unspecified constipation 06/05/2013  . Urinary frequency 10/24/2012  . UTI (lower urinary tract infection) 10/24/2012  . Vitamin D deficiency 01/01/2015     Social History   Social History  . Marital status: Widowed    Spouse name: N/A  . Number of children: 1  . Years of education: N/A   Occupational History  . Retired [employer]   Social History Main Topics  . Smoking status: Never Smoker  . Smokeless tobacco: Never Used  . Alcohol use No  . Drug use: No  . Sexual activity: No     Comment: lives alone, no dietary restrictions   Other Topics Concern  . Not on file   Social History Narrative  . No narrative on file    Past Surgical  History:  Procedure Laterality Date  . APPENDECTOMY  1951  . TONSILLECTOMY  1950  . TUBAL LIGATION  1968    Family History  Problem Relation Age of Onset  . Asthma Sister   . Arthritis Sister   . Hypertension Sister   . Hyperlipidemia Sister   . Cancer Sister 3    uterine  . Uterine cancer Sister   . Coronary artery disease Brother     x2  . Cancer Brother     lung, smoker  . Arthritis Brother   . Coronary artery disease Other   . Hypertension Sister   . Arthritis Sister   . Hyperlipidemia Sister   . Emphysema Sister   . COPD Sister     smoker  . Heart disease Sister   . Hyperlipidemia Sister   . Hypertension Sister   . Arthritis Sister   . Emphysema Brother   . Heart disease Brother     MI smoker  . Colon polyps Sister   . Mental illness Father   . Alcohol abuse Father   . Heart disease Mother     MI  . Hyperlipidemia Mother   . Epilepsy Daughter   . Hypertension Daughter   . Obesity Daughter   .  COPD Brother     smoker  . Cancer Brother     lung    Allergies  Allergen Reactions  . Ace Inhibitors   . Indomethacin   . Montelukast Sodium     REACTION: Heart palpitations, chest pain  . Oseltamivir Phosphate     REACTION: nausea, vomiting, diarrhea, dizziness  . Sulfa Antibiotics Other (See Comments)    CP  . Sulfonamide Derivatives     Current Outpatient Prescriptions on File Prior to Visit  Medication Sig Dispense Refill  . acetaminophen (TYLENOL) 500 MG tablet Take 500 mg by mouth every 6 (six) hours as needed.    Marland Kitchen albuterol (ACCUNEB) 0.63 MG/3ML nebulizer solution USE ONE VIAL IN NEBULIZER EVERY 6 HOURS AS NEEDED 150 mL 1  . albuterol (PROAIR HFA) 108 (90 Base) MCG/ACT inhaler Inhale 2 puffs into the lungs every 4 (four) hours as needed. 3 Inhaler 1  . allopurinol (ZYLOPRIM) 100 MG tablet Take 2 tablets (200 mg total) by mouth daily. 180 tablet 1  . aspirin EC 81 MG tablet Take 1 tablet (81 mg total) by mouth daily.    Marland Kitchen b complex vitamins  tablet Take 1 tablet by mouth daily.    . budesonide-formoterol (SYMBICORT) 160-4.5 MCG/ACT inhaler INHALE 2 PUFFS INTO THE LUNGS 2 TIMES DAILY 3 Inhaler 1  . cephALEXin (KEFLEX) 500 MG capsule Take 1 capsule (500 mg total) by mouth 3 (three) times daily. 30 capsule 0  . Cyanocobalamin (VITAMIN B-12 PO) Take by mouth daily.    . Ferrous Fumarate-Folic Acid (HEMOCYTE-F) 324-1 MG TABS Take 1 tablet by mouth daily. 30 each 2  . fluticasone (FLONASE) 50 MCG/ACT nasal spray Place 2 sprays into both nostrils daily. 16 g 6  . furosemide (LASIX) 40 MG tablet TAKE 1/2 TABLET EVERY OTHER DAY 23 tablet 6  . glucose blood (ONE TOUCH TEST STRIPS) test strip Use as instructed to check blood sugar once weekly. DX 790.29     . losartan (COZAAR) 25 MG tablet Take 1 tablet (25 mg total) by mouth daily. 90 tablet 1  . metoprolol succinate (TOPROL-XL) 25 MG 24 hr tablet Take 1 tablet (25 mg total) by mouth daily. 90 tablet 1  . Niacin (VITAMIN B-3 PO) Take 1 tablet by mouth daily.    . OXYGEN Inhale into the lungs.    . pantoprazole (PROTONIX) 40 MG tablet TAKE 1 TABLET TWICE DAILY 180 tablet 1  . potassium chloride SA (K-DUR,KLOR-CON) 20 MEQ tablet TAKE 1 TABLET EVERY DAY 90 tablet 2  . pravastatin (PRAVACHOL) 20 MG tablet Take 1 tablet (20 mg total) by mouth daily. 90 tablet 1  . Probiotic Product (PROBIOTIC PO) Take by mouth daily.    . ranitidine (ZANTAC) 300 MG tablet TAKE 1 TABLET AT BEDTIME 90 tablet 3  . sertraline (ZOLOFT) 50 MG tablet Take 1 tablet (50 mg total) by mouth daily. 90 tablet 3  . tiotropium (SPIRIVA) 18 MCG inhalation capsule Place 1 capsule (18 mcg total) into inhaler and inhale daily. 90 capsule 1  . traMADol (ULTRAM) 50 MG tablet TAKE ONE TABLET BY MOUTH THREE TIMES DAILY AS NEEDED 90 tablet 0   No current facility-administered medications on file prior to visit.     BP 103/73 (BP Location: Right Arm, Patient Position: Sitting, Cuff Size: Large)   Pulse (!) 103   Temp 98 F (36.7 C)  (Oral)   Resp 19   Ht 5\' 5"  (1.651 m)   Wt 233 lb 6.4 oz (105.9  kg)   SpO2 95%   BMI 38.84 kg/m       Objective:   Physical Exam  Constitutional: She appears well-developed and well-nourished.  HENT:  Head: Normocephalic and atraumatic.  Left Ear: Tympanic membrane and ear canal normal.  L TM is occluded by cerumen. Mild oropharyngeal erythema without exudate  Cardiovascular: Normal rate, regular rhythm and normal heart sounds.   No murmur heard. Pulmonary/Chest: Effort normal. No respiratory distress. She has wheezes in the right upper field. She has rales in the left lower field.  Lymphadenopathy:    She has no cervical adenopathy.  Psychiatric: She has a normal mood and affect. Her behavior is normal. Judgment and thought content normal.          Assessment & Plan:  Eustachian tube dysfunction- Left ear is normal on exam. Advised pt to resume flonase (has not been using recently) and continue daily claritin.   COPD- some mild wheezing today, advised pt to continue her current meds and add albuterol treatments several times daily.   + fall screen- she would like referral to PT.

## 2015-09-25 NOTE — Addendum Note (Signed)
Addended by: Marjory Lies on: 09/25/2015 04:18 PM   Modules accepted: Orders

## 2015-09-25 NOTE — Patient Instructions (Addendum)
Please restart flonase. Continue claritin once daily. Continue your albuterol every 6 hours as needed.   Call if new/worsening symptoms or if not improved in 3-4 days.

## 2015-09-25 NOTE — Progress Notes (Signed)
Pre visit review using our clinic review tool, if applicable. No additional management support is needed unless otherwise documented below in the visit note. 

## 2015-10-08 ENCOUNTER — Ambulatory Visit (INDEPENDENT_AMBULATORY_CARE_PROVIDER_SITE_OTHER): Payer: Commercial Managed Care - HMO | Admitting: Family Medicine

## 2015-10-08 DIAGNOSIS — E782 Mixed hyperlipidemia: Secondary | ICD-10-CM | POA: Diagnosis not present

## 2015-10-08 DIAGNOSIS — K219 Gastro-esophageal reflux disease without esophagitis: Secondary | ICD-10-CM | POA: Diagnosis not present

## 2015-10-08 DIAGNOSIS — M8580 Other specified disorders of bone density and structure, unspecified site: Secondary | ICD-10-CM

## 2015-10-08 DIAGNOSIS — I10 Essential (primary) hypertension: Secondary | ICD-10-CM | POA: Diagnosis not present

## 2015-10-08 DIAGNOSIS — J309 Allergic rhinitis, unspecified: Secondary | ICD-10-CM

## 2015-10-08 DIAGNOSIS — E118 Type 2 diabetes mellitus with unspecified complications: Secondary | ICD-10-CM

## 2015-10-08 MED ORDER — MONTELUKAST SODIUM 10 MG PO TABS: 10.0000 mg | ORAL_TABLET | Freq: Every evening | ORAL | 3 refills | Status: DC | PRN

## 2015-10-08 MED ORDER — TRAMADOL HCL 50 MG PO TABS: 50.0000 mg | ORAL_TABLET | Freq: Three times a day (TID) | ORAL | 0 refills | Status: DC | PRN

## 2015-10-08 NOTE — Patient Instructions (Signed)
For allergies/congestion. Zyrtec 10 mg daily can be alone or with other meds  nasal saline flushes after working outsied Flonase 2 sprays to each nostril as needed.   Basic Carbohydrate Counting Carbohydrate counting is a method for keeping track of the amount of carbohydrates you eat. Eating carbohydrates naturally increases the level of sugar (glucose) in your blood, so it is important for you to know the amount that is okay for you to have in every meal. Carbohydrate counting helps keep the level of glucose in your blood within normal limits. The amount of carbohydrates allowed is different for every person. A dietitian can help you calculate the amount that is right for you. Once you know the amount of carbohydrates you can have, you can count the carbohydrates in the foods you want to eat. Carbohydrates are found in the following foods:  Grains, such as breads and cereals.  Dried beans and soy products.  Starchy vegetables, such as potatoes, peas, and corn.  Fruit and fruit juices.  Milk and yogurt.  Sweets and snack foods, such as cake, cookies, candy, chips, soft drinks, and fruit drinks. CARBOHYDRATE COUNTING There are two ways to count the carbohydrates in your food. You can use either of the methods or a combination of both. Reading the "Nutrition Facts" on [place] The "Nutrition Facts" is an area that is included on the labels of almost all packaged food and beverages in the [place]. It includes the serving size of that food or beverage and information about the nutrients in each serving of the food, including the grams (g) of carbohydrate per serving.  Decide the number of servings of this food or beverage that you will be able to eat or drink. Multiply that number of servings by the number of grams of carbohydrate that is listed on the label for that serving. The total will be the amount of carbohydrates you will be having when you eat or drink this food or  beverage. Learning Standard Serving Sizes of Food When you eat food that is not packaged or does not include "Nutrition Facts" on the label, you need to measure the servings in order to count the amount of carbohydrates.A serving of most carbohydrate-rich foods contains about 15 g of carbohydrates. The following list includes serving sizes of carbohydrate-rich foods that provide 15 g ofcarbohydrate per serving:   1 slice of bread (1 oz) or 1 six-inch tortilla.    of a hamburger bun or English muffin.  4-6 crackers.   cup unsweetened dry cereal.    cup hot cereal.   cup rice or pasta.    cup mashed potatoes or  of a large baked potato.  1 cup fresh fruit or one small piece of fruit.    cup canned or frozen fruit or fruit juice.  1 cup milk.   cup plain fat-free yogurt or yogurt sweetened with artificial sweeteners.   cup cooked dried beans or starchy vegetable, such as peas, corn, or potatoes.  Decide the number of standard-size servings that you will eat. Multiply that number of servings by 15 (the grams of carbohydrates in that serving). For example, if you eat 2 cups of strawberries, you will have eaten 2 servings and 30 g of carbohydrates (2 servings x 15 g = 30 g). For foods such as soups and casseroles, in which more than one food is mixed in, you will need to count the carbohydrates in each food that is included. EXAMPLE OF CARBOHYDRATE COUNTING Sample  Dinner  3 oz chicken breast.   cup of brown rice.   cup of corn.  1 cup milk.   1 cup strawberries with sugar-free whipped topping.  Carbohydrate Calculation Step 1: Identify the foods that contain carbohydrates:   Rice.   Corn.   Milk.   Strawberries. Step 2:Calculate the number of servings eaten of each:   2 servings of rice.   1 serving of corn.   1 serving of milk.   1 serving of strawberries. Step 3: Multiply each of those number of servings by 15 g:   2 servings of  rice x 15 g = 30 g.   1 serving of corn x 15 g = 15 g.   1 serving of milk x 15 g = 15 g.   1 serving of strawberries x 15 g = 15 g. Step 4: Add together all of the amounts to find the total grams of carbohydrates eaten: 30 g + 15 g + 15 g + 15 g = 75 g.   This information is not intended to replace advice given to you by your health care provider. Make sure you discuss any questions you have with your health care provider.   Document Released: 01/06/2005 Document Revised: 01/27/2014 Document Reviewed: 12/03/2012 Elsevier Interactive Patient Education [employer].

## 2015-10-08 NOTE — Progress Notes (Signed)
Pre visit review using our clinic review tool, if applicable. No additional management support is needed unless otherwise documented below in the visit note. 

## 2015-10-14 NOTE — Assessment & Plan Note (Signed)
Well controlled, no changes to meds. Encouraged heart healthy diet such as the DASH diet and exercise as tolerated.  °

## 2015-10-14 NOTE — Assessment & Plan Note (Signed)
Tolerating statin, encouraged heart healthy diet, avoid trans fats, minimize simple carbs and saturated fats. Increase exercise as tolerated 

## 2015-10-14 NOTE — Progress Notes (Signed)
Patient ID: [name], female   DOB: [birth year], 75 y.o.   MRN: [id number]   Subjective:    Patient ID: [name], female    DOB: 02-Jun-1940, 75 y.o.   MRN: [id number]  Chief Complaint  Patient presents with  . Follow-up    HPI Patient is in today for follow up. She feels well today. No recent illness or acute concerns. She is noting her sugars are well controlled. No polyuria or polydipsia. Does note some congestion and sense of mucus in her throat. Denies CP/palp/SOB/HA/fevers or GU c/o. Taking meds as prescribed  Past Medical History:  Diagnosis Date  . Abnormal glucose tolerance test   . ACE-inhibitor cough   . Anemia 03/12/2014  . Arthritis   . Asthma    PFT 02/06/09 FEV1 1.41 (65%), FVC 1.92 (64), FEV1% 74, TLC 3.47 (71%), DLCO 48%, +BD  . Atypical chest pain    s/p cath, Normal coronaries, Non ST elevation myocardial infarction, Rt groin pseudoaneurysm  . Bacterial vaginosis 03/12/2014  . COPD (chronic obstructive pulmonary disease) ([place])   . Depression   . DVT (deep venous thrombosis) ([place]) 1987  . Fall at home 12/04/2014  . GERD (gastroesophageal reflux disease)   . Gout   . Hypercalcemia 10/15/2014  . Hyperlipidemia   . Hypertension   . Hypoxia 10/15/2014  . Macular degeneration 04/10/2015  . Osteopenia 12/29/2006   Qualifier: Diagnosis of  By: Wynona Luna   . Pneumonia   . Sun-damaged skin 10/24/2012  . Unspecified constipation 06/05/2013  . Urinary frequency 10/24/2012  . UTI (lower urinary tract infection) 10/24/2012  . Vitamin D deficiency 01/01/2015    Past Surgical History:  Procedure Laterality Date  . APPENDECTOMY  1951  . TONSILLECTOMY  1950  . TUBAL LIGATION  1968    Family History  Problem Relation Age of Onset  . Asthma Sister   . Arthritis Sister   . Hypertension Sister   . Hyperlipidemia Sister   . Cancer Sister 38    uterine  . Uterine cancer Sister   . Coronary artery disease Brother     x2  . Cancer Brother     lung, smoker   . Arthritis Brother   . Coronary artery disease Other   . Hypertension Sister   . Arthritis Sister   . Hyperlipidemia Sister   . Emphysema Sister   . COPD Sister     smoker  . Heart disease Sister   . Hyperlipidemia Sister   . Hypertension Sister   . Arthritis Sister   . Emphysema Brother   . Heart disease Brother     MI smoker  . Colon polyps Sister   . Mental illness Father   . Alcohol abuse Father   . Heart disease Mother     MI  . Hyperlipidemia Mother   . Epilepsy Daughter   . Hypertension Daughter   . Obesity Daughter   . COPD Brother     smoker  . Cancer Brother     lung    Social History   Social History  . Marital status: Widowed    Spouse name: N/A  . Number of children: 1  . Years of education: N/A   Occupational History  . Retired [employer]   Social History Main Topics  . Smoking status: Never Smoker  . Smokeless tobacco: Never Used  . Alcohol use No  . Drug use: No  . Sexual activity: No     Comment:  lives alone, no dietary restrictions   Other Topics Concern  . Not on file   Social History Narrative  . No narrative on file    Outpatient Medications Prior to Visit  Medication Sig Dispense Refill  . acetaminophen (TYLENOL) 500 MG tablet Take 500 mg by mouth every 6 (six) hours as needed.    Marland Kitchen albuterol (ACCUNEB) 0.63 MG/3ML nebulizer solution USE ONE VIAL IN NEBULIZER EVERY 6 HOURS AS NEEDED 150 mL 1  . albuterol (PROAIR HFA) 108 (90 Base) MCG/ACT inhaler Inhale 2 puffs into the lungs every 4 (four) hours as needed. 3 Inhaler 1  . allopurinol (ZYLOPRIM) 100 MG tablet Take 2 tablets (200 mg total) by mouth daily. 180 tablet 1  . aspirin EC 81 MG tablet Take 1 tablet (81 mg total) by mouth daily.    Marland Kitchen b complex vitamins tablet Take 1 tablet by mouth daily.    . budesonide-formoterol (SYMBICORT) 160-4.5 MCG/ACT inhaler INHALE 2 PUFFS INTO THE LUNGS 2 TIMES DAILY 3 Inhaler 1  . cephALEXin (KEFLEX) 500 MG capsule Take 1 capsule (500 mg total)  by mouth 3 (three) times daily. 30 capsule 0  . Cyanocobalamin (VITAMIN B-12 PO) Take by mouth daily.    . Ferrous Fumarate-Folic Acid (HEMOCYTE-F) 324-1 MG TABS Take 1 tablet by mouth daily. 30 each 2  . fluticasone (FLONASE) 50 MCG/ACT nasal spray Place 2 sprays into both nostrils daily. 16 g 6  . furosemide (LASIX) 40 MG tablet TAKE 1/2 TABLET EVERY OTHER DAY 23 tablet 6  . glucose blood (ONE TOUCH TEST STRIPS) test strip Use as instructed to check blood sugar once weekly. DX 790.29     . losartan (COZAAR) 25 MG tablet Take 1 tablet (25 mg total) by mouth daily. 90 tablet 1  . metoprolol succinate (TOPROL-XL) 25 MG 24 hr tablet Take 1 tablet (25 mg total) by mouth daily. 90 tablet 1  . Niacin (VITAMIN B-3 PO) Take 1 tablet by mouth daily.    . OXYGEN Inhale into the lungs.    . pantoprazole (PROTONIX) 40 MG tablet TAKE 1 TABLET TWICE DAILY 180 tablet 1  . potassium chloride SA (K-DUR,KLOR-CON) 20 MEQ tablet TAKE 1 TABLET EVERY DAY 90 tablet 2  . pravastatin (PRAVACHOL) 20 MG tablet Take 1 tablet (20 mg total) by mouth daily. 90 tablet 1  . Probiotic Product (PROBIOTIC PO) Take by mouth daily.    . ranitidine (ZANTAC) 300 MG tablet TAKE 1 TABLET AT BEDTIME 90 tablet 3  . sertraline (ZOLOFT) 50 MG tablet Take 1 tablet (50 mg total) by mouth daily. 90 tablet 3  . tiotropium (SPIRIVA) 18 MCG inhalation capsule Place 1 capsule (18 mcg total) into inhaler and inhale daily. 90 capsule 1  . traMADol (ULTRAM) 50 MG tablet TAKE ONE TABLET BY MOUTH THREE TIMES DAILY AS NEEDED 90 tablet 0   No facility-administered medications prior to visit.     Allergies  Allergen Reactions  . Ace Inhibitors   . Indomethacin   . Montelukast Sodium     REACTION: Heart palpitations, chest pain  . Oseltamivir Phosphate     REACTION: nausea, vomiting, diarrhea, dizziness  . Sulfa Antibiotics Other (See Comments)    CP  . Sulfonamide Derivatives     Review of Systems  Constitutional: Negative for fever and  malaise/fatigue.  HENT: Negative for congestion.   Eyes: Negative for blurred vision.  Respiratory: Negative for shortness of breath.   Cardiovascular: Negative for chest pain, palpitations and leg swelling.  Gastrointestinal: Negative for abdominal pain and nausea.  Genitourinary: Negative for dysuria and frequency.  Musculoskeletal: Negative for falls.  Skin: Negative for rash.  Neurological: Negative for dizziness, loss of consciousness and headaches.  Endo/Heme/Allergies: Negative for environmental allergies.  Psychiatric/Behavioral: Negative for depression. The patient is not nervous/anxious.        Objective:    Physical Exam  Constitutional: She is oriented to person, place, and time. She appears well-developed and well-nourished. No distress.  HENT:  Head: Normocephalic and atraumatic.  Nose: Nose normal.  Eyes: Right eye exhibits no discharge. Left eye exhibits no discharge.  Neck: Normal range of motion. Neck supple.  Cardiovascular: Normal rate and regular rhythm.   Murmur heard. Pulmonary/Chest: Effort normal and breath sounds normal.  Abdominal: Soft. Bowel sounds are normal. There is no tenderness.  Musculoskeletal: She exhibits no edema.  Neurological: She is alert and oriented to person, place, and time.  Skin: Skin is warm and dry.  Psychiatric: She has a normal mood and affect.  Nursing note and vitals reviewed.   BP (!) 110/59 (BP Location: Left Arm, Patient Position: Sitting, Cuff Size: Normal)   Pulse 97   Temp 98.5 F (36.9 C) (Oral)   Wt 231 lb 9.6 oz (105.1 kg)   SpO2 95%   BMI 38.54 kg/m  Wt Readings from Last 3 Encounters:  10/08/15 231 lb 9.6 oz (105.1 kg)  09/25/15 233 lb 6.4 oz (105.9 kg)  08/03/15 221 lb 8 oz (100.5 kg)     Lab Results  Component Value Date   WBC 7.5 08/03/2015   HGB 11.7 (L) 08/03/2015   HCT 35.8 (L) 08/03/2015   PLT 168.0 08/03/2015   GLUCOSE 96 08/03/2015   CHOL 148 08/03/2015   TRIG 147.0 08/03/2015   HDL  43.30 08/03/2015   LDLDIRECT 142.7 12/16/2005   LDLCALC 76 08/03/2015   ALT 11 08/03/2015   AST 17 08/03/2015   NA 140 08/03/2015   K 4.7 08/03/2015   CL 105 08/03/2015   CREATININE 1.45 (H) 08/03/2015   BUN 21 08/03/2015   CO2 28 08/03/2015   TSH 2.74 08/03/2015   INR 1.0 09/12/2006   HGBA1C 5.7 08/03/2015   MICROALBUR 0.8 02/28/2014    Lab Results  Component Value Date   TSH 2.74 08/03/2015   Lab Results  Component Value Date   WBC 7.5 08/03/2015   HGB 11.7 (L) 08/03/2015   HCT 35.8 (L) 08/03/2015   MCV 92.3 08/03/2015   PLT 168.0 08/03/2015   Lab Results  Component Value Date   NA 140 08/03/2015   K 4.7 08/03/2015   CO2 28 08/03/2015   GLUCOSE 96 08/03/2015   BUN 21 08/03/2015   CREATININE 1.45 (H) 08/03/2015   BILITOT 0.5 08/03/2015   ALKPHOS 54 08/03/2015   AST 17 08/03/2015   ALT 11 08/03/2015   PROT 7.4 08/03/2015   ALBUMIN 4.3 08/03/2015   CALCIUM 10.1 08/03/2015   ANIONGAP 10 01/12/2014   GFR 37.48 (L) 08/03/2015   Lab Results  Component Value Date   CHOL 148 08/03/2015   Lab Results  Component Value Date   HDL 43.30 08/03/2015   Lab Results  Component Value Date   LDLCALC 76 08/03/2015   Lab Results  Component Value Date   TRIG 147.0 08/03/2015   Lab Results  Component Value Date   CHOLHDL 3 08/03/2015   Lab Results  Component Value Date   HGBA1C 5.7 08/03/2015       Assessment &  Plan:   Problem List Items Addressed This Visit    Essential hypertension    Well controlled, no changes to meds. Encouraged heart healthy diet such as the DASH diet and exercise as tolerated.       Allergic rhinitis    With sense of globus at times. Likely multifactorial. Daily antihistamines and Pantoprazole. Consider Flonase if symptoms persist.      GERD    Avoid offending foods, take probiotics. Do not eat large meals in late evening and consider raising head of bed. Pantoprazole daily      Diabetes type 2, controlled (HCC)    hgba1c  acceptable, minimize simple carbs. Increase exercise as tolerated. Continue current meds      Hyperlipidemia, mixed    Tolerating statin, encouraged heart healthy diet, avoid trans fats, minimize simple carbs and saturated fats. Increase exercise as tolerated      Osteopenia    Encouraged to get adequate exercise, calcium and vitamin d intake       Other Visit Diagnoses   None.     I have changed Ms. Hyppolite's traMADol. I am also having her start on montelukast. Additionally, I am having her maintain her glucose blood, acetaminophen, Niacin (VITAMIN B-3 PO), Cyanocobalamin (VITAMIN B-12 PO), OXYGEN, fluticasone, furosemide, b complex vitamins, Probiotic Product (PROBIOTIC PO), Ferrous Fumarate-Folic Acid, tiotropium, potassium chloride SA, albuterol, budesonide-formoterol, losartan, pravastatin, ranitidine, allopurinol, metoprolol succinate, sertraline, cephALEXin, aspirin EC, albuterol, and pantoprazole.  Meds ordered this encounter  Medications  . traMADol (ULTRAM) 50 MG tablet    Sig: Take 1 tablet (50 mg total) by mouth 3 (three) times daily as needed.    Dispense:  90 tablet    Refill:  0  . montelukast (SINGULAIR) 10 MG tablet    Sig: Take 1 tablet (10 mg total) by mouth at bedtime as needed.    Dispense:  30 tablet    Refill:  3     Penni Homans, MD

## 2015-10-14 NOTE — Assessment & Plan Note (Signed)
hgba1c acceptable, minimize simple carbs. Increase exercise as tolerated. Continue current meds 

## 2015-10-14 NOTE — Assessment & Plan Note (Signed)
With sense of globus at times. Likely multifactorial. Daily antihistamines and Pantoprazole. Consider Flonase if symptoms persist.

## 2015-10-14 NOTE — Assessment & Plan Note (Signed)
Avoid offending foods, take probiotics. Do not eat large meals in late evening and consider raising head of bed. Pantoprazole daily

## 2015-10-14 NOTE — Assessment & Plan Note (Signed)
Encouraged to get adequate exercise, calcium and vitamin d intake 

## 2015-10-18 ENCOUNTER — Other Ambulatory Visit: Payer: Self-pay | Admitting: Family Medicine

## 2015-10-21 DIAGNOSIS — R0902 Hypoxemia: Secondary | ICD-10-CM | POA: Diagnosis not present

## 2015-10-21 DIAGNOSIS — J4520 Mild intermittent asthma, uncomplicated: Secondary | ICD-10-CM | POA: Diagnosis not present

## 2015-11-12 ENCOUNTER — Other Ambulatory Visit: Payer: Self-pay | Admitting: Family Medicine

## 2015-11-12 DIAGNOSIS — E785 Hyperlipidemia, unspecified: Secondary | ICD-10-CM

## 2015-11-21 DIAGNOSIS — J4520 Mild intermittent asthma, uncomplicated: Secondary | ICD-10-CM | POA: Diagnosis not present

## 2015-11-21 DIAGNOSIS — R0902 Hypoxemia: Secondary | ICD-10-CM | POA: Diagnosis not present

## 2015-11-29 ENCOUNTER — Other Ambulatory Visit: Payer: Self-pay | Admitting: Family Medicine

## 2015-11-29 NOTE — Telephone Encounter (Signed)
Faxed hardcopy for Tramadol to [facility]

## 2015-12-14 ENCOUNTER — Other Ambulatory Visit: Payer: Self-pay | Admitting: Family Medicine

## 2015-12-20 ENCOUNTER — Ambulatory Visit (HOSPITAL_BASED_OUTPATIENT_CLINIC_OR_DEPARTMENT_OTHER)
Admission: RE | Admit: 2015-12-20 | Discharge: 2015-12-20 | Disposition: A | Discharge status: Home / Self Care | Payer: Commercial Managed Care - HMO | Source: Ambulatory Visit | Attending: Medical | Admitting: Medical

## 2015-12-20 ENCOUNTER — Encounter: Payer: Self-pay | Admitting: Medical

## 2015-12-20 ENCOUNTER — Ambulatory Visit (INDEPENDENT_AMBULATORY_CARE_PROVIDER_SITE_OTHER): Payer: Commercial Managed Care - HMO | Admitting: Medical

## 2015-12-20 VITALS — BP 106/60 | HR 121 | Temp 99.2°F | Ht 65.0 in | Wt 227.6 lb

## 2015-12-20 DIAGNOSIS — R05 Cough: Secondary | ICD-10-CM | POA: Insufficient documentation

## 2015-12-20 DIAGNOSIS — M79661 Pain in right lower leg: Secondary | ICD-10-CM | POA: Insufficient documentation

## 2015-12-20 DIAGNOSIS — R062 Wheezing: Secondary | ICD-10-CM

## 2015-12-20 DIAGNOSIS — R059 Cough, unspecified: Secondary | ICD-10-CM

## 2015-12-20 DIAGNOSIS — L089 Local infection of the skin and subcutaneous tissue, unspecified: Secondary | ICD-10-CM | POA: Diagnosis not present

## 2015-12-20 DIAGNOSIS — R918 Other nonspecific abnormal finding of lung field: Secondary | ICD-10-CM | POA: Insufficient documentation

## 2015-12-20 DIAGNOSIS — M79662 Pain in left lower leg: Secondary | ICD-10-CM

## 2015-12-20 DIAGNOSIS — R600 Localized edema: Secondary | ICD-10-CM | POA: Diagnosis not present

## 2015-12-20 DIAGNOSIS — J209 Acute bronchitis, unspecified: Secondary | ICD-10-CM | POA: Diagnosis not present

## 2015-12-20 DIAGNOSIS — I517 Cardiomegaly: Secondary | ICD-10-CM | POA: Insufficient documentation

## 2015-12-20 LAB — CBC WITH DIFFERENTIAL/PLATELET
BASOS ABS: 0 10*3/uL (ref 0.0–0.1)
Basophils Relative: 0.3 % (ref 0.0–3.0)
EOS ABS: 0.4 10*3/uL (ref 0.0–0.7)
Eosinophils Relative: 4 % (ref 0.0–5.0)
HEMATOCRIT: 35.5 % — AB (ref 36.0–46.0)
HEMOGLOBIN: 11.6 g/dL — AB (ref 12.0–15.0)
LYMPHS PCT: 12.2 % (ref 12.0–46.0)
Lymphs Abs: 1.3 10*3/uL (ref 0.7–4.0)
MCHC: 32.6 g/dL (ref 30.0–36.0)
MCV: 94.1 fl (ref 78.0–100.0)
MONOS PCT: 10.7 % (ref 3.0–12.0)
Monocytes Absolute: 1.2 10*3/uL — ABNORMAL HIGH (ref 0.1–1.0)
Neutro Abs: 8 10*3/uL — ABNORMAL HIGH (ref 1.4–7.7)
Neutrophils Relative %: 72.8 % (ref 43.0–77.0)
Platelets: 152 10*3/uL (ref 150.0–400.0)
RBC: 3.77 Mil/uL — ABNORMAL LOW (ref 3.87–5.11)
RDW: 14.1 % (ref 11.5–15.5)
WBC: 11 10*3/uL — AB (ref 4.0–10.5)

## 2015-12-20 LAB — BRAIN NATRIURETIC PEPTIDE: PRO B NATRI PEPTIDE: 45 pg/mL (ref 0.0–100.0)

## 2015-12-20 LAB — COMPREHENSIVE METABOLIC PANEL
ALBUMIN: 4.2 g/dL (ref 3.5–5.2)
ALK PHOS: 52 U/L (ref 39–117)
ALT: 11 U/L (ref 0–35)
AST: 14 U/L (ref 0–37)
BILIRUBIN TOTAL: 0.8 mg/dL (ref 0.2–1.2)
BUN: 22 mg/dL (ref 6–23)
CALCIUM: 10.2 mg/dL (ref 8.4–10.5)
CO2: 29 mEq/L (ref 19–32)
CREATININE: 1.49 mg/dL — AB (ref 0.40–1.20)
Chloride: 102 mEq/L (ref 96–112)
GFR: 36.29 mL/min — ABNORMAL LOW (ref >60.00)
Glucose, Bld: 130 mg/dL — ABNORMAL HIGH (ref 70–99)
Potassium: 4.3 mEq/L (ref 3.5–5.1)
Sodium: 141 mEq/L (ref 135–145)
TOTAL PROTEIN: 7.4 g/dL (ref 6.0–8.3)

## 2015-12-20 MED ORDER — PREDNISONE 10 MG PO TABS: ORAL_TABLET | ORAL | 0 refills | Status: DC

## 2015-12-20 MED ORDER — DOXYCYCLINE HYCLATE 100 MG PO TABS: 100.0000 mg | ORAL_TABLET | Freq: Two times a day (BID) | ORAL | 0 refills | Status: DC

## 2015-12-20 MED ORDER — BENZONATATE 100 MG PO CAPS: 100.0000 mg | ORAL_CAPSULE | Freq: Three times a day (TID) | ORAL | 0 refills | Status: DC | PRN

## 2015-12-20 NOTE — Patient Instructions (Signed)
You appear to have bronchitis. Rest hydrate and tylenol for fever. I am prescribing cough medicine benzonate , and doxcycline antibiotic.   For wheezing will add tapered dose prednisone to your current inhalers and continue oxygen daily.  Will get labs to assess infection fighting cells and to see if any chf component.  Will get cxr to see if any pneumonia.  Follow up in 7-10 days or as needed

## 2015-12-20 NOTE — Progress Notes (Signed)
Pre visit review using our clinic review tool, if applicable. No additional management support is needed unless otherwise documented below in the visit note. 

## 2015-12-20 NOTE — Progress Notes (Signed)
   Subjective:    Patient ID: [name], female    DOB: 1940-08-06, 75 y.o.   MRN: [id number]  HPI   Pt in feeling sick for about 3 days. She states first day st and some coughing then productive cough. 2nd day felt little better. Then last night got severe cough that was very productive.   Pt also states off and on history of bilateral lower leg pain. Ankle to her knees. But worse than in the past. Some pain behind her knees. Pt states had some superficial phelebitis in the past. But no dvt hx.  Pt HR is usually on high side per pt in September pulse was 103.  Today pulse varied between 113 and 117.(pt took decongestant before she came in today)  Pt on o2 for 2-3 years. With o2 today 98%.  Pt has history of chronic bronchitis and asthma.   No body aches.     Review of Systems  Constitutional: Positive for chills. Negative for fatigue and fever.  HENT: Positive for congestion and sinus pain. Negative for sinus pressure.   Respiratory: Positive for cough and wheezing. Negative for shortness of breath.   Cardiovascular: Negative for chest pain and palpitations.  Gastrointestinal: Negative for abdominal pain, constipation, nausea and vomiting.  Musculoskeletal: Negative for back pain.       Leg pain.  Skin: Negative for rash.  Neurological: Negative for dizziness, numbness and headaches.  Hematological: Negative for adenopathy. Does not bruise/bleed easily.  Psychiatric/Behavioral: Negative for behavioral problems.       Objective:   Physical Exam  General  Mental Status - Alert. General Appearance - Well groomed. Not in acute distress.  Skin Rashes- No Rashes.  HEENT Head- Normal. Ear Auditory Canal - Left- Normal. Right - Normal.Tympanic Membrane- Left- Normal. Right- Normal. Eye Sclera/Conjunctiva- Left- Normal. Right- Normal. Nose & Sinuses Nasal Mucosa- Left-  Boggy and Congested. Right-  Boggy and  Congested.Bilateral maxillary and frontal sinus  pressure. Mouth & Throat Lips: Upper Lip- Normal: no dryness, cracking, pallor, cyanosis, or vesicular eruption. Lower Lip-Normal: no dryness, cracking, pallor, cyanosis or vesicular eruption. Buccal Mucosa- Bilateral- No Aphthous ulcers. Oropharynx- No Discharge or Erythema. Tonsils: Characteristics- Bilateral- No Erythema or Congestion. Size/Enlargement- Bilateral- No enlargement. Discharge- bilateral-None.  Neck Neck- Supple. No Masses. No jvd.   Chest and Lung Exam Auscultation: Breath Sounds:-even and unlabored. Shallow with faint expiratory wheezing.  Cardiovascular Auscultation:Rythm- Regular, rate and rhythm. Murmurs & Other Heart Sounds:Ausculatation of the heart reveal- No Murmurs.  Lymphatic Head & Neck General Head & Neck Lymphatics: Bilateral: Description- No Localized lymphadenopathy.  Lower ext- 1-2 + pedal edema bilaterally. Symmetric. Warm and tender distal tibia region. +homans sign both sides. No ulcers or breakdown seen       Assessment & Plan:  You appear to have bronchitis. Rest hydrate and tylenol for fever. I am prescribing cough medicine benzonate , and doxcycline antibiotic.   For wheezing will add tapered dose prednisone to your current inhalers and continue oxygen daily.  Will get labs to assess infection fighting cells and to see if any chf component.  Will get cxr to see if any pneumonia.  Follow up in 7-10 days or as needed  Abdallah Hern, Percell Miller, [employer]

## 2015-12-21 DIAGNOSIS — R0902 Hypoxemia: Secondary | ICD-10-CM | POA: Diagnosis not present

## 2015-12-21 DIAGNOSIS — J4520 Mild intermittent asthma, uncomplicated: Secondary | ICD-10-CM | POA: Diagnosis not present

## 2015-12-25 ENCOUNTER — Telehealth: Payer: Self-pay | Admitting: Family Medicine

## 2015-12-25 NOTE — Telephone Encounter (Signed)
I have not historically managed her CPAP, pulmonary does that, she should try and go through them for orders for supplies

## 2015-12-25 NOTE — Telephone Encounter (Signed)
Patient called stating that she needs Dr. Charlett Blake to order the tubing and cup for a nebulizer through [facility]. Please advise  Patient phone: [phone]

## 2015-12-26 NOTE — Telephone Encounter (Signed)
Sorry I am willing to do her neb tubing and supplies. Please give home health the order

## 2015-12-26 NOTE — Telephone Encounter (Signed)
You do prescribe her nebulizer med. Should she still go through pulmonary for neb supplies?

## 2015-12-27 NOTE — Telephone Encounter (Signed)
Called [employer] at [phone] and spoke to [place].   She will take care of and call patient to take care of this need.   She will let us know if we need to do anything.

## 2016-01-03 ENCOUNTER — Ambulatory Visit: Payer: Commercial Managed Care - HMO | Admitting: Medical

## 2016-01-07 ENCOUNTER — Ambulatory Visit (INDEPENDENT_AMBULATORY_CARE_PROVIDER_SITE_OTHER): Payer: Commercial Managed Care - HMO | Admitting: Family Medicine

## 2016-01-07 ENCOUNTER — Ambulatory Visit: Payer: Commercial Managed Care - HMO | Admitting: *Deleted

## 2016-01-07 ENCOUNTER — Encounter: Payer: Self-pay | Admitting: Family Medicine

## 2016-01-07 VITALS — BP 116/60 | HR 86 | Temp 97.8°F | Resp 18 | Ht 65.0 in | Wt 226.6 lb

## 2016-01-07 DIAGNOSIS — E782 Mixed hyperlipidemia: Secondary | ICD-10-CM | POA: Diagnosis not present

## 2016-01-07 DIAGNOSIS — I10 Essential (primary) hypertension: Secondary | ICD-10-CM

## 2016-01-07 DIAGNOSIS — Z0000 Encounter for general adult medical examination without abnormal findings: Secondary | ICD-10-CM | POA: Diagnosis not present

## 2016-01-07 DIAGNOSIS — E119 Type 2 diabetes mellitus without complications: Secondary | ICD-10-CM

## 2016-01-07 DIAGNOSIS — D649 Anemia, unspecified: Secondary | ICD-10-CM

## 2016-01-07 DIAGNOSIS — M353 Polymyalgia rheumatica: Secondary | ICD-10-CM | POA: Diagnosis not present

## 2016-01-07 HISTORY — DX: Polymyalgia rheumatica: M35.3

## 2016-01-07 MED ORDER — SERTRALINE HCL 100 MG PO TABS: 100.0000 mg | ORAL_TABLET | Freq: Every day | ORAL | 3 refills | Status: DC

## 2016-01-07 MED ORDER — CEFDINIR 300 MG PO CAPS: 300.0000 mg | ORAL_CAPSULE | Freq: Two times a day (BID) | ORAL | 0 refills | Status: DC

## 2016-01-07 MED ORDER — PREDNISONE 10 MG PO TABS: ORAL_TABLET | ORAL | 0 refills | Status: DC

## 2016-01-07 NOTE — Assessment & Plan Note (Signed)
No recent flares 

## 2016-01-07 NOTE — Progress Notes (Signed)
Subjective:   [name] is a 75 y.o. female who presents for Medicare Annual (Subsequent) preventive examination.  Review of Systems:  No ROS.  Medicare Wellness Visit.  Cardiac Risk Factors include: advanced age (>60men, >34 women);diabetes mellitus;dyslipidemia;hypertension;obesity (BMI >30kg/m2);sedentary lifestyle  Sleep patterns: no sleep issues. Often falls asleep in recliner, then gets up and goes to bed. Sleeps 5-7 hrs nightly. Gets up once to void.  Home Safety/Smoke Alarms: Feels safe in home. Smoke alarms in place.   Living environment; residence: Lives alone. 1-story house/ trailer, can live on one level, walk-in shower, equipment: Walkers, Type: [place]. Uses walker at home some and if she is going out shopping, does not have walker today. Seat Belt Safety/Bike Helmet: Wears seat belt.   Counseling:   Eye Exam- Due, appt upcoming. Dental- Follows w/ dentist every 6 months. Dentures upper.  Female:   Pap- N/A      Mammo- last 02/20/15, BI-RADS CATEGORY  1: Negative.        Dexa scan- last 07/03/14, low bone mass       CCS- last 11/09/13 w/ Dr. Delfin Edis. 2 polyps removed, mild diverticulosis, small internal hemorrhoids. 5 year recall.     Objective:     Vitals: BP 116/60 (BP Location: Left Arm, Patient Position: Sitting, Cuff Size: Large)   Pulse 86   Temp 97.8 F (36.6 C) (Oral)   Resp 18   Ht 5\' 5"  (1.651 m)   Wt 226 lb 9.6 oz (102.8 kg)   SpO2 96% Comment: O2  BMI 37.71 kg/m   Body mass index is 37.71 kg/m.   Tobacco History  Smoking Status  . Never Smoker  Smokeless Tobacco  . Never Used     Counseling given: Not Answered   Past Medical History:  Diagnosis Date  . Abnormal glucose tolerance test   . ACE-inhibitor cough   . Anemia 03/12/2014  . Arthritis   . Asthma    PFT 02/06/09 FEV1 1.41 (65%), FVC 1.92 (64), FEV1% 74, TLC 3.47 (71%), DLCO 48%, +BD  . Atypical chest pain    s/p cath, Normal coronaries, Non ST elevation  myocardial infarction, Rt groin pseudoaneurysm  . Bacterial vaginosis 03/12/2014  . COPD (chronic obstructive pulmonary disease) ([place])   . Depression   . DVT (deep venous thrombosis) ([place]) 1987  . Fall at home 12/04/2014  . GERD (gastroesophageal reflux disease)   . Gout   . Hypercalcemia 10/15/2014  . Hyperlipidemia   . Hypertension   . Hypoxia 10/15/2014  . Macular degeneration 04/10/2015  . Osteopenia 12/29/2006   Qualifier: Diagnosis of  By: Wynona Luna   . Pneumonia   . 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 Sun-damaged skin 10/24/2012  . Unspecified constipation 06/05/2013  . Urinary frequency 10/24/2012  . UTI (lower urinary tract infection) 10/24/2012  . Vitamin D deficiency 01/01/2015   Past Surgical History:  Procedure Laterality Date  . APPENDECTOMY  1951  . TONSILLECTOMY  1950  . TUBAL LIGATION  1968   Family History  Problem Relation Age of Onset  . Asthma Sister   . Arthritis Sister   . Hypertension Sister   . Hyperlipidemia Sister   . Cancer Sister 11    uterine  . Uterine cancer Sister   . Coronary artery disease Brother     x2  . Cancer Brother     lung, smoker  . Arthritis Brother   . Hypertension Sister   . Arthritis Sister   .  Hyperlipidemia Sister   . Emphysema Sister   . COPD Sister     smoker  . Heart disease Sister   . Hyperlipidemia Sister   . Hypertension Sister   . Arthritis Sister   . Emphysema Brother   . Heart disease Brother     MI smoker  . Mental illness Father   . Alcohol abuse Father   . Heart disease Mother     MI  . Hyperlipidemia Mother   . Epilepsy Daughter   . Hypertension Daughter   . Obesity Daughter   . COPD Brother     smoker  . Cancer Brother     lung  . Coronary artery disease Other   . Colon polyps Sister    History  Sexual Activity  . Sexual activity: No    Comment: lives alone, no dietary restrictions    Outpatient Encounter Prescriptions as of 01/07/2016  Medication Sig  . acetaminophen  (TYLENOL) 500 MG tablet Take 500 mg by mouth every 6 (six) hours as needed.  Marland Kitchen albuterol (ACCUNEB) 0.63 MG/3ML nebulizer solution USE ONE VIAL IN NEBULIZER EVERY 6 HOURS AS NEEDED  . albuterol (PROAIR HFA) 108 (90 Base) MCG/ACT inhaler Inhale 2 puffs into the lungs every 4 (four) hours as needed.  Marland Kitchen allopurinol (ZYLOPRIM) 100 MG tablet Take 2 tablets (200 mg total) by mouth daily.  Marland Kitchen aspirin EC 81 MG tablet Take 1 tablet (81 mg total) by mouth daily.  Marland Kitchen b complex vitamins tablet Take 1 tablet by mouth daily.  . benzonatate (TESSALON) 100 MG capsule Take 1 capsule (100 mg total) by mouth 3 (three) times daily as needed for cough.  . budesonide-formoterol (SYMBICORT) 160-4.5 MCG/ACT inhaler INHALE 2 PUFFS INTO THE LUNGS 2 TIMES DAILY  . Cyanocobalamin (VITAMIN B-12 PO) Take by mouth daily.  . Ferrous Fumarate-Folic Acid (HEMOCYTE-F) 324-1 MG TABS Take 1 tablet by mouth daily.  . fluticasone (FLONASE) 50 MCG/ACT nasal spray Place 2 sprays into both nostrils daily.  . furosemide (LASIX) 40 MG tablet TAKE 1/2 TABLET EVERY OTHER DAY  . glucose blood (ONE TOUCH TEST STRIPS) test strip Use as instructed to check blood sugar once weekly. DX 790.29   . losartan (COZAAR) 25 MG tablet TAKE 1 TABLET EVERY DAY  . metoprolol succinate (TOPROL-XL) 25 MG 24 hr tablet Take 1 tablet (25 mg total) by mouth daily.  . montelukast (SINGULAIR) 10 MG tablet Take 1 tablet (10 mg total) by mouth at bedtime as needed.  . Niacin (VITAMIN B-3 PO) Take 1 tablet by mouth daily.  . OXYGEN Inhale into the lungs.  . pantoprazole (PROTONIX) 40 MG tablet TAKE 1 TABLET TWICE DAILY  . potassium chloride SA (K-DUR,KLOR-CON) 20 MEQ tablet TAKE 1 TABLET EVERY DAY  . pravastatin (PRAVACHOL) 20 MG tablet TAKE 1 TABLET EVERY DAY  . predniSONE (DELTASONE) 10 MG tablet 5 tab po x qd x 2 day , 4 tab po qd x 3 day, 3 tab po day 3, 2 tab po qd x 3 days, 1 tab po qd x 3 days, then 1/2 tabs po qd x 4 days  . Probiotic Product (PROBIOTIC PO) Take  by mouth daily.  . ranitidine (ZANTAC) 300 MG tablet TAKE 1 TABLET AT BEDTIME  . tiotropium (SPIRIVA) 18 MCG inhalation capsule Place 1 capsule (18 mcg total) into inhaler and inhale daily.  . traMADol (ULTRAM) 50 MG tablet TAKE ONE TABLET BY MOUTH THREE TIMES DAILY AS NEEDED  . [DISCONTINUED] cephALEXin (KEFLEX) 500 MG capsule  Take 1 capsule (500 mg total) by mouth 3 (three) times daily.  . [DISCONTINUED] doxycycline (VIBRA-TABS) 100 MG tablet Take 1 tablet (100 mg total) by mouth 2 (two) times daily. Can give caps or generic  . [DISCONTINUED] predniSONE (DELTASONE) 10 MG tablet 5 tab po day 1, 4 tab po day 2, 3 tab po day 3, 2 tab po day 4, 1 tab po day 5  . cefdinir (OMNICEF) 300 MG capsule Take 1 capsule (300 mg total) by mouth 2 (two) times daily.  . sertraline (ZOLOFT) 100 MG tablet Take 1 tablet (100 mg total) by mouth daily.  . [DISCONTINUED] sertraline (ZOLOFT) 50 MG tablet Take 1 tablet (50 mg total) by mouth daily. (Patient not taking: Reported on 01/07/2016)   No facility-administered encounter medications on file as of 01/07/2016.     Activities of Daily Living In your present state of health, do you have any difficulty performing the following activities: 01/07/2016 09/25/2015  Hearing? Y N  Vision? N N  Difficulty concentrating or making decisions? N N  Walking or climbing stairs? Y Y  Dressing or bathing? N N  Doing errands, shopping? N N  Preparing Food and eating ? N -  Using the Toilet? N -  In the past six months, have you accidently leaked urine? Y -  Do you have problems with loss of bowel control? N -  Managing your Medications? N -  Managing your Finances? N -  Housekeeping or managing your Housekeeping? N -  Some recent data might be hidden    Patient Care Team: Mosie Lukes, MD as PCP - General (Family Medicine) Elsie Stain, MD as Consulting Physician (Pulmonary Disease) Calton Dach, MD as Referring Physician (Optometry) Melvenia Needles, NP as  Nurse Practitioner (Pulmonary Disease)    Assessment:    Physical assessment deferred to PCP.  Exercise Activities and Dietary recommendations Current Exercise Habits: The patient does not participate in regular exercise at present, Exercise limited by: orthopedic condition(s);respiratory conditions(s)  Diet (meal preparation, eat out, water intake, caffeinated beverages, dairy products, fruits and vegetables): on average, 3 meals per day. Does not cook much. Usually eats a sandwich. Trying to increase vegetables and less bread. Drinks Dr. Malachi Bonds and lots of water. Drinks lots of OJ, but is trying to cut back on sugary drinks.       Goals    . Eat more fruits and vegetables    . Go to bed at the same time every night.      Fall Risk Fall Risk  01/07/2016 09/25/2015 09/25/2015 09/25/2015 06/29/2014  Falls in the past year? Yes Yes - - No  Number falls in past yr: 1 1 - 1 -  Injury with Fall? - Yes Yes - -  Follow up Falls prevention discussed - - - -   Depression Screen PHQ 2/9 Scores 01/07/2016 09/25/2015 09/25/2015 09/25/2015  PHQ - 2 Score 2 0 1 1  PHQ- 9 Score 8 - - -     Cognitive Function MMSE - Mini Mental State Exam 01/07/2016  Orientation to time 4  Orientation to Place 5  Registration 3  Attention/ Calculation 5  Recall 3  Language- name 2 objects 2  Language- repeat 1  Language- follow 3 step command 3  Language- read & follow direction 1  Write a sentence 1  Copy design 0  Total score 28        Immunization History  Administered Date(s) Administered  . Influenza  Split 12/08/2011  . Influenza Whole 10/20/2006, 11/09/2007, 11/14/2008, 10/30/2009  . Influenza, High Dose Seasonal PF 09/25/2015  . Influenza,inj,Quad PF,36+ Mos 10/20/2012, 11/28/2013, 10/02/2014  . Pneumococcal Conjugate-13 02/21/2013  . Pneumococcal Polysaccharide-23 06/30/2006  . Td 06/01/2007   Screening Tests Health Maintenance  Topic Date Due  . OPHTHALMOLOGY EXAM  10/07/2016 (Originally  10/08/2015)  . HEMOGLOBIN A1C  02/03/2016  . FOOT EXAM  04/09/2016  . MAMMOGRAM  02/19/2017  . TETANUS/TDAP  05/31/2017  . COLONOSCOPY  11/10/2018  . INFLUENZA VACCINE  Completed  . DEXA SCAN  Completed  . ZOSTAVAX  Completed  . PNA vac Low Risk Adult  Completed      Plan:    Follow-up w/ Dr. Charlett Blake as scheduled. Bring a copy of your advance directives to your next office visit.  During the course of the visit the patient was educated and counseled about the following appropriate screening and preventive services:   Vaccines to include Pneumoccal, Influenza, Hepatitis B, Td, Zostavax, HCV  Cardiovascular Disease  Colorectal cancer screening  Bone density screening  Diabetes screening  Glaucoma screening  Mammography  Nutrition counseling   Patient Instructions (the written plan) was given to the patient.   Dorrene German, RN  01/07/2016

## 2016-01-07 NOTE — Assessment & Plan Note (Signed)
hgba1c acceptable, minimize simple carbs. Increase exercise as tolerated. Continue current meds 

## 2016-01-07 NOTE — Progress Notes (Signed)
Pre visit review using our clinic review tool, if applicable. No additional management support is needed unless otherwise documented below in the visit note. 

## 2016-01-07 NOTE — Assessment & Plan Note (Signed)
Well controlled, no changes to meds. Encouraged heart healthy diet such as the DASH diet and exercise as tolerated.  °

## 2016-01-07 NOTE — Assessment & Plan Note (Signed)
Encouraged to get adequate exercise, calcium and vitamin d intake 

## 2016-01-07 NOTE — Assessment & Plan Note (Signed)
Recent increased leg pain responded to steroids will refill and if pain recurs will need refer

## 2016-01-07 NOTE — Progress Notes (Signed)
Patient ID: [name], female   DOB: 1940-03-01, 75 y.o.   MRN: [id number]

## 2016-01-07 NOTE — Assessment & Plan Note (Signed)
RRR 

## 2016-01-07 NOTE — Assessment & Plan Note (Signed)
Tolerating statin, encouraged heart healthy diet, avoid trans fats, minimize simple carbs and saturated fats. Increase exercise as tolerated 

## 2016-01-07 NOTE — Patient Instructions (Addendum)
Start taking Zoloft 100mg  daily.                                            Polymyalgia Rheumatica Introduction Polymyalgia rheumatica (PMR) is an inflammatory disorder that causes aching and stiffness in your muscles and joints. Sometimes, PMR leads to a more dangerous condition (temporal arteritis or giant cell arteritis), which can cause vision loss. What are the causes? The exact cause of PMR is not known. What increases the risk? This condition is more likely to develop in:  Females.  People who are 60 years of age or older.  Caucasians. What are the signs or symptoms?   Pain and stiffness are the main symptoms of PMR. Symptoms may start slowly or suddenly. The symptoms:  May be worse after inactivity and in the morning.  May affect your:  Hips, buttocks, and thighs.  Neck, arms, and shoulders. This can make it hard to raise your arms above your head.  Hands and wrists. Other symptoms include:  Fever.  Tiredness.  Weakness.  Decreased appetite. This may lead to weight loss. How is this diagnosed? This condition is diagnosed with a medical history and physical exam. You may need to see a health care provider who specializes in diseases of the joint, muscles, and bones (rheumatologist). You may also have tests, including:  Blood tests.  X-rays. How is this treated? PMR usually goes away without treatment, but it may take years for that to happen. In the meantime, your health care provider may recommend low-dose steroids to help manage your symptoms of pain and stiffness. Regular exercise and rest will also help your symptoms. Follow these instructions at home:  Take over-the-counter and prescription medicines only as told by your health care provider.  Make sure to get enough rest and sleep.  Eat a healthy and nutritious diet.  Try to exercise most days of the week. Ask your health care  provider what type of exercise is best for you.  Keep all follow-up visits as told by your health are provider. This is important. Contact a health care provider if:  Your symptoms are not controlled with medicine.  You have side effects from steroids. These may include:  Weight gain.  Swelling.  Insomnia.  Mood changes.  Bruising.  High blood sugar readings, if you have diabetes.  Higher than normal blood pressure readings, if you monitor your blood pressure. Get help right away if:  You develop symptoms of temporal arteritis, such as:  A change in vision.  Severe headache.  Scalp pain.  Jaw pain. This information is not intended to replace advice given to you by your health care provider. Make sure you discuss any questions you have with your health care provider. Document Released: 02/14/2004 Document Revised: 06/14/2015 Document Reviewed: 07/19/2014  2017 [employer]vier     Start Zoloft 100mg  daily.

## 2016-01-07 NOTE — Progress Notes (Signed)
Subjective:    Patient ID: [name], female    DOB: 1940-03-07, 75 y.o.   MRN: [id number]  No chief complaint on file.   HPI Patient is in today for follbow up. Complaining of knee pain and little energy. She denies any acute febrile illness or recent hospitalizations. Denies CP/palp/SOB/HA/congestion/fevers/GI or GU c/o. Taking meds as prescribed  Past Medical History:  Diagnosis Date  . Abnormal glucose tolerance test   . ACE-inhibitor cough   . Anemia 03/12/2014  . Arthritis   . Asthma    PFT 02/06/09 FEV1 1.41 (65%), FVC 1.92 (64), FEV1% 74, TLC 3.47 (71%), DLCO 48%, +BD  . Atypical chest pain    s/p cath, Normal coronaries, Non ST elevation myocardial infarction, Rt groin pseudoaneurysm  . Bacterial vaginosis 03/12/2014  . COPD (chronic obstructive pulmonary disease) ([place])   . Depression   . DVT (deep venous thrombosis) ([place]) 1987  . Fall at home 12/04/2014  . GERD (gastroesophageal reflux disease)   . Gout   . Hypercalcemia 10/15/2014  . Hyperlipidemia   . Hypertension   . Hypoxia 10/15/2014  . Macular degeneration 04/10/2015  . Osteopenia 12/29/2006   Qualifier: Diagnosis of  By: Wynona Luna   . Pneumonia   . Sun-damaged skin 10/24/2012  . Unspecified constipation 06/05/2013  . Urinary frequency 10/24/2012  . UTI (lower urinary tract infection) 10/24/2012  . Vitamin D deficiency 01/01/2015    Past Surgical History:  Procedure Laterality Date  . APPENDECTOMY  1951  . TONSILLECTOMY  1950  . TUBAL LIGATION  1968    Family History  Problem Relation Age of Onset  . Asthma Sister   . Arthritis Sister   . Hypertension Sister   . Hyperlipidemia Sister   . Cancer Sister 62    uterine  . Uterine cancer Sister   . Coronary artery disease Brother     x2  . Cancer Brother     lung, smoker  . Arthritis Brother   . Coronary artery disease Other   . Hypertension Sister   . Arthritis Sister   . Hyperlipidemia Sister   . Emphysema Sister   . COPD Sister     smoker  . Heart disease Sister   . Hyperlipidemia Sister   . Hypertension Sister   . Arthritis Sister   . Emphysema Brother   . Heart disease Brother     MI smoker  . Colon polyps Sister   . Mental illness Father   . Alcohol abuse Father   . Heart disease Mother     MI  . Hyperlipidemia Mother   . Epilepsy Daughter   . Hypertension Daughter   . Obesity Daughter   . COPD Brother     smoker  . Cancer Brother     lung    Social History   Social History  . Marital status: Widowed    Spouse name: N/A  . Number of children: 1  . Years of education: N/A   Occupational History  . Retired [employer]   Social History Main Topics  . Smoking status: Never Smoker  . Smokeless tobacco: Never Used  . Alcohol use No  . Drug use: No  . Sexual activity: No     Comment: lives alone, no dietary restrictions   Other Topics Concern  . Not on file   Social History Narrative  . No narrative on file    Outpatient Medications Prior to Visit  Medication Sig Dispense Refill  .  acetaminophen (TYLENOL) 500 MG tablet Take 500 mg by mouth every 6 (six) hours as needed.    Marland Kitchen albuterol (ACCUNEB) 0.63 MG/3ML nebulizer solution USE ONE VIAL IN NEBULIZER EVERY 6 HOURS AS NEEDED 150 mL 1  . albuterol (PROAIR HFA) 108 (90 Base) MCG/ACT inhaler Inhale 2 puffs into the lungs every 4 (four) hours as needed. 3 Inhaler 1  . allopurinol (ZYLOPRIM) 100 MG tablet Take 2 tablets (200 mg total) by mouth daily. 180 tablet 1  . aspirin EC 81 MG tablet Take 1 tablet (81 mg total) by mouth daily.    Marland Kitchen b complex vitamins tablet Take 1 tablet by mouth daily.    . benzonatate (TESSALON) 100 MG capsule Take 1 capsule (100 mg total) by mouth 3 (three) times daily as needed for cough. 21 capsule 0  . budesonide-formoterol (SYMBICORT) 160-4.5 MCG/ACT inhaler INHALE 2 PUFFS INTO THE LUNGS 2 TIMES DAILY 3 Inhaler 1  . cephALEXin (KEFLEX) 500 MG capsule Take 1 capsule (500 mg total) by mouth 3 (three) times daily. 30  capsule 0  . Cyanocobalamin (VITAMIN B-12 PO) Take by mouth daily.    Marland Kitchen doxycycline (VIBRA-TABS) 100 MG tablet Take 1 tablet (100 mg total) by mouth 2 (two) times daily. Can give caps or generic 20 tablet 0  . Ferrous Fumarate-Folic Acid (HEMOCYTE-F) 324-1 MG TABS Take 1 tablet by mouth daily. 30 each 2  . fluticasone (FLONASE) 50 MCG/ACT nasal spray Place 2 sprays into both nostrils daily. 16 g 6  . furosemide (LASIX) 40 MG tablet TAKE 1/2 TABLET EVERY OTHER DAY 23 tablet 6  . glucose blood (ONE TOUCH TEST STRIPS) test strip Use as instructed to check blood sugar once weekly. DX 790.29     . losartan (COZAAR) 25 MG tablet TAKE 1 TABLET EVERY DAY 90 tablet 1  . metoprolol succinate (TOPROL-XL) 25 MG 24 hr tablet Take 1 tablet (25 mg total) by mouth daily. 90 tablet 1  . montelukast (SINGULAIR) 10 MG tablet Take 1 tablet (10 mg total) by mouth at bedtime as needed. 30 tablet 3  . Niacin (VITAMIN B-3 PO) Take 1 tablet by mouth daily.    . OXYGEN Inhale into the lungs.    . pantoprazole (PROTONIX) 40 MG tablet TAKE 1 TABLET TWICE DAILY 180 tablet 1  . potassium chloride SA (K-DUR,KLOR-CON) 20 MEQ tablet TAKE 1 TABLET EVERY DAY 90 tablet 2  . pravastatin (PRAVACHOL) 20 MG tablet TAKE 1 TABLET EVERY DAY 90 tablet 1  . predniSONE (DELTASONE) 10 MG tablet 5 tab po day 1, 4 tab po day 2, 3 tab po day 3, 2 tab po day 4, 1 tab po day 5 15 tablet 0  . Probiotic Product (PROBIOTIC PO) Take by mouth daily.    . ranitidine (ZANTAC) 300 MG tablet TAKE 1 TABLET AT BEDTIME 90 tablet 3  . sertraline (ZOLOFT) 50 MG tablet Take 1 tablet (50 mg total) by mouth daily. 90 tablet 3  . tiotropium (SPIRIVA) 18 MCG inhalation capsule Place 1 capsule (18 mcg total) into inhaler and inhale daily. 90 capsule 1  . traMADol (ULTRAM) 50 MG tablet TAKE ONE TABLET BY MOUTH THREE TIMES DAILY AS NEEDED 90 tablet 0   No facility-administered medications prior to visit.     Allergies  Allergen Reactions  . Oseltamivir Phosphate      REACTION: nausea, vomiting, diarrhea, dizziness  . Sulfa Antibiotics Other (See Comments)    CP  . Ace Inhibitors   . Indomethacin   .  Montelukast Sodium     REACTION: Heart palpitations, chest pain  . Sulfonamide Derivatives     Review of Systems  Constitutional: Positive for malaise/fatigue. Negative for fever.  Eyes: Negative for blurred vision.  Respiratory: Negative for cough and shortness of breath.   Cardiovascular: Negative for chest pain.  Gastrointestinal: Negative for vomiting.  Musculoskeletal: Positive for joint pain. Negative for back pain.  Skin: Negative for rash.  Neurological: Negative for loss of consciousness and headaches.       Objective:    Physical Exam  Constitutional: She is oriented to person, place, and time. She appears well-developed and well-nourished. No distress.  HENT:  Head: Normocephalic and atraumatic.  Eyes: Conjunctivae are normal.  Neck: Normal range of motion. No thyromegaly present.  Cardiovascular: Normal rate and regular rhythm.   Pulmonary/Chest: Effort normal and breath sounds normal. She has no wheezes.  Abdominal: Soft. Bowel sounds are normal. There is no tenderness.  Musculoskeletal: She exhibits no edema or deformity.  Neurological: She is alert and oriented to person, place, and time.  Skin: Skin is warm and dry. She is not diaphoretic.  Psychiatric: She has a normal mood and affect.    BP 116/60 (BP Location: Left Arm, Patient Position: Sitting, Cuff Size: Large)   Pulse 86   Temp 97.8 F (36.6 C) (Oral)   Wt 226 lb 9.6 oz (102.8 kg)   SpO2 96% Comment: O2  BMI 37.71 kg/m  Wt Readings from Last 3 Encounters:  01/07/16 226 lb 9.6 oz (102.8 kg)  12/20/15 227 lb 9.6 oz (103.2 kg)  10/08/15 231 lb 9.6 oz (105.1 kg)     Lab Results  Component Value Date   WBC 11.0 (H) 12/20/2015   HGB 11.6 (L) 12/20/2015   HCT 35.5 (L) 12/20/2015   PLT 152.0 12/20/2015   GLUCOSE 130 (H) 12/20/2015   CHOL 148 08/03/2015     TRIG 147.0 08/03/2015   HDL 43.30 08/03/2015   LDLDIRECT 142.7 12/16/2005   LDLCALC 76 08/03/2015   ALT 11 12/20/2015   AST 14 12/20/2015   NA 141 12/20/2015   K 4.3 12/20/2015   CL 102 12/20/2015   CREATININE 1.49 (H) 12/20/2015   BUN 22 12/20/2015   CO2 29 12/20/2015   TSH 2.74 08/03/2015   INR 1.0 09/12/2006   HGBA1C 5.7 08/03/2015   MICROALBUR 0.8 02/28/2014    Lab Results  Component Value Date   TSH 2.74 08/03/2015   Lab Results  Component Value Date   WBC 11.0 (H) 12/20/2015   HGB 11.6 (L) 12/20/2015   HCT 35.5 (L) 12/20/2015   MCV 94.1 12/20/2015   PLT 152.0 12/20/2015   Lab Results  Component Value Date   NA 141 12/20/2015   K 4.3 12/20/2015   CO2 29 12/20/2015   GLUCOSE 130 (H) 12/20/2015   BUN 22 12/20/2015   CREATININE 1.49 (H) 12/20/2015   BILITOT 0.8 12/20/2015   ALKPHOS 52 12/20/2015   AST 14 12/20/2015   ALT 11 12/20/2015   PROT 7.4 12/20/2015   ALBUMIN 4.2 12/20/2015   CALCIUM 10.2 12/20/2015   ANIONGAP 10 01/12/2014   GFR 36.29 (L) 12/20/2015   Lab Results  Component Value Date   CHOL 148 08/03/2015   Lab Results  Component Value Date   HDL 43.30 08/03/2015   Lab Results  Component Value Date   LDLCALC 76 08/03/2015   Lab Results  Component Value Date   TRIG 147.0 08/03/2015   Lab Results  Component  Value Date   CHOLHDL 3 08/03/2015   Lab Results  Component Value Date   HGBA1C 5.7 08/03/2015      I acted as a Education administrator for Dr. Charlett Blake. [name] Noble, CMA  Assessment & Plan:   Problem List Items Addressed This Visit    None      I am having Ms. Kattner maintain her glucose blood, acetaminophen, Niacin (VITAMIN B-3 PO), Cyanocobalamin (VITAMIN B-12 PO), OXYGEN, fluticasone, furosemide, b complex vitamins, Probiotic Product (PROBIOTIC PO), Ferrous Fumarate-Folic Acid, tiotropium, albuterol, budesonide-formoterol, ranitidine, allopurinol, metoprolol succinate, sertraline, cephALEXin, aspirin EC, albuterol, pantoprazole,  montelukast, losartan, pravastatin, traMADol, potassium chloride SA, doxycycline, benzonatate, and predniSONE.  No orders of the defined types were placed in this encounter.    Shan Levans, CMA

## 2016-01-08 ENCOUNTER — Other Ambulatory Visit: Payer: Self-pay | Admitting: Family Medicine

## 2016-01-08 LAB — COMPREHENSIVE METABOLIC PANEL
ALT: 9 U/L (ref 0–35)
AST: 13 U/L (ref 0–37)
Albumin: 3.7 g/dL (ref 3.5–5.2)
Alkaline Phosphatase: 46 U/L (ref 39–117)
BUN: 27 mg/dL — ABNORMAL HIGH (ref 6–23)
CALCIUM: 10 mg/dL (ref 8.4–10.5)
CHLORIDE: 103 meq/L (ref 96–112)
CO2: 33 meq/L — AB (ref 19–32)
Creatinine, Ser: 1.48 mg/dL — ABNORMAL HIGH (ref 0.40–1.20)
GFR: 36.57 mL/min — AB (ref >60.00)
GLUCOSE: 93 mg/dL (ref 70–99)
POTASSIUM: 5 meq/L (ref 3.5–5.1)
Sodium: 142 mEq/L (ref 135–145)
Total Bilirubin: 0.5 mg/dL (ref 0.2–1.2)
Total Protein: 6.6 g/dL (ref 6.0–8.3)

## 2016-01-08 LAB — LIPID PANEL
CHOL/HDL RATIO: 4
Cholesterol: 148 mg/dL (ref 0–200)
HDL: 39.7 mg/dL (ref >39.00)
LDL Cholesterol: 81 mg/dL (ref 0–99)
NONHDL: 108.51
TRIGLYCERIDES: 138 mg/dL (ref 0.0–149.0)
VLDL: 27.6 mg/dL (ref 0.0–40.0)

## 2016-01-08 LAB — CBC
HEMATOCRIT: 33.8 % — AB (ref 36.0–46.0)
Hemoglobin: 10.9 g/dL — ABNORMAL LOW (ref 12.0–15.0)
MCHC: 32.3 g/dL (ref 30.0–36.0)
MCV: 95.4 fl (ref 78.0–100.0)
Platelets: 144 10*3/uL — ABNORMAL LOW (ref 150.0–400.0)
RBC: 3.54 Mil/uL — AB (ref 3.87–5.11)
RDW: 14.8 % (ref 11.5–15.5)
WBC: 7.5 10*3/uL (ref 4.0–10.5)

## 2016-01-08 LAB — URIC ACID: Uric Acid, Serum: 6.2 mg/dL (ref 2.4–7.0)

## 2016-01-08 LAB — TSH: TSH: 2.6 u[IU]/mL (ref 0.35–4.50)

## 2016-01-08 LAB — SEDIMENTATION RATE: SED RATE: 31 mm/h — AB (ref 0–30)

## 2016-01-08 LAB — HEMOGLOBIN A1C: Hgb A1c MFr Bld: 6.2 % (ref 4.6–6.5)

## 2016-01-08 MED ORDER — FERROUS FUMARATE-FOLIC ACID 324-1 MG PO TABS: 1.0000 | ORAL_TABLET | Freq: Every day | ORAL | 2 refills | Status: DC

## 2016-01-08 MED ORDER — CEFDINIR 300 MG PO CAPS: 300.0000 mg | ORAL_CAPSULE | Freq: Two times a day (BID) | ORAL | 0 refills | Status: DC

## 2016-01-08 MED ORDER — PREDNISONE 10 MG PO TABS: ORAL_TABLET | ORAL | 0 refills | Status: DC

## 2016-01-15 ENCOUNTER — Other Ambulatory Visit: Payer: Self-pay | Admitting: Family Medicine

## 2016-01-15 NOTE — Telephone Encounter (Signed)
Rx faxed to [facility].

## 2016-01-15 NOTE — Telephone Encounter (Signed)
Medication is prescribed by PCP regularly, okay #90, no refills

## 2016-01-15 NOTE — Telephone Encounter (Signed)
Pt is requesting refill on Tramadol. Dr. Frederik Pear Pt.  Last OV: 01/07/2016 Last Fill: 11/29/2015 #90 and 0RF (Pt sig: 1 tab TID PRN) UDS: None  Please advise.

## 2016-01-15 NOTE — Telephone Encounter (Signed)
Rx printed, awaiting MD signature.  

## 2016-01-21 DIAGNOSIS — J4520 Mild intermittent asthma, uncomplicated: Secondary | ICD-10-CM | POA: Diagnosis not present

## 2016-01-21 DIAGNOSIS — R0902 Hypoxemia: Secondary | ICD-10-CM | POA: Diagnosis not present

## 2016-01-23 NOTE — Assessment & Plan Note (Signed)
Increase leafy greens, consider increased lean red meat and using cast iron cookware. Continue to monitor, report any concerns 

## 2016-01-25 ENCOUNTER — Other Ambulatory Visit: Payer: Self-pay | Admitting: Family Medicine

## 2016-02-11 ENCOUNTER — Other Ambulatory Visit: Payer: Self-pay | Admitting: Family Medicine

## 2016-02-11 ENCOUNTER — Telehealth: Payer: Self-pay | Admitting: Family Medicine

## 2016-02-11 DIAGNOSIS — M2550 Pain in unspecified joint: Secondary | ICD-10-CM

## 2016-02-11 NOTE — Telephone Encounter (Signed)
ordered

## 2016-02-11 NOTE — Telephone Encounter (Signed)
Relation to WA:QLRJ Call back number:[phone]   Reason for call:  Patient requesting a referral to rheumatologist, patient states symptoms have not improved, please advise.

## 2016-02-13 ENCOUNTER — Telehealth: Payer: Self-pay | Admitting: Family Medicine

## 2016-02-13 NOTE — Telephone Encounter (Signed)
Patient called requesting an appointment stating that every time she stands up or gets up her oxygen drops into the 80s. She states she still has oxygen on when this is happening. Sent to [facility].

## 2016-02-14 ENCOUNTER — Emergency Department (HOSPITAL_BASED_OUTPATIENT_CLINIC_OR_DEPARTMENT_OTHER): Payer: Medicare HMO

## 2016-02-14 ENCOUNTER — Emergency Department (HOSPITAL_BASED_OUTPATIENT_CLINIC_OR_DEPARTMENT_OTHER)
Admission: EM | Admit: 2016-02-14 | Discharge: 2016-02-14 | Disposition: A | Discharge status: Home / Self Care | Payer: Medicare HMO | Attending: Emergency Medicine | Admitting: Emergency Medicine

## 2016-02-14 ENCOUNTER — Other Ambulatory Visit: Payer: Self-pay | Admitting: Family Medicine

## 2016-02-14 ENCOUNTER — Encounter (HOSPITAL_BASED_OUTPATIENT_CLINIC_OR_DEPARTMENT_OTHER): Payer: Self-pay | Admitting: *Deleted

## 2016-02-14 DIAGNOSIS — I10 Essential (primary) hypertension: Secondary | ICD-10-CM | POA: Insufficient documentation

## 2016-02-14 DIAGNOSIS — Z7982 Long term (current) use of aspirin: Secondary | ICD-10-CM | POA: Insufficient documentation

## 2016-02-14 DIAGNOSIS — J441 Chronic obstructive pulmonary disease with (acute) exacerbation: Secondary | ICD-10-CM

## 2016-02-14 DIAGNOSIS — R069 Unspecified abnormalities of breathing: Secondary | ICD-10-CM | POA: Diagnosis not present

## 2016-02-14 DIAGNOSIS — J45909 Unspecified asthma, uncomplicated: Secondary | ICD-10-CM

## 2016-02-14 DIAGNOSIS — Z794 Long term (current) use of insulin: Secondary | ICD-10-CM | POA: Insufficient documentation

## 2016-02-14 DIAGNOSIS — Z79899 Other long term (current) drug therapy: Secondary | ICD-10-CM | POA: Diagnosis not present

## 2016-02-14 DIAGNOSIS — R0602 Shortness of breath: Secondary | ICD-10-CM | POA: Diagnosis not present

## 2016-02-14 DIAGNOSIS — E119 Type 2 diabetes mellitus without complications: Secondary | ICD-10-CM | POA: Insufficient documentation

## 2016-02-14 LAB — CBC WITH DIFFERENTIAL/PLATELET
Basophils Absolute: 0 10*3/uL (ref 0.0–0.1)
Basophils Relative: 0 %
EOS PCT: 9 %
Eosinophils Absolute: 0.6 10*3/uL (ref 0.0–0.7)
HCT: 32.6 % — ABNORMAL LOW (ref 36.0–46.0)
Hemoglobin: 10.2 g/dL — ABNORMAL LOW (ref 12.0–15.0)
LYMPHS ABS: 0.8 10*3/uL (ref 0.7–4.0)
LYMPHS PCT: 12 %
MCH: 30.7 pg (ref 26.0–34.0)
MCHC: 31.3 g/dL (ref 30.0–36.0)
MCV: 98.2 fL (ref 78.0–100.0)
Monocytes Absolute: 0.8 10*3/uL (ref 0.1–1.0)
Monocytes Relative: 13 %
Neutro Abs: 4.1 10*3/uL (ref 1.7–7.7)
Neutrophils Relative %: 66 %
PLATELETS: 184 10*3/uL (ref 150–400)
RBC: 3.32 MIL/uL — AB (ref 3.87–5.11)
RDW: 14 % (ref 11.5–15.5)
WBC: 6.3 10*3/uL (ref 4.0–10.5)

## 2016-02-14 LAB — COMPREHENSIVE METABOLIC PANEL
ALK PHOS: 53 U/L (ref 38–126)
ALT: 15 U/L (ref 14–54)
AST: 19 U/L (ref 15–41)
Albumin: 3.5 g/dL (ref 3.5–5.0)
Anion gap: 7 (ref 5–15)
BUN: 21 mg/dL — ABNORMAL HIGH (ref 6–20)
CALCIUM: 9.3 mg/dL (ref 8.9–10.3)
CHLORIDE: 102 mmol/L (ref 101–111)
CO2: 28 mmol/L (ref 22–32)
CREATININE: 1.46 mg/dL — AB (ref 0.44–1.00)
GFR, EST AFRICAN AMERICAN: 40 mL/min — AB (ref >60)
GFR, EST NON AFRICAN AMERICAN: 34 mL/min — AB (ref >60)
Glucose, Bld: 112 mg/dL — ABNORMAL HIGH (ref 65–99)
Potassium: 4.3 mmol/L (ref 3.5–5.1)
Sodium: 137 mmol/L (ref 135–145)
Total Bilirubin: 0.5 mg/dL (ref 0.3–1.2)
Total Protein: 6.9 g/dL (ref 6.5–8.1)

## 2016-02-14 MED ORDER — METHYLPREDNISOLONE SODIUM SUCC 125 MG IJ SOLR
125.0000 mg | Freq: Once | INTRAMUSCULAR | Status: AC
  Administered 2016-02-14: 125 mg via INTRAVENOUS
  Filled 2016-02-14: qty 2

## 2016-02-14 MED ORDER — IPRATROPIUM-ALBUTEROL 0.5-2.5 (3) MG/3ML IN SOLN
3.0000 mL | Freq: Once | RESPIRATORY_TRACT | Status: AC
  Administered 2016-02-14: 3 mL via RESPIRATORY_TRACT
  Filled 2016-02-14: qty 3

## 2016-02-14 MED ORDER — PREDNISONE 10 MG (21) PO TBPK: 10.0000 mg | ORAL_TABLET | Freq: Every day | ORAL | 0 refills | Status: DC

## 2016-02-14 MED ORDER — ALBUTEROL SULFATE (2.5 MG/3ML) 0.083% IN NEBU
2.5000 mg | INHALATION_SOLUTION | Freq: Once | RESPIRATORY_TRACT | Status: AC
  Administered 2016-02-14: 2.5 mg via RESPIRATORY_TRACT
  Filled 2016-02-14: qty 3

## 2016-02-14 MED ORDER — ALBUTEROL SULFATE (2.5 MG/3ML) 0.083% IN NEBU
5.0000 mg | INHALATION_SOLUTION | Freq: Once | RESPIRATORY_TRACT | Status: AC
  Administered 2016-02-14: 5 mg via RESPIRATORY_TRACT
  Filled 2016-02-14: qty 6

## 2016-02-14 MED ORDER — IPRATROPIUM BROMIDE 0.02 % IN SOLN
0.5000 mg | Freq: Once | RESPIRATORY_TRACT | Status: AC
  Administered 2016-02-14: 0.5 mg via RESPIRATORY_TRACT
  Filled 2016-02-14: qty 2.5

## 2016-02-14 MED FILL — predniSONE 10 MG TABS: 10 | 12 days supply | Qty: 42 | Fill #0

## 2016-02-14 NOTE — ED Provider Notes (Signed)
[facility] DEPT [facility] Provider Note   CSN: [id number] Arrival date & time: 02/14/16  1241     History   Chief Complaint Chief Complaint  Patient presents with  . Shortness of Breath    HPI [name] is a 76 y.o. female.  HPI  Pt with hx of COPD , HTN and multiple other medical problems presenting with shortness of breath.  She is on home O2 at 2L at all times.  She states over the past several days when she gets up to walk, her oxygen level will go down.  She feels weak and dizzy when this happens.  She has had some increased cough, coughing up more sputum than usual.  No fever/chills.  Denies chest pain.  No increase in baseline leg swelling.  She reports she was on antibiotics and steroids 3 weeks ago for bronchitis.  She feels she improved somewhat after that but is unsure if she got completely better.  There are no other associated systemic symptoms, there are no other alleviating or modifying factors.   Past Medical History:  Diagnosis Date  . Abnormal glucose tolerance test   . ACE-inhibitor cough   . Anemia 03/12/2014  . Arthritis   . Asthma    PFT 02/06/09 FEV1 1.41 (65%), FVC 1.92 (64), FEV1% 74, TLC 3.47 (71%), DLCO 48%, +BD  . Atypical chest pain    s/p cath, Normal coronaries, Non ST elevation myocardial infarction, Rt groin pseudoaneurysm  . Bacterial vaginosis 03/12/2014  . COPD (chronic obstructive pulmonary disease) ([place])   . Depression   . DVT (deep venous thrombosis) ([place]) 1987  . Fall at home 12/04/2014  . GERD (gastroesophageal reflux disease)   . Gout   . Hypercalcemia 10/15/2014  . Hyperlipidemia   . Hypertension   . Hypoxia 10/15/2014  . Macular degeneration 04/10/2015  . Osteopenia 12/29/2006   Qualifier: Diagnosis of  By: Wynona Luna   . Pneumonia   . Polymyalgia rheumatica ([place]) 01/07/2016  . Sun-damaged skin 10/24/2012  . Unspecified constipation 06/05/2013  . Urinary frequency 10/24/2012  . UTI (lower urinary tract infection) 10/24/2012   . Vitamin D deficiency 01/01/2015    Patient Active Problem List   Diagnosis Date Noted  . 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 Pedal edema 08/28/2015  . Chronic respiratory failure ([place]) 06/28/2015  . Macular degeneration 04/10/2015  . Fall at home 12/04/2014  . Hypercalcemia 10/15/2014  . Hypoxia 10/15/2014  . Anemia 03/12/2014  . Cervical cancer screening 02/28/2014  . Eustachian tube dysfunction 01/29/2014  . Medicare annual wellness visit, subsequent 12/04/2013  . Constipation 06/05/2013  . Tachycardia 02/23/2013  . Sun-damaged skin 10/24/2012  . UTI (lower urinary tract infection) 10/24/2012  . Back pain 04/07/2011  . Allergic rhinitis 05/23/2009  . Gout 01/11/2008  . VARICOSE VEINS, LOWER EXTREMITIES 06/01/2007  . ADJ DISORDER WITH MIXED ANXIETY & DEPRESSED MOOD 03/25/2007  . Hyperlipidemia, mixed 12/29/2006  . Essential hypertension 12/29/2006  . Osteopenia 12/29/2006  . PELVIC PAIN, RIGHT 12/29/2006  . Diabetes type 2, controlled ([place]) 12/29/2006  . Asthma, moderate persistent 08/21/2006  . GERD 08/21/2006  . OSTEOARTHRITIS 08/21/2006  . DVT, HX OF 08/21/2006    Past Surgical History:  Procedure Laterality Date  . APPENDECTOMY  1951  . TONSILLECTOMY  1950  . TUBAL LIGATION  1968    OB History    No data available       Home Medications    Prior to Admission medications   Medication  Sig Start Date End Date Taking? Authorizing Provider  acetaminophen (TYLENOL) 500 MG tablet Take 500 mg by mouth every 6 (six) hours as needed.    Historical Provider, MD  albuterol (ACCUNEB) 0.63 MG/3ML nebulizer solution USE ONE VIAL IN NEBULIZER EVERY 6 HOURS AS NEEDED 08/20/15   Mosie Lukes, MD  albuterol (PROAIR HFA) 108 (90 Base) MCG/ACT inhaler Inhale 2 puffs into the lungs every 4 (four) hours as needed. 04/10/15   Mosie Lukes, MD  allopurinol (ZYLOPRIM) 100 MG tablet Take 2 tablets (200 mg total) by mouth daily. 01/15/16   Mosie Lukes, MD  aspirin EC  81 MG tablet Take 1 tablet (81 mg total) by mouth daily. 08/03/15   Mosie Lukes, MD  b complex vitamins tablet Take 1 tablet by mouth daily.    Historical Provider, MD  benzonatate (TESSALON) 100 MG capsule Take 1 capsule (100 mg total) by mouth 3 (three) times daily as needed for cough. 12/20/15   Edward Saguier, PA-C  budesonide-formoterol (SYMBICORT) 160-4.5 MCG/ACT inhaler INHALE 2 PUFFS INTO THE LUNGS 2 TIMES DAILY 04/10/15   Mosie Lukes, MD  cefdinir (OMNICEF) 300 MG capsule Take 1 capsule (300 mg total) by mouth 2 (two) times daily. 01/08/16   Mosie Lukes, MD  Cyanocobalamin (VITAMIN B-12 PO) Take by mouth daily.    Historical Provider, MD  Ferrous Fumarate-Folic Acid (HEMOCYTE-F) 324-1 MG TABS Take 1 tablet by mouth daily. 01/08/16   Mosie Lukes, MD  fluticasone (FLONASE) 50 MCG/ACT nasal spray Place 2 sprays into both nostrils daily. 01/24/14   Brunetta Jeans, PA-C  furosemide (LASIX) 40 MG tablet TAKE 1/2 TABLET EVERY OTHER DAY 03/17/14   Mosie Lukes, MD  glucose blood (ONE TOUCH TEST STRIPS) test strip Use as instructed to check blood sugar once weekly. DX 790.29     Historical Provider, MD  losartan (COZAAR) 25 MG tablet TAKE 1 TABLET EVERY DAY 10/18/15   Mosie Lukes, MD  metoprolol succinate (TOPROL-XL) 25 MG 24 hr tablet TAKE 1 TABLET EVERY DAY 01/25/16   Mosie Lukes, MD  montelukast (SINGULAIR) 10 MG tablet Take 1 tablet (10 mg total) by mouth at bedtime as needed. 10/08/15   Mosie Lukes, MD  Niacin (VITAMIN B-3 PO) Take 1 tablet by mouth daily.    Historical Provider, MD  OXYGEN Inhale into the lungs.    Historical Provider, MD  pantoprazole (PROTONIX) 40 MG tablet TAKE 1 TABLET TWICE DAILY 08/28/15   Mosie Lukes, MD  potassium chloride SA (K-DUR,KLOR-CON) 20 MEQ tablet TAKE 1 TABLET EVERY DAY 12/17/15   Mosie Lukes, MD  pravastatin (PRAVACHOL) 20 MG tablet TAKE 1 TABLET EVERY DAY 11/12/15   Mosie Lukes, MD  predniSONE (STERAPRED UNI-PAK 21 TAB) 10 MG (21)  TBPK tablet Take 1 tablet (10 mg total) by mouth daily. Take 6 tabs by mouth daily  for 2 days, then 5 tabs for 2 days, then 4 tabs for 2 days, then 3 tabs for 2 days, 2 tabs for 2 days, then 1 tab by mouth daily for 2 days 02/14/16   Alfonzo Beers, MD  Probiotic Product (PROBIOTIC PO) Take by mouth daily.    Historical Provider, MD  ranitidine (ZANTAC) 300 MG tablet TAKE 1 TABLET AT BEDTIME 04/22/15   Mosie Lukes, MD  sertraline (ZOLOFT) 100 MG tablet Take 1 tablet (100 mg total) by mouth daily. 01/07/16   Mosie Lukes, MD  [place] 18  MCG inhalation capsule PLACE 1 CAPSULE (18 MCG TOTAL) INTO INHALER AND INHALE CONTENTS ONE TIME DAILY. 02/14/16   Mosie Lukes, MD  traMADol (ULTRAM) 50 MG tablet TAKE 1 TABLET THREE TIMES DAILY AS NEEDED 01/15/16   Colon Branch, MD    Family History Family History  Problem Relation Age of Onset  . Asthma Sister   . Arthritis Sister   . Hypertension Sister   . Hyperlipidemia Sister   . Cancer Sister 8    uterine  . Uterine cancer Sister   . Coronary artery disease Brother     x2  . Cancer Brother     lung, smoker  . Arthritis Brother   . Hypertension Sister   . Arthritis Sister   . Hyperlipidemia Sister   . Emphysema Sister   . COPD Sister     smoker  . Heart disease Sister   . Hyperlipidemia Sister   . Hypertension Sister   . Arthritis Sister   . Emphysema Brother   . Heart disease Brother     MI smoker  . Mental illness Father   . Alcohol abuse Father   . Heart disease Mother     MI  . Hyperlipidemia Mother   . Epilepsy Daughter   . Hypertension Daughter   . Obesity Daughter   . COPD Brother     smoker  . Cancer Brother     lung  . Coronary artery disease Other   . Colon polyps Sister     Social History Social History  Substance Use Topics  . Smoking status: Never Smoker  . Smokeless tobacco: Never Used  . Alcohol use No     Allergies   Oseltamivir phosphate; Sulfa antibiotics; Ace inhibitors; Indomethacin;  Montelukast sodium; and Sulfonamide derivatives   Review of Systems Review of Systems  ROS reviewed and all otherwise negative except for mentioned in HPI   Physical Exam Updated Vital Signs BP 101/59   Pulse 110   Temp 98 F (36.7 C) (Oral)   Resp 23   Ht 5\' 5"  (1.651 m)   Wt 215 lb (97.5 kg)   SpO2 93%   BMI 35.78 kg/m  Vitals reviewed Physical Exam Physical Examination: General appearance - alert, well appearing, and in no distress Mental status - alert, oriented to person, place, and time Eyes - no conjunctival injection, no scleral icterus Mouth - mucous membranes moist, pharynx normal without lesions Neck - supple, no significant adenopathy Chest - BSS, bilateral wheezing, decreased breath sounds bilaterally, normal respiratory effort Heart - normal rate, regular rhythm, normal S1, S2, no murmurs, rubs, clicks or gallops Abdomen - soft, nontender, nondistended, no masses or organomegaly Neurological - alert, oriented x 3, normal speech Extremities - peripheral pulses normal, no pedal edema, no clubbing or cyanosis Skin - normal coloration and turgor, no rashes  ED Treatments / Results  Labs (all labs ordered are listed, but only abnormal results are displayed) Labs Reviewed  CBC WITH DIFFERENTIAL/PLATELET - Abnormal; Notable for the following:       Result Value   RBC 3.32 (*)    Hemoglobin 10.2 (*)    HCT 32.6 (*)    All other components within normal limits  COMPREHENSIVE METABOLIC PANEL - Abnormal; Notable for the following:    Glucose, Bld 112 (*)    BUN 21 (*)    Creatinine, Ser 1.46 (*)    GFR calc non Af Amer 34 (*)    GFR calc Af Amer 40 (*)  All other components within normal limits    EKG  EKG Interpretation  Date/Time:  Thursday February 14 2016 12:57:36 EST Ventricular Rate:  86 PR Interval:    QRS Duration: 99 QT Interval:  361 QTC Calculation: 432 R Axis:   10 Text Interpretation:  Sinus rhythm Ventricular bigeminy Low voltage,  extremity and precordial leads Since previous tracing PVCs are new Confirmed by Canary Brim  MD, Isadore Bokhari [phone]) on 02/14/2016 2:02:54 PM       Radiology No results found.  Procedures Procedures (including critical care time)  Medications Ordered in ED Medications  albuterol (PROVENTIL) (2.5 MG/3ML) 0.083% nebulizer solution 2.5 mg (2.5 mg Nebulization Given 02/14/16 1322)  ipratropium-albuterol (DUONEB) 0.5-2.5 (3) MG/3ML nebulizer solution 3 mL (3 mLs Nebulization Given 02/14/16 1322)  albuterol (PROVENTIL) (2.5 MG/3ML) 0.083% nebulizer solution 5 mg (5 mg Nebulization Given 02/14/16 1421)  ipratropium (ATROVENT) nebulizer solution 0.5 mg (0.5 mg Nebulization Given 02/14/16 1421)  methylPREDNISolone sodium succinate (SOLU-MEDROL) 125 mg/2 mL injection 125 mg (125 mg Intravenous Given 02/14/16 1411)     Initial Impression / Assessment and Plan / ED Course  I have reviewed the triage vital signs and the nursing notes.  Pertinent labs & imaging results that were available during my care of the patient were reviewed by me and considered in my medical decision making (see chart for details).     Pt presenting with c/o shortness of breath, has been increasing her home o2 from 2 to 3 Liters.  After neb treatment in the ED she feels much improved and her wheezing has cleared.  Pt started on solumedrol, will do another prednisone taper.  CXR is clear, labs reassuring.  Pt is comfortable with plan for discharge.  Discharged with strict return precautions.  Pt agreeable with plan.  Final Clinical Impressions(s) / ED Diagnoses   Final diagnoses:  COPD exacerbation ([place])    New Prescriptions Discharge Medication List as of 02/14/2016  3:49 PM    START taking these medications   Details  predniSONE (STERAPRED UNI-PAK 21 TAB) 10 MG (21) TBPK tablet Take 1 tablet (10 mg total) by mouth daily. Take 6 tabs by mouth daily  for 2 days, then 5 tabs for 2 days, then 4 tabs for 2 days, then 3 tabs for 2 days,  2 tabs for 2 days, then 1 tab by mouth daily for 2 days, Starting Thu 02/14/2016, Print    SPIRIVA HANDIHALER 18 MCG inhalation capsule PLACE 1 CAPSULE (18 MCG TOTAL) INTO INHALER AND INHALE CONTENTS ONE TIME DAILY., Normal         Alfonzo Beers, MD 02/19/16 1620

## 2016-02-14 NOTE — Discharge Instructions (Signed)
Return to the ED with any concerns including difficulty breathing despite using albuterol every 4 hours, not drinking fluids, decreased urine output, vomiting and not able to keep down liquids or medications, decreased level of alertness/lethargy, or any other alarming symptoms °

## 2016-02-14 NOTE — ED Triage Notes (Signed)
Sob. Bronchitis 3 weeks ago. She is on home oxygen at 2l/m [place].

## 2016-02-14 NOTE — Telephone Encounter (Signed)
Called patient to follow-up. No [facility] note in system and has not been received via fax.   Pt reports she did speak to [facility] yesterday and she called 911 as advised. On EMS assessment, VSS. They did EKG and told her heart was skipping beats. She also had an episode of her foot and leg going numb on Tuesday morning, this has since resolved. EMS advised her to follow-up w/ PCP. At time of call, O2 95 at rest, dropped to 84 with minimal exertion. She is using 3L O2 via [place] continuously. She endorses DOE and dizziness, denies CP, SOB at rest. Given hypoxia w/ exertion despite supplemental O2, dizziness, and palpitations, advised pt to proceed to ED. She agreed to call her daughter and go to [facility] ED.   [name]-please follow-up regarding missing documentation from [facility].

## 2016-02-14 NOTE — Telephone Encounter (Signed)
Pt is currently at [facility] ED as directed.

## 2016-02-14 NOTE — ED Notes (Signed)
Patient transported to X-ray 

## 2016-02-21 DIAGNOSIS — R0902 Hypoxemia: Secondary | ICD-10-CM | POA: Diagnosis not present

## 2016-02-21 DIAGNOSIS — J4520 Mild intermittent asthma, uncomplicated: Secondary | ICD-10-CM | POA: Diagnosis not present

## 2016-03-05 ENCOUNTER — Ambulatory Visit: Payer: Commercial Managed Care - HMO | Admitting: Acute Care

## 2016-03-06 ENCOUNTER — Ambulatory Visit (INDEPENDENT_AMBULATORY_CARE_PROVIDER_SITE_OTHER): Payer: Medicare HMO | Admitting: Acute Care

## 2016-03-06 ENCOUNTER — Encounter: Payer: Self-pay | Admitting: Acute Care

## 2016-03-06 ENCOUNTER — Ambulatory Visit (INDEPENDENT_AMBULATORY_CARE_PROVIDER_SITE_OTHER)
Admission: RE | Admit: 2016-03-06 | Discharge: 2016-03-06 | Disposition: A | Discharge status: Home / Self Care | Payer: Medicare HMO | Source: Ambulatory Visit | Attending: Acute Care | Admitting: Acute Care

## 2016-03-06 VITALS — BP 102/70 | HR 107 | Ht 65.0 in | Wt 218.8 lb

## 2016-03-06 DIAGNOSIS — J309 Allergic rhinitis, unspecified: Secondary | ICD-10-CM

## 2016-03-06 DIAGNOSIS — R0602 Shortness of breath: Secondary | ICD-10-CM

## 2016-03-06 DIAGNOSIS — R05 Cough: Secondary | ICD-10-CM | POA: Diagnosis not present

## 2016-03-06 DIAGNOSIS — J9611 Chronic respiratory failure with hypoxia: Secondary | ICD-10-CM | POA: Diagnosis not present

## 2016-03-06 DIAGNOSIS — J40 Bronchitis, not specified as acute or chronic: Secondary | ICD-10-CM | POA: Insufficient documentation

## 2016-03-06 DIAGNOSIS — J42 Unspecified chronic bronchitis: Secondary | ICD-10-CM

## 2016-03-06 MED ORDER — FLUTICASONE PROPIONATE 50 MCG/ACT NA SUSP
1.0000 | Freq: Every day | NASAL | 3 refills | Status: DC
Start: 2016-03-06 — End: 2019-08-25

## 2016-03-06 MED ORDER — FAMOTIDINE 20 MG PO TABS: 20.0000 mg | ORAL_TABLET | Freq: Every day | ORAL | 0 refills | Status: DC

## 2016-03-06 MED ORDER — LORATADINE 10 MG PO TABS: 10.0000 mg | ORAL_TABLET | Freq: Every day | ORAL | 5 refills | Status: AC

## 2016-03-06 NOTE — Assessment & Plan Note (Addendum)
Flare with seasonal allergies contributing to slow to resolve bronchitis Plan: Add Claritin 10 mg daily Add Flonase Daily

## 2016-03-06 NOTE — Assessment & Plan Note (Addendum)
Slow to resolve flare with cough x 2 months. Treatment with ABX x 2 and Prednisone taper x 3 Afebrile,no purulent secretions Plan: CXR today. We will call you with results Sips of water instead of throat clearing Sugar free jolly ranchers for throat soothing Avoid mint, menthol, and chocolate. Claritin ( loratadine) 10 mg at bedtime daily. Flonase 1 puff each nare once daily. Add pepcid ( Famotidine) 20 mg at bedtime. Continue the Mucinex as you have been doing. Drink plenty of water with Mucinex. Continue Symbicort , Spiriva and Singulair as you have been doing. Rinse mouth after use. We will schedule you for Pulmonary Function Tests once you are better. Follow up as needed for continued cough/ bronchitis Follow up appointment with Dr. Elsworth Soho to establish as his patient in 3 months. Please contact office for sooner follow up if symptoms do not improve or worsen or seek emergency care

## 2016-03-06 NOTE — Patient Instructions (Addendum)
It is nice to meet you today. CXR today. We will call you with results Sips of water instead of throat clearing Sugar free jolly ranchers for throat soothing Avoid mint, menthol, and chocolate. Claritin ( loratadine) 10 mg at bedtime daily. Flonase 1 puff each nare once daily. Add pepcid ( Famotidine) 20 mg at bedtime. Continue the Mucinex as you have been doing. Drink plenty of water with Mucinex. Continue Symbicort , Spiriva and Singulair as you have been doing. Rinse mouth after use. We will schedule you for Pulmonary Function Tests once you are better. Follow up appointment with Dr. Elsworth Soho to establish as his patient in 3 months. Please contact office for sooner follow up if symptoms do not improve or worsen or seek emergency care

## 2016-03-06 NOTE — Progress Notes (Signed)
History of Present Illness [name] is a 76 y.o. female never smoker with chronic asthma and bronchitis, COPD oxygen dependent 24/7 , She was a previous Dr. Joya Gaskins patient, who will now be followed by Dr. Elsworth Soho.  03/06/2016 Acute OV: Pt. Presents for acute OV. She has had a cough x 2 months. The cough is productive for yellow to white thick secretions.She has been using Mucinex DM to help bring it up. She was first treated with Doxycycline 12/20/2015 and prednisone at that time. She states she did not get any better after the ABX and pred taper. She then saw Dr. Charlett Blake 01/06/2017, and was treated again with prednisone taper and  Antibiotic.She states she got slightly better, but then the cough with increased mucus production resumed.She went to the ED 02/14/2016. She was having oxygen desaturations at the time.She was treated at that time with Solumedrol IV x 1 and an additional prednisone taper. She has been off the prednisone x 1 week.She is currently using her albuterol nebs twice daily. She states this really helps.She is using her rescue inhaler 4-5 times daily. She is using the tessalon as needed for cough.She is compliant with her Symbicort and Spiriva,and Singulair.Last CXR 1/25/20018 was positive for Chronic bronchitic-reactive airway changes.with questionable superimposed acute bronchitic change. There is no alveolar pneumonia nor pulmonary edema.She did note chills in December, but does not feel she has had fever recently.She denies chest pain, orthopnea or hemoptysis.She does not appear acutely ill, but I have asked her to have a low threshold for return if she worsens.  Tests  CXR 03/06/2016>>   CXR 02/14/2016: Chronic bronchitic-reactive airway changes. One cannot exclude superimposed acute bronchitic change. There is no alveolar pneumonia nor pulmonary edema.  Past medical hx Past Medical History:  Diagnosis Date  . Abnormal glucose tolerance test   . ACE-inhibitor cough   .  Anemia 03/12/2014  . Arthritis   . Asthma    PFT 02/06/09 FEV1 1.41 (65%), FVC 1.92 (64), FEV1% 74, TLC 3.47 (71%), DLCO 48%, +BD  . Atypical chest pain    s/p cath, Normal coronaries, Non ST elevation myocardial infarction, Rt groin pseudoaneurysm  . Bacterial vaginosis 03/12/2014  . COPD (chronic obstructive pulmonary disease) ([place])   . Depression   . DVT (deep venous thrombosis) ([place]) 1987  . Fall at home 12/04/2014  . GERD (gastroesophageal reflux disease)   . Gout   . Hypercalcemia 10/15/2014  . Hyperlipidemia   . Hypertension   . Hypoxia 10/15/2014  . Macular degeneration 04/10/2015  . Osteopenia 12/29/2006   Qualifier: Diagnosis of  By: Wynona Luna   . Pneumonia   . 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 Sun-damaged skin 10/24/2012  . Unspecified constipation 06/05/2013  . Urinary frequency 10/24/2012  . UTI (lower urinary tract infection) 10/24/2012  . Vitamin D deficiency 01/01/2015     Past surgical hx, Family hx, Social hx all reviewed.  Current Outpatient Prescriptions on File Prior to Visit  Medication Sig  . acetaminophen (TYLENOL) 500 MG tablet Take 500 mg by mouth every 6 (six) hours as needed.  Marland Kitchen albuterol (ACCUNEB) 0.63 MG/3ML nebulizer solution USE ONE VIAL IN NEBULIZER EVERY 6 HOURS AS NEEDED  . albuterol (PROAIR HFA) 108 (90 Base) MCG/ACT inhaler Inhale 2 puffs into the lungs every 4 (four) hours as needed.  Marland Kitchen allopurinol (ZYLOPRIM) 100 MG tablet Take 2 tablets (200 mg total) by mouth daily.  Marland Kitchen aspirin EC 81 MG tablet Take 1 tablet (81  mg total) by mouth daily.  Marland Kitchen b complex vitamins tablet Take 1 tablet by mouth daily.  . benzonatate (TESSALON) 100 MG capsule Take 1 capsule (100 mg total) by mouth 3 (three) times daily as needed for cough.  . budesonide-formoterol (SYMBICORT) 160-4.5 MCG/ACT inhaler INHALE 2 PUFFS INTO THE LUNGS 2 TIMES DAILY  . Cyanocobalamin (VITAMIN B-12 PO) Take by mouth daily.  . Ferrous Fumarate-Folic Acid (HEMOCYTE-F) 324-1 MG TABS  Take 1 tablet by mouth daily.  . fluticasone (FLONASE) 50 MCG/ACT nasal spray Place 2 sprays into both nostrils daily.  . furosemide (LASIX) 40 MG tablet TAKE 1/2 TABLET EVERY OTHER DAY  . glucose blood (ONE TOUCH TEST STRIPS) test strip Use as instructed to check blood sugar once weekly. DX 790.29   . losartan (COZAAR) 25 MG tablet TAKE 1 TABLET EVERY DAY  . metoprolol succinate (TOPROL-XL) 25 MG 24 hr tablet TAKE 1 TABLET EVERY DAY  . montelukast (SINGULAIR) 10 MG tablet Take 1 tablet (10 mg total) by mouth at bedtime as needed.  . Niacin (VITAMIN B-3 PO) Take 1 tablet by mouth daily.  . OXYGEN Inhale into the lungs.  . pantoprazole (PROTONIX) 40 MG tablet TAKE 1 TABLET TWICE DAILY  . potassium chloride SA (K-DUR,KLOR-CON) 20 MEQ tablet TAKE 1 TABLET EVERY DAY  . pravastatin (PRAVACHOL) 20 MG tablet TAKE 1 TABLET EVERY DAY  . Probiotic Product (PROBIOTIC PO) Take by mouth daily.  . ranitidine (ZANTAC) 300 MG tablet TAKE 1 TABLET AT BEDTIME  . sertraline (ZOLOFT) 100 MG tablet Take 1 tablet (100 mg total) by mouth daily.  Marland Kitchen SPIRIVA HANDIHALER 18 MCG inhalation capsule PLACE 1 CAPSULE (18 MCG TOTAL) INTO INHALER AND INHALE CONTENTS ONE TIME DAILY.  . traMADol (ULTRAM) 50 MG tablet TAKE 1 TABLET THREE TIMES DAILY AS NEEDED  . cefdinir (OMNICEF) 300 MG capsule Take 1 capsule (300 mg total) by mouth 2 (two) times daily. (Patient not taking: Reported on 03/06/2016)  . predniSONE (STERAPRED UNI-PAK 21 TAB) 10 MG (21) TBPK tablet Take 1 tablet (10 mg total) by mouth daily. Take 6 tabs by mouth daily  for 2 days, then 5 tabs for 2 days, then 4 tabs for 2 days, then 3 tabs for 2 days, 2 tabs for 2 days, then 1 tab by mouth daily for 2 days (Patient not taking: Reported on 03/06/2016)   No current facility-administered medications on file prior to visit.      Allergies  Allergen Reactions  . Oseltamivir Phosphate     REACTION: nausea, vomiting, diarrhea, dizziness  . Sulfa Antibiotics Other (See  Comments)    CP  . Ace Inhibitors   . Indomethacin   . Montelukast Sodium     REACTION: Heart palpitations, chest pain  . Sulfonamide Derivatives     Review Of Systems:  Constitutional:   No  weight loss, night sweats,  Fevers, chills, fatigue, or  lassitude.  HEENT:   No headaches,  Difficulty swallowing,  Tooth/dental problems, or  Sore throat,                No sneezing, itching, ear ache,+ nasal congestion,+ post nasal drip,   CV:  No chest pain,  Orthopnea, PND, swelling in lower extremities, anasarca, dizziness, palpitations, syncope.   GI  No heartburn, indigestion, abdominal pain, nausea, vomiting, diarrhea, change in bowel habits, loss of appetite, bloody stools.   Resp: + shortness of breath with exertion less at rest.  + excess mucus, + productive cough,  No  non-productive cough,  No coughing up of blood.  No change in color of mucus.  + wheezing.  No chest wall deformity  Skin: no rash or lesions.  GU: no dysuria, change in color of urine, no urgency or frequency.  No flank pain, no hematuria   MS:  No joint pain or swelling.  No decreased range of motion.  No back pain.  Psych:  No change in mood or affect. No depression or anxiety.  No memory loss.   Vital Signs BP 102/70 (BP Location: Right Arm, Patient Position: Sitting, Cuff Size: Large)   Pulse (!) 107   Ht 5\' 5"  (1.651 m)   Wt 218 lb 12.8 oz (99.2 kg)   SpO2 93%   BMI 36.41 kg/m   B/P is low normal, patient is on Toprol XL, and has no complaints of dizziness, or light headedness. Blood Pressure is within same  range as when she was seen in ED 02/14/2016. Physical Exam:  General- No distress,  A&Ox3 ENT: No sinus tenderness, TM clear, pale nasal mucosa, no oral exudate,no post nasal drip, no LAN Cardiac: S1, S2, regular rate and rhythm, no murmur Chest: No wheeze/ rales/ dullness; no accessory muscle use, no nasal flaring, no sternal retractions Abd.: Soft Non-tender Ext: No clubbing cyanosis,  edema Neuro:  normal strength Skin: No rashes, warm and dry Psych: normal mood and behavior   Assessment/Plan  Chronic respiratory failure (HCC) Continue Oxygen 2L [place] as ordered Oxygen saturations goals are 90-92% Continue Symbicort , Spiriva and Singulair as you have been doing. Continue nebulizer treatments as you have been doing. Follow up with Dr. Elsworth Soho in 3 months to establish with him as a patient. Please contact office for sooner follow up if symptoms do not improve or worsen or seek emergency care   Allergic rhinitis Flare with seasonal allergies contributing to slow to resolve bronchitis Plan: Add Claritin 10 mg daily Add Flonase Daily  Chronic bronchitis (HCC) Slow to resolve flare with cough x 2 months. Treatment with ABX x 2 and Prednisone taper x 3 Afebrile,no purulent secretions Plan: CXR today. We will call you with results Sips of water instead of throat clearing Sugar free jolly ranchers for throat soothing Avoid mint, menthol, and chocolate. Claritin ( loratadine) 10 mg at bedtime daily. Flonase 1 puff each nare once daily. Add pepcid ( Famotidine) 20 mg at bedtime. Continue the Mucinex as you have been doing. Drink plenty of water with Mucinex. Continue Symbicort , Spiriva and Singulair as you have been doing. Rinse mouth after use. We will schedule you for Pulmonary Function Tests once you are better. Follow up as needed for continued cough/ bronchitis Follow up appointment with Dr. Elsworth Soho to establish as his patient in 3 months. Please contact office for sooner follow up if symptoms do not improve or worsen or seek emergency care         Magdalen Spatz, NP 03/06/2016  1:16 PM

## 2016-03-06 NOTE — Assessment & Plan Note (Signed)
Continue Oxygen 2L  as ordered Oxygen saturations goals are 90-92% Continue Symbicort , Spiriva and Singulair as you have been doing. Continue nebulizer treatments as you have been doing. Follow up with Dr. Elsworth Soho in 3 months to establish with him as a patient. Please contact office for sooner follow up if symptoms do not improve or worsen or seek emergency care

## 2016-03-18 ENCOUNTER — Encounter: Payer: Self-pay | Admitting: Family Medicine

## 2016-03-18 ENCOUNTER — Other Ambulatory Visit: Payer: Self-pay | Admitting: Family Medicine

## 2016-03-18 ENCOUNTER — Other Ambulatory Visit: Payer: Self-pay | Admitting: Internal Medicine

## 2016-03-18 NOTE — Telephone Encounter (Signed)
OK to refill Tramadol with same number but update UDS and contract

## 2016-03-18 NOTE — Telephone Encounter (Signed)
Printed prescription/contract. Patient informed PCP instructions and verbalized understanding.

## 2016-03-20 DIAGNOSIS — R0902 Hypoxemia: Secondary | ICD-10-CM | POA: Diagnosis not present

## 2016-03-20 DIAGNOSIS — J4520 Mild intermittent asthma, uncomplicated: Secondary | ICD-10-CM | POA: Diagnosis not present

## 2016-03-24 ENCOUNTER — Encounter: Payer: Self-pay | Admitting: Family Medicine

## 2016-03-24 DIAGNOSIS — Z79891 Long term (current) use of opiate analgesic: Secondary | ICD-10-CM | POA: Diagnosis not present

## 2016-04-02 NOTE — Progress Notes (Signed)
Office Visit Note  Patient: [name]             Date of Birth: Mar 07, 1940           MRN: [id number]             PCP: Penni Homans, MD Referring: Mosie Lukes, MD Visit Date: 04/07/2016 Occupation: _0 @    Subjective:   Pain in bilateral knees  History of Present Illness: [name] is a 76 y.o. female seen in consultation per request of her PCP. According to patient she has had pain in her knee joints for the last 20 years which gets worse at times. She states that the tramadol and Tylenol she manages her pain quite well. She's also had history of gout for 10 years for which she's taking allopurinol and denies any gout flares. She was experiencing some fatigue according to patient she had cardiology workup and was felt that her fatigue was related to her heart and asthma. She states she had 3 rounds of prednisone for bronchitis.. She states she has only difficulty getting out of chair in her knees are bothering her. She has not difficulty raising her arms. She's concerned about the pedal edema and a rash she gets on her lower extremities from pedal edema.  Activities of fDaily Living:  Patient reports morning stiffness for15 minutes.   Patient Denies nocturnal pain.  Difficulty dressing/grooming: Denies Difficulty climbing stairs: Reports Difficulty getting out of chair: Denies Difficulty using hands for taps, buttons, cutlery, and/or writing: Denies   Review of Systems  Constitutional: Positive for fatigue and weight gain. Negative for night sweats, weight loss and weakness.  HENT: Positive for mouth dryness. Negative for mouth sores, trouble swallowing, trouble swallowing and nose dryness.   Eyes: Positive for dryness. Negative for pain, redness and visual disturbance.  Respiratory: Positive for shortness of breath. Negative for cough and difficulty breathing.   Cardiovascular: Negative for chest pain, palpitations, hypertension, irregular heartbeat and swelling  in legs/feet.  Gastrointestinal: Negative for blood in stool, constipation and diarrhea.  Endocrine: Negative for increased urination.  Genitourinary: Negative for vaginal dryness.  Musculoskeletal: Positive for arthralgias, joint pain and morning stiffness. Negative for joint swelling, myalgias, muscle weakness, muscle tenderness and myalgias.  Skin: Positive for rash. Negative for color change, hair loss, skin tightness, ulcers and sensitivity to sunlight.       On lower extremities  Allergic/Immunologic: Negative for susceptible to infections.  Neurological: Negative for dizziness, memory loss and night sweats.  Hematological: Negative for swollen glands.  Psychiatric/Behavioral: Positive for depressed mood. Negative for sleep disturbance. The patient is nervous/anxious.     PMFS History:  Patient Active Problem List   Diagnosis Date Noted  . Chronic bronchitis ([place]) 03/06/2016  . Polymyalgia rheumatica ([place]) 01/07/2016  . Pedal edema 08/28/2015  . Chronic respiratory failure ([place]) 06/28/2015  . Macular degeneration 04/10/2015  . Fall at home 12/04/2014  . Hypercalcemia 10/15/2014  . Hypoxia 10/15/2014  . Anemia 03/12/2014  . Cervical cancer screening 02/28/2014  . Eustachian tube dysfunction 01/29/2014  . Medicare annual wellness visit, subsequent 12/04/2013  . Constipation 06/05/2013  . Tachycardia 02/23/2013  . Sun-damaged skin 10/24/2012  . UTI (lower urinary tract infection) 10/24/2012  . Back pain 04/07/2011  . Allergic rhinitis 05/23/2009  . Gout 01/11/2008  . VARICOSE VEINS, LOWER EXTREMITIES 06/01/2007  . ADJ DISORDER WITH MIXED ANXIETY & DEPRESSED MOOD 03/25/2007  . Hyperlipidemia, mixed 12/29/2006  . Essential hypertension 12/29/2006  .  Osteopenia 12/29/2006  . PELVIC PAIN, RIGHT 12/29/2006  . Diabetes type 2, controlled ([place]) 12/29/2006  . Asthma, moderate persistent 08/21/2006  . GERD 08/21/2006  . OSTEOARTHRITIS 08/21/2006  . DVT, HX OF 08/21/2006      Past Medical History:  Diagnosis Date  . Abnormal glucose tolerance test   . ACE-inhibitor cough   . Anemia 03/12/2014  . Arthritis   . Asthma    PFT 02/06/09 FEV1 1.41 (65%), FVC 1.92 (64), FEV1% 74, TLC 3.47 (71%), DLCO 48%, +BD  . Atypical chest pain    s/p cath, Normal coronaries, Non ST elevation myocardial infarction, Rt groin pseudoaneurysm  . Bacterial vaginosis 03/12/2014  . COPD (chronic obstructive pulmonary disease) ([place])   . Depression   . DVT (deep venous thrombosis) ([place]) 1987  . Fall at home 12/04/2014  . GERD (gastroesophageal reflux disease)   . Gout   . Hypercalcemia 10/15/2014  . Hyperlipidemia   . Hypertension   . Hypoxia 10/15/2014  . Macular degeneration 04/10/2015  . Osteopenia 12/29/2006   Qualifier: Diagnosis of  By: Wynona Luna   . Pneumonia   . 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 Sun-damaged skin 10/24/2012  . Unspecified constipation 06/05/2013  . Urinary frequency 10/24/2012  . UTI (lower urinary tract infection) 10/24/2012  . Vitamin D deficiency 01/01/2015    Family History  Problem Relation Age of Onset  . Asthma Sister   . Arthritis Sister   . Hypertension Sister   . Hyperlipidemia Sister   . Cancer Sister 56    uterine  . Uterine cancer Sister   . Coronary artery disease Brother     x2  . Cancer Brother     lung, smoker  . Arthritis Brother   . Hypertension Sister   . Arthritis Sister   . Hyperlipidemia Sister   . Emphysema Sister   . COPD Sister     smoker  . Heart disease Sister   . Hyperlipidemia Sister   . Hypertension Sister   . Arthritis Sister   . Emphysema Brother   . Heart disease Brother     MI smoker  . Mental illness Father   . Alcohol abuse Father   . Heart disease Mother     MI  . Hyperlipidemia Mother   . Epilepsy Daughter   . Hypertension Daughter   . Obesity Daughter   . COPD Brother     smoker  . Cancer Brother     lung  . Coronary artery disease Other   . Colon polyps Sister    Past  Surgical History:  Procedure Laterality Date  . APPENDECTOMY  1951  . TONSILLECTOMY  1950  . TUBAL LIGATION  1968   Social History   Social History Narrative  . No narrative on file     Objective: Vital Signs: BP (!) 142/72   Pulse 94   Resp 20   Ht _0  (1.651 m)   Wt 220 lb (99.8 kg)   BMI 36.61 kg/m    Physical Exam  Constitutional: She is oriented to person, place, and time. She appears well-developed and well-nourished.  HENT:  Head: Normocephalic and atraumatic.  Eyes: Conjunctivae and EOM are normal.  Neck: Normal range of motion.  Cardiovascular: Normal rate, regular rhythm, normal heart sounds and intact distal pulses.   Pitting edema on bilateral lower extremities with hyperpigmentation  Pulmonary/Chest: Effort normal and breath sounds normal.  On oxygen per nasal cannula  Abdominal: Soft. Bowel sounds are  normal.  Lymphadenopathy:    She has no cervical adenopathy.  Neurological: She is alert and oriented to person, place, and time.  Skin: Skin is warm and dry. Capillary refill takes less than 2 seconds.  Psychiatric: She has a normal mood and affect. Her behavior is normal.  Nursing note and vitals reviewed.    Musculoskeletal Exam: C-spine good range of motion she has some thoracic kyphosis. She has discomfort with range of motion of her lumbar spine. Shoulder joints are good range of motion, she had no difficulty raising her arms. Elbow joints wrist joints are good range of motion. She does have osteoarthritic changes in her hands but DIP PIP thickening and subluxation of smoker joints. She said difficulty getting up from the sitting position which she described due to lower back pain. Her knee joints are good range of motion with no synovitis although she did have some discomfort range of motion of her knee joints. Ankle joints are good range of motion with no discomfort she has some pedal edema.  CDAI Exam: No CDAI exam completed.     Investigation: Findings:  02/14/2016 CBC hemoglobin 10.2, CMP creatinine 1.46, GFR 40 01/06/2017 uric acid 6.2, lipid panel normal, ESR 31, TSH normal In July 2017 vitamin D 41.06, March 2017 magnesium normal    Imaging: No results found.  Speciality Comments: No specialty comments available.    Procedures:  No procedures performed Allergies: Oseltamivir phosphate; Sulfa antibiotics; Ace inhibitors; Indomethacin; Montelukast sodium; and Sulfonamide derivatives   Assessment / Plan:     Visit Diagnoses: Chronic pain of both knees: Patient reports that she has known history of osteoarthritis and she is followed by an orthopedic surgeon. She states the knee joint replacement has been discussed but she is not a candidate. She did not want me to explore this any further.  Primary osteoarthritis of both hands: She has some osteoarthritic changes in her hands but she does not have much discomfort.  He was a concern about possible polymyalgia rheumatica by her PCP. She had to muscular weakness or tenderness on examination today. she had no difficulty raising her arms. She has some difficulty getting up from chair which she relates to her lower back pain. Her sedimentation rate was only mildly elevated. I'll be happy to reevaluate her this any new concerns.  Pedal edema: Her main concern today was pedal edema. I will advised to discuss that further with her PCP.  History of gout: Patient reports that her gout is very well controlled without any flares.  Her other medical problems are listed as follows:  Osteopenia   Essential hypertension  History of asthma  Gastroesophageal reflux disease   Controlled type 2 diabetes mellitus   Anemia, unspecified type  Macular degeneration    Orders: No orders of the defined types were placed in this encounter.  No orders of the defined types were placed in this encounter.   Face-to-face time spent with patient was 40 minutes. 50%  of time was spent in counseling and coordination of care.  Follow-Up Instructions: Return if symptoms worsen or fail to improve, for Osteoarthritis.   Bo Merino, MD  Note - This record has been created using Editor, commissioning.  Chart creation errors have been sought, but may not always  have been located. Such creation errors do not reflect on  the standard of medical care.

## 2016-04-07 ENCOUNTER — Ambulatory Visit (INDEPENDENT_AMBULATORY_CARE_PROVIDER_SITE_OTHER): Payer: Medicare HMO | Admitting: Rheumatology

## 2016-04-07 ENCOUNTER — Ambulatory Visit (HOSPITAL_BASED_OUTPATIENT_CLINIC_OR_DEPARTMENT_OTHER)
Admission: RE | Admit: 2016-04-07 | Discharge: 2016-04-07 | Disposition: A | Discharge status: Home / Self Care | Payer: Medicare HMO | Source: Ambulatory Visit | Attending: Family Medicine | Admitting: Family Medicine

## 2016-04-07 ENCOUNTER — Ambulatory Visit (INDEPENDENT_AMBULATORY_CARE_PROVIDER_SITE_OTHER): Payer: Medicare HMO | Admitting: Family Medicine

## 2016-04-07 ENCOUNTER — Encounter: Payer: Self-pay | Admitting: Rheumatology

## 2016-04-07 ENCOUNTER — Encounter: Payer: Self-pay | Admitting: Family Medicine

## 2016-04-07 VITALS — BP 124/73 | HR 97 | Temp 98.4°F | Wt 225.5 lb

## 2016-04-07 VITALS — BP 142/72 | HR 94 | Resp 20 | Ht 65.0 in | Wt 220.0 lb

## 2016-04-07 DIAGNOSIS — R000 Tachycardia, unspecified: Secondary | ICD-10-CM | POA: Diagnosis not present

## 2016-04-07 DIAGNOSIS — I5042 Chronic combined systolic (congestive) and diastolic (congestive) heart failure: Secondary | ICD-10-CM | POA: Insufficient documentation

## 2016-04-07 DIAGNOSIS — J4540 Moderate persistent asthma, uncomplicated: Secondary | ICD-10-CM

## 2016-04-07 DIAGNOSIS — K219 Gastro-esophageal reflux disease without esophagitis: Secondary | ICD-10-CM | POA: Diagnosis not present

## 2016-04-07 DIAGNOSIS — I5032 Chronic diastolic (congestive) heart failure: Secondary | ICD-10-CM

## 2016-04-07 DIAGNOSIS — G8929 Other chronic pain: Secondary | ICD-10-CM

## 2016-04-07 DIAGNOSIS — I10 Essential (primary) hypertension: Secondary | ICD-10-CM

## 2016-04-07 DIAGNOSIS — R600 Localized edema: Secondary | ICD-10-CM

## 2016-04-07 DIAGNOSIS — M8589 Other specified disorders of bone density and structure, multiple sites: Secondary | ICD-10-CM

## 2016-04-07 DIAGNOSIS — E118 Type 2 diabetes mellitus with unspecified complications: Secondary | ICD-10-CM | POA: Diagnosis not present

## 2016-04-07 DIAGNOSIS — D649 Anemia, unspecified: Secondary | ICD-10-CM | POA: Diagnosis not present

## 2016-04-07 DIAGNOSIS — J811 Chronic pulmonary edema: Secondary | ICD-10-CM | POA: Insufficient documentation

## 2016-04-07 DIAGNOSIS — M353 Polymyalgia rheumatica: Secondary | ICD-10-CM | POA: Diagnosis not present

## 2016-04-07 DIAGNOSIS — M19042 Primary osteoarthritis, left hand: Secondary | ICD-10-CM

## 2016-04-07 DIAGNOSIS — M25561 Pain in right knee: Secondary | ICD-10-CM

## 2016-04-07 DIAGNOSIS — I517 Cardiomegaly: Secondary | ICD-10-CM | POA: Diagnosis not present

## 2016-04-07 DIAGNOSIS — M109 Gout, unspecified: Secondary | ICD-10-CM

## 2016-04-07 DIAGNOSIS — R3915 Urgency of urination: Secondary | ICD-10-CM

## 2016-04-07 DIAGNOSIS — M25562 Pain in left knee: Secondary | ICD-10-CM

## 2016-04-07 DIAGNOSIS — R0602 Shortness of breath: Secondary | ICD-10-CM | POA: Diagnosis not present

## 2016-04-07 DIAGNOSIS — M549 Dorsalgia, unspecified: Secondary | ICD-10-CM

## 2016-04-07 DIAGNOSIS — H3530 Unspecified macular degeneration: Secondary | ICD-10-CM | POA: Diagnosis not present

## 2016-04-07 DIAGNOSIS — I5030 Unspecified diastolic (congestive) heart failure: Secondary | ICD-10-CM

## 2016-04-07 DIAGNOSIS — M19041 Primary osteoarthritis, right hand: Secondary | ICD-10-CM

## 2016-04-07 DIAGNOSIS — E782 Mixed hyperlipidemia: Secondary | ICD-10-CM

## 2016-04-07 HISTORY — DX: Unspecified diastolic (congestive) heart failure: I50.30

## 2016-04-07 MED ORDER — PREDNISONE 20 MG PO TABS: 20.0000 mg | ORAL_TABLET | Freq: Every day | ORAL | 0 refills | Status: DC

## 2016-04-07 MED ORDER — METOPROLOL SUCCINATE ER 50 MG PO TB24: 50.0000 mg | ORAL_TABLET | Freq: Every day | ORAL | 3 refills | Status: DC

## 2016-04-07 NOTE — Assessment & Plan Note (Signed)
Check cmp 

## 2016-04-07 NOTE — Progress Notes (Signed)
Pre visit review using our clinic review tool, if applicable. No additional management support is needed unless otherwise documented below in the visit note. 

## 2016-04-07 NOTE — Progress Notes (Signed)
Patient ID: [name], female   DOB: 1941-01-19, 76 y.o.   MRN: [id number]   Subjective:  I acted as a Education administrator for Penni Homans, [place], [place]   Patient ID: [name], female    DOB: 1940/08/31, 76 y.o.   MRN: [id number]  Chief Complaint  Patient presents with  . Follow-up    78-month F/U from [facility].    Urinary Tract Infection   This is a new problem. The current episode started in the past 7 days. The problem has been unchanged. Associated symptoms include frequency and urgency. Pertinent negatives include no hematuria or vomiting.    Patient is in today for a 49-month follow up. Patient states that she may have pulled a muscle in her back on Saturday. Also states that she has the urgency to urinate more frequently. Patient also states that she has been short of breath and wheezing more in the past week. Patient was placed on 2 liter of oxygen while in the office. Patient has no additional acute concerns noted at this time. She has had a rough week and notes a flare in her malaise and sob over past week. Denies f/c/n/v. SOB with minimal exertion at this time. Has persistent pedal edema but did not take lasix today. No recent hospitalization. Has long standing urinary incontinence but not worsened and no dysuria. Denies CP/palp/HA/fevers/GI c/o. Taking meds as prescribed  Patient Care Team: Mosie Lukes, MD as PCP - General (Family Medicine) Elsie Stain, MD as Consulting Physician (Pulmonary Disease) Calton Dach, MD as Referring Physician (Optometry) [place], NP as Nurse Practitioner (Pulmonary Disease) Bo Merino, MD as Consulting Physician (Rheumatology)   Past Medical History:  Diagnosis Date  . Abnormal glucose tolerance test   . ACE-inhibitor cough   . Anemia 03/12/2014  . Arthritis   . Asthma    PFT 02/06/09 FEV1 1.41 (65%), FVC 1.92 (64), FEV1% 74, TLC 3.47 (71%), DLCO 48%, +BD  . Atypical chest pain    s/p cath, Normal  coronaries, Non ST elevation myocardial infarction, Rt groin pseudoaneurysm  . Bacterial vaginosis 03/12/2014  . COPD (chronic obstructive pulmonary disease) ([place])   . Depression   . DVT (deep venous thrombosis) ([place]) 1987  . Fall at home 12/04/2014  . GERD (gastroesophageal reflux disease)   . Gout   . Hypercalcemia 10/15/2014  . Hyperlipidemia   . Hypertension   . Hypoxia 10/15/2014  . Macular degeneration 04/10/2015  . Osteopenia 12/29/2006   Qualifier: Diagnosis of  By: Wynona Luna   . Pneumonia   . 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 Sun-damaged skin 10/24/2012  . Unspecified constipation 06/05/2013  . Urinary frequency 10/24/2012  . UTI (lower urinary tract infection) 10/24/2012  . Vitamin D deficiency 01/01/2015    Past Surgical History:  Procedure Laterality Date  . APPENDECTOMY  1951  . TONSILLECTOMY  1950  . TUBAL LIGATION  1968    Family History  Problem Relation Age of Onset  . Asthma Sister   . Arthritis Sister   . Hypertension Sister   . Hyperlipidemia Sister   . Cancer Sister 48    uterine  . Uterine cancer Sister   . Coronary artery disease Brother     x2  . Cancer Brother     lung, smoker  . Arthritis Brother   . Hypertension Sister   . Arthritis Sister   . Hyperlipidemia Sister   . Emphysema Sister   .  COPD Sister     smoker  . Heart disease Sister   . Hyperlipidemia Sister   . Hypertension Sister   . Arthritis Sister   . Emphysema Brother   . Heart disease Brother     MI smoker  . Mental illness Father   . Alcohol abuse Father   . Heart disease Mother     MI  . Hyperlipidemia Mother   . Epilepsy Daughter   . Hypertension Daughter   . Obesity Daughter   . COPD Brother     smoker  . Cancer Brother     lung  . Coronary artery disease Other   . Colon polyps Sister     Social History   Social History  . Marital status: Widowed    Spouse name: N/A  . Number of children: 1  . Years of education: N/A   Occupational  History  . Retired [employer]   Social History Main Topics  . Smoking status: Never Smoker  . Smokeless tobacco: Never Used  . Alcohol use No  . Drug use: No  . Sexual activity: No     Comment: lives alone, no dietary restrictions   Other Topics Concern  . Not on file   Social History Narrative  . No narrative on file    Outpatient Medications Prior to Visit  Medication Sig Dispense Refill  . acetaminophen (TYLENOL) 500 MG tablet Take 500 mg by mouth every 6 (six) hours as needed.    Marland Kitchen albuterol (ACCUNEB) 0.63 MG/3ML nebulizer solution USE ONE VIAL IN NEBULIZER EVERY 6 HOURS AS NEEDED 150 mL 1  . albuterol (PROAIR HFA) 108 (90 Base) MCG/ACT inhaler Inhale 2 puffs into the lungs every 4 (four) hours as needed. 3 Inhaler 1  . allopurinol (ZYLOPRIM) 100 MG tablet Take 2 tablets (200 mg total) by mouth daily. 180 tablet 1  . aspirin EC 81 MG tablet Take 1 tablet (81 mg total) by mouth daily.    Marland Kitchen b complex vitamins tablet Take 1 tablet by mouth daily.    . benzonatate (TESSALON) 100 MG capsule Take 1 capsule (100 mg total) by mouth 3 (three) times daily as needed for cough. 21 capsule 0  . budesonide-formoterol (SYMBICORT) 160-4.5 MCG/ACT inhaler INHALE 2 PUFFS INTO THE LUNGS 2 TIMES DAILY 3 Inhaler 1  . Cyanocobalamin (VITAMIN B-12 PO) Take by mouth daily.    . famotidine (PEPCID) 20 MG tablet Take 1 tablet (20 mg total) by mouth at bedtime. 30 tablet 0  . Ferrous Fumarate-Folic Acid (HEMOCYTE-F) 324-1 MG TABS Take 1 tablet by mouth daily. 30 each 2  . fluticasone (FLONASE) 50 MCG/ACT nasal spray Place 1 spray into both nostrils daily. 16 g 3  . furosemide (LASIX) 40 MG tablet TAKE 1/2 TABLET EVERY OTHER DAY 23 tablet 6  . glucose blood (ONE TOUCH TEST STRIPS) test strip Use as instructed to check blood sugar once weekly. DX 790.29     . loratadine (CLARITIN) 10 MG tablet Take 1 tablet (10 mg total) by mouth at bedtime. 30 tablet 5  . losartan (COZAAR) 25 MG tablet TAKE 1 TABLET EVERY  DAY 90 tablet 1  . metoprolol succinate (TOPROL-XL) 25 MG 24 hr tablet TAKE 1 TABLET EVERY DAY 90 tablet 1  . montelukast (SINGULAIR) 10 MG tablet Take 1 tablet (10 mg total) by mouth at bedtime as needed. 30 tablet 3  . Niacin (VITAMIN B-3 PO) Take 1 tablet by mouth daily.    . OXYGEN Inhale into  the lungs.    . pantoprazole (PROTONIX) 40 MG tablet TAKE 1 TABLET TWICE DAILY 180 tablet 1  . potassium chloride SA (K-DUR,KLOR-CON) 20 MEQ tablet TAKE 1 TABLET EVERY DAY 90 tablet 2  . pravastatin (PRAVACHOL) 20 MG tablet TAKE 1 TABLET EVERY DAY 90 tablet 1  . predniSONE (DELTASONE) 10 MG tablet   0  . Probiotic Product (PROBIOTIC PO) Take by mouth daily.    . ranitidine (ZANTAC) 300 MG tablet TAKE 1 TABLET AT BEDTIME 90 tablet 3  . sertraline (ZOLOFT) 100 MG tablet Take 1 tablet (100 mg total) by mouth daily. 30 tablet 3  . SPIRIVA HANDIHALER 18 MCG inhalation capsule PLACE 1 CAPSULE (18 MCG TOTAL) INTO INHALER AND INHALE CONTENTS ONE TIME DAILY. 90 capsule 1  . traMADol (ULTRAM) 50 MG tablet TAKE 1 TABLET THREE TIMES DAILY AS NEEDED 90 tablet 0   No facility-administered medications prior to visit.     Allergies  Allergen Reactions  . Oseltamivir Phosphate     REACTION: nausea, vomiting, diarrhea, dizziness  . Sulfa Antibiotics Other (See Comments)    CP  . Ace Inhibitors   . Indomethacin   . Montelukast Sodium     REACTION: Heart palpitations, chest pain  . Sulfonamide Derivatives     Review of Systems  Constitutional: Positive for malaise/fatigue. Negative for fever.  HENT: Negative for congestion and sore throat.   Eyes: Negative for blurred vision.  Respiratory: Positive for sputum production, shortness of breath and wheezing. Negative for cough.   Cardiovascular: Negative for chest pain, palpitations and leg swelling.  Gastrointestinal: Negative for vomiting.  Genitourinary: Positive for frequency and urgency. Negative for dysuria and hematuria.  Musculoskeletal: Positive for  back pain.  Skin: Negative for rash.  Neurological: Negative for loss of consciousness and headaches.  Psychiatric/Behavioral: Positive for depression.       Objective:    Physical Exam  Constitutional: She is oriented to person, place, and time. She appears well-developed and well-nourished. No distress.  HENT:  Head: Normocephalic and atraumatic.  Eyes: Conjunctivae are normal.  Neck: Normal range of motion. No thyromegaly present.  Cardiovascular: Normal rate and regular rhythm.   Murmur heard. Pulmonary/Chest: Effort normal. She has wheezes.  expiratory  Abdominal: Soft. Bowel sounds are normal. There is no tenderness.  Musculoskeletal: She exhibits edema. She exhibits no deformity.  Neurological: She is alert and oriented to person, place, and time.  Skin: Skin is warm and dry. She is not diaphoretic.  Psychiatric: She has a normal mood and affect.    BP 124/73 (BP Location: Right Wrist, Patient Position: Sitting, Cuff Size: Normal)   Pulse 97   Temp 98.4 F (36.9 C) (Oral)   Wt 225 lb 8.3 oz (102.3 kg)   SpO2 95% Comment: O2  BMI 37.53 kg/m  Wt Readings from Last 3 Encounters:  04/07/16 225 lb 8.3 oz (102.3 kg)  04/07/16 220 lb (99.8 kg)  03/06/16 218 lb 12.8 oz (99.2 kg)   BP Readings from Last 3 Encounters:  04/07/16 124/73  04/07/16 (!) 142/72  03/06/16 102/70     Immunization History  Administered Date(s) Administered  . Influenza Split 12/08/2011  . Influenza Whole 10/20/2006, 11/09/2007, 11/14/2008, 10/30/2009  . Influenza, High Dose Seasonal PF 09/25/2015  . Influenza,inj,Quad PF,36+ Mos 10/20/2012, 11/28/2013, 10/02/2014  . Pneumococcal Conjugate-13 02/21/2013  . Pneumococcal Polysaccharide-23 06/30/2006  . Td 06/01/2007    Health Maintenance  Topic Date Due  . OPHTHALMOLOGY EXAM  10/07/2016 (Originally 10/08/2015)  .  FOOT EXAM  04/09/2016  . HEMOGLOBIN A1C  07/07/2016  . TETANUS/TDAP  05/31/2017  . COLONOSCOPY  11/10/2018  . INFLUENZA  VACCINE  Completed  . DEXA SCAN  Completed  . PNA vac Low Risk Adult  Completed    Lab Results  Component Value Date   WBC 6.3 02/14/2016   HGB 10.2 (L) 02/14/2016   HCT 32.6 (L) 02/14/2016   PLT 184 02/14/2016   GLUCOSE 112 (H) 02/14/2016   CHOL 148 01/07/2016   TRIG 138.0 01/07/2016   HDL 39.70 01/07/2016   LDLDIRECT 142.7 12/16/2005   LDLCALC 81 01/07/2016   ALT 15 02/14/2016   AST 19 02/14/2016   NA 137 02/14/2016   K 4.3 02/14/2016   CL 102 02/14/2016   CREATININE 1.46 (H) 02/14/2016   BUN 21 (H) 02/14/2016   CO2 28 02/14/2016   TSH 2.60 01/07/2016   INR 1.0 09/12/2006   HGBA1C 6.2 01/07/2016   MICROALBUR 0.8 02/28/2014    Lab Results  Component Value Date   TSH 2.60 01/07/2016   Lab Results  Component Value Date   WBC 6.3 02/14/2016   HGB 10.2 (L) 02/14/2016   HCT 32.6 (L) 02/14/2016   MCV 98.2 02/14/2016   PLT 184 02/14/2016   Lab Results  Component Value Date   NA 137 02/14/2016   K 4.3 02/14/2016   CO2 28 02/14/2016   GLUCOSE 112 (H) 02/14/2016   BUN 21 (H) 02/14/2016   CREATININE 1.46 (H) 02/14/2016   BILITOT 0.5 02/14/2016   ALKPHOS 53 02/14/2016   AST 19 02/14/2016   ALT 15 02/14/2016   PROT 6.9 02/14/2016   ALBUMIN 3.5 02/14/2016   CALCIUM 9.3 02/14/2016   ANIONGAP 7 02/14/2016   GFR 36.57 (L) 01/07/2016   Lab Results  Component Value Date   CHOL 148 01/07/2016   Lab Results  Component Value Date   HDL 39.70 01/07/2016   Lab Results  Component Value Date   LDLCALC 81 01/07/2016   Lab Results  Component Value Date   TRIG 138.0 01/07/2016   Lab Results  Component Value Date   CHOLHDL 4 01/07/2016   Lab Results  Component Value Date   HGBA1C 6.2 01/07/2016         Assessment & Plan:   Problem List Items Addressed This Visit    None      I am having Ms. Toelle maintain her glucose blood, acetaminophen, Niacin (VITAMIN B-3 PO), Cyanocobalamin (VITAMIN B-12 PO), OXYGEN, furosemide, b complex vitamins, Probiotic  Product (PROBIOTIC PO), albuterol, budesonide-formoterol, ranitidine, aspirin EC, albuterol, montelukast, losartan, pravastatin, potassium chloride SA, benzonatate, sertraline, Ferrous Fumarate-Folic Acid, allopurinol, metoprolol succinate, SPIRIVA HANDIHALER, loratadine, fluticasone, famotidine, pantoprazole, traMADol, and predniSONE.  No orders of the defined types were placed in this encounter.   CMA served as Education administrator during this visit. History, Physical and Plan performed by medical provider. Documentation and orders reviewed and attested to.  Shan Levans, CMA

## 2016-04-07 NOTE — Assessment & Plan Note (Signed)
With increased urinary frequency. Check UA with c and s today

## 2016-04-07 NOTE — Assessment & Plan Note (Signed)
Repeat echo due to worsening SOB and refer back to cardiology for further evaluation

## 2016-04-07 NOTE — Assessment & Plan Note (Signed)
hgba1c acceptable, minimize simple carbs. Increase exercise as tolerated. Continue current meds 

## 2016-04-07 NOTE — Assessment & Plan Note (Signed)
Seen by rheumatology they do not concur with PMR diagnosis but she did respond to steroids and is improved symptomatically will continue to monitor and refer back as needed

## 2016-04-07 NOTE — Patient Instructions (Signed)
DASH Eating Plan DASH stands for "Dietary Approaches to Stop Hypertension." The DASH eating plan is a healthy eating plan that has been shown to reduce high blood pressure (hypertension). It may also reduce your risk for type 2 diabetes, heart disease, and stroke. The DASH eating plan may also help with weight loss. What are tips for following this plan? General guidelines  Avoid eating more than 2,300 mg (milligrams) of salt (sodium) a day. If you have hypertension, you may need to reduce your sodium intake to 1,500 mg a day.  Limit alcohol intake to no more than 1 drink a day for nonpregnant women and 2 drinks a day for men. One drink equals 12 oz of beer, 5 oz of wine, or 1 oz of hard liquor.  Work with your health care provider to maintain a healthy body weight or to lose weight. Ask what an ideal weight is for you.  Get at least 30 minutes of exercise that causes your heart to beat faster (aerobic exercise) most days of the week. Activities may include walking, swimming, or biking.  Work with your health care provider or diet and nutrition specialist (dietitian) to adjust your eating plan to your individual calorie needs. Reading food labels  Check food labels for the amount of sodium per serving. Choose foods with less than 5 percent of the Daily Value of sodium. Generally, foods with less than 300 mg of sodium per serving fit into this eating plan.  To find whole grains, look for the word "whole" as the first word in the ingredient list. Shopping  Buy products labeled as "low-sodium" or "no salt added."  Buy fresh foods. Avoid canned foods and premade or frozen meals. Cooking  Avoid adding salt when cooking. Use salt-free seasonings or herbs instead of table salt or sea salt. Check with your health care provider or pharmacist before using salt substitutes.  Do not fry foods. Cook foods using healthy methods such as baking, boiling, grilling, and broiling instead.  Cook with  heart-healthy oils, such as olive, canola, soybean, or sunflower oil. Meal planning   Eat a balanced diet that includes: ? 5 or more servings of fruits and vegetables each day. At each meal, try to fill half of your plate with fruits and vegetables. ? Up to 6-8 servings of whole grains each day. ? Less than 6 oz of lean meat, poultry, or fish each day. A 3-oz serving of meat is about the same size as a deck of cards. One egg equals 1 oz. ? 2 servings of low-fat dairy each day. ? A serving of nuts, seeds, or beans 5 times each week. ? Heart-healthy fats. Healthy fats called Omega-3 fatty acids are found in foods such as flaxseeds and coldwater fish, like sardines, salmon, and mackerel.  Limit how much you eat of the following: ? Canned or prepackaged foods. ? Food that is high in trans fat, such as fried foods. ? Food that is high in saturated fat, such as fatty meat. ? Sweets, desserts, sugary drinks, and other foods with added sugar. ? Full-fat dairy products.  Do not salt foods before eating.  Try to eat at least 2 vegetarian meals each week.  Eat more home-cooked food and less restaurant, buffet, and fast food.  When eating at a restaurant, ask that your food be prepared with less salt or no salt, if possible. What foods are recommended? The items listed may not be a complete list. Talk with your dietitian about what   dietary choices are best for you. Grains Whole-grain or whole-wheat bread. Whole-grain or whole-wheat pasta. Brown rice. Oatmeal. Quinoa. Bulgur. Whole-grain and low-sodium cereals. Pita bread. Low-fat, low-sodium crackers. Whole-wheat flour tortillas. Vegetables Fresh or frozen vegetables (raw, steamed, roasted, or grilled). Low-sodium or reduced-sodium tomato and vegetable juice. Low-sodium or reduced-sodium tomato sauce and tomato paste. Low-sodium or reduced-sodium canned vegetables. Fruits All fresh, dried, or frozen fruit. Canned fruit in natural juice (without  added sugar). Meat and other protein foods Skinless chicken or turkey. Ground chicken or turkey. Pork with fat trimmed off. Fish and seafood. Egg whites. Dried beans, peas, or lentils. Unsalted nuts, nut butters, and seeds. Unsalted canned beans. Lean cuts of beef with fat trimmed off. Low-sodium, lean deli meat. Dairy Low-fat (1%) or fat-free (skim) milk. Fat-free, low-fat, or reduced-fat cheeses. Nonfat, low-sodium ricotta or cottage cheese. Low-fat or nonfat yogurt. Low-fat, low-sodium cheese. Fats and oils Soft margarine without trans fats. Vegetable oil. Low-fat, reduced-fat, or light mayonnaise and salad dressings (reduced-sodium). Canola, safflower, olive, soybean, and sunflower oils. Avocado. Seasoning and other foods Herbs. Spices. Seasoning mixes without salt. Unsalted popcorn and pretzels. Fat-free sweets. What foods are not recommended? The items listed may not be a complete list. Talk with your dietitian about what dietary choices are best for you. Grains Baked goods made with fat, such as croissants, muffins, or some breads. Dry pasta or rice meal packs. Vegetables Creamed or fried vegetables. Vegetables in a cheese sauce. Regular canned vegetables (not low-sodium or reduced-sodium). Regular canned tomato sauce and paste (not low-sodium or reduced-sodium). Regular tomato and vegetable juice (not low-sodium or reduced-sodium). Pickles. Olives. Fruits Canned fruit in a light or heavy syrup. Fried fruit. Fruit in cream or butter sauce. Meat and other protein foods Fatty cuts of meat. Ribs. Fried meat. Bacon. Sausage. Bologna and other processed lunch meats. Salami. Fatback. Hotdogs. Bratwurst. Salted nuts and seeds. Canned beans with added salt. Canned or smoked fish. Whole eggs or egg yolks. Chicken or turkey with skin. Dairy Whole or 2% milk, cream, and half-and-half. Whole or full-fat cream cheese. Whole-fat or sweetened yogurt. Full-fat cheese. Nondairy creamers. Whipped toppings.  Processed cheese and cheese spreads. Fats and oils Butter. Stick margarine. Lard. Shortening. Ghee. Bacon fat. Tropical oils, such as coconut, palm kernel, or palm oil. Seasoning and other foods Salted popcorn and pretzels. Onion salt, garlic salt, seasoned salt, table salt, and sea salt. Worcestershire sauce. Tartar sauce. Barbecue sauce. Teriyaki sauce. Soy sauce, including reduced-sodium. Steak sauce. Canned and packaged gravies. Fish sauce. Oyster sauce. Cocktail sauce. Horseradish that you find on the shelf. Ketchup. Mustard. Meat flavorings and tenderizers. Bouillon cubes. Hot sauce and Tabasco sauce. Premade or packaged marinades. Premade or packaged taco seasonings. Relishes. Regular salad dressings. Where to find more information:  National Heart, Lung, and Blood Institute: www.nhlbi.nih.gov  American Heart Association: www.heart.org Summary  The DASH eating plan is a healthy eating plan that has been shown to reduce high blood pressure (hypertension). It may also reduce your risk for type 2 diabetes, heart disease, and stroke.  With the DASH eating plan, you should limit salt (sodium) intake to 2,300 mg a day. If you have hypertension, you may need to reduce your sodium intake to 1,500 mg a day.  When on the DASH eating plan, aim to eat more fresh fruits and vegetables, whole grains, lean proteins, low-fat dairy, and heart-healthy fats.  Work with your health care provider or diet and nutrition specialist (dietitian) to adjust your eating plan to your individual   calorie needs. This information is not intended to replace advice given to you by your health care provider. Make sure you discuss any questions you have with your health care provider. Document Released: 12/26/2010 Document Revised: 12/31/2015 Document Reviewed: 12/31/2015 Elsevier Interactive Patient Education  2017 [employer] Inc.  

## 2016-04-07 NOTE — Assessment & Plan Note (Signed)
Flared over past week, Prednisone 20 mg po daily x 5 days, cxr today, continue inhalers.

## 2016-04-07 NOTE — Assessment & Plan Note (Signed)
Encouraged heart healthy diet, increase exercise, avoid trans fats, consider a krill oil cap daily 

## 2016-04-07 NOTE — Assessment & Plan Note (Signed)
Increase metoprolol XL to 50 mg daily.

## 2016-04-07 NOTE — Assessment & Plan Note (Signed)
Did not take Lasix today or elevate feet. Encouraged DASH diet and elevation restarst Lasix 40 and if still swollen can add an extra 20 mg ot lasix daily for 3 days if no improvement return for eval

## 2016-04-07 NOTE — Assessment & Plan Note (Signed)
Check uric acid today 

## 2016-04-08 ENCOUNTER — Other Ambulatory Visit: Payer: Self-pay | Admitting: Family Medicine

## 2016-04-08 ENCOUNTER — Other Ambulatory Visit (INDEPENDENT_AMBULATORY_CARE_PROVIDER_SITE_OTHER): Payer: Medicare HMO

## 2016-04-08 DIAGNOSIS — R946 Abnormal results of thyroid function studies: Secondary | ICD-10-CM | POA: Diagnosis not present

## 2016-04-08 DIAGNOSIS — R609 Edema, unspecified: Secondary | ICD-10-CM

## 2016-04-08 DIAGNOSIS — E119 Type 2 diabetes mellitus without complications: Secondary | ICD-10-CM

## 2016-04-08 LAB — SEDIMENTATION RATE: SED RATE: 18 mm/h (ref 0–30)

## 2016-04-08 LAB — URINALYSIS WITH CULTURE, IF INDICATED
BILIRUBIN URINE: NEGATIVE
HGB URINE DIPSTICK: NEGATIVE
Ketones, ur: NEGATIVE
NITRITE: NEGATIVE
Specific Gravity, Urine: 1.02 (ref 1.000–1.030)
TOTAL PROTEIN, URINE-UPE24: NEGATIVE
URINE GLUCOSE: NEGATIVE
UROBILINOGEN UA: 0.2 (ref 0.0–1.0)
pH: 5.5 (ref 5.0–8.0)

## 2016-04-08 LAB — COMPREHENSIVE METABOLIC PANEL
ALT: 10 U/L (ref 0–35)
AST: 16 U/L (ref 0–37)
Albumin: 4.1 g/dL (ref 3.5–5.2)
Alkaline Phosphatase: 41 U/L (ref 39–117)
BILIRUBIN TOTAL: 0.6 mg/dL (ref 0.2–1.2)
BUN: 19 mg/dL (ref 6–23)
CO2: 30 meq/L (ref 19–32)
CREATININE: 1.27 mg/dL — AB (ref 0.40–1.20)
Calcium: 10.4 mg/dL (ref 8.4–10.5)
Chloride: 102 mEq/L (ref 96–112)
GFR: 43.6 mL/min — AB (ref >60.00)
GLUCOSE: 85 mg/dL (ref 70–99)
Potassium: 4.6 mEq/L (ref 3.5–5.1)
Sodium: 140 mEq/L (ref 135–145)
Total Protein: 7.2 g/dL (ref 6.0–8.3)

## 2016-04-08 LAB — CBC
HCT: 33.3 % — ABNORMAL LOW (ref 36.0–46.0)
Hemoglobin: 10.7 g/dL — ABNORMAL LOW (ref 12.0–15.0)
MCHC: 32.1 g/dL (ref 30.0–36.0)
MCV: 94.4 fl (ref 78.0–100.0)
Platelets: 159 10*3/uL (ref 150.0–400.0)
RBC: 3.53 Mil/uL — ABNORMAL LOW (ref 3.87–5.11)
RDW: 16.8 % — AB (ref 11.5–15.5)
WBC: 9.4 10*3/uL (ref 4.0–10.5)

## 2016-04-08 LAB — LIPID PANEL
CHOLESTEROL: 128 mg/dL (ref 0–200)
HDL: 51.8 mg/dL (ref >39.00)
LDL Cholesterol: 50 mg/dL (ref 0–99)
NonHDL: 76.23
Total CHOL/HDL Ratio: 2
Triglycerides: 130 mg/dL (ref 0.0–149.0)
VLDL: 26 mg/dL (ref 0.0–40.0)

## 2016-04-08 LAB — URIC ACID: Uric Acid, Serum: 4.6 mg/dL (ref 2.4–7.0)

## 2016-04-08 LAB — T4, FREE: Free T4: 1.17 ng/dL (ref 0.60–1.60)

## 2016-04-08 LAB — HEMOGLOBIN A1C: HEMOGLOBIN A1C: 5.9 % (ref 4.6–6.5)

## 2016-04-08 LAB — TSH: TSH: 4.77 u[IU]/mL — AB (ref 0.35–4.50)

## 2016-04-08 MED ORDER — FUROSEMIDE 20 MG PO TABS: ORAL_TABLET | ORAL | 3 refills | Status: DC

## 2016-04-08 MED ORDER — PREDNISONE 20 MG PO TABS
20.0000 mg | ORAL_TABLET | Freq: Every day | ORAL | 0 refills | Status: DC
Start: 2016-04-08 — End: 2016-04-16

## 2016-04-08 NOTE — Telephone Encounter (Signed)
Resent prednisone to [facility] that was sent in on 04/07/16 to mail order Did call [facility] to cancel that order.

## 2016-04-11 ENCOUNTER — Encounter: Payer: Self-pay | Admitting: Cardiology

## 2016-04-16 ENCOUNTER — Ambulatory Visit (INDEPENDENT_AMBULATORY_CARE_PROVIDER_SITE_OTHER): Payer: Medicare HMO | Admitting: Family Medicine

## 2016-04-16 ENCOUNTER — Ambulatory Visit (HOSPITAL_BASED_OUTPATIENT_CLINIC_OR_DEPARTMENT_OTHER)
Admission: RE | Admit: 2016-04-16 | Discharge: 2016-04-16 | Disposition: A | Discharge status: Home / Self Care | Payer: Medicare HMO | Source: Ambulatory Visit | Attending: Family Medicine | Admitting: Family Medicine

## 2016-04-16 VITALS — BP 113/56 | HR 87 | Temp 98.1°F | Ht 65.0 in | Wt 224.8 lb

## 2016-04-16 DIAGNOSIS — J811 Chronic pulmonary edema: Secondary | ICD-10-CM | POA: Diagnosis not present

## 2016-04-16 DIAGNOSIS — I517 Cardiomegaly: Secondary | ICD-10-CM | POA: Diagnosis not present

## 2016-04-16 DIAGNOSIS — J441 Chronic obstructive pulmonary disease with (acute) exacerbation: Secondary | ICD-10-CM | POA: Diagnosis not present

## 2016-04-16 DIAGNOSIS — R937 Abnormal findings on diagnostic imaging of other parts of musculoskeletal system: Secondary | ICD-10-CM | POA: Diagnosis not present

## 2016-04-16 DIAGNOSIS — R0602 Shortness of breath: Secondary | ICD-10-CM | POA: Diagnosis not present

## 2016-04-16 DIAGNOSIS — S5001XA Contusion of right elbow, initial encounter: Secondary | ICD-10-CM | POA: Insufficient documentation

## 2016-04-16 DIAGNOSIS — J986 Disorders of diaphragm: Secondary | ICD-10-CM | POA: Diagnosis not present

## 2016-04-16 DIAGNOSIS — S59901A Unspecified injury of right elbow, initial encounter: Secondary | ICD-10-CM | POA: Diagnosis not present

## 2016-04-16 DIAGNOSIS — S7001XA Contusion of right hip, initial encounter: Secondary | ICD-10-CM | POA: Insufficient documentation

## 2016-04-16 DIAGNOSIS — W1830XA Fall on same level, unspecified, initial encounter: Secondary | ICD-10-CM | POA: Diagnosis not present

## 2016-04-16 DIAGNOSIS — M7989 Other specified soft tissue disorders: Secondary | ICD-10-CM | POA: Diagnosis not present

## 2016-04-16 MED ORDER — ALBUTEROL SULFATE HFA 108 (90 BASE) MCG/ACT IN AERS: 2.0000 | INHALATION_SPRAY | RESPIRATORY_TRACT | 1 refills | Status: DC | PRN

## 2016-04-16 MED ORDER — DOXYCYCLINE HYCLATE 100 MG PO CAPS: 100.0000 mg | ORAL_CAPSULE | Freq: Two times a day (BID) | ORAL | 0 refills | Status: DC

## 2016-04-16 MED ORDER — PREDNISONE 20 MG PO TABS: 20.0000 mg | ORAL_TABLET | Freq: Every day | ORAL | 0 refills | Status: DC

## 2016-04-16 MED FILL — VENTOLIN HFA 90 MCG INHALER: 108 (90 BAS | 25 days supply | Qty: 18 | Fill #0

## 2016-04-16 MED FILL — DOXYCYCLINE HYC 100 MG CAP: 100 | 10 days supply | Qty: 20 | Fill #0

## 2016-04-16 MED FILL — predniSONE 20 MG TABS: 20 | 5 days supply | Qty: 5 | Fill #0

## 2016-04-16 NOTE — Progress Notes (Signed)
[name] at [facility], [address] [place], [place] [address] [phone]  Date:  04/16/2016   Name:  [name]   DOB:  Jan 16, 1941   MRN:  [id number]  PCP:  Margaret Homans, MD    Chief Complaint: [name] and Right hip pain )   History of Present Illness:  [name] is a 76 y.o. very pleasant female patient who presents with the following:  Pt of [name]- history of CHF, chronic bronchitis, hypoxia (she uses oxygen) Here today following a fall that occurred on Saturday - today is Wednesday She fell while out of town in [place]- she was at a birthday party and tripped while walking on concrete- she fell onto her right elbow and her right hip She has a large bruise of her right arm Her posterior elbow is tender but otherwise able to move and use her arm normally.  She was worried that the bruise of her arm might turn into "a blood clot."  She also bruised her hip and this is sore but she is able to walk ok  Wt Readings from Last 3 Encounters:  04/16/16 224 lb 12.8 oz (102 kg)  04/07/16 225 lb 8.3 oz (102.3 kg)  04/07/16 220 lb (99.8 kg)   She notes this am that her chest seemed more congested than her baseline. She did not notice any fever.  She does have a productive cough however which seems to be getting worse.  She feels like she may be getting another COPD exacerbation  He has been on oxygen for about 2 years, this is stable   Per her notes she had prednisone 11/2015, 12/2015, 02/14/16 and 04/07/16- these were all short term courses  Her weight is stable She is not on any blood thinners except aspirin  Patient Active Problem List   Diagnosis Date Noted  . Diastolic heart failure ([place]) 04/07/2016  . Chronic bronchitis ([place]) 03/06/2016  . Polymyalgia rheumatica ([place]) 01/07/2016  . Pedal edema 08/28/2015  . Chronic respiratory failure ([place]) 06/28/2015  . Macular degeneration 04/10/2015  . Fall at home  12/04/2014  . Hypercalcemia 10/15/2014  . Hypoxia 10/15/2014  . Anemia 03/12/2014  . Cervical cancer screening 02/28/2014  . Eustachian tube dysfunction 01/29/2014  . Medicare annual wellness visit, subsequent 12/04/2013  . Constipation 06/05/2013  . Tachycardia 02/23/2013  . Sun-damaged skin 10/24/2012  . UTI (lower urinary tract infection) 10/24/2012  . Back pain 04/07/2011  . Allergic rhinitis 05/23/2009  . Gout 01/11/2008  . VARICOSE VEINS, LOWER EXTREMITIES 06/01/2007  . ADJ DISORDER WITH MIXED ANXIETY & DEPRESSED MOOD 03/25/2007  . Hyperlipidemia, mixed 12/29/2006  . Essential hypertension 12/29/2006  . Osteopenia 12/29/2006  . PELVIC PAIN, RIGHT 12/29/2006  . Diabetes type 2, controlled ([place]) 12/29/2006  . Asthma, moderate persistent 08/21/2006  . GERD 08/21/2006  . OSTEOARTHRITIS 08/21/2006  . DVT, HX OF 08/21/2006    Past Medical History:  Diagnosis Date  . Abnormal glucose tolerance test   . ACE-inhibitor cough   . Anemia 03/12/2014  . Arthritis   . Asthma    PFT 02/06/09 FEV1 1.41 (65%), FVC 1.92 (64), FEV1% 74, TLC 3.47 (71%), DLCO 48%, +BD  . Atypical chest pain    s/p cath, Normal coronaries, Non ST elevation myocardial infarction, Rt groin pseudoaneurysm  . Bacterial vaginosis 03/12/2014  . COPD (chronic obstructive pulmonary disease) ([place])   . Depression   . Diastolic heart failure ([place])  04/07/2016  . DVT (deep venous thrombosis) ([place]) 1987  . Fall at home 12/04/2014  . GERD (gastroesophageal reflux disease)   . Gout   . Hypercalcemia 10/15/2014  . Hyperlipidemia   . Hypertension   . Hypoxia 10/15/2014  . Macular degeneration 04/10/2015  . Osteopenia 12/29/2006   Qualifier: Diagnosis of  By: Wynona Luna   . Pneumonia   . Polymyalgia rheumatica ([place]) 01/07/2016  . Sun-damaged skin 10/24/2012  . Unspecified constipation 06/05/2013  . Urinary frequency 10/24/2012  . UTI (lower urinary tract infection) 10/24/2012  . Vitamin D deficiency 01/01/2015     Past Surgical History:  Procedure Laterality Date  . APPENDECTOMY  1951  . TONSILLECTOMY  1950  . TUBAL LIGATION  1968    Social History  Substance Use Topics  . Smoking status: Never Smoker  . Smokeless tobacco: Never Used  . Alcohol use No    Family History  Problem Relation Age of Onset  . Asthma Sister   . Arthritis Sister   . Hypertension Sister   . Hyperlipidemia Sister   . Cancer Sister 13    uterine  . Uterine cancer Sister   . Coronary artery disease Brother     x2  . Cancer Brother     lung, smoker  . Arthritis Brother   . Hypertension Sister   . Arthritis Sister   . Hyperlipidemia Sister   . Emphysema Sister   . COPD Sister     smoker  . Heart disease Sister   . Hyperlipidemia Sister   . Hypertension Sister   . Arthritis Sister   . Emphysema Brother   . Heart disease Brother     MI smoker  . Mental illness Father   . Alcohol abuse Father   . Heart disease Mother     MI  . Hyperlipidemia Mother   . Epilepsy Daughter   . Hypertension Daughter   . Obesity Daughter   . COPD Brother     smoker  . Cancer Brother     lung  . Coronary artery disease Other   . Colon polyps Sister     Allergies  Allergen Reactions  . Oseltamivir Phosphate     REACTION: nausea, vomiting, diarrhea, dizziness  . Sulfa Antibiotics Other (See Comments)    CP  . Ace Inhibitors   . Indomethacin   . Montelukast Sodium     REACTION: Heart palpitations, chest pain  . Sulfonamide Derivatives     Medication list has been reviewed and updated.  Current Outpatient Prescriptions on File Prior to Visit  Medication Sig Dispense Refill  . acetaminophen (TYLENOL) 500 MG tablet Take 500 mg by mouth every 6 (six) hours as needed.    Marland Kitchen albuterol (ACCUNEB) 0.63 MG/3ML nebulizer solution USE ONE VIAL IN NEBULIZER EVERY 6 HOURS AS NEEDED 150 mL 1  . allopurinol (ZYLOPRIM) 100 MG tablet Take 2 tablets (200 mg total) by mouth daily. 180 tablet 1  . aspirin EC 81 MG tablet Take  1 tablet (81 mg total) by mouth daily.    Marland Kitchen b complex vitamins tablet Take 1 tablet by mouth daily.    . benzonatate (TESSALON) 100 MG capsule Take 1 capsule (100 mg total) by mouth 3 (three) times daily as needed for cough. 21 capsule 0  . budesonide-formoterol (SYMBICORT) 160-4.5 MCG/ACT inhaler INHALE 2 PUFFS INTO THE LUNGS 2 TIMES DAILY 3 Inhaler 1  . Cyanocobalamin (VITAMIN B-12 PO) Take by mouth daily.    Marland Kitchen  famotidine (PEPCID) 20 MG tablet Take 1 tablet (20 mg total) by mouth at bedtime. 30 tablet 0  . Ferrous Fumarate-Folic Acid (HEMOCYTE-F) 324-1 MG TABS Take 1 tablet by mouth daily. 30 each 2  . fluticasone (FLONASE) 50 MCG/ACT nasal spray Place 1 spray into both nostrils daily. 16 g 3  . furosemide (LASIX) 20 MG tablet 1 tab po daily and a 2nd tab po qd prn edema, weight gain or sob 45 tablet 3  . glucose blood (ONE TOUCH TEST STRIPS) test strip Use as instructed to check blood sugar once weekly. DX 790.29     . loratadine (CLARITIN) 10 MG tablet Take 1 tablet (10 mg total) by mouth at bedtime. 30 tablet 5  . losartan (COZAAR) 25 MG tablet TAKE 1 TABLET EVERY DAY 90 tablet 1  . metoprolol succinate (TOPROL-XL) 50 MG 24 hr tablet Take 1 tablet (50 mg total) by mouth daily. Take with or immediately following a meal. 90 tablet 3  . montelukast (SINGULAIR) 10 MG tablet Take 1 tablet (10 mg total) by mouth at bedtime as needed. 30 tablet 3  . Niacin (VITAMIN B-3 PO) Take 1 tablet by mouth daily.    . OXYGEN Inhale into the lungs.    . pantoprazole (PROTONIX) 40 MG tablet TAKE 1 TABLET TWICE DAILY 180 tablet 1  . potassium chloride SA (K-DUR,KLOR-CON) 20 MEQ tablet TAKE 1 TABLET EVERY DAY 90 tablet 2  . pravastatin (PRAVACHOL) 20 MG tablet TAKE 1 TABLET EVERY DAY 90 tablet 1  . Probiotic Product (PROBIOTIC PO) Take by mouth daily.    . ranitidine (ZANTAC) 300 MG tablet TAKE 1 TABLET AT BEDTIME 90 tablet 3  . sertraline (ZOLOFT) 100 MG tablet Take 1 tablet (100 mg total) by mouth daily. 30  tablet 3  . SPIRIVA HANDIHALER 18 MCG inhalation capsule PLACE 1 CAPSULE (18 MCG TOTAL) INTO INHALER AND INHALE CONTENTS ONE TIME DAILY. 90 capsule 1  . traMADol (ULTRAM) 50 MG tablet TAKE 1 TABLET THREE TIMES DAILY AS NEEDED 90 tablet 0   No current facility-administered medications on file prior to visit.     Review of Systems:  As per HPI- otherwise negative. She did not hit her head or have any neck injury when she fell She is able to walk ok on her hip She has been on oxygen for about 2 years   BP Readings from Last 3 Encounters:  04/16/16 (!) 113/56  04/07/16 124/73  04/07/16 (!) 142/72     Physical Examination: Vitals:   04/16/16 1606  BP: (!) 113/56  Pulse: 87  Temp: 98.1 F (36.7 C)   Vitals:   04/16/16 1606  Weight: 224 lb 12.8 oz (102 kg)  Height: 5\' 5"  (1.651 m)   Body mass index is 37.41 kg/m. Ideal Body Weight: Weight in (lb) to have BMI = 25: 149.9  GEN: WDWN, NAD, Non-toxic, A & O x 3, obese, looks well but has a large bruise apparent on her right arm HEENT: Atraumatic, Normocephalic. Neck supple. No masses, No LAD. Ears and Nose: No external deformity. CV: RRR, No M/G/R. No JVD. No thrill. No extra heart sounds. PULM: CTA B, no wheezes, crackles, rhonchi. No retractions. No resp. distress. No accessory muscle use. ABD: S, NT, ND EXTR: No c/c.  She has LE edema which is baseline per her report  NEURO Normal gait.  PSYCH: Normally interactive. Conversant. Not depressed or anxious appearing.  Calm demeanor.  Right elbow:  She has a large bruise  mostly on the dorsal aspect of her left forearm from the elbow to the wrist.  No pain with pronation/ supination of the elbow.  Mild pain with full extension.  Normal manipulation of the wrist and hand except for bruising.  Humerus and shoulder are normal  Right hip: she has a large bruise over the lateral aspect of the right hip which is tender to touch, but she has no pain with ROM of the hip joint and is able to  walk ok  Dg Chest 2 View  Result Date: 04/16/2016 CLINICAL DATA:  Shortness of breath and wheezing.  Recent fall. EXAM: CHEST  2 VIEW COMPARISON:  Two-view chest x-ray 04/07/2016. FINDINGS: The heart is enlarged. The central pulmonary vascular congestion is somewhat prior exam. There is mild dependent atelectasis. Elevation of the left hemidiaphragm remains. No significant effusions are present. Degenerative changes of the thoracic spine are again noted. IMPRESSION: 1. Stable mild cardiomegaly and pulmonary vascular congestion. 2. Chronic elevation of the left hemidiaphragm. Electronically Signed   By: San Morelle M.D.   On: 04/16/2016 16:58   Dg Chest 2 View  Result Date: 04/07/2016 CLINICAL DATA:  Increased shortness of breath and wheezing EXAM: CHEST  2 VIEW COMPARISON:  03/06/2016 FINDINGS: Elevated left diaphragm as before. Stable cardiomegaly with central vascular congestion. No acute infiltrate or effusion. No pneumothorax. Degenerative changes of the AC joints with rotator cuff disease on the right. IMPRESSION: Cardiomegaly with central vascular congestion. No acute infiltrate or edema Electronically Signed   By: Donavan Foil M.D.   On: 04/07/2016 17:03   Dg Elbow Complete Right  Result Date: 04/16/2016 CLINICAL DATA:  Fall on right elbow.  Right elbow pain. EXAM: RIGHT ELBOW - COMPLETE 3+ VIEW COMPARISON:  None. FINDINGS: Soft swelling is noted along the dorsal aspect of the right elbow adjacent to the proximal ulna. Soft swelling is worse along the medial aspect. There is no underlying fracture. There is no joint effusion. The elbow is located. Minimal degenerative changes are noted. IMPRESSION: 1. Soft tissue swelling with hematoma and/or distal to the elbow without underlying fracture or dislocation. Electronically Signed   By: San Morelle M.D.   On: 04/16/2016 16:56   Dg Hip Unilat W Or W/o Pelvis 2-3 Views Right  Result Date: 04/16/2016 CLINICAL DATA:  Initial  evaluation for acute contusion of right hip. EXAM: DG HIP (WITH OR WITHOUT PELVIS) 2-3V RIGHT COMPARISON:  None. FINDINGS: No acute fracture or dislocation. Femoral heads in normal line within the acetabula. Bony pelvis intact. SI joints approximated. Mild to moderate degenerative osteoarthritic changes present about the hips. Degenerative changes noted within the lower lumbar spine. No appreciable soft tissue abnormality. IMPRESSION: No acute fracture or dislocation. Electronically Signed   By: Jeannine Boga M.D.   On: 04/16/2016 17:10    Assessment and Plan: Contusion of right elbow, initial encounter - Plan: DG Elbow Complete Right  Contusion of right hip, initial encounter - Plan: DG HIP UNILAT W OR W/O PELVIS 2-3 VIEWS RIGHT  Shortness of breath - Plan: DG Chest 2 View  COPD exacerbation (HCC) - Plan: doxycycline (VIBRAMYCIN) 100 MG capsule, predniSONE (DELTASONE) 20 MG tablet, albuterol (PROAIR HFA) 108 (90 Base) MCG/ACT inhaler  Here today following a fall that occurred 4 days ago.  She has contusions and bruising of her right arm and hip, but no apparent fracture,.  She will take it easy and let me know if not getting back to her baseline She has O2 dependent lung  disease and seems to be developing an exacerbation Will treat her with doxycycline and a short course of prednisone  Meds ordered this encounter  Medications  . doxycycline (VIBRAMYCIN) 100 MG capsule    Sig: Take 1 capsule (100 mg total) by mouth 2 (two) times daily.    Dispense:  20 capsule    Refill:  0  . predniSONE (DELTASONE) 20 MG tablet    Sig: Take 1 tablet (20 mg total) by mouth daily with breakfast.    Dispense:  5 tablet    Refill:  0  . albuterol (PROAIR HFA) 108 (90 Base) MCG/ACT inhaler    Sig: Inhale 2 puffs into the lungs every 4 (four) hours as needed.    Dispense:  1 Inhaler    Refill:  1   She will let me know if her breathing is not back to her baseline soon  Signed Lamar Blinks,  MD

## 2016-04-16 NOTE — Progress Notes (Signed)
Pre visit review using our clinic tool,if applicable. No additional management support is needed unless otherwise documented below in the visit note.  

## 2016-04-16 NOTE — Patient Instructions (Signed)
I am sorry that you fell and hurt!  luckily there does not appear to be any fracture., but please let me know if your elbow and hip are not improving.   We are going to treat you for a COPD exacerbation with  5 days of prednisone 10 days of antibiotic (doxycycline) Continue your inhalers as needed and let me know if you have any concerns

## 2016-04-20 DIAGNOSIS — J4520 Mild intermittent asthma, uncomplicated: Secondary | ICD-10-CM | POA: Diagnosis not present

## 2016-04-20 DIAGNOSIS — R0902 Hypoxemia: Secondary | ICD-10-CM | POA: Diagnosis not present

## 2016-04-25 ENCOUNTER — Ambulatory Visit (INDEPENDENT_AMBULATORY_CARE_PROVIDER_SITE_OTHER): Payer: Medicare HMO | Admitting: Cardiology

## 2016-04-25 VITALS — BP 130/70 | HR 90 | Ht 65.0 in | Wt 227.5 lb

## 2016-04-25 DIAGNOSIS — R600 Localized edema: Secondary | ICD-10-CM

## 2016-04-25 DIAGNOSIS — I5032 Chronic diastolic (congestive) heart failure: Secondary | ICD-10-CM

## 2016-04-25 DIAGNOSIS — I10 Essential (primary) hypertension: Secondary | ICD-10-CM

## 2016-04-25 DIAGNOSIS — R011 Cardiac murmur, unspecified: Secondary | ICD-10-CM | POA: Insufficient documentation

## 2016-04-25 MED ORDER — FUROSEMIDE 40 MG PO TABS: 40.0000 mg | ORAL_TABLET | Freq: Every day | ORAL | 3 refills | Status: DC

## 2016-04-25 MED FILL — FUROSEMIDE 40 MG TABLET: 40 | 90 days supply | Qty: 90 | Fill #0

## 2016-04-25 NOTE — Progress Notes (Signed)
Cardiology Office Note    Date:  04/25/2016   ID:  [name], DOB 04-Jun-1940, MRN [id number]  PCP:  Margaret Homans, MD  Cardiologist:  Margaret Him, MD   Chief Complaint  Patient presents with  . Congestive Heart Failure    History of Present Illness:  [name] is a 76 y.o. female with a history of asthma, atypical CP with normal coronary arteries by cath in 2008 in the setting of NSTEMI complicated by right groin pseudoaneurysm, chronic diastolic CHF, GERD, HTN and hyperlipidemia.  She has a history of DVT in the past as well as O2 dependent COPD on 2L [place].  She has been followed in the past by Dr. Acie Fredrickson.  She is referred for evaluation by her PCP, Margaret Homans, MD for evaluation of diastolic CHF.  Her last echo was in 2016 which showed mild LV dysfunction with ER 11-94% with diastolic dysfunction.  She has not followed up with a Cardiologist since 2015.  She says that she has had some problems with LE edema for a while but in the last month the swelling has gotten.  The swelling improves during the night but as soon as she gets up they start swelling.  She does use a salt shaker, eats canned foods, frozen dinner and luncheon meat.  She denies any chest pain or pressure with exertion but sometimes at night she will get a sharp stabbing pain in her left breast at rest and resolves with belching.  She has chronic SOB due to COPD and this has been stable.  She denies any PND or orthopnea.  She occasionally has some dizziness when standing up too fast.  She denies any palpitations.   She takes Lasix 20mg  daily and occasionally will take an extra dose.      Past Medical History:  Diagnosis Date  . Abnormal glucose tolerance test   . ACE-inhibitor cough   . Anemia 03/12/2014  . Arthritis   . Asthma    PFT 02/06/09 FEV1 1.41 (65%), FVC 1.92 (64), FEV1% 74, TLC 3.47 (71%), DLCO 48%, +BD  . Atypical chest pain    s/p cath, Normal coronaries, Non ST elevation myocardial infarction, Rt  groin pseudoaneurysm  . Bacterial vaginosis 03/12/2014  . COPD (chronic obstructive pulmonary disease) ([place])   . Depression   . Diastolic heart failure ([place]) 04/07/2016  . DVT (deep venous thrombosis) ([place]) 1987  . Fall at home 12/04/2014  . GERD (gastroesophageal reflux disease)   . Gout   . Hypercalcemia 10/15/2014  . Hyperlipidemia   . Hypertension   . Hypoxia 10/15/2014  . Macular degeneration 04/10/2015  . Osteopenia 12/29/2006   Qualifier: Diagnosis of  By: Wynona Luna   . Pneumonia   . Polymyalgia rheumatica ([place]) 01/07/2016  . Sun-damaged skin 10/24/2012  . Unspecified constipation 06/05/2013  . Urinary frequency 10/24/2012  . UTI (lower urinary tract infection) 10/24/2012  . Vitamin D deficiency 01/01/2015    Past Surgical History:  Procedure Laterality Date  . APPENDECTOMY  1951  . TONSILLECTOMY  1950  . TUBAL LIGATION  1968    Current Medications: No outpatient prescriptions have been marked as taking for the 04/25/16 encounter (Office Visit) with Margaret Margarita, MD.    Allergies:   Oseltamivir phosphate; Sulfa antibiotics; Ace inhibitors; Indomethacin; Montelukast sodium; and Sulfonamide derivatives   Social History   Social History  . Marital status: Widowed    Spouse name: N/A  . Number of children: 1  .  Years of education: N/A   Occupational History  . Retired [employer]   Social History Main Topics  . Smoking status: Never Smoker  . Smokeless tobacco: Never Used  . Alcohol use No  . Drug use: No  . Sexual activity: No     Comment: lives alone, no dietary restrictions   Other Topics Concern  . Not on file   Social History Narrative  . No narrative on file     Family History:  The patient's family history includes Alcohol abuse in her father; Arthritis in her brother, sister, sister, and sister; Asthma in her sister; COPD in her brother and sister; Cancer in her brother and brother; Cancer (age of onset: 47) in her sister; Colon polyps in her  sister; Coronary artery disease in her brother and other; Emphysema in her brother and sister; Epilepsy in her daughter; Heart disease in her brother, mother, and sister; Hyperlipidemia in her mother, sister, sister, and sister; Hypertension in her daughter, sister, sister, and sister; Mental illness in her father; Obesity in her daughter; Uterine cancer in her sister.   ROS:   Please see the history of present illness.    ROS All other systems reviewed and are negative.  No flowsheet data found.     PHYSICAL EXAM:   VS:  BP 130/70   Pulse 90   Ht 5\' 5"  (1.651 m)   Wt 227 lb 8 oz (103.2 kg)   SpO2 93%   BMI 37.86 kg/m    GEN: Well nourished, well developed, in no acute distress  HEENT: normal  Neck: no JVD, carotid bruits, or masses Cardiac: RRR; no rubs, or gallops.  Marked LE edema.  Intact distal pulses bilaterally. 2/6 SM at RUSB Respiratory:  clear to auscultation bilaterally, normal work of breathing GI: soft, nontender, nondistended, + BS MS: no deformity or atrophy  Skin: warm and dry, no rash Neuro:  Alert and Oriented x 3, Strength and sensation are intact Psych: euthymic mood, full affect  Wt Readings from Last 3 Encounters:  04/25/16 227 lb 8 oz (103.2 kg)  04/16/16 224 lb 12.8 oz (102 kg)  04/07/16 225 lb 8.3 oz (102.3 kg)      Studies/Labs Reviewed:   EKG:  EKG is ordered today.  The ekg ordered today demonstrates NSR with PVCs  Recent Labs: 12/20/2015: Pro B Natriuretic peptide (BNP) 45.0 04/07/2016: ALT 10; BUN 19; Creatinine, Ser 1.27; Hemoglobin 10.7; Platelets 159.0; Potassium 4.6; Sodium 140; TSH 4.77   Lipid Panel    Component Value Date/Time   CHOL 128 04/07/2016 1428   TRIG 130.0 04/07/2016 1428   TRIG 122 01/27/2006 1037   HDL 51.80 04/07/2016 1428   CHOLHDL 2 04/07/2016 1428   VLDL 26.0 04/07/2016 1428   LDLCALC 50 04/07/2016 1428   LDLDIRECT 142.7 12/16/2005 1010    Additional studies/ records that were reviewed today include:    Office notes from PCP    ASSESSMENT:    1. Chronic diastolic CHF (congestive heart failure) ([place])   2. Essential hypertension   3. Pedal edema      PLAN:  In order of problems listed above:  1. Chronic diastolic CHF - her last echo was in 2016 at which time Ef was mildly reduced at 45-50%.  I will repeat an echo to assess LVF as well as evaluate for Right sided HF.   2. HTN - BP controlled on current meds. She will continue on ARB and BB. 3. Pedal edema - she  has marked LE edema which is likely multifactorial from morbid obesity, sedentary state, dietary noncompliance with sodium and possibly right sided heart failure with cor pulmonale from her COPD.  I am going to get an echo to assess RV function.  I will increase Lasix to 40mg  BID for 3 days and then 40mg  daily.   4. Heart murmur - I will get an echo to assess.    Medication Adjustments/Labs and Tests Ordered: Current medicines are reviewed at length with the patient today.  Concerns regarding medicines are outlined above.  Medication changes, Labs and Tests ordered today are listed in the Patient Instructions below.  There are no Patient Instructions on file for this visit.   Signed, Margaret Him, MD  04/25/2016 2:07 PM    [address], [place], [place]  [address] Phone: [phone]; Fax: [phone]

## 2016-04-25 NOTE — Patient Instructions (Signed)
Medication Instructions:  1) INCREASE LASIX to 40 mg TWICE DAILY for 3 days then take 40 mg daily.  Labwork: Your physician recommends that you return for lab work in [place] ([facility]).  Testing/Procedures: Your physician has requested that you have an echocardiogram. Echocardiography is a painless test that uses sound waves to create images of your heart. It provides your doctor with information about the size and shape of your heart and how well your heart's chambers and valves are working. This procedure takes approximately one hour. There are no restrictions for this procedure.  Follow-Up: Your physician recommends that you schedule a follow-up appointment in 2 WEEKS with Dr. Theodosia Blender assistant.  Your physician recommends that you schedule a follow-up appointment in 3 MONTHS with Dr. Radford Pax.   Any Other Special Instructions Will Be Listed Below (If Applicable).     If you need a refill on your cardiac medications before your next appointment, please call your pharmacy.

## 2016-04-29 ENCOUNTER — Other Ambulatory Visit (INDEPENDENT_AMBULATORY_CARE_PROVIDER_SITE_OTHER): Payer: Medicare HMO

## 2016-04-29 DIAGNOSIS — R609 Edema, unspecified: Secondary | ICD-10-CM

## 2016-04-29 DIAGNOSIS — E119 Type 2 diabetes mellitus without complications: Secondary | ICD-10-CM | POA: Diagnosis not present

## 2016-04-29 LAB — COMPREHENSIVE METABOLIC PANEL
ALT: 10 U/L (ref 0–35)
AST: 14 U/L (ref 0–37)
Albumin: 3.9 g/dL (ref 3.5–5.2)
Alkaline Phosphatase: 49 U/L (ref 39–117)
BILIRUBIN TOTAL: 0.7 mg/dL (ref 0.2–1.2)
BUN: 27 mg/dL — AB (ref 6–23)
CO2: 31 mEq/L (ref 19–32)
Calcium: 9.7 mg/dL (ref 8.4–10.5)
Chloride: 98 mEq/L (ref 96–112)
Creatinine, Ser: 2.17 mg/dL — ABNORMAL HIGH (ref 0.40–1.20)
GFR: 23.49 mL/min — AB (ref >60.00)
Glucose, Bld: 176 mg/dL — ABNORMAL HIGH (ref 70–99)
POTASSIUM: 3.8 meq/L (ref 3.5–5.1)
Sodium: 140 mEq/L (ref 135–145)
Total Protein: 6.9 g/dL (ref 6.0–8.3)

## 2016-05-01 ENCOUNTER — Other Ambulatory Visit: Payer: Self-pay | Admitting: Family Medicine

## 2016-05-01 DIAGNOSIS — N289 Disorder of kidney and ureter, unspecified: Secondary | ICD-10-CM

## 2016-05-02 ENCOUNTER — Other Ambulatory Visit: Payer: Medicare HMO | Admitting: *Deleted

## 2016-05-02 DIAGNOSIS — I5032 Chronic diastolic (congestive) heart failure: Secondary | ICD-10-CM | POA: Diagnosis not present

## 2016-05-03 LAB — BASIC METABOLIC PANEL
BUN / CREAT RATIO: 16 (ref 12–28)
BUN: 33 mg/dL — ABNORMAL HIGH (ref 8–27)
CHLORIDE: 96 mmol/L (ref 96–106)
CO2: 28 mmol/L (ref 18–29)
CREATININE: 2.05 mg/dL — AB (ref 0.57–1.00)
Calcium: 10.5 mg/dL — ABNORMAL HIGH (ref 8.7–10.3)
GFR, EST AFRICAN AMERICAN: 27 mL/min/{1.73_m2} — AB (ref >59)
GFR, EST NON AFRICAN AMERICAN: 23 mL/min/{1.73_m2} — AB (ref >59)
Glucose: 161 mg/dL — ABNORMAL HIGH (ref 65–99)
Potassium: 4.3 mmol/L (ref 3.5–5.2)
Sodium: 140 mmol/L (ref 134–144)

## 2016-05-06 ENCOUNTER — Telehealth: Payer: Self-pay

## 2016-05-06 DIAGNOSIS — I10 Essential (primary) hypertension: Secondary | ICD-10-CM

## 2016-05-06 MED ORDER — METOPROLOL SUCCINATE ER 50 MG PO TB24: ORAL_TABLET | ORAL | 3 refills | Status: DC

## 2016-05-06 NOTE — Telephone Encounter (Signed)
-----   Message from Sueanne Margarita, MD sent at 05/04/2016  1:44 PM EDT ----- Creatinine too high and needs diuretics.  Please stop losartan and increase Toprol to 75mg  daily.  Followup in HTN clinic in 1 week [facility] at same time

## 2016-05-06 NOTE — Telephone Encounter (Signed)
Informed patient of results and verbal understanding expressed.  Instructed patient to STOP LOSARTAN and INCREASE TOPROL to 75 mg daily. She will be seen by B. Rosita Fire, PA next week. She understands she will have labs drawn that day. She agrees with treatment plan.

## 2016-05-08 ENCOUNTER — Ambulatory Visit: Payer: Self-pay | Admitting: Rheumatology

## 2016-05-09 ENCOUNTER — Ambulatory Visit (HOSPITAL_COMMUNITY): Payer: Medicare HMO | Attending: Cardiology

## 2016-05-09 ENCOUNTER — Other Ambulatory Visit: Payer: Self-pay

## 2016-05-09 DIAGNOSIS — I5032 Chronic diastolic (congestive) heart failure: Secondary | ICD-10-CM | POA: Insufficient documentation

## 2016-05-09 DIAGNOSIS — I340 Nonrheumatic mitral (valve) insufficiency: Secondary | ICD-10-CM | POA: Insufficient documentation

## 2016-05-09 DIAGNOSIS — I420 Dilated cardiomyopathy: Secondary | ICD-10-CM | POA: Diagnosis not present

## 2016-05-14 ENCOUNTER — Encounter: Payer: Self-pay | Admitting: Cardiology

## 2016-05-14 ENCOUNTER — Other Ambulatory Visit: Payer: Medicare HMO

## 2016-05-14 ENCOUNTER — Ambulatory Visit (INDEPENDENT_AMBULATORY_CARE_PROVIDER_SITE_OTHER): Payer: Medicare HMO | Admitting: Cardiology

## 2016-05-14 VITALS — BP 116/62 | HR 75 | Ht 65.0 in | Wt 222.6 lb

## 2016-05-14 DIAGNOSIS — I5032 Chronic diastolic (congestive) heart failure: Secondary | ICD-10-CM | POA: Diagnosis not present

## 2016-05-14 DIAGNOSIS — Z79899 Other long term (current) drug therapy: Secondary | ICD-10-CM | POA: Diagnosis not present

## 2016-05-14 DIAGNOSIS — I10 Essential (primary) hypertension: Secondary | ICD-10-CM

## 2016-05-14 NOTE — Patient Instructions (Signed)
Your physician recommends that you continue on your current medications as directed. Please refer to the Current Medication list given to you today.  Your physician recommends that you return for lab work TODAY - BMET  Weigh daily. Call [phone] if weight climbs more than 3 pounds in a day or 5 pounds in a week. -- if you gain more than 3lbs in one day - take extra lasix  No salt to very little salt in your diet - No more than 2000 mg in a day. Call if increased shortness of breath or increased swelling.    DASH Eating Plan DASH stands for "Dietary Approaches to Stop Hypertension." The DASH eating plan is a healthy eating plan that has been shown to reduce high blood pressure (hypertension). It may also reduce your risk for type 2 diabetes, heart disease, and stroke. The DASH eating plan may also help with weight loss. What are tips for following this plan? General guidelines   Avoid eating more than 2,000 mg (milligrams) of salt (sodium) a day. If you have hypertension, you may need to reduce your sodium intake to 1,500 mg a day.  Limit alcohol intake to no more than 1 drink a day for nonpregnant women and 2 drinks a day for men. One drink equals 12 oz of beer, 5 oz of wine, or 1 oz of hard liquor.  Work with your health care provider to maintain a healthy body weight or to lose weight. Ask what an ideal weight is for you.  Get at least 30 minutes of exercise that causes your heart to beat faster (aerobic exercise) most days of the week. Activities may include walking, swimming, or biking.  Work with your health care provider or diet and nutrition specialist (dietitian) to adjust your eating plan to your individual calorie needs. Reading food labels   Check food labels for the amount of sodium per serving. Choose foods with less than 5 percent of the Daily Value of sodium. Generally, foods with less than 300 mg of sodium per serving fit into this eating plan.  To find whole  grains, look for the word "whole" as the first word in the ingredient list. Shopping   Buy products labeled as "low-sodium" or "no salt added."  Buy fresh foods. Avoid canned foods and premade or frozen meals. Cooking   Avoid adding salt when cooking. Use salt-free seasonings or herbs instead of table salt or sea salt. Check with your health care provider or pharmacist before using salt substitutes.  Do not fry foods. Cook foods using healthy methods such as baking, boiling, grilling, and broiling instead.  Cook with heart-healthy oils, such as olive, canola, soybean, or sunflower oil. Meal planning    Eat a balanced diet that includes:  5 or more servings of fruits and vegetables each day. At each meal, try to fill half of your plate with fruits and vegetables.  Up to 6-8 servings of whole grains each day.  Less than 6 oz of lean meat, poultry, or fish each day. A 3-oz serving of meat is about the same size as a deck of cards. One egg equals 1 oz.  2 servings of low-fat dairy each day.  A serving of nuts, seeds, or beans 5 times each week.  Heart-healthy fats. Healthy fats called Omega-3 fatty acids are found in foods such as flaxseeds and coldwater fish, like sardines, salmon, and mackerel.  Limit how much you eat of the following:  Canned or prepackaged foods.  Food that is high in trans fat, such as fried foods.  Food that is high in saturated fat, such as fatty meat.  Sweets, desserts, sugary drinks, and other foods with added sugar.  Full-fat dairy products.  Do not salt foods before eating.  Try to eat at least 2 vegetarian meals each week.  Eat more home-cooked food and less restaurant, buffet, and fast food.  When eating at a restaurant, ask that your food be prepared with less salt or no salt, if possible. What foods are recommended? The items listed may not be a complete list. Talk with your dietitian about what dietary choices are best for you. Grains   Whole-grain or whole-wheat bread. Whole-grain or whole-wheat pasta. Brown rice. [place]ena Morrow. Bulgur. Whole-grain and low-sodium cereals. Pita bread. Low-fat, low-sodium crackers. Whole-wheat flour tortillas. Vegetables  Fresh or frozen vegetables (raw, steamed, roasted, or grilled). Low-sodium or reduced-sodium tomato and vegetable juice. Low-sodium or reduced-sodium tomato sauce and tomato paste. Low-sodium or reduced-sodium canned vegetables. Fruits  All fresh, dried, or frozen fruit. Canned fruit in natural juice (without added sugar). Meat and other protein foods  Skinless chicken or [place]. Ground chicken or [place]. Pork with fat trimmed off. Fish and seafood. Egg whites. Dried beans, peas, or lentils. Unsalted nuts, nut butters, and seeds. Unsalted canned beans. Lean cuts of beef with fat trimmed off. Low-sodium, lean deli meat. Dairy  Low-fat (1%) or fat-free (skim) milk. Fat-free, low-fat, or reduced-fat cheeses. Nonfat, low-sodium ricotta or cottage cheese. Low-fat or nonfat yogurt. Low-fat, low-sodium cheese. Fats and oils  Soft margarine without trans fats. Vegetable oil. Low-fat, reduced-fat, or light mayonnaise and salad dressings (reduced-sodium). Canola, safflower, olive, soybean, and sunflower oils. Avocado. Seasoning and other foods  Herbs. Spices. Seasoning mixes without salt. Unsalted popcorn and pretzels. Fat-free sweets. What foods are not recommended? The items listed may not be a complete list. Talk with your dietitian about what dietary choices are best for you. Grains  Baked goods made with fat, such as croissants, muffins, or some breads. Dry pasta or rice meal packs. Vegetables  Creamed or fried vegetables. Vegetables in a cheese sauce. Regular canned vegetables (not low-sodium or reduced-sodium). Regular canned tomato sauce and paste (not low-sodium or reduced-sodium). Regular tomato and vegetable juice (not low-sodium or reduced-sodium). Angie Fava. Olives. Fruits   Canned fruit in a light or heavy syrup. Fried fruit. Fruit in cream or butter sauce. Meat and other protein foods  Fatty cuts of meat. Ribs. Fried meat. Berniece Salines. Sausage. Bologna and other processed lunch meats. Salami. Fatback. Hotdogs. Bratwurst. Salted nuts and seeds. Canned beans with added salt. Canned or smoked fish. Whole eggs or egg yolks. Chicken or [place] with skin. Dairy  Whole or 2% milk, cream, and half-and-half. Whole or full-fat cream cheese. Whole-fat or sweetened yogurt. Full-fat cheese. Nondairy creamers. Whipped toppings. Processed cheese and cheese spreads. Fats and oils  Butter. Stick margarine. Lard. Shortening. Ghee. Bacon fat. Tropical oils, such as coconut, palm kernel, or palm oil. Seasoning and other foods  Salted popcorn and pretzels. Onion salt, garlic salt, seasoned salt, table salt, and sea salt. Worcestershire sauce. Tartar sauce. Barbecue sauce. Teriyaki sauce. Soy sauce, including reduced-sodium. Steak sauce. Canned and packaged gravies. Fish sauce. Oyster sauce. Cocktail sauce. Horseradish that you find on the shelf. Ketchup. Mustard. Meat flavorings and tenderizers. Bouillon cubes. Hot sauce and Tabasco sauce. Premade or packaged marinades. Premade or packaged taco seasonings. Relishes. Regular salad dressings. Where to find more information:  National Heart, Lung, and Blood  Institute: https://wilson-eaton.com/  American Heart Association: www.heart.org Summary  The DASH eating plan is a healthy eating plan that has been shown to reduce high blood pressure (hypertension). It may also reduce your risk for type 2 diabetes, heart disease, and stroke.  With the DASH eating plan, you should limit salt (sodium) intake to 2,300 mg a day. If you have hypertension, you may need to reduce your sodium intake to 1,500 mg a day.  When on the DASH eating plan, aim to eat more fresh fruits and vegetables, whole grains, lean proteins, low-fat dairy, and heart-healthy fats.  Work  with your health care provider or diet and nutrition specialist (dietitian) to adjust your eating plan to your individual calorie needs. This information is not intended to replace advice given to you by your health care provider. Make sure you discuss any questions you have with your health care provider. Document Released: 12/26/2010 Document Revised: 12/31/2015 Document Reviewed: 12/31/2015 Elsevier Interactive Patient Education  2017 [employer].

## 2016-05-14 NOTE — Progress Notes (Signed)
05/14/2016 [name]   [birth year]  [id number]  Primary Physician Penni Homans, MD Primary Cardiologist: Dr. Radford Pax    Reason for Visit/CC: f/u for Diastolic HF  HPI:  [name] is a 76 y.o. female who presents back to clinic for f/u for diastolic HF.   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 Previously followed by Dr. Acie Fredrickson but was lost to f/u for many years. She was referred back to our practice by her PCP recently for bilateral LEE. This was in the setting of dietary indiscretion. She does use a salt shaker, eats canned foods, frozen dinner and luncheon meat. She was seen by Dr. Radford Pax on 04/25/16. She increased her Lasix from 20 mg daily to 40 mg BID x 3 days followed by 40 mg daily. She also ordered a 2D echo. This was performed 05/09/16 and showed normal LVEF 50-55% and grade 1DD. Mild MR also noted.   Of note, Dr. Radford Pax also checked BMP for baseline SCr and K, prior to increasing her diuretics. SCr was 2.05 (previous baseline ~1.4). Dr. Radford Pax ordered for her to stop Losartan and to increase her metoprolol to 75 mg daily. She will need a repeat BMP today. BP is 116/62. HR 75 bpm.   Edema is improved but still with trace edema with chronic stasis dermatitis. She feels much better. No increased dyspnea from her usual baseline. She is stable on 2L [place]. Weight also improved since last OV, 227>>222 today.  Current Meds  Medication Sig  . acetaminophen (TYLENOL) 500 MG tablet Take 500 mg by mouth every 6 (six) hours as needed.  Marland Kitchen albuterol (ACCUNEB) 0.63 MG/3ML nebulizer solution USE ONE VIAL IN NEBULIZER EVERY 6 HOURS AS NEEDED  . albuterol (PROAIR HFA) 108 (90 Base) MCG/ACT inhaler Inhale 2 puffs into the lungs every 4 (four) hours as needed.    Marland Kitchen allopurinol (ZYLOPRIM) 100 MG tablet Take 2 tablets (200 mg total) by mouth daily.  Marland Kitchen aspirin EC 81 MG tablet Take 1 tablet (81 mg total) by mouth daily.  Marland Kitchen b complex vitamins tablet Take 1 tablet by mouth daily.  . benzonatate (TESSALON) 100 MG capsule Take 1 capsule (100 mg total) by mouth 3 (three) times daily as needed for cough.  . budesonide-formoterol (SYMBICORT) 160-4.5 MCG/ACT inhaler INHALE 2 PUFFS INTO THE LUNGS 2 TIMES DAILY  . Cyanocobalamin (VITAMIN B-12 PO) Take by mouth daily.  . famotidine (PEPCID) 20 MG tablet Take 1 tablet (20 mg total) by mouth at bedtime.  . Ferrous Fumarate-Folic Acid (HEMOCYTE-F) 324-1 MG TABS Take 1 tablet by mouth daily.  . fluticasone (FLONASE) 50 MCG/ACT nasal spray Place 1 spray into both nostrils daily.  . furosemide (LASIX) 40 MG tablet Take 1 tablet (40 mg total) by mouth daily.  Marland Kitchen glucose blood (ONE TOUCH TEST STRIPS) test strip Use as instructed to check blood sugar once weekly. DX 790.29   . loratadine (CLARITIN) 10 MG tablet Take 1 tablet (10 mg total) by mouth at bedtime.  . metoprolol succinate (TOPROL-XL) 50 MG 24 hr tablet Take 75 mg (1.5 tablets) daily.  . montelukast (SINGULAIR) 10 MG tablet Take 1 tablet (10 mg total) by mouth at bedtime as needed.  . Niacin (VITAMIN B-3 PO) Take  1 tablet by mouth daily.  . OXYGEN Inhale into the lungs.  . pantoprazole (PROTONIX) 40 MG tablet TAKE 1 TABLET TWICE DAILY  . potassium chloride SA (K-DUR,KLOR-CON) 20 MEQ tablet TAKE 1 TABLET EVERY DAY  . pravastatin (PRAVACHOL) 20 MG tablet TAKE 1 TABLET EVERY DAY  . predniSONE (DELTASONE) 20 MG tablet Take 1 tablet (20 mg total) by mouth daily with breakfast.  . Probiotic Product (PROBIOTIC PO) Take by mouth daily.  . ranitidine (ZANTAC) 300 MG tablet TAKE 1 TABLET AT BEDTIME  . sertraline (ZOLOFT) 100 MG tablet Take 1 tablet (100 mg total) by mouth daily.  Marland Kitchen SPIRIVA HANDIHALER 18 MCG inhalation capsule PLACE 1 CAPSULE (18 MCG TOTAL) INTO INHALER AND  INHALE CONTENTS ONE TIME DAILY.  . traMADol (ULTRAM) 50 MG tablet TAKE 1 TABLET THREE TIMES DAILY AS NEEDED  . [DISCONTINUED] doxycycline (VIBRAMYCIN) 100 MG capsule Take 1 capsule (100 mg total) by mouth 2 (two) times daily.   Allergies  Allergen Reactions  . Oseltamivir Phosphate     REACTION: nausea, vomiting, diarrhea, dizziness  . Sulfa Antibiotics Other (See Comments)    CP  . Ace Inhibitors   . Indomethacin   . Montelukast Sodium     REACTION: Heart palpitations, chest pain  . Sulfonamide Derivatives    Past Medical History:  Diagnosis Date  . Abnormal glucose tolerance test   . ACE-inhibitor cough   . Anemia 03/12/2014  . Arthritis   . Asthma    PFT 02/06/09 FEV1 1.41 (65%), FVC 1.92 (64), FEV1% 74, TLC 3.47 (71%), DLCO 48%, +BD  . Atypical chest pain    s/p cath, Normal coronaries, Non ST elevation myocardial infarction, Rt groin pseudoaneurysm  . Bacterial vaginosis 03/12/2014  . COPD (chronic obstructive pulmonary disease) ([place])   . Depression   . Diastolic heart failure ([place]) 04/07/2016  . DVT (deep venous thrombosis) ([place]) 1987  . Fall at home 12/04/2014  . GERD (gastroesophageal reflux disease)   . Gout   . Hypercalcemia 10/15/2014  . Hyperlipidemia   . Hypertension   . Hypoxia 10/15/2014  . Macular degeneration 04/10/2015  . Osteopenia 12/29/2006   Qualifier: Diagnosis of  By: Wynona Luna   . Pneumonia   . 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 Sun-damaged skin 10/24/2012  . Unspecified constipation 06/05/2013  . Urinary frequency 10/24/2012  . UTI (lower urinary tract infection) 10/24/2012  . Vitamin D deficiency 01/01/2015   Family History  Problem Relation Age of Onset  . Asthma Sister   . Arthritis Sister   . Hypertension Sister   . Hyperlipidemia Sister   . Cancer Sister 9    uterine  . Uterine cancer Sister   . Coronary artery disease Brother     x2  . Cancer Brother     lung, smoker  . Arthritis Brother   . Hypertension Sister   .  Arthritis Sister   . Hyperlipidemia Sister   . Emphysema Sister   . COPD Sister     smoker  . Heart disease Sister   . Hyperlipidemia Sister   . Hypertension Sister   . Arthritis Sister   . Emphysema Brother   . Heart disease Brother     MI smoker  . Mental illness Father   . Alcohol abuse Father   . Heart disease Mother     MI  . Hyperlipidemia Mother   . Epilepsy Daughter   . Hypertension Daughter   . Obesity Daughter   . COPD  Brother     smoker  . Cancer Brother     lung  . Coronary artery disease Other   . Colon polyps Sister    Past Surgical History:  Procedure Laterality Date  . APPENDECTOMY  1951  . TONSILLECTOMY  1950  . TUBAL LIGATION  1968   Social History   Social History  . Marital status: Widowed    Spouse name: N/A  . Number of children: 1  . Years of education: N/A   Occupational History  . Retired [employer]   Social History Main Topics  . Smoking status: Never Smoker  . Smokeless tobacco: Never Used  . Alcohol use No  . Drug use: No  . Sexual activity: No     Comment: lives alone, no dietary restrictions   Other Topics Concern  . Not on file   Social History Narrative  . No narrative on file     Review of Systems: General: negative for chills, fever, night sweats or weight changes.  Cardiovascular: negative for chest pain, dyspnea on exertion, edema, orthopnea, palpitations, paroxysmal nocturnal dyspnea or shortness of breath Dermatological: negative for rash Respiratory: negative for cough or wheezing Urologic: negative for hematuria Abdominal: negative for nausea, vomiting, diarrhea, bright red blood per rectum, melena, or hematemesis Neurologic: negative for visual changes, syncope, or dizziness All other systems reviewed and are otherwise negative except as noted above.   Physical Exam:  Blood pressure 116/62, pulse 75, height 5\' 5"  (1.651 m), weight 222 lb 9.6 oz (101 kg), SpO2 97 %.  General appearance: alert, cooperative  and no distress, obese  Neck: no carotid bruit and no JVD Lungs: no crackles, expiratory wheezing in bilateral upper lobes Heart: regular rate and rhythm, S1, S2 normal, no murmur, click, rub or gallop Extremities: trace bilateral LEE, with chronic [place] stasis dermatitis Pulses: 2+ and symmetric Skin: Skin color, texture, turgor normal. No rashes or lesions Neurologic: Grossly normal  EKG not performed-- personally reviewed   2D echo 05/09/16 Study Conclusions  - Left ventricle: The cavity size was normal. Wall thickness was   normal. Systolic function was normal. The estimated ejection   fraction was in the range of 50% to 55%. Wall motion was normal;   there were no regional wall motion abnormalities. Doppler   parameters are consistent with abnormal left ventricular   relaxation (grade 1 diastolic dysfunction). - Mitral valve: Calcified annulus. There was mild regurgitation. - Left atrium: The atrium was mildly dilated.  Impressions:  - Normal LV systolic function; grade 1 diastolic dysfunction; mild   LAE; mild MR.  ASSESSMENT AND PLAN:   1. Chronic Diastolic HF: mild trace bilateral LEE on exam with venous stasis dermatis on LEE. Overall improved. Weight down from 227>>222. No increased dyspnea from baseline. Continue 40 mg Lasix daily. We discussed daily weights. Pt advised to take an extra lasix tablet if > 3 lb weight gain in 24 hrs. We also discussed low sodium diet and foods to avoid. We will check a repeat BMP today to reassess renal function.   2. HTN: Losartan discontinued due to increase in SCr. Metoprolol increased to 75 mg. BP is well controlled with new regimen. HR is stable. F/u BMP today.   3. Renal Insufficiency: spite in SCr from 1.4>>2. Losartan was discontinued. Lasix added for diuresis. Repeat BMP today.    Follow-Up: keep appt with Dr. Radford Pax in July.   Montrel Donahoe Ladoris Gene, MHS [facility] 05/14/2016 3:48 PM

## 2016-05-15 LAB — BASIC METABOLIC PANEL
BUN/Creatinine Ratio: 15 (ref 12–28)
BUN: 23 mg/dL (ref 8–27)
CALCIUM: 9.5 mg/dL (ref 8.7–10.3)
CHLORIDE: 100 mmol/L (ref 96–106)
CO2: 29 mmol/L (ref 18–29)
Creatinine, Ser: 1.51 mg/dL — ABNORMAL HIGH (ref 0.57–1.00)
GFR calc Af Amer: 39 mL/min/{1.73_m2} — ABNORMAL LOW (ref >59)
GFR calc non Af Amer: 34 mL/min/{1.73_m2} — ABNORMAL LOW (ref >59)
GLUCOSE: 111 mg/dL — AB (ref 65–99)
POTASSIUM: 4.3 mmol/L (ref 3.5–5.2)
Sodium: 144 mmol/L (ref 134–144)

## 2016-05-19 ENCOUNTER — Ambulatory Visit (INDEPENDENT_AMBULATORY_CARE_PROVIDER_SITE_OTHER): Payer: Medicare HMO | Admitting: Family Medicine

## 2016-05-19 ENCOUNTER — Encounter: Payer: Self-pay | Admitting: Family Medicine

## 2016-05-19 VITALS — BP 135/77 | HR 74 | Temp 98.6°F | Wt 219.4 lb

## 2016-05-19 DIAGNOSIS — E782 Mixed hyperlipidemia: Secondary | ICD-10-CM | POA: Diagnosis not present

## 2016-05-19 DIAGNOSIS — I10 Essential (primary) hypertension: Secondary | ICD-10-CM | POA: Diagnosis not present

## 2016-05-19 DIAGNOSIS — E118 Type 2 diabetes mellitus with unspecified complications: Secondary | ICD-10-CM

## 2016-05-19 DIAGNOSIS — N289 Disorder of kidney and ureter, unspecified: Secondary | ICD-10-CM | POA: Diagnosis not present

## 2016-05-19 DIAGNOSIS — R600 Localized edema: Secondary | ICD-10-CM | POA: Diagnosis not present

## 2016-05-19 LAB — COMPREHENSIVE METABOLIC PANEL
ALK PHOS: 52 U/L (ref 39–117)
ALT: 10 U/L (ref 0–35)
AST: 14 U/L (ref 0–37)
Albumin: 4 g/dL (ref 3.5–5.2)
BUN: 23 mg/dL (ref 6–23)
CO2: 32 mEq/L (ref 19–32)
CREATININE: 1.61 mg/dL — AB (ref 0.40–1.20)
Calcium: 11 mg/dL — ABNORMAL HIGH (ref 8.4–10.5)
Chloride: 101 mEq/L (ref 96–112)
GFR: 33.15 mL/min — ABNORMAL LOW (ref >60.00)
GLUCOSE: 107 mg/dL — AB (ref 70–99)
POTASSIUM: 4.3 meq/L (ref 3.5–5.1)
SODIUM: 141 meq/L (ref 135–145)
TOTAL PROTEIN: 7.1 g/dL (ref 6.0–8.3)
Total Bilirubin: 0.5 mg/dL (ref 0.2–1.2)

## 2016-05-19 MED ORDER — METOLAZONE 2.5 MG PO TABS: ORAL_TABLET | ORAL | 1 refills | Status: DC

## 2016-05-19 NOTE — Patient Instructions (Signed)
Edema Edema is when you have too much fluid in your body or under your skin. Edema may make your legs, feet, and ankles swell up. Swelling is also common in looser tissues, like around your eyes. This is a common condition. It gets more common as you get older. There are many possible causes of edema. Eating too much salt (sodium) and being on your feet or sitting for a long time can cause edema in your legs, feet, and ankles. Hot weather may make edema worse. Edema is usually painless. Your skin may look swollen or shiny. Follow these instructions at home:  Keep the swollen body part raised (elevated) above the level of your heart when you are sitting or lying down.  Do not sit still or stand for a long time.  Do not wear tight clothes. Do not wear garters on your upper legs.  Exercise your legs. This can help the swelling go down.  Wear elastic bandages or support stockings as told by your doctor.  Eat a low-salt (low-sodium) diet to reduce fluid as told by your doctor.  Depending on the cause of your swelling, you may need to limit how much fluid you drink (fluid restriction).  Take over-the-counter and prescription medicines only as told by your doctor. Contact a doctor if:  Treatment is not working.  You have heart, liver, or kidney disease and have symptoms of edema.  You have sudden and unexplained weight gain. Get help right away if:  You have shortness of breath or chest pain.  You cannot breathe when you lie down.  You have pain, redness, or warmth in the swollen areas.  You have heart, liver, or kidney disease and get edema all of a sudden.  You have a fever and your symptoms get worse all of a sudden. Summary  Edema is when you have too much fluid in your body or under your skin.  Edema may make your legs, feet, and ankles swell up. Swelling is also common in looser tissues, like around your eyes.  Raise (elevate) the swollen body part above the level of your  heart when you are sitting or lying down.  Follow your doctor's instructions about diet and how much fluid you can drink (fluid restriction). This information is not intended to replace advice given to you by your health care provider. Make sure you discuss any questions you have with your health care provider. Document Released: 06/25/2007 Document Revised: 01/25/2016 Document Reviewed: 01/25/2016 Elsevier Interactive Patient Education  2017 [employer]  

## 2016-05-19 NOTE — Progress Notes (Signed)
Patient ID: [name], female   DOB: [birth year], 76 y.o.   MRN: [id number]   Subjective:  I acted as a Education administrator for Penni Homans, [place], [place]   Patient ID: [name], female    DOB: [birth year], 76 y.o.   MRN: [id number]  Chief Complaint  Patient presents with  . Urinary Urgency    6-week F/U.     HPI  Patient is in today for a 6-week follow up for urinary urgency. Patient states that she is feeling better. Also states that she saw her Cardiologist last week on 05/14/2016 and was told to stop her Losartan and was started on Toprol 50 mg. Patient has a Hx of HTN, asthma, hypoxia, GERD, Type II Diabetes. Patient has no acute concerns noted at this time. She continues to struggle with significant pedal edema in feet and legs with aching pain in feet and lower legs as a result. She denies it keeps her up at night. Denies CP/palp/SOB/HA/congestion/fevers/GI or GU c/o. Taking meds as prescribed  Patient Care Team: Mosie Lukes, MD as PCP - General (Family Medicine) Elsie Stain, MD as Consulting Physician (Pulmonary Disease) Calton Dach, MD as Referring Physician (Optometry) [place], NP as Nurse Practitioner (Pulmonary Disease) Bo Merino, MD as Consulting Physician (Rheumatology)   Past Medical History:  Diagnosis Date  . Abnormal glucose tolerance test   . ACE-inhibitor cough   . Anemia 03/12/2014  . Arthritis   . Asthma    PFT 02/06/09 FEV1 1.41 (65%), FVC 1.92 (64), FEV1% 74, TLC 3.47 (71%), DLCO 48%, +BD  . Atypical chest pain    s/p cath, Normal coronaries, Non ST elevation myocardial infarction, Rt groin pseudoaneurysm  . Bacterial vaginosis 03/12/2014  . COPD (chronic obstructive pulmonary disease) ([place])   . Depression   . Diastolic heart failure ([place]) 04/07/2016  . DVT (deep venous thrombosis) ([place]) 1987  . Fall at home 12/04/2014  . GERD (gastroesophageal reflux disease)   . Gout   . Hypercalcemia 10/15/2014  . Hyperlipidemia   .  Hypertension   . Hypoxia 10/15/2014  . Macular degeneration 04/10/2015  . Osteopenia 12/29/2006   Qualifier: Diagnosis of  By: Wynona Luna   . Pneumonia   . 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 Sun-damaged skin 10/24/2012  . Unspecified constipation 06/05/2013  . Urinary frequency 10/24/2012  . UTI (lower urinary tract infection) 10/24/2012  . Vitamin D deficiency 01/01/2015    Past Surgical History:  Procedure Laterality Date  . APPENDECTOMY  1951  . TONSILLECTOMY  1950  . TUBAL LIGATION  1968    Family History  Problem Relation Age of Onset  . Asthma Sister   . Arthritis Sister   . Hypertension Sister   . Hyperlipidemia Sister   . Cancer Sister 43    uterine  . Uterine cancer Sister   . Coronary artery disease Brother     x2  . Cancer Brother     lung, smoker  . Arthritis Brother   . Hypertension Sister   . Arthritis Sister   . Hyperlipidemia Sister   . Emphysema Sister   . COPD Sister     smoker  . Heart disease Sister   . Hyperlipidemia Sister   . Hypertension Sister   . Arthritis Sister   . Emphysema Brother   . Heart disease Brother     MI smoker  . Mental illness Father   . Alcohol abuse Father   . Heart  disease Mother     MI  . Hyperlipidemia Mother   . Epilepsy Daughter   . Hypertension Daughter   . Obesity Daughter   . COPD Brother     smoker  . Cancer Brother     lung  . Coronary artery disease Other   . Colon polyps Sister     Social History   Social History  . Marital status: Widowed    Spouse name: N/A  . Number of children: 1  . Years of education: N/A   Occupational History  . Retired [employer]   Social History Main Topics  . Smoking status: Never Smoker  . Smokeless tobacco: Never Used  . Alcohol use No  . Drug use: No  . Sexual activity: No     Comment: lives alone, no dietary restrictions   Other Topics Concern  . Not on file   Social History Narrative  . No narrative on file    Outpatient Medications  Prior to Visit  Medication Sig Dispense Refill  . acetaminophen (TYLENOL) 500 MG tablet Take 500 mg by mouth every 6 (six) hours as needed.    Marland Kitchen albuterol (ACCUNEB) 0.63 MG/3ML nebulizer solution USE ONE VIAL IN NEBULIZER EVERY 6 HOURS AS NEEDED 150 mL 1  . albuterol (PROAIR HFA) 108 (90 Base) MCG/ACT inhaler Inhale 2 puffs into the lungs every 4 (four) hours as needed. 1 Inhaler 1  . allopurinol (ZYLOPRIM) 100 MG tablet Take 2 tablets (200 mg total) by mouth daily. 180 tablet 1  . aspirin EC 81 MG tablet Take 1 tablet (81 mg total) by mouth daily.    Marland Kitchen b complex vitamins tablet Take 1 tablet by mouth daily.    . benzonatate (TESSALON) 100 MG capsule Take 1 capsule (100 mg total) by mouth 3 (three) times daily as needed for cough. 21 capsule 0  . budesonide-formoterol (SYMBICORT) 160-4.5 MCG/ACT inhaler INHALE 2 PUFFS INTO THE LUNGS 2 TIMES DAILY 3 Inhaler 1  . Cyanocobalamin (VITAMIN B-12 PO) Take by mouth daily.    . famotidine (PEPCID) 20 MG tablet Take 1 tablet (20 mg total) by mouth at bedtime. 30 tablet 0  . Ferrous Fumarate-Folic Acid (HEMOCYTE-F) 324-1 MG TABS Take 1 tablet by mouth daily. 30 each 2  . fluticasone (FLONASE) 50 MCG/ACT nasal spray Place 1 spray into both nostrils daily. 16 g 3  . furosemide (LASIX) 40 MG tablet Take 1 tablet (40 mg total) by mouth daily. 90 tablet 3  . glucose blood (ONE TOUCH TEST STRIPS) test strip Use as instructed to check blood sugar once weekly. DX 790.29     . loratadine (CLARITIN) 10 MG tablet Take 1 tablet (10 mg total) by mouth at bedtime. 30 tablet 5  . metoprolol succinate (TOPROL-XL) 50 MG 24 hr tablet Take 75 mg (1.5 tablets) daily. 135 tablet 3  . montelukast (SINGULAIR) 10 MG tablet Take 1 tablet (10 mg total) by mouth at bedtime as needed. 30 tablet 3  . Niacin (VITAMIN B-3 PO) Take 1 tablet by mouth daily.    . OXYGEN Inhale into the lungs.    . pantoprazole (PROTONIX) 40 MG tablet TAKE 1 TABLET TWICE DAILY 180 tablet 1  . potassium  chloride SA (K-DUR,KLOR-CON) 20 MEQ tablet TAKE 1 TABLET EVERY DAY 90 tablet 2  . pravastatin (PRAVACHOL) 20 MG tablet TAKE 1 TABLET EVERY DAY 90 tablet 1  . predniSONE (DELTASONE) 20 MG tablet Take 1 tablet (20 mg total) by mouth daily with breakfast. 5  tablet 0  . Probiotic Product (PROBIOTIC PO) Take by mouth daily.    . ranitidine (ZANTAC) 300 MG tablet TAKE 1 TABLET AT BEDTIME 90 tablet 3  . sertraline (ZOLOFT) 100 MG tablet Take 1 tablet (100 mg total) by mouth daily. 30 tablet 3  . SPIRIVA HANDIHALER 18 MCG inhalation capsule PLACE 1 CAPSULE (18 MCG TOTAL) INTO INHALER AND INHALE CONTENTS ONE TIME DAILY. 90 capsule 1  . traMADol (ULTRAM) 50 MG tablet TAKE 1 TABLET THREE TIMES DAILY AS NEEDED 90 tablet 0   No facility-administered medications prior to visit.     Allergies  Allergen Reactions  . Oseltamivir Phosphate     REACTION: nausea, vomiting, diarrhea, dizziness  . Sulfa Antibiotics Other (See Comments)    CP  . Ace Inhibitors   . Indomethacin   . Montelukast Sodium     REACTION: Heart palpitations, chest pain  . Sulfonamide Derivatives     Review of Systems  Constitutional: Positive for malaise/fatigue. Negative for fever.  HENT: Negative for congestion.   Eyes: Negative for blurred vision.  Respiratory: Negative for cough and shortness of breath.   Cardiovascular: Positive for leg swelling. Negative for chest pain and palpitations.  Gastrointestinal: Negative for vomiting.  Musculoskeletal: Positive for joint pain. Negative for back pain.  Skin: Negative for rash.  Neurological: Negative for loss of consciousness and headaches.       Objective:    Physical Exam  Constitutional: She is oriented to person, place, and time. She appears well-developed and well-nourished. No distress.  HENT:  Head: Normocephalic and atraumatic.  Eyes: Conjunctivae are normal.  Neck: Normal range of motion. No thyromegaly present.  Cardiovascular: Normal rate and regular rhythm.     Murmur heard. Pulmonary/Chest: Effort normal and breath sounds normal. She has no wheezes.  Abdominal: Soft. Bowel sounds are normal. There is no tenderness.  Musculoskeletal: She exhibits edema. She exhibits no deformity.  2 + edema b/l LE with chronic venous stasis skin changes.   Neurological: She is alert and oriented to person, place, and time.  Skin: Skin is warm and dry. She is not diaphoretic. There is erythema.  Psychiatric: She has a normal mood and affect.    BP 135/77 (BP Location: Left Wrist, Patient Position: Sitting, Cuff Size: Normal)   Pulse 74   Temp 98.6 F (37 C) (Oral)   Wt 219 lb 6.4 oz (99.5 kg)   SpO2 99% Comment: O2  BMI 36.51 kg/m  Wt Readings from Last 3 Encounters:  05/19/16 219 lb 6.4 oz (99.5 kg)  05/14/16 222 lb 9.6 oz (101 kg)  04/25/16 227 lb 8 oz (103.2 kg)   BP Readings from Last 3 Encounters:  05/19/16 135/77  05/14/16 116/62  04/25/16 130/70     Immunization History  Administered Date(s) Administered  . Influenza Split 12/08/2011  . Influenza Whole 10/20/2006, 11/09/2007, 11/14/2008, 10/30/2009  . Influenza, High Dose Seasonal PF 09/25/2015  . Influenza,inj,Quad PF,36+ Mos 10/20/2012, 11/28/2013, 10/02/2014  . Pneumococcal Conjugate-13 02/21/2013  . Pneumococcal Polysaccharide-23 06/30/2006  . Td 06/01/2007    Health Maintenance  Topic Date Due  . URINE MICROALBUMIN  03/01/2015  . FOOT EXAM  04/09/2016  . OPHTHALMOLOGY EXAM  10/07/2016 (Originally 10/08/2015)  . INFLUENZA VACCINE  08/20/2016  . HEMOGLOBIN A1C  10/08/2016  . TETANUS/TDAP  05/31/2017  . COLONOSCOPY  11/10/2018  . DEXA SCAN  Completed  . PNA vac Low Risk Adult  Completed    Lab Results  Component Value Date  WBC 9.4 04/07/2016   HGB 10.7 (L) 04/07/2016   HCT 33.3 (L) 04/07/2016   PLT 159.0 04/07/2016   GLUCOSE 107 (H) 05/19/2016   CHOL 128 04/07/2016   TRIG 130.0 04/07/2016   HDL 51.80 04/07/2016   LDLDIRECT 142.7 12/16/2005   LDLCALC 50 04/07/2016    ALT 10 05/19/2016   AST 14 05/19/2016   NA 141 05/19/2016   K 4.3 05/19/2016   CL 101 05/19/2016   CREATININE 1.61 (H) 05/19/2016   BUN 23 05/19/2016   CO2 32 05/19/2016   TSH 4.77 (H) 04/07/2016   INR 1.0 09/12/2006   HGBA1C 5.9 04/07/2016   MICROALBUR 0.8 02/28/2014    Lab Results  Component Value Date   TSH 4.77 (H) 04/07/2016   Lab Results  Component Value Date   WBC 9.4 04/07/2016   HGB 10.7 (L) 04/07/2016   HCT 33.3 (L) 04/07/2016   MCV 94.4 04/07/2016   PLT 159.0 04/07/2016   Lab Results  Component Value Date   NA 141 05/19/2016   K 4.3 05/19/2016   CO2 32 05/19/2016   GLUCOSE 107 (H) 05/19/2016   BUN 23 05/19/2016   CREATININE 1.61 (H) 05/19/2016   BILITOT 0.5 05/19/2016   ALKPHOS 52 05/19/2016   AST 14 05/19/2016   ALT 10 05/19/2016   PROT 7.1 05/19/2016   ALBUMIN 4.0 05/19/2016   CALCIUM 11.0 (H) 05/19/2016   ANIONGAP 7 02/14/2016   GFR 33.15 (L) 05/19/2016   Lab Results  Component Value Date   CHOL 128 04/07/2016   Lab Results  Component Value Date   HDL 51.80 04/07/2016   Lab Results  Component Value Date   LDLCALC 50 04/07/2016   Lab Results  Component Value Date   TRIG 130.0 04/07/2016   Lab Results  Component Value Date   CHOLHDL 2 04/07/2016   Lab Results  Component Value Date   HGBA1C 5.9 04/07/2016         Assessment & Plan:   Problem List Items Addressed This Visit    Essential hypertension    Well controlled, no changes to meds. Encouraged heart healthy diet such as the DASH diet and exercise as tolerated.       Relevant Medications   metolazone (ZAROXOLYN) 2.5 MG tablet   Diabetes type 2, controlled (HCC)    hgba1c acceptable, minimize simple carbs. Increase exercise as tolerated.       Hyperlipidemia, mixed    Encouraged heart healthy diet, increase exercise, avoid trans fats, consider a krill oil cap daily      Relevant Medications   metolazone (ZAROXOLYN) 2.5 MG tablet   Pedal edema    Struggling with  increased edema and b/l leg and foot pain as a result. Encouraged to elevate feet above heart 15 minutes bid, minimize sodium, try compression hose and given a dose of Zaroxolyn to try 1/2 prior to am Lasix dose to see if that helps her edema.        Other Visit Diagnoses    Renal insufficiency    -  Primary   Relevant Orders   Comp Met (CMET) (Completed)   Comp Met (CMET)      I am having Ms. Hovland start on metolazone. I am also having her maintain her glucose blood, acetaminophen, Niacin (VITAMIN B-3 PO), Cyanocobalamin (VITAMIN B-12 PO), OXYGEN, b complex vitamins, Probiotic Product (PROBIOTIC PO), budesonide-formoterol, ranitidine, aspirin EC, albuterol, montelukast, pravastatin, potassium chloride SA, benzonatate, sertraline, Ferrous Fumarate-Folic Acid, allopurinol, SPIRIVA HANDIHALER, loratadine, fluticasone,  famotidine, pantoprazole, traMADol, predniSONE, albuterol, furosemide, and metoprolol succinate.  Meds ordered this encounter  Medications  . metolazone (ZAROXOLYN) 2.5 MG tablet    Sig: 1 tab po daily 1/2 hour prior to Lasix prn pedal edema, increasing SOB, weight gain >3#    Dispense:  20 tablet    Refill:  1    CMA served as scribe during this visit. History, Physical and Plan performed by medical provider. Documentation and orders reviewed and attested to.  Penni Homans, MD

## 2016-05-19 NOTE — Progress Notes (Signed)
Pre visit review using our clinic review tool, if applicable. No additional management support is needed unless otherwise documented below in the visit note. 

## 2016-05-20 ENCOUNTER — Other Ambulatory Visit (INDEPENDENT_AMBULATORY_CARE_PROVIDER_SITE_OTHER): Payer: Medicare HMO

## 2016-05-20 ENCOUNTER — Other Ambulatory Visit: Payer: Self-pay | Admitting: Family Medicine

## 2016-05-20 DIAGNOSIS — J4520 Mild intermittent asthma, uncomplicated: Secondary | ICD-10-CM | POA: Diagnosis not present

## 2016-05-20 DIAGNOSIS — R0902 Hypoxemia: Secondary | ICD-10-CM | POA: Diagnosis not present

## 2016-05-20 LAB — CALCIUM, IONIZED: CALCIUM ION: 5.4 mg/dL (ref 4.8–5.6)

## 2016-05-20 LAB — VITAMIN D 25 HYDROXY (VIT D DEFICIENCY, FRACTURES): VITD: 42.08 ng/mL (ref 30.00–100.00)

## 2016-05-20 NOTE — Assessment & Plan Note (Signed)
Struggling with increased edema and b/l leg and foot pain as a result. Encouraged to elevate feet above heart 15 minutes bid, minimize sodium, try compression hose and given a dose of Zaroxolyn to try 1/2 prior to am Lasix dose to see if that helps her edema.

## 2016-05-20 NOTE — Assessment & Plan Note (Signed)
Well controlled, no changes to meds. Encouraged heart healthy diet such as the DASH diet and exercise as tolerated.  °

## 2016-05-20 NOTE — Assessment & Plan Note (Signed)
hgba1c acceptable, minimize simple carbs. Increase exercise as tolerated.  

## 2016-05-20 NOTE — Assessment & Plan Note (Signed)
Encouraged heart healthy diet, increase exercise, avoid trans fats, consider a krill oil cap daily 

## 2016-05-21 LAB — PTH, INTACT AND CALCIUM
CALCIUM: 9.8 mg/dL (ref 8.6–10.4)
PTH: 19 pg/mL (ref 14–64)

## 2016-05-22 ENCOUNTER — Other Ambulatory Visit: Payer: Self-pay | Admitting: Family Medicine

## 2016-05-22 MED ORDER — TRAMADOL HCL 50 MG PO TABS: 50.0000 mg | ORAL_TABLET | Freq: Three times a day (TID) | ORAL | 0 refills | Status: DC | PRN

## 2016-05-22 NOTE — Telephone Encounter (Signed)
Faxed hardcopy for tramadol to [facility]

## 2016-05-22 NOTE — Telephone Encounter (Signed)
Requesting:   Tramadol Contract   03/21/16 UDS   Low riskn next is due on 09/21/16 Last OV    05/19/16---future appt is on 06/19/16 Last Refill     #90 no refills on 03/18/2016  Please Advise

## 2016-05-22 NOTE — Addendum Note (Signed)
Addended by: Sharon Seller B on: 05/22/2016 01:26 PM   Modules accepted: Orders

## 2016-05-30 ENCOUNTER — Other Ambulatory Visit: Payer: Self-pay | Admitting: Family Medicine

## 2016-05-30 ENCOUNTER — Telehealth: Payer: Self-pay | Admitting: Family Medicine

## 2016-05-30 NOTE — Telephone Encounter (Signed)
Caller name: Relationship to patient: Self Can be reached: [phone]  Pharmacy:  Reason for call: Patient request referral to see a Dermatologist

## 2016-06-01 ENCOUNTER — Other Ambulatory Visit: Payer: Self-pay | Admitting: Family Medicine

## 2016-06-01 DIAGNOSIS — L578 Other skin changes due to chronic exposure to nonionizing radiation: Secondary | ICD-10-CM

## 2016-06-02 ENCOUNTER — Other Ambulatory Visit (INDEPENDENT_AMBULATORY_CARE_PROVIDER_SITE_OTHER): Payer: Medicare HMO

## 2016-06-02 DIAGNOSIS — N289 Disorder of kidney and ureter, unspecified: Secondary | ICD-10-CM

## 2016-06-03 ENCOUNTER — Ambulatory Visit (INDEPENDENT_AMBULATORY_CARE_PROVIDER_SITE_OTHER): Payer: Medicare HMO | Admitting: Pulmonary Disease

## 2016-06-03 ENCOUNTER — Encounter: Payer: Self-pay | Admitting: Pulmonary Disease

## 2016-06-03 DIAGNOSIS — I5032 Chronic diastolic (congestive) heart failure: Secondary | ICD-10-CM

## 2016-06-03 DIAGNOSIS — J4540 Moderate persistent asthma, uncomplicated: Secondary | ICD-10-CM | POA: Diagnosis not present

## 2016-06-03 DIAGNOSIS — J9611 Chronic respiratory failure with hypoxia: Secondary | ICD-10-CM

## 2016-06-03 LAB — COMPREHENSIVE METABOLIC PANEL
ALBUMIN: 4.1 g/dL (ref 3.5–5.2)
ALT: 10 U/L (ref 0–35)
AST: 16 U/L (ref 0–37)
Alkaline Phosphatase: 47 U/L (ref 39–117)
BUN: 34 mg/dL — AB (ref 6–23)
CO2: 34 mEq/L — ABNORMAL HIGH (ref 19–32)
Calcium: 10.8 mg/dL — ABNORMAL HIGH (ref 8.4–10.5)
Chloride: 99 mEq/L (ref 96–112)
Creatinine, Ser: 1.69 mg/dL — ABNORMAL HIGH (ref 0.40–1.20)
GFR: 31.34 mL/min — AB (ref >60.00)
Glucose, Bld: 130 mg/dL — ABNORMAL HIGH (ref 70–99)
POTASSIUM: 4.7 meq/L (ref 3.5–5.1)
SODIUM: 140 meq/L (ref 135–145)
Total Bilirubin: 0.5 mg/dL (ref 0.2–1.2)
Total Protein: 7.3 g/dL (ref 6.0–8.3)

## 2016-06-03 NOTE — Assessment & Plan Note (Signed)
Continue  Lasix and metolazone

## 2016-06-03 NOTE — Patient Instructions (Signed)
Check ambulatory oxygen saturation- okay to stay off oxygen at rest  Okay to stop taking Spiriva. Stay on Symbicort  Diuretics as advised by cardiology

## 2016-06-03 NOTE — Progress Notes (Signed)
   Subjective:    Patient ID: [name], female    DOB: 04-06-1940, 76 y.o.   MRN: [id number]  HPI  76 yo  never smoker with chronic asthma and bronchitis , O2 dependent with act and At bedtime   Former Dr. Joya Gaskins  Patient   She reports asthma of adult-onset in her 86s, last exacerbation was 5 years ago, maintained on Symbicort and Spiriva She has been maintained on oxygen 2 L with a portable concentrator for at least the last 2 years. She struggled with bronchitis through the winter and required prednisone course. Chest x-ray 03/2016 shows elevated left hemidiaphragm. All her prior PFTs were reviewed and they show moderate restriction. She struggled with chronic bipedal edema in spite of being on Lasix and her weight is being monitored by cardiology Labs show CK D with last creatinine documented at 1.6  Her oxygen saturation was 94% at rest, desaturated to 85% after 1 (the office and improved with oxygen again to 96%  Significant tests/ events reviewed  PFTs 2011-FEV1 65%, ratio 74, DLCO 48%, +BD , FVC 64%.   PFTs 04/2013 ratio 77, FEV1 66%, FVC 67%, DLCO 64% >> moderate restriction  Echo 04/2016-normal LVEF, grade 1 diastolic dysfunction  CT angiogram 12/2013 no evidence of fibrosis Chest x-ray 03/2016 elevated left hemidiaphragm    Past Medical History:  Diagnosis Date  . Abnormal glucose tolerance test   . ACE-inhibitor cough   . Anemia 03/12/2014  . Arthritis   . Asthma    PFT 02/06/09 FEV1 1.41 (65%), FVC 1.92 (64), FEV1% 74, TLC 3.47 (71%), DLCO 48%, +BD  . Atypical chest pain    s/p cath, Normal coronaries, Non ST elevation myocardial infarction, Rt groin pseudoaneurysm  . Bacterial vaginosis 03/12/2014  . COPD (chronic obstructive pulmonary disease) ([place])   . Depression   . Diastolic heart failure ([place]) 04/07/2016  . DVT (deep venous thrombosis) ([place]) 1987  . Fall at home 12/04/2014  . GERD (gastroesophageal reflux disease)   . Gout   . Hypercalcemia 10/15/2014    . Hyperlipidemia   . Hypertension   . Hypoxia 10/15/2014  . Macular degeneration 04/10/2015  . Osteopenia 12/29/2006   Qualifier: Diagnosis of  By: Wynona Luna   . Pneumonia   . 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 Sun-damaged skin 10/24/2012  . Unspecified constipation 06/05/2013  . Urinary frequency 10/24/2012  . UTI (lower urinary tract infection) 10/24/2012  . Vitamin D deficiency 01/01/2015     Review of Systems neg for any significant sore throat, dysphagia, itching, sneezing, nasal congestion or excess/ purulent secretions, fever, chills, sweats, unintended wt loss, pleuritic or exertional cp, hempoptysis, orthopnea pnd or change in chronic leg swelling. Also denies presyncope, palpitations, heartburn, abdominal pain, nausea, vomiting, diarrhea or change in bowel or urinary habits, dysuria,hematuria, rash, arthralgias, visual complaints, headache, numbness weakness or ataxia.     Objective:   Physical Exam   Gen. Pleasant, obese, in no distress ENT - no lesions, no post nasal drip Neck: No JVD, no thyromegaly, no carotid bruits Lungs: no use of accessory muscles, no dullness to percussion, decreased without rales or rhonchi  Cardiovascular: Rhythm regular, heart sounds  normal, no murmurs or gallops, no peripheral edema Musculoskeletal: No deformities, no cyanosis or clubbing , no tremors        Assessment & Plan:

## 2016-06-03 NOTE — Assessment & Plan Note (Addendum)
Check ambulatory oxygen saturation- okay to stay off oxygen at rest, but does needed during exertion and sleep

## 2016-06-03 NOTE — Assessment & Plan Note (Signed)
Okay to stop taking Spiriva. Stay on Symbicort

## 2016-06-09 DIAGNOSIS — Z9842 Cataract extraction status, left eye: Secondary | ICD-10-CM | POA: Diagnosis not present

## 2016-06-09 DIAGNOSIS — Z961 Presence of intraocular lens: Secondary | ICD-10-CM | POA: Diagnosis not present

## 2016-06-09 DIAGNOSIS — H26491 Other secondary cataract, right eye: Secondary | ICD-10-CM | POA: Diagnosis not present

## 2016-06-09 DIAGNOSIS — H11153 Pinguecula, bilateral: Secondary | ICD-10-CM | POA: Diagnosis not present

## 2016-06-09 DIAGNOSIS — H04123 Dry eye syndrome of bilateral lacrimal glands: Secondary | ICD-10-CM | POA: Diagnosis not present

## 2016-06-09 DIAGNOSIS — H11423 Conjunctival edema, bilateral: Secondary | ICD-10-CM | POA: Diagnosis not present

## 2016-06-09 DIAGNOSIS — H40013 Open angle with borderline findings, low risk, bilateral: Secondary | ICD-10-CM | POA: Diagnosis not present

## 2016-06-09 DIAGNOSIS — H52223 Regular astigmatism, bilateral: Secondary | ICD-10-CM | POA: Diagnosis not present

## 2016-06-09 DIAGNOSIS — H18413 Arcus senilis, bilateral: Secondary | ICD-10-CM | POA: Diagnosis not present

## 2016-06-19 ENCOUNTER — Ambulatory Visit (INDEPENDENT_AMBULATORY_CARE_PROVIDER_SITE_OTHER): Payer: Medicare HMO | Admitting: Family Medicine

## 2016-06-19 ENCOUNTER — Encounter: Payer: Self-pay | Admitting: Family Medicine

## 2016-06-19 VITALS — BP 100/68 | HR 88 | Temp 98.1°F | Resp 18 | Wt 211.0 lb

## 2016-06-19 DIAGNOSIS — L0390 Cellulitis, unspecified: Secondary | ICD-10-CM | POA: Diagnosis not present

## 2016-06-19 DIAGNOSIS — E782 Mixed hyperlipidemia: Secondary | ICD-10-CM

## 2016-06-19 DIAGNOSIS — E118 Type 2 diabetes mellitus with unspecified complications: Secondary | ICD-10-CM | POA: Diagnosis not present

## 2016-06-19 DIAGNOSIS — I10 Essential (primary) hypertension: Secondary | ICD-10-CM

## 2016-06-19 MED ORDER — METOPROLOL SUCCINATE ER 50 MG PO TB24: ORAL_TABLET | ORAL | 3 refills | Status: DC

## 2016-06-19 MED ORDER — CEPHALEXIN 500 MG PO CAPS: 500.0000 mg | ORAL_CAPSULE | Freq: Four times a day (QID) | ORAL | 0 refills | Status: DC

## 2016-06-19 NOTE — Patient Instructions (Signed)

## 2016-06-19 NOTE — Progress Notes (Signed)
Subjective:  I acted as a Education administrator for Dr. Charlett Blake. [place], [place]  Patient ID: [name], female    DOB: 1940-04-08, 76 y.o.   MRN: [id number]  Chief Complaint  Patient presents with  . Follow-up    HPI  Patient is in today for a 4 week follow up. No recent febrile illness or acute hospitalizations. Denies CP/palp/SOB/HA/congestion/fevers/GI or GU c/o. Taking meds as prescribed. She is noting some pain and swelling in both lower extremities. She denies any fall or rinjuries but they are red, hot swollen. No polyuria or polydipsia. Continues to try and minimize carbohydrate intake.    Patient Care Team: Mosie Lukes, MD as PCP - General (Family Medicine) Elsie Stain, MD as Consulting Physician (Pulmonary Disease) Shirley Muscat Loreen Freud, MD as Referring Physician (Optometry) Parrett, Fonnie Mu, NP as Nurse Practitioner (Pulmonary Disease) Bo Merino, MD as Consulting Physician (Rheumatology)   Past Medical History:  Diagnosis Date  . Abnormal glucose tolerance test   . ACE-inhibitor cough   . Anemia 03/12/2014  . Arthritis   . Asthma    PFT 02/06/09 FEV1 1.41 (65%), FVC 1.92 (64), FEV1% 74, TLC 3.47 (71%), DLCO 48%, +BD  . Atypical chest pain    s/p cath, Normal coronaries, Non ST elevation myocardial infarction, Rt groin pseudoaneurysm  . Bacterial vaginosis 03/12/2014  . Cellulitis 06/22/2016  . COPD (chronic obstructive pulmonary disease) ([place])   . Depression   . Diastolic heart failure ([place]) 04/07/2016  . DVT (deep venous thrombosis) ([place]) 1987  . Fall at home 12/04/2014  . GERD (gastroesophageal reflux disease)   . Gout   . Hypercalcemia 10/15/2014  . Hyperlipidemia   . Hypertension   . Hypoxia 10/15/2014  . Macular degeneration 04/10/2015  . Osteopenia 12/29/2006   Qualifier: Diagnosis of  By: Wynona Luna   . Pneumonia   . 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 Sun-damaged skin 10/24/2012  . Unspecified constipation 06/05/2013  . Urinary frequency  10/24/2012  . UTI (lower urinary tract infection) 10/24/2012  . Vitamin D deficiency 01/01/2015    Past Surgical History:  Procedure Laterality Date  . APPENDECTOMY  1951  . TONSILLECTOMY  1950  . TUBAL LIGATION  1968    Family History  Problem Relation Age of Onset  . Asthma Sister   . Arthritis Sister   . Hypertension Sister   . Hyperlipidemia Sister   . Cancer Sister 9       uterine  . Uterine cancer Sister   . Coronary artery disease Brother        x2  . Cancer Brother        lung, smoker  . Arthritis Brother   . Hypertension Sister   . Arthritis Sister   . Hyperlipidemia Sister   . Emphysema Sister   . COPD Sister        smoker  . Heart disease Sister   . Hyperlipidemia Sister   . Hypertension Sister   . Arthritis Sister   . Emphysema Brother   . Heart disease Brother        MI smoker  . Mental illness Father   . Alcohol abuse Father   . Heart disease Mother        MI  . Hyperlipidemia Mother   . Epilepsy Daughter   . Hypertension Daughter   . Obesity Daughter   . COPD Brother        smoker  . Cancer Brother  lung  . Coronary artery disease Other   . Colon polyps Sister     Social History   Social History  . Marital status: Widowed    Spouse name: N/A  . Number of children: 1  . Years of education: N/A   Occupational History  . Retired [employer]   Social History Main Topics  . Smoking status: Never Smoker  . Smokeless tobacco: Never Used  . Alcohol use No  . Drug use: No  . Sexual activity: No     Comment: lives alone, no dietary restrictions   Other Topics Concern  . Not on file   Social History Narrative  . No narrative on file    Outpatient Medications Prior to Visit  Medication Sig Dispense Refill  . acetaminophen (TYLENOL) 500 MG tablet Take 500 mg by mouth every 6 (six) hours as needed.    Marland Kitchen albuterol (ACCUNEB) 0.63 MG/3ML nebulizer solution USE ONE VIAL IN NEBULIZER EVERY 6 HOURS AS NEEDED 150 mL 1  . albuterol  (PROAIR HFA) 108 (90 Base) MCG/ACT inhaler Inhale 2 puffs into the lungs every 4 (four) hours as needed. 1 Inhaler 1  . allopurinol (ZYLOPRIM) 100 MG tablet Take 2 tablets (200 mg total) by mouth daily. 180 tablet 1  . aspirin EC 81 MG tablet Take 1 tablet (81 mg total) by mouth daily.    Marland Kitchen b complex vitamins tablet Take 1 tablet by mouth daily.    . budesonide-formoterol (SYMBICORT) 160-4.5 MCG/ACT inhaler INHALE 2 PUFFS INTO THE LUNGS 2 TIMES DAILY 3 Inhaler 1  . Cyanocobalamin (VITAMIN B-12 PO) Take by mouth daily.    . famotidine (PEPCID) 20 MG tablet Take 1 tablet (20 mg total) by mouth at bedtime. 30 tablet 0  . Ferrous Fumarate-Folic Acid (HEMOCYTE-F) 324-1 MG TABS Take 1 tablet by mouth daily. 30 each 2  . fluticasone (FLONASE) 50 MCG/ACT nasal spray Place 1 spray into both nostrils daily. 16 g 3  . furosemide (LASIX) 40 MG tablet Take 1 tablet (40 mg total) by mouth daily. 90 tablet 3  . glucose blood (ONE TOUCH TEST STRIPS) test strip Use as instructed to check blood sugar once weekly. DX 790.29     . loratadine (CLARITIN) 10 MG tablet Take 1 tablet (10 mg total) by mouth at bedtime. 30 tablet 5  . metolazone (ZAROXOLYN) 2.5 MG tablet 1 tab po daily 1/2 hour prior to Lasix prn pedal edema, increasing SOB, weight gain >3# 20 tablet 1  . montelukast (SINGULAIR) 10 MG tablet Take 1 tablet (10 mg total) by mouth at bedtime as needed. 30 tablet 3  . Niacin (VITAMIN B-3 PO) Take 1 tablet by mouth daily.    . OXYGEN Inhale into the lungs.    . pantoprazole (PROTONIX) 40 MG tablet TAKE 1 TABLET TWICE DAILY 180 tablet 1  . potassium chloride SA (K-DUR,KLOR-CON) 20 MEQ tablet TAKE 1 TABLET EVERY DAY 90 tablet 2  . pravastatin (PRAVACHOL) 20 MG tablet TAKE 1 TABLET EVERY DAY 90 tablet 1  . Probiotic Product (PROBIOTIC PO) Take by mouth daily.    . ranitidine (ZANTAC) 300 MG tablet TAKE 1 TABLET AT BEDTIME 90 tablet 3  . sertraline (ZOLOFT) 100 MG tablet TAKE 1 TABLET (100 MG TOTAL) BY MOUTH  DAILY. 90 tablet 3  . traMADol (ULTRAM) 50 MG tablet Take 1 tablet (50 mg total) by mouth 3 (three) times daily as needed. 90 tablet 0  . SPIRIVA HANDIHALER 18 MCG inhalation capsule PLACE 1 CAPSULE (  18 MCG TOTAL) INTO INHALER AND INHALE CONTENTS ONE TIME DAILY. (Patient not taking: Reported on 06/19/2016) 90 capsule 1  . benzonatate (TESSALON) 100 MG capsule Take 1 capsule (100 mg total) by mouth 3 (three) times daily as needed for cough. 21 capsule 0  . metoprolol succinate (TOPROL-XL) 50 MG 24 hr tablet Take 75 mg (1.5 tablets) daily. 135 tablet 3   No facility-administered medications prior to visit.     Allergies  Allergen Reactions  . Oseltamivir Phosphate     REACTION: nausea, vomiting, diarrhea, dizziness  . Sulfa Antibiotics Other (See Comments)    CP  . Ace Inhibitors   . Indomethacin   . Montelukast Sodium     REACTION: Heart palpitations, chest pain  . Sulfonamide Derivatives     Review of Systems  Constitutional: Positive for malaise/fatigue. Negative for fever.  HENT: Negative for congestion.   Eyes: Negative for blurred vision.  Respiratory: Negative for shortness of breath.   Cardiovascular: Negative for chest pain, palpitations and leg swelling.  Gastrointestinal: Negative for abdominal pain, blood in stool and nausea.  Genitourinary: Negative for dysuria and frequency.  Musculoskeletal: Positive for joint pain. Negative for falls.  Skin: Positive for rash.  Neurological: Negative for dizziness, loss of consciousness and headaches.  Endo/Heme/Allergies: Negative for environmental allergies.  Psychiatric/Behavioral: Negative for depression. The patient is not nervous/anxious.        Objective:    Physical Exam  Constitutional: She is oriented to person, place, and time. She appears well-developed and well-nourished. No distress.  HENT:  Head: Normocephalic and atraumatic.  Nose: Nose normal.  Eyes: Right eye exhibits no discharge. Left eye exhibits no  discharge.  Neck: Normal range of motion. Neck supple.  Cardiovascular: Normal rate and regular rhythm.   No murmur heard. Pulmonary/Chest: Effort normal and breath sounds normal.  Abdominal: Soft. Bowel sounds are normal. There is no tenderness.  Musculoskeletal: She exhibits no edema.  Neurological: She is alert and oriented to person, place, and time.  Skin: Skin is warm and dry.  B/l tibial plateaus erythematous and fluctuant. No skin breakdown but swelling noted  Psychiatric: She has a normal mood and affect.  Nursing note and vitals reviewed.   BP 100/68 (BP Location: Left Arm, Patient Position: Sitting, Cuff Size: Normal)   Pulse 88   Temp 98.1 F (36.7 C) (Oral)   Resp 18   Wt 211 lb (95.7 kg)   SpO2 96%   BMI 35.11 kg/m  Wt Readings from Last 3 Encounters:  06/19/16 211 lb (95.7 kg)  06/03/16 215 lb (97.5 kg)  05/19/16 219 lb 6.4 oz (99.5 kg)   BP Readings from Last 3 Encounters:  06/19/16 100/68  06/03/16 118/68  05/19/16 135/77     Immunization History  Administered Date(s) Administered  . Influenza Split 12/08/2011  . Influenza Whole 10/20/2006, 11/09/2007, 11/14/2008, 10/30/2009  . Influenza, High Dose Seasonal PF 09/25/2015, 10/21/2015  . Influenza,inj,Quad PF,36+ Mos 10/20/2012, 11/28/2013, 10/02/2014  . Pneumococcal Conjugate-13 02/21/2013  . Pneumococcal Polysaccharide-23 06/30/2006  . Td 06/01/2007    Health Maintenance  Topic Date Due  . URINE MICROALBUMIN  03/01/2015  . FOOT EXAM  04/09/2016  . OPHTHALMOLOGY EXAM  10/07/2016 (Originally 10/08/2015)  . INFLUENZA VACCINE  08/20/2016  . HEMOGLOBIN A1C  10/08/2016  . TETANUS/TDAP  05/31/2017  . COLONOSCOPY  11/10/2018  . DEXA SCAN  Completed  . PNA vac Low Risk Adult  Completed    Lab Results  Component Value Date  WBC 9.4 04/07/2016   HGB 10.7 (L) 04/07/2016   HCT 33.3 (L) 04/07/2016   PLT 159.0 04/07/2016   GLUCOSE 130 (H) 06/02/2016   CHOL 128 04/07/2016   TRIG 130.0 04/07/2016    HDL 51.80 04/07/2016   LDLDIRECT 142.7 12/16/2005   LDLCALC 50 04/07/2016   ALT 10 06/02/2016   AST 16 06/02/2016   NA 140 06/02/2016   K 4.7 06/02/2016   CL 99 06/02/2016   CREATININE 1.69 (H) 06/02/2016   BUN 34 (H) 06/02/2016   CO2 34 (H) 06/02/2016   TSH 4.77 (H) 04/07/2016   INR 1.0 09/12/2006   HGBA1C 5.9 04/07/2016   MICROALBUR 0.8 02/28/2014    Lab Results  Component Value Date   TSH 4.77 (H) 04/07/2016   Lab Results  Component Value Date   WBC 9.4 04/07/2016   HGB 10.7 (L) 04/07/2016   HCT 33.3 (L) 04/07/2016   MCV 94.4 04/07/2016   PLT 159.0 04/07/2016   Lab Results  Component Value Date   NA 140 06/02/2016   K 4.7 06/02/2016   CO2 34 (H) 06/02/2016   GLUCOSE 130 (H) 06/02/2016   BUN 34 (H) 06/02/2016   CREATININE 1.69 (H) 06/02/2016   BILITOT 0.5 06/02/2016   ALKPHOS 47 06/02/2016   AST 16 06/02/2016   ALT 10 06/02/2016   PROT 7.3 06/02/2016   ALBUMIN 4.1 06/02/2016   CALCIUM 10.8 (H) 06/02/2016   ANIONGAP 7 02/14/2016   GFR 31.34 (L) 06/02/2016   Lab Results  Component Value Date   CHOL 128 04/07/2016   Lab Results  Component Value Date   HDL 51.80 04/07/2016   Lab Results  Component Value Date   LDLCALC 50 04/07/2016   Lab Results  Component Value Date   TRIG 130.0 04/07/2016   Lab Results  Component Value Date   CHOLHDL 2 04/07/2016   Lab Results  Component Value Date   HGBA1C 5.9 04/07/2016         Assessment & Plan:   Problem List Items Addressed This Visit    Essential hypertension - Primary    Well controlled, no changes to meds. Encouraged heart healthy diet such as the DASH diet and exercise as tolerated.       Relevant Medications   metoprolol succinate (TOPROL-XL) 50 MG 24 hr tablet   Diabetes type 2, controlled (HCC)    hgba1c acceptable, minimize simple carbs. Increase exercise as tolerated. Continue current meds      Hyperlipidemia, mixed    Encouraged heart healthy diet, increase exercise, avoid trans  fats, consider a krill oil cap daily      Relevant Medications   metoprolol succinate (TOPROL-XL) 50 MG 24 hr tablet   Cellulitis    Started on antibiotics keep the skin clean and dry, elevate, report if worsens and seek care.          I have discontinued Ms. Kolek's benzonatate. I am also having her start on cephALEXin. Additionally, I am having her maintain her glucose blood, acetaminophen, Niacin (VITAMIN B-3 PO), Cyanocobalamin (VITAMIN B-12 PO), OXYGEN, b complex vitamins, Probiotic Product (PROBIOTIC PO), budesonide-formoterol, ranitidine, aspirin EC, albuterol, montelukast, pravastatin, potassium chloride SA, Ferrous Fumarate-Folic Acid, allopurinol, SPIRIVA HANDIHALER, loratadine, fluticasone, famotidine, pantoprazole, albuterol, furosemide, metolazone, traMADol, sertraline, and metoprolol succinate.  Meds ordered this encounter  Medications  . metoprolol succinate (TOPROL-XL) 50 MG 24 hr tablet    Sig: Take 75 mg (1.5 tablets) daily.    Dispense:  135 tablet  Refill:  3  . cephALEXin (KEFLEX) 500 MG capsule    Sig: Take 1 capsule (500 mg total) by mouth 4 (four) times daily.    Dispense:  40 capsule    Refill:  0    CMA served as scribe during this visit. History, Physical and Plan performed by medical provider. Documentation and orders reviewed and attested to.  Penni Homans, MD

## 2016-06-20 DIAGNOSIS — J4520 Mild intermittent asthma, uncomplicated: Secondary | ICD-10-CM | POA: Diagnosis not present

## 2016-06-20 DIAGNOSIS — R0902 Hypoxemia: Secondary | ICD-10-CM | POA: Diagnosis not present

## 2016-06-22 ENCOUNTER — Encounter: Payer: Self-pay | Admitting: Family Medicine

## 2016-06-22 DIAGNOSIS — L03119 Cellulitis of unspecified part of limb: Secondary | ICD-10-CM | POA: Insufficient documentation

## 2016-06-22 DIAGNOSIS — L0390 Cellulitis, unspecified: Secondary | ICD-10-CM

## 2016-06-22 HISTORY — DX: Cellulitis, unspecified: L03.90

## 2016-06-22 NOTE — Assessment & Plan Note (Signed)
Encouraged heart healthy diet, increase exercise, avoid trans fats, consider a krill oil cap daily 

## 2016-06-22 NOTE — Assessment & Plan Note (Signed)
Started on antibiotics keep the skin clean and dry, elevate, report if worsens and seek care.

## 2016-06-22 NOTE — Assessment & Plan Note (Signed)
hgba1c acceptable, minimize simple carbs. Increase exercise as tolerated. Continue current meds 

## 2016-06-22 NOTE — Assessment & Plan Note (Signed)
Well controlled, no changes to meds. Encouraged heart healthy diet such as the DASH diet and exercise as tolerated.  °

## 2016-07-01 ENCOUNTER — Encounter: Payer: Self-pay | Admitting: Family Medicine

## 2016-07-01 NOTE — Progress Notes (Signed)
Macula treatment: Marginal blepharitis; both lids Dermatochalasis; upper lid Dry eye syndrome; both lids  corneal arcus; both lids Pseudophakia; both ids Bilateral; non exudative macular degeneration. Early dry stage the severity of the macular condition is mild. Low risk of progression. Condition is stable.

## 2016-07-08 ENCOUNTER — Other Ambulatory Visit: Payer: Self-pay | Admitting: Family Medicine

## 2016-07-08 MED ORDER — BUDESONIDE-FORMOTEROL FUMARATE 160-4.5 MCG/ACT IN AERO: INHALATION_SPRAY | RESPIRATORY_TRACT | 1 refills | Status: DC

## 2016-07-15 ENCOUNTER — Telehealth: Payer: Self-pay | Admitting: Family Medicine

## 2016-07-15 NOTE — Telephone Encounter (Signed)
[employer] called says pt is requesting Ventilin HFA inhaler script. Per [employer] pt last had it 2017. Per [employer] no prior auth required. [employer] ph [phone] if we have any questions.

## 2016-07-17 NOTE — Telephone Encounter (Signed)
[facility] to refill Ventoling 2 puffs po qid prn sob, wheeze or cough, disp #1 with 2 rf

## 2016-07-17 NOTE — Telephone Encounter (Signed)
Please advise 

## 2016-07-18 MED ORDER — ALBUTEROL SULFATE HFA 108 (90 BASE) MCG/ACT IN AERS: 2.0000 | INHALATION_SPRAY | Freq: Four times a day (QID) | RESPIRATORY_TRACT | 2 refills | Status: DC | PRN

## 2016-07-18 NOTE — Addendum Note (Signed)
Addended by: Magdalene Molly A on: 07/18/2016 03:20 PM   Modules accepted: Orders

## 2016-07-19 ENCOUNTER — Other Ambulatory Visit: Payer: Self-pay | Admitting: Family Medicine

## 2016-07-20 DIAGNOSIS — J4520 Mild intermittent asthma, uncomplicated: Secondary | ICD-10-CM | POA: Diagnosis not present

## 2016-07-20 DIAGNOSIS — R0902 Hypoxemia: Secondary | ICD-10-CM | POA: Diagnosis not present

## 2016-07-21 NOTE — Telephone Encounter (Signed)
Pharmacy request for tramadol   Last written: 05/22/16 Last ov: 06/19/16 Next ov: 07/24/16 Contract: 03/21/16 UDS: due 09/21/16

## 2016-07-22 ENCOUNTER — Encounter: Payer: Self-pay | Admitting: *Deleted

## 2016-07-22 ENCOUNTER — Other Ambulatory Visit: Payer: Self-pay | Admitting: *Deleted

## 2016-07-22 MED ORDER — ALBUTEROL SULFATE HFA 108 (90 BASE) MCG/ACT IN AERS: 2.0000 | INHALATION_SPRAY | Freq: Four times a day (QID) | RESPIRATORY_TRACT | 0 refills | Status: DC | PRN

## 2016-07-22 NOTE — Telephone Encounter (Signed)
rx faxed to [facility] [place]

## 2016-07-24 ENCOUNTER — Encounter: Payer: Self-pay | Admitting: Family Medicine

## 2016-07-24 ENCOUNTER — Ambulatory Visit (INDEPENDENT_AMBULATORY_CARE_PROVIDER_SITE_OTHER): Payer: Medicare HMO | Admitting: Family Medicine

## 2016-07-24 VITALS — BP 116/60 | HR 107 | Temp 97.7°F | Resp 16 | Ht 65.0 in | Wt 208.0 lb

## 2016-07-24 DIAGNOSIS — I10 Essential (primary) hypertension: Secondary | ICD-10-CM

## 2016-07-24 DIAGNOSIS — D649 Anemia, unspecified: Secondary | ICD-10-CM

## 2016-07-24 DIAGNOSIS — E118 Type 2 diabetes mellitus with unspecified complications: Secondary | ICD-10-CM

## 2016-07-24 DIAGNOSIS — R600 Localized edema: Secondary | ICD-10-CM | POA: Diagnosis not present

## 2016-07-24 DIAGNOSIS — J441 Chronic obstructive pulmonary disease with (acute) exacerbation: Secondary | ICD-10-CM

## 2016-07-24 MED ORDER — FUROSEMIDE 40 MG PO TABS: ORAL_TABLET | ORAL | 5 refills | Status: DC

## 2016-07-24 MED ORDER — POTASSIUM CHLORIDE CRYS ER 20 MEQ PO TBCR: 20.0000 meq | EXTENDED_RELEASE_TABLET | Freq: Every day | ORAL | 5 refills | Status: DC

## 2016-07-24 MED ORDER — FERROUS FUMARATE-FOLIC ACID 324-1 MG PO TABS: 1.0000 | ORAL_TABLET | Freq: Every day | ORAL | 1 refills | Status: DC

## 2016-07-24 MED ORDER — METHYLPREDNISOLONE 4 MG PO TABS: ORAL_TABLET | ORAL | 0 refills | Status: DC

## 2016-07-24 MED ORDER — DOXYCYCLINE HYCLATE 100 MG PO TABS: 100.0000 mg | ORAL_TABLET | Freq: Two times a day (BID) | ORAL | 0 refills | Status: DC

## 2016-07-24 MED FILL — FUROSEMIDE 40 MG TAB: 40 | 50 days supply | Qty: 100 | Fill #0

## 2016-07-24 NOTE — Progress Notes (Signed)
Patient ID: [name], female   DOB: [birth year], 76 y.o.   MRN: [id number]     Subjective:  I acted as a Education administrator for Dr. Charlett Blake.  Guerry Bruin, [place].   Patient ID: [name], female    DOB: October 31, 1940, 76 y.o.   MRN: [id number]  Chief Complaint  Patient presents with  . Cellulitis    both legs  . Wheezing    HPI  Patient is in today for follow up cellulitis and wheezing.  She states that the cellulitis went away and came back after taking antibiotic.  She also had some wheezing with some heaviness in her chest. No fevers or chills but malaise and fatigue are noted. She is noting some increased redness and swelling over lower leg the past 24 hours. She denies polyuria or polydipsia. Denies CP/palp/HA/fevers/GI or GU c/o. Taking meds as prescribed  Patient Care Team: Mosie Lukes, MD as PCP - General (Family Medicine) Elsie Stain, MD as Consulting Physician (Pulmonary Disease) Shirley Muscat Loreen Freud, MD as Referring Physician (Optometry) Parrett, Fonnie Mu, NP as Nurse Practitioner (Pulmonary Disease) Bo Merino, MD as Consulting Physician (Rheumatology)   Past Medical History:  Diagnosis Date  . Abnormal glucose tolerance test   . ACE-inhibitor cough   . Anemia 03/12/2014  . Arthritis   . Asthma    PFT 02/06/09 FEV1 1.41 (65%), FVC 1.92 (64), FEV1% 74, TLC 3.47 (71%), DLCO 48%, +BD  . Atypical chest pain    s/p cath, Normal coronaries, Non ST elevation myocardial infarction, Rt groin pseudoaneurysm  . Bacterial vaginosis 03/12/2014  . Cellulitis 06/22/2016  . COPD (chronic obstructive pulmonary disease) ([place])   . Depression   . Diastolic heart failure ([place]) 04/07/2016  . DVT (deep venous thrombosis) ([place]) 1987  . Fall at home 12/04/2014  . GERD (gastroesophageal reflux disease)   . Gout   . Hypercalcemia 10/15/2014  . Hyperlipidemia   . Hypertension   . Hypoxia 10/15/2014  . Macular degeneration 04/10/2015  . Osteopenia 12/29/2006   Qualifier: Diagnosis of  By: Wynona Luna   . Pneumonia   . 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 Sun-damaged skin 10/24/2012  . Unspecified constipation 06/05/2013  . Urinary frequency 10/24/2012  . UTI (lower urinary tract infection) 10/24/2012  . Vitamin D deficiency 01/01/2015    Past Surgical History:  Procedure Laterality Date  . APPENDECTOMY  1951  . TONSILLECTOMY  1950  . TUBAL LIGATION  1968    Family History  Problem Relation Age of Onset  . Asthma Sister   . Arthritis Sister   . Hypertension Sister   . Hyperlipidemia Sister   . Uterine cancer Sister   . Coronary artery disease Brother        x2  . Arthritis Brother   . Lung cancer Brother        smoker  . Hypertension Sister   . Arthritis Sister   . Hyperlipidemia Sister   . Emphysema Sister   . COPD Sister        smoker  . Heart disease Sister   . Hyperlipidemia Sister   . Hypertension Sister   . Arthritis Sister   . Emphysema Brother   . Heart disease Brother         smoker  . Heart attack Brother   . Mental illness Father   . Alcohol abuse Father   . Suicidality Father   . Heart disease Mother   . Hyperlipidemia Mother   .  Heart attack Mother   . Epilepsy Daughter   . Hypertension Daughter   . Obesity Daughter   . COPD Brother        smoker  . Lung cancer Brother   . Coronary artery disease Other   . Colon polyps Sister     Social History   Social History  . Marital status: Widowed    Spouse name: N/A  . Number of children: 1  . Years of education: N/A   Occupational History  . Retired [employer]   Social History Main Topics  . Smoking status: Never Smoker  . Smokeless tobacco: Never Used  . Alcohol use No  . Drug use: No  . Sexual activity: No     Comment: lives alone, no dietary restrictions   Other Topics Concern  . Not on file   Social History Narrative  . No narrative on file    Outpatient Medications Prior to Visit  Medication Sig Dispense Refill  . acetaminophen (TYLENOL) 500 MG tablet  Take 500 mg by mouth every 6 (six) hours as needed.    Marland Kitchen albuterol (VENTOLIN HFA) 108 (90 Base) MCG/ACT inhaler Inhale 2 puffs into the lungs 4 (four) times daily as needed for wheezing or shortness of breath. 3 Inhaler 0  . allopurinol (ZYLOPRIM) 100 MG tablet Take 2 tablets (200 mg total) by mouth daily. 180 tablet 1  . aspirin EC 81 MG tablet Take 1 tablet (81 mg total) by mouth daily.    Marland Kitchen b complex vitamins tablet Take 1 tablet by mouth daily.    . budesonide-formoterol (SYMBICORT) 160-4.5 MCG/ACT inhaler INHALE 2 PUFFS INTO THE LUNGS 2 TIMES DAILY 3 Inhaler 1  . Cyanocobalamin (VITAMIN B-12 PO) Take by mouth daily.    . famotidine (PEPCID) 20 MG tablet Take 1 tablet (20 mg total) by mouth at bedtime. 30 tablet 0  . Ferrous Fumarate-Folic Acid (HEMOCYTE-F) 324-1 MG TABS Take 1 tablet by mouth daily. 30 each 2  . fluticasone (FLONASE) 50 MCG/ACT nasal spray Place 1 spray into both nostrils daily. 16 g 3  . furosemide (LASIX) 40 MG tablet Take 1 tablet (40 mg total) by mouth daily. 90 tablet 3  . glucose blood (ONE TOUCH TEST STRIPS) test strip Use as instructed to check blood sugar once weekly. DX 790.29     . loratadine (CLARITIN) 10 MG tablet Take 1 tablet (10 mg total) by mouth at bedtime. 30 tablet 5  . metolazone (ZAROXOLYN) 2.5 MG tablet 1 tab po daily 1/2 hour prior to Lasix prn pedal edema, increasing SOB, weight gain >3# 20 tablet 1  . metoprolol succinate (TOPROL-XL) 50 MG 24 hr tablet Take 75 mg (1.5 tablets) daily. 135 tablet 3  . montelukast (SINGULAIR) 10 MG tablet Take 1 tablet (10 mg total) by mouth at bedtime as needed. 30 tablet 3  . Niacin (VITAMIN B-3 PO) Take 1 tablet by mouth daily.    . OXYGEN Inhale into the lungs.    . pantoprazole (PROTONIX) 40 MG tablet TAKE 1 TABLET TWICE DAILY 180 tablet 1  . potassium chloride SA (K-DUR,KLOR-CON) 20 MEQ tablet TAKE 1 TABLET EVERY DAY 90 tablet 2  . pravastatin (PRAVACHOL) 20 MG tablet TAKE 1 TABLET EVERY DAY 90 tablet 1  .  Probiotic Product (PROBIOTIC PO) Take by mouth daily.    . ranitidine (ZANTAC) 300 MG tablet TAKE 1 TABLET AT BEDTIME 90 tablet 3  . sertraline (ZOLOFT) 100 MG tablet TAKE 1 TABLET (100 MG TOTAL) BY  MOUTH DAILY. 90 tablet 3  . SPIRIVA HANDIHALER 18 MCG inhalation capsule PLACE 1 CAPSULE (18 MCG TOTAL) INTO INHALER AND INHALE CONTENTS ONE TIME DAILY. 90 capsule 1  . traMADol (ULTRAM) 50 MG tablet TAKE 1 TABLET BY MOUTH THREE TIMES DAILY AS NEEDED 90 tablet 0  . cephALEXin (KEFLEX) 500 MG capsule Take 1 capsule (500 mg total) by mouth 4 (four) times daily. 40 capsule 0   No facility-administered medications prior to visit.     Allergies  Allergen Reactions  . Oseltamivir Phosphate     REACTION: nausea, vomiting, diarrhea, dizziness  . Sulfa Antibiotics Other (See Comments)    CP  . Ace Inhibitors   . Indomethacin   . Montelukast Sodium     REACTION: Heart palpitations, chest pain  . Sulfonamide Derivatives     Review of Systems  Constitutional: Positive for malaise/fatigue. Negative for fever.  HENT: Negative for congestion.   Eyes: Negative for blurred vision.  Respiratory: Positive for shortness of breath and wheezing. Negative for cough.   Cardiovascular: Negative for chest pain, palpitations and leg swelling.  Gastrointestinal: Negative for vomiting.  Musculoskeletal: Negative for back pain.  Skin: Negative for rash.       Blisters on both legs.  Neurological: Negative for loss of consciousness and headaches.       Objective:    Physical Exam  Constitutional: She is oriented to person, place, and time. She appears well-developed and well-nourished. No distress.  HENT:  Head: Normocephalic and atraumatic.  Nose: Nose normal.  Eyes: Right eye exhibits no discharge. Left eye exhibits no discharge.  Neck: Normal range of motion. Neck supple.  Cardiovascular: Normal rate and regular rhythm.   Murmur heard. Pulmonary/Chest: Effort normal and breath sounds normal.    Abdominal: Soft. Bowel sounds are normal. There is no tenderness.  Musculoskeletal: She exhibits no edema.  Neurological: She is alert and oriented to person, place, and time.  Skin: Skin is warm and dry.  Psychiatric: She has a normal mood and affect.  Nursing note and vitals reviewed.   BP 116/60 (BP Location: Left Arm, Cuff Size: Normal)   Pulse (!) 107   Temp 97.7 F (36.5 C) (Oral)   Resp 16   Ht 5\' 5"  (1.651 m)   Wt 208 lb (94.3 kg)   SpO2 95%   BMI 34.61 kg/m  Wt Readings from Last 3 Encounters:  07/24/16 208 lb (94.3 kg)  06/19/16 211 lb (95.7 kg)  06/03/16 215 lb (97.5 kg)   BP Readings from Last 3 Encounters:  07/24/16 116/60  06/19/16 100/68  06/03/16 118/68     Immunization History  Administered Date(s) Administered  . Influenza Split 12/08/2011  . Influenza Whole 10/20/2006, 11/09/2007, 11/14/2008, 10/30/2009  . Influenza, High Dose Seasonal PF 09/25/2015, 10/21/2015  . Influenza,inj,Quad PF,36+ Mos 10/20/2012, 11/28/2013, 10/02/2014  . Pneumococcal Conjugate-13 02/21/2013  . Pneumococcal Polysaccharide-23 06/30/2006  . Td 06/01/2007    Health Maintenance  Topic Date Due  . URINE MICROALBUMIN  03/01/2015  . FOOT EXAM  04/09/2016  . OPHTHALMOLOGY EXAM  10/07/2016 (Originally 10/08/2015)  . INFLUENZA VACCINE  08/20/2016  . HEMOGLOBIN A1C  10/08/2016  . TETANUS/TDAP  05/31/2017  . COLONOSCOPY  11/10/2018  . DEXA SCAN  Completed  . PNA vac Low Risk Adult  Completed    Lab Results  Component Value Date   WBC 9.4 04/07/2016   HGB 10.7 (L) 04/07/2016   HCT 33.3 (L) 04/07/2016   PLT 159.0 04/07/2016  GLUCOSE 130 (H) 06/02/2016   CHOL 128 04/07/2016   TRIG 130.0 04/07/2016   HDL 51.80 04/07/2016   LDLDIRECT 142.7 12/16/2005   LDLCALC 50 04/07/2016   ALT 10 06/02/2016   AST 16 06/02/2016   NA 140 06/02/2016   K 4.7 06/02/2016   CL 99 06/02/2016   CREATININE 1.69 (H) 06/02/2016   BUN 34 (H) 06/02/2016   CO2 34 (H) 06/02/2016   TSH 4.77 (H)  04/07/2016   INR 1.0 09/12/2006   HGBA1C 5.9 04/07/2016   MICROALBUR 0.8 02/28/2014    Lab Results  Component Value Date   TSH 4.77 (H) 04/07/2016   Lab Results  Component Value Date   WBC 9.4 04/07/2016   HGB 10.7 (L) 04/07/2016   HCT 33.3 (L) 04/07/2016   MCV 94.4 04/07/2016   PLT 159.0 04/07/2016   Lab Results  Component Value Date   NA 140 06/02/2016   K 4.7 06/02/2016   CO2 34 (H) 06/02/2016   GLUCOSE 130 (H) 06/02/2016   BUN 34 (H) 06/02/2016   CREATININE 1.69 (H) 06/02/2016   BILITOT 0.5 06/02/2016   ALKPHOS 47 06/02/2016   AST 16 06/02/2016   ALT 10 06/02/2016   PROT 7.3 06/02/2016   ALBUMIN 4.1 06/02/2016   CALCIUM 10.8 (H) 06/02/2016   ANIONGAP 7 02/14/2016   GFR 31.34 (L) 06/02/2016   Lab Results  Component Value Date   CHOL 128 04/07/2016   Lab Results  Component Value Date   HDL 51.80 04/07/2016   Lab Results  Component Value Date   LDLCALC 50 04/07/2016   Lab Results  Component Value Date   TRIG 130.0 04/07/2016   Lab Results  Component Value Date   CHOLHDL 2 04/07/2016   Lab Results  Component Value Date   HGBA1C 5.9 04/07/2016         Assessment & Plan:   Problem List Items Addressed This Visit    None      I have discontinued Ms. Rambert's cephALEXin. I am also having her maintain her glucose blood, acetaminophen, Niacin (VITAMIN B-3 PO), Cyanocobalamin (VITAMIN B-12 PO), OXYGEN, b complex vitamins, Probiotic Product (PROBIOTIC PO), ranitidine, aspirin EC, montelukast, pravastatin, potassium chloride SA, Ferrous Fumarate-Folic Acid, allopurinol, SPIRIVA HANDIHALER, loratadine, fluticasone, famotidine, pantoprazole, furosemide, metolazone, sertraline, metoprolol succinate, budesonide-formoterol, traMADol, and albuterol.  No orders of the defined types were placed in this encounter.   CMA served as Education administrator during this visit. History, Physical and Plan performed by medical provider. Documentation and orders reviewed and attested  to.  Jerene Dilling, CMA

## 2016-07-24 NOTE — Patient Instructions (Signed)
Chronic Obstructive Pulmonary Disease Exacerbation  Chronic obstructive pulmonary disease (COPD) is a common lung problem. In COPD, the flow of air from the lungs is limited. COPD exacerbations are times that breathing gets worse and you need extra treatment. Without treatment they can be life threatening. If they happen often, your lungs can become more damaged. If your COPD gets worse, your doctor may treat you with:  ? Medicines.  ? Oxygen.  ? Different ways to clear your airway, such as using a mask.    Follow these instructions at home:  ? Do not smoke.  ? Avoid tobacco smoke and other things that bother your lungs.  ? If given, take your antibiotic medicine as told. Finish the medicine even if you start to feel better.  ? Only take medicines as told by your doctor.  ? Drink enough fluids to keep your pee (urine) clear or pale yellow (unless your doctor has told you not to).  ? Use a cool mist machine (vaporizer).  ? If you use oxygen or a machine that turns liquid medicine into a mist (nebulizer), continue to use them as told.  ? Keep up with shots (vaccinations) as told by your doctor.  ? Exercise regularly.  ? Eat healthy foods.  ? Keep all doctor visits as told.  Get help right away if:  ? You are very short of breath and it gets worse.  ? You have trouble talking.  ? You have bad chest pain.  ? You have blood in your spit (sputum).  ? You have a fever.  ? You keep throwing up (vomiting).  ? You feel weak, or you pass out (faint).  ? You feel confused.  ? You keep getting worse.  This information is not intended to replace advice given to you by your health care provider. Make sure you discuss any questions you have with your health care provider.  Document Released: 12/26/2010 Document Revised: 06/14/2015 Document Reviewed: 09/10/2012  Elsevier Interactive Patient Education ? 2017 [employer]

## 2016-07-27 DIAGNOSIS — J441 Chronic obstructive pulmonary disease with (acute) exacerbation: Secondary | ICD-10-CM | POA: Insufficient documentation

## 2016-07-27 NOTE — Assessment & Plan Note (Signed)
Well controlled, no changes to meds. Encouraged heart healthy diet such as the DASH diet and exercise as tolerated.  °

## 2016-07-27 NOTE — Assessment & Plan Note (Signed)
Recent exacerbation with possible early cellulitis on leg as well. Started on Doxycycline she will notify us if symptoms worsen

## 2016-07-27 NOTE — Assessment & Plan Note (Signed)
Increase leafy greens, consider increased lean red meat and using cast iron cookware. Continue to monitor, report any concerns and continue iron supplement

## 2016-07-27 NOTE — Assessment & Plan Note (Signed)
hgba1c acceptable, minimize simple carbs. Increase exercise as tolerated.  

## 2016-08-04 ENCOUNTER — Encounter: Payer: Self-pay | Admitting: Cardiology

## 2016-08-04 ENCOUNTER — Telehealth: Payer: Self-pay | Admitting: Cardiology

## 2016-08-04 ENCOUNTER — Ambulatory Visit (INDEPENDENT_AMBULATORY_CARE_PROVIDER_SITE_OTHER): Payer: Medicare HMO | Admitting: Cardiology

## 2016-08-04 VITALS — BP 116/58 | HR 96 | Ht 65.0 in | Wt 209.0 lb

## 2016-08-04 DIAGNOSIS — I5032 Chronic diastolic (congestive) heart failure: Secondary | ICD-10-CM | POA: Diagnosis not present

## 2016-08-04 DIAGNOSIS — N184 Chronic kidney disease, stage 4 (severe): Secondary | ICD-10-CM | POA: Insufficient documentation

## 2016-08-04 DIAGNOSIS — N183 Chronic kidney disease, stage 3 unspecified: Secondary | ICD-10-CM

## 2016-08-04 DIAGNOSIS — I10 Essential (primary) hypertension: Secondary | ICD-10-CM

## 2016-08-04 DIAGNOSIS — R600 Localized edema: Secondary | ICD-10-CM

## 2016-08-04 HISTORY — DX: Chronic kidney disease, stage 3 unspecified: N18.30

## 2016-08-04 NOTE — Telephone Encounter (Signed)
Reviewed Dr. Theodosia Blender note in grate detail with patient and she has no further questions. She was grateful for call and time spent.

## 2016-08-04 NOTE — Progress Notes (Signed)
Cardiology Office Note    Date:  08/04/2016   ID:  [name], DOB [birth year], MRN [id number]  PCP:  Mosie Lukes, MD  Cardiologist:  Fransico Him, MD   Chief Complaint  Patient presents with  . Congestive Heart Failure  . Hypertension    History of Present Illness:  [name] is a 76 y.o. female with a history of asthma, atypical CP with normal coronary arteries by cath in 2008 in the setting of NSTEMI complicated by right groin pseudoaneurysm, chronic diastolic CHF, GERD, HTN and hyperlipidemia.  She has a history of DVT in the past as well as O2 dependent COPD on 2L [place].   When I last saw her she was complaining of  increased SOB and LE edema in setting of dietary indiscretion with sodium.  Her lasix was increased to 40mg  daily.  Her creatinine had also increased to 2 and her losartan was stopped and her metoprolol was increased to 75mg  BID for better BP control.  A repeat echo showed normal LVF with mild MR and grade I diastolic dysfunction.   She was seen back by Lyda Jester, PA and her edema and SOB had improved and weight decreased 5lbs.  Her creatinine dropped back to 1.51.  She is now here today for followup.  Her chronic SOB due to COPD is stable and at baseline.  She denies any PND or orthopnea.  She occasionally has some dizziness when standing up too fast.  She denies any palpitations or syncope.  Her LE edema is controlled on diuretics but still present.  She tries not to sit for long periods of time. She cannot use compression hose right now due to sores on her legs. She uses extra Lasix occasionally for increased weight or edema.      Past Medical History:  Diagnosis Date  . Abnormal glucose tolerance test   . ACE-inhibitor cough   . Anemia 03/12/2014  . Arthritis   . Asthma    PFT 02/06/09 FEV1 1.41 (65%), FVC 1.92 (64), FEV1% 74, TLC 3.47 (71%), DLCO 48%, +BD  . Atypical chest pain    s/p cath, Normal coronaries, Non ST elevation myocardial  infarction, Rt groin pseudoaneurysm  . Bacterial vaginosis 03/12/2014  . Cellulitis 06/22/2016  . Chronic kidney disease (CKD), stage III (moderate) 08/04/2016  . COPD (chronic obstructive pulmonary disease) ([place])   . Depression   . Diastolic heart failure ([place]) 04/07/2016  . DVT (deep venous thrombosis) ([place]) 1987  . Fall at home 12/04/2014  . GERD (gastroesophageal reflux disease)   . Gout   . Hypercalcemia 10/15/2014  . Hyperlipidemia   . Hypertension   . Hypoxia 10/15/2014  . Macular degeneration 04/10/2015  . Osteopenia 12/29/2006   Qualifier: Diagnosis of  By: Wynona Luna   . Pneumonia   . 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 Sun-damaged skin 10/24/2012  . Unspecified constipation 06/05/2013  . Urinary frequency 10/24/2012  . UTI (lower urinary tract infection) 10/24/2012  . Vitamin D deficiency 01/01/2015    Past Surgical History:  Procedure Laterality Date  . APPENDECTOMY  1951  . TONSILLECTOMY  1950  . TUBAL LIGATION  1968    Current Medications: Current Meds  Medication Sig  . acetaminophen (TYLENOL) 500 MG tablet Take 500 mg by mouth every 6 (six) hours as needed.  Marland Kitchen albuterol (VENTOLIN HFA) 108 (90 Base) MCG/ACT inhaler Inhale 2 puffs into the lungs 4 (four) times daily as needed for wheezing or  shortness of breath.  . allopurinol (ZYLOPRIM) 100 MG tablet Take 2 tablets (200 mg total) by mouth daily.  Marland Kitchen aspirin EC 81 MG tablet Take 1 tablet (81 mg total) by mouth daily.  Marland Kitchen b complex vitamins tablet Take 1 tablet by mouth daily.  . budesonide-formoterol (SYMBICORT) 160-4.5 MCG/ACT inhaler INHALE 2 PUFFS INTO THE LUNGS 2 TIMES DAILY  . Cyanocobalamin (VITAMIN B-12 PO) Take by mouth daily.  Marland Kitchen doxycycline (VIBRA-TABS) 100 MG tablet Take 1 tablet (100 mg total) by mouth 2 (two) times daily.  . famotidine (PEPCID) 20 MG tablet Take 1 tablet (20 mg total) by mouth at bedtime.  . Ferrous Fumarate-Folic Acid (HEMOCYTE-F) 324-1 MG TABS Take 1 tablet by mouth daily.  .  fluticasone (FLONASE) 50 MCG/ACT nasal spray Place 1 spray into both nostrils daily.  . furosemide (LASIX) 40 MG tablet 1 tab po daily and a 2nd tab po daily prn increased edema, wt gain>3 #/24 hour  . glucose blood (ONE TOUCH TEST STRIPS) test strip Use as instructed to check blood sugar once weekly. DX 790.29   . loratadine (CLARITIN) 10 MG tablet Take 1 tablet (10 mg total) by mouth at bedtime.  . methylPREDNISolone (MEDROL) 4 MG tablet 5 tab po qd X 1d then 4 tab po qd X 1d then 3 tab po qd X 1d then 2 tab po qd then 1 tab po qd  . metolazone (ZAROXOLYN) 2.5 MG tablet 1 tab po daily 1/2 hour prior to Lasix prn pedal edema, increasing SOB, weight gain >3#  . metoprolol succinate (TOPROL-XL) 50 MG 24 hr tablet Take 75 mg (1.5 tablets) daily.  . montelukast (SINGULAIR) 10 MG tablet Take 1 tablet (10 mg total) by mouth at bedtime as needed.  . Niacin (VITAMIN B-3 PO) Take 1 tablet by mouth daily.  . OXYGEN Inhale into the lungs.  . pantoprazole (PROTONIX) 40 MG tablet TAKE 1 TABLET TWICE DAILY  . potassium chloride SA (K-DUR,KLOR-CON) 20 MEQ tablet Take 1 tablet (20 mEq total) by mouth daily. And a 2nd tab po daily prn extra Furosemide tab  . pravastatin (PRAVACHOL) 20 MG tablet TAKE 1 TABLET EVERY DAY  . Probiotic Product (PROBIOTIC PO) Take by mouth daily.  . ranitidine (ZANTAC) 300 MG tablet TAKE 1 TABLET AT BEDTIME  . sertraline (ZOLOFT) 100 MG tablet TAKE 1 TABLET (100 MG TOTAL) BY MOUTH DAILY.  Marland Kitchen SPIRIVA HANDIHALER 18 MCG inhalation capsule PLACE 1 CAPSULE (18 MCG TOTAL) INTO INHALER AND INHALE CONTENTS ONE TIME DAILY.  . traMADol (ULTRAM) 50 MG tablet TAKE 1 TABLET BY MOUTH THREE TIMES DAILY AS NEEDED    Allergies:   Oseltamivir phosphate; Sulfa antibiotics; Ace inhibitors; Indomethacin; Montelukast sodium; and Sulfonamide derivatives   Social History   Social History  . Marital status: Widowed    Spouse name: N/A  . Number of children: 1  . Years of education: N/A   Occupational  History  . Retired [employer]   Social History Main Topics  . Smoking status: Never Smoker  . Smokeless tobacco: Never Used  . Alcohol use No  . Drug use: No  . Sexual activity: No     Comment: lives alone, no dietary restrictions   Other Topics Concern  . None   Social History Narrative  . None     Family History:  The patient's family history includes Alcohol abuse in her father; Arthritis in her brother, sister, sister, and sister; Asthma in her sister; COPD in her brother and sister; Colon  polyps in her sister; Coronary artery disease in her brother and other; Emphysema in her brother and sister; Epilepsy in her daughter; Heart attack in her brother and mother; Heart disease in her brother, mother, and sister; Hyperlipidemia in her mother, sister, sister, and sister; Hypertension in her daughter, sister, sister, and sister; Lung cancer in her brother and brother; Mental illness in her father; Obesity in her daughter; Suicidality in her father; Uterine cancer in her sister.   ROS:   Please see the history of present illness.    ROS All other systems reviewed and are negative.  No flowsheet data found.     PHYSICAL EXAM:   VS:  BP (!) 116/58   Pulse 96   Ht 5\' 5"  (1.651 m)   Wt 209 lb (94.8 kg)   SpO2 95% Comment: with oxygen  BMI 34.78 kg/m    GEN: Well nourished, well developed, in no acute distress  HEENT: normal  Neck: no JVD, carotid bruits, or masses Cardiac: RRR; no murmurs, rubs, or gallops,no edema.  Intact distal pulses bilaterally.  Respiratory:  clear to auscultation bilaterally, normal work of breathing GI: soft, nontender, nondistended, + BS MS: no deformity or atrophy  Skin: warm and dry, no rash Neuro:  Alert and Oriented x 3, Strength and sensation are intact Psych: euthymic mood, full affect  Wt Readings from Last 3 Encounters:  08/04/16 209 lb (94.8 kg)  07/24/16 208 lb (94.3 kg)  06/19/16 211 lb (95.7 kg)      Studies/Labs Reviewed:   EKG:   EKG is not ordered today.    Recent Labs: 12/20/2015: Pro B Natriuretic peptide (BNP) 45.0 04/07/2016: Hemoglobin 10.7; Platelets 159.0; TSH 4.77 06/02/2016: ALT 10; BUN 34; Creatinine, Ser 1.69; Potassium 4.7; Sodium 140   Lipid Panel    Component Value Date/Time   CHOL 128 04/07/2016 1428   TRIG 130.0 04/07/2016 1428   TRIG 122 01/27/2006 1037   HDL 51.80 04/07/2016 1428   CHOLHDL 2 04/07/2016 1428   VLDL 26.0 04/07/2016 1428   LDLCALC 50 04/07/2016 1428   LDLDIRECT 142.7 12/16/2005 1010    Additional studies/ records that were reviewed today include:  none    ASSESSMENT:    1. Chronic diastolic CHF (congestive heart failure) ([place])   2. Essential hypertension   3. Chronic kidney disease (CKD), stage III (moderate)   4. Pedal edema      PLAN:  In order of problems listed above:  1. Chronic diastolic CHF - she appears euvolemic on exam, SOB is at baseline and her weight is stable. She will continue on current dose of Lasix.  I have encouraged her to weigh herself daily and take an extra lasix if she gains more than 3 lbs in a day or 5lbs in a week.  She still has chronic LE edema but she thinks it is stable.  She has not been able to use compression hose due to sores on her legs and is currently on an antibx with some improvement.   2. HTN - Her BP is adeqeuately controlled on Toprol 75mg  daily.  3.   CKD stage III - I will repeat a BMET today.   4.   Chronic LE edema - multifactorial from COPD, sedentary state, obesity and CHF.  She now has leg sores and is on antibx.  DP pulses are reduced.  I will get LE arterial dopplers to rule out PAD.      Medication Adjustments/Labs and Tests Ordered: Current medicines  are reviewed at length with the patient today.  Concerns regarding medicines are outlined above.  Medication changes, Labs and Tests ordered today are listed in the Patient Instructions below.  There are no Patient Instructions on file for this  visit.   Signed, Fransico Him, MD  08/04/2016 10:14 AM    [address] [address], [place], [place]  [address] Phone: [phone]; Fax: [phone]

## 2016-08-04 NOTE — Telephone Encounter (Signed)
New message    Patient was seen today by Dr. Radford Pax - after reading the AV'S summary patient has some concern that was not discussed.     1.Chronic Kidney Disease stage 3 &  Congestive Heart Failure -patient states she has never been told that     Patient wants to the nurse to call her back to discuss.

## 2016-08-04 NOTE — Patient Instructions (Signed)
Medication Instructions:  Your physician recommends that you continue on your current medications as directed. Please refer to the Current Medication list given to you today.   Labwork: TODAY: BMET  Testing/Procedures: Your physician has requested that you have a lower extremity arterial duplex. During this test, ultrasound is used to evaluate arterial blood flow in the legs. Allow one hour for this exam. There are no restrictions or special instructions.   Follow-Up: Your physician wants you to follow-up in: 6 months with Dr. Radford Pax. You will receive a reminder letter in the mail two months in advance. If you don't receive a letter, please call our office to schedule the follow-up appointment.   Any Other Special Instructions Will Be Listed Below (If Applicable).     If you need a refill on your cardiac medications before your next appointment, please call your pharmacy.

## 2016-08-05 ENCOUNTER — Ambulatory Visit: Payer: Medicare HMO | Admitting: Cardiology

## 2016-08-05 LAB — BASIC METABOLIC PANEL
BUN / CREAT RATIO: 30 — AB (ref 12–28)
BUN: 50 mg/dL — ABNORMAL HIGH (ref 8–27)
CALCIUM: 9.6 mg/dL (ref 8.7–10.3)
CHLORIDE: 98 mmol/L (ref 96–106)
CO2: 26 mmol/L (ref 20–29)
Creatinine, Ser: 1.69 mg/dL — ABNORMAL HIGH (ref 0.57–1.00)
GFR calc non Af Amer: 29 mL/min/{1.73_m2} — ABNORMAL LOW (ref >59)
GFR, EST AFRICAN AMERICAN: 34 mL/min/{1.73_m2} — AB (ref >59)
Glucose: 126 mg/dL — ABNORMAL HIGH (ref 65–99)
POTASSIUM: 4.4 mmol/L (ref 3.5–5.2)
Sodium: 142 mmol/L (ref 134–144)

## 2016-08-13 ENCOUNTER — Other Ambulatory Visit: Payer: Self-pay | Admitting: Cardiology

## 2016-08-13 DIAGNOSIS — R600 Localized edema: Secondary | ICD-10-CM

## 2016-08-18 ENCOUNTER — Ambulatory Visit (INDEPENDENT_AMBULATORY_CARE_PROVIDER_SITE_OTHER): Payer: Medicare HMO | Admitting: Family Medicine

## 2016-08-18 ENCOUNTER — Encounter: Payer: Self-pay | Admitting: Family Medicine

## 2016-08-18 VITALS — BP 118/58 | HR 84 | Temp 98.2°F | Resp 18 | Wt 209.6 lb

## 2016-08-18 DIAGNOSIS — E782 Mixed hyperlipidemia: Secondary | ICD-10-CM | POA: Diagnosis not present

## 2016-08-18 DIAGNOSIS — N183 Chronic kidney disease, stage 3 unspecified: Secondary | ICD-10-CM

## 2016-08-18 DIAGNOSIS — I10 Essential (primary) hypertension: Secondary | ICD-10-CM

## 2016-08-18 DIAGNOSIS — I5032 Chronic diastolic (congestive) heart failure: Secondary | ICD-10-CM | POA: Diagnosis not present

## 2016-08-18 DIAGNOSIS — R600 Localized edema: Secondary | ICD-10-CM

## 2016-08-18 DIAGNOSIS — R000 Tachycardia, unspecified: Secondary | ICD-10-CM

## 2016-08-18 DIAGNOSIS — E118 Type 2 diabetes mellitus with unspecified complications: Secondary | ICD-10-CM | POA: Diagnosis not present

## 2016-08-18 NOTE — Assessment & Plan Note (Signed)
Stage 1 reviewed echo with patient

## 2016-08-18 NOTE — Assessment & Plan Note (Signed)
Maintain hydration and avoid OTC meds. monitor

## 2016-08-18 NOTE — Assessment & Plan Note (Signed)
RRR today 

## 2016-08-18 NOTE — Progress Notes (Signed)
Subjective:  I acted as a Education administrator for Dr. Charlett Blake. [place], [place]  Patient ID: [name], female    DOB: [birth year], 76 y.o.   MRN: [id number]  No chief complaint on file.   HPI  Patient is in today for a 2 week follow up. She feels her edema is better and the blisters have resolved but her weight is stable. She feels well today, no recent febrile illness or hospitalizations. She has been trying to decrease sodium intake. She feels  The Lasix has been somewhat helpful but she never picked up the Zaroxolyn. Denies CP/palp/SOB/HA/congestion/fevers/GI or GU c/o. Taking meds as prescribed  Patient Care Team: Mosie Lukes, MD as PCP - General (Family Medicine) Elsie Stain, MD as Consulting Physician (Pulmonary Disease) Shirley Muscat Loreen Freud, MD as Referring Physician (Optometry) Parrett, Fonnie Mu, NP as Nurse Practitioner (Pulmonary Disease) Bo Merino, MD as Consulting Physician (Rheumatology)   Past Medical History:  Diagnosis Date  . Abnormal glucose tolerance test   . ACE-inhibitor cough   . Anemia 03/12/2014  . Arthritis   . Asthma    PFT 02/06/09 FEV1 1.41 (65%), FVC 1.92 (64), FEV1% 74, TLC 3.47 (71%), DLCO 48%, +BD  . Atypical chest pain    s/p cath, Normal coronaries, Non ST elevation myocardial infarction, Rt groin pseudoaneurysm  . Bacterial vaginosis 03/12/2014  . Cellulitis 06/22/2016  . Chronic kidney disease (CKD), stage III (moderate) 08/04/2016  . COPD (chronic obstructive pulmonary disease) ([place])   . Depression   . Diastolic heart failure ([place]) 04/07/2016  . DVT (deep venous thrombosis) ([place]) 1987  . Fall at home 12/04/2014  . GERD (gastroesophageal reflux disease)   . Gout   . Hypercalcemia 10/15/2014  . Hyperlipidemia   . Hypertension   . Hypoxia 10/15/2014  . Macular degeneration 04/10/2015  . Osteopenia 12/29/2006   Qualifier: Diagnosis of  By: Wynona Luna   . Pneumonia   . 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 Sun-damaged skin 10/24/2012   . Unspecified constipation 06/05/2013  . Urinary frequency 10/24/2012  . UTI (lower urinary tract infection) 10/24/2012  . Vitamin D deficiency 01/01/2015    Past Surgical History:  Procedure Laterality Date  . APPENDECTOMY  1951  . TONSILLECTOMY  1950  . TUBAL LIGATION  1968    Family History  Problem Relation Age of Onset  . Asthma Sister   . Arthritis Sister   . Hypertension Sister   . Hyperlipidemia Sister   . Uterine cancer Sister   . Coronary artery disease Brother        x2  . Arthritis Brother   . Lung cancer Brother        smoker  . Hypertension Sister   . Arthritis Sister   . Hyperlipidemia Sister   . Emphysema Sister   . COPD Sister        smoker  . Heart disease Sister   . Hyperlipidemia Sister   . Hypertension Sister   . Arthritis Sister   . Emphysema Brother   . Heart disease Brother         smoker  . Heart attack Brother   . Mental illness Father   . Alcohol abuse Father   . Suicidality Father   . Heart disease Mother   . Hyperlipidemia Mother   . Heart attack Mother   . Epilepsy Daughter   . Hypertension Daughter   . Obesity Daughter   . COPD Brother  smoker  . Lung cancer Brother   . Coronary artery disease Other   . Colon polyps Sister     Social History   Social History  . Marital status: Widowed    Spouse name: N/A  . Number of children: 1  . Years of education: N/A   Occupational History  . Retired [employer]   Social History Main Topics  . Smoking status: Never Smoker  . Smokeless tobacco: Never Used  . Alcohol use No  . Drug use: No  . Sexual activity: No     Comment: lives alone, no dietary restrictions   Other Topics Concern  . Not on file   Social History Narrative  . No narrative on file    Outpatient Medications Prior to Visit  Medication Sig Dispense Refill  . acetaminophen (TYLENOL) 500 MG tablet Take 500 mg by mouth every 6 (six) hours as needed.    Marland Kitchen albuterol (VENTOLIN HFA) 108 (90 Base) MCG/ACT  inhaler Inhale 2 puffs into the lungs 4 (four) times daily as needed for wheezing or shortness of breath. 3 Inhaler 0  . allopurinol (ZYLOPRIM) 100 MG tablet Take 2 tablets (200 mg total) by mouth daily. 180 tablet 1  . aspirin EC 81 MG tablet Take 1 tablet (81 mg total) by mouth daily.    Marland Kitchen b complex vitamins tablet Take 1 tablet by mouth daily.    . budesonide-formoterol (SYMBICORT) 160-4.5 MCG/ACT inhaler INHALE 2 PUFFS INTO THE LUNGS 2 TIMES DAILY 3 Inhaler 1  . Cyanocobalamin (VITAMIN B-12 PO) Take by mouth daily.    Marland Kitchen doxycycline (VIBRA-TABS) 100 MG tablet Take 1 tablet (100 mg total) by mouth 2 (two) times daily. 20 tablet 0  . famotidine (PEPCID) 20 MG tablet Take 1 tablet (20 mg total) by mouth at bedtime. 30 tablet 0  . Ferrous Fumarate-Folic Acid (HEMOCYTE-F) 324-1 MG TABS Take 1 tablet by mouth daily. 90 each 1  . fluticasone (FLONASE) 50 MCG/ACT nasal spray Place 1 spray into both nostrils daily. 16 g 3  . furosemide (LASIX) 40 MG tablet 1 tab po daily and a 2nd tab po daily prn increased edema, wt gain>3 #/24 hour 100 tablet 5  . glucose blood (ONE TOUCH TEST STRIPS) test strip Use as instructed to check blood sugar once weekly. DX 790.29     . loratadine (CLARITIN) 10 MG tablet Take 1 tablet (10 mg total) by mouth at bedtime. 30 tablet 5  . methylPREDNISolone (MEDROL) 4 MG tablet 5 tab po qd X 1d then 4 tab po qd X 1d then 3 tab po qd X 1d then 2 tab po qd then 1 tab po qd 15 tablet 0  . metolazone (ZAROXOLYN) 2.5 MG tablet 1 tab po daily 1/2 hour prior to Lasix prn pedal edema, increasing SOB, weight gain >3# 20 tablet 1  . metoprolol succinate (TOPROL-XL) 50 MG 24 hr tablet Take 75 mg (1.5 tablets) daily. 135 tablet 3  . montelukast (SINGULAIR) 10 MG tablet Take 1 tablet (10 mg total) by mouth at bedtime as needed. 30 tablet 3  . Niacin (VITAMIN B-3 PO) Take 1 tablet by mouth daily.    . OXYGEN Inhale into the lungs.    . pantoprazole (PROTONIX) 40 MG tablet TAKE 1 TABLET TWICE  DAILY 180 tablet 1  . potassium chloride SA (K-DUR,KLOR-CON) 20 MEQ tablet Take 1 tablet (20 mEq total) by mouth daily. And a 2nd tab po daily prn extra Furosemide tab 100 tablet 5  .  pravastatin (PRAVACHOL) 20 MG tablet TAKE 1 TABLET EVERY DAY 90 tablet 1  . Probiotic Product (PROBIOTIC PO) Take by mouth daily.    . ranitidine (ZANTAC) 300 MG tablet TAKE 1 TABLET AT BEDTIME 90 tablet 3  . sertraline (ZOLOFT) 100 MG tablet TAKE 1 TABLET (100 MG TOTAL) BY MOUTH DAILY. 90 tablet 3  . SPIRIVA HANDIHALER 18 MCG inhalation capsule PLACE 1 CAPSULE (18 MCG TOTAL) INTO INHALER AND INHALE CONTENTS ONE TIME DAILY. 90 capsule 1  . traMADol (ULTRAM) 50 MG tablet TAKE 1 TABLET BY MOUTH THREE TIMES DAILY AS NEEDED 90 tablet 0   No facility-administered medications prior to visit.     Allergies  Allergen Reactions  . Oseltamivir Phosphate     REACTION: nausea, vomiting, diarrhea, dizziness  . Sulfa Antibiotics Other (See Comments)    CP  . Ace Inhibitors   . Indomethacin   . Montelukast Sodium     REACTION: Heart palpitations, chest pain  . Sulfonamide Derivatives     Review of Systems  Constitutional: Positive for malaise/fatigue. Negative for fever.  HENT: Negative for congestion.   Eyes: Negative for blurred vision.  Respiratory: Negative for shortness of breath.   Cardiovascular: Positive for leg swelling. Negative for chest pain and palpitations.  Gastrointestinal: Negative for abdominal pain, blood in stool and nausea.  Genitourinary: Negative for dysuria and frequency.  Musculoskeletal: Negative for falls.  Skin: Negative for rash.  Neurological: Negative for dizziness, loss of consciousness and headaches.  Endo/Heme/Allergies: Negative for environmental allergies.  Psychiatric/Behavioral: Negative for depression. The patient is not nervous/anxious.        Objective:    Physical Exam  Constitutional: She is oriented to person, place, and time. She appears well-developed and  well-nourished. No distress.  HENT:  Head: Normocephalic and atraumatic.  Nose: Nose normal.  Eyes: Right eye exhibits no discharge. Left eye exhibits no discharge.  Neck: Normal range of motion. Neck supple.  Cardiovascular: Normal rate and regular rhythm.   No murmur heard. Pulmonary/Chest: Effort normal and breath sounds normal.  Abdominal: Soft. Bowel sounds are normal. There is no tenderness.  Musculoskeletal: She exhibits edema.  Neurological: She is alert and oriented to person, place, and time.  Skin: Skin is warm and dry.  Psychiatric: She has a normal mood and affect.  Nursing note and vitals reviewed.   BP (!) 118/58 (BP Location: Left Arm, Patient Position: Sitting, Cuff Size: Normal)   Pulse 84   Temp 98.2 F (36.8 C) (Oral)   Resp 18   Wt 209 lb 9.6 oz (95.1 kg)   SpO2 90%   BMI 34.88 kg/m  Wt Readings from Last 3 Encounters:  08/18/16 209 lb 9.6 oz (95.1 kg)  08/04/16 209 lb (94.8 kg)  07/24/16 208 lb (94.3 kg)   BP Readings from Last 3 Encounters:  08/18/16 (!) 118/58  08/04/16 (!) 116/58  07/24/16 116/60     Immunization History  Administered Date(s) Administered  . Influenza Split 12/08/2011  . Influenza Whole 10/20/2006, 11/09/2007, 11/14/2008, 10/30/2009  . Influenza, High Dose Seasonal PF 09/25/2015, 10/21/2015  . Influenza,inj,Quad PF,36+ Mos 10/20/2012, 11/28/2013, 10/02/2014  . Pneumococcal Conjugate-13 02/21/2013  . Pneumococcal Polysaccharide-23 06/30/2006  . Td 06/01/2007    Health Maintenance  Topic Date Due  . URINE MICROALBUMIN  03/01/2015  . FOOT EXAM  04/09/2016  . OPHTHALMOLOGY EXAM  10/07/2016 (Originally 10/08/2015)  . INFLUENZA VACCINE  08/20/2016  . HEMOGLOBIN A1C  10/08/2016  . TETANUS/TDAP  05/31/2017  . COLONOSCOPY  11/10/2018  . DEXA SCAN  Completed  . PNA vac Low Risk Adult  Completed    Lab Results  Component Value Date   WBC 9.4 04/07/2016   HGB 10.7 (L) 04/07/2016   HCT 33.3 (L) 04/07/2016   PLT 159.0  04/07/2016   GLUCOSE 126 (H) 08/04/2016   CHOL 128 04/07/2016   TRIG 130.0 04/07/2016   HDL 51.80 04/07/2016   LDLDIRECT 142.7 12/16/2005   LDLCALC 50 04/07/2016   ALT 10 06/02/2016   AST 16 06/02/2016   NA 142 08/04/2016   K 4.4 08/04/2016   CL 98 08/04/2016   CREATININE 1.69 (H) 08/04/2016   BUN 50 (H) 08/04/2016   CO2 26 08/04/2016   TSH 4.77 (H) 04/07/2016   INR 1.0 09/12/2006   HGBA1C 5.9 04/07/2016   MICROALBUR 0.8 02/28/2014    Lab Results  Component Value Date   TSH 4.77 (H) 04/07/2016   Lab Results  Component Value Date   WBC 9.4 04/07/2016   HGB 10.7 (L) 04/07/2016   HCT 33.3 (L) 04/07/2016   MCV 94.4 04/07/2016   PLT 159.0 04/07/2016   Lab Results  Component Value Date   NA 142 08/04/2016   K 4.4 08/04/2016   CO2 26 08/04/2016   GLUCOSE 126 (H) 08/04/2016   BUN 50 (H) 08/04/2016   CREATININE 1.69 (H) 08/04/2016   BILITOT 0.5 06/02/2016   ALKPHOS 47 06/02/2016   AST 16 06/02/2016   ALT 10 06/02/2016   PROT 7.3 06/02/2016   ALBUMIN 4.1 06/02/2016   CALCIUM 9.6 08/04/2016   ANIONGAP 7 02/14/2016   GFR 31.34 (L) 06/02/2016   Lab Results  Component Value Date   CHOL 128 04/07/2016   Lab Results  Component Value Date   HDL 51.80 04/07/2016   Lab Results  Component Value Date   LDLCALC 50 04/07/2016   Lab Results  Component Value Date   TRIG 130.0 04/07/2016   Lab Results  Component Value Date   CHOLHDL 2 04/07/2016   Lab Results  Component Value Date   HGBA1C 5.9 04/07/2016         Assessment & Plan:   Problem List Items Addressed This Visit    Essential hypertension   Relevant Orders   Comprehensive metabolic panel   CBC   TSH   Diabetes type 2, controlled ([place]) - Primary    hgba1c acceptable, minimize simple carbs. Increase exercise as tolerated. Continue current meds      Relevant Orders   Hemoglobin A1c   Hyperlipidemia, mixed   Relevant Orders   Lipid panel   Tachycardia    RRR today      Hypercalcemia    Pedal edema    She reports it has improved some but weight is stable. Encouraged to minimize sodium, elevate feet, try compression hose. increasde Furosemide to 40 mg po bid x 5 days and then drop back to qd, increase po potassium intake      Chronic diastolic CHF (congestive heart failure) ([facility])    Stage 1 reviewed echo with patient      Chronic kidney disease (CKD), stage III (moderate)    Maintain hydration and avoid OTC meds. monitor         I am having Ms. Mol maintain her glucose blood, acetaminophen, Niacin (VITAMIN B-3 PO), Cyanocobalamin (VITAMIN B-12 PO), OXYGEN, b complex vitamins, Probiotic Product (PROBIOTIC PO), ranitidine, aspirin EC, montelukast, pravastatin, allopurinol, SPIRIVA HANDIHALER, loratadine, fluticasone, famotidine, pantoprazole, metolazone, sertraline, metoprolol succinate, budesonide-formoterol, traMADol, albuterol,  potassium chloride SA, Ferrous Fumarate-Folic Acid, furosemide, doxycycline, and methylPREDNISolone.  No orders of the defined types were placed in this encounter.   CMA served as Education administrator during this visit. History, Physical and Plan performed by medical provider. Documentation and orders reviewed and attested to.  Penni Homans, MD

## 2016-08-18 NOTE — Assessment & Plan Note (Signed)
hgba1c acceptable, minimize simple carbs. Increase exercise as tolerated. Continue current meds 

## 2016-08-18 NOTE — Assessment & Plan Note (Signed)
She reports it has improved some but weight is stable. Encouraged to minimize sodium, elevate feet, try compression hose. increasde Furosemide to 40 mg po bid x 5 days and then drop back to qd, increase po potassium intake

## 2016-08-18 NOTE — Patient Instructions (Addendum)
Fish oil or krill oil caps daily (schiff) Edema Edema is when you have too much fluid in your body or under your skin. Edema may make your legs, feet, and ankles swell up. Swelling is also common in looser tissues, like around your eyes. This is a common condition. It gets more common as you get older. There are many possible causes of edema. Eating too much salt (sodium) and being on your feet or sitting for a long time can cause edema in your legs, feet, and ankles. Hot weather may make edema worse. Edema is usually painless. Your skin may look swollen or shiny. Follow these instructions at home:  Keep the swollen body part raised (elevated) above the level of your heart when you are sitting or lying down.  Do not sit still or stand for a long time.  Do not wear tight clothes. Do not wear garters on your upper legs.  Exercise your legs. This can help the swelling go down.  Wear elastic bandages or support stockings as told by your doctor.  Eat a low-salt (low-sodium) diet to reduce fluid as told by your doctor.  Depending on the cause of your swelling, you may need to limit how much fluid you drink (fluid restriction).  Take over-the-counter and prescription medicines only as told by your doctor. Contact a doctor if:  Treatment is not working.  You have heart, liver, or kidney disease and have symptoms of edema.  You have sudden and unexplained weight gain. Get help right away if:  You have shortness of breath or chest pain.  You cannot breathe when you lie down.  You have pain, redness, or warmth in the swollen areas.  You have heart, liver, or kidney disease and get edema all of a sudden.  You have a fever and your symptoms get worse all of a sudden. Summary  Edema is when you have too much fluid in your body or under your skin.  Edema may make your legs, feet, and ankles swell up. Swelling is also common in looser tissues, like around your eyes.  Raise (elevate) the  swollen body part above the level of your heart when you are sitting or lying down.  Follow your doctor's instructions about diet and how much fluid you can drink (fluid restriction). This information is not intended to replace advice given to you by your health care provider. Make sure you discuss any questions you have with your health care provider. Document Released: 06/25/2007 Document Revised: 01/25/2016 Document Reviewed: 01/25/2016 Elsevier Interactive Patient Education  2017 [employer].

## 2016-08-19 ENCOUNTER — Ambulatory Visit (HOSPITAL_COMMUNITY)
Admission: RE | Admit: 2016-08-19 | Discharge: 2016-08-19 | Disposition: A | Discharge status: Home / Self Care | Payer: Medicare HMO | Source: Ambulatory Visit | Attending: Cardiovascular Disease | Admitting: Cardiovascular Disease

## 2016-08-19 ENCOUNTER — Other Ambulatory Visit: Payer: Self-pay | Admitting: Cardiology

## 2016-08-19 DIAGNOSIS — E1151 Type 2 diabetes mellitus with diabetic peripheral angiopathy without gangrene: Secondary | ICD-10-CM | POA: Insufficient documentation

## 2016-08-19 DIAGNOSIS — J449 Chronic obstructive pulmonary disease, unspecified: Secondary | ICD-10-CM | POA: Diagnosis not present

## 2016-08-19 DIAGNOSIS — I708 Atherosclerosis of other arteries: Secondary | ICD-10-CM | POA: Insufficient documentation

## 2016-08-19 DIAGNOSIS — E785 Hyperlipidemia, unspecified: Secondary | ICD-10-CM | POA: Diagnosis not present

## 2016-08-19 DIAGNOSIS — I10 Essential (primary) hypertension: Secondary | ICD-10-CM | POA: Diagnosis not present

## 2016-08-19 DIAGNOSIS — R938 Abnormal findings on diagnostic imaging of other specified body structures: Secondary | ICD-10-CM | POA: Diagnosis not present

## 2016-08-19 DIAGNOSIS — R600 Localized edema: Secondary | ICD-10-CM | POA: Insufficient documentation

## 2016-08-19 DIAGNOSIS — I739 Peripheral vascular disease, unspecified: Secondary | ICD-10-CM

## 2016-08-19 DIAGNOSIS — I70203 Unspecified atherosclerosis of native arteries of extremities, bilateral legs: Secondary | ICD-10-CM | POA: Diagnosis not present

## 2016-08-19 LAB — LIPID PANEL
Cholesterol: 145 mg/dL (ref 0–200)
HDL: 46.6 mg/dL (ref >39.00)
LDL Cholesterol: 66 mg/dL (ref 0–99)
NonHDL: 98.72
Total CHOL/HDL Ratio: 3
Triglycerides: 163 mg/dL — ABNORMAL HIGH (ref 0.0–149.0)
VLDL: 32.6 mg/dL (ref 0.0–40.0)

## 2016-08-19 LAB — CBC
HCT: 34.3 % — ABNORMAL LOW (ref 36.0–46.0)
Hemoglobin: 11 g/dL — ABNORMAL LOW (ref 12.0–15.0)
MCHC: 32.2 g/dL (ref 30.0–36.0)
MCV: 97.1 fl (ref 78.0–100.0)
Platelets: 141 K/uL — ABNORMAL LOW (ref 150.0–400.0)
RBC: 3.53 Mil/uL — ABNORMAL LOW (ref 3.87–5.11)
RDW: 15.7 % — ABNORMAL HIGH (ref 11.5–15.5)
WBC: 8.2 K/uL (ref 4.0–10.5)

## 2016-08-19 LAB — COMPREHENSIVE METABOLIC PANEL WITH GFR
ALT: 9 U/L (ref 0–35)
AST: 14 U/L (ref 0–37)
Albumin: 4.1 g/dL (ref 3.5–5.2)
Alkaline Phosphatase: 50 U/L (ref 39–117)
BUN: 40 mg/dL — ABNORMAL HIGH (ref 6–23)
CO2: 33 meq/L — ABNORMAL HIGH (ref 19–32)
Calcium: 10.7 mg/dL — ABNORMAL HIGH (ref 8.4–10.5)
Chloride: 100 meq/L (ref 96–112)
Creatinine, Ser: 1.74 mg/dL — ABNORMAL HIGH (ref 0.40–1.20)
GFR: 30.29 mL/min — ABNORMAL LOW (ref >60.00)
Glucose, Bld: 92 mg/dL (ref 70–99)
Potassium: 4.7 meq/L (ref 3.5–5.1)
Sodium: 141 meq/L (ref 135–145)
Total Bilirubin: 0.4 mg/dL (ref 0.2–1.2)
Total Protein: 7.2 g/dL (ref 6.0–8.3)

## 2016-08-19 LAB — HEMOGLOBIN A1C: Hgb A1c MFr Bld: 6.1 % (ref 4.6–6.5)

## 2016-08-19 LAB — TSH: TSH: 4.73 u[IU]/mL — AB (ref 0.35–4.50)

## 2016-08-20 ENCOUNTER — Other Ambulatory Visit (INDEPENDENT_AMBULATORY_CARE_PROVIDER_SITE_OTHER): Payer: Medicare HMO

## 2016-08-20 DIAGNOSIS — J4520 Mild intermittent asthma, uncomplicated: Secondary | ICD-10-CM | POA: Diagnosis not present

## 2016-08-20 DIAGNOSIS — I10 Essential (primary) hypertension: Secondary | ICD-10-CM

## 2016-08-20 DIAGNOSIS — R0902 Hypoxemia: Secondary | ICD-10-CM | POA: Diagnosis not present

## 2016-08-20 LAB — T4, FREE: Free T4: 1.04 ng/dL (ref 0.60–1.60)

## 2016-08-22 ENCOUNTER — Telehealth: Payer: Self-pay | Admitting: Cardiology

## 2016-08-22 NOTE — Telephone Encounter (Signed)
Spoke with patient who reports she doesn't remember what she was told about her results.  Reviewed the information below with the patient who states understanding.  She is aware of the appt with Dr Gwenlyn Found 9/12 at 9:30 am.  No further questions.  Notes recorded by Tamsen Snider on 08/22/2016 at 8:44 AM EDT S/w pt is aware of results. Scheduled appt with Dr.Berry. ------  Notes recorded by Sueanne Margarita, MD on 08/22/2016 at 7:02 AM EDT Please let patient know that she has evidence of moderate bilateral PAD. Please refer to Dr. Gwenlyn Found

## 2016-08-22 NOTE — Telephone Encounter (Signed)
Pt calling stating she was given results of ABI and lower extremity-can't remember what she was told that she had, some "initials" as a diagnosis. Pls call with results

## 2016-09-01 ENCOUNTER — Other Ambulatory Visit: Payer: Medicare HMO

## 2016-09-02 ENCOUNTER — Other Ambulatory Visit: Payer: Self-pay | Admitting: Family Medicine

## 2016-09-10 ENCOUNTER — Other Ambulatory Visit: Payer: Self-pay | Admitting: Family Medicine

## 2016-09-11 ENCOUNTER — Other Ambulatory Visit: Payer: Self-pay

## 2016-09-11 NOTE — Telephone Encounter (Signed)
Requesting: Tramadol Contract: Yes UDS:  3.2.18 low risk next screen 9.2.18 Last OV:  7.30.18 Next OV:  8.30.18 Last Refill:  7.3.18   Please advise

## 2016-09-11 NOTE — Telephone Encounter (Signed)
OK to refill Tramadol 

## 2016-09-12 MED ORDER — TRAMADOL HCL 50 MG PO TABS: 50.0000 mg | ORAL_TABLET | Freq: Three times a day (TID) | ORAL | 0 refills | Status: DC | PRN

## 2016-09-12 NOTE — Telephone Encounter (Signed)
Printed Rx per SB/will fax to pharm/thx dmf

## 2016-09-18 ENCOUNTER — Encounter: Payer: Self-pay | Admitting: Family Medicine

## 2016-09-18 ENCOUNTER — Ambulatory Visit (INDEPENDENT_AMBULATORY_CARE_PROVIDER_SITE_OTHER): Payer: Medicare HMO | Admitting: Family Medicine

## 2016-09-18 VITALS — BP 104/66 | HR 60 | Temp 98.6°F | Wt 204.0 lb

## 2016-09-18 DIAGNOSIS — Z23 Encounter for immunization: Secondary | ICD-10-CM | POA: Diagnosis not present

## 2016-09-18 DIAGNOSIS — I10 Essential (primary) hypertension: Secondary | ICD-10-CM | POA: Diagnosis not present

## 2016-09-18 DIAGNOSIS — N289 Disorder of kidney and ureter, unspecified: Secondary | ICD-10-CM | POA: Diagnosis not present

## 2016-09-18 DIAGNOSIS — R946 Abnormal results of thyroid function studies: Secondary | ICD-10-CM

## 2016-09-18 DIAGNOSIS — E118 Type 2 diabetes mellitus with unspecified complications: Secondary | ICD-10-CM

## 2016-09-18 DIAGNOSIS — E782 Mixed hyperlipidemia: Secondary | ICD-10-CM

## 2016-09-18 DIAGNOSIS — K5900 Constipation, unspecified: Secondary | ICD-10-CM

## 2016-09-18 DIAGNOSIS — R7989 Other specified abnormal findings of blood chemistry: Secondary | ICD-10-CM

## 2016-09-18 NOTE — Patient Instructions (Signed)
Encouraged increased hydration and fiber in diet. Daily probiotics. If bowels not moving can use MOM 2 tbls po in 4 oz of warm prune juice by mouth every 2-3 days. If no results then repeat in 4 hours with  Dulcolax suppository pr, may repeat again in 4 more hours as needed. Seek care if symptoms worsen. Consider daily Miralax and/or Dulcolax if symptoms persist.   If still struggling with constipation then mix Miralax and Benefiber together and take in liquid once or Constipation, Adult Constipation is when a person:  Poops (has a bowel movement) fewer times in a week than normal.  Has a hard time pooping.  Has poop that is dry, hard, or bigger than normal.  Follow these instructions at home: Eating and drinking   Eat foods that have a lot of fiber, such as: ? Fresh fruits and vegetables. ? Whole grains. ? Beans.  Eat less of foods that are high in fat, low in fiber, or overly processed, such as: ? [place] fries. ? Hamburgers. ? Cookies. ? Candy. ? Soda.  Drink enough fluid to keep your pee (urine) clear or pale yellow. General instructions  Exercise regularly or as told by your doctor.  Go to the restroom when you feel like you need to poop. Do not hold it in.  Take over-the-counter and prescription medicines only as told by your doctor. These include any fiber supplements.  Do pelvic floor retraining exercises, such as: ? Doing deep breathing while relaxing your lower belly (abdomen). ? Relaxing your pelvic floor while pooping.  Watch your condition for any changes.  Keep all follow-up visits as told by your doctor. This is important. Contact a doctor if:  You have pain that gets worse.  You have a fever.  You have not pooped for 4 days.  You throw up (vomit).  You are not hungry.  You lose weight.  You are bleeding from the anus.  You have thin, pencil-like poop (stool). Get help right away if:  You have a fever, and your symptoms suddenly get  worse.  You leak poop or have blood in your poop.  Your belly feels hard or bigger than normal (is bloated).  You have very bad belly pain.  You feel dizzy or you faint. This information is not intended to replace advice given to you by your health care provider. Make sure you discuss any questions you have with your health care provider. Document Released: 06/25/2007 Document Revised: 07/27/2015 Document Reviewed: 06/27/2015 Elsevier Interactive Patient Education  2017 [employer].  twice daily

## 2016-09-19 LAB — CBC WITH DIFFERENTIAL/PLATELET
BASOS ABS: 0.1 10*3/uL (ref 0.0–0.1)
BASOS PCT: 0.8 % (ref 0.0–3.0)
EOS PCT: 4.8 % (ref 0.0–5.0)
Eosinophils Absolute: 0.3 10*3/uL (ref 0.0–0.7)
HEMATOCRIT: 34.7 % — AB (ref 36.0–46.0)
Hemoglobin: 11.1 g/dL — ABNORMAL LOW (ref 12.0–15.0)
LYMPHS PCT: 19.2 % (ref 12.0–46.0)
Lymphs Abs: 1.3 10*3/uL (ref 0.7–4.0)
MCHC: 32.1 g/dL (ref 30.0–36.0)
MCV: 97.7 fl (ref 78.0–100.0)
Monocytes Absolute: 1 10*3/uL (ref 0.1–1.0)
Monocytes Relative: 14.2 % — ABNORMAL HIGH (ref 3.0–12.0)
NEUTROS ABS: 4.2 10*3/uL (ref 1.4–7.7)
Neutrophils Relative %: 61 % (ref 43.0–77.0)
PLATELETS: 162 10*3/uL (ref 150.0–400.0)
RBC: 3.55 Mil/uL — ABNORMAL LOW (ref 3.87–5.11)
RDW: 15.8 % — AB (ref 11.5–15.5)
WBC: 6.9 10*3/uL (ref 4.0–10.5)

## 2016-09-19 LAB — COMPREHENSIVE METABOLIC PANEL
ALT: 10 U/L (ref 0–35)
AST: 15 U/L (ref 0–37)
Albumin: 4.1 g/dL (ref 3.5–5.2)
Alkaline Phosphatase: 47 U/L (ref 39–117)
BILIRUBIN TOTAL: 0.5 mg/dL (ref 0.2–1.2)
BUN: 40 mg/dL — ABNORMAL HIGH (ref 6–23)
CALCIUM: 9.7 mg/dL (ref 8.4–10.5)
CO2: 33 meq/L — AB (ref 19–32)
Chloride: 97 mEq/L (ref 96–112)
Creatinine, Ser: 2.11 mg/dL — ABNORMAL HIGH (ref 0.40–1.20)
GFR: 24.24 mL/min — AB (ref >60.00)
Glucose, Bld: 82 mg/dL (ref 70–99)
POTASSIUM: 4.4 meq/L (ref 3.5–5.1)
Sodium: 138 mEq/L (ref 135–145)
Total Protein: 6.8 g/dL (ref 6.0–8.3)

## 2016-09-20 DIAGNOSIS — R0902 Hypoxemia: Secondary | ICD-10-CM | POA: Diagnosis not present

## 2016-09-20 DIAGNOSIS — J4520 Mild intermittent asthma, uncomplicated: Secondary | ICD-10-CM | POA: Diagnosis not present

## 2016-09-22 ENCOUNTER — Encounter: Payer: Self-pay | Admitting: Family Medicine

## 2016-09-22 DIAGNOSIS — R7989 Other specified abnormal findings of blood chemistry: Secondary | ICD-10-CM

## 2016-09-22 DIAGNOSIS — N289 Disorder of kidney and ureter, unspecified: Secondary | ICD-10-CM

## 2016-09-22 HISTORY — DX: Disorder of kidney and ureter, unspecified: N28.9

## 2016-09-22 HISTORY — DX: Other specified abnormal findings of blood chemistry: R79.89

## 2016-09-22 NOTE — Progress Notes (Signed)
Subjective:    Patient ID: [name], female    DOB: Oct 23, 1940, 76 y.o.   MRN: [id number]  Chief Complaint  Patient presents with  . Diabetes    follow up     HPI Patient is in today for follow up . She feels fatigued but well today. Her blood sugars fasting have been good lately with a low of 89 and the highest in the teens. No polyuria or polydipsia. Her breathing continues to be a struggle but no exacerbations noted. Denies CP/palp/HA/congestion/fevers/GI or GU c/o. Taking meds as prescribed  Past Medical History:  Diagnosis Date  . Abnormal glucose tolerance test   . Abnormal TSH 09/22/2016  . ACE-inhibitor cough   . Anemia 03/12/2014  . Arthritis   . Asthma    PFT 02/06/09 FEV1 1.41 (65%), FVC 1.92 (64), FEV1% 74, TLC 3.47 (71%), DLCO 48%, +BD  . Atypical chest pain    s/p cath, Normal coronaries, Non ST elevation myocardial infarction, Rt groin pseudoaneurysm  . Bacterial vaginosis 03/12/2014  . Cellulitis 06/22/2016  . Chronic kidney disease (CKD), stage III (moderate) 08/04/2016  . COPD (chronic obstructive pulmonary disease) ([place])   . Depression   . Diastolic heart failure ([place]) 04/07/2016  . DVT (deep venous thrombosis) ([place]) 1987  . Fall at home 12/04/2014  . GERD (gastroesophageal reflux disease)   . Gout   . Hypercalcemia 10/15/2014  . Hyperlipidemia   . Hypertension   . Hypoxia 10/15/2014  . Macular degeneration 04/10/2015  . Osteopenia 12/29/2006   Qualifier: Diagnosis of  By: Wynona Luna   . Pneumonia   . Polymyalgia rheumatica ([place]) 01/07/2016  . Renal insufficiency 09/22/2016  . Sun-damaged skin 10/24/2012  . Unspecified constipation 06/05/2013  . Urinary frequency 10/24/2012  . UTI (lower urinary tract infection) 10/24/2012  . Vitamin D deficiency 01/01/2015    Past Surgical History:  Procedure Laterality Date  . APPENDECTOMY  1951  . TONSILLECTOMY  1950  . TUBAL LIGATION  1968    Family History  Problem Relation Age of Onset  . Asthma Sister    . Arthritis Sister   . Hypertension Sister   . Hyperlipidemia Sister   . Uterine cancer Sister   . Coronary artery disease Brother        x2  . Arthritis Brother   . Lung cancer Brother        smoker  . Hypertension Sister   . Arthritis Sister   . Hyperlipidemia Sister   . Emphysema Sister   . COPD Sister        smoker  . Heart disease Sister   . Hyperlipidemia Sister   . Hypertension Sister   . Arthritis Sister   . Emphysema Brother   . Heart disease Brother         smoker  . Heart attack Brother   . Mental illness Father   . Alcohol abuse Father   . Suicidality Father   . Heart disease Mother   . Hyperlipidemia Mother   . Heart attack Mother   . Epilepsy Daughter   . Hypertension Daughter   . Obesity Daughter   . COPD Brother        smoker  . Lung cancer Brother   . Coronary artery disease Other   . Colon polyps Sister     Social History   Social History  . Marital status: Widowed    Spouse name: N/A  . Number of children: 1  .  Years of education: N/A   Occupational History  . Retired [employer]   Social History Main Topics  . Smoking status: Never Smoker  . Smokeless tobacco: Never Used  . Alcohol use No  . Drug use: No  . Sexual activity: No     Comment: lives alone, no dietary restrictions   Other Topics Concern  . Not on file   Social History Narrative  . No narrative on file    Outpatient Medications Prior to Visit  Medication Sig Dispense Refill  . acetaminophen (TYLENOL) 500 MG tablet Take 500 mg by mouth every 6 (six) hours as needed.    Marland Kitchen albuterol (VENTOLIN HFA) 108 (90 Base) MCG/ACT inhaler Inhale 2 puffs into the lungs 4 (four) times daily as needed for wheezing or shortness of breath. 3 Inhaler 0  . allopurinol (ZYLOPRIM) 100 MG tablet TAKE 2 TABLETS ONE TIME DAILY 180 tablet 1  . aspirin EC 81 MG tablet Take 1 tablet (81 mg total) by mouth daily.    Marland Kitchen b complex vitamins tablet Take 1 tablet by mouth daily.    .  budesonide-formoterol (SYMBICORT) 160-4.5 MCG/ACT inhaler INHALE 2 PUFFS INTO THE LUNGS 2 TIMES DAILY 3 Inhaler 1  . Cyanocobalamin (VITAMIN B-12 PO) Take by mouth daily.    Marland Kitchen doxycycline (VIBRA-TABS) 100 MG tablet Take 1 tablet (100 mg total) by mouth 2 (two) times daily. 20 tablet 0  . famotidine (PEPCID) 20 MG tablet Take 1 tablet (20 mg total) by mouth at bedtime. 30 tablet 0  . Ferrous Fumarate-Folic Acid (HEMOCYTE-F) 324-1 MG TABS Take 1 tablet by mouth daily. 90 each 1  . fluticasone (FLONASE) 50 MCG/ACT nasal spray Place 1 spray into both nostrils daily. 16 g 3  . furosemide (LASIX) 40 MG tablet 1 tab po daily and a 2nd tab po daily prn increased edema, wt gain>3 #/24 hour 100 tablet 5  . glucose blood (ONE TOUCH TEST STRIPS) test strip Use as instructed to check blood sugar once weekly. DX 790.29     . loratadine (CLARITIN) 10 MG tablet Take 1 tablet (10 mg total) by mouth at bedtime. 30 tablet 5  . metolazone (ZAROXOLYN) 2.5 MG tablet 1 tab po daily 1/2 hour prior to Lasix prn pedal edema, increasing SOB, weight gain >3# 20 tablet 1  . metoprolol succinate (TOPROL-XL) 50 MG 24 hr tablet Take 75 mg (1.5 tablets) daily. 135 tablet 3  . montelukast (SINGULAIR) 10 MG tablet Take 1 tablet (10 mg total) by mouth at bedtime as needed. 30 tablet 3  . Niacin (VITAMIN B-3 PO) Take 1 tablet by mouth daily.    . OXYGEN Inhale into the lungs.    . pantoprazole (PROTONIX) 40 MG tablet TAKE 1 TABLET TWICE DAILY 180 tablet 1  . potassium chloride SA (K-DUR,KLOR-CON) 20 MEQ tablet Take 1 tablet (20 mEq total) by mouth daily. And a 2nd tab po daily prn extra Furosemide tab 100 tablet 5  . pravastatin (PRAVACHOL) 20 MG tablet TAKE 1 TABLET EVERY DAY 90 tablet 1  . Probiotic Product (PROBIOTIC PO) Take by mouth daily.    . ranitidine (ZANTAC) 300 MG tablet TAKE 1 TABLET AT BEDTIME 90 tablet 3  . sertraline (ZOLOFT) 100 MG tablet TAKE 1 TABLET (100 MG TOTAL) BY MOUTH DAILY. 90 tablet 3  . SPIRIVA HANDIHALER  18 MCG inhalation capsule PLACE 1 CAPSULE (18 MCG TOTAL) INTO INHALER AND INHALE CONTENTS ONE TIME DAILY. 90 capsule 1  . traMADol (ULTRAM) 50 MG tablet  Take 1 tablet (50 mg total) by mouth 3 (three) times daily as needed. 90 tablet 0  . methylPREDNISolone (MEDROL) 4 MG tablet 5 tab po qd X 1d then 4 tab po qd X 1d then 3 tab po qd X 1d then 2 tab po qd then 1 tab po qd 15 tablet 0   No facility-administered medications prior to visit.     Allergies  Allergen Reactions  . Oseltamivir Phosphate     REACTION: nausea, vomiting, diarrhea, dizziness  . Sulfa Antibiotics Other (See Comments)    CP  . Ace Inhibitors   . Indomethacin   . Montelukast Sodium     REACTION: Heart palpitations, chest pain  . Sulfonamide Derivatives     Review of Systems  Constitutional: Positive for malaise/fatigue. Negative for fever.  HENT: Negative for congestion.   Eyes: Negative for blurred vision.  Respiratory: Positive for shortness of breath.   Cardiovascular: Negative for chest pain, palpitations and leg swelling.  Gastrointestinal: Negative for abdominal pain, blood in stool and nausea.  Genitourinary: Negative for dysuria and frequency.  Musculoskeletal: Negative for falls.  Skin: Negative for rash.  Neurological: Negative for dizziness, loss of consciousness and headaches.  Endo/Heme/Allergies: Negative for environmental allergies.  Psychiatric/Behavioral: Negative for depression. The patient is not nervous/anxious.        Objective:    Physical Exam  Constitutional: She is oriented to person, place, and time. She appears well-developed and well-nourished. No distress.  HENT:  Head: Normocephalic and atraumatic.  Nose: Nose normal.  Eyes: Right eye exhibits no discharge. Left eye exhibits no discharge.  Neck: Normal range of motion. Neck supple.  Cardiovascular: Normal rate and regular rhythm.   Pulmonary/Chest: Effort normal and breath sounds normal.  Abdominal: Soft. Bowel sounds are  normal. There is no tenderness.  Musculoskeletal: She exhibits no edema.  Neurological: She is alert and oriented to person, place, and time.  Skin: Skin is warm and dry.  Psychiatric: She has a normal mood and affect.  Nursing note and vitals reviewed.   BP 104/66 (BP Location: Right Arm, Patient Position: Sitting, Cuff Size: Normal)   Pulse 60   Temp 98.6 F (37 C) (Oral)   Wt 204 lb (92.5 kg)   SpO2 97%   BMI 33.95 kg/m  Wt Readings from Last 3 Encounters:  09/18/16 204 lb (92.5 kg)  08/18/16 209 lb 9.6 oz (95.1 kg)  08/04/16 209 lb (94.8 kg)     Lab Results  Component Value Date   WBC 6.9 09/18/2016   HGB 11.1 (L) 09/18/2016   HCT 34.7 (L) 09/18/2016   PLT 162.0 09/18/2016   GLUCOSE 82 09/18/2016   CHOL 145 08/18/2016   TRIG 163.0 (H) 08/18/2016   HDL 46.60 08/18/2016   LDLDIRECT 142.7 12/16/2005   LDLCALC 66 08/18/2016   ALT 10 09/18/2016   AST 15 09/18/2016   NA 138 09/18/2016   K 4.4 09/18/2016   CL 97 09/18/2016   CREATININE 2.11 (H) 09/18/2016   BUN 40 (H) 09/18/2016   CO2 33 (H) 09/18/2016   TSH 4.73 (H) 08/18/2016   INR 1.0 09/12/2006   HGBA1C 6.1 08/18/2016   MICROALBUR 0.8 02/28/2014    Lab Results  Component Value Date   TSH 4.73 (H) 08/18/2016   Lab Results  Component Value Date   WBC 6.9 09/18/2016   HGB 11.1 (L) 09/18/2016   HCT 34.7 (L) 09/18/2016   MCV 97.7 09/18/2016   PLT 162.0 09/18/2016   Lab  Results  Component Value Date   NA 138 09/18/2016   K 4.4 09/18/2016   CO2 33 (H) 09/18/2016   GLUCOSE 82 09/18/2016   BUN 40 (H) 09/18/2016   CREATININE 2.11 (H) 09/18/2016   BILITOT 0.5 09/18/2016   ALKPHOS 47 09/18/2016   AST 15 09/18/2016   ALT 10 09/18/2016   PROT 6.8 09/18/2016   ALBUMIN 4.1 09/18/2016   CALCIUM 9.7 09/18/2016   ANIONGAP 7 02/14/2016   GFR 24.24 (L) 09/18/2016   Lab Results  Component Value Date   CHOL 145 08/18/2016   Lab Results  Component Value Date   HDL 46.60 08/18/2016   Lab Results    Component Value Date   LDLCALC 66 08/18/2016   Lab Results  Component Value Date   TRIG 163.0 (H) 08/18/2016   Lab Results  Component Value Date   CHOLHDL 3 08/18/2016   Lab Results  Component Value Date   HGBA1C 6.1 08/18/2016       Assessment & Plan:   Problem List Items Addressed This Visit    Essential hypertension    Well controlled, no changes to meds. Encouraged heart healthy diet such as the DASH diet and exercise as tolerated.       Diabetes type 2, controlled ([place])    hgba1c acceptable, minimize simple carbs. Increase exercise as tolerated. Given hi dose flu shot. Recent fasting sugars hovering between 90 and 115      Hyperlipidemia, mixed    Encouraged heart healthy diet, increase exercise, avoid trans fats, consider a krill oil cap daily      Constipation    Encouraged increased hydration and fiber in diet. Daily probiotics. If bowels not moving can use MOM 2 tbls po in 4 oz of warm prune juice by mouth every 2-3 days. If no results then repeat in 4 hours with  Dulcolax suppository pr, may repeat again in 4 more hours as needed. Seek care if symptoms worsen. Consider daily Miralax and/or Dulcolax if symptoms persist. miralax and benefiber daily      Renal insufficiency    Gradually worsening. Reminded to maintain hydration and avoid OTC meds. Encouraged referral to nephrology for surveillance will continue to monitor      Abnormal TSH    Mildly elevated but free T4 normal will monitor       Other Visit Diagnoses    Hypertension, unspecified type    -  Primary   Relevant Orders   CBC with Differential/Platelet (Completed)   Comprehensive metabolic panel (Completed)   Need for influenza vaccination       Relevant Orders   Flu Vaccine QUAD 36+ mos IM (Completed)      I have discontinued [name]'s methylPREDNISolone. I am also having her maintain her glucose blood, acetaminophen, Niacin (VITAMIN B-3 PO), Cyanocobalamin (VITAMIN B-12 PO), OXYGEN, b  complex vitamins, Probiotic Product (PROBIOTIC PO), ranitidine, aspirin EC, montelukast, pravastatin, SPIRIVA HANDIHALER, loratadine, fluticasone, famotidine, pantoprazole, metolazone, sertraline, metoprolol succinate, budesonide-formoterol, albuterol, potassium chloride SA, Ferrous Fumarate-Folic Acid, furosemide, doxycycline, allopurinol, and traMADol.  No orders of the defined types were placed in this encounter.    Penni Homans, MD

## 2016-09-22 NOTE — Assessment & Plan Note (Signed)
Encouraged heart healthy diet, increase exercise, avoid trans fats, consider a krill oil cap daily 

## 2016-09-22 NOTE — Assessment & Plan Note (Signed)
Well controlled, no changes to meds. Encouraged heart healthy diet such as the DASH diet and exercise as tolerated.  °

## 2016-09-22 NOTE — Assessment & Plan Note (Addendum)
hgba1c acceptable, minimize simple carbs. Increase exercise as tolerated. Given hi dose flu shot. Recent fasting sugars hovering between 90 and 115

## 2016-09-22 NOTE — Assessment & Plan Note (Signed)
Mildly elevated but free T4 normal will monitor

## 2016-09-22 NOTE — Assessment & Plan Note (Signed)
Gradually worsening. Reminded to maintain hydration and avoid OTC meds. Encouraged referral to nephrology for surveillance will continue to monitor

## 2016-09-22 NOTE — Assessment & Plan Note (Signed)
Encouraged increased hydration and fiber in diet. Daily probiotics. If bowels not moving can use MOM 2 tbls po in 4 oz of warm prune juice by mouth every 2-3 days. If no results then repeat in 4 hours with  Dulcolax suppository pr, may repeat again in 4 more hours as needed. Seek care if symptoms worsen. Consider daily Miralax and/or Dulcolax if symptoms persist. miralax and benefiber daily

## 2016-09-23 ENCOUNTER — Other Ambulatory Visit: Payer: Self-pay | Admitting: Family Medicine

## 2016-09-23 ENCOUNTER — Telehealth: Payer: Self-pay | Admitting: Family Medicine

## 2016-09-23 DIAGNOSIS — N289 Disorder of kidney and ureter, unspecified: Secondary | ICD-10-CM

## 2016-09-23 NOTE — Telephone Encounter (Signed)
Caller name: Relation to QP:YPPJ Call back number:[phone] Pharmacy:  Reason for call: pt states she received a call on Friday regarding lab results, please return the call with results.

## 2016-09-23 NOTE — Progress Notes (Signed)
Patient aware referral for Nephrology to evaluate kidney function and in agreement/thx dmf

## 2016-09-23 NOTE — Telephone Encounter (Signed)
Patient notified of results via Selinda Eon.    PC

## 2016-09-29 ENCOUNTER — Other Ambulatory Visit: Payer: Self-pay | Admitting: Family Medicine

## 2016-09-29 DIAGNOSIS — E785 Hyperlipidemia, unspecified: Secondary | ICD-10-CM

## 2016-10-01 ENCOUNTER — Ambulatory Visit (INDEPENDENT_AMBULATORY_CARE_PROVIDER_SITE_OTHER): Payer: Medicare HMO | Admitting: Cardiovascular Disease

## 2016-10-01 ENCOUNTER — Encounter: Payer: Self-pay | Admitting: Cardiovascular Disease

## 2016-10-01 VITALS — BP 136/74 | HR 74 | Ht 65.0 in | Wt 207.8 lb

## 2016-10-01 DIAGNOSIS — I739 Peripheral vascular disease, unspecified: Secondary | ICD-10-CM

## 2016-10-01 DIAGNOSIS — I10 Essential (primary) hypertension: Secondary | ICD-10-CM | POA: Diagnosis not present

## 2016-10-01 NOTE — Patient Instructions (Signed)
Medication Instructions: Your physician recommends that you continue on your current medications as directed. Please refer to the Current Medication list given to you today.   Follow-Up: Your physician recommends that you schedule a follow-up appointment as needed with Dr. Berry.    

## 2016-10-01 NOTE — Assessment & Plan Note (Signed)
[name] was referred to me by Dr. Radford Pax for evaluation of peripheral arterial disease. She does have a history of hypertension, hyperlipidemia and diastolic heart failure on diuretics. She has chronic lower extremity edema. She complains of discomfort in her legs which she walks but also at rest. She had Dopplers performed 08/19/16 that showed ABIs of greater than one bilaterally without significant high-grade obstructive disease. She does have palpable pedal pulses. I do not think her pain is related to vascular insufficiency.

## 2016-10-01 NOTE — Progress Notes (Signed)
10/01/2016 [name]   1940-09-01  [id number]  Primary Physician Margaret Lukes, MD Primary Cardiologist: Margaret Harp MD [name], [place]  HPI:  [name] is a 76 y.o. female widowed, mother of one, grandmother and 2 grandchildren referred by Dr. Radford Byrd for peripheral vascular evaluation because of claudication. She does have a history of 2 hypertension, hyperlipidemia and diastolic heart failure. She has chronic lower extremity edema with venous stasis changes. She is complaining of pain in both legs both with ambulation and at rest. Lower extremity Dopplers performed 08/19/16 revealed ABIs of 1.2 bilaterally without significant high-grade obstructive disease which would explain her symptoms. In addition, she does have palpable pedal pulses.   Current Meds  Medication Sig  . acetaminophen (TYLENOL) 500 MG tablet Take 500 mg by mouth every 6 (six) hours as needed.  Marland Kitchen albuterol (VENTOLIN HFA) 108 (90 Base) MCG/ACT inhaler Inhale 2 puffs into the lungs 4 (four) times daily as needed for wheezing or shortness of breath.  . allopurinol (ZYLOPRIM) 100 MG tablet TAKE 2 TABLETS ONE TIME DAILY  . aspirin EC 81 MG tablet Take 1 tablet (81 mg total) by mouth daily.  Marland Kitchen b complex vitamins tablet Take 1 tablet by mouth daily.  . budesonide-formoterol (SYMBICORT) 160-4.5 MCG/ACT inhaler INHALE 2 PUFFS INTO THE LUNGS 2 TIMES DAILY  . Cyanocobalamin (VITAMIN B-12 PO) Take by mouth daily.  Marland Kitchen doxycycline (VIBRA-TABS) 100 MG tablet Take 1 tablet (100 mg total) by mouth 2 (two) times daily.  . famotidine (PEPCID) 20 MG tablet Take 1 tablet (20 mg total) by mouth at bedtime.  . Ferrous Fumarate-Folic Acid (HEMOCYTE-F) 324-1 MG TABS Take 1 tablet by mouth daily.  . fluticasone (FLONASE) 50 MCG/ACT nasal spray Place 1 spray into both nostrils daily.  . furosemide (LASIX) 40 MG tablet 1 tab po daily and a 2nd tab po daily prn increased edema, wt gain>3 #/24 hour  . glucose blood (ONE  TOUCH TEST STRIPS) test strip Use as instructed to check blood sugar once weekly. DX 790.29   . loratadine (CLARITIN) 10 MG tablet Take 1 tablet (10 mg total) by mouth at bedtime.  . metolazone (ZAROXOLYN) 2.5 MG tablet 1 tab po daily 1/2 hour prior to Lasix prn pedal edema, increasing SOB, weight gain >3#  . metoprolol succinate (TOPROL-XL) 50 MG 24 hr tablet Take 75 mg (1.5 tablets) daily.  . montelukast (SINGULAIR) 10 MG tablet Take 1 tablet (10 mg total) by mouth at bedtime as needed.  . Niacin (VITAMIN B-3 PO) Take 1 tablet by mouth daily.  . OXYGEN Inhale into the lungs.  . pantoprazole (PROTONIX) 40 MG tablet TAKE 1 TABLET TWICE DAILY  . potassium chloride SA (K-DUR,KLOR-CON) 20 MEQ tablet Take 1 tablet (20 mEq total) by mouth daily. And a 2nd tab po daily prn extra Furosemide tab  . pravastatin (PRAVACHOL) 20 MG tablet TAKE 1 TABLET EVERY DAY  . Probiotic Product (PROBIOTIC PO) Take by mouth daily.  . ranitidine (ZANTAC) 300 MG tablet TAKE 1 TABLET AT BEDTIME  . sertraline (ZOLOFT) 100 MG tablet TAKE 1 TABLET (100 MG TOTAL) BY MOUTH DAILY.  Marland Kitchen SPIRIVA HANDIHALER 18 MCG inhalation capsule PLACE 1 CAPSULE (18 MCG TOTAL) INTO INHALER AND INHALE CONTENTS ONE TIME DAILY.  . traMADol (ULTRAM) 50 MG tablet Take 1 tablet (50 mg total) by mouth 3 (three) times daily as needed.     Allergies  Allergen Reactions  . Oseltamivir Phosphate  REACTION: nausea, vomiting, diarrhea, dizziness  . Sulfa Antibiotics Other (See Comments)    CP  . Ace Inhibitors   . Indomethacin   . Montelukast Sodium     REACTION: Heart palpitations, chest pain  . Sulfonamide Derivatives     Social History   Social History  . Marital status: Widowed    Spouse name: N/A  . Number of children: 1  . Years of education: N/A   Occupational History  . Retired [employer]   Social History Main Topics  . Smoking status: Never Smoker  . Smokeless tobacco: Never Used  . Alcohol use No  . Drug use: No  . Sexual  activity: No     Comment: lives alone, no dietary restrictions   Other Topics Concern  . Not on file   Social History Narrative  . No narrative on file     Review of Systems: General: negative for chills, fever, night sweats or weight changes.  Cardiovascular: negative for chest pain, dyspnea on exertion, edema, orthopnea, palpitations, paroxysmal nocturnal dyspnea or shortness of breath Dermatological: negative for rash Respiratory: negative for cough or wheezing Urologic: negative for hematuria Abdominal: negative for nausea, vomiting, diarrhea, bright red blood per rectum, melena, or hematemesis Neurologic: negative for visual changes, syncope, or dizziness All other systems reviewed and are otherwise negative except as noted above.    Blood pressure 136/74, pulse 74, height 5\' 5"  (1.651 m), weight 207 lb 12.8 oz (94.3 kg).  General appearance: alert and no distress Neck: no adenopathy, no carotid bruit, no JVD, supple, symmetrical, trachea midline and thyroid not enlarged, symmetric, no tenderness/mass/nodules Lungs: clear to auscultation bilaterally Heart: regular rate and rhythm, S1, S2 normal, no murmur, click, rub or gallop Extremities: 2-3+ pitting edema bilaterally with venous stasis changes and palpable pedal pulses.  EKG sinus rhythm at 74 with occasional PVCs and poor R-wave progression. I personally reviewed this EKG.  ASSESSMENT AND PLAN:   Peripheral arterial disease ([name]) [name] was referred to me by Dr. Radford Byrd for evaluation of peripheral arterial disease. She does have a history of hypertension, hyperlipidemia and diastolic heart failure on diuretics. She has chronic lower extremity edema. She complains of discomfort in her legs which she walks but also at rest. She had Dopplers performed 08/19/16 that showed ABIs of greater than one bilaterally without significant high-grade obstructive disease. She does have palpable pedal pulses. I do not think her pain is  related to vascular insufficiency.      Margaret Harp MD FACP,FACC,FAHA, [facility] 10/01/2016 10:05 AM

## 2016-10-06 ENCOUNTER — Encounter: Payer: Self-pay | Admitting: Adult Health

## 2016-10-06 ENCOUNTER — Ambulatory Visit (INDEPENDENT_AMBULATORY_CARE_PROVIDER_SITE_OTHER): Payer: Medicare HMO | Admitting: Adult Health

## 2016-10-06 DIAGNOSIS — J4540 Moderate persistent asthma, uncomplicated: Secondary | ICD-10-CM

## 2016-10-06 DIAGNOSIS — J9611 Chronic respiratory failure with hypoxia: Secondary | ICD-10-CM | POA: Diagnosis not present

## 2016-10-06 NOTE — Progress Notes (Signed)
@Patient  ID: [name], female    DOB: [birth year], 76 y.o.   MRN: [id number]  Chief Complaint  Patient presents with  . Follow-up    asthma     Referring provider: Mosie Lukes, MD  HPI: 76 year old female never smoker followed for chronic asthma and bronchitis. She is oxygen dependent with activity and at bedtime  TEST /Events  PFTs 2011-FEV1 65%, ratio 74, DLCO 48%, +BD , FVC 64%.   PFTs 04/2013 ratio 77, FEV1 66%, FVC 67%, DLCO 64% >> moderate restriction  Echo 04/2016-normal LVEF, grade 1 diastolic dysfunction  CT angiogram 12/2013 no evidence of fibrosis Chest x-ray 03/2016 elevated left hemidiaphragm  10/06/2016 Follow up : Asthma /O2 RF  Pt returns for 4 month  follow up . Patient says that her asthma is been doing okay. She did have some increased wheezing the last 2 days with weather changes. But she took her albuterol inhaler times, once and symptoms resolved. She denies any chest pain, orthopnea, PND, or increased leg swelling. Patient remains on Symbicort twice daily. Patient is on oxygen 2 L with activity and at bedtime. Flu shot is up-to-date   Allergies  Allergen Reactions  . Oseltamivir Phosphate     TAMIFLU REACTION: nausea, vomiting, diarrhea, dizziness  . Sulfa Antibiotics Other (See Comments)    CP  . Ace Inhibitors   . Indomethacin   . Montelukast Sodium     REACTION: Heart palpitations, chest pain  . Sulfonamide Derivatives     Immunization History  Administered Date(s) Administered  . Influenza Split 12/08/2011  . Influenza Whole 10/20/2006, 11/09/2007, 11/14/2008, 10/30/2009  . Influenza, High Dose Seasonal PF 09/25/2015, 10/21/2015  . Influenza,inj,Quad PF,6+ Mos 10/20/2012, 11/28/2013, 10/02/2014, 09/18/2016  . Pneumococcal Conjugate-13 02/21/2013  . Pneumococcal Polysaccharide-23 06/30/2006  . Td 06/01/2007    Past Medical History:  Diagnosis Date  . Abnormal glucose tolerance test   . Abnormal TSH 09/22/2016  . ACE-inhibitor  cough   . Anemia 03/12/2014  . Arthritis   . Asthma    PFT 02/06/09 FEV1 1.41 (65%), FVC 1.92 (64), FEV1% 74, TLC 3.47 (71%), DLCO 48%, +BD  . Atypical chest pain    s/p cath, Normal coronaries, Non ST elevation myocardial infarction, Rt groin pseudoaneurysm  . Bacterial vaginosis 03/12/2014  . Cellulitis 06/22/2016  . Chronic kidney disease (CKD), stage III (moderate) 08/04/2016  . COPD (chronic obstructive pulmonary disease) ([place])   . Depression   . Diastolic heart failure ([place]) 04/07/2016  . DVT (deep venous thrombosis) ([place]) 1987  . Fall at home 12/04/2014  . GERD (gastroesophageal reflux disease)   . Gout   . Hypercalcemia 10/15/2014  . Hyperlipidemia   . Hypertension   . Hypoxia 10/15/2014  . Macular degeneration 04/10/2015  . Osteopenia 12/29/2006   Qualifier: Diagnosis of  By: Wynona Luna   . Pneumonia   . 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 Renal insufficiency 09/22/2016  . Sun-damaged skin 10/24/2012  . Unspecified constipation 06/05/2013  . Urinary frequency 10/24/2012  . UTI (lower urinary tract infection) 10/24/2012  . Vitamin D deficiency 01/01/2015    Tobacco History: History  Smoking Status  . Never Smoker  Smokeless Tobacco  . Never Used   Counseling given: Not Answered   Outpatient Encounter Prescriptions as of 10/06/2016  Medication Sig  . acetaminophen (TYLENOL) 500 MG tablet Take 500 mg by mouth every 6 (six) hours as needed.  Marland Kitchen albuterol (VENTOLIN HFA) 108 (90 Base) MCG/ACT inhaler Inhale 2 puffs  into the lungs 4 (four) times daily as needed for wheezing or shortness of breath.  . allopurinol (ZYLOPRIM) 100 MG tablet TAKE 2 TABLETS ONE TIME DAILY  . aspirin EC 81 MG tablet Take 1 tablet (81 mg total) by mouth daily.  Marland Kitchen b complex vitamins tablet Take 1 tablet by mouth daily.  . budesonide-formoterol (SYMBICORT) 160-4.5 MCG/ACT inhaler INHALE 2 PUFFS INTO THE LUNGS 2 TIMES DAILY  . Cyanocobalamin (VITAMIN B-12 PO) Take by mouth daily.  . famotidine  (PEPCID) 20 MG tablet Take 1 tablet (20 mg total) by mouth at bedtime.  . Ferrous Fumarate-Folic Acid (HEMOCYTE-F) 324-1 MG TABS Take 1 tablet by mouth daily.  . fluticasone (FLONASE) 50 MCG/ACT nasal spray Place 1 spray into both nostrils daily.  . furosemide (LASIX) 40 MG tablet 1 tab po daily and a 2nd tab po daily prn increased edema, wt gain>3 #/24 hour  . glucose blood (ONE TOUCH TEST STRIPS) test strip Use as instructed to check blood sugar once weekly. DX 790.29   . loratadine (CLARITIN) 10 MG tablet Take 1 tablet (10 mg total) by mouth at bedtime.  . metolazone (ZAROXOLYN) 2.5 MG tablet 1 tab po daily 1/2 hour prior to Lasix prn pedal edema, increasing SOB, weight gain >3#  . metoprolol succinate (TOPROL-XL) 50 MG 24 hr tablet Take 75 mg (1.5 tablets) daily.  . montelukast (SINGULAIR) 10 MG tablet Take 1 tablet (10 mg total) by mouth at bedtime as needed.  . Niacin (VITAMIN B-3 PO) Take 1 tablet by mouth daily.  . OXYGEN Inhale into the lungs.  . pantoprazole (PROTONIX) 40 MG tablet TAKE 1 TABLET TWICE DAILY  . potassium chloride SA (K-DUR,KLOR-CON) 20 MEQ tablet Take 1 tablet (20 mEq total) by mouth daily. And a 2nd tab po daily prn extra Furosemide tab  . pravastatin (PRAVACHOL) 20 MG tablet TAKE 1 TABLET EVERY DAY  . Probiotic Product (PROBIOTIC PO) Take by mouth daily.  . ranitidine (ZANTAC) 300 MG tablet TAKE 1 TABLET AT BEDTIME  . sertraline (ZOLOFT) 100 MG tablet TAKE 1 TABLET (100 MG TOTAL) BY MOUTH DAILY.  Marland Kitchen SPIRIVA HANDIHALER 18 MCG inhalation capsule PLACE 1 CAPSULE (18 MCG TOTAL) INTO INHALER AND INHALE CONTENTS ONE TIME DAILY.  . traMADol (ULTRAM) 50 MG tablet Take 1 tablet (50 mg total) by mouth 3 (three) times daily as needed.  . [DISCONTINUED] doxycycline (VIBRA-TABS) 100 MG tablet Take 1 tablet (100 mg total) by mouth 2 (two) times daily. (Patient not taking: Reported on 10/06/2016)   No facility-administered encounter medications on file as of 10/06/2016.      Review  of Systems  Constitutional:   No  weight loss, night sweats,  Fevers, chills, fatigue, or  lassitude.  HEENT:   No headaches,  Difficulty swallowing,  Tooth/dental problems, or  Sore throat,                No sneezing, itching, ear ache, nasal congestion, post nasal drip,   CV:  No chest pain,  Orthopnea, PND  GI  No heartburn, indigestion, abdominal pain, nausea, vomiting, diarrhea, change in bowel habits, loss of appetite, bloody stools.   Resp:    No chest wall deformity  Skin: no rash or lesions.  GU: no dysuria, change in color of urine, no urgency or frequency.  No flank pain, no hematuria   MS:  No joint pain or swelling.  No decreased range of motion.  No back pain.    Physical Exam  BP 132/74 (  BP Location: Left Arm, Cuff Size: Large)   Pulse 99   Ht 5\' 5"  (1.651 m)   Wt 201 lb 6.4 oz (91.4 kg)   SpO2 95%   BMI 33.51 kg/m   GEN: A/Ox3; pleasant , NAD, obese    HEENT:  [place]/AT,  EACs-clear, TMs-wnl, NOSE-clear, THROAT-clear, no lesions, no postnasal drip or exudate noted.   NECK:  Supple w/ fair ROM; no JVD; normal carotid impulses w/o bruits; no thyromegaly or nodules palpated; no lymphadenopathy.    RESP  Clear  P & A; w/o, wheezes/ rales/ or rhonchi. no accessory muscle use, no dullness to percussion  CARD:  RRR, no m/r/g, 1+ peripheral edema, pulses intact, no cyanosis or clubbing.  GI:   Soft & nt; nml bowel sounds; no organomegaly or masses detected.   Musco: Warm bil, no deformities or joint swelling noted.   Neuro: alert, no focal deficits noted.    Skin: Warm, no lesions or rashes    Lab Results:  CBC  BMET   BNP   Imaging: No results found.   Assessment & Plan:   Asthma, moderate persistent Well controlled on current regimen   Plan  Patient Instructions  Continue on Symbicort Twice daily   Continue with oxygen 2l/m with activity  And At bedtime  .  Follow Up with Dr. Elsworth Soho  In 6 months and As needed        Chronic  respiratory failure ([place]) Cont on O2      Marteze Vecchio, NP 10/06/2016

## 2016-10-06 NOTE — Assessment & Plan Note (Signed)
Cont on O2 .  

## 2016-10-06 NOTE — Patient Instructions (Signed)
Continue on Symbicort Twice daily   Continue with oxygen 2l/m with activity  And At bedtime  .  Follow Up with Dr. Elsworth Soho  In 6 months and As needed

## 2016-10-06 NOTE — Assessment & Plan Note (Signed)
Well controlled on current regimen   Plan  Patient Instructions  Continue on Symbicort Twice daily   Continue with oxygen 2l/m with activity  And At bedtime  .  Follow Up with Dr. Elsworth Soho  In 6 months and As needed

## 2016-10-08 DIAGNOSIS — N184 Chronic kidney disease, stage 4 (severe): Secondary | ICD-10-CM | POA: Diagnosis not present

## 2016-10-08 DIAGNOSIS — E1129 Type 2 diabetes mellitus with other diabetic kidney complication: Secondary | ICD-10-CM | POA: Diagnosis not present

## 2016-10-08 DIAGNOSIS — D631 Anemia in chronic kidney disease: Secondary | ICD-10-CM | POA: Diagnosis not present

## 2016-10-08 DIAGNOSIS — I739 Peripheral vascular disease, unspecified: Secondary | ICD-10-CM | POA: Diagnosis not present

## 2016-10-08 DIAGNOSIS — R809 Proteinuria, unspecified: Secondary | ICD-10-CM | POA: Diagnosis not present

## 2016-10-08 DIAGNOSIS — I5030 Unspecified diastolic (congestive) heart failure: Secondary | ICD-10-CM | POA: Diagnosis not present

## 2016-10-08 DIAGNOSIS — I129 Hypertensive chronic kidney disease with stage 1 through stage 4 chronic kidney disease, or unspecified chronic kidney disease: Secondary | ICD-10-CM | POA: Diagnosis not present

## 2016-10-08 DIAGNOSIS — Z6834 Body mass index (BMI) 34.0-34.9, adult: Secondary | ICD-10-CM | POA: Diagnosis not present

## 2016-10-13 ENCOUNTER — Other Ambulatory Visit: Payer: Self-pay | Admitting: Nephrology

## 2016-10-13 DIAGNOSIS — N184 Chronic kidney disease, stage 4 (severe): Secondary | ICD-10-CM

## 2016-10-17 ENCOUNTER — Ambulatory Visit
Admission: RE | Admit: 2016-10-17 | Discharge: 2016-10-17 | Disposition: A | Discharge status: Home / Self Care | Payer: Medicare HMO | Source: Ambulatory Visit | Attending: Nephrology | Admitting: Nephrology

## 2016-10-17 DIAGNOSIS — N184 Chronic kidney disease, stage 4 (severe): Secondary | ICD-10-CM | POA: Diagnosis not present

## 2016-10-20 DIAGNOSIS — J4520 Mild intermittent asthma, uncomplicated: Secondary | ICD-10-CM | POA: Diagnosis not present

## 2016-10-20 DIAGNOSIS — R0902 Hypoxemia: Secondary | ICD-10-CM | POA: Diagnosis not present

## 2016-10-21 ENCOUNTER — Encounter: Payer: Self-pay | Admitting: Family Medicine

## 2016-10-21 ENCOUNTER — Ambulatory Visit (INDEPENDENT_AMBULATORY_CARE_PROVIDER_SITE_OTHER): Payer: Medicare HMO | Admitting: Family Medicine

## 2016-10-21 VITALS — BP 110/58 | HR 74 | Temp 97.8°F | Resp 18 | Wt 204.8 lb

## 2016-10-21 DIAGNOSIS — E1165 Type 2 diabetes mellitus with hyperglycemia: Secondary | ICD-10-CM | POA: Diagnosis not present

## 2016-10-21 DIAGNOSIS — R82998 Other abnormal findings in urine: Secondary | ICD-10-CM | POA: Diagnosis not present

## 2016-10-21 DIAGNOSIS — M8589 Other specified disorders of bone density and structure, multiple sites: Secondary | ICD-10-CM

## 2016-10-21 DIAGNOSIS — N390 Urinary tract infection, site not specified: Secondary | ICD-10-CM | POA: Diagnosis not present

## 2016-10-21 DIAGNOSIS — K5900 Constipation, unspecified: Secondary | ICD-10-CM

## 2016-10-21 DIAGNOSIS — K219 Gastro-esophageal reflux disease without esophagitis: Secondary | ICD-10-CM | POA: Diagnosis not present

## 2016-10-21 DIAGNOSIS — E782 Mixed hyperlipidemia: Secondary | ICD-10-CM | POA: Diagnosis not present

## 2016-10-21 DIAGNOSIS — N289 Disorder of kidney and ureter, unspecified: Secondary | ICD-10-CM

## 2016-10-21 DIAGNOSIS — R51 Headache: Secondary | ICD-10-CM

## 2016-10-21 DIAGNOSIS — I10 Essential (primary) hypertension: Secondary | ICD-10-CM

## 2016-10-21 DIAGNOSIS — M109 Gout, unspecified: Secondary | ICD-10-CM | POA: Diagnosis not present

## 2016-10-21 DIAGNOSIS — R519 Headache, unspecified: Secondary | ICD-10-CM

## 2016-10-21 MED ORDER — RANITIDINE HCL 150 MG PO TABS: 150.0000 mg | ORAL_TABLET | Freq: Two times a day (BID) | ORAL | 3 refills | Status: DC

## 2016-10-21 NOTE — Patient Instructions (Addendum)
For constipation: Encouraged increased hydration and fiber in diet. Daily probiotics. If bowels not moving can use MOM 2 tbls po in 4 oz of warm prune juice by mouth every 2-3 days. If no results then repeat in 4 hours with  Dulcolax suppository pr, may repeat again in 4 more hours as needed. Seek care if symptoms worsen. Consider daily Miralax and/or Dulcolax if symptoms persist.  If persistst mix Miralax and Benefiber together and take it once to twice daily    Chronic Kidney Disease, Adult Chronic kidney disease (CKD) occurs when the kidneys are damaged during a period of 3 or more months. The kidneys are two organs that do many important jobs in the body, which include:  Removing wastes and extra fluids from the blood.  Making hormones that maintain the amount of fluid in your tissues and blood vessels.  Maintaining the right amount of fluids and chemicals in the body.  A small amount of kidney damage may not cause problems, but a large amount of damage may make it difficult or impossible for the kidneys to work the way they should. If steps are not taken to slow down the kidney damage or stop it from getting worse, the kidneys may stop working permanently (end stage kidney disease). Most of the time, CKD does not go away, but it can often be controlled. People who have CKD can usually live normal lives. What are the causes? The most common causes of this condition are diabetes and high blood pressure (hypertension). Other causes include:  Heart and blood vessel (cardiovascular) disease.  Kidney       diseases: ? Glomerulonephritis. ? Interstitial nephritis. ? Polycystic kidney disease. ? Renal vascular disease.  Diseases that affect the immune system.  Genetic diseases.  Medicines that damage the kidneys, such as anti-inflammatory medicines.  Poisoning.  Being around or in contact with poisonous (toxic) substances.  A kidney or urinary infection that occurs again  (recurs).  Vasculitis.  Repeat kidney infections.  A problem with urine flow that may be caused by: ? Cancer. ? Having kidney stones more than one time. ? An enlarged prostate in males.  What increases the risk? This condition is more likely to develop in people who are:  Older than age 54.  Female.  Of African-American descent.  Current smokers or former smokers.  Obese.  You may also have an increased risk for CKD if you have a family history of CKDor youfrequently take medicines that are damaging to the kidneys. What are the signs or symptoms? Symptoms develop slowly and may not be obvious until the kidney damage becomes severe. It is possible to have a kidney disease for years without showing any symptoms. Symptoms of this condition can include:  Swelling (edema) of the face, legs, ankles, or feet.  Numbness, tingling, or loss of feeling (sensation) in the hands or feet.  Tiredness (lethargy).  Nausea or vomiting.  Confusion or trouble concentrating.  Problems with urination, such as: ? Painful or burning feeling during urination. ? Decreased urine production. ? Frequent urination, especially at night. ? Bloody urine.  Muscle twitches and cramps, especially in the legs.  Shortness of breath.  Weakness.  Constant itchiness.  Loss of appetite.  Metallic taste in the mouth.  Trouble sleeping.  Pale lining of the eyelids and surface of the eye (conjunctiva).  How is this diagnosed? This condition may be diagnosed with various tests. Tests may include:  Blood tests.  Urine tests.  Imaging tests.  A test in which a sample of tissue is removed from the kidneys to be looked at under a microscope (kidney biopsy).  These test results will help your health care provider determine what class of CKD you have. How is this treated? Most cases of CKD cannot be cured. Treatment usually involves relieving symptoms and preventing or slowing the progression  of the disease. Treatment may include:  A special diet, which may require you to avoid alcohol, salty foods (sodium), and foods that are high in potassium, calcium, and protein.  Medicines: ? To lower blood pressure. ? To relieve low blood count (anemia). ? To relieve swelling. ? To protect your bones. ? To improve the balance of electrolytes in your blood.  Removing toxic waste from the body by using hemodialysis or peritoneal dialysis if the kidneys can no longer do their job (kidney failure).  Management of other conditions that are causing your CKD or making it worse.  Follow these instructions at home:  Follow your prescribed diet.  Take over-the-counter and prescription medicines only as told by your health care provider. ? Do not take any new medicines unless approved by your health care provider. Many medicines can worsen your kidney damage. ? Do not take any vitamin and mineral supplements unless approved by your health care provider. Many nutritional supplements can worsen your kidney damage. ? The dose of some medicines that you take may need to be adjusted.  Do not use any tobacco products, such as cigarettes, chewing tobacco, and e-cigarettes. If you need help quitting, ask your health care provider.  Keep all follow-up visits as told by your health care provider. This is important.  Keep track of your blood pressure. Report changes in your blood pressure as told by your health care provider.  Achieve and maintain a healthy weight. If you need help with this, ask your health care provider.  Start or continue an exercise plan. Try to exercise at least 30 minutes a day, 5 days a week.  Stay current with immunizations as told by your health care provider. Where to find more information:  American Association of Kidney Patients: BombTimer.gl  National Kidney Foundation: www.kidney.[place]: https://mathis.com/  [facility]:  www.lifeoptions.org and www.kidneyschool.org Contact a health care provider if:  Your symptoms get worse.  You develop new symptoms. Get help right away if:  You develop symptoms of end-stage kidney disease, which include: ? Headaches. ? Abnormally dark or light skin. ? Numbness in the hands or feet. ? Easy bruising. ? Frequent hiccups. ? Chest pain. ? Shortness of breath. ? End of menstruation in women.  You have a fever.  You have decreased urine production.  You have pain or bleeding when you urinate. This information is not intended to replace advice given to you by your health care provider. Make sure you discuss any questions you have with your health care provider. Document Released: 10/16/2007 Document Revised: 06/14/2015 Document Reviewed: 09/05/2011 Elsevier Interactive Patient Education  2017 [employer].

## 2016-10-21 NOTE — Progress Notes (Signed)
Subjective:  I acted as a Education administrator for Dr. Charlett Blake. [place], [place]  Patient ID: [name], female    DOB: 01-14-1941, 76 y.o.   MRN: [id number]  No chief complaint on file.   HPI  Patient is in today for a kidney follow up. She is feeling well today is scared after her recent visit to nephrology. She is scared about possibly needing to go on dialysis and not understanding about how the progression of renal insufficiency occurs. She also notes ongoing trouble with headaches starting at occiput and generalizing. She also notes weakness and dizziness. No recent febrile illness or hospitalizations. Denies CP/palp/SOB/HA/congestion/fevers/GI or GU c/o. Taking meds as prescribed  Patient Care Team: Mosie Lukes, MD as PCP - General (Family Medicine) Elsie Stain, MD as Consulting Physician (Pulmonary Disease) Shirley Muscat Loreen Freud, MD as Referring Physician (Optometry) Parrett, Fonnie Mu, NP as Nurse Practitioner (Pulmonary Disease) Bo Merino, MD as Consulting Physician (Rheumatology)   Past Medical History:  Diagnosis Date  . Abnormal glucose tolerance test   . Abnormal TSH 09/22/2016  . ACE-inhibitor cough   . Anemia 03/12/2014  . Arthritis   . Asthma    PFT 02/06/09 FEV1 1.41 (65%), FVC 1.92 (64), FEV1% 74, TLC 3.47 (71%), DLCO 48%, +BD  . Atypical chest pain    s/p cath, Normal coronaries, Non ST elevation myocardial infarction, Rt groin pseudoaneurysm  . Bacterial vaginosis 03/12/2014  . Cellulitis 06/22/2016  . Chronic kidney disease (CKD), stage III (moderate) ([place]) 08/04/2016  . COPD (chronic obstructive pulmonary disease) ([place])   . Depression   . Diastolic heart failure ([place]) 04/07/2016  . DVT (deep venous thrombosis) ([place]) 1987  . Fall at home 12/04/2014  . GERD (gastroesophageal reflux disease)   . Gout   . Hypercalcemia 10/15/2014  . Hyperlipidemia   . Hypertension   . Hypoxia 10/15/2014  . Macular degeneration 04/10/2015  . Osteopenia 12/29/2006   Qualifier:  Diagnosis of  By: Wynona Luna   . Pneumonia   . Polymyalgia rheumatica ([place]) 01/07/2016  . Renal insufficiency 09/22/2016  . Sun-damaged skin 10/24/2012  . Unspecified constipation 06/05/2013  . Urinary frequency 10/24/2012  . UTI (lower urinary tract infection) 10/24/2012  . Vitamin D deficiency 01/01/2015    Past Surgical History:  Procedure Laterality Date  . APPENDECTOMY  1951  . TONSILLECTOMY  1950  . TUBAL LIGATION  1968    Family History  Problem Relation Age of Onset  . Asthma Sister   . Arthritis Sister   . Hypertension Sister   . Hyperlipidemia Sister   . Uterine cancer Sister   . Coronary artery disease Brother        x2  . Arthritis Brother   . Lung cancer Brother        smoker  . Hypertension Sister   . Arthritis Sister   . Hyperlipidemia Sister   . Emphysema Sister   . COPD Sister        smoker  . Heart disease Sister   . Hyperlipidemia Sister   . Hypertension Sister   . Arthritis Sister   . Emphysema Brother   . Heart disease Brother         smoker  . Heart attack Brother   . Mental illness Father   . Alcohol abuse Father   . Suicidality Father   . Heart disease Mother   . Hyperlipidemia Mother   . Heart attack Mother   . Epilepsy Daughter   .  Hypertension Daughter   . Obesity Daughter   . COPD Brother        smoker  . Lung cancer Brother   . Coronary artery disease Other   . Colon polyps Sister     Social History   Social History  . Marital status: Widowed    Spouse name: N/A  . Number of children: 1  . Years of education: N/A   Occupational History  . Retired [employer]   Social History Main Topics  . Smoking status: Never Smoker  . Smokeless tobacco: Never Used  . Alcohol use No  . Drug use: No  . Sexual activity: No     Comment: lives alone, no dietary restrictions   Other Topics Concern  . Not on file   Social History Narrative  . No narrative on file    Outpatient Medications Prior to Visit  Medication Sig  Dispense Refill  . acetaminophen (TYLENOL) 500 MG tablet Take 500 mg by mouth every 6 (six) hours as needed.    Marland Kitchen albuterol (VENTOLIN HFA) 108 (90 Base) MCG/ACT inhaler Inhale 2 puffs into the lungs 4 (four) times daily as needed for wheezing or shortness of breath. 3 Inhaler 0  . allopurinol (ZYLOPRIM) 100 MG tablet TAKE 2 TABLETS ONE TIME DAILY 180 tablet 1  . aspirin EC 81 MG tablet Take 1 tablet (81 mg total) by mouth daily.    Marland Kitchen b complex vitamins tablet Take 1 tablet by mouth daily.    . budesonide-formoterol (SYMBICORT) 160-4.5 MCG/ACT inhaler INHALE 2 PUFFS INTO THE LUNGS 2 TIMES DAILY 3 Inhaler 1  . Cyanocobalamin (VITAMIN B-12 PO) Take by mouth daily.    . Ferrous Fumarate-Folic Acid (HEMOCYTE-F) 324-1 MG TABS Take 1 tablet by mouth daily. 90 each 1  . fluticasone (FLONASE) 50 MCG/ACT nasal spray Place 1 spray into both nostrils daily. 16 g 3  . furosemide (LASIX) 40 MG tablet 1 tab po daily and a 2nd tab po daily prn increased edema, wt gain>3 #/24 hour 100 tablet 5  . glucose blood (ONE TOUCH TEST STRIPS) test strip Use as instructed to check blood sugar once weekly. DX 790.29     . loratadine (CLARITIN) 10 MG tablet Take 1 tablet (10 mg total) by mouth at bedtime. 30 tablet 5  . metolazone (ZAROXOLYN) 2.5 MG tablet 1 tab po daily 1/2 hour prior to Lasix prn pedal edema, increasing SOB, weight gain >3# 20 tablet 1  . metoprolol succinate (TOPROL-XL) 50 MG 24 hr tablet Take 75 mg (1.5 tablets) daily. 135 tablet 3  . montelukast (SINGULAIR) 10 MG tablet Take 1 tablet (10 mg total) by mouth at bedtime as needed. 30 tablet 3  . Niacin (VITAMIN B-3 PO) Take 1 tablet by mouth daily.    . OXYGEN Inhale into the lungs.    . potassium chloride SA (K-DUR,KLOR-CON) 20 MEQ tablet Take 1 tablet (20 mEq total) by mouth daily. And a 2nd tab po daily prn extra Furosemide tab 100 tablet 5  . pravastatin (PRAVACHOL) 20 MG tablet TAKE 1 TABLET EVERY DAY 90 tablet 1  . Probiotic Product (PROBIOTIC PO)  Take by mouth daily.    . sertraline (ZOLOFT) 100 MG tablet TAKE 1 TABLET (100 MG TOTAL) BY MOUTH DAILY. 90 tablet 3  . SPIRIVA HANDIHALER 18 MCG inhalation capsule PLACE 1 CAPSULE (18 MCG TOTAL) INTO INHALER AND INHALE CONTENTS ONE TIME DAILY. 90 capsule 1  . traMADol (ULTRAM) 50 MG tablet Take 1 tablet (50 mg  total) by mouth 3 (three) times daily as needed. 90 tablet 0  . famotidine (PEPCID) 20 MG tablet Take 1 tablet (20 mg total) by mouth at bedtime. 30 tablet 0  . pantoprazole (PROTONIX) 40 MG tablet TAKE 1 TABLET TWICE DAILY 180 tablet 1  . ranitidine (ZANTAC) 300 MG tablet TAKE 1 TABLET AT BEDTIME 90 tablet 3   No facility-administered medications prior to visit.     Allergies  Allergen Reactions  . Oseltamivir Phosphate     TAMIFLU REACTION: nausea, vomiting, diarrhea, dizziness  . Sulfa Antibiotics Other (See Comments)    CP  . Ace Inhibitors   . Indomethacin   . Montelukast Sodium     REACTION: Heart palpitations, chest pain  . Sulfonamide Derivatives     Review of Systems  Constitutional: Positive for malaise/fatigue. Negative for chills and fever.  HENT: Negative for congestion and hearing loss.   Eyes: Negative for discharge.  Respiratory: Negative for cough, sputum production and shortness of breath.   Cardiovascular: Negative for chest pain, palpitations and leg swelling.  Gastrointestinal: Negative for abdominal pain, blood in stool, constipation, diarrhea, heartburn, nausea and vomiting.  Genitourinary: Negative for dysuria, frequency, hematuria and urgency.  Musculoskeletal: Negative for back pain, falls and myalgias.  Skin: Negative for rash.  Neurological: Positive for dizziness, weakness and headaches. Negative for sensory change and loss of consciousness.  Endo/Heme/Allergies: Negative for environmental allergies. Does not bruise/bleed easily.  Psychiatric/Behavioral: Negative for depression and suicidal ideas. The patient is not nervous/anxious and does not  have insomnia.        Objective:    Physical Exam  Constitutional: She is oriented to person, place, and time. She appears well-developed and well-nourished. No distress.  HENT:  Head: Normocephalic and atraumatic.  Eyes: Conjunctivae are normal.  Neck: Neck supple. No thyromegaly present.  Cardiovascular: Normal rate, regular rhythm and normal heart sounds.   No murmur heard. Pulmonary/Chest: Effort normal and breath sounds normal. No respiratory distress.  Abdominal: Soft. Bowel sounds are normal. She exhibits no distension and no mass. There is no tenderness.  Musculoskeletal: She exhibits no edema.  Lymphadenopathy:    She has no cervical adenopathy.  Neurological: She is alert and oriented to person, place, and time.  Skin: Skin is warm and dry.  Psychiatric: She has a normal mood and affect. Her behavior is normal.    BP (!) 110/58 (BP Location: Left Arm, Patient Position: Sitting, Cuff Size: Normal)   Pulse 74   Temp 97.8 F (36.6 C) (Oral)   Resp 18   Wt 204 lb 12.8 oz (92.9 kg)   SpO2 97%   BMI 34.08 kg/m  Wt Readings from Last 3 Encounters:  10/21/16 204 lb 12.8 oz (92.9 kg)  10/06/16 201 lb 6.4 oz (91.4 kg)  10/01/16 207 lb 12.8 oz (94.3 kg)   BP Readings from Last 3 Encounters:  10/21/16 (!) 110/58  10/06/16 132/74  10/01/16 136/74     Immunization History  Administered Date(s) Administered  . Influenza Split 12/08/2011  . Influenza Whole 10/20/2006, 11/09/2007, 11/14/2008, 10/30/2009  . Influenza, High Dose Seasonal PF 09/25/2015, 10/21/2015  . Influenza,inj,Quad PF,6+ Mos 10/20/2012, 11/28/2013, 10/02/2014, 09/18/2016  . Pneumococcal Conjugate-13 02/21/2013  . Pneumococcal Polysaccharide-23 06/30/2006  . Td 06/01/2007    Health Maintenance  Topic Date Due  . URINE MICROALBUMIN  03/01/2015  . OPHTHALMOLOGY EXAM  10/08/2015  . FOOT EXAM  04/09/2016  . HEMOGLOBIN A1C  04/21/2017  . TETANUS/TDAP  05/31/2017  . COLONOSCOPY  11/10/2018  .  INFLUENZA VACCINE  Completed  . DEXA SCAN  Completed  . PNA vac Low Risk Adult  Completed    Lab Results  Component Value Date   WBC 6.3 10/21/2016   HGB 11.3 (L) 10/21/2016   HCT 34.7 (L) 10/21/2016   PLT 143.0 (L) 10/21/2016   GLUCOSE 87 10/21/2016   CHOL 144 10/21/2016   TRIG 132.0 10/21/2016   HDL 46.80 10/21/2016   LDLDIRECT 142.7 12/16/2005   LDLCALC 71 10/21/2016   ALT 11 10/21/2016   AST 15 10/21/2016   NA 140 10/21/2016   K 4.8 10/21/2016   CL 101 10/21/2016   CREATININE 1.61 (H) 10/21/2016   BUN 44 (H) 10/21/2016   CO2 32 10/21/2016   TSH 4.73 (H) 08/18/2016   INR 1.0 09/12/2006   HGBA1C 5.9 10/21/2016   MICROALBUR 0.8 02/28/2014    Lab Results  Component Value Date   TSH 4.73 (H) 08/18/2016   Lab Results  Component Value Date   WBC 6.3 10/21/2016   HGB 11.3 (L) 10/21/2016   HCT 34.7 (L) 10/21/2016   MCV 99.2 10/21/2016   PLT 143.0 (L) 10/21/2016   Lab Results  Component Value Date   NA 140 10/21/2016   K 4.8 10/21/2016   CO2 32 10/21/2016   GLUCOSE 87 10/21/2016   BUN 44 (H) 10/21/2016   CREATININE 1.61 (H) 10/21/2016   BILITOT 0.7 10/21/2016   ALKPHOS 48 10/21/2016   AST 15 10/21/2016   ALT 11 10/21/2016   PROT 7.1 10/21/2016   ALBUMIN 4.1 10/21/2016   CALCIUM 10.0 10/21/2016   ANIONGAP 7 02/14/2016   GFR 33.11 (L) 10/21/2016   Lab Results  Component Value Date   CHOL 144 10/21/2016   Lab Results  Component Value Date   HDL 46.80 10/21/2016   Lab Results  Component Value Date   LDLCALC 71 10/21/2016   Lab Results  Component Value Date   TRIG 132.0 10/21/2016   Lab Results  Component Value Date   CHOLHDL 3 10/21/2016   Lab Results  Component Value Date   HGBA1C 5.9 10/21/2016         Assessment & Plan:   Problem List Items Addressed This Visit    Essential hypertension    Well controlled, no changes to meds. Encouraged heart healthy diet such as the DASH diet and exercise as tolerated.       GERD    Avoid  offending foods, start probiotics. Do not eat large meals in late evening and consider raising head of bed. Discussed at length how to manage her symptoms with the least meds and risk. Will try Ranitidine bid and if this works will stay off Pantoprazole. If she has significant symptoms will need to restart the Pantoprazole.       Relevant Medications   ranitidine (ZANTAC) 150 MG tablet   Hyperlipidemia, mixed   Relevant Orders   Lipid panel (Completed)   Gout   Relevant Orders   Uric acid (Completed)   Osteopenia    Encouraged to get adequate exercise, calcium and vitamin d intake      Lower urinary tract infectious disease    Urine culture negative and patient feeling better, no changes      Constipation   Renal insufficiency    Is now established with nephrology and she is going for dialysis classes to learn about the what that might look like moving forward. Her creatinine is improved some today. Reminded to maintain adequate  hydration and avoid OTC meds unless she discusses with [place].       Nonintractable headache    Increased intensity and frequency of headahces recently. Headache typically occurs initially at occiput but then generalizes. She also has some weakness and dizziness at times. Will proceed with CT of head. Encouraged increased hydration, 64 ounces of clear fluids daily. Minimize alcohol and caffeine. Eat small frequent meals with lean proteins and complex carbs. Avoid high and low blood sugars. Get adequate sleep, 7-8 hours a night. Needs exercise daily preferably in the morning.      Relevant Orders   CT Head Wo Contrast (Completed)   Leukocytes in urine - Primary   Relevant Orders   CBC with Differential/Platelet (Completed)   Urinalysis   Urine Culture (Completed)    Other Visit Diagnoses    Type 2 diabetes mellitus with hyperglycemia, unspecified whether long term insulin use (HCC)       Relevant Orders   Hemoglobin A1c (Completed)   Comprehensive metabolic  panel (Completed)      I have discontinued Ms. Mccleave's famotidine, ranitidine, and pantoprazole. I am also having her start on ranitidine. Additionally, I am having her maintain her glucose blood, acetaminophen, Niacin (VITAMIN B-3 PO), Cyanocobalamin (VITAMIN B-12 PO), OXYGEN, b complex vitamins, Probiotic Product (PROBIOTIC PO), aspirin EC, montelukast, SPIRIVA HANDIHALER, loratadine, fluticasone, metolazone, sertraline, metoprolol succinate, budesonide-formoterol, albuterol, potassium chloride SA, Ferrous Fumarate-Folic Acid, furosemide, allopurinol, traMADol, and pravastatin.  Meds ordered this encounter  Medications  . ranitidine (ZANTAC) 150 MG tablet    Sig: Take 1 tablet (150 mg total) by mouth 2 (two) times daily.    Dispense:  180 tablet    Refill:  3    CMA served as scribe during this visit. History, Physical and Plan performed by medical provider. Documentation and orders reviewed and attested to.  Penni Homans, MD

## 2016-10-22 ENCOUNTER — Ambulatory Visit (HOSPITAL_BASED_OUTPATIENT_CLINIC_OR_DEPARTMENT_OTHER)
Admission: RE | Admit: 2016-10-22 | Discharge: 2016-10-22 | Disposition: A | Discharge status: Home / Self Care | Payer: Medicare HMO | Source: Ambulatory Visit | Attending: Family Medicine | Admitting: Family Medicine

## 2016-10-22 DIAGNOSIS — R51 Headache: Secondary | ICD-10-CM | POA: Insufficient documentation

## 2016-10-22 DIAGNOSIS — R519 Headache, unspecified: Secondary | ICD-10-CM

## 2016-10-22 LAB — URINALYSIS, ROUTINE W REFLEX MICROSCOPIC
Bilirubin Urine: NEGATIVE
Hgb urine dipstick: NEGATIVE
KETONES UR: NEGATIVE
Nitrite: NEGATIVE
PH: 5.5 (ref 5.0–8.0)
RBC / HPF: NONE SEEN (ref 0–?)
SPECIFIC GRAVITY, URINE: 1.01 (ref 1.000–1.030)
Total Protein, Urine: NEGATIVE
UROBILINOGEN UA: 0.2 (ref 0.0–1.0)
Urine Glucose: NEGATIVE

## 2016-10-22 LAB — LIPID PANEL
CHOL/HDL RATIO: 3
Cholesterol: 144 mg/dL (ref 0–200)
HDL: 46.8 mg/dL (ref >39.00)
LDL Cholesterol: 71 mg/dL (ref 0–99)
NONHDL: 96.94
Triglycerides: 132 mg/dL (ref 0.0–149.0)
VLDL: 26.4 mg/dL (ref 0.0–40.0)

## 2016-10-22 LAB — COMPREHENSIVE METABOLIC PANEL
ALBUMIN: 4.1 g/dL (ref 3.5–5.2)
ALT: 11 U/L (ref 0–35)
AST: 15 U/L (ref 0–37)
Alkaline Phosphatase: 48 U/L (ref 39–117)
BUN: 44 mg/dL — ABNORMAL HIGH (ref 6–23)
CALCIUM: 10 mg/dL (ref 8.4–10.5)
CHLORIDE: 101 meq/L (ref 96–112)
CO2: 32 mEq/L (ref 19–32)
Creatinine, Ser: 1.61 mg/dL — ABNORMAL HIGH (ref 0.40–1.20)
GFR: 33.11 mL/min — AB (ref >60.00)
Glucose, Bld: 87 mg/dL (ref 70–99)
POTASSIUM: 4.8 meq/L (ref 3.5–5.1)
Sodium: 140 mEq/L (ref 135–145)
Total Bilirubin: 0.7 mg/dL (ref 0.2–1.2)
Total Protein: 7.1 g/dL (ref 6.0–8.3)

## 2016-10-22 LAB — CBC WITH DIFFERENTIAL/PLATELET
BASOS PCT: 0.9 % (ref 0.0–3.0)
Basophils Absolute: 0.1 10*3/uL (ref 0.0–0.1)
EOS PCT: 5.1 % — AB (ref 0.0–5.0)
Eosinophils Absolute: 0.3 10*3/uL (ref 0.0–0.7)
HEMATOCRIT: 34.7 % — AB (ref 36.0–46.0)
HEMOGLOBIN: 11.3 g/dL — AB (ref 12.0–15.0)
LYMPHS PCT: 14.7 % (ref 12.0–46.0)
Lymphs Abs: 0.9 10*3/uL (ref 0.7–4.0)
MCHC: 32.4 g/dL (ref 30.0–36.0)
MCV: 99.2 fl (ref 78.0–100.0)
MONO ABS: 0.7 10*3/uL (ref 0.1–1.0)
Monocytes Relative: 11.8 % (ref 3.0–12.0)
Neutro Abs: 4.3 10*3/uL (ref 1.4–7.7)
Neutrophils Relative %: 67.5 % (ref 43.0–77.0)
Platelets: 143 10*3/uL — ABNORMAL LOW (ref 150.0–400.0)
RBC: 3.5 Mil/uL — ABNORMAL LOW (ref 3.87–5.11)
RDW: 14.4 % (ref 11.5–15.5)
WBC: 6.3 10*3/uL (ref 4.0–10.5)

## 2016-10-22 LAB — URIC ACID: Uric Acid, Serum: 7 mg/dL (ref 2.4–7.0)

## 2016-10-22 LAB — URINE CULTURE
MICRO NUMBER:: 81092306
SPECIMEN QUALITY: ADEQUATE

## 2016-10-22 LAB — HEMOGLOBIN A1C: Hgb A1c MFr Bld: 5.9 % (ref 4.6–6.5)

## 2016-10-26 DIAGNOSIS — R82998 Other abnormal findings in urine: Secondary | ICD-10-CM | POA: Insufficient documentation

## 2016-10-26 DIAGNOSIS — R519 Headache, unspecified: Secondary | ICD-10-CM | POA: Insufficient documentation

## 2016-10-26 DIAGNOSIS — R51 Headache: Secondary | ICD-10-CM

## 2016-10-26 NOTE — Assessment & Plan Note (Signed)
Encouraged to get adequate exercise, calcium and vitamin d intake 

## 2016-10-26 NOTE — Assessment & Plan Note (Signed)
Increased intensity and frequency of headahces recently. Headache typically occurs initially at occiput but then generalizes. She also has some weakness and dizziness at times. Will proceed with CT of head. Encouraged increased hydration, 64 ounces of clear fluids daily. Minimize alcohol and caffeine. Eat small frequent meals with lean proteins and complex carbs. Avoid high and low blood sugars. Get adequate sleep, 7-8 hours a night. Needs exercise daily preferably in the morning.

## 2016-10-26 NOTE — Assessment & Plan Note (Signed)
Avoid offending foods, start probiotics. Do not eat large meals in late evening and consider raising head of bed. Discussed at length how to manage her symptoms with the least meds and risk. Will try Ranitidine bid and if this works will stay off Pantoprazole. If she has significant symptoms will need to restart the Pantoprazole.

## 2016-10-26 NOTE — Assessment & Plan Note (Signed)
Is now established with nephrology and she is going for dialysis classes to learn about the what that might look like moving forward. Her creatinine is improved some today. Reminded to maintain adequate hydration and avoid OTC meds unless she discusses with [place].

## 2016-10-26 NOTE — Assessment & Plan Note (Signed)
Urine culture negative and patient feeling better, no changes

## 2016-10-26 NOTE — Assessment & Plan Note (Signed)
Well controlled, no changes to meds. Encouraged heart healthy diet such as the DASH diet and exercise as tolerated.  °

## 2016-10-28 ENCOUNTER — Other Ambulatory Visit: Payer: Self-pay | Admitting: Family Medicine

## 2016-10-28 ENCOUNTER — Telehealth: Payer: Self-pay | Admitting: Family Medicine

## 2016-10-28 DIAGNOSIS — N289 Disorder of kidney and ureter, unspecified: Secondary | ICD-10-CM

## 2016-10-28 NOTE — Telephone Encounter (Signed)
Please advise 

## 2016-10-28 NOTE — Telephone Encounter (Signed)
Sure we can put her in a referral to a nutritionist for a consultation on a renal diet. I will order just let her know

## 2016-10-28 NOTE — Telephone Encounter (Signed)
Relation to VT:VNRW Call back number:[phone] Pharmacy:  Reason for call:  Patient requesting referral to dietician who specializes In the "renal diet" , please advise

## 2016-10-29 ENCOUNTER — Telehealth: Payer: Self-pay | Admitting: Family Medicine

## 2016-10-29 MED ORDER — RANITIDINE HCL 150 MG PO TABS: 150.0000 mg | ORAL_TABLET | Freq: Two times a day (BID) | ORAL | 1 refills | Status: DC

## 2016-10-29 NOTE — Telephone Encounter (Signed)
Notified pt and she is agreeable to proceed with referral. 

## 2016-10-29 NOTE — Telephone Encounter (Signed)
Rx has been resent 

## 2016-10-29 NOTE — Telephone Encounter (Signed)
[place], [place] [phone] (Phone) [phone] (Fax)   Mail order states rx was never received for ranitidine (ZANTAC) 150 MG tablet,please advise

## 2016-11-04 ENCOUNTER — Other Ambulatory Visit (HOSPITAL_COMMUNITY): Payer: Self-pay

## 2016-11-05 ENCOUNTER — Ambulatory Visit (HOSPITAL_COMMUNITY)
Admission: RE | Admit: 2016-11-05 | Discharge: 2016-11-05 | Disposition: A | Discharge status: Home / Self Care | Payer: Medicare HMO | Source: Ambulatory Visit | Attending: Nephrology | Admitting: Nephrology

## 2016-11-05 DIAGNOSIS — D631 Anemia in chronic kidney disease: Secondary | ICD-10-CM | POA: Insufficient documentation

## 2016-11-05 DIAGNOSIS — N189 Chronic kidney disease, unspecified: Secondary | ICD-10-CM | POA: Diagnosis not present

## 2016-11-05 MED ORDER — SODIUM CHLORIDE 0.9 % IV SOLN
510.0000 mg | INTRAVENOUS | Status: DC
  Administered 2016-11-05: 10:00:00 510 mg via INTRAVENOUS
  Filled 2016-11-05: qty 17

## 2016-11-05 NOTE — Discharge Instructions (Signed)

## 2016-11-06 DIAGNOSIS — N184 Chronic kidney disease, stage 4 (severe): Secondary | ICD-10-CM | POA: Diagnosis not present

## 2016-11-09 ENCOUNTER — Other Ambulatory Visit: Payer: Self-pay | Admitting: Family Medicine

## 2016-11-10 NOTE — Telephone Encounter (Signed)
Requesting:tramadol Contract:yes UDS:low risk nxt scrn 09/21/16 Last OV:10/21/16 Next OV:12/23/16 Last Refill:09/12/16 [id number]   Please advise

## 2016-11-10 NOTE — Telephone Encounter (Signed)
rx faxed

## 2016-11-13 ENCOUNTER — Encounter (HOSPITAL_COMMUNITY)
Admission: RE | Admit: 2016-11-13 | Discharge: 2016-11-13 | Disposition: A | Discharge status: Home / Self Care | Payer: Medicare HMO | Source: Ambulatory Visit | Attending: Nephrology | Admitting: Nephrology

## 2016-11-13 DIAGNOSIS — N189 Chronic kidney disease, unspecified: Secondary | ICD-10-CM | POA: Insufficient documentation

## 2016-11-13 DIAGNOSIS — D631 Anemia in chronic kidney disease: Secondary | ICD-10-CM | POA: Diagnosis not present

## 2016-11-13 MED ORDER — SODIUM CHLORIDE 0.9 % IV SOLN
510.0000 mg | INTRAVENOUS | Status: AC
  Administered 2016-11-13: 10:00:00 510 mg via INTRAVENOUS
  Filled 2016-11-13: qty 17

## 2016-11-17 ENCOUNTER — Ambulatory Visit: Payer: Medicare HMO | Admitting: Family Medicine

## 2016-11-18 MED FILL — FUROSEMIDE 40 MG TAB: 40 | 50 days supply | Qty: 100 | Fill #1

## 2016-11-28 ENCOUNTER — Encounter: Payer: Medicare HMO | Attending: Family Medicine | Admitting: Dietician

## 2016-11-28 ENCOUNTER — Encounter: Payer: Self-pay | Admitting: Dietician

## 2016-11-28 DIAGNOSIS — N289 Disorder of kidney and ureter, unspecified: Secondary | ICD-10-CM | POA: Insufficient documentation

## 2016-11-28 DIAGNOSIS — E559 Vitamin D deficiency, unspecified: Secondary | ICD-10-CM | POA: Insufficient documentation

## 2016-11-28 DIAGNOSIS — K219 Gastro-esophageal reflux disease without esophagitis: Secondary | ICD-10-CM | POA: Diagnosis not present

## 2016-11-28 DIAGNOSIS — E785 Hyperlipidemia, unspecified: Secondary | ICD-10-CM | POA: Diagnosis not present

## 2016-11-28 DIAGNOSIS — N183 Chronic kidney disease, stage 3 unspecified: Secondary | ICD-10-CM

## 2016-11-28 DIAGNOSIS — I10 Essential (primary) hypertension: Secondary | ICD-10-CM | POA: Insufficient documentation

## 2016-11-28 NOTE — Progress Notes (Signed)
  Medical Nutrition Therapy:  Appt start time: 3976 end time:  1430.   Assessment:  Primary concerns today: Patient is here today alone.  She would like to learn what to eat related to her worsoning kidney function.  She wears oxygen.  History includes:  GERD, hyperlipidemia, HTN, vitamin D deficiency. Labs include:  10/21/16 BUN 44, Creatinine 1.61, Potassium 4.8, GFR 33, A1C 5.9%. Weight 199 lbs today decreased from 204 lbs 10/21/16.  Patient lives alone.  She drives and does her own shopping and cooking.. She also eats out.  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   Preferred Learning Style:   No preference indicated   Learning Readiness:   Ready   MEDICATIONS: see list to include vitamin B-12, vitamin D, probiotic, niacin, B complex, potassium, iron and folic acid   DIETARY INTAKE:  "Sweets are my favorite but I have had to cut them out" 24-hr recall:  B ( AM): instant oatmeal, almond milk, coffee with cream OR honey nut cheerios with almond milk, occasional banana Snk ( AM): clementine  L ( PM): Sandwich, chips, fruit OR canned chicken with mayo on bread OR cobb salad with grilled chicken Snk ( PM): cookie D ( PM): yogurt OR oatmeal OR chicken sandwich OR banana OR peanut butter Snk ( PM): none Beverages: Dr. Malachi Bonds, coffee with creamer, sweet tea, water, apple juice  Usual physical activity: none (used to do water aerobics prior to oxygen)  Estimated energy needs: 1800 calories 50 g protein  Progress Towards Goal(s):  In progress.   Nutritional Diagnosis:  NB-1.1 Food and nutrition-related knowledge deficit As related to renal diet.  As evidenced by patient report.    Intervention:  Nutrition education related to a low sodium, low phosphorous diet.  Label reading discussed as well as eating out.  Discussed the Calorie [employer] app and other resources to determine the amount of sodium in foods eaten out.  Discussed snack options.  Follow a low sodium diet.  Avoid  added salt  Choose fresh, frozen without salt, or canned without salt (or rinse) vegetables  Avoid processed meat.  (ham, bacon, sausage, lunch meat)- you can find LS Boar's head [place] at the deli, LS bacon)-  Consider using leftover chicken for sandwiches.  Read labels for sodium.  Be aware of foods when eating out.   Use the Calorie [employer] on your iphone.   Google the restaurant and nutritional to find sodium content.   Ask for your food to be prepared without salt.   Follow a low phosphorous diet  Avoid dark soda  Avoid bottled tea or other foods that use phosphorous as a preservative.  No need for a potassium restriction at this time as you are on a potassium supplement.   Stay hydrated.   Teaching Method Utilized:  Visual Auditory  Handouts given during visit include:  Kidney diet pyramid from [employer].  Label reading  Nutritional information at [employer] with discussion of errors depending on the cook.  Barriers to learning/adherence to lifestyle change: health  Demonstrated degree of understanding via:  Teach Back   Monitoring/Evaluation:  Dietary intake, exercise, label reading, and body weight prn.

## 2016-11-28 NOTE — Patient Instructions (Signed)
Follow a low sodium diet.  Avoid added salt  Choose fresh, frozen without salt, or canned without salt (or rinse) vegetables  Avoid processed meat.  (ham, bacon, sausage, lunch meat)- you can find LS Boar's head [place] at the deli, LS bacon)-  Consider using leftover chicken for sandwiches.  Read labels for sodium.  Be aware of foods when eating out.   Use the Calorie [employer] on your iphone.   Google the restaurant and nutritional to find sodium content.   Ask for your food to be prepared without salt.   Follow a low phosphorous diet  Avoid dark soda  Avoid bottled tea or other foods that use phosphorous as a preservative.  No need for a potassium restriction at this time as you are on a potassium supplement.   Stay hydrated.

## 2016-12-15 ENCOUNTER — Other Ambulatory Visit: Payer: Self-pay | Admitting: Medical

## 2016-12-15 ENCOUNTER — Other Ambulatory Visit: Payer: Self-pay | Admitting: Family Medicine

## 2016-12-15 NOTE — Telephone Encounter (Signed)
OK to refill but needs UDS updated

## 2016-12-15 NOTE — Telephone Encounter (Signed)
Requesting:tramadol Contract:yes UDS:low risk next screen 09/21/16 Last OV:10/21/16 Next OV:12/23/16 Last Refill:11/10/16  #[id number]   Please advise  '

## 2016-12-23 ENCOUNTER — Encounter: Payer: Self-pay | Admitting: Family Medicine

## 2016-12-23 ENCOUNTER — Ambulatory Visit: Payer: Medicare HMO | Admitting: Family Medicine

## 2016-12-23 VITALS — BP 124/70 | HR 78 | Temp 97.6°F | Resp 18 | Wt 192.0 lb

## 2016-12-23 DIAGNOSIS — N289 Disorder of kidney and ureter, unspecified: Secondary | ICD-10-CM | POA: Diagnosis not present

## 2016-12-23 DIAGNOSIS — M549 Dorsalgia, unspecified: Secondary | ICD-10-CM

## 2016-12-23 DIAGNOSIS — E118 Type 2 diabetes mellitus with unspecified complications: Secondary | ICD-10-CM | POA: Diagnosis not present

## 2016-12-23 DIAGNOSIS — E870 Hyperosmolality and hypernatremia: Secondary | ICD-10-CM | POA: Diagnosis not present

## 2016-12-23 DIAGNOSIS — E782 Mixed hyperlipidemia: Secondary | ICD-10-CM | POA: Diagnosis not present

## 2016-12-23 DIAGNOSIS — J42 Unspecified chronic bronchitis: Secondary | ICD-10-CM | POA: Diagnosis not present

## 2016-12-23 DIAGNOSIS — N183 Chronic kidney disease, stage 3 unspecified: Secondary | ICD-10-CM

## 2016-12-23 DIAGNOSIS — I10 Essential (primary) hypertension: Secondary | ICD-10-CM | POA: Diagnosis not present

## 2016-12-23 DIAGNOSIS — R000 Tachycardia, unspecified: Secondary | ICD-10-CM | POA: Diagnosis not present

## 2016-12-23 DIAGNOSIS — Z79899 Other long term (current) drug therapy: Secondary | ICD-10-CM

## 2016-12-23 MED ORDER — PRAVASTATIN SODIUM 10 MG PO TABS: 10.0000 mg | ORAL_TABLET | Freq: Every day | ORAL | 1 refills | Status: DC

## 2016-12-23 NOTE — Patient Instructions (Addendum)
BRAT diet (bananas, rice, applesauce and toast) for 24 hours  Start Flasxeed Oil capsules daily Chronic Kidney Disease, Adult Chronic kidney disease (CKD) occurs when the kidneys are damaged during a period of 3 or more months. The kidneys are two organs that do many important jobs in the body, which include:  Removing wastes and extra fluids from the blood.  Making hormones that maintain the amount of fluid in your tissues and blood vessels.  Maintaining the right amount of fluids and chemicals in the body.  A small amount of kidney damage may not cause problems, but a large amount of damage may make it difficult or impossible for the kidneys to work the way they should. If steps are not taken to slow down the kidney damage or stop it from getting worse, the kidneys may stop working permanently (end stage kidney disease). Most of the time, CKD does not go away, but it can often be controlled. People who have CKD can usually live normal lives. What are the causes? The most common causes of this condition are diabetes and high blood pressure (hypertension). Other causes include:  Heart and blood vessel (cardiovascular) disease.  Kidney diseases: ? Glomerulonephritis. ? Interstitial nephritis. ? Polycystic kidney disease. ? Renal vascular disease.  Diseases that affect the immune system.  Genetic diseases.  Medicines that damage the kidneys, such as anti-inflammatory medicines.  Poisoning.  Being around or in contact with poisonous (toxic) substances.  A kidney or urinary infection that occurs again (recurs).  Vasculitis.  Repeat kidney infections.  A problem with urine flow that may be caused by: ? Cancer. ? Having kidney stones more than one time. ? An enlarged prostate in males.  What increases the risk? This condition is more likely to develop in people who are:  Older than age 21.  Female.  Of African-American descent.  Current smokers or former  smokers.  Obese.  You may also have an increased risk for CKD if you have a family history of CKDor youfrequently take medicines that are damaging to the kidneys. What are the signs or symptoms? Symptoms develop slowly and may not be obvious until the kidney damage becomes severe. It is possible to have a kidney disease for years without showing any symptoms. Symptoms of this condition can include:  Swelling (edema) of the face, legs, ankles, or feet.  Numbness, tingling, or loss of feeling (sensation) in the hands or feet.  Tiredness (lethargy).  Nausea or vomiting.  Confusion or trouble concentrating.  Problems with urination, such as: ? Painful or burning feeling during urination. ? Decreased urine production. ? Frequent urination, especially at night. ? Bloody urine.  Muscle twitches and cramps, especially in the legs.  Shortness of breath.  Weakness.  Constant itchiness.  Loss of appetite.  Metallic taste in the mouth.  Trouble sleeping.  Pale lining of the eyelids and surface of the eye (conjunctiva).  How is this diagnosed? This condition may be diagnosed with various tests. Tests may include:  Blood tests.  Urine tests.  Imaging tests.  A test in which a sample of tissue is removed from the kidneys to be looked at under a microscope (kidney biopsy).  These test results will help your health care provider determine what class of CKD you have. How is this treated? Most cases of CKD cannot be cured. Treatment usually involves relieving symptoms and preventing or slowing the progression of the disease. Treatment may include:  A special diet, which may require you to  avoid alcohol, salty foods (sodium), and foods that are high in potassium, calcium, and protein.  Medicines: ? To lower blood pressure. ? To relieve low blood count (anemia). ? To relieve swelling. ? To protect your bones. ? To improve the balance of electrolytes in your  blood.  Removing toxic waste from the body by using hemodialysis or peritoneal dialysis if the kidneys can no longer do their job (kidney failure).  Management of other conditions that are causing your CKD or making it worse.  Follow these instructions at home:  Follow your prescribed diet.  Take over-the-counter and prescription medicines only as told by your health care provider. ? Do not take any new medicines unless approved by your health care provider. Many medicines can worsen your kidney damage. ? Do not take any vitamin and mineral supplements unless approved by your health care provider. Many nutritional supplements can worsen your kidney damage. ? The dose of some medicines that you take may need to be adjusted.  Do not use any tobacco products, such as cigarettes, chewing tobacco, and e-cigarettes. If you need help quitting, ask your health care provider.  Keep all follow-up visits as told by your health care provider. This is important.  Keep track of your blood pressure. Report changes in your blood pressure as told by your health care provider.  Achieve and maintain a healthy weight. If you need help with this, ask your health care provider.  Start or continue an exercise plan. Try to exercise at least 30 minutes a day, 5 days a week.  Stay current with immunizations as told by your health care provider. Where to find more information:  American Association of Kidney Patients: BombTimer.gl  National Kidney Foundation: www.kidney.[place]: https://mathis.com/  [facility]: www.lifeoptions.org and www.kidneyschool.org Contact a health care provider if:  Your symptoms get worse.  You develop new symptoms. Get help right away if:  You develop symptoms of end-stage kidney disease, which include: ? Headaches. ? Abnormally dark or light skin. ? Numbness in the hands or feet. ? Easy bruising. ? Frequent hiccups. ? Chest  pain. ? Shortness of breath. ? End of menstruation in women.  You have a fever.  You have decreased urine production.  You have pain or bleeding when you urinate. This information is not intended to replace advice given to you by your health care provider. Make sure you discuss any questions you have with your health care provider. Document Released: 10/16/2007 Document Revised: 06/14/2015 Document Reviewed: 09/05/2011 Elsevier Interactive Patient Education  2017 [employer].

## 2016-12-23 NOTE — Assessment & Plan Note (Signed)
Encouraged heart healthy diet, increase exercise, avoid trans fats, consider a krill oil cap daily 

## 2016-12-23 NOTE — Assessment & Plan Note (Signed)
RRR 

## 2016-12-23 NOTE — Assessment & Plan Note (Addendum)
Stable and doing well follows with nephrology

## 2016-12-23 NOTE — Assessment & Plan Note (Signed)
Well controlled, no changes to meds. Encouraged heart healthy diet such as the DASH diet and exercise as tolerated.  °

## 2016-12-23 NOTE — Progress Notes (Signed)
Subjective:  I acted as a Education administrator for Dr. Charlett Blake. [place], [place]  Patient ID: [name], female    DOB: Jun 13, 1940, 76 y.o.   MRN: [id number]  No chief complaint on file.   HPI  Patient is in today for a follow up and overall is doing well. She is sad this time of year since being widowed but she has good family support and is managing fairly well all things considered. No recent hospitalizations but she is struggling with persistent pedal edema as well as respiratory concerns. Continues to follow closely with pulmonology and notes some SOB and cough recently. Denies CP/palp/SOB/HA/congestion/fevers/GI or GU c/o. Taking meds as prescribed  Patient Care Team: Mosie Lukes, MD as PCP - General (Family Medicine) Elsie Stain, MD as Consulting Physician (Pulmonary Disease) Shirley Muscat Loreen Freud, MD as Referring Physician (Optometry) Parrett, Fonnie Mu, NP as Nurse Practitioner (Pulmonary Disease) Bo Merino, MD as Consulting Physician (Rheumatology)   Past Medical History:  Diagnosis Date  . Abnormal glucose tolerance test   . Abnormal TSH 09/22/2016  . ACE-inhibitor cough   . Anemia 03/12/2014  . Arthritis   . Asthma    PFT 02/06/09 FEV1 1.41 (65%), FVC 1.92 (64), FEV1% 74, TLC 3.47 (71%), DLCO 48%, +BD  . Atypical chest pain    s/p cath, Normal coronaries, Non ST elevation myocardial infarction, Rt groin pseudoaneurysm  . Bacterial vaginosis 03/12/2014  . Cellulitis 06/22/2016  . Chronic kidney disease (CKD), stage III (moderate) ([place]) 08/04/2016  . COPD (chronic obstructive pulmonary disease) ([place])   . Depression   . Diastolic heart failure ([place]) 04/07/2016  . DVT (deep venous thrombosis) ([place]) 1987  . Fall at home 12/04/2014  . GERD (gastroesophageal reflux disease)   . Gout   . Hypercalcemia 10/15/2014  . Hyperlipidemia   . Hypertension   . Hypoxia 10/15/2014  . Macular degeneration 04/10/2015  . Osteopenia 12/29/2006   Qualifier: Diagnosis of  By: Wynona Luna     . Pneumonia   . 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 Renal insufficiency 09/22/2016  . Sun-damaged skin 10/24/2012  . Unspecified constipation 06/05/2013  . Urinary frequency 10/24/2012  . UTI (lower urinary tract infection) 10/24/2012  . Vitamin D deficiency 01/01/2015    Past Surgical History:  Procedure Laterality Date  . APPENDECTOMY  1951  . TONSILLECTOMY  1950  . TUBAL LIGATION  1968    Family History  Problem Relation Age of Onset  . Asthma Sister   . Arthritis Sister   . Hypertension Sister   . Hyperlipidemia Sister   . Uterine cancer Sister   . Coronary artery disease Brother        x2  . Arthritis Brother   . Lung cancer Brother        smoker  . Hypertension Sister   . Arthritis Sister   . Hyperlipidemia Sister   . Emphysema Sister   . COPD Sister        smoker  . Heart disease Sister   . Hyperlipidemia Sister   . Hypertension Sister   . Arthritis Sister   . Emphysema Brother   . Heart disease Brother         smoker  . Heart attack Brother   . Mental illness Father   . Alcohol abuse Father   . Suicidality Father   . Heart disease Mother   . Hyperlipidemia Mother   . Heart attack Mother   . Epilepsy Daughter   .  Hypertension Daughter   . Obesity Daughter   . COPD Brother        smoker  . Lung cancer Brother   . Coronary artery disease Other   . Colon polyps Sister     Social History   Socioeconomic History  . Marital status: Widowed    Spouse name: Not on file  . Number of children: 1  . Years of education: Not on file  . Highest education level: Not on file  Social Needs  . Financial resource strain: Not on file  . Food insecurity - worry: Not on file  . Food insecurity - inability: Not on file  . Transportation needs - medical: Not on file  . Transportation needs - non-medical: Not on file  Occupational History  . Occupation: Retired    Fish farm manager: [employer]  Tobacco Use  . Smoking status: Never Smoker  . Smokeless tobacco: Never  Used  Substance and Sexual Activity  . Alcohol use: No  . Drug use: No  . Sexual activity: No    Comment: lives alone, no dietary restrictions  Other Topics Concern  . Not on file  Social History Narrative  . Not on file    Outpatient Medications Prior to Visit  Medication Sig Dispense Refill  . acetaminophen (TYLENOL) 500 MG tablet Take 500 mg by mouth every 6 (six) hours as needed.    Marland Kitchen albuterol (VENTOLIN HFA) 108 (90 Base) MCG/ACT inhaler Inhale 2 puffs into the lungs 4 (four) times daily as needed for wheezing or shortness of breath. 3 Inhaler 0  . allopurinol (ZYLOPRIM) 100 MG tablet TAKE 2 TABLETS ONE TIME DAILY 180 tablet 1  . aspirin EC 81 MG tablet Take 1 tablet (81 mg total) by mouth daily.    Marland Kitchen b complex vitamins tablet Take 1 tablet by mouth daily.    . budesonide-formoterol (SYMBICORT) 160-4.5 MCG/ACT inhaler INHALE 2 PUFFS INTO THE LUNGS 2 TIMES DAILY 3 Inhaler 1  . cholecalciferol (VITAMIN D) 1000 units tablet Take 1,000 Units daily by mouth.    . Cyanocobalamin (VITAMIN B-12 PO) Take by mouth daily.    . Ferrous Fumarate-Folic Acid (HEMOCYTE-F) 324-1 MG TABS Take 1 tablet by mouth daily. 90 each 1  . fluticasone (FLONASE) 50 MCG/ACT nasal spray Place 1 spray into both nostrils daily. 16 g 3  . furosemide (LASIX) 40 MG tablet 1 tab po daily and a 2nd tab po daily prn increased edema, wt gain>3 #/24 hour 100 tablet 5  . glucose blood (ONE TOUCH TEST STRIPS) test strip Use as instructed to check blood sugar once weekly. DX 790.29     . loratadine (CLARITIN) 10 MG tablet Take 1 tablet (10 mg total) by mouth at bedtime. 30 tablet 5  . metolazone (ZAROXOLYN) 2.5 MG tablet 1 tab po daily 1/2 hour prior to Lasix prn pedal edema, increasing SOB, weight gain >3# 20 tablet 1  . metoprolol succinate (TOPROL-XL) 50 MG 24 hr tablet Take 75 mg (1.5 tablets) daily. 135 tablet 3  . montelukast (SINGULAIR) 10 MG tablet Take 1 tablet (10 mg total) by mouth at bedtime as needed. 30 tablet 3   . Niacin (VITAMIN B-3 PO) Take 1 tablet by mouth daily.    . OXYGEN Inhale into the lungs.    . potassium chloride SA (K-DUR,KLOR-CON) 20 MEQ tablet Take 1 tablet (20 mEq total) by mouth daily. And a 2nd tab po daily prn extra Furosemide tab 100 tablet 5  . Probiotic Product (PROBIOTIC PO)  Take by mouth daily.    . ranitidine (ZANTAC) 150 MG tablet Take 1 tablet (150 mg total) by mouth 2 (two) times daily. 180 tablet 1  . sertraline (ZOLOFT) 100 MG tablet TAKE 1 TABLET (100 MG TOTAL) BY MOUTH DAILY. 90 tablet 3  . SPIRIVA HANDIHALER 18 MCG inhalation capsule PLACE 1 CAPSULE (18 MCG TOTAL) INTO INHALER AND INHALE CONTENTS ONE TIME DAILY. 90 capsule 1  . traMADol (ULTRAM) 50 MG tablet TAKE 1 TABLET BY MOUTH THREE TIMES DAILY AS NEEDED 90 tablet 0  . pravastatin (PRAVACHOL) 20 MG tablet TAKE 1 TABLET EVERY DAY 90 tablet 1   No facility-administered medications prior to visit.     Allergies  Allergen Reactions  . Oseltamivir Phosphate     TAMIFLU REACTION: nausea, vomiting, diarrhea, dizziness  . Sulfa Antibiotics Other (See Comments)    CP  . Ace Inhibitors   . Indomethacin   . Montelukast Sodium     REACTION: Heart palpitations, chest pain  . Sulfonamide Derivatives     Review of Systems  Constitutional: Positive for malaise/fatigue. Negative for fever.  HENT: Negative for congestion.   Eyes: Negative for blurred vision.  Respiratory: Positive for cough and shortness of breath.   Cardiovascular: Negative for chest pain, palpitations and leg swelling.  Gastrointestinal: Negative for vomiting.  Musculoskeletal: Positive for back pain.  Skin: Negative for rash.  Neurological: Negative for loss of consciousness and headaches.       Objective:    Physical Exam  Constitutional: She is oriented to person, place, and time. She appears well-developed and well-nourished. No distress.  HENT:  Head: Normocephalic and atraumatic.  Eyes: Conjunctivae are normal.  Neck: Normal range of  motion. No thyromegaly present.  Cardiovascular: Normal rate and regular rhythm.  Pulmonary/Chest: Effort normal and breath sounds normal. She has no wheezes.  Abdominal: Soft. Bowel sounds are normal. There is no tenderness.  Musculoskeletal: Normal range of motion. She exhibits edema. She exhibits no deformity.  Scab noted on b/l tibial plateuas no sign of infection or redness  Neurological: She is alert and oriented to person, place, and time.  Skin: Skin is warm and dry. She is not diaphoretic.  Psychiatric: She has a normal mood and affect.    BP 124/70 (BP Location: Left Arm, Patient Position: Sitting, Cuff Size: Normal)   Pulse 78   Temp 97.6 F (36.4 C) (Oral)   Resp 18   Wt 192 lb (87.1 kg)   SpO2 98%   BMI 31.95 kg/m  Wt Readings from Last 3 Encounters:  12/23/16 192 lb (87.1 kg)  11/28/16 199 lb (90.3 kg)  10/21/16 204 lb 12.8 oz (92.9 kg)   BP Readings from Last 3 Encounters:  12/23/16 124/70  11/13/16 (!) 114/44  11/05/16 (!) 110/40     Immunization History  Administered Date(s) Administered  . Influenza Split 12/08/2011  . Influenza Whole 10/20/2006, 11/09/2007, 11/14/2008, 10/30/2009  . Influenza, High Dose Seasonal PF 09/25/2015, 10/21/2015  . Influenza,inj,Quad PF,6+ Mos 10/20/2012, 11/28/2013, 10/02/2014, 09/18/2016  . Pneumococcal Conjugate-13 02/21/2013  . Pneumococcal Polysaccharide-23 06/30/2006  . Td 06/01/2007    Health Maintenance  Topic Date Due  . URINE MICROALBUMIN  03/01/2015  . OPHTHALMOLOGY EXAM  10/08/2015  . FOOT EXAM  04/09/2016  . HEMOGLOBIN A1C  04/21/2017  . TETANUS/TDAP  05/31/2017  . COLONOSCOPY  11/10/2018  . INFLUENZA VACCINE  Completed  . DEXA SCAN  Completed  . PNA vac Low Risk Adult  Completed  Lab Results  Component Value Date   WBC 6.3 10/21/2016   HGB 11.3 (L) 10/21/2016   HCT 34.7 (L) 10/21/2016   PLT 143.0 (L) 10/21/2016   GLUCOSE 87 10/21/2016   CHOL 144 10/21/2016   TRIG 132.0 10/21/2016   HDL 46.80  10/21/2016   LDLDIRECT 142.7 12/16/2005   LDLCALC 71 10/21/2016   ALT 11 10/21/2016   AST 15 10/21/2016   NA 140 10/21/2016   K 4.8 10/21/2016   CL 101 10/21/2016   CREATININE 1.61 (H) 10/21/2016   BUN 44 (H) 10/21/2016   CO2 32 10/21/2016   TSH 4.73 (H) 08/18/2016   INR 1.0 09/12/2006   HGBA1C 5.9 10/21/2016   MICROALBUR 0.8 02/28/2014    Lab Results  Component Value Date   TSH 4.73 (H) 08/18/2016   Lab Results  Component Value Date   WBC 6.3 10/21/2016   HGB 11.3 (L) 10/21/2016   HCT 34.7 (L) 10/21/2016   MCV 99.2 10/21/2016   PLT 143.0 (L) 10/21/2016   Lab Results  Component Value Date   NA 140 10/21/2016   K 4.8 10/21/2016   CO2 32 10/21/2016   GLUCOSE 87 10/21/2016   BUN 44 (H) 10/21/2016   CREATININE 1.61 (H) 10/21/2016   BILITOT 0.7 10/21/2016   ALKPHOS 48 10/21/2016   AST 15 10/21/2016   ALT 11 10/21/2016   PROT 7.1 10/21/2016   ALBUMIN 4.1 10/21/2016   CALCIUM 10.0 10/21/2016   ANIONGAP 7 02/14/2016   GFR 33.11 (L) 10/21/2016   Lab Results  Component Value Date   CHOL 144 10/21/2016   Lab Results  Component Value Date   HDL 46.80 10/21/2016   Lab Results  Component Value Date   LDLCALC 71 10/21/2016   Lab Results  Component Value Date   TRIG 132.0 10/21/2016   Lab Results  Component Value Date   CHOLHDL 3 10/21/2016   Lab Results  Component Value Date   HGBA1C 5.9 10/21/2016         Assessment & Plan:   Problem List Items Addressed This Visit    Essential hypertension    Well controlled, no changes to meds. Encouraged heart healthy diet such as the DASH diet and exercise as tolerated.       Relevant Medications   pravastatin (PRAVACHOL) 10 MG tablet   Diabetes type 2, controlled (HCC)    hgba1c acceptable, minimize simple carbs. Increase exercise as tolerated. Continue current meds      Relevant Medications   pravastatin (PRAVACHOL) 10 MG tablet   Hyperlipidemia, mixed    Encouraged heart healthy diet, increase  exercise, avoid trans fats, consider a krill oil cap daily      Relevant Medications   pravastatin (PRAVACHOL) 10 MG tablet   Back pain    Improving with weight loss, tramadol helpful without side effects. May continue prn use.       Tachycardia    RRR      Chronic bronchitis (HCC)    Doing well on O2 via [place]      Chronic kidney disease (CKD), stage III (moderate) (HCC)    Following with nephrology      Renal insufficiency    Stable and doing well follows with nephrology      Hypernatremia    New, asymptomatic, increase hydration and recheck cmp       Other Visit Diagnoses    High risk medication use    -  Primary   Relevant Orders  Pain Mgmt, Profile 8 w/Conf, U      I have discontinued Cortlynn Hollinsworth. Ellingsen's pravastatin. I am also having her start on pravastatin. Additionally, I am having her maintain her glucose blood, acetaminophen, Niacin (VITAMIN B-3 PO), Cyanocobalamin (VITAMIN B-12 PO), OXYGEN, b complex vitamins, Probiotic Product (PROBIOTIC PO), aspirin EC, montelukast, SPIRIVA HANDIHALER, loratadine, fluticasone, metolazone, sertraline, metoprolol succinate, budesonide-formoterol, albuterol, potassium chloride SA, Ferrous Fumarate-Folic Acid, furosemide, allopurinol, ranitidine, cholecalciferol, and traMADol.  Meds ordered this encounter  Medications  . pravastatin (PRAVACHOL) 10 MG tablet    Sig: Take 1 tablet (10 mg total) by mouth daily.    Dispense:  90 tablet    Refill:  1    CMA served as Education administrator during this visit. History, Physical and Plan performed by medical provider. Documentation and orders reviewed and attested to.  Penni Homans, MD

## 2016-12-23 NOTE — Assessment & Plan Note (Signed)
Improving with weight loss, tramadol helpful without side effects. May continue prn use.

## 2016-12-23 NOTE — Assessment & Plan Note (Signed)
Following with nephrology

## 2016-12-23 NOTE — Assessment & Plan Note (Signed)
hgba1c acceptable, minimize simple carbs. Increase exercise as tolerated. Continue current meds 

## 2016-12-23 NOTE — Assessment & Plan Note (Signed)
Doing well on O2 via [place]

## 2016-12-25 DIAGNOSIS — E870 Hyperosmolality and hypernatremia: Secondary | ICD-10-CM | POA: Insufficient documentation

## 2016-12-25 NOTE — Assessment & Plan Note (Signed)
New, asymptomatic, increase hydration and recheck cmp

## 2016-12-26 LAB — PAIN MGMT, PROFILE 8 W/CONF, U
6 ACETYLMORPHINE: NEGATIVE ng/mL (ref <10)
ALCOHOL METABOLITES: NEGATIVE ng/mL (ref <500)
ALPHAHYDROXYMIDAZOLAM: NEGATIVE ng/mL (ref <50)
ALPHAHYDROXYTRIAZOLAM: NEGATIVE ng/mL (ref <50)
Alphahydroxyalprazolam: NEGATIVE ng/mL (ref <25)
Aminoclonazepam: NEGATIVE ng/mL (ref <25)
Amphetamines: NEGATIVE ng/mL (ref <500)
Benzodiazepines: NEGATIVE ng/mL (ref <100)
Buprenorphine, Urine: NEGATIVE ng/mL (ref <5)
COCAINE METABOLITE: NEGATIVE ng/mL (ref <150)
Creatinine: 107 mg/dL
HYDROXYETHYLFLURAZEPAM: NEGATIVE ng/mL (ref <50)
LORAZEPAM: NEGATIVE ng/mL (ref <50)
MARIJUANA METABOLITE: NEGATIVE ng/mL (ref <20)
MDMA: NEGATIVE ng/mL (ref <500)
NORDIAZEPAM: NEGATIVE ng/mL (ref <50)
OPIATES: NEGATIVE ng/mL (ref <100)
OXAZEPAM: NEGATIVE ng/mL (ref <50)
OXYCODONE: NEGATIVE ng/mL (ref <100)
Oxidant: NEGATIVE ug/mL (ref <200)
Temazepam: NEGATIVE ng/mL (ref <50)
pH: 5.51 (ref 4.5–9.0)

## 2017-01-08 DIAGNOSIS — E1122 Type 2 diabetes mellitus with diabetic chronic kidney disease: Secondary | ICD-10-CM | POA: Diagnosis not present

## 2017-01-08 DIAGNOSIS — I739 Peripheral vascular disease, unspecified: Secondary | ICD-10-CM | POA: Diagnosis not present

## 2017-01-08 DIAGNOSIS — N184 Chronic kidney disease, stage 4 (severe): Secondary | ICD-10-CM | POA: Diagnosis not present

## 2017-01-08 DIAGNOSIS — I129 Hypertensive chronic kidney disease with stage 1 through stage 4 chronic kidney disease, or unspecified chronic kidney disease: Secondary | ICD-10-CM | POA: Diagnosis not present

## 2017-01-08 DIAGNOSIS — R809 Proteinuria, unspecified: Secondary | ICD-10-CM | POA: Diagnosis not present

## 2017-01-08 DIAGNOSIS — I5030 Unspecified diastolic (congestive) heart failure: Secondary | ICD-10-CM | POA: Diagnosis not present

## 2017-01-08 DIAGNOSIS — Z6834 Body mass index (BMI) 34.0-34.9, adult: Secondary | ICD-10-CM | POA: Diagnosis not present

## 2017-01-08 DIAGNOSIS — D631 Anemia in chronic kidney disease: Secondary | ICD-10-CM | POA: Diagnosis not present

## 2017-01-16 ENCOUNTER — Other Ambulatory Visit: Payer: Self-pay | Admitting: Family Medicine

## 2017-02-02 NOTE — Progress Notes (Signed)
Subjective:   [name] is a 77 y.o. female who presents for Medicare Annual (Subsequent) preventive examination. Pt presents on 2L of 02 via [place].  Review of Systems:  No ROS.  Medicare Wellness Visit. Additional risk factors are reflected in the social history. Cardiac Risk Factors include: advanced age (>59men, >56 women);diabetes mellitus;dyslipidemia;hypertension;sedentary lifestyle Sleep patterns: Sleeps with 2L/ 02 via [place]. Sleeps at least 8hrs. Naps daily. Home Safety/Smoke Alarms: Feels safe in home. Smoke alarms in place. Lives in 1 story home. Family lives very close.  Female:   Pap- last 02/28/14.        Mammo- Last 02/20/15> BI-RADS CATEGORY  1: Negative.  ORDERED TODAY.     Dexa scan- last 07/04/14. No result on file. ORDERED TODAY       CCS- last 11/09/13: Recall 5 yrs    Objective:     Vitals: There were no vitals taken for this visit.  There is no height or weight on file to calculate BMI.  Advanced Directives 02/05/2017 11/28/2016 02/14/2016 01/07/2016 12/11/2014 11/21/2014 05/14/2014  Does Patient Have a Medical Advance Directive? No No No No No No No  Would patient like information on creating a medical advance directive? No - Patient declined No - Patient declined - Yes (MAU/Ambulatory/Procedural Areas - Information given) No - patient declined information No - patient declined information No - patient declined information  Pre-existing out of facility DNR order (yellow form or pink MOST form) - - - - - - -    Tobacco Social History   Tobacco Use  Smoking Status Never Smoker  Smokeless Tobacco Never Used     Counseling given: Not Answered   Clinical Intake: Pain : No/denies pain   Past Medical History:  Diagnosis Date  . Abnormal glucose tolerance test   . Abnormal TSH 09/22/2016  . ACE-inhibitor cough   . Anemia 03/12/2014  . Arthritis   . Asthma    PFT 02/06/09 FEV1 1.41 (65%), FVC 1.92 (64), FEV1% 74, TLC 3.47 (71%), DLCO 48%, +BD  . Atypical chest  pain    s/p cath, Normal coronaries, Non ST elevation myocardial infarction, Rt groin pseudoaneurysm  . Bacterial vaginosis 03/12/2014  . Cellulitis 06/22/2016  . Chronic kidney disease (CKD), stage III (moderate) ([place]) 08/04/2016  . COPD (chronic obstructive pulmonary disease) ([place])   . Depression   . Diastolic heart failure ([place]) 04/07/2016  . DVT (deep venous thrombosis) ([place]) 1987  . Fall at home 12/04/2014  . GERD (gastroesophageal reflux disease)   . Gout   . Hypercalcemia 10/15/2014  . Hyperlipidemia   . Hypertension   . Hypoxia 10/15/2014  . Macular degeneration 04/10/2015  . Osteopenia 12/29/2006   Qualifier: Diagnosis of  By: Wynona Luna   . Pneumonia   . 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 Renal insufficiency 09/22/2016  . Sun-damaged skin 10/24/2012  . Unspecified constipation 06/05/2013  . Urinary frequency 10/24/2012  . UTI (lower urinary tract infection) 10/24/2012  . Vitamin D deficiency 01/01/2015   Past Surgical History:  Procedure Laterality Date  . APPENDECTOMY  1951  . TONSILLECTOMY  1950  . TUBAL LIGATION  1968   Family History  Problem Relation Age of Onset  . Asthma Sister   . Arthritis Sister   . Hypertension Sister   . Hyperlipidemia Sister   . Uterine cancer Sister   . Coronary artery disease Brother        x2  . Arthritis Brother   . Lung  cancer Brother        smoker  . Hypertension Sister   . Arthritis Sister   . Hyperlipidemia Sister   . Emphysema Sister   . COPD Sister        smoker  . Heart disease Sister   . Hyperlipidemia Sister   . Hypertension Sister   . Arthritis Sister   . Emphysema Brother   . Heart disease Brother         smoker  . Heart attack Brother   . Mental illness Father   . Alcohol abuse Father   . Suicidality Father   . Heart disease Mother   . Hyperlipidemia Mother   . Heart attack Mother   . Epilepsy Daughter   . Hypertension Daughter   . Obesity Daughter   . COPD Brother        smoker  . Lung  cancer Brother   . Coronary artery disease Other   . Colon polyps Sister    Social History   Socioeconomic History  . Marital status: Widowed    Spouse name: None  . Number of children: 1  . Years of education: None  . Highest education level: None  Social Needs  . Financial resource strain: None  . Food insecurity - worry: None  . Food insecurity - inability: None  . Transportation needs - medical: None  . Transportation needs - non-medical: None  Occupational History  . Occupation: Retired    Fish farm manager: [employer]  Tobacco Use  . Smoking status: Never Smoker  . Smokeless tobacco: Never Used  Substance and Sexual Activity  . Alcohol use: No  . Drug use: No  . Sexual activity: No    Comment: lives alone, no dietary restrictions  Other Topics Concern  . None  Social History Narrative  . None    Outpatient Encounter Medications as of 02/05/2017  Medication Sig  . acetaminophen (TYLENOL) 500 MG tablet Take 500 mg by mouth every 6 (six) hours as needed.  Marland Kitchen albuterol (ACCUNEB) 0.63 MG/3ML nebulizer solution Take 3 mLs (0.63 mg total) by nebulization every 6 (six) hours as needed for wheezing or shortness of breath.  Marland Kitchen albuterol (VENTOLIN HFA) 108 (90 Base) MCG/ACT inhaler Inhale 2 puffs into the lungs 4 (four) times daily as needed for wheezing or shortness of breath.  . allopurinol (ZYLOPRIM) 100 MG tablet TAKE 2 TABLETS ONE TIME DAILY  . aspirin EC 81 MG tablet Take 1 tablet (81 mg total) by mouth daily.  Marland Kitchen b complex vitamins tablet Take 1 tablet by mouth daily.  . budesonide-formoterol (SYMBICORT) 160-4.5 MCG/ACT inhaler INHALE 2 PUFFS INTO THE LUNGS 2 TIMES DAILY  . cholecalciferol (VITAMIN D) 1000 units tablet Take 1,000 Units daily by mouth.  . Cyanocobalamin (VITAMIN B-12 PO) Take by mouth daily.  . Ferrous Fumarate-Folic Acid (HEMOCYTE-F) 324-1 MG TABS Take 1 tablet by mouth daily.  . fluticasone (FLONASE) 50 MCG/ACT nasal spray Place 1 spray into both nostrils daily.    . furosemide (LASIX) 40 MG tablet 1 tab po daily and a 2nd tab po daily prn increased edema, wt gain>3 #/24 hour  . glucose blood (ONE TOUCH TEST STRIPS) test strip Use as instructed to check blood sugar once weekly. DX 790.29   . loratadine (CLARITIN) 10 MG tablet Take 1 tablet (10 mg total) by mouth at bedtime.  . metoprolol succinate (TOPROL-XL) 50 MG 24 hr tablet Take 75 mg (1.5 tablets) daily.  . montelukast (SINGULAIR) 10 MG tablet Take 1  tablet (10 mg total) by mouth at bedtime as needed.  . Niacin (VITAMIN B-3 PO) Take 1 tablet by mouth daily.  . OXYGEN Inhale into the lungs.  . potassium chloride SA (K-DUR,KLOR-CON) 20 MEQ tablet Take 1 tablet (20 mEq total) by mouth daily. And a 2nd tab po daily prn extra Furosemide tab  . pravastatin (PRAVACHOL) 10 MG tablet Take 1 tablet (10 mg total) by mouth daily.  . Probiotic Product (PROBIOTIC PO) Take by mouth daily.  . ranitidine (ZANTAC) 150 MG tablet Take 1 tablet (150 mg total) by mouth 2 (two) times daily.  . sertraline (ZOLOFT) 100 MG tablet TAKE 1 TABLET (100 MG TOTAL) BY MOUTH DAILY.  Marland Kitchen SPIRIVA HANDIHALER 18 MCG inhalation capsule PLACE 1 CAPSULE (18 MCG TOTAL) INTO INHALER AND INHALE CONTENTS ONE TIME DAILY.  . traMADol (ULTRAM) 50 MG tablet TAKE 1 TABLET BY MOUTH THREE TIMES DAILY AS NEEDED  . metolazone (ZAROXOLYN) 2.5 MG tablet 1 tab po daily 1/2 hour prior to Lasix prn pedal edema, increasing SOB, weight gain >3# (Patient not taking: Reported on 02/05/2017)   No facility-administered encounter medications on file as of 02/05/2017.     Activities of Daily Living In your present state of health, do you have any difficulty performing the following activities: 02/05/2017  Hearing? Y  Comment has hearing aids but does not wear them.  Vision? N  Comment Dr.Bernstoff yearly. Wearing glasses.  Difficulty concentrating or making decisions? Y  Walking or climbing stairs? Y  Comment has to hold to rail and SOB  Dressing or bathing? N   Doing errands, shopping? N  Preparing Food and eating ? N  Using the Toilet? N  In the past six months, have you accidently leaked urine? N  Do you have problems with loss of bowel control? N  Managing your Medications? N  Managing your Finances? N  Housekeeping or managing your Housekeeping? N  Some recent data might be hidden    Patient Care Team: Mosie Lukes, MD as PCP - General (Family Medicine) Elsie Stain, MD as Consulting Physician (Pulmonary Disease) Shirley Muscat Loreen Freud, MD as Referring Physician (Optometry) Parrett, Fonnie Mu, NP as Nurse Practitioner (Pulmonary Disease) Bo Merino, MD as Consulting Physician (Rheumatology)    Assessment:   This is a routine wellness examination for Magdalynn. Physical assessment deferred to PCP.  Exercise Activities and Dietary recommendations Current Exercise Habits: The patient does not participate in regular exercise at present, Exercise limited by: respiratory conditions(s) Diet (meal preparation, eat out, water intake, caffeinated beverages, dairy products, fruits and vegetables): in general, a "healthy" diet  , well balanced    Goals    . BEGIN [employer] AT LEAST TWICE PER WEEK. (pt-stated)       Fall Risk Fall Risk  02/05/2017 11/28/2016 01/07/2016 09/25/2015 09/25/2015  Falls in the past year? No No Yes Yes -  Number falls in past yr: - - 1 1 -  Comment - - Caught toe on a step and fell forward. - -  Injury with Fall? - - - Yes Yes  Comment - - - - patient report hurting right wrist but not seriouly injuried  Follow up - - Falls prevention discussed - -    Depression Screen PHQ 2/9 Scores 02/05/2017 11/28/2016 01/07/2016 09/25/2015  PHQ - 2 Score 1 1 2  0  PHQ- 9 Score - - 8 -     Cognitive Function MMSE - Mini Mental State Exam 02/05/2017 01/07/2016  Orientation to time  5 4  Orientation to Place 5 5  Registration 3 3  Attention/ Calculation 5 5  Recall 3 3  Language- name 2 objects 2 2  Language- repeat 1 1   Language- follow 3 step command 3 3  Language- read & follow direction 1 1  Write a sentence 1 1  Copy design 1 0  Total score 30 28        Immunization History  Administered Date(s) Administered  . Influenza Split 12/08/2011  . Influenza Whole 10/20/2006, 11/09/2007, 11/14/2008, 10/30/2009  . Influenza, High Dose Seasonal PF 09/25/2015, 10/21/2015  . Influenza,inj,Quad PF,6+ Mos 10/20/2012, 11/28/2013, 10/02/2014, 09/18/2016  . Pneumococcal Conjugate-13 02/21/2013  . Pneumococcal Polysaccharide-23 06/30/2006  . Td 06/01/2007   Screening Tests Health Maintenance  Topic Date Due  . URINE MICROALBUMIN  03/01/2015  . OPHTHALMOLOGY EXAM  10/08/2015  . FOOT EXAM  04/09/2016  . HEMOGLOBIN A1C  04/21/2017  . TETANUS/TDAP  05/31/2017  . COLONOSCOPY  11/10/2018  . INFLUENZA VACCINE  Completed  . DEXA SCAN  Completed  . PNA vac Low Risk Adult  Completed      Plan:   Follow up with Dr.Blyth as scheduled 03/19/17.  Continue to eat heart healthy diet (full of fruits, vegetables, whole grains, lean protein, water--limit salt, fat, and sugar intake) and increase physical activity as tolerated.  Continue doing brain stimulating activities (puzzles, reading, adult coloring books, staying active) to keep memory sharp.   I have ordered your mammogram and bone density scan. They will call you to schedule.  I have personally reviewed and noted the following in the patient's chart:   . Medical and social history . Use of alcohol, tobacco or illicit drugs  . Current medications and supplements . Functional ability and status . Nutritional status . Physical activity . Advanced directives . List of other physicians . Hospitalizations, surgeries, and ER visits in previous 12 months . Vitals . Screenings to include cognitive, depression, and falls . Referrals and appointments  In addition, I have reviewed and discussed with patient certain preventive protocols, quality metrics, and  best practice recommendations. A written personalized care plan for preventive services as well as general preventive health recommendations were provided to patient.     Shela Nevin, [place]  02/05/2017

## 2017-02-05 ENCOUNTER — Ambulatory Visit (INDEPENDENT_AMBULATORY_CARE_PROVIDER_SITE_OTHER): Payer: Medicare HMO | Admitting: *Deleted

## 2017-02-05 ENCOUNTER — Encounter: Payer: Self-pay | Admitting: *Deleted

## 2017-02-05 VITALS — BP 132/63 | HR 60 | Wt 196.2 lb

## 2017-02-05 DIAGNOSIS — Z0000 Encounter for general adult medical examination without abnormal findings: Secondary | ICD-10-CM

## 2017-02-05 DIAGNOSIS — Z1239 Encounter for other screening for malignant neoplasm of breast: Secondary | ICD-10-CM

## 2017-02-05 DIAGNOSIS — Z780 Asymptomatic menopausal state: Secondary | ICD-10-CM

## 2017-02-05 NOTE — Patient Instructions (Signed)
Follow up with Dr.Blyth as scheduled 03/19/17.  Continue to eat heart healthy diet (full of fruits, vegetables, whole grains, lean protein, water--limit salt, fat, and sugar intake) and increase physical activity as tolerated.  Continue doing brain stimulating activities (puzzles, reading, adult coloring books, staying active) to keep memory sharp.   I have ordered your mammogram and bone density scan. They will call you to schedule.   [name] , Thank you for taking time to come for your Medicare Wellness Visit. I appreciate your ongoing commitment to your health goals. Please review the following plan we discussed and let me know if I can assist you in the future.   These are the goals we discussed: Goals    . BEGIN [employer] AT LEAST TWICE PER WEEK. (pt-stated)       This is a list of the screening recommended for you and due dates:  Health Maintenance  Topic Date Due  . Urine Protein Check  03/01/2015  . Eye exam for diabetics  10/08/2015  . Complete foot exam   04/09/2016  . Hemoglobin A1C  04/21/2017  . Tetanus Vaccine  05/31/2017  . Colon Cancer Screening  11/10/2018  . Flu Shot  Completed  . DEXA scan (bone density measurement)  Completed  . Pneumonia vaccines  Completed    Health Maintenance for Postmenopausal Women Menopause is a normal process in which your reproductive ability comes to an end. This process happens gradually over a span of months to years, usually between the ages of 58 and 26. Menopause is complete when you have missed 12 consecutive menstrual periods. It is important to talk with your health care provider about some of the most common conditions that affect postmenopausal women, such as heart disease, cancer, and bone loss (osteoporosis). Adopting a healthy lifestyle and getting preventive care can help to promote your health and wellness. Those actions can also lower your chances of developing some of these common conditions. What should I know about  menopause? During menopause, you may experience a number of symptoms, such as:  Moderate-to-severe hot flashes.  Night sweats.  Decrease in sex drive.  Mood swings.  Headaches.  Tiredness.  Irritability.  Memory problems.  Insomnia.  Choosing to treat or not to treat menopausal changes is an individual decision that you make with your health care provider. What should I know about hormone replacement therapy and supplements? Hormone therapy products are effective for treating symptoms that are associated with menopause, such as hot flashes and night sweats. Hormone replacement carries certain risks, especially as you become older. If you are thinking about using estrogen or estrogen with progestin treatments, discuss the benefits and risks with your health care provider. What should I know about heart disease and stroke? Heart disease, heart attack, and stroke become more likely as you age. This may be due, in part, to the hormonal changes that your body experiences during menopause. These can affect how your body processes dietary fats, triglycerides, and cholesterol. Heart attack and stroke are both medical emergencies. There are many things that you can do to help prevent heart disease and stroke:  Have your blood pressure checked at least every 1-2 years. High blood pressure causes heart disease and increases the risk of stroke.  If you are 46-101 years old, ask your health care provider if you should take aspirin to prevent a heart attack or a stroke.  Do not use any tobacco products, including cigarettes, chewing tobacco, or electronic cigarettes. If you need  help quitting, ask your health care provider.  It is important to eat a healthy diet and maintain a healthy weight. ? Be sure to include plenty of vegetables, fruits, low-fat dairy products, and lean protein. ? Avoid eating foods that are high in solid fats, added sugars, or salt (sodium).  Get regular exercise. This  is one of the most important things that you can do for your health. ? Try to exercise for at least 150 minutes each week. The type of exercise that you do should increase your heart rate and make you sweat. This is known as moderate-intensity exercise. ? Try to do strengthening exercises at least twice each week. Do these in addition to the moderate-intensity exercise.  Know your numbers.Ask your health care provider to check your cholesterol and your blood glucose. Continue to have your blood tested as directed by your health care provider.  What should I know about cancer screening? There are several types of cancer. Take the following steps to reduce your risk and to catch any cancer development as early as possible. Breast Cancer  Practice breast self-awareness. ? This means understanding how your breasts normally appear and feel. ? It also means doing regular breast self-exams. Let your health care provider know about any changes, no matter how small.  If you are 70 or older, have a clinician do a breast exam (clinical breast exam or CBE) every year. Depending on your age, family history, and medical history, it may be recommended that you also have a yearly breast X-ray (mammogram).  If you have a family history of breast cancer, talk with your health care provider about genetic screening.  If you are at high risk for breast cancer, talk with your health care provider about having an MRI and a mammogram every year.  Breast cancer (BRCA) gene test is recommended for women who have family members with BRCA-related cancers. Results of the assessment will determine the need for genetic counseling and BRCA1 and for BRCA2 testing. BRCA-related cancers include these types: ? Breast. This occurs in males or females. ? Ovarian. ? Tubal. This may also be called fallopian tube cancer. ? Cancer of the abdominal or pelvic lining (peritoneal cancer). ? Prostate. ? Pancreatic.  Cervical,  Uterine, and Ovarian Cancer Your health care provider may recommend that you be screened regularly for cancer of the pelvic organs. These include your ovaries, uterus, and vagina. This screening involves a pelvic exam, which includes checking for microscopic changes to the surface of your cervix (Pap test).  For women ages 21-65, health care providers may recommend a pelvic exam and a Pap test every three years. For women ages 61-65, they may recommend the Pap test and pelvic exam, combined with testing for human papilloma virus (HPV), every five years. Some types of HPV increase your risk of cervical cancer. Testing for HPV may also be done on women of any age who have unclear Pap test results.  Other health care providers may not recommend any screening for nonpregnant women who are considered low risk for pelvic cancer and have no symptoms. Ask your health care provider if a screening pelvic exam is right for you.  If you have had past treatment for cervical cancer or a condition that could lead to cancer, you need Pap tests and screening for cancer for at least 20 years after your treatment. If Pap tests have been discontinued for you, your risk factors (such as having a new sexual partner) need to be reassessed  to determine if you should start having screenings again. Some women have medical problems that increase the chance of getting cervical cancer. In these cases, your health care provider may recommend that you have screening and Pap tests more often.  If you have a family history of uterine cancer or ovarian cancer, talk with your health care provider about genetic screening.  If you have vaginal bleeding after reaching menopause, tell your health care provider.  There are currently no reliable tests available to screen for ovarian cancer.  Lung Cancer Lung cancer screening is recommended for adults 48-49 years old who are at high risk for lung cancer because of a history of smoking. A  yearly low-dose CT scan of the lungs is recommended if you:  Currently smoke.  Have a history of at least 30 pack-years of smoking and you currently smoke or have quit within the past 15 years. A pack-year is smoking an average of one pack of cigarettes per day for one year.  Yearly screening should:  Continue until it has been 15 years since you quit.  Stop if you develop a health problem that would prevent you from having lung cancer treatment.  Colorectal Cancer  This type of cancer can be detected and can often be prevented.  Routine colorectal cancer screening usually begins at age 28 and continues through age 34.  If you have risk factors for colon cancer, your health care provider may recommend that you be screened at an earlier age.  If you have a family history of colorectal cancer, talk with your health care provider about genetic screening.  Your health care provider may also recommend using home test kits to check for hidden blood in your stool.  A small camera at the end of a tube can be used to examine your colon directly (sigmoidoscopy or colonoscopy). This is done to check for the earliest forms of colorectal cancer.  Direct examination of the colon should be repeated every 5-10 years until age 87. However, if early forms of precancerous polyps or small growths are found or if you have a family history or genetic risk for colorectal cancer, you may need to be screened more often.  Skin Cancer  Check your skin from head to toe regularly.  Monitor any moles. Be sure to tell your health care provider: ? About any new moles or changes in moles, especially if there is a change in a mole's shape or color. ? If you have a mole that is larger than the size of a pencil eraser.  If any of your family members has a history of skin cancer, especially at a young age, talk with your health care provider about genetic screening.  Always use sunscreen. Apply sunscreen liberally  and repeatedly throughout the day.  Whenever you are outside, protect yourself by wearing long sleeves, pants, a wide-brimmed hat, and sunglasses.  What should I know about osteoporosis? Osteoporosis is a condition in which bone destruction happens more quickly than new bone creation. After menopause, you may be at an increased risk for osteoporosis. To help prevent osteoporosis or the bone fractures that can happen because of osteoporosis, the following is recommended:  If you are 56-42 years old, get at least 1,000 mg of calcium and at least 600 mg of vitamin D per day.  If you are older than age 62 but younger than age 90, get at least 1,200 mg of calcium and at least 600 mg of vitamin D per day.  If you are older than age 39, get at least 1,200 mg of calcium and at least 800 mg of vitamin D per day.  Smoking and excessive alcohol intake increase the risk of osteoporosis. Eat foods that are rich in calcium and vitamin D, and do weight-bearing exercises several times each week as directed by your health care provider. What should I know about how menopause affects my mental health? Depression may occur at any age, but it is more common as you become older. Common symptoms of depression include:  Low or sad mood.  Changes in sleep patterns.  Changes in appetite or eating patterns.  Feeling an overall lack of motivation or enjoyment of activities that you previously enjoyed.  Frequent crying spells.  Talk with your health care provider if you think that you are experiencing depression. What should I know about immunizations? It is important that you get and maintain your immunizations. These include:  Tetanus, diphtheria, and pertussis (Tdap) booster vaccine.  Influenza every year before the flu season begins.  Pneumonia vaccine.  Shingles vaccine.  Your health care provider may also recommend other immunizations. This information is not intended to replace advice given to you  by your health care provider. Make sure you discuss any questions you have with your health care provider. Document Released: 02/28/2005 Document Revised: 07/27/2015 Document Reviewed: 10/10/2014 Elsevier Interactive Patient Education  2018 [employer].

## 2017-02-09 ENCOUNTER — Other Ambulatory Visit: Payer: Self-pay | Admitting: Family Medicine

## 2017-02-09 NOTE — Telephone Encounter (Signed)
Requesting: TraMADol 50 MG Contract: 03/21/16 UDS: 12/23/16 LOW RISK Last OV: 12/23/16 Next OV: 03/19/17 Last Refill: 12/22/16   Please advise

## 2017-03-09 ENCOUNTER — Other Ambulatory Visit: Payer: Self-pay | Admitting: Family Medicine

## 2017-03-17 MED FILL — FUROSEMIDE 40 MG TAB: 40 | 50 days supply | Qty: 100 | Fill #2

## 2017-03-19 ENCOUNTER — Encounter: Payer: Self-pay | Admitting: Family Medicine

## 2017-03-19 ENCOUNTER — Ambulatory Visit (INDEPENDENT_AMBULATORY_CARE_PROVIDER_SITE_OTHER): Payer: Medicare HMO | Admitting: Family Medicine

## 2017-03-19 VITALS — BP 132/68 | HR 70 | Temp 97.8°F | Resp 16 | Ht 64.96 in | Wt 192.2 lb

## 2017-03-19 DIAGNOSIS — R7989 Other specified abnormal findings of blood chemistry: Secondary | ICD-10-CM | POA: Diagnosis not present

## 2017-03-19 DIAGNOSIS — E782 Mixed hyperlipidemia: Secondary | ICD-10-CM | POA: Diagnosis not present

## 2017-03-19 DIAGNOSIS — E118 Type 2 diabetes mellitus with unspecified complications: Secondary | ICD-10-CM

## 2017-03-19 DIAGNOSIS — D649 Anemia, unspecified: Secondary | ICD-10-CM

## 2017-03-19 DIAGNOSIS — R1084 Generalized abdominal pain: Secondary | ICD-10-CM | POA: Diagnosis not present

## 2017-03-19 DIAGNOSIS — M109 Gout, unspecified: Secondary | ICD-10-CM | POA: Diagnosis not present

## 2017-03-19 DIAGNOSIS — N390 Urinary tract infection, site not specified: Secondary | ICD-10-CM

## 2017-03-19 DIAGNOSIS — N289 Disorder of kidney and ureter, unspecified: Secondary | ICD-10-CM

## 2017-03-19 NOTE — Assessment & Plan Note (Signed)
Encouraged heart healthy diet, increase exercise, avoid trans fats, consider a krill oil cap daily 

## 2017-03-19 NOTE — Assessment & Plan Note (Signed)
hgba1c acceptable, minimize simple carbs. Increase exercise as tolerated. Continue current meds 

## 2017-03-19 NOTE — Assessment & Plan Note (Signed)
Check cmp 

## 2017-03-19 NOTE — Assessment & Plan Note (Signed)
Check uric acid. 

## 2017-03-19 NOTE — Assessment & Plan Note (Signed)
Check cbc 

## 2017-03-19 NOTE — Patient Instructions (Signed)
Cholelithiasis Cholelithiasis is also called "gallstones." It is a kind of gallbladder disease. The gallbladder is an organ that stores a liquid (bile) that helps you digest fat. Gallstones may not cause symptoms (may be silent gallstones) until they cause a blockage, and then they can cause pain (gallbladder attack). Follow these instructions at home:  Take over-the-counter and prescription medicines only as told by your doctor.  Stay at a healthy weight.  Eat healthy foods. This includes: ? Eating fewer fatty foods, like fried foods. ? Eating fewer refined carbs (refined carbohydrates). Refined carbs are breads and grains that are highly processed, like white bread and white rice. Instead, choose whole grains like whole-wheat bread and brown rice. ? Eating more fiber. Almonds, fresh fruit, and beans are healthy sources of fiber.  Keep all follow-up visits as told by your doctor. This is important. Contact a doctor if:  You have sudden pain in the upper right side of your belly (abdomen). Pain might spread to your right shoulder or your chest. This may be a sign of a gallbladder attack.  You feel sick to your stomach (are nauseous).  You throw up (vomit).  You have been diagnosed with gallstones that have no symptoms and you get: ? Belly pain. ? Discomfort, burning, or fullness in the upper part of your belly (indigestion). Get help right away if:  You have sudden pain in the upper right side of your belly, and it lasts for more than 2 hours.  You have belly pain that lasts for more than 5 hours.  You have a fever or chills.  You keep feeling sick to your stomach or you keep throwing up.  Your skin or the whites of your eyes turn yellow (jaundice).  You have dark-colored pee (urine).  You have light-colored poop (stool). Summary  Cholelithiasis is also called "gallstones."  The gallbladder is an organ that stores a liquid (bile) that helps you digest fat.  Silent  gallstones are gallstones that do not cause symptoms.  A gallbladder attack may cause sudden pain in the upper right side of your belly. Pain might spread to your right shoulder or your chest. If this happens, contact your doctor.  If you have sudden pain in the upper right side of your belly that lasts for more than 2 hours, get help right away. This information is not intended to replace advice given to you by your health care provider. Make sure you discuss any questions you have with your health care provider. Document Released: 06/25/2007 Document Revised: 09/23/2015 Document Reviewed: 09/23/2015 Elsevier Interactive Patient Education  2017 Elsevier Inc.  

## 2017-03-19 NOTE — Progress Notes (Signed)
Subjective:  I acted as a Education administrator for [employer]. Yancey Flemings, [place]   Patient ID: [name], female    DOB: Jul 09, 1940, 77 y.o.   MRN: [id number]  Chief Complaint  Patient presents with  . Follow-up    HPI  Patient is in today for follow up visit and she acknowledges feeling tired and blue lately. Her sister in law with whom she is very close has been very ill and in the hospital. She is also worried about her own CHF and CRI. No recent changes in diet or activity levels. Denies CP/palp/SOB/HA/congestion/fevers/GI or GU c/o. Taking meds as prescribed  Patient Care Team: Mosie Lukes, MD as PCP - General (Family Medicine) Shirley Muscat Loreen Freud, MD as Referring Physician (Optometry) Parrett, Fonnie Mu, NP as Nurse Practitioner (Pulmonary Disease) Rigoberto Noel, MD as Consulting Physician (Pulmonary Disease) Donato Heinz, MD as Consulting Physician (Nephrology)   Past Medical History:  Diagnosis Date  . Abnormal glucose tolerance test   . Abnormal TSH 09/22/2016  . ACE-inhibitor cough   . Anemia 03/12/2014  . Arthritis   . Asthma    PFT 02/06/09 FEV1 1.41 (65%), FVC 1.92 (64), FEV1% 74, TLC 3.47 (71%), DLCO 48%, +BD  . Atypical chest pain    s/p cath, Normal coronaries, Non ST elevation myocardial infarction, Rt groin pseudoaneurysm  . Bacterial vaginosis 03/12/2014  . Cellulitis 06/22/2016  . Chronic kidney disease (CKD), stage III (moderate) ([place]) 08/04/2016  . COPD (chronic obstructive pulmonary disease) ([place])   . Depression   . Diastolic heart failure ([place]) 04/07/2016  . DVT (deep venous thrombosis) ([place]) 1987  . Fall at home 12/04/2014  . GERD (gastroesophageal reflux disease)   . Gout   . Hypercalcemia 10/15/2014  . Hyperlipidemia   . Hypertension   . Hypoxia 10/15/2014  . Macular degeneration 04/10/2015  . Osteopenia 12/29/2006   Qualifier: Diagnosis of  By: Wynona Luna   . Pneumonia   . 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 Renal insufficiency 09/22/2016  .  Sun-damaged skin 10/24/2012  . Unspecified constipation 06/05/2013  . Urinary frequency 10/24/2012  . UTI (lower urinary tract infection) 10/24/2012  . Vitamin D deficiency 01/01/2015    Past Surgical History:  Procedure Laterality Date  . APPENDECTOMY  1951  . TONSILLECTOMY  1950  . TUBAL LIGATION  1968    Family History  Problem Relation Age of Onset  . Asthma Sister   . Arthritis Sister   . Hypertension Sister   . Hyperlipidemia Sister   . Uterine cancer Sister   . Coronary artery disease Brother        x2  . Arthritis Brother   . Lung cancer Brother        smoker  . Hypertension Sister   . Arthritis Sister   . Hyperlipidemia Sister   . Emphysema Sister   . COPD Sister        smoker  . Heart disease Sister   . Hyperlipidemia Sister   . Hypertension Sister   . Arthritis Sister   . Emphysema Brother   . Heart disease Brother         smoker  . Heart attack Brother   . Mental illness Father   . Alcohol abuse Father   . Suicidality Father   . Heart disease Mother   . Hyperlipidemia Mother   . Heart attack Mother   . Epilepsy Daughter   . Hypertension Daughter   . Obesity Daughter   .  COPD Brother        smoker  . Lung cancer Brother   . Coronary artery disease Other   . Colon polyps Sister     Social History   Socioeconomic History  . Marital status: Widowed    Spouse name: Not on file  . Number of children: 1  . Years of education: Not on file  . Highest education level: Not on file  Social Needs  . Financial resource strain: Not on file  . Food insecurity - worry: Not on file  . Food insecurity - inability: Not on file  . Transportation needs - medical: Not on file  . Transportation needs - non-medical: Not on file  Occupational History  . Occupation: Retired    Fish farm manager: [employer]  Tobacco Use  . Smoking status: Never Smoker  . Smokeless tobacco: Never Used  Substance and Sexual Activity  . Alcohol use: No  . Drug use: No  . Sexual activity:  No    Comment: lives alone, no dietary restrictions  Other Topics Concern  . Not on file  Social History Narrative  . Not on file    Outpatient Medications Prior to Visit  Medication Sig Dispense Refill  . acetaminophen (TYLENOL) 500 MG tablet Take 500 mg by mouth every 6 (six) hours as needed.    Marland Kitchen albuterol (ACCUNEB) 0.63 MG/3ML nebulizer solution Take 3 mLs (0.63 mg total) by nebulization every 6 (six) hours as needed for wheezing or shortness of breath. 150 mL 0  . albuterol (VENTOLIN HFA) 108 (90 Base) MCG/ACT inhaler Inhale 2 puffs into the lungs 4 (four) times daily as needed for wheezing or shortness of breath. 3 Inhaler 0  . allopurinol (ZYLOPRIM) 100 MG tablet TAKE 2 TABLETS ONE TIME DAILY 180 tablet 1  . aspirin EC 81 MG tablet Take 1 tablet (81 mg total) by mouth daily.    Marland Kitchen b complex vitamins tablet Take 1 tablet by mouth daily.    . budesonide-formoterol (SYMBICORT) 160-4.5 MCG/ACT inhaler INHALE 2 PUFFS INTO THE LUNGS 2 TIMES DAILY 3 Inhaler 1  . cholecalciferol (VITAMIN D) 1000 units tablet Take 1,000 Units daily by mouth.    . Cyanocobalamin (VITAMIN B-12 PO) Take by mouth daily.    . Ferrous Fumarate-Folic Acid (HEMOCYTE-F) 324-1 MG TABS Take 1 tablet by mouth daily. 90 each 1  . fluticasone (FLONASE) 50 MCG/ACT nasal spray Place 1 spray into both nostrils daily. 16 g 3  . furosemide (LASIX) 40 MG tablet 1 tab po daily and a 2nd tab po daily prn increased edema, wt gain>3 #/24 hour 100 tablet 5  . glucose blood (ONE TOUCH TEST STRIPS) test strip Use as instructed to check blood sugar once weekly. DX 790.29     . loratadine (CLARITIN) 10 MG tablet Take 1 tablet (10 mg total) by mouth at bedtime. 30 tablet 5  . metolazone (ZAROXOLYN) 2.5 MG tablet 1 tab po daily 1/2 hour prior to Lasix prn pedal edema, increasing SOB, weight gain >3# 20 tablet 1  . metoprolol succinate (TOPROL-XL) 50 MG 24 hr tablet Take 75 mg (1.5 tablets) daily. 135 tablet 3  . montelukast (SINGULAIR) 10  MG tablet Take 1 tablet (10 mg total) by mouth at bedtime as needed. 30 tablet 3  . Niacin (VITAMIN B-3 PO) Take 1 tablet by mouth daily.    . OXYGEN Inhale into the lungs.    . potassium chloride SA (K-DUR,KLOR-CON) 20 MEQ tablet Take 1 tablet (20 mEq total) by  mouth daily. And a 2nd tab po daily prn extra Furosemide tab 100 tablet 5  . pravastatin (PRAVACHOL) 10 MG tablet Take 1 tablet (10 mg total) by mouth daily. 90 tablet 1  . Probiotic Product (PROBIOTIC PO) Take by mouth daily.    . ranitidine (ZANTAC) 150 MG tablet Take 1 tablet (150 mg total) by mouth 2 (two) times daily. 180 tablet 1  . sertraline (ZOLOFT) 100 MG tablet TAKE 1 TABLET (100 MG TOTAL) BY MOUTH DAILY. 90 tablet 3  . SPIRIVA HANDIHALER 18 MCG inhalation capsule PLACE 1 CAPSULE (18 MCG TOTAL) INTO INHALER AND INHALE CONTENTS ONE TIME DAILY. 90 capsule 1  . traMADol (ULTRAM) 50 MG tablet TAKE 1 TABLET BY MOUTH THREE TIMES DAILY AS NEEDED 90 tablet 0   No facility-administered medications prior to visit.     Allergies  Allergen Reactions  . Oseltamivir Phosphate     TAMIFLU REACTION: nausea, vomiting, diarrhea, dizziness  . Sulfa Antibiotics Other (See Comments)    CP  . Ace Inhibitors   . Indomethacin   . Montelukast Sodium     REACTION: Heart palpitations, chest pain  . Sulfonamide Derivatives     Review of Systems  Constitutional: Positive for malaise/fatigue. Negative for fever.  HENT: Negative for congestion.   Eyes: Negative for blurred vision.  Respiratory: Negative for cough.   Cardiovascular: Positive for leg swelling. Negative for chest pain and palpitations.  Gastrointestinal: Negative for vomiting.  Genitourinary: Positive for frequency.  Musculoskeletal: Negative for back pain.  Skin: Negative for rash.  Neurological: Negative for loss of consciousness and headaches.       Objective:    Physical Exam  Constitutional: She is oriented to person, place, and time. She appears well-developed and  well-nourished. No distress.  HENT:  Head: Normocephalic and atraumatic.  Nose: Nose normal.  Eyes: Right eye exhibits no discharge. Left eye exhibits no discharge.  Neck: Normal range of motion. Neck supple.  Cardiovascular: Normal rate and regular rhythm.  No murmur heard. Pulmonary/Chest: Effort normal and breath sounds normal.  Abdominal: Soft. Bowel sounds are normal. There is no tenderness.  Musculoskeletal: She exhibits edema.  Neurological: She is alert and oriented to person, place, and time.  Skin: Skin is warm and dry.  Psychiatric: She has a normal mood and affect.  Nursing note and vitals reviewed.   BP 132/68 (BP Location: Left Arm, Patient Position: Sitting, Cuff Size: Large)   Pulse 70   Temp 97.8 F (36.6 C) (Oral)   Resp 16   Ht 5' 4.96" (1.65 m)   Wt 192 lb 3.2 oz (87.2 kg)   BMI 32.02 kg/m  Wt Readings from Last 3 Encounters:  03/19/17 192 lb 3.2 oz (87.2 kg)  02/05/17 196 lb 3.2 oz (89 kg)  12/23/16 192 lb (87.1 kg)   BP Readings from Last 3 Encounters:  03/19/17 132/68  02/05/17 132/63  12/23/16 124/70     Immunization History  Administered Date(s) Administered  . Influenza Split 12/08/2011  . Influenza Whole 10/20/2006, 11/09/2007, 11/14/2008, 10/30/2009  . Influenza, High Dose Seasonal PF 09/25/2015, 10/21/2015  . Influenza,inj,Quad PF,6+ Mos 10/20/2012, 11/28/2013, 10/02/2014, 09/18/2016  . Pneumococcal Conjugate-13 02/21/2013  . Pneumococcal Polysaccharide-23 06/30/2006  . Td 06/01/2007    Health Maintenance  Topic Date Due  . URINE MICROALBUMIN  03/01/2015  . OPHTHALMOLOGY EXAM  10/08/2015  . FOOT EXAM  04/09/2016  . HEMOGLOBIN A1C  04/21/2017  . TETANUS/TDAP  05/31/2017  . COLONOSCOPY  11/10/2018  .  INFLUENZA VACCINE  Completed  . DEXA SCAN  Completed  . PNA vac Low Risk Adult  Completed    Lab Results  Component Value Date   WBC 6.3 10/21/2016   HGB 11.3 (L) 10/21/2016   HCT 34.7 (L) 10/21/2016   PLT 143.0 (L) 10/21/2016    GLUCOSE 87 10/21/2016   CHOL 144 10/21/2016   TRIG 132.0 10/21/2016   HDL 46.80 10/21/2016   LDLDIRECT 142.7 12/16/2005   LDLCALC 71 10/21/2016   ALT 11 10/21/2016   AST 15 10/21/2016   NA 140 10/21/2016   K 4.8 10/21/2016   CL 101 10/21/2016   CREATININE 1.61 (H) 10/21/2016   BUN 44 (H) 10/21/2016   CO2 32 10/21/2016   TSH 4.73 (H) 08/18/2016   INR 1.0 09/12/2006   HGBA1C 5.9 10/21/2016   MICROALBUR 0.8 02/28/2014    Lab Results  Component Value Date   TSH 4.73 (H) 08/18/2016   Lab Results  Component Value Date   WBC 6.3 10/21/2016   HGB 11.3 (L) 10/21/2016   HCT 34.7 (L) 10/21/2016   MCV 99.2 10/21/2016   PLT 143.0 (L) 10/21/2016   Lab Results  Component Value Date   NA 140 10/21/2016   K 4.8 10/21/2016   CO2 32 10/21/2016   GLUCOSE 87 10/21/2016   BUN 44 (H) 10/21/2016   CREATININE 1.61 (H) 10/21/2016   BILITOT 0.7 10/21/2016   ALKPHOS 48 10/21/2016   AST 15 10/21/2016   ALT 11 10/21/2016   PROT 7.1 10/21/2016   ALBUMIN 4.1 10/21/2016   CALCIUM 10.0 10/21/2016   ANIONGAP 7 02/14/2016   GFR 33.11 (L) 10/21/2016   Lab Results  Component Value Date   CHOL 144 10/21/2016   Lab Results  Component Value Date   HDL 46.80 10/21/2016   Lab Results  Component Value Date   LDLCALC 71 10/21/2016   Lab Results  Component Value Date   TRIG 132.0 10/21/2016   Lab Results  Component Value Date   CHOLHDL 3 10/21/2016   Lab Results  Component Value Date   HGBA1C 5.9 10/21/2016         Assessment & Plan:   Problem List Items Addressed This Visit    Diabetes type 2, controlled ([place])    hgba1c acceptable, minimize simple carbs. Increase exercise as tolerated. Continue current meds      Relevant Orders   Hemoglobin A1c   Comprehensive metabolic panel   Hyperlipidemia, mixed    Encouraged heart healthy diet, increase exercise, avoid trans fats, consider a krill oil cap daily      Relevant Orders   Lipid panel   Gout    Check uric acid       Lower urinary tract infectious disease    Check UA and culture today      Relevant Orders   US Abdomen Complete   Urinalysis   Urine Culture   Anemia    Check cbc      Relevant Orders   CBC   Renal insufficiency    Check cmp      Abnormal TSH    Check tsh       Relevant Orders   TSH    Other Visit Diagnoses    Generalized abdominal pain    -  Primary   Relevant Orders   Sedimentation rate      I am having [name] maintain her glucose blood, acetaminophen, Niacin (VITAMIN B-3 PO), Cyanocobalamin (VITAMIN B-12 PO), OXYGEN, b  complex vitamins, Probiotic Product (PROBIOTIC PO), aspirin EC, montelukast, SPIRIVA HANDIHALER, loratadine, fluticasone, metolazone, sertraline, metoprolol succinate, budesonide-formoterol, albuterol, potassium chloride SA, Ferrous Fumarate-Folic Acid, furosemide, ranitidine, cholecalciferol, pravastatin, albuterol, traMADol, and allopurinol.  No orders of the defined types were placed in this encounter.   CMA served as Education administrator during this visit. History, Physical and Plan performed by medical provider. Documentation and orders reviewed and attested to.  Penni Homans, MD

## 2017-03-19 NOTE — Assessment & Plan Note (Signed)
Check tsh 

## 2017-03-19 NOTE — Assessment & Plan Note (Signed)
Check UA and culture today ?

## 2017-03-20 LAB — URINALYSIS, ROUTINE W REFLEX MICROSCOPIC
BILIRUBIN URINE: NEGATIVE
HGB URINE DIPSTICK: NEGATIVE
Ketones, ur: NEGATIVE
NITRITE: NEGATIVE
RBC / HPF: NONE SEEN (ref 0–?)
Specific Gravity, Urine: 1.02 (ref 1.000–1.030)
Total Protein, Urine: NEGATIVE
URINE GLUCOSE: NEGATIVE
Urobilinogen, UA: 0.2 (ref 0.0–1.0)
pH: 5.5 (ref 5.0–8.0)

## 2017-03-20 LAB — COMPREHENSIVE METABOLIC PANEL
ALT: 10 U/L (ref 0–35)
AST: 16 U/L (ref 0–37)
Albumin: 3.9 g/dL (ref 3.5–5.2)
Alkaline Phosphatase: 40 U/L (ref 39–117)
BUN: 52 mg/dL — ABNORMAL HIGH (ref 6–23)
CALCIUM: 10 mg/dL (ref 8.4–10.5)
CHLORIDE: 99 meq/L (ref 96–112)
CO2: 30 meq/L (ref 19–32)
Creatinine, Ser: 2.05 mg/dL — ABNORMAL HIGH (ref 0.40–1.20)
GFR: 25.03 mL/min — AB (ref >60.00)
Glucose, Bld: 93 mg/dL (ref 70–99)
POTASSIUM: 4.4 meq/L (ref 3.5–5.1)
Sodium: 140 mEq/L (ref 135–145)
Total Bilirubin: 0.5 mg/dL (ref 0.2–1.2)
Total Protein: 7.2 g/dL (ref 6.0–8.3)

## 2017-03-20 LAB — LIPID PANEL
CHOL/HDL RATIO: 3
Cholesterol: 143 mg/dL (ref 0–200)
HDL: 43.4 mg/dL (ref >39.00)
LDL CALC: 69 mg/dL (ref 0–99)
NonHDL: 99.18
TRIGLYCERIDES: 153 mg/dL — AB (ref 0.0–149.0)
VLDL: 30.6 mg/dL (ref 0.0–40.0)

## 2017-03-20 LAB — HEMOGLOBIN A1C: HEMOGLOBIN A1C: 5.5 % (ref 4.6–6.5)

## 2017-03-20 LAB — CBC
HCT: 33 % — ABNORMAL LOW (ref 36.0–46.0)
Hemoglobin: 10.9 g/dL — ABNORMAL LOW (ref 12.0–15.0)
MCHC: 33.1 g/dL (ref 30.0–36.0)
MCV: 100.2 fl — ABNORMAL HIGH (ref 78.0–100.0)
Platelets: 129 10*3/uL — ABNORMAL LOW (ref 150.0–400.0)
RBC: 3.3 Mil/uL — ABNORMAL LOW (ref 3.87–5.11)
RDW: 13.8 % (ref 11.5–15.5)
WBC: 6.5 10*3/uL (ref 4.0–10.5)

## 2017-03-20 LAB — TSH: TSH: 3.21 u[IU]/mL (ref 0.35–4.50)

## 2017-03-20 LAB — SEDIMENTATION RATE: Sed Rate: 19 mm/hr (ref 0–30)

## 2017-03-21 LAB — URINE CULTURE
MICRO NUMBER:: 90262505
SPECIMEN QUALITY:: ADEQUATE

## 2017-03-26 ENCOUNTER — Other Ambulatory Visit: Payer: Self-pay

## 2017-03-26 DIAGNOSIS — D509 Iron deficiency anemia, unspecified: Secondary | ICD-10-CM

## 2017-03-31 ENCOUNTER — Ambulatory Visit (HOSPITAL_BASED_OUTPATIENT_CLINIC_OR_DEPARTMENT_OTHER)
Admission: RE | Admit: 2017-03-31 | Discharge: 2017-03-31 | Disposition: A | Discharge status: Home / Self Care | Payer: Medicare HMO | Source: Ambulatory Visit | Attending: Family Medicine | Admitting: Family Medicine

## 2017-03-31 DIAGNOSIS — Z780 Asymptomatic menopausal state: Secondary | ICD-10-CM | POA: Diagnosis not present

## 2017-03-31 DIAGNOSIS — Z1231 Encounter for screening mammogram for malignant neoplasm of breast: Secondary | ICD-10-CM | POA: Insufficient documentation

## 2017-03-31 DIAGNOSIS — Z1239 Encounter for other screening for malignant neoplasm of breast: Secondary | ICD-10-CM

## 2017-03-31 DIAGNOSIS — N390 Urinary tract infection, site not specified: Secondary | ICD-10-CM

## 2017-03-31 DIAGNOSIS — K8020 Calculus of gallbladder without cholecystitis without obstruction: Secondary | ICD-10-CM | POA: Diagnosis not present

## 2017-03-31 DIAGNOSIS — M85852 Other specified disorders of bone density and structure, left thigh: Secondary | ICD-10-CM | POA: Diagnosis not present

## 2017-04-06 ENCOUNTER — Other Ambulatory Visit: Payer: Self-pay | Admitting: Family Medicine

## 2017-04-06 DIAGNOSIS — I5030 Unspecified diastolic (congestive) heart failure: Secondary | ICD-10-CM | POA: Diagnosis not present

## 2017-04-06 DIAGNOSIS — D631 Anemia in chronic kidney disease: Secondary | ICD-10-CM | POA: Diagnosis not present

## 2017-04-06 DIAGNOSIS — N184 Chronic kidney disease, stage 4 (severe): Secondary | ICD-10-CM | POA: Diagnosis not present

## 2017-04-06 DIAGNOSIS — N189 Chronic kidney disease, unspecified: Secondary | ICD-10-CM | POA: Diagnosis not present

## 2017-04-06 DIAGNOSIS — E1122 Type 2 diabetes mellitus with diabetic chronic kidney disease: Secondary | ICD-10-CM | POA: Diagnosis not present

## 2017-04-06 DIAGNOSIS — Z6834 Body mass index (BMI) 34.0-34.9, adult: Secondary | ICD-10-CM | POA: Diagnosis not present

## 2017-04-06 DIAGNOSIS — I739 Peripheral vascular disease, unspecified: Secondary | ICD-10-CM | POA: Diagnosis not present

## 2017-04-06 DIAGNOSIS — I129 Hypertensive chronic kidney disease with stage 1 through stage 4 chronic kidney disease, or unspecified chronic kidney disease: Secondary | ICD-10-CM | POA: Diagnosis not present

## 2017-04-06 DIAGNOSIS — N179 Acute kidney failure, unspecified: Secondary | ICD-10-CM | POA: Diagnosis not present

## 2017-04-08 NOTE — Telephone Encounter (Signed)
Pt called to check on status of refill, she states she is out of medication   [place] ([place]), [place] [phone] (Phone) [phone] (Fax)

## 2017-04-08 NOTE — Telephone Encounter (Signed)
Requesting:  ULTRAM  50 MG Contract: 03/21/16  UDS: 12/23/16  Low Risk Last OV:  03/19/17 Next OV:  06/16/17 Last Refill: 02/10/17  #90   Please advise

## 2017-04-08 NOTE — Telephone Encounter (Signed)
Needs to update contract at next visit

## 2017-04-11 ENCOUNTER — Other Ambulatory Visit: Payer: Self-pay | Admitting: Family Medicine

## 2017-05-15 ENCOUNTER — Encounter: Payer: Self-pay | Admitting: Cardiology

## 2017-05-18 ENCOUNTER — Encounter: Payer: Self-pay | Admitting: Cardiology

## 2017-05-19 ENCOUNTER — Encounter: Payer: Self-pay | Admitting: Cardiology

## 2017-05-22 ENCOUNTER — Other Ambulatory Visit: Payer: Self-pay | Admitting: Family Medicine

## 2017-05-25 NOTE — Telephone Encounter (Signed)
Copied from [place] [id number]. Topic: Quick Communication - Rx Refill/Question >> May 25, 2017  3:10 PM Oliver Pila B wrote: Medication: albuterol (VENTOLIN HFA) 108 (90 Base) MCG/ACT inhaler [[id number]] , budesonide-formoterol (SYMBICORT) 160-4.5 MCG/ACT inhaler [[id number]] Has the patient contacted their pharmacy? Yes.   (Agent: If no, request that the patient contact the pharmacy for the refill.) Preferred Pharmacy (with phone number or street name): [facility]  Agent: Please be advised that RX refills may take up to 3 business days. We ask that you follow-up with your pharmacy.

## 2017-05-26 ENCOUNTER — Other Ambulatory Visit: Payer: Self-pay | Admitting: Family Medicine

## 2017-05-29 NOTE — Telephone Encounter (Signed)
Requesting:tramadol Contract:yes UDS:low risk next screen 12/23/17 Last OV:03/19/17 Next OV:06/16/17 Last Refill:04/08/17  #[id number] Database:   Please advise

## 2017-06-03 ENCOUNTER — Ambulatory Visit: Payer: Medicare HMO | Admitting: Cardiology

## 2017-06-16 ENCOUNTER — Encounter: Payer: Self-pay | Admitting: Family Medicine

## 2017-06-16 ENCOUNTER — Ambulatory Visit (INDEPENDENT_AMBULATORY_CARE_PROVIDER_SITE_OTHER): Payer: Medicare HMO | Admitting: Family Medicine

## 2017-06-16 VITALS — BP 104/58 | HR 68 | Temp 97.7°F | Resp 18 | Wt 192.8 lb

## 2017-06-16 DIAGNOSIS — E118 Type 2 diabetes mellitus with unspecified complications: Secondary | ICD-10-CM | POA: Diagnosis not present

## 2017-06-16 DIAGNOSIS — M353 Polymyalgia rheumatica: Secondary | ICD-10-CM

## 2017-06-16 DIAGNOSIS — K5900 Constipation, unspecified: Secondary | ICD-10-CM | POA: Diagnosis not present

## 2017-06-16 DIAGNOSIS — R7989 Other specified abnormal findings of blood chemistry: Secondary | ICD-10-CM

## 2017-06-16 DIAGNOSIS — R739 Hyperglycemia, unspecified: Secondary | ICD-10-CM | POA: Diagnosis not present

## 2017-06-16 DIAGNOSIS — E559 Vitamin D deficiency, unspecified: Secondary | ICD-10-CM | POA: Diagnosis not present

## 2017-06-16 DIAGNOSIS — N289 Disorder of kidney and ureter, unspecified: Secondary | ICD-10-CM | POA: Diagnosis not present

## 2017-06-16 DIAGNOSIS — R000 Tachycardia, unspecified: Secondary | ICD-10-CM | POA: Diagnosis not present

## 2017-06-16 DIAGNOSIS — D649 Anemia, unspecified: Secondary | ICD-10-CM | POA: Diagnosis not present

## 2017-06-16 DIAGNOSIS — E782 Mixed hyperlipidemia: Secondary | ICD-10-CM

## 2017-06-16 DIAGNOSIS — I10 Essential (primary) hypertension: Secondary | ICD-10-CM | POA: Diagnosis not present

## 2017-06-16 DIAGNOSIS — J42 Unspecified chronic bronchitis: Secondary | ICD-10-CM

## 2017-06-16 NOTE — Assessment & Plan Note (Signed)
Was feeling better on IV iron given by her nephrologist is referred to hematology here in the building so she can be reevaluated and receive more consistent treatments.

## 2017-06-16 NOTE — Assessment & Plan Note (Signed)
rrr today

## 2017-06-16 NOTE — Assessment & Plan Note (Signed)
Check tsh 

## 2017-06-16 NOTE — Assessment & Plan Note (Signed)
Encouraged heart healthy diet, increase exercise, avoid trans fats, consider a krill oil cap daily 

## 2017-06-16 NOTE — Assessment & Plan Note (Signed)
hgba1c acceptable, minimize simple carbs. Increase exercise as tolerated.  

## 2017-06-16 NOTE — Assessment & Plan Note (Signed)
Encouraged increased hydration and fiber in diet. Daily probiotics. If bowels not moving can use MOM 2 tbls po in 4 oz of warm prune juice by mouth every 2-3 days. If no results then repeat in 4 hours with  Dulcolax suppository pr, may repeat again in 4 more hours as needed. Seek care if symptoms worsen. Consider daily Miralax and/or Dulcolax if symptoms persist.  

## 2017-06-16 NOTE — Assessment & Plan Note (Signed)
Generally doing well on oxygen at 2 liters

## 2017-06-16 NOTE — Patient Instructions (Addendum)
Encouraged increased hydration and fiber in diet. Daily probiotics. If bowels not moving can use MOM 2 tbls po in 4 oz of warm prune juice by mouth every 2-3 days. If no results then repeat in 4 hours with  Dulcolax suppository pr, may repeat again in 4 more hours as needed. Seek care if symptoms worsen. Consider daily Miralax and/or Dulcolax if symptoms persist.   Benefiber and Miralax mixed together and take once or twice daily   Hyland's leg cramp medicine as needed and hydrate a little more Constipation, Adult Constipation is when a person has fewer bowel movements in a week than normal, has difficulty having a bowel movement, or has stools that are dry, hard, or larger than normal. Constipation may be caused by an underlying condition. It may become worse with age if a person takes certain medicines and does not take in enough fluids. Follow these instructions at home: Eating and drinking   Eat foods that have a lot of fiber, such as fresh fruits and vegetables, whole grains, and beans.  Limit foods that are high in fat, low in fiber, or overly processed, such as french fries, hamburgers, cookies, candies, and soda.  Drink enough fluid to keep your urine clear or pale yellow. General instructions  Exercise regularly or as told by your health care provider.  Go to the restroom when you have the urge to go. Do not hold it in.  Take over-the-counter and prescription medicines only as told by your health care provider. These include any fiber supplements.  Practice pelvic floor retraining exercises, such as deep breathing while relaxing the lower abdomen and pelvic floor relaxation during bowel movements.  Watch your condition for any changes.  Keep all follow-up visits as told by your health care provider. This is important. Contact a health care provider if:  You have pain that gets worse.  You have a fever.  You do not have a bowel movement after 4 days.  You vomit.  You  are not hungry.  You lose weight.  You are bleeding from the anus.  You have thin, pencil-like stools. Get help right away if:  You have a fever and your symptoms suddenly get worse.  You leak stool or have blood in your stool.  Your abdomen is bloated.  You have severe pain in your abdomen.  You feel dizzy or you faint. This information is not intended to replace advice given to you by your health care provider. Make sure you discuss any questions you have with your health care provider. Document Released: 10/05/2003 Document Revised: 07/27/2015 Document Reviewed: 06/27/2015 Elsevier Interactive Patient Education  2018 [employer].

## 2017-06-16 NOTE — Assessment & Plan Note (Signed)
Doing much better

## 2017-06-16 NOTE — Progress Notes (Signed)
Subjective:  I acted as a Education administrator for Dr. Charlett Blake. [place], [place]  Patient ID: [name], female    DOB: 12-10-1940, 77 y.o.   MRN: [id number]  No chief complaint on file.   HPI  Patient is in today for a 3 month follow up. She notes persistnet fatigue and feels she felt better when she was receiving IV iron. No recent febrile illness or hospitalizations. She continues to struggle with R>L knee pain but no injury or fall and it is stable. Denies CP/palp/SOB/HA/congestion/fevers/GI or GU c/o. Taking meds as prescribed  Patient Care Team: Mosie Lukes, MD as PCP - General (Family Medicine) Shirley Muscat Loreen Freud, MD as Referring Physician (Optometry) Parrett, Fonnie Mu, NP as Nurse Practitioner (Pulmonary Disease) Rigoberto Noel, MD as Consulting Physician (Pulmonary Disease) Donato Heinz, MD as Consulting Physician (Nephrology)   Past Medical History:  Diagnosis Date  . Abnormal glucose tolerance test   . Abnormal TSH 09/22/2016  . ACE-inhibitor cough   . Anemia 03/12/2014  . Arthritis   . Asthma    PFT 02/06/09 FEV1 1.41 (65%), FVC 1.92 (64), FEV1% 74, TLC 3.47 (71%), DLCO 48%, +BD  . Atypical chest pain    s/p cath, Normal coronaries, Non ST elevation myocardial infarction, Rt groin pseudoaneurysm  . Bacterial vaginosis 03/12/2014  . Cellulitis 06/22/2016  . Chronic kidney disease (CKD), stage III (moderate) ([place]) 08/04/2016  . COPD (chronic obstructive pulmonary disease) ([place])   . Depression   . Diastolic heart failure ([place]) 04/07/2016  . DVT (deep venous thrombosis) ([place]) 1987  . Fall at home 12/04/2014  . GERD (gastroesophageal reflux disease)   . Gout   . Hypercalcemia 10/15/2014  . Hyperlipidemia   . Hypertension   . Hypoxia 10/15/2014  . Macular degeneration 04/10/2015  . Osteopenia 12/29/2006   Qualifier: Diagnosis of  By: Wynona Luna   . Pneumonia   . Polymyalgia rheumatica ([place]) 01/07/2016  . Renal insufficiency 09/22/2016  . Sun-damaged skin 10/24/2012  .  Unspecified constipation 06/05/2013  . Urinary frequency 10/24/2012  . UTI (lower urinary tract infection) 10/24/2012  . Vitamin D deficiency 01/01/2015    Past Surgical History:  Procedure Laterality Date  . APPENDECTOMY  1951  . TONSILLECTOMY  1950  . TUBAL LIGATION  1968    Family History  Problem Relation Age of Onset  . Asthma Sister   . Arthritis Sister   . Hypertension Sister   . Hyperlipidemia Sister   . Uterine cancer Sister   . Coronary artery disease Brother        x2  . Arthritis Brother   . Lung cancer Brother        smoker  . Hypertension Sister   . Arthritis Sister   . Hyperlipidemia Sister   . Emphysema Sister   . COPD Sister        smoker  . Heart disease Sister   . Hyperlipidemia Sister   . Hypertension Sister   . Arthritis Sister   . Emphysema Brother   . Heart disease Brother         smoker  . Heart attack Brother   . Mental illness Father   . Alcohol abuse Father   . Suicidality Father   . Heart disease Mother   . Hyperlipidemia Mother   . Heart attack Mother   . Epilepsy Daughter   . Hypertension Daughter   . Obesity Daughter   . COPD Brother  smoker  . Lung cancer Brother   . Coronary artery disease Other   . Colon polyps Sister     Social History   Socioeconomic History  . Marital status: Widowed    Spouse name: Not on file  . Number of children: 1  . Years of education: Not on file  . Highest education level: Not on file  Occupational History  . 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 Financial resource strain: Not on file  . Food insecurity:    Worry: Not on file    Inability: Not on file  . Transportation needs:    Medical: Not on file    Non-medical: Not on file  Tobacco Use  . Smoking status: Never Smoker  . Smokeless tobacco: Never Used  Substance and Sexual Activity  . Alcohol use: No  . Drug use: No  . Sexual activity: Never    Comment: lives alone, no dietary restrictions    Lifestyle  . Physical activity:    Days per week: Not on file    Minutes per session: Not on file  . Stress: Not on file  Relationships  . Social connections:    Talks on phone: Not on file    Gets together: Not on file    Attends religious service: Not on file    Active member of club or organization: Not on file    Attends meetings of clubs or organizations: Not on file    Relationship status: Not on file  . Intimate partner violence:    Fear of current or ex partner: Not on file    Emotionally abused: Not on file    Physically abused: Not on file    Forced sexual activity: Not on file  Other Topics Concern  . Not on file  Social History Narrative  . Not on file    Outpatient Medications Prior to Visit  Medication Sig Dispense Refill  . acetaminophen (TYLENOL) 500 MG tablet Take 500 mg by mouth every 6 (six) hours as needed.    Marland Kitchen albuterol (ACCUNEB) 0.63 MG/3ML nebulizer solution Take 3 mLs (0.63 mg total) by nebulization every 6 (six) hours as needed for wheezing or shortness of breath. 150 mL 0  . albuterol (PROVENTIL HFA;VENTOLIN HFA) 108 (90 Base) MCG/ACT inhaler INHALE 2 PUFFS INTO THE LUNGS 4 (FOUR) TIMES DAILY AS NEEDED FOR WHEEZING OR SHORTNESS OF BREATH. 54 g 0  . allopurinol (ZYLOPRIM) 100 MG tablet TAKE 2 TABLETS ONE TIME DAILY 180 tablet 1  . aspirin EC 81 MG tablet Take 1 tablet (81 mg total) by mouth daily.    Marland Kitchen b complex vitamins tablet Take 1 tablet by mouth daily.    . budesonide-formoterol (SYMBICORT) 160-4.5 MCG/ACT inhaler INHALE 2 PUFFS INTO THE LUNGS 2 TIMES DAILY 3 Inhaler 1  . cholecalciferol (VITAMIN D) 1000 units tablet Take 1,000 Units daily by mouth.    . Cyanocobalamin (VITAMIN B-12 PO) Take by mouth daily.    . Ferrous Fumarate-Folic Acid (HEMOCYTE-F) 324-1 MG TABS Take 1 tablet by mouth daily. 90 each 1  . fluticasone (FLONASE) 50 MCG/ACT nasal spray Place 1 spray into both nostrils daily. 16 g 3  . furosemide (LASIX) 40 MG tablet 1 tab po daily  and a 2nd tab po daily prn increased edema, wt gain>3 #/24 hour 100 tablet 5  . glucose blood (ONE TOUCH TEST STRIPS) test strip Use as instructed to check blood sugar once weekly. DX 790.29     .  loratadine (CLARITIN) 10 MG tablet Take 1 tablet (10 mg total) by mouth at bedtime. 30 tablet 5  . metolazone (ZAROXOLYN) 2.5 MG tablet 1 tab po daily 1/2 hour prior to Lasix prn pedal edema, increasing SOB, weight gain >3# 20 tablet 1  . metoprolol succinate (TOPROL-XL) 50 MG 24 hr tablet TAKE 1 AND 1/2 TABLETS EVERY DAY 135 tablet 3  . montelukast (SINGULAIR) 10 MG tablet Take 1 tablet (10 mg total) by mouth at bedtime as needed. 30 tablet 3  . OXYGEN Inhale into the lungs.    . potassium chloride SA (K-DUR,KLOR-CON) 20 MEQ tablet Take 1 tablet (20 mEq total) by mouth daily. And a 2nd tab po daily prn extra Furosemide tab 100 tablet 5  . pravastatin (PRAVACHOL) 10 MG tablet Take 1 tablet (10 mg total) by mouth daily. 90 tablet 1  . Probiotic Product (PROBIOTIC PO) Take by mouth daily.    . ranitidine (ZANTAC) 150 MG tablet TAKE 1 TABLET TWICE DAILY 180 tablet 1  . sertraline (ZOLOFT) 100 MG tablet TAKE 1 TABLET (100 MG TOTAL) BY MOUTH DAILY. 90 tablet 3  . SPIRIVA HANDIHALER 18 MCG inhalation capsule PLACE 1 CAPSULE (18 MCG TOTAL) INTO INHALER AND INHALE CONTENTS ONE TIME DAILY. 90 capsule 1  . traMADol (ULTRAM) 50 MG tablet TAKE 1 TABLET BY MOUTH THREE TIMES DAILY AS NEEDED 90 tablet 0  . Niacin (VITAMIN B-3 PO) Take 1 tablet by mouth daily.     No facility-administered medications prior to visit.     Allergies  Allergen Reactions  . Oseltamivir Phosphate     TAMIFLU REACTION: nausea, vomiting, diarrhea, dizziness  . Sulfa Antibiotics Other (See Comments)    CP  . Ace Inhibitors   . Indomethacin   . Montelukast Sodium     REACTION: Heart palpitations, chest pain  . Sulfonamide Derivatives     Review of Systems  Constitutional: Positive for malaise/fatigue. Negative for fever.  HENT:  Negative for congestion.   Eyes: Negative for blurred vision.  Respiratory: Negative for shortness of breath.   Cardiovascular: Negative for chest pain, palpitations and leg swelling.  Gastrointestinal: Negative for abdominal pain, blood in stool and nausea.  Genitourinary: Negative for dysuria and frequency.  Musculoskeletal: Positive for joint pain. Negative for falls.  Skin: Negative for rash.  Neurological: Negative for dizziness, loss of consciousness and headaches.  Endo/Heme/Allergies: Negative for environmental allergies.  Psychiatric/Behavioral: Negative for depression. The patient is not nervous/anxious.        Objective:    Physical Exam  Constitutional: She is oriented to person, place, and time. She appears well-developed and well-nourished. No distress.  HENT:  Head: Normocephalic and atraumatic.  Eyes: Conjunctivae are normal.  Neck: Neck supple. No thyromegaly present.  Cardiovascular: Normal rate, regular rhythm and normal heart sounds.  No murmur heard. Pulmonary/Chest: Effort normal and breath sounds normal. No respiratory distress.  Abdominal: Soft. Bowel sounds are normal. She exhibits no distension and no mass. There is no tenderness.  Musculoskeletal: She exhibits no edema.  Lymphadenopathy:    She has no cervical adenopathy.  Neurological: She is alert and oriented to person, place, and time.  Skin: Skin is warm and dry.  Psychiatric: She has a normal mood and affect. Her behavior is normal.    BP (!) 104/58   Pulse 68   Temp 97.7 F (36.5 C) (Oral)   Resp 18   Wt 192 lb 12.8 oz (87.5 kg)   SpO2 97%   BMI 32.12  kg/m  Wt Readings from Last 3 Encounters:  06/16/17 192 lb 12.8 oz (87.5 kg)  03/19/17 192 lb 3.2 oz (87.2 kg)  02/05/17 196 lb 3.2 oz (89 kg)   BP Readings from Last 3 Encounters:  06/21/17 (!) 104/58  03/19/17 132/68  02/05/17 132/63     Immunization History  Administered Date(s) Administered  . Influenza Split 12/08/2011  .  Influenza Whole 10/20/2006, 11/09/2007, 11/14/2008, 10/30/2009  . Influenza, High Dose Seasonal PF 09/25/2015, 10/21/2015  . Influenza,inj,Quad PF,6+ Mos 10/20/2012, 11/28/2013, 10/02/2014, 09/18/2016  . Pneumococcal Conjugate-13 02/21/2013  . Pneumococcal Polysaccharide-23 06/30/2006  . Td 06/01/2007    Health Maintenance  Topic Date Due  . URINE MICROALBUMIN  03/01/2015  . OPHTHALMOLOGY EXAM  10/08/2015  . FOOT EXAM  04/09/2016  . TETANUS/TDAP  05/31/2017  . INFLUENZA VACCINE  08/20/2017  . HEMOGLOBIN A1C  12/17/2017  . COLONOSCOPY  11/10/2018  . DEXA SCAN  Completed  . PNA vac Low Risk Adult  Completed    Lab Results  Component Value Date   WBC 6.1 06/16/2017   HGB 11.3 (L) 06/16/2017   HCT 34.4 (L) 06/16/2017   PLT 133.0 (L) 06/16/2017   GLUCOSE 96 06/16/2017   CHOL 157 06/16/2017   TRIG 247.0 (H) 06/16/2017   HDL 45.90 06/16/2017   LDLDIRECT 73.0 06/16/2017   LDLCALC 69 03/19/2017   ALT 12 06/16/2017   AST 14 06/16/2017   NA 141 06/16/2017   K 5.3 (H) 06/16/2017   CL 100 06/16/2017   CREATININE 2.08 (H) 06/16/2017   BUN 59 (H) 06/16/2017   CO2 32 06/16/2017   TSH 3.45 06/16/2017   INR 1.0 09/12/2006   HGBA1C 5.6 06/16/2017   MICROALBUR 0.8 02/28/2014    Lab Results  Component Value Date   TSH 3.45 06/16/2017   Lab Results  Component Value Date   WBC 6.1 06/16/2017   HGB 11.3 (L) 06/16/2017   HCT 34.4 (L) 06/16/2017   MCV 99.7 06/16/2017   PLT 133.0 (L) 06/16/2017   Lab Results  Component Value Date   NA 141 06/16/2017   K 5.3 (H) 06/16/2017   CO2 32 06/16/2017   GLUCOSE 96 06/16/2017   BUN 59 (H) 06/16/2017   CREATININE 2.08 (H) 06/16/2017   BILITOT 0.5 06/16/2017   ALKPHOS 48 06/16/2017   AST 14 06/16/2017   ALT 12 06/16/2017   PROT 7.1 06/16/2017   ALBUMIN 4.4 06/16/2017   CALCIUM 9.8 06/16/2017   ANIONGAP 7 02/14/2016   GFR 24.59 (L) 06/16/2017   Lab Results  Component Value Date   CHOL 157 06/16/2017   Lab Results  Component  Value Date   HDL 45.90 06/16/2017   Lab Results  Component Value Date   LDLCALC 69 03/19/2017   Lab Results  Component Value Date   TRIG 247.0 (H) 06/16/2017   Lab Results  Component Value Date   CHOLHDL 3 06/16/2017   Lab Results  Component Value Date   HGBA1C 5.6 06/16/2017         Assessment & Plan:   Problem List Items Addressed This Visit    Essential hypertension    Well controlled, no changes to meds. Encouraged heart healthy diet such as the DASH diet and exercise as tolerated.       Diabetes type 2, controlled ([place])    hgba1c acceptable, minimize simple carbs. Increase exercise as tolerated.       Hyperlipidemia, mixed    Encouraged heart healthy diet, increase exercise, avoid  trans fats, consider a krill oil cap daily      Relevant Orders   Lipid panel (Completed)   Tachycardia    rrr today      Relevant Orders   CBC (Completed)   Comprehensive metabolic panel (Completed)   TSH (Completed)   Constipation    Encouraged increased hydration and fiber in diet. Daily probiotics. If bowels not moving can use MOM 2 tbls po in 4 oz of warm prune juice by mouth every 2-3 days. If no results then repeat in 4 hours with  Dulcolax suppository pr, may repeat again in 4 more hours as needed. Seek care if symptoms worsen. Consider daily Miralax and/or Dulcolax if symptoms persist.       Anemia    Was feeling better on IV iron given by her nephrologist is referred to hematology here in the building so she can be reevaluated and receive more consistent treatments.       Relevant Orders   Ambulatory referral to Hematology   Polymyalgia rheumatica ([place])    Doing much better      Chronic bronchitis ([place])    Generally doing well on oxygen at 2 liters       Renal insufficiency    Hydrated well today check cmp today      Abnormal TSH    Check tsh      RESOLVED: Hyperglycemia - Primary    hgba1c acceptable, minimize simple carbs. Increase exercise as  tolerated.       Relevant Orders   Hemoglobin A1c (Completed)   Vitamin D deficiency    Take daily supplements, check level      Relevant Orders   VITAMIN D 25 Hydroxy (Vit-D Deficiency, Fractures) (Completed)      I have discontinued Charlynne Cousins. Callaham's Niacin (VITAMIN B-3 PO). I am also having her maintain her glucose blood, acetaminophen, Cyanocobalamin (VITAMIN B-12 PO), OXYGEN, b complex vitamins, Probiotic Product (PROBIOTIC PO), aspirin EC, montelukast, SPIRIVA HANDIHALER, loratadine, fluticasone, metolazone, sertraline, potassium chloride SA, Ferrous Fumarate-Folic Acid, furosemide, cholecalciferol, pravastatin, albuterol, allopurinol, ranitidine, metoprolol succinate, albuterol, budesonide-formoterol, and traMADol.  No orders of the defined types were placed in this encounter.   CMA served as Education administrator during this visit. History, Physical and Plan performed by medical provider. Documentation and orders reviewed and attested to.  Penni Homans, MD

## 2017-06-16 NOTE — Assessment & Plan Note (Signed)
Hydrated well today check cmp today

## 2017-06-17 LAB — COMPREHENSIVE METABOLIC PANEL
ALBUMIN: 4.4 g/dL (ref 3.5–5.2)
ALT: 12 U/L (ref 0–35)
AST: 14 U/L (ref 0–37)
Alkaline Phosphatase: 48 U/L (ref 39–117)
BUN: 59 mg/dL — AB (ref 6–23)
CHLORIDE: 100 meq/L (ref 96–112)
CO2: 32 mEq/L (ref 19–32)
Calcium: 9.8 mg/dL (ref 8.4–10.5)
Creatinine, Ser: 2.08 mg/dL — ABNORMAL HIGH (ref 0.40–1.20)
GFR: 24.59 mL/min — ABNORMAL LOW (ref >60.00)
GLUCOSE: 96 mg/dL (ref 70–99)
POTASSIUM: 5.3 meq/L — AB (ref 3.5–5.1)
SODIUM: 141 meq/L (ref 135–145)
Total Bilirubin: 0.5 mg/dL (ref 0.2–1.2)
Total Protein: 7.1 g/dL (ref 6.0–8.3)

## 2017-06-17 LAB — VITAMIN D 25 HYDROXY (VIT D DEFICIENCY, FRACTURES): VITD: 42.67 ng/mL (ref 30.00–100.00)

## 2017-06-17 LAB — TSH: TSH: 3.45 u[IU]/mL (ref 0.35–4.50)

## 2017-06-17 LAB — LDL CHOLESTEROL, DIRECT: Direct LDL: 73 mg/dL

## 2017-06-17 LAB — LIPID PANEL
CHOL/HDL RATIO: 3
CHOLESTEROL: 157 mg/dL (ref 0–200)
HDL: 45.9 mg/dL (ref >39.00)
NonHDL: 110.72
Triglycerides: 247 mg/dL — ABNORMAL HIGH (ref 0.0–149.0)
VLDL: 49.4 mg/dL — AB (ref 0.0–40.0)

## 2017-06-17 LAB — HEMOGLOBIN A1C: Hgb A1c MFr Bld: 5.6 % (ref 4.6–6.5)

## 2017-06-17 LAB — CBC
HEMATOCRIT: 34.4 % — AB (ref 36.0–46.0)
Hemoglobin: 11.3 g/dL — ABNORMAL LOW (ref 12.0–15.0)
MCHC: 33 g/dL (ref 30.0–36.0)
MCV: 99.7 fl (ref 78.0–100.0)
Platelets: 133 10*3/uL — ABNORMAL LOW (ref 150.0–400.0)
RBC: 3.45 Mil/uL — ABNORMAL LOW (ref 3.87–5.11)
RDW: 14 % (ref 11.5–15.5)
WBC: 6.1 10*3/uL (ref 4.0–10.5)

## 2017-06-21 NOTE — Assessment & Plan Note (Signed)
Well controlled, no changes to meds. Encouraged heart healthy diet such as the DASH diet and exercise as tolerated.  °

## 2017-06-21 NOTE — Assessment & Plan Note (Signed)
Take daily supplements, check level

## 2017-06-21 NOTE — Assessment & Plan Note (Signed)
hgba1c acceptable, minimize simple carbs. Increase exercise as tolerated.  

## 2017-06-25 MED FILL — FUROSEMIDE 40 MG TAB: 40 | 50 days supply | Qty: 100 | Fill #3

## 2017-06-30 ENCOUNTER — Telehealth: Payer: Self-pay | Admitting: Family Medicine

## 2017-06-30 NOTE — Telephone Encounter (Signed)
Copied from [place] [phone]. Topic: Quick Communication - See Telephone Encounter >> Jun 30, 2017  4:01 PM Rosalin Hawking wrote: CRM for notification. See Telephone encounter for: 06/30/17.    Pt dropped off document to be filled out by provider (Disability Parking Placard - 1 page) Pt would like document to be mailed to pts address ([address], [place], [place] [address]) Document put at front office tray under providers name.

## 2017-07-02 NOTE — Telephone Encounter (Signed)
Completed Physician section on Disability Parking Placard Application; forwarded to provider/SLS 06/13

## 2017-07-06 DIAGNOSIS — H26491 Other secondary cataract, right eye: Secondary | ICD-10-CM | POA: Diagnosis not present

## 2017-07-06 DIAGNOSIS — Z961 Presence of intraocular lens: Secondary | ICD-10-CM | POA: Diagnosis not present

## 2017-07-06 DIAGNOSIS — H11153 Pinguecula, bilateral: Secondary | ICD-10-CM | POA: Diagnosis not present

## 2017-07-06 DIAGNOSIS — H0100B Unspecified blepharitis left eye, upper and lower eyelids: Secondary | ICD-10-CM | POA: Diagnosis not present

## 2017-07-06 DIAGNOSIS — H353131 Nonexudative age-related macular degeneration, bilateral, early dry stage: Secondary | ICD-10-CM | POA: Diagnosis not present

## 2017-07-06 DIAGNOSIS — H0100A Unspecified blepharitis right eye, upper and lower eyelids: Secondary | ICD-10-CM | POA: Diagnosis not present

## 2017-07-06 DIAGNOSIS — H18413 Arcus senilis, bilateral: Secondary | ICD-10-CM | POA: Diagnosis not present

## 2017-07-06 DIAGNOSIS — H04123 Dry eye syndrome of bilateral lacrimal glands: Secondary | ICD-10-CM | POA: Diagnosis not present

## 2017-07-06 DIAGNOSIS — H11423 Conjunctival edema, bilateral: Secondary | ICD-10-CM | POA: Diagnosis not present

## 2017-07-06 NOTE — Telephone Encounter (Signed)
Received completed Application and Renewal of Disability Parking Placard form [place] [employer] from provider; mailed to patient/SLS 06/17

## 2017-07-14 ENCOUNTER — Ambulatory Visit: Payer: 59 | Admitting: Cardiology

## 2017-07-14 ENCOUNTER — Encounter: Payer: Self-pay | Admitting: Cardiology

## 2017-07-14 ENCOUNTER — Encounter (INDEPENDENT_AMBULATORY_CARE_PROVIDER_SITE_OTHER): Payer: Self-pay

## 2017-07-14 VITALS — BP 128/60 | HR 61 | Ht 64.95 in | Wt 197.2 lb

## 2017-07-14 DIAGNOSIS — I10 Essential (primary) hypertension: Secondary | ICD-10-CM | POA: Diagnosis not present

## 2017-07-14 DIAGNOSIS — I739 Peripheral vascular disease, unspecified: Secondary | ICD-10-CM

## 2017-07-14 DIAGNOSIS — N183 Chronic kidney disease, stage 3 unspecified: Secondary | ICD-10-CM

## 2017-07-14 DIAGNOSIS — I5032 Chronic diastolic (congestive) heart failure: Secondary | ICD-10-CM | POA: Diagnosis not present

## 2017-07-14 NOTE — Patient Instructions (Signed)
Medication Instructions:  Your physician has recommended you make the following change in your medication:  INCREASE: Lasix to 40 mg two times a day for 3 days, THEN take Lasix 40 mg once a day in the morning   If you need a refill on your cardiac medications, please contact your pharmacy first.  Labwork: Today for kidney function test (BMET) Your physician recommends that you return for lab work in: 1 week for repeat BMET   Testing/Procedures: None ordered   Follow-Up: Your physician recommends that you schedule a follow-up appointment in: 6 weeks with PA on Dr. Theodosia Blender team   Your physician wants you to follow-up in: 6 months with Dr. Radford Pax. You will receive a reminder letter in the mail two months in advance. If you don't receive a letter, please call our office to schedule the follow-up appointment.  Any Other Special Instructions Will Be Listed Below (If Applicable).   Thank you for choosing [facility], RN  [phone]  If you need a refill on your cardiac medications before your next appointment, please call your pharmacy.

## 2017-07-14 NOTE — Progress Notes (Signed)
Cardiology Office Note:    Date:  07/14/2017   ID:  [name], DOB November 19, 1940, MRN [id number]  PCP:  Mosie Lukes, MD  Cardiologist:  No primary care provider on file.    Referring MD: Mosie Lukes, MD   Chief Complaint  Patient presents with  . Congestive Heart Failure  . Hypertension  . Hyperlipidemia    History of Present Illness:    [name] is a 77 y.o. female with a hx of asthma, atypical CP with normal coronary arteries by cath in 2008 in the setting of NSTEMI complicated by right groin pseudoaneurysm, chronic diastolic CHF, GERD, HTN and hyperlipidemia. 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  Last echo showed normal LVF with mild MR and grade I diastolic dysfunction.   She is here today for followup and is doing well.  She denies any chest pain or pressure, PND,   palpitations or syncope.  She does have occasional dizziness when going from sitting to standing but is not passed out.  She has chronic shortness of breath and is on home O2.  Her breathing though she says is stable and has not worsened any recently.  She has chronic lower extremity edema and unfortunately has had worsening edema recently and has gained 6 pounds over the past couple of days.  She is compliant with her meds and is tolerating meds with no SE.    Past Medical History:  Diagnosis Date  . Abnormal glucose tolerance test   . Abnormal TSH 09/22/2016  . ACE-inhibitor cough   . Anemia 03/12/2014  . Arthritis   . Asthma    PFT 02/06/09 FEV1 1.41 (65%), FVC 1.92 (64), FEV1% 74, TLC 3.47 (71%), DLCO 48%, +BD  . Atypical chest pain    s/p cath, Normal coronaries, Non ST elevation myocardial infarction, Rt groin pseudoaneurysm  . Bacterial vaginosis 03/12/2014  . Cellulitis 06/22/2016  . Chronic kidney disease (CKD), stage III (moderate) ([place]) 08/04/2016  . COPD (chronic obstructive pulmonary disease) ([place])   . Depression   . Diastolic heart failure ([place])  04/07/2016  . DVT (deep venous thrombosis) ([place]) 1987  . Fall at home 12/04/2014  . GERD (gastroesophageal reflux disease)   . Gout   . Hypercalcemia 10/15/2014  . Hyperlipidemia   . Hypertension   . Hypoxia 10/15/2014  . Macular degeneration 04/10/2015  . Osteopenia 12/29/2006   Qualifier: Diagnosis of  By: Wynona Luna   . Pneumonia   . 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 Renal insufficiency 09/22/2016  . Sun-damaged skin 10/24/2012  . Unspecified constipation 06/05/2013  . Urinary frequency 10/24/2012  . UTI (lower urinary tract infection) 10/24/2012  . Vitamin D deficiency 01/01/2015    Past Surgical History:  Procedure Laterality Date  . APPENDECTOMY  1951  . TONSILLECTOMY  1950  . TUBAL LIGATION  1968    Current Medications: Current Meds  Medication Sig  . acetaminophen (TYLENOL) 500 MG tablet Take 500 mg by mouth every 6 (six) hours as needed.  Marland Kitchen albuterol (ACCUNEB) 0.63 MG/3ML nebulizer solution Take 3 mLs (0.63 mg total) by nebulization every 6 (six) hours as needed for wheezing or shortness of breath.  Marland Kitchen albuterol (PROVENTIL HFA;VENTOLIN HFA) 108 (90 Base) MCG/ACT inhaler INHALE 2 PUFFS INTO THE LUNGS 4 (FOUR) TIMES DAILY AS NEEDED FOR WHEEZING OR SHORTNESS OF BREATH.  Marland Kitchen allopurinol (ZYLOPRIM) 100 MG tablet TAKE 2 TABLETS ONE TIME  DAILY  . aspirin EC 81 MG tablet Take 1 tablet (81 mg total) by mouth daily.  Marland Kitchen b complex vitamins tablet Take 1 tablet by mouth daily.  . budesonide-formoterol (SYMBICORT) 160-4.5 MCG/ACT inhaler INHALE 2 PUFFS INTO THE LUNGS 2 TIMES DAILY  . cholecalciferol (VITAMIN D) 1000 units tablet Take 1,000 Units daily by mouth.  . Cyanocobalamin (VITAMIN B-12 PO) Take by mouth daily.  . Ferrous Fumarate-Folic Acid (HEMOCYTE-F) 324-1 MG TABS Take 1 tablet by mouth daily.  . fluticasone (FLONASE) 50 MCG/ACT nasal spray Place 1 spray into both nostrils daily.  . furosemide (LASIX) 40 MG tablet 1 tab po daily and a 2nd tab po daily prn increased  edema, wt gain>3 #/24 hour  . glucose blood (ONE TOUCH TEST STRIPS) test strip Use as instructed to check blood sugar once weekly. DX 790.29   . loratadine (CLARITIN) 10 MG tablet Take 1 tablet (10 mg total) by mouth at bedtime.  . metolazone (ZAROXOLYN) 2.5 MG tablet 1 tab po daily 1/2 hour prior to Lasix prn pedal edema, increasing SOB, weight gain >3#  . metoprolol succinate (TOPROL-XL) 50 MG 24 hr tablet TAKE 1 AND 1/2 TABLETS EVERY DAY  . montelukast (SINGULAIR) 10 MG tablet Take 1 tablet (10 mg total) by mouth at bedtime as needed.  . OXYGEN Inhale into the lungs.  . potassium chloride SA (K-DUR,KLOR-CON) 20 MEQ tablet Take 1 tablet (20 mEq total) by mouth daily. And a 2nd tab po daily prn extra Furosemide tab  . pravastatin (PRAVACHOL) 10 MG tablet Take 1 tablet (10 mg total) by mouth daily.  . Probiotic Product (PROBIOTIC PO) Take by mouth daily.  . ranitidine (ZANTAC) 150 MG tablet TAKE 1 TABLET TWICE DAILY  . sertraline (ZOLOFT) 100 MG tablet TAKE 1 TABLET (100 MG TOTAL) BY MOUTH DAILY.  Marland Kitchen SPIRIVA HANDIHALER 18 MCG inhalation capsule PLACE 1 CAPSULE (18 MCG TOTAL) INTO INHALER AND INHALE CONTENTS ONE TIME DAILY.  . traMADol (ULTRAM) 50 MG tablet TAKE 1 TABLET BY MOUTH THREE TIMES DAILY AS NEEDED     Allergies:   Oseltamivir phosphate; Sulfa antibiotics; Ace inhibitors; Indomethacin; Montelukast sodium; and Sulfonamide derivatives   Social History   Socioeconomic History  . Marital status: Widowed    Spouse name: Not on file  . Number of children: 1  . Years of education: Not on file  . Highest education level: Not on file  Occupational History  . 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 Financial resource strain: Not on file  . Food insecurity:    Worry: Not on file    Inability: Not on file  . Transportation needs:    Medical: Not on file    Non-medical: Not on file  Tobacco Use  . Smoking status: Never Smoker  . Smokeless tobacco: Never Used    Substance and Sexual Activity  . Alcohol use: No  . Drug use: No  . Sexual activity: Never    Comment: lives alone, no dietary restrictions  Lifestyle  . Physical activity:    Days per week: Not on file    Minutes per session: Not on file  . Stress: Not on file  Relationships  . Social connections:    Talks on phone: Not on file    Gets together: Not on file    Attends religious service: Not on file    Active member of club or organization: Not on file    Attends meetings of clubs or  organizations: Not on file    Relationship status: Not on file  Other Topics Concern  . Not on file  Social History Narrative  . Not on file     Family History: The patient's family history includes Alcohol abuse in her father; Arthritis in her brother, sister, sister, and sister; Asthma in her sister; COPD in her brother and sister; Colon polyps in her sister; Coronary artery disease in her brother and other; Emphysema in her brother and sister; Epilepsy in her daughter; Heart attack in her brother and mother; Heart disease in her brother, mother, and sister; Hyperlipidemia in her mother, sister, sister, and sister; Hypertension in her daughter, sister, sister, and sister; Lung cancer in her brother and brother; Mental illness in her father; Obesity in her daughter; Suicidality in her father; Uterine cancer in her sister.  ROS:   Please see the history of present illness.    ROS  All other systems reviewed and negative.   EKGs/Labs/Other Studies Reviewed:    The following studies were reviewed today: none  EKG:  EKG is  ordered today and showed NSR at 61bpm with PVC  Recent Labs: 06/16/2017: ALT 12; BUN 59; Creatinine, Ser 2.08; Hemoglobin 11.3; Platelets 133.0; Potassium 5.3; Sodium 141; TSH 3.45   Recent Lipid Panel    Component Value Date/Time   CHOL 157 06/16/2017 1543   TRIG 247.0 (H) 06/16/2017 1543   TRIG 122 01/27/2006 1037   HDL 45.90 06/16/2017 1543   CHOLHDL 3 06/16/2017 1543    VLDL 49.4 (H) 06/16/2017 1543   LDLCALC 69 03/19/2017 1505   LDLDIRECT 73.0 06/16/2017 1543    Physical Exam:    VS:  BP 128/60   Pulse 61   Ht 5' 4.95" (1.65 m)   Wt 197 lb 3.2 oz (89.4 kg)   BMI 32.87 kg/m     Wt Readings from Last 3 Encounters:  07/14/17 197 lb 3.2 oz (89.4 kg)  06/16/17 192 lb 12.8 oz (87.5 kg)  03/19/17 192 lb 3.2 oz (87.2 kg)     GEN:  Well nourished, well developed in no acute distress HEENT: Normal NECK: No JVD; No carotid bruits LYMPHATICS: No lymphadenopathy CARDIAC: RRR, no murmurs, rubs, gallops.  RESPIRATORY:  Clear to auscultation without rales, wheezing or rhonchi  ABDOMEN: Soft, non-tender, non-distended MUSCULOSKELETAL: Chronic venous stasis with chronic brawny edema; No deformity  SKIN: Warm and dry NEUROLOGIC:  Alert and oriented x 3 PSYCHIATRIC:  Normal affect   ASSESSMENT:    1. Chronic diastolic CHF (congestive heart failure) ([place])   2. Essential hypertension   3. Peripheral arterial disease ([place])   4. Chronic kidney disease (CKD), stage III (moderate) ([facility])    PLAN:    In order of problems listed above:  1.  Chronic diastolic CHF -she appears volume overloaded and weight is up 6 lbs from May.  I am going to increase her Lasix to 40 mg twice daily for 3 days and then go back to Lasix 40 mg daily with additional 40 mg as needed for weight greater than 3 pounds in 24 hours or increased edema.  I instructed her to call if she does not start losing weight or if the edema gets worse.  I will check a be met today and then another be met in 1 week.  We will have her follow-up with my extender in 6 weeks and I will see her in 6 months.  2.  HTN -BP is well controlled on exam today.  She will continue on Toprol-XL 75 mg daily.  3.  PAD -followed by Dr. Gwenlyn Found with normal lower extremity Dopplers a year ago and ABIs greater than 1 bilaterally.  4.  CKD stage III -followed by PCP.  Her last creatinine was 2.08 on 06/16/2017 and potassium  was 5.3.   Medication Adjustments/Labs and Tests Ordered: Current medicines are reviewed at length with the patient today.  Concerns regarding medicines are outlined above.  Orders Placed This Encounter  Procedures  . EKG 12-Lead   No orders of the defined types were placed in this encounter.   Signed, Fransico Him, MD  07/14/2017 1:51 PM    [place]

## 2017-07-15 ENCOUNTER — Other Ambulatory Visit: Payer: Self-pay | Admitting: Family

## 2017-07-15 DIAGNOSIS — D649 Anemia, unspecified: Secondary | ICD-10-CM

## 2017-07-15 LAB — BASIC METABOLIC PANEL
BUN / CREAT RATIO: 29 — AB (ref 12–28)
BUN: 52 mg/dL — AB (ref 8–27)
CALCIUM: 9.9 mg/dL (ref 8.7–10.3)
CHLORIDE: 102 mmol/L (ref 96–106)
CO2: 27 mmol/L (ref 20–29)
CREATININE: 1.77 mg/dL — AB (ref 0.57–1.00)
GFR calc Af Amer: 32 mL/min/{1.73_m2} — ABNORMAL LOW (ref >59)
GFR calc non Af Amer: 28 mL/min/{1.73_m2} — ABNORMAL LOW (ref >59)
GLUCOSE: 78 mg/dL (ref 65–99)
Potassium: 5.2 mmol/L (ref 3.5–5.2)
Sodium: 142 mmol/L (ref 134–144)

## 2017-07-16 ENCOUNTER — Other Ambulatory Visit: Payer: Self-pay

## 2017-07-16 ENCOUNTER — Inpatient Hospital Stay: Payer: Medicare HMO | Attending: Family | Admitting: Family

## 2017-07-16 ENCOUNTER — Encounter: Payer: Self-pay | Admitting: Family

## 2017-07-16 ENCOUNTER — Inpatient Hospital Stay: Payer: Medicare HMO

## 2017-07-16 ENCOUNTER — Other Ambulatory Visit: Payer: Self-pay | Admitting: Family Medicine

## 2017-07-16 VITALS — BP 107/32 | HR 65 | Temp 98.3°F | Resp 19 | Wt 196.0 lb

## 2017-07-16 DIAGNOSIS — D631 Anemia in chronic kidney disease: Secondary | ICD-10-CM | POA: Diagnosis not present

## 2017-07-16 DIAGNOSIS — N183 Chronic kidney disease, stage 3 (moderate): Secondary | ICD-10-CM | POA: Insufficient documentation

## 2017-07-16 DIAGNOSIS — D649 Anemia, unspecified: Secondary | ICD-10-CM

## 2017-07-16 DIAGNOSIS — D509 Iron deficiency anemia, unspecified: Secondary | ICD-10-CM | POA: Insufficient documentation

## 2017-07-16 DIAGNOSIS — I130 Hypertensive heart and chronic kidney disease with heart failure and stage 1 through stage 4 chronic kidney disease, or unspecified chronic kidney disease: Secondary | ICD-10-CM | POA: Insufficient documentation

## 2017-07-16 LAB — CBC WITH DIFFERENTIAL (CANCER CENTER ONLY)
Basophils Absolute: 0 10*3/uL (ref 0.0–0.1)
Basophils Relative: 1 %
Eosinophils Absolute: 0.3 10*3/uL (ref 0.0–0.5)
Eosinophils Relative: 6 %
HEMATOCRIT: 33.8 % — AB (ref 34.8–46.6)
Hemoglobin: 10.6 g/dL — ABNORMAL LOW (ref 11.6–15.9)
LYMPHS ABS: 0.9 10*3/uL (ref 0.9–3.3)
LYMPHS PCT: 17 %
MCH: 32.5 pg (ref 26.0–34.0)
MCHC: 31.4 g/dL — ABNORMAL LOW (ref 32.0–36.0)
MCV: 103.7 fL — AB (ref 81.0–101.0)
MONO ABS: 0.7 10*3/uL (ref 0.1–0.9)
MONOS PCT: 13 %
NEUTROS ABS: 3.5 10*3/uL (ref 1.5–6.5)
Neutrophils Relative %: 63 %
Platelet Count: 109 10*3/uL — ABNORMAL LOW (ref 145–400)
RBC: 3.26 MIL/uL — ABNORMAL LOW (ref 3.70–5.32)
RDW: 13.1 % (ref 11.1–15.7)
WBC Count: 5.5 10*3/uL (ref 3.9–10.0)

## 2017-07-16 LAB — IRON AND TIBC
Iron: 86 ug/dL (ref 28–170)
SATURATION RATIOS: 27 % (ref 10.4–31.8)
TIBC: 312 ug/dL (ref 250–450)
UIBC: 228 ug/dL

## 2017-07-16 LAB — CMP (CANCER CENTER ONLY)
ALT: 20 U/L (ref 10–47)
AST: 20 U/L (ref 11–38)
Albumin: 3.9 g/dL (ref 3.5–5.0)
Alkaline Phosphatase: 49 U/L (ref 26–84)
Anion gap: 13 (ref 5–15)
BUN: 49 mg/dL — ABNORMAL HIGH (ref 7–22)
CALCIUM: 9.4 mg/dL (ref 8.0–10.3)
CHLORIDE: 101 mmol/L (ref 98–108)
CO2: 31 mmol/L (ref 18–33)
CREATININE: 2 mg/dL — AB (ref 0.60–1.20)
GLUCOSE: 99 mg/dL (ref 73–118)
POTASSIUM: 4.3 mmol/L (ref 3.3–4.7)
SODIUM: 145 mmol/L (ref 128–145)
Total Bilirubin: 0.7 mg/dL (ref 0.2–1.6)
Total Protein: 7 g/dL (ref 6.4–8.1)

## 2017-07-16 LAB — FERRITIN: FERRITIN: 285 ng/mL (ref 11–307)

## 2017-07-16 LAB — VITAMIN B12: Vitamin B-12: 1379 pg/mL — ABNORMAL HIGH (ref 180–914)

## 2017-07-16 LAB — SAVE SMEAR

## 2017-07-16 NOTE — Progress Notes (Signed)
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 She has history of iron deficiency and has had 1 dose of IV a year or 2 ago. She is currently taking an oral iron supplement. She takes Zantac daily which may block the absorption of oral iron.  Hgb is 10.6 with an MCV of 103. Platelets 109.  She is symptomatic with fatigue, chills and occasional headaches and dizziness.  She has had no falls or syncopal episodes.  No episodes of bleeding, no bruising or petechiae.  Her sister also has history of anemia.  She denies personal history of cancer. Family history includes: sister - uterine and sister - unknowns primary, possibly lung.  She has been on 2L [place] supplemental O2 24 hours a day for the last 5 years secondary to chronic bronchitis and persistent asthma. Her SOB is stable.  No fever, n/v, cough, rash, chest pain, palpitations or changes in bowel or bladder habits.  She will take a stool softener as needed for constipation.  She has lower abdominal pain on both side sometimes after she eats. She does not feel that this is associated with certain foods.  She has PAD with swelling with +2 pitting edema in her lower extremities. No numbness or tingling in her extremities.  She has intermittent pain associated with osteo arthritis. This is unchanged for her from baseline.  She had a colonoscopy in October 2015 and had 2 benign polyps removed.  Mammogram in March was negative.  She has maintained a good appetite and is staying well hydrated. Her weight is stable.  She has never been a smoker and does not drink alcoholic beverages.  She is retired  and used to work in a Administrator.   ROS: All other 10 point review of systems is negative.   PAST MEDICAL HISTORY:   Past Medical History:  Diagnosis Date  . Abnormal glucose tolerance test   . Abnormal TSH 09/22/2016  . ACE-inhibitor cough   . Anemia 03/12/2014  . Arthritis   . Asthma    PFT 02/06/09 FEV1 1.41 (65%), FVC 1.92 (64), FEV1% 74, TLC 3.47 (71%), DLCO 48%, +BD  . Atypical chest pain    s/p cath, Normal coronaries, Non ST elevation myocardial infarction, Rt groin pseudoaneurysm  . Bacterial vaginosis 03/12/2014  . Cellulitis 06/22/2016  . Chronic kidney disease (CKD), stage III (moderate) ([place]) 08/04/2016  . COPD (chronic obstructive pulmonary disease) ([place])   . Depression   . Diastolic heart failure ([place]) 04/07/2016  . DVT (deep venous thrombosis) ([place]) 1987  . Fall at home 12/04/2014  . GERD (gastroesophageal reflux disease)   . Gout   . Hypercalcemia 10/15/2014  . Hyperlipidemia   . Hypertension   . Hypoxia 10/15/2014  . Macular degeneration 04/10/2015  . Osteopenia 12/29/2006   Qualifier: Diagnosis of  By: Wynona Luna   . Pneumonia   . 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 Renal insufficiency 09/22/2016  . Sun-damaged skin 10/24/2012  . Unspecified constipation 06/05/2013  . Urinary frequency 10/24/2012  . UTI (lower urinary tract infection) 10/24/2012  . Vitamin D deficiency 01/01/2015  ALLERGIES: Allergies  Allergen Reactions  . Oseltamivir Phosphate     TAMIFLU REACTION: nausea, vomiting, diarrhea, dizziness  . Sulfa Antibiotics Other (See Comments)    CP  . Ace Inhibitors   . Indomethacin   . Montelukast Sodium     REACTION: Heart palpitations, chest pain  . Sulfonamide Derivatives       MEDICATIONS:  Current Outpatient Medications on File Prior to Visit  Medication Sig Dispense Refill  . acetaminophen (TYLENOL) 500 MG tablet Take 500 mg by mouth every 6 (six) hours as needed.    Marland Kitchen albuterol (ACCUNEB) 0.63 MG/3ML nebulizer  solution Take 3 mLs (0.63 mg total) by nebulization every 6 (six) hours as needed for wheezing or shortness of breath. 150 mL 0  . albuterol (PROVENTIL HFA;VENTOLIN HFA) 108 (90 Base) MCG/ACT inhaler INHALE 2 PUFFS INTO THE LUNGS 4 (FOUR) TIMES DAILY AS NEEDED FOR WHEEZING OR SHORTNESS OF BREATH. 54 g 0  . allopurinol (ZYLOPRIM) 100 MG tablet TAKE 2 TABLETS ONE TIME DAILY 180 tablet 1  . aspirin EC 81 MG tablet Take 1 tablet (81 mg total) by mouth daily.    Marland Kitchen b complex vitamins tablet Take 1 tablet by mouth daily.    . budesonide-formoterol (SYMBICORT) 160-4.5 MCG/ACT inhaler INHALE 2 PUFFS INTO THE LUNGS 2 TIMES DAILY 3 Inhaler 1  . cholecalciferol (VITAMIN D) 1000 units tablet Take 1,000 Units daily by mouth.    . Cyanocobalamin (VITAMIN B-12 PO) Take by mouth daily.    . Ferrous Fumarate-Folic Acid (HEMOCYTE-F) 324-1 MG TABS Take 1 tablet by mouth daily. 90 each 1  . fluticasone (FLONASE) 50 MCG/ACT nasal spray Place 1 spray into both nostrils daily. 16 g 3  . furosemide (LASIX) 40 MG tablet 1 tab po daily and a 2nd tab po daily prn increased edema, wt gain>3 #/24 hour 100 tablet 5  . glucose blood (ONE TOUCH TEST STRIPS) test strip Use as instructed to check blood sugar once weekly. DX 790.29     . loratadine (CLARITIN) 10 MG tablet Take 1 tablet (10 mg total) by mouth at bedtime. 30 tablet 5  . metolazone (ZAROXOLYN) 2.5 MG tablet 1 tab po daily 1/2 hour prior to Lasix prn pedal edema, increasing SOB, weight gain >3# 20 tablet 1  . metoprolol succinate (TOPROL-XL) 50 MG 24 hr tablet TAKE 1 AND 1/2 TABLETS EVERY DAY 135 tablet 3  . montelukast (SINGULAIR) 10 MG tablet Take 1 tablet (10 mg total) by mouth at bedtime as needed. 30 tablet 3  . OXYGEN Inhale into the lungs.    . potassium chloride SA (K-DUR,KLOR-CON) 20 MEQ tablet Take 1 tablet (20 mEq total) by mouth daily. And a 2nd tab po daily prn extra Furosemide tab 100 tablet 5  . pravastatin (PRAVACHOL) 10 MG tablet Take 1 tablet (10 mg  total) by mouth daily. 90 tablet 1  . Probiotic Product (PROBIOTIC PO) Take by mouth daily.    . ranitidine (ZANTAC) 150 MG tablet TAKE 1 TABLET TWICE DAILY 180 tablet 1  . sertraline (ZOLOFT) 100 MG tablet TAKE 1 TABLET (100 MG TOTAL) BY MOUTH DAILY. 90 tablet 3  . SPIRIVA HANDIHALER 18 MCG inhalation capsule PLACE 1 CAPSULE (18 MCG TOTAL) INTO INHALER AND INHALE CONTENTS ONE TIME DAILY. 90 capsule 1  . traMADol (ULTRAM) 50 MG tablet TAKE 1 TABLET BY MOUTH THREE TIMES DAILY AS NEEDED 90 tablet 0   No current facility-administered medications on file prior to visit.      PAST  SURGICAL HISTORY Past Surgical History:  Procedure Laterality Date  . APPENDECTOMY  1951  . TONSILLECTOMY  1950  . TUBAL LIGATION  1968    FAMILY HISTORY: Family History  Problem Relation Age of Onset  . Asthma Sister   . Arthritis Sister   . Hypertension Sister   . Hyperlipidemia Sister   . Uterine cancer Sister   . Coronary artery disease Brother        x2  . Arthritis Brother   . Lung cancer Brother        smoker  . Hypertension Sister   . Arthritis Sister   . Hyperlipidemia Sister   . Emphysema Sister   . COPD Sister        smoker  . Heart disease Sister   . Hyperlipidemia Sister   . Hypertension Sister   . Arthritis Sister   . Emphysema Brother   . Heart disease Brother         smoker  . Heart attack Brother   . Mental illness Father   . Alcohol abuse Father   . Suicidality Father   . Heart disease Mother   . Hyperlipidemia Mother   . Heart attack Mother   . Epilepsy Daughter   . Hypertension Daughter   . Obesity Daughter   . COPD Brother        smoker  . Lung cancer Brother   . Coronary artery disease Other   . Colon polyps Sister     SOCIAL HISTORY:  reports that she has never smoked. She has never used smokeless tobacco. She reports that she does not drink alcohol or use drugs.  PERFORMANCE STATUS: The patient's performance status is 2 - Symptomatic, <50% confined to  bed  PHYSICAL EXAM: Most Recent Vital Signs: Blood pressure (!) 107/32, pulse 65, temperature 98.3 F (36.8 C), temperature source Oral, resp. rate 19, weight 196 lb (88.9 kg), SpO2 100 %. BP (!) 107/32 (BP Location: Right Arm, Patient Position: Sitting)   Pulse 65   Temp 98.3 F (36.8 C) (Oral)   Resp 19   Wt 196 lb (88.9 kg)   SpO2 100%   BMI 32.67 kg/m   General Appearance:    Alert, cooperative, no distress, appears stated age  Head:    Normocephalic, without obvious abnormality, atraumatic  Eyes:    PERRL, conjunctiva/corneas clear, EOM's intact, fundi    benign, both eyes  Ears:    Normal TM's and external ear canals, both ears  Nose:   Nares normal, septum midline, mucosa normal, no drainage    or sinus tenderness        Back:     Symmetric, no curvature, ROM normal, no CVA tenderness  Lungs:     Clear to auscultation bilaterally, respirations unlabored  Chest Wall:    No tenderness or deformity   Heart:    Regular rate and rhythm, S1 and S2 normal, no murmur, rub   or gallop     Abdomen:     Soft, non-tender, bowel sounds active all four quadrants,    no masses, no organomegaly        Extremities:   Extremities normal, atraumatic, no cyanosis or edema  Pulses:   2+ and symmetric all extremities  Skin:   Skin color, texture, turgor normal, no rashes or lesions  Lymph nodes:   Cervical, supraclavicular, and axillary nodes normal  Neurologic:   CNII-XII intact, normal strength, sensation and reflexes    throughout  LABORATORY DATA:  Results for orders placed or performed in visit on 07/16/17 (from the past 48 hour(s))  CBC with Differential ([place] Only)     Status: Abnormal   Collection Time: 07/16/17  3:08 PM  Result Value Ref Range   WBC Count 5.5 3.9 - 10.0 K/uL   RBC 3.26 (L) 3.70 - 5.32 MIL/uL   Hemoglobin 10.6 (L) 11.6 - 15.9 g/dL   HCT 33.8 (L) 34.8 - 46.6 %   MCV 103.7 (H) 81.0 - 101.0 fL   MCH 32.5 26.0 - 34.0 pg   MCHC 31.4 (L) 32.0 - 36.0  g/dL   RDW 13.1 11.1 - 15.7 %   Platelet Count 109 (L) 145 - 400 K/uL   Neutrophils Relative % 63 %   Neutro Abs 3.5 1.5 - 6.5 K/uL   Lymphocytes Relative 17 %   Lymphs Abs 0.9 0.9 - 3.3 K/uL   Monocytes Relative 13 %   Monocytes Absolute 0.7 0.1 - 0.9 K/uL   Eosinophils Relative 6 %   Eosinophils Absolute 0.3 0.0 - 0.5 K/uL   Basophils Relative 1 %   Basophils Absolute 0.0 0.0 - 0.1 K/uL    Comment: Performed at [facility] at [facility], [address], [place], [place] [address]  Save smear     Status: None   Collection Time: 07/16/17  3:08 PM  Result Value Ref Range   Smear Review SMEAR STAINED AND AVAILABLE FOR REVIEW     Comment: Performed at [facility] at [facility], [address] [place], [place] [address]  CMP ([place] only)     Status: Abnormal   Collection Time: 07/16/17  3:08 PM  Result Value Ref Range   Sodium 145 128 - 145 mmol/L   Potassium 4.3 3.3 - 4.7 mmol/L   Chloride 101 98 - 108 mmol/L   CO2 31 18 - 33 mmol/L   Glucose, Bld 99 73 - 118 mg/dL   BUN 49 (H) 7 - 22 mg/dL   Creatinine 2.00 (H) 0.60 - 1.20 mg/dL   Calcium 9.4 8.0 - 10.3 mg/dL   Total Protein 7.0 6.4 - 8.1 g/dL   Albumin 3.9 3.5 - 5.0 g/dL   AST 20 11 - 38 U/L   ALT 20 10 - 47 U/L   Alkaline Phosphatase 49 26 - 84 U/L   Total Bilirubin 0.7 0.2 - 1.6 mg/dL   Anion gap 13 5 - 15    Comment: Performed at [facility] at [facility], [address], [place], [place] [address]      RADIOGRAPHY: No results found.     PATHOLOGY: None  ASSESSMENT/PLAN: [name] is a very pleasant 77 yo caucasian female with multifactorial anemia (iron deficiency and anemia of chronic renal insufficiency). She is on an oral iron supplement but is still symptomatic as mentioned above.  We will see what her lab work shows and determine if she would benefit from Aranesp and/or IV iron.  We will determine when to bring her  back in for follow-up once these labs are available.   All questions were answered and she is in agreement with the plan. She will contact our office with any questions or concerns. We can certainly see her sooner if need be.   She was discussed with and also seen by Dr. Marin Olp and he is in agreement with the aforementioned.   [facility]  Addendum:  I agree with the above assessment by [name].  I saw and examined the patient with [name].  Her iron studies do not look that bad.  Her ferritin is 285 with an iron saturation of 27%.  Her erythropoietin level is only 5.4.  She clearly will benefit from Aranesp.  I think this should be tried.  I think will make her feel better to have her hemoglobin at a higher level.  Of note, her corrected reticulocyte count is about 1%.  This is quite low.  We spent about 40 minutes with [name].  All the time was spent face-to-face with her.  We counseled her for about 30 minutes.  We answered all of her questions.  I truly believe that we can help her and improve her hemoglobin says she will feel better.  Lattie Haw, MD

## 2017-07-17 ENCOUNTER — Telehealth: Payer: Self-pay | Admitting: Family

## 2017-07-17 ENCOUNTER — Other Ambulatory Visit: Payer: Self-pay | Admitting: Family

## 2017-07-17 DIAGNOSIS — D631 Anemia in chronic kidney disease: Secondary | ICD-10-CM | POA: Insufficient documentation

## 2017-07-17 LAB — RETICULOCYTES
RBC.: 3.24 MIL/uL — AB (ref 3.70–5.45)
Retic Count, Absolute: 42.1 10*3/uL (ref 33.7–90.7)
Retic Ct Pct: 1.3 % (ref 0.7–2.1)

## 2017-07-17 LAB — LACTATE DEHYDROGENASE: LDH: 132 U/L (ref 98–192)

## 2017-07-17 LAB — ERYTHROPOIETIN: Erythropoietin: 5.4 m[IU]/mL (ref 2.6–18.5)

## 2017-07-17 NOTE — Telephone Encounter (Signed)
I spoke with [name] and went over her lab results with her. Vit B 12 and iron studies are stable but her erythropoietin level is quite low. I spoke with Dr. Marin Olp and we will start her on Aranesp 300 mcg monthly to for Hgb < 11. All questions were answered and she verbalized understanding as to why she needed this medication. We will plan to see her back in another 6 weeks for follow-up.

## 2017-07-17 NOTE — Telephone Encounter (Signed)
Requesting:tramadol Contract:needs one  UDS:low risk next screen 12/23/17 Last OV: 06/16/17  Next OV:09/17/17 Last Refill:05/29/17 #[id number] Database:   Please advise

## 2017-07-21 ENCOUNTER — Other Ambulatory Visit: Payer: 59 | Admitting: *Deleted

## 2017-07-21 DIAGNOSIS — I5032 Chronic diastolic (congestive) heart failure: Secondary | ICD-10-CM | POA: Diagnosis not present

## 2017-07-21 DIAGNOSIS — I10 Essential (primary) hypertension: Secondary | ICD-10-CM

## 2017-07-21 DIAGNOSIS — N183 Chronic kidney disease, stage 3 unspecified: Secondary | ICD-10-CM

## 2017-07-22 LAB — BASIC METABOLIC PANEL
BUN / CREAT RATIO: 23 (ref 12–28)
BUN: 41 mg/dL — AB (ref 8–27)
CO2: 26 mmol/L (ref 20–29)
CREATININE: 1.76 mg/dL — AB (ref 0.57–1.00)
Calcium: 9.7 mg/dL (ref 8.7–10.3)
Chloride: 102 mmol/L (ref 96–106)
GFR calc Af Amer: 32 mL/min/{1.73_m2} — ABNORMAL LOW (ref >59)
GFR calc non Af Amer: 28 mL/min/{1.73_m2} — ABNORMAL LOW (ref >59)
GLUCOSE: 103 mg/dL — AB (ref 65–99)
Potassium: 4.7 mmol/L (ref 3.5–5.2)
Sodium: 143 mmol/L (ref 134–144)

## 2017-07-24 ENCOUNTER — Inpatient Hospital Stay: Payer: Medicare HMO | Attending: Family

## 2017-07-24 VITALS — BP 147/53 | HR 78 | Temp 97.6°F | Resp 20

## 2017-07-24 DIAGNOSIS — N183 Chronic kidney disease, stage 3 (moderate): Secondary | ICD-10-CM | POA: Diagnosis not present

## 2017-07-24 DIAGNOSIS — D631 Anemia in chronic kidney disease: Secondary | ICD-10-CM

## 2017-07-24 DIAGNOSIS — I129 Hypertensive chronic kidney disease with stage 1 through stage 4 chronic kidney disease, or unspecified chronic kidney disease: Secondary | ICD-10-CM | POA: Diagnosis not present

## 2017-07-24 DIAGNOSIS — Z79899 Other long term (current) drug therapy: Secondary | ICD-10-CM | POA: Diagnosis not present

## 2017-07-24 MED ORDER — DARBEPOETIN ALFA 300 MCG/0.6ML IJ SOSY
PREFILLED_SYRINGE | INTRAMUSCULAR | Status: AC
  Filled 2017-07-24: qty 0.6

## 2017-07-24 MED ORDER — DARBEPOETIN ALFA 300 MCG/0.6ML IJ SOSY
300.0000 ug | PREFILLED_SYRINGE | Freq: Once | INTRAMUSCULAR | Status: AC
  Administered 2017-07-24: 300 ug via SUBCUTANEOUS

## 2017-07-24 NOTE — Patient Instructions (Signed)

## 2017-07-31 ENCOUNTER — Other Ambulatory Visit: Payer: Self-pay | Admitting: Family

## 2017-07-31 ENCOUNTER — Encounter: Payer: Self-pay | Admitting: Family

## 2017-08-11 ENCOUNTER — Other Ambulatory Visit: Payer: Self-pay | Admitting: Family

## 2017-08-18 ENCOUNTER — Telehealth: Payer: Self-pay | Admitting: Cardiology

## 2017-08-18 NOTE — Telephone Encounter (Signed)
Called patient back. Informed patient that there were no future lab work ordered for her. Informed patient that her oncologist had ordered future lab work and she should check with them. Patient verbalized understanding and will make a call to her oncologist.

## 2017-08-18 NOTE — Telephone Encounter (Signed)
New Message:       Pt is calling and states she was supposed to have labs done in the beginning of July at [facility] and she forgot. Pt would like to know if she can still go to get these done.

## 2017-08-26 ENCOUNTER — Encounter: Payer: Self-pay | Admitting: Cardiology

## 2017-08-26 ENCOUNTER — Ambulatory Visit: Payer: 59 | Admitting: Cardiology

## 2017-08-26 VITALS — BP 108/60 | HR 77 | Ht 64.95 in | Wt 191.0 lb

## 2017-08-26 DIAGNOSIS — I5032 Chronic diastolic (congestive) heart failure: Secondary | ICD-10-CM | POA: Diagnosis not present

## 2017-08-26 NOTE — Patient Instructions (Signed)
Medication Instructions:   TAKE AN EXTRA LASIX IF YOU GAIN 3 LBS IN ONE DAY 5 LBS IN A WEEK.   IF [place]      If you need a refill on your cardiac medications before your next appointment, please call your pharmacy.  Labwork: NONE ORDERED  TODAY    Testing/Procedures: NONE ORDERED  TODAY    Follow-Up: AS SCHEDULED WITH DR TURNER   Any Other Special Instructions Will Be Listed Below (If Applicable).

## 2017-08-26 NOTE — Progress Notes (Signed)
08/26/2017 [name]   1940/12/25  [id number]  Primary Physician Mosie Lukes, MD Primary Cardiologist: Dr. Radford Pax   Reason for Visit/CC: f/u for chronic diastolic HF  HPI:  [name] is a 77 y.o. female with a hx of asthma, atypical CP with normal coronary arteries by cath in 2008 in the setting of NSTEMI complicated by right groin pseudoaneurysm, chronic diastolic CHF, GERD, HTN and hyperlipidemia. 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  Last echo showed normal LVF with mild MR and grade I diastolic dysfunction.   She is followed by Dr. Radford Pax and presents to clinic today for f/u for chronic diastolic HF. She was seen recently by Dr. Radford Pax 07/14/17 and noted weight gain and bilateral LEE. 6 lb weight gain in just a few days. Also noted increase dyspnea. She was noted to be volume overloaded on exam. Office weight was 197 lb. Dr. Radford Pax elected to increase her Lasix to 40 mg twice daily for 3 days and then go back to Lasix 40 mg daily with additional 40 mg as needed for weight greater than 3 pounds in 24 hours or increased edema. Pt had a repeat BMP on 07/21/17 which showed stable SCr at 1.76 (baseline) and normal K. She presents back to clinic today for 6 week f/u.   Her weight today is much improved and back to her baseline at 191 lb (197 lb 07/14/17). She continues to have chronic LEE, but overall improved and back to baseline. BP is controlled and stable. She has been checking her weight daily at home.    Current Meds  Medication Sig  . acetaminophen (TYLENOL) 500 MG tablet Take 500 mg by mouth every 6 (six) hours as needed.  Marland Kitchen albuterol (ACCUNEB) 0.63 MG/3ML nebulizer solution Take 3 mLs (0.63 mg total) by nebulization every 6 (six) hours as needed for wheezing or shortness of breath.  Marland Kitchen albuterol (PROVENTIL HFA;VENTOLIN HFA) 108 (90 Base) MCG/ACT inhaler INHALE 2 PUFFS INTO THE LUNGS 4 (FOUR) TIMES DAILY AS NEEDED FOR WHEEZING OR SHORTNESS  OF BREATH.  Marland Kitchen allopurinol (ZYLOPRIM) 100 MG tablet TAKE 2 TABLETS ONE TIME DAILY  . aspirin EC 81 MG tablet Take 1 tablet (81 mg total) by mouth daily.  Marland Kitchen b complex vitamins tablet Take 1 tablet by mouth daily.  . budesonide-formoterol (SYMBICORT) 160-4.5 MCG/ACT inhaler INHALE 2 PUFFS INTO THE LUNGS 2 TIMES DAILY  . cholecalciferol (VITAMIN D) 1000 units tablet Take 1,000 Units daily by mouth.  . Cyanocobalamin (VITAMIN B-12 PO) Take by mouth daily.  . Ferrous Fumarate-Folic Acid (HEMOCYTE-F) 324-1 MG TABS Take 1 tablet by mouth daily.  . fluticasone (FLONASE) 50 MCG/ACT nasal spray Place 1 spray into both nostrils daily.  . furosemide (LASIX) 40 MG tablet 1 tab po daily and a 2nd tab po daily prn increased edema, wt gain>3 #/24 hour  . glucose blood (ONE TOUCH TEST STRIPS) test strip Use as instructed to check blood sugar once weekly. DX 790.29   . loratadine (CLARITIN) 10 MG tablet Take 1 tablet (10 mg total) by mouth at bedtime.  . metoprolol succinate (TOPROL-XL) 50 MG 24 hr tablet TAKE 1 AND 1/2 TABLETS EVERY DAY  . montelukast (SINGULAIR) 10 MG tablet Take 1 tablet (10 mg total) by mouth at bedtime as needed.  . OXYGEN Inhale into the lungs.  . potassium chloride SA (K-DUR,KLOR-CON) 20 MEQ tablet Take 1 tablet (20 mEq total) by mouth  daily. And a 2nd tab po daily prn extra Furosemide tab  . pravastatin (PRAVACHOL) 10 MG tablet Take 1 tablet (10 mg total) by mouth daily.  . Probiotic Product (PROBIOTIC PO) Take by mouth daily.  . ranitidine (ZANTAC) 150 MG tablet TAKE 1 TABLET TWICE DAILY  . sertraline (ZOLOFT) 100 MG tablet TAKE 1 TABLET (100 MG TOTAL) BY MOUTH DAILY.  Marland Kitchen SPIRIVA HANDIHALER 18 MCG inhalation capsule PLACE 1 CAPSULE (18 MCG TOTAL) INTO INHALER AND INHALE CONTENTS ONE TIME DAILY.  . traMADol (ULTRAM) 50 MG tablet TAKE 1 TABLET BY MOUTH THREE TIMES DAILY AS NEEDED   Allergies  Allergen Reactions  . Oseltamivir Phosphate     TAMIFLU REACTION: nausea, vomiting, diarrhea,  dizziness  . Sulfa Antibiotics Other (See Comments)    CP  . Ace Inhibitors   . Indomethacin   . Montelukast Sodium     REACTION: Heart palpitations, chest pain  . Sulfonamide Derivatives    Past Medical History:  Diagnosis Date  . Abnormal glucose tolerance test   . Abnormal TSH 09/22/2016  . ACE-inhibitor cough   . Anemia 03/12/2014  . Arthritis   . Asthma    PFT 02/06/09 FEV1 1.41 (65%), FVC 1.92 (64), FEV1% 74, TLC 3.47 (71%), DLCO 48%, +BD  . Atypical chest pain    s/p cath, Normal coronaries, Non ST elevation myocardial infarction, Rt groin pseudoaneurysm  . Bacterial vaginosis 03/12/2014  . Cellulitis 06/22/2016  . Chronic kidney disease (CKD), stage III (moderate) ([place]) 08/04/2016  . COPD (chronic obstructive pulmonary disease) ([place])   . Depression   . Diastolic heart failure ([place]) 04/07/2016  . DVT (deep venous thrombosis) ([place]) 1987  . Fall at home 12/04/2014  . GERD (gastroesophageal reflux disease)   . Gout   . Hypercalcemia 10/15/2014  . Hyperlipidemia   . Hypertension   . Hypoxia 10/15/2014  . Macular degeneration 04/10/2015  . Osteopenia 12/29/2006   Qualifier: Diagnosis of  By: Wynona Luna   . Pneumonia   . 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 Renal insufficiency 09/22/2016  . Sun-damaged skin 10/24/2012  . Unspecified constipation 06/05/2013  . Urinary frequency 10/24/2012  . UTI (lower urinary tract infection) 10/24/2012  . Vitamin D deficiency 01/01/2015   Family History  Problem Relation Age of Onset  . Asthma Sister   . Arthritis Sister   . Hypertension Sister   . Hyperlipidemia Sister   . Uterine cancer Sister   . Coronary artery disease Brother        x2  . Arthritis Brother   . Lung cancer Brother        smoker  . Hypertension Sister   . Arthritis Sister   . Hyperlipidemia Sister   . Emphysema Sister   . COPD Sister        smoker  . Heart disease Sister   . Hyperlipidemia Sister   . Hypertension Sister   . Arthritis Sister   .  Emphysema Brother   . Heart disease Brother         smoker  . Heart attack Brother   . Mental illness Father   . Alcohol abuse Father   . Suicidality Father   . Heart disease Mother   . Hyperlipidemia Mother   . Heart attack Mother   . Epilepsy Daughter   . Hypertension Daughter   . Obesity Daughter   . COPD Brother        smoker  . Lung cancer Brother   .  Coronary artery disease Other   . Colon polyps Sister    Past Surgical History:  Procedure Laterality Date  . APPENDECTOMY  1951  . TONSILLECTOMY  1950  . TUBAL LIGATION  1968   Social History   Socioeconomic History  . Marital status: Widowed    Spouse name: Not on file  . Number of children: 1  . Years of education: Not on file  . Highest education level: Not on file  Occupational History  . 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 Financial resource strain: Not on file  . Food insecurity:    Worry: Not on file    Inability: Not on file  . Transportation needs:    Medical: Not on file    Non-medical: Not on file  Tobacco Use  . Smoking status: Never Smoker  . Smokeless tobacco: Never Used  Substance and Sexual Activity  . Alcohol use: No  . Drug use: No  . Sexual activity: Never    Comment: lives alone, no dietary restrictions  Lifestyle  . Physical activity:    Days per week: Not on file    Minutes per session: Not on file  . Stress: Not on file  Relationships  . Social connections:    Talks on phone: Not on file    Gets together: Not on file    Attends religious service: Not on file    Active member of club or organization: Not on file    Attends meetings of clubs or organizations: Not on file    Relationship status: Not on file  . Intimate partner violence:    Fear of current or ex partner: Not on file    Emotionally abused: Not on file    Physically abused: Not on file    Forced sexual activity: Not on file  Other Topics Concern  . Not on file  Social History Narrative    . Not on file     Review of Systems: General: negative for chills, fever, night sweats or weight changes.  Cardiovascular: negative for chest pain, dyspnea on exertion, edema, orthopnea, palpitations, paroxysmal nocturnal dyspnea or shortness of breath Dermatological: negative for rash Respiratory: negative for cough or wheezing Urologic: negative for hematuria Abdominal: negative for nausea, vomiting, diarrhea, bright red blood per rectum, melena, or hematemesis Neurologic: negative for visual changes, syncope, or dizziness All other systems reviewed and are otherwise negative except as noted above.   Physical Exam:  Height 5' 4.95" (1.65 m), weight 191 lb (86.6 kg).  General appearance: alert, cooperative and no distress Neck: no carotid bruit and no JVD Lungs: clear to auscultation bilaterally Heart: regular rate and rhythm, S1, S2 normal, no murmur, click, rub or gallop Extremities: chronic bilateral LEE, non pitting, venous stasis dermatitis bilaterally, varicose veins noted.  Pulses: 2+ and symmetric Skin: Skin color, texture, turgor normal. No rashes or lesions Neurologic: Grossly normal  EKG not performed -- personally reviewed   ASSESSMENT AND PLAN:   1. Chronic Diastolic HF: weight much improved after diuretic dose adjustment. Weight down from 197 lb at last OV to 191 lb today, which is her baseline. She had f/u labs 78/2/19 and BMP showed stable renal function and K. Lungs are CTAB and legs are back to baseline, per pt report. We will continue on 40 mg of lasix daily and she is to take an extra 40 mg of Lasix and extra potassium if > 3 lb weight gain in 24 hrs.  2. HTN: controlled on current regimen. No changes made.     Follow-Up: keep f/u with Dr. Radford Pax in Dec.   Abdirahim Flavell Ladoris Gene, M[facility] 08/26/2017 1:56 PM

## 2017-08-28 ENCOUNTER — Other Ambulatory Visit: Payer: Self-pay | Admitting: Family Medicine

## 2017-08-28 NOTE — Telephone Encounter (Signed)
Requesting: tramadol Contract: 12/23/17 UDS: 12/23/17 Last OV: 06/16/17 Next OV: 09/17/17 Last Refill: 07/17/17 Database: no discrepancies    Please advise

## 2017-09-04 ENCOUNTER — Other Ambulatory Visit: Payer: Self-pay

## 2017-09-04 ENCOUNTER — Inpatient Hospital Stay: Payer: Medicare HMO | Attending: Family | Admitting: Family

## 2017-09-04 ENCOUNTER — Encounter: Payer: Self-pay | Admitting: Family

## 2017-09-04 ENCOUNTER — Inpatient Hospital Stay: Payer: Medicare HMO

## 2017-09-04 VITALS — BP 135/50 | HR 71 | Temp 98.1°F | Resp 18 | Wt 192.0 lb

## 2017-09-04 DIAGNOSIS — Z9981 Dependence on supplemental oxygen: Secondary | ICD-10-CM | POA: Diagnosis not present

## 2017-09-04 DIAGNOSIS — N183 Chronic kidney disease, stage 3 (moderate): Secondary | ICD-10-CM

## 2017-09-04 DIAGNOSIS — Z79899 Other long term (current) drug therapy: Secondary | ICD-10-CM | POA: Diagnosis not present

## 2017-09-04 DIAGNOSIS — Z7982 Long term (current) use of aspirin: Secondary | ICD-10-CM | POA: Diagnosis not present

## 2017-09-04 DIAGNOSIS — D509 Iron deficiency anemia, unspecified: Secondary | ICD-10-CM | POA: Diagnosis not present

## 2017-09-04 DIAGNOSIS — I129 Hypertensive chronic kidney disease with stage 1 through stage 4 chronic kidney disease, or unspecified chronic kidney disease: Secondary | ICD-10-CM | POA: Diagnosis not present

## 2017-09-04 DIAGNOSIS — D508 Other iron deficiency anemias: Secondary | ICD-10-CM

## 2017-09-04 DIAGNOSIS — D631 Anemia in chronic kidney disease: Secondary | ICD-10-CM

## 2017-09-04 LAB — CBC WITH DIFFERENTIAL (CANCER CENTER ONLY)
BASOS ABS: 0 10*3/uL (ref 0.0–0.1)
Basophils Relative: 0 %
EOS PCT: 10 %
Eosinophils Absolute: 0.6 10*3/uL — ABNORMAL HIGH (ref 0.0–0.5)
HCT: 38.8 % (ref 34.8–46.6)
Hemoglobin: 12.1 g/dL (ref 11.6–15.9)
LYMPHS ABS: 1.3 10*3/uL (ref 0.9–3.3)
LYMPHS PCT: 20 %
MCH: 31.8 pg (ref 26.0–34.0)
MCHC: 31.2 g/dL — ABNORMAL LOW (ref 32.0–36.0)
MCV: 101.8 fL — AB (ref 81.0–101.0)
Monocytes Absolute: 0.8 10*3/uL (ref 0.1–0.9)
Monocytes Relative: 13 %
NEUTROS PCT: 57 %
Neutro Abs: 3.7 10*3/uL (ref 1.5–6.5)
PLATELETS: 108 10*3/uL — AB (ref 145–400)
RBC: 3.81 MIL/uL (ref 3.70–5.32)
RDW: 12.9 % (ref 11.1–15.7)
WBC Count: 6.4 10*3/uL (ref 3.9–10.0)

## 2017-09-04 LAB — RETICULOCYTES
RBC.: 3.75 MIL/uL (ref 3.70–5.45)
RETIC COUNT ABSOLUTE: 37.5 10*3/uL (ref 33.7–90.7)
Retic Ct Pct: 1 % (ref 0.7–2.1)

## 2017-09-04 NOTE — Progress Notes (Signed)
Hematology and Oncology Follow Up Visit  [name] [id number] [birth year] 77 y.o. 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 She is doing well and has no complaints at this time. She received Aranesp in June and her Hgb is now up to 12.1.  Her SOB is stable. She is doing well on 2L [place] supplemental O2 24 hours a day.  No fever, chills, n/v, cough, rash, dizziness, chest pain, palpitations, abdominal pain or changes in bowel or bladder habits.  She has chronic swelling in her lower extremities which she states is stable. She takes her Lasix daily as prescribed which helps keep the edema down.  No tenderness, numbness or tingling noted on her exam today.  No lymphadenopathy. No episodes of bleeding, no bruising or petechiae.   She has maintained a good appetite and is hydrating well.   ECOG Performance Status: 1 - Symptomatic but completely ambulatory  Medications:  Allergies as of 09/04/2017      Reactions   Oseltamivir Phosphate    TAMIFLU REACTION: nausea, vomiting, diarrhea, dizziness   Sulfa Antibiotics Other (See Comments)   CP   Ace Inhibitors    Indomethacin    Montelukast Sodium    REACTION: Heart palpitations, chest pain   Sulfonamide Derivatives       Medication List        Accurate as of 09/04/17 11:14 AM. Always use your most recent med list.          acetaminophen 500 MG tablet Commonly known as:  TYLENOL Take 500 mg by mouth every 6 (six) hours as needed.   albuterol 0.63 MG/3ML nebulizer solution Commonly known as:  ACCUNEB Take 3 mLs (0.63 mg total) by nebulization every 6 (six) hours as needed for wheezing or shortness of breath.   albuterol 108 (90 Base) MCG/ACT inhaler Commonly known as:  PROVENTIL HFA;VENTOLIN HFA INHALE 2 PUFFS INTO THE LUNGS 4 (FOUR) TIMES DAILY AS NEEDED  FOR WHEEZING OR SHORTNESS OF BREATH.   allopurinol 100 MG tablet Commonly known as:  ZYLOPRIM TAKE 2 TABLETS ONE TIME DAILY   aspirin EC 81 MG tablet Take 1 tablet (81 mg total) by mouth daily.   b complex vitamins tablet Take 1 tablet by mouth daily.   budesonide-formoterol 160-4.5 MCG/ACT inhaler Commonly known as:  SYMBICORT INHALE 2 PUFFS INTO THE LUNGS 2 TIMES DAILY   cholecalciferol 1000 units tablet Commonly known as:  VITAMIN D Take 1,000 Units daily by mouth.   Ferrous Fumarate-Folic Acid 378-5 MG Tabs Take 1 tablet by mouth daily.   fluticasone 50 MCG/ACT nasal spray Commonly known as:  FLONASE Place 1 spray into both nostrils daily.   furosemide 40 MG tablet Commonly known as:  LASIX 1 tab po daily and a 2nd tab po daily prn increased edema, wt gain>3 #/24 hour   loratadine 10 MG tablet Commonly known as:  CLARITIN Take 1 tablet (10 mg total) by mouth at bedtime.   metoprolol succinate 50 MG 24 hr tablet Commonly known as:  TOPROL-XL TAKE 1 AND 1/2 TABLETS EVERY DAY   montelukast 10 MG tablet Commonly known as:  SINGULAIR Take 1 tablet (10 mg total) by mouth at bedtime as needed.   ONE TOUCH TEST STRIPS test strip Generic drug:  glucose blood Use as instructed to check blood  sugar once weekly. DX 790.29   OXYGEN Inhale into the lungs.   potassium chloride SA 20 MEQ tablet Commonly known as:  K-DUR,KLOR-CON Take 1 tablet (20 mEq total) by mouth daily. And a 2nd tab po daily prn extra Furosemide tab   pravastatin 10 MG tablet Commonly known as:  PRAVACHOL TAKE 1 TABLET BY MOUTH ONCE DAILY   PROBIOTIC PO Take by mouth daily.   ranitidine 150 MG tablet Commonly known as:  ZANTAC TAKE 1 TABLET TWICE DAILY   sertraline 100 MG tablet Commonly known as:  ZOLOFT TAKE 1 TABLET (100 MG TOTAL) BY MOUTH DAILY.   SPIRIVA HANDIHALER 18 MCG inhalation capsule Generic drug:  tiotropium PLACE 1 CAPSULE (18 MCG TOTAL) INTO INHALER AND INHALE CONTENTS ONE  TIME DAILY.   traMADol 50 MG tablet Commonly known as:  ULTRAM TAKE 1 TABLET BY MOUTH THREE TIMES DAILY AS NEEDED   VITAMIN B-12 PO Take by mouth daily.       Allergies:  Allergies  Allergen Reactions  . Oseltamivir Phosphate     TAMIFLU REACTION: nausea, vomiting, diarrhea, dizziness  . Sulfa Antibiotics Other (See Comments)    CP  . Ace Inhibitors   . Indomethacin   . Montelukast Sodium     REACTION: Heart palpitations, chest pain  . Sulfonamide Derivatives     Past Medical History, Surgical history, Social history, and Family History were reviewed and updated.  Review of Systems: All other 10 point review of systems is negative.   Physical Exam:  vitals were not taken for this visit.   Wt Readings from Last 3 Encounters:  08/26/17 191 lb (86.6 kg)  07/16/17 196 lb (88.9 kg)  07/14/17 197 lb 3.2 oz (89.4 kg)    Ocular: Sclerae unicteric, pupils equal, round and reactive to light Ear-nose-throat: Oropharynx clear, dentition fair Lymphatic: No cervical, supraclavicular or axillary  adenopathy Lungs no rales or rhonchi, good excursion bilaterally Heart regular rate and rhythm, no murmur appreciated Abd soft, nontender, positive bowel sounds, no liver or spleen tip palpated on exam, no fluid wave  MSK no focal spinal tenderness, no joint edema Neuro: non-focal, well-oriented, appropriate affect Breasts: Deferred   Lab Results  Component Value Date   WBC 6.4 09/04/2017   HGB 12.1 09/04/2017   HCT 38.8 09/04/2017   MCV 101.8 (H) 09/04/2017   PLT 108 (L) 09/04/2017   Lab Results  Component Value Date   FERRITIN 285 07/16/2017   IRON 86 07/16/2017   TIBC 312 07/16/2017   UIBC 228 07/16/2017   IRONPCTSAT 27 07/16/2017   Lab Results  Component Value Date   RETICCTPCT 1.3 07/16/2017   RBC 3.81 09/04/2017   No results found for: KPAFRELGTCHN, LAMBDASER, KAPLAMBRATIO No results found for: IGGSERUM, IGA, IGMSERUM No results found for: Ronnald Ramp, A1GS, A2GS, Violet Baldy, MSPIKE, SPEI   Chemistry      Component Value Date/Time   NA 143 07/21/2017 1501   K 4.7 07/21/2017 1501   CL 102 07/21/2017 1501   CO2 26 07/21/2017 1501   BUN 41 (H) 07/21/2017 1501   CREATININE 1.76 (H) 07/21/2017 1501   CREATININE 2.00 (H) 07/16/2017 1508   CREATININE 1.36 (H) 08/23/2013 1134      Component Value Date/Time   CALCIUM 9.7 07/21/2017 1501   ALKPHOS 49 07/16/2017 1508   AST 20 07/16/2017 1508   ALT 20 07/16/2017 1508   BILITOT 0.7 07/16/2017 1508      Impression and Plan: Ms. Hickok is  a very pleasant 77 yo caucasian female with multifactorial anemia. She responded nicely to Aranesp and Hgb is now 12.1.  No injection needed this visit.  We will see what her iron studies show and bring her back in for infusion if needed.  We will go ahead and plan to see her back in another 6 weeks for follow-up.  She will contact our office with any questions or concerns. We can certainly see her sooner if need be.   Laverna Peace, NP 8/16/201911:14 AM

## 2017-09-07 LAB — IRON AND TIBC
IRON: 97 ug/dL (ref 41–142)
Saturation Ratios: 37 % (ref 21–57)
TIBC: 259 ug/dL (ref 236–444)
UIBC: 162 ug/dL

## 2017-09-07 LAB — FERRITIN: FERRITIN: 331 ng/mL — AB (ref 11–307)

## 2017-09-16 DIAGNOSIS — N179 Acute kidney failure, unspecified: Secondary | ICD-10-CM | POA: Diagnosis not present

## 2017-09-16 DIAGNOSIS — Z6834 Body mass index (BMI) 34.0-34.9, adult: Secondary | ICD-10-CM | POA: Diagnosis not present

## 2017-09-16 DIAGNOSIS — I739 Peripheral vascular disease, unspecified: Secondary | ICD-10-CM | POA: Diagnosis not present

## 2017-09-16 DIAGNOSIS — I129 Hypertensive chronic kidney disease with stage 1 through stage 4 chronic kidney disease, or unspecified chronic kidney disease: Secondary | ICD-10-CM | POA: Diagnosis not present

## 2017-09-16 DIAGNOSIS — I5030 Unspecified diastolic (congestive) heart failure: Secondary | ICD-10-CM | POA: Diagnosis not present

## 2017-09-16 DIAGNOSIS — E1122 Type 2 diabetes mellitus with diabetic chronic kidney disease: Secondary | ICD-10-CM | POA: Diagnosis not present

## 2017-09-16 DIAGNOSIS — N184 Chronic kidney disease, stage 4 (severe): Secondary | ICD-10-CM | POA: Diagnosis not present

## 2017-09-16 DIAGNOSIS — D631 Anemia in chronic kidney disease: Secondary | ICD-10-CM | POA: Diagnosis not present

## 2017-09-17 ENCOUNTER — Ambulatory Visit (INDEPENDENT_AMBULATORY_CARE_PROVIDER_SITE_OTHER): Payer: Medicare HMO | Admitting: Family Medicine

## 2017-09-17 ENCOUNTER — Encounter: Payer: Self-pay | Admitting: Family Medicine

## 2017-09-17 VITALS — BP 108/58 | HR 64 | Temp 98.7°F | Resp 18 | Ht 65.0 in | Wt 192.4 lb

## 2017-09-17 DIAGNOSIS — K219 Gastro-esophageal reflux disease without esophagitis: Secondary | ICD-10-CM

## 2017-09-17 DIAGNOSIS — R109 Unspecified abdominal pain: Secondary | ICD-10-CM

## 2017-09-17 DIAGNOSIS — E559 Vitamin D deficiency, unspecified: Secondary | ICD-10-CM

## 2017-09-17 DIAGNOSIS — E118 Type 2 diabetes mellitus with unspecified complications: Secondary | ICD-10-CM

## 2017-09-17 DIAGNOSIS — I10 Essential (primary) hypertension: Secondary | ICD-10-CM

## 2017-09-17 LAB — URINALYSIS
Bilirubin Urine: NEGATIVE
HGB URINE DIPSTICK: NEGATIVE
Ketones, ur: NEGATIVE
LEUKOCYTES UA: NEGATIVE
NITRITE: NEGATIVE
SPECIFIC GRAVITY, URINE: 1.01 (ref 1.000–1.030)
Total Protein, Urine: NEGATIVE
URINE GLUCOSE: NEGATIVE
UROBILINOGEN UA: 0.2 (ref 0.0–1.0)
pH: 5.5 (ref 5.0–8.0)

## 2017-09-17 NOTE — Progress Notes (Signed)
Subjective:  I acted as a Education administrator for Dr. Charlett Blake. [place], [place]  Patient ID: [name], female    DOB: 02-05-1940, 77 y.o.   MRN: [id number]  No chief complaint on file.   HPI  Patient is in today for a 3 month follow up. She is following up on her HTN, DM and other medical concerns. She c/o some rectal blood bleeding a couple of times, bright red but only a small amount each time. Notes some hard stool may be associated. Notes some mild abdominal pain in lower abdomen as well. Has continued to follow with nephrology. No changes recently. Also notes an occasional headache but no vision or hearing changes or further neurologic complaints. Also notes some trouble with her right ankle but no trauma. Denies CP/palp/SOB/HA/congestion/fevers or GU c/o. Taking meds as prescribed. No polydipsia.   Patient Care Team: Mosie Lukes, MD as PCP - General (Family Medicine) Shirley Muscat Loreen Freud, MD as Referring Physician (Optometry) Parrett, Fonnie Mu, NP as Nurse Practitioner (Pulmonary Disease) Rigoberto Noel, MD as Consulting Physician (Pulmonary Disease) Donato Heinz, MD as Consulting Physician (Nephrology)   Past Medical History:  Diagnosis Date  . Abnormal glucose tolerance test   . Abnormal TSH 09/22/2016  . ACE-inhibitor cough   . Anemia 03/12/2014  . Arthritis   . Asthma    PFT 02/06/09 FEV1 1.41 (65%), FVC 1.92 (64), FEV1% 74, TLC 3.47 (71%), DLCO 48%, +BD  . Atypical chest pain    s/p cath, Normal coronaries, Non ST elevation myocardial infarction, Rt groin pseudoaneurysm  . Bacterial vaginosis 03/12/2014  . Cellulitis 06/22/2016  . Chronic kidney disease (CKD), stage III (moderate) ([place]) 08/04/2016  . COPD (chronic obstructive pulmonary disease) ([place])   . Depression   . Diastolic heart failure ([place]) 04/07/2016  . DVT (deep venous thrombosis) ([place]) 1987  . Fall at home 12/04/2014  . GERD (gastroesophageal reflux disease)   . Gout   . Hypercalcemia 10/15/2014  . Hyperlipidemia     . Hypertension   . Hypoxia 10/15/2014  . Macular degeneration 04/10/2015  . Osteopenia 12/29/2006   Qualifier: Diagnosis of  By: Wynona Luna   . Pneumonia   . 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 Renal insufficiency 09/22/2016  . Sun-damaged skin 10/24/2012  . Unspecified constipation 06/05/2013  . Urinary frequency 10/24/2012  . UTI (lower urinary tract infection) 10/24/2012  . Vitamin D deficiency 01/01/2015    Past Surgical History:  Procedure Laterality Date  . APPENDECTOMY  1951  . TONSILLECTOMY  1950  . TUBAL LIGATION  1968    Family History  Problem Relation Age of Onset  . Asthma Sister   . Arthritis Sister   . Hypertension Sister   . Hyperlipidemia Sister   . Uterine cancer Sister   . Coronary artery disease Brother        x2  . Arthritis Brother   . Lung cancer Brother        smoker  . Hypertension Sister   . Arthritis Sister   . Hyperlipidemia Sister   . Emphysema Sister   . COPD Sister        smoker  . Heart disease Sister   . Hyperlipidemia Sister   . Hypertension Sister   . Arthritis Sister   . Emphysema Brother   . Heart disease Brother         smoker  . Heart attack Brother   . Mental illness Father   . Alcohol abuse Father   .  Suicidality Father   . Heart disease Mother   . Hyperlipidemia Mother   . Heart attack Mother   . Epilepsy Daughter   . Hypertension Daughter   . Obesity Daughter   . COPD Brother        smoker  . Lung cancer Brother   . Coronary artery disease Other   . Colon polyps Sister     Social History   Socioeconomic History  . Marital status: Widowed    Spouse name: Not on file  . Number of children: 1  . Years of education: Not on file  . Highest education level: Not on file  Occupational History  . 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 Financial resource strain: Not on file  . Food insecurity:    Worry: Not on file    Inability: Not on file  . Transportation needs:    Medical:  Not on file    Non-medical: Not on file  Tobacco Use  . Smoking status: Never Smoker  . Smokeless tobacco: Never Used  Substance and Sexual Activity  . Alcohol use: No  . Drug use: No  . Sexual activity: Never    Comment: lives alone, no dietary restrictions  Lifestyle  . Physical activity:    Days per week: Not on file    Minutes per session: Not on file  . Stress: Not on file  Relationships  . Social connections:    Talks on phone: Not on file    Gets together: Not on file    Attends religious service: Not on file    Active member of club or organization: Not on file    Attends meetings of clubs or organizations: Not on file    Relationship status: Not on file  . Intimate partner violence:    Fear of current or ex partner: Not on file    Emotionally abused: Not on file    Physically abused: Not on file    Forced sexual activity: Not on file  Other Topics Concern  . Not on file  Social History Narrative  . Not on file    Outpatient Medications Prior to Visit  Medication Sig Dispense Refill  . acetaminophen (TYLENOL) 500 MG tablet Take 500 mg by mouth every 6 (six) hours as needed.    Marland Kitchen albuterol (ACCUNEB) 0.63 MG/3ML nebulizer solution Take 3 mLs (0.63 mg total) by nebulization every 6 (six) hours as needed for wheezing or shortness of breath. 150 mL 0  . albuterol (PROVENTIL HFA;VENTOLIN HFA) 108 (90 Base) MCG/ACT inhaler INHALE 2 PUFFS INTO THE LUNGS 4 (FOUR) TIMES DAILY AS NEEDED FOR WHEEZING OR SHORTNESS OF BREATH. 54 g 0  . allopurinol (ZYLOPRIM) 100 MG tablet TAKE 2 TABLETS ONE TIME DAILY 180 tablet 1  . aspirin EC 81 MG tablet Take 1 tablet (81 mg total) by mouth daily.    Marland Kitchen b complex vitamins tablet Take 1 tablet by mouth daily.    . budesonide-formoterol (SYMBICORT) 160-4.5 MCG/ACT inhaler INHALE 2 PUFFS INTO THE LUNGS 2 TIMES DAILY 3 Inhaler 1  . cholecalciferol (VITAMIN D) 1000 units tablet Take 1,000 Units daily by mouth.    . Cyanocobalamin (VITAMIN B-12 PO)  Take by mouth daily.    . Ferrous Fumarate-Folic Acid (HEMOCYTE-F) 324-1 MG TABS Take 1 tablet by mouth daily. 90 each 1  . fluticasone (FLONASE) 50 MCG/ACT nasal spray Place 1 spray into both nostrils daily. 16 g 3  . furosemide (LASIX)  40 MG tablet 1 tab po daily and a 2nd tab po daily prn increased edema, wt gain>3 #/24 hour 100 tablet 5  . glucose blood (ONE TOUCH TEST STRIPS) test strip Use as instructed to check blood sugar once weekly. DX 790.29     . loratadine (CLARITIN) 10 MG tablet Take 1 tablet (10 mg total) by mouth at bedtime. 30 tablet 5  . metoprolol succinate (TOPROL-XL) 50 MG 24 hr tablet TAKE 1 AND 1/2 TABLETS EVERY DAY 135 tablet 3  . montelukast (SINGULAIR) 10 MG tablet Take 1 tablet (10 mg total) by mouth at bedtime as needed. 30 tablet 3  . OXYGEN Inhale into the lungs.    . potassium chloride SA (K-DUR,KLOR-CON) 20 MEQ tablet Take 1 tablet (20 mEq total) by mouth daily. And a 2nd tab po daily prn extra Furosemide tab 100 tablet 5  . pravastatin (PRAVACHOL) 10 MG tablet TAKE 1 TABLET BY MOUTH ONCE DAILY 90 tablet 1  . Probiotic Product (PROBIOTIC PO) Take by mouth daily.    . ranitidine (ZANTAC) 150 MG tablet TAKE 1 TABLET TWICE DAILY 180 tablet 1  . sertraline (ZOLOFT) 100 MG tablet TAKE 1 TABLET (100 MG TOTAL) BY MOUTH DAILY. 90 tablet 3  . SPIRIVA HANDIHALER 18 MCG inhalation capsule PLACE 1 CAPSULE (18 MCG TOTAL) INTO INHALER AND INHALE CONTENTS ONE TIME DAILY. 90 capsule 1  . traMADol (ULTRAM) 50 MG tablet TAKE 1 TABLET BY MOUTH THREE TIMES DAILY AS NEEDED 90 tablet 0   No facility-administered medications prior to visit.     Allergies  Allergen Reactions  . Oseltamivir Phosphate     TAMIFLU REACTION: nausea, vomiting, diarrhea, dizziness  . Sulfa Antibiotics Other (See Comments)    CP  . Ace Inhibitors   . Indomethacin   . Montelukast Sodium     REACTION: Heart palpitations, chest pain  . Sulfonamide Derivatives     Review of Systems  Constitutional:  Negative for fever and malaise/fatigue.  HENT: Negative for congestion.   Eyes: Negative for blurred vision.  Respiratory: Negative for shortness of breath.   Cardiovascular: Negative for chest pain, palpitations and leg swelling.  Gastrointestinal: Positive for abdominal pain and blood in stool. Negative for constipation, diarrhea, melena, nausea and vomiting.  Genitourinary: Negative for dysuria and frequency.  Musculoskeletal: Positive for joint pain. Negative for falls.  Skin: Negative for rash.  Neurological: Positive for headaches. Negative for dizziness and loss of consciousness.  Endo/Heme/Allergies: Negative for environmental allergies.  Psychiatric/Behavioral: Negative for depression. The patient is not nervous/anxious.        Objective:    Physical Exam  Constitutional: She is oriented to person, place, and time. She appears well-developed and well-nourished. No distress.  HENT:  Head: Normocephalic and atraumatic.  Nose: Nose normal.  Eyes: Right eye exhibits no discharge. Left eye exhibits no discharge.  Neck: Normal range of motion. Neck supple.  Cardiovascular: Normal rate and regular rhythm.  No murmur heard. Pulmonary/Chest: Effort normal and breath sounds normal.  Abdominal: Soft. Bowel sounds are normal. There is no tenderness.  Musculoskeletal: She exhibits no edema.  Neurological: She is alert and oriented to person, place, and time.  Skin: Skin is warm and dry.  Psychiatric: She has a normal mood and affect.  Nursing note and vitals reviewed.   BP (!) 108/58 (BP Location: Left Arm, Patient Position: Sitting, Cuff Size: Normal)   Pulse 64   Temp 98.7 F (37.1 C) (Oral)   Resp 18   Ht  5\' 5"  (1.651 m)   Wt 192 lb 6.4 oz (87.3 kg)   SpO2 94%   BMI 32.02 kg/m  Wt Readings from Last 3 Encounters:  09/17/17 192 lb 6.4 oz (87.3 kg)  09/04/17 192 lb (87.1 kg)  08/26/17 191 lb (86.6 kg)   BP Readings from Last 3 Encounters:  09/17/17 (!) 108/58    09/04/17 (!) 135/50  08/26/17 108/60     Immunization History  Administered Date(s) Administered  . Influenza Split 12/08/2011  . Influenza Whole 10/20/2006, 11/09/2007, 11/14/2008, 10/30/2009  . Influenza, High Dose Seasonal PF 09/25/2015, 10/21/2015  . Influenza,inj,Quad PF,6+ Mos 10/20/2012, 11/28/2013, 10/02/2014, 09/18/2016  . Pneumococcal Conjugate-13 02/21/2013  . Pneumococcal Polysaccharide-23 06/30/2006  . Td 06/01/2007    Health Maintenance  Topic Date Due  . URINE MICROALBUMIN  03/01/2015  . OPHTHALMOLOGY EXAM  10/08/2015  . FOOT EXAM  04/09/2016  . TETANUS/TDAP  05/31/2017  . INFLUENZA VACCINE  08/20/2017  . HEMOGLOBIN A1C  12/17/2017  . COLONOSCOPY  11/10/2018  . DEXA SCAN  Completed  . PNA vac Low Risk Adult  Completed    Lab Results  Component Value Date   WBC 6.4 09/04/2017   HGB 12.1 09/04/2017   HCT 38.8 09/04/2017   PLT 108 (L) 09/04/2017   GLUCOSE 103 (H) 07/21/2017   CHOL 157 06/16/2017   TRIG 247.0 (H) 06/16/2017   HDL 45.90 06/16/2017   LDLDIRECT 73.0 06/16/2017   LDLCALC 69 03/19/2017   ALT 20 07/16/2017   AST 20 07/16/2017   NA 143 07/21/2017   K 4.7 07/21/2017   CL 102 07/21/2017   CREATININE 1.76 (H) 07/21/2017   BUN 41 (H) 07/21/2017   CO2 26 07/21/2017   TSH 3.45 06/16/2017   INR 1.0 09/12/2006   HGBA1C 5.6 06/16/2017   MICROALBUR 0.8 02/28/2014    Lab Results  Component Value Date   TSH 3.45 06/16/2017   Lab Results  Component Value Date   WBC 6.4 09/04/2017   HGB 12.1 09/04/2017   HCT 38.8 09/04/2017   MCV 101.8 (H) 09/04/2017   PLT 108 (L) 09/04/2017   Lab Results  Component Value Date   NA 143 07/21/2017   K 4.7 07/21/2017   CO2 26 07/21/2017   GLUCOSE 103 (H) 07/21/2017   BUN 41 (H) 07/21/2017   CREATININE 1.76 (H) 07/21/2017   BILITOT 0.7 07/16/2017   ALKPHOS 49 07/16/2017   AST 20 07/16/2017   ALT 20 07/16/2017   PROT 7.0 07/16/2017   ALBUMIN 3.9 07/16/2017   CALCIUM 9.7 07/21/2017   ANIONGAP 13  07/16/2017   GFR 24.59 (L) 06/16/2017   Lab Results  Component Value Date   CHOL 157 06/16/2017   Lab Results  Component Value Date   HDL 45.90 06/16/2017   Lab Results  Component Value Date   LDLCALC 69 03/19/2017   Lab Results  Component Value Date   TRIG 247.0 (H) 06/16/2017   Lab Results  Component Value Date   CHOLHDL 3 06/16/2017   Lab Results  Component Value Date   HGBA1C 5.6 06/16/2017         Assessment & Plan:   Problem List Items Addressed This Visit    Essential hypertension    Well controlled, no changes to meds. Encouraged heart healthy diet such as the DASH diet and exercise as tolerated.       GERD    Avoid offending foods, start probiotics. Do not eat large meals in late evening and consider  raising head of bed.       Diabetes type 2, controlled ([place])    hgba1c acceptable, minimize simple carbs. Increase exercise as tolerated.       Abdominal pain - Primary    Urine culture negative. She has notes some scant BRBPR intermittently will check hemoccults and if it continues will need referral to gastroenterolgy for further consideration      Relevant Orders   Urinalysis (Completed)   Urine Culture (Completed)   Vitamin D deficiency    Supplement and monitor         I am having [name] maintain her glucose blood, acetaminophen, Cyanocobalamin (VITAMIN B-12 PO), OXYGEN, b complex vitamins, Probiotic Product (PROBIOTIC PO), aspirin EC, montelukast, SPIRIVA HANDIHALER, loratadine, fluticasone, sertraline, potassium chloride SA, Ferrous Fumarate-Folic Acid, furosemide, cholecalciferol, albuterol, allopurinol, ranitidine, metoprolol succinate, albuterol, budesonide-formoterol, pravastatin, and traMADol.  No orders of the defined types were placed in this encounter.   CMA served as Education administrator during this visit. History, Physical and Plan performed by medical provider. Documentation and orders reviewed and attested to.  Penni Homans, MD

## 2017-09-17 NOTE — Patient Instructions (Addendum)
shingrix is the new shingles shot 2 shots over 2-6 months at pharmacy  Abdominal Pain, Adult Abdominal pain can be caused by many things. Often, abdominal pain is not serious and it gets better with no treatment or by being treated at home. However, sometimes abdominal pain is serious. Your health care provider will do a medical history and a physical exam to try to determine the cause of your abdominal pain. Follow these instructions at home:  Take over-the-counter and prescription medicines only as told by your health care provider. Do not take a laxative unless told by your health care provider.  Drink enough fluid to keep your urine clear or pale yellow.  Watch your condition for any changes.  Keep all follow-up visits as told by your health care provider. This is important. Contact a health care provider if:  Your abdominal pain changes or gets worse.  You are not hungry or you lose weight without trying.  You are constipated or have diarrhea for more than 2-3 days.  You have pain when you urinate or have a bowel movement.  Your abdominal pain wakes you up at night.  Your pain gets worse with meals, after eating, or with certain foods.  You are throwing up and cannot keep anything down.  You have a fever. Get help right away if:  Your pain does not go away as soon as your health care provider told you to expect.  You cannot stop throwing up.  Your pain is only in areas of the abdomen, such as the right side or the left lower portion of the abdomen.  You have bloody or black stools, or stools that look like tar.  You have severe pain, cramping, or bloating in your abdomen.  You have signs of dehydration, such as: ? Dark urine, very little urine, or no urine. ? Cracked lips. ? Dry mouth. ? Sunken eyes. ? Sleepiness. ? Weakness. This information is not intended to replace advice given to you by your health care provider. Make sure you discuss any questions you have  with your health care provider. Document Released: 10/16/2004 Document Revised: 07/27/2015 Document Reviewed: 06/20/2015 Elsevier Interactive Patient Education  [employer].

## 2017-09-18 LAB — URINE CULTURE
MICRO NUMBER: 91035811
SPECIMEN QUALITY:: ADEQUATE

## 2017-09-21 NOTE — Assessment & Plan Note (Signed)
Urine culture negative. She has notes some scant BRBPR intermittently will check hemoccults and if it continues will need referral to gastroenterolgy for further consideration

## 2017-09-21 NOTE — Assessment & Plan Note (Signed)
Supplement and monitor 

## 2017-09-21 NOTE — Assessment & Plan Note (Signed)
hgba1c acceptable, minimize simple carbs. Increase exercise as tolerated.  

## 2017-09-21 NOTE — Assessment & Plan Note (Signed)
Avoid offending foods, start probiotics. Do not eat large meals in late evening and consider raising head of bed.  

## 2017-09-21 NOTE — Assessment & Plan Note (Signed)
Well controlled, no changes to meds. Encouraged heart healthy diet such as the DASH diet and exercise as tolerated.  °

## 2017-10-08 ENCOUNTER — Other Ambulatory Visit: Payer: Self-pay | Admitting: Family Medicine

## 2017-10-12 ENCOUNTER — Other Ambulatory Visit: Payer: Self-pay | Admitting: Family Medicine

## 2017-10-14 ENCOUNTER — Other Ambulatory Visit: Payer: Self-pay | Admitting: Hematology

## 2017-10-14 DIAGNOSIS — N183 Chronic kidney disease, stage 3 unspecified: Secondary | ICD-10-CM

## 2017-10-14 DIAGNOSIS — D631 Anemia in chronic kidney disease: Secondary | ICD-10-CM

## 2017-10-14 NOTE — Assessment & Plan Note (Signed)
Check folate and copper levels, given the borderline macrocytic anemia. B12 normal in June 2019.

## 2017-10-16 ENCOUNTER — Encounter: Payer: Self-pay | Admitting: Hematology

## 2017-10-16 ENCOUNTER — Other Ambulatory Visit: Payer: Self-pay

## 2017-10-16 ENCOUNTER — Inpatient Hospital Stay: Payer: Medicare HMO | Attending: Family | Admitting: Hematology

## 2017-10-16 ENCOUNTER — Inpatient Hospital Stay: Payer: Medicare HMO

## 2017-10-16 VITALS — BP 113/65 | HR 86 | Temp 97.9°F | Resp 18 | Wt 194.0 lb

## 2017-10-16 DIAGNOSIS — N183 Chronic kidney disease, stage 3 unspecified: Secondary | ICD-10-CM

## 2017-10-16 DIAGNOSIS — Z7982 Long term (current) use of aspirin: Secondary | ICD-10-CM | POA: Insufficient documentation

## 2017-10-16 DIAGNOSIS — I5032 Chronic diastolic (congestive) heart failure: Secondary | ICD-10-CM | POA: Diagnosis not present

## 2017-10-16 DIAGNOSIS — D696 Thrombocytopenia, unspecified: Secondary | ICD-10-CM | POA: Diagnosis not present

## 2017-10-16 DIAGNOSIS — D508 Other iron deficiency anemias: Secondary | ICD-10-CM

## 2017-10-16 DIAGNOSIS — Z79899 Other long term (current) drug therapy: Secondary | ICD-10-CM | POA: Diagnosis not present

## 2017-10-16 DIAGNOSIS — D631 Anemia in chronic kidney disease: Secondary | ICD-10-CM

## 2017-10-16 DIAGNOSIS — E119 Type 2 diabetes mellitus without complications: Secondary | ICD-10-CM | POA: Insufficient documentation

## 2017-10-16 DIAGNOSIS — D7589 Other specified diseases of blood and blood-forming organs: Secondary | ICD-10-CM

## 2017-10-16 DIAGNOSIS — I809 Phlebitis and thrombophlebitis of unspecified site: Secondary | ICD-10-CM

## 2017-10-16 DIAGNOSIS — E118 Type 2 diabetes mellitus with unspecified complications: Secondary | ICD-10-CM

## 2017-10-16 LAB — RETICULOCYTES
RBC.: 3.37 MIL/uL — ABNORMAL LOW (ref 3.70–5.45)
RETIC COUNT ABSOLUTE: 40.4 10*3/uL (ref 33.7–90.7)
Retic Ct Pct: 1.2 % (ref 0.7–2.1)

## 2017-10-16 LAB — CBC WITH DIFFERENTIAL (CANCER CENTER ONLY)
BASOS ABS: 0 10*3/uL (ref 0.0–0.1)
Basophils Relative: 0 %
Eosinophils Absolute: 0.3 10*3/uL (ref 0.0–0.5)
Eosinophils Relative: 4 %
HEMATOCRIT: 33.9 % — AB (ref 34.8–46.6)
HEMOGLOBIN: 10.6 g/dL — AB (ref 11.6–15.9)
Lymphocytes Relative: 17 %
Lymphs Abs: 1.2 10*3/uL (ref 0.9–3.3)
MCH: 31.6 pg (ref 26.0–34.0)
MCHC: 31.3 g/dL — ABNORMAL LOW (ref 32.0–36.0)
MCV: 101.2 fL — ABNORMAL HIGH (ref 81.0–101.0)
Monocytes Absolute: 1 10*3/uL — ABNORMAL HIGH (ref 0.1–0.9)
Monocytes Relative: 14 %
NEUTROS ABS: 4.7 10*3/uL (ref 1.5–6.5)
NEUTROS PCT: 65 %
PLATELETS: 123 10*3/uL — AB (ref 145–400)
RBC: 3.35 MIL/uL — AB (ref 3.70–5.32)
RDW: 13.6 % (ref 11.1–15.7)
WBC: 7.2 10*3/uL (ref 3.9–10.0)

## 2017-10-16 LAB — FOLATE: Folate: 37.8 ng/mL (ref >5.9)

## 2017-10-16 MED ORDER — DARBEPOETIN ALFA 300 MCG/0.6ML IJ SOSY
PREFILLED_SYRINGE | INTRAMUSCULAR | Status: AC
  Filled 2017-10-16: qty 0.6

## 2017-10-16 NOTE — Assessment & Plan Note (Signed)
Hemoglobin 10.6 today, essentially stable compared to prior. No sign or symptom of abnormal bleeding or bruising. Patient has had borderline macrocytosis; B12 level normal in June 2019.  Folate, copper and iron studies pending. As her hemoglobin has remained overall stable above 10, there is no indication for ESA unless hemoglobin falls persistently below 10.

## 2017-10-16 NOTE — Progress Notes (Signed)
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 No sign or symptom of abnormal bleeding or bruising. Patient has had borderline macrocytosis; B12 level normal in June 2019.  Folate, copper and iron studies pending. As her hemoglobin has remained overall stable above 10, there is no indication for ESA unless hemoglobin falls persistently below 10.  Diabetes type 2, controlled ([place]) Patient reports her glucose being relatively well controlled.  She denies any episodes of hypoglycemia or severe hyperglycemia. I spent some time with her to discuss glucose content of various diet options.  I encouraged patient to work closely with her PCP for diabetes management.  Chronic diastolic CHF (congestive heart failure) ([place]) She appears euvolemic on exam today.  I discussed with the patient about the importance of adhering to a low-salt diet.  Thrombocytopenia ([place]) Patient has had intermittent borderline thrombocytopenia dating back to at least 2016 with platelets between 130 and 150.  She denies any symptoms of abnormal bleeding or bruising. Nutritional studies, including folate and copper, pending.  Peripheral blood smear was reviewed, which showed relatively normal platelet morphology.  There was no platelet clumping. Given the overall stable thrombocytopenia and absence of abnormal bleeding or bruising, there is no indication to pursue further work-up at this time.    Orders Placed This Encounter  Procedures  . CBC with Differential  ([facility] Only)    Standing Status:   Future    Standing Expiration Date:   11/20/2018  . CMP ([facility] only)    Standing Status:   Future    Standing Expiration Date:   11/20/2018  . Ferritin    Standing Status:   Future    Standing Expiration Date:   11/20/2018  . Iron and TIBC    Standing Status:   Future    Standing Expiration Date:   11/20/2018  . Technologist smear review    Standing Status:   Future    Standing Expiration Date:   10/17/2018    All questions were answered. The patient knows to call the clinic with any problems, questions or concerns. No barriers to learning was detected.  A total of more than 25 minutes were spent face-to-face with the patient during this encounter and over half of that time was spent on counseling and coordination of care as outlined above.   Tish Men, MD 10/16/2017 3:59 PM  CHIEF COMPLAINT: "I am here for anemia"  INTERVAL HISTORY: [name] returns to clinic for follow-up of anemia secondary to chronic kidney disease and chronic thrombocytopenia.   She reports that her breathing has been overall stable, and she remains on 2 L supplemental O2.  Her bilateral lower extremity swelling is overall unchanged. The patient denies any recent signs or symptoms of bleeding such as spontaneous epistaxis, hematuria or hematochezia.  SUMMARY OF ONCOLOGIC HISTORY:  No history exists.    REVIEW OF SYSTEMS:   Constitutional: ( - ) fevers, ( - )  chills , ( - ) night sweats Eyes: ( - ) blurriness of vision, ( - ) double vision, ( - ) watery eyes Ears, nose, mouth, throat,  and face: ( - ) mucositis, ( - ) sore throat Respiratory: ( - ) cough, ( - ) wheezes Cardiovascular: ( - ) palpitation, ( - ) chest discomfort Gastrointestinal:  ( - ) nausea, ( - ) heartburn, ( - ) change in bowel habits Skin: ( - ) abnormal skin rashes Lymphatics: ( - ) new lymphadenopathy, ( - ) easy bruising Neurological: ( - ) numbness, ( - ) tingling, ( - ) new  weaknesses Behavioral/Psych: ( - ) mood change, ( - ) new changes  All other systems were reviewed with the patient and are negative.  I have reviewed the past medical history, past surgical history, social history and family history with the patient and they are unchanged from previous note.  ALLERGIES:  is allergic to oseltamivir phosphate; sulfa antibiotics; ace inhibitors; indomethacin; montelukast sodium; and sulfonamide derivatives.  MEDICATIONS:  Current Outpatient Medications  Medication Sig Dispense Refill  . acetaminophen (TYLENOL) 500 MG tablet Take 500 mg by mouth every 6 (six) hours as needed.    Marland Kitchen albuterol (ACCUNEB) 0.63 MG/3ML nebulizer solution Take 3 mLs (0.63 mg total) by nebulization every 6 (six) hours as needed for wheezing or shortness of breath. 150 mL 0  . albuterol (PROVENTIL HFA;VENTOLIN HFA) 108 (90 Base) MCG/ACT inhaler INHALE 2 PUFFS INTO THE LUNGS 4 (FOUR) TIMES DAILY AS NEEDED FOR WHEEZING OR SHORTNESS OF BREATH. 54 g 0  . allopurinol (ZYLOPRIM) 100 MG tablet TAKE 2 TABLETS ONE TIME DAILY 180 tablet 1  . aspirin EC 81 MG tablet Take 1 tablet (81 mg total) by mouth daily.    Marland Kitchen b complex vitamins tablet Take 1 tablet by mouth daily.    . budesonide-formoterol (SYMBICORT) 160-4.5 MCG/ACT inhaler INHALE 2 PUFFS INTO THE LUNGS 2 TIMES DAILY 3 Inhaler 1  . cholecalciferol (VITAMIN D) 1000 units tablet Take 1,000 Units daily by mouth.    . Cyanocobalamin (VITAMIN B-12 PO) Take by mouth daily.    . Ferrous Fumarate-Folic Acid (HEMOCYTE-F) 324-1 MG TABS Take 1 tablet by mouth daily. 90 each 1  . fluticasone (FLONASE) 50 MCG/ACT nasal spray Place 1 spray into both nostrils daily. 16 g 3  . furosemide (LASIX) 40 MG tablet TAKE 1 TABLET BY MOUTH DAILY AND A 2ND TABLET DAILY AS NEEDED FOR INCREASED EDEMA OR WEIGHT GAIN >3 LBS/24 HOUR 100 tablet 5  . glucose blood (ONE TOUCH TEST STRIPS) test strip Use as instructed to check blood sugar once weekly. DX 790.29     .  loratadine (CLARITIN) 10 MG tablet Take 1 tablet (10 mg total) by mouth at bedtime. 30 tablet 5  . metoprolol succinate (TOPROL-XL) 50 MG 24 hr tablet TAKE 1 AND 1/2 TABLETS EVERY DAY 135 tablet 3  . montelukast (SINGULAIR) 10 MG tablet Take 1 tablet (10 mg total) by mouth at bedtime as needed. 30 tablet 3  . OXYGEN Inhale into the lungs.    . potassium chloride SA (K-DUR,KLOR-CON) 20 MEQ tablet Take 1 tablet (20 mEq total) by mouth daily. And a 2nd tab po daily prn extra Furosemide tab 100 tablet 5  . pravastatin (PRAVACHOL) 10 MG tablet TAKE 1 TABLET BY MOUTH ONCE DAILY 90 tablet 1  . Probiotic Product (PROBIOTIC PO) Take by mouth daily.    . ranitidine (ZANTAC) 150 MG tablet TAKE 1 TABLET TWICE DAILY 180 tablet 1  . sertraline (ZOLOFT) 100 MG tablet Take 1 tablet (100 mg total) by mouth daily. 90 tablet 1  . SPIRIVA HANDIHALER 18 MCG  inhalation capsule PLACE 1 CAPSULE (18 MCG TOTAL) INTO INHALER AND INHALE CONTENTS ONE TIME DAILY. 90 capsule 1  . traMADol (ULTRAM) 50 MG tablet TAKE 1 TABLET BY MOUTH THREE TIMES DAILY AS NEEDED 90 tablet 0   No current facility-administered medications for this visit.     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, no ulcers or oozing  EYES: conjunctiva are pink and non-injected, sclera clear OROPHARYNX: no exudate, no erythema; lips, buccal mucosa, and tongue normal  NECK: supple, non-tender LYMPH:  no palpable lymphadenopathy in the cervical region  LUNGS: clear to auscultation and percussion with normal breathing effort HEART: regular rate & rhythm and no murmurs, 2+ bilateral lower extremity edema  ABDOMEN: soft,  non-tender, non-distended, normal bowel sounds Musculoskeletal: no cyanosis of digits and no clubbing  PSYCH: alert & oriented x 3, fluent speech NEURO: no focal motor/sensory deficits  LABORATORY DATA:  I have reviewed the data as listed    Component Value Date/Time   NA 143 07/21/2017 1501   K 4.7 07/21/2017 1501   CL 102 07/21/2017 1501   CO2 26 07/21/2017 1501   GLUCOSE 103 (H) 07/21/2017 1501   GLUCOSE 99 07/16/2017 1508   GLUCOSE 102 (H) 01/27/2006 1037   BUN 41 (H) 07/21/2017 1501   CREATININE 1.76 (H) 07/21/2017 1501   CREATININE 2.00 (H) 07/16/2017 1508   CREATININE 1.36 (H) 08/23/2013 1134   CALCIUM 9.7 07/21/2017 1501   PROT 7.0 07/16/2017 1508   ALBUMIN 3.9 07/16/2017 1508   AST 20 07/16/2017 1508   ALT 20 07/16/2017 1508   ALKPHOS 49 07/16/2017 1508   BILITOT 0.7 07/16/2017 1508   GFRNONAA 28 (L) 07/21/2017 1501   GFRAA 32 (L) 07/21/2017 1501    No results found for: SPEP, UPEP  Lab Results  Component Value Date   WBC 7.2 10/16/2017   NEUTROABS 4.7 10/16/2017   HGB 10.6 (L) 10/16/2017   HCT 33.9 (L) 10/16/2017   MCV 101.2 (H) 10/16/2017   PLT 123 (L) 10/16/2017      Chemistry      Component Value Date/Time   NA 143 07/21/2017 1501   K 4.7 07/21/2017 1501   CL 102 07/21/2017 1501   CO2 26 07/21/2017 1501   BUN 41 (H) 07/21/2017 1501   CREATININE 1.76 (H) 07/21/2017 1501   CREATININE 2.00 (H) 07/16/2017 1508   CREATININE 1.36 (H) 08/23/2013 1134      Component Value Date/Time   CALCIUM 9.7 07/21/2017 1501   ALKPHOS 49 07/16/2017 1508   AST 20 07/16/2017 1508   ALT 20 07/16/2017 1508   BILITOT 0.7 07/16/2017 1508     I personally reviewed the patient's peripheral blood smear today.  There was no peripheral blast.  The white blood cells and red blood cells were of normal morphology. There was no schistocytosis or anisocytosis.  The platelets were of normal size and I verified that there were no platelet clumping.

## 2017-10-16 NOTE — Assessment & Plan Note (Signed)
Patient reports her glucose being relatively well controlled.  She denies any episodes of hypoglycemia or severe hyperglycemia. I spent some time with her to discuss glucose content of various diet options.  I encouraged patient to work closely with her PCP for diabetes management.

## 2017-10-16 NOTE — Assessment & Plan Note (Addendum)
Patient has had intermittent borderline thrombocytopenia dating back to at least 2016 with platelets between 130 and 150.  She denies any symptoms of abnormal bleeding or bruising. Nutritional studies, including folate and copper, pending.  Peripheral blood smear was reviewed, which showed relatively normal platelet morphology.  There was no platelet clumping. Given the overall stable thrombocytopenia and absence of abnormal bleeding or bruising, there is no indication to pursue further work-up at this time.

## 2017-10-16 NOTE — Assessment & Plan Note (Signed)
She appears euvolemic on exam today.  I discussed with the patient about the importance of adhering to a low-salt diet.

## 2017-10-19 LAB — IRON AND TIBC
IRON: 68 ug/dL (ref 41–142)
SATURATION RATIOS: 27 % (ref 21–57)
TIBC: 254 ug/dL (ref 236–444)
UIBC: 186 ug/dL

## 2017-10-19 LAB — FERRITIN: FERRITIN: 368 ng/mL — AB (ref 11–307)

## 2017-10-20 LAB — COPPER, SERUM: Copper: 96 ug/dL (ref 72–166)

## 2017-10-26 ENCOUNTER — Ambulatory Visit (INDEPENDENT_AMBULATORY_CARE_PROVIDER_SITE_OTHER): Payer: Medicare HMO

## 2017-10-26 DIAGNOSIS — Z23 Encounter for immunization: Secondary | ICD-10-CM | POA: Diagnosis not present

## 2017-10-27 ENCOUNTER — Other Ambulatory Visit: Payer: Self-pay | Admitting: Family Medicine

## 2017-10-29 ENCOUNTER — Telehealth: Payer: Self-pay

## 2017-10-29 NOTE — Telephone Encounter (Signed)
Pt is requesting refill on tramadol.   Last OV: 09/17/2017 Last Fill: 09/17/2017 #90 and 0RF UDS: 12/23/2016 Low risk

## 2017-10-29 NOTE — Telephone Encounter (Signed)
Copied from [place] [id number]. Topic: Referral - Request for Referral >> Oct 29, 2017 12:41 PM Yvette Rack wrote: Has patient seen PCP for this complaint? Yes.   *If NO, is insurance requiring patient see PCP for this issue before PCP can refer them? Referral for which specialty: [facility] Preferred provider/office: Dr Marlou Sa Reason for referral: rt knee its hard to straighten out

## 2017-11-04 ENCOUNTER — Emergency Department (HOSPITAL_COMMUNITY): Payer: Medicare HMO

## 2017-11-04 ENCOUNTER — Inpatient Hospital Stay (HOSPITAL_COMMUNITY)
Admission: EM | Admit: 2017-11-04 | Discharge: 2017-11-07 | DRG: 083 | Disposition: A | Discharge status: Home Health | Payer: Medicare HMO | Attending: Internal Medicine | Admitting: Internal Medicine

## 2017-11-04 ENCOUNTER — Other Ambulatory Visit: Payer: Self-pay

## 2017-11-04 ENCOUNTER — Encounter (HOSPITAL_COMMUNITY): Payer: Self-pay | Admitting: Emergency Medicine

## 2017-11-04 ENCOUNTER — Ambulatory Visit: Payer: Self-pay

## 2017-11-04 DIAGNOSIS — D696 Thrombocytopenia, unspecified: Secondary | ICD-10-CM | POA: Diagnosis not present

## 2017-11-04 DIAGNOSIS — I5042 Chronic combined systolic (congestive) and diastolic (congestive) heart failure: Secondary | ICD-10-CM | POA: Diagnosis not present

## 2017-11-04 DIAGNOSIS — M8580 Other specified disorders of bone density and structure, unspecified site: Secondary | ICD-10-CM | POA: Diagnosis present

## 2017-11-04 DIAGNOSIS — S0012XA Contusion of left eyelid and periocular area, initial encounter: Secondary | ICD-10-CM | POA: Diagnosis present

## 2017-11-04 DIAGNOSIS — Z801 Family history of malignant neoplasm of trachea, bronchus and lung: Secondary | ICD-10-CM

## 2017-11-04 DIAGNOSIS — E785 Hyperlipidemia, unspecified: Secondary | ICD-10-CM | POA: Diagnosis present

## 2017-11-04 DIAGNOSIS — J449 Chronic obstructive pulmonary disease, unspecified: Secondary | ICD-10-CM | POA: Diagnosis present

## 2017-11-04 DIAGNOSIS — R55 Syncope and collapse: Secondary | ICD-10-CM | POA: Diagnosis present

## 2017-11-04 DIAGNOSIS — H3530 Unspecified macular degeneration: Secondary | ICD-10-CM | POA: Diagnosis present

## 2017-11-04 DIAGNOSIS — N184 Chronic kidney disease, stage 4 (severe): Secondary | ICD-10-CM | POA: Diagnosis present

## 2017-11-04 DIAGNOSIS — Z8371 Family history of colonic polyps: Secondary | ICD-10-CM

## 2017-11-04 DIAGNOSIS — W19XXXA Unspecified fall, initial encounter: Secondary | ICD-10-CM | POA: Diagnosis not present

## 2017-11-04 DIAGNOSIS — Z66 Do not resuscitate: Secondary | ICD-10-CM | POA: Diagnosis present

## 2017-11-04 DIAGNOSIS — Z820 Family history of epilepsy and other diseases of the nervous system: Secondary | ICD-10-CM

## 2017-11-04 DIAGNOSIS — S0003XA Contusion of scalp, initial encounter: Secondary | ICD-10-CM | POA: Diagnosis not present

## 2017-11-04 DIAGNOSIS — S79911A Unspecified injury of right hip, initial encounter: Secondary | ICD-10-CM | POA: Diagnosis not present

## 2017-11-04 DIAGNOSIS — S199XXA Unspecified injury of neck, initial encounter: Secondary | ICD-10-CM | POA: Diagnosis not present

## 2017-11-04 DIAGNOSIS — Z9842 Cataract extraction status, left eye: Secondary | ICD-10-CM

## 2017-11-04 DIAGNOSIS — M353 Polymyalgia rheumatica: Secondary | ICD-10-CM | POA: Diagnosis present

## 2017-11-04 DIAGNOSIS — I629 Nontraumatic intracranial hemorrhage, unspecified: Secondary | ICD-10-CM | POA: Diagnosis not present

## 2017-11-04 DIAGNOSIS — Z888 Allergy status to other drugs, medicaments and biological substances status: Secondary | ICD-10-CM

## 2017-11-04 DIAGNOSIS — S065X0A Traumatic subdural hemorrhage without loss of consciousness, initial encounter: Secondary | ICD-10-CM | POA: Diagnosis not present

## 2017-11-04 DIAGNOSIS — Z811 Family history of alcohol abuse and dependence: Secondary | ICD-10-CM

## 2017-11-04 DIAGNOSIS — Z9841 Cataract extraction status, right eye: Secondary | ICD-10-CM

## 2017-11-04 DIAGNOSIS — Z86718 Personal history of other venous thrombosis and embolism: Secondary | ICD-10-CM

## 2017-11-04 DIAGNOSIS — M109 Gout, unspecified: Secondary | ICD-10-CM | POA: Diagnosis present

## 2017-11-04 DIAGNOSIS — W010XXA Fall on same level from slipping, tripping and stumbling without subsequent striking against object, initial encounter: Secondary | ICD-10-CM | POA: Diagnosis present

## 2017-11-04 DIAGNOSIS — Z9981 Dependence on supplemental oxygen: Secondary | ICD-10-CM

## 2017-11-04 DIAGNOSIS — R52 Pain, unspecified: Secondary | ICD-10-CM | POA: Diagnosis not present

## 2017-11-04 DIAGNOSIS — K219 Gastro-esophageal reflux disease without esophagitis: Secondary | ICD-10-CM | POA: Diagnosis present

## 2017-11-04 DIAGNOSIS — I10 Essential (primary) hypertension: Secondary | ICD-10-CM | POA: Diagnosis present

## 2017-11-04 DIAGNOSIS — Z79899 Other long term (current) drug therapy: Secondary | ICD-10-CM | POA: Diagnosis not present

## 2017-11-04 DIAGNOSIS — Z8349 Family history of other endocrine, nutritional and metabolic diseases: Secondary | ICD-10-CM

## 2017-11-04 DIAGNOSIS — I5030 Unspecified diastolic (congestive) heart failure: Secondary | ICD-10-CM | POA: Diagnosis not present

## 2017-11-04 DIAGNOSIS — Z882 Allergy status to sulfonamides status: Secondary | ICD-10-CM

## 2017-11-04 DIAGNOSIS — M542 Cervicalgia: Secondary | ICD-10-CM | POA: Diagnosis not present

## 2017-11-04 DIAGNOSIS — R0602 Shortness of breath: Secondary | ICD-10-CM | POA: Diagnosis not present

## 2017-11-04 DIAGNOSIS — S065X9A Traumatic subdural hemorrhage with loss of consciousness of unspecified duration, initial encounter: Secondary | ICD-10-CM | POA: Diagnosis present

## 2017-11-04 DIAGNOSIS — Z6832 Body mass index (BMI) 32.0-32.9, adult: Secondary | ICD-10-CM

## 2017-11-04 DIAGNOSIS — D631 Anemia in chronic kidney disease: Secondary | ICD-10-CM | POA: Diagnosis present

## 2017-11-04 DIAGNOSIS — Z825 Family history of asthma and other chronic lower respiratory diseases: Secondary | ICD-10-CM

## 2017-11-04 DIAGNOSIS — E669 Obesity, unspecified: Secondary | ICD-10-CM | POA: Diagnosis present

## 2017-11-04 DIAGNOSIS — Z8049 Family history of malignant neoplasm of other genital organs: Secondary | ICD-10-CM

## 2017-11-04 DIAGNOSIS — S299XXA Unspecified injury of thorax, initial encounter: Secondary | ICD-10-CM | POA: Diagnosis not present

## 2017-11-04 DIAGNOSIS — I609 Nontraumatic subarachnoid hemorrhage, unspecified: Secondary | ICD-10-CM

## 2017-11-04 DIAGNOSIS — I130 Hypertensive heart and chronic kidney disease with heart failure and stage 1 through stage 4 chronic kidney disease, or unspecified chronic kidney disease: Secondary | ICD-10-CM | POA: Diagnosis present

## 2017-11-04 DIAGNOSIS — E1122 Type 2 diabetes mellitus with diabetic chronic kidney disease: Secondary | ICD-10-CM | POA: Diagnosis present

## 2017-11-04 DIAGNOSIS — S066X9A Traumatic subarachnoid hemorrhage with loss of consciousness of unspecified duration, initial encounter: Principal | ICD-10-CM | POA: Diagnosis present

## 2017-11-04 DIAGNOSIS — M25551 Pain in right hip: Secondary | ICD-10-CM | POA: Diagnosis not present

## 2017-11-04 DIAGNOSIS — Z8249 Family history of ischemic heart disease and other diseases of the circulatory system: Secondary | ICD-10-CM

## 2017-11-04 DIAGNOSIS — Z818 Family history of other mental and behavioral disorders: Secondary | ICD-10-CM

## 2017-11-04 DIAGNOSIS — Z8261 Family history of arthritis: Secondary | ICD-10-CM

## 2017-11-04 DIAGNOSIS — R05 Cough: Secondary | ICD-10-CM | POA: Diagnosis not present

## 2017-11-04 DIAGNOSIS — I252 Old myocardial infarction: Secondary | ICD-10-CM

## 2017-11-04 DIAGNOSIS — S066X0A Traumatic subarachnoid hemorrhage without loss of consciousness, initial encounter: Secondary | ICD-10-CM | POA: Diagnosis not present

## 2017-11-04 DIAGNOSIS — E119 Type 2 diabetes mellitus without complications: Secondary | ICD-10-CM

## 2017-11-04 HISTORY — DX: Nontraumatic intracranial hemorrhage, unspecified: I62.9

## 2017-11-04 LAB — COMPREHENSIVE METABOLIC PANEL
ALT: 16 U/L (ref 0–44)
ANION GAP: 6 (ref 5–15)
AST: 21 U/L (ref 15–41)
Albumin: 3.9 g/dL (ref 3.5–5.0)
Alkaline Phosphatase: 44 U/L (ref 38–126)
BILIRUBIN TOTAL: 0.7 mg/dL (ref 0.3–1.2)
BUN: 29 mg/dL — ABNORMAL HIGH (ref 8–23)
CO2: 28 mmol/L (ref 22–32)
Calcium: 9.6 mg/dL (ref 8.9–10.3)
Chloride: 105 mmol/L (ref 98–111)
Creatinine, Ser: 1.62 mg/dL — ABNORMAL HIGH (ref 0.44–1.00)
GFR calc Af Amer: 34 mL/min — ABNORMAL LOW (ref >60)
GFR, EST NON AFRICAN AMERICAN: 30 mL/min — AB (ref >60)
Glucose, Bld: 95 mg/dL (ref 70–99)
POTASSIUM: 5.2 mmol/L — AB (ref 3.5–5.1)
Sodium: 139 mmol/L (ref 135–145)
TOTAL PROTEIN: 6.6 g/dL (ref 6.5–8.1)

## 2017-11-04 LAB — CBC WITH DIFFERENTIAL/PLATELET
Abs Immature Granulocytes: 0.02 10*3/uL (ref 0.00–0.07)
Basophils Absolute: 0 10*3/uL (ref 0.0–0.1)
Basophils Relative: 1 %
EOS ABS: 0.2 10*3/uL (ref 0.0–0.5)
EOS PCT: 3 %
HEMATOCRIT: 32.9 % — AB (ref 36.0–46.0)
Hemoglobin: 10.1 g/dL — ABNORMAL LOW (ref 12.0–15.0)
Immature Granulocytes: 0 %
Lymphocytes Relative: 12 %
Lymphs Abs: 0.7 10*3/uL (ref 0.7–4.0)
MCH: 31.2 pg (ref 26.0–34.0)
MCHC: 30.7 g/dL (ref 30.0–36.0)
MCV: 101.5 fL — AB (ref 80.0–100.0)
MONOS PCT: 12 %
Monocytes Absolute: 0.8 10*3/uL (ref 0.1–1.0)
NRBC: 0 % (ref 0.0–0.2)
Neutro Abs: 4.5 10*3/uL (ref 1.7–7.7)
Neutrophils Relative %: 72 %
Platelets: 119 10*3/uL — ABNORMAL LOW (ref 150–400)
RBC: 3.24 MIL/uL — ABNORMAL LOW (ref 3.87–5.11)
RDW: 14.5 % (ref 11.5–15.5)
WBC: 6.2 10*3/uL (ref 4.0–10.5)

## 2017-11-04 MED ORDER — PRAVASTATIN SODIUM 10 MG PO TABS
10.0000 mg | ORAL_TABLET | Freq: Every day | ORAL | Status: DC
  Administered 2017-11-05 – 2017-11-07 (×3): 10 mg via ORAL
  Filled 2017-11-04 (×3): qty 1

## 2017-11-04 MED ORDER — METOPROLOL SUCCINATE ER 50 MG PO TB24
75.0000 mg | ORAL_TABLET | Freq: Every day | ORAL | Status: DC
  Administered 2017-11-05 – 2017-11-07 (×3): 75 mg via ORAL
  Filled 2017-11-04 (×3): qty 1

## 2017-11-04 MED ORDER — ONDANSETRON HCL 4 MG/2ML IJ SOLN: 4.0000 mg | Freq: Four times a day (QID) | INTRAMUSCULAR | Status: DC | PRN

## 2017-11-04 MED ORDER — ONDANSETRON HCL 4 MG PO TABS: 4.0000 mg | ORAL_TABLET | Freq: Four times a day (QID) | ORAL | Status: DC | PRN

## 2017-11-04 MED ORDER — ALBUTEROL SULFATE (2.5 MG/3ML) 0.083% IN NEBU
3.0000 mL | INHALATION_SOLUTION | Freq: Four times a day (QID) | RESPIRATORY_TRACT | Status: DC | PRN
  Administered 2017-11-04 – 2017-11-05 (×2): 3 mL via RESPIRATORY_TRACT
  Filled 2017-11-04 (×2): qty 3

## 2017-11-04 MED ORDER — MOMETASONE FURO-FORMOTEROL FUM 200-5 MCG/ACT IN AERO
2.0000 | INHALATION_SPRAY | Freq: Two times a day (BID) | RESPIRATORY_TRACT | Status: DC
  Administered 2017-11-05 – 2017-11-07 (×5): 2 via RESPIRATORY_TRACT
  Filled 2017-11-04: qty 8.8

## 2017-11-04 MED ORDER — SERTRALINE HCL 100 MG PO TABS
100.0000 mg | ORAL_TABLET | Freq: Every day | ORAL | Status: DC
  Administered 2017-11-05 – 2017-11-07 (×3): 100 mg via ORAL
  Filled 2017-11-04 (×3): qty 1

## 2017-11-04 MED ORDER — DOCUSATE SODIUM 100 MG PO CAPS
100.0000 mg | ORAL_CAPSULE | Freq: Two times a day (BID) | ORAL | Status: DC
  Administered 2017-11-04 – 2017-11-07 (×6): 100 mg via ORAL
  Filled 2017-11-04 (×6): qty 1

## 2017-11-04 MED ORDER — SODIUM CHLORIDE 0.9% FLUSH
3.0000 mL | Freq: Two times a day (BID) | INTRAVENOUS | Status: DC
  Administered 2017-11-04 – 2017-11-07 (×5): 3 mL via INTRAVENOUS

## 2017-11-04 MED ORDER — ACETAMINOPHEN 325 MG PO TABS
650.0000 mg | ORAL_TABLET | Freq: Four times a day (QID) | ORAL | Status: DC | PRN
  Administered 2017-11-04 – 2017-11-06 (×9): 650 mg via ORAL
  Filled 2017-11-04 (×9): qty 2

## 2017-11-04 MED ORDER — ACETAMINOPHEN 650 MG RE SUPP: 650.0000 mg | Freq: Four times a day (QID) | RECTAL | Status: DC | PRN

## 2017-11-04 MED ORDER — ALLOPURINOL 100 MG PO TABS
200.0000 mg | ORAL_TABLET | Freq: Every day | ORAL | Status: DC
  Administered 2017-11-05 – 2017-11-07 (×3): 200 mg via ORAL
  Filled 2017-11-04 (×3): qty 2

## 2017-11-04 MED ORDER — MORPHINE SULFATE (PF) 2 MG/ML IV SOLN
2.0000 mg | INTRAVENOUS | Status: DC | PRN
  Administered 2017-11-06 (×2): 2 mg via INTRAVENOUS
  Filled 2017-11-04 (×2): qty 1

## 2017-11-04 NOTE — Consult Note (Signed)
Reason for Consult: Intracranial hemorrhage Referring Physician: Dr. Alain Marion is an 77 y.o. female.  HPI: The patient is a 77 year old white female who had a syncopal episode today following the ground striking her head.  She came to the most gone the ER was worked up with head CT scan which demonstrates a left subarachnoid hemorrhage and a small right subdural hematoma.  A neurosurgical consultation was requested.  Presently the patient is alert and pleasant.  She complains of a headache and some mild neck pain.  She does not take any anticoagulants.  She denies chest pain, palpitations, etc.  Past Medical History:  Diagnosis Date  . Abnormal glucose tolerance test   . Abnormal TSH 09/22/2016  . ACE-inhibitor cough   . Anemia 03/12/2014  . Arthritis   . Asthma    PFT 02/06/09 FEV1 1.41 (65%), FVC 1.92 (64), FEV1% 74, TLC 3.47 (71%), DLCO 48%, +BD  . Atypical chest pain    s/p cath, Normal coronaries, Non ST elevation myocardial infarction, Rt groin pseudoaneurysm  . Bacterial vaginosis 03/12/2014  . Cellulitis 06/22/2016  . Chronic kidney disease (CKD), stage III (moderate) ([place]) 08/04/2016  . COPD (chronic obstructive pulmonary disease) ([place])   . Depression   . Diastolic heart failure ([place]) 04/07/2016  . DVT (deep venous thrombosis) ([place]) 1987  . Fall at home 12/04/2014  . GERD (gastroesophageal reflux disease)   . Gout   . Hypercalcemia 10/15/2014  . Hyperlipidemia   . Hypertension   . Hypoxia 10/15/2014  . Macular degeneration 04/10/2015  . Osteopenia 12/29/2006   Qualifier: Diagnosis of  By: Wynona Luna   . Pneumonia   . Polymyalgia rheumatica ([place]) 01/07/2016  . Renal insufficiency 09/22/2016  . Sun-damaged skin 10/24/2012  . Unspecified constipation 06/05/2013  . Urinary frequency 10/24/2012  . UTI (lower urinary tract infection) 10/24/2012  . Vitamin D deficiency 01/01/2015    Past Surgical History:  Procedure Laterality Date  . APPENDECTOMY  1951  .  TONSILLECTOMY  1950  . TUBAL LIGATION  1968    Family History  Problem Relation Age of Onset  . Asthma Sister   . Arthritis Sister   . Hypertension Sister   . Hyperlipidemia Sister   . Uterine cancer Sister   . Coronary artery disease Brother        x2  . Arthritis Brother   . Lung cancer Brother        smoker  . Hypertension Sister   . Arthritis Sister   . Hyperlipidemia Sister   . Emphysema Sister   . COPD Sister        smoker  . Heart disease Sister   . Hyperlipidemia Sister   . Hypertension Sister   . Arthritis Sister   . Emphysema Brother   . Heart disease Brother         smoker  . Heart attack Brother   . Mental illness Father   . Alcohol abuse Father   . Suicidality Father   . Heart disease Mother   . Hyperlipidemia Mother   . Heart attack Mother   . Epilepsy Daughter   . Hypertension Daughter   . Obesity Daughter   . COPD Brother        smoker  . Lung cancer Brother   . Coronary artery disease Other   . Colon polyps Sister     Social History:  reports that she has never smoked. She has never used smokeless tobacco. She reports  that she does not drink alcohol or use drugs.  Allergies:  Allergies  Allergen Reactions  . Oseltamivir Phosphate     TAMIFLU REACTION: nausea, vomiting, diarrhea, dizziness  . Sulfa Antibiotics Other (See Comments)    CP  . Ace Inhibitors   . Indomethacin   . Montelukast Sodium     REACTION: Heart palpitations, chest pain  . Sulfonamide Derivatives     Medications:  I have reviewed the patient's current medications. Prior to Admission:  (Not in a hospital admission) Scheduled: Continuous: PRN: Anti-infectives (From admission, onward)   None       Results for orders placed or performed during the hospital encounter of 11/04/17 (from the past 48 hour(s))  CBC with Differential/Platelet     Status: Abnormal   Collection Time: 11/04/17 11:54 AM  Result Value Ref Range   WBC 6.2 4.0 - 10.5 K/uL   RBC 3.24 (L)  3.87 - 5.11 MIL/uL   Hemoglobin 10.1 (L) 12.0 - 15.0 g/dL   HCT 32.9 (L) 36.0 - 46.0 %   MCV 101.5 (H) 80.0 - 100.0 fL   MCH 31.2 26.0 - 34.0 pg   MCHC 30.7 30.0 - 36.0 g/dL   RDW 14.5 11.5 - 15.5 %   Platelets 119 (L) 150 - 400 K/uL    Comment: REPEATED TO VERIFY PLATELET COUNT CONFIRMED BY SMEAR Immature Platelet Fraction may be clinically indicated, consider ordering this additional test [id number]    nRBC 0.0 0.0 - 0.2 %   Neutrophils Relative % 72 %   Neutro Abs 4.5 1.7 - 7.7 K/uL   Lymphocytes Relative 12 %   Lymphs Abs 0.7 0.7 - 4.0 K/uL   Monocytes Relative 12 %   Monocytes Absolute 0.8 0.1 - 1.0 K/uL   Eosinophils Relative 3 %   Eosinophils Absolute 0.2 0.0 - 0.5 K/uL   Basophils Relative 1 %   Basophils Absolute 0.0 0.0 - 0.1 K/uL   Immature Granulocytes 0 %   Abs Immature Granulocytes 0.02 0.00 - 0.07 K/uL    Comment: Performed at [facility], [address] [place], [place] [address]  Comprehensive metabolic panel     Status: Abnormal   Collection Time: 11/04/17 11:54 AM  Result Value Ref Range   Sodium 139 135 - 145 mmol/L   Potassium 5.2 (H) 3.5 - 5.1 mmol/L   Chloride 105 98 - 111 mmol/L   CO2 28 22 - 32 mmol/L   Glucose, Bld 95 70 - 99 mg/dL   BUN 29 (H) 8 - 23 mg/dL   Creatinine, Ser 1.62 (H) 0.44 - 1.00 mg/dL   Calcium 9.6 8.9 - 10.3 mg/dL   Total Protein 6.6 6.5 - 8.1 g/dL   Albumin 3.9 3.5 - 5.0 g/dL   AST 21 15 - 41 U/L   ALT 16 0 - 44 U/L   Alkaline Phosphatase 44 38 - 126 U/L   Total Bilirubin 0.7 0.3 - 1.2 mg/dL   GFR calc non Af Amer 30 (L) >60 mL/min   GFR calc Af Amer 34 (L) >60 mL/min    Comment: (NOTE) The eGFR has been calculated using the CKD EPI equation. This calculation has not been validated in all clinical situations. eGFR's persistently <60 mL/min signify possible Chronic Kidney Disease.    Anion gap 6 5 - 15    Comment: Performed at [facility] [address] [place], [place] [address]    Dg Chest 2  View  Result Date: 11/04/2017  CLINICAL DATA:  Cough.  Shortness of breath.  Fall. EXAM: CHEST - 2 VIEW COMPARISON:  04/16/2016 FINDINGS: There is slight peribronchial thickening which is chronic or recurrent. Overall heart size and pulmonary vascularity are normal. Chronic slight elevation of the left hemidiaphragm. No acute bone abnormalities. IMPRESSION: Slight bronchitic changes which could be recurrent or chronic. Electronically Signed   By: Lorriane Shire M.D.   On: 11/04/2017 12:49   Ct Head Wo Contrast  Result Date: 11/04/2017 CLINICAL DATA:  Posttraumatic headache and neck pain after fall. EXAM: CT HEAD WITHOUT CONTRAST CT CERVICAL SPINE WITHOUT CONTRAST TECHNIQUE: Multidetector CT imaging of the head and cervical spine was performed following the standard protocol without intravenous contrast. Multiplanar CT image reconstructions of the cervical spine were also generated. COMPARISON:  CT scan of October 22, 2016. FINDINGS: CT HEAD FINDINGS Brain: Small amount of subarachnoid hemorrhage is noted in the left frontal lobe. No mass effect or midline shift is noted. Ventricular size is within normal limits. No acute infarction or mass lesion is noted. Vascular: No hyperdense vessel or unexpected calcification. Skull: Normal. Negative for fracture or focal lesion. Sinuses/Orbits: No acute finding. Other: Small right frontal scalp hematoma is noted. CT CERVICAL SPINE FINDINGS Alignment: Minimal grade 1 anterolisthesis of C5-6 is noted secondary to posterior facet joint hypertrophy. Skull base and vertebrae: No acute fracture. No primary bone lesion or focal pathologic process. Soft tissues and spinal canal: No prevertebral fluid or swelling. No visible canal hematoma. Disc levels: Severe degenerative disc disease is noted extending from C3-4 to C6-7. Upper chest: Negative. Other: None. IMPRESSION: Small focus of subarachnoid hemorrhage seen in left frontal lobe. Critical Value/emergent results were called  by telephone at the time of interpretation on 11/04/2017 at 1:06 pm to Dr. Julianne Rice , who verbally acknowledged these results. Severe multilevel degenerative disc disease. No acute abnormality seen in the cervical spine. Electronically Signed   By: Marijo Conception, M.D.   On: 11/04/2017 13:06   Ct Cervical Spine Wo Contrast  Result Date: 11/04/2017 CLINICAL DATA:  Posttraumatic headache and neck pain after fall. EXAM: CT HEAD WITHOUT CONTRAST CT CERVICAL SPINE WITHOUT CONTRAST TECHNIQUE: Multidetector CT imaging of the head and cervical spine was performed following the standard protocol without intravenous contrast. Multiplanar CT image reconstructions of the cervical spine were also generated. COMPARISON:  CT scan of October 22, 2016. FINDINGS: CT HEAD FINDINGS Brain: Small amount of subarachnoid hemorrhage is noted in the left frontal lobe. No mass effect or midline shift is noted. Ventricular size is within normal limits. No acute infarction or mass lesion is noted. Vascular: No hyperdense vessel or unexpected calcification. Skull: Normal. Negative for fracture or focal lesion. Sinuses/Orbits: No acute finding. Other: Small right frontal scalp hematoma is noted. CT CERVICAL SPINE FINDINGS Alignment: Minimal grade 1 anterolisthesis of C5-6 is noted secondary to posterior facet joint hypertrophy. Skull base and vertebrae: No acute fracture. No primary bone lesion or focal pathologic process. Soft tissues and spinal canal: No prevertebral fluid or swelling. No visible canal hematoma. Disc levels: Severe degenerative disc disease is noted extending from C3-4 to C6-7. Upper chest: Negative. Other: None. IMPRESSION: Small focus of subarachnoid hemorrhage seen in left frontal lobe. Critical Value/emergent results were called by telephone at the time of interpretation on 11/04/2017 at 1:06 pm to Dr. Julianne Rice , who verbally acknowledged these results. Severe multilevel degenerative disc disease. No  acute abnormality seen in the cervical spine. Electronically Signed   By: Sabino Dick  Brooke Bonito, M.D.   On: 11/04/2017 13:06   Dg Hip Unilat W Or Wo Pelvis 2-3 Views Right  Result Date: 11/04/2017 CLINICAL DATA:  Fall.  Right hip pain. EXAM: DG HIP (WITH OR WITHOUT PELVIS) 2-3V RIGHT COMPARISON:  04/16/2016 pelvic and right hip radiograph FINDINGS: There is slight cortical irregularity in the superior margin of right femoral neck on the pelvic view, suspicious for nondisplaced fracture. No additional potential fracture. No pelvic diastasis. No right hip dislocation. 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 Prominent degenerative changes in the visualized lower lumbar spine. No suspicious focal osseous lesions. IMPRESSION: Slight cortical irregularity in the superior right femoral neck on the pelvic view, suspicious for nondisplaced fracture. Recommend right hip CT for further evaluation. Electronically Signed   By: Ilona Sorrel M.D.   On: 11/04/2017 12:51    ROS Blood pressure (!) 109/55, pulse 96, temperature 97.6 F (36.4 C), temperature source Oral, resp. rate 20, SpO2 100 %. Estimated body mass index is 32.28 kg/m as calculated from the following:   Height as of 09/17/17: '5\' 5"'$  (1.651 m).   Weight as of 10/16/17: 88 kg.  Physical Exam  General: An alert and pleasant 77 year old white female with a right frontal scalp contusion and left frontal abrasion  HEENT: As above.  Her pupils are equal round react to light, extraocular muscles are intact.  There is no signs, CSF otorrhea or rhinorrhea.  Her tympanic membranes are clear bilaterally.  Neck: Supple without masses or deformities.  She has mild tenderness with range of motion.  Wax: Symmetric  Abdomen: Soft  Extremities: Unremarkable  Neurologic exam the patient is alert and oriented x3.  Glasgow Coma Scale 15.  Cranial nerves II through XII were examined bilaterally grossly normal.  Vision and hearing are grossly normal bilaterally.  Her  motor strength is 5/5 in her bilateral bicep, tricep, handgrip, quadriceps, gastrocnemius, and dorsiflexors.  Cerebellar function is intact to rapid alternating movements of the upper extremity bilaterally.  Sensory function is intact to light touch sensation all tested dermatomes bilaterally.  Imaging studies: I reviewed the patient's head CT performed at [facility] today.  She has a small right acute subdural hematoma without significant mass-effect.  She has a small left subarachnoid hemorrhage.  Have also reviewed the patient's head CT performed at [facility] today.  It demonstrates diffuse degenerative changes.  Assessment/Plan: Right subdural hematoma, traumatic subarachnoid hemorrhage: I discussed the situation with the patient.  She is going to be admitted by a medical service for work-up of her syncopal event.  I think she is okay to be monitored on 4 N. progressive.  She will need a repeat head scan tomorrow.  I doubt she will need surgery.  I have answered all her questions.  Syncopal event: This is going to be worked up by the medical service.  Ophelia Charter 11/04/2017, 2:09 PM

## 2017-11-04 NOTE — ED Notes (Signed)
Family in room pt to xray

## 2017-11-04 NOTE — ED Notes (Signed)
Dr Arnoldo Morale here to see pt and speak with family, pt understands she will have repeat ct in am to make sure no other bleeding

## 2017-11-04 NOTE — ED Notes (Signed)
Dr Lita Mains aware of of brain bleed

## 2017-11-04 NOTE — Telephone Encounter (Signed)
Patient called and says she passed out, fell and hit her head, has a knot on her head. She says she can move, but can't get up, thinks she hurt her knee, she's on the floor. She says her daughter is on the way. I advised I will call 911 and connect them to her, she verbalized understanding. 911 called and connected the call to the patient.

## 2017-11-04 NOTE — ED Notes (Signed)
Pt made aware of bleed family in room

## 2017-11-04 NOTE — ED Triage Notes (Addendum)
Fell last night and hit her head  ( denies LOC )and then fell agagin this am, doesn't remember this  Fall, and  Hit head agagin has hematoma rt side of head and having rt knee and rt hip pain since fall ,NO blood thinners ,is on home o2 at 2 lnc for COPD and CHF, pt lives by herself denies neck and back pain, ,  Family thinks pt is slightly confused, crawled from floor to sofa after falling this am

## 2017-11-04 NOTE — H&P (Signed)
History and Physical    [name]:[id number] DOB: 06-Apr-1940 DOA: 11/04/2017  PCP: Margaret Lukes, MD Consultants:  [facility] - nephrology; [facility] - cardiology; Maylon Peppers - oncology; [facility] Soho - pulmonology Patient coming from:  Home - lives alone; NOK: Daughter, [phone]  Chief Complaint: Syncope  HPI: [name] is a 77 y.o. female with medical history significant of  PMR; HTN; HLD; diastolic CHF;  COPD on 2L home O2; and stage 3 CKD presenting with syncope.  She was sitting at the table this AM and next thing she knew she was in the floor.  She is uncertain if she passed out.  She hit her right forehead this AM.  She felt fine while at the table.  She also fell last night - she stumped her toe and had a mechanical fall.  She hit a car with the left forehead then.  She did not lose consciousness last night and slept ok.  Now, she has a significant headache.  She is also requesting something to drink.  No nausea or vomiting.   ED Course:  [place] last night, no LOC, hit the side of her head.  She was doing ok, slept ok.  Felt lightheaded this AM and had a brief syncopal episode.  Contusion to left eyebrow and right forehead with normal neuro exam.  Subarachnoid and subdural bleed.  Neurosurgery concern was syncope this AM.  Review of Systems: As per HPI; otherwise review of systems reviewed and negative.   Ambulatory Status:  Ambulates without assistance  Past Medical History:  Diagnosis Date  . Abnormal glucose tolerance test   . Abnormal TSH 09/22/2016  . ACE-inhibitor cough   . Anemia 03/12/2014  . Arthritis   . Asthma    PFT 02/06/09 FEV1 1.41 (65%), FVC 1.92 (64), FEV1% 74, TLC 3.47 (71%), DLCO 48%, +BD  . Atypical chest pain    s/p cath, Normal coronaries, Non ST elevation myocardial infarction, Rt groin pseudoaneurysm  . Bacterial vaginosis 03/12/2014  . Cellulitis 06/22/2016  . Chronic kidney disease (CKD), stage III (moderate) ([place]) 08/04/2016  . COPD (chronic obstructive  pulmonary disease) ([place])   . Depression   . Diastolic heart failure ([place]) 04/07/2016  . DVT (deep venous thrombosis) ([place]) 1987  . GERD (gastroesophageal reflux disease)   . Gout   . Hypercalcemia 10/15/2014  . Hyperlipidemia   . Hypertension   . Hypoxia 10/15/2014  . Macular degeneration 04/10/2015  . Osteopenia 12/29/2006   Qualifier: Diagnosis of  By: Wynona Luna   . Pneumonia   . 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 Renal insufficiency 09/22/2016  . Unspecified constipation 06/05/2013  . Vitamin D deficiency 01/01/2015    Past Surgical History:  Procedure Laterality Date  . APPENDECTOMY  1951  . TONSILLECTOMY  1950  . TUBAL LIGATION  1968    Social History   Socioeconomic History  . Marital status: Widowed    Spouse name: Not on file  . Number of children: 1  . Years of education: Not on file  . Highest education level: Not on file  Occupational History  . 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 Financial resource strain: Not on file  . Food insecurity:    Worry: Not on file    Inability: Not on file  . Transportation needs:    Medical: Not on file    Non-medical: Not on file  Tobacco Use  . Smoking status: Never Smoker  .  Smokeless tobacco: Never Used  Substance and Sexual Activity  . Alcohol use: No  . Drug use: No  . Sexual activity: Never    Comment: lives alone, no dietary restrictions  Lifestyle  . Physical activity:    Days per week: Not on file    Minutes per session: Not on file  . Stress: Not on file  Relationships  . Social connections:    Talks on phone: Not on file    Gets together: Not on file    Attends religious service: Not on file    Active member of club or organization: Not on file    Attends meetings of clubs or organizations: Not on file    Relationship status: Not on file  . Intimate partner violence:    Fear of current or ex partner: Not on file    Emotionally abused: Not on file    Physically  abused: Not on file    Forced sexual activity: Not on file  Other Topics Concern  . Not on file  Social History Narrative  . Not on file    Allergies  Allergen Reactions  . Oseltamivir Phosphate     TAMIFLU REACTION: nausea, vomiting, diarrhea, dizziness  . Sulfa Antibiotics Other (See Comments)    CP  . Ace Inhibitors   . Indomethacin   . Montelukast Sodium     REACTION: Heart palpitations, chest pain  . Sulfonamide Derivatives     Family History  Problem Relation Age of Onset  . Asthma Sister   . Arthritis Sister   . Hypertension Sister   . Hyperlipidemia Sister   . Uterine cancer Sister   . Coronary artery disease Brother        x2  . Arthritis Brother   . Lung cancer Brother        smoker  . Hypertension Sister   . Arthritis Sister   . Hyperlipidemia Sister   . Emphysema Sister   . COPD Sister        smoker  . Heart disease Sister   . Hyperlipidemia Sister   . Hypertension Sister   . Arthritis Sister   . Emphysema Brother   . Heart disease Brother         smoker  . Heart attack Brother   . Mental illness Father   . Alcohol abuse Father   . Suicidality Father   . Heart disease Mother   . Hyperlipidemia Mother   . Heart attack Mother   . Epilepsy Daughter   . Hypertension Daughter   . Obesity Daughter   . COPD Brother        smoker  . Lung cancer Brother   . Coronary artery disease Other   . Colon polyps Sister     Prior to Admission medications   Medication Sig Start Date End Date Taking? Authorizing Provider  acetaminophen (TYLENOL) 500 MG tablet Take 500 mg by mouth 2 (two) times daily.    Yes [provider]  albuterol (ACCUNEB) 0.63 MG/3ML nebulizer solution Take 3 mLs (0.63 mg total) by nebulization every 6 (six) hours as needed for wheezing or shortness of breath. 01/16/17  Yes Margaret Lukes, MD  albuterol (PROVENTIL HFA;VENTOLIN HFA) 108 (90 Base) MCG/ACT inhaler INHALE 2 PUFFS INTO THE LUNGS 4 (FOUR) TIMES DAILY AS NEEDED FOR  WHEEZING OR SHORTNESS OF BREATH. 05/26/17  Yes Margaret Lukes, MD  allopurinol (ZYLOPRIM) 100 MG tablet TAKE 2 TABLETS ONE TIME DAILY Patient taking differently: Take  200 mg by mouth daily.  10/29/17  Yes Margaret Lukes, MD  aspirin EC 81 MG tablet Take 1 tablet (81 mg total) by mouth daily. 08/03/15  Yes Margaret Lukes, MD  b complex vitamins tablet Take 1 tablet by mouth daily.   Yes [provider]  budesonide-formoterol (SYMBICORT) 160-4.5 MCG/ACT inhaler INHALE 2 PUFFS INTO THE LUNGS 2 TIMES DAILY Patient taking differently: Inhale 2 puffs into the lungs 2 (two) times daily.  05/26/17  Yes Margaret Lukes, MD  cholecalciferol (VITAMIN D) 1000 units tablet Take 1,000 Units daily by mouth.   Yes [provider]  Ferrous Fumarate-Folic Acid (HEMOCYTE-F) 324-1 MG TABS Take 1 tablet by mouth daily. 07/24/16  Yes Margaret Lukes, MD  fluticasone (FLONASE) 50 MCG/ACT nasal spray Place 1 spray into both nostrils daily. Patient taking differently: Place 1 spray into both nostrils daily as needed for allergies.  03/06/16  Yes Magdalen Spatz, NP  furosemide (LASIX) 40 MG tablet TAKE 1 TABLET BY MOUTH DAILY AND A 2ND TABLET DAILY AS NEEDED FOR INCREASED EDEMA OR WEIGHT GAIN >3 LBS/24 HOUR 10/08/17  Yes Margaret Lukes, MD  loratadine (CLARITIN) 10 MG tablet Take 1 tablet (10 mg total) by mouth at bedtime. Patient taking differently: Take 10 mg by mouth at bedtime as needed for allergies.  03/06/16  Yes Magdalen Spatz, NP  metoprolol succinate (TOPROL-XL) 50 MG 24 hr tablet TAKE 1 AND 1/2 TABLETS EVERY DAY Patient taking differently: Take 75 mg by mouth daily.  04/13/17  Yes Margaret Lukes, MD  montelukast (SINGULAIR) 10 MG tablet Take 1 tablet (10 mg total) by mouth at bedtime as needed. 10/08/15  Yes Margaret Lukes, MD  OXYGEN Inhale 2 L into the lungs continuous.    Yes [provider]  potassium chloride SA (K-DUR,KLOR-CON) 20 MEQ tablet Take 1 tablet (20 mEq total) by mouth daily.  And a 2nd tab po daily prn extra Furosemide tab Patient taking differently: Take 20 mEq by mouth See admin instructions. And a 2nd tab po daily prn extra Furosemide tab 07/24/16  Yes Margaret Lukes, MD  pravastatin (PRAVACHOL) 10 MG tablet TAKE 1 TABLET BY MOUTH ONCE DAILY Patient taking differently: Take 10 mg by mouth daily.  08/28/17  Yes Margaret Lukes, MD  Probiotic Product (PROBIOTIC PO) Take 1 tablet by mouth daily.    Yes [provider]  ranitidine (ZANTAC) 150 MG tablet TAKE 1 TABLET TWICE DAILY Patient taking differently: Take 150 mg by mouth 2 (two) times daily.  04/13/17  Yes Margaret Lukes, MD  sertraline (ZOLOFT) 100 MG tablet Take 1 tablet (100 mg total) by mouth daily. 10/12/17  Yes Margaret Lukes, MD  traMADol (ULTRAM) 50 MG tablet TAKE 1 TABLET BY MOUTH THREE TIMES DAILY AS NEEDED Patient taking differently: Take 50 mg by mouth 2 (two) times daily as needed for moderate pain.  10/29/17  Yes Margaret Lukes, MD  trolamine salicylate (ASPERCREME) 10 % cream Apply 1 application topically as needed for muscle pain.   Yes [provider]  vitamin B-12 (CYANOCOBALAMIN) 1000 MCG tablet Take 1,000 mcg by mouth daily.    Yes [provider]  glucose blood (ONE TOUCH TEST STRIPS) test strip Use as instructed to check blood sugar once weekly. DX 790.29     [provider]  SPIRIVA HANDIHALER 18 MCG inhalation capsule PLACE 1 CAPSULE (18 MCG TOTAL) INTO INHALER AND INHALE CONTENTS ONE TIME DAILY. Patient  not taking: Reported on 11/04/2017 02/14/16   Margaret Lukes, MD    Physical Exam: Vitals:   11/04/17 1400 11/04/17 1500 11/04/17 1515 11/04/17 1530  BP: (!) 119/52 116/64  124/78  Pulse:  93 95 91  Resp: (!) 22 (!) 28 20 (!) 22  Temp:      TempSrc:      SpO2:  100% 100% 100%     General:  Appears calm and comfortable and is NAD; she has a large hematoma on her right forehead and a smaller area of excoriation and ecchymosis on the left  forehead     Eyes:  PERRL, EOMI, normal lids, iris; tattooed eyebrows noted ENT:  grossly normal hearing, lips & tongue, mmm; artificial upper dentition Neck:  no LAD, masses or thyromegaly; no carotid bruits Cardiovascular:  RRR, no m/r/g. No LE edema.  Respiratory:   CTA bilaterally with no wheezes/rales/rhonchi.  Normal respiratory effort. Abdomen:  soft, NT, ND, NABS Back:   normal alignment, no CVAT Skin:  no rash or induration seen on limited exam Musculoskeletal:  grossly normal tone BUE/BLE, good ROM, no bony abnormality Lower extremity:  No LE edema.  Limited foot exam with no ulcerations.  2+ distal pulses. Psychiatric:  grossly normal mood and affect, speech fluent and appropriate, AOx3 Neurologic:  CN 2-12 grossly intact, moves all extremities in coordinated fashion, sensation intact    Radiological Exams on Admission: Dg Chest 2 View  Result Date: 11/04/2017 CLINICAL DATA:  Cough.  Shortness of breath.  Fall. EXAM: CHEST - 2 VIEW COMPARISON:  04/16/2016 FINDINGS: There is slight peribronchial thickening which is chronic or recurrent. Overall heart size and pulmonary vascularity are normal. Chronic slight elevation of the left hemidiaphragm. No acute bone abnormalities. IMPRESSION: Slight bronchitic changes which could be recurrent or chronic. Electronically Signed   By: Lorriane Shire M.D.   On: 11/04/2017 12:49   Ct Head Wo Contrast  Result Date: 11/04/2017 CLINICAL DATA:  Posttraumatic headache and neck pain after fall. EXAM: CT HEAD WITHOUT CONTRAST CT CERVICAL SPINE WITHOUT CONTRAST TECHNIQUE: Multidetector CT imaging of the head and cervical spine was performed following the standard protocol without intravenous contrast. Multiplanar CT image reconstructions of the cervical spine were also generated. COMPARISON:  CT scan of October 22, 2016. FINDINGS: CT HEAD FINDINGS Brain: Small amount of subarachnoid hemorrhage is noted in the left frontal lobe. No mass effect or  midline shift is noted. Ventricular size is within normal limits. No acute infarction or mass lesion is noted. Vascular: No hyperdense vessel or unexpected calcification. Skull: Normal. Negative for fracture or focal lesion. Sinuses/Orbits: No acute finding. Other: Small right frontal scalp hematoma is noted. CT CERVICAL SPINE FINDINGS Alignment: Minimal grade 1 anterolisthesis of C5-6 is noted secondary to posterior facet joint hypertrophy. Skull base and vertebrae: No acute fracture. No primary bone lesion or focal pathologic process. Soft tissues and spinal canal: No prevertebral fluid or swelling. No visible canal hematoma. Disc levels: Severe degenerative disc disease is noted extending from C3-4 to C6-7. Upper chest: Negative. Other: None. IMPRESSION: Small focus of subarachnoid hemorrhage seen in left frontal lobe. Critical Value/emergent results were called by telephone at the time of interpretation on 11/04/2017 at 1:06 pm to Dr. Julianne Rice , who verbally acknowledged these results. Severe multilevel degenerative disc disease. No acute abnormality seen in the cervical spine. Electronically Signed   By: Marijo Conception, M.D.   On: 11/04/2017 13:06   Ct Cervical Spine Wo Contrast  Result  Date: 11/04/2017 CLINICAL DATA:  Posttraumatic headache and neck pain after fall. EXAM: CT HEAD WITHOUT CONTRAST CT CERVICAL SPINE WITHOUT CONTRAST TECHNIQUE: Multidetector CT imaging of the head and cervical spine was performed following the standard protocol without intravenous contrast. Multiplanar CT image reconstructions of the cervical spine were also generated. COMPARISON:  CT scan of October 22, 2016. FINDINGS: CT HEAD FINDINGS Brain: Small amount of subarachnoid hemorrhage is noted in the left frontal lobe. No mass effect or midline shift is noted. Ventricular size is within normal limits. No acute infarction or mass lesion is noted. Vascular: No hyperdense vessel or unexpected calcification. Skull: Normal.  Negative for fracture or focal lesion. Sinuses/Orbits: No acute finding. Other: Small right frontal scalp hematoma is noted. CT CERVICAL SPINE FINDINGS Alignment: Minimal grade 1 anterolisthesis of C5-6 is noted secondary to posterior facet joint hypertrophy. Skull base and vertebrae: No acute fracture. No primary bone lesion or focal pathologic process. Soft tissues and spinal canal: No prevertebral fluid or swelling. No visible canal hematoma. Disc levels: Severe degenerative disc disease is noted extending from C3-4 to C6-7. Upper chest: Negative. Other: None. IMPRESSION: Small focus of subarachnoid hemorrhage seen in left frontal lobe. Critical Value/emergent results were called by telephone at the time of interpretation on 11/04/2017 at 1:06 pm to Dr. Julianne Rice , who verbally acknowledged these results. Severe multilevel degenerative disc disease. No acute abnormality seen in the cervical spine. Electronically Signed   By: Marijo Conception, M.D.   On: 11/04/2017 13:06   Dg Hip Unilat W Or Wo Pelvis 2-3 Views Right  Result Date: 11/04/2017 CLINICAL DATA:  Fall.  Right hip pain. EXAM: DG HIP (WITH OR WITHOUT PELVIS) 2-3V RIGHT COMPARISON:  04/16/2016 pelvic and right hip radiograph FINDINGS: There is slight cortical irregularity in the superior margin of right femoral neck on the pelvic view, suspicious for nondisplaced fracture. No additional potential fracture. No pelvic diastasis. No right hip dislocation. 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 Prominent degenerative changes in the visualized lower lumbar spine. No suspicious focal osseous lesions. IMPRESSION: Slight cortical irregularity in the superior right femoral neck on the pelvic view, suspicious for nondisplaced fracture. Recommend right hip CT for further evaluation. Electronically Signed   By: Ilona Sorrel M.D.   On: 11/04/2017 12:51    EKG: Independently reviewed.  NSR with rate 95; nonspecific ST changes with no evidence of acute  ischemia   Labs on Admission: I have personally reviewed the available labs and imaging studies at the time of the admission.  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 Arnoldo Morale evaluated the patient and also reports a right subdural hematoma -Will observe the patient in SDU overnight with ongoing neurosurgical consultation -Repeat head CT in AM -Hold ASA overnight -Tylenol, morphine as needed for pain -Zofran prn  n/v  Syncope -Patient's syncopal episode today is most likely related to her small subarachnoid hemorrhage from yesterday -However, with her known h/o CHF, it is reasonable to obtain a repeat Echo -Will monitor on telemetry overnight -Orthostatic vital signs in AM -Neuro checks q4h  Chronic combined CHF -Echo in 4/18 with chronic combined CHF, EF 50-55% and grade 1 DD -Appears to be compensated -Repeat echo, as above  DM -Diet controlled with last A1c 5.6 in 5/19 -Will not order SSI at this time  HTN -Continue Toprol XL  Stage 4 CKD with anemia -Appears to be stable at this time -Will follow with repeat labs in AM -Plan for epo is Hgb <10 persistently  Thrombocytopenia -Longstanding -Followed by hematology without plan for further evaluation as of  9/27    DVT prophylaxis: SCDs Code Status:  DNR - confirmed with patient/family Family Communication: Daughter present throughout evaluation  Disposition Plan:  Home once clinically improved Consults called: Neurosurgery  Admission status: It is my clinical opinion that referral for OBSERVATION is reasonable and necessary in this patient based on the above information provided. The aforementioned taken together are felt to place the patient at high risk for further clinical deterioration. However it is anticipated that the patient may be medically stable for discharge from the hospital within 24 to 48 hours.    Karmen Bongo MD [facility]  If note is complete, please contact covering daytime or nighttime physician. [email or web address] Password [email or web address]  11/04/2017, 5:31 PM

## 2017-11-04 NOTE — ED Provider Notes (Signed)
[facility] EMERGENCY DEPARTMENT Provider Note   CSN: [id number] Arrival date & time: 11/04/17  1105     History   Chief Complaint Chief Complaint  Patient presents with  . Fall  . Head Injury    HPI [name] is a 77 y.o. female.  HPI Patient states she tripped and fell yesterday evening striking her head.  Did not lose consciousness.  Was able to get up after the fall.  She has a history of COPD and is on 2 L of supplemental oxygen continuously.  She then had what she believed to be a syncopal episode this morning.  States that she got her up around 10 AM and had lightheadedness.  States she does remember falling to the floor.  Had difficulty getting up from the floor but crawled to the phone to call her daughter.  Was able to transfer onto stretcher EMS arrived.  She is complaining of right-sided hip pain.  States she recently has gotten over a cold but denies any fever or chills.  Family believes the patient has mildly confused compared to her baseline.  Is not on any type of blood thinner. Past Medical History:  Diagnosis Date  . Abnormal glucose tolerance test   . Abnormal TSH 09/22/2016  . ACE-inhibitor cough   . Anemia 03/12/2014  . Arthritis   . Asthma    PFT 02/06/09 FEV1 1.41 (65%), FVC 1.92 (64), FEV1% 74, TLC 3.47 (71%), DLCO 48%, +BD  . Atypical chest pain    s/p cath, Normal coronaries, Non ST elevation myocardial infarction, Rt groin pseudoaneurysm  . Bacterial vaginosis 03/12/2014  . Cellulitis 06/22/2016  . Chronic kidney disease (CKD), stage III (moderate) ([place]) 08/04/2016  . COPD (chronic obstructive pulmonary disease) ([place])   . Depression   . Diastolic heart failure ([place]) 04/07/2016  . DVT (deep venous thrombosis) ([place]) 1987  . GERD (gastroesophageal reflux disease)   . Gout   . Hypercalcemia 10/15/2014  . Hyperlipidemia   . Hypertension   . Hypoxia 10/15/2014  . Macular degeneration 04/10/2015  . Osteopenia 12/29/2006   Qualifier:  Diagnosis of  By: Wynona Luna   . Pneumonia   . 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 Renal insufficiency 09/22/2016  . Unspecified constipation 06/05/2013  . Vitamin D deficiency 01/01/2015    Patient Active Problem List   Diagnosis Date Noted  . Subarachnoid hemorrhage ([place]) 11/05/2017  . Intracranial hemorrhage ([place]) 11/04/2017  . Syncope and collapse 11/04/2017  . Thrombocytopenia ([place]) 10/16/2017  . 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 Vitamin D deficiency 06/16/2017  . Hypernatremia 12/25/2016  . Nonintractable headache 10/26/2016  . Leukocytes in urine 10/26/2016  . Peripheral arterial disease ([place]) 10/01/2016  . Renal insufficiency 09/22/2016  . Abnormal TSH 09/22/2016  . Chronic kidney disease (CKD), stage III (moderate) ([place]) 08/04/2016  . Heart murmur 04/25/2016  . Chronic combined systolic and diastolic CHF (congestive heart failure) ([place]) 04/07/2016  . Chronic bronchitis ([place]) 03/06/2016  . 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 Pedal edema 08/28/2015  . Macular degeneration 04/10/2015  . Hypercalcemia 10/15/2014  . Hypoxia 10/15/2014  . 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 Cervical cancer screening 02/28/2014  . Eustachian tube dysfunction 01/29/2014  . Medicare annual wellness visit, subsequent 12/04/2013  . Constipation 06/05/2013  . Tachycardia 02/23/2013  . Sun-damaged skin 10/24/2012  . Lower urinary tract infectious disease 10/24/2012  . Back pain 04/07/2011  . Allergic rhinitis 05/23/2009  .  Gout 01/11/2008  . VARICOSE VEINS, LOWER EXTREMITIES 06/01/2007  . ADJ DISORDER WITH MIXED ANXIETY & DEPRESSED MOOD 03/25/2007  . Hyperlipidemia, mixed 12/29/2006  . Essential hypertension 12/29/2006  . Osteopenia 12/29/2006  . Abdominal pain 12/29/2006  . Diabetes type 2, controlled ([place]) 12/29/2006  . Asthma, moderate persistent 08/21/2006  . GERD 08/21/2006  . OSTEOARTHRITIS 08/21/2006  .  Phlebitis and thrombophlebitis 08/21/2006    Past Surgical History:  Procedure Laterality Date  . APPENDECTOMY  1951  . TONSILLECTOMY  1950  . TUBAL LIGATION  1968     OB History   None      Home Medications    Prior to Admission medications   Medication Sig Start Date End Date Taking? Authorizing Provider  acetaminophen (TYLENOL) 500 MG tablet Take 500 mg by mouth 2 (two) times daily.    Yes [provider]  albuterol (ACCUNEB) 0.63 MG/3ML nebulizer solution Take 3 mLs (0.63 mg total) by nebulization every 6 (six) hours as needed for wheezing or shortness of breath. 01/16/17  Yes Mosie Lukes, MD  albuterol (PROVENTIL HFA;VENTOLIN HFA) 108 (90 Base) MCG/ACT inhaler INHALE 2 PUFFS INTO THE LUNGS 4 (FOUR) TIMES DAILY AS NEEDED FOR WHEEZING OR SHORTNESS OF BREATH. 05/26/17  Yes Mosie Lukes, MD  allopurinol (ZYLOPRIM) 100 MG tablet TAKE 2 TABLETS ONE TIME DAILY Patient taking differently: Take 200 mg by mouth daily.  10/29/17  Yes Mosie Lukes, MD  b complex vitamins tablet Take 1 tablet by mouth daily.   Yes [provider]  budesonide-formoterol (SYMBICORT) 160-4.5 MCG/ACT inhaler INHALE 2 PUFFS INTO THE LUNGS 2 TIMES DAILY Patient taking differently: Inhale 2 puffs into the lungs 2 (two) times daily.  05/26/17  Yes Mosie Lukes, MD  cholecalciferol (VITAMIN D) 1000 units tablet Take 1,000 Units daily by mouth.   Yes [provider]  Ferrous Fumarate-Folic Acid (HEMOCYTE-F) 324-1 MG TABS Take 1 tablet by mouth daily. 07/24/16  Yes Mosie Lukes, MD  fluticasone (FLONASE) 50 MCG/ACT nasal spray Place 1 spray into both nostrils daily. Patient taking differently: Place 1 spray into both nostrils daily as needed for allergies.  03/06/16  Yes Magdalen Spatz, NP  furosemide (LASIX) 40 MG tablet TAKE 1 TABLET BY MOUTH DAILY AND A 2ND TABLET DAILY AS NEEDED FOR INCREASED EDEMA OR WEIGHT GAIN >3 LBS/24 HOUR 10/08/17  Yes Mosie Lukes, MD  loratadine  (CLARITIN) 10 MG tablet Take 1 tablet (10 mg total) by mouth at bedtime. Patient taking differently: Take 10 mg by mouth at bedtime as needed for allergies.  03/06/16  Yes Magdalen Spatz, NP  metoprolol succinate (TOPROL-XL) 50 MG 24 hr tablet TAKE 1 AND 1/2 TABLETS EVERY DAY Patient taking differently: Take 75 mg by mouth daily.  04/13/17  Yes Mosie Lukes, MD  montelukast (SINGULAIR) 10 MG tablet Take 1 tablet (10 mg total) by mouth at bedtime as needed. 10/08/15  Yes Mosie Lukes, MD  OXYGEN Inhale 2 L into the lungs continuous.    Yes [provider]  potassium chloride SA (K-DUR,KLOR-CON) 20 MEQ tablet Take 1 tablet (20 mEq total) by mouth daily. And a 2nd tab po daily prn extra Furosemide tab Patient taking differently: Take 20 mEq by mouth See admin instructions. And a 2nd tab po daily prn extra Furosemide tab 07/24/16  Yes Mosie Lukes, MD  pravastatin (PRAVACHOL) 10 MG tablet TAKE 1 TABLET BY MOUTH ONCE DAILY Patient taking differently: Take 10 mg by  mouth daily.  08/28/17  Yes Mosie Lukes, MD  Probiotic Product (PROBIOTIC PO) Take 1 tablet by mouth daily.    Yes [provider]  ranitidine (ZANTAC) 150 MG tablet TAKE 1 TABLET TWICE DAILY Patient taking differently: Take 150 mg by mouth 2 (two) times daily.  04/13/17  Yes Mosie Lukes, MD  sertraline (ZOLOFT) 100 MG tablet Take 1 tablet (100 mg total) by mouth daily. 10/12/17  Yes Mosie Lukes, MD  traMADol (ULTRAM) 50 MG tablet TAKE 1 TABLET BY MOUTH THREE TIMES DAILY AS NEEDED Patient taking differently: Take 50 mg by mouth 2 (two) times daily as needed for moderate pain.  10/29/17  Yes Mosie Lukes, MD  trolamine salicylate (ASPERCREME) 10 % cream Apply 1 application topically as needed for muscle pain.   Yes [provider]  vitamin B-12 (CYANOCOBALAMIN) 1000 MCG tablet Take 1,000 mcg by mouth daily.    Yes [provider]  glucose blood (ONE TOUCH TEST STRIPS) test strip Use as  instructed to check blood sugar once weekly. DX 790.29     [provider]    Family History Family History  Problem Relation Age of Onset  . Asthma Sister   . Arthritis Sister   . Hypertension Sister   . Hyperlipidemia Sister   . Uterine cancer Sister   . Coronary artery disease Brother        x2  . Arthritis Brother   . Lung cancer Brother        smoker  . Hypertension Sister   . Arthritis Sister   . Hyperlipidemia Sister   . Emphysema Sister   . COPD Sister        smoker  . Heart disease Sister   . Hyperlipidemia Sister   . Hypertension Sister   . Arthritis Sister   . Emphysema Brother   . Heart disease Brother         smoker  . Heart attack Brother   . Mental illness Father   . Alcohol abuse Father   . Suicidality Father   . Heart disease Mother   . Hyperlipidemia Mother   . Heart attack Mother   . Epilepsy Daughter   . Hypertension Daughter   . Obesity Daughter   . COPD Brother        smoker  . Lung cancer Brother   . Coronary artery disease Other   . Colon polyps Sister     Social History Social History   Tobacco Use  . Smoking status: Never Smoker  . Smokeless tobacco: Never Used  Substance Use Topics  . Alcohol use: No  . Drug use: No     Allergies   Oseltamivir phosphate; Sulfa antibiotics; Ace inhibitors; Indomethacin; Montelukast sodium; and Sulfonamide derivatives   Review of Systems Review of Systems  Constitutional: Negative for chills and fever.  HENT: Negative for sinus pressure, sore throat and trouble swallowing.   Eyes: Negative for visual disturbance.  Respiratory: Positive for cough. Negative for shortness of breath.   Cardiovascular: Negative for chest pain.  Gastrointestinal: Negative for abdominal pain, diarrhea, nausea and vomiting.  Genitourinary: Negative for dysuria, flank pain and frequency.  Musculoskeletal: Negative for back pain, myalgias and neck pain.  Skin: Positive for wound. Negative for rash.    Neurological: Positive for syncope and light-headedness. Negative for weakness, numbness and headaches.  All other systems reviewed and are negative.    Physical Exam Updated Vital Signs BP (!) 121/52 (BP Location:  Right Arm)   Pulse 72   Temp 99 F (37.2 C) (Oral)   Resp 15   SpO2 99%   Physical Exam  Constitutional: She is oriented to person, place, and time. She appears well-developed and well-nourished. No distress.  HENT:  Head: Normocephalic.  Mouth/Throat: Oropharynx is clear and moist.  Patient with contusion and small abrasion over the lateral surface of the left eyebrow.  No scalp deformity.  Midface is stable.  No intraoral trauma.  Eyes: Pupils are equal, round, and reactive to light. EOM are normal.  Neck: Neck supple.  Minimal midline cervical tenderness to palpation without step-offs or deformity.  Cardiovascular: Normal rate and regular rhythm. Exam reveals no gallop and no friction rub.  No murmur heard. Pulmonary/Chest: Effort normal and breath sounds normal. No stridor. No respiratory distress. She has no wheezes. She has no rales. She exhibits no tenderness.  Abdominal: Soft. Bowel sounds are normal. There is no tenderness. There is no rebound and no guarding.  Musculoskeletal: Normal range of motion. She exhibits no edema or tenderness.  Pain with range of motion of the right hip.  Tenderness to palpation over the lateral surface of the right hip.  No lower extremity asymmetry or shortening.  Full range of motion of bilateral knees.  No calf swelling or tenderness.  Distal pulses are 2+.  No midline thoracic lumbar tenderness.  Neurological: She is alert and oriented to person, place, and time.  5/5 motor in all extremities.  Sensation to light touch is fully intact.  Skin: Skin is warm and dry. No rash noted. She is not diaphoretic. No erythema.  Psychiatric: She has a normal mood and affect. Her behavior is normal.  Nursing note and vitals reviewed.    ED  Treatments / Results  Labs (all labs ordered are listed, but only abnormal results are displayed) Labs Reviewed  CBC WITH DIFFERENTIAL/PLATELET - Abnormal; Notable for the following components:      Result Value   RBC 3.24 (*)    Hemoglobin 10.1 (*)    HCT 32.9 (*)    MCV 101.5 (*)    Platelets 119 (*)    All other components within normal limits  COMPREHENSIVE METABOLIC PANEL - Abnormal; Notable for the following components:   Potassium 5.2 (*)    BUN 29 (*)    Creatinine, Ser 1.62 (*)    GFR calc non Af Amer 30 (*)    GFR calc Af Amer 34 (*)    All other components within normal limits  BASIC METABOLIC PANEL - Abnormal; Notable for the following components:   Glucose, Bld 112 (*)    BUN 26 (*)    Creatinine, Ser 1.49 (*)    GFR calc non Af Amer 33 (*)    GFR calc Af Amer 38 (*)    All other components within normal limits  CBC - Abnormal; Notable for the following components:   RBC 2.86 (*)    Hemoglobin 8.8 (*)    HCT 29.0 (*)    MCV 101.4 (*)    Platelets 89 (*)    All other components within normal limits  BASIC METABOLIC PANEL - Abnormal; Notable for the following components:   Glucose, Bld 110 (*)    BUN 26 (*)    Creatinine, Ser 1.33 (*)    GFR calc non Af Amer 38 (*)    GFR calc Af Amer 44 (*)    All other components within normal limits  CBC -  Abnormal; Notable for the following components:   RBC 2.83 (*)    Hemoglobin 9.0 (*)    HCT 28.6 (*)    MCV 101.1 (*)    Platelets 92 (*)    All other components within normal limits  RENAL FUNCTION PANEL - Abnormal; Notable for the following components:   Glucose, Bld 105 (*)    BUN 26 (*)    Creatinine, Ser 1.45 (*)    Albumin 3.2 (*)    GFR calc non Af Amer 34 (*)    GFR calc Af Amer 39 (*)    All other components within normal limits    EKG EKG Interpretation  Date/Time:  Wednesday November 04 2017 11:15:38 EDT Ventricular Rate:  95 PR Interval:    QRS Duration: 89 QT Interval:  346 QTC  Calculation: 435 R Axis:   4 Text Interpretation:  Sinus rhythm Ventricular premature complex Prolonged PR interval Low voltage, precordial leads Baseline wander in lead(s) V3 Confirmed by Julianne Rice [phone]) on 11/04/2017 11:38:46 AM   Radiology No results found.  Procedures Procedures (including critical care time)  Medications Ordered in ED Medications - No data to display  CRITICAL CARE Performed by: Julianne Rice Total critical care time:25 minutes Critical care time was exclusive of separately billable procedures and treating other patients. Critical care was necessary to treat or prevent imminent or life-threatening deterioration. Critical care was time spent personally by me on the following activities: development of treatment plan with patient and/or surrogate as well as nursing, discussions with consultants, evaluation of patient's response to treatment, examination of patient, obtaining history from patient or surrogate, ordering and performing treatments and interventions, ordering and review of laboratory studies, ordering and review of radiographic studies, pulse oximetry and re-evaluation of patient's condition. Initial Impression / Assessment and Plan / ED Course  I have reviewed the triage vital signs and the nursing notes.  Pertinent labs & imaging results that were available during my care of the patient were reviewed by me and considered in my medical decision making (see chart for details).     All subarachnoid hemorrhage on CT scan.  Dr. Arnoldo Morale will evaluate in the emergency department. Spoke with hospitalist who will admit. Final Clinical Impressions(s) / ED Diagnoses   Final diagnoses:  SAH (subarachnoid hemorrhage) [facility])    ED Discharge Orders         Ordered    Increase activity slowly     11/07/17 1041    Diet - low sodium heart healthy     11/07/17 1041    Discharge instructions    Comments:  Please follow-up with your primary care doctor  in 1 week.  He will be contacted by our interventional radiologist in 2 to 4 weeks to set up an appointment for your aneurysm.  Please do not take your aspirin anymore.   11/07/17 1041    Call MD for:  temperature >100.4     11/07/17 1041    Call MD for:  persistant nausea and vomiting     11/07/17 1041    Call MD for:  severe uncontrolled pain     11/07/17 1041    Call MD for:  redness, tenderness, or signs of infection (pain, swelling, redness, odor or green/yellow discharge around incision site)     11/07/17 1041    Call MD for:  difficulty breathing, headache or visual disturbances     11/07/17 1041    Call MD for:  hives  11/07/17 1041    Call MD for:  persistant dizziness or light-headedness     11/07/17 1041    Call MD for:  extreme fatigue     11/07/17 1041           Julianne Rice, MD 11/10/17 1523

## 2017-11-05 ENCOUNTER — Observation Stay (HOSPITAL_COMMUNITY): Payer: Medicare HMO

## 2017-11-05 ENCOUNTER — Inpatient Hospital Stay (HOSPITAL_COMMUNITY): Payer: Medicare HMO

## 2017-11-05 DIAGNOSIS — S066X9A Traumatic subarachnoid hemorrhage with loss of consciousness of unspecified duration, initial encounter: Secondary | ICD-10-CM | POA: Diagnosis present

## 2017-11-05 DIAGNOSIS — I252 Old myocardial infarction: Secondary | ICD-10-CM | POA: Diagnosis not present

## 2017-11-05 DIAGNOSIS — S0003XA Contusion of scalp, initial encounter: Secondary | ICD-10-CM | POA: Diagnosis not present

## 2017-11-05 DIAGNOSIS — D696 Thrombocytopenia, unspecified: Secondary | ICD-10-CM | POA: Diagnosis present

## 2017-11-05 DIAGNOSIS — Z801 Family history of malignant neoplasm of trachea, bronchus and lung: Secondary | ICD-10-CM | POA: Diagnosis not present

## 2017-11-05 DIAGNOSIS — I130 Hypertensive heart and chronic kidney disease with heart failure and stage 1 through stage 4 chronic kidney disease, or unspecified chronic kidney disease: Secondary | ICD-10-CM | POA: Diagnosis present

## 2017-11-05 DIAGNOSIS — Z6832 Body mass index (BMI) 32.0-32.9, adult: Secondary | ICD-10-CM | POA: Diagnosis not present

## 2017-11-05 DIAGNOSIS — Z79899 Other long term (current) drug therapy: Secondary | ICD-10-CM | POA: Diagnosis not present

## 2017-11-05 DIAGNOSIS — Z66 Do not resuscitate: Secondary | ICD-10-CM | POA: Diagnosis present

## 2017-11-05 DIAGNOSIS — M353 Polymyalgia rheumatica: Secondary | ICD-10-CM | POA: Diagnosis present

## 2017-11-05 DIAGNOSIS — Z811 Family history of alcohol abuse and dependence: Secondary | ICD-10-CM | POA: Diagnosis not present

## 2017-11-05 DIAGNOSIS — E785 Hyperlipidemia, unspecified: Secondary | ICD-10-CM | POA: Diagnosis present

## 2017-11-05 DIAGNOSIS — N184 Chronic kidney disease, stage 4 (severe): Secondary | ICD-10-CM | POA: Diagnosis present

## 2017-11-05 DIAGNOSIS — S0012XA Contusion of left eyelid and periocular area, initial encounter: Secondary | ICD-10-CM | POA: Diagnosis present

## 2017-11-05 DIAGNOSIS — J449 Chronic obstructive pulmonary disease, unspecified: Secondary | ICD-10-CM | POA: Diagnosis present

## 2017-11-05 DIAGNOSIS — R55 Syncope and collapse: Secondary | ICD-10-CM | POA: Diagnosis present

## 2017-11-05 DIAGNOSIS — I5030 Unspecified diastolic (congestive) heart failure: Secondary | ICD-10-CM | POA: Diagnosis not present

## 2017-11-05 DIAGNOSIS — I629 Nontraumatic intracranial hemorrhage, unspecified: Secondary | ICD-10-CM | POA: Diagnosis not present

## 2017-11-05 DIAGNOSIS — I609 Nontraumatic subarachnoid hemorrhage, unspecified: Secondary | ICD-10-CM

## 2017-11-05 DIAGNOSIS — M109 Gout, unspecified: Secondary | ICD-10-CM | POA: Diagnosis present

## 2017-11-05 DIAGNOSIS — D631 Anemia in chronic kidney disease: Secondary | ICD-10-CM

## 2017-11-05 DIAGNOSIS — I5042 Chronic combined systolic (congestive) and diastolic (congestive) heart failure: Secondary | ICD-10-CM | POA: Diagnosis present

## 2017-11-05 DIAGNOSIS — K219 Gastro-esophageal reflux disease without esophagitis: Secondary | ICD-10-CM | POA: Diagnosis present

## 2017-11-05 DIAGNOSIS — Z8049 Family history of malignant neoplasm of other genital organs: Secondary | ICD-10-CM | POA: Diagnosis not present

## 2017-11-05 DIAGNOSIS — M8580 Other specified disorders of bone density and structure, unspecified site: Secondary | ICD-10-CM | POA: Diagnosis present

## 2017-11-05 DIAGNOSIS — E1122 Type 2 diabetes mellitus with diabetic chronic kidney disease: Secondary | ICD-10-CM | POA: Diagnosis present

## 2017-11-05 DIAGNOSIS — H3530 Unspecified macular degeneration: Secondary | ICD-10-CM | POA: Diagnosis present

## 2017-11-05 DIAGNOSIS — W010XXA Fall on same level from slipping, tripping and stumbling without subsequent striking against object, initial encounter: Secondary | ICD-10-CM | POA: Diagnosis present

## 2017-11-05 DIAGNOSIS — S065X9A Traumatic subdural hemorrhage with loss of consciousness of unspecified duration, initial encounter: Secondary | ICD-10-CM | POA: Diagnosis present

## 2017-11-05 DIAGNOSIS — E669 Obesity, unspecified: Secondary | ICD-10-CM | POA: Diagnosis present

## 2017-11-05 HISTORY — DX: Nontraumatic subarachnoid hemorrhage, unspecified: I60.9

## 2017-11-05 LAB — BASIC METABOLIC PANEL
Anion gap: 7 (ref 5–15)
BUN: 26 mg/dL — ABNORMAL HIGH (ref 8–23)
CO2: 29 mmol/L (ref 22–32)
Calcium: 9 mg/dL (ref 8.9–10.3)
Chloride: 104 mmol/L (ref 98–111)
Creatinine, Ser: 1.49 mg/dL — ABNORMAL HIGH (ref 0.44–1.00)
GFR calc non Af Amer: 33 mL/min — ABNORMAL LOW (ref >60)
GFR, EST AFRICAN AMERICAN: 38 mL/min — AB (ref >60)
Glucose, Bld: 112 mg/dL — ABNORMAL HIGH (ref 70–99)
Potassium: 3.9 mmol/L (ref 3.5–5.1)
Sodium: 140 mmol/L (ref 135–145)

## 2017-11-05 LAB — CBC
HEMATOCRIT: 29 % — AB (ref 36.0–46.0)
HEMOGLOBIN: 8.8 g/dL — AB (ref 12.0–15.0)
MCH: 30.8 pg (ref 26.0–34.0)
MCHC: 30.3 g/dL (ref 30.0–36.0)
MCV: 101.4 fL — AB (ref 80.0–100.0)
NRBC: 0 % (ref 0.0–0.2)
Platelets: 89 10*3/uL — ABNORMAL LOW (ref 150–400)
RBC: 2.86 MIL/uL — AB (ref 3.87–5.11)
RDW: 14.6 % (ref 11.5–15.5)
WBC: 5.1 10*3/uL (ref 4.0–10.5)

## 2017-11-05 LAB — ECHOCARDIOGRAM COMPLETE

## 2017-11-05 NOTE — Progress Notes (Signed)
  Echocardiogram 2D Echocardiogram has been performed.  [name] M 11/05/2017, 9:31 AM

## 2017-11-05 NOTE — Progress Notes (Signed)
Received order from [facility] provider to notify neurosurgery of change in pt neuro condition. [name] paged and notified on call messaging service, awaiting provider call back. [name]

## 2017-11-05 NOTE — Progress Notes (Addendum)
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 female with medical history significant of  PMR; HTN; HLD; diastolic CHF;  COPD on 2L home O2; and stage 3 CKD presenting with syncope.  She was sitting at the table this AM and next thing she knew she was in the floor.  She is uncertain if she passed out.Felt lightheaded this AM and had a brief syncopal episode. Contusion to left eyebrow and right forehead with normal neuro exam.  CT head showed  Subdural hematoma, subarachnoid hemorrhage.   Assessment and plan Intracranial hemorrhage -Patient with a mechanical fall yesterday, striking her left forehead on a car - followed by a syncopal episode -Imaging shows a small subarachnoid hemorrhage in the left frontal lobe -Dr. 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 Arnoldo Morale -Tylenol, morphine as needed for pain -Zofran prn  n/v  Syncope -Patient's syncopal episode today is most likely related to her small subarachnoid hemorrhage from yesterday , will eat MRI/MRA of the brain -However, with her known h/o CHF,  Echo pending - telemetry shows normal sinus rhythm -Orthostatic vital signs in AM -Neuro checks q4h  Chronic combined CHF -Echo in 4/18 with chronic combined CHF, EF 50-55% and grade 1 DD -Appears to be compensated -Repeat echo, as above  DM -Diet controlled with last A1c 5.6 in 5/19 -Will not order SSI at this time  HTN -Continue Toprol XL  Stage 4 CKD with anemia - baseline creatinine  1.76, currently 1.49 -Will follow with repeat labs in AM -Plan for epo is Hgb <10 persistently  Thrombocytopenia -Longstanding , however platelet count is trending down, will monitor closely -Followed by hematology without plan for further evaluation as of 9/27   DVT prophylaxsis  SCD  Code Status:  DO NOT RESUSCITATE   Family Communication: Discussed in detail with the patient, all imaging results, lab results explained to the patient   Disposition Plan:   Anticipate discharge tomorrow once workup is completed     Consultants:  Neurosurgery  IR  Procedures:  none  Antibiotics: Anti-infectives (From admission, onward)   None         HPI/Subjective: Patient still complains of a HA   Objective: Vitals:   11/04/17 2349 11/05/17 0353 11/05/17 0853 11/05/17 1000  BP: (!) 119/42 (!) 103/40  (!) 124/54  Pulse: 94 87  79  Resp: 18 18  16   Temp: 98.9 F (37.2 C) 98.7 F (37.1 C)  98.5 F (  36.9 C)  TempSrc: Oral Oral  Oral  SpO2: 98% 98% 94%     Intake/Output Summary (Last 24 hours) at 11/05/2017 1233 Last data filed at 11/05/2017 0330 Gross per 24 hour  Intake -  Output 500 ml  Net -500 ml    Exam:  Examination:  General exam:  Awake, periorbital bruising Respiratory system: Clear to auscultation. Respiratory effort normal. Cardiovascular system: S1 & S2 heard,  RRR. No JVD, murmurs, rubs, gallops or clicks. No pedal edema. Gastrointestinal system: Abdomen is nondistended, soft and nontender. No organomegaly or masses felt. Normal bowel sounds heard. Central nervous system: Alert and oriented. No focal neurological deficits. Extremities: Symmetric 5 x 5 power. Skin: No rashes, lesions or ulcers Psychiatry: Judgement and insight appear normal. Mood & affect appropriate.     Data Reviewed: I have personally reviewed following labs and imaging studies  Micro Results No results found for this or any previous visit (from the past 240 hour(s)).  Radiology Reports Dg Chest 2 View  Result Date: 11/04/2017 CLINICAL DATA:  Cough.  Shortness of breath.  Fall. EXAM: CHEST - 2 VIEW COMPARISON:  04/16/2016 FINDINGS: There is slight peribronchial thickening which is chronic or recurrent. Overall heart size and pulmonary vascularity are normal. Chronic slight elevation of the left hemidiaphragm. No acute bone abnormalities. IMPRESSION: Slight bronchitic changes which could be recurrent or chronic. Electronically Signed   By: Lorriane Shire M.D.   On: 11/04/2017 12:49   Ct Head Wo Contrast  Result Date: 11/05/2017 CLINICAL DATA:  Continued surveillance subarachnoid hemorrhage. [place] a couple of nights ago. No loss of consciousness. EXAM: CT HEAD WITHOUT CONTRAST TECHNIQUE: Contiguous axial images were obtained from the base of the skull through the vertex without intravenous contrast. COMPARISON:  11/04/2017 CT head. FINDINGS: Brain: Slight involution of LEFT frontal subarachnoid hemorrhage noted on the previous exam. No parenchymal hemorrhage. Not previously reported, but present nevertheless, is an acute subdural hematoma on the RIGHT, along the convexity extending down into the middle cranial fossa, no significant midline shift, up to 5 mm thickness, slight involution compared with yesterday's scan. See series 5, image 45. Slight to minimal redistribution of  subdural blood along the RIGHT tentorium, no more than 1-2 mm thickness. Vascular: Calcification of the cavernous internal carotid arteries consistent with cerebrovascular atherosclerotic disease. No signs of intracranial large vessel occlusion. Skull: Calvarium intact.  RIGHT frontal scalp hematoma. Sinuses/Orbits: Clear sinuses.  BILATERAL cataract extraction. Other: No middle ear or mastoid fluid. IMPRESSION: Slight involution of previously unreported acute RIGHT subdural hematoma, up to 5 mm thickness without significant midline shift. Slight involution of previously reported LEFT frontal subarachnoid hemorrhage. No new areas of parenchymal hemorrhage or delayed intracerebral hematoma. No skull fracture.  RIGHT frontal scalp hematoma. Electronically Signed   By: Staci Righter M.D.   On: 11/05/2017 08:40   Ct Head Wo Contrast  Result Date: 11/04/2017 CLINICAL DATA:  Posttraumatic headache and neck pain after fall. EXAM: CT HEAD WITHOUT CONTRAST CT CERVICAL SPINE WITHOUT CONTRAST TECHNIQUE: Multidetector CT imaging of the head and cervical spine was performed following the standard protocol without intravenous contrast. Multiplanar CT image reconstructions of the cervical spine were also generated. COMPARISON:  CT scan of October 22, 2016. FINDINGS: CT HEAD FINDINGS Brain: Small amount of subarachnoid hemorrhage is noted in the left frontal lobe. No mass effect or midline shift is noted. Ventricular size is within normal limits. No acute infarction or mass lesion is noted. Vascular: No hyperdense vessel or unexpected calcification. Skull:  Normal. Negative for fracture or focal lesion. Sinuses/Orbits: No acute finding. Other: Small right frontal scalp hematoma is noted. CT CERVICAL SPINE FINDINGS Alignment: Minimal grade 1 anterolisthesis of C5-6 is noted secondary to posterior facet joint hypertrophy. Skull base and vertebrae: No acute fracture. No primary bone lesion or focal pathologic process. Soft tissues  and spinal canal: No prevertebral fluid or swelling. No visible canal hematoma. Disc levels: Severe degenerative disc disease is noted extending from C3-4 to C6-7. Upper chest: Negative. Other: None. IMPRESSION: Small focus of subarachnoid hemorrhage seen in left frontal lobe. Critical Value/emergent results were called by telephone at the time of interpretation on 11/04/2017 at 1:06 pm to Dr. Julianne Rice , who verbally acknowledged these results. Severe multilevel degenerative disc disease. No acute abnormality seen in the cervical spine. Electronically Signed   By: Marijo Conception, M.D.   On: 11/04/2017 13:06   Ct Cervical Spine Wo Contrast  Result Date: 11/04/2017 CLINICAL DATA:  Posttraumatic headache and neck pain after fall. EXAM: CT HEAD WITHOUT CONTRAST CT CERVICAL SPINE WITHOUT CONTRAST TECHNIQUE: Multidetector CT imaging of the head and cervical spine was performed following the standard protocol without intravenous contrast. Multiplanar CT image reconstructions of the cervical spine were also generated. COMPARISON:  CT scan of October 22, 2016. FINDINGS: CT HEAD FINDINGS Brain: Small amount of subarachnoid hemorrhage is noted in the left frontal lobe. No mass effect or midline shift is noted. Ventricular size is within normal limits. No acute infarction or mass lesion is noted. Vascular: No hyperdense vessel or unexpected calcification. Skull: Normal. Negative for fracture or focal lesion. Sinuses/Orbits: No acute finding. Other: Small right frontal scalp hematoma is noted. CT CERVICAL SPINE FINDINGS Alignment: Minimal grade 1 anterolisthesis of C5-6 is noted secondary to posterior facet joint hypertrophy. Skull base and vertebrae: No acute fracture. No primary bone lesion or focal pathologic process. Soft tissues and spinal canal: No prevertebral fluid or swelling. No visible canal hematoma. Disc levels: Severe degenerative disc disease is noted extending from C3-4 to C6-7. Upper chest: Negative.  Other: None. IMPRESSION: Small focus of subarachnoid hemorrhage seen in left frontal lobe. Critical Value/emergent results were called by telephone at the time of interpretation on 11/04/2017 at 1:06 pm to Dr. Julianne Rice , who verbally acknowledged these results. Severe multilevel degenerative disc disease. No acute abnormality seen in the cervical spine. Electronically Signed   By: Marijo Conception, M.D.   On: 11/04/2017 13:06   Dg Hip Unilat W Or Wo Pelvis 2-3 Views Right  Result Date: 11/04/2017 CLINICAL DATA:  Fall.  Right hip pain. EXAM: DG HIP (WITH OR WITHOUT PELVIS) 2-3V RIGHT COMPARISON:  04/16/2016 pelvic and right hip radiograph FINDINGS: There is slight cortical irregularity in the superior margin of right femoral neck on the pelvic view, suspicious for nondisplaced fracture. No additional potential fracture. No pelvic diastasis. No right hip dislocation. 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 Prominent degenerative changes in the visualized lower lumbar spine. No suspicious focal osseous lesions. IMPRESSION: Slight cortical irregularity in the superior right femoral neck on the pelvic view, suspicious for nondisplaced fracture. Recommend right hip CT for further evaluation. Electronically Signed   By: Ilona Sorrel M.D.   On: 11/04/2017 12:51     CBC Recent Labs  Lab 11/04/17 1154 11/05/17 0331  WBC 6.2 5.1  HGB 10.1* 8.8*  HCT 32.9* 29.0*  PLT 119* 89*  MCV 101.5* 101.4*  MCH 31.2 30.8  MCHC 30.7 30.3  RDW 14.5 14.6  LYMPHSABS  0.7  --   MONOABS 0.8  --   EOSABS 0.2  --   BASOSABS 0.0  --     Chemistries  Recent Labs  Lab 11/04/17 1154 11/05/17 0331  NA 139 140  K 5.2* 3.9  CL 105 104  CO2 28 29  GLUCOSE 95 112*  BUN 29* 26*  CREATININE 1.62* 1.49*  CALCIUM 9.6 9.0  AST 21  --   ALT 16  --   ALKPHOS 44  --   BILITOT 0.7  --    ------------------------------------------------------------------------------------------------------------------ CrCl cannot be  calculated (Unknown ideal weight.). ------------------------------------------------------------------------------------------------------------------ No results for input(s): HGBA1C in the last 72 hours. ------------------------------------------------------------------------------------------------------------------ No results for input(s): CHOL, HDL, LDLCALC, TRIG, CHOLHDL, LDLDIRECT in the last 72 hours. ------------------------------------------------------------------------------------------------------------------ No results for input(s): TSH, T4TOTAL, T3FREE, THYROIDAB in the last 72 hours.  Invalid input(s): FREET3 ------------------------------------------------------------------------------------------------------------------ No results for input(s): VITAMINB12, FOLATE, FERRITIN, TIBC, IRON, RETICCTPCT in the last 72 hours.  Coagulation profile No results for input(s): INR, PROTIME in the last 168 hours.  No results for input(s): DDIMER in the last 72 hours.  Cardiac Enzymes No results for input(s): CKMB, TROPONINI, MYOGLOBIN in the last 168 hours.  Invalid input(s): CK ------------------------------------------------------------------------------------------------------------------ Invalid input(s): POCBNP   CBG: No results for input(s): GLUCAP in the last 168 hours.     Studies: Dg Chest 2 View  Result Date: 11/04/2017 CLINICAL DATA:  Cough.  Shortness of breath.  Fall. EXAM: CHEST - 2 VIEW COMPARISON:  04/16/2016 FINDINGS: There is slight peribronchial thickening which is chronic or recurrent. Overall heart size and pulmonary vascularity are normal. Chronic slight elevation of the left hemidiaphragm. No acute bone abnormalities. IMPRESSION: Slight bronchitic changes which could be recurrent or chronic. Electronically Signed   By: Lorriane Shire M.D.   On: 11/04/2017 12:49   Ct Head Wo Contrast  Result Date: 11/05/2017 CLINICAL DATA:  Continued surveillance  subarachnoid hemorrhage. [place] a couple of nights ago. No loss of consciousness. EXAM: CT HEAD WITHOUT CONTRAST TECHNIQUE: Contiguous axial images were obtained from the base of the skull through the vertex without intravenous contrast. COMPARISON:  11/04/2017 CT head. FINDINGS: Brain: Slight involution of LEFT frontal subarachnoid hemorrhage noted on the previous exam. No parenchymal hemorrhage. Not previously reported, but present nevertheless, is an acute subdural hematoma on the RIGHT, along the convexity extending down into the middle cranial fossa, no significant midline shift, up to 5 mm thickness, slight involution compared with yesterday's scan. See series 5, image 45. Slight to minimal redistribution of subdural blood along the RIGHT tentorium, no more than 1-2 mm thickness. Vascular: Calcification of the cavernous internal carotid arteries consistent with cerebrovascular atherosclerotic disease. No signs of intracranial large vessel occlusion. Skull: Calvarium intact.  RIGHT frontal scalp hematoma. Sinuses/Orbits: Clear sinuses.  BILATERAL cataract extraction. Other: No middle ear or mastoid fluid. IMPRESSION: Slight involution of previously unreported acute RIGHT subdural hematoma, up to 5 mm thickness without significant midline shift. Slight involution of previously reported LEFT frontal subarachnoid hemorrhage. No new areas of parenchymal hemorrhage or delayed intracerebral hematoma. No skull fracture.  RIGHT frontal scalp hematoma. Electronically Signed   By: Staci Righter M.D.   On: 11/05/2017 08:40   Ct Head Wo Contrast  Result Date: 11/04/2017 CLINICAL DATA:  Posttraumatic headache and neck pain after fall. EXAM: CT HEAD WITHOUT CONTRAST CT CERVICAL SPINE WITHOUT CONTRAST TECHNIQUE: Multidetector CT imaging of the head and cervical spine was performed following the standard protocol without intravenous contrast. Multiplanar CT image reconstructions of the  cervical spine were also generated.  COMPARISON:  CT scan of October 22, 2016. FINDINGS: CT HEAD FINDINGS Brain: Small amount of subarachnoid hemorrhage is noted in the left frontal lobe. No mass effect or midline shift is noted. Ventricular size is within normal limits. No acute infarction or mass lesion is noted. Vascular: No hyperdense vessel or unexpected calcification. Skull: Normal. Negative for fracture or focal lesion. Sinuses/Orbits: No acute finding. Other: Small right frontal scalp hematoma is noted. CT CERVICAL SPINE FINDINGS Alignment: Minimal grade 1 anterolisthesis of C5-6 is noted secondary to posterior facet joint hypertrophy. Skull base and vertebrae: No acute fracture. No primary bone lesion or focal pathologic process. Soft tissues and spinal canal: No prevertebral fluid or swelling. No visible canal hematoma. Disc levels: Severe degenerative disc disease is noted extending from C3-4 to C6-7. Upper chest: Negative. Other: None. IMPRESSION: Small focus of subarachnoid hemorrhage seen in left frontal lobe. Critical Value/emergent results were called by telephone at the time of interpretation on 11/04/2017 at 1:06 pm to Dr. Julianne Rice , who verbally acknowledged these results. Severe multilevel degenerative disc disease. No acute abnormality seen in the cervical spine. Electronically Signed   By: Marijo Conception, M.D.   On: 11/04/2017 13:06   Ct Cervical Spine Wo Contrast  Result Date: 11/04/2017 CLINICAL DATA:  Posttraumatic headache and neck pain after fall. EXAM: CT HEAD WITHOUT CONTRAST CT CERVICAL SPINE WITHOUT CONTRAST TECHNIQUE: Multidetector CT imaging of the head and cervical spine was performed following the standard protocol without intravenous contrast. Multiplanar CT image reconstructions of the cervical spine were also generated. COMPARISON:  CT scan of October 22, 2016. FINDINGS: CT HEAD FINDINGS Brain: Small amount of subarachnoid hemorrhage is noted in the left frontal lobe. No mass effect or midline shift is  noted. Ventricular size is within normal limits. No acute infarction or mass lesion is noted. Vascular: No hyperdense vessel or unexpected calcification. Skull: Normal. Negative for fracture or focal lesion. Sinuses/Orbits: No acute finding. Other: Small right frontal scalp hematoma is noted. CT CERVICAL SPINE FINDINGS Alignment: Minimal grade 1 anterolisthesis of C5-6 is noted secondary to posterior facet joint hypertrophy. Skull base and vertebrae: No acute fracture. No primary bone lesion or focal pathologic process. Soft tissues and spinal canal: No prevertebral fluid or swelling. No visible canal hematoma. Disc levels: Severe degenerative disc disease is noted extending from C3-4 to C6-7. Upper chest: Negative. Other: None. IMPRESSION: Small focus of subarachnoid hemorrhage seen in left frontal lobe. Critical Value/emergent results were called by telephone at the time of interpretation on 11/04/2017 at 1:06 pm to Dr. Julianne Rice , who verbally acknowledged these results. Severe multilevel degenerative disc disease. No acute abnormality seen in the cervical spine. Electronically Signed   By: Marijo Conception, M.D.   On: 11/04/2017 13:06   Dg Hip Unilat W Or Wo Pelvis 2-3 Views Right  Result Date: 11/04/2017 CLINICAL DATA:  Fall.  Right hip pain. EXAM: DG HIP (WITH OR WITHOUT PELVIS) 2-3V RIGHT COMPARISON:  04/16/2016 pelvic and right hip radiograph FINDINGS: There is slight cortical irregularity in the superior margin of right femoral neck on the pelvic view, suspicious for nondisplaced fracture. No additional potential fracture. No pelvic diastasis. No right hip dislocation. 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 Prominent degenerative changes in the visualized lower lumbar spine. No suspicious focal osseous lesions. IMPRESSION: Slight cortical irregularity in the superior right femoral neck on the pelvic view, suspicious for nondisplaced fracture. Recommend right hip CT for further evaluation.  Electronically Signed   By: Ilona Sorrel M.D.   On: 11/04/2017 12:51      Lab Results  Component Value Date   HGBA1C 5.6 06/16/2017   HGBA1C 5.5 03/19/2017   HGBA1C 5.9 10/21/2016   Lab Results  Component Value Date   MICROALBUR 0.8 02/28/2014   LDLCALC 69 03/19/2017   CREATININE 1.49 (H) 11/05/2017       Scheduled Meds: . allopurinol  200 mg Oral Daily  . docusate sodium  100 mg Oral BID  . metoprolol succinate  75 mg Oral Daily  . mometasone-formoterol  2 puff Inhalation BID  . pravastatin  10 mg Oral Daily  . sertraline  100 mg Oral Daily  . sodium chloride flush  3 mL Intravenous Q12H   Continuous Infusions:   LOS: 0 days    Time spent: >30 MINS    Reyne Dumas  [facility] Pager [phone]. If 7PM-7AM, please contact night-coverage at [email or web address], password [facility] 11/05/2017, 12:33 PM  LOS: 0 days

## 2017-11-05 NOTE — Progress Notes (Signed)
Chaplain Note: follow up to Advance Directive Request  I met with pt. And her daughter to follow up on her interest in completing an Advance Directive. Pt. Indicated she thought she needed one, but was not clear about the two documents ( HCPOA and Living Will. ) I reviewed each and answered questions. They were to discuss  What she wanted to do and I told them I would check back in the afternoon and help her complete the documents if she decided to do so.   Wells Guiles  CHaplain

## 2017-11-05 NOTE — Progress Notes (Signed)
Subjective: The patient is alert and pleasant.  Has no complaints.  Her daughter is at the bedside.  Objective: Vital signs in last 24 hours: Temp:  [97.6 F (36.4 C)-98.9 F (37.2 C)] 98.7 F (37.1 C) (10/17 0353) Pulse Rate:  [76-96] 87 (10/17 0353) Resp:  [16-28] 18 (10/17 0353) BP: (103-136)/(40-78) 103/40 (10/17 0353) SpO2:  [94 %-100 %] 94 % (10/17 0853) Estimated body mass index is 32.28 kg/m as calculated from the following:   Height as of 09/17/17: 5\' 5"  (1.651 m).   Weight as of 10/16/17: 88 kg.   Intake/Output from previous day: 10/16 0701 - 10/17 0700 In: -  Out: 500 [Urine:500] Intake/Output this shift: No intake/output data recorded.  Physical exam patient is alert and oriented x3.  Her speech and strength is normal.  Her left pupil is slightly larger than her right.  I reviewed the patient's follow-up head CT performed today.  Her right subdural hematoma is slightly enlarged from yesterday.  The subarachnoid hemorrhage is less prominent.  There is no significant mass-effect.  Lab Results: Recent Labs    11/04/17 1154 11/05/17 0331  WBC 6.2 5.1  HGB 10.1* 8.8*  HCT 32.9* 29.0*  PLT 119* 89*   BMET Recent Labs    11/04/17 1154 11/05/17 0331  NA 139 140  K 5.2* 3.9  CL 105 104  CO2 28 29  GLUCOSE 95 112*  BUN 29* 26*  CREATININE 1.62* 1.49*  CALCIUM 9.6 9.0    Studies/Results: Dg Chest 2 View  Result Date: 11/04/2017 CLINICAL DATA:  Cough.  Shortness of breath.  Fall. EXAM: CHEST - 2 VIEW COMPARISON:  04/16/2016 FINDINGS: There is slight peribronchial thickening which is chronic or recurrent. Overall heart size and pulmonary vascularity are normal. Chronic slight elevation of the left hemidiaphragm. No acute bone abnormalities. IMPRESSION: Slight bronchitic changes which could be recurrent or chronic. Electronically Signed   By: Lorriane Shire M.D.   On: 11/04/2017 12:49   Ct Head Wo Contrast  Result Date: 11/05/2017 CLINICAL DATA:  Continued  surveillance subarachnoid hemorrhage. [place] a couple of nights ago. No loss of consciousness. EXAM: CT HEAD WITHOUT CONTRAST TECHNIQUE: Contiguous axial images were obtained from the base of the skull through the vertex without intravenous contrast. COMPARISON:  11/04/2017 CT head. FINDINGS: Brain: Slight involution of LEFT frontal subarachnoid hemorrhage noted on the previous exam. No parenchymal hemorrhage. Not previously reported, but present nevertheless, is an acute subdural hematoma on the RIGHT, along the convexity extending down into the middle cranial fossa, no significant midline shift, up to 5 mm thickness, slight involution compared with yesterday's scan. See series 5, image 45. Slight to minimal redistribution of subdural blood along the RIGHT tentorium, no more than 1-2 mm thickness. Vascular: Calcification of the cavernous internal carotid arteries consistent with cerebrovascular atherosclerotic disease. No signs of intracranial large vessel occlusion. Skull: Calvarium intact.  RIGHT frontal scalp hematoma. Sinuses/Orbits: Clear sinuses.  BILATERAL cataract extraction. Other: No middle ear or mastoid fluid. IMPRESSION: Slight involution of previously unreported acute RIGHT subdural hematoma, up to 5 mm thickness without significant midline shift. Slight involution of previously reported LEFT frontal subarachnoid hemorrhage. No new areas of parenchymal hemorrhage or delayed intracerebral hematoma. No skull fracture.  RIGHT frontal scalp hematoma. Electronically Signed   By: Staci Righter M.D.   On: 11/05/2017 08:40   Ct Head Wo Contrast  Result Date: 11/04/2017 CLINICAL DATA:  Posttraumatic headache and neck pain after fall. EXAM: CT HEAD WITHOUT CONTRAST CT CERVICAL  SPINE WITHOUT CONTRAST TECHNIQUE: Multidetector CT imaging of the head and cervical spine was performed following the standard protocol without intravenous contrast. Multiplanar CT image reconstructions of the cervical spine were also  generated. COMPARISON:  CT scan of October 22, 2016. FINDINGS: CT HEAD FINDINGS Brain: Small amount of subarachnoid hemorrhage is noted in the left frontal lobe. No mass effect or midline shift is noted. Ventricular size is within normal limits. No acute infarction or mass lesion is noted. Vascular: No hyperdense vessel or unexpected calcification. Skull: Normal. Negative for fracture or focal lesion. Sinuses/Orbits: No acute finding. Other: Small right frontal scalp hematoma is noted. CT CERVICAL SPINE FINDINGS Alignment: Minimal grade 1 anterolisthesis of C5-6 is noted secondary to posterior facet joint hypertrophy. Skull base and vertebrae: No acute fracture. No primary bone lesion or focal pathologic process. Soft tissues and spinal canal: No prevertebral fluid or swelling. No visible canal hematoma. Disc levels: Severe degenerative disc disease is noted extending from C3-4 to C6-7. Upper chest: Negative. Other: None. IMPRESSION: Small focus of subarachnoid hemorrhage seen in left frontal lobe. Critical Value/emergent results were called by telephone at the time of interpretation on 11/04/2017 at 1:06 pm to Dr. Julianne Rice , who verbally acknowledged these results. Severe multilevel degenerative disc disease. No acute abnormality seen in the cervical spine. Electronically Signed   By: Marijo Conception, M.D.   On: 11/04/2017 13:06   Ct Cervical Spine Wo Contrast  Result Date: 11/04/2017 CLINICAL DATA:  Posttraumatic headache and neck pain after fall. EXAM: CT HEAD WITHOUT CONTRAST CT CERVICAL SPINE WITHOUT CONTRAST TECHNIQUE: Multidetector CT imaging of the head and cervical spine was performed following the standard protocol without intravenous contrast. Multiplanar CT image reconstructions of the cervical spine were also generated. COMPARISON:  CT scan of October 22, 2016. FINDINGS: CT HEAD FINDINGS Brain: Small amount of subarachnoid hemorrhage is noted in the left frontal lobe. No mass effect or midline  shift is noted. Ventricular size is within normal limits. No acute infarction or mass lesion is noted. Vascular: No hyperdense vessel or unexpected calcification. Skull: Normal. Negative for fracture or focal lesion. Sinuses/Orbits: No acute finding. Other: Small right frontal scalp hematoma is noted. CT CERVICAL SPINE FINDINGS Alignment: Minimal grade 1 anterolisthesis of C5-6 is noted secondary to posterior facet joint hypertrophy. Skull base and vertebrae: No acute fracture. No primary bone lesion or focal pathologic process. Soft tissues and spinal canal: No prevertebral fluid or swelling. No visible canal hematoma. Disc levels: Severe degenerative disc disease is noted extending from C3-4 to C6-7. Upper chest: Negative. Other: None. IMPRESSION: Small focus of subarachnoid hemorrhage seen in left frontal lobe. Critical Value/emergent results were called by telephone at the time of interpretation on 11/04/2017 at 1:06 pm to Dr. Julianne Rice , who verbally acknowledged these results. Severe multilevel degenerative disc disease. No acute abnormality seen in the cervical spine. Electronically Signed   By: Marijo Conception, M.D.   On: 11/04/2017 13:06   Dg Hip Unilat W Or Wo Pelvis 2-3 Views Right  Result Date: 11/04/2017 CLINICAL DATA:  Fall.  Right hip pain. EXAM: DG HIP (WITH OR WITHOUT PELVIS) 2-3V RIGHT COMPARISON:  04/16/2016 pelvic and right hip radiograph FINDINGS: There is slight cortical irregularity in the superior margin of right femoral neck on the pelvic view, suspicious for nondisplaced fracture. No additional potential fracture. No pelvic diastasis. No right hip dislocation. 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 Prominent degenerative changes in the visualized lower lumbar spine. No suspicious focal  osseous lesions. IMPRESSION: Slight cortical irregularity in the superior right femoral neck on the pelvic view, suspicious for nondisplaced fracture. Recommend right hip CT for further  evaluation. Electronically Signed   By: Ilona Sorrel M.D.   On: 11/04/2017 12:51    Assessment/Plan: Subdural hematoma, subarachnoid hemorrhage: The patient is doing well clinically.  Her CAT scan is stable.  She is not anticoagulated.  She can be discharged home from my point of view and follow-up with me in the office in a couple weeks.  I have answered all their questions.  Syncopal episode: The medical doctors are working on this.  LOS: 0 days     Ophelia Charter 11/05/2017, 10:02 AM

## 2017-11-05 NOTE — Progress Notes (Signed)
Pt right pupil noted to be 67mm and brisk, left pupil is 4-73mm and is sluggish, paged [facility] provider to notify of this change in neuro check. Arlis Porta

## 2017-11-06 ENCOUNTER — Inpatient Hospital Stay (HOSPITAL_COMMUNITY): Payer: Medicare HMO

## 2017-11-06 LAB — BASIC METABOLIC PANEL
ANION GAP: 6 (ref 5–15)
BUN: 26 mg/dL — AB (ref 8–23)
CHLORIDE: 107 mmol/L (ref 98–111)
CO2: 27 mmol/L (ref 22–32)
Calcium: 9.2 mg/dL (ref 8.9–10.3)
Creatinine, Ser: 1.33 mg/dL — ABNORMAL HIGH (ref 0.44–1.00)
GFR calc Af Amer: 44 mL/min — ABNORMAL LOW (ref >60)
GFR, EST NON AFRICAN AMERICAN: 38 mL/min — AB (ref >60)
GLUCOSE: 110 mg/dL — AB (ref 70–99)
POTASSIUM: 4.3 mmol/L (ref 3.5–5.1)
Sodium: 140 mmol/L (ref 135–145)

## 2017-11-06 MED ORDER — TRAMADOL HCL 50 MG PO TABS
50.0000 mg | ORAL_TABLET | Freq: Four times a day (QID) | ORAL | Status: DC | PRN
  Administered 2017-11-07: 50 mg via ORAL
  Filled 2017-11-06: qty 1

## 2017-11-06 NOTE — Progress Notes (Signed)
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 Patient's case, including imaging, reviewed by Dr. Estanislado Pandy and I.  Went to see patient in her room to introduce myself and our service. Patient awake and alert laying in bed watching tv. Accompanied by daughter at bedside.  Discussed patient's ACOM aneurysm. Explained that Dr. Estanislado Pandy is not able to consult with her today due to scheduling issues. Explained that we plan to have patient come in for an outpatient consult 2-4 weeks after discharge. Informed patient that our schedulers will call her to set up this appointment once she is discharged.  All questions answered and concerns addressed. Patient and daughter convey understanding and agree with plan.  Bea Graff Tonesha Tsou, PA-C 11/06/2017, 9:56 AM

## 2017-11-06 NOTE — Evaluation (Signed)
Physical Therapy Evaluation Patient Details Name: [name] MRN: [id number] DOB: 1940/09/04 Today's Date: 11/06/2017   History of Present Illness  Pt is a 77 y/o female admitted following syncopal episode. Found to have R subdural hematoma and trace L subdural hematoma. Pt also with questionable R femoral neck fx, however, per RN and MD, may have pt work up with outpatient ortho. PMH includes HTN, DM, CHF, asthma, DVT, COPD, and CKD.   Clinical Impression  Pt admitted secondary to problem above with deficits below. Pt requiring min guard to supervision for OOB mobility using RW. Educated about use of RW at home to increase stability. Of note, pt with 2 instances of sharp hip pain during ambulation, however, subsided with standing rest. Otherwise pain did not seem to limit pt during ambulation; spoke with MD about pain following session. Pt reports daughter will be able to provide 24/7 support at d/c. Will continue to follow acutely to maximize functional mobility independence and safety.     Follow Up Recommendations Home health PT;Supervision/Assistance - 24 hour    Equipment Recommendations  Rolling walker with 5" wheels    Recommendations for Other Services       Precautions / Restrictions Precautions Precautions: Fall Restrictions Weight Bearing Restrictions: No      Mobility  Bed Mobility Overal bed mobility: Needs Assistance Bed Mobility: Supine to Sit     Supine to sit: Min assist     General bed mobility comments: Min A for RLE assist to come to sitting. Pt reporting pain in R groin.   Transfers Overall transfer level: Needs assistance Equipment used: Rolling walker (2 wheeled) Transfers: Sit to/from Stand Sit to Stand: Supervision         General transfer comment: Supervision and increased time to perform transfer. Verbal cues for safe hand placement.    Ambulation/Gait Ambulation/Gait assistance: Min guard Gait Distance (Feet): 100 Feet Assistive  device: Rolling walker (2 wheeled) Gait Pattern/deviations: Step-to pattern;Step-through pattern;Decreased step length - right;Decreased step length - left;Decreased weight shift to right;Antalgic Gait velocity: Decreased    General Gait Details: Slow, antalgic gait. Pt with 2 instances of sharp pain in R hip which subsided; did not seem to limit ambulation, however. Increased SOB noted, however, oxygen sats at 90% on RA.   Stairs            Wheelchair Mobility    Modified Rankin (Stroke Patients Only)       Balance Overall balance assessment: Needs assistance Sitting-balance support: No upper extremity supported;Feet supported Sitting balance-Leahy Scale: Fair     Standing balance support: Bilateral upper extremity supported;During functional activity Standing balance-Leahy Scale: Poor Standing balance comment: Reliant on BUE support                              Pertinent Vitals/Pain Pain Assessment: Faces Faces Pain Scale: Hurts even more Pain Location: headache; R groin  Pain Descriptors / Indicators: Headache;Grimacing;Guarding Pain Intervention(s): Limited activity within patient's tolerance;Monitored during session;Repositioned    Home Living Family/patient expects to be discharged to:: Private residence Living Arrangements: Alone Available Help at Discharge: Family;Available 24 hours/day Type of Home: House Home Access: Ramped entrance     Home Layout: One level Home Equipment: Walker - 4 wheels;Cane - single point;Shower seat - built in      Prior Function Level of Independence: Architect  Dominance   Dominant Hand: Right    Extremity/Trunk Assessment   Upper Extremity Assessment Upper Extremity Assessment: Defer to OT evaluation    Lower Extremity Assessment Lower Extremity Assessment: RLE deficits/detail RLE Deficits / Details: RLE movement limited secondary to pain in R groin. Required assist for RLE  during bed mobility.     Cervical / Trunk Assessment Cervical / Trunk Assessment: Other exceptions Cervical / Trunk Exceptions: Facial bruising in R side of face.   Communication   Communication: No difficulties  Cognition Arousal/Alertness: Awake/alert Behavior During Therapy: WFL for tasks assessed/performed Overall Cognitive Status: Within Functional Limits for tasks assessed                                        General Comments General comments (skin integrity, edema, etc.): Pt reports daughter will be able to provide 24/7 assist at d/c.     Exercises     Assessment/Plan    PT Assessment Patient needs continued PT services  PT Problem List Decreased strength;Decreased balance;Decreased activity tolerance;Decreased mobility;Cardiopulmonary status limiting activity;Decreased knowledge of use of DME;Pain       PT Treatment Interventions DME instruction;Gait training;Functional mobility training;Therapeutic activities;Therapeutic exercise;Balance training;Patient/family education    PT Goals (Current goals can be found in the Care Plan section)  Acute Rehab PT Goals Patient Stated Goal: to go home, hopefully today PT Goal Formulation: With patient/family Time For Goal Achievement: 11/20/17 Potential to Achieve Goals: Good    Frequency Min 3X/week   Barriers to discharge        Co-evaluation               AM-PAC PT "6 Clicks" Daily Activity  Outcome Measure Difficulty turning over in bed (including adjusting bedclothes, sheets and blankets)?: A Little Difficulty moving from lying on back to sitting on the side of the bed? : Unable Difficulty sitting down on and standing up from a chair with arms (e.g., wheelchair, bedside commode, etc,.)?: Unable Help needed moving to and from a bed to chair (including a wheelchair)?: A Little Help needed walking in hospital room?: A Little Help needed climbing 3-5 steps with a railing? : A Lot 6 Click Score:  13    End of Session Equipment Utilized During Treatment: Gait belt Activity Tolerance: Patient limited by fatigue Patient left: in chair;with call bell/phone within reach;with chair alarm set;with family/visitor present Nurse Communication: Mobility status PT Visit Diagnosis: Other abnormalities of gait and mobility (R26.89);Pain;Muscle weakness (generalized) (M62.81) Pain - Right/Left: Right Pain - part of body: Hip    Time: 1407-1430 PT Time Calculation (min) (ACUTE ONLY): 23 min   Charges:   PT Evaluation $PT Eval Low Complexity: 1 Low PT Treatments $Gait Training: 8-22 mins        Leighton Ruff, PT, DPT  Acute Rehabilitation Services  Pager: [phone] Office: [phone]   Rudean Hitt 11/06/2017, 2:39 PM

## 2017-11-06 NOTE — Progress Notes (Signed)
PROGRESS NOTE    [name]  RJJ:[id number] DOB: [birth year] DOA: 11/04/2017 PCP: Mosie Lukes, MD    Brief Narrative:  77 year old woman with past medical history relevant for hypertension, stage IV CKD, hyperlipidemia, type 2 diabetes, hyperlipidemia, gout, COPD who presented with fall and found to have small subarachnoid hemorrhage as well as subdural bleed.   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  On telemetry she has not had any arrhythmias.  She did not have any chest pain or palpitations prior to her syncopal episode.  Carlita suspect it is most likely related to the subarachnoid hemorrhage. 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 -Interventional radiology will follow the patient in 2 to 4 weeks as an outpatient.  #) subdural: This was secondary to fall from syncopal episode.  This appears to be stable.  Patient was not on any anticoagulants.  Per neurosurgery she is stable for discharge. -Hold aspirin for 1 month until patient sees neurosurgery -PT pending  #) Hip pain: Noted to have on x-ray of hips irregularity  #) COPD: - Continue ICS/LABA -Continue PRN bronchodilators -Continue montelukast 10 mg nightly as needed  #) Gout: -Continue allopurinol 200 mg daily  #) Hypertension/hyperlipidemia: -Continue metoprolol succinate 75 mg daily - Hold aspirin 81 -Continue pravastatin 10 mg daily  #) Pain/psych: -Continue sertraline 100 mg daily  Fluids: Tolerating p.o. Electrodes:  Monitor and supplement Nutrition: Heart healthy diet Exam prophylaxis: SCDs next Disposition: Pending PT evaluation  DNR   Consultants:   Interventional radiology/neurology  Neurosurgery  Procedures:  Echo 11/05/2016:Study Conclusions  - Left ventricle: The cavity size was normal. Wall thickness was   normal. Systolic function was normal. The estimated ejection   fraction was in the range of 55% to 60%. Although no diagnostic   regional wall motion abnormality was identified, this possibility   cannot be completely excluded on the basis of this study. Doppler   parameters are consistent with abnormal left ventricular   relaxation (grade 1 diastolic dysfunction). - Aortic valve: There was no stenosis. - Mitral valve: Mildly to moderately calcified annulus. There was   trivial regurgitation. - Left atrium: The atrium was mildly dilated. - Right ventricle: The cavity size was normal. Systolic function   was normal. - Pulmonary arteries: No complete TR doppler jet so unable to   estimate PA systolic pressure. - Inferior vena cava: The vessel was normal in size. The   respirophasic diameter changes were in the normal range (>= 50%),   consistent with normal central venous pressure.  Impressions:  - Normal LV size with EF 55-60%. Normal RV size and systolic    function. No significant valvular abnormalities.  Antimicrobials:  None   Subjective: Patient reports she only has a little bit of a headache but denies any nausea, vomiting, diarrhea, cough, congestion.  She is quite eager to go home.  Unfortunately just prior to discharge she worked with physical therapy who reported some concerns about her hip.  On review of the chart patient's x-ray of the hip showed some  irregularity and recommended a CT scan of the hip.  This is pending.  Objective: Vitals:   11/06/17 0416 11/06/17 0804 11/06/17 0847 11/06/17 1227  BP: 129/65 (!) 111/94  (!) 114/91  Pulse: 81 68  79    Resp: 18 16  18   Temp: 98.4 F (36.9 C) 98.4 F (36.9 C)  98.1 F (36.7 C)  TempSrc: Oral Oral  Oral  SpO2: 99% 100% 96% 94%    Intake/Output Summary (Last 24 hours) at 11/06/2017 1559 Last data filed at 11/05/2017 2202 Gross per 24 hour  Intake 363 ml  Output -  Net 363 ml   There were no vitals filed for this visit.  Examination:  General exam: Appears calm and comfortable  Respiratory system: Clear to auscultation. Respiratory effort normal. Cardiovascular system: Regular rate and rhythm, no murmurs. Gastrointestinal system: Abdomen is nondistended, soft and nontender. No organomegaly or masses felt. Normal bowel sounds heard. Central nervous system: Alert and oriented. No focal neurological deficits. Extremities: Trace lower extremity edema, no pain on internal or external rotation of the hip. Skin: Contusions noted over the head Psychiatry: Judgement and insight appear normal. Mood & affect appropriate.     Data Reviewed: I have personally reviewed following labs and imaging studies  CBC: Recent Labs  Lab 11/04/17 1154 11/05/17 0331  WBC 6.2 5.1  NEUTROABS 4.5  --   HGB 10.1* 8.8*  HCT 32.9* 29.0*  MCV 101.5* 101.4*  PLT 119* 89*   Basic Metabolic Panel: Recent Labs  Lab 11/04/17 1154 11/05/17 0331 11/06/17 0422  NA 139 140 140  K 5.2* 3.9 4.3  CL 105 104 107  CO2 28 29 27   GLUCOSE 95 112* 110*  BUN 29* 26* 26*  CREATININE 1.62* 1.49* 1.33*  CALCIUM 9.6 9.0 9.2   GFR: CrCl cannot be calculated (Unknown ideal weight.). Liver Function Tests: Recent Labs  Lab 11/04/17 1154  AST 21  ALT 16  ALKPHOS 44  BILITOT 0.7  PROT 6.6  ALBUMIN 3.9   No results for input(s): LIPASE, AMYLASE in the last 168 hours. No results for input(s): AMMONIA in the last 168 hours. Coagulation Profile: No results for input(s): INR, PROTIME in the last 168 hours. Cardiac Enzymes: No results for input(s): CKTOTAL, CKMB, CKMBINDEX, TROPONINI in the last 168  hours. BNP (last 3 results) No results for input(s): PROBNP in the last 8760 hours. HbA1C: No results for input(s): HGBA1C in the last 72 hours. CBG: No results for input(s): GLUCAP in the last 168 hours. Lipid Profile: No results for input(s): CHOL, HDL, LDLCALC, TRIG, CHOLHDL, LDLDIRECT in the last 72 hours. Thyroid Function Tests: No results for input(s): TSH, T4TOTAL, FREET4, T3FREE, THYROIDAB in the last 72 hours. Anemia Panel: No results for input(s): VITAMINB12, FOLATE, FERRITIN, TIBC, IRON, RETICCTPCT in the last 72 hours. Sepsis Labs: No results for input(s): PROCALCITON, LATICACIDVEN in the last 168 hours.  No results found for this or any previous visit (from the past 240 hour(s)).       Radiology Studies: Ct Head Wo Contrast  Result Date: 11/05/2017 CLINICAL DATA:  Continued surveillance subarachnoid hemorrhage. [place] a couple of nights ago. No loss of consciousness. EXAM: CT HEAD WITHOUT CONTRAST TECHNIQUE: Contiguous axial images were obtained from the base of the skull through the vertex without intravenous contrast. COMPARISON:  11/04/2017 CT head. FINDINGS: Brain: Slight involution of LEFT frontal subarachnoid hemorrhage noted on the previous exam. No parenchymal hemorrhage. Not previously reported, but present nevertheless, is an acute subdural hematoma  on the RIGHT, along the convexity extending down into the middle cranial fossa, no significant midline shift, up to 5 mm thickness, slight involution compared with yesterday's scan. See series 5, image 45. Slight to minimal redistribution of subdural blood along the RIGHT tentorium, no more than 1-2 mm thickness. Vascular: Calcification of the cavernous internal carotid arteries consistent with cerebrovascular atherosclerotic disease. No signs of intracranial large vessel occlusion. Skull: Calvarium intact.  RIGHT frontal scalp hematoma. Sinuses/Orbits: Clear sinuses.  BILATERAL cataract extraction. Other: No middle ear or  mastoid fluid. IMPRESSION: Slight involution of previously unreported acute RIGHT subdural hematoma, up to 5 mm thickness without significant midline shift. Slight involution of previously reported LEFT frontal subarachnoid hemorrhage. No new areas of parenchymal hemorrhage or delayed intracerebral hematoma. No skull fracture.  RIGHT frontal scalp hematoma. Electronically Signed   By: Staci Righter M.D.   On: 11/05/2017 08:40   Mr Jodene Nam Head Wo Contrast  Result Date: 11/05/2017 CLINICAL DATA:  77 year old female status post fall with small right side subdural hematoma, small volume left frontal convexity subarachnoid hemorrhage. Pupil size asymmetry. EXAM: MRI HEAD WITHOUT CONTRAST MRA HEAD WITHOUT CONTRAST TECHNIQUE: Multiplanar, multiecho pulse sequences of the brain and surrounding structures were obtained without intravenous contrast. Angiographic images of the head were obtained using MRA technique without contrast. COMPARISON:  Head CT 0803 hours today and earlier. FINDINGS: MRI HEAD FINDINGS Brain: Trace subarachnoid hemorrhage evident at the left operculum. There might also be trace subarachnoid blood over the left parietal convexity seen as a small area of diffusion abnormality on series 7, image 37. There are small bilateral subdural hematomas. The right side is more extensive and measures between 2 and 5 millimeters in thickness. The left side subdural is primarily along the posterior convexity and measures 1-2 millimeters in thickness. See series 15, image 14. No midline shift. No intraventricular hemorrhage or other extra-axial blood. No ventriculomegaly. No restricted diffusion convincing for acute infarct. No encephalomalacia or chronic cerebral blood products identified. Cervicomedullary junction and pituitary are within normal limits. Vascular: Major intracranial vascular flow voids are preserved. Skull and upper cervical spine: Degenerative ligamentous hypertrophy about the odontoid results in  mild cervicomedullary junction stenosis. Advanced degenerative changes in the cervical spine at C3-C4 and C4-C5 with at least mild degenerative spinal stenosis on series 13 image 11. Sinuses/Orbits: Negative orbits, postoperative changes to both globes. Paranasal sinuses and mastoids are stable and well pneumatized. Other: Visible internal auditory structures appear normal. Right superior convexity mild scalp hematoma. MRA HEAD FINDINGS Antegrade flow in the posterior circulation. Codominant distal vertebral arteries with irregularity compatible with atherosclerosis but no significant stenosis. Patent PICA origins and vertebrobasilar junction. Basilar artery irregularity without stenosis. SCA and PCA origins are patent without stenosis. Both posterior communicating arteries are present. Bilateral PCA branches are within normal limits. Antegrade flow in both ICA siphons. Siphon irregularity greater on the left. No significant siphon stenosis. Ophthalmic and posterior communicating artery origins are normal. Patent carotid termini. Normal MCA and ACA origins. There is ectasia of the anterior communicating artery versus a small 2-3 millimeter aneurysm as seen on series 9, image 136. Visible ACA branches are within normal limits. MCA M1 segments, MCA bifurcations and visible MCA branches are patent without stenosis. IMPRESSION: MRI: 1. There are small Bilateral Subdural Hematomas, larger on the right: 5 mm right and 1-2 mm left SDH with no midline shift or significant mass effect. 2. Trace left hemisphere subarachnoid hemorrhage. 3. No other acute intracranial abnormality. Mild left scalp hematoma.  4. Advanced cervical spine degeneration. 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 MRA: 1. Small Aneurysm versus ectasia of the Anterior Communicating Artery, 2-3 mm. In light of the small lesion size, surveillance MRA (such as annual or biennial) might be preferred although  Neuro-Interventional Radiology can be consulted to evaluate the appropriateness of potential treatment. Non-emergent evaluation can be arranged by calling [phone] during usual hours. Emergency evaluation can be requested by paging [phone]. 2. Intracranial atherosclerosis without significant arterial stenosis. Electronically Signed   By: Genevie Ann M.D.   On: 11/05/2017 14:39   Mr Brain Wo Contrast  Result Date: 11/05/2017 CLINICAL DATA:  77 year old female status post fall with small right side subdural hematoma, small volume left frontal convexity subarachnoid hemorrhage. Pupil size asymmetry. EXAM: MRI HEAD WITHOUT CONTRAST MRA HEAD WITHOUT CONTRAST TECHNIQUE: Multiplanar, multiecho pulse sequences of the brain and surrounding structures were obtained without intravenous contrast. Angiographic images of the head were obtained using MRA technique without contrast. COMPARISON:  Head CT 0803 hours today and earlier. FINDINGS: MRI HEAD FINDINGS Brain: Trace subarachnoid hemorrhage evident at the left operculum. There might also be trace subarachnoid blood over the left parietal convexity seen as a small area of diffusion abnormality on series 7, image 37. There are small bilateral subdural hematomas. The right side is more extensive and measures between 2 and 5 millimeters in thickness. The left side subdural is primarily along the posterior convexity and measures 1-2 millimeters in thickness. See series 15, image 14. No midline shift. No intraventricular hemorrhage or other extra-axial blood. No ventriculomegaly. No restricted diffusion convincing for acute infarct. No encephalomalacia or chronic cerebral blood products identified. Cervicomedullary junction and pituitary are within normal limits. Vascular: Major intracranial vascular flow voids are preserved. Skull and upper cervical spine: Degenerative ligamentous hypertrophy about the odontoid results in mild cervicomedullary junction stenosis. Advanced  degenerative changes in the cervical spine at C3-C4 and C4-C5 with at least mild degenerative spinal stenosis on series 13 image 11. Sinuses/Orbits: Negative orbits, postoperative changes to both globes. Paranasal sinuses and mastoids are stable and well pneumatized. Other: Visible internal auditory structures appear normal. Right superior convexity mild scalp hematoma. MRA HEAD FINDINGS Antegrade flow in the posterior circulation. Codominant distal vertebral arteries with irregularity compatible with atherosclerosis but no significant stenosis. Patent PICA origins and vertebrobasilar junction. Basilar artery irregularity without stenosis. SCA and PCA origins are patent without stenosis. Both posterior communicating arteries are present. Bilateral PCA branches are within normal limits. Antegrade flow in both ICA siphons. Siphon irregularity greater on the left. No significant siphon stenosis. Ophthalmic and posterior communicating artery origins are normal. Patent carotid termini. Normal MCA and ACA origins. There is ectasia of the anterior communicating artery versus a small 2-3 millimeter aneurysm as seen on series 9, image 136. Visible ACA branches are within normal limits. MCA M1 segments, MCA bifurcations and visible MCA branches are patent without stenosis. IMPRESSION: MRI: 1. There are small Bilateral Subdural Hematomas, larger on the right: 5 mm right and 1-2 mm left SDH with no midline shift or significant mass effect. 2. Trace left hemisphere subarachnoid hemorrhage. 3. No other acute intracranial abnormality. Mild left scalp hematoma. 4. Advanced cervical spine degeneration. 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 MRA: 1. Small Aneurysm versus ectasia of the Anterior Communicating Artery, 2-3 mm. In light of the small lesion size, surveillance MRA (such as annual or biennial) might be preferred although Neuro-Interventional Radiology can be consulted to evaluate the  appropriateness of potential treatment. Non-emergent evaluation can be arranged by calling [phone] during usual hours. Emergency evaluation can be requested by paging [phone]. 2. Intracranial atherosclerosis without significant arterial stenosis. Electronically Signed   By: Genevie Ann M.D.   On: 11/05/2017 14:39        Scheduled Meds: . allopurinol  200 mg Oral Daily  . docusate sodium  100 mg Oral BID  . metoprolol succinate  75 mg Oral Daily  . mometasone-formoterol  2 puff Inhalation BID  . pravastatin  10 mg Oral Daily  . sertraline  100 mg Oral Daily  . sodium chloride flush  3 mL Intravenous Q12H   Continuous Infusions:   LOS: 1 day    Time spent: [place], MD [facility]  If 7PM-7AM, please contact night-coverage [email or web address] Password [email or web address] 11/06/2017, 3:59 PM

## 2017-11-06 NOTE — Plan of Care (Signed)

## 2017-11-06 NOTE — Care Management Note (Signed)
Case Management Note  Patient Details  Name: [name] MRN: [id number] Date of Birth: [birth year]  Subjective/Objective:  Pt admitted with ICH. She is from home alone. Daughter assists her at home and can provide more assistance as needed.  She has: walker, cane, seat in shower. She denies any issues with obtaining her medications.  She has no issues with transportation.                    Action/Plan: Recommendations are for [facility]. CM met with the patient and her daughter and provided choice. They selected [facility]. Cory with [facility] notified.  MD please place order and F2F for [facility] services.  Daughter to provide transport home.   Expected Discharge Date:                  Expected Discharge Plan:  [place]  In-House Referral:     Discharge planning Services  CM Consult  Post Acute Care Choice:  Home Health Choice offered to:  Patient, Adult Children  DME Arranged:    DME Agency:     HH Arranged:    [place]:  [place]  Status of Service:  In process, will continue to follow  If discussed at Long Length of Stay Meetings, dates discussed:    Additional Comments:  Pollie Friar, RN 11/06/2017, 4:22 PM

## 2017-11-07 LAB — CBC
HCT: 28.6 % — ABNORMAL LOW (ref 36.0–46.0)
Hemoglobin: 9 g/dL — ABNORMAL LOW (ref 12.0–15.0)
MCH: 31.8 pg (ref 26.0–34.0)
MCHC: 31.5 g/dL (ref 30.0–36.0)
MCV: 101.1 fL — ABNORMAL HIGH (ref 80.0–100.0)
Platelets: 92 10*3/uL — ABNORMAL LOW (ref 150–400)
RBC: 2.83 MIL/uL — ABNORMAL LOW (ref 3.87–5.11)
RDW: 14.6 % (ref 11.5–15.5)
WBC: 4.8 K/uL (ref 4.0–10.5)
nRBC: 0 % (ref 0.0–0.2)

## 2017-11-07 LAB — RENAL FUNCTION PANEL
Albumin: 3.2 g/dL — ABNORMAL LOW (ref 3.5–5.0)
Anion gap: 8 (ref 5–15)
BUN: 26 mg/dL — ABNORMAL HIGH (ref 8–23)
CO2: 29 mmol/L (ref 22–32)
Calcium: 8.9 mg/dL (ref 8.9–10.3)
Chloride: 105 mmol/L (ref 98–111)
Creatinine, Ser: 1.45 mg/dL — ABNORMAL HIGH (ref 0.44–1.00)
GFR calc Af Amer: 39 mL/min — ABNORMAL LOW (ref >60)
GFR calc non Af Amer: 34 mL/min — ABNORMAL LOW (ref >60)
Glucose, Bld: 105 mg/dL — ABNORMAL HIGH (ref 70–99)
Phosphorus: 4.2 mg/dL (ref 2.5–4.6)
Potassium: 4.3 mmol/L (ref 3.5–5.1)
Sodium: 142 mmol/L (ref 135–145)

## 2017-11-07 NOTE — Evaluation (Signed)
Occupational Therapy Evaluation Patient Details Name: [name] MRN: [id number] DOB: May 02, 1940 Today's Date: 11/07/2017    History of Present Illness Pt is a 77 y/o female admitted following syncopal episode. Found to have R subdural hematoma and trace L subdural hematoma. Pt also with questionable R femoral neck fx, however, per RN, likely not working up. PMH includes HTN, DM, CHF, asthma, DVT, COPD, and CKD.    Clinical Impression   PTA patient independent.  Pt currently admitted for above and limited by decreased activity tolerance, impaired balance, generalized weakness, and safety. She currently requires supervision for self care tasks, seated/standing, verbal cueing for safety awareness. Educated on safety, precautions, fall prevention strategies, and recommendations. Patient reports she will have support of daughter initially, but typically lives alone.  Will continue to follow while admitted and HHOT at discharge in order to optimize independence with ADls and mobility.  Will continue to follow.     Follow Up Recommendations  Home health OT;Supervision/Assistance - 24 hour    Equipment Recommendations  None recommended by OT    Recommendations for Other Services       Precautions / Restrictions Precautions Precautions: Fall Restrictions Weight Bearing Restrictions: No      Mobility Bed Mobility Overal bed mobility: Needs Assistance Bed Mobility: Supine to Sit     Supine to sit: Supervision     General bed mobility comments: supervision for safety, increased time required  Transfers Overall transfer level: Needs assistance Equipment used: Rolling walker (2 wheeled) Transfers: Sit to/from Stand Sit to Stand: Supervision         General transfer comment: Supervision and increased time to perform transfer. Verbal cues for safe hand placement.      Balance Overall balance assessment: Needs assistance Sitting-balance support: No upper extremity  supported;Feet supported Sitting balance-Leahy Scale: Fair     Standing balance support: No upper extremity supported;During functional activity Standing balance-Leahy Scale: Fair Standing balance comment: static standing ADLs with close supervision                           ADL either performed or assessed with clinical judgement   ADL Overall ADL's : Needs assistance/impaired     Grooming: Supervision/safety;Standing   Upper Body Bathing: Set up;Sitting   Lower Body Bathing: Set up;Supervison/ safety;Sit to/from stand   Upper Body Dressing : Set up;Sitting   Lower Body Dressing: Supervision/safety;Set up;Sit to/from stand   Toilet Transfer: Supervision/safety;Regular Toilet;Grab bars;RW;Ambulation   Toileting- Water quality scientist and Hygiene: Supervision/safety;Sit to/from stand       Functional mobility during ADLs: Supervision/safety;Rolling walker General ADL Comments: Pt fatigues easily, oxgyen off when therapist enters room with noted SOB upon exertion RA oxgyen saturations 88% and replaced oxgyen at 2L      Vision Baseline Vision/History: Wears glasses Wears Glasses: At all times Patient Visual Report: No change from baseline Vision Assessment?: Yes Eye Alignment: Within Functional Limits Ocular Range of Motion: Within Functional Limits Alignment/Gaze Preference: Within Defined Limits Tracking/Visual Pursuits: Able to track stimulus in all quads without difficulty     Perception     Praxis      Pertinent Vitals/Pain Pain Assessment: Faces Faces Pain Scale: No hurt     Hand Dominance Right   Extremity/Trunk Assessment Upper Extremity Assessment Upper Extremity Assessment: Generalized weakness   Lower Extremity Assessment Lower Extremity Assessment: Defer to PT evaluation       Communication Communication Communication: No difficulties   Cognition  Arousal/Alertness: Awake/alert Behavior During Therapy: WFL for tasks  assessed/performed Overall Cognitive Status: Within Functional Limits for tasks assessed                                     General Comments  daughter present, plans to provide 24/7 support initally     Exercises     Shoulder Instructions      Home Living Family/patient expects to be discharged to:: Private residence Living Arrangements: Alone Available Help at Discharge: Family;Available 24 hours/day Type of Home: House Home Access: Ramped entrance     Home Layout: One level     Bathroom Shower/Tub: Occupational psychologist: Handicapped height     Home Equipment: Environmental consultant - 4 wheels;Cane - single point;Shower seat - built in          Prior Functioning/Environment Level of Independence: Independent                 OT Problem List: Decreased activity tolerance;Impaired balance (sitting and/or standing);Decreased safety awareness;Decreased knowledge of use of DME or AE;Decreased knowledge of precautions;Decreased strength      OT Treatment/Interventions: Self-care/ADL training;Therapeutic exercise;Energy conservation;Balance training;Patient/family education;Therapeutic activities;DME and/or AE instruction    OT Goals(Current goals can be found in the care plan section) Acute Rehab OT Goals Patient Stated Goal: to go home  OT Goal Formulation: With patient Time For Goal Achievement: 11/14/17 Potential to Achieve Goals: Good  OT Frequency: Min 2X/week   Barriers to D/C:            Co-evaluation              AM-PAC PT "6 Clicks" Daily Activity     Outcome Measure Help from another person eating meals?: None Help from another person taking care of personal grooming?: None Help from another person toileting, which includes using toliet, bedpan, or urinal?: None Help from another person bathing (including washing, rinsing, drying)?: None Help from another person to put on and taking off regular upper body clothing?: None Help  from another person to put on and taking off regular lower body clothing?: None 6 Click Score: 24   End of Session Equipment Utilized During Treatment: Gait belt;Oxygen;Rolling walker Nurse Communication: Mobility status  Activity Tolerance: Patient tolerated treatment well Patient left: with call bell/phone within reach;with nursing/sitter in room;with family/visitor present;Other (comment)(seated EOB)  OT Visit Diagnosis: Unsteadiness on feet (R26.81);Muscle weakness (generalized) (M62.81)                Time: 6226-3335 OT Time Calculation (min): 20 min Charges:  OT General Charges $OT Visit: 1 Visit OT Evaluation $OT Eval Low Complexity: [place], OT Acute Rehabilitation Services Pager [phone] Office [phone]   Delight Stare 11/07/2017, 12:54 PM

## 2017-11-07 NOTE — Discharge Instructions (Signed)
Intracranial Hemorrhage  An intracranial hemorrhage is bleeding in the layers between the skull (cranium) and brain. A blood vessel bursts and allows blood to leak inside the cranial cavity. The leaking blood then collects (hematoma). This causes pressure and damage to brain cells. The bleeding can be mild to severe. In severe cases, it can lead to permanent damage or death. Symptoms may come on suddenly or develop over time. Early diagnosis and treatment leads to better recovery.  There are four types of intracranial hemorrhage: subarachnoid, subdural, extradural, or cerebral hemorrhage.  What are the causes?   Head injury (trauma).   Ruptured brain aneurysm.   Bleeding from blood vessels that develop abnormally (arteriovenous malformation).   Bleeding disorder.   Use of blood thinners (anticoagulants).   Use of certain drugs, such as cocaine.  For some people with intracranial hemorrhage, the cause is unknown.  What increases the risk?   Using tobacco products, such as cigarettes and chewing tobacco.   Having high blood pressure (hypertension).   Abusing alcohol.   Being a female, especially of postmenopausal age.   Having a family history of disease in the blood vessels of the brain (cerebrovascular disease).   Having certain genetic syndromes that result in kidney disease or connective tissue disease.  What are the signs or symptoms?   A sudden, severe headache with no known cause. The headache is often described as the worst headache ever experienced.   Nausea or vomiting, especially when combined with other symptoms such as a headache.   Sudden weakness or numbness of the face, arm, or leg, especially on one side of the body.   Sudden trouble walking or difficulty moving arms or legs.   Sudden confusion.   Sudden personality changes.   Trouble speaking (aphasia) or understanding.   Difficulty swallowing.   Sudden trouble seeing in one or both eyes.   Double vision.   Dizziness.   Loss  of balance or coordination.   Intolerance to light.   Stiff neck.  How is this diagnosed?  Your health care provider will perform a physical exam and ask about your symptoms. If an intracranial hemorrhage is suspected, various tests may be ordered. These tests may include:   A CT scan.   An MRI.   A cerebral angiogram.   A spinal tap (lumbar puncture).   Blood tests.    How is this treated?  Immediate treatment in the hospital is often required to reduce the risk of brain damage. Treatment will depend on the cause of the bleeding, where it is located, and the extent of the bleeding and damage. The goals of treatment include stopping the bleeding, repairing the cause of bleeding, providing relief of symptoms, and preventing problems.   Medicines may be given to:  ? Lower blood pressure (antihypertensives).  ? Relieve pain (analgesics).  ? Relieve nausea or vomiting.   Surgery may be needed to stop the bleeding, repair the cause of the bleeding, or remove the blood.   Rehabilitation may be needed to improve any cognitive and day-to-day functions impaired by the condition.    Further treatment depends on the duration, severity, and cause of your symptoms. Physical, speech, and occupational therapists will assess you and work to improve any functions impaired by the intracranial hemorrhage. Measures will be taken to prevent short-term and long-term problems, including infection from breathing foreign material into the lungs (aspiration pneumonia), blood clots in the legs, bedsores, and falls.  Follow these instructions at home:     Take medicines only as directed by your health care provider.   Eat healthy foods as directed by your health care provider:  ? A diet low in salt (sodium), saturated fat, trans fat, and cholesterol may be recommended to manage your blood pressure.  ? Foods may need to be soft or pureed, or small bites may need to be taken in order to avoid aspirating or choking.  ? If studies show  that your ability to swallow safely has been affected, you may need to seek help from specialists such as a dietitian, speech and language pathologist, or an occupational therapist. These health care providers can teach you how to safely get the nutrition your body needs.   Rest and limit activities or movements as directed by your health care provider.   Do not use any tobacco products including cigarettes, chewing tobacco, or electronic cigarettes. If you need help quitting, ask your health care provider.   Limit alcohol intake to no more than 1 drink per day for nonpregnant women and 2 drinks per day for men. One drink equals 12 ounces of beer, 5 ounces of wine, or 1 ounces of hard liquor.   Make any other lifestyle changes as directed by your health care provider.   Monitor and record your blood pressure as directed by your health care provider.   A safe home environment is important to reduce the risk of falls. Your health care provider may arrange for specialists to evaluate your home. Having grab bars in the bedroom and bathroom is often important. Your health care provider may arrange for special equipment to be used at home, such as raised toilets and a seat for the shower.   Do physical, occupational, and speech therapy as directed by your health care provider. Ongoing therapy may be needed to maximize your recovery.   Use a walker or a cane at all times if directed by your health care provider.   Keep all follow-up visits with your health care provider and other specialists. This is important. This includes any referrals, physical therapy, and rehabilitation.  Get help right away if:   You have a sudden, severe headache with no known cause.   You have nausea or vomiting occurring with another symptom.   You have sudden weakness or numbness of the face, arm, or leg, especially on one side of the body.   You have sudden trouble walking or difficulty moving your arms or legs.   You have sudden  confusion.   You have trouble speaking (aphasia) or understanding.   You have sudden trouble seeing in one or both eyes.   You have a sudden loss of balance or coordination.   You have a stiff neck.   You have difficulty breathing.   You have a partial or total loss of consciousness.  These symptoms may represent a serious problem that is an emergency. Do not wait to see if the symptoms will go away. Get medical help right away. Call your local emergency services (911 in the [place] Do not drive yourself to the hospital.  This information is not intended to replace advice given to you by your health care provider. Make sure you discuss any questions you have with your health care provider.  Document Released: 08/03/2013 Document Revised: 06/08/2015 Document Reviewed: 03/02/2013  [employer] Interactive Patient Education  2018 [employer]

## 2017-11-07 NOTE — Progress Notes (Signed)
Patient for dc home, with [facility] for HHPT , she already has a rolling walker at home.  NCM notified [place] with [facility].

## 2017-11-07 NOTE — Discharge Summary (Signed)
Physician Discharge Summary  [name] GYI:[id number] DOB: 1940/06/06 DOA: 11/04/2017  PCP: Mosie Lukes, MD  Admit date: 11/04/2017 Discharge date: 11/07/2017  Admitted From: Home Disposition:  Home  Recommendations for Outpatient Follow-up:  1. Follow up with PCP in 1-2 weeks 2. Please obtain BMP/CBC in one week 3. Please do not take your aspirin anymore. 4. You will be contacted by our neuro interventional radiologists as an outpatient for your aneurysm.  Home Health: Yes, home health PT Equipment/Devices: Rolling walker with 5 inch wheels  Discharge Condition: Stable CODE STATUS: Full Diet recommendation: Heart Healthy    Brief/Interim Summary:  #) Syncope due to subarachnoid hemorrhage complicated by subdural hematoma: Patient apparently had a syncopal episode with no prodrome.   On admission she had a CT of the head that showed a subarachnoid hemorrhage from an a comm aneurysm that was noted on an MRA. She was monitored on telemetry here and did not have any arrhythmias.  Her echo was unremarkable.  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  This fall then caused a subdural hematoma that was stable.  Patient was seen by neurosurgery who did not recommend any intervention.  Patient was seen by neuro interventional radiology who recommended outpatient follow-up in 2 to 4 weeks for this aneurysm.  Patient's aspirin was discontinued on the recommendation of neurosurgery.  #) Right hip pain: Patient's admission x-ray showed some irregularity of the hip.  Patient was noted to have some pain with physical therapy.  A CT of that hip showed evidence of muscle strain but no fracture.  #) COPD: Patient was continued on home ICS/LABA, PRN bronchodilators, PRN montelukast.  #) Gout: Patient was continued on home allopurinol.  #) Hypertension/hyperlipidemia: Patient was continued on home  metoprolol succinate, pravastatin.  She was told to discontinue her aspirin.  #) Pain/psych: Patient was continued on home sertraline.  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)   Chronic combined systolic and diastolic CHF (congestive heart failure) (HCC)   Thrombocytopenia (HCC)   Syncope and collapse   Subarachnoid hemorrhage [facility])    Discharge Instructions  Discharge Instructions    Call MD for:  difficulty breathing, headache or visual disturbances   Complete by:  As directed    Call MD for:  extreme fatigue   Complete by:  As directed    Call MD for:  hives   Complete by:  As directed    Call MD for:  persistant dizziness or light-headedness   Complete by:  As directed    Call MD for:  persistant nausea and vomiting   Complete by:  As directed    Call MD for:  redness, tenderness, or signs of infection (pain, swelling, redness, odor or green/yellow discharge around incision site)   Complete by:  As directed    Call MD for:  severe uncontrolled pain   Complete by:  As directed    Call MD for:  temperature >100.4   Complete by:  As directed    Diet - low sodium heart healthy   Complete by:  As directed    Discharge instructions   Complete by:  As directed    Please follow-up with your primary care doctor in 1 week.  He will be contacted  by our interventional radiologist in 2 to 4 weeks to set up an appointment for your aneurysm.  Please do not take your aspirin anymore.   Increase activity slowly   Complete by:  As directed      Allergies as of 11/07/2017      Reactions   Oseltamivir Phosphate    TAMIFLU REACTION: nausea, vomiting, diarrhea, dizziness   Sulfa Antibiotics Other (See Comments)   CP   Ace Inhibitors    Indomethacin    Montelukast Sodium    REACTION: Heart palpitations, chest  pain   Sulfonamide Derivatives       Medication List    STOP taking these medications   aspirin EC 81 MG tablet     TAKE these medications   acetaminophen 500 MG tablet Commonly known as:  TYLENOL Take 500 mg by mouth 2 (two) times daily.   albuterol 0.63 MG/3ML nebulizer solution Commonly known as:  ACCUNEB Take 3 mLs (0.63 mg total) by nebulization every 6 (six) hours as needed for wheezing or shortness of breath.   albuterol 108 (90 Base) MCG/ACT inhaler Commonly known as:  PROVENTIL HFA;VENTOLIN HFA INHALE 2 PUFFS INTO THE LUNGS 4 (FOUR) TIMES DAILY AS NEEDED FOR WHEEZING OR SHORTNESS OF BREATH.   allopurinol 100 MG tablet Commonly known as:  ZYLOPRIM TAKE 2 TABLETS ONE TIME DAILY What changed:  See the new instructions.   b complex vitamins tablet Take 1 tablet by mouth daily.   budesonide-formoterol 160-4.5 MCG/ACT inhaler Commonly known as:  SYMBICORT INHALE 2 PUFFS INTO THE LUNGS 2 TIMES DAILY What changed:    how much to take  how to take this  when to take this  additional instructions   cholecalciferol 1000 units tablet Commonly known as:  VITAMIN D Take 1,000 Units daily by mouth.   Ferrous Fumarate-Folic Acid 559-7 MG Tabs Take 1 tablet by mouth daily.   fluticasone 50 MCG/ACT nasal spray Commonly known as:  FLONASE Place 1 spray into both nostrils daily. What changed:    when to take this  reasons to take this   furosemide 40 MG tablet Commonly known as:  LASIX TAKE 1 TABLET BY MOUTH DAILY AND A 2ND TABLET DAILY AS NEEDED FOR INCREASED EDEMA OR WEIGHT GAIN >3 LBS/24 HOUR   loratadine 10 MG tablet Commonly known as:  CLARITIN Take 1 tablet (10 mg total) by mouth at bedtime. What changed:    when to take this  reasons to take this   metoprolol succinate 50 MG 24 hr tablet Commonly known as:  TOPROL-XL TAKE 1 AND 1/2 TABLETS EVERY DAY What changed:    how much to take  how to take this  when to take this  additional  instructions   montelukast 10 MG tablet Commonly known as:  SINGULAIR Take 1 tablet (10 mg total) by mouth at bedtime as needed.   ONE TOUCH TEST STRIPS test strip Generic drug:  glucose blood Use as instructed to check blood sugar once weekly. DX 790.29   OXYGEN Inhale 2 L into the lungs continuous.   potassium chloride SA 20 MEQ tablet Commonly known as:  K-DUR,KLOR-CON Take 1 tablet (20 mEq total) by mouth daily. And a 2nd tab po daily prn extra Furosemide tab What changed:  when to take this   pravastatin 10 MG tablet Commonly known as:  PRAVACHOL TAKE 1 TABLET BY MOUTH ONCE DAILY   PROBIOTIC PO Take 1 tablet by mouth daily.   ranitidine 150 MG tablet  Commonly known as:  ZANTAC TAKE 1 TABLET TWICE DAILY   sertraline 100 MG tablet Commonly known as:  ZOLOFT Take 1 tablet (100 mg total) by mouth daily.   traMADol 50 MG tablet Commonly known as:  ULTRAM TAKE 1 TABLET BY MOUTH THREE TIMES DAILY AS NEEDED What changed:    when to take this  reasons to take this   trolamine salicylate 10 % cream Commonly known as:  ASPERCREME Apply 1 application topically as needed for muscle pain.   vitamin B-12 1000 MCG tablet Commonly known as:  CYANOCOBALAMIN Take 1,000 mcg by mouth daily.            Durable Medical Equipment  (From admission, onward)         Start     Ordered   11/07/17 1041  For home use only DME Walker rolling  [facility])  Once    Question:  Patient needs a walker to treat with the following condition  Answer:  Gait instability   11/07/17 1041         Follow-up Information    Deveshwar, Willaim Rayas, MD Follow up in 4 week(s).   Specialties:  Interventional Radiology, Radiology Why:  Please plan for consult with Dr. Estanislado Pandy in clinic 2-4 weeks after discharge. Contact information: [address] [place] [address] [phone]          Allergies  Allergen Reactions  . Oseltamivir Phosphate     TAMIFLU REACTION: nausea, vomiting,  diarrhea, dizziness  . Sulfa Antibiotics Other (See Comments)    CP  . Ace Inhibitors   . Indomethacin   . Montelukast Sodium     REACTION: Heart palpitations, chest pain  . Sulfonamide Derivatives     Consultations:  Neurosurgery  Neuro interventional radiology   Procedures/Studies: Dg Chest 2 View  Result Date: 11/04/2017 CLINICAL DATA:  Cough.  Shortness of breath.  Fall. EXAM: CHEST - 2 VIEW COMPARISON:  04/16/2016 FINDINGS: There is slight peribronchial thickening which is chronic or recurrent. Overall heart size and pulmonary vascularity are normal. Chronic slight elevation of the left hemidiaphragm. No acute bone abnormalities. IMPRESSION: Slight bronchitic changes which could be recurrent or chronic. Electronically Signed   By: Lorriane Shire M.D.   On: 11/04/2017 12:49   Ct Head Wo Contrast  Result Date: 11/05/2017 CLINICAL DATA:  Continued surveillance subarachnoid hemorrhage. [place] a couple of nights ago. No loss of consciousness. EXAM: CT HEAD WITHOUT CONTRAST TECHNIQUE: Contiguous axial images were obtained from the base of the skull through the vertex without intravenous contrast. COMPARISON:  11/04/2017 CT head. FINDINGS: Brain: Slight involution of LEFT frontal subarachnoid hemorrhage noted on the previous exam. No parenchymal hemorrhage. Not previously reported, but present nevertheless, is an acute subdural hematoma on the RIGHT, along the convexity extending down into the middle cranial fossa, no significant midline shift, up to 5 mm thickness, slight involution compared with yesterday's scan. See series 5, image 45. Slight to minimal redistribution of subdural blood along the RIGHT tentorium, no more than 1-2 mm thickness. Vascular: Calcification of the cavernous internal carotid arteries consistent with cerebrovascular atherosclerotic disease. No signs of intracranial large vessel occlusion. Skull: Calvarium intact.  RIGHT frontal scalp hematoma. Sinuses/Orbits: Clear  sinuses.  BILATERAL cataract extraction. Other: No middle ear or mastoid fluid. IMPRESSION: Slight involution of previously unreported acute RIGHT subdural hematoma, up to 5 mm thickness without significant midline shift. Slight involution of previously reported LEFT frontal subarachnoid hemorrhage. No new areas of parenchymal hemorrhage or delayed  intracerebral hematoma. No skull fracture.  RIGHT frontal scalp hematoma. Electronically Signed   By: Staci Righter M.D.   On: 11/05/2017 08:40   Ct Head Wo Contrast  Result Date: 11/04/2017 CLINICAL DATA:  Posttraumatic headache and neck pain after fall. EXAM: CT HEAD WITHOUT CONTRAST CT CERVICAL SPINE WITHOUT CONTRAST TECHNIQUE: Multidetector CT imaging of the head and cervical spine was performed following the standard protocol without intravenous contrast. Multiplanar CT image reconstructions of the cervical spine were also generated. COMPARISON:  CT scan of October 22, 2016. FINDINGS: CT HEAD FINDINGS Brain: Small amount of subarachnoid hemorrhage is noted in the left frontal lobe. No mass effect or midline shift is noted. Ventricular size is within normal limits. No acute infarction or mass lesion is noted. Vascular: No hyperdense vessel or unexpected calcification. Skull: Normal. Negative for fracture or focal lesion. Sinuses/Orbits: No acute finding. Other: Small right frontal scalp hematoma is noted. CT CERVICAL SPINE FINDINGS Alignment: Minimal grade 1 anterolisthesis of C5-6 is noted secondary to posterior facet joint hypertrophy. Skull base and vertebrae: No acute fracture. No primary bone lesion or focal pathologic process. Soft tissues and spinal canal: No prevertebral fluid or swelling. No visible canal hematoma. Disc levels: Severe degenerative disc disease is noted extending from C3-4 to C6-7. Upper chest: Negative. Other: None. IMPRESSION: Small focus of subarachnoid hemorrhage seen in left frontal lobe. Critical Value/emergent results were called by  telephone at the time of interpretation on 11/04/2017 at 1:06 pm to Dr. Julianne Rice , who verbally acknowledged these results. Severe multilevel degenerative disc disease. No acute abnormality seen in the cervical spine. Electronically Signed   By: Marijo Conception, M.D.   On: 11/04/2017 13:06   Ct Cervical Spine Wo Contrast  Result Date: 11/04/2017 CLINICAL DATA:  Posttraumatic headache and neck pain after fall. EXAM: CT HEAD WITHOUT CONTRAST CT CERVICAL SPINE WITHOUT CONTRAST TECHNIQUE: Multidetector CT imaging of the head and cervical spine was performed following the standard protocol without intravenous contrast. Multiplanar CT image reconstructions of the cervical spine were also generated. COMPARISON:  CT scan of October 22, 2016. FINDINGS: CT HEAD FINDINGS Brain: Small amount of subarachnoid hemorrhage is noted in the left frontal lobe. No mass effect or midline shift is noted. Ventricular size is within normal limits. No acute infarction or mass lesion is noted. Vascular: No hyperdense vessel or unexpected calcification. Skull: Normal. Negative for fracture or focal lesion. Sinuses/Orbits: No acute finding. Other: Small right frontal scalp hematoma is noted. CT CERVICAL SPINE FINDINGS Alignment: Minimal grade 1 anterolisthesis of C5-6 is noted secondary to posterior facet joint hypertrophy. Skull base and vertebrae: No acute fracture. No primary bone lesion or focal pathologic process. Soft tissues and spinal canal: No prevertebral fluid or swelling. No visible canal hematoma. Disc levels: Severe degenerative disc disease is noted extending from C3-4 to C6-7. Upper chest: Negative. Other: None. IMPRESSION: Small focus of subarachnoid hemorrhage seen in left frontal lobe. Critical Value/emergent results were called by telephone at the time of interpretation on 11/04/2017 at 1:06 pm to Dr. Julianne Rice , who verbally acknowledged these results. Severe multilevel degenerative disc disease. No acute  abnormality seen in the cervical spine. Electronically Signed   By: Marijo Conception, M.D.   On: 11/04/2017 13:06   Mr Jodene Nam Head Wo Contrast  Result Date: 11/05/2017 CLINICAL DATA:  77 year old female status post fall with small right side subdural hematoma, small volume left frontal convexity subarachnoid hemorrhage. Pupil size asymmetry. EXAM: MRI HEAD WITHOUT CONTRAST MRA  HEAD WITHOUT CONTRAST TECHNIQUE: Multiplanar, multiecho pulse sequences of the brain and surrounding structures were obtained without intravenous contrast. Angiographic images of the head were obtained using MRA technique without contrast. COMPARISON:  Head CT 0803 hours today and earlier. FINDINGS: MRI HEAD FINDINGS Brain: Trace subarachnoid hemorrhage evident at the left operculum. There might also be trace subarachnoid blood over the left parietal convexity seen as a small area of diffusion abnormality on series 7, image 37. There are small bilateral subdural hematomas. The right side is more extensive and measures between 2 and 5 millimeters in thickness. The left side subdural is primarily along the posterior convexity and measures 1-2 millimeters in thickness. See series 15, image 14. No midline shift. No intraventricular hemorrhage or other extra-axial blood. No ventriculomegaly. No restricted diffusion convincing for acute infarct. No encephalomalacia or chronic cerebral blood products identified. Cervicomedullary junction and pituitary are within normal limits. Vascular: Major intracranial vascular flow voids are preserved. Skull and upper cervical spine: Degenerative ligamentous hypertrophy about the odontoid results in mild cervicomedullary junction stenosis. Advanced degenerative changes in the cervical spine at C3-C4 and C4-C5 with at least mild degenerative spinal stenosis on series 13 image 11. Sinuses/Orbits: Negative orbits, postoperative changes to both globes. Paranasal sinuses and mastoids are stable and well pneumatized.  Other: Visible internal auditory structures appear normal. Right superior convexity mild scalp hematoma. MRA HEAD FINDINGS Antegrade flow in the posterior circulation. Codominant distal vertebral arteries with irregularity compatible with atherosclerosis but no significant stenosis. Patent PICA origins and vertebrobasilar junction. Basilar artery irregularity without stenosis. SCA and PCA origins are patent without stenosis. Both posterior communicating arteries are present. Bilateral PCA branches are within normal limits. Antegrade flow in both ICA siphons. Siphon irregularity greater on the left. No significant siphon stenosis. Ophthalmic and posterior communicating artery origins are normal. Patent carotid termini. Normal MCA and ACA origins. There is ectasia of the anterior communicating artery versus a small 2-3 millimeter aneurysm as seen on series 9, image 136. Visible ACA branches are within normal limits. MCA M1 segments, MCA bifurcations and visible MCA branches are patent without stenosis. IMPRESSION: MRI: 1. There are small Bilateral Subdural Hematomas, larger on the right: 5 mm right and 1-2 mm left SDH with no midline shift or significant mass effect. 2. Trace left hemisphere subarachnoid hemorrhage. 3. No other acute intracranial abnormality. Mild left scalp hematoma. 4. Advanced cervical spine degeneration. 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 MRA: 1. Small Aneurysm versus ectasia of the Anterior Communicating Artery, 2-3 mm. In light of the small lesion size, surveillance MRA (such as annual or biennial) might be preferred although Neuro-Interventional Radiology can be consulted to evaluate the appropriateness of potential treatment. Non-emergent evaluation can be arranged by calling [phone] during usual hours. Emergency evaluation can be requested by paging [phone]. 2. Intracranial atherosclerosis without significant arterial stenosis.  Electronically Signed   By: Genevie Ann M.D.   On: 11/05/2017 14:39   Mr Brain Wo Contrast  Result Date: 11/05/2017 CLINICAL DATA:  77 year old female status post fall with small right side subdural hematoma, small volume left frontal convexity subarachnoid hemorrhage. Pupil size asymmetry. EXAM: MRI HEAD WITHOUT CONTRAST MRA HEAD WITHOUT CONTRAST TECHNIQUE: Multiplanar, multiecho pulse sequences of the brain and surrounding structures were obtained without intravenous contrast. Angiographic images of the head were obtained using MRA technique without contrast. COMPARISON:  Head CT 0803 hours today and earlier. FINDINGS: MRI HEAD FINDINGS Brain: Trace subarachnoid hemorrhage evident at the left operculum. There might  also be trace subarachnoid blood over the left parietal convexity seen as a small area of diffusion abnormality on series 7, image 37. There are small bilateral subdural hematomas. The right side is more extensive and measures between 2 and 5 millimeters in thickness. The left side subdural is primarily along the posterior convexity and measures 1-2 millimeters in thickness. See series 15, image 14. No midline shift. No intraventricular hemorrhage or other extra-axial blood. No ventriculomegaly. No restricted diffusion convincing for acute infarct. No encephalomalacia or chronic cerebral blood products identified. Cervicomedullary junction and pituitary are within normal limits. Vascular: Major intracranial vascular flow voids are preserved. Skull and upper cervical spine: Degenerative ligamentous hypertrophy about the odontoid results in mild cervicomedullary junction stenosis. Advanced degenerative changes in the cervical spine at C3-C4 and C4-C5 with at least mild degenerative spinal stenosis on series 13 image 11. Sinuses/Orbits: Negative orbits, postoperative changes to both globes. Paranasal sinuses and mastoids are stable and well pneumatized. Other: Visible internal auditory structures appear  normal. Right superior convexity mild scalp hematoma. MRA HEAD FINDINGS Antegrade flow in the posterior circulation. Codominant distal vertebral arteries with irregularity compatible with atherosclerosis but no significant stenosis. Patent PICA origins and vertebrobasilar junction. Basilar artery irregularity without stenosis. SCA and PCA origins are patent without stenosis. Both posterior communicating arteries are present. Bilateral PCA branches are within normal limits. Antegrade flow in both ICA siphons. Siphon irregularity greater on the left. No significant siphon stenosis. Ophthalmic and posterior communicating artery origins are normal. Patent carotid termini. Normal MCA and ACA origins. There is ectasia of the anterior communicating artery versus a small 2-3 millimeter aneurysm as seen on series 9, image 136. Visible ACA branches are within normal limits. MCA M1 segments, MCA bifurcations and visible MCA branches are patent without stenosis. IMPRESSION: MRI: 1. There are small Bilateral Subdural Hematomas, larger on the right: 5 mm right and 1-2 mm left SDH with no midline shift or significant mass effect. 2. Trace left hemisphere subarachnoid hemorrhage. 3. No other acute intracranial abnormality. Mild left scalp hematoma. 4. Advanced cervical spine degeneration. 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 MRA: 1. Small Aneurysm versus ectasia of the Anterior Communicating Artery, 2-3 mm. In light of the small lesion size, surveillance MRA (such as annual or biennial) might be preferred although Neuro-Interventional Radiology can be consulted to evaluate the appropriateness of potential treatment. Non-emergent evaluation can be arranged by calling [phone] during usual hours. Emergency evaluation can be requested by paging [phone]. 2. Intracranial atherosclerosis without significant arterial stenosis. Electronically Signed   By: Genevie Ann M.D.   On: 11/05/2017  14:39   Ct Hip Right Wo Contrast  Result Date: 11/06/2017 CLINICAL DATA:  Right hip pain after fall. EXAM: CT OF THE RIGHT HIP WITHOUT CONTRAST TECHNIQUE: Multidetector CT imaging of the right hip was performed according to the standard protocol. Multiplanar CT image reconstructions were also generated. COMPARISON:  CXR 11/04/2017 FINDINGS: Bones/Joint/Cartilage The subtle lucency seen at the base of the right femoral neck on prior radiographs is felt secondary to a nutrient vascular foramen. No acute displaced fracture or joint effusion is noted. Osteoarthritis of the right hip with spurring about the acetabular roof. Mild enthesopathy is also noted off greater trochanter. Ligaments Suboptimally assessed by CT. Muscles and Tendons There is mild edema along the course of the iliopsoas, series 3/33 through 65 suspicious for muscle strain. No intramuscular hemorrhage. No atrophy. Soft tissues Mild soft tissue contusion along the lateral aspect right hip. IMPRESSION:  1. Osteoarthritis of the right hip without acute fracture or joint dislocation. No effusion. 2. Slightly edematous appearance of the right iliopsoas suspicious for muscle strain. 3. Enthesopathy at the gluteal muscle attachment on the greater trochanter. Electronically Signed   By: Ashley Royalty M.D.   On: 11/06/2017 21:41   Dg Hip Unilat W Or Wo Pelvis 2-3 Views Right  Result Date: 11/04/2017 CLINICAL DATA:  Fall.  Right hip pain. EXAM: DG HIP (WITH OR WITHOUT PELVIS) 2-3V RIGHT COMPARISON:  04/16/2016 pelvic and right hip radiograph FINDINGS: There is slight cortical irregularity in the superior margin of right femoral neck on the pelvic view, suspicious for nondisplaced fracture. No additional potential fracture. No pelvic diastasis. No right hip dislocation. 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 Prominent degenerative changes in the visualized lower lumbar spine. No suspicious focal osseous lesions. IMPRESSION: Slight cortical irregularity in  the superior right femoral neck on the pelvic view, suspicious for nondisplaced fracture. Recommend right hip CT for further evaluation. Electronically Signed   By: Ilona Sorrel M.D.   On: 11/04/2017 12:51    Echo 11/05/2016:Study Conclusions  - Left ventricle: The cavity size was normal. Wall thickness was normal. Systolic function was normal. The estimated ejection fraction was in the range of 55% to 60%. Although no diagnostic regional wall motion abnormality was identified, this possibility cannot be completely excluded on the basis of this study. Doppler parameters are consistent with abnormal left ventricular relaxation (grade 1 diastolic dysfunction). - Aortic valve: There was no stenosis. - Mitral valve: Mildly to moderately calcified annulus. There was trivial regurgitation. - Left atrium: The atrium was mildly dilated. - Right ventricle: The cavity size was normal. Systolic function was normal. - Pulmonary arteries: No complete TR doppler jet so unable to estimate PA systolic pressure. - Inferior vena cava: The vessel was normal in size. The respirophasic diameter changes were in the normal range (>= 50%), consistent with normal central venous pressure.  Impressions:  - Normal LV size with EF 55-60%. Normal RV size and systolic  function. No significant valvular abnormalities   Subjective:   Discharge Exam: Vitals:   11/07/17 0804 11/07/17 0914  BP:  114/60  Pulse:  76  Resp:  17  Temp:  99.1 F (37.3 C)  SpO2: 98% 95%   Vitals:   11/06/17 2341 11/07/17 0355 11/07/17 0804 11/07/17 0914  BP: (!) 117/52 125/61  114/60  Pulse: 76 66  76  Resp: 20 20  17   Temp: 99 F (37.2 C) 98.4 F (36.9 C)  99.1 F (37.3 C)  TempSrc: Oral Oral  Oral  SpO2: 99% 100% 98% 95%   General exam: Appears calm and comfortable  Respiratory system: Clear to auscultation. Respiratory effort normal. Cardiovascular system: Regular rate and rhythm, no  murmurs. Gastrointestinal system: Abdomen is nondistended, soft and nontender. No organomegaly or masses felt. Normal bowel sounds heard. Central nervous system: Alert and oriented. No focal neurological deficits. Extremities: Trace lower extremity edema, no pain on internal or external rotation of the hip. Skin: Contusions noted over the head Psychiatry: Judgement and insight appear normal. Mood & affect appropriate.      The results of significant diagnostics from this hospitalization (including imaging, microbiology, ancillary and laboratory) are listed below for reference.     Microbiology: No results found for this or any previous visit (from the past 240 hour(s)).   Labs: BNP (last 3 results) No results for input(s): BNP in the last 8760 hours. Basic Metabolic Panel: Recent  Labs  Lab 11/04/17 1154 11/05/17 0331 11/06/17 0422 11/07/17 0452  NA 139 140 140 142  K 5.2* 3.9 4.3 4.3  CL 105 104 107 105  CO2 28 29 27 29   GLUCOSE 95 112* 110* 105*  BUN 29* 26* 26* 26*  CREATININE 1.62* 1.49* 1.33* 1.45*  CALCIUM 9.6 9.0 9.2 8.9  PHOS  --   --   --  4.2   Liver Function Tests: Recent Labs  Lab 11/04/17 1154 11/07/17 0452  AST 21  --   ALT 16  --   ALKPHOS 44  --   BILITOT 0.7  --   PROT 6.6  --   ALBUMIN 3.9 3.2*   No results for input(s): LIPASE, AMYLASE in the last 168 hours. No results for input(s): AMMONIA in the last 168 hours. CBC: Recent Labs  Lab 11/04/17 1154 11/05/17 0331 11/07/17 0452  WBC 6.2 5.1 4.8  NEUTROABS 4.5  --   --   HGB 10.1* 8.8* 9.0*  HCT 32.9* 29.0* 28.6*  MCV 101.5* 101.4* 101.1*  PLT 119* 89* 92*   Cardiac Enzymes: No results for input(s): CKTOTAL, CKMB, CKMBINDEX, TROPONINI in the last 168 hours. BNP: Invalid input(s): POCBNP CBG: No results for input(s): GLUCAP in the last 168 hours. D-Dimer No results for input(s): DDIMER in the last 72 hours. Hgb A1c No results for input(s): HGBA1C in the last 72 hours. Lipid  Profile No results for input(s): CHOL, HDL, LDLCALC, TRIG, CHOLHDL, LDLDIRECT in the last 72 hours. Thyroid function studies No results for input(s): TSH, T4TOTAL, T3FREE, THYROIDAB in the last 72 hours.  Invalid input(s): FREET3 Anemia work up No results for input(s): VITAMINB12, FOLATE, FERRITIN, TIBC, IRON, RETICCTPCT in the last 72 hours. Urinalysis    Component Value Date/Time   COLORURINE YELLOW 09/17/2017 [place] 09/17/2017 1357   LABSPEC 1.010 09/17/2017 1357   PHURINE 5.5 09/17/2017 1357   GLUCOSEU NEGATIVE 09/17/2017 1357   HGBUR NEGATIVE 09/17/2017 1357   [place] 09/17/2017 1357   BILIRUBINUR neg 08/23/2013 1029   KETONESUR NEGATIVE 09/17/2017 1357   PROTEINUR neg 08/23/2013 1029   PROTEINUR NEG 10/20/2012 1148   UROBILINOGEN 0.2 09/17/2017 1357   NITRITE NEGATIVE 09/17/2017 1357   LEUKOCYTESUR NEGATIVE 09/17/2017 1357   Sepsis Labs Invalid input(s): PROCALCITONIN,  WBC,  LACTICIDVEN Microbiology No results found for this or any previous visit (from the past 240 hour(s)).   Time coordinating discharge: 35  SIGNED:   Cristy Folks, MD  [facility] 11/07/2017, 10:41 AM  If 7PM-7AM, please contact night-coverage [email or web address] Password [email or web address]

## 2017-11-07 NOTE — Progress Notes (Signed)
Discharge instructions given. Pt verbalized understanding and all questions were answered.  

## 2017-11-10 ENCOUNTER — Other Ambulatory Visit (HOSPITAL_COMMUNITY): Payer: Self-pay | Admitting: Interventional Radiology

## 2017-11-10 ENCOUNTER — Telehealth: Payer: Self-pay

## 2017-11-10 DIAGNOSIS — I729 Aneurysm of unspecified site: Secondary | ICD-10-CM

## 2017-11-10 NOTE — Telephone Encounter (Signed)
Called [facility] to complete TCM. Left message for return call.

## 2017-11-11 ENCOUNTER — Telehealth: Payer: Self-pay

## 2017-11-11 NOTE — Telephone Encounter (Signed)
Copied from [place] [phone]. Topic: Quick Communication - Home Health Verbal Orders >> Nov 10, 2017  2:14 PM Judyann Munson wrote: Caller/Agency: [facility] Number: [phone]   patient has denial all services at this time. Please advise

## 2017-11-11 NOTE — Telephone Encounter (Signed)
Just an FYI

## 2017-11-13 ENCOUNTER — Other Ambulatory Visit: Payer: Self-pay | Admitting: Family Medicine

## 2017-11-16 ENCOUNTER — Inpatient Hospital Stay: Payer: Medicare HMO | Admitting: Family Medicine

## 2017-11-16 MED ORDER — FAMOTIDINE 20 MG PO TABS: 20.0000 mg | ORAL_TABLET | Freq: Every day | ORAL | 1 refills | Status: DC

## 2017-11-17 ENCOUNTER — Ambulatory Visit (HOSPITAL_COMMUNITY)
Admission: RE | Admit: 2017-11-17 | Discharge: 2017-11-17 | Disposition: A | Discharge status: Home / Self Care | Payer: Medicare HMO | Source: Ambulatory Visit | Attending: Interventional Radiology | Admitting: Interventional Radiology

## 2017-11-17 DIAGNOSIS — I729 Aneurysm of unspecified site: Secondary | ICD-10-CM

## 2017-11-17 DIAGNOSIS — I719 Aortic aneurysm of unspecified site, without rupture: Secondary | ICD-10-CM | POA: Diagnosis not present

## 2017-11-17 NOTE — Consult Note (Signed)
Chief Complaint: Patient was seen in consultation today for anterior communicating artery aneurysm  Supervising Physician: Luanne Bras  Patient Status: [name] [facility] - Out-pt  History of Present Illness: [name] is a 77 y.o. female with past medical history of arthritis, asthma, COPD, DM, hypertension who was admitted to [facility] 10/16 with syncope due to subarachnoid hemorrhage complicated by subdural hematoma.  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  She was evaluated by neurosurgery who recommended discontinuing her aspirin. She was referred to Neurointerventional Service for ongoing evaluation and management of her aneurysm.   Patient presents to [facility] clinic today in her usual state of health.  She has no residual deficits related to her strokes.  She does endorse a daily mild to moderate headache. She does have a large facial bruise which is clearly resolving after her fall. She is interested in discussing her imaging results and moving forward with any recommendations for intervention related to her aneurysm.  She is accompanied by family today although serves as her own historian.   Past Medical History:  Diagnosis Date  . Abnormal glucose tolerance test   . Abnormal TSH 09/22/2016  . ACE-inhibitor cough   . Anemia 03/12/2014  . Arthritis   . Asthma    PFT 02/06/09 FEV1 1.41 (65%), FVC 1.92 (64), FEV1% 74, TLC 3.47 (71%), DLCO 48%, +BD  . Atypical chest pain    s/p cath, Normal coronaries, Non ST elevation myocardial infarction, Rt groin pseudoaneurysm  . Bacterial vaginosis 03/12/2014  . Cellulitis 06/22/2016  . Chronic kidney disease (CKD), stage III (moderate) ([place]) 08/04/2016  . COPD (chronic obstructive pulmonary disease) ([place])   . Depression   . Diastolic heart failure ([place]) 04/07/2016  . DVT (deep venous thrombosis) ([place]) 1987  . GERD (gastroesophageal reflux disease)   . Gout   . Hypercalcemia 10/15/2014  . Hyperlipidemia   .  Hypertension   . Hypoxia 10/15/2014  . Macular degeneration 04/10/2015  . Osteopenia 12/29/2006   Qualifier: Diagnosis of  By: Wynona Luna   . Pneumonia   . 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 Renal insufficiency 09/22/2016  . Unspecified constipation 06/05/2013  . Vitamin D deficiency 01/01/2015    Past Surgical History:  Procedure Laterality Date  . APPENDECTOMY  1951  . TONSILLECTOMY  1950  . TUBAL LIGATION  1968    Allergies: Oseltamivir phosphate; Sulfa antibiotics; Ace inhibitors; Indomethacin; Montelukast sodium; and Sulfonamide derivatives  Medications: Prior to Admission medications   Medication Sig Start Date End Date Taking? Authorizing Provider  acetaminophen (TYLENOL) 500 MG tablet Take 500 mg by mouth 2 (two) times daily.     [provider]  albuterol (ACCUNEB) 0.63 MG/3ML nebulizer solution Take 3 mLs (0.63 mg total) by nebulization every 6 (six) hours as needed for wheezing or shortness of breath. 01/16/17   Mosie Lukes, MD  albuterol (PROVENTIL HFA;VENTOLIN HFA) 108 (90 Base) MCG/ACT inhaler INHALE 2 PUFFS INTO THE LUNGS 4 (FOUR) TIMES DAILY AS NEEDED FOR WHEEZING OR SHORTNESS OF BREATH. 05/26/17   Mosie Lukes, MD  allopurinol (ZYLOPRIM) 100 MG tablet TAKE 2 TABLETS ONE TIME DAILY Patient taking differently: Take 200 mg by mouth daily.  10/29/17   Mosie Lukes, MD  b complex vitamins tablet Take 1 tablet by mouth daily.    [provider]  budesonide-formoterol (SYMBICORT) 160-4.5 MCG/ACT inhaler INHALE 2 PUFFS INTO THE LUNGS 2 TIMES DAILY Patient taking differently: Inhale 2 puffs into the lungs  2 (two) times daily.  05/26/17   Mosie Lukes, MD  cholecalciferol (VITAMIN D) 1000 units tablet Take 1,000 Units daily by mouth.    [provider]  famotidine (PEPCID) 20 MG tablet Take 1 tablet (20 mg total) by mouth daily. 11/16/17   Mosie Lukes, MD  Ferrous Fumarate-Folic Acid (HEMOCYTE-F) 324-1 MG TABS Take 1 tablet by  mouth daily. 07/24/16   Mosie Lukes, MD  fluticasone (FLONASE) 50 MCG/ACT nasal spray Place 1 spray into both nostrils daily. Patient taking differently: Place 1 spray into both nostrils daily as needed for allergies.  03/06/16   Magdalen Spatz, NP  furosemide (LASIX) 40 MG tablet TAKE 1 TABLET BY MOUTH DAILY AND A 2ND TABLET DAILY AS NEEDED FOR INCREASED EDEMA OR WEIGHT GAIN >3 LBS/24 HOUR 10/08/17   Mosie Lukes, MD  glucose blood (ONE TOUCH TEST STRIPS) test strip Use as instructed to check blood sugar once weekly. DX 790.29     [provider]  loratadine (CLARITIN) 10 MG tablet Take 1 tablet (10 mg total) by mouth at bedtime. Patient taking differently: Take 10 mg by mouth at bedtime as needed for allergies.  03/06/16   Magdalen Spatz, NP  metoprolol succinate (TOPROL-XL) 50 MG 24 hr tablet TAKE 1 AND 1/2 TABLETS EVERY DAY Patient taking differently: Take 75 mg by mouth daily.  04/13/17   Mosie Lukes, MD  montelukast (SINGULAIR) 10 MG tablet Take 1 tablet (10 mg total) by mouth at bedtime as needed. 10/08/15   Mosie Lukes, MD  OXYGEN Inhale 2 L into the lungs continuous.     [provider]  potassium chloride SA (K-DUR,KLOR-CON) 20 MEQ tablet Take 1 tablet (20 mEq total) by mouth See admin instructions. And a 2nd tab po daily prn extra Furosemide tab 11/16/17   Mosie Lukes, MD  pravastatin (PRAVACHOL) 10 MG tablet TAKE 1 TABLET BY MOUTH ONCE DAILY Patient taking differently: Take 10 mg by mouth daily.  08/28/17   Mosie Lukes, MD  Probiotic Product (PROBIOTIC PO) Take 1 tablet by mouth daily.     [provider]  sertraline (ZOLOFT) 100 MG tablet Take 1 tablet (100 mg total) by mouth daily. 10/12/17   Mosie Lukes, MD  traMADol (ULTRAM) 50 MG tablet TAKE 1 TABLET BY MOUTH THREE TIMES DAILY AS NEEDED Patient taking differently: Take 50 mg by mouth 2 (two) times daily as needed for moderate pain.  10/29/17   Mosie Lukes, MD  trolamine salicylate  (ASPERCREME) 10 % cream Apply 1 application topically as needed for muscle pain.    [provider]  vitamin B-12 (CYANOCOBALAMIN) 1000 MCG tablet Take 1,000 mcg by mouth daily.     [provider]     Family History  Problem Relation Age of Onset  . Asthma Sister   . Arthritis Sister   . Hypertension Sister   . Hyperlipidemia Sister   . Uterine cancer Sister   . Coronary artery disease Brother        x2  . Arthritis Brother   . Lung cancer Brother        smoker  . Hypertension Sister   . Arthritis Sister   . Hyperlipidemia Sister   . Emphysema Sister   . COPD Sister        smoker  . Heart disease Sister   . Hyperlipidemia Sister   . Hypertension Sister   . Arthritis Sister   .  Emphysema Brother   . Heart disease Brother         smoker  . Heart attack Brother   . Mental illness Father   . Alcohol abuse Father   . Suicidality Father   . Heart disease Mother   . Hyperlipidemia Mother   . Heart attack Mother   . Epilepsy Daughter   . Hypertension Daughter   . Obesity Daughter   . COPD Brother        smoker  . Lung cancer Brother   . Coronary artery disease Other   . Colon polyps Sister     Social History   Socioeconomic History  . Marital status: Widowed    Spouse name: Not on file  . Number of children: 1  . Years of education: Not on file  . Highest education level: Not on file  Occupational History  . 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 Financial resource strain: Not on file  . Food insecurity:    Worry: Not on file    Inability: Not on file  . Transportation needs:    Medical: Not on file    Non-medical: Not on file  Tobacco Use  . Smoking status: Never Smoker  . Smokeless tobacco: Never Used  Substance and Sexual Activity  . Alcohol use: No  . Drug use: No  . Sexual activity: Never    Comment: lives alone, no dietary restrictions  Lifestyle  . Physical activity:    Days per week: Not on file     Minutes per session: Not on file  . Stress: Not on file  Relationships  . Social connections:    Talks on phone: Not on file    Gets together: Not on file    Attends religious service: Not on file    Active member of club or organization: Not on file    Attends meetings of clubs or organizations: Not on file    Relationship status: Not on file  Other Topics Concern  . Not on file  Social History Narrative  . Not on file    Review of Systems: A 12 point ROS discussed and pertinent positives are indicated in the HPI above.  All other systems are negative.  Review of Systems  Constitutional: Negative for fatigue and fever.  Respiratory: Negative for cough and shortness of breath.   Cardiovascular: Negative for chest pain.  Gastrointestinal: Negative for abdominal pain.  Musculoskeletal: Negative for back pain.  Neurological: Positive for headaches. Negative for dizziness, facial asymmetry, speech difficulty, weakness and numbness.  Psychiatric/Behavioral: Negative for behavioral problems and confusion.    Vital Signs: There were no vitals taken for this visit.  Physical Exam  Constitutional: She is oriented to person, place, and time. She appears well-developed. No distress.  Neck: Normal range of motion. Neck supple.  Pulmonary/Chest: Effort normal and breath sounds normal.  Musculoskeletal:  Bilateral lower extremity edema.   Neurological: She is alert and oriented to person, place, and time.  No focal deficits.  Sitting wheelchair.   Skin: She is not diaphoretic.  Nursing note and vitals reviewed.    Imaging: Dg Chest 2 View  Result Date: 11/04/2017 CLINICAL DATA:  Cough.  Shortness of breath.  Fall. EXAM: CHEST - 2 VIEW COMPARISON:  04/16/2016 FINDINGS: There is slight peribronchial thickening which is chronic or recurrent. Overall heart size and pulmonary vascularity are normal. Chronic slight elevation of the left hemidiaphragm. No acute bone abnormalities.  IMPRESSION:  Slight bronchitic changes which could be recurrent or chronic. Electronically Signed   By: Lorriane Shire M.D.   On: 11/04/2017 12:49   Ct Head Wo Contrast  Result Date: 11/05/2017 CLINICAL DATA:  Continued surveillance subarachnoid hemorrhage. [place] a couple of nights ago. No loss of consciousness. EXAM: CT HEAD WITHOUT CONTRAST TECHNIQUE: Contiguous axial images were obtained from the base of the skull through the vertex without intravenous contrast. COMPARISON:  11/04/2017 CT head. FINDINGS: Brain: Slight involution of LEFT frontal subarachnoid hemorrhage noted on the previous exam. No parenchymal hemorrhage. Not previously reported, but present nevertheless, is an acute subdural hematoma on the RIGHT, along the convexity extending down into the middle cranial fossa, no significant midline shift, up to 5 mm thickness, slight involution compared with yesterday's scan. See series 5, image 45. Slight to minimal redistribution of subdural blood along the RIGHT tentorium, no more than 1-2 mm thickness. Vascular: Calcification of the cavernous internal carotid arteries consistent with cerebrovascular atherosclerotic disease. No signs of intracranial large vessel occlusion. Skull: Calvarium intact.  RIGHT frontal scalp hematoma. Sinuses/Orbits: Clear sinuses.  BILATERAL cataract extraction. Other: No middle ear or mastoid fluid. IMPRESSION: Slight involution of previously unreported acute RIGHT subdural hematoma, up to 5 mm thickness without significant midline shift. Slight involution of previously reported LEFT frontal subarachnoid hemorrhage. No new areas of parenchymal hemorrhage or delayed intracerebral hematoma. No skull fracture.  RIGHT frontal scalp hematoma. Electronically Signed   By: Staci Righter M.D.   On: 11/05/2017 08:40   Ct Head Wo Contrast  Result Date: 11/04/2017 CLINICAL DATA:  Posttraumatic headache and neck pain after fall. EXAM: CT HEAD WITHOUT CONTRAST CT CERVICAL SPINE  WITHOUT CONTRAST TECHNIQUE: Multidetector CT imaging of the head and cervical spine was performed following the standard protocol without intravenous contrast. Multiplanar CT image reconstructions of the cervical spine were also generated. COMPARISON:  CT scan of October 22, 2016. FINDINGS: CT HEAD FINDINGS Brain: Small amount of subarachnoid hemorrhage is noted in the left frontal lobe. No mass effect or midline shift is noted. Ventricular size is within normal limits. No acute infarction or mass lesion is noted. Vascular: No hyperdense vessel or unexpected calcification. Skull: Normal. Negative for fracture or focal lesion. Sinuses/Orbits: No acute finding. Other: Small right frontal scalp hematoma is noted. CT CERVICAL SPINE FINDINGS Alignment: Minimal grade 1 anterolisthesis of C5-6 is noted secondary to posterior facet joint hypertrophy. Skull base and vertebrae: No acute fracture. No primary bone lesion or focal pathologic process. Soft tissues and spinal canal: No prevertebral fluid or swelling. No visible canal hematoma. Disc levels: Severe degenerative disc disease is noted extending from C3-4 to C6-7. Upper chest: Negative. Other: None. IMPRESSION: Small focus of subarachnoid hemorrhage seen in left frontal lobe. Critical Value/emergent results were called by telephone at the time of interpretation on 11/04/2017 at 1:06 pm to Dr. Julianne Rice , who verbally acknowledged these results. Severe multilevel degenerative disc disease. No acute abnormality seen in the cervical spine. Electronically Signed   By: Marijo Conception, M.D.   On: 11/04/2017 13:06   Ct Cervical Spine Wo Contrast  Result Date: 11/04/2017 CLINICAL DATA:  Posttraumatic headache and neck pain after fall. EXAM: CT HEAD WITHOUT CONTRAST CT CERVICAL SPINE WITHOUT CONTRAST TECHNIQUE: Multidetector CT imaging of the head and cervical spine was performed following the standard protocol without intravenous contrast. Multiplanar CT image  reconstructions of the cervical spine were also generated. COMPARISON:  CT scan of October 22, 2016. FINDINGS: CT HEAD FINDINGS Brain:  Small amount of subarachnoid hemorrhage is noted in the left frontal lobe. No mass effect or midline shift is noted. Ventricular size is within normal limits. No acute infarction or mass lesion is noted. Vascular: No hyperdense vessel or unexpected calcification. Skull: Normal. Negative for fracture or focal lesion. Sinuses/Orbits: No acute finding. Other: Small right frontal scalp hematoma is noted. CT CERVICAL SPINE FINDINGS Alignment: Minimal grade 1 anterolisthesis of C5-6 is noted secondary to posterior facet joint hypertrophy. Skull base and vertebrae: No acute fracture. No primary bone lesion or focal pathologic process. Soft tissues and spinal canal: No prevertebral fluid or swelling. No visible canal hematoma. Disc levels: Severe degenerative disc disease is noted extending from C3-4 to C6-7. Upper chest: Negative. Other: None. IMPRESSION: Small focus of subarachnoid hemorrhage seen in left frontal lobe. Critical Value/emergent results were called by telephone at the time of interpretation on 11/04/2017 at 1:06 pm to Dr. Julianne Rice , who verbally acknowledged these results. Severe multilevel degenerative disc disease. No acute abnormality seen in the cervical spine. Electronically Signed   By: Marijo Conception, M.D.   On: 11/04/2017 13:06   Mr Jodene Nam Head Wo Contrast  Result Date: 11/05/2017 CLINICAL DATA:  77 year old female status post fall with small right side subdural hematoma, small volume left frontal convexity subarachnoid hemorrhage. Pupil size asymmetry. EXAM: MRI HEAD WITHOUT CONTRAST MRA HEAD WITHOUT CONTRAST TECHNIQUE: Multiplanar, multiecho pulse sequences of the brain and surrounding structures were obtained without intravenous contrast. Angiographic images of the head were obtained using MRA technique without contrast. COMPARISON:  Head CT 0803 hours  today and earlier. FINDINGS: MRI HEAD FINDINGS Brain: Trace subarachnoid hemorrhage evident at the left operculum. There might also be trace subarachnoid blood over the left parietal convexity seen as a small area of diffusion abnormality on series 7, image 37. There are small bilateral subdural hematomas. The right side is more extensive and measures between 2 and 5 millimeters in thickness. The left side subdural is primarily along the posterior convexity and measures 1-2 millimeters in thickness. See series 15, image 14. No midline shift. No intraventricular hemorrhage or other extra-axial blood. No ventriculomegaly. No restricted diffusion convincing for acute infarct. No encephalomalacia or chronic cerebral blood products identified. Cervicomedullary junction and pituitary are within normal limits. Vascular: Major intracranial vascular flow voids are preserved. Skull and upper cervical spine: Degenerative ligamentous hypertrophy about the odontoid results in mild cervicomedullary junction stenosis. Advanced degenerative changes in the cervical spine at C3-C4 and C4-C5 with at least mild degenerative spinal stenosis on series 13 image 11. Sinuses/Orbits: Negative orbits, postoperative changes to both globes. Paranasal sinuses and mastoids are stable and well pneumatized. Other: Visible internal auditory structures appear normal. Right superior convexity mild scalp hematoma. MRA HEAD FINDINGS Antegrade flow in the posterior circulation. Codominant distal vertebral arteries with irregularity compatible with atherosclerosis but no significant stenosis. Patent PICA origins and vertebrobasilar junction. Basilar artery irregularity without stenosis. SCA and PCA origins are patent without stenosis. Both posterior communicating arteries are present. Bilateral PCA branches are within normal limits. Antegrade flow in both ICA siphons. Siphon irregularity greater on the left. No significant siphon stenosis. Ophthalmic and  posterior communicating artery origins are normal. Patent carotid termini. Normal MCA and ACA origins. There is ectasia of the anterior communicating artery versus a small 2-3 millimeter aneurysm as seen on series 9, image 136. Visible ACA branches are within normal limits. MCA M1 segments, MCA bifurcations and visible MCA branches are patent without stenosis. IMPRESSION: MRI: 1. There  are small Bilateral Subdural Hematomas, larger on the right: 5 mm right and 1-2 mm left SDH with no midline shift or significant mass effect. 2. Trace left hemisphere subarachnoid hemorrhage. 3. No other acute intracranial abnormality. Mild left scalp hematoma. 4. Advanced cervical spine degeneration. 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 MRA: 1. Small Aneurysm versus ectasia of the Anterior Communicating Artery, 2-3 mm. In light of the small lesion size, surveillance MRA (such as annual or biennial) might be preferred although Neuro-Interventional Radiology can be consulted to evaluate the appropriateness of potential treatment. Non-emergent evaluation can be arranged by calling [phone] during usual hours. Emergency evaluation can be requested by paging [phone]. 2. Intracranial atherosclerosis without significant arterial stenosis. Electronically Signed   By: Genevie Ann M.D.   On: 11/05/2017 14:39   Mr Brain Wo Contrast  Result Date: 11/05/2017 CLINICAL DATA:  77 year old female status post fall with small right side subdural hematoma, small volume left frontal convexity subarachnoid hemorrhage. Pupil size asymmetry. EXAM: MRI HEAD WITHOUT CONTRAST MRA HEAD WITHOUT CONTRAST TECHNIQUE: Multiplanar, multiecho pulse sequences of the brain and surrounding structures were obtained without intravenous contrast. Angiographic images of the head were obtained using MRA technique without contrast. COMPARISON:  Head CT 0803 hours today and earlier. FINDINGS: MRI HEAD FINDINGS Brain: Trace  subarachnoid hemorrhage evident at the left operculum. There might also be trace subarachnoid blood over the left parietal convexity seen as a small area of diffusion abnormality on series 7, image 37. There are small bilateral subdural hematomas. The right side is more extensive and measures between 2 and 5 millimeters in thickness. The left side subdural is primarily along the posterior convexity and measures 1-2 millimeters in thickness. See series 15, image 14. No midline shift. No intraventricular hemorrhage or other extra-axial blood. No ventriculomegaly. No restricted diffusion convincing for acute infarct. No encephalomalacia or chronic cerebral blood products identified. Cervicomedullary junction and pituitary are within normal limits. Vascular: Major intracranial vascular flow voids are preserved. Skull and upper cervical spine: Degenerative ligamentous hypertrophy about the odontoid results in mild cervicomedullary junction stenosis. Advanced degenerative changes in the cervical spine at C3-C4 and C4-C5 with at least mild degenerative spinal stenosis on series 13 image 11. Sinuses/Orbits: Negative orbits, postoperative changes to both globes. Paranasal sinuses and mastoids are stable and well pneumatized. Other: Visible internal auditory structures appear normal. Right superior convexity mild scalp hematoma. MRA HEAD FINDINGS Antegrade flow in the posterior circulation. Codominant distal vertebral arteries with irregularity compatible with atherosclerosis but no significant stenosis. Patent PICA origins and vertebrobasilar junction. Basilar artery irregularity without stenosis. SCA and PCA origins are patent without stenosis. Both posterior communicating arteries are present. Bilateral PCA branches are within normal limits. Antegrade flow in both ICA siphons. Siphon irregularity greater on the left. No significant siphon stenosis. Ophthalmic and posterior communicating artery origins are normal. Patent  carotid termini. Normal MCA and ACA origins. There is ectasia of the anterior communicating artery versus a small 2-3 millimeter aneurysm as seen on series 9, image 136. Visible ACA branches are within normal limits. MCA M1 segments, MCA bifurcations and visible MCA branches are patent without stenosis. IMPRESSION: MRI: 1. There are small Bilateral Subdural Hematomas, larger on the right: 5 mm right and 1-2 mm left SDH with no midline shift or significant mass effect. 2. Trace left hemisphere subarachnoid hemorrhage. 3. No other acute intracranial abnormality. Mild left scalp hematoma. 4. Advanced cervical spine degeneration. Degenerative spinal stenosis at the cervicomedullary junction, C3-C4 and  C4-C5. MRA: 1. Small Aneurysm versus ectasia of the Anterior Communicating Artery, 2-3 mm. In light of the small lesion size, surveillance MRA (such as annual or biennial) might be preferred although Neuro-Interventional Radiology can be consulted to evaluate the appropriateness of potential treatment. Non-emergent evaluation can be arranged by calling [phone] during usual hours. Emergency evaluation can be requested by paging [phone]. 2. Intracranial atherosclerosis without significant arterial stenosis. Electronically Signed   By: Genevie Ann M.D.   On: 11/05/2017 14:39   Ct Hip Right Wo Contrast  Result Date: 11/06/2017 CLINICAL DATA:  Right hip pain after fall. EXAM: CT OF THE RIGHT HIP WITHOUT CONTRAST TECHNIQUE: Multidetector CT imaging of the right hip was performed according to the standard protocol. Multiplanar CT image reconstructions were also generated. COMPARISON:  CXR 11/04/2017 FINDINGS: Bones/Joint/Cartilage The subtle lucency seen at the base of the right femoral neck on prior radiographs is felt secondary to a nutrient vascular foramen. No acute displaced fracture or joint effusion is noted. Osteoarthritis of the right hip with spurring about the acetabular roof. Mild enthesopathy is also noted  off greater trochanter. Ligaments Suboptimally assessed by CT. Muscles and Tendons There is mild edema along the course of the iliopsoas, series 3/33 through 65 suspicious for muscle strain. No intramuscular hemorrhage. No atrophy. Soft tissues Mild soft tissue contusion along the lateral aspect right hip. IMPRESSION: 1. Osteoarthritis of the right hip without acute fracture or joint dislocation. No effusion. 2. Slightly edematous appearance of the right iliopsoas suspicious for muscle strain. 3. Enthesopathy at the gluteal muscle attachment on the greater trochanter. Electronically Signed   By: Ashley Royalty M.D.   On: 11/06/2017 21:41   Dg Hip Unilat W Or Wo Pelvis 2-3 Views Right  Result Date: 11/04/2017 CLINICAL DATA:  Fall.  Right hip pain. EXAM: DG HIP (WITH OR WITHOUT PELVIS) 2-3V RIGHT COMPARISON:  04/16/2016 pelvic and right hip radiograph FINDINGS: There is slight cortical irregularity in the superior margin of right femoral neck on the pelvic view, suspicious for nondisplaced fracture. No additional potential fracture. No pelvic diastasis. No right hip dislocation. 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 Prominent degenerative changes in the visualized lower lumbar spine. No suspicious focal osseous lesions. IMPRESSION: Slight cortical irregularity in the superior right femoral neck on the pelvic view, suspicious for nondisplaced fracture. Recommend right hip CT for further evaluation. Electronically Signed   By: Ilona Sorrel M.D.   On: 11/04/2017 12:51    Labs:  CBC: Recent Labs    10/16/17 1327 11/04/17 1154 11/05/17 0331 11/07/17 0452  WBC 7.2 6.2 5.1 4.8  HGB 10.6* 10.1* 8.8* 9.0*  HCT 33.9* 32.9* 29.0* 28.6*  PLT 123* 119* 89* 92*    COAGS: No results for input(s): INR, APTT in the last 8760 hours.  BMP: Recent Labs    11/04/17 1154 11/05/17 0331 11/06/17 0422 11/07/17 0452  NA 139 140 140 142  K 5.2* 3.9 4.3 4.3  CL 105 104 107 105  CO2 28 29 27 29   GLUCOSE 95 112*  110* 105*  BUN 29* 26* 26* 26*  CALCIUM 9.6 9.0 9.2 8.9  CREATININE 1.62* 1.49* 1.33* 1.45*  GFRNONAA 30* 33* 38* 34*  GFRAA 34* 38* 44* 39*    LIVER FUNCTION TESTS: Recent Labs    03/19/17 1505 06/16/17 1543 07/16/17 1508 11/04/17 1154 11/07/17 0452  BILITOT 0.5 0.5 0.7 0.7  --   AST 16 14 20 21   --   ALT 10 12 20 16   --  ALKPHOS 40 48 49 44  --   PROT 7.2 7.1 7.0 6.6  --   ALBUMIN 3.9 4.4 3.9 3.9 3.2*    TUMOR MARKERS: No results for input(s): AFPTM, CEA, CA199, CHROMGRNA in the last 8760 hours.  Assessment and Plan: Anterior communicating artery aneurysm Patient recently admitted to [facility] with [place] complicated by SDH. She was also incidentally found to have an anterior communicating artery aneurysm. She is not currently on anticoagulation or anti-platelet therapy.  She is recovering well from her stroke although continues to have expected daily headaches. After discussion with Dr. Estanislado Pandy regarding her available imaging from recent admission, she would like to proceed with further work-up.  Dr. Estanislado Pandy recommends an additional 4 weeks of recovery from her acute stroke followed by CT Head WO [place] to assess status.  At that time, decision will be made between Dr. Estanislado Pandy and patient regarding pursuing diagnostic angiogram.  Schedulers will arrange CT scan in 4 weeks and contact patient.  Patient verbalizes understanding of care plan and agrees.   Thank you for this interesting consult.  I greatly enjoyed meeting [name] and look forward to participating in their care.  A copy of this report was sent to the requesting provider on this date.  Electronically Signed: Docia Barrier, PA 11/17/2017, 3:48 PM   I spent a total of  30 Minutes   in face to face in clinical consultation, greater than 50% of which was counseling/coordinating care for anterior communicating artery aneurysm.

## 2017-11-20 ENCOUNTER — Ambulatory Visit (INDEPENDENT_AMBULATORY_CARE_PROVIDER_SITE_OTHER): Payer: Medicare HMO | Admitting: Family Medicine

## 2017-11-20 VITALS — BP 118/82 | HR 76 | Temp 98.4°F | Resp 18 | Wt 189.0 lb

## 2017-11-20 DIAGNOSIS — N289 Disorder of kidney and ureter, unspecified: Secondary | ICD-10-CM

## 2017-11-20 DIAGNOSIS — R7989 Other specified abnormal findings of blood chemistry: Secondary | ICD-10-CM | POA: Diagnosis not present

## 2017-11-20 DIAGNOSIS — N184 Chronic kidney disease, stage 4 (severe): Secondary | ICD-10-CM

## 2017-11-20 DIAGNOSIS — E782 Mixed hyperlipidemia: Secondary | ICD-10-CM

## 2017-11-20 DIAGNOSIS — I10 Essential (primary) hypertension: Secondary | ICD-10-CM | POA: Diagnosis not present

## 2017-11-20 DIAGNOSIS — Z0000 Encounter for general adult medical examination without abnormal findings: Secondary | ICD-10-CM

## 2017-11-20 DIAGNOSIS — M109 Gout, unspecified: Secondary | ICD-10-CM | POA: Diagnosis not present

## 2017-11-20 DIAGNOSIS — I609 Nontraumatic subarachnoid hemorrhage, unspecified: Secondary | ICD-10-CM | POA: Diagnosis not present

## 2017-11-20 DIAGNOSIS — H6992 Unspecified Eustachian tube disorder, left ear: Secondary | ICD-10-CM

## 2017-11-20 DIAGNOSIS — H6982 Other specified disorders of Eustachian tube, left ear: Secondary | ICD-10-CM

## 2017-11-20 DIAGNOSIS — E559 Vitamin D deficiency, unspecified: Secondary | ICD-10-CM | POA: Diagnosis not present

## 2017-11-20 DIAGNOSIS — E118 Type 2 diabetes mellitus with unspecified complications: Secondary | ICD-10-CM

## 2017-11-20 DIAGNOSIS — D631 Anemia in chronic kidney disease: Secondary | ICD-10-CM

## 2017-11-20 LAB — COMPREHENSIVE METABOLIC PANEL
ALT: 8 U/L (ref 0–35)
AST: 10 U/L (ref 0–37)
Albumin: 4.3 g/dL (ref 3.5–5.2)
Alkaline Phosphatase: 62 U/L (ref 39–117)
BUN: 46 mg/dL — AB (ref 6–23)
CHLORIDE: 101 meq/L (ref 96–112)
CO2: 30 mEq/L (ref 19–32)
Calcium: 9.8 mg/dL (ref 8.4–10.5)
Creatinine, Ser: 1.71 mg/dL — ABNORMAL HIGH (ref 0.40–1.20)
GFR: 30.8 mL/min — AB (ref >60.00)
GLUCOSE: 108 mg/dL — AB (ref 70–99)
POTASSIUM: 4.9 meq/L (ref 3.5–5.1)
SODIUM: 141 meq/L (ref 135–145)
Total Bilirubin: 0.6 mg/dL (ref 0.2–1.2)
Total Protein: 6.7 g/dL (ref 6.0–8.3)

## 2017-11-20 LAB — CBC WITH DIFFERENTIAL/PLATELET
BASOS PCT: 0.8 % (ref 0.0–3.0)
Basophils Absolute: 0.1 10*3/uL (ref 0.0–0.1)
EOS PCT: 3.2 % (ref 0.0–5.0)
Eosinophils Absolute: 0.2 10*3/uL (ref 0.0–0.7)
HCT: 32.2 % — ABNORMAL LOW (ref 36.0–46.0)
Hemoglobin: 10.7 g/dL — ABNORMAL LOW (ref 12.0–15.0)
LYMPHS ABS: 0.9 10*3/uL (ref 0.7–4.0)
Lymphocytes Relative: 12.1 % (ref 12.0–46.0)
MCHC: 33.2 g/dL (ref 30.0–36.0)
MCV: 98.4 fl (ref 78.0–100.0)
MONO ABS: 0.8 10*3/uL (ref 0.1–1.0)
Monocytes Relative: 11 % (ref 3.0–12.0)
NEUTROS PCT: 72.9 % (ref 43.0–77.0)
Neutro Abs: 5.1 10*3/uL (ref 1.4–7.7)
Platelets: 170 10*3/uL (ref 150.0–400.0)
RBC: 3.27 Mil/uL — ABNORMAL LOW (ref 3.87–5.11)
RDW: 15.5 % (ref 11.5–15.5)
WBC: 7.1 10*3/uL (ref 4.0–10.5)

## 2017-11-20 LAB — LIPID PANEL
CHOL/HDL RATIO: 4
CHOLESTEROL: 158 mg/dL (ref 0–200)
HDL: 44.5 mg/dL (ref >39.00)
LDL Cholesterol: 85 mg/dL (ref 0–99)
NonHDL: 113.67
TRIGLYCERIDES: 145 mg/dL (ref 0.0–149.0)
VLDL: 29 mg/dL (ref 0.0–40.0)

## 2017-11-20 LAB — TSH: TSH: 2.64 u[IU]/mL (ref 0.35–4.50)

## 2017-11-20 LAB — HEMOGLOBIN A1C: Hgb A1c MFr Bld: 5.5 % (ref 4.6–6.5)

## 2017-11-20 LAB — VITAMIN B12: Vitamin B-12: >1525 pg/mL — ABNORMAL HIGH (ref 211–911)

## 2017-11-20 LAB — VITAMIN D 25 HYDROXY (VIT D DEFICIENCY, FRACTURES): VITD: 35.99 ng/mL (ref 30.00–100.00)

## 2017-11-20 LAB — URIC ACID: Uric Acid, Serum: 6.1 mg/dL (ref 2.4–7.0)

## 2017-11-20 NOTE — Assessment & Plan Note (Signed)
Supplement and monitor 

## 2017-11-20 NOTE — Assessment & Plan Note (Signed)
No recent flare.  

## 2017-11-20 NOTE — Patient Instructions (Addendum)
Goal BP 100-140/60-90 Hypertension Hypertension, commonly called high blood pressure, is when the force of blood pumping through the arteries is too strong. The arteries are the blood vessels that carry blood from the heart throughout the body. Hypertension forces the heart to work harder to pump blood and may cause arteries to become narrow or stiff. Having untreated or uncontrolled hypertension can cause heart attacks, strokes, kidney disease, and other problems. A blood pressure reading consists of a higher number over a lower number. Ideally, your blood pressure should be below 120/80. The first ("top") number is called the systolic pressure. It is a measure of the pressure in your arteries as your heart beats. The second ("bottom") number is called the diastolic pressure. It is a measure of the pressure in your arteries as the heart relaxes. What are the causes? The cause of this condition is not known. What increases the risk? Some risk factors for high blood pressure are under your control. Others are not. Factors you can change  Smoking.  Having type 2 diabetes mellitus, high cholesterol, or both.  Not getting enough exercise or physical activity.  Being overweight.  Having too much fat, sugar, calories, or salt (sodium) in your diet.  Drinking too much alcohol. Factors that are difficult or impossible to change  Having chronic kidney disease.  Having a family history of high blood pressure.  Age. Risk increases with age.  Race. You may be at higher risk if you are African-American.  Gender. Men are at higher risk than women before age 32. After age 69, women are at higher risk than men.  Having obstructive sleep apnea.  Stress. What are the signs or symptoms? Extremely high blood pressure (hypertensive crisis) may cause:  Headache.  Anxiety.  Shortness of breath.  Nosebleed.  Nausea and vomiting.  Severe chest pain.  Jerky movements you cannot control  (seizures).  How is this diagnosed? This condition is diagnosed by measuring your blood pressure while you are seated, with your arm resting on a surface. The cuff of the blood pressure monitor will be placed directly against the skin of your upper arm at the level of your heart. It should be measured at least twice using the same arm. Certain conditions can cause a difference in blood pressure between your right and left arms. Certain factors can cause blood pressure readings to be lower or higher than normal (elevated) for a short period of time:  When your blood pressure is higher when you are in a health care provider's office than when you are at home, this is called white coat hypertension. Most people with this condition do not need medicines.  When your blood pressure is higher at home than when you are in a health care provider's office, this is called masked hypertension. Most people with this condition may need medicines to control blood pressure.  If you have a high blood pressure reading during one visit or you have normal blood pressure with other risk factors:  You may be asked to return on a different day to have your blood pressure checked again.  You may be asked to monitor your blood pressure at home for 1 week or longer.  If you are diagnosed with hypertension, you may have other blood or imaging tests to help your health care provider understand your overall risk for other conditions. How is this treated? This condition is treated by making healthy lifestyle changes, such as eating healthy foods, exercising more, and reducing your  alcohol intake. Your health care provider may prescribe medicine if lifestyle changes are not enough to get your blood pressure under control, and if:  Your systolic blood pressure is above 130.  Your diastolic blood pressure is above 80.  Your personal target blood pressure may vary depending on your medical conditions, your age, and other  factors. Follow these instructions at home: Eating and drinking  Eat a diet that is high in fiber and potassium, and low in sodium, added sugar, and fat. An example eating plan is called the DASH (Dietary Approaches to Stop Hypertension) diet. To eat this way: ? Eat plenty of fresh fruits and vegetables. Try to fill half of your plate at each meal with fruits and vegetables. ? Eat whole grains, such as whole wheat pasta, brown rice, or whole grain bread. Fill about one quarter of your plate with whole grains. ? Eat or drink low-fat dairy products, such as skim milk or low-fat yogurt. ? Avoid fatty cuts of meat, processed or cured meats, and poultry with skin. Fill about one quarter of your plate with lean proteins, such as fish, chicken without skin, beans, eggs, and tofu. ? Avoid premade and processed foods. These tend to be higher in sodium, added sugar, and fat.  Reduce your daily sodium intake. Most people with hypertension should eat less than 1,500 mg of sodium a day.  Limit alcohol intake to no more than 1 drink a day for nonpregnant women and 2 drinks a day for men. One drink equals 12 oz of beer, 5 oz of wine, or 1 oz of hard liquor. Lifestyle  Work with your health care provider to maintain a healthy body weight or to lose weight. Ask what an ideal weight is for you.  Get at least 30 minutes of exercise that causes your heart to beat faster (aerobic exercise) most days of the week. Activities may include walking, swimming, or biking.  Include exercise to strengthen your muscles (resistance exercise), such as pilates or lifting weights, as part of your weekly exercise routine. Try to do these types of exercises for 30 minutes at least 3 days a week.  Do not use any products that contain nicotine or tobacco, such as cigarettes and e-cigarettes. If you need help quitting, ask your health care provider.  Monitor your blood pressure at home as told by your health care provider.  Keep  all follow-up visits as told by your health care provider. This is important. Medicines  Take over-the-counter and prescription medicines only as told by your health care provider. Follow directions carefully. Blood pressure medicines must be taken as prescribed.  Do not skip doses of blood pressure medicine. Doing this puts you at risk for problems and can make the medicine less effective.  Ask your health care provider about side effects or reactions to medicines that you should watch for. Contact a health care provider if:  You think you are having a reaction to a medicine you are taking.  You have headaches that keep coming back (recurring).  You feel dizzy.  You have swelling in your ankles.  You have trouble with your vision. Get help right away if:  You develop a severe headache or confusion.  You have unusual weakness or numbness.  You feel faint.  You have severe pain in your chest or abdomen.  You vomit repeatedly.  You have trouble breathing. Summary  Hypertension is when the force of blood pumping through your arteries is too strong. If this  condition is not controlled, it may put you at risk for serious complications.  Your personal target blood pressure may vary depending on your medical conditions, your age, and other factors. For most people, a normal blood pressure is less than 120/80.  Hypertension is treated with lifestyle changes, medicines, or a combination of both. Lifestyle changes include weight loss, eating a healthy, low-sodium diet, exercising more, and limiting alcohol. This information is not intended to replace advice given to you by your health care provider. Make sure you discuss any questions you have with your health care provider. Document Released: 01/06/2005 Document Revised: 12/05/2015 Document Reviewed: 12/05/2015 Elsevier Interactive Patient Education  [employer].

## 2017-11-20 NOTE — Assessment & Plan Note (Signed)
hgba1c acceptable, minimize simple carbs. Increase exercise as tolerated. Continue current meds 

## 2017-11-20 NOTE — Assessment & Plan Note (Signed)
Check tsh 

## 2017-11-20 NOTE — Assessment & Plan Note (Signed)
Well controlled, no changes to meds. Encouraged heart healthy diet such as the DASH diet and exercise as tolerated.  °

## 2017-11-22 NOTE — Assessment & Plan Note (Signed)
Encouraged heart healthy diet, increase exercise, avoid trans fats, consider a krill oil cap daily 

## 2017-11-22 NOTE — Progress Notes (Signed)
Subjective:    Patient ID: [name], female    DOB: 07/27/1940, 77 y.o.   MRN: [id number]  No chief complaint on file.   HPI Patient is in today for hospital follow-up.  She was recently hospitalized with a subarachnoid hemorrhage and is now home and doing well.  She denies any residual symptoms from the [facility].  She denies any headache or neurologic complaints that are new.  She does have some dysfunction and discomfort in her left ear however.  This is an intermittent problem and is flared at the moment.  She endorses tinnitus and some pressure.  No discharge.  No fevers or chills.  No recent febrile illness. Denies CP/palp/SOB/HA/congestion/fevers/GI or GU c/o. Taking meds as prescribed  Past Medical History:  Diagnosis Date  . Abnormal glucose tolerance test   . Abnormal TSH 09/22/2016  . ACE-inhibitor cough   . Anemia 03/12/2014  . Arthritis   . Asthma    PFT 02/06/09 FEV1 1.41 (65%), FVC 1.92 (64), FEV1% 74, TLC 3.47 (71%), DLCO 48%, +BD  . Atypical chest pain    s/p cath, Normal coronaries, Non ST elevation myocardial infarction, Rt groin pseudoaneurysm  . Bacterial vaginosis 03/12/2014  . Cellulitis 06/22/2016  . Chronic kidney disease (CKD), stage III (moderate) ([place]) 08/04/2016  . COPD (chronic obstructive pulmonary disease) ([place])   . Depression   . Diastolic heart failure ([place]) 04/07/2016  . DVT (deep venous thrombosis) ([place]) 1987  . GERD (gastroesophageal reflux disease)   . Gout   . Hypercalcemia 10/15/2014  . Hyperlipidemia   . Hypertension   . Hypoxia 10/15/2014  . Macular degeneration 04/10/2015  . Osteopenia 12/29/2006   Qualifier: Diagnosis of  By: Wynona Luna   . Pneumonia   . 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 Renal insufficiency 09/22/2016  . Unspecified constipation 06/05/2013  . Vitamin D deficiency 01/01/2015    Past Surgical History:  Procedure Laterality Date  . APPENDECTOMY  1951  . TONSILLECTOMY  1950  . TUBAL LIGATION  1968    Family  History  Problem Relation Age of Onset  . Asthma Sister   . Arthritis Sister   . Hypertension Sister   . Hyperlipidemia Sister   . Uterine cancer Sister   . Coronary artery disease Brother        x2  . Arthritis Brother   . Lung cancer Brother        smoker  . Hypertension Sister   . Arthritis Sister   . Hyperlipidemia Sister   . Emphysema Sister   . COPD Sister        smoker  . Heart disease Sister   . Hyperlipidemia Sister   . Hypertension Sister   . Arthritis Sister   . Emphysema Brother   . Heart disease Brother         smoker  . Heart attack Brother   . Mental illness Father   . Alcohol abuse Father   . Suicidality Father   . Heart disease Mother   . Hyperlipidemia Mother   . Heart attack Mother   . Epilepsy Daughter   . Hypertension Daughter   . Obesity Daughter   . COPD Brother        smoker  . Lung cancer Brother   . Coronary artery disease Other   . Colon polyps Sister     Social History   Socioeconomic History  . Marital status: Widowed    Spouse name: Not on file  .  Number of children: 1  . Years of education: Not on file  . Highest education level: Not on file  Occupational History  . 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 Financial resource strain: Not on file  . Food insecurity:    Worry: Not on file    Inability: Not on file  . Transportation needs:    Medical: Not on file    Non-medical: Not on file  Tobacco Use  . Smoking status: Never Smoker  . Smokeless tobacco: Never Used  Substance and Sexual Activity  . Alcohol use: No  . Drug use: No  . Sexual activity: Never    Comment: lives alone, no dietary restrictions  Lifestyle  . Physical activity:    Days per week: Not on file    Minutes per session: Not on file  . Stress: Not on file  Relationships  . Social connections:    Talks on phone: Not on file    Gets together: Not on file    Attends religious service: Not on file    Active member of club or  organization: Not on file    Attends meetings of clubs or organizations: Not on file    Relationship status: Not on file  . Intimate partner violence:    Fear of current or ex partner: Not on file    Emotionally abused: Not on file    Physically abused: Not on file    Forced sexual activity: Not on file  Other Topics Concern  . Not on file  Social History Narrative  . Not on file    Outpatient Medications Prior to Visit  Medication Sig Dispense Refill  . acetaminophen (TYLENOL) 500 MG tablet Take 500 mg by mouth 2 (two) times daily.     Marland Kitchen albuterol (ACCUNEB) 0.63 MG/3ML nebulizer solution Take 3 mLs (0.63 mg total) by nebulization every 6 (six) hours as needed for wheezing or shortness of breath. 150 mL 0  . albuterol (PROVENTIL HFA;VENTOLIN HFA) 108 (90 Base) MCG/ACT inhaler INHALE 2 PUFFS INTO THE LUNGS 4 (FOUR) TIMES DAILY AS NEEDED FOR WHEEZING OR SHORTNESS OF BREATH. 54 g 0  . allopurinol (ZYLOPRIM) 100 MG tablet TAKE 2 TABLETS ONE TIME DAILY (Patient taking differently: Take 200 mg by mouth daily. ) 180 tablet 1  . b complex vitamins tablet Take 1 tablet by mouth daily.    . budesonide-formoterol (SYMBICORT) 160-4.5 MCG/ACT inhaler INHALE 2 PUFFS INTO THE LUNGS 2 TIMES DAILY (Patient taking differently: Inhale 2 puffs into the lungs 2 (two) times daily. ) 3 Inhaler 1  . cholecalciferol (VITAMIN D) 1000 units tablet Take 1,000 Units daily by mouth.    . famotidine (PEPCID) 20 MG tablet Take 1 tablet (20 mg total) by mouth daily. 90 tablet 1  . Ferrous Fumarate-Folic Acid (HEMOCYTE-F) 324-1 MG TABS Take 1 tablet by mouth daily. 90 each 1  . fluticasone (FLONASE) 50 MCG/ACT nasal spray Place 1 spray into both nostrils daily. (Patient taking differently: Place 1 spray into both nostrils daily as needed for allergies. ) 16 g 3  . furosemide (LASIX) 40 MG tablet TAKE 1 TABLET BY MOUTH DAILY AND A 2ND TABLET DAILY AS NEEDED FOR INCREASED EDEMA OR WEIGHT GAIN >3 LBS/24 HOUR 100 tablet 5  .  glucose blood (ONE TOUCH TEST STRIPS) test strip Use as instructed to check blood sugar once weekly. DX 790.29     . loratadine (CLARITIN) 10 MG tablet Take 1 tablet (10  mg total) by mouth at bedtime. (Patient taking differently: Take 10 mg by mouth at bedtime as needed for allergies. ) 30 tablet 5  . metoprolol succinate (TOPROL-XL) 50 MG 24 hr tablet TAKE 1 AND 1/2 TABLETS EVERY DAY (Patient taking differently: Take 75 mg by mouth daily. ) 135 tablet 3  . montelukast (SINGULAIR) 10 MG tablet Take 1 tablet (10 mg total) by mouth at bedtime as needed. 30 tablet 3  . OXYGEN Inhale 2 L into the lungs continuous.     . potassium chloride SA (K-DUR,KLOR-CON) 20 MEQ tablet Take 1 tablet (20 mEq total) by mouth See admin instructions. And a 2nd tab po daily prn extra Furosemide tab 90 tablet 1  . pravastatin (PRAVACHOL) 10 MG tablet TAKE 1 TABLET BY MOUTH ONCE DAILY (Patient taking differently: Take 10 mg by mouth daily. ) 90 tablet 1  . Probiotic Product (PROBIOTIC PO) Take 1 tablet by mouth daily.     . sertraline (ZOLOFT) 100 MG tablet Take 1 tablet (100 mg total) by mouth daily. 90 tablet 1  . traMADol (ULTRAM) 50 MG tablet TAKE 1 TABLET BY MOUTH THREE TIMES DAILY AS NEEDED (Patient taking differently: Take 50 mg by mouth 2 (two) times daily as needed for moderate pain. ) 90 tablet 0  . trolamine salicylate (ASPERCREME) 10 % cream Apply 1 application topically as needed for muscle pain.    . vitamin B-12 (CYANOCOBALAMIN) 1000 MCG tablet Take 1,000 mcg by mouth daily.      No facility-administered medications prior to visit.     Allergies  Allergen Reactions  . Oseltamivir Phosphate     TAMIFLU REACTION: nausea, vomiting, diarrhea, dizziness  . Sulfa Antibiotics Other (See Comments)    CP  . Ace Inhibitors   . Indomethacin   . Montelukast Sodium     REACTION: Heart palpitations, chest pain  . Sulfonamide Derivatives     Review of Systems  Constitutional: Positive for malaise/fatigue.  Negative for fever.  HENT: Positive for ear pain and tinnitus. Negative for congestion and ear discharge.   Eyes: Negative for blurred vision.  Respiratory: Negative for shortness of breath.   Cardiovascular: Negative for chest pain, palpitations and leg swelling.  Gastrointestinal: Negative for abdominal pain, blood in stool and nausea.  Genitourinary: Negative for dysuria and frequency.  Musculoskeletal: Negative for falls.  Skin: Negative for rash.  Neurological: Negative for dizziness, loss of consciousness and headaches.  Endo/Heme/Allergies: Negative for environmental allergies.  Psychiatric/Behavioral: Negative for depression. The patient is not nervous/anxious.        Objective:    Physical Exam  Constitutional: She is oriented to person, place, and time. She appears well-developed and well-nourished. No distress.  frail  HENT:  Head: Normocephalic and atraumatic.  Nose: Nose normal.  Eyes: Right eye exhibits no discharge. Left eye exhibits no discharge.  Neck: Normal range of motion. Neck supple.  Cardiovascular: Normal rate and regular rhythm.  No murmur heard. Pulmonary/Chest: Effort normal and breath sounds normal.  Abdominal: Soft. Bowel sounds are normal. There is no tenderness.  Musculoskeletal: She exhibits no edema.  Neurological: She is alert and oriented to person, place, and time.  Skin: Skin is warm and dry.  Psychiatric: She has a normal mood and affect.  Nursing note and vitals reviewed.   BP 118/82   Pulse 76   Temp 98.4 F (36.9 C) (Oral)   Resp 18   Wt 189 lb (85.7 kg)   SpO2 100%   BMI 31.45  kg/m  Wt Readings from Last 3 Encounters:  11/20/17 189 lb (85.7 kg)  10/16/17 194 lb (88 kg)  09/17/17 192 lb 6.4 oz (87.3 kg)     Lab Results  Component Value Date   WBC 7.1 11/20/2017   HGB 10.7 (L) 11/20/2017   HCT 32.2 (L) 11/20/2017   PLT 170.0 11/20/2017   GLUCOSE 108 (H) 11/20/2017   CHOL 158 11/20/2017   TRIG 145.0 11/20/2017   HDL  44.50 11/20/2017   LDLDIRECT 73.0 06/16/2017   LDLCALC 85 11/20/2017   ALT 8 11/20/2017   AST 10 11/20/2017   NA 141 11/20/2017   K 4.9 11/20/2017   CL 101 11/20/2017   CREATININE 1.71 (H) 11/20/2017   BUN 46 (H) 11/20/2017   CO2 30 11/20/2017   TSH 2.64 11/20/2017   INR 1.0 09/12/2006   HGBA1C 5.5 11/20/2017   MICROALBUR 0.8 02/28/2014    Lab Results  Component Value Date   TSH 2.64 11/20/2017   Lab Results  Component Value Date   WBC 7.1 11/20/2017   HGB 10.7 (L) 11/20/2017   HCT 32.2 (L) 11/20/2017   MCV 98.4 11/20/2017   PLT 170.0 11/20/2017   Lab Results  Component Value Date   NA 141 11/20/2017   K 4.9 11/20/2017   CO2 30 11/20/2017   GLUCOSE 108 (H) 11/20/2017   BUN 46 (H) 11/20/2017   CREATININE 1.71 (H) 11/20/2017   BILITOT 0.6 11/20/2017   ALKPHOS 62 11/20/2017   AST 10 11/20/2017   ALT 8 11/20/2017   PROT 6.7 11/20/2017   ALBUMIN 4.3 11/20/2017   CALCIUM 9.8 11/20/2017   ANIONGAP 8 11/07/2017   GFR 30.80 (L) 11/20/2017   Lab Results  Component Value Date   CHOL 158 11/20/2017   Lab Results  Component Value Date   HDL 44.50 11/20/2017   Lab Results  Component Value Date   LDLCALC 85 11/20/2017   Lab Results  Component Value Date   TRIG 145.0 11/20/2017   Lab Results  Component Value Date   CHOLHDL 4 11/20/2017   Lab Results  Component Value Date   HGBA1C 5.5 11/20/2017       Assessment & Plan:   Problem List Items Addressed This Visit    Essential hypertension - Primary    Well controlled, no changes to meds. Encouraged heart healthy diet such as the DASH diet and exercise as tolerated.       Relevant Orders   TSH (Completed)   CBC with Differential/Platelet (Completed)   Diabetes type 2, controlled ([place])    hgba1c acceptable, minimize simple carbs. Increase exercise as tolerated. 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 No changes      High serum vitamin B12    Drop to every other day.       Relevant Orders   Vitamin B12 (Completed)      I am having [name] maintain her glucose blood, acetaminophen, vitamin B-12, OXYGEN, b complex vitamins, Probiotic Product (PROBIOTIC PO), montelukast, loratadine, fluticasone, Ferrous Fumarate-Folic Acid, cholecalciferol, albuterol, metoprolol succinate, albuterol, budesonide-formoterol, pravastatin, furosemide, sertraline, allopurinol, traMADol, trolamine salicylate, potassium chloride SA, and famotidine.  No orders of the defined types were placed in this encounter.     Penni Homans, MD

## 2017-11-22 NOTE — Assessment & Plan Note (Signed)
Is doing well without any residual symptoms since returning home from the hospital. No changes

## 2017-11-22 NOTE — Assessment & Plan Note (Signed)
Improved some today

## 2017-11-22 NOTE — Assessment & Plan Note (Signed)
Encouraged nasal saline and try valsalva maneuver twice daily

## 2017-11-22 NOTE — Assessment & Plan Note (Signed)
Worsening slightly again with acute illness increase hydration and repeat CMP is established with nephrology

## 2017-11-22 NOTE — Assessment & Plan Note (Signed)
Drop to every other day.

## 2017-12-14 ENCOUNTER — Inpatient Hospital Stay: Payer: Medicare HMO | Attending: Family | Admitting: Family

## 2017-12-14 ENCOUNTER — Inpatient Hospital Stay: Payer: Medicare HMO

## 2017-12-14 ENCOUNTER — Encounter: Payer: Self-pay | Admitting: Family

## 2017-12-14 ENCOUNTER — Other Ambulatory Visit: Payer: Self-pay

## 2017-12-14 ENCOUNTER — Telehealth: Payer: Self-pay | Admitting: Family Medicine

## 2017-12-14 VITALS — BP 121/55 | HR 77 | Temp 98.2°F | Resp 19 | Wt 190.0 lb

## 2017-12-14 DIAGNOSIS — D696 Thrombocytopenia, unspecified: Secondary | ICD-10-CM | POA: Diagnosis not present

## 2017-12-14 DIAGNOSIS — D508 Other iron deficiency anemias: Secondary | ICD-10-CM

## 2017-12-14 DIAGNOSIS — D631 Anemia in chronic kidney disease: Secondary | ICD-10-CM | POA: Insufficient documentation

## 2017-12-14 DIAGNOSIS — D7589 Other specified diseases of blood and blood-forming organs: Secondary | ICD-10-CM | POA: Insufficient documentation

## 2017-12-14 DIAGNOSIS — I5032 Chronic diastolic (congestive) heart failure: Secondary | ICD-10-CM | POA: Diagnosis not present

## 2017-12-14 DIAGNOSIS — Z7982 Long term (current) use of aspirin: Secondary | ICD-10-CM | POA: Diagnosis not present

## 2017-12-14 DIAGNOSIS — E119 Type 2 diabetes mellitus without complications: Secondary | ICD-10-CM | POA: Diagnosis not present

## 2017-12-14 DIAGNOSIS — Z79899 Other long term (current) drug therapy: Secondary | ICD-10-CM | POA: Diagnosis not present

## 2017-12-14 DIAGNOSIS — N183 Chronic kidney disease, stage 3 unspecified: Secondary | ICD-10-CM

## 2017-12-14 LAB — CMP (CANCER CENTER ONLY)
ALT: 8 U/L (ref 0–44)
ANION GAP: 11 (ref 5–15)
AST: 14 U/L — AB (ref 15–41)
Albumin: 4 g/dL (ref 3.5–5.0)
Alkaline Phosphatase: 57 U/L (ref 38–126)
BILIRUBIN TOTAL: 0.5 mg/dL (ref 0.3–1.2)
BUN: 41 mg/dL — ABNORMAL HIGH (ref 8–23)
CO2: 25 mmol/L (ref 22–32)
CREATININE: 1.76 mg/dL — AB (ref 0.44–1.00)
Calcium: 10 mg/dL (ref 8.9–10.3)
Chloride: 106 mmol/L (ref 98–111)
GFR, Est AFR Am: 31 mL/min — ABNORMAL LOW (ref >60)
GFR, Estimated: 27 mL/min — ABNORMAL LOW (ref >60)
Glucose, Bld: 152 mg/dL — ABNORMAL HIGH (ref 70–99)
Potassium: 4.5 mmol/L (ref 3.5–5.1)
SODIUM: 142 mmol/L (ref 135–145)
Total Protein: 7.1 g/dL (ref 6.5–8.1)

## 2017-12-14 LAB — CBC WITH DIFFERENTIAL (CANCER CENTER ONLY)
Abs Immature Granulocytes: 0.02 10*3/uL (ref 0.00–0.07)
Basophils Absolute: 0 10*3/uL (ref 0.0–0.1)
Basophils Relative: 0 %
EOS PCT: 4 %
Eosinophils Absolute: 0.3 10*3/uL (ref 0.0–0.5)
HEMATOCRIT: 33.3 % — AB (ref 36.0–46.0)
Hemoglobin: 10.3 g/dL — ABNORMAL LOW (ref 12.0–15.0)
IMMATURE GRANULOCYTES: 0 %
LYMPHS ABS: 1.1 10*3/uL (ref 0.7–4.0)
Lymphocytes Relative: 16 %
MCH: 32.1 pg (ref 26.0–34.0)
MCHC: 30.9 g/dL (ref 30.0–36.0)
MCV: 103.7 fL — ABNORMAL HIGH (ref 80.0–100.0)
Monocytes Absolute: 0.8 10*3/uL (ref 0.1–1.0)
Monocytes Relative: 11 %
Neutro Abs: 4.6 10*3/uL (ref 1.7–7.7)
Neutrophils Relative %: 69 %
Platelet Count: 147 10*3/uL — ABNORMAL LOW (ref 150–400)
RBC: 3.21 MIL/uL — AB (ref 3.87–5.11)
RDW: 13.5 % (ref 11.5–15.5)
WBC Count: 6.8 10*3/uL (ref 4.0–10.5)
nRBC: 0 % (ref 0.0–0.2)

## 2017-12-14 LAB — SAVE SMEAR(SSMR), FOR PROVIDER SLIDE REVIEW

## 2017-12-14 MED ORDER — DARBEPOETIN ALFA 300 MCG/0.6ML IJ SOSY
PREFILLED_SYRINGE | INTRAMUSCULAR | Status: AC
  Filled 2017-12-14: qty 0.6

## 2017-12-14 MED ORDER — DARBEPOETIN ALFA 300 MCG/0.6ML IJ SOSY
300.0000 ug | PREFILLED_SYRINGE | Freq: Once | INTRAMUSCULAR | Status: AC
  Administered 2017-12-14: 300 ug via SUBCUTANEOUS

## 2017-12-14 NOTE — Progress Notes (Signed)
Hematology and Oncology Follow Up Visit  [name] [id number] 10-19-1940 77 y.o. 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 She is doing well but does have some mild fatigue at times.  She is on 2L [place] supplemental O2 24 hours a day. She feels that her SOB is well controlled.  She has a runny nose and is taking Allegra D.  No fever, chills, n/v, cough, rash, dizziness, chest pain, palpitations, abdominal pain or changes in bowel or bladder habits.  She takes Dulcolax as needed for constipation. She states that when she strains she may have a little blood on the toilet tissue. She has had no other episodes of bleeding, no bruising or petechiae.  No tenderness, numbness or tingling in her extremities. The swelling in her feet and ankles is controlled at this time. She is taking lasix daily.  No lymphadenopathy noted on exam.  She has a good appetite and is stayign well hydrated. Her weight is stable.   ECOG Performance Status: 1 - Symptomatic but completely ambulatory  Medications:  Allergies as of 12/14/2017      Reactions   Oseltamivir Phosphate    TAMIFLU REACTION: nausea, vomiting, diarrhea, dizziness   Sulfa Antibiotics Other (See Comments)   CP   Ace Inhibitors    Indomethacin    Montelukast Sodium    REACTION: Heart palpitations, chest pain   Sulfonamide Derivatives       Medication List        Accurate as of 12/14/17  1:37 PM. Always use your most recent med list.          acetaminophen 500 MG tablet Commonly known as:  TYLENOL Take 500 mg by mouth 2 (two) times daily.   albuterol 0.63 MG/3ML nebulizer solution Commonly known as:  ACCUNEB Take 3 mLs (0.63 mg total) by nebulization every 6 (six) hours as needed for wheezing or shortness of breath.   albuterol 108 (90 Base) MCG/ACT  inhaler Commonly known as:  PROVENTIL HFA;VENTOLIN HFA INHALE 2 PUFFS INTO THE LUNGS 4 (FOUR) TIMES DAILY AS NEEDED FOR WHEEZING OR SHORTNESS OF BREATH.   allopurinol 100 MG tablet Commonly known as:  ZYLOPRIM TAKE 2 TABLETS ONE TIME DAILY   b complex vitamins tablet Take 1 tablet by mouth daily.   budesonide-formoterol 160-4.5 MCG/ACT inhaler Commonly known as:  SYMBICORT INHALE 2 PUFFS INTO THE LUNGS 2 TIMES DAILY   cholecalciferol 1000 units tablet Commonly known as:  VITAMIN D Take 1,000 Units daily by mouth.   famotidine 20 MG tablet Commonly known as:  PEPCID Take 1 tablet (20 mg total) by mouth daily.   Ferrous Fumarate-Folic Acid 425-9 MG Tabs Take 1 tablet by mouth daily.   fluticasone 50 MCG/ACT nasal spray Commonly known as:  FLONASE Place 1 spray into both nostrils daily.   furosemide 40 MG tablet Commonly known as:  LASIX TAKE 1 TABLET BY MOUTH DAILY AND A 2ND TABLET DAILY AS NEEDED FOR INCREASED EDEMA OR WEIGHT GAIN >3 LBS/24 HOUR   loratadine 10 MG tablet Commonly known as:  CLARITIN Take 1 tablet (10 mg total) by mouth at bedtime.   metoprolol succinate 50 MG 24 hr tablet Commonly known as:  TOPROL-XL TAKE 1 AND 1/2 TABLETS EVERY DAY   montelukast 10 MG tablet Commonly known as:  SINGULAIR Take 1 tablet (10 mg total) by mouth at bedtime as needed.   ONE TOUCH TEST STRIPS test strip Generic drug:  glucose blood Use as instructed to check blood sugar once weekly. DX 790.29   OXYGEN Inhale 2 L into the lungs continuous.   potassium chloride SA 20 MEQ tablet Commonly known as:  K-DUR,KLOR-CON Take 1 tablet (20 mEq total) by mouth See admin instructions. And a 2nd tab po daily prn extra Furosemide tab   pravastatin 10 MG tablet Commonly known as:  PRAVACHOL TAKE 1 TABLET BY MOUTH ONCE DAILY   PROBIOTIC PO Take 1 tablet by mouth daily.   sertraline 100 MG tablet Commonly known as:  ZOLOFT Take 1 tablet (100 mg total) by mouth daily.     traMADol 50 MG tablet Commonly known as:  ULTRAM TAKE 1 TABLET BY MOUTH THREE TIMES DAILY AS NEEDED   trolamine salicylate 10 % cream Commonly known as:  ASPERCREME Apply 1 application topically as needed for muscle pain.   vitamin B-12 1000 MCG tablet Commonly known as:  CYANOCOBALAMIN Take 1,000 mcg by mouth daily.       Allergies:  Allergies  Allergen Reactions  . Oseltamivir Phosphate     TAMIFLU REACTION: nausea, vomiting, diarrhea, dizziness  . Sulfa Antibiotics Other (See Comments)    CP  . Ace Inhibitors   . Indomethacin   . Montelukast Sodium     REACTION: Heart palpitations, chest pain  . Sulfonamide Derivatives     Past Medical History, Surgical history, Social history, and Family History were reviewed and updated.  Review of Systems: All other 10 point review of systems is negative.   Physical Exam:  weight is 190 lb (86.2 kg). Her oral temperature is 98.2 F (36.8 C). Her blood pressure is 121/55 (abnormal) and her pulse is 77. Her respiration is 19 and oxygen saturation is 100%.   Wt Readings from Last 3 Encounters:  12/14/17 190 lb (86.2 kg)  11/20/17 189 lb (85.7 kg)  10/16/17 194 lb (88 kg)    Ocular: Sclerae unicteric, pupils equal, round and reactive to light Ear-nose-throat: Oropharynx clear, dentition fair Lymphatic: No cervical, supraclavicular or axillary adenopathy Lungs no rales or rhonchi, good excursion bilaterally Heart regular rate and rhythm, no murmur appreciated Abd soft, nontender, positive bowel sounds, no liver or spleen tip palpated on exam, no fluid wave MSK no focal spinal tenderness, no joint edema Neuro: non-focal, well-oriented, appropriate affect Breasts: Deferred    Lab Results  Component Value Date   WBC 6.8 12/14/2017   HGB 10.3 (L) 12/14/2017   HCT 33.3 (L) 12/14/2017   MCV 103.7 (H) 12/14/2017   PLT 147 (L) 12/14/2017   Lab Results  Component Value Date   FERRITIN 368 (H) 10/16/2017   IRON 68 10/16/2017    TIBC 254 10/16/2017   UIBC 186 10/16/2017   IRONPCTSAT 27 10/16/2017   Lab Results  Component Value Date   RETICCTPCT 1.2 10/16/2017   RBC 3.21 (L) 12/14/2017   No results found for: KPAFRELGTCHN, LAMBDASER, KAPLAMBRATIO No results found for: IGGSERUM, IGA, IGMSERUM No results found for: Ronnald Ramp, A1GS, A2GS, Violet Baldy, MSPIKE, SPEI   Chemistry      Component Value Date/Time   NA 141 11/20/2017 1216   NA 143 07/21/2017 1501   K 4.9 11/20/2017 1216   CL 101 11/20/2017 1216   CO2 30 11/20/2017 1216   BUN 46 (H) 11/20/2017 1216   BUN 41 (H) 07/21/2017 1501  CREATININE 1.71 (H) 11/20/2017 1216   CREATININE 2.00 (H) 07/16/2017 1508   CREATININE 1.36 (H) 08/23/2013 1134      Component Value Date/Time   CALCIUM 9.8 11/20/2017 1216   ALKPHOS 62 11/20/2017 1216   AST 10 11/20/2017 1216   AST 20 07/16/2017 1508   ALT 8 11/20/2017 1216   ALT 20 07/16/2017 1508   BILITOT 0.6 11/20/2017 1216   BILITOT 0.7 07/16/2017 1508       Impression and Plan: [name] is a very pleasant 77 yo caucasian female with multifactorial anemia.  Hgb is 10.3 so she received Aranesp today.  Iron studies are pending an will bring her back in for infusion if needed.  We will plan to see her again in 2 months. She will contact our office with any questions or concerns. We can certainly see her sooner if need be.   Laverna Peace, NP 11/25/20191:37 PM

## 2017-12-14 NOTE — Telephone Encounter (Signed)
We d/c Zantac on 11/16/17 if this is what she is taking about. She is now on Pepcid 40mg . Is there anything I can let patient know about the recall of Zantac that she may be referring to  Please advise

## 2017-12-14 NOTE — Patient Instructions (Signed)

## 2017-12-14 NOTE — Telephone Encounter (Signed)
Pt came in the office stating cancer dr told her the reflux meds she is taking is causing cancer??? Please call her b/c she is needing someone to talk to about this.

## 2017-12-14 NOTE — Telephone Encounter (Signed)
Spoke with patient to let her know about the recall and she was ok.

## 2017-12-15 LAB — FERRITIN: Ferritin: 424 ng/mL — ABNORMAL HIGH (ref 11–307)

## 2017-12-15 LAB — IRON AND TIBC
Iron: 84 ug/dL (ref 41–142)
SATURATION RATIOS: 33 % (ref 21–57)
TIBC: 251 ug/dL (ref 236–444)
UIBC: 168 ug/dL (ref 120–384)

## 2017-12-21 ENCOUNTER — Encounter: Payer: Self-pay | Admitting: Cardiology

## 2017-12-21 ENCOUNTER — Ambulatory Visit: Payer: 59 | Admitting: Cardiology

## 2017-12-21 VITALS — BP 126/72 | HR 95 | Ht 65.0 in | Wt 193.8 lb

## 2017-12-21 DIAGNOSIS — I5042 Chronic combined systolic (congestive) and diastolic (congestive) heart failure: Secondary | ICD-10-CM | POA: Diagnosis not present

## 2017-12-21 DIAGNOSIS — I739 Peripheral vascular disease, unspecified: Secondary | ICD-10-CM | POA: Diagnosis not present

## 2017-12-21 DIAGNOSIS — I10 Essential (primary) hypertension: Secondary | ICD-10-CM

## 2017-12-21 DIAGNOSIS — R55 Syncope and collapse: Secondary | ICD-10-CM

## 2017-12-21 DIAGNOSIS — E782 Mixed hyperlipidemia: Secondary | ICD-10-CM

## 2017-12-21 MED ORDER — PRAVASTATIN SODIUM 20 MG PO TABS: 20.0000 mg | ORAL_TABLET | Freq: Every evening | ORAL | 3 refills | Status: DC

## 2017-12-21 NOTE — Patient Instructions (Signed)
Medication Instructions:  Increase Pravastatin 20 mg, daily  If you need a refill on your cardiac medications before your next appointment, please call your pharmacy.   Lab work: Future: 1/13 LFT and Lipid  If you have labs (blood work) drawn today and your tests are completely normal, you will receive your results only by: Marland Kitchen MyChart Message (if you have MyChart) OR . A paper copy in the mail If you have any lab test that is abnormal or we need to change your treatment, we will call you to review the results.  Follow-Up: At [facility], you and your health needs are our priority.  As part of our continuing mission to provide you with exceptional heart care, we have created designated Provider Care Teams.  These Care Teams include your primary Cardiologist (physician) and Advanced Practice Providers (APPs -  Physician Assistants and Nurse Practitioners) who all work together to provide you with the care you need, when you need it.  Your physician wants you to follow-up in: 6 months with PA. You will receive a reminder letter in the mail two months in advance. If you don't receive a letter, please call our office to schedule the follow-up appointment.   You will need a follow up appointment in 1 years.  Please call our office 2 months in advance to schedule this appointment.  You may see Dr. Radford Pax or one of the following Advanced Practice Providers on your designated Care Team:   [place], PA-C Melina Copa, PA-C . Ermalinda Barrios, PA-C

## 2017-12-21 NOTE — Progress Notes (Signed)
She Cardiology Office Note:    Date:  12/21/2017   ID:  [name], DOB 12-16-1940, MRN [id number]  PCP:  Mosie Lukes, MD  Cardiologist:  No primary care provider on file.    Referring MD: Mosie Lukes, MD   Chief Complaint  Patient presents with  . Congestive Heart Failure  . Hypertension  . Hyperlipidemia    History of Present Illness:    [name] is a 77 y.o. female with a hx of asthma, atypical CP with normal coronary arteries by cath in 2008 in the setting of NSTEMI complicated by right groin pseudoaneurysm, chronic diastolic CHF, GERD, HTN and hyperlipidemia. 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  Last echo showed normal LVF with mild MR and grade I diastolic dysfunction. She is here today for followup and is doing well.  She denies any chest pain or pressure, SOB, DOE, PND, orthopnea, LE edema, dizziness, palpitations or syncope. She is compliant with her meds and is tolerating meds with no SE.    Past Medical History:  Diagnosis Date  . Abnormal glucose tolerance test   . Abnormal TSH 09/22/2016  . ACE-inhibitor cough   . Anemia 03/12/2014  . Arthritis   . Asthma    PFT 02/06/09 FEV1 1.41 (65%), FVC 1.92 (64), FEV1% 74, TLC 3.47 (71%), DLCO 48%, +BD  . Atypical chest pain    s/p cath, Normal coronaries, Non ST elevation myocardial infarction, Rt groin pseudoaneurysm  . Bacterial vaginosis 03/12/2014  . Cellulitis 06/22/2016  . Chronic kidney disease (CKD), stage III (moderate) ([place]) 08/04/2016  . COPD (chronic obstructive pulmonary disease) ([place])   . Depression   . Diastolic heart failure ([place]) 04/07/2016  . DVT (deep venous thrombosis) ([place]) 1987  . GERD (gastroesophageal reflux disease)   . Gout   . Hypercalcemia 10/15/2014  . Hyperlipidemia   . Hypertension   . Hypoxia 10/15/2014  . Macular degeneration 04/10/2015  . Osteopenia 12/29/2006   Qualifier: Diagnosis of  By: Wynona Luna   . Pneumonia   . 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 Renal insufficiency 09/22/2016  . Unspecified constipation 06/05/2013  . Vitamin D deficiency 01/01/2015    Past Surgical History:  Procedure Laterality Date  . APPENDECTOMY  1951  . TONSILLECTOMY  1950  . TUBAL LIGATION  1968    Current Medications: Current Meds  Medication Sig  . acetaminophen (TYLENOL) 500 MG tablet Take 500 mg by mouth 2 (two) times daily.   Marland Kitchen albuterol (ACCUNEB) 0.63 MG/3ML nebulizer solution Take 3 mLs (0.63 mg total) by nebulization every 6 (six) hours as needed for wheezing or shortness of breath.  Marland Kitchen albuterol (PROVENTIL HFA;VENTOLIN HFA) 108 (90 Base) MCG/ACT inhaler INHALE 2 PUFFS INTO THE LUNGS 4 (FOUR) TIMES DAILY AS NEEDED FOR WHEEZING OR SHORTNESS OF BREATH.  Marland Kitchen allopurinol (ZYLOPRIM) 100 MG tablet Take 200 mg by mouth daily.  Marland Kitchen b complex vitamins tablet Take 1 tablet by mouth daily.  . budesonide-formoterol (SYMBICORT) 160-4.5 MCG/ACT inhaler INHALE 2 PUFFS INTO THE LUNGS 2 TIMES DAILY (Patient taking differently: Inhale 2 puffs into the lungs 2 (two) times daily. )  . cholecalciferol (VITAMIN D) 1000 units tablet Take 1,000 Units daily by mouth.  . famotidine (PEPCID) 20 MG tablet Take 1 tablet (20 mg total) by mouth daily.  . Ferrous Fumarate-Folic Acid (HEMOCYTE-F) 324-1 MG TABS Take 1 tablet by mouth daily.  . fluticasone (  FLONASE) 50 MCG/ACT nasal spray Place 1 spray into both nostrils daily. (Patient taking differently: Place 1 spray into both nostrils daily as needed for allergies. )  . furosemide (LASIX) 40 MG tablet TAKE 1 TABLET BY MOUTH DAILY AND A 2ND TABLET DAILY AS NEEDED FOR INCREASED EDEMA OR WEIGHT GAIN >3 LBS/24 HOUR  . glucose blood (ONE TOUCH TEST STRIPS) test strip Use as instructed to check blood sugar once weekly. DX 790.29   . loratadine (CLARITIN) 10 MG tablet Take 1 tablet (10 mg total) by mouth at bedtime. (Patient taking differently: Take 10 mg by mouth at bedtime as needed for allergies. )  .  metoprolol succinate (TOPROL-XL) 50 MG 24 hr tablet TAKE 1 AND 1/2 TABLETS EVERY DAY (Patient taking differently: Take 75 mg by mouth daily. )  . montelukast (SINGULAIR) 10 MG tablet Take 1 tablet (10 mg total) by mouth at bedtime as needed.  . OXYGEN Inhale 2 L into the lungs continuous.   . potassium chloride SA (K-DUR,KLOR-CON) 20 MEQ tablet Take 1 tablet (20 mEq total) by mouth See admin instructions. And a 2nd tab po daily prn extra Furosemide tab  . pravastatin (PRAVACHOL) 10 MG tablet TAKE 1 TABLET BY MOUTH ONCE DAILY (Patient taking differently: Take 10 mg by mouth daily. )  . Probiotic Product (PROBIOTIC PO) Take 1 tablet by mouth daily.   . sertraline (ZOLOFT) 100 MG tablet Take 1 tablet (100 mg total) by mouth daily.  . traMADol (ULTRAM) 50 MG tablet TAKE 1 TABLET BY MOUTH THREE TIMES DAILY AS NEEDED (Patient taking differently: Take 50 mg by mouth 2 (two) times daily as needed for moderate pain. )  . trolamine salicylate (ASPERCREME) 10 % cream Apply 1 application topically as needed for muscle pain.  . vitamin B-12 (CYANOCOBALAMIN) 1000 MCG tablet Take 1,000 mcg by mouth daily.      Allergies:   Oseltamivir phosphate; Sulfa antibiotics; Ace inhibitors; Indomethacin; Montelukast sodium; and Sulfonamide derivatives   Social History   Socioeconomic History  . Marital status: Widowed    Spouse name: Not on file  . Number of children: 1  . Years of education: Not on file  . Highest education level: Not on file  Occupational History  . 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 Financial resource strain: Not on file  . Food insecurity:    Worry: Not on file    Inability: Not on file  . Transportation needs:    Medical: Not on file    Non-medical: Not on file  Tobacco Use  . Smoking status: Never Smoker  . Smokeless tobacco: Never Used  Substance and Sexual Activity  . Alcohol use: No  . Drug use: No  . Sexual activity: Never    Comment: lives alone, no  dietary restrictions  Lifestyle  . Physical activity:    Days per week: Not on file    Minutes per session: Not on file  . Stress: Not on file  Relationships  . Social connections:    Talks on phone: Not on file    Gets together: Not on file    Attends religious service: Not on file    Active member of club or organization: Not on file    Attends meetings of clubs or organizations: Not on file    Relationship status: Not on file  Other Topics Concern  . Not on file  Social History Narrative  . Not on file  Family History: The patient's family history includes Alcohol abuse in her father; Arthritis in her brother, sister, sister, and sister; Asthma in her sister; COPD in her brother and sister; Colon polyps in her sister; Coronary artery disease in her brother and other; Emphysema in her brother and sister; Epilepsy in her daughter; Heart attack in her brother and mother; Heart disease in her brother, mother, and sister; Hyperlipidemia in her mother, sister, sister, and sister; Hypertension in her daughter, sister, sister, and sister; Lung cancer in her brother and brother; Mental illness in her father; Obesity in her daughter; Suicidality in her father; Uterine cancer in her sister.  ROS:   Please see the history of present illness.    ROS  All other systems reviewed and negative.   EKGs/Labs/Other Studies Reviewed:    The following studies were reviewed today: none  EKG:  EKG is not ordered today  Recent Labs: 11/20/2017: TSH 2.64 12/14/2017: ALT 8; BUN 41; Creatinine 1.76; Hemoglobin 10.3; Platelet Count 147; Potassium 4.5; Sodium 142   Recent Lipid Panel    Component Value Date/Time   CHOL 158 11/20/2017 1216   TRIG 145.0 11/20/2017 1216   TRIG 122 01/27/2006 1037   HDL 44.50 11/20/2017 1216   CHOLHDL 4 11/20/2017 1216   VLDL 29.0 11/20/2017 1216   LDLCALC 85 11/20/2017 1216   LDLDIRECT 73.0 06/16/2017 1543    Physical Exam:    VS:  BP 126/72   Pulse 95    Ht 5\' 5"  (1.651 m)   Wt 193 lb 12.8 oz (87.9 kg)   SpO2 96%   BMI 32.25 kg/m     Wt Readings from Last 3 Encounters:  12/21/17 193 lb 12.8 oz (87.9 kg)  12/14/17 190 lb (86.2 kg)  11/20/17 189 lb (85.7 kg)     GEN:  Well nourished, well developed in no acute distress HEENT: Normal NECK: No JVD; No carotid bruits LYMPHATICS: No lymphadenopathy CARDIAC: RRR, no murmurs, rubs, gallops RESPIRATORY:  Clear to auscultation without rales, wheezing or rhonchi  ABDOMEN: Soft, non-tender, non-distended MUSCULOSKELETAL:  No edema; No deformity  SKIN: Warm and dry NEUROLOGIC:  Alert and oriented x 3 PSYCHIATRIC:  Normal affect   ASSESSMENT:    1. Chronic combined systolic and diastolic CHF (congestive heart failure) ([place])   2. Essential hypertension   3. Syncope and collapse   4. Peripheral arterial disease ([place])   5. Hyperlipidemia, mixed    PLAN:    In order of problems listed above:  1.  Chronic diastolic CHF -she appears euvolemic on exam today although her weight is up 3lbs from a week ago.  She will continue on Lasix 40 mg daily and beta-blocker.  The echocardiogram 11/05/2017 showed normal LV function with EF 55 to 60% with grade 1 diastolic dysfunction.  2.  HTN -BP is well controlled on exam today.  She will continue on Toprol-XL 75 mg daily.  3.  Syncope -she has not had any further episodes of dizziness or syncope.  4.  PAD  - Doppler 07/2016 showed greater than 50% bilateral external iliac artery stenosis, 50 to 74% bilateral CFA stenosis, 50 to 74% right distal S of a stenosis with three-vessel runoff bilaterally.  She denies any claudication symptoms.  Encouraged her to continue a walking program.  Continue on statin therapy.  5.  Hyperlipidemia -DL goal is less than 70.  LDL was 85 on 11/20/2017.  I have instructed her to increase her pravastatin to 20 mg daily and we  will repeat an FLP and ALT in 6 weeks.   Medication Adjustments/Labs and Tests Ordered: Current  medicines are reviewed at length with the patient today.  Concerns regarding medicines are outlined above.  No orders of the defined types were placed in this encounter.  No orders of the defined types were placed in this encounter.   Signed, Fransico Him, MD  12/21/2017 1:46 PM    [place]

## 2017-12-23 ENCOUNTER — Other Ambulatory Visit (HOSPITAL_COMMUNITY): Payer: Self-pay | Admitting: Interventional Radiology

## 2017-12-23 DIAGNOSIS — I729 Aneurysm of unspecified site: Secondary | ICD-10-CM

## 2017-12-25 ENCOUNTER — Other Ambulatory Visit: Payer: Self-pay | Admitting: Family Medicine

## 2017-12-25 NOTE — Telephone Encounter (Signed)
Requesting:tramadol Contract:yes UDS:low risk next screen 12/23/17 Last OV:11/20/17 Next OV:01/25/17 Last Refill:1010/19 #[id number] Database:   Please advise

## 2017-12-28 ENCOUNTER — Ambulatory Visit (HOSPITAL_COMMUNITY)
Admission: RE | Admit: 2017-12-28 | Discharge: 2017-12-28 | Disposition: A | Discharge status: Home / Self Care | Payer: Medicare HMO | Source: Ambulatory Visit | Attending: Interventional Radiology | Admitting: Interventional Radiology

## 2017-12-28 ENCOUNTER — Ambulatory Visit: Payer: Medicare HMO | Admitting: Family Medicine

## 2017-12-28 DIAGNOSIS — I729 Aneurysm of unspecified site: Secondary | ICD-10-CM | POA: Diagnosis not present

## 2017-12-28 DIAGNOSIS — I6200 Nontraumatic subdural hemorrhage, unspecified: Secondary | ICD-10-CM | POA: Diagnosis not present

## 2018-01-04 ENCOUNTER — Other Ambulatory Visit (HOSPITAL_COMMUNITY): Payer: Self-pay | Admitting: Interventional Radiology

## 2018-01-04 DIAGNOSIS — I729 Aneurysm of unspecified site: Secondary | ICD-10-CM

## 2018-01-04 DIAGNOSIS — I609 Nontraumatic subarachnoid hemorrhage, unspecified: Secondary | ICD-10-CM

## 2018-01-08 ENCOUNTER — Ambulatory Visit (HOSPITAL_COMMUNITY)
Admission: RE | Admit: 2018-01-08 | Discharge: 2018-01-08 | Disposition: A | Discharge status: Home / Self Care | Payer: Medicare HMO | Source: Ambulatory Visit | Attending: Interventional Radiology | Admitting: Interventional Radiology

## 2018-01-08 DIAGNOSIS — I609 Nontraumatic subarachnoid hemorrhage, unspecified: Secondary | ICD-10-CM

## 2018-01-08 DIAGNOSIS — I729 Aneurysm of unspecified site: Secondary | ICD-10-CM

## 2018-01-08 DIAGNOSIS — I671 Cerebral aneurysm, nonruptured: Secondary | ICD-10-CM | POA: Diagnosis not present

## 2018-01-08 NOTE — Progress Notes (Signed)
Chief Complaint: Patient was seen in consultation today for ACOM aneurysm.  Referring Physician(s): Reyne Dumas  Supervising Physician: Luanne Bras  Patient Status: [facility] - Out-pt  History of Present Illness: [name] is a 77 y.o. female with a past medical history of hypertension, hyperlipidemia, HF, DVT, SAH 10/2017, COPD, asthma, pneumonia, GERD, CKD stage III, multiple myeloma, sickle cell anemia, hypercalcemia, vitamin D deficiency, macular degeneration, polymyalgia rheumatica, osteopenia, gout, arthritis, and depression. She is known to [facility] and has been followed by Dr. Estanislado Pandy since 10/2017. Patient was admitted to [facility] 11/04/2017 to 11/07/2017 for management of [name]. She first presented to our department for an incidental finding of an ACOM aneurysm during this hospitalization. She consulted with Dr. Estanislado Pandy 11/17/2017 to discuss management options for her intracranial aneurysm. At that time, it was determined that patient recover 4 additional weeks, undergo CT head, and re-consult to develop a management plan at that time.  CT head 12/28/2017: 1. Normal noncontrast CT HEAD for age. Resolution of subdural hematomas.  Patient presents today for management of ACOM aneurysm. Patient awake and alert sitting in wheelchair. Accompanied by daughter. Patient states that she is "back to normal" since [facility] 09/2017. Complains of intermittent dizziness when changing positions, stable at this time. Denies syncope associated with dizziness. Denies headache, weakness, numbness/tingling, vision changes, hearing changes, tinnitus, or speech difficulty.   Past Medical History:  Diagnosis Date  . Abnormal glucose tolerance test   . Abnormal TSH 09/22/2016  . ACE-inhibitor cough   . Anemia 03/12/2014  . Arthritis   . Asthma    PFT 02/06/09 FEV1 1.41 (65%), FVC 1.92 (64), FEV1% 74, TLC 3.47 (71%), DLCO 48%, +BD  . Atypical chest pain    s/p cath, Normal coronaries, Non ST elevation  myocardial infarction, Rt groin pseudoaneurysm  . Bacterial vaginosis 03/12/2014  . Cellulitis 06/22/2016  . Chronic kidney disease (CKD), stage III (moderate) ([place]) 08/04/2016  . COPD (chronic obstructive pulmonary disease) ([place])   . Depression   . Diastolic heart failure ([place]) 04/07/2016  . DVT (deep venous thrombosis) ([place]) 1987  . GERD (gastroesophageal reflux disease)   . Gout   . Hypercalcemia 10/15/2014  . Hyperlipidemia   . Hypertension   . Hypoxia 10/15/2014  . Macular degeneration 04/10/2015  . Osteopenia 12/29/2006   Qualifier: Diagnosis of  By: Wynona Luna   . Pneumonia   . Polymyalgia rheumatica ([place]) 01/07/2016  . Renal insufficiency 09/22/2016  . Unspecified constipation 06/05/2013  . Vitamin D deficiency 01/01/2015    Past Surgical History:  Procedure Laterality Date  . APPENDECTOMY  1951  . TONSILLECTOMY  1950  . TUBAL LIGATION  1968    Allergies: Oseltamivir phosphate; Sulfa antibiotics; Ace inhibitors; Indomethacin; Montelukast sodium; and Sulfonamide derivatives  Medications: Prior to Admission medications   Medication Sig Start Date End Date Taking? Authorizing Provider  acetaminophen (TYLENOL) 500 MG tablet Take 500 mg by mouth 2 (two) times daily.     [provider]  albuterol (ACCUNEB) 0.63 MG/3ML nebulizer solution Take 3 mLs (0.63 mg total) by nebulization every 6 (six) hours as needed for wheezing or shortness of breath. 01/16/17   Mosie Lukes, MD  albuterol (PROVENTIL HFA;VENTOLIN HFA) 108 (90 Base) MCG/ACT inhaler INHALE 2 PUFFS INTO THE LUNGS 4 (FOUR) TIMES DAILY AS NEEDED FOR WHEEZING OR SHORTNESS OF BREATH. 05/26/17   Mosie Lukes, MD  allopurinol (ZYLOPRIM) 100 MG tablet Take 200 mg by mouth daily.    [provider]  b complex vitamins tablet Take 1 tablet by mouth daily.    [provider]  budesonide-formoterol (SYMBICORT) 160-4.5 MCG/ACT inhaler INHALE 2 PUFFS INTO THE LUNGS 2 TIMES DAILY Patient taking  differently: Inhale 2 puffs into the lungs 2 (two) times daily.  05/26/17   Mosie Lukes, MD  cholecalciferol (VITAMIN D) 1000 units tablet Take 1,000 Units daily by mouth.    [provider]  famotidine (PEPCID) 20 MG tablet Take 1 tablet (20 mg total) by mouth daily. 11/16/17   Mosie Lukes, MD  Ferrous Fumarate-Folic Acid (HEMOCYTE-F) 324-1 MG TABS Take 1 tablet by mouth daily. 07/24/16   Mosie Lukes, MD  fluticasone (FLONASE) 50 MCG/ACT nasal spray Place 1 spray into both nostrils daily. Patient taking differently: Place 1 spray into both nostrils daily as needed for allergies.  03/06/16   Magdalen Spatz, NP  furosemide (LASIX) 40 MG tablet TAKE 1 TABLET BY MOUTH DAILY AND A 2ND TABLET DAILY AS NEEDED FOR INCREASED EDEMA OR WEIGHT GAIN >3 LBS/24 HOUR 10/08/17   Mosie Lukes, MD  glucose blood (ONE TOUCH TEST STRIPS) test strip Use as instructed to check blood sugar once weekly. DX 790.29     [provider]  loratadine (CLARITIN) 10 MG tablet Take 1 tablet (10 mg total) by mouth at bedtime. Patient taking differently: Take 10 mg by mouth at bedtime as needed for allergies.  03/06/16   Magdalen Spatz, NP  metoprolol succinate (TOPROL-XL) 50 MG 24 hr tablet TAKE 1 AND 1/2 TABLETS EVERY DAY Patient taking differently: Take 75 mg by mouth daily.  04/13/17   Mosie Lukes, MD  montelukast (SINGULAIR) 10 MG tablet Take 1 tablet (10 mg total) by mouth at bedtime as needed. 10/08/15   Mosie Lukes, MD  OXYGEN Inhale 2 L into the lungs continuous.     [provider]  potassium chloride SA (K-DUR,KLOR-CON) 20 MEQ tablet Take 1 tablet (20 mEq total) by mouth See admin instructions. And a 2nd tab po daily prn extra Furosemide tab 11/16/17   Mosie Lukes, MD  pravastatin (PRAVACHOL) 20 MG tablet Take 1 tablet (20 mg total) by mouth every evening. 12/21/17 12/21/18  Sueanne Margarita, MD  Probiotic Product (PROBIOTIC PO) Take 1 tablet by mouth daily.     [provider]  sertraline (ZOLOFT) 100 MG tablet Take 1 tablet (100 mg total) by mouth daily. 10/12/17   Mosie Lukes, MD  traMADol (ULTRAM) 50 MG tablet Take 1 tablet (50 mg total) by mouth 2 (two) times daily as needed for moderate pain. 12/25/17   Mosie Lukes, MD  trolamine salicylate (ASPERCREME) 10 % cream Apply 1 application topically as needed for muscle pain.    [provider]  vitamin B-12 (CYANOCOBALAMIN) 1000 MCG tablet Take 1,000 mcg by mouth daily.     [provider]     Family History  Problem Relation Age of Onset  . Asthma Sister   . Arthritis Sister   . Hypertension Sister   . Hyperlipidemia Sister   . Uterine cancer Sister   . Coronary artery disease Brother        x2  . Arthritis Brother   . Lung cancer Brother        smoker  . Hypertension Sister   . Arthritis Sister   . Hyperlipidemia Sister   . Emphysema Sister   . COPD Sister        smoker  .  Heart disease Sister   . Hyperlipidemia Sister   . Hypertension Sister   . Arthritis Sister   . Emphysema Brother   . Heart disease Brother         smoker  . Heart attack Brother   . Mental illness Father   . Alcohol abuse Father   . Suicidality Father   . Heart disease Mother   . Hyperlipidemia Mother   . Heart attack Mother   . Epilepsy Daughter   . Hypertension Daughter   . Obesity Daughter   . COPD Brother        smoker  . Lung cancer Brother   . Coronary artery disease Other   . Colon polyps Sister     Social History   Socioeconomic History  . Marital status: Widowed    Spouse name: Not on file  . Number of children: 1  . Years of education: Not on file  . Highest education level: Not on file  Occupational History  . 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 Financial resource strain: Not on file  . Food insecurity:    Worry: Not on file    Inability: Not on file  . Transportation needs:    Medical: Not on file    Non-medical: Not on file    Tobacco Use  . Smoking status: Never Smoker  . Smokeless tobacco: Never Used  Substance and Sexual Activity  . Alcohol use: No  . Drug use: No  . Sexual activity: Never    Comment: lives alone, no dietary restrictions  Lifestyle  . Physical activity:    Days per week: Not on file    Minutes per session: Not on file  . Stress: Not on file  Relationships  . Social connections:    Talks on phone: Not on file    Gets together: Not on file    Attends religious service: Not on file    Active member of club or organization: Not on file    Attends meetings of clubs or organizations: Not on file    Relationship status: Not on file  Other Topics Concern  . Not on file  Social History Narrative  . Not on file     Review of Systems: A 12 point ROS discussed and pertinent positives are indicated in the HPI above.  All other systems are negative.  Review of Systems  Constitutional: Negative for chills and fever.  HENT: Negative for hearing loss and tinnitus.   Eyes: Negative for visual disturbance.  Respiratory: Negative for shortness of breath and wheezing.   Cardiovascular: Negative for chest pain and palpitations.  Neurological: Positive for dizziness. Negative for syncope, speech difficulty, weakness, numbness and headaches.  Psychiatric/Behavioral: Negative for behavioral problems and confusion.    Vital Signs: There were no vitals taken for this visit.  Physical Exam Constitutional:      General: She is not in acute distress.    Appearance: Normal appearance.  Pulmonary:     Effort: Pulmonary effort is normal. No respiratory distress.  Skin:    General: Skin is warm and dry.  Neurological:     Mental Status: She is alert and oriented to person, place, and time.  Psychiatric:        Mood and Affect: Mood normal.        Behavior: Behavior normal.        Thought Content: Thought content normal.        Judgment:  Judgment normal.      Imaging: Ct Head Wo  Contrast  Result Date: 12/28/2017 CLINICAL DATA:  Follow-up subdural hematoma. EXAM: CT HEAD WITHOUT CONTRAST TECHNIQUE: Contiguous axial images were obtained from the base of the skull through the vertex without intravenous contrast. COMPARISON:  MRI of the head November 05, 2017. FINDINGS: BRAIN: No intraparenchymal hemorrhage, mass effect nor midline shift. The ventricles and sulci are normal for age. Patchy supratentorial white matter hypodensities less than expected for age for patient's age, though non-specific are most compatible with chronic small vessel ischemic disease. No acute large vascular territory infarcts. No abnormal extra-axial fluid collections. Basal cisterns are patent. VASCULAR: Mild calcific atherosclerosis of the carotid siphons. SKULL: No skull fracture. No significant scalp soft tissue swelling. SINUSES/ORBITS: Trace paranasal sinus mucosal thickening. Mastoid air cells are well aerated.The included ocular globes and orbital contents are non-suspicious. Status post bilateral ocular lens implants. OTHER: None. IMPRESSION: 1. Normal noncontrast CT HEAD for age. Resolution of subdural hematomas. Electronically Signed   By: Elon Alas M.D.   On: 12/28/2017 20:35    Labs:  CBC: Recent Labs    11/05/17 0331 11/07/17 0452 11/20/17 1216 12/14/17 1311  WBC 5.1 4.8 7.1 6.8  HGB 8.8* 9.0* 10.7* 10.3*  HCT 29.0* 28.6* 32.2* 33.3*  PLT 89* 92* 170.0 147*    COAGS: No results for input(s): INR, APTT in the last 8760 hours.  BMP: Recent Labs    11/05/17 0331 11/06/17 0422 11/07/17 0452 11/20/17 1216 12/14/17 1311  NA 140 140 142 141 142  K 3.9 4.3 4.3 4.9 4.5  CL 104 107 105 101 106  CO2 '29 27 29 30 25  '$ GLUCOSE 112* 110* 105* 108* 152*  BUN 26* 26* 26* 46* 41*  CALCIUM 9.0 9.2 8.9 9.8 10.0  CREATININE 1.49* 1.33* 1.45* 1.71* 1.76*  GFRNONAA 33* 38* 34*  --  27*  GFRAA 38* 44* 39*  --  31*    LIVER FUNCTION TESTS: Recent Labs    07/16/17 1508  11/04/17 1154 11/07/17 0452 11/20/17 1216 12/14/17 1311  BILITOT 0.7 0.7  --  0.6 0.5  AST 20 21  --  10 14*  ALT 20 16  --  8 8  ALKPHOS 49 44  --  62 57  PROT 7.0 6.6  --  6.7 7.1  ALBUMIN 3.9 3.9 3.2* 4.3 4.0    TUMOR MARKERS: No results for input(s): AFPTM, CEA, CA199, CHROMGRNA in the last 8760 hours.  Assessment and Plan:  ACOM aneurysm. Reviewed imaging with patient and daughter. Brought to their attention was patient's ACOM aneurysm. Discussed risk of rupture being 1-2% per year. Next brought to their attention was that patient's hemorrhage has resolved at this time based on recent CT. Due to the small size of patient's aneurysm (approximately 2 mm) and CKD stage III, explained that the best course of management at this time is routine imaging scans to monitor for changes. Explained that if these scans show an increase in aneurysm size, then we will re-visit to discuss possible aggressive treatment.  Patient asks about driving. States her PCP informed her she cannot drive. Instructed patient to follow-up with PCP regarding driving restrictions.  Plan for follow-up MRA head (without contrast) in 6 months. Informed patient that our schedulers will call her to set up this imaging scan. Instructed patient no activities that can increase ICP, including weight lifting. Instructed patient to stay hydrated by drinking plenty of water.  All questions answered and concerns addressed.  Patient and daughter convey understanding and agree with plan.  Thank you for this interesting consult.  I greatly enjoyed meeting SHERYLE VICE and look forward to participating in their care.  A copy of this report was sent to the requesting provider on this date.  Electronically Signed: Earley Abide, PA-C 01/08/2018, 11:41 AM   I spent a total of 40 Minutes in face to face in clinical consultation, greater than 50% of which was counseling/coordinating care for ACOM aneurysm.

## 2018-01-25 ENCOUNTER — Encounter: Payer: Self-pay | Admitting: Family Medicine

## 2018-01-25 ENCOUNTER — Ambulatory Visit (INDEPENDENT_AMBULATORY_CARE_PROVIDER_SITE_OTHER): Payer: Medicare HMO | Admitting: Family Medicine

## 2018-01-25 ENCOUNTER — Ambulatory Visit (HOSPITAL_BASED_OUTPATIENT_CLINIC_OR_DEPARTMENT_OTHER)
Admission: RE | Admit: 2018-01-25 | Discharge: 2018-01-25 | Disposition: A | Discharge status: Home / Self Care | Payer: Medicare HMO | Source: Ambulatory Visit | Attending: Family Medicine | Admitting: Family Medicine

## 2018-01-25 VITALS — BP 124/62 | HR 76 | Temp 98.6°F | Resp 18 | Wt 188.8 lb

## 2018-01-25 DIAGNOSIS — Z79899 Other long term (current) drug therapy: Secondary | ICD-10-CM

## 2018-01-25 DIAGNOSIS — N183 Chronic kidney disease, stage 3 unspecified: Secondary | ICD-10-CM

## 2018-01-25 DIAGNOSIS — E118 Type 2 diabetes mellitus with unspecified complications: Secondary | ICD-10-CM | POA: Diagnosis not present

## 2018-01-25 DIAGNOSIS — J40 Bronchitis, not specified as acute or chronic: Secondary | ICD-10-CM

## 2018-01-25 DIAGNOSIS — M549 Dorsalgia, unspecified: Secondary | ICD-10-CM

## 2018-01-25 DIAGNOSIS — E782 Mixed hyperlipidemia: Secondary | ICD-10-CM | POA: Diagnosis not present

## 2018-01-25 DIAGNOSIS — M1990 Unspecified osteoarthritis, unspecified site: Secondary | ICD-10-CM | POA: Diagnosis not present

## 2018-01-25 DIAGNOSIS — J9809 Other diseases of bronchus, not elsewhere classified: Secondary | ICD-10-CM | POA: Diagnosis not present

## 2018-01-25 MED ORDER — BENZONATATE 100 MG PO CAPS: 100.0000 mg | ORAL_CAPSULE | Freq: Three times a day (TID) | ORAL | 1 refills | Status: DC | PRN

## 2018-01-25 MED ORDER — TRAMADOL HCL 50 MG PO TABS: 50.0000 mg | ORAL_TABLET | Freq: Three times a day (TID) | ORAL | 0 refills | Status: DC | PRN

## 2018-01-25 NOTE — Assessment & Plan Note (Signed)
Uses Tramadol for diffuse arthritic pain, helps but needs up to 3 doses a day. Refilled today UDS contract.

## 2018-01-25 NOTE — Patient Instructions (Addendum)
Acute Bronchitis, Adult Encouraged increased rest and hydration, add probiotics, zinc such as Coldeze or Xicam. Treat fevers as needed, vitamin C, elderberry and [place] HBP  Acute bronchitis is sudden (acute) swelling of the air tubes (bronchi) in the lungs. Acute bronchitis causes these tubes to fill with mucus, which can make it hard to breathe. It can also cause coughing or wheezing. In adults, acute bronchitis usually goes away within 2 weeks. A cough caused by bronchitis may last up to 3 weeks. Smoking, allergies, and asthma can make the condition worse. Repeated episodes of bronchitis may cause further lung problems, such as chronic obstructive pulmonary disease (COPD). What are the causes? This condition can be caused by germs and by substances that irritate the lungs, including:  Cold and flu viruses. This condition is most often caused by the same virus that causes a cold.  Bacteria.  Exposure to tobacco smoke, dust, fumes, and air pollution. What increases the risk? This condition is more likely to develop in people who:  Have close contact with someone with acute bronchitis.  Are exposed to lung irritants, such as tobacco smoke, dust, fumes, and vapors.  Have a weak immune system.  Have a respiratory condition such as asthma. What are the signs or symptoms? Symptoms of this condition include:  A cough.  Coughing up clear, yellow, or green mucus.  Wheezing.  Chest congestion.  Shortness of breath.  A fever.  Body aches.  Chills.  A sore throat. How is this diagnosed? This condition is usually diagnosed with a physical exam. During the exam, your health care provider may order tests, such as chest X-rays, to rule out other conditions. He or she may also:  Test a sample of your mucus for bacterial infection.  Check the level of oxygen in your blood. This is done to check for pneumonia.  Do a chest X-ray or lung function testing to rule out pneumonia and  other conditions.  Perform blood tests. Your health care provider will also ask about your symptoms and medical history. How is this treated? Most cases of acute bronchitis clear up over time without treatment. Your health care provider may recommend:  Drinking more fluids. Drinking more makes your mucus thinner, which may make it easier to breathe.  Taking a medicine for a fever or cough.  Taking an antibiotic medicine.  Using an inhaler to help improve shortness of breath and to control a cough.  Using a cool mist vaporizer or humidifier to make it easier to breathe. Follow these instructions at home: Medicines  Take over-the-counter and prescription medicines only as told by your health care provider.  If you were prescribed an antibiotic, take it as told by your health care provider. Do not stop taking the antibiotic even if you start to feel better. General instructions   Get plenty of rest.  Drink enough fluids to keep your urine pale yellow.  Avoid smoking and secondhand smoke. Exposure to cigarette smoke or irritating chemicals will make bronchitis worse. If you smoke and you need help quitting, ask your health care provider. Quitting smoking will help your lungs heal faster.  Use an inhaler, cool mist vaporizer, or humidifier as told by your health care provider.  Keep all follow-up visits as told by your health care provider. This is important. How is this prevented? To lower your risk of getting this condition again:  Wash your hands often with soap and water. If soap and water are not available, use hand sanitizer.  Avoid contact with people who have cold symptoms.  Try not to touch your hands to your mouth, nose, or eyes.  Make sure to get the flu shot every year. Contact a health care provider if:  Your symptoms do not improve in 2 weeks of treatment. Get help right away if:  You cough up blood.  You have chest pain.  You have severe shortness of  breath.  You become dehydrated.  You faint or keep feeling like you are going to faint.  You keep vomiting.  You have a severe headache.  Your fever or chills gets worse. This information is not intended to replace advice given to you by your health care provider. Make sure you discuss any questions you have with your health care provider. Document Released: 02/14/2004 Document Revised: 08/20/2016 Document Reviewed: 06/27/2015 Elsevier Interactive Patient Education  2019 [employer].

## 2018-01-27 ENCOUNTER — Telehealth: Payer: Self-pay

## 2018-01-27 NOTE — Progress Notes (Signed)
Subjective:    Patient ID: [name], female    DOB: 08/26/1940, 78 y.o.   MRN: [id number]  No chief complaint on file.   HPI Patient is in today for follow up on chronic medical concerns. She is noting an increase in chest congestion and head congestion over the last 3 to 4 days.  The cough is generally productive of clear phlegm but she is also noting chills, fatigue, malaise and myalgias.  She is also having ongoing back pain and notes that tramadol is somewhat helpful and does not cause any significant side effects.  No recent hospital or febrile illness. Denies CP/palp/fevers/GI or GU c/o. Taking meds as prescribed  Past Medical History:  Diagnosis Date  . Abnormal glucose tolerance test   . Abnormal TSH 09/22/2016  . ACE-inhibitor cough   . Anemia 03/12/2014  . Arthritis   . Asthma    PFT 02/06/09 FEV1 1.41 (65%), FVC 1.92 (64), FEV1% 74, TLC 3.47 (71%), DLCO 48%, +BD  . Atypical chest pain    s/p cath, Normal coronaries, Non ST elevation myocardial infarction, Rt groin pseudoaneurysm  . Bacterial vaginosis 03/12/2014  . Cellulitis 06/22/2016  . Chronic kidney disease (CKD), stage III (moderate) ([place]) 08/04/2016  . COPD (chronic obstructive pulmonary disease) ([place])   . Depression   . Diastolic heart failure ([place]) 04/07/2016  . DVT (deep venous thrombosis) ([place]) 1987  . GERD (gastroesophageal reflux disease)   . Gout   . Hypercalcemia 10/15/2014  . Hyperlipidemia   . Hypertension   . Hypoxia 10/15/2014  . Macular degeneration 04/10/2015  . Osteopenia 12/29/2006   Qualifier: Diagnosis of  By: Wynona Luna   . Pneumonia   . Polymyalgia rheumatica ([place]) 01/07/2016  . Renal insufficiency 09/22/2016  . Unspecified constipation 06/05/2013  . Vitamin D deficiency 01/01/2015    Past Surgical History:  Procedure Laterality Date  . APPENDECTOMY  1951  . TONSILLECTOMY  1950  . TUBAL LIGATION  1968    Family History  Problem Relation Age of Onset  . Asthma Sister   .  Arthritis Sister   . Hypertension Sister   . Hyperlipidemia Sister   . Uterine cancer Sister   . Coronary artery disease Brother        x2  . Arthritis Brother   . Lung cancer Brother        smoker  . Hypertension Sister   . Arthritis Sister   . Hyperlipidemia Sister   . Emphysema Sister   . COPD Sister        smoker  . Heart disease Sister   . Hyperlipidemia Sister   . Hypertension Sister   . Arthritis Sister   . Emphysema Brother   . Heart disease Brother         smoker  . Heart attack Brother   . Mental illness Father   . Alcohol abuse Father   . Suicidality Father   . Heart disease Mother   . Hyperlipidemia Mother   . Heart attack Mother   . Epilepsy Daughter   . Hypertension Daughter   . Obesity Daughter   . COPD Brother        smoker  . Lung cancer Brother   . Coronary artery disease Other   . Colon polyps Sister     Social History   Socioeconomic History  . Marital status: Widowed    Spouse name: Not on file  . Number of children: 1  . Years of  education: Not on file  . Highest education level: Not on file  Occupational History  . 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 Financial resource strain: Not on file  . Food insecurity:    Worry: Not on file    Inability: Not on file  . Transportation needs:    Medical: Not on file    Non-medical: Not on file  Tobacco Use  . Smoking status: Never Smoker  . Smokeless tobacco: Never Used  Substance and Sexual Activity  . Alcohol use: No  . Drug use: No  . Sexual activity: Never    Comment: lives alone, no dietary restrictions  Lifestyle  . Physical activity:    Days per week: Not on file    Minutes per session: Not on file  . Stress: Not on file  Relationships  . Social connections:    Talks on phone: Not on file    Gets together: Not on file    Attends religious service: Not on file    Active member of club or organization: Not on file    Attends meetings of clubs or  organizations: Not on file    Relationship status: Not on file  . Intimate partner violence:    Fear of current or ex partner: Not on file    Emotionally abused: Not on file    Physically abused: Not on file    Forced sexual activity: Not on file  Other Topics Concern  . Not on file  Social History Narrative  . Not on file    Outpatient Medications Prior to Visit  Medication Sig Dispense Refill  . acetaminophen (TYLENOL) 500 MG tablet Take 500 mg by mouth 2 (two) times daily.     Marland Kitchen albuterol (ACCUNEB) 0.63 MG/3ML nebulizer solution Take 3 mLs (0.63 mg total) by nebulization every 6 (six) hours as needed for wheezing or shortness of breath. 150 mL 0  . albuterol (PROVENTIL HFA;VENTOLIN HFA) 108 (90 Base) MCG/ACT inhaler INHALE 2 PUFFS INTO THE LUNGS 4 (FOUR) TIMES DAILY AS NEEDED FOR WHEEZING OR SHORTNESS OF BREATH. 54 g 0  . allopurinol (ZYLOPRIM) 100 MG tablet Take 200 mg by mouth daily.    Marland Kitchen b complex vitamins tablet Take 1 tablet by mouth daily.    . budesonide-formoterol (SYMBICORT) 160-4.5 MCG/ACT inhaler INHALE 2 PUFFS INTO THE LUNGS 2 TIMES DAILY (Patient taking differently: Inhale 2 puffs into the lungs 2 (two) times daily. ) 3 Inhaler 1  . cholecalciferol (VITAMIN D) 1000 units tablet Take 1,000 Units daily by mouth.    . famotidine (PEPCID) 20 MG tablet Take 1 tablet (20 mg total) by mouth daily. 90 tablet 1  . Ferrous Fumarate-Folic Acid (HEMOCYTE-F) 324-1 MG TABS Take 1 tablet by mouth daily. 90 each 1  . fluticasone (FLONASE) 50 MCG/ACT nasal spray Place 1 spray into both nostrils daily. (Patient taking differently: Place 1 spray into both nostrils daily as needed for allergies. ) 16 g 3  . furosemide (LASIX) 40 MG tablet TAKE 1 TABLET BY MOUTH DAILY AND A 2ND TABLET DAILY AS NEEDED FOR INCREASED EDEMA OR WEIGHT GAIN >3 LBS/24 HOUR 100 tablet 5  . glucose blood (ONE TOUCH TEST STRIPS) test strip Use as instructed to check blood sugar once weekly. DX 790.29     . loratadine  (CLARITIN) 10 MG tablet Take 1 tablet (10 mg total) by mouth at bedtime. (Patient taking differently: Take 10 mg by mouth at bedtime as needed for  allergies. ) 30 tablet 5  . metoprolol succinate (TOPROL-XL) 50 MG 24 hr tablet TAKE 1 AND 1/2 TABLETS EVERY DAY (Patient taking differently: Take 75 mg by mouth daily. ) 135 tablet 3  . montelukast (SINGULAIR) 10 MG tablet Take 1 tablet (10 mg total) by mouth at bedtime as needed. 30 tablet 3  . OXYGEN Inhale 2 L into the lungs continuous.     . potassium chloride SA (K-DUR,KLOR-CON) 20 MEQ tablet Take 1 tablet (20 mEq total) by mouth See admin instructions. And a 2nd tab po daily prn extra Furosemide tab 90 tablet 1  . pravastatin (PRAVACHOL) 20 MG tablet Take 1 tablet (20 mg total) by mouth every evening. 90 tablet 3  . Probiotic Product (PROBIOTIC PO) Take 1 tablet by mouth daily.     . sertraline (ZOLOFT) 100 MG tablet Take 1 tablet (100 mg total) by mouth daily. 90 tablet 1  . trolamine salicylate (ASPERCREME) 10 % cream Apply 1 application topically as needed for muscle pain.    . vitamin B-12 (CYANOCOBALAMIN) 1000 MCG tablet Take 1,000 mcg by mouth daily.     . traMADol (ULTRAM) 50 MG tablet Take 1 tablet (50 mg total) by mouth 2 (two) times daily as needed for moderate pain. 60 tablet 0   No facility-administered medications prior to visit.     Allergies  Allergen Reactions  . Oseltamivir Phosphate     TAMIFLU REACTION: nausea, vomiting, diarrhea, dizziness  . Sulfa Antibiotics Other (See Comments)    CP  . Ace Inhibitors   . Indomethacin   . Montelukast Sodium     REACTION: Heart palpitations, chest pain  . Sulfonamide Derivatives     Review of Systems  Constitutional: Positive for chills and malaise/fatigue. Negative for fever.  HENT: Positive for congestion.   Eyes: Negative for blurred vision.  Respiratory: Positive for cough. Negative for shortness of breath.   Cardiovascular: Negative for chest pain, palpitations and leg  swelling.  Gastrointestinal: Negative for abdominal pain, blood in stool and nausea.  Genitourinary: Negative for dysuria and frequency.  Musculoskeletal: Positive for back pain and myalgias. Negative for falls.  Skin: Negative for rash.  Neurological: Negative for dizziness, loss of consciousness and headaches.  Endo/Heme/Allergies: Negative for environmental allergies.  Psychiatric/Behavioral: Negative for depression. The patient is not nervous/anxious.        Objective:    Physical Exam Vitals signs and nursing note reviewed.  Constitutional:      General: She is not in acute distress.    Appearance: She is well-developed.  HENT:     Head: Normocephalic and atraumatic.     Nose: Nose normal.  Eyes:     General:        Right eye: No discharge.        Left eye: No discharge.  Neck:     Musculoskeletal: Normal range of motion and neck supple.  Cardiovascular:     Rate and Rhythm: Normal rate and regular rhythm.     Heart sounds: No murmur.  Pulmonary:     Effort: Pulmonary effort is normal.     Breath sounds: Normal breath sounds.     Comments: Decreased breath sounds bilateral bases Abdominal:     General: Bowel sounds are normal.     Palpations: Abdomen is soft.     Tenderness: There is no abdominal tenderness.  Skin:    General: Skin is warm and dry.  Neurological:     Mental Status: She is alert  and oriented to person, place, and time.     BP 124/62 (BP Location: Left Arm, Patient Position: Sitting, Cuff Size: Normal)   Pulse 76   Temp 98.6 F (37 C) (Oral)   Resp 18   Wt 188 lb 12.8 oz (85.6 kg)   SpO2 100%   BMI 31.42 kg/m  Wt Readings from Last 3 Encounters:  01/25/18 188 lb 12.8 oz (85.6 kg)  12/21/17 193 lb 12.8 oz (87.9 kg)  12/14/17 190 lb (86.2 kg)     Lab Results  Component Value Date   WBC 6.8 12/14/2017   HGB 10.3 (L) 12/14/2017   HCT 33.3 (L) 12/14/2017   PLT 147 (L) 12/14/2017   GLUCOSE 152 (H) 12/14/2017   CHOL 158 11/20/2017    TRIG 145.0 11/20/2017   HDL 44.50 11/20/2017   LDLDIRECT 73.0 06/16/2017   LDLCALC 85 11/20/2017   ALT 8 12/14/2017   AST 14 (L) 12/14/2017   NA 142 12/14/2017   K 4.5 12/14/2017   CL 106 12/14/2017   CREATININE 1.76 (H) 12/14/2017   BUN 41 (H) 12/14/2017   CO2 25 12/14/2017   TSH 2.64 11/20/2017   INR 1.0 09/12/2006   HGBA1C 5.5 11/20/2017   MICROALBUR 0.8 02/28/2014    Lab Results  Component Value Date   TSH 2.64 11/20/2017   Lab Results  Component Value Date   WBC 6.8 12/14/2017   HGB 10.3 (L) 12/14/2017   HCT 33.3 (L) 12/14/2017   MCV 103.7 (H) 12/14/2017   PLT 147 (L) 12/14/2017   Lab Results  Component Value Date   NA 142 12/14/2017   K 4.5 12/14/2017   CO2 25 12/14/2017   GLUCOSE 152 (H) 12/14/2017   BUN 41 (H) 12/14/2017   CREATININE 1.76 (H) 12/14/2017   BILITOT 0.5 12/14/2017   ALKPHOS 57 12/14/2017   AST 14 (L) 12/14/2017   ALT 8 12/14/2017   PROT 7.1 12/14/2017   ALBUMIN 4.0 12/14/2017   CALCIUM 10.0 12/14/2017   ANIONGAP 11 12/14/2017   GFR 30.80 (L) 11/20/2017   Lab Results  Component Value Date   CHOL 158 11/20/2017   Lab Results  Component Value Date   HDL 44.50 11/20/2017   Lab Results  Component Value Date   LDLCALC 85 11/20/2017   Lab Results  Component Value Date   TRIG 145.0 11/20/2017   Lab Results  Component Value Date   CHOLHDL 4 11/20/2017   Lab Results  Component Value Date   HGBA1C 5.5 11/20/2017       Assessment & Plan:   Problem List Items Addressed This Visit    Diabetes type 2, controlled ([place])    hgba1c acceptable, minimize simple carbs. Increase exercise as tolerated. Continue current meds      Hyperlipidemia, mixed    Encouraged heart healthy diet, increase exercise, avoid trans fats, consider a krill oil cap daily      Osteoarthritis    Uses Tramadol for diffuse arthritic pain, helps but needs up to 3 doses a day. Refilled today [employer] contract.       Relevant Medications   traMADol (ULTRAM) 50  MG tablet   Back pain    Encouraged moist heat and gentle stretching as tolerated. May try Tylenol and prescription meds as directed and report if symptoms worsen or seek immediate care. Allowed refill on Tramadol which she reports gives her relief      Relevant Medications   traMADol (ULTRAM) 50 MG tablet   Bronchitis  CXR shows increased markings suggestive of an acute process. Encouraged increased rest and hydration, add probiotics, zinc such as Coldeze or Xicam. Treat fevers as needed, antibiotics and mucinex      Relevant Orders   DG Chest 2 View (Completed)   Comprehensive metabolic panel   CBC w/Diff   Chronic kidney disease (CKD), stage III (moderate) ([place])    Hydrate well and monitor       Other Visit Diagnoses    High risk medication use    -  Primary   Relevant Orders   Pain Mgmt, Profile 8 w/Conf, U      I have changed Navi J. Greulich's traMADol. I am also having her start on benzonatate. Additionally, I am having her maintain her glucose blood, acetaminophen, vitamin B-12, OXYGEN, b complex vitamins, Probiotic Product (PROBIOTIC PO), montelukast, loratadine, fluticasone, Ferrous Fumarate-Folic Acid, cholecalciferol, albuterol, metoprolol succinate, albuterol, budesonide-formoterol, furosemide, sertraline, trolamine salicylate, potassium chloride SA, famotidine, allopurinol, and pravastatin.  Meds ordered this encounter  Medications  . traMADol (ULTRAM) 50 MG tablet    Sig: Take 1 tablet (50 mg total) by mouth 3 (three) times daily as needed for moderate pain.    Dispense:  90 tablet    Refill:  0  . benzonatate (TESSALON) 100 MG capsule    Sig: Take 1 capsule (100 mg total) by mouth 3 (three) times daily as needed for cough.    Dispense:  30 capsule    Refill:  1     Penni Homans, MD

## 2018-01-27 NOTE — Assessment & Plan Note (Signed)
Encouraged heart healthy diet, increase exercise, avoid trans fats, consider a krill oil cap daily 

## 2018-01-27 NOTE — Assessment & Plan Note (Signed)
CXR shows increased markings suggestive of an acute process. Encouraged increased rest and hydration, add probiotics, zinc such as Coldeze or Xicam. Treat fevers as needed, antibiotics and mucinex

## 2018-01-27 NOTE — Assessment & Plan Note (Signed)
Encouraged moist heat and gentle stretching as tolerated. May try Tylenol and prescription meds as directed and report if symptoms worsen or seek immediate care. Allowed refill on Tramadol which she reports gives her relief

## 2018-01-27 NOTE — Assessment & Plan Note (Signed)
Hydrate well and monitor 

## 2018-01-27 NOTE — Assessment & Plan Note (Signed)
hgba1c acceptable, minimize simple carbs. Increase exercise as tolerated. Continue current meds 

## 2018-01-27 NOTE — Telephone Encounter (Signed)
CRM sent to [employer], not our patient. Please advise    Copied from [place] [phone]. Topic: General - Other >> Jan 26, 2018  5:51 PM Windy Kalata wrote: Reason for CRM: Patient would like to know if she can be prescribed something other than Zantac, she states that it is causing cancer and would like something else, please advise  Best call back is [phone]

## 2018-02-01 ENCOUNTER — Other Ambulatory Visit: Payer: Medicare HMO | Admitting: *Deleted

## 2018-02-01 DIAGNOSIS — E782 Mixed hyperlipidemia: Secondary | ICD-10-CM | POA: Diagnosis not present

## 2018-02-02 LAB — HEPATIC FUNCTION PANEL
ALT: 16 IU/L (ref 0–32)
AST: 18 IU/L (ref 0–40)
Albumin: 3.9 g/dL (ref 3.5–4.8)
Alkaline Phosphatase: 68 IU/L (ref 39–117)
BILIRUBIN, DIRECT: 0.2 mg/dL (ref 0.00–0.40)
Bilirubin Total: 0.5 mg/dL (ref 0.0–1.2)
TOTAL PROTEIN: 6.6 g/dL (ref 6.0–8.5)

## 2018-02-02 LAB — LIPID PANEL
CHOL/HDL RATIO: 3.2 ratio (ref 0.0–4.4)
Cholesterol, Total: 127 mg/dL (ref 100–199)
HDL: 40 mg/dL (ref >39)
LDL CALC: 64 mg/dL (ref 0–99)
Triglycerides: 116 mg/dL (ref 0–149)
VLDL Cholesterol Cal: 23 mg/dL (ref 5–40)

## 2018-02-05 NOTE — Progress Notes (Signed)
Subjective:   [name] is a 78 y.o. female who presents for Medicare Annual (Subsequent) preventive examination.  Pt is wearing O2 at 2L  Review of Systems: No ROS.  Medicare Wellness Visit. Additional risk factors are reflected in the social history.   Sleep patterns: Falls asleep on love seat watching tv. Then goes to bed. Home Safety/Smoke Alarms: Feels safe in home. Smoke alarms in place.  Lives alone. Family lives close by. No stairs. Walk-in shower with grabrails.   Female:         Mammo-03/31/17       Dexa scan-  03/31/17      CCS-11/09/13. Recall 5 yrs Eye- Dr. Charlean Sanfilippo yearly. UTD per pt.     Objective:     Vitals: There were no vitals taken for this visit.  There is no height or weight on file to calculate BMI.  Advanced Directives 12/14/2017 11/04/2017 09/04/2017 02/05/2017 11/28/2016 02/14/2016 01/07/2016  Does Patient Have a Medical Advance Directive? _0  No No  Would patient like information on creating a medical advance directive? - Yes (Inpatient - patient requests chaplain consult to create a medical advance directive) - No - Patient declined No - Patient declined - Yes (MAU/Ambulatory/Procedural Areas - Information given)  Pre-existing out of facility DNR order (yellow form or pink MOST form) - - - - - - -    Tobacco Social History   Tobacco Use  Smoking Status Never Smoker  Smokeless Tobacco Never Used     Counseling given: Not Answered   Clinical Intake:                       Past Medical History:  Diagnosis Date  . Abnormal glucose tolerance test   . Abnormal TSH 09/22/2016  . ACE-inhibitor cough   . Anemia 03/12/2014  . Arthritis   . Asthma    PFT 02/06/09 FEV1 1.41 (65%), FVC 1.92 (64), FEV1% 74, TLC 3.47 (71%), DLCO 48%, +BD  . Atypical chest pain    s/p cath, Normal coronaries, Non ST elevation myocardial infarction, Rt groin pseudoaneurysm  . Bacterial vaginosis 03/12/2014  . Cellulitis 06/22/2016  . Chronic  kidney disease (CKD), stage III (moderate) ([place]) 08/04/2016  . COPD (chronic obstructive pulmonary disease) ([place])   . Depression   . Diastolic heart failure ([place]) 04/07/2016  . DVT (deep venous thrombosis) ([place]) 1987  . GERD (gastroesophageal reflux disease)   . Gout   . Hypercalcemia 10/15/2014  . Hyperlipidemia   . Hypertension   . Hypoxia 10/15/2014  . Macular degeneration 04/10/2015  . Osteopenia 12/29/2006   Qualifier: Diagnosis of  By: Wynona Luna   . Pneumonia   . Polymyalgia rheumatica ([place]) 01/07/2016  . Renal insufficiency 09/22/2016  . Unspecified constipation 06/05/2013  . Vitamin D deficiency 01/01/2015   Past Surgical History:  Procedure Laterality Date  . APPENDECTOMY  1951  . TONSILLECTOMY  1950  . TUBAL LIGATION  1968   Family History  Problem Relation Age of Onset  . Asthma Sister   . Arthritis Sister   . Hypertension Sister   . Hyperlipidemia Sister   . Uterine cancer Sister   . Coronary artery disease Brother        x2  . Arthritis Brother   . Lung cancer Brother        smoker  . Hypertension Sister   . Arthritis Sister   . Hyperlipidemia Sister   . Emphysema  Sister   . COPD Sister        smoker  . Heart disease Sister   . Hyperlipidemia Sister   . Hypertension Sister   . Arthritis Sister   . Emphysema Brother   . Heart disease Brother         smoker  . Heart attack Brother   . Mental illness Father   . Alcohol abuse Father   . Suicidality Father   . Heart disease Mother   . Hyperlipidemia Mother   . Heart attack Mother   . Epilepsy Daughter   . Hypertension Daughter   . Obesity Daughter   . COPD Brother        smoker  . Lung cancer Brother   . Coronary artery disease Other   . Colon polyps Sister    Social History   Socioeconomic History  . Marital status: Widowed    Spouse name: Not on file  . Number of children: 1  . Years of education: Not on file  . Highest education level: Not on file  Occupational History  .  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 Financial resource strain: Not on file  . Food insecurity:    Worry: Not on file    Inability: Not on file  . Transportation needs:    Medical: Not on file    Non-medical: Not on file  Tobacco Use  . Smoking status: Never Smoker  . Smokeless tobacco: Never Used  Substance and Sexual Activity  . Alcohol use: No  . Drug use: No  . Sexual activity: Never    Comment: lives alone, no dietary restrictions  Lifestyle  . Physical activity:    Days per week: Not on file    Minutes per session: Not on file  . Stress: Not on file  Relationships  . Social connections:    Talks on phone: Not on file    Gets together: Not on file    Attends religious service: Not on file    Active member of club or organization: Not on file    Attends meetings of clubs or organizations: Not on file    Relationship status: Not on file  Other Topics Concern  . Not on file  Social History Narrative  . Not on file    Outpatient Encounter Medications as of 02/08/2018  Medication Sig  . acetaminophen (TYLENOL) 500 MG tablet Take 500 mg by mouth 2 (two) times daily.   Marland Kitchen albuterol (ACCUNEB) 0.63 MG/3ML nebulizer solution Take 3 mLs (0.63 mg total) by nebulization every 6 (six) hours as needed for wheezing or shortness of breath.  Marland Kitchen albuterol (PROVENTIL HFA;VENTOLIN HFA) 108 (90 Base) MCG/ACT inhaler INHALE 2 PUFFS INTO THE LUNGS 4 (FOUR) TIMES DAILY AS NEEDED FOR WHEEZING OR SHORTNESS OF BREATH.  Marland Kitchen allopurinol (ZYLOPRIM) 100 MG tablet Take 200 mg by mouth daily.  Marland Kitchen b complex vitamins tablet Take 1 tablet by mouth daily.  . benzonatate (TESSALON) 100 MG capsule Take 1 capsule (100 mg total) by mouth 3 (three) times daily as needed for cough.  . budesonide-formoterol (SYMBICORT) 160-4.5 MCG/ACT inhaler INHALE 2 PUFFS INTO THE LUNGS 2 TIMES DAILY (Patient taking differently: Inhale 2 puffs into the lungs 2 (two) times daily. )  . cholecalciferol (VITAMIN  D) 1000 units tablet Take 1,000 Units daily by mouth.  . famotidine (PEPCID) 20 MG tablet Take 1 tablet (20 mg total) by mouth daily.  . Ferrous Fumarate-Folic Acid (HEMOCYTE-F)  324-1 MG TABS Take 1 tablet by mouth daily.  . fluticasone (FLONASE) 50 MCG/ACT nasal spray Place 1 spray into both nostrils daily. (Patient taking differently: Place 1 spray into both nostrils daily as needed for allergies. )  . furosemide (LASIX) 40 MG tablet TAKE 1 TABLET BY MOUTH DAILY AND A 2ND TABLET DAILY AS NEEDED FOR INCREASED EDEMA OR WEIGHT GAIN >3 LBS/24 HOUR  . glucose blood (ONE TOUCH TEST STRIPS) test strip Use as instructed to check blood sugar once weekly. DX 790.29   . loratadine (CLARITIN) 10 MG tablet Take 1 tablet (10 mg total) by mouth at bedtime. (Patient taking differently: Take 10 mg by mouth at bedtime as needed for allergies. )  . metoprolol succinate (TOPROL-XL) 50 MG 24 hr tablet TAKE 1 AND 1/2 TABLETS EVERY DAY (Patient taking differently: Take 75 mg by mouth daily. )  . montelukast (SINGULAIR) 10 MG tablet Take 1 tablet (10 mg total) by mouth at bedtime as needed.  . OXYGEN Inhale 2 L into the lungs continuous.   . potassium chloride SA (K-DUR,KLOR-CON) 20 MEQ tablet Take 1 tablet (20 mEq total) by mouth See admin instructions. And a 2nd tab po daily prn extra Furosemide tab  . pravastatin (PRAVACHOL) 20 MG tablet Take 1 tablet (20 mg total) by mouth every evening.  . Probiotic Product (PROBIOTIC PO) Take 1 tablet by mouth daily.   . sertraline (ZOLOFT) 100 MG tablet Take 1 tablet (100 mg total) by mouth daily.  . traMADol (ULTRAM) 50 MG tablet Take 1 tablet (50 mg total) by mouth 3 (three) times daily as needed for moderate pain.  Marland Kitchen trolamine salicylate (ASPERCREME) 10 % cream Apply 1 application topically as needed for muscle pain.  . vitamin B-12 (CYANOCOBALAMIN) 1000 MCG tablet Take 1,000 mcg by mouth daily.    No facility-administered encounter medications on file as of 02/08/2018.      Activities of Daily Living In your present state of health, do you have any difficulty performing the following activities: 11/04/2017 02/05/2017  Hearing? N Y  Comment - has hearing aids but does not wear them.  Vision? N N  Comment - Dr.Bernstoff yearly. Wearing glasses.  Difficulty concentrating or making decisions? N Y  Walking or climbing stairs? Y Y  Comment - has to hold to rail and SOB  Dressing or bathing? Y N  Doing errands, shopping? Y N  Preparing Food and eating ? - N  Using the Toilet? - N  In the past six months, have you accidently leaked urine? - N  Do you have problems with loss of bowel control? - N  Managing your Medications? - N  Managing your Finances? - N  Housekeeping or managing your Housekeeping? - N  Some recent data might be hidden    Patient Care Team: Mosie Lukes, MD as PCP - General (Family Medicine) Shirley Muscat, Loreen Freud, MD as Referring Physician (Optometry) Parrett, Fonnie Mu, NP as Nurse Practitioner (Pulmonary Disease) Rigoberto Noel, MD as Consulting Physician (Pulmonary Disease) Donato Heinz, MD as Consulting Physician (Nephrology)    Assessment:   This is a routine wellness examination for [name]. Physical assessment deferred to PCP.  Exercise Activities and Dietary recommendations   Diet (meal preparation, eat out, water intake, caffeinated beverages, dairy products, fruits and vegetables): 24 hr recall Breakfast: oatmeal, coffee, water. Lunch: fried chicken sandwich, mac n cheese, tea Dinner: salad with grilled chicken   Goals    . BEGIN [employer] AT LEAST TWICE PER  WEEK. (pt-stated)    . Eat more fruits and vegetables    . Go to bed at the same time every night.       Fall Risk Fall Risk  02/05/2017 11/28/2016 01/07/2016 09/25/2015 09/25/2015  Falls in the past year? No No Yes Yes -  Number falls in past yr: - - 1 1 -  Comment - - Caught toe on a step and fell forward. - -  Injury with Fall? - - - Yes Yes  Comment - - - -  patient report hurting right wrist but not seriouly injuried  Follow up - - Falls prevention discussed - -   Depression Screen PHQ 2/9 Scores 02/05/2017 11/28/2016 01/07/2016 09/25/2015  PHQ - 2 Score _0 0  PHQ- 9 Score - - 8 -     Cognitive Function MMSE - Mini Mental State Exam 02/05/2017 01/07/2016  Orientation to time 5 4  Orientation to Place 5 5  Registration 3 3  Attention/ Calculation 5 5  Recall 3 3  Language- name 2 objects 2 2  Language- repeat 1 1  Language- follow 3 step command 3 3  Language- read & follow direction 1 1  Write a sentence 1 1  Copy design 1 0  Total score 30 28        Immunization History  Administered Date(s) Administered  . Influenza Split 12/08/2011  . Influenza Whole 10/20/2006, 11/09/2007, 11/14/2008, 10/30/2009  . Influenza, High Dose Seasonal PF 09/25/2015, 10/21/2015, 10/26/2017  . Influenza,inj,Quad PF,6+ Mos 10/20/2012, 11/28/2013, 10/02/2014, 09/18/2016  . Pneumococcal Conjugate-13 02/21/2013  . Pneumococcal Polysaccharide-23 06/30/2006  . Td 06/01/2007    Screening Tests Health Maintenance  Topic Date Due  . URINE MICROALBUMIN  03/01/2015  . OPHTHALMOLOGY EXAM  10/08/2015  . FOOT EXAM  04/09/2016  . TETANUS/TDAP  05/31/2017  . HEMOGLOBIN A1C  05/21/2018  . COLONOSCOPY  11/10/2018  . INFLUENZA VACCINE  Completed  . DEXA SCAN  Completed  . PNA vac Low Risk Adult  Completed       Plan:    Please schedule your next medicare wellness visit with me in 1 yr.  Continue to eat heart healthy diet (full of fruits, vegetables, whole grains, lean protein, water--limit salt, fat, and sugar intake) and increase physical activity as tolerated.   Continue doing brain stimulating activities (puzzles, reading, adult coloring books, staying active) to keep memory sharp.     I have personally reviewed and noted the following in the patient's chart:   . Medical and social history . Use of alcohol, tobacco or illicit drugs  . Current  medications and supplements . Functional ability and status . Nutritional status . Physical activity . Advanced directives . List of other physicians . Hospitalizations, surgeries, and ER visits in previous 12 months . Vitals . Screenings to include cognitive, depression, and falls . Referrals and appointments  In addition, I have reviewed and discussed with patient certain preventive protocols, quality metrics, and best practice recommendations. A written personalized care plan for preventive services as well as general preventive health recommendations were provided to patient.     Shela Nevin, [place]  02/05/2018

## 2018-02-08 ENCOUNTER — Encounter: Payer: Self-pay | Admitting: *Deleted

## 2018-02-08 ENCOUNTER — Ambulatory Visit (INDEPENDENT_AMBULATORY_CARE_PROVIDER_SITE_OTHER): Payer: Medicare HMO | Admitting: *Deleted

## 2018-02-08 VITALS — BP 126/64 | HR 78 | Ht 65.0 in | Wt 188.4 lb

## 2018-02-08 DIAGNOSIS — Z0000 Encounter for general adult medical examination without abnormal findings: Secondary | ICD-10-CM

## 2018-02-08 NOTE — Patient Instructions (Signed)
Please schedule your next medicare wellness visit with me in 1 yr.  Continue to eat heart healthy diet (full of fruits, vegetables, whole grains, lean protein, water--limit salt, fat, and sugar intake) and increase physical activity as tolerated.   Continue doing brain stimulating activities (puzzles, reading, adult coloring books, staying active) to keep memory sharp.    [name] , Thank you for taking time to come for your Medicare Wellness Visit. I appreciate your ongoing commitment to your health goals. Please review the following plan we discussed and let me know if I can assist you in the future.   These are the goals we discussed: Goals    . BEGIN [employer] AT LEAST TWICE PER WEEK. (pt-stated)    . Eat more fruits and vegetables    . Go to bed at the same time every night.       This is a list of the screening recommended for you and due dates:  Health Maintenance  Topic Date Due  . Urine Protein Check  03/01/2015  . Eye exam for diabetics  10/08/2015  . Complete foot exam   04/09/2016  . Tetanus Vaccine  05/31/2017  . Hemoglobin A1C  05/21/2018  . Colon Cancer Screening  11/10/2018  . Flu Shot  Completed  . DEXA scan (bone density measurement)  Completed  . Pneumonia vaccines  Completed    Health Maintenance After Age 8 After age 54, you are at a higher risk for certain long-term diseases and infections as well as injuries from falls. Falls are a major cause of broken bones and head injuries in people who are older than age 59. Getting regular preventive care can help to keep you healthy and well. Preventive care includes getting regular testing and making lifestyle changes as recommended by your health care provider. Talk with your health care provider about:  Which screenings and tests you should have. A screening is a test that checks for a disease when you have no symptoms.  A diet and exercise plan that is right for you. What should I know about screenings and tests  to prevent falls? Screening and testing are the best ways to find a health problem early. Early diagnosis and treatment give you the best chance of managing medical conditions that are common after age 74. Certain conditions and lifestyle choices may make you more likely to have a fall. Your health care provider may recommend:  Regular vision checks. Poor vision and conditions such as cataracts can make you more likely to have a fall. If you wear glasses, make sure to get your prescription updated if your vision changes.  Medicine review. Work with your health care provider to regularly review all of the medicines you are taking, including over-the-counter medicines. Ask your health care provider about any side effects that may make you more likely to have a fall. Tell your health care provider if any medicines that you take make you feel dizzy or sleepy.  Osteoporosis screening. Osteoporosis is a condition that causes the bones to get weaker. This can make the bones weak and cause them to break more easily.  Blood pressure screening. Blood pressure changes and medicines to control blood pressure can make you feel dizzy.  Strength and balance checks. Your health care provider may recommend certain tests to check your strength and balance while standing, walking, or changing positions.  Foot health exam. Foot pain and numbness, as well as not wearing proper footwear, can make you more likely  to have a fall.  Depression screening. You may be more likely to have a fall if you have a fear of falling, feel emotionally low, or feel unable to do activities that you used to do.  Alcohol use screening. Using too much alcohol can affect your balance and may make you more likely to have a fall. What actions can I take to lower my risk of falls? General instructions  Talk with your health care provider about your risks for falling. Tell your health care provider if: ? You fall. Be sure to tell your health  care provider about all falls, even ones that seem minor. ? You feel dizzy, sleepy, or off-balance.  Take over-the-counter and prescription medicines only as told by your health care provider. These include any supplements.  Eat a healthy diet and maintain a healthy weight. A healthy diet includes low-fat dairy products, low-fat (lean) meats, and fiber from whole grains, beans, and lots of fruits and vegetables. Home safety  Remove any tripping hazards, such as rugs, cords, and clutter.  Install safety equipment such as grab bars in bathrooms and safety rails on stairs.  Keep rooms and walkways well-lit. Activity   Follow a regular exercise program to stay fit. This will help you maintain your balance. Ask your health care provider what types of exercise are appropriate for you.  If you need a cane or walker, use it as recommended by your health care provider.  Wear supportive shoes that have nonskid soles. Lifestyle  Do not drink alcohol if your health care provider tells you not to drink.  If you drink alcohol, limit how much you have: ? 0-1 drink a day for women. ? 0-2 drinks a day for men.  Be aware of how much alcohol is in your drink. In the [place] one drink equals one typical bottle of beer (12 oz), one-half glass of wine (5 oz), or one shot of hard liquor (1 oz).  Do not use any products that contain nicotine or tobacco, such as cigarettes and e-cigarettes. If you need help quitting, ask your health care provider. Summary  Having a healthy lifestyle and getting preventive care can help to protect your health and wellness after age 22.  Screening and testing are the best way to find a health problem early and help you avoid having a fall. Early diagnosis and treatment give you the best chance for managing medical conditions that are more common for people who are older than age 35.  Falls are a major cause of broken bones and head injuries in people who are older than age  71. Take precautions to prevent a fall at home.  Work with your health care provider to learn what changes you can make to improve your health and wellness and to prevent falls. This information is not intended to replace advice given to you by your health care provider. Make sure you discuss any questions you have with your health care provider. Document Released: 11/19/2016 Document Revised: 11/19/2016 Document Reviewed: 11/19/2016 Elsevier Interactive Patient Education  2019 [employer].

## 2018-02-08 NOTE — Progress Notes (Signed)
RN note reviewed and agree.  Kileen Lange S O'Sullivan NP 

## 2018-02-12 ENCOUNTER — Ambulatory Visit: Payer: Medicare HMO

## 2018-02-12 ENCOUNTER — Ambulatory Visit: Payer: Medicare HMO | Admitting: Family

## 2018-02-12 ENCOUNTER — Other Ambulatory Visit: Payer: Medicare HMO

## 2018-02-16 DIAGNOSIS — I129 Hypertensive chronic kidney disease with stage 1 through stage 4 chronic kidney disease, or unspecified chronic kidney disease: Secondary | ICD-10-CM | POA: Diagnosis not present

## 2018-02-16 DIAGNOSIS — I739 Peripheral vascular disease, unspecified: Secondary | ICD-10-CM | POA: Diagnosis not present

## 2018-02-16 DIAGNOSIS — Z6834 Body mass index (BMI) 34.0-34.9, adult: Secondary | ICD-10-CM | POA: Diagnosis not present

## 2018-02-16 DIAGNOSIS — I5030 Unspecified diastolic (congestive) heart failure: Secondary | ICD-10-CM | POA: Diagnosis not present

## 2018-02-16 DIAGNOSIS — N189 Chronic kidney disease, unspecified: Secondary | ICD-10-CM | POA: Diagnosis not present

## 2018-02-16 DIAGNOSIS — N179 Acute kidney failure, unspecified: Secondary | ICD-10-CM | POA: Diagnosis not present

## 2018-02-16 DIAGNOSIS — D631 Anemia in chronic kidney disease: Secondary | ICD-10-CM | POA: Diagnosis not present

## 2018-02-16 DIAGNOSIS — E1122 Type 2 diabetes mellitus with diabetic chronic kidney disease: Secondary | ICD-10-CM | POA: Diagnosis not present

## 2018-02-16 DIAGNOSIS — N184 Chronic kidney disease, stage 4 (severe): Secondary | ICD-10-CM | POA: Diagnosis not present

## 2018-02-16 DIAGNOSIS — S065X9A Traumatic subdural hemorrhage with loss of consciousness of unspecified duration, initial encounter: Secondary | ICD-10-CM | POA: Diagnosis not present

## 2018-02-18 ENCOUNTER — Encounter: Payer: Self-pay | Admitting: Family

## 2018-02-18 ENCOUNTER — Inpatient Hospital Stay: Payer: Medicare HMO | Attending: Family

## 2018-02-18 ENCOUNTER — Telehealth: Payer: Self-pay | Admitting: Family

## 2018-02-18 ENCOUNTER — Inpatient Hospital Stay (HOSPITAL_BASED_OUTPATIENT_CLINIC_OR_DEPARTMENT_OTHER): Payer: Medicare HMO | Admitting: Family

## 2018-02-18 ENCOUNTER — Inpatient Hospital Stay: Payer: Medicare HMO

## 2018-02-18 VITALS — BP 114/52 | HR 72 | Temp 98.1°F | Resp 18 | Ht 63.0 in | Wt 189.1 lb

## 2018-02-18 DIAGNOSIS — N183 Chronic kidney disease, stage 3 unspecified: Secondary | ICD-10-CM

## 2018-02-18 DIAGNOSIS — Z9981 Dependence on supplemental oxygen: Secondary | ICD-10-CM | POA: Diagnosis not present

## 2018-02-18 DIAGNOSIS — Z79899 Other long term (current) drug therapy: Secondary | ICD-10-CM

## 2018-02-18 DIAGNOSIS — D631 Anemia in chronic kidney disease: Secondary | ICD-10-CM | POA: Insufficient documentation

## 2018-02-18 DIAGNOSIS — D508 Other iron deficiency anemias: Secondary | ICD-10-CM

## 2018-02-18 LAB — CMP (CANCER CENTER ONLY)
ALT: 9 U/L (ref 0–44)
AST: 13 U/L — ABNORMAL LOW (ref 15–41)
Albumin: 4.1 g/dL (ref 3.5–5.0)
Alkaline Phosphatase: 52 U/L (ref 38–126)
Anion gap: 9 (ref 5–15)
BUN: 38 mg/dL — ABNORMAL HIGH (ref 8–23)
CHLORIDE: 103 mmol/L (ref 98–111)
CO2: 31 mmol/L (ref 22–32)
CREATININE: 1.75 mg/dL — AB (ref 0.44–1.00)
Calcium: 9.7 mg/dL (ref 8.9–10.3)
GFR, EST NON AFRICAN AMERICAN: 28 mL/min — AB (ref >60)
GFR, Est AFR Am: 32 mL/min — ABNORMAL LOW (ref >60)
Glucose, Bld: 135 mg/dL — ABNORMAL HIGH (ref 70–99)
Potassium: 4.5 mmol/L (ref 3.5–5.1)
Sodium: 143 mmol/L (ref 135–145)
Total Bilirubin: 0.5 mg/dL (ref 0.3–1.2)
Total Protein: 6.7 g/dL (ref 6.5–8.1)

## 2018-02-18 LAB — CBC WITH DIFFERENTIAL (CANCER CENTER ONLY)
Abs Immature Granulocytes: 0.03 10*3/uL (ref 0.00–0.07)
BASOS PCT: 1 %
Basophils Absolute: 0 10*3/uL (ref 0.0–0.1)
Eosinophils Absolute: 0.4 10*3/uL (ref 0.0–0.5)
Eosinophils Relative: 6 %
HCT: 34.6 % — ABNORMAL LOW (ref 36.0–46.0)
Hemoglobin: 10.4 g/dL — ABNORMAL LOW (ref 12.0–15.0)
Immature Granulocytes: 0 %
Lymphocytes Relative: 14 %
Lymphs Abs: 1 10*3/uL (ref 0.7–4.0)
MCH: 30.5 pg (ref 26.0–34.0)
MCHC: 30.1 g/dL (ref 30.0–36.0)
MCV: 101.5 fL — ABNORMAL HIGH (ref 80.0–100.0)
MONOS PCT: 11 %
Monocytes Absolute: 0.8 10*3/uL (ref 0.1–1.0)
Neutro Abs: 5 10*3/uL (ref 1.7–7.7)
Neutrophils Relative %: 68 %
Platelet Count: 144 10*3/uL — ABNORMAL LOW (ref 150–400)
RBC: 3.41 MIL/uL — ABNORMAL LOW (ref 3.87–5.11)
RDW: 14.2 % (ref 11.5–15.5)
WBC Count: 7.2 10*3/uL (ref 4.0–10.5)
nRBC: 0 % (ref 0.0–0.2)

## 2018-02-18 LAB — SAVE SMEAR(SSMR), FOR PROVIDER SLIDE REVIEW

## 2018-02-18 MED ORDER — DARBEPOETIN ALFA 300 MCG/0.6ML IJ SOSY
300.0000 ug | PREFILLED_SYRINGE | Freq: Once | INTRAMUSCULAR | Status: AC
  Administered 2018-02-18: 300 ug via SUBCUTANEOUS

## 2018-02-18 MED ORDER — DARBEPOETIN ALFA 300 MCG/0.6ML IJ SOSY
PREFILLED_SYRINGE | INTRAMUSCULAR | Status: AC
  Filled 2018-02-18: qty 0.6

## 2018-02-18 NOTE — Telephone Encounter (Signed)
Appointments scheduled letter/calendar mailed per 1/30 los

## 2018-02-18 NOTE — Patient Instructions (Signed)

## 2018-02-18 NOTE — Progress Notes (Signed)
Hematology and Oncology Follow Up Visit  [name] [id number] [birth year] 78 y.o. 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 She is doing well but has had some mild fatigue. Hgb is 10.2, MCV 101.  She takes a nap around lunch time each day.  She is doing well on 2L supplemental O2 24 hours a day. She feels that her SOB is stable. She uses her nebulizer at home when needed/  She will have an occasional cough with clear phlegm.  No fever, chills, n/v, dizziness, chest pain, palpitations, abdominal pain or changes in bowel or bladder habits.  She takes a Dulcolax as needed for constipation.  No episodes of bleeding, no bruising or petechiae.  No tenderness, numbness or tingling in her extremities at this time.  She feels that the chronic swelling in her lower extremities is improved. Pedal pulses are 2+. She takes lasix daily to help reduce the fluid retention.  She sleeps in a recliner which she feels helps reduce swelling as well.  No falls or syncopal episodes to report.  No lymphadenopathy noted on exam.  She has maintained a good appetite and is staying well hydrated. Her weight is stable.   ECOG Performance Status: 1 - Symptomatic but completely ambulatory  Medications:  Allergies as of 02/18/2018      Reactions   Oseltamivir Phosphate    TAMIFLU REACTION: nausea, vomiting, diarrhea, dizziness   Sulfa Antibiotics Other (See Comments)   CP   Ace Inhibitors    Indomethacin    Montelukast Sodium    REACTION: Heart palpitations, chest pain   Sulfonamide Derivatives       Medication List       Accurate as of February 18, 2018  1:22 PM. Always use your most recent med list.        acetaminophen 500 MG tablet Commonly known as:  TYLENOL Take 500 mg by mouth 2 (two) times daily.   albuterol 0.63  MG/3ML nebulizer solution Commonly known as:  ACCUNEB Take 3 mLs (0.63 mg total) by nebulization every 6 (six) hours as needed for wheezing or shortness of breath.   albuterol 108 (90 Base) MCG/ACT inhaler Commonly known as:  PROVENTIL HFA;VENTOLIN HFA INHALE 2 PUFFS INTO THE LUNGS 4 (FOUR) TIMES DAILY AS NEEDED FOR WHEEZING OR SHORTNESS OF BREATH.   allopurinol 100 MG tablet Commonly known as:  ZYLOPRIM Take 200 mg by mouth daily.   b complex vitamins tablet Take 1 tablet by mouth daily.   benzonatate 100 MG capsule Commonly known as:  TESSALON Take 1 capsule (100 mg total) by mouth 3 (three) times daily as needed for cough.   budesonide-formoterol 160-4.5 MCG/ACT inhaler Commonly known as:  SYMBICORT INHALE 2 PUFFS INTO THE LUNGS 2 TIMES DAILY   cholecalciferol 1000 units tablet Commonly known as:  VITAMIN D Take 1,000 Units daily by mouth.   famotidine 20 MG tablet Commonly known as:  PEPCID Take 1 tablet (20 mg total) by mouth daily.   Ferrous Fumarate-Folic Acid 657-8 MG Tabs Commonly known as:  HEMOCYTE-F Take 1 tablet by mouth daily.   fluticasone 50 MCG/ACT nasal spray Commonly known as:  FLONASE Place 1 spray into both nostrils daily.   furosemide 40 MG tablet Commonly known as:  LASIX TAKE 1 TABLET BY MOUTH DAILY AND  A 2ND TABLET DAILY AS NEEDED FOR INCREASED EDEMA OR WEIGHT GAIN >3 LBS/24 HOUR   loratadine 10 MG tablet Commonly known as:  CLARITIN Take 1 tablet (10 mg total) by mouth at bedtime.   metoprolol succinate 50 MG 24 hr tablet Commonly known as:  TOPROL-XL TAKE 1 AND 1/2 TABLETS EVERY DAY   montelukast 10 MG tablet Commonly known as:  SINGULAIR Take 1 tablet (10 mg total) by mouth at bedtime as needed.   ONE TOUCH TEST STRIPS test strip Generic drug:  glucose blood Use as instructed to check blood sugar once weekly. DX 790.29   OXYGEN Inhale 2 L into the lungs continuous.   potassium chloride SA 20 MEQ tablet Commonly known as:   K-DUR,KLOR-CON Take 1 tablet (20 mEq total) by mouth See admin instructions. And a 2nd tab po daily prn extra Furosemide tab   pravastatin 20 MG tablet Commonly known as:  PRAVACHOL Take 1 tablet (20 mg total) by mouth every evening.   PROBIOTIC PO Take 1 tablet by mouth daily.   sertraline 100 MG tablet Commonly known as:  ZOLOFT Take 1 tablet (100 mg total) by mouth daily.   traMADol 50 MG tablet Commonly known as:  ULTRAM Take 1 tablet (50 mg total) by mouth 3 (three) times daily as needed for moderate pain.   trolamine salicylate 10 % cream Commonly known as:  ASPERCREME Apply 1 application topically as needed for muscle pain.   vitamin B-12 1000 MCG tablet Commonly known as:  CYANOCOBALAMIN Take 1,000 mcg by mouth daily.       Allergies:  Allergies  Allergen Reactions  . Oseltamivir Phosphate     TAMIFLU REACTION: nausea, vomiting, diarrhea, dizziness  . Sulfa Antibiotics Other (See Comments)    CP  . Ace Inhibitors   . Indomethacin   . Montelukast Sodium     REACTION: Heart palpitations, chest pain  . Sulfonamide Derivatives     Past Medical History, Surgical history, Social history, and Family History were reviewed and updated.  Review of Systems: All other 10 point review of systems is negative.   Physical Exam:  vitals were not taken for this visit.   Wt Readings from Last 3 Encounters:  02/08/18 188 lb 6.4 oz (85.5 kg)  01/25/18 188 lb 12.8 oz (85.6 kg)  12/21/17 193 lb 12.8 oz (87.9 kg)    Ocular: Sclerae unicteric, pupils equal, round and reactive to light Ear-nose-throat: Oropharynx clear, dentition fair Lymphatic: No cervical, supraclavicular or axillary adenopathy Lungs no rales or rhonchi, good excursion bilaterally Heart regular rate and rhythm, no murmur appreciated Abd soft, nontender, positive bowel sounds, no liver or spleen tip palpated on exam, no fluid wave MSK no focal spinal tenderness, no joint edema Neuro: non-focal,  well-oriented, appropriate affect Breasts: Deferred   Lab Results  Component Value Date   WBC 6.8 12/14/2017   HGB 10.3 (L) 12/14/2017   HCT 33.3 (L) 12/14/2017   MCV 103.7 (H) 12/14/2017   PLT 147 (L) 12/14/2017   Lab Results  Component Value Date   FERRITIN 424 (H) 12/14/2017   IRON 84 12/14/2017   TIBC 251 12/14/2017   UIBC 168 12/14/2017   IRONPCTSAT 33 12/14/2017   Lab Results  Component Value Date   RETICCTPCT 1.2 10/16/2017   RBC 3.21 (L) 12/14/2017   No results found for: KPAFRELGTCHN, LAMBDASER, KAPLAMBRATIO No results found for: IGGSERUM, IGA, IGMSERUM No results found for: TOTALPROTELP, ALBUMINELP, A1GS, A2GS, BETS, BETA2SER, [place], MSPIKE, SPEI  Chemistry      Component Value Date/Time   NA 142 12/14/2017 1311   NA 143 07/21/2017 1501   K 4.5 12/14/2017 1311   CL 106 12/14/2017 1311   CO2 25 12/14/2017 1311   BUN 41 (H) 12/14/2017 1311   BUN 41 (H) 07/21/2017 1501   CREATININE 1.76 (H) 12/14/2017 1311   CREATININE 1.36 (H) 08/23/2013 1134      Component Value Date/Time   CALCIUM 10.0 12/14/2017 1311   ALKPHOS 68 02/01/2018 1256   AST 18 02/01/2018 1256   AST 14 (L) 12/14/2017 1311   ALT 16 02/01/2018 1256   ALT 8 12/14/2017 1311   BILITOT 0.5 02/01/2018 1256   BILITOT 0.5 12/14/2017 1311       Impression and Plan: Ms. Lanpher is a very pleasant 78 yo caucasian female with multifactorial anemia.  Hgb is 10.2 so she was given Aranesp today.  We will see her back in another 2 months for follow-up.  Iron studies are pending.  She will contact our office with any questions or concerns. We can certainly see her sooner if need be.   Laverna Peace, NP 1/30/20201:22 PM

## 2018-02-19 LAB — IRON AND TIBC
Iron: 84 ug/dL (ref 41–142)
Saturation Ratios: 34 % (ref 21–57)
TIBC: 248 ug/dL (ref 236–444)
UIBC: 164 ug/dL (ref 120–384)

## 2018-02-19 LAB — FERRITIN: Ferritin: 414 ng/mL — ABNORMAL HIGH (ref 11–307)

## 2018-02-22 ENCOUNTER — Encounter: Payer: Self-pay | Admitting: Internal Medicine

## 2018-02-22 ENCOUNTER — Ambulatory Visit (INDEPENDENT_AMBULATORY_CARE_PROVIDER_SITE_OTHER): Payer: Medicare HMO | Admitting: Internal Medicine

## 2018-02-22 VITALS — BP 112/68 | HR 83 | Ht 65.0 in | Wt 197.0 lb

## 2018-02-22 DIAGNOSIS — J4530 Mild persistent asthma, uncomplicated: Secondary | ICD-10-CM

## 2018-02-22 DIAGNOSIS — J9611 Chronic respiratory failure with hypoxia: Secondary | ICD-10-CM

## 2018-02-22 DIAGNOSIS — R0602 Shortness of breath: Secondary | ICD-10-CM | POA: Diagnosis not present

## 2018-02-22 DIAGNOSIS — J9612 Chronic respiratory failure with hypercapnia: Secondary | ICD-10-CM

## 2018-02-22 MED ORDER — MONTELUKAST SODIUM 10 MG PO TABS: 10.0000 mg | ORAL_TABLET | Freq: Every day | ORAL | 11 refills | Status: DC

## 2018-02-22 NOTE — Progress Notes (Signed)
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 Consult    Shortness of breath  Dyspnea:  MMRC2 = can't walk a nl pace on a flat grade s sob but does fine slow and flat only a little better with 02  Cough: constant sense something stuck in throat but min mucoid production - worse with exp to smoke or strong scents  Sleep: 45 degrees with electric bed  - lower levels has more trouble breathng SABA use: increased use since d/c'd singulair    No obvious day to day or daytime variability or assoc excess/ purulent sputum or mucus plugs or hemoptysis or cp or chest tightness, subjective wheeze or overt sinus or hb symptoms.   Sleeping as above without nocturnal  or early am exacerbation  of respiratory  c/o's or need for noct saba. Also denies any obvious fluctuation of symptoms with weather or environmental changes or other aggravating or alleviating factors except as outlined above   No unusual exposure hx or h/o childhood pna/ asthma or knowledge of premature birth.  Current Allergies, Complete Past Medical History, Past Surgical History, Family History, and Social History were reviewed in [employer] record.  ROS  The following are not active complaints unless bolded Hoarseness, sore throat, dysphagia, dental problems, itching, sneezing,  nasal congestion or discharge of excess mucus or purulent  secretions, ear ache,   fever, chills, sweats, unintended wt loss or wt gain, classically pleuritic or exertional cp,  orthopnea pnd or arm/hand swelling  or leg swelling, presyncope, palpitations, abdominal pain, anorexia, nausea, vomiting, diarrhea  or change in bowel habits or change in bladder habits, change in stools or change in urine, dysuria, hematuria,  rash, arthralgias, visual complaints, headache, numbness, weakness or ataxia or problems with walking or coordination,  change in mood or  memory.           Past Medical History:  Diagnosis Date  . Abnormal glucose tolerance test   . Abnormal TSH 09/22/2016  . ACE-inhibitor cough   . Anemia 03/12/2014  . Arthritis   . Asthma    PFT 02/06/09 FEV1 1.41 (65%), FVC 1.92 (64), FEV1% 74, TLC 3.47 (71%), DLCO 48%, +BD  . Atypical chest pain    s/p cath, Normal coronaries, Non ST elevation myocardial infarction, Rt groin pseudoaneurysm  . Bacterial vaginosis 03/12/2014  . Cellulitis 06/22/2016  . Chronic kidney disease (CKD), stage III (moderate) ([place]) 08/04/2016  . COPD (chronic obstructive pulmonary disease) ([place])   . Depression   . Diastolic heart failure ([place]) 04/07/2016  . DVT (deep venous thrombosis) ([place]) 1987  . GERD (gastroesophageal reflux disease)   . Gout   . Hypercalcemia 10/15/2014  . Hyperlipidemia   . Hypertension   . Hypoxia 10/15/2014  . Macular degeneration 04/10/2015  . Osteopenia 12/29/2006   Qualifier: Diagnosis of  By: Shawna Orleans DO, D.  Robert   . Pneumonia   . Polymyalgia rheumatica ([place]) 01/07/2016  . Renal insufficiency 09/22/2016  . Unspecified constipation 06/05/2013  . Vitamin D deficiency 01/01/2015    Outpatient Medications Prior to Visit  Singulair 10 mg q pm  Medication Sig Dispense Refill  . acetaminophen (TYLENOL) 500 MG tablet Take 500 mg by mouth 2 (two) times daily.     Marland Kitchen albuterol (ACCUNEB) 0.63 MG/3ML nebulizer solution Take 3 mLs (0.63 mg total) by nebulization every 6 (six) hours as needed for wheezing or  shortness of breath. 150 mL 0  . albuterol (PROVENTIL HFA;VENTOLIN HFA) 108 (90 Base) MCG/ACT inhaler INHALE 2 PUFFS INTO THE LUNGS 4 (FOUR) TIMES DAILY AS NEEDED FOR WHEEZING OR SHORTNESS OF BREATH. 54 g 0  . allopurinol (ZYLOPRIM) 100 MG tablet Take 200 mg by mouth daily.    Marland Kitchen b complex vitamins tablet Take 1 tablet by mouth daily.    . benzonatate (TESSALON) 100 MG capsule Take 1 capsule (100 mg total) by mouth 3 (three) times daily as needed for cough. 30 capsule 1  . budesonide-formoterol (SYMBICORT) 160-4.5 MCG/ACT inhaler INHALE 2 PUFFS INTO THE LUNGS 2 TIMES DAILY (Patient taking differently: Inhale 2 puffs into the lungs 2 (two) times daily. ) 3 Inhaler 1  . cholecalciferol (VITAMIN D) 1000 units tablet Take 1,000 Units daily by mouth.    . famotidine (PEPCID) 20 MG tablet Take 1 tablet (20 mg total) by mouth daily. 90 tablet 1  . Ferrous Fumarate-Folic Acid (HEMOCYTE-F) 324-1 MG TABS Take 1 tablet by mouth daily. 90 each 1  . fluticasone (FLONASE) 50 MCG/ACT nasal spray Place 1 spray into both nostrils daily. (Patient taking differently: Place 1 spray into both nostrils daily as needed for allergies. ) 16 g 3  . furosemide (LASIX) 40 MG tablet TAKE 1 TABLET BY MOUTH DAILY AND A 2ND TABLET DAILY AS NEEDED FOR INCREASED EDEMA OR WEIGHT GAIN >3 LBS/24 HOUR 100 tablet 5  . glucose blood (ONE TOUCH TEST STRIPS) test strip Use as instructed to check blood sugar once weekly. DX 790.29     . loratadine (CLARITIN) 10 MG tablet Take 1 tablet (10 mg total) by mouth at bedtime. (Patient taking differently: Take 10 mg by mouth at bedtime as needed for allergies. ) 30 tablet 5  . metoprolol succinate (TOPROL-XL) 50 MG 24 hr tablet TAKE 1 AND 1/2 TABLETS EVERY DAY (Patient taking differently: Take 75 mg by mouth daily. ) 135 tablet 3  .     3  . OXYGEN Inhale 2 L into the lungs continuous.     . potassium chloride SA (K-DUR,KLOR-CON) 20 MEQ tablet Take 1 tablet (20 mEq total) by mouth See admin  instructions. And a 2nd tab po daily prn extra Furosemide tab 90 tablet 1  . pravastatin (PRAVACHOL) 20 MG tablet Take 1 tablet (20 mg total) by mouth every evening. 90 tablet 3  . Probiotic Product (PROBIOTIC PO) Take 1 tablet by mouth daily.     . sertraline (ZOLOFT) 100 MG tablet Take 1 tablet (100 mg total) by mouth daily. 90 tablet 1  . traMADol (ULTRAM) 50 MG tablet Take 1 tablet (50 mg total) by mouth 3 (three) times daily as needed for moderate pain. 90 tablet 0  . trolamine salicylate (ASPERCREME) 10 % cream Apply 1 application topically as needed for muscle pain.    . vitamin B-12 (CYANOCOBALAMIN) 1000 MCG tablet Take 1,000 mcg by mouth daily.  Objective:     BP 112/68 (BP Location: Left Arm, Patient Position: Sitting, Cuff Size: Normal)   Pulse 83   Ht 5\' 5"  (1.651 m)   Wt 197 lb (89.4 kg)   SpO2 94% Comment: 2L O2 pulse  BMI 32.78 kg/m   SpO2: 94 %(2L O2 pulse)  2lpm continous   amb elderly wf nad  HEENT: top denture/ nl turbinates bilaterally, and oropharynx. Nl external ear canals without cough reflex   NECK :  without JVD/Nodes/TM/ nl carotid upstrokes bilaterally   LUNGS: no acc muscle use,  Mild kyphotic  chest wall,  which is clear to A and P bilaterally without cough on insp or exp maneuvers   CV:  RRR  no s3 or murmur or increase in P2, and no edema   ABD:  soft and nontender with nl inspiratory excursion in the supine position. No bruits or organomegaly appreciated, bowel sounds nl  MS:  Nl gait/ ext warm without deformities, calf tenderness, cyanosis or clubbing No obvious joint restrictions   SKIN: warm and dry without lesions    NEURO:  alert, approp, nl sensorium with  no motor or cerebellar deficits apparent.     I personally reviewed images and   impression as follows:  CXR:   01/25/2018 Non specific increase in lung markings/ chronically elevated LHD, mild T kyphosis      CT Sinuses on CT head 12/28/17 Trace paranasal sinus mucosal  thickening      Assessment   Chronic asthma, mild persistent, uncomplicated PFT's  01/28/2991  FEV1 1.47 (67 % ) ratio 0.77  p no % improvement from saba p nothing prior to study with DLCO  64 % corrects to 77 % for alv volume  - 02/22/2018  After extensive coaching inhaler device,  effectiveness =    75% from a baseline of < 25% so rec continue symbicort 160 2bid    Most of her problem is restrictive but there is enough curvature on prior f/v to support a dx of co-existing asthma and it's the most treatable of her problems so rec fair trial of symbicort before moving on to alternatives and returning for repeat pfts in 4 weeks      Shortness of breath Onset around 2011 - Echo 05/09/2016 Left ventricle: The cavity size was normal. Wall thickness was   normal. Systolic function was normal. The estimated ejection   fraction was in the range of 50% to 55%. Wall motion was normal;   there were no regional wall motion abnormalities. Doppler   parameters are consistent with abnormal left ventricular   relaxation (grade 1 diastolic dysfunction). - Mitral valve: Calcified annulus. There was mild regurgitation. - Left atrium: The atrium was mildly dilated.  So doe likely multifactorial, need to keep bp as low as tol in this setting / defer to PCP/ cards      Chronic respiratory failure with hypoxia and hypercapnia ([place]) HCO3   02/18/2018   = 31  02/22/2018  Room Air at Rest = 88%  >>> while Ambulating = 87% and  on 3 Liters of oxygen while Ambulating = 94%  Since she has tendency to retain C02 goal is to keep sats in low 90's so rec 2lpm sitting/ sleeping and 3 lpm walking      Total time devoted to counseling  > 50 % of initial 60 min office visit:  review case with pt/personally observing 02 walking study/  discussion of options/alternatives/ personally creating written customized instructions  in presence of pt  then going over those specific  Instructions directly with the pt including how  to use all of the meds but in particular covering each new medication in detail and the difference between the maintenance= "automatic" meds and the prns using an action plan format for the latter (If this problem/symptom => do that organization reading Left to right).  Please see AVS from this visit for a full list of these instructions which I personally wrote for this pt and  are unique to this visit.      Christinia Gully, MD 02/22/2018

## 2018-02-22 NOTE — Patient Instructions (Addendum)
Plan A = Automatic = Symbicort 160 Take 2 puffs first thing in am and then another 2 puffs about 12 hours later.     Work on inhaler technique:  relax and gently blow all the way out then take a nice smooth deep breath back in, triggering the inhaler at same time you start breathing in.  Hold for up to 5 seconds if you can. Blow symbicort  out thru nose. Rinse and gargle with water when done      Plan B = Backup Only use your albuterol inhaler as a rescue medication to be used if you can't catch your breath by resting or doing a relaxed purse lip breathing pattern.  - The less you use it, the better it will work when you need it. - Ok to use the inhaler up to 2 puffs  every 4 hours if you must but call for appointment if use goes up over your usual need - Don't leave home without it !!  (think of it like the spare tire for your car)   Plan C = Crisis - only use your albuterol nebulizer if you first try Plan B and it fails to help > ok to use the nebulizer up to every 4 hours but if start needing it regularly call for immediate appointment  Goal is to keep your 02 saturations above 90%   Please schedule a follow up office visit in 4 weeks, sooner if needed with pfts on return

## 2018-02-23 ENCOUNTER — Encounter: Payer: Self-pay | Admitting: Internal Medicine

## 2018-02-23 DIAGNOSIS — J4530 Mild persistent asthma, uncomplicated: Secondary | ICD-10-CM | POA: Insufficient documentation

## 2018-02-23 NOTE — Assessment & Plan Note (Signed)
Onset around 2011 - Echo 05/09/2016 Left ventricle: The cavity size was normal. Wall thickness was   normal. Systolic function was normal. The estimated ejection   fraction was in the range of 50% to 55%. Wall motion was normal;   there were no regional wall motion abnormalities. Doppler   parameters are consistent with abnormal left ventricular   relaxation (grade 1 diastolic dysfunction). - Mitral valve: Calcified annulus. There was mild regurgitation. - Left atrium: The atrium was mildly dilated.   So doe likely multifactorial, need to keep bp as low as tol in this setting / defer to PCP/ cards

## 2018-02-23 NOTE — Assessment & Plan Note (Signed)
HCO3   02/18/2018   = 31  02/22/2018  Room Air at Rest = 88%  >>> while Ambulating = 87% and  on 3 Liters of oxygen while Ambulating = 94%   Since she has tendency to retain C02 goal is to keep sats in low 90's so rec 2lpm sitting/ sleeping and 3 lpm walking     Total time devoted to counseling  > 50 % of initial 60 min office visit:  review case with pt/personally observing 02 walking study/  discussion of options/alternatives/ personally creating written customized instructions  in presence of pt  then going over those specific  Instructions directly with the pt including how to use all of the meds but in particular covering each new medication in detail and the difference between the maintenance= "automatic" meds and the prns using an action plan format for the latter (If this problem/symptom => do that organization reading Left to right).  Please see AVS from this visit for a full list of these instructions which I personally wrote for this pt and  are unique to this visit.

## 2018-02-23 NOTE — Assessment & Plan Note (Signed)
PFT's  02/22/2018  FEV1 1.47 (67 % ) ratio 0.77  p no % improvement from saba p nothing prior to study with DLCO  64 % corrects to 77 % for alv volume  - 02/22/2018  After extensive coaching inhaler device,  effectiveness =    75% from a baseline of < 25% so rec continue symbicort 160 2bid     Most of her problem is restrictive but there is enough curvature on prior f/v to support a dx of co-existing asthma and it's the most treatable of her problems so rec fair trial of symbicort before moving on to alternatives and returning for repeat pfts in 4 weeks

## 2018-03-22 ENCOUNTER — Ambulatory Visit (INDEPENDENT_AMBULATORY_CARE_PROVIDER_SITE_OTHER): Payer: Medicare HMO | Admitting: Internal Medicine

## 2018-03-22 ENCOUNTER — Ambulatory Visit: Payer: Medicare HMO | Admitting: Internal Medicine

## 2018-03-22 ENCOUNTER — Encounter: Payer: Self-pay | Admitting: Internal Medicine

## 2018-03-22 DIAGNOSIS — J9612 Chronic respiratory failure with hypercapnia: Secondary | ICD-10-CM

## 2018-03-22 DIAGNOSIS — J9611 Chronic respiratory failure with hypoxia: Secondary | ICD-10-CM

## 2018-03-22 DIAGNOSIS — R0602 Shortness of breath: Secondary | ICD-10-CM

## 2018-03-22 DIAGNOSIS — J4530 Mild persistent asthma, uncomplicated: Secondary | ICD-10-CM | POA: Diagnosis not present

## 2018-03-22 LAB — PULMONARY FUNCTION TEST
DL/VA % pred: 90 %
DL/VA: 3.72 ml/min/mmHg/L
DLCO UNC % PRED: 53 %
DLCO unc: 10.01 ml/min/mmHg
FEF 25-75 Post: 0.6 L/sec
FEF 25-75 Pre: 0.67 L/sec
FEF2575-%Change-Post: -9 %
FEF2575-%Pred-Post: 38 %
FEF2575-%Pred-Pre: 42 %
FEV1-%CHANGE-POST: 0 %
FEV1-%Pred-Post: 54 %
FEV1-%Pred-Pre: 54 %
FEV1-Post: 1.11 L
FEV1-Pre: 1.11 L
FEV1FVC-%Change-Post: 1 %
FEV1FVC-%PRED-PRE: 94 %
FEV6-%Change-Post: -1 %
FEV6-%Pred-Post: 60 %
FEV6-%Pred-Pre: 61 %
FEV6-PRE: 1.58 L
FEV6-Post: 1.56 L
FEV6FVC-%PRED-PRE: 105 %
FEV6FVC-%Pred-Post: 105 %
FVC-%Change-Post: -1 %
FVC-%Pred-Post: 57 %
FVC-%Pred-Pre: 58 %
FVC-Post: 1.56 L
FVC-Pre: 1.58 L
POST FEV1/FVC RATIO: 71 %
Post FEV6/FVC ratio: 100 %
Pre FEV1/FVC ratio: 70 %
Pre FEV6/FVC Ratio: 100 %
RV % pred: 100 %
RV: 2.29 L
TLC % pred: 79 %
TLC: 3.98 L

## 2018-03-22 MED ORDER — BUDESONIDE-FORMOTEROL FUMARATE 160-4.5 MCG/ACT IN AERO: 2.0000 | INHALATION_SPRAY | Freq: Two times a day (BID) | RESPIRATORY_TRACT | 0 refills | Status: DC

## 2018-03-22 NOTE — Patient Instructions (Addendum)
Plan A = Automatic = Symbicort 160 Take 2 puffs first thing in am and then another 2 puffs about 12 hours later.   Work on inhaler technique:  relax and gently blow all the way out then take a nice smooth deep breath back in, triggering the inhaler at same time you start breathing in.  Hold for up to 5 seconds if you can. Blow symbicort  out thru nose. Rinse and gargle with water when done    Plan B = Backup Only use your albuterol inhaler as a rescue medication to be used if you can't catch your breath by resting or doing a relaxed purse lip breathing pattern.  - The less you use it, the better it will work when you need it. - Ok to use the inhaler up to 2 puffs  every 4 hours if you must but call for appointment if use goes up over your usual need - Don't leave home without it !!  (think of it like the spare tire for your car)   Plan C = Crisis - only use your albuterol nebulizer if you first try Plan B and it fails to help > ok to use the nebulizer up to every 4 hours but if start needing it regularly call for immediate appointment  Goal is to keep your 02 saturations above 90%    Please schedule a follow up office visit in 6 weeks, call sooner if needed  - add  feno on return

## 2018-03-22 NOTE — Progress Notes (Signed)
[name], female    DOB: 1941-01-08,   MRN: [id number]   Brief patient profile:  31 yowf  never smoker  new onset breathing problems 2011 eval by Dr Shirley Friar with dx asthma/bronchitis/copd with daily symptoms of sob no better with inhalers x sev years  Then  became 02 dep arouund 2017 referred to pulmonary clinic 02/22/2018 by Dr   Gwyneth Revels  With pft 04/26/13 c/w minimal airflow obst by curvature of f/v though overall pattern was restrictive assoc with mild kyphosis and chronic L HD elevation   History of Present Illness  02/22/2018  Pulmonary/ 1st office eval/[name]  Chief Complaint  Patient presents with  . Consult    Shortness of breath  Dyspnea:  MMRC2 = can't walk a nl pace on a flat grade s sob but does fine slow and flat only a little better with 02  Cough: constant sense something stuck in throat but min mucoid production - worse with exp to smoke or strong scents  Sleep: 45 degrees with electric bed  - lower levels has more trouble breathng SABA use: increased use since d/c'd singulair  rec Plan A = Automatic = Symbicort 160 Take 2 puffs first thing in am and then another 2 puffs about 12 hours later.  Work on inhaler technique:     Plan B = Backup Only use your albuterol inhaler  Plan C = Crisis - only use your albuterol nebulizer if you first try Plan B and it fails to help > ok to use the nebulizer up to every 4 hours but if start needing it regularly call for immediate appointment Goal is to keep your 02 saturations above 90%  Please schedule a follow up office visit in 4 weeks, sooner if needed with pfts on return     03/22/2018  f/u ov/[name] re:  ? Asthma on symb 160 poor hfa/ singulair  Chief Complaint  Patient presents with  . Follow-up    PFT's done today. Breathing is overall doing well. She is using her albuterol inhaler 5 x per wk on average.   Dyspnea:  MMRC2 = can't walk a nl pace on a flat grade s sob but does fine slow and flat shopping  Cough:  minimal  Sleeping: 45 degrees x 5 years  SABA use: way too much, used neb 3 h prior to pft testing "thought I was supposed to be using twice daily" - it's what I was told (see last ov that occurred well after she was told this) 02: 2lpm 24/7    No obvious day to day or daytime variability or assoc excess/ purulent sputum or mucus plugs or hemoptysis or cp or chest tightness, subjective wheeze or overt sinus or hb symptoms.   Sleeps  without nocturnal  or early am exacerbation  of respiratory  c/o's or need for noct saba. Also denies any obvious fluctuation of symptoms with weather or environmental changes or other aggravating or alleviating factors except as outlined above   No unusual exposure hx or h/o childhood pna/ asthma or knowledge of premature birth.  Current Allergies, Complete Past Medical History, Past Surgical History, Family History, and Social History were reviewed in [employer] record.  ROS  The following are not active complaints unless bolded Hoarseness, sore throat, dysphagia, dental problems, itching, sneezing,  nasal congestion or discharge of excess mucus or purulent secretions, ear ache,   fever, chills, sweats, unintended wt loss or wt gain, classically pleuritic  or exertional cp,  orthopnea pnd or arm/hand swelling  or leg swelling, presyncope, palpitations, abdominal pain, anorexia, nausea, vomiting, diarrhea  or change in bowel habits or change in bladder habits, change in stools or change in urine, dysuria, hematuria,  rash, arthralgias, visual complaints, headache, numbness, weakness or ataxia or problems with walking or coordination,  change in mood or  memory.        Current Meds  Medication Sig  . acetaminophen (TYLENOL) 500 MG tablet Take 500 mg by mouth 2 (two) times daily.   Marland Kitchen albuterol (ACCUNEB) 0.63 MG/3ML nebulizer solution Take 3 mLs (0.63 mg total) by nebulization every 6 (six) hours as needed for wheezing or shortness of breath.  Marland Kitchen  albuterol (PROVENTIL HFA;VENTOLIN HFA) 108 (90 Base) MCG/ACT inhaler INHALE 2 PUFFS INTO THE LUNGS 4 (FOUR) TIMES DAILY AS NEEDED FOR WHEEZING OR SHORTNESS OF BREATH.  Marland Kitchen allopurinol (ZYLOPRIM) 100 MG tablet Take 200 mg by mouth daily.  Marland Kitchen b complex vitamins tablet Take 1 tablet by mouth daily.  . benzonatate (TESSALON) 100 MG capsule Take 1 capsule (100 mg total) by mouth 3 (three) times daily as needed for cough.  . budesonide-formoterol (SYMBICORT) 160-4.5 MCG/ACT inhaler INHALE 2 PUFFS INTO THE LUNGS 2 TIMES DAILY (Patient taking differently: Inhale 2 puffs into the lungs 2 (two) times daily. )  . cholecalciferol (VITAMIN D) 1000 units tablet Take 1,000 Units daily by mouth.  . famotidine (PEPCID) 20 MG tablet Take 1 tablet (20 mg total) by mouth daily.  . Ferrous Fumarate-Folic Acid (HEMOCYTE-F) 324-1 MG TABS Take 1 tablet by mouth daily.  . fluticasone (FLONASE) 50 MCG/ACT nasal spray Place 1 spray into both nostrils daily. (Patient taking differently: Place 1 spray into both nostrils daily as needed for allergies. )  . furosemide (LASIX) 40 MG tablet TAKE 1 TABLET BY MOUTH DAILY AND A 2ND TABLET DAILY AS NEEDED FOR INCREASED EDEMA OR WEIGHT GAIN >3 LBS/24 HOUR  . glucose blood (ONE TOUCH TEST STRIPS) test strip Use as instructed to check blood sugar once weekly. DX 790.29   . loratadine (CLARITIN) 10 MG tablet Take 1 tablet (10 mg total) by mouth at bedtime. (Patient taking differently: Take 10 mg by mouth at bedtime as needed for allergies. )  . metoprolol succinate (TOPROL-XL) 50 MG 24 hr tablet TAKE 1 AND 1/2 TABLETS EVERY DAY (Patient taking differently: Take 75 mg by mouth daily. )  . montelukast (SINGULAIR) 10 MG tablet Take 1 tablet (10 mg total) by mouth at bedtime.  . OXYGEN Inhale 2 L into the lungs continuous.   . potassium chloride SA (K-DUR,KLOR-CON) 20 MEQ tablet Take 1 tablet (20 mEq total) by mouth See admin instructions. And a 2nd tab po daily prn extra Furosemide tab  .  pravastatin (PRAVACHOL) 20 MG tablet Take 1 tablet (20 mg total) by mouth every evening.  . Probiotic Product (PROBIOTIC PO) Take 1 tablet by mouth daily.   . sertraline (ZOLOFT) 100 MG tablet Take 1 tablet (100 mg total) by mouth daily.  . traMADol (ULTRAM) 50 MG tablet Take 1 tablet (50 mg total) by mouth 3 (three) times daily as needed for moderate pain.  Marland Kitchen trolamine salicylate (ASPERCREME) 10 % cream Apply 1 application topically as needed for muscle pain.  . vitamin B-12 (CYANOCOBALAMIN) 1000 MCG tablet Take 1,000 mcg by mouth daily.           Objective:     amb wf nad   Wt Readings from Last 3 Encounters:  03/22/18 190 lb (86.2 kg)  02/22/18 197 lb (89.4 kg)  02/18/18 189 lb 1.9 oz (85.8 kg)     Vital signs reviewed - Note on arrival 02 sats  94% on    2lpm     HEENT: Top dentures/ nl  turbinates bilaterally, and oropharynx. Nl external ear canals without cough reflex   NECK :  without JVD/Nodes/TM/ nl carotid upstrokes bilaterally   LUNGS: no acc muscle use,  Nl contour chest which is clear to A and P bilaterally without cough on insp or exp maneuvers   CV:  RRR  no s3 or murmur or increase in P2, and trace ankle edema sym bilaterally  ABD:  Mod obese/ soft and nontender with nl inspiratory excursion in the supine position. No bruits or organomegaly appreciated, bowel sounds nl  MS:  Nl gait/ ext warm without deformities, calf tenderness, cyanosis or clubbing No obvious joint restrictions   SKIN: warm and dry without lesions    NEURO:  alert, approp, nl sensorium with  no motor or cerebellar deficits apparent.        Assessment

## 2018-03-22 NOTE — Progress Notes (Signed)
PFT done today. 

## 2018-03-23 ENCOUNTER — Encounter: Payer: Self-pay | Admitting: Internal Medicine

## 2018-03-23 NOTE — Assessment & Plan Note (Signed)
HCO3   02/18/2018   = 31  02/22/2018  Room Air at Rest = 88%  >>> while Ambulating = 87% and  on 3 Liters of oxygen while Ambulating = 94% - 03/22/2018   Walked 2lpm   1 laps @  approx 229ft each @ slow pace  stopped due to  Sob/desats resolved on 3lpm  And finished second lap s desats but stopped nonetheless with sob  rec 2lpm but increase to 3lpm with exertion above a few hundred feet or titrate   I had an extended discussion with the patient   reviewing all relevant studies completed to date and  lasting 15 to 20 minutes of a 25 minute visit  which included directly observing ambulatory 02 saturation study documented in a/p section of  today's  office note.  See device teaching which extended face to face time for this visit   Each maintenance medication was reviewed in detail including most importantly the difference between maintenance and prns and under what circumstances the prns are to be triggered using an action plan format that is not reflected in the computer generated alphabetically organized AVS.     Please see AVS for specific instructions unique to this visit that I personally wrote and verbalized to the the pt in detail and then reviewed with pt  by my nurse highlighting any changes in therapy recommended at today's visit .

## 2018-03-23 NOTE — Assessment & Plan Note (Addendum)
Never smoker - Allergy profile 03/11/13  IgE 11 RAST  - Alpha one AT Screen 03/11/13  Level 167  PFT's  04/26/13   FEV1 1.47 (67 % ) ratio 0.77  p no % improvement from saba p nothing prior to study with DLCO  64 % corrects to 77 % for alv volume with mild curvature   - PFT's  03/22/2018  FEV1 1.11 (54 % ) ratio 0.70  p no % improvement from saba p neb saba 3 h  prior to study with DLCO  53 % corrects to 90 % for alv volume and ERV 16%    With mild curvature  - 02/22/2018  After extensive coaching inhaler device,  effectiveness =    75% from a baseline of < 25% so rec continue symbicort 160 2bid   DDX of  difficult airways management almost all start with A and  include Adherence, Ace Inhibitors, Acid Reflux, Active Sinus Disease, Alpha 1 Antitripsin deficiency, Anxiety masquerading as Airways dz,  ABPA,  Allergy(esp in young), Aspiration (esp in elderly), Adverse effects of meds,  Active smoking or vaping, A bunch of PE's (a small clot burden can't cause this syndrome unless there is already severe underlying pulm or vascular dz with poor reserve) plus two Bs  = Bronchiectasis and Beta blocker use..and one C= CHF    In this case Adherence is the biggest issue and starts with  inability to use HFA effectively and also  understand that SABA treats the symptoms but doesn't get to the underlying problem (inflammation).  I used  the analogy of putting steroid cream on a rash to help explain the meaning of topical therapy and the need to get the drug to the target tissue.  .  - see hfa teaching   ? Allergy component > continue singulair / check feno next ov but no need to repeat allergy profile since neg in 2015    ? Anxiety/depression/ deconditioning  > usually at the bottom of this list of usual suspects but should be   higher on this pt's based on H and P and note already on psychotropics and may interfere with adherence and also interpretation of response or lack thereof to symptom management which can be  quite subjective.  ? Alpha one at def  > ruled out 03/12/13

## 2018-03-30 ENCOUNTER — Other Ambulatory Visit: Payer: Self-pay | Admitting: Family Medicine

## 2018-03-30 NOTE — Telephone Encounter (Signed)
Requesting:tramadol Contract:yes UDS:low risk next screen 12/23/17 Last OV:01/25/18 Next OV:n/a Last Refill:01/25/18  #[id number] Database:   Please advise

## 2018-04-05 ENCOUNTER — Encounter: Payer: Self-pay | Admitting: Family Medicine

## 2018-04-05 ENCOUNTER — Other Ambulatory Visit: Payer: Self-pay

## 2018-04-05 ENCOUNTER — Ambulatory Visit (INDEPENDENT_AMBULATORY_CARE_PROVIDER_SITE_OTHER): Payer: Medicare HMO | Admitting: Family Medicine

## 2018-04-05 VITALS — BP 108/70 | HR 54 | Temp 97.8°F | Ht 65.0 in | Wt 188.0 lb

## 2018-04-05 DIAGNOSIS — J0100 Acute maxillary sinusitis, unspecified: Secondary | ICD-10-CM | POA: Diagnosis not present

## 2018-04-05 MED ORDER — PREDNISONE 20 MG PO TABS: 40.0000 mg | ORAL_TABLET | Freq: Every day | ORAL | 0 refills | Status: AC

## 2018-04-05 MED ORDER — DOXYCYCLINE HYCLATE 100 MG PO TABS: 100.0000 mg | ORAL_TABLET | Freq: Two times a day (BID) | ORAL | 0 refills | Status: DC

## 2018-04-05 MED ORDER — FLUCONAZOLE 150 MG PO TABS: 150.0000 mg | ORAL_TABLET | Freq: Once | ORAL | 0 refills | Status: AC

## 2018-04-05 NOTE — Progress Notes (Signed)
Chief Complaint  Patient presents with  . Cough  . Sinusitis  . Flank Pain    left side    [name] here for URI complaints.  Duration: 4 days  Associated symptoms: rhinorrhea, itchy watery eyes, ear pain, sore throat, wheezing, shortness of breath and cough Denies: sinus congestion, sinus pain, ear drainage, myalgia and fevers Treatment to date: OTC cough meds, her inhalers Sick contacts: Yes  She is on chronic O2 therapy.   ROS:  Const: Denies fevers HEENT: As noted in HPI Lungs: +SOB  Past Medical History:  Diagnosis Date  . Abnormal glucose tolerance test   . Abnormal TSH 09/22/2016  . ACE-inhibitor cough   . Anemia 03/12/2014  . Arthritis   . Asthma    PFT 02/06/09 FEV1 1.41 (65%), FVC 1.92 (64), FEV1% 74, TLC 3.47 (71%), DLCO 48%, +BD  . Atypical chest pain    s/p cath, Normal coronaries, Non ST elevation myocardial infarction, Rt groin pseudoaneurysm  . Bacterial vaginosis 03/12/2014  . Cellulitis 06/22/2016  . Chronic kidney disease (CKD), stage III (moderate) ([place]) 08/04/2016  . COPD (chronic obstructive pulmonary disease) ([place])   . Depression   . Diastolic heart failure ([place]) 04/07/2016  . DVT (deep venous thrombosis) ([place]) 1987  . GERD (gastroesophageal reflux disease)   . Gout   . Hypercalcemia 10/15/2014  . Hyperlipidemia   . Hypertension   . Hypoxia 10/15/2014  . Macular degeneration 04/10/2015  . Osteopenia 12/29/2006   Qualifier: Diagnosis of  By: Wynona Luna   . Pneumonia   . 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 Renal insufficiency 09/22/2016  . Unspecified constipation 06/05/2013  . Vitamin D deficiency 01/01/2015    BP 108/70 (BP Location: Left Arm, Patient Position: Sitting, Cuff Size: Normal)   Pulse (!) 54   Temp 97.8 F (36.6 C) (Oral)   Ht 5\' 5"  (1.651 m)   Wt 188 lb (85.3 kg)   SpO2 98%   BMI 31.28 kg/m  General: Awake, alert, appears stated age HEENT: AT, [place], ears patent b/l and TM's neg, nares patent w/o discharge, +TTP  over max sinuses, pharynx pink and without exudates, +clear drainage over pharynx, MMM Neck: No masses or asymmetry Heart: RRR Lungs: CTAB, no accessory muscle use Psych: Age appropriate judgment and insight, normal mood and affect  Acute maxillary sinusitis, recurrence not specified - Plan: predniSONE (DELTASONE) 20 MG tablet, doxycycline (VIBRA-TABS) 100 MG tablet, fluconazole (DIFLUCAN) 150 MG tablet  +pocket rx. Pred in meantime, Diflucan if needed. Tylenol. Continue to push fluids, practice good hand hygiene, cover mouth when coughing. F/u prn. If starting to experience fevers, shaking, or shortness of breath, seek immediate care. Pt voiced understanding and agreement to the plan.  Clarion, DO 04/05/18 11:55 AM

## 2018-04-05 NOTE — Patient Instructions (Signed)
Most sinus infections are viral in etiology and antibiotics will not be helpful. That being said, if you start having worsening symptoms over 3 days, you are worsening by day 10 or not improving by day 14, go ahead and take it. You are on Day 4 as of now.   OK to take Tylenol 1000 mg (2 extra strength tabs) or 975 mg (3 regular strength tabs) every 6 hours as needed.  Continue to push fluids, practice good hand hygiene, and cover your mouth if you cough.  If you start having fevers, shaking or shortness of breath, seek immediate care.  Let us know if you need anything.

## 2018-04-19 ENCOUNTER — Telehealth: Payer: Self-pay | Admitting: Family

## 2018-04-19 ENCOUNTER — Other Ambulatory Visit: Payer: Self-pay

## 2018-04-19 ENCOUNTER — Inpatient Hospital Stay: Payer: Medicare HMO

## 2018-04-19 ENCOUNTER — Inpatient Hospital Stay (HOSPITAL_BASED_OUTPATIENT_CLINIC_OR_DEPARTMENT_OTHER): Payer: Medicare HMO | Admitting: Family

## 2018-04-19 ENCOUNTER — Inpatient Hospital Stay: Payer: Medicare HMO | Attending: Family

## 2018-04-19 VITALS — BP 120/54 | HR 85 | Temp 98.2°F | Resp 21 | Ht 65.0 in | Wt 193.5 lb

## 2018-04-19 DIAGNOSIS — N183 Chronic kidney disease, stage 3 unspecified: Secondary | ICD-10-CM

## 2018-04-19 DIAGNOSIS — D631 Anemia in chronic kidney disease: Secondary | ICD-10-CM

## 2018-04-19 DIAGNOSIS — Z79899 Other long term (current) drug therapy: Secondary | ICD-10-CM

## 2018-04-19 DIAGNOSIS — D508 Other iron deficiency anemias: Secondary | ICD-10-CM

## 2018-04-19 DIAGNOSIS — D696 Thrombocytopenia, unspecified: Secondary | ICD-10-CM

## 2018-04-19 LAB — CBC WITH DIFFERENTIAL (CANCER CENTER ONLY)
Abs Immature Granulocytes: 0.03 10*3/uL (ref 0.00–0.07)
Basophils Absolute: 0 10*3/uL (ref 0.0–0.1)
Basophils Relative: 1 %
Eosinophils Absolute: 0.3 10*3/uL (ref 0.0–0.5)
Eosinophils Relative: 5 %
HCT: 32.9 % — ABNORMAL LOW (ref 36.0–46.0)
HEMOGLOBIN: 10.3 g/dL — AB (ref 12.0–15.0)
Immature Granulocytes: 1 %
Lymphocytes Relative: 17 %
Lymphs Abs: 1.1 10*3/uL (ref 0.7–4.0)
MCH: 30.7 pg (ref 26.0–34.0)
MCHC: 31.3 g/dL (ref 30.0–36.0)
MCV: 97.9 fL (ref 80.0–100.0)
Monocytes Absolute: 0.9 10*3/uL (ref 0.1–1.0)
Monocytes Relative: 15 %
NEUTROS PCT: 61 %
Neutro Abs: 3.9 10*3/uL (ref 1.7–7.7)
Platelet Count: 96 10*3/uL — ABNORMAL LOW (ref 150–400)
RBC: 3.36 MIL/uL — AB (ref 3.87–5.11)
RDW: 16 % — ABNORMAL HIGH (ref 11.5–15.5)
WBC Count: 6.3 10*3/uL (ref 4.0–10.5)
nRBC: 0 % (ref 0.0–0.2)

## 2018-04-19 LAB — CMP (CANCER CENTER ONLY)
ALT: 11 U/L (ref 0–44)
AST: 13 U/L — AB (ref 15–41)
Albumin: 4 g/dL (ref 3.5–5.0)
Alkaline Phosphatase: 47 U/L (ref 38–126)
Anion gap: 9 (ref 5–15)
BUN: 51 mg/dL — AB (ref 8–23)
CHLORIDE: 102 mmol/L (ref 98–111)
CO2: 30 mmol/L (ref 22–32)
Calcium: 8.9 mg/dL (ref 8.9–10.3)
Creatinine: 2.05 mg/dL — ABNORMAL HIGH (ref 0.44–1.00)
GFR, Est AFR Am: 26 mL/min — ABNORMAL LOW (ref >60)
GFR, Estimated: 23 mL/min — ABNORMAL LOW (ref >60)
Glucose, Bld: 91 mg/dL (ref 70–99)
Potassium: 4.7 mmol/L (ref 3.5–5.1)
Sodium: 141 mmol/L (ref 135–145)
Total Bilirubin: 0.5 mg/dL (ref 0.3–1.2)
Total Protein: 6.7 g/dL (ref 6.5–8.1)

## 2018-04-19 LAB — SAVE SMEAR(SSMR), FOR PROVIDER SLIDE REVIEW

## 2018-04-19 MED ORDER — DARBEPOETIN ALFA 300 MCG/0.6ML IJ SOSY
PREFILLED_SYRINGE | INTRAMUSCULAR | Status: AC
  Filled 2018-04-19: qty 0.6

## 2018-04-19 MED ORDER — DARBEPOETIN ALFA 300 MCG/0.6ML IJ SOSY
300.0000 ug | PREFILLED_SYRINGE | Freq: Once | INTRAMUSCULAR | Status: AC
  Administered 2018-04-19: 300 ug via SUBCUTANEOUS

## 2018-04-19 NOTE — Patient Instructions (Signed)

## 2018-04-19 NOTE — Progress Notes (Signed)
Hematology and Oncology Follow Up Visit  [name] [id number] Mar 31, 1940 78 y.o. 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 She is doing well and has no complaints at this time.   She has some intermittent mild fatigue at times.  Her SOB is stable. She is on 2L supplemental O2 as needed.  She has occasional episodes of palpitations that last a few minutes and resolve on their own.   No fever, chills, n/v, cough, rash, dizziness, chest pain, abdominal pain or changes in bowel or bladder habits.  No falls or syncopal episodes.  No lymphadenopathy noted on exam.  No episodes of bleeding, no bruising or petechiae.  The swelling in her lower extremities is unchanged. She takes lasix as directed daily. She will also try wearing compression stockings during the day.  She has maintained a a good appetite and is staying well hydrated. Her weight is stable.   ECOG Performance Status: 1 - Symptomatic but completely ambulatory  Medications:  Allergies as of 04/19/2018      Reactions   Montelukast Sodium Other (See Comments)   REACTION: HEART PALPITATIONS, CHEST PAIN.   Sulfa Antibiotics Other (See Comments)   CHEST PAIN   Sulfonamide Derivatives Other (See Comments)   CHEST PAIN   Oseltamivir Phosphate Diarrhea, Nausea And Vomiting, Other (See Comments)   TAMIFLU REACTION:  dizziness   Ace Inhibitors Other (See Comments)   PT. STATED UNKNOWN REACTION   Indomethacin Other (See Comments)   PT. STATED UNKNOWN REACTION      Medication List       Accurate as of April 19, 2018  1:29 PM. Always use your most recent med list.        acetaminophen 500 MG tablet Commonly known as:  TYLENOL Take 500 mg by mouth 2 (two) times daily.   albuterol 0.63 MG/3ML nebulizer solution Commonly known as:  ACCUNEB Take 3 mLs  (0.63 mg total) by nebulization every 6 (six) hours as needed for wheezing or shortness of breath.   albuterol 108 (90 Base) MCG/ACT inhaler Commonly known as:  PROVENTIL HFA;VENTOLIN HFA INHALE 2 PUFFS INTO THE LUNGS 4 (FOUR) TIMES DAILY AS NEEDED FOR WHEEZING OR SHORTNESS OF BREATH.   allopurinol 100 MG tablet Commonly known as:  ZYLOPRIM Take 200 mg by mouth daily.   b complex vitamins tablet Take 1 tablet by mouth daily.   benzonatate 100 MG capsule Commonly known as:  TESSALON Take 1 capsule (100 mg total) by mouth 3 (three) times daily as needed for cough.   budesonide-formoterol 160-4.5 MCG/ACT inhaler Commonly known as:  Symbicort INHALE 2 PUFFS INTO THE LUNGS 2 TIMES DAILY   cholecalciferol 1000 units tablet Commonly known as:  VITAMIN D Take 1,000 Units daily by mouth.   doxycycline 100 MG tablet Commonly known as:  VIBRA-TABS Take 1 tablet (100 mg total) by mouth 2 (two) times daily.   famotidine 20 MG tablet Commonly known as:  Pepcid Take 1 tablet (20 mg total) by mouth daily.   Ferrous Fumarate-Folic Acid 245-8 MG Tabs Commonly known as:  Hemocyte-F Take 1 tablet by mouth daily.   fluticasone 50 MCG/ACT nasal spray Commonly known as:  FLONASE Place 1 spray into both nostrils daily.   furosemide 40 MG tablet Commonly known as:  LASIX TAKE 1 TABLET  BY MOUTH DAILY AND A 2ND TABLET DAILY AS NEEDED FOR INCREASED EDEMA OR WEIGHT GAIN >3 LBS/24 HOUR   loratadine 10 MG tablet Commonly known as:  CLARITIN Take 1 tablet (10 mg total) by mouth at bedtime.   metoprolol succinate 50 MG 24 hr tablet Commonly known as:  TOPROL-XL TAKE 1 AND 1/2 TABLETS EVERY DAY   montelukast 10 MG tablet Commonly known as:  SINGULAIR Take 1 tablet (10 mg total) by mouth at bedtime.   ONE TOUCH TEST STRIPS test strip Generic drug:  glucose blood Use as instructed to check blood sugar once weekly. DX 790.29   OXYGEN Inhale 2 L into the lungs continuous.   potassium  chloride SA 20 MEQ tablet Commonly known as:  K-DUR,KLOR-CON Take 1 tablet (20 mEq total) by mouth See admin instructions. And a 2nd tab po daily prn extra Furosemide tab   pravastatin 20 MG tablet Commonly known as:  PRAVACHOL Take 1 tablet (20 mg total) by mouth every evening.   PROBIOTIC PO Take 1 tablet by mouth daily.   sertraline 100 MG tablet Commonly known as:  ZOLOFT Take 1 tablet (100 mg total) by mouth daily.   traMADol 50 MG tablet Commonly known as:  ULTRAM TAKE 1 TABLET BY MOUTH THREE TIMES DAILY AS NEEDED FOR MODERATE PAIN   trolamine salicylate 10 % cream Commonly known as:  ASPERCREME Apply 1 application topically as needed for muscle pain.   vitamin B-12 1000 MCG tablet Commonly known as:  CYANOCOBALAMIN Take 1,000 mcg by mouth daily.       Allergies:  Allergies  Allergen Reactions  . Montelukast Sodium Other (See Comments)    REACTION: HEART PALPITATIONS, CHEST PAIN.  Marland Kitchen Sulfa Antibiotics Other (See Comments)    CHEST PAIN  . Sulfonamide Derivatives Other (See Comments)    CHEST PAIN  . Oseltamivir Phosphate Diarrhea, Nausea And Vomiting and Other (See Comments)    TAMIFLU REACTION:  dizziness  . Ace Inhibitors Other (See Comments)    PT. STATED UNKNOWN REACTION  . Indomethacin Other (See Comments)    PT. STATED UNKNOWN REACTION    Past Medical History, Surgical history, Social history, and Family History were reviewed and updated.  Review of Systems: All other 10 point review of systems is negative.   Physical Exam:  vitals were not taken for this visit.   Wt Readings from Last 3 Encounters:  04/05/18 188 lb (85.3 kg)  03/22/18 190 lb (86.2 kg)  02/22/18 197 lb (89.4 kg)    Ocular: Sclerae unicteric, pupils equal, round and reactive to light Ear-nose-throat: Oropharynx clear, dentition fair Lymphatic: No cervical, supraclavicular or axillary adenopathy Lungs no rales or rhonchi, good excursion bilaterally Heart regular rate and  rhythm, no murmur appreciated Abd soft, nontender, positive bowel sounds, no liver or spleen tip palpated on exam, no fluid wave  MSK no focal spinal tenderness, no joint edema Neuro: non-focal, well-oriented, appropriate affect Breasts: Deferred  Lab Results  Component Value Date   WBC 7.2 02/18/2018   HGB 10.4 (L) 02/18/2018   HCT 34.6 (L) 02/18/2018   MCV 101.5 (H) 02/18/2018   PLT 144 (L) 02/18/2018   Lab Results  Component Value Date   FERRITIN 414 (H) 02/18/2018   IRON 84 02/18/2018   TIBC 248 02/18/2018   UIBC 164 02/18/2018   IRONPCTSAT 34 02/18/2018   Lab Results  Component Value Date   RETICCTPCT 1.2 10/16/2017   RBC 3.41 (L) 02/18/2018   No results found for:  KPAFRELGTCHN, LAMBDASER, KAPLAMBRATIO No results found for: IGGSERUM, IGA, IGMSERUM No results found for: Odetta Pink, SPEI   Chemistry      Component Value Date/Time   NA 143 02/18/2018 1310   NA 143 07/21/2017 1501   K 4.5 02/18/2018 1310   CL 103 02/18/2018 1310   CO2 31 02/18/2018 1310   BUN 38 (H) 02/18/2018 1310   BUN 41 (H) 07/21/2017 1501   CREATININE 1.75 (H) 02/18/2018 1310   CREATININE 1.36 (H) 08/23/2013 1134      Component Value Date/Time   CALCIUM 9.7 02/18/2018 1310   ALKPHOS 52 02/18/2018 1310   AST 13 (L) 02/18/2018 1310   ALT 9 02/18/2018 1310   BILITOT 0.5 02/18/2018 1310       Impression and Plan: Ms. Vancamp is a very pleasant 78 yo caucasian female with multifactorial anemia.  Hgb today is 10.3 so she did receive Aranesp.  We will plan to see her back in another 2 months.  She will contact our office with any questions or concerns. We can certainly see her sooner if need be.  Laverna Peace, NP 3/30/20201:29 PM

## 2018-04-19 NOTE — Telephone Encounter (Signed)
Appointments scheduled avs/calendar printed per 3/30 los

## 2018-04-20 LAB — FERRITIN: Ferritin: 453 ng/mL — ABNORMAL HIGH (ref 11–307)

## 2018-04-20 LAB — IRON AND TIBC
Iron: 72 ug/dL (ref 41–142)
Saturation Ratios: 29 % (ref 21–57)
TIBC: 245 ug/dL (ref 236–444)
UIBC: 173 ug/dL (ref 120–384)

## 2018-05-05 ENCOUNTER — Ambulatory Visit: Payer: Self-pay | Admitting: Internal Medicine

## 2018-05-06 ENCOUNTER — Other Ambulatory Visit: Payer: Self-pay

## 2018-05-06 ENCOUNTER — Ambulatory Visit (INDEPENDENT_AMBULATORY_CARE_PROVIDER_SITE_OTHER): Payer: Medicare HMO | Admitting: Internal Medicine

## 2018-05-06 DIAGNOSIS — J9612 Chronic respiratory failure with hypercapnia: Secondary | ICD-10-CM | POA: Diagnosis not present

## 2018-05-06 DIAGNOSIS — J4530 Mild persistent asthma, uncomplicated: Secondary | ICD-10-CM | POA: Diagnosis not present

## 2018-05-06 DIAGNOSIS — J9611 Chronic respiratory failure with hypoxia: Secondary | ICD-10-CM

## 2018-05-06 NOTE — Patient Instructions (Signed)
Have your [place] contact this office for the information you need for your 02 24/7    Please schedule a follow up visit in 3 months but call sooner if needed  with all medications /inhalers/ solutions in hand so we can verify exactly what you are taking. This includes all medications from all doctors and over the counters

## 2018-05-06 NOTE — Progress Notes (Signed)
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 Consult    Shortness of breath  Dyspnea:  MMRC2 = can't walk a nl pace on a flat grade s sob but does fine slow and flat only a little better with 02  Cough: constant sense something stuck in throat but min mucoid production - worse with exp to smoke or strong scents  Sleep: 45 degrees with electric bed  - lower levels has more trouble breathng SABA use: increased use since d/c'd singulair  rec Plan A = Automatic = Symbicort 160 Take 2 puffs first thing in am and then another 2 puffs about 12 hours later.  Work on inhaler technique:     Plan B = Backup Only use your albuterol inhaler  Plan C = Crisis - only use your albuterol nebulizer if you first try Plan B and it fails to help > ok to use the nebulizer up to every 4 hours but if start needing it regularly call for immediate appointment Goal is to keep your 02 saturations above 90%  Please schedule a follow up office visit in 4 weeks, sooner if needed with pfts on return     03/22/2018  f/u ov/[name] re:  ? Asthma on symb 160 poor hfa/ singulair  Chief Complaint  Patient presents with  . Follow-up    PFT's done today. Breathing is overall doing well. She is using her albuterol inhaler 5 x per wk on average.   Dyspnea:  MMRC2 = can't walk a nl pace on a flat grade s sob but does fine slow and flat shopping  Cough:  minimal  Sleeping: 45 degrees x 5 years  SABA use: way too much, used neb 3 h prior to pft testing "thought I was supposed to be using twice daily" - it's what I was told (see last ov that occurred well after she was told this) 02: 2lpm 24/7  rec Plan A = Automatic = Symbicort 160 Take 2 puffs first thing in am and then another 2 puffs about 12 hours later.  Work on inhaler technique: Plan B = Backup Only use your albuterol inhaler as a rescue medication Plan C = Crisis - only use your albuterol nebulizer Goal is to keep your 02 saturations above 90%  Please schedule a follow up office visit in 6 weeks, call sooner if needed  - add  feno on return     . Virtual Visit via Telephone Note 05/06/2018   I connected with [name] on 05/06/18 at  11:45  by telephone and verified that I am speaking with the correct person using two identifiers.   I discussed the limitations, risks, security and privacy concerns of performing an evaluation and management service by telephone and the availability of in person appointments. I also discussed with the patient that there may be  a patient responsible charge related to this service. The patient expressed understanding and agreed to proceed.   History of Present Illness: Dyspnea:  Walking in house on 2lpm / no steps  Cough: resolved Sleeping: still feels smothering at < 45 degrees  X years  SABA use: none  02: 2lpm daytime  turns it one lpm hs    No obvious day to day or daytime variability or assoc excess/ purulent sputum or mucus plugs or hemoptysis or cp or chest tightness, subjective wheeze or overt sinus or hb symptoms.    Also denies any obvious fluctuation of symptoms with weather or environmental changes or other aggravating or alleviating factors except as outlined above.   Meds reviewed/ med reconciliation completed        Observations/Objective: Phonation clear/ outlook very positive/ speaking in full sentences     Assessment and Plan: See problem list for active a/p's   Follow Up Instructions: See avs for instructions unique to this ov which includes revised/ updated med list     I discussed the assessment and treatment plan with the patient. The patient was provided an opportunity to ask questions and all were answered. The patient agreed with the plan and demonstrated an understanding of the instructions.   The patient was advised to call back or seek an in-person evaluation if the symptoms worsen or if the condition fails to improve as anticipated.  I provided 15  minutes of non-face-to-face time during this encounter.   Christinia Gully, MD

## 2018-05-07 ENCOUNTER — Encounter: Payer: Self-pay | Admitting: Internal Medicine

## 2018-05-07 NOTE — Assessment & Plan Note (Signed)
Never smoker - Allergy profile 03/11/13  IgE 11 RAST  - Alpha one AT Screen 03/11/13  Level 167  PFT's  04/26/13   FEV1 1.47 (67 % ) ratio 0.77  p no % improvement from saba p nothing prior to study with DLCO  64 % corrects to 77 % for alv volume with mild curvature   - PFT's  03/22/2018  FEV1 1.11 (54 % ) ratio 0.70  p no % improvement from saba p neb saba 3 h  prior to study with DLCO  53 % corrects to 90 % for alv volume and ERV 16%    With mild curvature  - 02/22/2018  After extensive coaching inhaler device,  effectiveness =    75% from a baseline of < 25% so rec continue symbicort 160 2bid    Adequate control on present rx, reviewed in detail with pt > no change in rx needed

## 2018-05-07 NOTE — Assessment & Plan Note (Signed)
HCO3   02/18/2018   = 31  02/22/2018  Room Air at Rest = 88%  >>> while Ambulating = 87% and  on 3 Liters of oxygen while Ambulating = 94% - 03/22/2018   Walked 2lpm   1 laps @  approx 242ft each @ slow pace  stopped due to  Sob/desats resolved on 3lpm  And finished second lap s desats but stopped nonetheless with sob   As of 05/06/2018  rec 1lpm hs and 2lpm daytime but ok to adjust to keep sats > 90% walking    Each maintenance medication was reviewed in detail including most importantly the difference between maintenance and as needed and under what circumstances the prns are to be used.  Please see AVS for specific  Instructions which are unique to this visit and I personally typed out  which were reviewed in detail in writing with the patient and a copy provided.     F/u in 3 m, sooner if needed

## 2018-05-23 ENCOUNTER — Other Ambulatory Visit: Payer: Self-pay | Admitting: Family Medicine

## 2018-05-24 NOTE — Telephone Encounter (Signed)
Requesting:tramadol  Contract:yes UDS:n/a Last OV:04/05/18 Next OV:n/a Last Refill:03/30/18  #90-0rf Database:   Please advise

## 2018-05-27 ENCOUNTER — Other Ambulatory Visit: Payer: Self-pay | Admitting: Family Medicine

## 2018-06-17 ENCOUNTER — Other Ambulatory Visit: Payer: Self-pay

## 2018-06-17 ENCOUNTER — Inpatient Hospital Stay (HOSPITAL_BASED_OUTPATIENT_CLINIC_OR_DEPARTMENT_OTHER): Payer: Medicare HMO | Admitting: Hematology & Oncology

## 2018-06-17 ENCOUNTER — Encounter: Payer: Self-pay | Admitting: Hematology & Oncology

## 2018-06-17 ENCOUNTER — Ambulatory Visit: Payer: Medicare HMO

## 2018-06-17 ENCOUNTER — Inpatient Hospital Stay: Payer: Medicare HMO | Attending: Family

## 2018-06-17 VITALS — BP 120/57 | HR 83 | Temp 97.8°F | Resp 19 | Wt 187.0 lb

## 2018-06-17 DIAGNOSIS — D631 Anemia in chronic kidney disease: Secondary | ICD-10-CM

## 2018-06-17 DIAGNOSIS — Z79899 Other long term (current) drug therapy: Secondary | ICD-10-CM | POA: Insufficient documentation

## 2018-06-17 DIAGNOSIS — N183 Chronic kidney disease, stage 3 unspecified: Secondary | ICD-10-CM

## 2018-06-17 DIAGNOSIS — D696 Thrombocytopenia, unspecified: Secondary | ICD-10-CM

## 2018-06-17 DIAGNOSIS — D508 Other iron deficiency anemias: Secondary | ICD-10-CM

## 2018-06-17 LAB — CMP (CANCER CENTER ONLY)
ALT: 10 U/L (ref 0–44)
AST: 14 U/L — ABNORMAL LOW (ref 15–41)
Albumin: 4.3 g/dL (ref 3.5–5.0)
Alkaline Phosphatase: 46 U/L (ref 38–126)
Anion gap: 8 (ref 5–15)
BUN: 62 mg/dL — ABNORMAL HIGH (ref 8–23)
CO2: 33 mmol/L — ABNORMAL HIGH (ref 22–32)
Calcium: 9.6 mg/dL (ref 8.9–10.3)
Chloride: 99 mmol/L (ref 98–111)
Creatinine: 2.08 mg/dL — ABNORMAL HIGH (ref 0.44–1.00)
GFR, Est AFR Am: 26 mL/min — ABNORMAL LOW (ref >60)
GFR, Estimated: 22 mL/min — ABNORMAL LOW (ref >60)
Glucose, Bld: 139 mg/dL — ABNORMAL HIGH (ref 70–99)
Potassium: 4.5 mmol/L (ref 3.5–5.1)
Sodium: 140 mmol/L (ref 135–145)
Total Bilirubin: 0.6 mg/dL (ref 0.3–1.2)
Total Protein: 6.9 g/dL (ref 6.5–8.1)

## 2018-06-17 LAB — CBC WITH DIFFERENTIAL (CANCER CENTER ONLY)
Abs Immature Granulocytes: 0.03 10*3/uL (ref 0.00–0.07)
Basophils Absolute: 0.1 10*3/uL (ref 0.0–0.1)
Basophils Relative: 1 %
Eosinophils Absolute: 0.4 10*3/uL (ref 0.0–0.5)
Eosinophils Relative: 6 %
HCT: 36 % (ref 36.0–46.0)
Hemoglobin: 11.4 g/dL — ABNORMAL LOW (ref 12.0–15.0)
Immature Granulocytes: 1 %
Lymphocytes Relative: 17 %
Lymphs Abs: 1.2 10*3/uL (ref 0.7–4.0)
MCH: 30.9 pg (ref 26.0–34.0)
MCHC: 31.7 g/dL (ref 30.0–36.0)
MCV: 97.6 fL (ref 80.0–100.0)
Monocytes Absolute: 0.9 10*3/uL (ref 0.1–1.0)
Monocytes Relative: 13 %
Neutro Abs: 4.2 10*3/uL (ref 1.7–7.7)
Neutrophils Relative %: 62 %
Platelet Count: 137 10*3/uL — ABNORMAL LOW (ref 150–400)
RBC: 3.69 MIL/uL — ABNORMAL LOW (ref 3.87–5.11)
RDW: 14.6 % (ref 11.5–15.5)
WBC Count: 6.7 10*3/uL (ref 4.0–10.5)
nRBC: 0 % (ref 0.0–0.2)

## 2018-06-17 LAB — SAVE SMEAR(SSMR), FOR PROVIDER SLIDE REVIEW

## 2018-06-17 NOTE — Progress Notes (Signed)
Hematology and Oncology Follow Up Visit  [name] [id number] Aug 15, 1940 78 y.o. 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  So far, she is managing with the coronavirus.  She has to wear a mask.  Thankfully, her oxygen is still doing okay.  She does wear supplemental oxygen.  There is really been no change in her medications.  She is eating well.  There is been no bleeding.  There is no change in bowel or bladder habits.  There is been no leg swelling.  Overall, her performance status is ECOG 2.  Medications:  Allergies as of 06/17/2018      Reactions   Montelukast Sodium Other (See Comments)   REACTION: HEART PALPITATIONS, CHEST PAIN.   Sulfa Antibiotics Other (See Comments)   CHEST PAIN   Sulfonamide Derivatives Other (See Comments)   CHEST PAIN   Oseltamivir Phosphate Diarrhea, Nausea And Vomiting, Other (See Comments)   TAMIFLU REACTION:  dizziness   Ace Inhibitors Other (See Comments)   PT. STATED UNKNOWN REACTION   Indomethacin Other (See Comments)   PT. STATED UNKNOWN REACTION      Medication List       Accurate as of Jun 17, 2018  3:17 PM. If you have any questions, ask your nurse or doctor.        acetaminophen 500 MG tablet Commonly known as:  TYLENOL Take 500 mg by mouth 2 (two) times daily.   albuterol 0.63 MG/3ML nebulizer solution Commonly known as:  ACCUNEB Take 3 mLs (0.63 mg total) by nebulization every 6 (six) hours as needed for wheezing or shortness of breath.   albuterol 108 (90 Base) MCG/ACT inhaler Commonly known as:  VENTOLIN HFA INHALE 2 PUFFS INTO THE LUNGS 4 (FOUR) TIMES DAILY AS NEEDED FOR WHEEZING OR SHORTNESS OF BREATH.   allopurinol 100 MG tablet Commonly known as:  ZYLOPRIM Take 200 mg by mouth daily.   b complex vitamins tablet Take 1 tablet by  mouth daily.   benzonatate 100 MG capsule Commonly known as:  TESSALON Take 1 capsule (100 mg total) by mouth 3 (three) times daily as needed for cough.   budesonide-formoterol 160-4.5 MCG/ACT inhaler Commonly known as:  Symbicort INHALE 2 PUFFS INTO THE LUNGS 2 TIMES DAILY What changed:    how much to take  how to take this  when to take this  additional instructions   cholecalciferol 1000 units tablet Commonly known as:  VITAMIN D Take 1,000 Units daily by mouth.   doxycycline 100 MG tablet Commonly known as:  VIBRA-TABS Take 1 tablet (100 mg total) by mouth 2 (two) times daily.   famotidine 20 MG tablet Commonly known as:  Pepcid Take 1 tablet (20 mg total) by mouth daily.   Ferrous Fumarate-Folic Acid 150-5 MG Tabs Commonly known as:  Hemocyte-F Take 1 tablet by mouth daily.   fluconazole 150 MG tablet Commonly known as:  DIFLUCAN TAKE ONE TABLET BY MOUTH AS A ONE TIME DOSE   fluticasone 50 MCG/ACT nasal spray Commonly known as:  FLONASE Place 1 spray into both nostrils daily. What changed:    when to take this  reasons to take this   furosemide 40 MG tablet Commonly known as:  LASIX TAKE 1 TABLET BY MOUTH DAILY AND A 2ND TABLET DAILY AS NEEDED  FOR INCREASED EDEMA OR WEIGHT GAIN >3 LBS/24 HOUR   loratadine 10 MG tablet Commonly known as:  CLARITIN Take 1 tablet (10 mg total) by mouth at bedtime. What changed:    when to take this  reasons to take this   metoprolol succinate 50 MG 24 hr tablet Commonly known as:  TOPROL-XL TAKE 1 AND 1/2 TABLETS EVERY DAY   montelukast 10 MG tablet Commonly known as:  SINGULAIR Take 1 tablet (10 mg total) by mouth at bedtime.   ONE TOUCH TEST STRIPS test strip Generic drug:  glucose blood Use as instructed to check blood sugar once weekly. DX 790.29   OXYGEN Inhale 2 L into the lungs continuous.   potassium chloride SA 20 MEQ tablet Commonly known as:  K-DUR Take 1 tablet (20 mEq total) by mouth See  admin instructions. And a 2nd tab po daily prn extra Furosemide tab   pravastatin 20 MG tablet Commonly known as:  PRAVACHOL Take 1 tablet (20 mg total) by mouth every evening.   PROBIOTIC PO Take 1 tablet by mouth daily.   sertraline 100 MG tablet Commonly known as:  ZOLOFT Take 1 tablet (100 mg total) by mouth daily.   traMADol 50 MG tablet Commonly known as:  ULTRAM TAKE 1 TABLET BY MOUTH THREE TIMES DAILY AS NEEDED FOR MODERATE PAIN   trolamine salicylate 10 % cream Commonly known as:  ASPERCREME Apply 1 application topically as needed for muscle pain.   vitamin B-12 1000 MCG tablet Commonly known as:  CYANOCOBALAMIN Take 1,000 mcg by mouth daily.       Allergies:  Allergies  Allergen Reactions  . Montelukast Sodium Other (See Comments)    REACTION: HEART PALPITATIONS, CHEST PAIN.  Marland Kitchen Sulfa Antibiotics Other (See Comments)    CHEST PAIN  . Sulfonamide Derivatives Other (See Comments)    CHEST PAIN  . Oseltamivir Phosphate Diarrhea, Nausea And Vomiting and Other (See Comments)    TAMIFLU REACTION:  dizziness  . Ace Inhibitors Other (See Comments)    PT. STATED UNKNOWN REACTION  . Indomethacin Other (See Comments)    PT. STATED UNKNOWN REACTION    Past Medical History, Surgical history, Social history, and Family History were reviewed and updated.  Review of Systems: All other 10 point review of systems is negative.   Physical Exam:  weight is 187 lb (84.8 kg). Her oral temperature is 97.8 F (36.6 C). Her blood pressure is 120/57 (abnormal) and her pulse is 83. Her respiration is 19 and oxygen saturation is 100%.   Wt Readings from Last 3 Encounters:  06/17/18 187 lb (84.8 kg)  04/19/18 193 lb 8 oz (87.8 kg)  04/05/18 188 lb (85.3 kg)    Ocular: Sclerae unicteric, pupils equal, round and reactive to light Ear-nose-throat: Oropharynx clear, dentition fair Lymphatic: No cervical, supraclavicular or axillary adenopathy Lungs no rales or rhonchi, good  excursion bilaterally Heart regular rate and rhythm, no murmur appreciated Abd soft, nontender, positive bowel sounds, no liver or spleen tip palpated on exam, no fluid wave  MSK no focal spinal tenderness, no joint edema Neuro: non-focal, well-oriented, appropriate affect Breasts: Deferred  Lab Results  Component Value Date   WBC 6.7 06/17/2018   HGB 11.4 (L) 06/17/2018   HCT 36.0 06/17/2018   MCV 97.6 06/17/2018   PLT 137 (L) 06/17/2018   Lab Results  Component Value Date   FERRITIN 453 (H) 04/19/2018   IRON 72 04/19/2018   TIBC 245 04/19/2018   UIBC 173  04/19/2018   IRONPCTSAT 29 04/19/2018   Lab Results  Component Value Date   RETICCTPCT 1.2 10/16/2017   RBC 3.69 (L) 06/17/2018   No results found for: KPAFRELGTCHN, LAMBDASER, KAPLAMBRATIO No results found for: IGGSERUM, IGA, IGMSERUM No results found for: Odetta Pink, SPEI   Chemistry      Component Value Date/Time   NA 140 06/17/2018 1413   NA 143 07/21/2017 1501   K 4.5 06/17/2018 1413   CL 99 06/17/2018 1413   CO2 33 (H) 06/17/2018 1413   BUN 62 (H) 06/17/2018 1413   BUN 41 (H) 07/21/2017 1501   CREATININE 2.08 (H) 06/17/2018 1413   CREATININE 1.36 (H) 08/23/2013 1134      Component Value Date/Time   CALCIUM 9.6 06/17/2018 1413   ALKPHOS 46 06/17/2018 1413   AST 14 (L) 06/17/2018 1413   ALT 10 06/17/2018 1413   BILITOT 0.6 06/17/2018 1413       Impression and Plan: Ms. Cutrone is a very pleasant 78 yo caucasian female with multifactorial anemia.   Thankfully, today she will not need any Aranesp.  Her hemoglobin is come up quite nicely.  We will have to be careful and watch for the iron studies.  We will see her back in a couple more months.  She was come back sooner if necessary.   Volanda Napoleon, MD 5/28/20203:17 PM

## 2018-06-18 ENCOUNTER — Telehealth: Payer: Self-pay | Admitting: Hematology & Oncology

## 2018-06-18 LAB — IRON AND TIBC
Iron: 98 ug/dL (ref 41–142)
Saturation Ratios: 42 % (ref 21–57)
TIBC: 235 ug/dL — ABNORMAL LOW (ref 236–444)
UIBC: 137 ug/dL (ref 120–384)

## 2018-06-18 LAB — FERRITIN: Ferritin: 350 ng/mL — ABNORMAL HIGH (ref 11–307)

## 2018-06-18 NOTE — Telephone Encounter (Signed)
Spoke with patient to confirm 7/29 appt at 1130 am per 5/28 LOS

## 2018-06-28 LAB — HM DIABETES EYE EXAM

## 2018-06-29 ENCOUNTER — Other Ambulatory Visit: Payer: Self-pay | Admitting: Family Medicine

## 2018-06-29 NOTE — Telephone Encounter (Signed)
Copied from [place] [id number]. Topic: Quick Communication - Rx Refill/Question >> Jun 29, 2018  3:58 PM Alanda Slim E wrote: Medication: albuterol (PROVENTIL HFA;VENTOLIN HFA) 108 (90 Base) MCG/ACT inhaler budesonide-formoterol (SYMBICORT) 160-4.5 MCG/ACT inhaler Accu Chek Aviva plus meter and supplies  Accu chek aviva test strips  Accu chek aviva solution  Accu chek soft click lansits  Alcohol swabbs   Has the patient contacted their pharmacy? Yes - pharmacy called in order   Preferred Pharmacy (with phone number or street name): [facility], [place] [phone] (Phone) [phone] (Fax)    Agent: Please be advised that RX refills may take up to 3 business days. We ask that you follow-up with your pharmacy.

## 2018-06-30 MED ORDER — ALCOHOL WIPES 70 % PADS: MEDICATED_PAD | 1 refills | Status: DC

## 2018-06-30 MED ORDER — BLOOD GLUCOSE METER KIT: PACK | 0 refills | Status: DC

## 2018-06-30 MED ORDER — ALBUTEROL SULFATE HFA 108 (90 BASE) MCG/ACT IN AERS: 2.0000 | INHALATION_SPRAY | Freq: Four times a day (QID) | RESPIRATORY_TRACT | 1 refills | Status: DC | PRN

## 2018-06-30 MED ORDER — BUDESONIDE-FORMOTEROL FUMARATE 160-4.5 MCG/ACT IN AERO: 2.0000 | INHALATION_SPRAY | Freq: Two times a day (BID) | RESPIRATORY_TRACT | 1 refills | Status: DC

## 2018-06-30 NOTE — Telephone Encounter (Signed)
Rxs sent

## 2018-07-07 DIAGNOSIS — E1122 Type 2 diabetes mellitus with diabetic chronic kidney disease: Secondary | ICD-10-CM | POA: Diagnosis not present

## 2018-07-07 DIAGNOSIS — D631 Anemia in chronic kidney disease: Secondary | ICD-10-CM | POA: Diagnosis not present

## 2018-07-07 DIAGNOSIS — N184 Chronic kidney disease, stage 4 (severe): Secondary | ICD-10-CM | POA: Diagnosis not present

## 2018-07-07 DIAGNOSIS — I129 Hypertensive chronic kidney disease with stage 1 through stage 4 chronic kidney disease, or unspecified chronic kidney disease: Secondary | ICD-10-CM | POA: Diagnosis not present

## 2018-07-10 ENCOUNTER — Other Ambulatory Visit: Payer: Self-pay | Admitting: Family Medicine

## 2018-07-12 NOTE — Telephone Encounter (Signed)
Requesting:tramadol Contract:n/a UDS:n/a Last OV:04/05/18 Next OV:n/a Last Refill:05/24/18  #[id number] Database:   Please advise

## 2018-07-13 ENCOUNTER — Other Ambulatory Visit: Payer: Self-pay | Admitting: Family Medicine

## 2018-07-14 ENCOUNTER — Telehealth: Payer: Self-pay

## 2018-07-14 NOTE — Telephone Encounter (Signed)
Copied from [place] [id number]. Topic: General - Other >> Jul 14, 2018  8:57 AM Leward Quan A wrote: Reason for CRM: Patient called to request a letter from Dr Charlett Blake stating that she needs to have her oxygen at all times. She will be flying out on 08/12/2018. [phone]

## 2018-07-16 NOTE — Telephone Encounter (Signed)
Letter typed and sent to patient

## 2018-07-28 DIAGNOSIS — H11153 Pinguecula, bilateral: Secondary | ICD-10-CM | POA: Diagnosis not present

## 2018-07-28 DIAGNOSIS — Z961 Presence of intraocular lens: Secondary | ICD-10-CM | POA: Diagnosis not present

## 2018-07-28 DIAGNOSIS — H0102B Squamous blepharitis left eye, upper and lower eyelids: Secondary | ICD-10-CM | POA: Diagnosis not present

## 2018-07-28 DIAGNOSIS — H40013 Open angle with borderline findings, low risk, bilateral: Secondary | ICD-10-CM | POA: Diagnosis not present

## 2018-07-28 DIAGNOSIS — H35033 Hypertensive retinopathy, bilateral: Secondary | ICD-10-CM | POA: Diagnosis not present

## 2018-07-28 DIAGNOSIS — H04123 Dry eye syndrome of bilateral lacrimal glands: Secondary | ICD-10-CM | POA: Diagnosis not present

## 2018-07-28 DIAGNOSIS — H353131 Nonexudative age-related macular degeneration, bilateral, early dry stage: Secondary | ICD-10-CM | POA: Diagnosis not present

## 2018-07-28 DIAGNOSIS — H0102A Squamous blepharitis right eye, upper and lower eyelids: Secondary | ICD-10-CM | POA: Diagnosis not present

## 2018-07-28 DIAGNOSIS — H18413 Arcus senilis, bilateral: Secondary | ICD-10-CM | POA: Diagnosis not present

## 2018-07-28 LAB — HM DIABETES EYE EXAM

## 2018-08-10 ENCOUNTER — Other Ambulatory Visit: Payer: Self-pay | Admitting: Family

## 2018-08-18 ENCOUNTER — Inpatient Hospital Stay: Payer: Medicare HMO

## 2018-08-18 ENCOUNTER — Inpatient Hospital Stay: Payer: Medicare HMO | Admitting: Family

## 2018-08-19 ENCOUNTER — Other Ambulatory Visit: Payer: Self-pay | Admitting: Family Medicine

## 2018-08-19 NOTE — Telephone Encounter (Signed)
Requesting:tramadol Contract:yes UDS:n/a Last OV:04/05/18 Next OV:n/a Last Refill:07/12/18  #[id number] Database:   Please advise

## 2018-08-23 ENCOUNTER — Encounter: Payer: Self-pay | Admitting: Family

## 2018-08-23 ENCOUNTER — Other Ambulatory Visit: Payer: Self-pay

## 2018-08-23 ENCOUNTER — Inpatient Hospital Stay: Payer: Medicare HMO

## 2018-08-23 ENCOUNTER — Telehealth (HOSPITAL_COMMUNITY): Payer: Self-pay

## 2018-08-23 ENCOUNTER — Telehealth: Payer: Self-pay | Admitting: Family

## 2018-08-23 ENCOUNTER — Inpatient Hospital Stay: Payer: Medicare HMO | Attending: Family | Admitting: Family

## 2018-08-23 VITALS — BP 135/42 | HR 71 | Wt 191.8 lb

## 2018-08-23 DIAGNOSIS — N184 Chronic kidney disease, stage 4 (severe): Secondary | ICD-10-CM

## 2018-08-23 DIAGNOSIS — D631 Anemia in chronic kidney disease: Secondary | ICD-10-CM | POA: Diagnosis not present

## 2018-08-23 DIAGNOSIS — E611 Iron deficiency: Secondary | ICD-10-CM

## 2018-08-23 DIAGNOSIS — D508 Other iron deficiency anemias: Secondary | ICD-10-CM

## 2018-08-23 DIAGNOSIS — Z79899 Other long term (current) drug therapy: Secondary | ICD-10-CM | POA: Diagnosis not present

## 2018-08-23 DIAGNOSIS — N183 Chronic kidney disease, stage 3 (moderate): Secondary | ICD-10-CM | POA: Diagnosis not present

## 2018-08-23 LAB — CMP (CANCER CENTER ONLY)
ALT: 10 U/L (ref 0–44)
AST: 13 U/L — ABNORMAL LOW (ref 15–41)
Albumin: 4 g/dL (ref 3.5–5.0)
Alkaline Phosphatase: 42 U/L (ref 38–126)
Anion gap: 5 (ref 5–15)
BUN: 51 mg/dL — ABNORMAL HIGH (ref 8–23)
CO2: 32 mmol/L (ref 22–32)
Calcium: 9.4 mg/dL (ref 8.9–10.3)
Chloride: 103 mmol/L (ref 98–111)
Creatinine: 1.73 mg/dL — ABNORMAL HIGH (ref 0.44–1.00)
GFR, Est AFR Am: 32 mL/min — ABNORMAL LOW (ref >60)
GFR, Estimated: 28 mL/min — ABNORMAL LOW (ref >60)
Glucose, Bld: 88 mg/dL (ref 70–99)
Potassium: 5.2 mmol/L — ABNORMAL HIGH (ref 3.5–5.1)
Sodium: 140 mmol/L (ref 135–145)
Total Bilirubin: 0.5 mg/dL (ref 0.3–1.2)
Total Protein: 6.3 g/dL — ABNORMAL LOW (ref 6.5–8.1)

## 2018-08-23 LAB — CBC WITH DIFFERENTIAL (CANCER CENTER ONLY)
Abs Immature Granulocytes: 0.05 10*3/uL (ref 0.00–0.07)
Basophils Absolute: 0 10*3/uL (ref 0.0–0.1)
Basophils Relative: 1 %
Eosinophils Absolute: 0.3 10*3/uL (ref 0.0–0.5)
Eosinophils Relative: 6 %
HCT: 32.8 % — ABNORMAL LOW (ref 36.0–46.0)
Hemoglobin: 10.2 g/dL — ABNORMAL LOW (ref 12.0–15.0)
Immature Granulocytes: 1 %
Lymphocytes Relative: 18 %
Lymphs Abs: 1.1 10*3/uL (ref 0.7–4.0)
MCH: 31.7 pg (ref 26.0–34.0)
MCHC: 31.1 g/dL (ref 30.0–36.0)
MCV: 101.9 fL — ABNORMAL HIGH (ref 80.0–100.0)
Monocytes Absolute: 0.7 10*3/uL (ref 0.1–1.0)
Monocytes Relative: 12 %
Neutro Abs: 3.8 10*3/uL (ref 1.7–7.7)
Neutrophils Relative %: 62 %
Platelet Count: 119 10*3/uL — ABNORMAL LOW (ref 150–400)
RBC: 3.22 MIL/uL — ABNORMAL LOW (ref 3.87–5.11)
RDW: 15 % (ref 11.5–15.5)
WBC Count: 5.9 10*3/uL (ref 4.0–10.5)
nRBC: 0 % (ref 0.0–0.2)

## 2018-08-23 LAB — RETICULOCYTES
Immature Retic Fract: 13.1 % (ref 2.3–15.9)
RBC.: 3.24 MIL/uL — ABNORMAL LOW (ref 3.87–5.11)
Retic Count, Absolute: 56.1 10*3/uL (ref 19.0–186.0)
Retic Ct Pct: 1.7 % (ref 0.4–3.1)

## 2018-08-23 MED ORDER — DARBEPOETIN ALFA 300 MCG/0.6ML IJ SOSY
300.0000 ug | PREFILLED_SYRINGE | Freq: Once | INTRAMUSCULAR | Status: AC
  Administered 2018-08-23: 300 ug via SUBCUTANEOUS

## 2018-08-23 MED ORDER — DARBEPOETIN ALFA 300 MCG/0.6ML IJ SOSY
PREFILLED_SYRINGE | INTRAMUSCULAR | Status: AC
  Filled 2018-08-23: qty 0.6

## 2018-08-23 NOTE — Patient Instructions (Signed)
Darbepoetin Alfa injection What is this medicine? DARBEPOETIN ALFA (dar be POE e tin AL fa) helps your body make more red blood cells. It is used to treat anemia caused by chronic kidney failure and chemotherapy. This medicine may be used for other purposes; ask your health care provider or pharmacist if you have questions. COMMON BRAND NAME(S): Aranesp What should I tell my health care provider before I take this medicine? They need to know if you have any of these conditions:  blood clotting disorders or history of blood clots  cancer patient not on chemotherapy  cystic fibrosis  heart disease, such as angina, heart failure, or a history of a heart attack  hemoglobin level of 12 g/dL or greater  high blood pressure  low levels of folate, iron, or vitamin B12  seizures  an unusual or allergic reaction to darbepoetin, erythropoietin, albumin, hamster proteins, latex, other medicines, foods, dyes, or preservatives  pregnant or trying to get pregnant  breast-feeding How should I use this medicine? This medicine is for injection into a vein or under the skin. It is usually given by a health care professional in a hospital or clinic setting. If you get this medicine at home, you will be taught how to prepare and give this medicine. Use exactly as directed. Take your medicine at regular intervals. Do not take your medicine more often than directed. It is important that you put your used needles and syringes in a special sharps container. Do not put them in a trash can. If you do not have a sharps container, call your pharmacist or healthcare provider to get one. A special MedGuide will be given to you by the pharmacist with each prescription and refill. Be sure to read this information carefully each time. Talk to your pediatrician regarding the use of this medicine in children. While this medicine may be used in children as young as 1 month of age for selected conditions, precautions do  apply. Overdosage: If you think you have taken too much of this medicine contact a poison control center or emergency room at once. NOTE: This medicine is only for you. Do not share this medicine with others. What if I miss a dose? If you miss a dose, take it as soon as you can. If it is almost time for your next dose, take only that dose. Do not take double or extra doses. What may interact with this medicine? Do not take this medicine with any of the following medications:  epoetin alfa This list may not describe all possible interactions. Give your health care provider a list of all the medicines, herbs, non-prescription drugs, or dietary supplements you use. Also tell them if you smoke, drink alcohol, or use illegal drugs. Some items may interact with your medicine. What should I watch for while using this medicine? Your condition will be monitored carefully while you are receiving this medicine. You may need blood work done while you are taking this medicine. This medicine may cause a decrease in vitamin B6. You should make sure that you get enough vitamin B6 while you are taking this medicine. Discuss the foods you eat and the vitamins you take with your health care professional. What side effects may I notice from receiving this medicine? Side effects that you should report to your doctor or health care professional as soon as possible:  allergic reactions like skin rash, itching or hives, swelling of the face, lips, or tongue  breathing problems  changes in   vision  chest pain  confusion, trouble speaking or understanding  feeling faint or lightheaded, falls  high blood pressure  muscle aches or pains  pain, swelling, warmth in the leg  rapid weight gain  severe headaches  sudden numbness or weakness of the face, arm or leg  trouble walking, dizziness, loss of balance or coordination  seizures (convulsions)  swelling of the ankles, feet, hands  unusually weak or  tired Side effects that usually do not require medical attention (report to your doctor or health care professional if they continue or are bothersome):  diarrhea  fever, chills (flu-like symptoms)  headaches  nausea, vomiting  redness, stinging, or swelling at site where injected This list may not describe all possible side effects. Call your doctor for medical advice about side effects. You may report side effects to FDA at 1-800-FDA-1088. Where should I keep my medicine? Keep out of the reach of children. Store in a refrigerator between 2 and 8 degrees C (36 and 46 degrees F). Do not freeze. Do not shake. Throw away any unused portion if using a single-dose vial. Throw away any unused medicine after the expiration date. NOTE: This sheet is a summary. It may not cover all possible information. If you have questions about this medicine, talk to your doctor, pharmacist, or health care provider.  2020 Elsevier/Gold Standard (2017-01-21 16:44:20)  

## 2018-08-23 NOTE — Progress Notes (Signed)
Hematology and Oncology Follow Up Visit  [name] [id number] 1940/03/25 78 y.o. 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 is here today for follow-up. She is doing well but has noted occasional mild fatigue and dizziness.  She has not had any falls or syncopal episodes.  She is doing well on 2 L [place] supplemental O2. Her SOB is stable.  No fever, chills, n/v, cough, rash, chest pain, palpitations, abdominal pain or changes in bowel or bladder habits.  No episodes of bleeding. She does bruise but not in excess. No petechiae.  The swelling in her lower extremities is stable and she states improves when she props her feet up.  She has numbness and tingling in her fingertips that comes and goes.  She has a good appetite and is staying well hydrated. Her weight is stable.   ECOG Performance Status: 1 - Symptomatic but completely ambulatory  Medications:  Allergies as of 08/23/2018      Reactions   Montelukast Sodium Other (See Comments)   REACTION: HEART PALPITATIONS, CHEST PAIN.   Sulfa Antibiotics Other (See Comments)   CHEST PAIN   Sulfonamide Derivatives Other (See Comments)   CHEST PAIN   Oseltamivir Phosphate Diarrhea, Nausea And Vomiting, Other (See Comments)   TAMIFLU REACTION:  dizziness   Ace Inhibitors Other (See Comments)   PT. STATED UNKNOWN REACTION   Indomethacin Other (See Comments)   PT. STATED UNKNOWN REACTION      Medication List       Accurate as of August 23, 2018  2:51 PM. If you have any questions, ask your nurse or doctor.        Accu-Chek Aviva Plus test strip Generic drug: glucose blood   Accu-Chek Aviva Soln   Accu-Chek Softclix Lancets lancets   acetaminophen 500 MG tablet Commonly known as: TYLENOL Take 500 mg by mouth 2 (two) times daily.   albuterol 0.63 MG/3ML nebulizer solution Commonly  known as: ACCUNEB Take 3 mLs (0.63 mg total) by nebulization every 6 (six) hours as needed for wheezing or shortness of breath.   albuterol 108 (90 Base) MCG/ACT inhaler Commonly known as: VENTOLIN HFA Inhale 2 puffs into the lungs 4 (four) times daily as needed for wheezing or shortness of breath.   Alcohol Wipes 70 % Pads To use before checking blood sugars   allopurinol 100 MG tablet Commonly known as: ZYLOPRIM TAKE 2 TABLETS ONE TIME DAILY   b complex vitamins tablet Take 1 tablet by mouth daily.   benzonatate 100 MG capsule Commonly known as: TESSALON Take 1 capsule (100 mg total) by mouth 3 (three) times daily as needed for cough.   blood glucose meter kit and supplies Check blood sugars once weekly.   budesonide-formoterol 160-4.5 MCG/ACT inhaler Commonly known as: Symbicort Inhale 2 puffs into the lungs 2 (two) times daily.   cholecalciferol 1000 units tablet Commonly known as: VITAMIN D Take 1,000 Units daily by mouth.   doxycycline 100 MG tablet Commonly known as: VIBRA-TABS Take 1 tablet (100 mg total) by mouth 2 (two) times daily.   famotidine 20 MG tablet Commonly known as: Pepcid Take 1 tablet (20 mg total) by mouth daily.   Ferrous Fumarate-Folic Acid 412-8 MG Tabs Commonly known as: Hemocyte-F Take 1 tablet by mouth daily.   fluconazole 150 MG tablet Commonly known as:  DIFLUCAN TAKE ONE TABLET BY MOUTH AS A ONE TIME DOSE   fluticasone 50 MCG/ACT nasal spray Commonly known as: FLONASE Place 1 spray into both nostrils daily. What changed:   when to take this  reasons to take this   furosemide 40 MG tablet Commonly known as: LASIX TAKE 1 TABLET BY MOUTH DAILY AND A 2ND TABLET DAILY AS NEEDED FOR INCREASED EDEMA OR WEIGHT GAIN >3 LBS/24 HOUR   loratadine 10 MG tablet Commonly known as: CLARITIN Take 1 tablet (10 mg total) by mouth at bedtime. What changed:   when to take this  reasons to take this   metoprolol succinate 50 MG 24 hr  tablet Commonly known as: TOPROL-XL TAKE 1 AND 1/2 TABLETS EVERY DAY   montelukast 10 MG tablet Commonly known as: SINGULAIR Take 1 tablet (10 mg total) by mouth at bedtime.   Olopatadine HCl 0.2 % Soln   OXYGEN Inhale 2 L into the lungs continuous.   pantoprazole 40 MG tablet Commonly known as: PROTONIX Take 40 mg by mouth 2 (two) times daily.   potassium chloride SA 20 MEQ tablet Commonly known as: K-DUR Take 1 tablet (20 mEq total) by mouth See admin instructions. And a 2nd tab po daily prn extra Furosemide tab   pravastatin 20 MG tablet Commonly known as: PRAVACHOL Take 1 tablet (20 mg total) by mouth every evening.   PROBIOTIC PO Take 1 tablet by mouth daily.   sertraline 100 MG tablet Commonly known as: ZOLOFT Take 1 tablet (100 mg total) by mouth daily.   traMADol 50 MG tablet Commonly known as: ULTRAM TAKE 1 TABLET BY MOUTH THREE TIMES DAILY AS NEEDED FOR MODERATE PAIN   trolamine salicylate 10 % cream Commonly known as: ASPERCREME Apply 1 application topically as needed for muscle pain.   vitamin B-12 1000 MCG tablet Commonly known as: CYANOCOBALAMIN Take 1,000 mcg by mouth daily.       Allergies:  Allergies  Allergen Reactions  . Montelukast Sodium Other (See Comments)    REACTION: HEART PALPITATIONS, CHEST PAIN.  Marland Kitchen Sulfa Antibiotics Other (See Comments)    CHEST PAIN  . Sulfonamide Derivatives Other (See Comments)    CHEST PAIN  . Oseltamivir Phosphate Diarrhea, Nausea And Vomiting and Other (See Comments)    TAMIFLU REACTION:  dizziness  . Ace Inhibitors Other (See Comments)    PT. STATED UNKNOWN REACTION  . Indomethacin Other (See Comments)    PT. STATED UNKNOWN REACTION    Past Medical History, Surgical history, Social history, and Family History were reviewed and updated.  Review of Systems: All other 10 point review of systems is negative.   Physical Exam:  weight is 191 lb 12.8 oz (87 kg). Her blood pressure is 135/42 (abnormal)  and her pulse is 71. Her oxygen saturation is 99%.   Wt Readings from Last 3 Encounters:  08/23/18 191 lb 12.8 oz (87 kg)  06/17/18 187 lb (84.8 kg)  04/19/18 193 lb 8 oz (87.8 kg)    Ocular: Sclerae unicteric, pupils equal, round and reactive to light Ear-nose-throat: Oropharynx clear, dentition fair Lymphatic: No cervical or supraclavicular adenopathy Lungs no rales or rhonchi, good excursion bilaterally Heart regular rate and rhythm, no murmur appreciated Abd soft, nontender, positive bowel sounds, no liver or spleen tip palpated on exam, no fluid wave  MSK no focal spinal tenderness, no joint edema Neuro: non-focal, well-oriented, appropriate affect Breasts: Deferred   Lab Results  Component Value Date   WBC 5.9 08/23/2018  HGB 10.2 (L) 08/23/2018   HCT 32.8 (L) 08/23/2018   MCV 101.9 (H) 08/23/2018   PLT 119 (L) 08/23/2018   Lab Results  Component Value Date   FERRITIN 350 (H) 06/17/2018   IRON 98 06/17/2018   TIBC 235 (L) 06/17/2018   UIBC 137 06/17/2018   IRONPCTSAT 42 06/17/2018   Lab Results  Component Value Date   RETICCTPCT 1.7 08/23/2018   RBC 3.24 (L) 08/23/2018   No results found for: KPAFRELGTCHN, LAMBDASER, KAPLAMBRATIO No results found for: IGGSERUM, IGA, IGMSERUM No results found for: Odetta Pink, SPEI   Chemistry      Component Value Date/Time   NA 140 06/17/2018 1413   NA 143 07/21/2017 1501   K 4.5 06/17/2018 1413   CL 99 06/17/2018 1413   CO2 33 (H) 06/17/2018 1413   BUN 62 (H) 06/17/2018 1413   BUN 41 (H) 07/21/2017 1501   CREATININE 2.08 (H) 06/17/2018 1413   CREATININE 1.36 (H) 08/23/2013 1134      Component Value Date/Time   CALCIUM 9.6 06/17/2018 1413   ALKPHOS 46 06/17/2018 1413   AST 14 (L) 06/17/2018 1413   ALT 10 06/17/2018 1413   BILITOT 0.6 06/17/2018 1413       Impression and Plan: Ms. Billman is a very pleasant 78 yo caucasian female with multifactorial anemia.   Hgb is 10.2 so she did receive Aranesp today.  We will see what her iron studies show and bring her back in for infusion next week if needed.  We will plan to see her back in another 2 months.  She will contact our office with any questions or concerns. We can certainly see her sooner if needed.   Laverna Peace, NP 8/3/20202:51 PM

## 2018-08-23 NOTE — Telephone Encounter (Signed)
Appointments scheduled patient notified per 8/3 los

## 2018-08-23 NOTE — Telephone Encounter (Signed)
Called to schedule f/u mra, no answer, left vm. AW 

## 2018-08-24 LAB — FERRITIN: Ferritin: 453 ng/mL — ABNORMAL HIGH (ref 11–307)

## 2018-08-24 LAB — IRON AND TIBC
Iron: 78 ug/dL (ref 41–142)
Saturation Ratios: 29 % (ref 21–57)
TIBC: 264 ug/dL (ref 236–444)
UIBC: 186 ug/dL (ref 120–384)

## 2018-08-27 ENCOUNTER — Other Ambulatory Visit: Payer: Self-pay | Admitting: Family Medicine

## 2018-08-31 ENCOUNTER — Other Ambulatory Visit: Payer: Self-pay | Admitting: Internal Medicine

## 2018-08-31 ENCOUNTER — Other Ambulatory Visit: Payer: Self-pay

## 2018-08-31 MED ORDER — MONTELUKAST SODIUM 10 MG PO TABS: 10.0000 mg | ORAL_TABLET | Freq: Every day | ORAL | 0 refills | Status: DC

## 2018-10-09 ENCOUNTER — Other Ambulatory Visit: Payer: Self-pay | Admitting: Family Medicine

## 2018-10-11 NOTE — Telephone Encounter (Signed)
Requesting: tramadol  Contract:yes UDS:n/a Last OV:01/25/18 Next OV:n/a Last Refill:08/19/18  #[id number] Database:   Please advise

## 2018-10-14 ENCOUNTER — Other Ambulatory Visit (HOSPITAL_COMMUNITY): Payer: Self-pay | Admitting: Interventional Radiology

## 2018-10-14 DIAGNOSIS — I609 Nontraumatic subarachnoid hemorrhage, unspecified: Secondary | ICD-10-CM

## 2018-10-22 ENCOUNTER — Inpatient Hospital Stay: Payer: Medicare HMO

## 2018-10-22 ENCOUNTER — Inpatient Hospital Stay: Payer: Medicare HMO | Attending: Family

## 2018-10-22 ENCOUNTER — Inpatient Hospital Stay (HOSPITAL_BASED_OUTPATIENT_CLINIC_OR_DEPARTMENT_OTHER): Payer: Medicare HMO | Admitting: Family

## 2018-10-22 ENCOUNTER — Other Ambulatory Visit: Payer: Self-pay

## 2018-10-22 ENCOUNTER — Encounter: Payer: Self-pay | Admitting: Family

## 2018-10-22 VITALS — BP 109/47 | HR 69 | Temp 97.1°F | Resp 20 | Wt 193.0 lb

## 2018-10-22 DIAGNOSIS — D508 Other iron deficiency anemias: Secondary | ICD-10-CM

## 2018-10-22 DIAGNOSIS — D631 Anemia in chronic kidney disease: Secondary | ICD-10-CM | POA: Diagnosis not present

## 2018-10-22 DIAGNOSIS — N183 Chronic kidney disease, stage 3 unspecified: Secondary | ICD-10-CM | POA: Diagnosis not present

## 2018-10-22 DIAGNOSIS — Z79899 Other long term (current) drug therapy: Secondary | ICD-10-CM | POA: Diagnosis not present

## 2018-10-22 LAB — CMP (CANCER CENTER ONLY)
ALT: 12 U/L (ref 0–44)
AST: 15 U/L (ref 15–41)
Albumin: 4.1 g/dL (ref 3.5–5.0)
Alkaline Phosphatase: 50 U/L (ref 38–126)
Anion gap: 11 (ref 5–15)
BUN: 52 mg/dL — ABNORMAL HIGH (ref 8–23)
CO2: 29 mmol/L (ref 22–32)
Calcium: 9.4 mg/dL (ref 8.9–10.3)
Chloride: 99 mmol/L (ref 98–111)
Creatinine: 2.04 mg/dL — ABNORMAL HIGH (ref 0.44–1.00)
GFR, Est AFR Am: 27 mL/min — ABNORMAL LOW (ref >60)
GFR, Estimated: 23 mL/min — ABNORMAL LOW (ref >60)
Glucose, Bld: 94 mg/dL (ref 70–99)
Potassium: 4.6 mmol/L (ref 3.5–5.1)
Sodium: 139 mmol/L (ref 135–145)
Total Bilirubin: 0.7 mg/dL (ref 0.3–1.2)
Total Protein: 7 g/dL (ref 6.5–8.1)

## 2018-10-22 LAB — RETICULOCYTES
Immature Retic Fract: 10.8 % (ref 2.3–15.9)
RBC.: 3.27 MIL/uL — ABNORMAL LOW (ref 3.87–5.11)
Retic Count, Absolute: 39.2 10*3/uL (ref 19.0–186.0)
Retic Ct Pct: 1.2 % (ref 0.4–3.1)

## 2018-10-22 LAB — CBC WITH DIFFERENTIAL (CANCER CENTER ONLY)
Band Neutrophils: 0 %
Basophils Absolute: 0 10*3/uL (ref 0.0–0.1)
Basophils Relative: 0 %
Eosinophils Absolute: 0.3 10*3/uL (ref 0.0–0.5)
Eosinophils Relative: 5 %
HCT: 33.5 % — ABNORMAL LOW (ref 36.0–46.0)
Hemoglobin: 10.5 g/dL — ABNORMAL LOW (ref 12.0–15.0)
Lymphocytes Relative: 128 %
Lymphs Abs: 1 10*3/uL (ref 0.7–4.0)
MCH: 31.9 pg (ref 26.0–34.0)
MCHC: 31.3 g/dL (ref 30.0–36.0)
MCV: 101.8 fL — ABNORMAL HIGH (ref 80.0–100.0)
Monocytes Absolute: 0.7 10*3/uL (ref 0.1–1.0)
Monocytes Relative: 13 %
Neutro Abs: 3.4 10*3/uL (ref 1.7–7.7)
Neutrophils Relative %: 63 %
Platelet Count: 120 10*3/uL — ABNORMAL LOW (ref 150–400)
RBC: 3.29 MIL/uL — ABNORMAL LOW (ref 3.87–5.11)
RDW: 13.2 % (ref 11.5–15.5)
WBC Count: 5.4 10*3/uL (ref 4.0–10.5)
nRBC: 0 % (ref 0.0–0.2)

## 2018-10-22 MED ORDER — DARBEPOETIN ALFA 300 MCG/0.6ML IJ SOSY
PREFILLED_SYRINGE | INTRAMUSCULAR | Status: AC
  Filled 2018-10-22: qty 0.6

## 2018-10-22 MED ORDER — DARBEPOETIN ALFA 300 MCG/0.6ML IJ SOSY
300.0000 ug | PREFILLED_SYRINGE | Freq: Once | INTRAMUSCULAR | Status: AC
  Administered 2018-10-22: 15:00:00 300 ug via SUBCUTANEOUS

## 2018-10-22 NOTE — Patient Instructions (Signed)
Darbepoetin Alfa injection What is this medicine? DARBEPOETIN ALFA (dar be POE e tin AL fa) helps your body make more red blood cells. It is used to treat anemia caused by chronic kidney failure and chemotherapy. This medicine may be used for other purposes; ask your health care provider or pharmacist if you have questions. COMMON BRAND NAME(S): Aranesp What should I tell my health care provider before I take this medicine? They need to know if you have any of these conditions:  blood clotting disorders or history of blood clots  cancer patient not on chemotherapy  cystic fibrosis  heart disease, such as angina, heart failure, or a history of a heart attack  hemoglobin level of 12 g/dL or greater  high blood pressure  low levels of folate, iron, or vitamin B12  seizures  an unusual or allergic reaction to darbepoetin, erythropoietin, albumin, hamster proteins, latex, other medicines, foods, dyes, or preservatives  pregnant or trying to get pregnant  breast-feeding How should I use this medicine? This medicine is for injection into a vein or under the skin. It is usually given by a health care professional in a hospital or clinic setting. If you get this medicine at home, you will be taught how to prepare and give this medicine. Use exactly as directed. Take your medicine at regular intervals. Do not take your medicine more often than directed. It is important that you put your used needles and syringes in a special sharps container. Do not put them in a trash can. If you do not have a sharps container, call your pharmacist or healthcare provider to get one. A special MedGuide will be given to you by the pharmacist with each prescription and refill. Be sure to read this information carefully each time. Talk to your pediatrician regarding the use of this medicine in children. While this medicine may be used in children as young as 1 month of age for selected conditions, precautions do  apply. Overdosage: If you think you have taken too much of this medicine contact a poison control center or emergency room at once. NOTE: This medicine is only for you. Do not share this medicine with others. What if I miss a dose? If you miss a dose, take it as soon as you can. If it is almost time for your next dose, take only that dose. Do not take double or extra doses. What may interact with this medicine? Do not take this medicine with any of the following medications:  epoetin alfa This list may not describe all possible interactions. Give your health care provider a list of all the medicines, herbs, non-prescription drugs, or dietary supplements you use. Also tell them if you smoke, drink alcohol, or use illegal drugs. Some items may interact with your medicine. What should I watch for while using this medicine? Your condition will be monitored carefully while you are receiving this medicine. You may need blood work done while you are taking this medicine. This medicine may cause a decrease in vitamin B6. You should make sure that you get enough vitamin B6 while you are taking this medicine. Discuss the foods you eat and the vitamins you take with your health care professional. What side effects may I notice from receiving this medicine? Side effects that you should report to your doctor or health care professional as soon as possible:  allergic reactions like skin rash, itching or hives, swelling of the face, lips, or tongue  breathing problems  changes in   vision  chest pain  confusion, trouble speaking or understanding  feeling faint or lightheaded, falls  high blood pressure  muscle aches or pains  pain, swelling, warmth in the leg  rapid weight gain  severe headaches  sudden numbness or weakness of the face, arm or leg  trouble walking, dizziness, loss of balance or coordination  seizures (convulsions)  swelling of the ankles, feet, hands  unusually weak or  tired Side effects that usually do not require medical attention (report to your doctor or health care professional if they continue or are bothersome):  diarrhea  fever, chills (flu-like symptoms)  headaches  nausea, vomiting  redness, stinging, or swelling at site where injected This list may not describe all possible side effects. Call your doctor for medical advice about side effects. You may report side effects to FDA at 1-800-FDA-1088. Where should I keep my medicine? Keep out of the reach of children. Store in a refrigerator between 2 and 8 degrees C (36 and 46 degrees F). Do not freeze. Do not shake. Throw away any unused portion if using a single-dose vial. Throw away any unused medicine after the expiration date. NOTE: This sheet is a summary. It may not cover all possible information. If you have questions about this medicine, talk to your doctor, pharmacist, or health care provider.  2020 Elsevier/Gold Standard (2017-01-21 16:44:20)  

## 2018-10-22 NOTE — Progress Notes (Signed)
Hematology and Oncology Follow Up Visit  [name] [id number] [birth year] 78 y.o. 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 She is doing well but has some occasional fatigue.  Her SOB is stable on 2 L [place] supplemental O2.  No bleeding, bruising or petechiae.  No fever, chills, n/v, cough, rash, dizziness, chest pain, palpitations, abdominal pain or changes in bowel or bladder habits.  She has some swelling at times in her feet and ankles. She is on lasix daily to help reduce fluid retention.  No tenderness, numbness or tingling in her extremities.  No falls or syncopal episodes to report. She uses her walker when ambulating for support.  She has maintained a good appetite and is staying well hydrated. Her weight is stable.   ECOG Performance Status: 1 - Symptomatic but completely ambulatory  Medications:  Allergies as of 10/22/2018      Reactions   Montelukast Sodium Other (See Comments)   REACTION: HEART PALPITATIONS, CHEST PAIN.   Sulfa Antibiotics Other (See Comments)   CHEST PAIN   Sulfonamide Derivatives Other (See Comments)   CHEST PAIN   Oseltamivir Phosphate Diarrhea, Nausea And Vomiting, Other (See Comments)   TAMIFLU REACTION:  dizziness   Ace Inhibitors Other (See Comments)   PT. STATED UNKNOWN REACTION   Indomethacin Other (See Comments)   PT. STATED UNKNOWN REACTION      Medication List       Accurate as of October 22, 2018  1:37 PM. If you have any questions, ask your nurse or doctor.        Accu-Chek Aviva Plus test strip Generic drug: glucose blood   Accu-Chek Aviva Soln   Accu-Chek Softclix Lancets lancets   acetaminophen 500 MG tablet Commonly known as: TYLENOL Take 500 mg by mouth 2 (two) times daily.   albuterol 0.63 MG/3ML nebulizer solution Commonly known  as: ACCUNEB Take 3 mLs (0.63 mg total) by nebulization every 6 (six) hours as needed for wheezing or shortness of breath.   albuterol 108 (90 Base) MCG/ACT inhaler Commonly known as: VENTOLIN HFA Inhale 2 puffs into the lungs 4 (four) times daily as needed for wheezing or shortness of breath.   Alcohol Wipes 70 % Pads To use before checking blood sugars   allopurinol 100 MG tablet Commonly known as: ZYLOPRIM TAKE 2 TABLETS ONE TIME DAILY   b complex vitamins tablet Take 1 tablet by mouth daily.   benzonatate 100 MG capsule Commonly known as: TESSALON Take 1 capsule (100 mg total) by mouth 3 (three) times daily as needed for cough.   blood glucose meter kit and supplies Check blood sugars once weekly.   budesonide-formoterol 160-4.5 MCG/ACT inhaler Commonly known as: Symbicort Inhale 2 puffs into the lungs 2 (two) times daily.   cholecalciferol 1000 units tablet Commonly known as: VITAMIN D Take 1,000 Units daily by mouth.   doxycycline 100 MG tablet Commonly known as: VIBRA-TABS Take 1 tablet (100 mg total) by mouth 2 (two) times daily.   famotidine 20 MG tablet Commonly known as: Pepcid Take 1 tablet (20 mg total) by mouth daily.   Ferrous Fumarate-Folic Acid 336-1 MG Tabs Commonly known as: Hemocyte-F Take 1 tablet by mouth daily.   fluconazole 150 MG tablet Commonly known as: DIFLUCAN TAKE ONE TABLET BY MOUTH  AS A ONE TIME DOSE   fluticasone 50 MCG/ACT nasal spray Commonly known as: FLONASE Place 1 spray into both nostrils daily. What changed:   when to take this  reasons to take this   furosemide 40 MG tablet Commonly known as: LASIX TAKE 1 TABLET BY MOUTH DAILY AND A 2ND TABLET DAILY AS NEEDED FOR INCREASED EDEMA OR WEIGHT GAIN >3 LBS/24 HOUR   loratadine 10 MG tablet Commonly known as: CLARITIN Take 1 tablet (10 mg total) by mouth at bedtime. What changed:   when to take this  reasons to take this   metoprolol succinate 50 MG 24 hr tablet  Commonly known as: TOPROL-XL TAKE 1 AND 1/2 TABLETS EVERY DAY   montelukast 10 MG tablet Commonly known as: SINGULAIR Take 1 tablet (10 mg total) by mouth at bedtime.   Olopatadine HCl 0.2 % Soln   OXYGEN Inhale 2 L into the lungs continuous.   pantoprazole 40 MG tablet Commonly known as: PROTONIX Take 40 mg by mouth 2 (two) times daily.   potassium chloride SA 20 MEQ tablet Commonly known as: KLOR-CON TAKE 1 TABLET DAILY, AND TAKE A SECOND TABLET DAILY AS NEEDED FOR EXTRA FUROSEMIDE TABLET AS DIRECTED.   pravastatin 20 MG tablet Commonly known as: PRAVACHOL Take 1 tablet (20 mg total) by mouth every evening.   PROBIOTIC PO Take 1 tablet by mouth daily.   sertraline 100 MG tablet Commonly known as: ZOLOFT TAKE 1 TABLET EVERY DAY   traMADol 50 MG tablet Commonly known as: ULTRAM TAKE 1 TABLET BY MOUTH THREE TIMES DAILY AS NEEDED FOR MODERATE PAIN   trolamine salicylate 10 % cream Commonly known as: ASPERCREME Apply 1 application topically as needed for muscle pain.   vitamin B-12 1000 MCG tablet Commonly known as: CYANOCOBALAMIN Take 1,000 mcg by mouth daily.       Allergies:  Allergies  Allergen Reactions  . Montelukast Sodium Other (See Comments)    REACTION: HEART PALPITATIONS, CHEST PAIN.  Marland Kitchen Sulfa Antibiotics Other (See Comments)    CHEST PAIN  . Sulfonamide Derivatives Other (See Comments)    CHEST PAIN  . Oseltamivir Phosphate Diarrhea, Nausea And Vomiting and Other (See Comments)    TAMIFLU REACTION:  dizziness  . Ace Inhibitors Other (See Comments)    PT. STATED UNKNOWN REACTION  . Indomethacin Other (See Comments)    PT. STATED UNKNOWN REACTION    Past Medical History, Surgical history, Social history, and Family History were reviewed and updated.  Review of Systems: All other 10 point review of systems is negative.   Physical Exam:  vitals were not taken for this visit.   Wt Readings from Last 3 Encounters:  08/23/18 191 lb 12.8 oz (87  kg)  06/17/18 187 lb (84.8 kg)  04/19/18 193 lb 8 oz (87.8 kg)    Ocular: Sclerae unicteric, pupils equal, round and reactive to light Ear-nose-throat: Oropharynx clear, dentition fair Lymphatic: No cervical or supraclavicular adenopathy Lungs no rales or rhonchi, good excursion bilaterally Heart regular rate and rhythm, no murmur appreciated Abd soft, nontender, positive bowel sounds, no liver or spleen tip palpated on exam, no fluid wave  MSK no focal spinal tenderness, no joint edema Neuro: non-focal, well-oriented, appropriate affect Breasts: Deferred   Lab Results  Component Value Date   WBC 5.9 08/23/2018   HGB 10.2 (L) 08/23/2018   HCT 32.8 (L) 08/23/2018   MCV 101.9 (H) 08/23/2018   PLT 119 (L) 08/23/2018   Lab Results  Component Value  Date   FERRITIN 453 (H) 08/23/2018   IRON 78 08/23/2018   TIBC 264 08/23/2018   UIBC 186 08/23/2018   IRONPCTSAT 29 08/23/2018   Lab Results  Component Value Date   RETICCTPCT 1.7 08/23/2018   RBC 3.24 (L) 08/23/2018   No results found for: KPAFRELGTCHN, LAMBDASER, KAPLAMBRATIO No results found for: IGGSERUM, IGA, IGMSERUM No results found for: Odetta Pink, SPEI   Chemistry      Component Value Date/Time   NA 140 08/23/2018 1414   NA 143 07/21/2017 1501   K 5.2 (H) 08/23/2018 1414   CL 103 08/23/2018 1414   CO2 32 08/23/2018 1414   BUN 51 (H) 08/23/2018 1414   BUN 41 (H) 07/21/2017 1501   CREATININE 1.73 (H) 08/23/2018 1414   CREATININE 1.36 (H) 08/23/2013 1134      Component Value Date/Time   CALCIUM 9.4 08/23/2018 1414   ALKPHOS 42 08/23/2018 1414   AST 13 (L) 08/23/2018 1414   ALT 10 08/23/2018 1414   BILITOT 0.5 08/23/2018 1414       Impression and Plan: Ms. Dawe is a very pleasant 78 yo caucasian female with multifactorial anemia.  Hct today is 10.5 so we will proceed with Aranesp injection.  We will see her again in 2 months.  She will contact our  office with any questions or concerns. We can certainly see her sooner if needed.   Laverna Peace, NP 10/2/20201:37 PM

## 2018-10-25 ENCOUNTER — Telehealth: Payer: Self-pay | Admitting: *Deleted

## 2018-10-25 LAB — IRON AND TIBC
Iron: 104 ug/dL (ref 41–142)
Saturation Ratios: 41 % (ref 21–57)
TIBC: 251 ug/dL (ref 236–444)
UIBC: 147 ug/dL (ref 120–384)

## 2018-10-25 LAB — FERRITIN: Ferritin: 414 ng/mL — ABNORMAL HIGH (ref 11–307)

## 2018-10-25 NOTE — Telephone Encounter (Signed)
Message received from patient requesting most recent labs be mailed to her.  Labs mailed per pt.'s request.

## 2018-10-26 ENCOUNTER — Encounter: Payer: Self-pay | Admitting: Gastroenterology

## 2018-11-04 ENCOUNTER — Ambulatory Visit (HOSPITAL_COMMUNITY)
Admission: RE | Admit: 2018-11-04 | Discharge: 2018-11-04 | Disposition: A | Discharge status: Home / Self Care | Payer: Medicare HMO | Source: Ambulatory Visit | Attending: Interventional Radiology | Admitting: Interventional Radiology

## 2018-11-04 ENCOUNTER — Other Ambulatory Visit: Payer: Self-pay

## 2018-11-04 DIAGNOSIS — I609 Nontraumatic subarachnoid hemorrhage, unspecified: Secondary | ICD-10-CM | POA: Insufficient documentation

## 2018-11-08 ENCOUNTER — Other Ambulatory Visit (HOSPITAL_COMMUNITY): Payer: Self-pay | Admitting: Interventional Radiology

## 2018-11-08 DIAGNOSIS — I609 Nontraumatic subarachnoid hemorrhage, unspecified: Secondary | ICD-10-CM

## 2018-11-11 ENCOUNTER — Other Ambulatory Visit: Payer: Self-pay

## 2018-11-11 ENCOUNTER — Encounter: Payer: Self-pay | Admitting: Internal Medicine

## 2018-11-11 ENCOUNTER — Ambulatory Visit: Payer: Medicare HMO | Admitting: Internal Medicine

## 2018-11-11 DIAGNOSIS — J9611 Chronic respiratory failure with hypoxia: Secondary | ICD-10-CM

## 2018-11-11 DIAGNOSIS — J9612 Chronic respiratory failure with hypercapnia: Secondary | ICD-10-CM | POA: Diagnosis not present

## 2018-11-11 DIAGNOSIS — J4530 Mild persistent asthma, uncomplicated: Secondary | ICD-10-CM | POA: Diagnosis not present

## 2018-11-11 DIAGNOSIS — Z23 Encounter for immunization: Secondary | ICD-10-CM | POA: Diagnosis not present

## 2018-11-11 NOTE — Patient Instructions (Addendum)
We will order a home 02 concentrator today at 1lpm   For now 02 rec is for 1lpm 24/7    Please schedule a follow up visit in 6 months but call sooner if needed  -correction :  02 flow should be 2lpm per POC

## 2018-11-11 NOTE — Progress Notes (Signed)
[name], female    DOB: November 21, 1940,   MRN: [id number]   Brief patient profile:  57 yowf  never smoker  new onset breathing problems 2011 eval by Dr Shirley Friar with dx asthma/bronchitis/copd with daily symptoms of sob no better with inhalers x sev years  Then  became 02 dep arouund 2017 referred to pulmonary clinic 02/22/2018 by Dr   Gwyneth Revels  With pft 04/26/13 c/w minimal airflow obst by curvature of f/v though overall pattern was restrictive assoc with mild kyphosis and chronic L HD elevation   History of Present Illness  02/22/2018  Pulmonary/ 1st office eval/[name]  Chief Complaint  Patient presents with  . Consult    Shortness of breath  Dyspnea:  MMRC2 = can't walk a nl pace on a flat grade s sob but does fine slow and flat only a little better with 02  Cough: constant sense something stuck in throat but min mucoid production - worse with exp to smoke or strong scents  Sleep: 45 degrees with electric bed  - lower levels has more trouble breathng SABA use: increased use since d/c'd singulair  rec Plan A = Automatic = Symbicort 160 Take 2 puffs first thing in am and then another 2 puffs about 12 hours later.  Work on inhaler technique:     Plan B = Backup Only use your albuterol inhaler  Plan C = Crisis - only use your albuterol nebulizer if you first try Plan B and it fails to help > ok to use the nebulizer up to every 4 hours but if start needing it regularly call for immediate appointment Goal is to keep your 02 saturations above 90%  Please schedule a follow up office visit in 4 weeks, sooner if needed with pfts on return     03/22/2018  f/u ov/[name] re:  ? Asthma on symb 160 poor hfa/ singulair  Chief Complaint  Patient presents with  . Follow-up    PFT's done today. Breathing is overall doing well. She is using her albuterol inhaler 5 x per wk on average.   Dyspnea:  MMRC2 = can't walk a nl pace on a flat grade s sob but does fine slow and flat shopping  Cough:  minimal  Sleeping: 45 degrees x 5 years  SABA use: way too much, used neb 3 h prior to pft testing "thought I was supposed to be using twice daily" - it's what I was told (see last ov that occurred well after she was told this) 02: 2lpm 24/7  rec Plan A = Automatic = Symbicort 160 Take 2 puffs first thing in am and then another 2 puffs about 12 hours later.  Work on inhaler technique:  Plan B = Backup Only use your albuterol inhaler as a rescue medication Plan C = Crisis - only use your albuterol nebulizer if you first try Plan B Goal is to keep your 02 saturations above 90%    11/11/2018  f/u ov/[name] re:  Chronic asthma / 02 dep  Chief Complaint  Patient presents with  . Follow-up    Breathing is overall doing well and no new co's. She is using her albuterol inhaler 5 x per wk and rarely uses neb.    Dyspnea:  Slow shopping x [employer] = MMRC2 = can't walk a nl pace on a flat grade s sob but does fine slow and flat on 1 lpm Cough: none Sleeping: 45 degrees elevated x years  SABA  use: occ if overdo it  02: 1lpm 24/7 hs and 2lpm with activity on POC    No obvious day to day or daytime variability or assoc excess/ purulent sputum or mucus plugs or hemoptysis or cp or chest tightness, subjective wheeze or overt sinus or hb symptoms.   Sleeping as above  without nocturnal  or early am exacerbation  of respiratory  c/o's or need for noct saba. Also denies any obvious fluctuation of symptoms with weather or environmental changes or other aggravating or alleviating factors except as outlined above   No unusual exposure hx or h/o childhood pna/ asthma or knowledge of premature birth.  Current Allergies, Complete Past Medical History, Past Surgical History, Family History, and Social History were reviewed in [employer] record.  ROS  The following are not active complaints unless bolded Hoarseness, sore throat, dysphagia, dental problems, itching, sneezing,  nasal  congestion or discharge of excess mucus or purulent secretions, ear ache,   fever, chills, sweats, unintended wt loss or wt gain, classically pleuritic or exertional cp,  orthopnea pnd or arm/hand swelling  or leg swelling, presyncope, palpitations, abdominal pain, anorexia, nausea, vomiting, diarrhea  or change in bowel habits or change in bladder habits, change in stools or change in urine, dysuria, hematuria,  rash, arthralgias, visual complaints, headache, numbness, weakness or ataxia or problems with walking or coordination,  change in mood or  memory.        Current Meds  Medication Sig  . ACCU-CHEK AVIVA PLUS test strip   . Accu-Chek Softclix Lancets lancets   . acetaminophen (TYLENOL) 500 MG tablet Take 500 mg by mouth 2 (two) times daily.   Marland Kitchen albuterol (ACCUNEB) 0.63 MG/3ML nebulizer solution Take 3 mLs (0.63 mg total) by nebulization every 6 (six) hours as needed for wheezing or shortness of breath.  Marland Kitchen albuterol (VENTOLIN HFA) 108 (90 Base) MCG/ACT inhaler Inhale 2 puffs into the lungs 4 (four) times daily as needed for wheezing or shortness of breath.  . Alcohol Swabs (ALCOHOL WIPES) 70 % PADS To use before checking blood sugars  . allopurinol (ZYLOPRIM) 100 MG tablet TAKE 2 TABLETS ONE TIME DAILY  . b complex vitamins tablet Take 1 tablet by mouth daily.  . benzonatate (TESSALON) 100 MG capsule Take 1 capsule (100 mg total) by mouth 3 (three) times daily as needed for cough.  . Blood Glucose Calibration (ACCU-CHEK AVIVA) SOLN   . blood glucose meter kit and supplies Check blood sugars once weekly.  . budesonide-formoterol (SYMBICORT) 160-4.5 MCG/ACT inhaler Inhale 2 puffs into the lungs 2 (two) times daily.  . cholecalciferol (VITAMIN D) 1000 units tablet Take 1,000 Units daily by mouth.  . famotidine (PEPCID) 20 MG tablet Take 1 tablet (20 mg total) by mouth daily.  . Ferrous Fumarate-Folic Acid (HEMOCYTE-F) 324-1 MG TABS Take 1 tablet by mouth daily.  . fluticasone (FLONASE) 50  MCG/ACT nasal spray Place 1 spray into both nostrils daily. (Patient taking differently: Place 1 spray into both nostrils daily as needed for allergies. )  . furosemide (LASIX) 40 MG tablet TAKE 1 TABLET BY MOUTH DAILY AND A 2ND TABLET DAILY AS NEEDED FOR INCREASED EDEMA OR WEIGHT GAIN >3 LBS/24 HOUR  . loratadine (CLARITIN) 10 MG tablet Take 1 tablet (10 mg total) by mouth at bedtime. (Patient taking differently: Take 10 mg by mouth at bedtime as needed for allergies. )  . metoprolol succinate (TOPROL-XL) 50 MG 24 hr tablet TAKE 1 AND 1/2 TABLETS EVERY DAY  .  montelukast (SINGULAIR) 10 MG tablet Take 1 tablet (10 mg total) by mouth at bedtime.  . Olopatadine HCl 0.2 % SOLN   . OXYGEN Inhale 2 L into the lungs continuous.   . pantoprazole (PROTONIX) 40 MG tablet Take 40 mg by mouth 2 (two) times daily.  . potassium chloride SA (K-DUR) 20 MEQ tablet TAKE 1 TABLET DAILY, AND TAKE A SECOND TABLET DAILY AS NEEDED FOR EXTRA FUROSEMIDE TABLET AS DIRECTED.  Marland Kitchen pravastatin (PRAVACHOL) 20 MG tablet Take 1 tablet (20 mg total) by mouth every evening.  . Probiotic Product (PROBIOTIC PO) Take 1 tablet by mouth daily.   . sertraline (ZOLOFT) 100 MG tablet TAKE 1 TABLET EVERY DAY  . traMADol (ULTRAM) 50 MG tablet TAKE 1 TABLET BY MOUTH THREE TIMES DAILY AS NEEDED FOR MODERATE PAIN  . trolamine salicylate (ASPERCREME) 10 % cream Apply 1 application topically as needed for muscle pain.  . vitamin B-12 (CYANOCOBALAMIN) 1000 MCG tablet Take 1,000 mcg by mouth daily.                           Objective:     Amb with rollator wf nad   11/11/2018      188   03/22/18 190 lb (86.2 kg)  02/22/18 197 lb (89.4 kg)  02/18/18 189 lb 1.9 oz (85.8 kg)    Vital signs reviewed - Note on arrival 02 sats  97% on 1lpm POC         HEENT: Top dentures   HEENT : pt wearing mask not removed for exam due to covid -19 concerns.    NECK :  without JVD/Nodes/TM/ nl carotid upstrokes bilaterally   LUNGS: no acc  muscle use,  Nl contour chest which is clear to A and P bilaterally without cough on insp or exp maneuvers   CV:  RRR  no s3 or murmur or increase in P2, and 1+ bilateral LE edema   ABD:  Obese/ soft and nontender with nl inspiratory excursion in the supine position. No bruits or organomegaly appreciated, bowel sounds nl  MS:  Nl gait/ ext warm without deformities, calf tenderness, cyanosis or clubbing No obvious joint restrictions   SKIN: warm and dry with chronic venous stasis changes both LE's  NEURO:  alert, approp, nl sensorium with  no motor or cerebellar deficits apparent.     Assessment

## 2018-11-12 ENCOUNTER — Encounter: Payer: Self-pay | Admitting: Internal Medicine

## 2018-11-12 NOTE — Assessment & Plan Note (Signed)
Never smoker - Allergy profile 03/11/13  IgE 11 RAST  - Alpha one AT Screen 03/11/13  Level 167  PFT's  04/26/13   FEV1 1.47 (67 % ) ratio 0.77  p no % improvement from saba p nothing prior to study with DLCO  64 % corrects to 77 % for alv volume with mild curvature   - PFT's  03/22/2018  FEV1 1.11 (54 % ) ratio 0.70  p no % improvement from saba p neb saba 3 h  prior to study with DLCO  53 % corrects to 90 % for alv volume and ERV 16%    With mild curvature  - 02/22/2018    continue symbicort 160 2bid   - 11/11/2018  After extensive coaching inhaler device,  effectiveness =    90% > continue symb 160 2bid   All goals of chronic asthma control met including optimal function and elimination of symptoms with minimal need for rescue therapy> no change rx   Contingencies discussed in full including contacting this office immediately if not controlling the symptoms using the rule of two's.

## 2018-11-12 NOTE — Assessment & Plan Note (Addendum)
HCO3   02/18/2018   = 31  02/22/2018  Room Air at Rest = 88%  >>> while Ambulating = 87% and  on 3 Liters of oxygen while Ambulating = 94% - 03/22/2018   Walked 2lpm   1 laps @  approx 244ft each @ slow pace  stopped due to  Sob/desats resolved on 3lpm  And finished second lap s desats but stopped nonetheless with sob 11/11/2018  02 qualify Patient Saturations on Room Air at Rest = 100% andwhile Ambulating = 87% Patient Saturations on 2 Liters of oxygen while Ambulating = 94%   Pt has 02, uses it appropriately and is benefiting from both the concentrator and portable systems.  No need for 02 at rest sitting as long as sats > 90%   >>>   F/u 6 m, sooner prn   I had an extended discussion with the patient reviewing all relevant studies completed to date and  lasting 15 to 20 minutes of a 25 minute visit  which included directly observing ambulatory 02 saturation study documented in a/p section of  today's  office note.  Each maintenance medication was reviewed in detail including most importantly the difference between maintenance and prns and under what circumstances the prns are to be triggered using an action plan format that is not reflected in the computer generated alphabetically organized AVS.     Please see AVS for specific instructions unique to this visit that I personally wrote and verbalized to the the pt in detail and then reviewed with pt  by my nurse highlighting any changes in therapy recommended at today's visit .

## 2018-11-16 ENCOUNTER — Ambulatory Visit (HOSPITAL_COMMUNITY)
Admission: RE | Admit: 2018-11-16 | Discharge: 2018-11-16 | Disposition: A | Discharge status: Home / Self Care | Payer: Medicare HMO | Source: Ambulatory Visit | Attending: Interventional Radiology | Admitting: Interventional Radiology

## 2018-11-16 ENCOUNTER — Other Ambulatory Visit: Payer: Self-pay

## 2018-11-16 ENCOUNTER — Telehealth (HOSPITAL_COMMUNITY): Payer: Self-pay

## 2018-11-16 DIAGNOSIS — I609 Nontraumatic subarachnoid hemorrhage, unspecified: Secondary | ICD-10-CM

## 2018-11-16 NOTE — Progress Notes (Signed)
Supervising Physician: Luanne Bras  Patient Status:  [facility]  Chief Complaint:  Subarachnoid hemorrhage  HPI: [name] is a 78 y.o. female with a past medical history of hypertension, hyperlipidemia, HF, DVT, SAH 10/2017, COPD, asthma, pneumonia, GERD, CKD stage III, multiple myeloma, sickle cell anemia, hypercalcemia, vitamin D deficiency, macular degeneration, polymyalgia rheumatica, osteopenia, gout, arthritis, and depression.   She is known to [facility] and has been followed by Dr. Estanislado Pandy since 10/2017.   Patient was admitted to [facility] 11/04/2017 to 11/07/2017 for management of [place].   She first presented to our department for an incidental finding of an ACOM aneurysm.  She was seen by Dr. Estanislado Pandy 11/17/2017 to discuss management options for her intracranial aneurysm.   At that time, it was determined that patient recover 4 additional weeks, undergo CT head, and re-consult to develop a management plan at that time.  CT head 12/28/2017: 1. Normal noncontrast CT HEAD for age. Resolution of subdural hematomas.  She is back again today for follow up.  She had an MRA on 11/04/2018 which showed = 1. 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 2. No new intracranial aneurysm identified. 3. No intracranial large vessel occlusion or proximal high-grade arterial stenosis.  She denies any new headaches. She denies any new neurological symptoms.  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 Melvyn Novas for that.  She denies every being positive for COVID-19.  Allergies: Montelukast sodium, Sulfa antibiotics, Sulfonamide derivatives, Oseltamivir phosphate, Ace inhibitors, and Indomethacin  Medications: Prior to Admission medications   Medication Sig Start Date End Date Taking? Authorizing Provider  ACCU-CHEK AVIVA PLUS  test strip  07/01/18   [provider]  Accu-Chek Softclix Lancets lancets  07/01/18   [provider]  acetaminophen (TYLENOL) 500 MG tablet Take 500 mg by mouth 2 (two) times daily.     [provider]  albuterol (ACCUNEB) 0.63 MG/3ML nebulizer solution Take 3 mLs (0.63 mg total) by nebulization every 6 (six) hours as needed for wheezing or shortness of breath. 01/16/17   Mosie Lukes, MD  albuterol (VENTOLIN HFA) 108 (90 Base) MCG/ACT inhaler Inhale 2 puffs into the lungs 4 (four) times daily as needed for wheezing or shortness of breath. 06/30/18   Mosie Lukes, MD  Alcohol Swabs (ALCOHOL WIPES) 70 % PADS To use before checking blood sugars 06/30/18   Mosie Lukes, MD  allopurinol (ZYLOPRIM) 100 MG tablet TAKE 2 TABLETS ONE TIME DAILY 07/15/18   Mosie Lukes, MD  b complex vitamins tablet Take 1 tablet by mouth daily.    [provider]  benzonatate (TESSALON) 100 MG capsule Take 1 capsule (100 mg total) by mouth 3 (three) times daily as needed for cough. 01/25/18   Mosie Lukes, MD  Blood Glucose Calibration (ACCU-CHEK AVIVA) SOLN  07/01/18   [provider]  blood glucose meter kit and supplies Check blood sugars once weekly. 06/30/18   Mosie Lukes, MD  budesonide-formoterol [facility]) 160-4.5 MCG/ACT inhaler Inhale 2 puffs into the lungs 2 (two) times daily. 06/30/18   Mosie Lukes, MD  cholecalciferol (VITAMIN D) 1000 units tablet Take 1,000 Units daily by mouth.    [provider]  famotidine (PEPCID) 20 MG tablet Take 1 tablet (20 mg total) by mouth daily. 11/16/17   Mosie Lukes, MD  Ferrous Fumarate-Folic Acid (  HEMOCYTE-F) 324-1 MG TABS Take 1 tablet by mouth daily. 07/24/16   Mosie Lukes, MD  fluticasone (FLONASE) 50 MCG/ACT nasal spray Place 1 spray into both nostrils daily. Patient taking differently: Place 1 spray into both nostrils daily as needed for allergies.  03/06/16   Magdalen Spatz, NP  furosemide (LASIX)  40 MG tablet TAKE 1 TABLET BY MOUTH DAILY AND A 2ND TABLET DAILY AS NEEDED FOR INCREASED EDEMA OR WEIGHT GAIN >3 LBS/24 HOUR 10/08/17   Mosie Lukes, MD  loratadine (CLARITIN) 10 MG tablet Take 1 tablet (10 mg total) by mouth at bedtime. Patient taking differently: Take 10 mg by mouth at bedtime as needed for allergies.  03/06/16   Magdalen Spatz, NP  metoprolol succinate (TOPROL-XL) 50 MG 24 hr tablet TAKE 1 AND 1/2 TABLETS EVERY DAY 05/28/18   Mosie Lukes, MD  montelukast (SINGULAIR) 10 MG tablet Take 1 tablet (10 mg total) by mouth at bedtime. 08/31/18   Tanda Rockers, MD  Olopatadine HCl 0.2 % SOLN  07/28/18   [provider]  OXYGEN Inhale 2 L into the lungs continuous.     [provider]  pantoprazole (PROTONIX) 40 MG tablet Take 40 mg by mouth 2 (two) times daily. 05/18/18   [provider]  potassium chloride SA (K-DUR) 20 MEQ tablet TAKE 1 TABLET DAILY, AND TAKE A SECOND TABLET DAILY AS NEEDED FOR EXTRA FUROSEMIDE TABLET AS DIRECTED. 08/30/18   Mosie Lukes, MD  pravastatin (PRAVACHOL) 20 MG tablet Take 1 tablet (20 mg total) by mouth every evening. 12/21/17 12/21/18  Sueanne Margarita, MD  Probiotic Product (PROBIOTIC PO) Take 1 tablet by mouth daily.     [provider]  sertraline (ZOLOFT) 100 MG tablet TAKE 1 TABLET EVERY DAY 08/30/18   Mosie Lukes, MD  traMADol (ULTRAM) 50 MG tablet TAKE 1 TABLET BY MOUTH THREE TIMES DAILY AS NEEDED FOR MODERATE PAIN 10/11/18   Mosie Lukes, MD  trolamine salicylate (ASPERCREME) 10 % cream Apply 1 application topically as needed for muscle pain.    [provider]  vitamin B-12 (CYANOCOBALAMIN) 1000 MCG tablet Take 1,000 mcg by mouth daily.     [provider]    Imaging:  CLINICAL DATA:  Subarachnoid hemorrhage.  EXAM: MRA HEAD WITHOUT CONTRAST  TECHNIQUE: Angiographic images of the Circle of Willis were obtained using MRA technique without intravenous contrast.  COMPARISON:   Head CT 12/28/2017, MRI/MRA head 11/05/2017  FINDINGS: The intracranial internal carotid arteries are patent without significant stenosis. The right middle and anterior cerebral arteries are patent without significant proximal stenosis. The left middle and anterior cerebral arteries are patent without significant proximal stenosis  Unchanged small aneurysm versus ectasia of the anterior communicating artery, again measuring 2-3 mm (series 3, image 84).  The codominant intracranial vertebral arteries are patent without significant stenosis. The basilar artery is patent without significant stenosis. The posterior cerebral arteries are patent without significant proximal stenosis. Posterior communicating arteries are present bilaterally.  IMPRESSION: 1. 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 2. No new intracranial aneurysm identified. 3. No intracranial large vessel occlusion or proximal high-grade arterial stenosis.   Electronically Signed   By: Kellie Simmering   On: 11/04/2018 22:47  Physical Exam Constitutional:      Appearance: Normal appearance.  HENT:     Head: Normocephalic and atraumatic.  Eyes:  Extraocular Movements: Extraocular movements intact.  Neck:     Musculoskeletal: Normal range of motion.  Cardiovascular:     Rate and Rhythm: Normal rate and regular rhythm.  Pulmonary:     Effort: Pulmonary effort is normal. No respiratory distress.     Breath sounds: Normal breath sounds.     Comments: On portable O2 Musculoskeletal: Normal range of motion.  Skin:    General: Skin is warm and dry.  Neurological:     General: No focal deficit present.     Mental Status: She is alert and oriented to person, place, and time.  Psychiatric:        Mood and Affect: Mood normal.        Behavior: Behavior normal.        Thought Content: Thought content normal.         Judgment: Judgment normal.     Imaging: No results found.  Labs:  CBC: Recent Labs    04/19/18 1320 06/17/18 1413 08/23/18 1414 10/22/18 1315  WBC 6.3 6.7 5.9 5.4  HGB 10.3* 11.4* 10.2* 10.5*  HCT 32.9* 36.0 32.8* 33.5*  PLT 96* 137* 119* 120*    COAGS: No results for input(s): INR, APTT in the last 8760 hours.  BMP: Recent Labs    04/19/18 1320 06/17/18 1413 08/23/18 1414 10/22/18 1315  NA 141 140 140 139  K 4.7 4.5 5.2* 4.6  CL 102 99 103 99  CO2 30 33* 32 29  GLUCOSE 91 139* 88 94  BUN 51* 62* 51* 52*  CALCIUM 8.9 9.6 9.4 9.4  CREATININE 2.05* 2.08* 1.73* 2.04*  GFRNONAA 23* 22* 28* 23*  GFRAA 26* 26* 32* 27*    LIVER FUNCTION TESTS: Recent Labs    04/19/18 1320 06/17/18 1413 08/23/18 1414 10/22/18 1315  BILITOT 0.5 0.6 0.5 0.7  AST 13* 14* 13* 15  ALT '11 10 10 12  '$ ALKPHOS 47 46 42 50  PROT 6.7 6.9 6.3* 7.0  ALBUMIN 4.0 4.3 4.0 4.1    Assessment and Plan:  Subarachnoid hemorrhage.  MRA on 11/04/2018 which showed = 1. 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 2. No new intracranial aneurysm identified. 3. No intracranial large vessel occlusion or proximal high-grade arterial stenosis.  Patient was seen by Dr. Estanislado Pandy today.  Recommend repeat MRA in 1 year.  Electronically Signed: Murrell Redden, PA-C 11/16/2018, 1:47 PM    I spent a total of 15 Minutes at the the patient's bedside AND on the patient's hospital floor or unit, greater than 50% of which was counseling/coordinating care for f/u [facility].

## 2018-11-16 NOTE — Telephone Encounter (Signed)
Pt agreed to f/u in 1 year with mra. AW  °

## 2018-11-24 ENCOUNTER — Other Ambulatory Visit: Payer: Self-pay | Admitting: Family Medicine

## 2018-11-24 NOTE — Telephone Encounter (Signed)
Last written: 10/11/18 Last ov: 01/25/18 Next ov: nothing scheduled Contract: due UDS:; due

## 2018-12-13 ENCOUNTER — Other Ambulatory Visit: Payer: Self-pay | Admitting: Family

## 2018-12-15 ENCOUNTER — Telehealth: Payer: Self-pay | Admitting: Internal Medicine

## 2018-12-15 DIAGNOSIS — R0902 Hypoxemia: Secondary | ICD-10-CM

## 2018-12-15 NOTE — Telephone Encounter (Signed)
Left message for patient to call back  

## 2018-12-20 ENCOUNTER — Telehealth: Payer: Self-pay | Admitting: Internal Medicine

## 2018-12-20 NOTE — Telephone Encounter (Signed)
LMTCB.  It does not appear the oxygen order was sent. Order placed.

## 2018-12-20 NOTE — Telephone Encounter (Signed)
Left message for patient to call back  

## 2018-12-20 NOTE — Telephone Encounter (Signed)
Pt returning missed call. 

## 2018-12-21 NOTE — Telephone Encounter (Signed)
LMTCB

## 2018-12-21 NOTE — Telephone Encounter (Signed)
Error per [employer]

## 2018-12-22 ENCOUNTER — Inpatient Hospital Stay: Payer: Medicare HMO

## 2018-12-22 ENCOUNTER — Inpatient Hospital Stay: Payer: Medicare HMO | Attending: Family | Admitting: Family

## 2018-12-22 ENCOUNTER — Other Ambulatory Visit: Payer: Self-pay

## 2018-12-22 VITALS — BP 119/36 | HR 86 | Temp 97.1°F | Resp 19 | Ht 63.0 in | Wt 192.8 lb

## 2018-12-22 DIAGNOSIS — D631 Anemia in chronic kidney disease: Secondary | ICD-10-CM | POA: Insufficient documentation

## 2018-12-22 DIAGNOSIS — N183 Chronic kidney disease, stage 3 unspecified: Secondary | ICD-10-CM

## 2018-12-22 DIAGNOSIS — Z9981 Dependence on supplemental oxygen: Secondary | ICD-10-CM | POA: Diagnosis not present

## 2018-12-22 DIAGNOSIS — D508 Other iron deficiency anemias: Secondary | ICD-10-CM | POA: Diagnosis not present

## 2018-12-22 DIAGNOSIS — E611 Iron deficiency: Secondary | ICD-10-CM

## 2018-12-22 DIAGNOSIS — Z79899 Other long term (current) drug therapy: Secondary | ICD-10-CM | POA: Diagnosis not present

## 2018-12-22 DIAGNOSIS — D509 Iron deficiency anemia, unspecified: Secondary | ICD-10-CM | POA: Insufficient documentation

## 2018-12-22 LAB — CMP (CANCER CENTER ONLY)
ALT: 11 U/L (ref 0–44)
AST: 13 U/L — ABNORMAL LOW (ref 15–41)
Albumin: 4.7 g/dL (ref 3.5–5.0)
Alkaline Phosphatase: 51 U/L (ref 38–126)
Anion gap: 9 (ref 5–15)
BUN: 41 mg/dL — ABNORMAL HIGH (ref 8–23)
CO2: 32 mmol/L (ref 22–32)
Calcium: 10.2 mg/dL (ref 8.9–10.3)
Chloride: 99 mmol/L (ref 98–111)
Creatinine: 1.74 mg/dL — ABNORMAL HIGH (ref 0.44–1.00)
GFR, Est AFR Am: 32 mL/min — ABNORMAL LOW (ref >60)
GFR, Estimated: 28 mL/min — ABNORMAL LOW (ref >60)
Glucose, Bld: 84 mg/dL (ref 70–99)
Potassium: 4.3 mmol/L (ref 3.5–5.1)
Sodium: 140 mmol/L (ref 135–145)
Total Bilirubin: 0.5 mg/dL (ref 0.3–1.2)
Total Protein: 7.4 g/dL (ref 6.5–8.1)

## 2018-12-22 LAB — RETICULOCYTES
Immature Retic Fract: 8.7 % (ref 2.3–15.9)
RBC.: 3.58 MIL/uL — ABNORMAL LOW (ref 3.87–5.11)
Retic Count, Absolute: 39.7 10*3/uL (ref 19.0–186.0)
Retic Ct Pct: 1.1 % (ref 0.4–3.1)

## 2018-12-22 LAB — CBC WITH DIFFERENTIAL (CANCER CENTER ONLY)
Abs Immature Granulocytes: 0.06 10*3/uL (ref 0.00–0.07)
Basophils Absolute: 0 10*3/uL (ref 0.0–0.1)
Basophils Relative: 1 %
Eosinophils Absolute: 0.2 10*3/uL (ref 0.0–0.5)
Eosinophils Relative: 4 %
HCT: 35.5 % — ABNORMAL LOW (ref 36.0–46.0)
Hemoglobin: 11.1 g/dL — ABNORMAL LOW (ref 12.0–15.0)
Immature Granulocytes: 1 %
Lymphocytes Relative: 17 %
Lymphs Abs: 1.1 10*3/uL (ref 0.7–4.0)
MCH: 30.9 pg (ref 26.0–34.0)
MCHC: 31.3 g/dL (ref 30.0–36.0)
MCV: 98.9 fL (ref 80.0–100.0)
Monocytes Absolute: 0.7 10*3/uL (ref 0.1–1.0)
Monocytes Relative: 11 %
Neutro Abs: 4.3 10*3/uL (ref 1.7–7.7)
Neutrophils Relative %: 66 %
Platelet Count: 119 10*3/uL — ABNORMAL LOW (ref 150–400)
RBC: 3.59 MIL/uL — ABNORMAL LOW (ref 3.87–5.11)
RDW: 14 % (ref 11.5–15.5)
WBC Count: 6.4 10*3/uL (ref 4.0–10.5)
nRBC: 0 % (ref 0.0–0.2)

## 2018-12-22 NOTE — Progress Notes (Signed)
Hematology and Oncology Follow Up Visit  [name] [id number] [birth year] 78 y.o. 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 is here today for follow-up. She is doing well and has no complaints at this time.  She states that her SOB has remained stable. She is on 2L [place] supplemental O2 24 hours a day.  No fever, chills, n/v, cough, rash, dizziness, chest pain, palpitations, abdominal pain or changes in bowel or bladder habits.  No tenderness, numbness or tingling in her extremities.  She has swelling that comes and goes in her feet and ankles. She takes lasix daily which helps reduce any fluid retention.  No fall or syncopal episodes.  No bleeding, bruising or petechiae.  She is eating well and staying hydrated. Her weight is stable.   ECOG Performance Status: 1 - Symptomatic but completely ambulatory  Medications:  Allergies as of 12/22/2018      Reactions   Montelukast Sodium Other (See Comments)   REACTION: HEART PALPITATIONS, CHEST PAIN.   Sulfa Antibiotics Other (See Comments)   CHEST PAIN   Sulfonamide Derivatives Other (See Comments)   CHEST PAIN   Oseltamivir Phosphate Diarrhea, Nausea And Vomiting, Other (See Comments)   TAMIFLU REACTION:  dizziness   Ace Inhibitors Other (See Comments)   PT. STATED UNKNOWN REACTION   Indomethacin Other (See Comments)   PT. STATED UNKNOWN REACTION      Medication List       Accurate as of December 22, 2018  1:56 PM. If you have any questions, ask your nurse or doctor.        Accu-Chek Aviva Plus test strip Generic drug: glucose blood   Accu-Chek Aviva Soln   Accu-Chek Softclix Lancets lancets   acetaminophen 500 MG tablet Commonly known as: TYLENOL Take 500 mg by mouth 2 (two) times daily.   albuterol 0.63 MG/3ML nebulizer solution Commonly known as: ACCUNEB Take 3 mLs (0.63  mg total) by nebulization every 6 (six) hours as needed for wheezing or shortness of breath.   albuterol 108 (90 Base) MCG/ACT inhaler Commonly known as: VENTOLIN HFA Inhale 2 puffs into the lungs 4 (four) times daily as needed for wheezing or shortness of breath.   Alcohol Wipes 70 % Pads To use before checking blood sugars   allopurinol 100 MG tablet Commonly known as: ZYLOPRIM TAKE 2 TABLETS ONE TIME DAILY   b complex vitamins tablet Take 1 tablet by mouth daily.   benzonatate 100 MG capsule Commonly known as: TESSALON Take 1 capsule (100 mg total) by mouth 3 (three) times daily as needed for cough.   blood glucose meter kit and supplies Check blood sugars once weekly.   budesonide-formoterol 160-4.5 MCG/ACT inhaler Commonly known as: Symbicort Inhale 2 puffs into the lungs 2 (two) times daily.   cholecalciferol 1000 units tablet Commonly known as: VITAMIN D Take 1,000 Units daily by mouth.   famotidine 20 MG tablet Commonly known as: Pepcid Take 1 tablet (20 mg total) by mouth daily.   Ferrous Fumarate-Folic Acid 494-4 MG Tabs Commonly known as: Hemocyte-F Take 1 tablet by mouth daily.   fluticasone 50 MCG/ACT nasal spray Commonly known as: FLONASE Place 1 spray into both nostrils daily. What changed:   when to take this  reasons to take this   furosemide 40 MG tablet Commonly known as: LASIX  TAKE 1 TABLET BY MOUTH DAILY AND A 2ND TABLET DAILY AS NEEDED FOR INCREASED EDEMA OR WEIGHT GAIN >3 LBS/24 HOUR   loratadine 10 MG tablet Commonly known as: CLARITIN Take 1 tablet (10 mg total) by mouth at bedtime. What changed:   when to take this  reasons to take this   metoprolol succinate 50 MG 24 hr tablet Commonly known as: TOPROL-XL TAKE 1 AND 1/2 TABLETS EVERY DAY   montelukast 10 MG tablet Commonly known as: SINGULAIR Take 1 tablet (10 mg total) by mouth at bedtime.   Olopatadine HCl 0.2 % Soln   OXYGEN Inhale 2 L into the lungs continuous.     pantoprazole 40 MG tablet Commonly known as: PROTONIX Take 40 mg by mouth 2 (two) times daily.   potassium chloride SA 20 MEQ tablet Commonly known as: KLOR-CON TAKE 1 TABLET DAILY, AND TAKE A SECOND TABLET DAILY AS NEEDED FOR EXTRA FUROSEMIDE TABLET AS DIRECTED.   pravastatin 20 MG tablet Commonly known as: PRAVACHOL Take 1 tablet (20 mg total) by mouth every evening.   PROBIOTIC PO Take 1 tablet by mouth daily.   sertraline 100 MG tablet Commonly known as: ZOLOFT TAKE 1 TABLET EVERY DAY   traMADol 50 MG tablet Commonly known as: ULTRAM TAKE 1 TABLET BY MOUTH THREE TIMES DAILY AS NEEDED FOR MODERATE PAIN   trolamine salicylate 10 % cream Commonly known as: ASPERCREME Apply 1 application topically as needed for muscle pain.   vitamin B-12 1000 MCG tablet Commonly known as: CYANOCOBALAMIN Take 1,000 mcg by mouth daily.       Allergies:  Allergies  Allergen Reactions   Montelukast Sodium Other (See Comments)    REACTION: HEART PALPITATIONS, CHEST PAIN.   Sulfa Antibiotics Other (See Comments)    CHEST PAIN   Sulfonamide Derivatives Other (See Comments)    CHEST PAIN   Oseltamivir Phosphate Diarrhea, Nausea And Vomiting and Other (See Comments)    TAMIFLU REACTION:  dizziness   Ace Inhibitors Other (See Comments)    PT. STATED UNKNOWN REACTION   Indomethacin Other (See Comments)    PT. STATED UNKNOWN REACTION    Past Medical History, Surgical history, Social history, and Family History were reviewed and updated.  Review of Systems: All other 10 point review of systems is negative.   Physical Exam:  vitals were not taken for this visit.   Wt Readings from Last 3 Encounters:  11/11/18 188 lb 9.6 oz (85.5 kg)  10/22/18 193 lb (87.5 kg)  08/23/18 191 lb 12.8 oz (87 kg)    Ocular: Sclerae unicteric, pupils equal, round and reactive to light Ear-nose-throat: Oropharynx clear, dentition fair Lymphatic: No cervical or supraclavicular adenopathy Lungs  no rales or rhonchi, good excursion bilaterally Heart regular rate and rhythm, no murmur appreciated Abd soft, nontender, positive bowel sounds, no liver or spleen tip palpated on exam, no fluid wave  MSK no focal spinal tenderness, no joint edema Neuro: non-focal, well-oriented, appropriate affect Breasts: Deferred   Lab Results  Component Value Date   WBC 5.4 10/22/2018   HGB 10.5 (L) 10/22/2018   HCT 33.5 (L) 10/22/2018   MCV 101.8 (H) 10/22/2018   PLT 120 (L) 10/22/2018   Lab Results  Component Value Date   FERRITIN 414 (H) 10/22/2018   IRON 104 10/22/2018   TIBC 251 10/22/2018   UIBC 147 10/22/2018   IRONPCTSAT 41 10/22/2018   Lab Results  Component Value Date   RETICCTPCT 1.1 12/22/2018   RBC 3.58 (L)  12/22/2018   No results found for: KPAFRELGTCHN, LAMBDASER, KAPLAMBRATIO No results found for: IGGSERUM, IGA, IGMSERUM No results found for: Odetta Pink, SPEI   Chemistry      Component Value Date/Time   NA 139 10/22/2018 1315   NA 143 07/21/2017 1501   K 4.6 10/22/2018 1315   CL 99 10/22/2018 1315   CO2 29 10/22/2018 1315   BUN 52 (H) 10/22/2018 1315   BUN 41 (H) 07/21/2017 1501   CREATININE 2.04 (H) 10/22/2018 1315   CREATININE 1.36 (H) 08/23/2013 1134      Component Value Date/Time   CALCIUM 9.4 10/22/2018 1315   ALKPHOS 50 10/22/2018 1315   AST 15 10/22/2018 1315   ALT 12 10/22/2018 1315   BILITOT 0.7 10/22/2018 1315       Impression and Plan: Ms. Glastetter is a very pleasant 78 yo caucasian female with multifactorial anemia. No Aranesp needed this visit for Hgb 11.1.  We will see what her iron studies show and bring her back in for infusion if needed.  We will plan to see her back in another 8 weeks.  She will contact our office with any questions or concerns. We can certainly see her sooner if needed.   Laverna Peace, NP 12/2/20201:56 PM

## 2018-12-23 ENCOUNTER — Telehealth: Payer: Self-pay | Admitting: Hematology & Oncology

## 2018-12-23 LAB — IRON AND TIBC
Iron: 78 ug/dL (ref 41–142)
Saturation Ratios: 29 % (ref 21–57)
TIBC: 265 ug/dL (ref 236–444)
UIBC: 187 ug/dL (ref 120–384)

## 2018-12-23 LAB — FERRITIN: Ferritin: 420 ng/mL — ABNORMAL HIGH (ref 11–307)

## 2018-12-23 NOTE — Telephone Encounter (Signed)
Appointments scheduled letter/calendar mailed per 12/2 los

## 2018-12-23 NOTE — Telephone Encounter (Signed)
Called and spoke to pt. Pt questioning if we placed her O2 order. Informed this order was placed on 11/30. Pt verbalized understanding and states she will call them tomorrow and will call us back if there are any issues. Pt states she has portable O2 at this time. Will sign off.

## 2019-01-02 ENCOUNTER — Other Ambulatory Visit: Payer: Self-pay | Admitting: Family Medicine

## 2019-01-03 NOTE — Telephone Encounter (Signed)
Requesting:tramadol Contract:yes UDS:n/a Last OV:04/05/18 Next OV:02/15/19 Last Refill:11/24/18  #[id number] Database:   Please advise

## 2019-01-04 DIAGNOSIS — N184 Chronic kidney disease, stage 4 (severe): Secondary | ICD-10-CM | POA: Diagnosis not present

## 2019-01-04 DIAGNOSIS — N390 Urinary tract infection, site not specified: Secondary | ICD-10-CM | POA: Diagnosis not present

## 2019-01-04 DIAGNOSIS — D631 Anemia in chronic kidney disease: Secondary | ICD-10-CM | POA: Diagnosis not present

## 2019-01-04 DIAGNOSIS — E1122 Type 2 diabetes mellitus with diabetic chronic kidney disease: Secondary | ICD-10-CM | POA: Diagnosis not present

## 2019-01-04 DIAGNOSIS — I129 Hypertensive chronic kidney disease with stage 1 through stage 4 chronic kidney disease, or unspecified chronic kidney disease: Secondary | ICD-10-CM | POA: Diagnosis not present

## 2019-01-12 ENCOUNTER — Other Ambulatory Visit: Payer: Self-pay | Admitting: Family Medicine

## 2019-01-13 NOTE — Telephone Encounter (Signed)
Requesting:tramadol Contract:n/a UDS:n/a Last OV:04/05/18 Next OV:02/15/19 Last Refill:01/03/19  #[id number] Database:   Please advise

## 2019-01-30 NOTE — Progress Notes (Signed)
Cardiology Office Note:    Date:  01/31/2019   ID:  [name], DOB 1940/02/17, MRN [id number]  PCP:  Mosie Lukes, MD  Cardiologist:  Fransico Him, MD   Referring MD: Mosie Lukes, MD   Chief Complaint  Patient presents with  . Congestive Heart Failure  . Hypertension  . Hyperlipidemia    History of Present Illness:    [name] is a 79 y.o. female with a hx of asthma, atypical CP with normal coronary arteries by cath in 2008 in the setting of NSTEMI complicated by right groin pseudoaneurysm, chronic diastolic CHF, GERD, HTN and hyperlipidemia. 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 Lastecho showed normal LVF with mild MR and grade I diastolic dysfunction.  She is here today for followup and is doing well.  She denies any chest pain or pressure, PND, orthopnea, LE edema, dizziness, palpitations or syncope. She has chronic SOB related to her COPD which is stable on 2L O2.  She is able to sleep on 1 pillow at night and adjusts the head of her bed up but can turn her O2 down to 1L at night without any PND or SOB.  She is compliant with her meds and is tolerating meds with no SE.    Past Medical History:  Diagnosis Date  . Abnormal glucose tolerance test   . Abnormal TSH 09/22/2016  . ACE-inhibitor cough   . Anemia 03/12/2014  . Arthritis   . Asthma    PFT 02/06/09 FEV1 1.41 (65%), FVC 1.92 (64), FEV1% 74, TLC 3.47 (71%), DLCO 48%, +BD  . Atypical chest pain    s/p cath, Normal coronaries, Non ST elevation myocardial infarction, Rt groin pseudoaneurysm  . Bacterial vaginosis 03/12/2014  . Cellulitis 06/22/2016  . Chronic kidney disease (CKD), stage III (moderate) 08/04/2016  . COPD (chronic obstructive pulmonary disease) ([place])   . Depression   . Diastolic heart failure ([place]) 04/07/2016  . DVT (deep venous thrombosis) ([place]) 1987  . GERD (gastroesophageal reflux disease)   . Gout   . Hypercalcemia 10/15/2014  . Hyperlipidemia   .  Hypertension   . Hypoxia 10/15/2014  . Macular degeneration 04/10/2015  . Osteopenia 12/29/2006   Qualifier: Diagnosis of  By: Wynona Luna   . Pneumonia   . 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 Renal insufficiency 09/22/2016  . Unspecified constipation 06/05/2013  . Vitamin D deficiency 01/01/2015    Past Surgical History:  Procedure Laterality Date  . APPENDECTOMY  1951  . TONSILLECTOMY  1950  . TUBAL LIGATION  1968    Current Medications: Current Meds  Medication Sig  . ACCU-CHEK AVIVA PLUS test strip   . Accu-Chek Softclix Lancets lancets   . acetaminophen (TYLENOL) 500 MG tablet Take 500 mg by mouth 2 (two) times daily.   Marland Kitchen albuterol (ACCUNEB) 0.63 MG/3ML nebulizer solution Take 3 mLs (0.63 mg total) by nebulization every 6 (six) hours as needed for wheezing or shortness of breath.  Marland Kitchen albuterol (VENTOLIN HFA) 108 (90 Base) MCG/ACT inhaler Inhale 2 puffs into the lungs 4 (four) times daily as needed for wheezing or shortness of breath.  . Alcohol Swabs (ALCOHOL WIPES) 70 % PADS To use before checking blood sugars  . allopurinol (ZYLOPRIM) 100 MG tablet TAKE 2 TABLETS ONE TIME DAILY  . b complex vitamins tablet Take 1 tablet by mouth daily.  . benzonatate (TESSALON) 100 MG capsule Take 1  capsule (100 mg total) by mouth 3 (three) times daily as needed for cough.  . Blood Glucose Calibration (ACCU-CHEK AVIVA) SOLN   . blood glucose meter kit and supplies Check blood sugars once weekly.  . budesonide-formoterol (SYMBICORT) 160-4.5 MCG/ACT inhaler Inhale 2 puffs into the lungs 2 (two) times daily.  . cholecalciferol (VITAMIN D) 1000 units tablet Take 1,000 Units daily by mouth.  . famotidine (PEPCID) 20 MG tablet Take 1 tablet (20 mg total) by mouth daily.  . Ferrous Fumarate-Folic Acid (HEMOCYTE-F) 324-1 MG TABS Take 1 tablet by mouth daily.  . fluticasone (FLONASE) 50 MCG/ACT nasal spray Place 1 spray into both nostrils daily. (Patient taking differently: Place 1 spray into  both nostrils daily as needed for allergies. )  . furosemide (LASIX) 40 MG tablet TAKE 1 TABLET BY MOUTH DAILY AND A 2ND TABLET DAILY AS NEEDED FOR INCREASED EDEMA OR WEIGHT GAIN >3 LBS/24 HOUR  . loratadine (CLARITIN) 10 MG tablet Take 1 tablet (10 mg total) by mouth at bedtime. (Patient taking differently: Take 10 mg by mouth at bedtime as needed for allergies. )  . metoprolol succinate (TOPROL-XL) 50 MG 24 hr tablet TAKE 1 AND 1/2 TABLETS EVERY DAY  . montelukast (SINGULAIR) 10 MG tablet Take 1 tablet (10 mg total) by mouth at bedtime.  . Olopatadine HCl 0.2 % SOLN   . OXYGEN Inhale 2 L into the lungs continuous.   . pantoprazole (PROTONIX) 40 MG tablet Take 40 mg by mouth 2 (two) times daily.  . potassium chloride SA (K-DUR) 20 MEQ tablet TAKE 1 TABLET DAILY, AND TAKE A SECOND TABLET DAILY AS NEEDED FOR EXTRA FUROSEMIDE TABLET AS DIRECTED.  . Probiotic Product (PROBIOTIC PO) Take 1 tablet by mouth daily.   . sertraline (ZOLOFT) 100 MG tablet TAKE 1 TABLET EVERY DAY  . traMADol (ULTRAM) 50 MG tablet TAKE 1 TABLET BY MOUTH THREE TIMES DAILY AS NEEDED FOR MODERATE PAIN  . trolamine salicylate (ASPERCREME) 10 % cream Apply 1 application topically as needed for muscle pain.  . vitamin B-12 (CYANOCOBALAMIN) 1000 MCG tablet Take 1,000 mcg by mouth daily.      Allergies:   Montelukast sodium, Sulfa antibiotics, Sulfonamide derivatives, Oseltamivir phosphate, Ace inhibitors, and Indomethacin   Social History   Socioeconomic History  . Marital status: Widowed    Spouse name: Not on file  . Number of children: 1  . Years of education: Not on file  . Highest education level: Not on file  Occupational History  . Occupation: Retired    Fish farm manager: [employer]  Tobacco Use  . Smoking status: Never Smoker  . Smokeless tobacco: Never Used  Substance and Sexual Activity  . Alcohol use: No  . Drug use: No  . Sexual activity: Never    Comment: lives alone, no dietary restrictions  Other Topics  Concern  . Not on file  Social History Narrative  . Not on file   Social Determinants of Health   Financial Resource Strain:   . Difficulty of Paying Living Expenses: Not on file  Food Insecurity:   . Worried About Charity fundraiser in the Last Year: Not on file  . Ran Out of Food in the Last Year: Not on file  Transportation Needs:   . Lack of Transportation (Medical): Not on file  . Lack of Transportation (Non-Medical): Not on file  Physical Activity:   . Days of Exercise per Week: Not on file  . Minutes of Exercise per Session: Not on file  Stress:   . Feeling of Stress : Not on file  Social Connections:   . Frequency of Communication with Friends and Family: Not on file  . Frequency of Social Gatherings with Friends and Family: Not on file  . Attends Religious Services: Not on file  . Active Member of Clubs or Organizations: Not on file  . Attends Archivist Meetings: Not on file  . Marital Status: Not on file     Family History: The patient's family history includes Alcohol abuse in her father; Arthritis in her brother, sister, sister, and sister; Asthma in her sister; COPD in her brother and sister; Colon polyps in her sister; Coronary artery disease in her brother and another family member; Emphysema in her brother and sister; Epilepsy in her daughter; Heart attack in her brother and mother; Heart disease in her brother, mother, and sister; Hyperlipidemia in her mother, sister, sister, and sister; Hypertension in her daughter, sister, sister, and sister; Lung cancer in her brother and brother; Mental illness in her father; Obesity in her daughter; Suicidality in her father; Uterine cancer in her sister.  ROS:   Please see the history of present illness.    ROS  All other systems reviewed and negative.   EKGs/Labs/Other Studies Reviewed:    The following studies were reviewed today: none  EKG:  EKG is  ordered today.  The ekg ordered today demonstrates NSR  at 72bpm.  Recent Labs: 12/22/2018: ALT 11; BUN 41; Creatinine 1.74; Hemoglobin 11.1; Platelet Count 119; Potassium 4.3; Sodium 140   Recent Lipid Panel    Component Value Date/Time   CHOL 127 02/01/2018 1256   TRIG 116 02/01/2018 1256   TRIG 122 01/27/2006 1037   HDL 40 02/01/2018 1256   CHOLHDL 3.2 02/01/2018 1256   CHOLHDL 4 11/20/2017 1216   VLDL 29.0 11/20/2017 1216   LDLCALC 64 02/01/2018 1256   LDLDIRECT 73.0 06/16/2017 1543    Physical Exam:    VS:  BP 134/72   Pulse 72   Ht '5\' 3"'$  (1.6 m)   Wt 191 lb 12.8 oz (87 kg)   SpO2 99%   BMI 33.98 kg/m     Wt Readings from Last 3 Encounters:  01/31/19 191 lb 12.8 oz (87 kg)  12/22/18 192 lb 12.8 oz (87.5 kg)  11/11/18 188 lb 9.6 oz (85.5 kg)     GEN:  Well nourished, well developed in no acute distress HEENT: Normal NECK: No JVD; No carotid bruits LYMPHATICS: No lymphadenopathy CARDIAC: RRR, no murmurs, rubs, gallops RESPIRATORY:  Clear to auscultation without rales, wheezing or rhonchi  ABDOMEN: Soft, non-tender, non-distended MUSCULOSKELETAL:  No edema; No deformity  SKIN: Warm and dry NEUROLOGIC:  Alert and oriented x 3 PSYCHIATRIC:  Normal affect   ASSESSMENT:    1. Chronic diastolic CHF (congestive heart failure) ([place])   2. Essential hypertension   3. Syncope and collapse   4. Peripheral arterial disease ([place])   5. Hyperlipidemia, mixed    PLAN:    In order of problems listed above:  1.  Chronic diastolic CHF -she appears euvolemic on exam -SOB is at baseline and no LE edema -usually her weight is stable but she gained 3lbs overnight but thinks she forgot her lasix yesterday.   -I instructed her to call if she gains more than 3lbs in 1 day or 5lbs in 1 week -Continue Lasix '40mg'$  daily  2.  HTN -BP controlled -continue Toprol XL '50mg'$  daily  3.  Syncope -denies  any dizziness or further syncope  4. PAD -Doppler 07/2016 showed greater than 50% bilateral external iliac artery stenosis, 50 to 74%  bilateral CFA stenosis, 50 to 74% right distal SFA stenosis with three-vessel runoff bilaterally. -See by Dr. Gwenlyn Found and felt that patient's LE pain was not due to arterial insufficiency  5.  HLD -LDL goal < 70 -continue pravastatin 20m daily -check FLP and ALT    Medication Adjustments/Labs and Tests Ordered: Current medicines are reviewed at length with the patient today.  Concerns regarding medicines are outlined above.  Orders Placed This Encounter  Procedures  . Lipid Profile  . ALT  . EKG 12/Charge capture   No orders of the defined types were placed in this encounter.   Signed, TFransico Him MD  01/31/2019 11:44 AM    [place]

## 2019-01-31 ENCOUNTER — Ambulatory Visit: Payer: Medicare HMO | Admitting: Cardiology

## 2019-01-31 ENCOUNTER — Encounter: Payer: Self-pay | Admitting: Cardiology

## 2019-01-31 ENCOUNTER — Other Ambulatory Visit: Payer: Self-pay

## 2019-01-31 VITALS — BP 134/72 | HR 72 | Ht 63.0 in | Wt 191.8 lb

## 2019-01-31 DIAGNOSIS — I739 Peripheral vascular disease, unspecified: Secondary | ICD-10-CM

## 2019-01-31 DIAGNOSIS — I5032 Chronic diastolic (congestive) heart failure: Secondary | ICD-10-CM

## 2019-01-31 DIAGNOSIS — E782 Mixed hyperlipidemia: Secondary | ICD-10-CM | POA: Diagnosis not present

## 2019-01-31 DIAGNOSIS — I10 Essential (primary) hypertension: Secondary | ICD-10-CM | POA: Diagnosis not present

## 2019-01-31 DIAGNOSIS — R55 Syncope and collapse: Secondary | ICD-10-CM

## 2019-01-31 DIAGNOSIS — J449 Chronic obstructive pulmonary disease, unspecified: Secondary | ICD-10-CM | POA: Diagnosis not present

## 2019-01-31 LAB — LIPID PANEL
Chol/HDL Ratio: 2.9 ratio (ref 0.0–4.4)
Cholesterol, Total: 149 mg/dL (ref 100–199)
HDL: 51 mg/dL (ref >39)
LDL Chol Calc (NIH): 78 mg/dL (ref 0–99)
Triglycerides: 111 mg/dL (ref 0–149)
VLDL Cholesterol Cal: 20 mg/dL (ref 5–40)

## 2019-01-31 LAB — ALT: ALT: 9 IU/L (ref 0–32)

## 2019-01-31 NOTE — Patient Instructions (Signed)
Medication Instructions:  Your physician recommends that you continue on your current medications as directed. Please refer to the Current Medication list given to you today.  *If you need a refill on your cardiac medications before your next appointment, please call your pharmacy*  Lab Work: FLP and ALT today.  If you have labs (blood work) drawn today and your tests are completely normal, you will receive your results only by: Marland Kitchen MyChart Message (if you have MyChart) OR . A paper copy in the mail If you have any lab test that is abnormal or we need to change your treatment, we will call you to review the results.  Follow-Up: At [facility], you and your health needs are our priority.  As part of our continuing mission to provide you with exceptional heart care, we have created designated Provider Care Teams.  These Care Teams include your primary Cardiologist (physician) and Advanced Practice Providers (APPs -  Physician Assistants and Nurse Practitioners) who all work together to provide you with the care you need, when you need it.  Your next appointment:   1 year(s)  The format for your next appointment:   In Person  Provider:   You may see Fransico Him, MD or one of the following Advanced Practice Providers on your designated Care Team:    Melina Copa, PA-C  Ermalinda Barrios, PA-C

## 2019-02-02 ENCOUNTER — Other Ambulatory Visit: Payer: Self-pay | Admitting: Cardiology

## 2019-02-02 ENCOUNTER — Other Ambulatory Visit: Payer: Self-pay | Admitting: Family Medicine

## 2019-02-02 ENCOUNTER — Other Ambulatory Visit: Payer: Self-pay | Admitting: Internal Medicine

## 2019-02-03 ENCOUNTER — Telehealth: Payer: Self-pay

## 2019-02-03 NOTE — Telephone Encounter (Signed)
-----   Message from Sueanne Margarita, MD sent at 01/31/2019  7:44 PM EST ----- LDL not at goal of < 70 - increase Pravastatin to 40mg  daily and repeat FLP and ALT in 6 weeks

## 2019-02-09 ENCOUNTER — Other Ambulatory Visit: Payer: Self-pay

## 2019-02-09 DIAGNOSIS — E782 Mixed hyperlipidemia: Secondary | ICD-10-CM

## 2019-02-09 MED ORDER — PRAVASTATIN SODIUM 40 MG PO TABS: 40.0000 mg | ORAL_TABLET | Freq: Every evening | ORAL | 3 refills | Status: DC

## 2019-02-10 ENCOUNTER — Other Ambulatory Visit: Payer: Self-pay

## 2019-02-10 ENCOUNTER — Ambulatory Visit (INDEPENDENT_AMBULATORY_CARE_PROVIDER_SITE_OTHER): Payer: Medicare HMO | Admitting: *Deleted

## 2019-02-10 ENCOUNTER — Encounter: Payer: Self-pay | Admitting: *Deleted

## 2019-02-10 DIAGNOSIS — Z0000 Encounter for general adult medical examination without abnormal findings: Secondary | ICD-10-CM | POA: Diagnosis not present

## 2019-02-10 NOTE — Progress Notes (Signed)
Virtual Visit via Audio Note  I connected with patient on 02/10/19 at  2:30 PM EST by audio enabled telemedicine application and verified that I am speaking with the correct person using two identifiers.   THIS ENCOUNTER IS A VIRTUAL VISIT DUE TO COVID-19 - PATIENT WAS NOT SEEN IN THE OFFICE. PATIENT HAS CONSENTED TO VIRTUAL VISIT / TELEMEDICINE VISIT   Location of patient: home  Location of provider: office  I discussed the limitations of evaluation and management by telemedicine and the availability of in person appointments. The patient expressed understanding and agreed to proceed.   Subjective:   [name] is a 79 y.o. female who presents for Medicare Annual (Subsequent) preventive examination.  Review of Systems:  Home Safety/Smoke Alarms: Feels safe in home. Smoke alarms in place.  Lives alone in 1 story home. Family lives close by. Pt wears O2 _0  via [place] at all times. Uses walker as needed.  Female:        Mammo-  Pt states she will schedule.     Dexa scan- 03/31/17        CCS- 11/09/13 with 5 yr recall.      Objective:     Vitals: Unable to assess. This visit is enabled though telemedicine due to Covid 19.   Advanced Directives 02/10/2019 12/22/2018 10/22/2018 06/17/2018 04/19/2018 02/18/2018 02/08/2018  Does Patient Have a Medical Advance Directive? No Yes Yes No No No No  Type of Advance Directive - [place];Living will [place];Living will - - - -  Does patient want to make changes to medical advance directive? - No - Patient declined - - - - -  Copy of [place] in Chart? - No - copy requested No - copy requested - - - -  Would patient like information on creating a medical advance directive? No - Patient declined No - Patient declined - - No - Patient declined No - Patient declined Yes (MAU/Ambulatory/Procedural Areas - Information given)  Pre-existing out of facility DNR order (yellow form or pink MOST  form) - - - - - - -    Tobacco Social History   Tobacco Use  Smoking Status Never Smoker  Smokeless Tobacco Never Used     Counseling given: Not Answered   Clinical Intake: Pain : No/denies pain     Past Medical History:  Diagnosis Date  . Abnormal glucose tolerance test   . Abnormal TSH 09/22/2016  . ACE-inhibitor cough   . Anemia 03/12/2014  . Arthritis   . Asthma    PFT 02/06/09 FEV1 1.41 (65%), FVC 1.92 (64), FEV1% 74, TLC 3.47 (71%), DLCO 48%, +BD  . Atypical chest pain    s/p cath, Normal coronaries, Non ST elevation myocardial infarction, Rt groin pseudoaneurysm  . Bacterial vaginosis 03/12/2014  . Cellulitis 06/22/2016  . Chronic kidney disease (CKD), stage III (moderate) 08/04/2016  . COPD (chronic obstructive pulmonary disease) ([place])   . Depression   . Diastolic heart failure ([place]) 04/07/2016  . DVT (deep venous thrombosis) ([place]) 1987  . GERD (gastroesophageal reflux disease)   . Gout   . Hypercalcemia 10/15/2014  . Hyperlipidemia   . Hypertension   . Hypoxia 10/15/2014  . Macular degeneration 04/10/2015  . Osteopenia 12/29/2006   Qualifier: Diagnosis of  By: Wynona Luna   . Pneumonia   . 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 Renal insufficiency 09/22/2016  . Unspecified constipation 06/05/2013  . Vitamin D deficiency 01/01/2015  Past Surgical History:  Procedure Laterality Date  . APPENDECTOMY  1951  . TONSILLECTOMY  1950  . TUBAL LIGATION  1968   Family History  Problem Relation Age of Onset  . Asthma Sister   . Arthritis Sister   . Hypertension Sister   . Hyperlipidemia Sister   . Uterine cancer Sister   . Coronary artery disease Brother        x2  . Arthritis Brother   . Lung cancer Brother        smoker  . Hypertension Sister   . Arthritis Sister   . Hyperlipidemia Sister   . Emphysema Sister   . COPD Sister        smoker  . Heart disease Sister   . Hyperlipidemia Sister   . Hypertension Sister   . Arthritis Sister   .  Emphysema Brother   . Heart disease Brother         smoker  . Heart attack Brother   . Mental illness Father   . Alcohol abuse Father   . Suicidality Father   . Heart disease Mother   . Hyperlipidemia Mother   . Heart attack Mother   . Epilepsy Daughter   . Hypertension Daughter   . Obesity Daughter   . COPD Brother        smoker  . Lung cancer Brother   . Coronary artery disease Other   . Colon polyps Sister    Social History   Socioeconomic History  . Marital status: Widowed    Spouse name: Not on file  . Number of children: 1  . Years of education: Not on file  . Highest education level: Not on file  Occupational History  . Occupation: Retired    Fish farm manager: [employer]  Tobacco Use  . Smoking status: Never Smoker  . Smokeless tobacco: Never Used  Substance and Sexual Activity  . Alcohol use: No  . Drug use: No  . Sexual activity: Never    Comment: lives alone, no dietary restrictions  Other Topics Concern  . Not on file  Social History Narrative  . Not on file   Social Determinants of Health   Financial Resource Strain:   . Difficulty of Paying Living Expenses: Not on file  Food Insecurity:   . Worried About Charity fundraiser in the Last Year: Not on file  . Ran Out of Food in the Last Year: Not on file  Transportation Needs:   . Lack of Transportation (Medical): Not on file  . Lack of Transportation (Non-Medical): Not on file  Physical Activity:   . Days of Exercise per Week: Not on file  . Minutes of Exercise per Session: Not on file  Stress:   . Feeling of Stress : Not on file  Social Connections:   . Frequency of Communication with Friends and Family: Not on file  . Frequency of Social Gatherings with Friends and Family: Not on file  . Attends Religious Services: Not on file  . Active Member of Clubs or Organizations: Not on file  . Attends Archivist Meetings: Not on file  . Marital Status: Not on file    Outpatient Encounter  Medications as of 02/10/2019  Medication Sig  . ACCU-CHEK AVIVA PLUS test strip   . Accu-Chek Softclix Lancets lancets   . acetaminophen (TYLENOL) 500 MG tablet Take 500 mg by mouth 2 (two) times daily.   Marland Kitchen albuterol (ACCUNEB) 0.63 MG/3ML nebulizer solution Take 3 mLs (  0.63 mg total) by nebulization every 6 (six) hours as needed for wheezing or shortness of breath.  Marland Kitchen albuterol (VENTOLIN HFA) 108 (90 Base) MCG/ACT inhaler Inhale 2 puffs into the lungs 4 (four) times daily as needed for wheezing or shortness of breath.  . Alcohol Swabs (ALCOHOL WIPES) 70 % PADS To use before checking blood sugars  . allopurinol (ZYLOPRIM) 100 MG tablet TAKE 2 TABLETS ONE TIME DAILY  . b complex vitamins tablet Take 1 tablet by mouth daily.  . benzonatate (TESSALON) 100 MG capsule Take 1 capsule (100 mg total) by mouth 3 (three) times daily as needed for cough.  . Blood Glucose Calibration (ACCU-CHEK AVIVA) SOLN   . blood glucose meter kit and supplies Check blood sugars once weekly.  . budesonide-formoterol (SYMBICORT) 160-4.5 MCG/ACT inhaler Inhale 2 puffs into the lungs 2 (two) times daily.  . cholecalciferol (VITAMIN D) 1000 units tablet Take 1,000 Units daily by mouth.  . famotidine (PEPCID) 20 MG tablet Take 1 tablet (20 mg total) by mouth daily.  . Ferrous Fumarate-Folic Acid (HEMOCYTE-F) 324-1 MG TABS Take 1 tablet by mouth daily.  . fluticasone (FLONASE) 50 MCG/ACT nasal spray Place 1 spray into both nostrils daily. (Patient taking differently: Place 1 spray into both nostrils daily as needed for allergies. )  . furosemide (LASIX) 40 MG tablet TAKE 1 TABLET DAILY AND A SECOND TABLET DAILY AS NEEDED FOR INCREASED EDEMA OR WEIGHT GAIN GREATER THAN 3 POUNDS IN 24 HOURS  . loratadine (CLARITIN) 10 MG tablet Take 1 tablet (10 mg total) by mouth at bedtime. (Patient taking differently: Take 10 mg by mouth at bedtime as needed for allergies. )  . metoprolol succinate (TOPROL-XL) 50 MG 24 hr tablet TAKE 1 AND 1/2  TABLETS EVERY DAY  . montelukast (SINGULAIR) 10 MG tablet TAKE 1 TABLET AT BEDTIME  . Olopatadine HCl 0.2 % SOLN   . OXYGEN Inhale 2 L into the lungs continuous.   . pantoprazole (PROTONIX) 40 MG tablet Take 40 mg by mouth 2 (two) times daily.  . potassium chloride SA (K-DUR) 20 MEQ tablet TAKE 1 TABLET DAILY, AND TAKE A SECOND TABLET DAILY AS NEEDED FOR EXTRA FUROSEMIDE TABLET AS DIRECTED.  Marland Kitchen pravastatin (PRAVACHOL) 40 MG tablet Take 1 tablet (40 mg total) by mouth every evening.  . Probiotic Product (PROBIOTIC PO) Take 1 tablet by mouth daily.   . sertraline (ZOLOFT) 100 MG tablet TAKE 1 TABLET EVERY DAY  . traMADol (ULTRAM) 50 MG tablet TAKE 1 TABLET BY MOUTH THREE TIMES DAILY AS NEEDED FOR MODERATE PAIN  . trolamine salicylate (ASPERCREME) 10 % cream Apply 1 application topically as needed for muscle pain.  . vitamin B-12 (CYANOCOBALAMIN) 1000 MCG tablet Take 1,000 mcg by mouth daily.    No facility-administered encounter medications on file as of 02/10/2019.    Activities of Daily Living In your present state of health, do you have any difficulty performing the following activities: 02/10/2019  Hearing? Y  Comment has hearing aid.  Vision? N  Difficulty concentrating or making decisions? N  Walking or climbing stairs? N  Dressing or bathing? N  Doing errands, shopping? N  Preparing Food and eating ? N  Using the Toilet? N  In the past six months, have you accidently leaked urine? N  Do you have problems with loss of bowel control? N  Managing your Medications? N  Managing your Finances? N  Housekeeping or managing your Housekeeping? N  Some recent data might be hidden  Patient Care Team: Mosie Lukes, MD as PCP - General (Family Medicine) Sueanne Margarita, MD as PCP - Cardiology (Cardiology) Shirley Muscat Loreen Freud, MD as Referring Physician (Optometry) Parrett, Fonnie Mu, NP as Nurse Practitioner (Pulmonary Disease) Rigoberto Noel, MD as Consulting Physician (Pulmonary  Disease) Donato Heinz, MD as Consulting Physician (Nephrology)    Assessment:   This is a routine wellness examination for Catharina. Physical assessment deferred to PCP.  Exercise Activities and Dietary recommendations Current Exercise Habits: The patient does not participate in regular exercise at present, Exercise limited by: None identified Diet (meal preparation, eat out, water intake, caffeinated beverages, dairy products, fruits and vegetables): well balanced     Goals    . BEGIN [employer] AT LEAST TWICE PER WEEK. (pt-stated)    . Eat more fruits and vegetables    . Go to bed at the same time every night.       Fall Risk Fall Risk  02/10/2019 02/08/2018 02/05/2017 11/28/2016 01/07/2016  Falls in the past year? 1 1 No No Yes  Number falls in past yr: 0 0 - - 1  Comment - - - - Caught toe on a step and fell forward.  Injury with Fall? 0 1 - - -  Comment - - - - -  Follow up Education provided;Falls prevention discussed - - - Falls prevention discussed    Depression Screen PHQ 2/9 Scores 02/10/2019 02/08/2018 02/05/2017 11/28/2016  PHQ - 2 Score 0 0 1 1  PHQ- 9 Score - - - -     Cognitive Function Ad8 score reviewed for issues:  Issues making decisions:no  Less interest in hobbies / activities:no  Repeats questions, stories (family complaining):no  Trouble using ordinary gadgets (microwave, computer, phone):no  Forgets the month or year: no  Mismanaging finances: no  Remembering appts:no  Daily problems with thinking and/or memory:no Ad8 score is=0     MMSE - Mini Mental State Exam 02/05/2017 01/07/2016  Orientation to time 5 4  Orientation to Place 5 5  Registration 3 3  Attention/ Calculation 5 5  Recall 3 3  Language- name 2 objects 2 2  Language- repeat 1 1  Language- follow 3 step command 3 3  Language- read & follow direction 1 1  Write a sentence 1 1  Copy design 1 0  Total score 30 28        Immunization History  Administered Date(s)  Administered  . Fluad Quad(high Dose 65+) 11/11/2018  . Influenza Split 12/08/2011  . Influenza Whole 10/20/2006, 11/09/2007, 11/14/2008, 10/30/2009  . Influenza, High Dose Seasonal PF 09/25/2015, 10/21/2015, 10/26/2017  . Influenza,inj,Quad PF,6+ Mos 10/20/2012, 11/28/2013, 10/02/2014, 09/18/2016  . Pneumococcal Conjugate-13 02/21/2013  . Pneumococcal Polysaccharide-23 06/30/2006  . Td 06/01/2007     Screening Tests Health Maintenance  Topic Date Due  . URINE MICROALBUMIN  03/01/2015  . OPHTHALMOLOGY EXAM  10/08/2015  . FOOT EXAM  04/09/2016  . TETANUS/TDAP  05/31/2017  . HEMOGLOBIN A1C  05/21/2018  . COLONOSCOPY  11/10/2018  . INFLUENZA VACCINE  Completed  . DEXA SCAN  Completed  . PNA vac Low Risk Adult  Completed       Plan:    Please schedule your next medicare wellness visit with me in 1 yr.  Continue to eat heart healthy diet (full of fruits, vegetables, whole grains, lean protein, water--limit salt, fat, and sugar intake) and increase physical activity as tolerated.  Continue doing brain stimulating activities (puzzles, reading, adult coloring books, staying  active) to keep memory sharp.   Bring a copy of your living will and/or healthcare power of attorney to your next office visit.    I have personally reviewed and noted the following in the patient's chart:   . Medical and social history . Use of alcohol, tobacco or illicit drugs  . Current medications and supplements . Functional ability and status . Nutritional status . Physical activity . Advanced directives . List of other physicians . Hospitalizations, surgeries, and ER visits in previous 12 months . Vitals . Screenings to include cognitive, depression, and falls . Referrals and appointments  In addition, I have reviewed and discussed with patient certain preventive protocols, quality metrics, and best practice recommendations. A written personalized care plan for preventive services as well as  general preventive health recommendations were provided to patient.     Shela Nevin, [place]  02/10/2019

## 2019-02-10 NOTE — Patient Instructions (Signed)
Please schedule your next medicare wellness visit with me in 1 yr.  Continue to eat heart healthy diet (full of fruits, vegetables, whole grains, lean protein, water--limit salt, fat, and sugar intake) and increase physical activity as tolerated.  Continue doing brain stimulating activities (puzzles, reading, adult coloring books, staying active) to keep memory sharp.   Bring a copy of your living will and/or healthcare power of attorney to your next office visit.   [name] , Thank you for taking time to come for your Medicare Wellness Visit. I appreciate your ongoing commitment to your health goals. Please review the following plan we discussed and let me know if I can assist you in the future.   These are the goals we discussed: Goals    . BEGIN [employer] AT LEAST TWICE PER WEEK. (pt-stated)    . Eat more fruits and vegetables    . Go to bed at the same time every night.       This is a list of the screening recommended for you and due dates:  Health Maintenance  Topic Date Due  . Urine Protein Check  03/01/2015  . Eye exam for diabetics  10/08/2015  . Complete foot exam   04/09/2016  . Tetanus Vaccine  05/31/2017  . Hemoglobin A1C  05/21/2018  . Colon Cancer Screening  11/10/2018  . Flu Shot  Completed  . DEXA scan (bone density measurement)  Completed  . Pneumonia vaccines  Completed    Preventive Care 95 Years and Older, Female Preventive care refers to lifestyle choices and visits with your health care provider that can promote health and wellness. This includes:  A yearly physical exam. This is also called an annual well check.  Regular dental and eye exams.  Immunizations.  Screening for certain conditions.  Healthy lifestyle choices, such as diet and exercise. What can I expect for my preventive care visit? Physical exam Your health care provider will check:  Height and weight. These may be used to calculate body mass index (BMI), which is a measurement that  tells if you are at a healthy weight.  Heart rate and blood pressure.  Your skin for abnormal spots. Counseling Your health care provider may ask you questions about:  Alcohol, tobacco, and drug use.  Emotional well-being.  Home and relationship well-being.  Sexual activity.  Eating habits.  History of falls.  Memory and ability to understand (cognition).  Work and work Statistician.  Pregnancy and menstrual history. What immunizations do I need?  Influenza (flu) vaccine  This is recommended every year. Tetanus, diphtheria, and pertussis (Tdap) vaccine  You may need a Td booster every 10 years. Varicella (chickenpox) vaccine  You may need this vaccine if you have not already been vaccinated. Zoster (shingles) vaccine  You may need this after age 10. Pneumococcal conjugate (PCV13) vaccine  One dose is recommended after age 67. Pneumococcal polysaccharide (PPSV23) vaccine  One dose is recommended after age 13. Measles, mumps, and rubella (MMR) vaccine  You may need at least one dose of MMR if you were born in 1957 or later. You may also need a second dose. Meningococcal conjugate (MenACWY) vaccine  You may need this if you have certain conditions. Hepatitis A vaccine  You may need this if you have certain conditions or if you travel or work in places where you may be exposed to hepatitis A. Hepatitis B vaccine  You may need this if you have certain conditions or if you travel or  work in places where you may be exposed to hepatitis B. Haemophilus influenzae type b (Hib) vaccine  You may need this if you have certain conditions. You may receive vaccines as individual doses or as more than one vaccine together in one shot (combination vaccines). Talk with your health care provider about the risks and benefits of combination vaccines. What tests do I need? Blood tests  Lipid and cholesterol levels. These may be checked every 5 years, or more frequently  depending on your overall health.  Hepatitis C test.  Hepatitis B test. Screening  Lung cancer screening. You may have this screening every year starting at age 63 if you have a 30-pack-year history of smoking and currently smoke or have quit within the past 15 years.  Colorectal cancer screening. All adults should have this screening starting at age 66 and continuing until age 24. Your health care provider may recommend screening at age 60 if you are at increased risk. You will have tests every 1-10 years, depending on your results and the type of screening test.  Diabetes screening. This is done by checking your blood sugar (glucose) after you have not eaten for a while (fasting). You may have this done every 1-3 years.  Mammogram. This may be done every 1-2 years. Talk with your health care provider about how often you should have regular mammograms.  BRCA-related cancer screening. This may be done if you have a family history of breast, ovarian, tubal, or peritoneal cancers. Other tests  Sexually transmitted disease (STD) testing.  Bone density scan. This is done to screen for osteoporosis. You may have this done starting at age 52. Follow these instructions at home: Eating and drinking  Eat a diet that includes fresh fruits and vegetables, whole grains, lean protein, and low-fat dairy products. Limit your intake of foods with high amounts of sugar, saturated fats, and salt.  Take vitamin and mineral supplements as recommended by your health care provider.  Do not drink alcohol if your health care provider tells you not to drink.  If you drink alcohol: ? Limit how much you have to 0-1 drink a day. ? Be aware of how much alcohol is in your drink. In the [place] one drink equals one 12 oz bottle of beer (355 mL), one 5 oz glass of wine (148 mL), or one 1 oz glass of hard liquor (44 mL). Lifestyle  Take daily care of your teeth and gums.  Stay active. Exercise for at least 30  minutes on 5 or more days each week.  Do not use any products that contain nicotine or tobacco, such as cigarettes, e-cigarettes, and chewing tobacco. If you need help quitting, ask your health care provider.  If you are sexually active, practice safe sex. Use a condom or other form of protection in order to prevent STIs (sexually transmitted infections).  Talk with your health care provider about taking a low-dose aspirin or statin. What's next?  Go to your health care provider once a year for a well check visit.  Ask your health care provider how often you should have your eyes and teeth checked.  Stay up to date on all vaccines. This information is not intended to replace advice given to you by your health care provider. Make sure you discuss any questions you have with your health care provider. Document Revised: 12/31/2017 Document Reviewed: 12/31/2017 Elsevier Patient Education  2020 [employer].

## 2019-02-14 DIAGNOSIS — J4520 Mild intermittent asthma, uncomplicated: Secondary | ICD-10-CM | POA: Diagnosis not present

## 2019-02-14 DIAGNOSIS — R0902 Hypoxemia: Secondary | ICD-10-CM | POA: Diagnosis not present

## 2019-02-15 ENCOUNTER — Encounter: Payer: Self-pay | Admitting: Family Medicine

## 2019-02-15 ENCOUNTER — Other Ambulatory Visit: Payer: Self-pay

## 2019-02-15 ENCOUNTER — Ambulatory Visit (INDEPENDENT_AMBULATORY_CARE_PROVIDER_SITE_OTHER): Payer: Medicare HMO | Admitting: Family Medicine

## 2019-02-15 VITALS — BP 102/58 | HR 70 | Temp 98.3°F | Resp 18 | Wt 192.0 lb

## 2019-02-15 DIAGNOSIS — E559 Vitamin D deficiency, unspecified: Secondary | ICD-10-CM

## 2019-02-15 DIAGNOSIS — I5042 Chronic combined systolic (congestive) and diastolic (congestive) heart failure: Secondary | ICD-10-CM | POA: Diagnosis not present

## 2019-02-15 DIAGNOSIS — R7989 Other specified abnormal findings of blood chemistry: Secondary | ICD-10-CM

## 2019-02-15 DIAGNOSIS — E782 Mixed hyperlipidemia: Secondary | ICD-10-CM

## 2019-02-15 DIAGNOSIS — J9612 Chronic respiratory failure with hypercapnia: Secondary | ICD-10-CM

## 2019-02-15 DIAGNOSIS — N289 Disorder of kidney and ureter, unspecified: Secondary | ICD-10-CM | POA: Diagnosis not present

## 2019-02-15 DIAGNOSIS — M109 Gout, unspecified: Secondary | ICD-10-CM

## 2019-02-15 DIAGNOSIS — Z0000 Encounter for general adult medical examination without abnormal findings: Secondary | ICD-10-CM

## 2019-02-15 DIAGNOSIS — M353 Polymyalgia rheumatica: Secondary | ICD-10-CM

## 2019-02-15 DIAGNOSIS — I10 Essential (primary) hypertension: Secondary | ICD-10-CM

## 2019-02-15 DIAGNOSIS — J9611 Chronic respiratory failure with hypoxia: Secondary | ICD-10-CM

## 2019-02-15 DIAGNOSIS — E118 Type 2 diabetes mellitus with unspecified complications: Secondary | ICD-10-CM | POA: Diagnosis not present

## 2019-02-15 LAB — HEMOGLOBIN A1C: Hgb A1c MFr Bld: 5.7 % (ref 4.6–6.5)

## 2019-02-15 LAB — TSH: TSH: 4.87 u[IU]/mL — ABNORMAL HIGH (ref 0.35–4.50)

## 2019-02-15 LAB — URIC ACID: Uric Acid, Serum: 4.8 mg/dL (ref 2.4–7.0)

## 2019-02-15 LAB — VITAMIN D 25 HYDROXY (VIT D DEFICIENCY, FRACTURES): VITD: 36.63 ng/mL (ref 30.00–100.00)

## 2019-02-15 NOTE — Patient Instructions (Addendum)
[email or web address]  [email or web address]  Multivitamin with minerals, selenium  Vitamin D 2000 IU daily  Aspirin 81 mg daily Melatonin 2-5 at bedtime  Weekly vitals  Preventive Care 78 Years and Older, Female Preventive care refers to lifestyle choices and visits with your health care provider that can promote health and wellness. This includes:  A yearly physical exam. This is also called an annual well check.  Regular dental and eye exams.  Immunizations.  Screening for certain conditions.  Healthy lifestyle choices, such as diet and exercise. What can I expect for my preventive care visit? Physical exam Your health care provider will check:  Height and weight. These may be used to calculate body mass index (BMI), which is a measurement that tells if you are at a healthy weight.  Heart rate and blood pressure.  Your skin for abnormal spots. Counseling Your health care provider may ask you questions about:  Alcohol, tobacco, and drug use.  Emotional well-being.  Home and relationship well-being.  Sexual activity.  Eating habits.  History of falls.  Memory and ability to understand (cognition).  Work and work Statistician.  Pregnancy and menstrual history. What immunizations do I need?  Influenza (flu) vaccine  This is recommended every year. Tetanus, diphtheria, and pertussis (Tdap) vaccine  You may need a Td booster every 10 years. Varicella (chickenpox) vaccine  You may need this vaccine if you have not already been vaccinated. Zoster (shingles) vaccine  You may need this after age 78. Pneumococcal conjugate (PCV13) vaccine  One dose is recommended after age 78. Pneumococcal polysaccharide (PPSV23) vaccine  One dose is recommended after age 78. Measles, mumps, and rubella (MMR) vaccine  You may need at least one dose of MMR if you were born in 1957 or later. You may also need a second dose. Meningococcal conjugate  (MenACWY) vaccine  You may need this if you have certain conditions. Hepatitis A vaccine  You may need this if you have certain conditions or if you travel or work in places where you may be exposed to hepatitis A. Hepatitis B vaccine  You may need this if you have certain conditions or if you travel or work in places where you may be exposed to hepatitis B. Haemophilus influenzae type b (Hib) vaccine  You may need this if you have certain conditions. You may receive vaccines as individual doses or as more than one vaccine together in one shot (combination vaccines). Talk with your health care provider about the risks and benefits of combination vaccines. What tests do I need? Blood tests  Lipid and cholesterol levels. These may be checked every 5 years, or more frequently depending on your overall health.  Hepatitis C test.  Hepatitis B test. Screening  Lung cancer screening. You may have this screening every year starting at age 78 if you have a 30-pack-year history of smoking and currently smoke or have quit within the past 15 years.  Colorectal cancer screening. All adults should have this screening starting at age 78 and continuing until age 78. Your health care provider may recommend screening at age 54 if you are at increased risk. You will have tests every 1-10 years, depending on your results and the type of screening test.  Diabetes screening. This is done by checking your blood sugar (glucose) after you have not eaten for a while (fasting). You may have this done every 1-3 years.  Mammogram. This may be done every 1-2 years. Talk with your health care provider about how  often you should have regular mammograms.  BRCA-related cancer screening. This may be done if you have a family history of breast, ovarian, tubal, or peritoneal cancers. Other tests  Sexually transmitted disease (STD) testing.  Bone density scan. This is done to screen for osteoporosis. You may have this  done starting at age 78. Follow these instructions at home: Eating and drinking  Eat a diet that includes fresh fruits and vegetables, whole grains, lean protein, and low-fat dairy products. Limit your intake of foods with high amounts of sugar, saturated fats, and salt.  Take vitamin and mineral supplements as recommended by your health care provider.  Do not drink alcohol if your health care provider tells you not to drink.  If you drink alcohol: ? Limit how much you have to 0-1 drink a day. ? Be aware of how much alcohol is in your drink. In the [place] one drink equals one 12 oz bottle of beer (355 mL), one 5 oz glass of wine (148 mL), or one 1 oz glass of hard liquor (44 mL). Lifestyle  Take daily care of your teeth and gums.  Stay active. Exercise for at least 30 minutes on 5 or more days each week.  Do not use any products that contain nicotine or tobacco, such as cigarettes, e-cigarettes, and chewing tobacco. If you need help quitting, ask your health care provider.  If you are sexually active, practice safe sex. Use a condom or other form of protection in order to prevent STIs (sexually transmitted infections).  Talk with your health care provider about taking a low-dose aspirin or statin. What's next?  Go to your health care provider once a year for a well check visit.  Ask your health care provider how often you should have your eyes and teeth checked.  Stay up to date on all vaccines. This information is not intended to replace advice given to you by your health care provider. Make sure you discuss any questions you have with your health care provider. Document Revised: 12/31/2017 Document Reviewed: 12/31/2017 Elsevier Patient Education  2020 [employer].

## 2019-02-16 ENCOUNTER — Inpatient Hospital Stay: Payer: Medicare HMO | Attending: Family

## 2019-02-16 ENCOUNTER — Encounter: Payer: Self-pay | Admitting: Hematology & Oncology

## 2019-02-16 ENCOUNTER — Inpatient Hospital Stay (HOSPITAL_BASED_OUTPATIENT_CLINIC_OR_DEPARTMENT_OTHER): Payer: Medicare HMO | Admitting: Hematology & Oncology

## 2019-02-16 ENCOUNTER — Inpatient Hospital Stay: Payer: Medicare HMO

## 2019-02-16 VITALS — BP 124/67 | HR 77 | Temp 97.5°F | Resp 20 | Wt 190.1 lb

## 2019-02-16 DIAGNOSIS — Z79899 Other long term (current) drug therapy: Secondary | ICD-10-CM | POA: Insufficient documentation

## 2019-02-16 DIAGNOSIS — N184 Chronic kidney disease, stage 4 (severe): Secondary | ICD-10-CM | POA: Diagnosis not present

## 2019-02-16 DIAGNOSIS — D631 Anemia in chronic kidney disease: Secondary | ICD-10-CM

## 2019-02-16 DIAGNOSIS — N183 Chronic kidney disease, stage 3 unspecified: Secondary | ICD-10-CM | POA: Diagnosis not present

## 2019-02-16 DIAGNOSIS — D509 Iron deficiency anemia, unspecified: Secondary | ICD-10-CM | POA: Diagnosis not present

## 2019-02-16 DIAGNOSIS — D508 Other iron deficiency anemias: Secondary | ICD-10-CM

## 2019-02-16 LAB — CBC WITH DIFFERENTIAL (CANCER CENTER ONLY)
Abs Immature Granulocytes: 0.04 10*3/uL (ref 0.00–0.07)
Basophils Absolute: 0 10*3/uL (ref 0.0–0.1)
Basophils Relative: 0 %
Eosinophils Absolute: 0.3 10*3/uL (ref 0.0–0.5)
Eosinophils Relative: 5 %
HCT: 32 % — ABNORMAL LOW (ref 36.0–46.0)
Hemoglobin: 9.9 g/dL — ABNORMAL LOW (ref 12.0–15.0)
Immature Granulocytes: 1 %
Lymphocytes Relative: 21 %
Lymphs Abs: 1.1 10*3/uL (ref 0.7–4.0)
MCH: 31.1 pg (ref 26.0–34.0)
MCHC: 30.9 g/dL (ref 30.0–36.0)
MCV: 100.6 fL — ABNORMAL HIGH (ref 80.0–100.0)
Monocytes Absolute: 0.6 10*3/uL (ref 0.1–1.0)
Monocytes Relative: 11 %
Neutro Abs: 3.2 10*3/uL (ref 1.7–7.7)
Neutrophils Relative %: 62 %
Platelet Count: 110 10*3/uL — ABNORMAL LOW (ref 150–400)
RBC: 3.18 MIL/uL — ABNORMAL LOW (ref 3.87–5.11)
RDW: 14.2 % (ref 11.5–15.5)
WBC Count: 5.2 10*3/uL (ref 4.0–10.5)
nRBC: 0 % (ref 0.0–0.2)

## 2019-02-16 LAB — CMP (CANCER CENTER ONLY)
ALT: 9 U/L (ref 0–44)
AST: 15 U/L (ref 15–41)
Albumin: 4.3 g/dL (ref 3.5–5.0)
Alkaline Phosphatase: 45 U/L (ref 38–126)
Anion gap: 6 (ref 5–15)
BUN: 56 mg/dL — ABNORMAL HIGH (ref 8–23)
CO2: 35 mmol/L — ABNORMAL HIGH (ref 22–32)
Calcium: 9.9 mg/dL (ref 8.9–10.3)
Chloride: 101 mmol/L (ref 98–111)
Creatinine: 2.01 mg/dL — ABNORMAL HIGH (ref 0.44–1.00)
GFR, Est AFR Am: 27 mL/min — ABNORMAL LOW (ref >60)
GFR, Estimated: 23 mL/min — ABNORMAL LOW (ref >60)
Glucose, Bld: 93 mg/dL (ref 70–99)
Potassium: 4.9 mmol/L (ref 3.5–5.1)
Sodium: 142 mmol/L (ref 135–145)
Total Bilirubin: 0.5 mg/dL (ref 0.3–1.2)
Total Protein: 6.7 g/dL (ref 6.5–8.1)

## 2019-02-16 LAB — RETICULOCYTES
Immature Retic Fract: 8.2 % (ref 2.3–15.9)
RBC.: 3.07 MIL/uL — ABNORMAL LOW (ref 3.87–5.11)
Retic Count, Absolute: 43 10*3/uL (ref 19.0–186.0)
Retic Ct Pct: 1.4 % (ref 0.4–3.1)

## 2019-02-16 IMAGING — CT CT HEAD W/O CM
3 of 4 series · 13 of 47 positions shown, 15 images · non-contrast
Comparison: MRI of the head November 05, 2017.

CLINICAL DATA: Follow-up subdural hematoma.

EXAM:
CT HEAD WITHOUT CONTRAST
TECHNIQUE: Contiguous axial images were obtained from the base of the skull
through the vertex without intravenous contrast.

[Series 3: head without · axial · non-contrast · 0.42mm/px · z∈[-186,-66]mm · 7 of 32 slices shown, 9 images]
[im 4/32  brain]
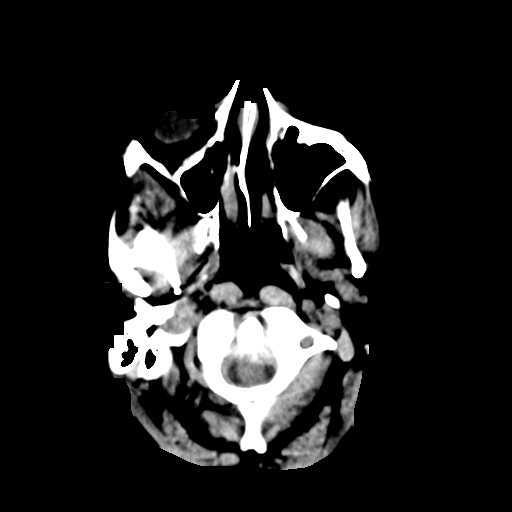
[im 4/32  bone]
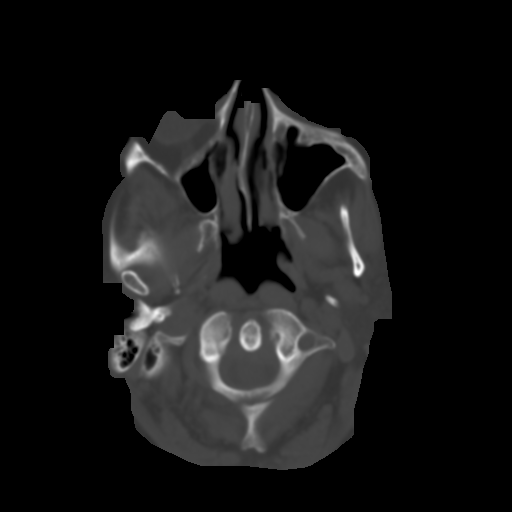
[im 8/32  brain]
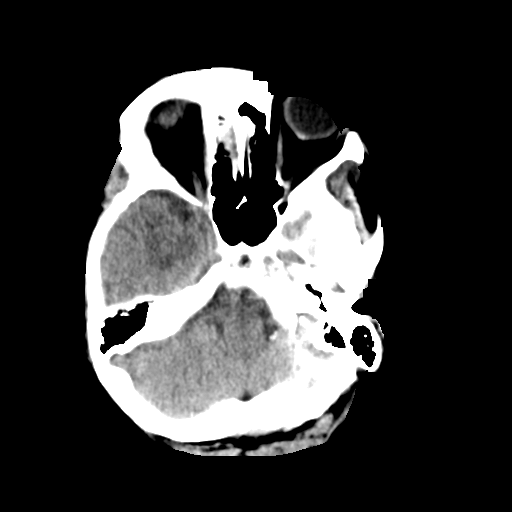
[im 12/32  brain]
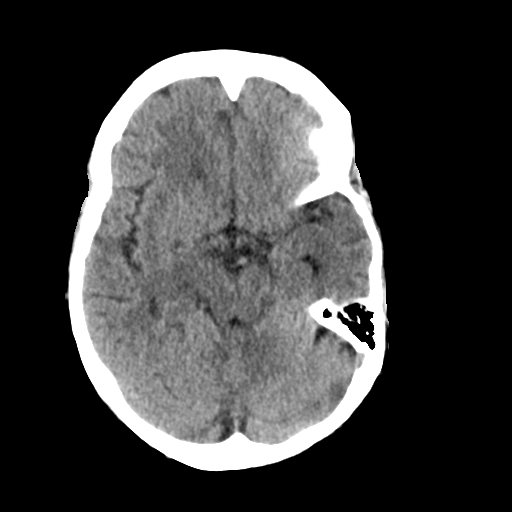
[im 16/32  brain]
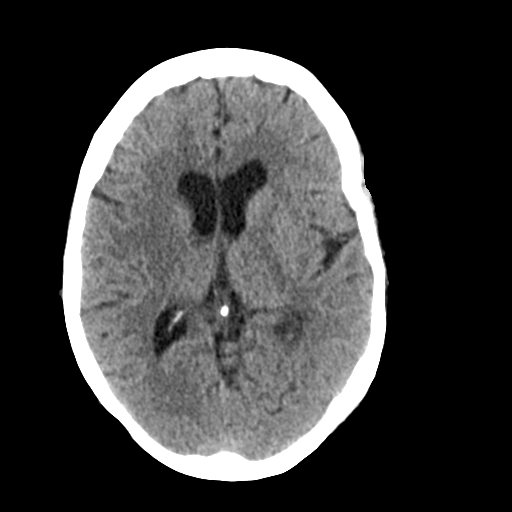
[im 20/32  brain]
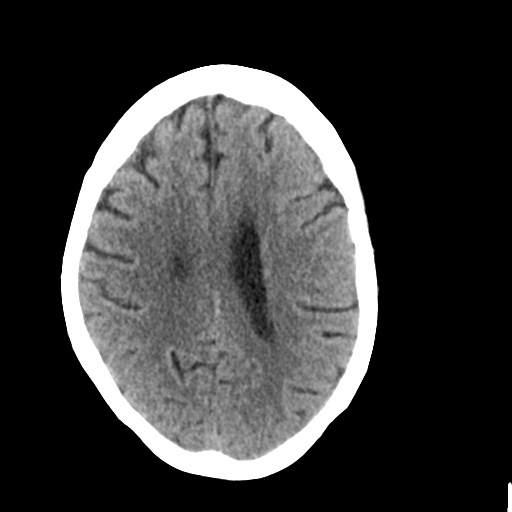
[im 20/32  bone]
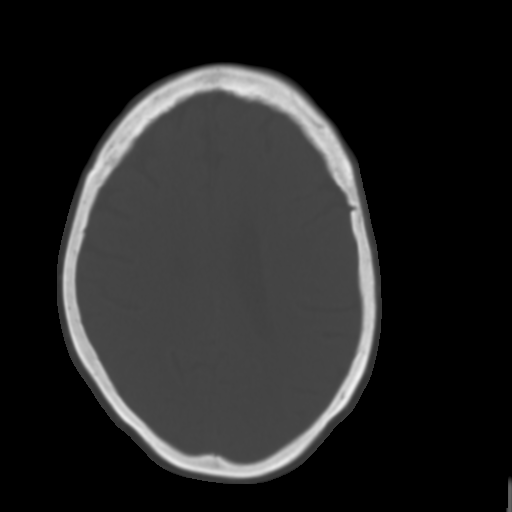
[im 24/32  brain]
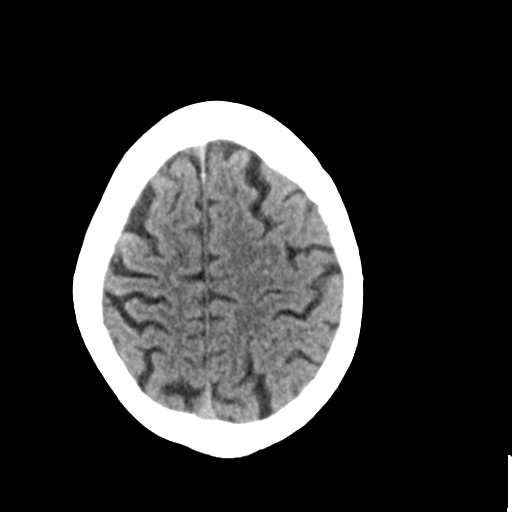
[im 28/32  brain]
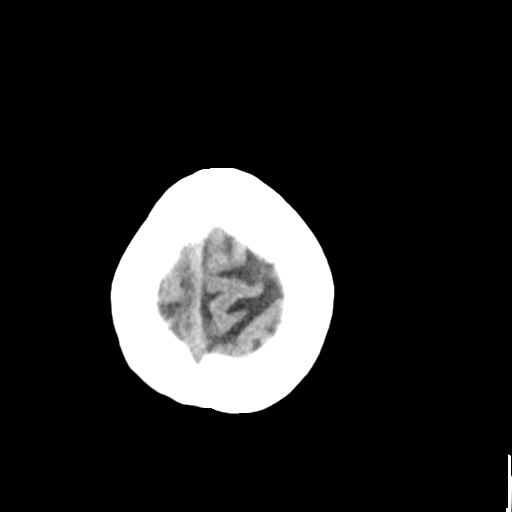

[Series 5: head without cor · coronal · non-contrast · 0.31mm/px · 3 of 72 slices shown]
[im 24/72  brain]
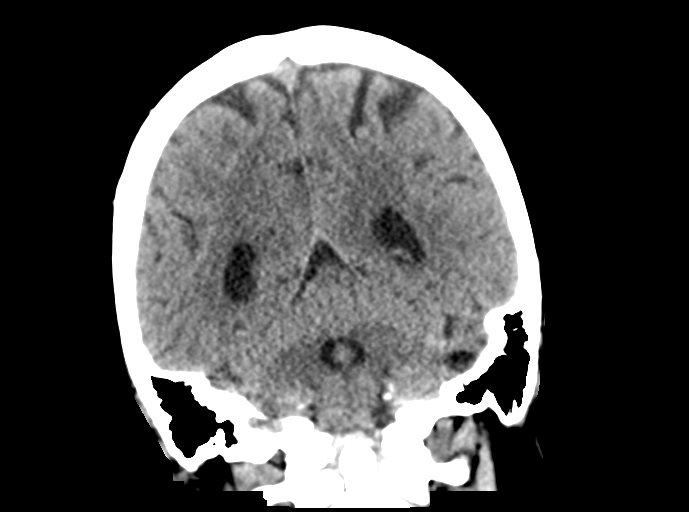
[im 32/72  brain]
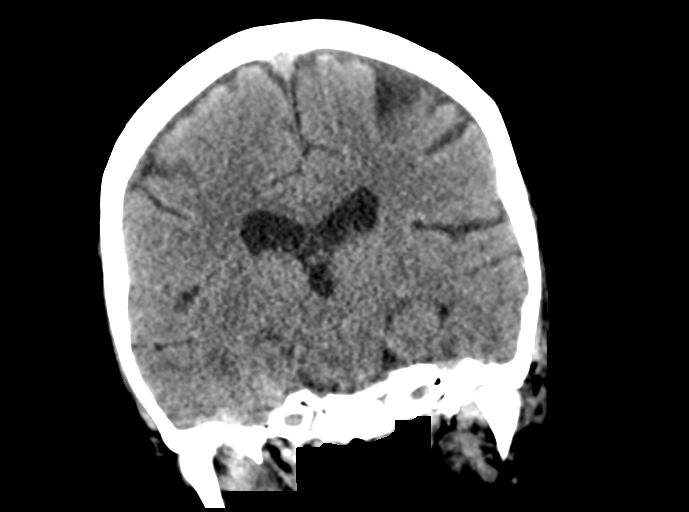
[im 40/72  brain]
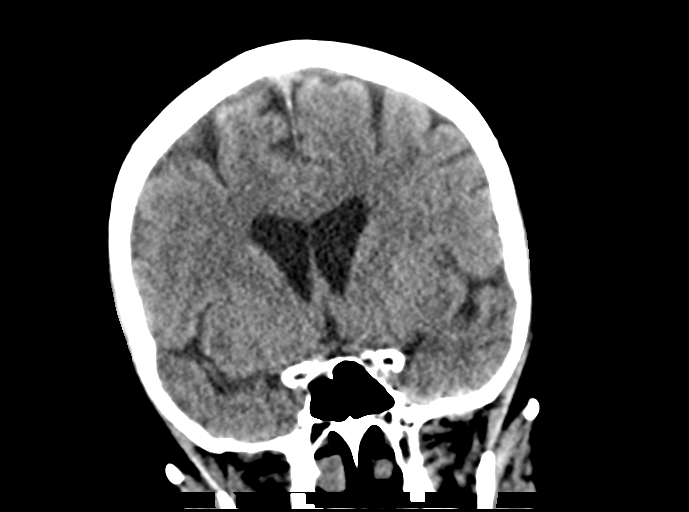

[Series 6: head without sag · sagittal · non-contrast · 0.31mm/px · 3 of 65 slices shown]
[im 22/65  brain]
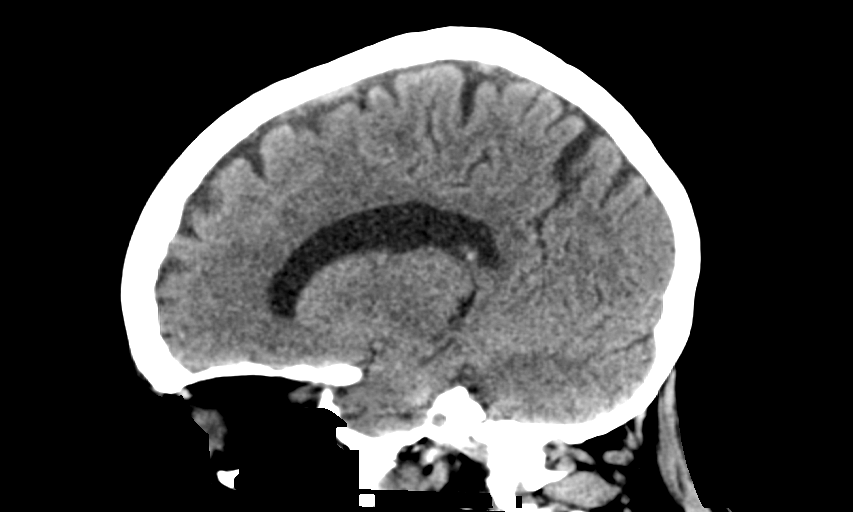
[im 33/65  brain]
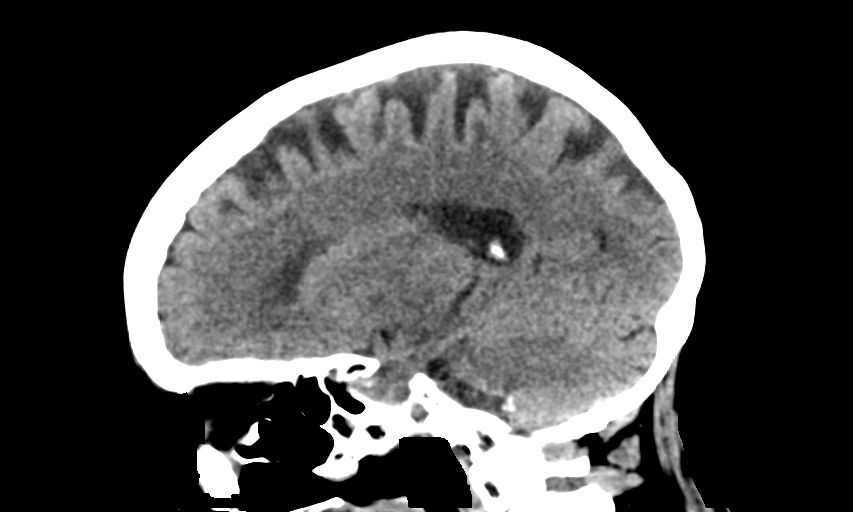
[im 43/65  brain]
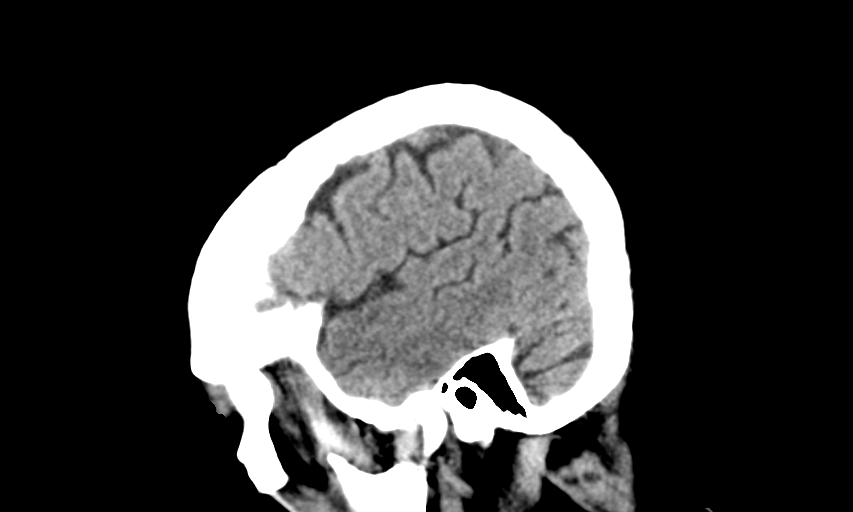

[13 of 47 positions shown; findings below may reference images not displayed]

FINDINGS: BRAIN: No intraparenchymal hemorrhage, mass effect nor midline
shift. The ventricles and sulci are normal for age. Patchy
supratentorial white matter hypodensities less than expected for age
for patient's age, though non-specific are most compatible with
chronic small vessel ischemic disease. No acute large vascular
territory infarcts. No abnormal extra-axial fluid collections. Basal
cisterns are patent.

VASCULAR: Mild calcific atherosclerosis of the carotid siphons.

SKULL: No skull fracture. No significant scalp soft tissue swelling.

SINUSES/ORBITS: Trace paranasal sinus mucosal thickening. Mastoid
air cells are well aerated.The included ocular globes and orbital
contents are non-suspicious. Status post bilateral ocular lens
implants.

OTHER: None.
IMPRESSION: 1. Normal noncontrast CT HEAD for age. Resolution of subdural
hematomas.

## 2019-02-16 MED ORDER — EPOETIN ALFA-EPBX 40000 UNIT/ML IJ SOLN
40000.0000 [IU] | Freq: Once | INTRAMUSCULAR | Status: AC
  Administered 2019-02-16: 15:00:00 40000 [IU] via SUBCUTANEOUS

## 2019-02-16 MED ORDER — EPOETIN ALFA-EPBX 40000 UNIT/ML IJ SOLN
INTRAMUSCULAR | Status: AC
  Filled 2019-02-16: qty 1

## 2019-02-16 NOTE — Assessment & Plan Note (Signed)
hgba1c acceptable, minimize simple carbs. Increase exercise as tolerated. Continue current meds 

## 2019-02-16 NOTE — Assessment & Plan Note (Addendum)
Encouraged heart healthy diet, increase exercise, avoid trans fats, consider a krill oil cap daily. Tolerating Pravastatin  

## 2019-02-16 NOTE — Assessment & Plan Note (Addendum)
Supplement and monitor, she is following with nephrology

## 2019-02-16 NOTE — Assessment & Plan Note (Signed)
She is maintained on 3 L O2 most of the time and doing well.

## 2019-02-16 NOTE — Assessment & Plan Note (Signed)
Is doing much better and has no significant pain today

## 2019-02-16 NOTE — Progress Notes (Signed)
Subjective:    Patient ID: [name], female    DOB: 1940-04-30, 79 y.o.   MRN: [id number]  Chief Complaint  Patient presents with  . Annual Exam    HPI Patient is in today for annual preventative exam and follow up on chronic medical concerns. She feels well today. No recent febrile illness or hospitalizations. She is maintaining quarantine well. She is largely immobile but does manage her ADLs well. No complaints of polyuria or polydipsia. Denies CP/palp/SOB/HA/congestion/fevers/GI or GU c/o. Taking meds as prescribed. Sugars range from 89 to 125.   Past Medical History:  Diagnosis Date  . Abnormal glucose tolerance test   . Abnormal TSH 09/22/2016  . ACE-inhibitor cough   . Anemia 03/12/2014  . Arthritis   . Asthma    PFT 02/06/09 FEV1 1.41 (65%), FVC 1.92 (64), FEV1% 74, TLC 3.47 (71%), DLCO 48%, +BD  . Atypical chest pain    s/p cath, Normal coronaries, Non ST elevation myocardial infarction, Rt groin pseudoaneurysm  . Bacterial vaginosis 03/12/2014  . Cellulitis 06/22/2016  . Chronic kidney disease (CKD), stage III (moderate) 08/04/2016  . COPD (chronic obstructive pulmonary disease) ([place])   . Depression   . Diastolic heart failure ([place]) 04/07/2016  . DVT (deep venous thrombosis) ([place]) 1987  . GERD (gastroesophageal reflux disease)   . Gout   . Hypercalcemia 10/15/2014  . Hyperlipidemia   . Hypertension   . Hypoxia 10/15/2014  . Macular degeneration 04/10/2015  . Osteopenia 12/29/2006   Qualifier: Diagnosis of  By: Wynona Luna   . Pneumonia   . Polymyalgia rheumatica ([place]) 01/07/2016  . Renal insufficiency 09/22/2016  . Unspecified constipation 06/05/2013  . Vitamin D deficiency 01/01/2015    Past Surgical History:  Procedure Laterality Date  . APPENDECTOMY  1951  . TONSILLECTOMY  1950  . TUBAL LIGATION  1968    Family History  Problem Relation Age of Onset  . Asthma Sister   . Hypertension Sister   . Hyperlipidemia Sister   . Uterine cancer Sister   .  Coronary artery disease Brother        x2  . Arthritis Brother   . Lung cancer Brother        smoker  . Hypertension Sister   . Arthritis Sister   . Hyperlipidemia Sister   . Emphysema Sister   . COPD Sister        smoker  . Heart disease Sister   . Hyperlipidemia Sister   . Hypertension Sister   . Arthritis Sister   . Emphysema Brother   . Heart disease Brother         smoker  . Heart attack Brother   . Mental illness Father   . Suicidality Father   . Heart disease Mother   . Hyperlipidemia Mother   . Heart attack Mother   . Epilepsy Daughter   . Hypertension Daughter   . Obesity Daughter   . COPD Brother        smoker  . Lung cancer Brother   . Coronary artery disease Other   . Colon polyps Sister     Social History   Socioeconomic History  . Marital status: Widowed    Spouse name: Not on file  . Number of children: 1  . Years of education: Not on file  . Highest education level: Not on file  Occupational History  . Occupation: Retired    Fish farm manager: [employer]  Tobacco Use  . Smoking status:  Never Smoker  . Smokeless tobacco: Never Used  Substance and Sexual Activity  . Alcohol use: No  . Drug use: No  . Sexual activity: Never    Comment: lives alone, no dietary restrictions  Other Topics Concern  . Not on file  Social History Narrative  . Not on file   Social Determinants of Health   Financial Resource Strain:   . Difficulty of Paying Living Expenses: Not on file  Food Insecurity:   . Worried About Charity fundraiser in the Last Year: Not on file  . Ran Out of Food in the Last Year: Not on file  Transportation Needs:   . Lack of Transportation (Medical): Not on file  . Lack of Transportation (Non-Medical): Not on file  Physical Activity:   . Days of Exercise per Week: Not on file  . Minutes of Exercise per Session: Not on file  Stress:   . Feeling of Stress : Not on file  Social Connections:   . Frequency of Communication with Friends and  Family: Not on file  . Frequency of Social Gatherings with Friends and Family: Not on file  . Attends Religious Services: Not on file  . Active Member of Clubs or Organizations: Not on file  . Attends Archivist Meetings: Not on file  . Marital Status: Not on file  Intimate Partner Violence:   . Fear of Current or Ex-Partner: Not on file  . Emotionally Abused: Not on file  . Physically Abused: Not on file  . Sexually Abused: Not on file    Outpatient Medications Prior to Visit  Medication Sig Dispense Refill  . ACCU-CHEK AVIVA PLUS test strip     . Accu-Chek Softclix Lancets lancets     . acetaminophen (TYLENOL) 500 MG tablet Take 500 mg by mouth 2 (two) times daily.     Marland Kitchen albuterol (ACCUNEB) 0.63 MG/3ML nebulizer solution Take 3 mLs (0.63 mg total) by nebulization every 6 (six) hours as needed for wheezing or shortness of breath. 150 mL 0  . albuterol (VENTOLIN HFA) 108 (90 Base) MCG/ACT inhaler Inhale 2 puffs into the lungs 4 (four) times daily as needed for wheezing or shortness of breath. 54 g 1  . Alcohol Swabs (ALCOHOL WIPES) 70 % PADS To use before checking blood sugars 100 each 1  . allopurinol (ZYLOPRIM) 100 MG tablet TAKE 2 TABLETS ONE TIME DAILY 180 tablet 1  . b complex vitamins tablet Take 1 tablet by mouth daily.    . benzonatate (TESSALON) 100 MG capsule Take 1 capsule (100 mg total) by mouth 3 (three) times daily as needed for cough. 30 capsule 1  . Blood Glucose Calibration (ACCU-CHEK AVIVA) SOLN     . blood glucose meter kit and supplies Check blood sugars once weekly. 1 each 0  . budesonide-formoterol (SYMBICORT) 160-4.5 MCG/ACT inhaler Inhale 2 puffs into the lungs 2 (two) times daily. 30.6 g 1  . cholecalciferol (VITAMIN D) 1000 units tablet Take 1,000 Units daily by mouth.    . Ferrous Fumarate-Folic Acid (HEMOCYTE-F) 324-1 MG TABS Take 1 tablet by mouth daily. 90 each 1  . fluticasone (FLONASE) 50 MCG/ACT nasal spray Place 1 spray into both nostrils  daily. (Patient taking differently: Place 1 spray into both nostrils daily as needed for allergies. ) 16 g 3  . furosemide (LASIX) 40 MG tablet TAKE 1 TABLET DAILY AND A SECOND TABLET DAILY AS NEEDED FOR INCREASED EDEMA OR WEIGHT GAIN GREATER THAN 3 POUNDS IN  24 HOURS 100 tablet 5  . loratadine (CLARITIN) 10 MG tablet Take 1 tablet (10 mg total) by mouth at bedtime. (Patient taking differently: Take 10 mg by mouth at bedtime as needed for allergies. ) 30 tablet 5  . metoprolol succinate (TOPROL-XL) 50 MG 24 hr tablet TAKE 1 AND 1/2 TABLETS EVERY DAY 135 tablet 3  . montelukast (SINGULAIR) 10 MG tablet TAKE 1 TABLET AT BEDTIME 90 tablet 0  . Olopatadine HCl 0.2 % SOLN     . OXYGEN Inhale 2 L into the lungs continuous.     . pantoprazole (PROTONIX) 40 MG tablet Take 40 mg by mouth 2 (two) times daily.    . potassium chloride SA (K-DUR) 20 MEQ tablet TAKE 1 TABLET DAILY, AND TAKE A SECOND TABLET DAILY AS NEEDED FOR EXTRA FUROSEMIDE TABLET AS DIRECTED. 90 tablet 1  . pravastatin (PRAVACHOL) 40 MG tablet Take 1 tablet (40 mg total) by mouth every evening. 90 tablet 3  . Probiotic Product (PROBIOTIC PO) Take 1 tablet by mouth daily.     . sertraline (ZOLOFT) 100 MG tablet TAKE 1 TABLET EVERY DAY 90 tablet 1  . traMADol (ULTRAM) 50 MG tablet TAKE 1 TABLET BY MOUTH THREE TIMES DAILY AS NEEDED FOR MODERATE PAIN 90 tablet 0  . trolamine salicylate (ASPERCREME) 10 % cream Apply 1 application topically as needed for muscle pain.    . vitamin B-12 (CYANOCOBALAMIN) 1000 MCG tablet Take 1,000 mcg by mouth daily.     . famotidine (PEPCID) 20 MG tablet Take 1 tablet (20 mg total) by mouth daily. 90 tablet 1   No facility-administered medications prior to visit.    Allergies  Allergen Reactions  . Montelukast Sodium Other (See Comments)    REACTION: HEART PALPITATIONS, CHEST PAIN.  Marland Kitchen Sulfa Antibiotics Other (See Comments)    CHEST PAIN  . Sulfonamide Derivatives Other (See Comments)    CHEST PAIN  .  Oseltamivir Phosphate Diarrhea, Nausea And Vomiting and Other (See Comments)    TAMIFLU REACTION:  dizziness  . Ace Inhibitors Other (See Comments)    PT. STATED UNKNOWN REACTION  . Indomethacin Other (See Comments)    PT. STATED UNKNOWN REACTION    Review of Systems  Constitutional: Positive for malaise/fatigue. Negative for chills and fever.  HENT: Negative for congestion and hearing loss.   Eyes: Negative for discharge.  Respiratory: Positive for shortness of breath. Negative for cough and sputum production.   Cardiovascular: Negative for chest pain, palpitations and leg swelling.  Gastrointestinal: Negative for abdominal pain, blood in stool, constipation, diarrhea, heartburn, nausea and vomiting.  Genitourinary: Negative for dysuria, frequency, hematuria and urgency.  Musculoskeletal: Negative for back pain, falls and myalgias.  Skin: Negative for rash.  Neurological: Negative for dizziness, sensory change, loss of consciousness, weakness and headaches.  Endo/Heme/Allergies: Negative for environmental allergies. Does not bruise/bleed easily.  Psychiatric/Behavioral: Negative for depression and suicidal ideas. The patient is not nervous/anxious and does not have insomnia.        Objective:    Physical Exam Constitutional:      General: She is not in acute distress.    Appearance: She is well-developed.  HENT:     Head: Normocephalic and atraumatic.  Eyes:     Conjunctiva/sclera: Conjunctivae normal.  Neck:     Thyroid: No thyromegaly.  Cardiovascular:     Rate and Rhythm: Normal rate and regular rhythm.     Heart sounds: Normal heart sounds. No murmur.  Pulmonary:  Effort: Pulmonary effort is normal. No respiratory distress.     Breath sounds: Normal breath sounds.  Abdominal:     General: Bowel sounds are normal. There is no distension.     Palpations: Abdomen is soft. There is no mass.     Tenderness: There is no abdominal tenderness.  Musculoskeletal:         General: No swelling.     Cervical back: Neck supple.     Right lower leg: Edema present.     Left lower leg: Edema present.  Lymphadenopathy:     Cervical: No cervical adenopathy.  Skin:    General: Skin is warm and dry.  Neurological:     Mental Status: She is alert and oriented to person, place, and time.     Cranial Nerves: No cranial nerve deficit.  Psychiatric:        Behavior: Behavior normal.     BP (!) 102/58 (BP Location: Left Arm, Patient Position: Sitting, Cuff Size: Normal)   Pulse 70   Temp 98.3 F (36.8 C) (Temporal)   Resp 18   Wt 192 lb (87.1 kg)   SpO2 99%   BMI 34.01 kg/m  Wt Readings from Last 3 Encounters:  02/16/19 190 lb 1.9 oz (86.2 kg)  02/15/19 192 lb (87.1 kg)  01/31/19 191 lb 12.8 oz (87 kg)    Diabetic Foot Exam - Simple   No data filed     Lab Results  Component Value Date   WBC 5.2 02/16/2019   HGB 9.9 (L) 02/16/2019   HCT 32.0 (L) 02/16/2019   PLT 110 (L) 02/16/2019   GLUCOSE 93 02/16/2019   CHOL 149 01/31/2019   TRIG 111 01/31/2019   HDL 51 01/31/2019   LDLDIRECT 73.0 06/16/2017   LDLCALC 78 01/31/2019   ALT 9 02/16/2019   AST 15 02/16/2019   NA 142 02/16/2019   K 4.9 02/16/2019   CL 101 02/16/2019   CREATININE 2.01 (H) 02/16/2019   BUN 56 (H) 02/16/2019   CO2 35 (H) 02/16/2019   TSH 4.87 (H) 02/15/2019   INR 1.0 09/12/2006   HGBA1C 5.7 02/15/2019   MICROALBUR 0.8 02/28/2014    Lab Results  Component Value Date   TSH 4.87 (H) 02/15/2019   Lab Results  Component Value Date   WBC 5.2 02/16/2019   HGB 9.9 (L) 02/16/2019   HCT 32.0 (L) 02/16/2019   MCV 100.6 (H) 02/16/2019   PLT 110 (L) 02/16/2019   Lab Results  Component Value Date   NA 142 02/16/2019   K 4.9 02/16/2019   CO2 35 (H) 02/16/2019   GLUCOSE 93 02/16/2019   BUN 56 (H) 02/16/2019   CREATININE 2.01 (H) 02/16/2019   BILITOT 0.5 02/16/2019   ALKPHOS 45 02/16/2019   AST 15 02/16/2019   ALT 9 02/16/2019   PROT 6.7 02/16/2019   ALBUMIN 4.3  02/16/2019   CALCIUM 9.9 02/16/2019   ANIONGAP 6 02/16/2019   GFR 30.80 (L) 11/20/2017   Lab Results  Component Value Date   CHOL 149 01/31/2019   Lab Results  Component Value Date   HDL 51 01/31/2019   Lab Results  Component Value Date   LDLCALC 78 01/31/2019   Lab Results  Component Value Date   TRIG 111 01/31/2019   Lab Results  Component Value Date   CHOLHDL 2.9 01/31/2019   Lab Results  Component Value Date   HGBA1C 5.7 02/15/2019       Assessment & Plan:  Problem List Items Addressed This Visit    Essential hypertension    Well controlled, no changes to meds. Encouraged heart healthy diet such as the DASH diet and exercise as tolerated.       Relevant Orders   TSH (Completed)   Diabetes type 2, controlled ([place]) - Primary    hgba1c acceptable, minimize simple carbs. Increase exercise as tolerated. Continue current meds      Relevant Orders   Hemoglobin A1c (Completed)   Hyperlipidemia, mixed    Encouraged heart healthy diet, increase exercise, avoid trans fats, consider a krill oil cap daily. Tolerating Pravastatin      Gout   Relevant Orders   Uric acid (Completed)   Chronic respiratory failure with hypoxia and hypercapnia ([place])    She is maintained on 3 L O2 most of the time and doing well.       Polymyalgia rheumatica ([facility])    Is doing much better and has no significant pain today      Chronic combined systolic and diastolic CHF (congestive heart failure) ([facility])    Stable and no changes.       Renal insufficiency    Supplement and monitor, she is following with nephrology      Vitamin D deficiency    Supplement and monitor      High serum vitamin B12   Relevant Orders   VITAMIN D 25 Hydroxy (Vit-D Deficiency, Fractures) (Completed)      I am having [name] maintain her acetaminophen, vitamin B-12, OXYGEN, b complex vitamins, Probiotic Product (PROBIOTIC PO), loratadine, fluticasone, Ferrous Fumarate-Folic Acid,  cholecalciferol, albuterol, trolamine salicylate, benzonatate, metoprolol succinate, albuterol, budesonide-formoterol, blood glucose meter kit and supplies, Alcohol Wipes, Accu-Chek Aviva, Accu-Chek Aviva Plus, Accu-Chek Softclix Lancets, Olopatadine HCl, pantoprazole, sertraline, potassium chloride SA, traMADol, furosemide, montelukast, allopurinol, and pravastatin.  No orders of the defined types were placed in this encounter.    Penni Homans, MD

## 2019-02-16 NOTE — Assessment & Plan Note (Signed)
Well controlled, no changes to meds. Encouraged heart healthy diet such as the DASH diet and exercise as tolerated.  °

## 2019-02-16 NOTE — Assessment & Plan Note (Signed)
Supplement and monitor 

## 2019-02-16 NOTE — Patient Instructions (Signed)

## 2019-02-16 NOTE — Assessment & Plan Note (Signed)
Stable and no changes.

## 2019-02-16 NOTE — Progress Notes (Signed)
Hematology and Oncology Follow Up Visit  [name] [id number] 11-14-1940 79 y.o. 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  She is doing okay.  She got through the holidays without too much in the way of difficulty.  Para she is not had Aranesp probably for over 2 months.  She clearly needs Aranesp today.  There has been no obvious bleeding.  She is on supplemental oxygen.  She has had no problems with increased cough or shortness of breath.  She has had no issues with fever.  She has been very cautious with the coronavirus.  There is been no headache.  Para she does have leg swelling.  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  Her last iron studies done back in December showed a ferritin of 420 with iron saturation of 29%.  Overall, her performance status is ECOG 2.  Medications:  Allergies as of 02/16/2019      Reactions   Montelukast Sodium Other (See Comments)   REACTION: HEART PALPITATIONS, CHEST PAIN.   Sulfa Antibiotics Other (See Comments)   CHEST PAIN   Sulfonamide Derivatives Other (See Comments)   CHEST PAIN   Oseltamivir Phosphate Diarrhea, Nausea And Vomiting, Other (See Comments)   TAMIFLU REACTION:  dizziness   Ace Inhibitors Other (See Comments)   PT. STATED UNKNOWN REACTION   Indomethacin Other (See Comments)   PT. STATED UNKNOWN REACTION      Medication List       Accurate as of February 16, 2019  2:08 PM. If you have any questions, ask your nurse or doctor.        STOP taking these medications   famotidine 20 MG tablet Commonly known as: Pepcid Stopped by: Volanda Napoleon, MD     TAKE these medications   Accu-Chek Aviva Plus test strip Generic drug: glucose blood   Accu-Chek Aviva Soln   Accu-Chek Softclix Lancets lancets    acetaminophen 500 MG tablet Commonly known as: TYLENOL Take 500 mg by mouth 2 (two) times daily.   albuterol 0.63 MG/3ML nebulizer solution Commonly known as: ACCUNEB Take 3 mLs (0.63 mg total) by nebulization every 6 (six) hours as needed for wheezing or shortness of breath.   albuterol 108 (90 Base) MCG/ACT inhaler Commonly known as: VENTOLIN HFA Inhale 2 puffs into the lungs 4 (four) times daily as needed for wheezing or shortness of breath.   Alcohol Wipes 70 % Pads To use before checking blood sugars   allopurinol 100 MG tablet Commonly known as: ZYLOPRIM TAKE 2 TABLETS ONE TIME DAILY   b complex vitamins tablet Take 1 tablet by mouth daily.   benzonatate 100 MG capsule Commonly known as: TESSALON Take 1 capsule (100 mg total) by mouth 3 (three) times daily as needed for cough.   blood glucose meter kit and supplies Check blood sugars once weekly.   budesonide-formoterol 160-4.5 MCG/ACT inhaler Commonly known as: Symbicort Inhale 2 puffs into the lungs 2 (two) times daily.   cholecalciferol 1000 units tablet Commonly known as: VITAMIN D Take 1,000 Units daily by mouth.   Ferrous Fumarate-Folic Acid 470-9 MG Tabs Commonly known as: Hemocyte-F Take 1 tablet by mouth daily.   fluticasone 50 MCG/ACT nasal spray Commonly known as: FLONASE Place 1  spray into both nostrils daily. What changed:   when to take this  reasons to take this   furosemide 40 MG tablet Commonly known as: LASIX TAKE 1 TABLET DAILY AND A SECOND TABLET DAILY AS NEEDED FOR INCREASED EDEMA OR WEIGHT GAIN GREATER THAN 3 POUNDS IN 24 HOURS   loratadine 10 MG tablet Commonly known as: CLARITIN Take 1 tablet (10 mg total) by mouth at bedtime. What changed:   when to take this  reasons to take this   metoprolol succinate 50 MG 24 hr tablet Commonly known as: TOPROL-XL TAKE 1 AND 1/2 TABLETS EVERY DAY   montelukast 10 MG tablet Commonly known as: SINGULAIR TAKE 1 TABLET AT BEDTIME    Olopatadine HCl 0.2 % Soln   OXYGEN Inhale 2 L into the lungs continuous.   pantoprazole 40 MG tablet Commonly known as: PROTONIX Take 40 mg by mouth 2 (two) times daily.   potassium chloride SA 20 MEQ tablet Commonly known as: KLOR-CON TAKE 1 TABLET DAILY, AND TAKE A SECOND TABLET DAILY AS NEEDED FOR EXTRA FUROSEMIDE TABLET AS DIRECTED.   pravastatin 40 MG tablet Commonly known as: PRAVACHOL Take 1 tablet (40 mg total) by mouth every evening.   PROBIOTIC PO Take 1 tablet by mouth daily.   sertraline 100 MG tablet Commonly known as: ZOLOFT TAKE 1 TABLET EVERY DAY   traMADol 50 MG tablet Commonly known as: ULTRAM TAKE 1 TABLET BY MOUTH THREE TIMES DAILY AS NEEDED FOR MODERATE PAIN   trolamine salicylate 10 % cream Commonly known as: ASPERCREME Apply 1 application topically as needed for muscle pain.   vitamin B-12 1000 MCG tablet Commonly known as: CYANOCOBALAMIN Take 1,000 mcg by mouth daily.       Allergies:  Allergies  Allergen Reactions  . Montelukast Sodium Other (See Comments)    REACTION: HEART PALPITATIONS, CHEST PAIN.  Marland Kitchen Sulfa Antibiotics Other (See Comments)    CHEST PAIN  . Sulfonamide Derivatives Other (See Comments)    CHEST PAIN  . Oseltamivir Phosphate Diarrhea, Nausea And Vomiting and Other (See Comments)    TAMIFLU REACTION:  dizziness  . Ace Inhibitors Other (See Comments)    PT. STATED UNKNOWN REACTION  . Indomethacin Other (See Comments)    PT. STATED UNKNOWN REACTION    Past Medical History, Surgical history, Social history, and Family History were reviewed and updated.  Review of Systems: All other 10 point review of systems is negative.   Physical Exam:  weight is 190 lb 1.9 oz (86.2 kg). Her temporal temperature is 97.5 F (36.4 C) (abnormal). Her blood pressure is 124/67 and her pulse is 77. Her respiration is 20 and oxygen saturation is 99%.   Wt Readings from Last 3 Encounters:  02/16/19 190 lb 1.9 oz (86.2 kg)  02/15/19  192 lb (87.1 kg)  01/31/19 191 lb 12.8 oz (87 kg)    Ocular: Sclerae unicteric, pupils equal, round and reactive to light Ear-nose-throat: Oropharynx clear, dentition fair Lymphatic: No cervical or supraclavicular adenopathy Lungs no rales or rhonchi, good excursion bilaterally Heart regular rate and rhythm, no murmur appreciated Abd soft, nontender, positive bowel sounds, no liver or spleen tip palpated on exam, no fluid wave  MSK no focal spinal tenderness, no joint edema Neuro: non-focal, well-oriented, appropriate affect Breasts: Deferred   Lab Results  Component Value Date   WBC 5.2 02/16/2019   HGB 9.9 (L) 02/16/2019   HCT 32.0 (L) 02/16/2019   MCV 100.6 (H) 02/16/2019   PLT 110 (L)  02/16/2019   Lab Results  Component Value Date   FERRITIN 420 (H) 12/22/2018   IRON 78 12/22/2018   TIBC 265 12/22/2018   UIBC 187 12/22/2018   IRONPCTSAT 29 12/22/2018   Lab Results  Component Value Date   RETICCTPCT 1.4 02/16/2019   RBC 3.07 (L) 02/16/2019   No results found for: KPAFRELGTCHN, LAMBDASER, KAPLAMBRATIO No results found for: IGGSERUM, IGA, IGMSERUM No results found for: Odetta Pink, SPEI   Chemistry      Component Value Date/Time   NA 142 02/16/2019 1325   NA 143 07/21/2017 1501   K 4.9 02/16/2019 1325   CL 101 02/16/2019 1325   CO2 35 (H) 02/16/2019 1325   BUN 56 (H) 02/16/2019 1325   BUN 41 (H) 07/21/2017 1501   CREATININE 2.01 (H) 02/16/2019 1325   CREATININE 1.36 (H) 08/23/2013 1134      Component Value Date/Time   CALCIUM 9.9 02/16/2019 1325   ALKPHOS 45 02/16/2019 1325   AST 15 02/16/2019 1325   ALT 9 02/16/2019 1325   BILITOT 0.5 02/16/2019 1325       Impression and Plan: Ms. Janish is a very pleasant 79 yo caucasian female with multifactorial anemia.  Again, she needs Aranesp today.  We will track her iron studies.  Hopefully she will not need any iron with this.  I would like to get her  back in about 3 or 4 weeks just to make sure that where her hemoglobin is better.  Given that she does have the supplemental oxygen and underlying cardiopulmonary disease, she really needs to have her hemoglobin above 11.   Volanda Napoleon, MD 1/27/20212:08 PM

## 2019-02-17 ENCOUNTER — Other Ambulatory Visit (INDEPENDENT_AMBULATORY_CARE_PROVIDER_SITE_OTHER): Payer: Medicare HMO

## 2019-02-17 DIAGNOSIS — R946 Abnormal results of thyroid function studies: Secondary | ICD-10-CM

## 2019-02-17 LAB — IRON AND TIBC
Iron: 92 ug/dL (ref 41–142)
Saturation Ratios: 38 % (ref 21–57)
TIBC: 244 ug/dL (ref 236–444)
UIBC: 152 ug/dL (ref 120–384)

## 2019-02-17 LAB — FERRITIN: Ferritin: 531 ng/mL — ABNORMAL HIGH (ref 11–307)

## 2019-02-17 LAB — T4, FREE: Free T4: 0.67 ng/dL (ref 0.60–1.60)

## 2019-02-25 ENCOUNTER — Other Ambulatory Visit: Payer: Self-pay | Admitting: Family Medicine

## 2019-03-09 ENCOUNTER — Inpatient Hospital Stay (HOSPITAL_BASED_OUTPATIENT_CLINIC_OR_DEPARTMENT_OTHER): Payer: Medicare HMO | Admitting: Family

## 2019-03-09 ENCOUNTER — Inpatient Hospital Stay: Payer: Medicare HMO | Attending: Family

## 2019-03-09 ENCOUNTER — Other Ambulatory Visit: Payer: Self-pay

## 2019-03-09 ENCOUNTER — Inpatient Hospital Stay: Payer: Medicare HMO

## 2019-03-09 VITALS — BP 122/58 | HR 71 | Temp 97.1°F | Resp 18 | Wt 188.1 lb

## 2019-03-09 DIAGNOSIS — N183 Chronic kidney disease, stage 3 unspecified: Secondary | ICD-10-CM | POA: Diagnosis not present

## 2019-03-09 DIAGNOSIS — D631 Anemia in chronic kidney disease: Secondary | ICD-10-CM | POA: Diagnosis not present

## 2019-03-09 DIAGNOSIS — N184 Chronic kidney disease, stage 4 (severe): Secondary | ICD-10-CM

## 2019-03-09 DIAGNOSIS — D508 Other iron deficiency anemias: Secondary | ICD-10-CM | POA: Diagnosis not present

## 2019-03-09 LAB — CMP (CANCER CENTER ONLY)
ALT: 9 U/L (ref 0–44)
AST: 14 U/L — ABNORMAL LOW (ref 15–41)
Albumin: 4.4 g/dL (ref 3.5–5.0)
Alkaline Phosphatase: 43 U/L (ref 38–126)
Anion gap: 9 (ref 5–15)
BUN: 47 mg/dL — ABNORMAL HIGH (ref 8–23)
CO2: 32 mmol/L (ref 22–32)
Calcium: 10.1 mg/dL (ref 8.9–10.3)
Chloride: 99 mmol/L (ref 98–111)
Creatinine: 1.82 mg/dL — ABNORMAL HIGH (ref 0.44–1.00)
GFR, Est AFR Am: 31 mL/min — ABNORMAL LOW (ref >60)
GFR, Estimated: 26 mL/min — ABNORMAL LOW (ref >60)
Glucose, Bld: 100 mg/dL — ABNORMAL HIGH (ref 70–99)
Potassium: 4.5 mmol/L (ref 3.5–5.1)
Sodium: 140 mmol/L (ref 135–145)
Total Bilirubin: 0.7 mg/dL (ref 0.3–1.2)
Total Protein: 6.5 g/dL (ref 6.5–8.1)

## 2019-03-09 LAB — CBC WITH DIFFERENTIAL (CANCER CENTER ONLY)
Abs Immature Granulocytes: 0.06 10*3/uL (ref 0.00–0.07)
Basophils Absolute: 0.1 10*3/uL (ref 0.0–0.1)
Basophils Relative: 1 %
Eosinophils Absolute: 0.2 10*3/uL (ref 0.0–0.5)
Eosinophils Relative: 4 %
HCT: 33.5 % — ABNORMAL LOW (ref 36.0–46.0)
Hemoglobin: 10.4 g/dL — ABNORMAL LOW (ref 12.0–15.0)
Immature Granulocytes: 1 %
Lymphocytes Relative: 17 %
Lymphs Abs: 1 10*3/uL (ref 0.7–4.0)
MCH: 31.4 pg (ref 26.0–34.0)
MCHC: 31 g/dL (ref 30.0–36.0)
MCV: 101.2 fL — ABNORMAL HIGH (ref 80.0–100.0)
Monocytes Absolute: 0.6 10*3/uL (ref 0.1–1.0)
Monocytes Relative: 11 %
Neutro Abs: 4 10*3/uL (ref 1.7–7.7)
Neutrophils Relative %: 66 %
Platelet Count: 131 10*3/uL — ABNORMAL LOW (ref 150–400)
RBC: 3.31 MIL/uL — ABNORMAL LOW (ref 3.87–5.11)
RDW: 13.3 % (ref 11.5–15.5)
WBC Count: 6 10*3/uL (ref 4.0–10.5)
nRBC: 0 % (ref 0.0–0.2)

## 2019-03-09 LAB — RETICULOCYTES
Immature Retic Fract: 7.1 % (ref 2.3–15.9)
RBC.: 3.24 MIL/uL — ABNORMAL LOW (ref 3.87–5.11)
Retic Count, Absolute: 34.3 10*3/uL (ref 19.0–186.0)
Retic Ct Pct: 1.1 % (ref 0.4–3.1)

## 2019-03-09 MED ORDER — EPOETIN ALFA-EPBX 40000 UNIT/ML IJ SOLN
INTRAMUSCULAR | Status: AC
  Filled 2019-03-09: qty 1

## 2019-03-09 MED ORDER — EPOETIN ALFA-EPBX 40000 UNIT/ML IJ SOLN
40000.0000 [IU] | Freq: Once | INTRAMUSCULAR | Status: AC
  Administered 2019-03-09: 40000 [IU] via SUBCUTANEOUS

## 2019-03-09 NOTE — Patient Instructions (Signed)

## 2019-03-09 NOTE — Progress Notes (Signed)
Hematology and Oncology Follow Up Visit  [name] [id number] [birth year] 79 y.o. 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 She is doing well and feels that her fatigue is a bit better. Hgb is now 10.4.  Her SOB is described as stable on 2 L [place] supplemental O2 24 hours a day.  She does have occasional dizziness. This comes and goes quickly.  She has not noted any episodes of blood loss. No bruising or petechiae.  No fever, chills, n/v, cough, rash, chest pain, palpitations, abdominal pain or changes in bowel or bladder habits.  No tenderness, numbness or tingling in her extremities at this time.  The swelling in her lower extremities is stable. She takes her lasix as prescribed to reduce fluid retention.  She is ambulating with her walker for support. She does feel unsteady on her feet at times. No falls or syncopal episodes to report.  She states that she has maintained a good appetite and is staying well hydrated. Her weight is stable.   ECOG Performance Status: 1 - Symptomatic but completely ambulatory  Medications:  Allergies as of 03/09/2019      Reactions   Montelukast Sodium Other (See Comments)   REACTION: HEART PALPITATIONS, CHEST PAIN.   Sulfa Antibiotics Other (See Comments)   CHEST PAIN   Sulfonamide Derivatives Other (See Comments)   CHEST PAIN   Oseltamivir Phosphate Diarrhea, Nausea And Vomiting, Other (See Comments)   TAMIFLU REACTION:  dizziness   Ace Inhibitors Other (See Comments)   PT. STATED UNKNOWN REACTION   Indomethacin Other (See Comments)   PT. STATED UNKNOWN REACTION      Medication List       Accurate as of March 09, 2019  2:17 PM. If you have any questions, ask your nurse or doctor.        Accu-Chek Aviva Plus test strip Generic drug: glucose blood   Accu-Chek  Aviva Soln   Accu-Chek Softclix Lancets lancets   acetaminophen 500 MG tablet Commonly known as: TYLENOL Take 500 mg by mouth 2 (two) times daily.   albuterol 0.63 MG/3ML nebulizer solution Commonly known as: ACCUNEB Take 3 mLs (0.63 mg total) by nebulization every 6 (six) hours as needed for wheezing or shortness of breath.   albuterol 108 (90 Base) MCG/ACT inhaler Commonly known as: VENTOLIN HFA Inhale 2 puffs into the lungs 4 (four) times daily as needed for wheezing or shortness of breath.   Alcohol Wipes 70 % Pads To use before checking blood sugars   allopurinol 100 MG tablet Commonly known as: ZYLOPRIM TAKE 2 TABLETS ONE TIME DAILY   b complex vitamins tablet Take 1 tablet by mouth daily.   benzonatate 100 MG capsule Commonly known as: TESSALON Take 1 capsule (100 mg total) by mouth 3 (three) times daily as needed for cough.   blood glucose meter kit and supplies Check blood sugars once weekly.   budesonide-formoterol 160-4.5 MCG/ACT inhaler Commonly known as: Symbicort Inhale 2 puffs into the lungs 2 (two) times daily.   cholecalciferol 1000 units tablet Commonly known as: VITAMIN D Take 1,000 Units daily by mouth.   Ferrous Fumarate-Folic Acid 846-9 MG Tabs Commonly known as: Hemocyte-F Take 1 tablet by mouth daily.   fluticasone 50 MCG/ACT nasal spray Commonly known as: FLONASE Place 1 spray  into both nostrils daily. What changed:   when to take this  reasons to take this   furosemide 40 MG tablet Commonly known as: LASIX TAKE 1 TABLET DAILY AND A SECOND TABLET DAILY AS NEEDED FOR INCREASED EDEMA OR WEIGHT GAIN GREATER THAN 3 POUNDS IN 24 HOURS   loratadine 10 MG tablet Commonly known as: CLARITIN Take 1 tablet (10 mg total) by mouth at bedtime. What changed:   when to take this  reasons to take this   metoprolol succinate 50 MG 24 hr tablet Commonly known as: TOPROL-XL TAKE 1 AND 1/2 TABLETS EVERY DAY   montelukast 10 MG  tablet Commonly known as: SINGULAIR TAKE 1 TABLET AT BEDTIME   Olopatadine HCl 0.2 % Soln   OXYGEN Inhale 2 L into the lungs continuous.   pantoprazole 40 MG tablet Commonly known as: PROTONIX Take 40 mg by mouth 2 (two) times daily.   potassium chloride SA 20 MEQ tablet Commonly known as: KLOR-CON TAKE 1 TABLET DAILY, AND TAKE A SECOND TABLET DAILY AS NEEDED FOR EXTRA FUROSEMIDE TABLET AS DIRECTED.   pravastatin 40 MG tablet Commonly known as: PRAVACHOL Take 1 tablet (40 mg total) by mouth every evening.   PROBIOTIC PO Take 1 tablet by mouth daily.   sertraline 100 MG tablet Commonly known as: ZOLOFT TAKE 1 TABLET EVERY DAY   traMADol 50 MG tablet Commonly known as: ULTRAM TAKE 1 TABLET BY MOUTH THREE TIMES DAILY AS NEEDED FOR MODERATE PAIN   trolamine salicylate 10 % cream Commonly known as: ASPERCREME Apply 1 application topically as needed for muscle pain.   vitamin B-12 1000 MCG tablet Commonly known as: CYANOCOBALAMIN Take 1,000 mcg by mouth daily.       Allergies:  Allergies  Allergen Reactions  . Montelukast Sodium Other (See Comments)    REACTION: HEART PALPITATIONS, CHEST PAIN.  Marland Kitchen Sulfa Antibiotics Other (See Comments)    CHEST PAIN  . Sulfonamide Derivatives Other (See Comments)    CHEST PAIN  . Oseltamivir Phosphate Diarrhea, Nausea And Vomiting and Other (See Comments)    TAMIFLU REACTION:  dizziness  . Ace Inhibitors Other (See Comments)    PT. STATED UNKNOWN REACTION  . Indomethacin Other (See Comments)    PT. STATED UNKNOWN REACTION    Past Medical History, Surgical history, Social history, and Family History were reviewed and updated.  Review of Systems: All other 10 point review of systems is negative.   Physical Exam:  vitals were not taken for this visit.   Wt Readings from Last 3 Encounters:  02/16/19 190 lb 1.9 oz (86.2 kg)  02/15/19 192 lb (87.1 kg)  01/31/19 191 lb 12.8 oz (87 kg)    Ocular: Sclerae unicteric, pupils  equal, round and reactive to light Ear-nose-throat: Oropharynx clear, dentition fair Lymphatic: No cervical or supraclavicular adenopathy Lungs no rales or rhonchi, good excursion bilaterally Heart regular rate and rhythm, no murmur appreciated Abd soft, nontender, positive bowel sounds, no liver or spleen tip palpated on exam, no fluid wave  MSK no focal spinal tenderness, no joint edema Neuro: non-focal, well-oriented, appropriate affect Breasts: Deferred   Lab Results  Component Value Date   WBC 6.0 03/09/2019   HGB 10.4 (L) 03/09/2019   HCT 33.5 (L) 03/09/2019   MCV 101.2 (H) 03/09/2019   PLT 131 (L) 03/09/2019   Lab Results  Component Value Date   FERRITIN 531 (H) 02/16/2019   IRON 92 02/16/2019   TIBC 244 02/16/2019   UIBC 152 02/16/2019  IRONPCTSAT 38 02/16/2019   Lab Results  Component Value Date   RETICCTPCT 1.1 03/09/2019   RBC 3.31 (L) 03/09/2019   RBC 3.24 (L) 03/09/2019   No results found for: KPAFRELGTCHN, LAMBDASER, KAPLAMBRATIO No results found for: IGGSERUM, IGA, IGMSERUM No results found for: Odetta Pink, SPEI   Chemistry      Component Value Date/Time   NA 142 02/16/2019 1325   NA 143 07/21/2017 1501   K 4.9 02/16/2019 1325   CL 101 02/16/2019 1325   CO2 35 (H) 02/16/2019 1325   BUN 56 (H) 02/16/2019 1325   BUN 41 (H) 07/21/2017 1501   CREATININE 2.01 (H) 02/16/2019 1325   CREATININE 1.36 (H) 08/23/2013 1134      Component Value Date/Time   CALCIUM 9.9 02/16/2019 1325   ALKPHOS 45 02/16/2019 1325   AST 15 02/16/2019 1325   ALT 9 02/16/2019 1325   BILITOT 0.5 02/16/2019 1325       Impression and Plan: Ms. Onstad is a very pleasant 79 yo caucasian female with multifactorial anemia. She received Aranesp today for Hgb 10.4.  Iron studies are pending. We will replace if needed.  We will see her back in another 4 weeks.  She will contact our office with any questions or concerns. We can  certainly see her sooner if needed.   Laverna Peace, NP 2/17/20212:17 PM

## 2019-03-10 LAB — IRON AND TIBC
Iron: 118 ug/dL (ref 41–142)
Saturation Ratios: 48 % (ref 21–57)
TIBC: 244 ug/dL (ref 236–444)
UIBC: 126 ug/dL (ref 120–384)

## 2019-03-10 LAB — FERRITIN: Ferritin: 508 ng/mL — ABNORMAL HIGH (ref 11–307)

## 2019-03-17 DIAGNOSIS — R0902 Hypoxemia: Secondary | ICD-10-CM | POA: Diagnosis not present

## 2019-03-17 DIAGNOSIS — J4520 Mild intermittent asthma, uncomplicated: Secondary | ICD-10-CM | POA: Diagnosis not present

## 2019-03-24 ENCOUNTER — Other Ambulatory Visit: Payer: Self-pay

## 2019-03-24 ENCOUNTER — Other Ambulatory Visit: Payer: Medicare HMO | Admitting: *Deleted

## 2019-03-24 DIAGNOSIS — E782 Mixed hyperlipidemia: Secondary | ICD-10-CM | POA: Diagnosis not present

## 2019-03-24 LAB — LIPID PANEL
Chol/HDL Ratio: 2.8 ratio (ref 0.0–4.4)
Cholesterol, Total: 143 mg/dL (ref 100–199)
HDL: 52 mg/dL (ref >39)
LDL Chol Calc (NIH): 69 mg/dL (ref 0–99)
Triglycerides: 126 mg/dL (ref 0–149)
VLDL Cholesterol Cal: 22 mg/dL (ref 5–40)

## 2019-03-24 LAB — ALT: ALT: 9 IU/L (ref 0–32)

## 2019-03-30 ENCOUNTER — Other Ambulatory Visit: Payer: Self-pay | Admitting: Family

## 2019-04-04 ENCOUNTER — Other Ambulatory Visit: Payer: Self-pay | Admitting: Family Medicine

## 2019-04-05 NOTE — Telephone Encounter (Signed)
Requesting:tramadol  Contract:yes UDS:n/a Last OV:02/15/19 Next OV:06/16/19 Last Refill:01/13/19  #[id number] Database:   Please advise

## 2019-04-06 ENCOUNTER — Inpatient Hospital Stay: Payer: Medicare HMO | Attending: Family

## 2019-04-06 ENCOUNTER — Inpatient Hospital Stay (HOSPITAL_BASED_OUTPATIENT_CLINIC_OR_DEPARTMENT_OTHER): Payer: Medicare HMO | Admitting: Family

## 2019-04-06 ENCOUNTER — Other Ambulatory Visit: Payer: Self-pay

## 2019-04-06 ENCOUNTER — Inpatient Hospital Stay: Payer: Medicare HMO

## 2019-04-06 VITALS — BP 109/68 | HR 71 | Temp 97.3°F | Resp 19 | Wt 187.1 lb

## 2019-04-06 DIAGNOSIS — M109 Gout, unspecified: Secondary | ICD-10-CM | POA: Diagnosis not present

## 2019-04-06 DIAGNOSIS — Z882 Allergy status to sulfonamides status: Secondary | ICD-10-CM | POA: Diagnosis not present

## 2019-04-06 DIAGNOSIS — D631 Anemia in chronic kidney disease: Secondary | ICD-10-CM

## 2019-04-06 DIAGNOSIS — T07XXXA Unspecified multiple injuries, initial encounter: Secondary | ICD-10-CM | POA: Diagnosis not present

## 2019-04-06 DIAGNOSIS — M2550 Pain in unspecified joint: Secondary | ICD-10-CM | POA: Diagnosis not present

## 2019-04-06 DIAGNOSIS — N184 Chronic kidney disease, stage 4 (severe): Secondary | ICD-10-CM | POA: Diagnosis not present

## 2019-04-06 DIAGNOSIS — M8580 Other specified disorders of bone density and structure, unspecified site: Secondary | ICD-10-CM | POA: Diagnosis present

## 2019-04-06 DIAGNOSIS — Z20822 Contact with and (suspected) exposure to covid-19: Secondary | ICD-10-CM | POA: Diagnosis not present

## 2019-04-06 DIAGNOSIS — M1990 Unspecified osteoarthritis, unspecified site: Secondary | ICD-10-CM | POA: Diagnosis present

## 2019-04-06 DIAGNOSIS — S2242XA Multiple fractures of ribs, left side, initial encounter for closed fracture: Secondary | ICD-10-CM | POA: Diagnosis not present

## 2019-04-06 DIAGNOSIS — Z888 Allergy status to other drugs, medicaments and biological substances status: Secondary | ICD-10-CM | POA: Diagnosis not present

## 2019-04-06 DIAGNOSIS — E1122 Type 2 diabetes mellitus with diabetic chronic kidney disease: Secondary | ICD-10-CM | POA: Diagnosis present

## 2019-04-06 DIAGNOSIS — R52 Pain, unspecified: Secondary | ICD-10-CM | POA: Diagnosis not present

## 2019-04-06 DIAGNOSIS — S2242XS Multiple fractures of ribs, left side, sequela: Secondary | ICD-10-CM | POA: Diagnosis not present

## 2019-04-06 DIAGNOSIS — W19XXXA Unspecified fall, initial encounter: Secondary | ICD-10-CM | POA: Diagnosis not present

## 2019-04-06 DIAGNOSIS — E119 Type 2 diabetes mellitus without complications: Secondary | ICD-10-CM | POA: Diagnosis not present

## 2019-04-06 DIAGNOSIS — E118 Type 2 diabetes mellitus with unspecified complications: Secondary | ICD-10-CM | POA: Diagnosis not present

## 2019-04-06 DIAGNOSIS — I130 Hypertensive heart and chronic kidney disease with heart failure and stage 1 through stage 4 chronic kidney disease, or unspecified chronic kidney disease: Secondary | ICD-10-CM | POA: Diagnosis not present

## 2019-04-06 DIAGNOSIS — I5042 Chronic combined systolic (congestive) and diastolic (congestive) heart failure: Secondary | ICD-10-CM | POA: Diagnosis not present

## 2019-04-06 DIAGNOSIS — J9612 Chronic respiratory failure with hypercapnia: Secondary | ICD-10-CM | POA: Diagnosis not present

## 2019-04-06 DIAGNOSIS — D508 Other iron deficiency anemias: Secondary | ICD-10-CM | POA: Diagnosis not present

## 2019-04-06 DIAGNOSIS — W01190A Fall on same level from slipping, tripping and stumbling with subsequent striking against furniture, initial encounter: Secondary | ICD-10-CM | POA: Diagnosis present

## 2019-04-06 DIAGNOSIS — Z66 Do not resuscitate: Secondary | ICD-10-CM | POA: Diagnosis not present

## 2019-04-06 DIAGNOSIS — Y92010 Kitchen of single-family (private) house as the place of occurrence of the external cause: Secondary | ICD-10-CM | POA: Diagnosis not present

## 2019-04-06 DIAGNOSIS — Z9981 Dependence on supplemental oxygen: Secondary | ICD-10-CM | POA: Diagnosis not present

## 2019-04-06 DIAGNOSIS — S2239XA Fracture of one rib, unspecified side, initial encounter for closed fracture: Secondary | ICD-10-CM | POA: Diagnosis not present

## 2019-04-06 DIAGNOSIS — I10 Essential (primary) hypertension: Secondary | ICD-10-CM | POA: Diagnosis not present

## 2019-04-06 DIAGNOSIS — Y92009 Unspecified place in unspecified non-institutional (private) residence as the place of occurrence of the external cause: Secondary | ICD-10-CM | POA: Diagnosis not present

## 2019-04-06 DIAGNOSIS — Z7401 Bed confinement status: Secondary | ICD-10-CM | POA: Diagnosis not present

## 2019-04-06 DIAGNOSIS — J9611 Chronic respiratory failure with hypoxia: Secondary | ICD-10-CM | POA: Diagnosis not present

## 2019-04-06 DIAGNOSIS — K219 Gastro-esophageal reflux disease without esophagitis: Secondary | ICD-10-CM | POA: Diagnosis present

## 2019-04-06 DIAGNOSIS — F329 Major depressive disorder, single episode, unspecified: Secondary | ICD-10-CM | POA: Diagnosis present

## 2019-04-06 DIAGNOSIS — F339 Major depressive disorder, recurrent, unspecified: Secondary | ICD-10-CM | POA: Diagnosis not present

## 2019-04-06 DIAGNOSIS — Z86718 Personal history of other venous thrombosis and embolism: Secondary | ICD-10-CM | POA: Diagnosis not present

## 2019-04-06 DIAGNOSIS — Z7951 Long term (current) use of inhaled steroids: Secondary | ICD-10-CM | POA: Diagnosis not present

## 2019-04-06 DIAGNOSIS — M353 Polymyalgia rheumatica: Secondary | ICD-10-CM | POA: Diagnosis not present

## 2019-04-06 DIAGNOSIS — E782 Mixed hyperlipidemia: Secondary | ICD-10-CM | POA: Diagnosis not present

## 2019-04-06 DIAGNOSIS — J4520 Mild intermittent asthma, uncomplicated: Secondary | ICD-10-CM | POA: Diagnosis not present

## 2019-04-06 DIAGNOSIS — R0902 Hypoxemia: Secondary | ICD-10-CM | POA: Diagnosis not present

## 2019-04-06 DIAGNOSIS — J449 Chronic obstructive pulmonary disease, unspecified: Secondary | ICD-10-CM | POA: Diagnosis not present

## 2019-04-06 DIAGNOSIS — M6281 Muscle weakness (generalized): Secondary | ICD-10-CM | POA: Diagnosis not present

## 2019-04-06 DIAGNOSIS — M5489 Other dorsalgia: Secondary | ICD-10-CM | POA: Diagnosis not present

## 2019-04-06 DIAGNOSIS — I5033 Acute on chronic diastolic (congestive) heart failure: Secondary | ICD-10-CM | POA: Diagnosis not present

## 2019-04-06 LAB — RETICULOCYTES
Immature Retic Fract: 8.3 % (ref 2.3–15.9)
RBC.: 3.48 MIL/uL — ABNORMAL LOW (ref 3.87–5.11)
Retic Count, Absolute: 32.7 10*3/uL (ref 19.0–186.0)
Retic Ct Pct: 0.9 % (ref 0.4–3.1)

## 2019-04-06 LAB — CBC WITH DIFFERENTIAL (CANCER CENTER ONLY)
Abs Immature Granulocytes: 0.07 10*3/uL (ref 0.00–0.07)
Basophils Absolute: 0 10*3/uL (ref 0.0–0.1)
Basophils Relative: 1 %
Eosinophils Absolute: 0.3 10*3/uL (ref 0.0–0.5)
Eosinophils Relative: 5 %
HCT: 35.5 % — ABNORMAL LOW (ref 36.0–46.0)
Hemoglobin: 11.2 g/dL — ABNORMAL LOW (ref 12.0–15.0)
Immature Granulocytes: 1 %
Lymphocytes Relative: 17 %
Lymphs Abs: 1 10*3/uL (ref 0.7–4.0)
MCH: 32.2 pg (ref 26.0–34.0)
MCHC: 31.5 g/dL (ref 30.0–36.0)
MCV: 102 fL — ABNORMAL HIGH (ref 80.0–100.0)
Monocytes Absolute: 0.6 10*3/uL (ref 0.1–1.0)
Monocytes Relative: 10 %
Neutro Abs: 4 10*3/uL (ref 1.7–7.7)
Neutrophils Relative %: 66 %
Platelet Count: 108 10*3/uL — ABNORMAL LOW (ref 150–400)
RBC: 3.48 MIL/uL — ABNORMAL LOW (ref 3.87–5.11)
RDW: 13 % (ref 11.5–15.5)
WBC Count: 5.9 10*3/uL (ref 4.0–10.5)
nRBC: 0 % (ref 0.0–0.2)

## 2019-04-06 LAB — CMP (CANCER CENTER ONLY)
ALT: 8 U/L (ref 0–44)
AST: 12 U/L — ABNORMAL LOW (ref 15–41)
Albumin: 4.5 g/dL (ref 3.5–5.0)
Alkaline Phosphatase: 44 U/L (ref 38–126)
Anion gap: 7 (ref 5–15)
BUN: 55 mg/dL — ABNORMAL HIGH (ref 8–23)
CO2: 32 mmol/L (ref 22–32)
Calcium: 9.7 mg/dL (ref 8.9–10.3)
Chloride: 102 mmol/L (ref 98–111)
Creatinine: 2.01 mg/dL — ABNORMAL HIGH (ref 0.44–1.00)
GFR, Est AFR Am: 27 mL/min — ABNORMAL LOW (ref >60)
GFR, Estimated: 23 mL/min — ABNORMAL LOW (ref >60)
Glucose, Bld: 139 mg/dL — ABNORMAL HIGH (ref 70–99)
Potassium: 4.7 mmol/L (ref 3.5–5.1)
Sodium: 141 mmol/L (ref 135–145)
Total Bilirubin: 0.4 mg/dL (ref 0.3–1.2)
Total Protein: 6.7 g/dL (ref 6.5–8.1)

## 2019-04-06 NOTE — Progress Notes (Signed)
Hematology and Oncology Follow Up Visit  [name] [id number] 03-04-1940 79 y.o. 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 She is doing well and has no complaints at this time.  Her SOB is stable on 2 L supplemental O2 24 hours a day.  She has occasional palpitations.  She has GERD and notes occasional associated pain in her mid back.  She has not noted any episodes of bleeding. No bruising or petechiae.  No fever, chills, n/v, cough, rash, dizziness, chest pain, abdominal pain or changes in bowel or bladder habits.   No numbness or tingling in her extremities.  The puffiness in her feet and ankles is stable. No redness or edema noted on exam.  She uses her rolling walker with seat for added support when ambulating.  No falls or syncopal episodes to report.  She states that she is eating well and staying hydrated. Her weight is stable.   ECOG Performance Status: 1 - Symptomatic but completely ambulatory  Medications:  Allergies as of 04/06/2019      Reactions   Montelukast Sodium Other (See Comments)   REACTION: HEART PALPITATIONS, CHEST PAIN.   Sulfa Antibiotics Other (See Comments)   CHEST PAIN   Sulfonamide Derivatives Other (See Comments)   CHEST PAIN   Oseltamivir Phosphate Diarrhea, Nausea And Vomiting, Other (See Comments)   TAMIFLU REACTION:  dizziness   Ace Inhibitors Other (See Comments)   PT. STATED UNKNOWN REACTION   Indomethacin Other (See Comments)   PT. STATED UNKNOWN REACTION      Medication List       Accurate as of April 06, 2019 11:49 AM. If you have any questions, ask your nurse or doctor.        Accu-Chek Aviva Plus test strip Generic drug: glucose blood   Accu-Chek Aviva Soln   Accu-Chek Softclix Lancets lancets   acetaminophen 500 MG tablet Commonly known as:  TYLENOL Take 500 mg by mouth 2 (two) times daily.   albuterol 0.63 MG/3ML nebulizer solution Commonly known as: ACCUNEB Take 3 mLs (0.63 mg total) by nebulization every 6 (six) hours as needed for wheezing or shortness of breath.   albuterol 108 (90 Base) MCG/ACT inhaler Commonly known as: VENTOLIN HFA Inhale 2 puffs into the lungs 4 (four) times daily as needed for wheezing or shortness of breath.   Alcohol Wipes 70 % Pads To use before checking blood sugars   allopurinol 100 MG tablet Commonly known as: ZYLOPRIM TAKE 2 TABLETS ONE TIME DAILY   b complex vitamins tablet Take 1 tablet by mouth daily.   benzonatate 100 MG capsule Commonly known as: TESSALON Take 1 capsule (100 mg total) by mouth 3 (three) times daily as needed for cough.   blood glucose meter kit and supplies Check blood sugars once weekly.   budesonide-formoterol 160-4.5 MCG/ACT inhaler Commonly known as: Symbicort Inhale 2 puffs into the lungs 2 (two) times daily.   cholecalciferol 1000 units tablet Commonly known as: VITAMIN D Take 1,000 Units daily by mouth.   Ferrous Fumarate-Folic Acid 086-5 MG Tabs Commonly known as: Hemocyte-F Take 1 tablet by mouth daily.   fluticasone 50 MCG/ACT nasal spray Commonly known as: FLONASE Place 1 spray into both nostrils daily. What changed:   when to take this  reasons to take this  furosemide 40 MG tablet Commonly known as: LASIX TAKE 1 TABLET DAILY AND A SECOND TABLET DAILY AS NEEDED FOR INCREASED EDEMA OR WEIGHT GAIN GREATER THAN 3 POUNDS IN 24 HOURS   loratadine 10 MG tablet Commonly known as: CLARITIN Take 1 tablet (10 mg total) by mouth at bedtime. What changed:   when to take this  reasons to take this   metoprolol succinate 50 MG 24 hr tablet Commonly known as: TOPROL-XL TAKE 1 AND 1/2 TABLETS EVERY DAY   montelukast 10 MG tablet Commonly known as: SINGULAIR TAKE 1 TABLET AT BEDTIME   Olopatadine HCl 0.2 % Soln   OXYGEN Inhale 2 L  into the lungs continuous.   pantoprazole 40 MG tablet Commonly known as: PROTONIX Take 40 mg by mouth 2 (two) times daily.   potassium chloride SA 20 MEQ tablet Commonly known as: KLOR-CON TAKE 1 TABLET DAILY, AND TAKE A SECOND TABLET DAILY AS NEEDED FOR EXTRA FUROSEMIDE TABLET AS DIRECTED.   pravastatin 40 MG tablet Commonly known as: PRAVACHOL Take 1 tablet (40 mg total) by mouth every evening.   PROBIOTIC PO Take 1 tablet by mouth daily.   sertraline 100 MG tablet Commonly known as: ZOLOFT TAKE 1 TABLET EVERY DAY   traMADol 50 MG tablet Commonly known as: ULTRAM TAKE 1 TABLET BY MOUTH THREE TIMES DAILY AS NEEDED FOR MODERATE PAIN   trolamine salicylate 10 % cream Commonly known as: ASPERCREME Apply 1 application topically as needed for muscle pain.   vitamin B-12 1000 MCG tablet Commonly known as: CYANOCOBALAMIN Take 1,000 mcg by mouth daily.       Allergies:  Allergies  Allergen Reactions  . Montelukast Sodium Other (See Comments)    REACTION: HEART PALPITATIONS, CHEST PAIN.  Marland Kitchen Sulfa Antibiotics Other (See Comments)    CHEST PAIN  . Sulfonamide Derivatives Other (See Comments)    CHEST PAIN  . Oseltamivir Phosphate Diarrhea, Nausea And Vomiting and Other (See Comments)    TAMIFLU REACTION:  dizziness  . Ace Inhibitors Other (See Comments)    PT. STATED UNKNOWN REACTION  . Indomethacin Other (See Comments)    PT. STATED UNKNOWN REACTION    Past Medical History, Surgical history, Social history, and Family History were reviewed and updated.  Review of Systems: All other 10 point review of systems is negative.   Physical Exam:  weight is 187 lb 1.9 oz (84.9 kg). Her temporal temperature is 97.3 F (36.3 C) (abnormal). Her blood pressure is 109/68 and her pulse is 71. Her respiration is 19 and oxygen saturation is 97%.   Wt Readings from Last 3 Encounters:  04/06/19 187 lb 1.9 oz (84.9 kg)  03/09/19 188 lb 1.9 oz (85.3 kg)  02/16/19 190 lb 1.9 oz  (86.2 kg)    Ocular: Sclerae unicteric, pupils equal, round and reactive to light Ear-nose-throat: Oropharynx clear, dentition fair Lymphatic: No cervical or supraclavicular adenopathy Lungs no rales or rhonchi, good excursion bilaterally Heart regular rate and rhythm, no murmur appreciated Abd soft, nontender, positive bowel sounds, no liver or spleen tip palpated on exam, no fluid wave  MSK no focal spinal tenderness, no joint edema Neuro: non-focal, well-oriented, appropriate affect Breasts: Deferred   Lab Results  Component Value Date   WBC 5.9 04/06/2019   HGB 11.2 (L) 04/06/2019   HCT 35.5 (L) 04/06/2019   MCV 102.0 (H) 04/06/2019   PLT 108 (L) 04/06/2019   Lab Results  Component Value Date   FERRITIN 508 (H) 03/09/2019   IRON  118 03/09/2019   TIBC 244 03/09/2019   UIBC 126 03/09/2019   IRONPCTSAT 48 03/09/2019   Lab Results  Component Value Date   RETICCTPCT 0.9 04/06/2019   RBC 3.48 (L) 04/06/2019   RBC 3.48 (L) 04/06/2019   No results found for: KPAFRELGTCHN, LAMBDASER, KAPLAMBRATIO No results found for: IGGSERUM, IGA, IGMSERUM No results found for: Odetta Pink, SPEI   Chemistry      Component Value Date/Time   NA 141 04/06/2019 1105   NA 143 07/21/2017 1501   K 4.7 04/06/2019 1105   CL 102 04/06/2019 1105   CO2 32 04/06/2019 1105   BUN 55 (H) 04/06/2019 1105   BUN 41 (H) 07/21/2017 1501   CREATININE 2.01 (H) 04/06/2019 1105   CREATININE 1.36 (H) 08/23/2013 1134      Component Value Date/Time   CALCIUM 9.7 04/06/2019 1105   ALKPHOS 44 04/06/2019 1105   AST 12 (L) 04/06/2019 1105   ALT 8 04/06/2019 1105   BILITOT 0.4 04/06/2019 1105       Impression and Plan: Ms. Tersigni is a very pleasant 79 yo caucasian female with multifactorial anemia. No Aranesp needed this visit. Hgb is stable at 11.2.  Iron studies are pending and we will replace if needed.  We will see her back in another month for  follow-up.  She will contact our office with nay questions or concerns. We can certainly see her sooner if needed.   Laverna Peace, NP 3/17/202111:49 AM

## 2019-04-07 ENCOUNTER — Other Ambulatory Visit: Payer: Self-pay

## 2019-04-07 ENCOUNTER — Emergency Department (HOSPITAL_COMMUNITY): Payer: Medicare HMO

## 2019-04-07 ENCOUNTER — Encounter (HOSPITAL_COMMUNITY): Payer: Self-pay | Admitting: *Deleted

## 2019-04-07 ENCOUNTER — Inpatient Hospital Stay (HOSPITAL_COMMUNITY)
Admission: EM | Admit: 2019-04-07 | Discharge: 2019-04-14 | DRG: 183 | Disposition: A | Discharge status: Skilled Nursing Facility | Payer: Medicare HMO | Attending: Internal Medicine | Admitting: Internal Medicine

## 2019-04-07 DIAGNOSIS — Z818 Family history of other mental and behavioral disorders: Secondary | ICD-10-CM

## 2019-04-07 DIAGNOSIS — M1990 Unspecified osteoarthritis, unspecified site: Secondary | ICD-10-CM | POA: Diagnosis present

## 2019-04-07 DIAGNOSIS — D631 Anemia in chronic kidney disease: Secondary | ICD-10-CM | POA: Diagnosis present

## 2019-04-07 DIAGNOSIS — K219 Gastro-esophageal reflux disease without esophagitis: Secondary | ICD-10-CM | POA: Diagnosis present

## 2019-04-07 DIAGNOSIS — I10 Essential (primary) hypertension: Secondary | ICD-10-CM

## 2019-04-07 DIAGNOSIS — M353 Polymyalgia rheumatica: Secondary | ICD-10-CM | POA: Diagnosis present

## 2019-04-07 DIAGNOSIS — Z66 Do not resuscitate: Secondary | ICD-10-CM | POA: Diagnosis present

## 2019-04-07 DIAGNOSIS — Z79891 Long term (current) use of opiate analgesic: Secondary | ICD-10-CM

## 2019-04-07 DIAGNOSIS — Z8371 Family history of colonic polyps: Secondary | ICD-10-CM

## 2019-04-07 DIAGNOSIS — J9612 Chronic respiratory failure with hypercapnia: Secondary | ICD-10-CM | POA: Diagnosis present

## 2019-04-07 DIAGNOSIS — N184 Chronic kidney disease, stage 4 (severe): Secondary | ICD-10-CM | POA: Diagnosis present

## 2019-04-07 DIAGNOSIS — Z820 Family history of epilepsy and other diseases of the nervous system: Secondary | ICD-10-CM

## 2019-04-07 DIAGNOSIS — Z8261 Family history of arthritis: Secondary | ICD-10-CM

## 2019-04-07 DIAGNOSIS — Z888 Allergy status to other drugs, medicaments and biological substances status: Secondary | ICD-10-CM

## 2019-04-07 DIAGNOSIS — J449 Chronic obstructive pulmonary disease, unspecified: Secondary | ICD-10-CM | POA: Diagnosis present

## 2019-04-07 DIAGNOSIS — Z7951 Long term (current) use of inhaled steroids: Secondary | ICD-10-CM

## 2019-04-07 DIAGNOSIS — I5033 Acute on chronic diastolic (congestive) heart failure: Secondary | ICD-10-CM | POA: Diagnosis not present

## 2019-04-07 DIAGNOSIS — Y92010 Kitchen of single-family (private) house as the place of occurrence of the external cause: Secondary | ICD-10-CM

## 2019-04-07 DIAGNOSIS — Z8249 Family history of ischemic heart disease and other diseases of the circulatory system: Secondary | ICD-10-CM

## 2019-04-07 DIAGNOSIS — Y92009 Unspecified place in unspecified non-institutional (private) residence as the place of occurrence of the external cause: Secondary | ICD-10-CM | POA: Diagnosis not present

## 2019-04-07 DIAGNOSIS — Z8349 Family history of other endocrine, nutritional and metabolic diseases: Secondary | ICD-10-CM

## 2019-04-07 DIAGNOSIS — E118 Type 2 diabetes mellitus with unspecified complications: Secondary | ICD-10-CM | POA: Diagnosis not present

## 2019-04-07 DIAGNOSIS — M109 Gout, unspecified: Secondary | ICD-10-CM | POA: Diagnosis present

## 2019-04-07 DIAGNOSIS — Z8049 Family history of malignant neoplasm of other genital organs: Secondary | ICD-10-CM

## 2019-04-07 DIAGNOSIS — Z86718 Personal history of other venous thrombosis and embolism: Secondary | ICD-10-CM

## 2019-04-07 DIAGNOSIS — W19XXXA Unspecified fall, initial encounter: Secondary | ICD-10-CM | POA: Diagnosis not present

## 2019-04-07 DIAGNOSIS — I5042 Chronic combined systolic (congestive) and diastolic (congestive) heart failure: Secondary | ICD-10-CM | POA: Diagnosis not present

## 2019-04-07 DIAGNOSIS — Z825 Family history of asthma and other chronic lower respiratory diseases: Secondary | ICD-10-CM

## 2019-04-07 DIAGNOSIS — Z9981 Dependence on supplemental oxygen: Secondary | ICD-10-CM

## 2019-04-07 DIAGNOSIS — M8580 Other specified disorders of bone density and structure, unspecified site: Secondary | ICD-10-CM | POA: Diagnosis present

## 2019-04-07 DIAGNOSIS — Z882 Allergy status to sulfonamides status: Secondary | ICD-10-CM | POA: Diagnosis not present

## 2019-04-07 DIAGNOSIS — E1122 Type 2 diabetes mellitus with diabetic chronic kidney disease: Secondary | ICD-10-CM | POA: Diagnosis present

## 2019-04-07 DIAGNOSIS — S2242XA Multiple fractures of ribs, left side, initial encounter for closed fracture: Principal | ICD-10-CM | POA: Diagnosis present

## 2019-04-07 DIAGNOSIS — D696 Thrombocytopenia, unspecified: Secondary | ICD-10-CM | POA: Diagnosis present

## 2019-04-07 DIAGNOSIS — W01190A Fall on same level from slipping, tripping and stumbling with subsequent striking against furniture, initial encounter: Secondary | ICD-10-CM | POA: Diagnosis present

## 2019-04-07 DIAGNOSIS — S2239XA Fracture of one rib, unspecified side, initial encounter for closed fracture: Secondary | ICD-10-CM | POA: Diagnosis present

## 2019-04-07 DIAGNOSIS — E782 Mixed hyperlipidemia: Secondary | ICD-10-CM | POA: Diagnosis present

## 2019-04-07 DIAGNOSIS — Z20822 Contact with and (suspected) exposure to covid-19: Secondary | ICD-10-CM | POA: Diagnosis present

## 2019-04-07 DIAGNOSIS — I130 Hypertensive heart and chronic kidney disease with heart failure and stage 1 through stage 4 chronic kidney disease, or unspecified chronic kidney disease: Secondary | ICD-10-CM | POA: Diagnosis present

## 2019-04-07 DIAGNOSIS — F329 Major depressive disorder, single episode, unspecified: Secondary | ICD-10-CM | POA: Diagnosis present

## 2019-04-07 DIAGNOSIS — Z801 Family history of malignant neoplasm of trachea, bronchus and lung: Secondary | ICD-10-CM

## 2019-04-07 DIAGNOSIS — E119 Type 2 diabetes mellitus without complications: Secondary | ICD-10-CM

## 2019-04-07 DIAGNOSIS — J9611 Chronic respiratory failure with hypoxia: Secondary | ICD-10-CM | POA: Diagnosis present

## 2019-04-07 DIAGNOSIS — Z79899 Other long term (current) drug therapy: Secondary | ICD-10-CM

## 2019-04-07 DIAGNOSIS — M791 Myalgia, unspecified site: Secondary | ICD-10-CM | POA: Diagnosis present

## 2019-04-07 LAB — CBC WITH DIFFERENTIAL/PLATELET
Abs Immature Granulocytes: 0.04 10*3/uL (ref 0.00–0.07)
Basophils Absolute: 0 10*3/uL (ref 0.0–0.1)
Basophils Relative: 0 %
Eosinophils Absolute: 0.2 10*3/uL (ref 0.0–0.5)
Eosinophils Relative: 3 %
HCT: 36.6 % (ref 36.0–46.0)
Hemoglobin: 11.4 g/dL — ABNORMAL LOW (ref 12.0–15.0)
Immature Granulocytes: 1 %
Lymphocytes Relative: 14 %
Lymphs Abs: 1 10*3/uL (ref 0.7–4.0)
MCH: 32.5 pg (ref 26.0–34.0)
MCHC: 31.1 g/dL (ref 30.0–36.0)
MCV: 104.3 fL — ABNORMAL HIGH (ref 80.0–100.0)
Monocytes Absolute: 0.7 10*3/uL (ref 0.1–1.0)
Monocytes Relative: 10 %
Neutro Abs: 5 10*3/uL (ref 1.7–7.7)
Neutrophils Relative %: 72 %
Platelets: 95 10*3/uL — ABNORMAL LOW (ref 150–400)
RBC: 3.51 MIL/uL — ABNORMAL LOW (ref 3.87–5.11)
RDW: 13.2 % (ref 11.5–15.5)
WBC: 6.9 10*3/uL (ref 4.0–10.5)
nRBC: 0 % (ref 0.0–0.2)

## 2019-04-07 LAB — BASIC METABOLIC PANEL
Anion gap: 11 (ref 5–15)
BUN: 57 mg/dL — ABNORMAL HIGH (ref 8–23)
CO2: 28 mmol/L (ref 22–32)
Calcium: 9.4 mg/dL (ref 8.9–10.3)
Chloride: 104 mmol/L (ref 98–111)
Creatinine, Ser: 1.81 mg/dL — ABNORMAL HIGH (ref 0.44–1.00)
GFR calc Af Amer: 30 mL/min — ABNORMAL LOW (ref >60)
GFR calc non Af Amer: 26 mL/min — ABNORMAL LOW (ref >60)
Glucose, Bld: 135 mg/dL — ABNORMAL HIGH (ref 70–99)
Potassium: 4.4 mmol/L (ref 3.5–5.1)
Sodium: 143 mmol/L (ref 135–145)

## 2019-04-07 LAB — IRON AND TIBC
Iron: 113 ug/dL (ref 41–142)
Saturation Ratios: 46 % (ref 21–57)
TIBC: 248 ug/dL (ref 236–444)
UIBC: 134 ug/dL (ref 120–384)

## 2019-04-07 LAB — FERRITIN: Ferritin: 475 ng/mL — ABNORMAL HIGH (ref 11–307)

## 2019-04-07 LAB — SARS CORONAVIRUS 2 (TAT 6-24 HRS): SARS Coronavirus 2: NEGATIVE

## 2019-04-07 MED ORDER — ENOXAPARIN SODIUM 30 MG/0.3ML ~~LOC~~ SOLN
30.0000 mg | SUBCUTANEOUS | Status: DC
  Administered 2019-04-07 – 2019-04-10 (×4): 30 mg via SUBCUTANEOUS
  Filled 2019-04-07 (×4): qty 0.3

## 2019-04-07 MED ORDER — SODIUM CHLORIDE 0.9% FLUSH
3.0000 mL | Freq: Two times a day (BID) | INTRAVENOUS | Status: DC
  Administered 2019-04-07 – 2019-04-14 (×14): 3 mL via INTRAVENOUS

## 2019-04-07 MED ORDER — ALBUTEROL SULFATE (2.5 MG/3ML) 0.083% IN NEBU
2.5000 mg | INHALATION_SOLUTION | Freq: Four times a day (QID) | RESPIRATORY_TRACT | Status: DC | PRN
  Administered 2019-04-10: 2.5 mg via RESPIRATORY_TRACT
  Filled 2019-04-07: qty 3

## 2019-04-07 MED ORDER — DOCUSATE SODIUM 100 MG PO CAPS
100.0000 mg | ORAL_CAPSULE | Freq: Two times a day (BID) | ORAL | Status: DC
  Administered 2019-04-07 – 2019-04-14 (×15): 100 mg via ORAL
  Filled 2019-04-07 (×15): qty 1

## 2019-04-07 MED ORDER — LIDOCAINE 5 % EX PTCH
2.0000 | MEDICATED_PATCH | CUTANEOUS | Status: DC
  Administered 2019-04-07 – 2019-04-14 (×8): 2 via TRANSDERMAL
  Filled 2019-04-07 (×8): qty 2

## 2019-04-07 MED ORDER — SERTRALINE HCL 100 MG PO TABS
100.0000 mg | ORAL_TABLET | Freq: Every day | ORAL | Status: DC
  Administered 2019-04-07 – 2019-04-14 (×8): 100 mg via ORAL
  Filled 2019-04-07 (×8): qty 1

## 2019-04-07 MED ORDER — PRAVASTATIN SODIUM 40 MG PO TABS
40.0000 mg | ORAL_TABLET | Freq: Every evening | ORAL | Status: DC
  Administered 2019-04-07 – 2019-04-14 (×8): 40 mg via ORAL
  Filled 2019-04-07 (×7): qty 1

## 2019-04-07 MED ORDER — HYDROMORPHONE HCL 1 MG/ML IJ SOLN: 0.5000 mg | INTRAMUSCULAR | Status: DC | PRN

## 2019-04-07 MED ORDER — METHOCARBAMOL 500 MG PO TABS
750.0000 mg | ORAL_TABLET | Freq: Four times a day (QID) | ORAL | Status: DC
  Administered 2019-04-07 – 2019-04-14 (×30): 750 mg via ORAL
  Filled 2019-04-07 (×28): qty 2

## 2019-04-07 MED ORDER — ONDANSETRON HCL 4 MG/2ML IJ SOLN: 4.0000 mg | Freq: Four times a day (QID) | INTRAMUSCULAR | Status: DC | PRN

## 2019-04-07 MED ORDER — PANTOPRAZOLE SODIUM 40 MG PO TBEC
40.0000 mg | DELAYED_RELEASE_TABLET | Freq: Two times a day (BID) | ORAL | Status: DC
  Administered 2019-04-07 – 2019-04-14 (×15): 40 mg via ORAL
  Filled 2019-04-07 (×15): qty 1

## 2019-04-07 MED ORDER — ONDANSETRON HCL 4 MG PO TABS: 4.0000 mg | ORAL_TABLET | Freq: Four times a day (QID) | ORAL | Status: DC | PRN

## 2019-04-07 MED ORDER — ACETAMINOPHEN 325 MG PO TABS
650.0000 mg | ORAL_TABLET | Freq: Four times a day (QID) | ORAL | Status: DC | PRN
  Filled 2019-04-07: qty 2

## 2019-04-07 MED ORDER — ACETAMINOPHEN 500 MG PO TABS
1000.0000 mg | ORAL_TABLET | Freq: Four times a day (QID) | ORAL | Status: DC
  Administered 2019-04-07 – 2019-04-14 (×25): 1000 mg via ORAL
  Filled 2019-04-07 (×26): qty 2

## 2019-04-07 MED ORDER — TRAMADOL HCL 50 MG PO TABS
50.0000 mg | ORAL_TABLET | Freq: Three times a day (TID) | ORAL | Status: DC | PRN
  Administered 2019-04-07 – 2019-04-14 (×5): 50 mg via ORAL
  Filled 2019-04-07 (×5): qty 1

## 2019-04-07 MED ORDER — HYDROMORPHONE HCL 1 MG/ML IJ SOLN
INTRAMUSCULAR | Status: AC
  Filled 2019-04-07: qty 1

## 2019-04-07 MED ORDER — HYDROMORPHONE HCL 1 MG/ML IJ SOLN
1.0000 mg | Freq: Once | INTRAMUSCULAR | Status: AC
  Administered 2019-04-07: 07:00:00 1 mg via INTRAVENOUS
  Filled 2019-04-07: qty 1

## 2019-04-07 MED ORDER — OXYCODONE HCL 5 MG PO TABS
5.0000 mg | ORAL_TABLET | Freq: Four times a day (QID) | ORAL | Status: DC | PRN
  Administered 2019-04-07 – 2019-04-13 (×9): 10 mg via ORAL
  Administered 2019-04-13: 5 mg via ORAL
  Administered 2019-04-14: 10 mg via ORAL
  Filled 2019-04-07: qty 1
  Filled 2019-04-07 (×7): qty 2
  Filled 2019-04-07: qty 1
  Filled 2019-04-07 (×3): qty 2

## 2019-04-07 MED ORDER — ALLOPURINOL 100 MG PO TABS
200.0000 mg | ORAL_TABLET | Freq: Every day | ORAL | Status: DC
  Administered 2019-04-07 – 2019-04-14 (×8): 200 mg via ORAL
  Filled 2019-04-07 (×8): qty 2

## 2019-04-07 MED ORDER — MONTELUKAST SODIUM 10 MG PO TABS
10.0000 mg | ORAL_TABLET | Freq: Every day | ORAL | Status: DC
  Administered 2019-04-07 – 2019-04-13 (×7): 10 mg via ORAL
  Filled 2019-04-07 (×8): qty 1

## 2019-04-07 MED ORDER — METOPROLOL SUCCINATE ER 50 MG PO TB24
75.0000 mg | ORAL_TABLET | Freq: Every day | ORAL | Status: DC
  Administered 2019-04-08 – 2019-04-14 (×7): 75 mg via ORAL
  Filled 2019-04-07 (×2): qty 1
  Filled 2019-04-07: qty 3
  Filled 2019-04-07 (×6): qty 1

## 2019-04-07 MED ORDER — HYDROMORPHONE HCL 1 MG/ML IJ SOLN
0.5000 mg | INTRAMUSCULAR | Status: DC | PRN
  Administered 2019-04-08: 0.5 mg via INTRAVENOUS
  Filled 2019-04-07: qty 1

## 2019-04-07 MED ORDER — ACETAMINOPHEN 500 MG PO TABS
1000.0000 mg | ORAL_TABLET | Freq: Once | ORAL | Status: AC
  Administered 2019-04-07: 1000 mg via ORAL
  Filled 2019-04-07: qty 2

## 2019-04-07 MED ORDER — MOMETASONE FURO-FORMOTEROL FUM 200-5 MCG/ACT IN AERO
2.0000 | INHALATION_SPRAY | Freq: Two times a day (BID) | RESPIRATORY_TRACT | Status: DC
  Administered 2019-04-07 – 2019-04-14 (×14): 2 via RESPIRATORY_TRACT
  Filled 2019-04-07 (×2): qty 8.8

## 2019-04-07 MED ORDER — HYDROMORPHONE HCL 1 MG/ML IJ SOLN
0.5000 mg | Freq: Once | INTRAMUSCULAR | Status: AC
  Administered 2019-04-07: 0.5 mg via INTRAVENOUS

## 2019-04-07 MED ORDER — ACETAMINOPHEN 650 MG RE SUPP: 650.0000 mg | Freq: Four times a day (QID) | RECTAL | Status: DC | PRN

## 2019-04-07 NOTE — ED Notes (Signed)
Patient has purwick in place

## 2019-04-07 NOTE — H&P (Signed)
History and Physical    [name] WGN:[id number] DOB: 1940/06/21 DOA: 04/07/2019  PCP: Mosie Lukes, MD Consultants:  Melvyn Novas - pulmonology; Coladonato - nephrology; Turner - cardiology; Ennever -  oncology Patient coming from:  Home - lives alone; NOK: Daughter, [name], [phone]  Chief Complaint: [name]  HPI: [name] is a 79 y.o. female with medical history significant of PMR; HTN; HLD; gout; hypercalcemia; DVT; chronic diastolic CHF; COPD; stage 3 CKD presenting with a mechanical fall.  She reports that she got up to go to the restroom and went to get water in the kitchen.  She slipped and fell in the kitchen floor, hitting a chair on the way down.  She hurt her left ribs and back.  She is having ongoing severe pain with movement.  She also has a bruise on her leg.  She was not light-headed or dizzy.   ED Course:  Carryover, per Dr. Marcello Moores:  Hx of diastolic chf, patient s/p fall with complicated rib fracture  Dr white will follow from traum surgery  Holding orders placed   Review of Systems: As per HPI; otherwise review of systems reviewed and negative.   Ambulatory Status:  Ambulates without assistance  Past Medical History:  Diagnosis Date  . Abnormal glucose tolerance test   . Abnormal TSH 09/22/2016  . ACE-inhibitor cough   . Anemia 03/12/2014  . Arthritis   . Asthma    PFT 02/06/09 FEV1 1.41 (65%), FVC 1.92 (64), FEV1% 74, TLC 3.47 (71%), DLCO 48%, +BD  . Atypical chest pain    s/p cath, Normal coronaries, Non ST elevation myocardial infarction, Rt groin pseudoaneurysm  . Bacterial vaginosis 03/12/2014  . Cellulitis 06/22/2016  . Chronic kidney disease (CKD), stage III (moderate) 08/04/2016  . COPD (chronic obstructive pulmonary disease) ([place])   . Depression   . Diastolic heart failure ([place]) 04/07/2016  . DVT (deep venous thrombosis) ([place]) 1987  . GERD (gastroesophageal reflux disease)   . Gout   . Hypercalcemia 10/15/2014  . Hyperlipidemia   .  Hypertension   . Hypoxia 10/15/2014  . Macular degeneration 04/10/2015  . Osteopenia 12/29/2006   Qualifier: Diagnosis of  By: Wynona Luna   . Pneumonia   . Polymyalgia rheumatica ([place]) 01/07/2016  . Renal insufficiency 09/22/2016  . Unspecified constipation 06/05/2013  . Vitamin D deficiency 01/01/2015    Past Surgical History:  Procedure Laterality Date  . APPENDECTOMY  1951  . TONSILLECTOMY  1950  . TUBAL LIGATION  1968    Social History   Socioeconomic History  . Marital status: Widowed    Spouse name: Not on file  . Number of children: 1  . Years of education: Not on file  . Highest education level: Not on file  Occupational History  . Occupation: Retired    Fish farm manager: [employer]  Tobacco Use  . Smoking status: Never Smoker  . Smokeless tobacco: Never Used  Substance and Sexual Activity  . Alcohol use: No  . Drug use: No  . Sexual activity: Never    Comment: lives alone, no dietary restrictions  Other Topics Concern  . Not on file  Social History Narrative  . Not on file   Social Determinants of Health   Financial Resource Strain:   . Difficulty of Paying Living Expenses:   Food Insecurity:   . Worried About Charity fundraiser in the Last Year:   . Arboriculturist in the Last Year:   [employer]  Needs:   . Lack of Transportation (Medical):   Marland Kitchen Lack of Transportation (Non-Medical):   Physical Activity:   . Days of Exercise per Week:   . Minutes of Exercise per Session:   Stress:   . Feeling of Stress :   Social Connections:   . Frequency of Communication with Friends and Family:   . Frequency of Social Gatherings with Friends and Family:   . Attends Religious Services:   . Active Member of Clubs or Organizations:   . Attends Archivist Meetings:   Marland Kitchen Marital Status:   Intimate Partner Violence:   . Fear of Current or Ex-Partner:   . Emotionally Abused:   Marland Kitchen Physically Abused:   . Sexually Abused:     Allergies  Allergen Reactions   . Montelukast Sodium Other (See Comments)    REACTION: HEART PALPITATIONS, CHEST PAIN.  Marland Kitchen Sulfa Antibiotics Other (See Comments)    CHEST PAIN  . Sulfonamide Derivatives Other (See Comments)    CHEST PAIN  . Oseltamivir Phosphate Diarrhea, Nausea And Vomiting and Other (See Comments)    TAMIFLU REACTION:  dizziness  . Ace Inhibitors Other (See Comments)    PT. STATED UNKNOWN REACTION  . Indomethacin Other (See Comments)    PT. STATED UNKNOWN REACTION    Family History  Problem Relation Age of Onset  . Asthma Sister   . Hypertension Sister   . Hyperlipidemia Sister   . Uterine cancer Sister   . Coronary artery disease Brother        x2  . Arthritis Brother   . Lung cancer Brother        smoker  . Hypertension Sister   . Arthritis Sister   . Hyperlipidemia Sister   . Emphysema Sister   . COPD Sister        smoker  . Heart disease Sister   . Hyperlipidemia Sister   . Hypertension Sister   . Arthritis Sister   . Emphysema Brother   . Heart disease Brother         smoker  . Heart attack Brother   . Mental illness Father   . Suicidality Father   . Heart disease Mother   . Hyperlipidemia Mother   . Heart attack Mother   . Epilepsy Daughter   . Hypertension Daughter   . Obesity Daughter   . COPD Brother        smoker  . Lung cancer Brother   . Coronary artery disease Other   . Colon polyps Sister     Prior to Admission medications   Medication Sig Start Date End Date Taking? Authorizing Provider  acetaminophen (TYLENOL) 500 MG tablet Take 500 mg by mouth 2 (two) times daily.    Yes [provider]  albuterol (ACCUNEB) 0.63 MG/3ML nebulizer solution Take 3 mLs (0.63 mg total) by nebulization every 6 (six) hours as needed for wheezing or shortness of breath. 01/16/17  Yes Mosie Lukes, MD  albuterol (VENTOLIN HFA) 108 (90 Base) MCG/ACT inhaler Inhale 2 puffs into the lungs 4 (four) times daily as needed for wheezing or shortness of breath. 06/30/18  Yes  Mosie Lukes, MD  allopurinol (ZYLOPRIM) 100 MG tablet TAKE 2 TABLETS ONE TIME DAILY Patient taking differently: Take 200 mg by mouth daily.  02/03/19  Yes Mosie Lukes, MD  b complex vitamins tablet Take 1 tablet by mouth daily.   Yes [provider]  budesonide-formoterol (SYMBICORT) 160-4.5 MCG/ACT inhaler Inhale 2 puffs  into the lungs 2 (two) times daily. 06/30/18  Yes Mosie Lukes, MD  cholecalciferol (VITAMIN D) 1000 units tablet Take 1,000 Units daily by mouth.   Yes [provider]  Ferrous Fumarate-Folic Acid (HEMOCYTE-F) 324-1 MG TABS Take 1 tablet by mouth daily. 07/24/16  Yes Mosie Lukes, MD  fluticasone (FLONASE) 50 MCG/ACT nasal spray Place 1 spray into both nostrils daily. Patient taking differently: Place 1 spray into both nostrils daily as needed for allergies.  03/06/16  Yes Magdalen Spatz, NP  furosemide (LASIX) 40 MG tablet TAKE 1 TABLET DAILY AND A SECOND TABLET DAILY AS NEEDED FOR INCREASED EDEMA OR WEIGHT GAIN GREATER THAN 3 POUNDS IN 24 HOURS Patient taking differently: Take 20 mg by mouth daily.  02/03/19  Yes Mosie Lukes, MD  loratadine (CLARITIN) 10 MG tablet Take 1 tablet (10 mg total) by mouth at bedtime. Patient taking differently: Take 10 mg by mouth at bedtime as needed for allergies.  03/06/16  Yes Magdalen Spatz, NP  metoprolol succinate (TOPROL-XL) 50 MG 24 hr tablet TAKE 1 AND 1/2 TABLETS EVERY DAY Patient taking differently: Take 75 mg by mouth daily.  05/28/18  Yes Mosie Lukes, MD  montelukast (SINGULAIR) 10 MG tablet TAKE 1 TABLET AT BEDTIME Patient taking differently: Take 10 mg by mouth at bedtime.  02/02/19  Yes Tanda Rockers, MD  pantoprazole (PROTONIX) 40 MG tablet Take 40 mg by mouth 2 (two) times daily. 05/18/18  Yes [provider]  potassium chloride SA (KLOR-CON) 20 MEQ tablet TAKE 1 TABLET DAILY, AND TAKE A SECOND TABLET DAILY AS NEEDED FOR EXTRA FUROSEMIDE TABLET AS DIRECTED. Patient taking differently: Take  20 mEq by mouth daily.  02/25/19  Yes Mosie Lukes, MD  pravastatin (PRAVACHOL) 40 MG tablet Take 1 tablet (40 mg total) by mouth every evening. 02/09/19  Yes Turner, Eber Hong, MD  Probiotic Product (PROBIOTIC PO) Take 1 tablet by mouth daily.    Yes [provider]  sertraline (ZOLOFT) 100 MG tablet TAKE 1 TABLET EVERY DAY Patient taking differently: Take 100 mg by mouth daily.  08/30/18  Yes Mosie Lukes, MD  traMADol (ULTRAM) 50 MG tablet TAKE 1 TABLET BY MOUTH THREE TIMES DAILY AS NEEDED FOR MODERATE PAIN Patient taking differently: Take 50 mg by mouth 3 (three) times daily as needed for moderate pain.  04/05/19  Yes Mosie Lukes, MD  trolamine salicylate (ASPERCREME) 10 % cream Apply 1 application topically as needed for muscle pain.   Yes [provider]  vitamin B-12 (CYANOCOBALAMIN) 1000 MCG tablet Take 1,000 mcg by mouth daily.    Yes [provider]  ACCU-CHEK AVIVA PLUS test strip  07/01/18   [provider]  Accu-Chek Softclix Lancets lancets  07/01/18   [provider]  Alcohol Swabs (ALCOHOL WIPES) 70 % PADS To use before checking blood sugars 06/30/18   Mosie Lukes, MD  benzonatate (TESSALON) 100 MG capsule Take 1 capsule (100 mg total) by mouth 3 (three) times daily as needed for cough. Patient not taking: Reported on 04/07/2019 01/25/18   Mosie Lukes, MD  Blood Glucose Calibration (ACCU-CHEK AVIVA) SOLN  07/01/18   [provider]  blood glucose meter kit and supplies Check blood sugars once weekly. 06/30/18   Mosie Lukes, MD  OXYGEN Inhale 2 L into the lungs continuous.     [provider]    Physical Exam: Vitals:   04/07/19 1045 04/07/19 1100 04/07/19  1115 04/07/19 1130  BP: (!) 104/55 (!) 95/55 (!) 101/52 (!) 104/55  Pulse: 75 72 74 72  Resp: 14 (!) 22 15 (!) 26  Temp:      TempSrc:      SpO2: 100% 100% 100% 100%  Weight:      Height:         . General:  Appears calm but uncomfortable and is  NAD . Eyes:  PERRL, EOMI, normal lids, iris . ENT:  grossly normal hearing, lips & tongue, mmm; appropriate dentition . Neck:  no LAD, masses or thyromegaly . Cardiovascular:  RRR, no m/r/g. No LE edema.  Marland Kitchen Respiratory:   CTA bilaterally with no wheezes/rales/rhonchi.  Normal respiratory effort.  No apparent ribcage deformity.  No apparent flail chest. . Abdomen:  soft, NT, ND, NABS . Skin:  Marked ecchymosis along left upper back associated with fall . Musculoskeletal:  grossly normal tone BUE/BLE, good ROM, no bony abnormality . Psychiatric:  grossly normal mood and affect, speech fluent and appropriate, AOx3 . Neurologic:  CN 2-12 grossly intact, moves all extremities in coordinated fashion    Radiological Exams on Admission: CT Chest Wo Contrast  Result Date: 04/07/2019 CLINICAL DATA:  Tripped and fell, struck left-sided of chest on a wooden chair, pain worse with inspiration, history of pneumonia, hypoxic, hypertension, GERD and COPD EXAM: CT CHEST WITHOUT CONTRAST TECHNIQUE: Multidetector CT imaging of the chest was performed following the standard protocol without IV contrast. COMPARISON:  CT chest 01/12/2014 FINDINGS: Cardiovascular: Mild cardiomegaly with predominantly right heart enlargement. Coronary artery calcifications are present. Some dense calcifications noted at the level of mitral annulus as well. No pericardial effusion. Central pulmonary arteries are borderline enlarged. Luminal evaluation precluded in the absence of contrast. Atherosclerotic plaque within the normal caliber aorta. Shared origin of the brachiocephalic and left common carotid artery. Minimal plaque in the proximal great vessels. Mediastinum/Nodes: Scattered low-attenuation prominent though nonenlarged mediastinal nodes are similar to comparison 01/12/2014. No axillary adenopathy. Hilar nodes difficult to assess in the absence of contrast media. No acute abnormality of the trachea or esophagus. Thyroid gland and  thoracic inlet are unremarkable. Lungs/Pleura: There are diffuse subpleural reticular changes, most notably within the lung apices. Some of these some interlobular septal thickening is present as well with peribronchovascular cuffing. No focal consolidative opacity is seen. No left pneumothorax. Trace left pleural thickening, may reflect small amount of pleural fluid. Upper Abdomen: Few calcified gallstones present within the otherwise normal gallbladder. No acute abnormalities present in the visualized portions of the upper abdomen. No evidence of direct splenic injury or perisplenic hemorrhage Musculoskeletal: Minimally displaced acute lateral left seventh through tenth rib fractures with additional segmental posterior fractures of the eighth through tenth ribs. Several additional remote left-sided rib fractures are noted as well. IMPRESSION: 1. Acute minimally displaced left seventh through tenth rib fractures which are segmental from the eighth through tenth ribs with fracture seen both laterally and posteriorly. Additional chronic left-sided rib fractures noted as well. Given contiguous segmental fractures, correlate for flail chest morphology. 2. Trace left pleural thickening may reflect small amount of pleural fluid. No pneumothorax. 3. Apical reticular features suggest some component of chronic interstitial disease likely with superimposed interstitial edematous change. 4. 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 5. Coronary artery calcifications. 6. Cholelithiasis. 7. Aortic Atherosclerosis (ICD10-I70.0). Electronically Signed   By: Lovena Le M.D.   On: 04/07/2019 05:50    EKG: not done   Labs on Admission: I  have personally reviewed the available labs and imaging studies at the time of the admission.  Pertinent labs:   Glucose 135 BUN 57/Creatinine 1.81/GFR 26 WBC 6.9 Platelets 95 COVID pending   Assessment/Plan Principal Problem:    Rib fracture Active Problems:   Hyperlipidemia, mixed   Gout   Essential hypertension   Diabetes type 2, controlled ([place])   Fall at home, initial encounter   Chronic respiratory failure with hypoxia and hypercapnia (HCC)   Polymyalgia rheumatica (HCC)   Chronic combined systolic and diastolic CHF (congestive heart failure) (HCC)   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HCC)   L 7-10 acute rib fractures with evidence of old fractures as well -Patient with a mechanical fall resulting in left-sided rib fractures -While the location of the fractures creates a risk for flail chest, this is not apparent at this time -Trauma surgery has consulted and will follow -Needs aggressive pulmonary toilet -Needs pain control -PT/OT consults requested; patient lives alone and will need to be able to function independently in order to return to this situation  Chronic diastolic CHF -31/59 echo with preserved EF and grade 1 diastolic dysfunction; h/o EF 50-55% in 4/18 -Appears to be compensated -Hold Lasix for now  COPD -Continue 2L home O2 -Continue Albuterol nebs prn when COVID negative -Continue Symbicort -Continue Claritin and Singulair  Stage 4 CKD -Appears to be at/near baseline at this time -Stable anemia with plan for Aranesp when Hgb <11  H/o DVT -Reported history but negative studies in 2013, 2017 x 2 -No on AC  HTN -Continue Toprol XL  HLD -Continue Pravachol  Gout -Continue Allopurinol  H/o PMR -remote history -Appears to no longer be taking steroids for this issue  DM -Diet controlled -Recent A1c 5.7 -Will not test/treat at this time  Thrombocytopenia -Stable, chronic   Note: This patient has been tested and is pending for the novel coronavirus COVID-19.  DVT prophylaxis:  Lovenox  Code Status:  DNR - confirmed with patient Family Communication: None present; I spoke with the patient's daughter by telephone at the time of admission. Disposition Plan: She is  anticipated to d/c to home once her rib fracture issues have been resolved; need for [facility] services will be determined by PT/OT evaluations. Consults called: Trauma surgery; PT/OT Admission status: Admit - It is my clinical opinion that admission to INPATIENT is reasonable and necessary because of the expectation that this patient will require hospital care that crosses at least 2 midnights to treat this condition based on the medical complexity of the problems presented.  Given the aforementioned information, the predictability of an adverse outcome is felt to be significant.    Karmen Bongo MD [facility]   How to contact the [facility] Attending or Consulting provider [place] or covering provider during after hours [place], for this patient?  1. Check the care team in [facility] and look for a) attending/consulting [facility] provider listed and b) the [facility] team listed 2. Log into [email or web address] and use [place] universal password to access. If you do not have the password, please contact the hospital operator. 3. Locate the [facility] provider you are looking for under [facility] and page to a number that you can be directly reached. 4. If you still have difficulty reaching the provider, please page the [facility] (Director on Call) for the Hospitalists listed on amion for assistance.   04/07/2019, 11:42 AM

## 2019-04-07 NOTE — ED Provider Notes (Signed)
[facility] EMERGENCY DEPARTMENT Provider Note  CSN: [id number] Arrival date & time: 04/07/19 5003  Chief Complaint(s) Fall  HPI [name] is a 79 y.o. female with extensive past medical history listed below who presents to the emergency department after mechanical fall that occurred around 2:58 AM this morning.  Patient reports that she awoke to go to the bathroom.  After that she went into the kitchen to get a drink of water.  While walking in the kitchen, she tripped on the floor causing her to fall onto kitchen chair.  She hit the left side of her torso on the way down.  Denied any headache or loss of consciousness.  Denies any neck pain or midline back pain.  No hip pain or extremity pain.  Left thoracic/chest wall pain is severe.  Worse with movements, palpation, breathing and talking.  Mildly alleviated by mobility.  Denies any other physical complaints.  Patient not on any anticoagulation.  HPI  Past Medical History Past Medical History:  Diagnosis Date  . Abnormal glucose tolerance test   . Abnormal TSH 09/22/2016  . ACE-inhibitor cough   . Anemia 03/12/2014  . Arthritis   . Asthma    PFT 02/06/09 FEV1 1.41 (65%), FVC 1.92 (64), FEV1% 74, TLC 3.47 (71%), DLCO 48%, +BD  . Atypical chest pain    s/p cath, Normal coronaries, Non ST elevation myocardial infarction, Rt groin pseudoaneurysm  . Bacterial vaginosis 03/12/2014  . Cellulitis 06/22/2016  . Chronic kidney disease (CKD), stage III (moderate) 08/04/2016  . COPD (chronic obstructive pulmonary disease) ([place])   . Depression   . Diastolic heart failure ([place]) 04/07/2016  . DVT (deep venous thrombosis) ([place]) 1987  . GERD (gastroesophageal reflux disease)   . Gout   . Hypercalcemia 10/15/2014  . Hyperlipidemia   . Hypertension   . Hypoxia 10/15/2014  . Macular degeneration 04/10/2015  . Osteopenia 12/29/2006   Qualifier: Diagnosis of  By: Wynona Luna   . Pneumonia   . Polymyalgia rheumatica ([place])  01/07/2016  . Renal insufficiency 09/22/2016  . Unspecified constipation 06/05/2013  . Vitamin D deficiency 01/01/2015   Patient Active Problem List   Diagnosis Date Noted  . Chronic asthma, mild persistent, uncomplicated 70/48/8891  . High serum vitamin B12 11/20/2017  . Subarachnoid hemorrhage ([place]) 11/05/2017  . Intracranial hemorrhage ([place]) 11/04/2017  . Syncope and collapse 11/04/2017  . Thrombocytopenia ([place]) 10/16/2017  . 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 Vitamin D deficiency 06/16/2017  . Hypernatremia 12/25/2016  . Nonintractable headache 10/26/2016  . Leukocytes in urine 10/26/2016  . Peripheral arterial disease ([place]) 10/01/2016  . Renal insufficiency 09/22/2016  . Abnormal TSH 09/22/2016  . Chronic kidney disease (CKD), stage III (moderate) ([place]) 08/04/2016  . Heart murmur 04/25/2016  . Chronic combined systolic and diastolic CHF (congestive heart failure) ([place]) 04/07/2016  . Bronchitis 03/06/2016  . Polymyalgia rheumatica ([place]) 01/07/2016  . Pedal edema 08/28/2015  . Chronic respiratory failure with hypoxia and hypercapnia ([place]) 06/28/2015  . Macular degeneration 04/10/2015  . Hypercalcemia 10/15/2014  . Hypoxia 10/15/2014  . 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 Cervical cancer screening 02/28/2014  . Eustachian tube dysfunction 01/29/2014  . Medicare annual wellness visit, subsequent 12/04/2013  . Constipation 06/05/2013  . Shortness of breath 03/07/2013  . Tachycardia 02/23/2013  . Sun-damaged skin 10/24/2012  . Lower urinary tract infectious disease 10/24/2012  . Back pain 04/07/2011  . Allergic rhinitis 05/23/2009  . Gout  01/11/2008  . VARICOSE VEINS, LOWER EXTREMITIES 06/01/2007  . ADJ DISORDER WITH MIXED ANXIETY & DEPRESSED MOOD 03/25/2007  . Hyperlipidemia, mixed 12/29/2006  . Essential hypertension 12/29/2006  . Osteopenia 12/29/2006  . Abdominal pain 12/29/2006  . Diabetes type 2, controlled ([place])  12/29/2006  . Asthma, moderate persistent 08/21/2006  . GERD 08/21/2006  . Osteoarthritis 08/21/2006  . Phlebitis and thrombophlebitis 08/21/2006   Home Medication(s) Prior to Admission medications   Medication Sig Start Date End Date Taking? Authorizing Provider  acetaminophen (TYLENOL) 500 MG tablet Take 500 mg by mouth 2 (two) times daily.    Yes [provider]  albuterol (ACCUNEB) 0.63 MG/3ML nebulizer solution Take 3 mLs (0.63 mg total) by nebulization every 6 (six) hours as needed for wheezing or shortness of breath. 01/16/17  Yes Mosie Lukes, MD  albuterol (VENTOLIN HFA) 108 (90 Base) MCG/ACT inhaler Inhale 2 puffs into the lungs 4 (four) times daily as needed for wheezing or shortness of breath. 06/30/18  Yes Mosie Lukes, MD  allopurinol (ZYLOPRIM) 100 MG tablet TAKE 2 TABLETS ONE TIME DAILY Patient taking differently: Take 200 mg by mouth daily.  02/03/19  Yes Mosie Lukes, MD  b complex vitamins tablet Take 1 tablet by mouth daily.   Yes [provider]  budesonide-formoterol (SYMBICORT) 160-4.5 MCG/ACT inhaler Inhale 2 puffs into the lungs 2 (two) times daily. 06/30/18  Yes Mosie Lukes, MD  cholecalciferol (VITAMIN D) 1000 units tablet Take 1,000 Units daily by mouth.   Yes [provider]  Ferrous Fumarate-Folic Acid (HEMOCYTE-F) 324-1 MG TABS Take 1 tablet by mouth daily. 07/24/16  Yes Mosie Lukes, MD  fluticasone (FLONASE) 50 MCG/ACT nasal spray Place 1 spray into both nostrils daily. Patient taking differently: Place 1 spray into both nostrils daily as needed for allergies.  03/06/16  Yes Magdalen Spatz, NP  furosemide (LASIX) 40 MG tablet TAKE 1 TABLET DAILY AND A SECOND TABLET DAILY AS NEEDED FOR INCREASED EDEMA OR WEIGHT GAIN GREATER THAN 3 POUNDS IN 24 HOURS Patient taking differently: Take 20 mg by mouth daily.  02/03/19  Yes Mosie Lukes, MD  loratadine (CLARITIN) 10 MG tablet Take 1 tablet (10 mg total) by mouth at bedtime.  Patient taking differently: Take 10 mg by mouth at bedtime as needed for allergies.  03/06/16  Yes Magdalen Spatz, NP  metoprolol succinate (TOPROL-XL) 50 MG 24 hr tablet TAKE 1 AND 1/2 TABLETS EVERY DAY Patient taking differently: Take 75 mg by mouth daily.  05/28/18  Yes Mosie Lukes, MD  montelukast (SINGULAIR) 10 MG tablet TAKE 1 TABLET AT BEDTIME Patient taking differently: Take 10 mg by mouth at bedtime.  02/02/19  Yes Tanda Rockers, MD  pantoprazole (PROTONIX) 40 MG tablet Take 40 mg by mouth 2 (two) times daily. 05/18/18  Yes [provider]  potassium chloride SA (KLOR-CON) 20 MEQ tablet TAKE 1 TABLET DAILY, AND TAKE A SECOND TABLET DAILY AS NEEDED FOR EXTRA FUROSEMIDE TABLET AS DIRECTED. Patient taking differently: Take 20 mEq by mouth daily.  02/25/19  Yes Mosie Lukes, MD  pravastatin (PRAVACHOL) 40 MG tablet Take 1 tablet (40 mg total) by mouth every evening. 02/09/19  Yes Turner, Eber Hong, MD  Probiotic Product (PROBIOTIC PO) Take 1 tablet by mouth daily.    Yes [provider]  sertraline (ZOLOFT) 100 MG tablet TAKE 1 TABLET EVERY DAY Patient taking differently: Take 100 mg by mouth daily.  08/30/18  Yes Penni Homans  A, MD  traMADol (ULTRAM) 50 MG tablet TAKE 1 TABLET BY MOUTH THREE TIMES DAILY AS NEEDED FOR MODERATE PAIN Patient taking differently: Take 50 mg by mouth 3 (three) times daily as needed for moderate pain.  04/05/19  Yes Mosie Lukes, MD  trolamine salicylate (ASPERCREME) 10 % cream Apply 1 application topically as needed for muscle pain.   Yes [provider]  vitamin B-12 (CYANOCOBALAMIN) 1000 MCG tablet Take 1,000 mcg by mouth daily.    Yes [provider]  ACCU-CHEK AVIVA PLUS test strip  07/01/18   [provider]  Accu-Chek Softclix Lancets lancets  07/01/18   [provider]  Alcohol Swabs (ALCOHOL WIPES) 70 % PADS To use before checking blood sugars 06/30/18   Mosie Lukes, MD  benzonatate (TESSALON) 100 MG  capsule Take 1 capsule (100 mg total) by mouth 3 (three) times daily as needed for cough. Patient not taking: Reported on 04/07/2019 01/25/18   Mosie Lukes, MD  Blood Glucose Calibration (ACCU-CHEK AVIVA) SOLN  07/01/18   [provider]  blood glucose meter kit and supplies Check blood sugars once weekly. 06/30/18   Mosie Lukes, MD  OXYGEN Inhale 2 L into the lungs continuous.     [provider]                                                                                                                                    Past Surgical History Past Surgical History:  Procedure Laterality Date  . APPENDECTOMY  1951  . TONSILLECTOMY  1950  . TUBAL LIGATION  1968   Family History Family History  Problem Relation Age of Onset  . Asthma Sister   . Hypertension Sister   . Hyperlipidemia Sister   . Uterine cancer Sister   . Coronary artery disease Brother        x2  . Arthritis Brother   . Lung cancer Brother        smoker  . Hypertension Sister   . Arthritis Sister   . Hyperlipidemia Sister   . Emphysema Sister   . COPD Sister        smoker  . Heart disease Sister   . Hyperlipidemia Sister   . Hypertension Sister   . Arthritis Sister   . Emphysema Brother   . Heart disease Brother         smoker  . Heart attack Brother   . Mental illness Father   . Suicidality Father   . Heart disease Mother   . Hyperlipidemia Mother   . Heart attack Mother   . Epilepsy Daughter   . Hypertension Daughter   . Obesity Daughter   . COPD Brother        smoker  . Lung cancer Brother   . Coronary artery disease Other   . Colon polyps Sister     Social History Social History  Tobacco Use  . Smoking status: Never Smoker  . Smokeless tobacco: Never Used  Substance Use Topics  . Alcohol use: No  . Drug use: No   Allergies Montelukast sodium, Sulfa antibiotics, Sulfonamide derivatives, Oseltamivir phosphate, Ace inhibitors, and Indomethacin  Review of  Systems Review of Systems All other systems are reviewed and are negative for acute change except as noted in the HPI  Physical Exam Vital Signs  I have reviewed the triage vital signs BP 126/69 (BP Location: Right Arm)   Pulse 76   Temp (!) 96.8 F (36 C) (Temporal)   Resp (!) 23   Ht '5\' 4"'$  (1.626 m)   Wt 84.4 kg   SpO2 97%   BMI 31.93 kg/m   Physical Exam Constitutional:      General: She is not in acute distress.    Appearance: She is well-developed. She is not diaphoretic.  HENT:     Head: Normocephalic and atraumatic.     Right Ear: External ear normal.     Left Ear: External ear normal.     Nose: Nose normal.  Eyes:     General: No scleral icterus.       Right eye: No discharge.        Left eye: No discharge.     Conjunctiva/sclera: Conjunctivae normal.     Pupils: Pupils are equal, round, and reactive to light.  Cardiovascular:     Rate and Rhythm: Normal rate and regular rhythm.     Pulses:          Radial pulses are 2+ on the right side and 2+ on the left side.       Dorsalis pedis pulses are 2+ on the right side and 2+ on the left side.     Heart sounds: Normal heart sounds. No murmur. No friction rub. No gallop.   Pulmonary:     Effort: Pulmonary effort is normal. No respiratory distress.     Breath sounds: Normal breath sounds. No stridor. No wheezing.    Chest:     Chest wall: Tenderness present.    Abdominal:     General: There is no distension.     Palpations: Abdomen is soft.     Tenderness: There is no abdominal tenderness.  Musculoskeletal:        General: No tenderness.     Cervical back: Normal range of motion and neck supple. No bony tenderness.     Thoracic back: No bony tenderness.     Lumbar back: No bony tenderness.     Comments: Clavicles stable. Chest stable to AP/Lat compression. Pelvis stable to Lat compression. No obvious extremity deformity. No chest or abdominal wall contusion.  Skin:    General: Skin is warm and dry.      Findings: No erythema or rash.  Neurological:     Mental Status: She is alert and oriented to person, place, and time.     Comments: Moving all extremities     ED Results and Treatments Labs (all labs ordered are listed, but only abnormal results are displayed) Labs Reviewed  CBC WITH DIFFERENTIAL/PLATELET - Abnormal; Notable for the following components:      Result Value   RBC 3.51 (*)    Hemoglobin 11.4 (*)    MCV 104.3 (*)    Platelets 95 (*)    All other components within normal limits  BASIC METABOLIC PANEL - Abnormal; Notable for the following components:   Glucose, Bld 135 (*)  BUN 57 (*)    Creatinine, Ser 1.81 (*)    GFR calc non Af Amer 26 (*)    GFR calc Af Amer 30 (*)    All other components within normal limits                                                                                                                         EKG  EKG Interpretation  Date/Time:    Ventricular Rate:    PR Interval:    QRS Duration:   QT Interval:    QTC Calculation:   R Axis:     Text Interpretation:        Radiology CT Chest Wo Contrast  Result Date: 04/07/2019 CLINICAL DATA:  Tripped and fell, struck left-sided of chest on a wooden chair, pain worse with inspiration, history of pneumonia, hypoxic, hypertension, GERD and COPD EXAM: CT CHEST WITHOUT CONTRAST TECHNIQUE: Multidetector CT imaging of the chest was performed following the standard protocol without IV contrast. COMPARISON:  CT chest 01/12/2014 FINDINGS: Cardiovascular: Mild cardiomegaly with predominantly right heart enlargement. Coronary artery calcifications are present. Some dense calcifications noted at the level of mitral annulus as well. No pericardial effusion. Central pulmonary arteries are borderline enlarged. Luminal evaluation precluded in the absence of contrast. Atherosclerotic plaque within the normal caliber aorta. Shared origin of the brachiocephalic and left common carotid artery. Minimal plaque  in the proximal great vessels. Mediastinum/Nodes: Scattered low-attenuation prominent though nonenlarged mediastinal nodes are similar to comparison 01/12/2014. No axillary adenopathy. Hilar nodes difficult to assess in the absence of contrast media. No acute abnormality of the trachea or esophagus. Thyroid gland and thoracic inlet are unremarkable. Lungs/Pleura: There are diffuse subpleural reticular changes, most notably within the lung apices. Some of these some interlobular septal thickening is present as well with peribronchovascular cuffing. No focal consolidative opacity is seen. No left pneumothorax. Trace left pleural thickening, may reflect small amount of pleural fluid. Upper Abdomen: Few calcified gallstones present within the otherwise normal gallbladder. No acute abnormalities present in the visualized portions of the upper abdomen. No evidence of direct splenic injury or perisplenic hemorrhage Musculoskeletal: Minimally displaced acute lateral left seventh through tenth rib fractures with additional segmental posterior fractures of the eighth through tenth ribs. Several additional remote left-sided rib fractures are noted as well. IMPRESSION: 1. Acute minimally displaced left seventh through tenth rib fractures which are segmental from the eighth through tenth ribs with fracture seen both laterally and posteriorly. Additional chronic left-sided rib fractures noted as well. Given contiguous segmental fractures, correlate for flail chest morphology. 2. Trace left pleural thickening may reflect small amount of pleural fluid. No pneumothorax. 3. Apical reticular features suggest some component of chronic interstitial disease likely with superimposed interstitial edematous change. 4. 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 5. Coronary artery calcifications. 6. Cholelithiasis. 7. Aortic Atherosclerosis (ICD10-I70.0). Electronically Signed   By: Lovena Le  M.D.   On: 04/07/2019 05:50  Pertinent labs & imaging results that were available during my care of the patient were reviewed by me and considered in my medical decision making (see chart for details).  Medications Ordered in ED Medications  HYDROmorphone (DILAUDID) injection 0.5 mg (0.5 mg Intravenous Given 04/07/19 0510)                                                                                                                                    Procedures Procedures  (including critical care time)  Medical Decision Making / ED Course I have reviewed the nursing notes for this encounter and the patient's prior records (if available in EHR or on provided paperwork).   GAYLEEN SHOLTZ was evaluated in Emergency Department on 04/07/2019 for the symptoms described in the history of present illness. She was evaluated in the context of the global COVID-19 pandemic, which necessitated consideration that the patient might be at risk for infection with the SARS-CoV-2 virus that causes COVID-19. Institutional protocols and algorithms that pertain to the evaluation of patients at risk for COVID-19 are in a state of rapid change based on information released by regulatory bodies including the CDC and federal and state organizations. These policies and algorithms were followed during the patient's care in the ED.    Clinical Course as of Apr 06 628  Thu Apr 07, 2019  0602 Mechanical fall resulting in left-sided thoracic chest wall pain.  CT scan obtained notable for rib fractures from 7-10th.  Segment 8-10 are fractured in 2 places laterally and posteriorly.  No other injuries noted.  Patient provided with IV pain medicine.  Screening labs obtained.  Given extent of fractures, patient will require admission for pain control and pulmonary toilet.  We will discuss case with trauma surgery.   [PC]  [id number] Spoke with Dr. Dema Severin from trauma surgery who agrees on admission and plan.  They will follow  the patient closely in consultation.  Given significant comorbidities, they felt patient would be better served on the medicine unit.  Consulting hospitalist team for admission.   [PC]    Clinical Course User Index [PC] Travor Royce, Grayce Sessions, MD     Final Clinical Impression(s) / ED Diagnoses Final diagnoses:  Fall, initial encounter  Multiple fractures of ribs, left side, initial encounter for closed fracture      This chart was dictated using voice recognition software.  Despite best efforts to proofread,  errors can occur which can change the documentation meaning.   Fatima Blank, MD 04/07/19 [phone]

## 2019-04-07 NOTE — H&P (Signed)
CC: Left chest wall pain  Requesting provider: Dr. Leonette Monarch  HPI: [name] is an 79 y.o. female with hx HTN, HLD, GERD, CHF, COPD, CKD3, gout, whom presented to the emergency department after sustaining a fall around 2 AM.  She reports she was getting up to go to the restroom and get a cup of water when her shoe "got hung up on the carpet" and she fell, landing on the seat portion of a chair on her left chest.  She experienced severe pain in her left chest.  She denied falling and hitting her head, losing consciousness, or pain anywhere else in her body.  She presented to the emergency department for evaluation.  She characterized the pain in her left chest as being in the chest wall area, worse with inspiration/expiration as well as palpation.  Taking shallow breaths seems to make the pain better.  Pain does not radiate.  She does have a history of broken ribs and states this feels just like that.  Past Medical History:  Diagnosis Date  . Abnormal glucose tolerance test   . Abnormal TSH 09/22/2016  . ACE-inhibitor cough   . Anemia 03/12/2014  . Arthritis   . Asthma    PFT 02/06/09 FEV1 1.41 (65%), FVC 1.92 (64), FEV1% 74, TLC 3.47 (71%), DLCO 48%, +BD  . Atypical chest pain    s/p cath, Normal coronaries, Non ST elevation myocardial infarction, Rt groin pseudoaneurysm  . Bacterial vaginosis 03/12/2014  . Cellulitis 06/22/2016  . Chronic kidney disease (CKD), stage III (moderate) 08/04/2016  . COPD (chronic obstructive pulmonary disease) ([place])   . Depression   . Diastolic heart failure ([place]) 04/07/2016  . DVT (deep venous thrombosis) ([place]) 1987  . GERD (gastroesophageal reflux disease)   . Gout   . Hypercalcemia 10/15/2014  . Hyperlipidemia   . Hypertension   . Hypoxia 10/15/2014  . Macular degeneration 04/10/2015  . Osteopenia 12/29/2006   Qualifier: Diagnosis of  By: Wynona Luna   . Pneumonia   . 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 Renal insufficiency 09/22/2016  .  Unspecified constipation 06/05/2013  . Vitamin D deficiency 01/01/2015    Past Surgical History:  Procedure Laterality Date  . APPENDECTOMY  1951  . TONSILLECTOMY  1950  . TUBAL LIGATION  1968    Family History  Problem Relation Age of Onset  . Asthma Sister   . Hypertension Sister   . Hyperlipidemia Sister   . Uterine cancer Sister   . Coronary artery disease Brother        x2  . Arthritis Brother   . Lung cancer Brother        smoker  . Hypertension Sister   . Arthritis Sister   . Hyperlipidemia Sister   . Emphysema Sister   . COPD Sister        smoker  . Heart disease Sister   . Hyperlipidemia Sister   . Hypertension Sister   . Arthritis Sister   . Emphysema Brother   . Heart disease Brother         smoker  . Heart attack Brother   . Mental illness Father   . Suicidality Father   . Heart disease Mother   . Hyperlipidemia Mother   . Heart attack Mother   . Epilepsy Daughter   . Hypertension Daughter   . Obesity Daughter   . COPD Brother        smoker  . Lung cancer Brother   .  Coronary artery disease Other   . Colon polyps Sister     Social:  reports that she has never smoked. She has never used smokeless tobacco. She reports that she does not drink alcohol or use drugs.  Allergies:  Allergies  Allergen Reactions  . Montelukast Sodium Other (See Comments)    REACTION: HEART PALPITATIONS, CHEST PAIN.  Marland Kitchen Sulfa Antibiotics Other (See Comments)    CHEST PAIN  . Sulfonamide Derivatives Other (See Comments)    CHEST PAIN  . Oseltamivir Phosphate Diarrhea, Nausea And Vomiting and Other (See Comments)    TAMIFLU REACTION:  dizziness  . Ace Inhibitors Other (See Comments)    PT. STATED UNKNOWN REACTION  . Indomethacin Other (See Comments)    PT. STATED UNKNOWN REACTION    Medications: I have reviewed the patient's current medications.  Results for orders placed or performed during the hospital encounter of 04/07/19 (from the past 48 hour(s))  CBC with  Differential/Platelet     Status: Abnormal   Collection Time: 04/07/19  5:11 AM  Result Value Ref Range   WBC 6.9 4.0 - 10.5 K/uL   RBC 3.51 (L) 3.87 - 5.11 MIL/uL   Hemoglobin 11.4 (L) 12.0 - 15.0 g/dL   HCT 36.6 36.0 - 46.0 %   MCV 104.3 (H) 80.0 - 100.0 fL   MCH 32.5 26.0 - 34.0 pg   MCHC 31.1 30.0 - 36.0 g/dL   RDW 13.2 11.5 - 15.5 %   Platelets 95 (L) 150 - 400 K/uL    Comment: REPEATED TO VERIFY PLATELET COUNT CONFIRMED BY SMEAR Immature Platelet Fraction may be clinically indicated, consider ordering this additional test [id number]    nRBC 0.0 0.0 - 0.2 %   Neutrophils Relative % 72 %   Neutro Abs 5.0 1.7 - 7.7 K/uL   Lymphocytes Relative 14 %   Lymphs Abs 1.0 0.7 - 4.0 K/uL   Monocytes Relative 10 %   Monocytes Absolute 0.7 0.1 - 1.0 K/uL   Eosinophils Relative 3 %   Eosinophils Absolute 0.2 0.0 - 0.5 K/uL   Basophils Relative 0 %   Basophils Absolute 0.0 0.0 - 0.1 K/uL   Immature Granulocytes 1 %   Abs Immature Granulocytes 0.04 0.00 - 0.07 K/uL    Comment: Performed at [facility], [address] [place], [place] [address]  Basic metabolic panel     Status: Abnormal   Collection Time: 04/07/19  5:11 AM  Result Value Ref Range   Sodium 143 135 - 145 mmol/L   Potassium 4.4 3.5 - 5.1 mmol/L   Chloride 104 98 - 111 mmol/L   CO2 28 22 - 32 mmol/L   Glucose, Bld 135 (H) 70 - 99 mg/dL    Comment: Glucose reference range applies only to samples taken after fasting for at least 8 hours.   BUN 57 (H) 8 - 23 mg/dL   Creatinine, Ser 1.81 (H) 0.44 - 1.00 mg/dL   Calcium 9.4 8.9 - 10.3 mg/dL   GFR calc non Af Amer 26 (L) >60 mL/min   GFR calc Af Amer 30 (L) >60 mL/min   Anion gap 11 5 - 15    Comment: Performed at [facility] [address] [place], [place] [address]    CT Chest Wo Contrast  Result Date: 04/07/2019 CLINICAL DATA:  Tripped and fell, struck left-sided of chest on a wooden chair, pain worse with inspiration, history of pneumonia, hypoxic,  hypertension, GERD and COPD EXAM: CT  CHEST WITHOUT CONTRAST TECHNIQUE: Multidetector CT imaging of the chest was performed following the standard protocol without IV contrast. COMPARISON:  CT chest 01/12/2014 FINDINGS: Cardiovascular: Mild cardiomegaly with predominantly right heart enlargement. Coronary artery calcifications are present. Some dense calcifications noted at the level of mitral annulus as well. No pericardial effusion. Central pulmonary arteries are borderline enlarged. Luminal evaluation precluded in the absence of contrast. Atherosclerotic plaque within the normal caliber aorta. Shared origin of the brachiocephalic and left common carotid artery. Minimal plaque in the proximal great vessels. Mediastinum/Nodes: Scattered low-attenuation prominent though nonenlarged mediastinal nodes are similar to comparison 01/12/2014. No axillary adenopathy. Hilar nodes difficult to assess in the absence of contrast media. No acute abnormality of the trachea or esophagus. Thyroid gland and thoracic inlet are unremarkable. Lungs/Pleura: There are diffuse subpleural reticular changes, most notably within the lung apices. Some of these some interlobular septal thickening is present as well with peribronchovascular cuffing. No focal consolidative opacity is seen. No left pneumothorax. Trace left pleural thickening, may reflect small amount of pleural fluid. Upper Abdomen: Few calcified gallstones present within the otherwise normal gallbladder. No acute abnormalities present in the visualized portions of the upper abdomen. No evidence of direct splenic injury or perisplenic hemorrhage Musculoskeletal: Minimally displaced acute lateral left seventh through tenth rib fractures with additional segmental posterior fractures of the eighth through tenth ribs. Several additional remote left-sided rib fractures are noted as well. IMPRESSION: 1. Acute minimally displaced left seventh through tenth rib fractures which are  segmental from the eighth through tenth ribs with fracture seen both laterally and posteriorly. Additional chronic left-sided rib fractures noted as well. Given contiguous segmental fractures, correlate for flail chest morphology. 2. Trace left pleural thickening may reflect small amount of pleural fluid. No pneumothorax. 3. Apical reticular features suggest some component of chronic interstitial disease likely with superimposed interstitial edematous change. 4. 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 5. Coronary artery calcifications. 6. Cholelithiasis. 7. Aortic Atherosclerosis (ICD10-I70.0). Electronically Signed   By: Lovena Le M.D.   On: 04/07/2019 05:50    ROS - all of the below systems have been reviewed with the patient and positives are indicated with bold text General: chills, fever or night sweats Eyes: blurry vision or double vision ENT: epistaxis or sore throat Allergy/Immunology: itchy/watery eyes or nasal congestion Hematologic/Lymphatic: bleeding problems, blood clots or swollen lymph nodes Endocrine: temperature intolerance or unexpected weight changes Breast: new or changing breast lumps or nipple discharge Resp: cough, shortness of breath, or wheezing CV: chest pain or dyspnea on exertion GI: as per HPI GU: dysuria, trouble voiding, or hematuria MSK: joint pain or joint stiffness Neuro: TIA or stroke symptoms Derm: pruritus and skin lesion changes Psych: anxiety and depression  PE Blood pressure 126/60, pulse 87, temperature (!) 96.8 F (36 C), temperature source Temporal, resp. rate 19, height 5\' 4"  (1.626 m), weight 84.4 kg, SpO2 100 %. Constitutional: NAD; conversant; no deformities Eyes: Moist conjunctiva; no lid lag; anicteric; PERRL Neck: Trachea midline; no thyromegaly Lungs: Normal respiratory effort; no tactile fremitus CV: RRR; no palpable thrills; pitting edema to mid calf bilaterally with venous stasis  changes GI: Abd soft, nontender, nondistended; no palpable hepatosplenomegaly MSK: Normal range of motion of extremities; no clubbing/cyanosis; ecchymoses to left chest wall and left shin Psychiatric: Appropriate affect; alert and oriented x3 Lymphatic: No palpable cervical or axillary lymphadenopathy  Results for orders placed or performed during the hospital encounter of 04/07/19 (from the past 48 hour(s))  CBC with Differential/Platelet     Status: Abnormal   Collection Time: 04/07/19  5:11 AM  Result Value Ref Range   WBC 6.9 4.0 - 10.5 K/uL   RBC 3.51 (L) 3.87 - 5.11 MIL/uL   Hemoglobin 11.4 (L) 12.0 - 15.0 g/dL   HCT 36.6 36.0 - 46.0 %   MCV 104.3 (H) 80.0 - 100.0 fL   MCH 32.5 26.0 - 34.0 pg   MCHC 31.1 30.0 - 36.0 g/dL   RDW 13.2 11.5 - 15.5 %   Platelets 95 (L) 150 - 400 K/uL    Comment: REPEATED TO VERIFY PLATELET COUNT CONFIRMED BY SMEAR Immature Platelet Fraction may be clinically indicated, consider ordering this additional test [id number]    nRBC 0.0 0.0 - 0.2 %   Neutrophils Relative % 72 %   Neutro Abs 5.0 1.7 - 7.7 K/uL   Lymphocytes Relative 14 %   Lymphs Abs 1.0 0.7 - 4.0 K/uL   Monocytes Relative 10 %   Monocytes Absolute 0.7 0.1 - 1.0 K/uL   Eosinophils Relative 3 %   Eosinophils Absolute 0.2 0.0 - 0.5 K/uL   Basophils Relative 0 %   Basophils Absolute 0.0 0.0 - 0.1 K/uL   Immature Granulocytes 1 %   Abs Immature Granulocytes 0.04 0.00 - 0.07 K/uL    Comment: Performed at [facility], [address], [place], [place] [address]  Basic metabolic panel     Status: Abnormal   Collection Time: 04/07/19  5:11 AM  Result Value Ref Range   Sodium 143 135 - 145 mmol/L   Potassium 4.4 3.5 - 5.1 mmol/L   Chloride 104 98 - 111 mmol/L   CO2 28 22 - 32 mmol/L   Glucose, Bld 135 (H) 70 - 99 mg/dL    Comment: Glucose reference range applies only to samples taken after fasting for at least 8 hours.   BUN 57 (H) 8 - 23 mg/dL   Creatinine, Ser 1.81 (H) 0.44 -  1.00 mg/dL   Calcium 9.4 8.9 - 10.3 mg/dL   GFR calc non Af Amer 26 (L) >60 mL/min   GFR calc Af Amer 30 (L) >60 mL/min   Anion gap 11 5 - 15    Comment: Performed at [facility] [address] [place], [place] [address]    CT Chest Wo Contrast  Result Date: 04/07/2019 CLINICAL DATA:  Tripped and fell, struck left-sided of chest on a wooden chair, pain worse with inspiration, history of pneumonia, hypoxic, hypertension, GERD and COPD EXAM: CT CHEST WITHOUT CONTRAST TECHNIQUE: Multidetector CT imaging of the chest was performed following the standard protocol without IV contrast. COMPARISON:  CT chest 01/12/2014 FINDINGS: Cardiovascular: Mild cardiomegaly with predominantly right heart enlargement. Coronary artery calcifications are present. Some dense calcifications noted at the level of mitral annulus as well. No pericardial effusion. Central pulmonary arteries are borderline enlarged. Luminal evaluation precluded in the absence of contrast. Atherosclerotic plaque within the normal caliber aorta. Shared origin of the brachiocephalic and left common carotid artery. Minimal plaque in the proximal great vessels. Mediastinum/Nodes: Scattered low-attenuation prominent though nonenlarged mediastinal nodes are similar to comparison 01/12/2014. No axillary adenopathy. Hilar nodes difficult to assess in the absence of contrast media. No acute abnormality of the trachea or esophagus. Thyroid gland and thoracic inlet are unremarkable. Lungs/Pleura: There are diffuse subpleural reticular changes, most notably within the lung apices. Some of these some interlobular septal thickening is present as well with peribronchovascular cuffing. No focal  consolidative opacity is seen. No left pneumothorax. Trace left pleural thickening, may reflect small amount of pleural fluid. Upper Abdomen: Few calcified gallstones present within the otherwise normal gallbladder. No acute abnormalities present in the visualized  portions of the upper abdomen. No evidence of direct splenic injury or perisplenic hemorrhage Musculoskeletal: Minimally displaced acute lateral left seventh through tenth rib fractures with additional segmental posterior fractures of the eighth through tenth ribs. Several additional remote left-sided rib fractures are noted as well. IMPRESSION: 1. Acute minimally displaced left seventh through tenth rib fractures which are segmental from the eighth through tenth ribs with fracture seen both laterally and posteriorly. Additional chronic left-sided rib fractures noted as well. Given contiguous segmental fractures, correlate for flail chest morphology. 2. Trace left pleural thickening may reflect small amount of pleural fluid. No pneumothorax. 3. Apical reticular features suggest some component of chronic interstitial disease likely with superimposed interstitial edematous change. 4. 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 5. Coronary artery calcifications. 6. Cholelithiasis. 7. Aortic Atherosclerosis (ICD10-I70.0). Electronically Signed   By: Lovena Le M.D.   On: 04/07/2019 05:50     A/P: [name] is an 79 y.o. female with HTN, HLD, GERD, CHF, COPD, CKD3, gout, here following fall from standing, striking chair  L Rib fx 7-10 with 8-10 possible flail: agree with admission for pain control, pulmonary toilet with incentive spirometry to monitor closely. Tylenol, robaxin, roxicodone, prn dilaudid. -We will follow with you  Sharon Mt. Dema Severin, M.D. [facility] Use [email or web address] to contact on call provider

## 2019-04-07 NOTE — ED Notes (Signed)
Pt given water per Dr. Cardama 

## 2019-04-07 NOTE — ED Notes (Signed)
Patient transported to CT 

## 2019-04-07 NOTE — Plan of Care (Signed)
  Problem: Education: Goal: Knowledge of General Education information will improve Description: Including pain rating scale, medication(s)/side effects and non-pharmacologic comfort measures Outcome: Progressing   Problem: Clinical Measurements: Goal: Respiratory complications will improve Outcome: Progressing Note: On chronic 2L O2   Problem: Nutrition: Goal: Adequate nutrition will be maintained Outcome: Progressing   Problem: Coping: Goal: Level of anxiety will decrease Outcome: Progressing   Problem: Elimination: Goal: Will not experience complications related to urinary retention Outcome: Progressing   Problem: Pain Managment: Goal: General experience of comfort will improve Outcome: Progressing Note: Still complaining of left rib pain

## 2019-04-07 NOTE — ED Triage Notes (Signed)
Pt arrives via [facility] from home, per their report, the pt lost her footing and sustained a mechanical fall, striking her left side (ribs and back) on the seat of a wooden chair. C/o pain on the left side without deformity, increased pain to palpation and position changes.  En route, VSS, pt is on chronic 2L o2@home .

## 2019-04-07 NOTE — ED Triage Notes (Signed)
Pt says about an hour ago she tripped and fell, striking her left side (bruising noted to area), pain worse to inspiration, pt taking shallow breaths. She denied any dizziness prior to fall, denies use of blood thinners.

## 2019-04-08 DIAGNOSIS — M109 Gout, unspecified: Secondary | ICD-10-CM

## 2019-04-08 DIAGNOSIS — E118 Type 2 diabetes mellitus with unspecified complications: Secondary | ICD-10-CM

## 2019-04-08 LAB — CBC
HCT: 30.7 % — ABNORMAL LOW (ref 36.0–46.0)
Hemoglobin: 9.4 g/dL — ABNORMAL LOW (ref 12.0–15.0)
MCH: 31.5 pg (ref 26.0–34.0)
MCHC: 30.6 g/dL (ref 30.0–36.0)
MCV: 103 fL — ABNORMAL HIGH (ref 80.0–100.0)
Platelets: 78 10*3/uL — ABNORMAL LOW (ref 150–400)
RBC: 2.98 MIL/uL — ABNORMAL LOW (ref 3.87–5.11)
RDW: 13.1 % (ref 11.5–15.5)
WBC: 5.4 10*3/uL (ref 4.0–10.5)
nRBC: 0 % (ref 0.0–0.2)

## 2019-04-08 LAB — BASIC METABOLIC PANEL
Anion gap: 8 (ref 5–15)
BUN: 49 mg/dL — ABNORMAL HIGH (ref 8–23)
CO2: 28 mmol/L (ref 22–32)
Calcium: 9.2 mg/dL (ref 8.9–10.3)
Chloride: 108 mmol/L (ref 98–111)
Creatinine, Ser: 1.66 mg/dL — ABNORMAL HIGH (ref 0.44–1.00)
GFR calc Af Amer: 34 mL/min — ABNORMAL LOW (ref >60)
GFR calc non Af Amer: 29 mL/min — ABNORMAL LOW (ref >60)
Glucose, Bld: 127 mg/dL — ABNORMAL HIGH (ref 70–99)
Potassium: 4.8 mmol/L (ref 3.5–5.1)
Sodium: 144 mmol/L (ref 135–145)

## 2019-04-08 MED ORDER — POLYETHYLENE GLYCOL 3350 17 G PO PACK
17.0000 g | PACK | Freq: Every day | ORAL | Status: DC
  Administered 2019-04-10 – 2019-04-14 (×3): 17 g via ORAL
  Filled 2019-04-08 (×6): qty 1

## 2019-04-08 NOTE — Progress Notes (Signed)
PROGRESS NOTE    [name]  YQM:[id number] DOB: 07-May-1940 DOA: 04/07/2019 PCP: Mosie Lukes, MD    Brief Narrative:  [name] is a 79 year old Caucasian female with past medical history remarkable for PMR, HTN, HLD, gout, hypercalcemia, history of DVT, chronic diastolic congestive heart failure, COPD on 2 L [place] baseline, CKD stage III who presented with mechanical fall and rib pain.  States was getting up to go to the restroom and went to get water in the kitchen and slipped and fell on the kitchen floor hitting a chair on the way down.  Patient sustained pain to her left ribs and back.  Presenting with severe pain with movement.  Denies syncopal episode.  In ED was noted to have significant rib fractures on the left, trauma surgery consulted.  Medicine consulted for admission for pain control and mobility assessment.   Assessment & Plan:   Principal Problem:   Rib fracture Active Problems:   Hyperlipidemia, mixed   Gout   Essential hypertension   Diabetes type 2, controlled ([place])   Fall at home, initial encounter   Chronic respiratory failure with hypoxia and hypercapnia (HCC)   Polymyalgia rheumatica (HCC)   Chronic combined systolic and diastolic CHF (congestive heart failure) (HCC)   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HCC)   Acute Left 7-10 contiguous rib fractures Evidence of chronic rib fractures Patient presenting to the ED with chest wall pain following mechanical fall resulting in left-sided rib fractures.  Rib fractures contiguous with concern of flail chest; which is not apparent at this time. --Trauma surgery following, appreciate assistance --Tylenol 1 g every 6 hours, lidocaine patch, Robaxin 750 mg PO QID --oxycodone 5-'10mg'$  q6h prn --Dilaudid 0.'5mg'$  IV q2h prn for severe breakthrough pain --Pulmonary toilet with incentive spirometry, flutter valve --Needs mobility assessment/ambulation, pending PT/OT evaluation  Chronic diastolic congestive heart  failure; currently compensated TTE 10/2017 with preserved EF and grade 1 diastolic dysfunction.  On furosemide '20mg'$  PO daily at home.  --Hold home furosemide for now  Chronic hypoxic respiratory failure COPD on 2L [place] baseline --Continue Symbicort, Claritin, Singulair --albuterol nebs prn --Currently oxygenating well on 2 L nasal cannula  CKD stage IV Baseline GFR last year 23-29 with creatinine 1.7 - 2.0 over the past year. --Creatinine 1.66 today --Avoid nephrotoxins, renally dose all medications  HLD: Continue statin  Essential hypertension: Continue metoprolol succinate 75 mg p.o. daily  Depression: Continue sertraline 100 mg p.o. daily  GERD: Continue Protonix 40 mg p.o. twice daily  Gout: Continue allopurinol 20 mg p.o. daily   DVT prophylaxis: Lovenox Code Status: Full code Family Communication: Discussed with patient extensively at bedside Disposition Plan:      Patient is from: Home alone     Anticipated Disposition:  To be determined, pending PT OT consultation, may need SNF versus home health     Barriers to discharge or conditions that needs to be met prior to discharge: Awaiting therapy evaluation, optimization of pain control, sign off from a trauma surgery  Consultants:   Trauma surgery  Procedures:   None  Antimicrobials:   None   Subjective: Patient seen and examined bedside, resting comfortably.  Continues with left-sided rib pain; especially with deep breaths.  Has not moved out of her bed yet.  Waiting therapy evaluation.  Pain slightly better controlled with medication.  No other complaints or concerns at this time.  Denies headache, no fever/chills/night sweats, no nausea/vomiting/diarrhea, no chest pain, palpitations, no shortness of breath, no  abdominal pain, no weakness, no fatigue, no paresthesias.  No acute events overnight per nursing staff.  Objective: Vitals:   04/07/19 2050 04/08/19 0454 04/08/19 0819 04/08/19 0853  BP:  120/66 (!)  109/54   Pulse: 88 85 88   Resp: '16 16 17   '$ Temp:  98.2 F (36.8 C) 98.8 F (37.1 C)   TempSrc:  Oral Oral   SpO2: 98% 97% 98% 97%  Weight:      Height:        Intake/Output Summary (Last 24 hours) at 04/08/2019 1014 Last data filed at 04/08/2019 0454 Gross per 24 hour  Intake --  Output 800 ml  Net -800 ml   Filed Weights   04/07/19 0422  Weight: 84.4 kg    Examination:  General exam: Appears calm and comfortable  Respiratory system: Clear to auscultation. Respiratory effort normal.  Oxygenating 97% on 2L [place] which is her baseline Cardiovascular system: S1 & S2 heard, RRR. No JVD, murmurs, rubs, gallops or clicks. No pedal edema. Gastrointestinal system: Abdomen is nondistended, soft and nontender. No organomegaly or masses felt. Normal bowel sounds heard. Central nervous system: Alert and oriented. No focal neurological deficits. Extremities: Symmetric 5 x 5 power. Skin: No rashes, lesions or ulcers Psychiatry: Judgement and insight appear normal. Mood & affect appropriate.     Data Reviewed: I have personally reviewed following labs and imaging studies  CBC: Recent Labs  Lab 04/06/19 1105 04/07/19 0511 04/08/19 0340  WBC 5.9 6.9 5.4  NEUTROABS 4.0 5.0  --   HGB 11.2* 11.4* 9.4*  HCT 35.5* 36.6 30.7*  MCV 102.0* 104.3* 103.0*  PLT 108* 95* 78*   Basic Metabolic Panel: Recent Labs  Lab 04/06/19 1105 04/07/19 0511 04/08/19 0340  NA 141 143 144  K 4.7 4.4 4.8  CL 102 104 108  CO2 32 28 28  GLUCOSE 139* 135* 127*  BUN 55* 57* 49*  CREATININE 2.01* 1.81* 1.66*  CALCIUM 9.7 9.4 9.2   GFR: Estimated Creatinine Clearance: 29.4 mL/min (A) (by C-G formula based on SCr of 1.66 mg/dL (H)). Liver Function Tests: Recent Labs  Lab 04/06/19 1105  AST 12*  ALT 8  ALKPHOS 44  BILITOT 0.4  PROT 6.7  ALBUMIN 4.5   No results for input(s): LIPASE, AMYLASE in the last 168 hours. No results for input(s): AMMONIA in the last 168 hours. Coagulation Profile: No  results for input(s): INR, PROTIME in the last 168 hours. Cardiac Enzymes: No results for input(s): CKTOTAL, CKMB, CKMBINDEX, TROPONINI in the last 168 hours. BNP (last 3 results) No results for input(s): PROBNP in the last 8760 hours. HbA1C: No results for input(s): HGBA1C in the last 72 hours. CBG: No results for input(s): GLUCAP in the last 168 hours. Lipid Profile: No results for input(s): CHOL, HDL, LDLCALC, TRIG, CHOLHDL, LDLDIRECT in the last 72 hours. Thyroid Function Tests: No results for input(s): TSH, T4TOTAL, FREET4, T3FREE, THYROIDAB in the last 72 hours. Anemia Panel: Recent Labs    04/06/19 1105  FERRITIN 475*  TIBC 248  IRON 113  RETICCTPCT 0.9   Sepsis Labs: No results for input(s): PROCALCITON, LATICACIDVEN in the last 168 hours.  Recent Results (from the past 240 hour(s))  SARS CORONAVIRUS 2 (TAT 6-24 HRS) Nasopharyngeal Nasopharyngeal Swab     Status: None   Collection Time: 04/07/19  6:46 AM   Specimen: Nasopharyngeal Swab  Result Value Ref Range Status   SARS Coronavirus 2 NEGATIVE NEGATIVE Final    Comment: (NOTE) SARS-CoV-2  target nucleic acids are NOT DETECTED. The SARS-CoV-2 RNA is generally detectable in upper and lower respiratory specimens during the acute phase of infection. Negative results do not preclude SARS-CoV-2 infection, do not rule out co-infections with other pathogens, and should not be used as the sole basis for treatment or other patient management decisions. Negative results must be combined with clinical observations, patient history, and epidemiological information. The expected result is Negative. Fact Sheet for Patients: SugarRoll.be Fact Sheet for Healthcare Providers: https://www.woods-mathews.com/ This test is not yet approved or cleared by the [place] FDA and  has been authorized for detection and/or diagnosis of SARS-CoV-2 by FDA under an Emergency Use Authorization (EUA).  This EUA will remain  in effect (meaning this test can be used) for the duration of the COVID-19 declaration under Section 56 4(b)(1) of the Act, 21 U.S.C. section 360bbb-3(b)(1), unless the authorization is terminated or revoked sooner. Performed at [facility], [place] [address] [place], [place] [address]          Radiology Studies: CT Chest Wo Contrast  Result Date: 04/07/2019 CLINICAL DATA:  Tripped and fell, struck left-sided of chest on a wooden chair, pain worse with inspiration, history of pneumonia, hypoxic, hypertension, GERD and COPD EXAM: CT CHEST WITHOUT CONTRAST TECHNIQUE: Multidetector CT imaging of the chest was performed following the standard protocol without IV contrast. COMPARISON:  CT chest 01/12/2014 FINDINGS: Cardiovascular: Mild cardiomegaly with predominantly right heart enlargement. Coronary artery calcifications are present. Some dense calcifications noted at the level of mitral annulus as well. No pericardial effusion. Central pulmonary arteries are borderline enlarged. Luminal evaluation precluded in the absence of contrast. Atherosclerotic plaque within the normal caliber aorta. Shared origin of the brachiocephalic and left common carotid artery. Minimal plaque in the proximal great vessels. Mediastinum/Nodes: Scattered low-attenuation prominent though nonenlarged mediastinal nodes are similar to comparison 01/12/2014. No axillary adenopathy. Hilar nodes difficult to assess in the absence of contrast media. No acute abnormality of the trachea or esophagus. Thyroid gland and thoracic inlet are unremarkable. Lungs/Pleura: There are diffuse subpleural reticular changes, most notably within the lung apices. Some of these some interlobular septal thickening is present as well with peribronchovascular cuffing. No focal consolidative opacity is seen. No left pneumothorax. Trace left pleural thickening, may reflect small amount of pleural fluid. Upper Abdomen: Few  calcified gallstones present within the otherwise normal gallbladder. No acute abnormalities present in the visualized portions of the upper abdomen. No evidence of direct splenic injury or perisplenic hemorrhage Musculoskeletal: Minimally displaced acute lateral left seventh through tenth rib fractures with additional segmental posterior fractures of the eighth through tenth ribs. Several additional remote left-sided rib fractures are noted as well. IMPRESSION: 1. Acute minimally displaced left seventh through tenth rib fractures which are segmental from the eighth through tenth ribs with fracture seen both laterally and posteriorly. Additional chronic left-sided rib fractures noted as well. Given contiguous segmental fractures, correlate for flail chest morphology. 2. Trace left pleural thickening may reflect small amount of pleural fluid. No pneumothorax. 3. Apical reticular features suggest some component of chronic interstitial disease likely with superimposed interstitial edematous change. 4. 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 5. Coronary artery calcifications. 6. Cholelithiasis. 7. Aortic Atherosclerosis (ICD10-I70.0). Electronically Signed   By: Lovena Le M.D.   On: 04/07/2019 05:50        Scheduled Meds: . acetaminophen  1,000 mg Oral Q6H  . allopurinol  200 mg Oral Daily  . docusate sodium  100 mg Oral BID  . enoxaparin (LOVENOX) injection  30 mg Subcutaneous Q24H  . lidocaine  2 patch Transdermal Q24H  . methocarbamol  750 mg Oral QID  . metoprolol succinate  75 mg Oral Daily  . mometasone-formoterol  2 puff Inhalation BID  . montelukast  10 mg Oral QHS  . pantoprazole  40 mg Oral BID  . polyethylene glycol  17 g Oral Daily  . pravastatin  40 mg Oral QPM  . sertraline  100 mg Oral Daily  . sodium chloride flush  3 mL Intravenous Q12H   Continuous Infusions:   LOS: 1 day    Time spent: 35 minutes spent on chart review,  discussion with nursing staff, consultants, updating family and interview/physical exam; more than 50% of that time was spent in counseling and/or coordination of care.    Elisabella Hacker J [place] DO [facility] Available via Epic secure chat 7am-7pm After these hours, please refer to coverage provider listed on [email or web address] 04/08/2019, 10:14 AM

## 2019-04-08 NOTE — TOC Initial Note (Signed)
Transition of Care [facility]) - Initial/Assessment Note    Patient Details  Name: [name] MRN: [id number] Date of Birth: 1940-10-11  Transition of Care [facility]) CM/SW Contact:    Sharin Mons, RN Phone Number: [phone] 04/08/2019, 3:05 PM  Clinical Narrative:        Presents s/p fall in kitchen hitting a chair with left 7-10 rib fx. PMhx: HTN, HLD, gout, CHF, COPD, CKD, depression, macular degeneration. From home alone. Supportive daughter, Pamala Hurry. PTA  Independent with ADL'S. DME; walker, oxygen. [facility] supplies pt with oxygen.   NCM shared PT/OT's  Evaluation/ recommendations for [facility] services/ Supervision/Assistance - 24 hour. Pt states daughter will ensure she has the supervision needed and she's agreeable to [facility] services. Choice list provided/ medicare HH star ratings. Pt requested NCM to discuss information with daughter.   NCM called patient's daughter Pamala Hurry and shared above conversation. Daughter selected [facility] for [facility] services. Referral made with [facility]/ [place]. HH orders for PT/OT , F2F will be needed from MD prior to d/c.  Expected Discharge Plan: [place] Barriers to Discharge: Continued Medical Work up   Patient Goals and CMS Choice     Choice offered to / list presented to : Patient  Expected Discharge Plan and Services Expected Discharge Plan: [place]   Discharge Planning Services: CM Consult Post Acute Care Choice: Home Health        Prior Living Arrangements/Services   Lives with:: Self Patient language and need for interpreter reviewed:: Yes Do you feel safe going back to the place where you live?: Yes      Need for Family Participation in Patient Care: Yes (Comment) Care giver support system in place?: Yes (comment)   Criminal Activity/Legal Involvement Pertinent to Current Situation/Hospitalization: No - Comment as needed  Activities of Daily Living Home Assistive Devices/Equipment: Eyeglasses,  Dentures (specify type), Oxygen, Walker (specify type) ADL Screening (condition at time of admission) Patient's cognitive ability adequate to safely complete daily activities?: No Is the patient deaf or have difficulty hearing?: No Does the patient have difficulty seeing, even when wearing glasses/contacts?: No Does the patient have difficulty concentrating, remembering, or making decisions?: No Patient able to express need for assistance with ADLs?: Yes Does the patient have difficulty dressing or bathing?: No Independently performs ADLs?: Yes (appropriate for developmental age) Does the patient have difficulty walking or climbing stairs?: Yes Weakness of Legs: Left Weakness of Arms/Hands: Left  Permission Sought/Granted   Permission granted to share information with : Yes, Verbal Permission Granted  Share Information with NAME: [name] (Daughter)[phone]           Emotional Assessment Appearance:: Appears stated age Attitude/Demeanor/Rapport: Engaged Affect (typically observed): Accepting Orientation: : Oriented to Self, Oriented to Place, Oriented to  Time, Oriented to Situation   Psych Involvement: No (comment)  Admission diagnosis:  Rib fracture [S22.39XA] Fall, initial encounter [W19.XXXA] Multiple fractures of ribs, left side, initial encounter for closed fracture [S22.42XA] Patient Active Problem List   Diagnosis Date Noted  . Rib fracture 04/07/2019  . Chronic asthma, mild persistent, uncomplicated 88/41/6606  . High serum vitamin B12 11/20/2017  . Subarachnoid hemorrhage ([place]) 11/05/2017  . Intracranial hemorrhage ([place]) 11/04/2017  . Syncope and collapse 11/04/2017  . Thrombocytopenia ([place]) 10/16/2017  . 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 Vitamin D deficiency 06/16/2017  . Hypernatremia 12/25/2016  . Nonintractable headache 10/26/2016  . Leukocytes in urine 10/26/2016  . Peripheral arterial disease ([place]) 10/01/2016  .  Renal  insufficiency 09/22/2016  . Abnormal TSH 09/22/2016  . 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[place]) 08/04/2016  . Heart murmur 04/25/2016  . Chronic combined systolic and diastolic CHF (congestive heart failure) ([place]) 04/07/2016  . Bronchitis 03/06/2016  . Polymyalgia rheumatica ([place]) 01/07/2016  . Pedal edema 08/28/2015  . Chronic respiratory failure with hypoxia and hypercapnia ([place]) 06/28/2015  . Macular degeneration 04/10/2015  . Fall at home, initial encounter 12/04/2014  . Hypercalcemia 10/15/2014  . Hypoxia 10/15/2014  . 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 Cervical cancer screening 02/28/2014  . Eustachian tube dysfunction 01/29/2014  . Medicare annual wellness visit, subsequent 12/04/2013  . Constipation 06/05/2013  . Shortness of breath 03/07/2013  . Tachycardia 02/23/2013  . Sun-damaged skin 10/24/2012  . Lower urinary tract infectious disease 10/24/2012  . Back pain 04/07/2011  . Allergic rhinitis 05/23/2009  . Gout 01/11/2008  . VARICOSE VEINS, LOWER EXTREMITIES 06/01/2007  . ADJ DISORDER WITH MIXED ANXIETY & DEPRESSED MOOD 03/25/2007  . Hyperlipidemia, mixed 12/29/2006  . Essential hypertension 12/29/2006  . Osteopenia 12/29/2006  . Abdominal pain 12/29/2006  . Diabetes type 2, controlled ([place]) 12/29/2006  . Asthma, moderate persistent 08/21/2006  . GERD 08/21/2006  . Osteoarthritis 08/21/2006  . Phlebitis and thrombophlebitis 08/21/2006   PCP:  Mosie Lukes, MD Pharmacy:   [facility] [address]), [place] [address] [place] [address] Phone: [phone] Fax: [phone]  [facility] - [place], [place] [address] Phone: [phone] Fax: [phone]  [place], [place] [address] Phone: [phone] Fax:  [phone]     Social Determinants of Health (SDOH) Interventions    Readmission Risk Interventions No flowsheet data found.

## 2019-04-08 NOTE — Evaluation (Signed)
Occupational Therapy Evaluation Patient Details Name: [name] MRN: [id number] DOB: Feb 13, 1940 Today's Date: 04/08/2019    History of Present Illness 79 yo admitted after fall in kitchen hitting a chair with left 7-10 rib fx. PMhx: HTN, HLD, gout, CHF, COPD, CKD, depression, macular degeneration   Clinical Impression   Pt admitted with above. She demonstrates the below listed deficits and will benefit from continued OT to maximize safety and independence with BADLs.  Pt limited by pain, generalized weakness, decreased activity tolerance, and impaired balance.  She currently requires set up assist - max A for ADLs.  She reports she lives alone, and endorses 2 recent falls.  She moves very slowly requiring significant time to complete all tasks.  She reports that her daughter can provide 24 hour assist at discharge.  IF this is the case, recommend HHOT and 3in1 commode.  If family unable to provide 24 hour assist initially, then she may require a higher level of care such as SNF.       Follow Up Recommendations  Home health OT;Supervision/Assistance - 24 hour    Equipment Recommendations  3 in 1 bedside commode    Recommendations for Other Services       Precautions / Restrictions Precautions Precautions: Fall      Mobility Bed Mobility Overal bed mobility: Needs Assistance Bed Mobility: Sit to Supine Rolling: Min assist Sidelying to sit: Mod assist   Sit to supine: Mod assist   General bed mobility comments: assist for LEs.  Requires increased time   Transfers Overall transfer level: Needs assistance   Transfers: Sit to/from Stand;Stand Pivot Transfers Sit to Stand: Min assist Stand pivot transfers: Min assist       General transfer comment: increased time and assist to boost into standing.      Balance Overall balance assessment: Needs assistance   Sitting balance-Leahy Scale: Fair Sitting balance - Comments: able to maintain static sitting with UE support  due to pain    Standing balance support: Single extremity supported Standing balance-Leahy Scale: Poor Standing balance comment: requires UE support                            ADL either performed or assessed with clinical judgement   ADL Overall ADL's : Needs assistance/impaired Eating/Feeding: Modified independent;Bed level;Sitting   Grooming: Wash/dry face;Wash/dry hands;Oral care;Set up;Sitting   Upper Body Bathing: Moderate assistance;Sitting Upper Body Bathing Details (indicate cue type and reason): limited by pain  Lower Body Bathing: Maximal assistance;Sit to/from stand Lower Body Bathing Details (indicate cue type and reason): limited by pain  Upper Body Dressing : Maximal assistance;Sitting   Lower Body Dressing: Maximal assistance;Sit to/from stand   Toilet Transfer: Minimal assistance;Stand-pivot;BSC;RW   Toileting- Clothing Manipulation and Hygiene: Minimal assistance;Sit to/from stand Toileting - Clothing Manipulation Details (indicate cue type and reason): assist with clothing manipulation      Functional mobility during ADLs: Minimal assistance;Rolling walker General ADL Comments: Pt limited by pain      Vision Baseline Vision/History: Wears glasses;Macular Degeneration Wears Glasses: At all times Patient Visual Report: No change from baseline       Perception     Praxis      Pertinent Vitals/Pain Pain Assessment: 0-10 Pain Score: 8  Pain Location: left chest/ribs  Pain Descriptors / Indicators: Aching;Guarding;Tender Pain Intervention(s): Monitored during session;Repositioned;Limited activity within patient's tolerance     Hand Dominance Right   Extremity/Trunk Assessment Upper Extremity Assessment  Upper Extremity Assessment: Generalized weakness;RUE deficits/detail;LUE deficits/detail RUE Deficits / Details: arthritic deformity noted bil. hands.  Pt tended to limit use of Rt UE.  She was leanin on this UE so unsure if she was  hesitant to shift her weight.  In supine, ROM WFL at shoulder  LUE Deficits / Details: arthritic deformity noted bil. hands    Lower Extremity Assessment Lower Extremity Assessment: Defer to PT evaluation   Cervical / Trunk Assessment Cervical / Trunk Assessment: Kyphotic   Communication Communication Communication: HOH   Cognition Arousal/Alertness: Awake/alert Behavior During Therapy: Flat affect;Anxious Overall Cognitive Status: No family/caregiver present to determine baseline cognitive functioning                                 General Comments: Pt ordered from menu, but required mod cues/prompting to complete task as she would self distract and have difficulty redirecting back to task.  She required min cues for organizational deficits and for problem solving.  Unsure if she may have a component of hearing loss, or if pain, or meds may be impacting her function    General Comments  pt reprots 2 recent falls    Exercises     Shoulder Instructions      Home Living Family/patient expects to be discharged to:: Private residence Living Arrangements: Alone Available Help at Discharge: Family;Available 24 hours/day Type of Home: House Home Access: Ramped entrance     Home Layout: One level     Bathroom Shower/Tub: Occupational psychologist: Handicapped height     Home Equipment: Environmental consultant - 4 wheels;Cane - single point;Shower seat - built in   Additional Comments: Pt reports her shower seat is a corner seat that is small and likely not user friendly       Prior Functioning/Environment Level of Independence: Independent        Comments: Pt endorses a fall in January         OT Problem List: Decreased strength;Decreased activity tolerance;Impaired balance (sitting and/or standing);Decreased cognition;Decreased safety awareness;Decreased knowledge of use of DME or AE;Pain      OT Treatment/Interventions: Self-care/ADL training;Energy  conservation;DME and/or AE instruction;Therapeutic activities;Cognitive remediation/compensation;Patient/family education;Balance training;Therapeutic exercise    OT Goals(Current goals can be found in the care plan section) Acute Rehab OT Goals Patient Stated Goal: to have less pain  OT Goal Formulation: With patient Time For Goal Achievement: 04/22/19 Potential to Achieve Goals: Good ADL Goals Pt Will Perform Grooming: with min guard assist;standing Pt Will Perform Upper Body Bathing: with set-up;with supervision;sitting Pt Will Perform Lower Body Bathing: with min guard assist;sit to/from stand;with adaptive equipment Pt Will Perform Upper Body Dressing: with set-up;sitting Pt Will Perform Lower Body Dressing: with min guard assist;sit to/from stand Pt Will Transfer to Toilet: with min guard assist;ambulating;regular height toilet;bedside commode;grab bars Pt Will Perform Toileting - Clothing Manipulation and hygiene: with min guard assist;sit to/from stand Pt Will Perform Tub/Shower Transfer: Shower transfer;with min guard assist;ambulating;3 in 1;rolling walker  OT Frequency: Min 2X/week   Barriers to D/C:    Pt reports her daughter can provide 24 hour assist        Co-evaluation              AM-PAC OT "6 Clicks" Daily Activity     Outcome Measure Help from another person eating meals?: None Help from another person taking care of personal grooming?: A Little Help from another  person toileting, which includes using toliet, bedpan, or urinal?: A Little Help from another person bathing (including washing, rinsing, drying)?: A Lot Help from another person to put on and taking off regular upper body clothing?: A Lot Help from another person to put on and taking off regular lower body clothing?: A Lot 6 Click Score: 16   End of Session Equipment Utilized During Treatment: Gait belt;Rolling walker;Oxygen Nurse Communication: Mobility status  Activity Tolerance: Patient  limited by pain Patient left: in bed;with call bell/phone within reach;with bed alarm set  OT Visit Diagnosis: Unsteadiness on feet (R26.81);Pain Pain - Right/Left: Left Pain - part of body: (ribs )                Time: 8127-5170 OT Time Calculation (min): 38 min Charges:  OT General Charges $OT Visit: 1 Visit OT Evaluation $OT Eval Moderate Complexity: 1 Mod OT Treatments $Self Care/Home Management : 23-37 mins  Nilsa Nutting., OTR/L Acute Rehabilitation Services Pager [phone] Office [place], [place] 04/08/2019, 1:49 PM

## 2019-04-08 NOTE — Progress Notes (Signed)
Patient ID: [name], female   DOB: 1940-08-05, 79 y.o.   MRN: [id number]       Subjective: Patient is comfortable this morning.  Tolerating a regular diet with no issues.  Some flatus, no BM.  Hasn't gotten out of bed yet since admission.  Pulled 750 on IS.  ROS: See above, otherwise other systems negative  Objective: Vital signs in last 24 hours: Temp:  [98.1 F (36.7 C)-98.8 F (37.1 C)] 98.8 F (37.1 C) (03/19 0819) Pulse Rate:  [71-88] 88 (03/19 0819) Resp:  [13-26] 17 (03/19 0819) BP: (95-125)/(42-78) 109/54 (03/19 0819) SpO2:  [97 %-100 %] 97 % (03/19 0853) Last BM Date: 04/06/19  Intake/Output from previous day: 03/18 0701 - 03/19 0700 In: -  Out: 800 [Urine:800] Intake/Output this shift: No intake/output data recorded.  PE: Gen: NAD HEENT; PERRL Heart: regular Lungs: CTAB, tender on left chest wall, on O2 at baseline Abd: soft, NT, ND MS: MAE Psych: A&Ox3  Lab Results:  Recent Labs    04/07/19 0511 04/08/19 0340  WBC 6.9 5.4  HGB 11.4* 9.4*  HCT 36.6 30.7*  PLT 95* 78*   BMET Recent Labs    04/07/19 0511 04/08/19 0340  NA 143 144  K 4.4 4.8  CL 104 108  CO2 28 28  GLUCOSE 135* 127*  BUN 57* 49*  CREATININE 1.81* 1.66*  CALCIUM 9.4 9.2   PT/INR No results for input(s): LABPROT, INR in the last 72 hours. CMP     Component Value Date/Time   NA 144 04/08/2019 0340   NA 143 07/21/2017 1501   K 4.8 04/08/2019 0340   CL 108 04/08/2019 0340   CO2 28 04/08/2019 0340   GLUCOSE 127 (H) 04/08/2019 0340   GLUCOSE 102 (H) 01/27/2006 1037   BUN 49 (H) 04/08/2019 0340   BUN 41 (H) 07/21/2017 1501   CREATININE 1.66 (H) 04/08/2019 0340   CREATININE 2.01 (H) 04/06/2019 1105   CREATININE 1.36 (H) 08/23/2013 1134   CALCIUM 9.2 04/08/2019 0340   PROT 6.7 04/06/2019 1105   PROT 6.6 02/01/2018 1256   ALBUMIN 4.5 04/06/2019 1105   ALBUMIN 3.9 02/01/2018 1256   AST 12 (L) 04/06/2019 1105   ALT 8 04/06/2019 1105   ALKPHOS 44 04/06/2019 1105    BILITOT 0.4 04/06/2019 1105   GFRNONAA 29 (L) 04/08/2019 0340   GFRNONAA 23 (L) 04/06/2019 1105   GFRAA 34 (L) 04/08/2019 0340   GFRAA 27 (L) 04/06/2019 1105   Lipase  No results found for: LIPASE     Studies/Results: CT Chest Wo Contrast  Result Date: 04/07/2019 CLINICAL DATA:  Tripped and fell, struck left-sided of chest on a wooden chair, pain worse with inspiration, history of pneumonia, hypoxic, hypertension, GERD and COPD EXAM: CT CHEST WITHOUT CONTRAST TECHNIQUE: Multidetector CT imaging of the chest was performed following the standard protocol without IV contrast. COMPARISON:  CT chest 01/12/2014 FINDINGS: Cardiovascular: Mild cardiomegaly with predominantly right heart enlargement. Coronary artery calcifications are present. Some dense calcifications noted at the level of mitral annulus as well. No pericardial effusion. Central pulmonary arteries are borderline enlarged. Luminal evaluation precluded in the absence of contrast. Atherosclerotic plaque within the normal caliber aorta. Shared origin of the brachiocephalic and left common carotid artery. Minimal plaque in the proximal great vessels. Mediastinum/Nodes: Scattered low-attenuation prominent though nonenlarged mediastinal nodes are similar to comparison 01/12/2014. No axillary adenopathy. Hilar nodes difficult to assess in the absence of contrast media. No acute abnormality of the trachea or  esophagus. Thyroid gland and thoracic inlet are unremarkable. Lungs/Pleura: There are diffuse subpleural reticular changes, most notably within the lung apices. Some of these some interlobular septal thickening is present as well with peribronchovascular cuffing. No focal consolidative opacity is seen. No left pneumothorax. Trace left pleural thickening, may reflect small amount of pleural fluid. Upper Abdomen: Few calcified gallstones present within the otherwise normal gallbladder. No acute abnormalities present in the visualized portions of the  upper abdomen. No evidence of direct splenic injury or perisplenic hemorrhage Musculoskeletal: Minimally displaced acute lateral left seventh through tenth rib fractures with additional segmental posterior fractures of the eighth through tenth ribs. Several additional remote left-sided rib fractures are noted as well. IMPRESSION: 1. Acute minimally displaced left seventh through tenth rib fractures which are segmental from the eighth through tenth ribs with fracture seen both laterally and posteriorly. Additional chronic left-sided rib fractures noted as well. Given contiguous segmental fractures, correlate for flail chest morphology. 2. Trace left pleural thickening may reflect small amount of pleural fluid. No pneumothorax. 3. Apical reticular features suggest some component of chronic interstitial disease likely with superimposed interstitial edematous change. 4. 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 5. Coronary artery calcifications. 6. Cholelithiasis. 7. Aortic Atherosclerosis (ICD10-I70.0). Electronically Signed   By: Lovena Le M.D.   On: 04/07/2019 05:50    Anti-infectives: Anti-infectives (From admission, onward)   None       Assessment/Plan GLF L Rib fx 7-10 with 8-10 possible flail - PT/OT, pain control, IS, needs to mobilize more. DC purewick and at least get a commode to the bedside HTN - per medicine HLD GERD - protonix CHF - per medicine COPD - on chronic O2 at home CKD3 FEN - regular diet VTE - lovenox ID - none   LOS: 1 day    Henreitta Cea , [facility] Surgery 04/08/2019, 9:21 AM Please see Amion for pager number during day hours 7:00am-4:30pm or 7:00am -11:30am on weekends

## 2019-04-08 NOTE — Evaluation (Signed)
Physical Therapy Evaluation Patient Details Name: [name] MRN: [id number] DOB: 11/29/1940 Today's Date: 04/08/2019   History of Present Illness  79 yo admitted after fall in kitchen hitting a chair with left 7-10 rib fx. PMhx: HTN, HLD, gout, CHF, COPD, CKD, depression, macular degeneration  Clinical Impression  Pt pleasant dependent on 2L O2 at home who has daughter niece and nephew available to assist per pt. Pt with decreased activity tolerance limited by pain with medication given during session. Pt able to complete pericare in standing with assist for stability and balance. Pt with decreased strength, transfers, function and activity tolerance who will benefit from acute therapy to maximize mobility and safety. Pt educated for IS use and performed able to achieve 750 cc.    Follow Up Recommendations Home health PT;Supervision/Assistance - 24 hour(if able to improve mobility)    Equipment Recommendations  3in1 (PT)    Recommendations for Other Services OT consult     Precautions / Restrictions Precautions Precautions: Fall Restrictions Weight Bearing Restrictions: No      Mobility  Bed Mobility Overal bed mobility: Needs Assistance Bed Mobility: Rolling;Sidelying to Sit Rolling: Min assist Sidelying to sit: Mod assist       General bed mobility comments: cues for sequence with assist to roll and elevate trunk, pt with adjustable bed at home and HOB 25 degrees for transfer  Transfers Overall transfer level: Needs assistance   Transfers: Sit to/from Stand;Stand Pivot Transfers Sit to Stand: Min assist Stand pivot transfers: Min assist       General transfer comment: min assist to stand from bed and BSc with RUE hooking for mobility with assist to pivot to chair  Ambulation/Gait             General Gait Details: pt deferred due to pain  Stairs            Wheelchair Mobility    Modified Rankin (Stroke Patients Only)       Balance Overall  balance assessment: Needs assistance   Sitting balance-Leahy Scale: Good     Standing balance support: Single extremity supported Standing balance-Leahy Scale: Poor                               Pertinent Vitals/Pain Pain Assessment: 0-10 Pain Score: 8  Pain Location: left chest Pain Descriptors / Indicators: Aching;Guarding;Tender Pain Intervention(s): Limited activity within patient's tolerance;Repositioned    Home Living Family/patient expects to be discharged to:: Private residence Living Arrangements: Alone Available Help at Discharge: Family;Available 24 hours/day Type of Home: House Home Access: Ramped entrance     Home Layout: One level Home Equipment: Walker - 4 wheels;Cane - single point;Shower seat - built in      Prior Function Level of Independence: Independent               Journalist, newspaper        Extremity/Trunk Assessment   Upper Extremity Assessment Upper Extremity Assessment: Generalized weakness;LUE deficits/detail LUE Deficits / Details: decreased LUE movement due to pain    Lower Extremity Assessment Lower Extremity Assessment: Generalized weakness    Cervical / Trunk Assessment Cervical / Trunk Assessment: Kyphotic  Communication   Communication: No difficulties  Cognition Arousal/Alertness: Awake/alert Behavior During Therapy: WFL for tasks assessed/performed Overall Cognitive Status: Within Functional Limits for tasks assessed  General Comments General comments (skin integrity, edema, etc.): pt reprots 2 recent falls    Exercises     Assessment/Plan    PT Assessment Patient needs continued PT services  PT Problem List Decreased strength;Decreased mobility;Decreased activity tolerance;Pain;Decreased knowledge of use of DME       PT Treatment Interventions DME instruction;Gait training;Therapeutic exercise;Balance training;Functional mobility  training;Therapeutic activities;Patient/family education    PT Goals (Current goals can be found in the Care Plan section)  Acute Rehab PT Goals Patient Stated Goal: be able to return home PT Goal Formulation: With patient Time For Goal Achievement: 04/22/19 Potential to Achieve Goals: Fair    Frequency Min 4X/week   Barriers to discharge Decreased caregiver support      Co-evaluation               AM-PAC PT "6 Clicks" Mobility  Outcome Measure Help needed turning from your back to your side while in a flat bed without using bedrails?: A Little Help needed moving from lying on your back to sitting on the side of a flat bed without using bedrails?: A Lot Help needed moving to and from a bed to a chair (including a wheelchair)?: A Little Help needed standing up from a chair using your arms (e.g., wheelchair or bedside chair)?: A Little Help needed to walk in hospital room?: A Lot Help needed climbing 3-5 steps with a railing? : A Lot 6 Click Score: 15    End of Session Equipment Utilized During Treatment: Gait belt;Oxygen Activity Tolerance: Patient tolerated treatment well Patient left: in chair;with call bell/phone within reach;with chair alarm set Nurse Communication: Mobility status PT Visit Diagnosis: Other abnormalities of gait and mobility (R26.89);Difficulty in walking, not elsewhere classified (R26.2)    Time: 8527-7824 PT Time Calculation (min) (ACUTE ONLY): 31 min   Charges:   PT Evaluation $PT Eval Moderate Complexity: 1 Mod PT Treatments $Therapeutic Activity: 8-22 mins        Jarelis Ehlert P, PT Acute Rehabilitation Services Pager: [phone] Office: [phone]   Arminta Gamm B Telesa Jeancharles 04/08/2019, 10:37 AM

## 2019-04-09 LAB — CBC
HCT: 32.9 % — ABNORMAL LOW (ref 36.0–46.0)
Hemoglobin: 10.2 g/dL — ABNORMAL LOW (ref 12.0–15.0)
MCH: 32.2 pg (ref 26.0–34.0)
MCHC: 31 g/dL (ref 30.0–36.0)
MCV: 103.8 fL — ABNORMAL HIGH (ref 80.0–100.0)
Platelets: 80 10*3/uL — ABNORMAL LOW (ref 150–400)
RBC: 3.17 MIL/uL — ABNORMAL LOW (ref 3.87–5.11)
RDW: 13.1 % (ref 11.5–15.5)
WBC: 6.7 10*3/uL (ref 4.0–10.5)
nRBC: 0 % (ref 0.0–0.2)

## 2019-04-09 LAB — BASIC METABOLIC PANEL
Anion gap: 8 (ref 5–15)
BUN: 33 mg/dL — ABNORMAL HIGH (ref 8–23)
CO2: 29 mmol/L (ref 22–32)
Calcium: 9.3 mg/dL (ref 8.9–10.3)
Chloride: 105 mmol/L (ref 98–111)
Creatinine, Ser: 1.51 mg/dL — ABNORMAL HIGH (ref 0.44–1.00)
GFR calc Af Amer: 38 mL/min — ABNORMAL LOW (ref >60)
GFR calc non Af Amer: 33 mL/min — ABNORMAL LOW (ref >60)
Glucose, Bld: 135 mg/dL — ABNORMAL HIGH (ref 70–99)
Potassium: 4.5 mmol/L (ref 3.5–5.1)
Sodium: 142 mmol/L (ref 135–145)

## 2019-04-09 NOTE — Progress Notes (Signed)
Physical Therapy Treatment Patient Details Name: [name] MRN: [id number] DOB: 1940/05/03 Today's Date: 04/09/2019    History of Present Illness 79 yo admitted after fall in kitchen hitting a chair with left 7-10 rib fx. PMhx: HTN, HLD, gout, CHF, COPD, CKD, depression, macular degeneration    PT Comments    Patient reported today that she now realizes she will likely need rehab before returning home. She reported a sligth improvement in pain with bed mobilize and transfers. She was able to ambulate today but limited distances. She is still limited by pain. Acute therapy will continue to progress patient towards goals.    Follow Up Recommendations  SNF     Equipment Recommendations  3in1 (PT)    Recommendations for Other Services       Precautions / Restrictions Precautions Precautions: Fall    Mobility  Bed Mobility Overal bed mobility: Needs Assistance Bed Mobility: Sit to Supine       Sit to supine: Mod assist   General bed mobility comments: continues to require mod a to stand and take steps   Transfers Overall transfer level: Needs assistance   Transfers: Sit to/from Stand;Stand Pivot Transfers Sit to Stand: Min assist Stand pivot transfers: Min assist       General transfer comment: still anxious to stand. When she was able to she had pain but required only min a.   Ambulation/Gait Ambulation/Gait assistance: Min guard Gait Distance (Feet): 3 Feet Assistive device: Standard walker Gait Pattern/deviations: Step-to pattern Gait velocity: decreased Gait velocity interpretation: <1.31 ft/sec, indicative of household ambulator General Gait Details: slow deliberate steps. Only able to walk to the chair.   Stairs             Wheelchair Mobility    Modified Rankin (Stroke Patients Only)       Balance Overall balance assessment: Needs assistance   Sitting balance-Leahy Scale: Good Sitting balance - Comments: did not require UE upport  today    Standing balance support: Bilateral upper extremity supported Standing balance-Leahy Scale: Poor Standing balance comment: put her left hand on the walker today                             Cognition Arousal/Alertness: Awake/alert                                     General Comments: less anxious today but still anxious about pain and movement       Exercises      General Comments        Pertinent Vitals/Pain Pain Assessment: 0-10 Pain Score: 6  Pain Location: left chest/ribs  Pain Descriptors / Indicators: Aching;Guarding;Tender    Home Living                      Prior Function            PT Goals (current goals can now be found in the care plan section) Acute Rehab PT Goals Patient Stated Goal: to have less pain  PT Goal Formulation: With patient Time For Goal Achievement: 04/22/19 Potential to Achieve Goals: Fair Progress towards PT goals: Progressing toward goals    Frequency    Min 3X/week      PT Plan Discharge plan needs to be updated    Co-evaluation  AM-PAC PT "6 Clicks" Mobility   Outcome Measure  Help needed turning from your back to your side while in a flat bed without using bedrails?: A Little Help needed moving from lying on your back to sitting on the side of a flat bed without using bedrails?: A Lot Help needed moving to and from a bed to a chair (including a wheelchair)?: A Little Help needed standing up from a chair using your arms (e.g., wheelchair or bedside chair)?: A Little Help needed to walk in hospital room?: A Lot Help needed climbing 3-5 steps with a railing? : A Lot 6 Click Score: 15    End of Session Equipment Utilized During Treatment: Gait belt;Oxygen Activity Tolerance: Patient tolerated treatment well Patient left: in chair;with call bell/phone within reach;with chair alarm set Nurse Communication: Mobility status PT Visit Diagnosis: Other abnormalities  of gait and mobility (R26.89);Difficulty in walking, not elsewhere classified (R26.2)     Time: 9371-6967 PT Time Calculation (min) (ACUTE ONLY): 19 min  Charges:  $Therapeutic Activity: 8-22 mins                        Carney Living PT DPT  04/09/2019, 12:45 PM

## 2019-04-09 NOTE — Plan of Care (Signed)

## 2019-04-09 NOTE — Progress Notes (Signed)
PROGRESS NOTE    [name]  QPY:[id number] DOB: Apr 21, 1940 DOA: 04/07/2019 PCP: Mosie Lukes, MD    Brief Narrative:  [name] is a 79 year old Caucasian female with past medical history remarkable for PMR, HTN, HLD, gout, hypercalcemia, history of DVT, chronic diastolic congestive heart failure, COPD on 2 L [place] baseline, CKD stage III who presented with mechanical fall and rib pain.  States was getting up to go to the restroom and went to get water in the kitchen and slipped and fell on the kitchen floor hitting a chair on the way down.  Patient sustained pain to her left ribs and back.  Presenting with severe pain with movement.  Denies syncopal episode.  In ED was noted to have significant rib fractures on the left, trauma surgery consulted.  Medicine consulted for admission for pain control and mobility assessment.   Assessment & Plan:   Principal Problem:   Rib fracture Active Problems:   Hyperlipidemia, mixed   Gout   Essential hypertension   Diabetes type 2, controlled ([place])   Fall at home, initial encounter   Chronic respiratory failure with hypoxia and hypercapnia (HCC)   Polymyalgia rheumatica (HCC)   Chronic combined systolic and diastolic CHF (congestive heart failure) (HCC)   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HCC)   Acute Left 7-10 contiguous rib fractures Evidence of chronic rib fractures Patient presenting to the ED with chest wall pain following mechanical fall resulting in left-sided rib fractures.  Rib fractures contiguous with concern of flail chest; which is not apparent at this time. --Trauma surgery following, appreciate assistance --Tylenol 1 g every 6 hours, lidocaine patch, Robaxin 750 mg PO QID --oxycodone 5-'10mg'$  q6h prn --Dilaudid 0.'5mg'$  IV q2h prn for severe breakthrough pain --Pulmonary toilet with incentive spirometry, flutter valve --PT/OT noted max assist yesterday, recommends home health if 24-hour care, although patient lives alone, and  patient interested in SNF placement to achieve her previous baseline mobility/activity. --TOC consult for SNF placement  Chronic diastolic congestive heart failure; currently compensated TTE 10/2017 with preserved EF and grade 1 diastolic dysfunction.  On furosemide '20mg'$  PO daily at home.  --Hold home furosemide for now  Chronic hypoxic respiratory failure COPD on 2L [place] baseline --Continue Symbicort, Claritin, Singulair --albuterol nebs prn --Currently oxygenating well on 2 L nasal cannula  CKD stage IV Baseline GFR last year 23-29 with creatinine 1.7 - 2.0 over the past year. --Creatinine 1.51 today --Avoid nephrotoxins, renally dose all medications  HLD: Continue statin  Essential hypertension: Continue metoprolol succinate 75 mg p.o. daily  Depression: Continue sertraline 100 mg p.o. daily  GERD: Continue Protonix 40 mg p.o. twice daily  Gout: Continue allopurinol 20 mg p.o. daily   DVT prophylaxis: Lovenox Code Status: Full code Family Communication: Discussed with patient extensively at bedside Disposition Plan:      Patient is from: Home alone     Anticipated Disposition:  SNF placement     Barriers to discharge or conditions that needs to be met prior to discharge: Unsafe discharge home as patient requires max assistance for ADLs Per PT/OT, patient lives alone, pending SNF placement, transition of care consulted.  Consultants:   Trauma surgery  Procedures:   None  Antimicrobials:   None   Subjective: Patient seen and examined bedside, resting comfortably.  Continues with rib pain.  Work with physical therapy and Occupational Therapy yesterday with noted max assist required.  Patient concerned that she lives alone, does have family close by but  they are unlikely to be able to be with her for an extended period of time and she wishes to proceed with SNF placement to achieve back her previous mobility/independence.  No other complaints or concerns at this time.  Denies headache, no fever/chills/night sweats, no nausea/vomiting/diarrhea, no chest pain, palpitations, no shortness of breath, no abdominal pain, no weakness, no fatigue, no paresthesias.  No acute events overnight per nursing staff.  Objective: Vitals:   04/08/19 1916 04/08/19 2025 04/09/19 0411 04/09/19 0809  BP: 125/61  (!) 124/58   Pulse: 78  68   Resp: 18  18   Temp: 98.7 F (37.1 C)  98.8 F (37.1 C)   TempSrc: Oral     SpO2: 99% 98% 100% 98%  Weight:      Height:        Intake/Output Summary (Last 24 hours) at 04/09/2019 1052 Last data filed at 04/09/2019 0900 Gross per 24 hour  Intake 123 ml  Output --  Net 123 ml   Filed Weights   04/07/19 0422  Weight: 84.4 kg    Examination:  General exam: Appears calm and comfortable  Respiratory system: Clear to auscultation. Respiratory effort normal.  Oxygenating 97% on 2L [place] which is her baseline Cardiovascular system: S1 & S2 heard, RRR. No JVD, murmurs, rubs, gallops or clicks. No pedal edema. Gastrointestinal system: Abdomen is nondistended, soft and nontender. No organomegaly or masses felt. Normal bowel sounds heard. Central nervous system: Alert and oriented. No focal neurological deficits. Extremities: Symmetric 5 x 5 power. Skin: No rashes, lesions or ulcers Psychiatry: Judgement and insight appear normal. Mood & affect appropriate.     Data Reviewed: I have personally reviewed following labs and imaging studies  CBC: Recent Labs  Lab 04/06/19 1105 04/07/19 0511 04/08/19 0340 04/09/19 0334  WBC 5.9 6.9 5.4 6.7  NEUTROABS 4.0 5.0  --   --   HGB 11.2* 11.4* 9.4* 10.2*  HCT 35.5* 36.6 30.7* 32.9*  MCV 102.0* 104.3* 103.0* 103.8*  PLT 108* 95* 78* 80*   Basic Metabolic Panel: Recent Labs  Lab 04/06/19 1105 04/07/19 0511 04/08/19 0340 04/09/19 0334  NA 141 143 144 142  K 4.7 4.4 4.8 4.5  CL 102 104 108 105  CO2 32 '28 28 29  '$ GLUCOSE 139* 135* 127* 135*  BUN 55* 57* 49* 33*  CREATININE 2.01*  1.81* 1.66* 1.51*  CALCIUM 9.7 9.4 9.2 9.3   GFR: Estimated Creatinine Clearance: 32.3 mL/min (A) (by C-G formula based on SCr of 1.51 mg/dL (H)). Liver Function Tests: Recent Labs  Lab 04/06/19 1105  AST 12*  ALT 8  ALKPHOS 44  BILITOT 0.4  PROT 6.7  ALBUMIN 4.5   No results for input(s): LIPASE, AMYLASE in the last 168 hours. No results for input(s): AMMONIA in the last 168 hours. Coagulation Profile: No results for input(s): INR, PROTIME in the last 168 hours. Cardiac Enzymes: No results for input(s): CKTOTAL, CKMB, CKMBINDEX, TROPONINI in the last 168 hours. BNP (last 3 results) No results for input(s): PROBNP in the last 8760 hours. HbA1C: No results for input(s): HGBA1C in the last 72 hours. CBG: No results for input(s): GLUCAP in the last 168 hours. Lipid Profile: No results for input(s): CHOL, HDL, LDLCALC, TRIG, CHOLHDL, LDLDIRECT in the last 72 hours. Thyroid Function Tests: No results for input(s): TSH, T4TOTAL, FREET4, T3FREE, THYROIDAB in the last 72 hours. Anemia Panel: Recent Labs    04/06/19 1105  FERRITIN 475*  TIBC 248  IRON 113  RETICCTPCT 0.9   Sepsis Labs: No results for input(s): PROCALCITON, LATICACIDVEN in the last 168 hours.  Recent Results (from the past 240 hour(s))  SARS CORONAVIRUS 2 (TAT 6-24 HRS) Nasopharyngeal Nasopharyngeal Swab     Status: None   Collection Time: 04/07/19  6:46 AM   Specimen: Nasopharyngeal Swab  Result Value Ref Range Status   SARS Coronavirus 2 NEGATIVE NEGATIVE Final    Comment: (NOTE) SARS-CoV-2 target nucleic acids are NOT DETECTED. The SARS-CoV-2 RNA is generally detectable in upper and lower respiratory specimens during the acute phase of infection. Negative results do not preclude SARS-CoV-2 infection, do not rule out co-infections with other pathogens, and should not be used as the sole basis for treatment or other patient management decisions. Negative results must be combined with clinical  observations, patient history, and epidemiological information. The expected result is Negative. Fact Sheet for Patients: SugarRoll.be Fact Sheet for Healthcare Providers: https://www.woods-mathews.com/ This test is not yet approved or cleared by the [place] FDA and  has been authorized for detection and/or diagnosis of SARS-CoV-2 by FDA under an Emergency Use Authorization (EUA). This EUA will remain  in effect (meaning this test can be used) for the duration of the COVID-19 declaration under Section 56 4(b)(1) of the Act, 21 U.S.C. section 360bbb-3(b)(1), unless the authorization is terminated or revoked sooner. Performed at [facility], [place] [address] [place], [place] [address]          Radiology Studies: No results found.      Scheduled Meds: . acetaminophen  1,000 mg Oral Q6H  . allopurinol  200 mg Oral Daily  . docusate sodium  100 mg Oral BID  . enoxaparin (LOVENOX) injection  30 mg Subcutaneous Q24H  . lidocaine  2 patch Transdermal Q24H  . methocarbamol  750 mg Oral QID  . metoprolol succinate  75 mg Oral Daily  . mometasone-formoterol  2 puff Inhalation BID  . montelukast  10 mg Oral QHS  . pantoprazole  40 mg Oral BID  . polyethylene glycol  17 g Oral Daily  . pravastatin  40 mg Oral QPM  . sertraline  100 mg Oral Daily  . sodium chloride flush  3 mL Intravenous Q12H   Continuous Infusions:   LOS: 2 days    Time spent: 34 minutes spent on chart review, discussion with nursing staff, consultants, updating family and interview/physical exam; more than 50% of that time was spent in counseling and/or coordination of care.    Livana Yerian J [place] DO [facility] Available via Epic secure chat 7am-7pm After these hours, please refer to coverage provider listed on [email or web address] 04/09/2019, 10:52 AM

## 2019-04-09 NOTE — TOC Initial Note (Signed)
Transition of Care [facility]) - Initial/Assessment Note    Patient Details  Name: [name] MRN: [id number] Date of Birth: January 14, 1941  Transition of Care [facility] CM/SW Contact:    Alexander Mt, LCSW Phone Number: 04/09/2019, 12:45 PM  Clinical Narrative:                 Per handoff and pending TOC note from Levada Dy, [facility] pt had been set up with [facility] services. CSW contacted that pt now interested in SNF placement.   CSW called pt via room phone, introduced self, role, reason for call. Pt from home with family support. Confirmed home address and PCP. Pt interested in rehab, has never been before per her report. CSW explained how referral works and that [facility] team member would meet with pt again with offers once received for her to look over.   Pt states understanding, no further questions voiced when asked. Pt PASRR is pending due to dx of depression/zoloft on MAR. Pt will also require [facility] authorization submitted by SNF of choice.   Expected Discharge Plan: Skilled Nursing Facility Barriers to Discharge: Continued Medical Work up, Ship broker, Environmental education officer)   Patient Goals and CMS Choice Patient states their goals for this hospitalization and ongoing recovery are:: get stronger before going home CMS Medicare.gov Compare Post Acute Care list provided to:: Patient Choice offered to / list presented to : Patient  Expected Discharge Plan and Services Expected Discharge Plan: [place]   Discharge Planning Services: CM Consult Post Acute Care Choice: [place] Living arrangements for the past 2 months: Single Family Home    Prior Living Arrangements/Services Living arrangements for the past 2 months: Single Family Home Lives with:: Self Patient language and need for interpreter reviewed:: Yes(no needs) Do you feel safe going back to the place where you live?: Yes      Need for Family Participation in Patient Care: Yes  (Comment)(assistance with daily cares) Care giver support system in place?: Yes (comment)(adult daughter/grandsons)   Criminal Activity/Legal Involvement Pertinent to Current Situation/Hospitalization: No - Comment as needed  Activities of Daily Living Home Assistive Devices/Equipment: Eyeglasses, Dentures (specify type), Oxygen, Walker (specify type) ADL Screening (condition at time of admission) Patient's cognitive ability adequate to safely complete daily activities?: No Is the patient deaf or have difficulty hearing?: No Does the patient have difficulty seeing, even when wearing glasses/contacts?: No Does the patient have difficulty concentrating, remembering, or making decisions?: No Patient able to express need for assistance with ADLs?: Yes Does the patient have difficulty dressing or bathing?: No Independently performs ADLs?: Yes (appropriate for developmental age) Does the patient have difficulty walking or climbing stairs?: Yes Weakness of Legs: Left Weakness of Arms/Hands: Left  Permission Sought/Granted   Permission granted to share information with : Yes, Verbal Permission Granted  Share Information with NAME: [name] ([phone]           Emotional Assessment Appearance:: Other (Comment Required(telephonic assessment) Attitude/Demeanor/Rapport: Engaged(telephonic assessment) Affect (typically observed): Accepting, Adaptable, Appropriate(telephonic assessment) Orientation: : Oriented to  Time, Oriented to Situation, Oriented to Place, Oriented to Self Alcohol / Substance Use: Not Applicable Psych Involvement: (n/a, pt prescribed Zoloft)  Admission diagnosis:  Rib fracture [S22.39XA] Fall, initial encounter [W19.XXXA] Multiple fractures of ribs, left side, initial encounter for closed fracture [S22.42XA] Patient Active Problem List   Diagnosis Date Noted  . Rib fracture 04/07/2019  . Chronic asthma, mild persistent, uncomplicated 93/81/8299  .  High serum vitamin  B12 11/20/2017  . Subarachnoid hemorrhage ([place]) 11/05/2017  . Intracranial hemorrhage ([place]) 11/04/2017  . Syncope and collapse 11/04/2017  . Thrombocytopenia ([place]) 10/16/2017  . 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 Vitamin D deficiency 06/16/2017  . Hypernatremia 12/25/2016  . Nonintractable headache 10/26/2016  . Leukocytes in urine 10/26/2016  . Peripheral arterial disease ([place]) 10/01/2016  . Renal insufficiency 09/22/2016  . Abnormal TSH 09/22/2016  . 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[place]) 08/04/2016  . Heart murmur 04/25/2016  . Chronic combined systolic and diastolic CHF (congestive heart failure) ([place]) 04/07/2016  . Bronchitis 03/06/2016  . Polymyalgia rheumatica ([place]) 01/07/2016  . Pedal edema 08/28/2015  . Chronic respiratory failure with hypoxia and hypercapnia ([place]) 06/28/2015  . Macular degeneration 04/10/2015  . Fall at home, initial encounter 12/04/2014  . Hypercalcemia 10/15/2014  . Hypoxia 10/15/2014  . 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 Cervical cancer screening 02/28/2014  . Eustachian tube dysfunction 01/29/2014  . Medicare annual wellness visit, subsequent 12/04/2013  . Constipation 06/05/2013  . Shortness of breath 03/07/2013  . Tachycardia 02/23/2013  . Sun-damaged skin 10/24/2012  . Lower urinary tract infectious disease 10/24/2012  . Back pain 04/07/2011  . Allergic rhinitis 05/23/2009  . Gout 01/11/2008  . VARICOSE VEINS, LOWER EXTREMITIES 06/01/2007  . ADJ DISORDER WITH MIXED ANXIETY & DEPRESSED MOOD 03/25/2007  . Hyperlipidemia, mixed 12/29/2006  . Essential hypertension 12/29/2006  . Osteopenia 12/29/2006  . Abdominal pain 12/29/2006  . Diabetes type 2, controlled ([place]) 12/29/2006  . Asthma, moderate persistent 08/21/2006  . GERD 08/21/2006  . Osteoarthritis 08/21/2006  . Phlebitis and thrombophlebitis 08/21/2006   PCP:  Mosie Lukes, MD Pharmacy:   [facility] [address] ([address]), [place] [address] [place] [address] Phone: [phone] Fax: [phone]  [facility] [place], [place] [address] Phone: [phone] Fax: [phone]  [place], [place] [address] [place] [address] Phone: [phone] Fax: [phone]   Readmission Risk Interventions Readmission Risk Prevention Plan 04/09/2019  Transportation Screening Complete  PCP or Specialist Appt within 3-5 Days Not Complete  Not Complete comments plan for SNF  HRI or [place] Not Complete  HRI or Home Care Consult comments plan for SNF  Social Work Consult for [place] Planning/Counseling Complete  Palliative Care Screening Not Applicable  Medication Review (RN Care Manager) Referral to Pharmacy  Some recent data might be hidden

## 2019-04-09 NOTE — Progress Notes (Signed)
Patient ID: [name], female   DOB: [birth year], 79 y.o.   MRN: [id number] [facility] Progress Note:   * No surgery found *  Subjective: Mental status is alert and appropriate Objective: Vital signs in last 24 hours: Temp:  [98.7 F (37.1 C)-98.9 F (37.2 C)] 98.8 F (37.1 C) (03/20 0411) Pulse Rate:  [68-79] 68 (03/20 0411) Resp:  [18] 18 (03/20 0411) BP: (124-127)/(58-62) 124/58 (03/20 0411) SpO2:  [97 %-100 %] 98 % (03/20 0809)  Intake/Output from previous day: 03/19 0701 - 03/20 0700 In: 3 [I.V.:3] Out: -  Intake/Output this shift: Total I/O In: 120 [P.O.:120] Out: -   Physical Exam: Work of breathing is not labored at rest  Lab Results:  Results for orders placed or performed during the hospital encounter of 04/07/19 (from the past 48 hour(s))  Basic metabolic panel     Status: Abnormal   Collection Time: 04/08/19  3:40 AM  Result Value Ref Range   Sodium 144 135 - 145 mmol/L   Potassium 4.8 3.5 - 5.1 mmol/L   Chloride 108 98 - 111 mmol/L   CO2 28 22 - 32 mmol/L   Glucose, Bld 127 (H) 70 - 99 mg/dL    Comment: Glucose reference range applies only to samples taken after fasting for at least 8 hours.   BUN 49 (H) 8 - 23 mg/dL   Creatinine, Ser 1.66 (H) 0.44 - 1.00 mg/dL   Calcium 9.2 8.9 - 10.3 mg/dL   GFR calc non Af Amer 29 (L) >60 mL/min   GFR calc Af Amer 34 (L) >60 mL/min   Anion gap 8 5 - 15    Comment: Performed at [facility] [address], [place], [place] [address]  CBC     Status: Abnormal   Collection Time: 04/08/19  3:40 AM  Result Value Ref Range   WBC 5.4 4.0 - 10.5 K/uL   RBC 2.98 (L) 3.87 - 5.11 MIL/uL   Hemoglobin 9.4 (L) 12.0 - 15.0 g/dL   HCT 30.7 (L) 36.0 - 46.0 %   MCV 103.0 (H) 80.0 - 100.0 fL   MCH 31.5 26.0 - 34.0 pg   MCHC 30.6 30.0 - 36.0 g/dL   RDW 13.1 11.5 - 15.5 %   Platelets 78 (L) 150 - 400 K/uL    Comment: REPEATED TO VERIFY Immature Platelet Fraction may be clinically indicated,  consider ordering this additional test [id number]    nRBC 0.0 0.0 - 0.2 %    Comment: Performed at [facility], [place] [address], [place], [place] [address]  CBC     Status: Abnormal   Collection Time: 04/09/19  3:34 AM  Result Value Ref Range   WBC 6.7 4.0 - 10.5 K/uL   RBC 3.17 (L) 3.87 - 5.11 MIL/uL   Hemoglobin 10.2 (L) 12.0 - 15.0 g/dL   HCT 32.9 (L) 36.0 - 46.0 %   MCV 103.8 (H) 80.0 - 100.0 fL   MCH 32.2 26.0 - 34.0 pg   MCHC 31.0 30.0 - 36.0 g/dL   RDW 13.1 11.5 - 15.5 %   Platelets 80 (L) 150 - 400 K/uL    Comment: REPEATED TO VERIFY SPECIMEN CHECKED FOR CLOTS Immature Platelet Fraction may be clinically indicated, consider ordering this additional test [id number] CONSISTENT WITH PREVIOUS RESULT    nRBC 0.0 0.0 - 0.2 %    Comment: Performed at [facility], [place] [address], [place], [place] [address]  Basic  metabolic panel     Status: Abnormal   Collection Time: 04/09/19  3:34 AM  Result Value Ref Range   Sodium 142 135 - 145 mmol/L   Potassium 4.5 3.5 - 5.1 mmol/L   Chloride 105 98 - 111 mmol/L   CO2 29 22 - 32 mmol/L   Glucose, Bld 135 (H) 70 - 99 mg/dL    Comment: Glucose reference range applies only to samples taken after fasting for at least 8 hours.   BUN 33 (H) 8 - 23 mg/dL   Creatinine, Ser 1.51 (H) 0.44 - 1.00 mg/dL   Calcium 9.3 8.9 - 10.3 mg/dL   GFR calc non Af Amer 33 (L) >60 mL/min   GFR calc Af Amer 38 (L) >60 mL/min   Anion gap 8 5 - 15    Comment: Performed at [facility] [address] [place], [place] [address]    Radiology/Results: No results found.  Anti-infectives: Anti-infectives (From admission, onward)   None      Assessment/Plan: Problem List: Patient Active Problem List   Diagnosis Date Noted  . Rib fracture 04/07/2019  . Chronic asthma, mild persistent, uncomplicated 14/97/0263  . High serum vitamin B12 11/20/2017  . Subarachnoid hemorrhage ([place]) 11/05/2017  . Intracranial hemorrhage ([place]) 11/04/2017   . Syncope and collapse 11/04/2017  . Thrombocytopenia ([place]) 10/16/2017  . 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 Vitamin D deficiency 06/16/2017  . Hypernatremia 12/25/2016  . Nonintractable headache 10/26/2016  . Leukocytes in urine 10/26/2016  . Peripheral arterial disease ([place]) 10/01/2016  . Renal insufficiency 09/22/2016  . Abnormal TSH 09/22/2016  . 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[place]) 08/04/2016  . Heart murmur 04/25/2016  . Chronic combined systolic and diastolic CHF (congestive heart failure) ([place]) 04/07/2016  . Bronchitis 03/06/2016  . Polymyalgia rheumatica ([place]) 01/07/2016  . Pedal edema 08/28/2015  . Chronic respiratory failure with hypoxia and hypercapnia ([place]) 06/28/2015  . Macular degeneration 04/10/2015  . Fall at home, initial encounter 12/04/2014  . Hypercalcemia 10/15/2014  . Hypoxia 10/15/2014  . 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 Cervical cancer screening 02/28/2014  . Eustachian tube dysfunction 01/29/2014  . Medicare annual wellness visit, subsequent 12/04/2013  . Constipation 06/05/2013  . Shortness of breath 03/07/2013  . Tachycardia 02/23/2013  . Sun-damaged skin 10/24/2012  . Lower urinary tract infectious disease 10/24/2012  . Back pain 04/07/2011  . Allergic rhinitis 05/23/2009  . Gout 01/11/2008  . VARICOSE VEINS, LOWER EXTREMITIES 06/01/2007  . ADJ DISORDER WITH MIXED ANXIETY & DEPRESSED MOOD 03/25/2007  . Hyperlipidemia, mixed 12/29/2006  . Essential hypertension 12/29/2006  . Osteopenia 12/29/2006  . Abdominal pain 12/29/2006  . Diabetes type 2, controlled ([place]) 12/29/2006  . Asthma, moderate persistent 08/21/2006  . GERD 08/21/2006  . Osteoarthritis 08/21/2006  . Phlebitis and thrombophlebitis 08/21/2006    Agree with Dr. Aneta Mins plan for rehab or SNF at discharge.   * No surgery found *    LOS: 2 days   Matt B. Hassell Done, MD, [facility] [phone]  beeper [phone]  04/09/2019 11:11 AM

## 2019-04-09 NOTE — NC FL2 (Signed)
[place] LEVEL OF CARE SCREENING TOOL     IDENTIFICATION  Patient Name: [name] Birthdate: 1940/10/20 Sex: female Admission Date (Current Location): 04/07/2019  [facility] and [place] Number:  Herbalist and Address:  [facility], [place] [address] [place], [place] [address]      Provider Number: [id number]  Attending Physician Name and Address:  [place] Donnamarie Poag, DO  Relative Name and Phone Number:       Current Level of Care: Hospital Recommended Level of Care: [place] Prior Approval Number:    Date Approved/Denied:   PASRR Number: pending  Discharge Plan: SNF    Current Diagnoses: Patient Active Problem List   Diagnosis Date Noted  . Rib fracture 04/07/2019  . Chronic asthma, mild persistent, uncomplicated 14/48/1856  . High serum vitamin B12 11/20/2017  . Subarachnoid hemorrhage ([place]) 11/05/2017  . Intracranial hemorrhage ([place]) 11/04/2017  . Syncope and collapse 11/04/2017  . Thrombocytopenia ([place]) 10/16/2017  . 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 Vitamin D deficiency 06/16/2017  . Hypernatremia 12/25/2016  . Nonintractable headache 10/26/2016  . Leukocytes in urine 10/26/2016  . Peripheral arterial disease ([place]) 10/01/2016  . Renal insufficiency 09/22/2016  . Abnormal TSH 09/22/2016  . 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[place]) 08/04/2016  . Heart murmur 04/25/2016  . Chronic combined systolic and diastolic CHF (congestive heart failure) ([place]) 04/07/2016  . Bronchitis 03/06/2016  . Polymyalgia rheumatica ([place]) 01/07/2016  . Pedal edema 08/28/2015  . Chronic respiratory failure with hypoxia and hypercapnia ([place]) 06/28/2015  . Macular degeneration 04/10/2015  . Fall at home, initial encounter 12/04/2014  . Hypercalcemia 10/15/2014  . Hypoxia 10/15/2014  . 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 Cervical cancer screening 02/28/2014  . Eustachian tube  dysfunction 01/29/2014  . Medicare annual wellness visit, subsequent 12/04/2013  . Constipation 06/05/2013  . Shortness of breath 03/07/2013  . Tachycardia 02/23/2013  . Sun-damaged skin 10/24/2012  . Lower urinary tract infectious disease 10/24/2012  . Back pain 04/07/2011  . Allergic rhinitis 05/23/2009  . Gout 01/11/2008  . VARICOSE VEINS, LOWER EXTREMITIES 06/01/2007  . ADJ DISORDER WITH MIXED ANXIETY & DEPRESSED MOOD 03/25/2007  . Hyperlipidemia, mixed 12/29/2006  . Essential hypertension 12/29/2006  . Osteopenia 12/29/2006  . Abdominal pain 12/29/2006  . Diabetes type 2, controlled ([place]) 12/29/2006  . Asthma, moderate persistent 08/21/2006  . GERD 08/21/2006  . Osteoarthritis 08/21/2006  . Phlebitis and thrombophlebitis 08/21/2006    Orientation RESPIRATION BLADDER Height & Weight     Self, Time, Place, Situation  O2(2L nasal canula) Incontinent, External catheter Weight: 186 lb (84.4 kg) Height:  5\' 4"  (162.6 cm)  BEHAVIORAL SYMPTOMS/MOOD NEUROLOGICAL BOWEL NUTRITION STATUS      Continent Diet(see discharge summary)  AMBULATORY STATUS COMMUNICATION OF NEEDS Skin   Extensive Assist Verbally Bruising(generalized ecchymosis)                       Personal Care Assistance Level of Assistance  Bathing, Feeding, Dressing Bathing Assistance: Maximum assistance Feeding assistance: Independent Dressing Assistance: Maximum assistance     Functional Limitations Info  Sight, Hearing, Speech Sight Info: Adequate Hearing Info: Adequate Speech Info: Adequate    SPECIAL CARE FACTORS FREQUENCY  OT (By licensed OT), PT (By licensed PT)     PT Frequency: 5x week OT Frequency: 5x week            Contractures Contractures Info: Not  present    Additional Factors Info  Code Status, Allergies, Psychotropic Code Status Info: Full Code Allergies Info: Montelukast Sodium, Sulfa Antibiotics, Sulfonamide Derivatives, Oseltamivir Phosphate, Ace Inhibitors,  Indomethacin Psychotropic Info: sertraline (ZOLOFT) tablet 100 mg PO         Current Medications (04/09/2019):  This is the current hospital active medication list Current Facility-Administered Medications  Medication Dose Route Frequency Provider Last Rate Last Admin  . acetaminophen (TYLENOL) tablet 650 mg  650 mg Oral Q6H PRN Karmen Bongo, MD       Or  . acetaminophen (TYLENOL) suppository 650 mg  650 mg Rectal Q6H PRN Karmen Bongo, MD      . acetaminophen (TYLENOL) tablet 1,000 mg  1,000 mg Oral Q6H Karmen Bongo, MD   1,000 mg at 04/09/19 0735  . albuterol (PROVENTIL) (2.5 MG/3ML) 0.083% nebulizer solution 2.5 mg  2.5 mg Nebulization Q6H PRN Karmen Bongo, MD      . allopurinol (ZYLOPRIM) tablet 200 mg  200 mg Oral Daily Karmen Bongo, MD   200 mg at 04/09/19 0936  . docusate sodium (COLACE) capsule 100 mg  100 mg Oral BID Karmen Bongo, MD   100 mg at 04/09/19 0935  . enoxaparin (LOVENOX) injection 30 mg  30 mg Subcutaneous Q24H Karmen Bongo, MD   30 mg at 04/09/19 0950  . HYDROmorphone (DILAUDID) injection 0.5 mg  0.5 mg Intravenous Q2H PRN Karmen Bongo, MD   0.5 mg at 04/08/19 2139  . lidocaine (LIDODERM) 5 % 2 patch  2 patch Transdermal Q24H Karmen Bongo, MD   2 patch at 04/09/19 [phone]  . methocarbamol (ROBAXIN) tablet 750 mg  750 mg Oral QID Karmen Bongo, MD   750 mg at 04/09/19 0934  . metoprolol succinate (TOPROL-XL) 24 hr tablet 75 mg  75 mg Oral Daily Karmen Bongo, MD   75 mg at 04/09/19 0934  . mometasone-formoterol (DULERA) 200-5 MCG/ACT inhaler 2 puff  2 puff Inhalation BID Karmen Bongo, MD   2 puff at 04/09/19 [phone]  . montelukast (SINGULAIR) tablet 10 mg  10 mg Oral Ivery Quale, MD   10 mg at 04/08/19 2137  . ondansetron (ZOFRAN) tablet 4 mg  4 mg Oral Q6H PRN Karmen Bongo, MD       Or  . ondansetron [facility]) injection 4 mg  4 mg Intravenous Q6H PRN Karmen Bongo, MD      . oxyCODONE (Oxy IR/ROXICODONE) immediate release tablet  5-10 mg  5-10 mg Oral Q6H PRN Karmen Bongo, MD   10 mg at 04/08/19 1700  . pantoprazole (PROTONIX) EC tablet 40 mg  40 mg Oral BID Karmen Bongo, MD   40 mg at 04/09/19 0935  . polyethylene glycol (MIRALAX / GLYCOLAX) packet 17 g  17 g Oral Daily Saverio Danker, PA-C      . pravastatin (PRAVACHOL) tablet 40 mg  40 mg Oral QPM Karmen Bongo, MD   40 mg at 04/08/19 1707  . sertraline (ZOLOFT) tablet 100 mg  100 mg Oral Daily Karmen Bongo, MD   100 mg at 04/09/19 0936  . sodium chloride flush (NS) 0.9 % injection 3 mL  3 mL Intravenous Q12H Karmen Bongo, MD   3 mL at 04/09/19 0940  . traMADol (ULTRAM) tablet 50 mg  50 mg Oral TID PRN Karmen Bongo, MD   50 mg at 04/09/19 1884     Discharge Medications: Please see discharge summary for a list of discharge medications.  Relevant Imaging Results:  Relevant Lab Results:  Additional Information [id number] [place], [place]

## 2019-04-09 NOTE — Plan of Care (Signed)
  Problem: Education: Goal: Knowledge of General Education information will improve Description: Including pain rating scale, medication(s)/side effects and non-pharmacologic comfort measures 04/09/2019 0756 by Melina Schools, RN Outcome: Progressing 04/09/2019 0749 by Melina Schools, RN Outcome: Progressing   Problem: Health Behavior/Discharge Planning: Goal: Ability to manage health-related needs will improve 04/09/2019 0756 by Melina Schools, RN Outcome: Progressing 04/09/2019 0749 by Melina Schools, RN Outcome: Progressing   Problem: Clinical Measurements: Goal: Ability to maintain clinical measurements within normal limits will improve 04/09/2019 0756 by Melina Schools, RN Outcome: Progressing 04/09/2019 0749 by Melina Schools, RN Outcome: Progressing Goal: Will remain free from infection Outcome: Progressing

## 2019-04-09 NOTE — Progress Notes (Signed)
Occupational Therapy Treatment Patient Details Name: [name] MRN: [id number] DOB: Apr 25, 1940 Today's Date: 04/09/2019    History of present illness 79 yo admitted after fall in kitchen hitting a chair with left 7-10 rib fx. PMhx: HTN, HLD, gout, CHF, COPD, CKD, depression, macular degeneration   OT comments  Pt progressing well toward stated OT goals. States she has decided SNF rehab post acute is good option to safely improve BADLs prior to returning home. Pt on 2L [place] throughout session. Bed mobility completed at mod A, mostly limited by pain. Sit <> stands completed with min A and cues for safe hand placement. Pt then completed functional mobility with RW into bathroom and stood at sink to complete 2 grooming tasks. Pt with DOE 3/4 and needing cues to pace self, pursed lip breathing, and have seated rest break. While seated pt completed toileting on [facility] and additional grooming task. With min A, pt was able to complete anterior peri hygiene. Rib pain and poor activity tolerance continues to be main limitation in BADL progression. Updated recommendations to SNF level therapies post acute. Will continue to follow.    Follow Up Recommendations  SNF;Supervision/Assistance - 24 hour    Equipment Recommendations  3 in 1 bedside commode    Recommendations for Other Services      Precautions / Restrictions Precautions Precautions: Fall Restrictions Weight Bearing Restrictions: No       Mobility Bed Mobility Overal bed mobility: Needs Assistance Bed Mobility: Supine to Sit;Sit to Supine     Supine to sit: Mod assist Sit to supine: Mod assist   General bed mobility comments: mod A for BLE sequencing and trunk support; greater assist for BLEs back to bed  Transfers Overall transfer level: Needs assistance Equipment used: None Transfers: Sit to/from Stand;Stand Pivot Transfers Sit to Stand: Min assist Stand pivot transfers: Min assist       General transfer comment: min A  to rise and steady, cues for safe hand placement on RW    Balance Overall balance assessment: Needs assistance   Sitting balance-Leahy Scale: Good     Standing balance support: Bilateral upper extremity supported;During functional activity Standing balance-Leahy Scale: Poor Standing balance comment: reliant on external support from walker or sink when brushing teeth                           ADL either performed or assessed with clinical judgement   ADL Overall ADL's : Needs assistance/impaired     Grooming: Min guard;Standing;Sitting Grooming Details (indicate cue type and reason): Pt stood at sink to complete oral care and wash face. Noted DOE 3/4 with standing tasks. Pt needing rest break to finish combing hair in seated position                 Toilet Transfer: Minimal assistance;Ambulation;BSC;RW Toilet Transfer Details (indicate cue type and reason): Pt completed functional mobility into bathroom and sat on BSC (due to needing it for rest break at sink as well) Toileting- Clothing Manipulation and Hygiene: Minimal assistance;Sit to/from stand Toileting - Clothing Manipulation Details (indicate cue type and reason): assist with clothing manipulation      Functional mobility during ADLs: Minimal assistance;Rolling walker General ADL Comments: pt able to progress in distance of functional mobility and activity tolerance for BADL routine     Vision Baseline Vision/History: Wears glasses;Macular Degeneration Wears Glasses: At all times Patient Visual Report: No change from baseline  Perception     Praxis      Cognition Arousal/Alertness: Awake/alert Behavior During Therapy: WFL for tasks assessed/performed Overall Cognitive Status: Within Functional Limits for tasks assessed                                 General Comments: pt WFL with cognition this date, conversing about past occupation and asking appropriate questions about rehab  and her progress        Exercises     Shoulder Instructions       General Comments      Pertinent Vitals/ Pain       Pain Assessment: 0-10 Pain Score: 6  Pain Location: left chest/ribs  Pain Descriptors / Indicators: Aching;Guarding;Tender Pain Intervention(s): Monitored during session;Repositioned;Limited activity within patient's tolerance;Patient requesting pain meds-RN notified  Home Living                                          Prior Functioning/Environment              Frequency  Min 2X/week        Progress Toward Goals  OT Goals(current goals can now be found in the care plan section)  Progress towards OT goals: Progressing toward goals  Acute Rehab OT Goals Patient Stated Goal: to have less pain  OT Goal Formulation: With patient Time For Goal Achievement: 04/22/19 Potential to Achieve Goals: Good  Plan Discharge plan needs to be updated    Co-evaluation                 AM-PAC OT "6 Clicks" Daily Activity     Outcome Measure   Help from another person eating meals?: None Help from another person taking care of personal grooming?: A Little Help from another person toileting, which includes using toliet, bedpan, or urinal?: A Little Help from another person bathing (including washing, rinsing, drying)?: A Lot Help from another person to put on and taking off regular upper body clothing?: A Lot Help from another person to put on and taking off regular lower body clothing?: A Lot 6 Click Score: 16    End of Session Equipment Utilized During Treatment: Gait belt;Rolling walker;Oxygen  OT Visit Diagnosis: Unsteadiness on feet (R26.81);Pain Pain - Right/Left: Left Pain - part of body: (ribs)   Activity Tolerance Patient tolerated treatment well   Patient Left in bed;with call bell/phone within reach;with bed alarm set   Nurse Communication Mobility status        Time: 1660-6004 OT Time Calculation (min): 42  min  Charges: OT General Charges $OT Visit: 1 Visit OT Treatments $Self Care/Home Management : 38-52 mins  Zenovia Jarred, MSOT, OTR/L [place] [facility] Office Number: [phone] Pager: [phone]  Zenovia Jarred 04/09/2019, 5:40 PM

## 2019-04-10 ENCOUNTER — Encounter (HOSPITAL_COMMUNITY): Payer: Self-pay | Admitting: Internal Medicine

## 2019-04-10 MED ORDER — ALUM & MAG HYDROXIDE-SIMETH 200-200-20 MG/5ML PO SUSP
15.0000 mL | Freq: Once | ORAL | Status: AC
  Administered 2019-04-10: 15 mL via ORAL
  Filled 2019-04-10: qty 30

## 2019-04-10 MED ORDER — GUAIFENESIN ER 600 MG PO TB12
600.0000 mg | ORAL_TABLET | Freq: Two times a day (BID) | ORAL | Status: DC
  Administered 2019-04-10 – 2019-04-14 (×9): 600 mg via ORAL
  Filled 2019-04-10 (×9): qty 1

## 2019-04-10 MED ORDER — BISACODYL 5 MG PO TBEC
10.0000 mg | DELAYED_RELEASE_TABLET | Freq: Once | ORAL | Status: AC
  Administered 2019-04-11: 10 mg via ORAL
  Filled 2019-04-10: qty 2

## 2019-04-10 NOTE — TOC Progression Note (Signed)
Transition of Care [facility]) - Progression Note    Patient Details  Name: CHARLYNN SALIH MRN: [id number] Date of Birth: [birth year]  Transition of Care [facility]) CM/SW [place], [place] Phone Number: [phone] 04/10/2019, 11:00 AM  Clinical Narrative:     CSW spoke to patient via phone to confirm that she was agreeable to the referrals being faxed out. Patient informed CSW that she preferred [facility]. CSW faxed out referrals.  TOC team will continue to follow for discharge planning needs.  Expected Discharge Plan: Skilled Nursing Facility Barriers to Discharge: Continued Medical Work up, Ship broker, Environmental education officer)  Expected Discharge Plan and Services Expected Discharge Plan: [place]   Discharge Planning Services: CM Consult Post Acute Care Choice: [place] Living arrangements for the past 2 months: Single Family Home                                       Social Determinants of Health (SDOH) Interventions    Readmission Risk Interventions Readmission Risk Prevention Plan 04/09/2019  Transportation Screening Complete  PCP or Specialist Appt within 3-5 Days Not Complete  Not Complete comments plan for SNF  HRI or [place] Not Complete  HRI or Home Care Consult comments plan for SNF  Social Work Consult for [place] Planning/Counseling Complete  Palliative Care Screening Not Applicable  Medication Review (RN Care Manager) Referral to Pharmacy  Some recent data might be hidden

## 2019-04-10 NOTE — Evaluation (Signed)
Clinical/Bedside Swallow Evaluation Patient Details  Name: [name] MRN: [id number] Date of Birth: 23-Feb-1940  Today's Date: 04/10/2019 Time: SLP Start Time (ACUTE ONLY): 1010 SLP Stop Time (ACUTE ONLY): 1030 SLP Time Calculation (min) (ACUTE ONLY): 20 min  Past Medical History:  Past Medical History:  Diagnosis Date  . Abnormal glucose tolerance test   . Abnormal TSH 09/22/2016  . ACE-inhibitor cough   . Anemia 03/12/2014  . Arthritis   . Asthma    PFT 02/06/09 FEV1 1.41 (65%), FVC 1.92 (64), FEV1% 74, TLC 3.47 (71%), DLCO 48%, +BD  . Atypical chest pain    s/p cath, Normal coronaries, Non ST elevation myocardial infarction, Rt groin pseudoaneurysm  . Bacterial vaginosis 03/12/2014  . Cellulitis 06/22/2016  . Chronic kidney disease (CKD), stage III (moderate) 08/04/2016  . COPD (chronic obstructive pulmonary disease) ([place])   . Depression   . Diastolic heart failure ([place]) 04/07/2016  . DVT (deep venous thrombosis) ([place]) 1987  . GERD (gastroesophageal reflux disease)   . Gout   . Hypercalcemia 10/15/2014  . Hyperlipidemia   . Hypertension   . Hypoxia 10/15/2014  . Macular degeneration 04/10/2015  . Osteopenia 12/29/2006   Qualifier: Diagnosis of  By: Wynona Luna   . Pneumonia   . Polymyalgia rheumatica ([place]) 01/07/2016  . Renal insufficiency 09/22/2016  . Unspecified constipation 06/05/2013  . Vitamin D deficiency 01/01/2015   Past Surgical History:  Past Surgical History:  Procedure Laterality Date  . APPENDECTOMY  1951  . TONSILLECTOMY  1950  . TUBAL LIGATION  1968   HPI:  [name] is a 79 y.o. female with medical history significant of PMR; HTN; HLD; gout; hypercalcemia; DVT; chronic diastolic CHF; COPD; stage 3 CKD presenting with a mechanical fall.  She reports that she got up to go to the restroom and went to get water in the kitchen.  She slipped and fell in the kitchen floor, hitting a chair on the way down.  She hurt her left ribs and back.  She is having  ongoing severe pain with movement.  She also has a bruise on her leg.  She was not light-headed or dizzy.  Chest CT was showing the following:  Acute minimally displaced left seventh through tenth rib fractures which are segmental from the eighth through tenth ribs with fracture seen both laterally and posteriorly.  Additional chronic left-sided rib fractures noted as well. Given contiguous segmental fractures, correlate for flail chest morphology.  2. Trace left pleural thickening may reflect small amount of pleural fluid. No pneumothorax.  3. Apical reticular features suggest some component of chronic interstitial disease likely with superimposed interstitial edematous change. 4. 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  5. Coronary artery calcifications.   Assessment / Plan / Recommendation Clinical Impression  Clinical swallowing evaluation was completed using thin liquids via spoon, cup and straw, pureed material and dry solids.  The RN did not report any issues while taking her medication this AM.  The patient endorses increased mucous production adn the feeling that things do not want to go down at times.  She was unable to report what her mucous looked like today.  Cranial nerve exam was completed and unremarkable.  Lingual, labial, jaw and facial range of motion and strength appeared adequate.  Facial sensation was intact and she did not endorse any difference in sensation from the left to right side of her face.  Her swallow function appears to be functional.  Mastication of dry solids appeared to be adequate.  Swallow trigger was appreciated to palpation and overt s/s of aspiration were not seen.  She was unable to complete a 3 oz water challenge.  Recommend that she remain on a regular textured diet with thin liquids.  ST will follow briefly during acute stay for therapeutic diet tolerance.   SLP Visit Diagnosis: Dysphagia, unspecified (R13.10)     Aspiration Risk  Mild aspiration risk    Diet Recommendation   Regular with thin liquids  Medication Administration: Whole meds with liquid    Other  Recommendations Oral Care Recommendations: Oral care BID   Follow up Recommendations Other (comment)(TBD)      Frequency and Duration min 2x/week  2 weeks       Prognosis Prognosis for Safe Diet Advancement: Good      Swallow Study   General Date of Onset: 04/07/19 HPI: [name] is a 79 y.o. female with medical history significant of PMR; HTN; HLD; gout; hypercalcemia; DVT; chronic diastolic CHF; COPD; stage 3 CKD presenting with a mechanical fall.  She reports that she got up to go to the restroom and went to get water in the kitchen.  She slipped and fell in the kitchen floor, hitting a chair on the way down.  She hurt her left ribs and back.  She is having ongoing severe pain with movement.  She also has a bruise on her leg.  She was not light-headed or dizzy.  Chest CT was showing the following:  Acute minimally displaced left seventh through tenth rib fractures which are segmental from the eighth through tenth ribs with fracture seen both laterally and posteriorly.  Additional chronic left-sided rib fractures noted as well. Given contiguous segmental fractures, correlate for flail chest morphology.  2. Trace left pleural thickening may reflect small amount of pleural fluid. No pneumothorax.  3. Apical reticular features suggest some component of chronic interstitial disease likely with superimposed interstitial edematous change. 4. 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  5. Coronary artery calcifications. Type of Study: Bedside Swallow Evaluation Previous Swallow Assessment: None noted at [place]. Diet Prior to this Study: Regular;Thin liquids Temperature Spikes Noted: No Respiratory Status: Nasal cannula History of Recent Intubation: No Behavior/Cognition:  Alert;Cooperative;Pleasant mood Oral Cavity Assessment: Within Functional Limits Oral Care Completed by SLP: No Oral Cavity - Dentition: Dentures, top Vision: Functional for self-feeding Self-Feeding Abilities: Able to feed self Patient Positioning: Upright in bed Baseline Vocal Quality: Normal Volitional Cough: Other (Comment)(not tested) Volitional Swallow: Able to elicit    Oral/Motor/Sensory Function Overall Oral Motor/Sensory Function: Within functional limits   Ice Chips Ice chips: Not tested   Thin Liquid Thin Liquid: Within functional limits Presentation: Cup;Self Fed;Spoon;Straw    Nectar Thick Nectar Thick Liquid: Not tested   Honey Thick Honey Thick Liquid: Not tested   Puree Puree: Within functional limits Presentation: Spoon;Self Fed   Solid     Solid: Within functional limits Presentation: [place], [place], [place] Acute Rehab SLP [phone]  Lamar Sprinkles 04/10/2019,10:44 AM

## 2019-04-10 NOTE — Plan of Care (Signed)

## 2019-04-10 NOTE — Progress Notes (Signed)
Patient ID: [name], female   DOB: [birth year], 79 y.o.   MRN: [id number] [facility] Progress Note:   * No surgery found *  Subjective: Mental status is oriented Objective: Vital signs in last 24 hours: Temp:  [98.1 F (36.7 C)-98.6 F (37 C)] 98.6 F (37 C) (03/21 0941) Pulse Rate:  [73-89] 89 (03/21 0941) Resp:  [15-18] 15 (03/21 0941) BP: (116-139)/(59-70) 116/66 (03/21 0941) SpO2:  [95 %-100 %] 99 % (03/21 0941) FiO2 (%):  [28 %] 28 % (03/21 0821)  Intake/Output from previous day: 03/20 0701 - 03/21 0700 In: 480 [P.O.:480] Out: -  Intake/Output this shift: No intake/output data recorded.  Physical Exam: Work of breathing is not labored-resting without pain  Lab Results:  Results for orders placed or performed during the hospital encounter of 04/07/19 (from the past 48 hour(s))  CBC     Status: Abnormal   Collection Time: 04/09/19  3:34 AM  Result Value Ref Range   WBC 6.7 4.0 - 10.5 K/uL   RBC 3.17 (L) 3.87 - 5.11 MIL/uL   Hemoglobin 10.2 (L) 12.0 - 15.0 g/dL   HCT 32.9 (L) 36.0 - 46.0 %   MCV 103.8 (H) 80.0 - 100.0 fL   MCH 32.2 26.0 - 34.0 pg   MCHC 31.0 30.0 - 36.0 g/dL   RDW 13.1 11.5 - 15.5 %   Platelets 80 (L) 150 - 400 K/uL    Comment: REPEATED TO VERIFY SPECIMEN CHECKED FOR CLOTS Immature Platelet Fraction may be clinically indicated, consider ordering this additional test [id number] CONSISTENT WITH PREVIOUS RESULT    nRBC 0.0 0.0 - 0.2 %    Comment: Performed at [facility], [place] [address] [place], [place] [address]  Basic metabolic panel     Status: Abnormal   Collection Time: 04/09/19  3:34 AM  Result Value Ref Range   Sodium 142 135 - 145 mmol/L   Potassium 4.5 3.5 - 5.1 mmol/L   Chloride 105 98 - 111 mmol/L   CO2 29 22 - 32 mmol/L   Glucose, Bld 135 (H) 70 - 99 mg/dL    Comment: Glucose reference range applies only to samples taken after fasting for at least 8 hours.   BUN 33 (H) 8 - 23 mg/dL   Creatinine, Ser 1.51  (H) 0.44 - 1.00 mg/dL   Calcium 9.3 8.9 - 10.3 mg/dL   GFR calc non Af Amer 33 (L) >60 mL/min   GFR calc Af Amer 38 (L) >60 mL/min   Anion gap 8 5 - 15    Comment: Performed at [facility] [address] [place], [place] [address]    Radiology/Results: No results found.  Anti-infectives: Anti-infectives (From admission, onward)   None      Assessment/Plan: Problem List: Patient Active Problem List   Diagnosis Date Noted  . Rib fracture 04/07/2019  . Chronic asthma, mild persistent, uncomplicated 40/97/3532  . High serum vitamin B12 11/20/2017  . Subarachnoid hemorrhage ([place]) 11/05/2017  . Intracranial hemorrhage ([place]) 11/04/2017  . Syncope and collapse 11/04/2017  . Thrombocytopenia ([place]) 10/16/2017  . 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 Vitamin D deficiency 06/16/2017  . Hypernatremia 12/25/2016  . Nonintractable headache 10/26/2016  . Leukocytes in urine 10/26/2016  . Peripheral arterial disease ([place]) 10/01/2016  . Renal insufficiency 09/22/2016  . Abnormal TSH 09/22/2016  . 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[place]) 08/04/2016  . Heart murmur 04/25/2016  . Chronic combined systolic and diastolic CHF (congestive  heart failure) ([place]) 04/07/2016  . Bronchitis 03/06/2016  . Polymyalgia rheumatica ([place]) 01/07/2016  . Pedal edema 08/28/2015  . Chronic respiratory failure with hypoxia and hypercapnia ([place]) 06/28/2015  . Macular degeneration 04/10/2015  . Fall at home, initial encounter 12/04/2014  . Hypercalcemia 10/15/2014  . Hypoxia 10/15/2014  . 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 Cervical cancer screening 02/28/2014  . Eustachian tube dysfunction 01/29/2014  . Medicare annual wellness visit, subsequent 12/04/2013  . Constipation 06/05/2013  . Shortness of breath 03/07/2013  . Tachycardia 02/23/2013  . Sun-damaged skin 10/24/2012  . Lower urinary tract infectious disease 10/24/2012  . Back pain 04/07/2011  .  Allergic rhinitis 05/23/2009  . Gout 01/11/2008  . VARICOSE VEINS, LOWER EXTREMITIES 06/01/2007  . ADJ DISORDER WITH MIXED ANXIETY & DEPRESSED MOOD 03/25/2007  . Hyperlipidemia, mixed 12/29/2006  . Essential hypertension 12/29/2006  . Osteopenia 12/29/2006  . Abdominal pain 12/29/2006  . Diabetes type 2, controlled ([place]) 12/29/2006  . Asthma, moderate persistent 08/21/2006  . GERD 08/21/2006  . Osteoarthritis 08/21/2006  . Phlebitis and thrombophlebitis 08/21/2006    Deconditioned after fall;  Disposition per Dr. [place] * No surgery found *    LOS: 3 days   Matt B. Hassell Done, MD, [facility] [phone] beeper [phone]  04/10/2019 10:12 AM

## 2019-04-10 NOTE — Progress Notes (Signed)
PROGRESS NOTE    [name]  JEH:[id number] DOB: 08-May-1940 DOA: 04/07/2019 PCP: Mosie Lukes, MD    Brief Narrative:  [name] is a 79 year old Caucasian female with past medical history remarkable for PMR, HTN, HLD, gout, hypercalcemia, history of DVT, chronic diastolic congestive heart failure, COPD on 2 L [place] baseline, CKD stage III who presented with mechanical fall and rib pain.  States was getting up to go to the restroom and went to get water in the kitchen and slipped and fell on the kitchen floor hitting a chair on the way down.  Patient sustained pain to her left ribs and back.  Presenting with severe pain with movement.  Denies syncopal episode.  In ED was noted to have significant rib fractures on the left, trauma surgery consulted.  Medicine consulted for admission for pain control and mobility assessment.   Assessment & Plan:   Principal Problem:   Rib fracture Active Problems:   Hyperlipidemia, mixed   Gout   Essential hypertension   Diabetes type 2, controlled ([place])   Fall at home, initial encounter   Chronic respiratory failure with hypoxia and hypercapnia (HCC)   Polymyalgia rheumatica (HCC)   Chronic combined systolic and diastolic CHF (congestive heart failure) (HCC)   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HCC)   Acute Left 7-10 contiguous rib fractures Evidence of chronic rib fractures Patient presenting to the ED with chest wall pain following mechanical fall resulting in left-sided rib fractures.  Rib fractures contiguous with concern of flail chest; which is not apparent at this time. --Trauma surgery following, appreciate assistance --Tylenol 1 g every 6 hours, lidocaine patch, Robaxin 750 mg PO QID --oxycodone 5-'10mg'$  q6h prn --Dilaudid 0.'5mg'$  IV q2h prn for severe breakthrough pain --Pulmonary toilet with incentive spirometry, flutter valve --PT/OT noted max assist, recommends home health if 24-hour care, although patient lives alone, and patient  interested in SNF placement to achieve her previous baseline mobility/activity. --TOC consulted for SNF placement  Chronic diastolic congestive heart failure; currently compensated TTE 10/2017 with preserved EF and grade 1 diastolic dysfunction.  On furosemide '20mg'$  PO daily at home.  --Hold home furosemide for now  Chronic hypoxic respiratory failure COPD on 2L [place] baseline --Continue Symbicort, Claritin, Singulair --albuterol nebs prn --Currently oxygenating well on 2 L nasal cannula  CKD stage IV Baseline GFR last year 23-29 with creatinine 1.7 - 2.0 over the past year. --Creatinine 1.51; improved from baseline --Avoid nephrotoxins, renally dose all medications  HLD: Continue statin  Essential hypertension: Continue metoprolol succinate 75 mg p.o. daily  Depression: Continue sertraline 100 mg p.o. daily  GERD: Continue Protonix 40 mg p.o. twice daily  Gout: Continue allopurinol 20 mg p.o. daily   DVT prophylaxis: Lovenox Code Status: Full code Family Communication: Discussed with patient extensively at bedside Disposition Plan:      Patient is from: Home alone     Anticipated Disposition:  SNF placement     Barriers to discharge or conditions that needs to be met prior to discharge: Unsafe discharge home as patient requires max assistance for ADLs per PT/OT, patient lives alone, pending SNF placement, transition of care consulted.  Consultants:   Trauma surgery  Procedures:   None  Antimicrobials:   None   Subjective: Patient seen and examined bedside, resting comfortably.  Continues with left sided rib pain today.  Continues to require significant assistance for activities of daily living which is far from her baseline.  Patient concerned that she lives  alone, does have family close by but they are unlikely to be able to be with her for an extended period of time and she wishes to proceed with SNF placement to achieve back her previous mobility/independence.  No  other complaints or concerns at this time. Denies headache, no fever/chills/night sweats, no nausea/vomiting/diarrhea, no chest pain, palpitations, no shortness of breath, no abdominal pain, no weakness, no fatigue, no paresthesias.  No acute events overnight per nursing staff.  Objective: Vitals:   04/10/19 0347 04/10/19 0816 04/10/19 0821 04/10/19 0941  BP: (!) 120/59   116/66  Pulse: 78   89  Resp: 18   15  Temp: 98.1 F (36.7 C)   98.6 F (37 C)  TempSrc: Oral   Oral  SpO2: 98% 98% 98% 99%  Weight:      Height:        Intake/Output Summary (Last 24 hours) at 04/10/2019 1113 Last data filed at 04/10/2019 0400 Gross per 24 hour  Intake 360 ml  Output --  Net 360 ml   Filed Weights   04/07/19 0422  Weight: 84.4 kg    Examination:  General exam: Appears calm and comfortable  Respiratory system: Clear to auscultation. Respiratory effort normal.  Oxygenating 98% on 2L [place] which is her baseline Cardiovascular system: S1 & S2 heard, RRR. No JVD, murmurs, rubs, gallops or clicks. No pedal edema. Gastrointestinal system: Abdomen is nondistended, soft and nontender. No organomegaly or masses felt. Normal bowel sounds heard. Central nervous system: Alert and oriented. No focal neurological deficits. Extremities: Symmetric 5 x 5 power. Skin: No rashes, lesions or ulcers Psychiatry: Judgement and insight appear normal. Mood & affect appropriate.     Data Reviewed: I have personally reviewed following labs and imaging studies  CBC: Recent Labs  Lab 04/06/19 1105 04/07/19 0511 04/08/19 0340 04/09/19 0334  WBC 5.9 6.9 5.4 6.7  NEUTROABS 4.0 5.0  --   --   HGB 11.2* 11.4* 9.4* 10.2*  HCT 35.5* 36.6 30.7* 32.9*  MCV 102.0* 104.3* 103.0* 103.8*  PLT 108* 95* 78* 80*   Basic Metabolic Panel: Recent Labs  Lab 04/06/19 1105 04/07/19 0511 04/08/19 0340 04/09/19 0334  NA 141 143 144 142  K 4.7 4.4 4.8 4.5  CL 102 104 108 105  CO2 32 '28 28 29  '$ GLUCOSE 139* 135* 127* 135*   BUN 55* 57* 49* 33*  CREATININE 2.01* 1.81* 1.66* 1.51*  CALCIUM 9.7 9.4 9.2 9.3   GFR: Estimated Creatinine Clearance: 32.3 mL/min (A) (by C-G formula based on SCr of 1.51 mg/dL (H)). Liver Function Tests: Recent Labs  Lab 04/06/19 1105  AST 12*  ALT 8  ALKPHOS 44  BILITOT 0.4  PROT 6.7  ALBUMIN 4.5   No results for input(s): LIPASE, AMYLASE in the last 168 hours. No results for input(s): AMMONIA in the last 168 hours. Coagulation Profile: No results for input(s): INR, PROTIME in the last 168 hours. Cardiac Enzymes: No results for input(s): CKTOTAL, CKMB, CKMBINDEX, TROPONINI in the last 168 hours. BNP (last 3 results) No results for input(s): PROBNP in the last 8760 hours. HbA1C: No results for input(s): HGBA1C in the last 72 hours. CBG: No results for input(s): GLUCAP in the last 168 hours. Lipid Profile: No results for input(s): CHOL, HDL, LDLCALC, TRIG, CHOLHDL, LDLDIRECT in the last 72 hours. Thyroid Function Tests: No results for input(s): TSH, T4TOTAL, FREET4, T3FREE, THYROIDAB in the last 72 hours. Anemia Panel: No results for input(s): VITAMINB12, FOLATE, FERRITIN, TIBC, IRON,  RETICCTPCT in the last 72 hours. Sepsis Labs: No results for input(s): PROCALCITON, LATICACIDVEN in the last 168 hours.  Recent Results (from the past 240 hour(s))  SARS CORONAVIRUS 2 (TAT 6-24 HRS) Nasopharyngeal Nasopharyngeal Swab     Status: None   Collection Time: 04/07/19  6:46 AM   Specimen: Nasopharyngeal Swab  Result Value Ref Range Status   SARS Coronavirus 2 NEGATIVE NEGATIVE Final    Comment: (NOTE) SARS-CoV-2 target nucleic acids are NOT DETECTED. The SARS-CoV-2 RNA is generally detectable in upper and lower respiratory specimens during the acute phase of infection. Negative results do not preclude SARS-CoV-2 infection, do not rule out co-infections with other pathogens, and should not be used as the sole basis for treatment or other patient management  decisions. Negative results must be combined with clinical observations, patient history, and epidemiological information. The expected result is Negative. Fact Sheet for Patients: SugarRoll.be Fact Sheet for Healthcare Providers: https://www.woods-mathews.com/ This test is not yet approved or cleared by the [place] FDA and  has been authorized for detection and/or diagnosis of SARS-CoV-2 by FDA under an Emergency Use Authorization (EUA). This EUA will remain  in effect (meaning this test can be used) for the duration of the COVID-19 declaration under Section 56 4(b)(1) of the Act, 21 U.S.C. section 360bbb-3(b)(1), unless the authorization is terminated or revoked sooner. Performed at [facility], [place] [address] [place], [place] [address]          Radiology Studies: No results found.      Scheduled Meds: . acetaminophen  1,000 mg Oral Q6H  . allopurinol  200 mg Oral Daily  . docusate sodium  100 mg Oral BID  . enoxaparin (LOVENOX) injection  30 mg Subcutaneous Q24H  . guaiFENesin  600 mg Oral BID  . lidocaine  2 patch Transdermal Q24H  . methocarbamol  750 mg Oral QID  . metoprolol succinate  75 mg Oral Daily  . mometasone-formoterol  2 puff Inhalation BID  . montelukast  10 mg Oral QHS  . pantoprazole  40 mg Oral BID  . polyethylene glycol  17 g Oral Daily  . pravastatin  40 mg Oral QPM  . sertraline  100 mg Oral Daily  . sodium chloride flush  3 mL Intravenous Q12H   Continuous Infusions:   LOS: 3 days    Time spent: 34 minutes spent on chart review, discussion with nursing staff, consultants, updating family and interview/physical exam; more than 50% of that time was spent in counseling and/or coordination of care.    Konni Kesinger J [place] DO [facility] Available via Epic secure chat 7am-7pm After these hours, please refer to coverage provider listed on [email or web address] 04/10/2019, 11:13 AM

## 2019-04-11 MED ORDER — ENOXAPARIN SODIUM 40 MG/0.4ML ~~LOC~~ SOLN
40.0000 mg | SUBCUTANEOUS | Status: DC
  Administered 2019-04-11 – 2019-04-14 (×4): 40 mg via SUBCUTANEOUS
  Filled 2019-04-11 (×4): qty 0.4

## 2019-04-11 NOTE — Progress Notes (Signed)
Patient ID: [name], female   DOB: 19-Jun-1940, 79 y.o.   MRN: [id number]       Subjective: Patient sitting up in bed getting ready to get up to void in the [facility].  States that she otherwise hasn't mobilized passed the bathroom this admit.  On O2, but chronically and stable.  Rib pain seems controlled.  Eating.  ROS: See above, otherwise other systems negative  Objective: Vital signs in last 24 hours: Temp:  [98.1 F (36.7 C)-98.8 F (37.1 C)] 98.5 F (36.9 C) (03/22 0727) Pulse Rate:  [77-89] 78 (03/22 0727) Resp:  [15-18] 17 (03/22 0727) BP: (101-139)/(57-76) 139/62 (03/22 0727) SpO2:  [97 %-99 %] 98 % (03/22 0906) Last BM Date: 04/06/19  Intake/Output from previous day: 03/21 0701 - 03/22 0700 In: 480 [P.O.:480] Out: -  Intake/Output this shift: Total I/O In: 240 [P.O.:240] Out: -   PE: Gen: NAD HEENT; PERRL Heart: regular Lungs: CTAB, tender on left chest wall, on O2 at baseline Abd: soft, NT, ND MS: MAE Psych: A&Ox3  Lab Results:  Recent Labs    04/09/19 0334  WBC 6.7  HGB 10.2*  HCT 32.9*  PLT 80*   BMET Recent Labs    04/09/19 0334  NA 142  K 4.5  CL 105  CO2 29  GLUCOSE 135*  BUN 33*  CREATININE 1.51*  CALCIUM 9.3   PT/INR No results for input(s): LABPROT, INR in the last 72 hours. CMP     Component Value Date/Time   NA 142 04/09/2019 0334   NA 143 07/21/2017 1501   K 4.5 04/09/2019 0334   CL 105 04/09/2019 0334   CO2 29 04/09/2019 0334   GLUCOSE 135 (H) 04/09/2019 0334   GLUCOSE 102 (H) 01/27/2006 1037   BUN 33 (H) 04/09/2019 0334   BUN 41 (H) 07/21/2017 1501   CREATININE 1.51 (H) 04/09/2019 0334   CREATININE 2.01 (H) 04/06/2019 1105   CREATININE 1.36 (H) 08/23/2013 1134   CALCIUM 9.3 04/09/2019 0334   PROT 6.7 04/06/2019 1105   PROT 6.6 02/01/2018 1256   ALBUMIN 4.5 04/06/2019 1105   ALBUMIN 3.9 02/01/2018 1256   AST 12 (L) 04/06/2019 1105   ALT 8 04/06/2019 1105   ALKPHOS 44 04/06/2019 1105   BILITOT 0.4 04/06/2019  1105   GFRNONAA 33 (L) 04/09/2019 0334   GFRNONAA 23 (L) 04/06/2019 1105   GFRAA 38 (L) 04/09/2019 0334   GFRAA 27 (L) 04/06/2019 1105   Lipase  No results found for: LIPASE     Studies/Results: No results found.  Anti-infectives: Anti-infectives (From admission, onward)   None       Assessment/Plan GLF L Rib fx 7-10 with 8-10 possible flail - PT/OT, pain control, IS, agree with SNF placement as patient lives by herself HTN - per medicine HLD GERD - protonix CHF - per medicine COPD - on chronic O2 at home CKD3 FEN - regular diet VTE - lovenox ID - none Dispo - patient stable from a trauma standpoint for DC to SNF   LOS: 4 days    Henreitta Cea , [facility] 04/11/2019, 9:23 AM Please see Amion for pager number during day hours 7:00am-4:30pm or 7:00am -11:30am on weekends

## 2019-04-11 NOTE — Progress Notes (Signed)
Physical Therapy Treatment Patient Details Name: [name] MRN: [id number] DOB: 29-May-1940 Today's Date: 04/11/2019    History of Present Illness 79 yo admitted after fall in kitchen hitting a chair with left 7-10 rib fx. PMhx: HTN, HLD, gout, CHF, COPD, CKD, depression, macular degeneration    PT Comments    Pt supine on arrival with request to walk into bathroom. PT with decreased assist needed for transfers and able to progress gait distance but continues to need assist for bed mobility and rising from surfaces. Pt confirming family not able to provide physical support and SNF appropriate. Will continue to follow and encouraged OOB to bathroom not just BSC throughout the day.     Follow Up Recommendations  SNF;Supervision/Assistance - 24 hour     Equipment Recommendations  3in1 (PT)    Recommendations for Other Services       Precautions / Restrictions Precautions Precautions: Fall Restrictions Weight Bearing Restrictions: No    Mobility  Bed Mobility Overal bed mobility: Needs Assistance Bed Mobility: Rolling;Sidelying to Sit Rolling: Min assist Sidelying to sit: Mod assist       General bed mobility comments: min assist to roll right then mod to elevate trunk from surface and fully scoot to EOB  Transfers Overall transfer level: Needs assistance   Transfers: Sit to/from Stand Sit to Stand: Min assist         General transfer comment: min assist to rise from bed with cues for hand placement  Ambulation/Gait Ambulation/Gait assistance: Min guard Gait Distance (Feet): 14 Feet Assistive device: Rolling walker (2 wheeled) Gait Pattern/deviations: Step-through pattern;Decreased stride length;Trunk flexed   Gait velocity interpretation: 1.31 - 2.62 ft/sec, indicative of limited community ambulator General Gait Details: slow steps limited by fatigue with reliance on RW. Pt walked 14' then 10' with cues for posture and assist for O2 and guarding for  safety   Stairs             Wheelchair Mobility    Modified Rankin (Stroke Patients Only)       Balance Overall balance assessment: Needs assistance;History of Falls   Sitting balance-Leahy Scale: Good Sitting balance - Comments: EOB mod I   Standing balance support: Bilateral upper extremity supported;During functional activity Standing balance-Leahy Scale: Poor Standing balance comment: reliant on external support from walker                            Cognition Arousal/Alertness: Awake/alert Behavior During Therapy: WFL for tasks assessed/performed Overall Cognitive Status: Within Functional Limits for tasks assessed                                        Exercises      General Comments        Pertinent Vitals/Pain Pain Score: 6  Pain Location: left chest/ribs  Pain Descriptors / Indicators: Aching;Guarding;Tender Pain Intervention(s): Limited activity within patient's tolerance;Monitored during session;Premedicated before session;Repositioned;RN gave pain meds during session    Home Living                      Prior Function            PT Goals (current goals can now be found in the care plan section) Progress towards PT goals: Progressing toward goals    Frequency    Min 3X/week  PT Plan Current plan remains appropriate    Co-evaluation              AM-PAC PT "6 Clicks" Mobility   Outcome Measure  Help needed turning from your back to your side while in a flat bed without using bedrails?: A Little Help needed moving from lying on your back to sitting on the side of a flat bed without using bedrails?: A Lot Help needed moving to and from a bed to a chair (including a wheelchair)?: A Little Help needed standing up from a chair using your arms (e.g., wheelchair or bedside chair)?: A Little Help needed to walk in hospital room?: A Little Help needed climbing 3-5 steps with a railing? : A  Lot 6 Click Score: 16    End of Session Equipment Utilized During Treatment: Gait belt;Oxygen Activity Tolerance: Patient tolerated treatment well Patient left: with call bell/phone within reach;Other (comment) Nurse Communication: Mobility status PT Visit Diagnosis: Other abnormalities of gait and mobility (R26.89);Difficulty in walking, not elsewhere classified (R26.2)     Time: 0761-5183 PT Time Calculation (min) (ACUTE ONLY): 38 min  Charges:  $Gait Training: 8-22 mins $Therapeutic Activity: 8-22 mins                     Mark Benecke P, PT Acute Rehabilitation Services Pager: [phone] Office: [place] 04/11/2019, 11:35 AM

## 2019-04-11 NOTE — Progress Notes (Signed)
PROGRESS NOTE    [name]  JIR:[id number] DOB: 09-Feb-1940 DOA: 04/07/2019 PCP: Mosie Lukes, MD    Brief Narrative:  [name] is a 79 year old Caucasian female with past medical history remarkable for PMR, HTN, HLD, gout, hypercalcemia, history of DVT, chronic diastolic congestive heart failure, COPD on 2 L [place] baseline, CKD stage III who presented with mechanical fall and rib pain.  States was getting up to go to the restroom and went to get water in the kitchen and slipped and fell on the kitchen floor hitting a chair on the way down.  Patient sustained pain to her left ribs and back.  Presenting with severe pain with movement.  Denies syncopal episode.  In ED was noted to have significant rib fractures on the left, trauma surgery consulted.  Medicine consulted for admission for pain control and mobility assessment.   Assessment & Plan:   Principal Problem:   Rib fracture Active Problems:   Hyperlipidemia, mixed   Gout   Essential hypertension   Diabetes type 2, controlled ([place])   Fall at home, initial encounter   Chronic respiratory failure with hypoxia and hypercapnia (HCC)   Polymyalgia rheumatica (HCC)   Chronic combined systolic and diastolic CHF (congestive heart failure) (HCC)   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HCC)   Acute Left 7-10 contiguous rib fractures Evidence of chronic rib fractures Patient presenting to the ED with chest wall pain following mechanical fall resulting in left-sided rib fractures.  Rib fractures contiguous with concern of flail chest; which is not apparent at this time. --Trauma surgery following, appreciate assistance --Tylenol 1 g every 6 hours, lidocaine patch, Robaxin 750 mg PO QID --oxycodone 5-'10mg'$  q6h prn --Dilaudid 0.'5mg'$  IV q2h prn for severe breakthrough pain --Pulmonary toilet with incentive spirometry, flutter valve --PT/OT noted max assist, recommends home health if 24-hour care, although patient lives alone, and patient  interested in SNF placement to achieve her previous baseline mobility/activity. --TOC consulted for SNF placement  Chronic diastolic congestive heart failure; currently compensated TTE 10/2017 with preserved EF and grade 1 diastolic dysfunction.  On furosemide '20mg'$  PO daily at home.  --Hold home furosemide for now  Chronic hypoxic respiratory failure COPD on 2L [place] baseline --Continue Symbicort, Claritin, Singulair --albuterol nebs prn --Currently oxygenating well on 2 L nasal cannula  CKD stage IV Baseline GFR last year 23-29 with creatinine 1.7 - 2.0 over the past year. --Creatinine 1.51; improved from baseline --Avoid nephrotoxins, renally dose all medications  HLD: Continue statin  Essential hypertension: Continue metoprolol succinate 75 mg p.o. daily  Depression: Continue sertraline 100 mg p.o. daily  GERD: Continue Protonix 40 mg p.o. twice daily  Gout: Continue allopurinol 20 mg p.o. daily   DVT prophylaxis: Lovenox Code Status: Full code Family Communication: Discussed with patient extensively at bedside Disposition Plan:      Patient is from: Home alone     Anticipated Disposition:  SNF placement     Barriers to discharge or conditions that needs to be met prior to discharge: Unsafe discharge home as patient requires max assistance for ADLs per PT/OT, patient lives alone and wishes to get back to her previous baseline of independence, pending SNF placement.  Consultants:   Trauma surgery  Procedures:   None  Antimicrobials:   None   Subjective: Patient seen and examined bedside, resting comfortably.  Continues with left sided rib pain today surrounding her eccymosis.  Continues to require significant assistance for activities of daily living which is  far from her baseline.  No other complaints or concerns at this time. Denies headache, no fever/chills/night sweats, no nausea/vomiting/diarrhea, no chest pain, palpitations, no shortness of breath, no abdominal  pain, no weakness, no fatigue, no paresthesias.  No acute events overnight per nursing staff.  Objective: Vitals:   04/10/19 1523 04/10/19 1916 04/11/19 0337 04/11/19 0727  BP: (!) 123/57 101/76 128/69 139/62  Pulse: 79 77 86 78  Resp: '17 17 18 17  '$ Temp: 98.8 F (37.1 C) 98.4 F (36.9 C) 98.1 F (36.7 C) 98.5 F (36.9 C)  TempSrc: Oral Oral Oral Oral  SpO2: 99% 99% 99% 97%  Weight:      Height:        Intake/Output Summary (Last 24 hours) at 04/11/2019 0727 Last data filed at 04/10/2019 1700 Gross per 24 hour  Intake 480 ml  Output --  Net 480 ml   Filed Weights   04/07/19 0422  Weight: 84.4 kg    Examination:  General exam: Appears calm and comfortable  Respiratory system: Clear to auscultation. Respiratory effort normal.  Oxygenating 98% on 2L [place] which is her baseline Cardiovascular system: S1 & S2 heard, RRR. No JVD, murmurs, rubs, gallops or clicks. No pedal edema. Gastrointestinal system: Abdomen is nondistended, soft and nontender. No organomegaly or masses felt. Normal bowel sounds heard. Central nervous system: Alert and oriented. No focal neurological deficits. Extremities: Symmetric 5 x 5 power. Skin: ecchymosis noted left flank, No rashes, lesions or ulcers Psychiatry: Judgement and insight appear normal. Mood & affect appropriate.     Data Reviewed: I have personally reviewed following labs and imaging studies  CBC: Recent Labs  Lab 04/06/19 1105 04/07/19 0511 04/08/19 0340 04/09/19 0334  WBC 5.9 6.9 5.4 6.7  NEUTROABS 4.0 5.0  --   --   HGB 11.2* 11.4* 9.4* 10.2*  HCT 35.5* 36.6 30.7* 32.9*  MCV 102.0* 104.3* 103.0* 103.8*  PLT 108* 95* 78* 80*   Basic Metabolic Panel: Recent Labs  Lab 04/06/19 1105 04/07/19 0511 04/08/19 0340 04/09/19 0334  NA 141 143 144 142  K 4.7 4.4 4.8 4.5  CL 102 104 108 105  CO2 32 '28 28 29  '$ GLUCOSE 139* 135* 127* 135*  BUN 55* 57* 49* 33*  CREATININE 2.01* 1.81* 1.66* 1.51*  CALCIUM 9.7 9.4 9.2 9.3    GFR: Estimated Creatinine Clearance: 32.3 mL/min (A) (by C-G formula based on SCr of 1.51 mg/dL (H)). Liver Function Tests: Recent Labs  Lab 04/06/19 1105  AST 12*  ALT 8  ALKPHOS 44  BILITOT 0.4  PROT 6.7  ALBUMIN 4.5   No results for input(s): LIPASE, AMYLASE in the last 168 hours. No results for input(s): AMMONIA in the last 168 hours. Coagulation Profile: No results for input(s): INR, PROTIME in the last 168 hours. Cardiac Enzymes: No results for input(s): CKTOTAL, CKMB, CKMBINDEX, TROPONINI in the last 168 hours. BNP (last 3 results) No results for input(s): PROBNP in the last 8760 hours. HbA1C: No results for input(s): HGBA1C in the last 72 hours. CBG: No results for input(s): GLUCAP in the last 168 hours. Lipid Profile: No results for input(s): CHOL, HDL, LDLCALC, TRIG, CHOLHDL, LDLDIRECT in the last 72 hours. Thyroid Function Tests: No results for input(s): TSH, T4TOTAL, FREET4, T3FREE, THYROIDAB in the last 72 hours. Anemia Panel: No results for input(s): VITAMINB12, FOLATE, FERRITIN, TIBC, IRON, RETICCTPCT in the last 72 hours. Sepsis Labs: No results for input(s): PROCALCITON, LATICACIDVEN in the last 168 hours.  Recent Results (from  the past 240 hour(s))  SARS CORONAVIRUS 2 (TAT 6-24 HRS) Nasopharyngeal Nasopharyngeal Swab     Status: None   Collection Time: 04/07/19  6:46 AM   Specimen: Nasopharyngeal Swab  Result Value Ref Range Status   SARS Coronavirus 2 NEGATIVE NEGATIVE Final    Comment: (NOTE) SARS-CoV-2 target nucleic acids are NOT DETECTED. The SARS-CoV-2 RNA is generally detectable in upper and lower respiratory specimens during the acute phase of infection. Negative results do not preclude SARS-CoV-2 infection, do not rule out co-infections with other pathogens, and should not be used as the sole basis for treatment or other patient management decisions. Negative results must be combined with clinical observations, patient history, and  epidemiological information. The expected result is Negative. Fact Sheet for Patients: SugarRoll.be Fact Sheet for Healthcare Providers: https://www.woods-mathews.com/ This test is not yet approved or cleared by the [place] FDA and  has been authorized for detection and/or diagnosis of SARS-CoV-2 by FDA under an Emergency Use Authorization (EUA). This EUA will remain  in effect (meaning this test can be used) for the duration of the COVID-19 declaration under Section 56 4(b)(1) of the Act, 21 U.S.C. section 360bbb-3(b)(1), unless the authorization is terminated or revoked sooner. Performed at [facility], [place] [address] [place], [place] [address]          Radiology Studies: No results found.      Scheduled Meds: . acetaminophen  1,000 mg Oral Q6H  . allopurinol  200 mg Oral Daily  . docusate sodium  100 mg Oral BID  . enoxaparin (LOVENOX) injection  30 mg Subcutaneous Q24H  . guaiFENesin  600 mg Oral BID  . lidocaine  2 patch Transdermal Q24H  . methocarbamol  750 mg Oral QID  . metoprolol succinate  75 mg Oral Daily  . mometasone-formoterol  2 puff Inhalation BID  . montelukast  10 mg Oral QHS  . pantoprazole  40 mg Oral BID  . polyethylene glycol  17 g Oral Daily  . pravastatin  40 mg Oral QPM  . sertraline  100 mg Oral Daily  . sodium chloride flush  3 mL Intravenous Q12H   Continuous Infusions:   LOS: 4 days    Time spent: 34 minutes spent on chart review, discussion with nursing staff, consultants, updating family and interview/physical exam; more than 50% of that time was spent in counseling and/or coordination of care.    Cai Flott J [place] DO [facility] Available via Epic secure chat 7am-7pm After these hours, please refer to coverage provider listed on [email or web address] 04/11/2019, 7:27 AM

## 2019-04-11 NOTE — TOC Progression Note (Signed)
Transition of Care (TOC) - Progression Note    Patient Details  Name: Modesta J Gadberry MRN: [id number] Date of Birth: 05/01/1940  Transition of Care (TOC) CM/SW Contact  Miranda  Gainey, LCSWA Phone Number:[phone] 04/11/2019, 12:10 PM  Clinical Narrative:     CSW met with patient bedside, introduced herself and explained role. CSW completed SBIRT with pt. Pt denies alcohol use. PT did not need substance abuse counseling or resources.   Expected Discharge Plan: Skilled Nursing Facility Barriers to Discharge: Continued Medical Work up, Insurance Authorization, Awaiting State Approval (PASRR)  Expected Discharge Plan and Services Expected Discharge Plan: Skilled Nursing Facility   Discharge Planning Services: CM Consult Post Acute Care Choice: Skilled Nursing Facility Living arrangements for the past 2 months: Single Family Home                                       Social Determinants of Health (SDOH) Interventions    Readmission Risk Interventions Readmission Risk Prevention Plan 04/09/2019  Transportation Screening Complete  PCP or Specialist Appt within 3-5 Days Not Complete  Not Complete comments plan for SNF  HRI or Home Care Consult Not Complete  HRI or Home Care Consult comments plan for SNF  Social Work Consult for Recovery Care Planning/Counseling Complete  Palliative Care Screening Not Applicable  Medication Review (RN Care Manager) Referral to Pharmacy  Some recent data might be hidden   Miranda Gainey, LCSWA, LCASA Licensed Clinical Social Worker 

## 2019-04-11 NOTE — Plan of Care (Signed)
  Problem: Education: Goal: Knowledge of General Education information will improve Description Including pain rating scale, medication(s)/side effects and non-pharmacologic comfort measures Outcome: Progressing   

## 2019-04-12 LAB — CBC
HCT: 32.3 % — ABNORMAL LOW (ref 36.0–46.0)
Hemoglobin: 10 g/dL — ABNORMAL LOW (ref 12.0–15.0)
MCH: 32.2 pg (ref 26.0–34.0)
MCHC: 31 g/dL (ref 30.0–36.0)
MCV: 103.9 fL — ABNORMAL HIGH (ref 80.0–100.0)
Platelets: 100 10*3/uL — ABNORMAL LOW (ref 150–400)
RBC: 3.11 MIL/uL — ABNORMAL LOW (ref 3.87–5.11)
RDW: 13.4 % (ref 11.5–15.5)
WBC: 5.6 10*3/uL (ref 4.0–10.5)
nRBC: 0 % (ref 0.0–0.2)

## 2019-04-12 LAB — BASIC METABOLIC PANEL
Anion gap: 7 (ref 5–15)
BUN: 38 mg/dL — ABNORMAL HIGH (ref 8–23)
CO2: 27 mmol/L (ref 22–32)
Calcium: 8.9 mg/dL (ref 8.9–10.3)
Chloride: 109 mmol/L (ref 98–111)
Creatinine, Ser: 1.38 mg/dL — ABNORMAL HIGH (ref 0.44–1.00)
GFR calc Af Amer: 42 mL/min — ABNORMAL LOW (ref >60)
GFR calc non Af Amer: 37 mL/min — ABNORMAL LOW (ref >60)
Glucose, Bld: 129 mg/dL — ABNORMAL HIGH (ref 70–99)
Potassium: 4.2 mmol/L (ref 3.5–5.1)
Sodium: 143 mmol/L (ref 135–145)

## 2019-04-12 LAB — GLUCOSE, CAPILLARY: Glucose-Capillary: 96 mg/dL (ref 70–99)

## 2019-04-12 MED ORDER — MAGNESIUM CITRATE PO SOLN
1.0000 | Freq: Once | ORAL | Status: AC
  Administered 2019-04-12: 1 via ORAL
  Filled 2019-04-12: qty 296

## 2019-04-12 NOTE — Progress Notes (Signed)
Occupational Therapy Treatment Patient Details Name: [name] MRN: [id number] DOB: [birth year] Today's Date: 04/12/2019    History of present illness 79 yo admitted after fall in kitchen hitting a chair with left 7-10 rib fx. PMhx: HTN, HLD, gout, CHF, COPD, CKD, depression, macular degeneration   OT comments  Pt continues to progress toward established OT goals. Pt sitting EOB upon arrival, agreeable to OT session. Pt able to return demonstrate use of AE to assist with LB dressing with modA. She utilized incentive spirometer while sitting upright EOB and able to pull 250 consistently. Educated pt on importance of continuing to use IS. Pt will continue to benefit from skilled OT services to maximize safety and independence with ADL/IADL and functional mobility. Will continue to follow acutely and progress as tolerated.    Follow Up Recommendations  SNF;Supervision/Assistance - 24 hour    Equipment Recommendations  3 in 1 bedside commode    Recommendations for Other Services      Precautions / Restrictions Precautions Precautions: Fall Restrictions Weight Bearing Restrictions: No       Mobility Bed Mobility Overal bed mobility: Needs Assistance Bed Mobility: Sit to Supine       Sit to supine: Mod assist   General bed mobility comments: modA for BLE managment;maxA+2 to reposition pt higher in bed  Transfers Overall transfer level: Needs assistance Equipment used: None Transfers: Sit to/from Stand Sit to Stand: Min assist         General transfer comment: min assist to rise from bed with cues for hand placement    Balance Overall balance assessment: Needs assistance;History of Falls   Sitting balance-Leahy Scale: Good Sitting balance - Comments: EOB mod I   Standing balance support: Bilateral upper extremity supported;During functional activity Standing balance-Leahy Scale: Poor Standing balance comment: reliant on external support from walker                            ADL either performed or assessed with clinical judgement   ADL Overall ADL's : Needs assistance/impaired             Lower Body Bathing: Maximal assistance Lower Body Bathing Details (indicate cue type and reason): maxA to apply lotion below knee     Lower Body Dressing: Moderate assistance;With adaptive equipment Lower Body Dressing Details (indicate cue type and reason): pt able to return demonstrate use of sock aid and reacher to don/doff socks with modA  Toilet Transfer: Minimal assistance;Ambulation;BSC;RW Toilet Transfer Details (indicate cue type and reason): simulated;pt reports she has been ambulating to bathroom throughout the day         Functional mobility during ADLs: Minimal assistance;Rolling walker General ADL Comments: pt continues to demonstrate decreased activity tolerance;using incentive spirometer able to reach 250 consistently     Vision       Perception     Praxis      Cognition Arousal/Alertness: Awake/alert Behavior During Therapy: WFL for tasks assessed/performed Overall Cognitive Status: Within Functional Limits for tasks assessed                                          Exercises     Shoulder Instructions       General Comments SpO2 88% RA, pt took O2 out to blow her nose:SpO2 98% 1.5nc    Pertinent Vitals/ Pain  Pain Assessment: Faces Faces Pain Scale: Hurts little more Pain Location: left chest/ribs  Pain Descriptors / Indicators: Aching;Guarding;Tender Pain Intervention(s): Limited activity within patient's tolerance;Monitored during session  Home Living                                          Prior Functioning/Environment              Frequency  Min 2X/week        Progress Toward Goals  OT Goals(current goals can now be found in the care plan section)  Progress towards OT goals: Progressing toward goals  Acute Rehab OT Goals Patient Stated  Goal: to have less pain  OT Goal Formulation: With patient Time For Goal Achievement: 04/22/19 Potential to Achieve Goals: Good ADL Goals Pt Will Perform Grooming: with min guard assist;standing Pt Will Perform Upper Body Bathing: with set-up;with supervision;sitting Pt Will Perform Lower Body Bathing: with min guard assist;sit to/from stand;with adaptive equipment Pt Will Perform Upper Body Dressing: with set-up;sitting Pt Will Perform Lower Body Dressing: with min guard assist;sit to/from stand Pt Will Transfer to Toilet: with min guard assist;ambulating;regular height toilet;bedside commode;grab bars Pt Will Perform Toileting - Clothing Manipulation and hygiene: with min guard assist;sit to/from stand Pt Will Perform Tub/Shower Transfer: Shower transfer;with min guard assist;ambulating;3 in 1;rolling walker  Plan Discharge plan needs to be updated    Co-evaluation                 AM-PAC OT "6 Clicks" Daily Activity     Outcome Measure   Help from another person eating meals?: None Help from another person taking care of personal grooming?: A Little Help from another person toileting, which includes using toliet, bedpan, or urinal?: A Little Help from another person bathing (including washing, rinsing, drying)?: A Lot Help from another person to put on and taking off regular upper body clothing?: A Lot Help from another person to put on and taking off regular lower body clothing?: A Lot 6 Click Score: 16    End of Session Equipment Utilized During Treatment: Gait belt;Rolling walker;Oxygen  OT Visit Diagnosis: Unsteadiness on feet (R26.81);Pain Pain - Right/Left: Left Pain - part of body: (ribs)   Activity Tolerance Patient tolerated treatment well   Patient Left in bed;with call bell/phone within reach;with bed alarm set   Nurse Communication Mobility status        Time: 2878-6767 OT Time Calculation (min): 26 min  Charges: OT General Charges $OT Visit: 1  Visit OT Treatments $Self Care/Home Management : 23-37 mins  Helene Kelp OTR/L Acute Rehabilitation Services Office: [place] 04/12/2019, 3:58 PM

## 2019-04-12 NOTE — Progress Notes (Signed)
Please be advised that the above-named patient has a primary diagnosis of dementia which supersedes any psychiatric diagnoses and will require a short-term nursing home stay - anticipated 30 days or less for rehabilitation and strengthening.  The plan is for return home.

## 2019-04-12 NOTE — TOC Progression Note (Addendum)
Transition of Care [facility]) - Progression Note    Patient Details  Name: [name] MRN: [id number] Date of Birth: Jun 04, 1940  [facility]) CM/SW Contact  Sharin Mons, RN Phone Number: 04/12/2019, 10:01 AM  Clinical Narrative:    Insurance authorization in process. Still awaiting manuel PASSR. Pt's SNF choice , [facility]. [facility] reviewing for bed offer. Pt will need updated COVID. TOC team following and monitoring needs.....  04/12/19 @ [place] received [id number]. Insurance authorization pending for SNF .....  Expected Discharge Plan: [place] Barriers to Discharge: No Barriers Identified, Ship broker, [place] Forensic scientist)  Expected Discharge Plan and Services Expected Discharge Plan: [place]   Discharge Planning Services: CM Consult Post Acute Care Choice: [place] Living arrangements for the past 2 months: Single Family Home      Social Determinants of Health (SDOH) Interventions    Readmission Risk Interventions Readmission Risk Prevention Plan 04/09/2019  Transportation Screening Complete  PCP or Specialist Appt within 3-5 Days Not Complete  Not Complete comments plan for SNF  HRI or [place] Not Complete  HRI or Home Care Consult comments plan for SNF  Social Work Consult for [place] Planning/Counseling Complete  Palliative Care Screening Not Applicable  Medication Review (RN Care Manager) Referral to Pharmacy  Some recent data might be hidden

## 2019-04-12 NOTE — Progress Notes (Signed)
  Speech Language Pathology Treatment: Dysphagia  Patient Details Name: [name] MRN: [id number] DOB: 1940-03-13 Today's Date: 04/12/2019 Time: 1050-1105 SLP Time Calculation (min) (ACUTE ONLY): 15 min  Assessment / Plan / Recommendation Clinical Impression  SKilled observation complete with prescribed diet. Patient independently consuming pos with safe swallowing strategies and no overt indication of aspiration. Patient appears to be tolerating diet. No further SLP needs indicated.     HPI HPI: [name] is a 79 y.o. female with medical history significant of PMR; HTN; HLD; gout; hypercalcemia; DVT; chronic diastolic CHF; COPD; stage 3 CKD presenting with a mechanical fall.  She reports that she got up to go to the restroom and went to get water in the kitchen.  She slipped and fell in the kitchen floor, hitting a chair on the way down.  She hurt her left ribs and back.  She is having ongoing severe pain with movement.  She also has a bruise on her leg.  She was not light-headed or dizzy.  Chest CT was showing the following:  Acute minimally displaced left seventh through tenth rib fractures which are segmental from the eighth through tenth ribs with fracture seen both laterally and posteriorly.  Additional chronic left-sided rib fractures noted as well. Given contiguous segmental fractures, correlate for flail chest morphology.  2. Trace left pleural thickening may reflect small amount of pleural fluid. No pneumothorax.  3. Apical reticular features suggest some component of chronic interstitial disease likely with superimposed interstitial edematous change. 4. 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  5. Coronary artery calcifications.      SLP Plan  All goals met       Recommendations  Diet recommendations: Regular;Thin liquid Liquids provided via: Cup;Straw Medication Administration: Whole meds with liquid Supervision: Patient  able to self feed Compensations: Small sips/bites Postural Changes and/or Swallow Maneuvers: Seated upright 90 degrees                Oral Care Recommendations: Oral care BID Follow up Recommendations: None SLP Visit Diagnosis: Dysphagia, unspecified (R13.10) Plan: All goals met       GO             Gabriel Rainwater MA, CCC-SLP     [name] 04/12/2019, 11:10 AM

## 2019-04-12 NOTE — Progress Notes (Signed)
PROGRESS NOTE    [name]  GUY:[id number] DOB: 1940/04/07 DOA: 04/07/2019 PCP: Mosie Lukes, MD    Brief Narrative:  [name] is a 79 year old Caucasian female with past medical history remarkable for PMR, HTN, HLD, gout, hypercalcemia, history of DVT, chronic diastolic congestive heart failure, COPD on 2 L [place] baseline, CKD stage III who presented with mechanical fall and rib pain.  States was getting up to go to the restroom and went to get water in the kitchen and slipped and fell on the kitchen floor hitting a chair on the way down.  Patient sustained pain to her left ribs and back.  Presenting with severe pain with movement.  Denies syncopal episode.  In ED was noted to have significant rib fractures on the left, trauma surgery consulted.  Medicine consulted for admission for pain control and mobility assessment.   Assessment & Plan:   Principal Problem:   Rib fracture Active Problems:   Hyperlipidemia, mixed   Gout   Essential hypertension   Diabetes type 2, controlled ([place])   Fall at home, initial encounter   Chronic respiratory failure with hypoxia and hypercapnia (HCC)   Polymyalgia rheumatica (HCC)   Chronic combined systolic and diastolic CHF (congestive heart failure) (HCC)   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HCC)   Acute Left 7-10 contiguous rib fractures Evidence of chronic rib fractures Patient presenting to the ED with chest wall pain following mechanical fall resulting in left-sided rib fractures.  Rib fractures contiguous with concern of flail chest; which is not apparent at this time. --Trauma surgery following, appreciate assistance --Tylenol 1 g every 6 hours, lidocaine patch, Robaxin 750 mg PO QID --oxycodone 5-79m q6h prn --Dilaudid 0.554mIV q2h prn for severe breakthrough pain --Pulmonary toilet with incentive spirometry, flutter valve --PT/OT noted max assist, recommends home health if 24-hour care, although patient lives alone, and patient  interested in SNF placement to achieve her previous baseline mobility/activity. --TOC consulted for SNF placement  Chronic diastolic congestive heart failure; currently compensated TTE 10/2017 with preserved EF and grade 1 diastolic dysfunction.  On furosemide 2023mO daily at home.  --Hold home furosemide for now  Chronic hypoxic respiratory failure COPD on 2L [place] baseline --Continue Symbicort, Claritin, Singulair --albuterol nebs prn --Currently oxygenating well on 2 L nasal cannula  CKD stage IV Baseline GFR last year 23-29 with creatinine 1.7 - 2.0 over the past year. --Creatinine 1.38; improved from baseline --Avoid nephrotoxins, renally dose all medications  HLD: Continue statin  Essential hypertension: Continue metoprolol succinate 75 mg p.o. daily  Depression: Continue sertraline 100 mg p.o. daily  GERD: Continue Protonix 40 mg p.o. twice daily  Gout: Continue allopurinol 20 mg p.o. daily   DVT prophylaxis: Lovenox Code Status: Full code Family Communication: Discussed with patient extensively at bedside Disposition Plan:      Patient is from: Home alone     Anticipated Disposition:  SNF placement     Barriers to discharge or conditions that needs to be met prior to discharge: Unsafe discharge home as patient requires max assistance for ADLs per PT/OT, patient lives alone and wishes to get back to her previous baseline of independence, pending SNF placement.  Consultants:   Trauma surgery  Procedures:   None  Antimicrobials:   None   Subjective: Patient seen and examined bedside, resting comfortably.  Better sleep overnight.  Continues with pain to rib areas, slowly improving daily.  Continues to require significant assistance for activities of daily  living which is far from her baseline.  No other complaints or concerns at this time. Denies headache, no fever/chills/night sweats, no nausea/vomiting/diarrhea, no chest pain, palpitations, no shortness of  breath, no abdominal pain, no weakness, no fatigue, no paresthesias.  No acute events overnight per nursing staff.  Objective: Vitals:   04/11/19 0906 04/11/19 1539 04/11/19 1930 04/12/19 0409  BP:  134/74 (!) 109/57 (!) 116/57  Pulse:  79 79 70  Resp:  _0 Temp:  98.1 F (36.7 C) 98 F (36.7 C) (!) 97.2 F (36.2 C)  TempSrc:  Oral Oral Oral  SpO2: 98% 97% 98% 99%  Weight:      Height:        Intake/Output Summary (Last 24 hours) at 04/12/2019 0751 Last data filed at 04/11/2019 1300 Gross per 24 hour  Intake 240 ml  Output --  Net 240 ml   Filed Weights   04/07/19 0422  Weight: 84.4 kg    Examination:  General exam: Appears calm and comfortable  Respiratory system: Clear to auscultation. Respiratory effort normal.  Oxygenating 98% on 2L [place] which is her baseline Cardiovascular system: S1 & S2 heard, RRR. No JVD, murmurs, rubs, gallops or clicks. No pedal edema. Gastrointestinal system: Abdomen is nondistended, soft and nontender. No organomegaly or masses felt. Normal bowel sounds heard. Central nervous system: Alert and oriented. No focal neurological deficits. Extremities: Symmetric 5 x 5 power. Skin: ecchymosis noted left flank, No rashes, lesions or ulcers Psychiatry: Judgement and insight appear normal. Mood & affect appropriate.     Data Reviewed: I have personally reviewed following labs and imaging studies  CBC: Recent Labs  Lab 04/06/19 1105 04/07/19 0511 04/08/19 0340 04/09/19 0334 04/12/19 0340  WBC 5.9 6.9 5.4 6.7 5.6  NEUTROABS 4.0 5.0  --   --   --   HGB 11.2* 11.4* 9.4* 10.2* 10.0*  HCT 35.5* 36.6 30.7* 32.9* 32.3*  MCV 102.0* 104.3* 103.0* 103.8* 103.9*  PLT 108* 95* 78* 80* 333*   Basic Metabolic Panel: Recent Labs  Lab 04/06/19 1105 04/07/19 0511 04/08/19 0340 04/09/19 0334 04/12/19 0340  NA 141 143 144 142 143  K 4.7 4.4 4.8 4.5 4.2  CL 102 104 108 105 109  CO2 32 _1 GLUCOSE 139* 135* 127* 135* 129*  BUN 55* 57*  49* 33* 38*  CREATININE 2.01* 1.81* 1.66* 1.51* 1.38*  CALCIUM 9.7 9.4 9.2 9.3 8.9   GFR: Estimated Creatinine Clearance: 35.3 mL/min (A) (by C-G formula based on SCr of 1.38 mg/dL (H)). Liver Function Tests: Recent Labs  Lab 04/06/19 1105  AST 12*  ALT 8  ALKPHOS 44  BILITOT 0.4  PROT 6.7  ALBUMIN 4.5   No results for input(s): LIPASE, AMYLASE in the last 168 hours. No results for input(s): AMMONIA in the last 168 hours. Coagulation Profile: No results for input(s): INR, PROTIME in the last 168 hours. Cardiac Enzymes: No results for input(s): CKTOTAL, CKMB, CKMBINDEX, TROPONINI in the last 168 hours. BNP (last 3 results) No results for input(s): PROBNP in the last 8760 hours. HbA1C: No results for input(s): HGBA1C in the last 72 hours. CBG: No results for input(s): GLUCAP in the last 168 hours. Lipid Profile: No results for input(s): CHOL, HDL, LDLCALC, TRIG, CHOLHDL, LDLDIRECT in the last 72 hours. Thyroid Function Tests: No results for input(s): TSH, T4TOTAL, FREET4, T3FREE, THYROIDAB in the last 72 hours. Anemia Panel: No results for input(s): VITAMINB12, FOLATE, FERRITIN, TIBC, IRON, RETICCTPCT  in the last 72 hours. Sepsis Labs: No results for input(s): PROCALCITON, LATICACIDVEN in the last 168 hours.  Recent Results (from the past 240 hour(s))  SARS CORONAVIRUS 2 (TAT 6-24 HRS) Nasopharyngeal Nasopharyngeal Swab     Status: None   Collection Time: 04/07/19  6:46 AM   Specimen: Nasopharyngeal Swab  Result Value Ref Range Status   SARS Coronavirus 2 NEGATIVE NEGATIVE Final    Comment: (NOTE) SARS-CoV-2 target nucleic acids are NOT DETECTED. The SARS-CoV-2 RNA is generally detectable in upper and lower respiratory specimens during the acute phase of infection. Negative results do not preclude SARS-CoV-2 infection, do not rule out co-infections with other pathogens, and should not be used as the sole basis for treatment or other patient management  decisions. Negative results must be combined with clinical observations, patient history, and epidemiological information. The expected result is Negative. Fact Sheet for Patients: SugarRoll.be Fact Sheet for Healthcare Providers: https://www.woods-mathews.com/ This test is not yet approved or cleared by the [place] FDA and  has been authorized for detection and/or diagnosis of SARS-CoV-2 by FDA under an Emergency Use Authorization (EUA). This EUA will remain  in effect (meaning this test can be used) for the duration of the COVID-19 declaration under Section 56 4(b)(1) of the Act, 21 U.S.C. section 360bbb-3(b)(1), unless the authorization is terminated or revoked sooner. Performed at [facility], [place] [address] [place],  [address]          Radiology Studies: No results found.      Scheduled Meds: . acetaminophen  1,000 mg Oral Q6H  . allopurinol  200 mg Oral Daily  . docusate sodium  100 mg Oral BID  . enoxaparin (LOVENOX) injection  40 mg Subcutaneous Q24H  . guaiFENesin  600 mg Oral BID  . lidocaine  2 patch Transdermal Q24H  . magnesium citrate  1 Bottle Oral Once  . methocarbamol  750 mg Oral QID  . metoprolol succinate  75 mg Oral Daily  . mometasone-formoterol  2 puff Inhalation BID  . montelukast  10 mg Oral QHS  . pantoprazole  40 mg Oral BID  . polyethylene glycol  17 g Oral Daily  . pravastatin  40 mg Oral QPM  . sertraline  100 mg Oral Daily  . sodium chloride flush  3 mL Intravenous Q12H   Continuous Infusions:   LOS: 5 days    Time spent: 34 minutes spent on chart review, discussion with nursing staff, consultants, updating family and interview/physical exam; more than 50% of that time was spent in counseling and/or coordination of care.    Tabor Denham J [place] DO [facility] Available via Epic secure chat 7am-7pm After these hours, please refer to coverage provider listed on  [email or web address] 04/12/2019, 7:51 AM

## 2019-04-12 NOTE — Progress Notes (Signed)
Patient is stable from a trauma standpoint. No further intervention warranted.  Continue pulmonary toileting and IS.  She may follow up with her PCP prn for her rib fractures.  We will sign off at this time.  Appears patient is awaiting SNF placement.  Please call if needed.  Henreitta Cea 9:06 AM 04/12/2019

## 2019-04-12 NOTE — Plan of Care (Signed)
  Problem: Education: Goal: Knowledge of General Education information will improve Description Including pain rating scale, medication(s)/side effects and non-pharmacologic comfort measures Outcome: Progressing   

## 2019-04-13 LAB — SARS CORONAVIRUS 2 (TAT 6-24 HRS): SARS Coronavirus 2: NEGATIVE

## 2019-04-13 NOTE — NC FL2 (Signed)
[place] LEVEL OF CARE SCREENING TOOL     IDENTIFICATION  Patient Name: [name] Birthdate: [birth year] Sex: female Admission Date (Current Location): 04/07/2019  [facility] and [place] Number:  Herbalist and Address:  [facility] [place], [place] [address] [place], [place] [address]      Provider Number: [id number]  Attending Physician Name and Address:  Donne Hazel, MD  Relative Name and Phone Number:       Current Level of Care: Hospital Recommended Level of Care: [place] Prior Approval Number:    Date Approved/Denied:   PASRR Number: [id number]  Discharge Plan: SNF    Current Diagnoses: Patient Active Problem List   Diagnosis Date Noted  . Rib fracture 04/07/2019  . Chronic asthma, mild persistent, uncomplicated 83/33/8329  . High serum vitamin B12 11/20/2017  . Subarachnoid hemorrhage ([place]) 11/05/2017  . Intracranial hemorrhage ([place]) 11/04/2017  . Syncope and collapse 11/04/2017  . Thrombocytopenia ([place]) 10/16/2017  . 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 Vitamin D deficiency 06/16/2017  . Hypernatremia 12/25/2016  . Nonintractable headache 10/26/2016  . Leukocytes in urine 10/26/2016  . Peripheral arterial disease ([place]) 10/01/2016  . Renal insufficiency 09/22/2016  . Abnormal TSH 09/22/2016  . 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[place]) 08/04/2016  . Heart murmur 04/25/2016  . Chronic combined systolic and diastolic CHF (congestive heart failure) ([place]) 04/07/2016  . Bronchitis 03/06/2016  . Polymyalgia rheumatica ([place]) 01/07/2016  . Pedal edema 08/28/2015  . Chronic respiratory failure with hypoxia and hypercapnia ([place]) 06/28/2015  . Macular degeneration 04/10/2015  . Fall at home, initial encounter 12/04/2014  . Hypercalcemia 10/15/2014  . Hypoxia 10/15/2014  . 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 Cervical cancer screening 02/28/2014  . Eustachian  tube dysfunction 01/29/2014  . Medicare annual wellness visit, subsequent 12/04/2013  . Constipation 06/05/2013  . Shortness of breath 03/07/2013  . Tachycardia 02/23/2013  . Sun-damaged skin 10/24/2012  . Lower urinary tract infectious disease 10/24/2012  . Back pain 04/07/2011  . Allergic rhinitis 05/23/2009  . Gout 01/11/2008  . VARICOSE VEINS, LOWER EXTREMITIES 06/01/2007  . ADJ DISORDER WITH MIXED ANXIETY & DEPRESSED MOOD 03/25/2007  . Hyperlipidemia, mixed 12/29/2006  . Essential hypertension 12/29/2006  . Osteopenia 12/29/2006  . Abdominal pain 12/29/2006  . Diabetes type 2, controlled ([place]) 12/29/2006  . Asthma, moderate persistent 08/21/2006  . GERD 08/21/2006  . Osteoarthritis 08/21/2006  . Phlebitis and thrombophlebitis 08/21/2006    Orientation RESPIRATION BLADDER Height & Weight     Self, Time, Place, Situation  O2(2L nasal canula) Incontinent, External catheter Weight: 186 lb (84.4 kg) Height:  5\' 4"  (162.6 cm)  BEHAVIORAL SYMPTOMS/MOOD NEUROLOGICAL BOWEL NUTRITION STATUS      Continent Diet(see discharge summary)  AMBULATORY STATUS COMMUNICATION OF NEEDS Skin   Extensive Assist Verbally Bruising(generalized ecchymosis)                       Personal Care Assistance Level of Assistance  Bathing, Feeding, Dressing Bathing Assistance: Maximum assistance Feeding assistance: Independent Dressing Assistance: Maximum assistance     Functional Limitations Info  Sight, Hearing, Speech Sight Info: Adequate Hearing Info: Adequate Speech Info: Adequate    SPECIAL CARE FACTORS FREQUENCY  OT (By licensed OT), PT (By licensed PT)     PT Frequency: 5x week OT Frequency: 5x week            Contractures Contractures Info: Not  present    Additional Factors Info  Code Status, Allergies, Psychotropic Code Status Info: Full Code Allergies Info: Montelukast Sodium, Sulfa Antibiotics, Sulfonamide Derivatives, Oseltamivir Phosphate, Ace Inhibitors,  Indomethacin Psychotropic Info: sertraline (ZOLOFT) tablet 100 mg PO         Current Medications (04/13/2019):  This is the current hospital active medication list Current Facility-Administered Medications  Medication Dose Route Frequency Provider Last Rate Last Admin  . acetaminophen (TYLENOL) tablet 650 mg  650 mg Oral Q6H PRN Karmen Bongo, MD       Or  . acetaminophen (TYLENOL) suppository 650 mg  650 mg Rectal Q6H PRN Karmen Bongo, MD      . acetaminophen (TYLENOL) tablet 1,000 mg  1,000 mg Oral Q6H Karmen Bongo, MD   1,000 mg at 04/13/19 1240  . albuterol (PROVENTIL) (2.5 MG/3ML) 0.083% nebulizer solution 2.5 mg  2.5 mg Nebulization Q6H PRN Karmen Bongo, MD   2.5 mg at 04/10/19 0820  . allopurinol (ZYLOPRIM) tablet 200 mg  200 mg Oral Daily Karmen Bongo, MD   200 mg at 04/13/19 0904  . docusate sodium (COLACE) capsule 100 mg  100 mg Oral BID Karmen Bongo, MD   100 mg at 04/13/19 0905  . enoxaparin (LOVENOX) injection 40 mg  40 mg Subcutaneous Q24H [place] Eric J, DO   40 mg at 04/13/19 1019  . guaiFENesin (MUCINEX) 12 hr tablet 600 mg  600 mg Oral BID [place] Eric J, DO   600 mg at 04/13/19 4650  . HYDROmorphone (DILAUDID) injection 0.5 mg  0.5 mg Intravenous Q2H PRN Karmen Bongo, MD   0.5 mg at 04/08/19 2139  . lidocaine (LIDODERM) 5 % 2 patch  2 patch Transdermal Q24H Karmen Bongo, MD   2 patch at 04/13/19 1018  . methocarbamol (ROBAXIN) tablet 750 mg  750 mg Oral QID Karmen Bongo, MD   750 mg at 04/13/19 1440  . metoprolol succinate (TOPROL-XL) 24 hr tablet 75 mg  75 mg Oral Daily Karmen Bongo, MD   75 mg at 04/13/19 0904  . mometasone-formoterol (DULERA) 200-5 MCG/ACT inhaler 2 puff  2 puff Inhalation BID Karmen Bongo, MD   2 puff at 04/13/19 1004  . montelukast (SINGULAIR) tablet 10 mg  10 mg Oral Ivery Quale, MD   10 mg at 04/12/19 2155  . ondansetron (ZOFRAN) tablet 4 mg  4 mg Oral Q6H PRN Karmen Bongo, MD       Or  . ondansetron  [facility]) injection 4 mg  4 mg Intravenous Q6H PRN Karmen Bongo, MD      . oxyCODONE (Oxy IR/ROXICODONE) immediate release tablet 5-10 mg  5-10 mg Oral Q6H PRN Karmen Bongo, MD   10 mg at 04/13/19 1240  . pantoprazole (PROTONIX) EC tablet 40 mg  40 mg Oral BID Karmen Bongo, MD   40 mg at 04/13/19 0904  . polyethylene glycol (MIRALAX / GLYCOLAX) packet 17 g  17 g Oral Daily Saverio Danker, PA-C   17 g at 04/11/19 1054  . pravastatin (PRAVACHOL) tablet 40 mg  40 mg Oral QPM Karmen Bongo, MD   40 mg at 04/12/19 1809  . sertraline (ZOLOFT) tablet 100 mg  100 mg Oral Daily Karmen Bongo, MD   100 mg at 04/13/19 0904  . sodium chloride flush (NS) 0.9 % injection 3 mL  3 mL Intravenous Q12H Karmen Bongo, MD   3 mL at 04/13/19 1019  . traMADol (ULTRAM) tablet 50 mg  50 mg Oral TID PRN Karmen Bongo, MD  50 mg at 04/12/19 0848     Discharge Medications: Please see discharge summary for a list of discharge medications.  Relevant Imaging Results:  Relevant Lab Results:   Additional Information [id number] [place], [place]

## 2019-04-13 NOTE — Plan of Care (Signed)

## 2019-04-13 NOTE — Progress Notes (Signed)
Physical Therapy Treatment Patient Details Name: [name] MRN: [id number] DOB: [birth year] Today's Date: 04/13/2019    History of Present Illness Pt is a 79 y/o female admitted after fall in kitchen hitting a chair with left 7-10 rib fx. PMhx: HTN, HLD, gout, CHF, COPD, CKD, depression, macular degeneration    PT Comments    Pt making slow progress with functional mobility overall and remains limited secondary to fatigue, weakness and DOE. She was on 2L of O2 throughout with SPO2 maintaining >91% with activity. Pt would continue to benefit from skilled physical therapy services at this time while admitted and after d/c to address the below listed limitations in order to improve overall safety and independence with functional mobility.    Follow Up Recommendations  SNF;Supervision/Assistance - 24 hour     Equipment Recommendations  Other (comment)(defer to next venue of care)    Recommendations for Other Services       Precautions / Restrictions Precautions Precautions: Fall Precaution Comments: O2 dependent at baseline Restrictions Weight Bearing Restrictions: No    Mobility  Bed Mobility Overal bed mobility: Needs Assistance Bed Mobility: Supine to Sit     Supine to sit: Min assist     General bed mobility comments: min A to elevate trunk to achieve upright sitting at EOB towards pt's L side  Transfers Overall transfer level: Needs assistance Equipment used: Rolling walker (2 wheeled) Transfers: Sit to/from Stand Sit to Stand: Min assist         General transfer comment: increased time and effort, good hand placement utilized, min A for stability with transition into standing from EOB x1 and from [facility] x1  Ambulation/Gait Ambulation/Gait assistance: Min guard Gait Distance (Feet): 30 Feet Assistive device: Rolling walker (2 wheeled) Gait Pattern/deviations: Step-through pattern;Decreased stride length;Trunk flexed Gait velocity: decreased   General Gait  Details: pt with very slow but steady gait with use of RW; no instability or LOB; however, pt limited secondary to fatigue and increased SOB   Stairs             Wheelchair Mobility    Modified Rankin (Stroke Patients Only)       Balance Overall balance assessment: Needs assistance;History of Falls Sitting-balance support: Feet supported Sitting balance-Leahy Scale: Good     Standing balance support: Bilateral upper extremity supported;During functional activity Standing balance-Leahy Scale: Poor                              Cognition Arousal/Alertness: Awake/alert Behavior During Therapy: WFL for tasks assessed/performed Overall Cognitive Status: Within Functional Limits for tasks assessed                                        Exercises      General Comments        Pertinent Vitals/Pain Pain Assessment: Faces Faces Pain Scale: Hurts little more Pain Location: left chest/ribs  Pain Descriptors / Indicators: Discomfort;Guarding Pain Intervention(s): Monitored during session;Repositioned    Home Living                      Prior Function            PT Goals (current goals can now be found in the care plan section) Acute Rehab PT Goals PT Goal Formulation: With patient Time For Goal Achievement: 04/22/19  Potential to Achieve Goals: Fair Progress towards PT goals: Progressing toward goals    Frequency    Min 3X/week      PT Plan Current plan remains appropriate    Co-evaluation              AM-PAC PT "6 Clicks" Mobility   Outcome Measure  Help needed turning from your back to your side while in a flat bed without using bedrails?: A Little Help needed moving from lying on your back to sitting on the side of a flat bed without using bedrails?: A Little Help needed moving to and from a bed to a chair (including a wheelchair)?: A Little Help needed standing up from a chair using your arms (e.g.,  wheelchair or bedside chair)?: A Little Help needed to walk in hospital room?: A Little Help needed climbing 3-5 steps with a railing? : A Lot 6 Click Score: 17    End of Session Equipment Utilized During Treatment: Gait belt;Oxygen Activity Tolerance: Patient tolerated treatment well Patient left: in chair;with call bell/phone within reach;with chair alarm set Nurse Communication: Mobility status PT Visit Diagnosis: Other abnormalities of gait and mobility (R26.89);Difficulty in walking, not elsewhere classified (R26.2)     Time: 5003-7048 PT Time Calculation (min) (ACUTE ONLY): 23 min  Charges:  $Gait Training: 8-22 mins $Therapeutic Activity: 8-22 mins                     Anastasio Champion, DPT  Acute Rehabilitation Services Pager [phone] Office [place] 04/13/2019, 10:07 AM

## 2019-04-13 NOTE — Progress Notes (Signed)
PROGRESS NOTE    [name]  ZOX:[id number] DOB: [birth year] DOA: 04/07/2019 PCP: Mosie Lukes, MD    Brief Narrative:  [phone] who presented with mechanical fall, resulting in rib fractures. Trauma Surgery had been following, since cleared and now awaiting d/c to SNF  Assessment & Plan:   Principal Problem:   Rib fracture Active Problems:   Hyperlipidemia, mixed   Gout   Essential hypertension   Diabetes type 2, controlled ([place])   Fall at home, initial encounter   Chronic respiratory failure with hypoxia and hypercapnia (HCC)   Polymyalgia rheumatica (HCC)   Chronic combined systolic and diastolic CHF (congestive heart failure) (HCC)   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[place])  Acute Left 7-10 contiguous rib fractures Evidence of chronic rib fractures --Patient presenting to the ED with chest wall pain following mechanical fall resulting in left-sided rib fractures.  Rib fractures contiguous with concern of flail chest; which is not apparent at this time. --Trauma surgery following, since signed off as of 3/24 --Tylenol 1 g every 6 hours, lidocaine patch, Robaxin 750 mg PO QID --oxycodone 5-10mg  q6h prn --Dilaudid 0.5mg  IV q2h prn for severe breakthrough pain --Pulmonary toilet with incentive spirometry, flutter valve --Therapy recs for SNF, SW following  Chronic diastolic congestive heart failure; currently compensated --TTE 10/2017 with preserved EF and grade 1 diastolic dysfunction.  On furosemide 20mg  PO daily at home.  --Lasix remains on hold  Chronic hypoxic respiratory failure COPD on 2L [place] baseline --Continue Symbicort, Claritin, Singulair --albuterol nebs prn --Currently on RA  CKD stage IV Baseline GFR last year 23-29 with creatinine 1.7 - 2.0 over the past year. --Creatinine 1.38; improved from baseline --Continue to avoid nephrotoxins, renally dose all medications  DVT prophylaxis: Lovenox subq Code Status: Full Family Communication: Pt in room,  family not at bedside Disposition Plan: From home, d/c SNF pending PASSR and COVID screen  Consultants:   Trauma Surgery  Procedures:     Antimicrobials: Anti-infectives (From admission, onward)   None       Subjective: Eager to start therapy. Reports continued L side pain, asking for pillow on that side  Objective: Vitals:   04/13/19 0509 04/13/19 0751 04/13/19 1004 04/13/19 1353  BP: 129/61 (!) 122/53  (!) 119/55  Pulse: 68 74  70  Resp: 14 17  17   Temp: 98.3 F (36.8 C) 98 F (36.7 C)  98.2 F (36.8 C)  TempSrc: Oral Oral  Oral  SpO2: 98% 99% 98% 98%  Weight:      Height:        Intake/Output Summary (Last 24 hours) at 04/13/2019 1520 Last data filed at 04/13/2019 1237 Gross per 24 hour  Intake 240 ml  Output 301 ml  Net -61 ml   Filed Weights   04/07/19 0422  Weight: 84.4 kg    Examination:  General exam: Appears calm and comfortable  Respiratory system: Clear to auscultation. Respiratory effort normal. Cardiovascular system: S1 & S2 heard, Regular Gastrointestinal system: Abdomen is nondistended, soft and nontender. No organomegaly or masses felt. Normal bowel sounds heard. Central nervous system: Alert and oriented. No focal neurological deficits. Extremities: Symmetric 5 x 5 power. Skin: No rashes, lesions Psychiatry: Judgement and insight appear normal. Mood & affect appropriate.   Data Reviewed: I have personally reviewed following labs and imaging studies  CBC: Recent Labs  Lab 04/07/19 0511 04/08/19 0340 04/09/19 0334 04/12/19 0340  WBC 6.9 5.4 6.7 5.6  NEUTROABS 5.0  --   --   --  HGB 11.4* 9.4* 10.2* 10.0*  HCT 36.6 30.7* 32.9* 32.3*  MCV 104.3* 103.0* 103.8* 103.9*  PLT 95* 78* 80* 921*   Basic Metabolic Panel: Recent Labs  Lab 04/07/19 0511 04/08/19 0340 04/09/19 0334 04/12/19 0340  NA 143 144 142 143  K 4.4 4.8 4.5 4.2  CL 104 108 105 109  CO2 28 28 29 27   GLUCOSE 135* 127* 135* 129*  BUN 57* 49* 33* 38*   CREATININE 1.81* 1.66* 1.51* 1.38*  CALCIUM 9.4 9.2 9.3 8.9   GFR: Estimated Creatinine Clearance: 35.3 mL/min (A) (by C-G formula based on SCr of 1.38 mg/dL (H)). Liver Function Tests: No results for input(s): AST, ALT, ALKPHOS, BILITOT, PROT, ALBUMIN in the last 168 hours. No results for input(s): LIPASE, AMYLASE in the last 168 hours. No results for input(s): AMMONIA in the last 168 hours. Coagulation Profile: No results for input(s): INR, PROTIME in the last 168 hours. Cardiac Enzymes: No results for input(s): CKTOTAL, CKMB, CKMBINDEX, TROPONINI in the last 168 hours. BNP (last 3 results) No results for input(s): PROBNP in the last 8760 hours. HbA1C: No results for input(s): HGBA1C in the last 72 hours. CBG: Recent Labs  Lab 04/12/19 2321  GLUCAP 96   Lipid Profile: No results for input(s): CHOL, HDL, LDLCALC, TRIG, CHOLHDL, LDLDIRECT in the last 72 hours. Thyroid Function Tests: No results for input(s): TSH, T4TOTAL, FREET4, T3FREE, THYROIDAB in the last 72 hours. Anemia Panel: No results for input(s): VITAMINB12, FOLATE, FERRITIN, TIBC, IRON, RETICCTPCT in the last 72 hours. Sepsis Labs: No results for input(s): PROCALCITON, LATICACIDVEN in the last 168 hours.  Recent Results (from the past 240 hour(s))  SARS CORONAVIRUS 2 (TAT 6-24 HRS) Nasopharyngeal Nasopharyngeal Swab     Status: None   Collection Time: 04/07/19  6:46 AM   Specimen: Nasopharyngeal Swab  Result Value Ref Range Status   SARS Coronavirus 2 NEGATIVE NEGATIVE Final    Comment: (NOTE) SARS-CoV-2 target nucleic acids are NOT DETECTED. The SARS-CoV-2 RNA is generally detectable in upper and lower respiratory specimens during the acute phase of infection. Negative results do not preclude SARS-CoV-2 infection, do not rule out co-infections with other pathogens, and should not be used as the sole basis for treatment or other patient management decisions. Negative results must be combined with clinical  observations, patient history, and epidemiological information. The expected result is Negative. Fact Sheet for Patients: SugarRoll.be Fact Sheet for Healthcare Providers: https://www.woods-mathews.com/ This test is not yet approved or cleared by the [place] FDA and  has been authorized for detection and/or diagnosis of SARS-CoV-2 by FDA under an Emergency Use Authorization (EUA). This EUA will remain  in effect (meaning this test can be used) for the duration of the COVID-19 declaration under Section 56 4(b)(1) of the Act, 21 U.S.C. section 360bbb-3(b)(1), unless the authorization is terminated or revoked sooner. Performed at [facility], [place] [address] [place], [place] [address]      Radiology Studies: No results found.  Scheduled Meds: . acetaminophen  1,000 mg Oral Q6H  . allopurinol  200 mg Oral Daily  . docusate sodium  100 mg Oral BID  . enoxaparin (LOVENOX) injection  40 mg Subcutaneous Q24H  . guaiFENesin  600 mg Oral BID  . lidocaine  2 patch Transdermal Q24H  . methocarbamol  750 mg Oral QID  . metoprolol succinate  75 mg Oral Daily  . mometasone-formoterol  2 puff Inhalation BID  . montelukast  10 mg Oral QHS  .  pantoprazole  40 mg Oral BID  . polyethylene glycol  17 g Oral Daily  . pravastatin  40 mg Oral QPM  . sertraline  100 mg Oral Daily  . sodium chloride flush  3 mL Intravenous Q12H   Continuous Infusions:   LOS: 6 days   Marylu Lund, MD [facility] Pager On Amion  If 7PM-7AM, please contact night-coverage 04/13/2019, 3:20 PM

## 2019-04-13 NOTE — TOC Progression Note (Addendum)
Transition of Care [facility]) - Progression Note    Patient Details  Name: [name] MRN: [id number] Date of Birth: [birth year]  Transition of Care [facility]) CM/SW Contact  Sharin Mons, RN Phone Number: [phone] 04/13/2019, 3:36 PM  Clinical Narrative:    Pt will transition to SNF/ [facility] , awaiting insurance authorization. TOC team will continue to monitor.    04/13/2019 @1630   Call received from [facility]. Juliann Pulse informed NCM pt insurance approval for SNF placement received. COVID pending....  Expected Discharge Plan: Skilled Nursing Facility Barriers to Discharge: Insurance Authorization  Expected Discharge Plan and Services Expected Discharge Plan: [place]   Discharge Planning Services: CM Consult Post Acute Care Choice: [place] Living arrangements for the past 2 months: Single Family Home    Social Determinants of Health (SDOH) Interventions    Readmission Risk Interventions Readmission Risk Prevention Plan 04/09/2019  Transportation Screening Complete  PCP or Specialist Appt within 3-5 Days Not Complete  Not Complete comments plan for SNF  HRI or [place] Not Complete  HRI or Home Care Consult comments plan for SNF  Social Work Consult for [place] Planning/Counseling Complete  Palliative Care Screening Not Applicable  Medication Review (RN Care Manager) Referral to Pharmacy  Some recent data might be hidden

## 2019-04-14 DIAGNOSIS — S2242XD Multiple fractures of ribs, left side, subsequent encounter for fracture with routine healing: Secondary | ICD-10-CM | POA: Diagnosis not present

## 2019-04-14 DIAGNOSIS — F339 Major depressive disorder, recurrent, unspecified: Secondary | ICD-10-CM | POA: Diagnosis not present

## 2019-04-14 DIAGNOSIS — Z7401 Bed confinement status: Secondary | ICD-10-CM | POA: Diagnosis not present

## 2019-04-14 DIAGNOSIS — J9611 Chronic respiratory failure with hypoxia: Secondary | ICD-10-CM | POA: Diagnosis not present

## 2019-04-14 DIAGNOSIS — I5042 Chronic combined systolic (congestive) and diastolic (congestive) heart failure: Secondary | ICD-10-CM | POA: Diagnosis not present

## 2019-04-14 DIAGNOSIS — N184 Chronic kidney disease, stage 4 (severe): Secondary | ICD-10-CM | POA: Diagnosis not present

## 2019-04-14 DIAGNOSIS — M6281 Muscle weakness (generalized): Secondary | ICD-10-CM | POA: Diagnosis not present

## 2019-04-14 DIAGNOSIS — R262 Difficulty in walking, not elsewhere classified: Secondary | ICD-10-CM | POA: Diagnosis not present

## 2019-04-14 DIAGNOSIS — M5489 Other dorsalgia: Secondary | ICD-10-CM | POA: Diagnosis not present

## 2019-04-14 DIAGNOSIS — T07XXXA Unspecified multiple injuries, initial encounter: Secondary | ICD-10-CM | POA: Diagnosis not present

## 2019-04-14 DIAGNOSIS — M2550 Pain in unspecified joint: Secondary | ICD-10-CM | POA: Diagnosis not present

## 2019-04-14 DIAGNOSIS — R05 Cough: Secondary | ICD-10-CM | POA: Diagnosis not present

## 2019-04-14 DIAGNOSIS — J4520 Mild intermittent asthma, uncomplicated: Secondary | ICD-10-CM | POA: Diagnosis not present

## 2019-04-14 DIAGNOSIS — R0989 Other specified symptoms and signs involving the circulatory and respiratory systems: Secondary | ICD-10-CM | POA: Diagnosis not present

## 2019-04-14 DIAGNOSIS — J449 Chronic obstructive pulmonary disease, unspecified: Secondary | ICD-10-CM | POA: Diagnosis not present

## 2019-04-14 DIAGNOSIS — I10 Essential (primary) hypertension: Secondary | ICD-10-CM | POA: Diagnosis not present

## 2019-04-14 DIAGNOSIS — R52 Pain, unspecified: Secondary | ICD-10-CM | POA: Diagnosis not present

## 2019-04-14 DIAGNOSIS — K219 Gastro-esophageal reflux disease without esophagitis: Secondary | ICD-10-CM | POA: Diagnosis not present

## 2019-04-14 DIAGNOSIS — E118 Type 2 diabetes mellitus with unspecified complications: Secondary | ICD-10-CM | POA: Diagnosis not present

## 2019-04-14 DIAGNOSIS — R071 Chest pain on breathing: Secondary | ICD-10-CM | POA: Diagnosis not present

## 2019-04-14 DIAGNOSIS — R0902 Hypoxemia: Secondary | ICD-10-CM | POA: Diagnosis not present

## 2019-04-14 DIAGNOSIS — G8929 Other chronic pain: Secondary | ICD-10-CM | POA: Diagnosis not present

## 2019-04-14 DIAGNOSIS — E119 Type 2 diabetes mellitus without complications: Secondary | ICD-10-CM | POA: Diagnosis not present

## 2019-04-14 DIAGNOSIS — S2242XS Multiple fractures of ribs, left side, sequela: Secondary | ICD-10-CM | POA: Diagnosis not present

## 2019-04-14 DIAGNOSIS — E782 Mixed hyperlipidemia: Secondary | ICD-10-CM | POA: Diagnosis not present

## 2019-04-14 DIAGNOSIS — M353 Polymyalgia rheumatica: Secondary | ICD-10-CM | POA: Diagnosis not present

## 2019-04-14 DIAGNOSIS — I517 Cardiomegaly: Secondary | ICD-10-CM | POA: Diagnosis not present

## 2019-04-14 DIAGNOSIS — R5381 Other malaise: Secondary | ICD-10-CM | POA: Diagnosis not present

## 2019-04-14 MED ORDER — LIDOCAINE 5 % EX PTCH: 2.0000 | MEDICATED_PATCH | CUTANEOUS | 0 refills | Status: DC

## 2019-04-14 MED ORDER — METHOCARBAMOL 750 MG PO TABS: 750.0000 mg | ORAL_TABLET | Freq: Four times a day (QID) | ORAL | 0 refills | Status: DC

## 2019-04-14 MED ORDER — TRAMADOL HCL 50 MG PO TABS: 50.0000 mg | ORAL_TABLET | Freq: Three times a day (TID) | ORAL | 0 refills | Status: DC | PRN

## 2019-04-14 MED ORDER — POLYETHYLENE GLYCOL 3350 17 G PO PACK: 17.0000 g | PACK | Freq: Every day | ORAL | 0 refills | Status: AC

## 2019-04-14 MED ORDER — DOCUSATE SODIUM 100 MG PO CAPS: 100.0000 mg | ORAL_CAPSULE | Freq: Two times a day (BID) | ORAL | 0 refills | Status: DC

## 2019-04-14 NOTE — Progress Notes (Signed)
A discharge packet has been printed and will be sent via [facility] to [facility] with the patient.  Report given to Isaias Sakai, nurse.  The patient and her family are aware of discharge and transfer.

## 2019-04-14 NOTE — Progress Notes (Signed)
Physical Therapy Treatment Patient Details Name: [name] MRN: [id number] DOB: 1940-09-20 Today's Date: 04/14/2019    History of Present Illness Pt is a 79 y/o female admitted after fall in kitchen hitting a chair with left 7-10 rib fx. PMhx: HTN, HLD, gout, CHF, COPD, CKD, depression, macular degeneration    PT Comments    Pt pleasant and reports having been getting up to toilet for all voiding. Pt with improving ability with transfers and activity but remains limited by fatigue only able to ambulate limited distance in room and still unable to care for herself. Pt with SpO2 95% on 2L with slight wheezing noted in standing at sink when brushing teeth. Encouraged continued mobility and HEP.     Follow Up Recommendations  SNF;Supervision/Assistance - 24 hour     Equipment Recommendations       Recommendations for Other Services       Precautions / Restrictions Precautions Precautions: Fall Precaution Comments: O2 dependent at baseline    Mobility  Bed Mobility Overal bed mobility: Needs Assistance Bed Mobility: Rolling;Sidelying to Sit Rolling: Min assist Sidelying to sit: Min guard;HOB elevated       General bed mobility comments: rolling right with cues and assist, minguard with increased time to rise into sitting  Transfers Overall transfer level: Needs assistance   Transfers: Sit to/from Stand Sit to Stand: Min guard         General transfer comment: guarding to rise from bed and BSC with increased time and cues  Ambulation/Gait Ambulation/Gait assistance: Min guard Gait Distance (Feet): 22 Feet Assistive device: Rolling walker (2 wheeled) Gait Pattern/deviations: Step-through pattern;Decreased stride length;Trunk flexed   Gait velocity interpretation: 1.31 - 2.62 ft/sec, indicative of limited community ambulator General Gait Details: slow steady gait. Pt walked 22' then 77' and denied further distance. Limited by fatigue   Stairs              Wheelchair Mobility    Modified Rankin (Stroke Patients Only)       Balance Overall balance assessment: Needs assistance;History of Falls   Sitting balance-Leahy Scale: Good     Standing balance support: Bilateral upper extremity supported;During functional activity Standing balance-Leahy Scale: Poor Standing balance comment: reliant on external support from walker                            Cognition Arousal/Alertness: Awake/alert Behavior During Therapy: WFL for tasks assessed/performed Overall Cognitive Status: Within Functional Limits for tasks assessed                                        Exercises General Exercises - Lower Extremity Long Arc Quad: AROM;Both;Seated;15 reps Hip ABduction/ADduction: AROM;Both;Seated;10 reps Hip Flexion/Marching: AROM;Both;Seated;15 reps    General Comments        Pertinent Vitals/Pain Pain Score: 4  Pain Location: left chest/ribs  Pain Descriptors / Indicators: Discomfort;Guarding;Aching Pain Intervention(s): Limited activity within patient's tolerance;Monitored during session;Premedicated before session;Repositioned    Home Living                      Prior Function            PT Goals (current goals can now be found in the care plan section) Progress towards PT goals: Progressing toward goals    Frequency    Min 3X/week  PT Plan Current plan remains appropriate    Co-evaluation              AM-PAC PT "6 Clicks" Mobility   Outcome Measure  Help needed turning from your back to your side while in a flat bed without using bedrails?: A Little Help needed moving from lying on your back to sitting on the side of a flat bed without using bedrails?: A Little Help needed moving to and from a bed to a chair (including a wheelchair)?: A Little Help needed standing up from a chair using your arms (e.g., wheelchair or bedside chair)?: A Little Help needed to walk in  hospital room?: A Little Help needed climbing 3-5 steps with a railing? : A Lot 6 Click Score: 17    End of Session Equipment Utilized During Treatment: Gait belt;Oxygen Activity Tolerance: Patient tolerated treatment well Patient left: in chair;with call bell/phone within reach;with chair alarm set;with family/visitor present Nurse Communication: Mobility status PT Visit Diagnosis: Other abnormalities of gait and mobility (R26.89);Difficulty in walking, not elsewhere classified (R26.2)     Time: 4847-2072 PT Time Calculation (min) (ACUTE ONLY): 26 min  Charges:  $Gait Training: 8-22 mins $Therapeutic Exercise: 8-22 mins                     Daved Mcfann P, PT Acute Rehabilitation Services Pager: [phone] Office: [place] 04/14/2019, 9:37 AM

## 2019-04-14 NOTE — Discharge Summary (Signed)
Physician Discharge Summary  [name] BPZ:[id number] DOB: 04-17-1940 DOA: 04/07/2019  PCP: Mosie Lukes, MD  Admit date: 04/07/2019 Discharge date: 04/14/2019  Admitted From: Home Disposition:  SNF  Recommendations for Outpatient Follow-up:  1. Follow up with PCP in 1-2 weeks 2. Follow up with Orthopedic Surgery as scheduled 3. Would resume lasix on 3/29 4. Recommend repeat BMET in 1 week, focus on renal function  Discharge Condition:Stable CODE STATUS:Full Diet recommendation: Diabetic   Brief/Interim Summary: 79 year old Caucasian female with past medical history remarkable for PMR, HTN, HLD, gout, hypercalcemia, history of DVT, chronic diastolic congestive heart failure, COPD on 2 L [place] baseline, CKD stage III who presented with mechanical fall and rib pain.  States was getting up to go to the restroom and went to get water in the kitchen and slipped and fell on the kitchen floor hitting a chair on the way down.  Patient sustained pain to her left ribs and back.  Presenting with severe pain with movement.  Denies syncopal episode.  In ED was noted to have significant rib fractures on the left, trauma surgery consulted.  Medicine consulted for admission for pain control and mobility assessment.  Discharge Diagnoses:  Principal Problem:   Rib fracture Active Problems:   Hyperlipidemia, mixed   Gout   Essential hypertension   Diabetes type 2, controlled ([place])   Fall at home, initial encounter   Chronic respiratory failure with hypoxia and hypercapnia (HCC)   Polymyalgia rheumatica (HCC)   Chronic combined systolic and diastolic CHF (congestive heart failure) (HCC)   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HCC)   Acute Left 7-10 contiguous rib fractures Evidence of chronic rib fractures --Patient presenting to the ED with chest wall pain following mechanical fall resulting in left-sided rib fractures. Rib fractures contiguous with initial concerns of flail chest; which is not  apparent at this time. --Trauma surgery had been following, since signed off as of 3/24 --Tylenol 1 g every 6 hours, lidocaine patch, Robaxin 750 mg PO QID --Pt was given Dilaudid 0.73m IV q2h prn for severe breakthrough pain while in hospital --Continued on PRN ultram per home regimen --Was continued with pulmonary toilet with incentive spirometry, flutter valve --Therapy recs for SNF  Chronic diastolic congestive heart failure; currently compensated --TTE 10/2017 with preserved EF and grade 1 diastolic dysfunction. On furosemide 236mPO daily at home.  --Lasix had remained on hold. Would resume as tolerated on 3/29 as tolerated --Would check renal panel within one week  Chronic hypoxic respiratory failure COPD on 2L [place] baseline --Continue Symbicort, Claritin, Singulair --albuterol nebs prn --Currently on minimal O2 support  CKD stage IV Baseline GFR last year 23-29 with creatinine 1.7 - 2.0 over the past year. --Most recent Creatinine 1.38; improved from baseline --Would continue to avoid nephrotoxins, renally dose all medications --Recommend repeat bmet in one week   Discharge Instructions   Allergies as of 04/14/2019      Reactions   Montelukast Sodium Other (See Comments)   REACTION: HEART PALPITATIONS, CHEST PAIN.   Sulfa Antibiotics Other (See Comments)   CHEST PAIN   Sulfonamide Derivatives Other (See Comments)   CHEST PAIN   Oseltamivir Phosphate Diarrhea, Nausea And Vomiting, Other (See Comments)   TAMIFLU REACTION:  dizziness   Ace Inhibitors Other (See Comments)   PT. STATED UNKNOWN REACTION   Indomethacin Other (See Comments)   PT. STATED UNKNOWN REACTION      Medication List    STOP taking these medications  furosemide 40 MG tablet Commonly known as: LASIX   potassium chloride SA 20 MEQ tablet Commonly known as: KLOR-CON     TAKE these medications   Accu-Chek Aviva Plus test strip Generic drug: glucose blood   Accu-Chek Aviva Soln    Accu-Chek Softclix Lancets lancets   acetaminophen 500 MG tablet Commonly known as: TYLENOL Take 500 mg by mouth 2 (two) times daily.   albuterol 0.63 MG/3ML nebulizer solution Commonly known as: ACCUNEB Take 3 mLs (0.63 mg total) by nebulization every 6 (six) hours as needed for wheezing or shortness of breath.   albuterol 108 (90 Base) MCG/ACT inhaler Commonly known as: VENTOLIN HFA Inhale 2 puffs into the lungs 4 (four) times daily as needed for wheezing or shortness of breath.   Alcohol Wipes 70 % Pads To use before checking blood sugars   allopurinol 100 MG tablet Commonly known as: ZYLOPRIM TAKE 2 TABLETS ONE TIME DAILY What changed: See the new instructions.   b complex vitamins tablet Take 1 tablet by mouth daily.   blood glucose meter kit and supplies Check blood sugars once weekly.   budesonide-formoterol 160-4.5 MCG/ACT inhaler Commonly known as: Symbicort Inhale 2 puffs into the lungs 2 (two) times daily.   cholecalciferol 1000 units tablet Commonly known as: VITAMIN D Take 1,000 Units daily by mouth.   docusate sodium 100 MG capsule Commonly known as: COLACE Take 1 capsule (100 mg total) by mouth 2 (two) times daily.   Ferrous Fumarate-Folic Acid 416-3 MG Tabs Commonly known as: Hemocyte-F Take 1 tablet by mouth daily.   fluticasone 50 MCG/ACT nasal spray Commonly known as: FLONASE Place 1 spray into both nostrils daily. What changed:   when to take this  reasons to take this   lidocaine 5 % Commonly known as: LIDODERM Place 2 patches onto the skin daily. Remove & Discard patch within 12 hours or as directed by MD Start taking on: April 15, 2019   loratadine 10 MG tablet Commonly known as: CLARITIN Take 1 tablet (10 mg total) by mouth at bedtime. What changed:   when to take this  reasons to take this   methocarbamol 750 MG tablet Commonly known as: ROBAXIN Take 1 tablet (750 mg total) by mouth 4 (four) times daily.   metoprolol  succinate 50 MG 24 hr tablet Commonly known as: TOPROL-XL TAKE 1 AND 1/2 TABLETS EVERY DAY What changed: See the new instructions.   montelukast 10 MG tablet Commonly known as: SINGULAIR TAKE 1 TABLET AT BEDTIME   OXYGEN Inhale 2 L into the lungs continuous.   pantoprazole 40 MG tablet Commonly known as: PROTONIX Take 40 mg by mouth 2 (two) times daily.   polyethylene glycol 17 g packet Commonly known as: MIRALAX / GLYCOLAX Take 17 g by mouth daily. Start taking on: April 15, 2019   pravastatin 40 MG tablet Commonly known as: PRAVACHOL Take 1 tablet (40 mg total) by mouth every evening.   PROBIOTIC PO Take 1 tablet by mouth daily.   sertraline 100 MG tablet Commonly known as: ZOLOFT TAKE 1 TABLET EVERY DAY   traMADol 50 MG tablet Commonly known as: ULTRAM Take 1 tablet (50 mg total) by mouth 3 (three) times daily as needed for moderate pain. What changed: See the new instructions.   trolamine salicylate 10 % cream Commonly known as: ASPERCREME Apply 1 application topically as needed for muscle pain.   vitamin B-12 1000 MCG tablet Commonly known as: CYANOCOBALAMIN Take 1,000 mcg by mouth daily.  Durable Medical Equipment  (From admission, onward)         Start     Ordered   04/08/19 1502  For home use only DME 3 n 1  Once     04/08/19 1502         Contact information for after-discharge care    Destination    [facility] Preferred SNF .   Service: Skilled Nursing Contact information: [address] [address] [phone]             Allergies  Allergen Reactions  . Montelukast Sodium Other (See Comments)    REACTION: HEART PALPITATIONS, CHEST PAIN.  Marland Kitchen Sulfa Antibiotics Other (See Comments)    CHEST PAIN  . Sulfonamide Derivatives Other (See Comments)    CHEST PAIN  . Oseltamivir Phosphate Diarrhea, Nausea And Vomiting and Other (See Comments)    TAMIFLU REACTION:  dizziness  . Ace  Inhibitors Other (See Comments)    PT. STATED UNKNOWN REACTION  . Indomethacin Other (See Comments)    PT. STATED UNKNOWN REACTION    Consultations:  Trauma surgery  Procedures/Studies: CT Chest Wo Contrast  Result Date: 04/07/2019 CLINICAL DATA:  Tripped and fell, struck left-sided of chest on a wooden chair, pain worse with inspiration, history of pneumonia, hypoxic, hypertension, GERD and COPD EXAM: CT CHEST WITHOUT CONTRAST TECHNIQUE: Multidetector CT imaging of the chest was performed following the standard protocol without IV contrast. COMPARISON:  CT chest 01/12/2014 FINDINGS: Cardiovascular: Mild cardiomegaly with predominantly right heart enlargement. Coronary artery calcifications are present. Some dense calcifications noted at the level of mitral annulus as well. No pericardial effusion. Central pulmonary arteries are borderline enlarged. Luminal evaluation precluded in the absence of contrast. Atherosclerotic plaque within the normal caliber aorta. Shared origin of the brachiocephalic and left common carotid artery. Minimal plaque in the proximal great vessels. Mediastinum/Nodes: Scattered low-attenuation prominent though nonenlarged mediastinal nodes are similar to comparison 01/12/2014. No axillary adenopathy. Hilar nodes difficult to assess in the absence of contrast media. No acute abnormality of the trachea or esophagus. Thyroid gland and thoracic inlet are unremarkable. Lungs/Pleura: There are diffuse subpleural reticular changes, most notably within the lung apices. Some of these some interlobular septal thickening is present as well with peribronchovascular cuffing. No focal consolidative opacity is seen. No left pneumothorax. Trace left pleural thickening, may reflect small amount of pleural fluid. Upper Abdomen: Few calcified gallstones present within the otherwise normal gallbladder. No acute abnormalities present in the visualized portions of the upper abdomen. No evidence of  direct splenic injury or perisplenic hemorrhage Musculoskeletal: Minimally displaced acute lateral left seventh through tenth rib fractures with additional segmental posterior fractures of the eighth through tenth ribs. Several additional remote left-sided rib fractures are noted as well. IMPRESSION: 1. Acute minimally displaced left seventh through tenth rib fractures which are segmental from the eighth through tenth ribs with fracture seen both laterally and posteriorly. Additional chronic left-sided rib fractures noted as well. Given contiguous segmental fractures, correlate for flail chest morphology. 2. Trace left pleural thickening may reflect small amount of pleural fluid. No pneumothorax. 3. Apical reticular features suggest some component of chronic interstitial disease likely with superimposed interstitial edematous change. 4. 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 5. Coronary artery calcifications. 6. Cholelithiasis. 7. Aortic Atherosclerosis (ICD10-I70.0). Electronically Signed   By: Lovena Le M.D.   On: 04/07/2019 05:50    Subjective: Eager to start therapy  Discharge Exam: Vitals:   04/14/19  8563 04/14/19 0833  BP: (!) 125/55 (!) 125/55  Pulse: 81 81  Resp: 17   Temp: 98 F (36.7 C)   SpO2: 100%    Vitals:   04/14/19 0500 04/14/19 0708 04/14/19 0819 04/14/19 0833  BP: 138/72  (!) 125/55 (!) 125/55  Pulse: 78  81 81  Resp: 15  17   Temp: 97.8 F (36.6 C)  98 F (36.7 C)   TempSrc: Oral  Oral   SpO2: 100% 98% 100%   Weight:      Height:        General: Pt is alert, awake, not in acute distress Cardiovascular: RRR, S1/S2 +, no rubs, no gallops Respiratory: CTA bilaterally, no wheezing, no rhonchi Abdominal: Soft, NT, ND, bowel sounds + Extremities: no edema, no cyanosis   The results of significant diagnostics from this hospitalization (including imaging, microbiology, ancillary and laboratory) are listed below for  reference.     Microbiology: Recent Results (from the past 240 hour(s))  SARS CORONAVIRUS 2 (TAT 6-24 HRS) Nasopharyngeal Nasopharyngeal Swab     Status: None   Collection Time: 04/07/19  6:46 AM   Specimen: Nasopharyngeal Swab  Result Value Ref Range Status   SARS Coronavirus 2 NEGATIVE NEGATIVE Final    Comment: (NOTE) SARS-CoV-2 target nucleic acids are NOT DETECTED. The SARS-CoV-2 RNA is generally detectable in upper and lower respiratory specimens during the acute phase of infection. Negative results do not preclude SARS-CoV-2 infection, do not rule out co-infections with other pathogens, and should not be used as the sole basis for treatment or other patient management decisions. Negative results must be combined with clinical observations, patient history, and epidemiological information. The expected result is Negative. Fact Sheet for Patients: SugarRoll.be Fact Sheet for Healthcare Providers: https://www.woods-mathews.com/ This test is not yet approved or cleared by the [place] FDA and  has been authorized for detection and/or diagnosis of SARS-CoV-2 by FDA under an Emergency Use Authorization (EUA). This EUA will remain  in effect (meaning this test can be used) for the duration of the COVID-19 declaration under Section 56 4(b)(1) of the Act, 21 U.S.C. section 360bbb-3(b)(1), unless the authorization is terminated or revoked sooner. Performed at [facility], [place] [address], [place], [place] [address]   SARS CORONAVIRUS 2 (TAT 6-24 HRS) Nasopharyngeal Nasopharyngeal Swab     Status: None   Collection Time: 04/13/19 11:42 AM   Specimen: Nasopharyngeal Swab  Result Value Ref Range Status   SARS Coronavirus 2 NEGATIVE NEGATIVE Final    Comment: (NOTE) SARS-CoV-2 target nucleic acids are NOT DETECTED. The SARS-CoV-2 RNA is generally detectable in upper and lower respiratory specimens during the acute phase of  infection. Negative results do not preclude SARS-CoV-2 infection, do not rule out co-infections with other pathogens, and should not be used as the sole basis for treatment or other patient management decisions. Negative results must be combined with clinical observations, patient history, and epidemiological information. The expected result is Negative. Fact Sheet for Patients: SugarRoll.be Fact Sheet for Healthcare Providers: https://www.woods-mathews.com/ This test is not yet approved or cleared by the [place] FDA and  has been authorized for detection and/or diagnosis of SARS-CoV-2 by FDA under an Emergency Use Authorization (EUA). This EUA will remain  in effect (meaning this test can be used) for the duration of the COVID-19 declaration under Section 56 4(b)(1) of the Act, 21 U.S.C. section 360bbb-3(b)(1), unless the authorization is terminated or revoked sooner. Performed at [facility], [place]  [address] [place], [place] [address]      Labs: BNP (last 3 results) No results for input(s): BNP in the last 8760 hours. Basic Metabolic Panel: Recent Labs  Lab 04/08/19 0340 04/09/19 0334 04/12/19 0340  NA 144 142 143  K 4.8 4.5 4.2  CL 108 105 109  CO2 _0 GLUCOSE 127* 135* 129*  BUN 49* 33* 38*  CREATININE 1.66* 1.51* 1.38*  CALCIUM 9.2 9.3 8.9   Liver Function Tests: No results for input(s): AST, ALT, ALKPHOS, BILITOT, PROT, ALBUMIN in the last 168 hours. No results for input(s): LIPASE, AMYLASE in the last 168 hours. No results for input(s): AMMONIA in the last 168 hours. CBC: Recent Labs  Lab 04/08/19 0340 04/09/19 0334 04/12/19 0340  WBC 5.4 6.7 5.6  HGB 9.4* 10.2* 10.0*  HCT 30.7* 32.9* 32.3*  MCV 103.0* 103.8* 103.9*  PLT 78* 80* 100*   Cardiac Enzymes: No results for input(s): CKTOTAL, CKMB, CKMBINDEX, TROPONINI in the last 168 hours. BNP: Invalid input(s): POCBNP CBG: Recent Labs  Lab  04/12/19 2321  GLUCAP 96   D-Dimer No results for input(s): DDIMER in the last 72 hours. Hgb A1c No results for input(s): HGBA1C in the last 72 hours. Lipid Profile No results for input(s): CHOL, HDL, LDLCALC, TRIG, CHOLHDL, LDLDIRECT in the last 72 hours. Thyroid function studies No results for input(s): TSH, T4TOTAL, T3FREE, THYROIDAB in the last 72 hours.  Invalid input(s): FREET3 Anemia work up No results for input(s): VITAMINB12, FOLATE, FERRITIN, TIBC, IRON, RETICCTPCT in the last 72 hours. Urinalysis    Component Value Date/Time   COLORURINE YELLOW 09/17/2017 [place] 09/17/2017 1357   LABSPEC 1.010 09/17/2017 1357   PHURINE 5.5 09/17/2017 1357   GLUCOSEU NEGATIVE 09/17/2017 1357   HGBUR NEGATIVE 09/17/2017 1357   [place] 09/17/2017 1357   BILIRUBINUR neg 08/23/2013 1029   KETONESUR NEGATIVE 09/17/2017 1357   PROTEINUR neg 08/23/2013 1029   PROTEINUR NEG 10/20/2012 1148   UROBILINOGEN 0.2 09/17/2017 1357   NITRITE NEGATIVE 09/17/2017 1357   LEUKOCYTESUR NEGATIVE 09/17/2017 1357   Sepsis Labs Invalid input(s): PROCALCITONIN,  WBC,  LACTICIDVEN Microbiology Recent Results (from the past 240 hour(s))  SARS CORONAVIRUS 2 (TAT 6-24 HRS) Nasopharyngeal Nasopharyngeal Swab     Status: None   Collection Time: 04/07/19  6:46 AM   Specimen: Nasopharyngeal Swab  Result Value Ref Range Status   SARS Coronavirus 2 NEGATIVE NEGATIVE Final    Comment: (NOTE) SARS-CoV-2 target nucleic acids are NOT DETECTED. The SARS-CoV-2 RNA is generally detectable in upper and lower respiratory specimens during the acute phase of infection. Negative results do not preclude SARS-CoV-2 infection, do not rule out co-infections with other pathogens, and should not be used as the sole basis for treatment or other patient management decisions. Negative results must be combined with clinical observations, patient history, and epidemiological information. The  expected result is Negative. Fact Sheet for Patients: SugarRoll.be Fact Sheet for Healthcare Providers: https://www.woods-mathews.com/ This test is not yet approved or cleared by the [place] FDA and  has been authorized for detection and/or diagnosis of SARS-CoV-2 by FDA under an Emergency Use Authorization (EUA). This EUA will remain  in effect (meaning this test can be used) for the duration of the COVID-19 declaration under Section 56 4(b)(1) of the Act, 21 U.S.C. section 360bbb-3(b)(1), unless the authorization is terminated or revoked sooner. Performed at [facility], [place] [address] [place], [place] [address]   SARS CORONAVIRUS 2 (  TAT 6-24 HRS) Nasopharyngeal Nasopharyngeal Swab     Status: None   Collection Time: 04/13/19 11:42 AM   Specimen: Nasopharyngeal Swab  Result Value Ref Range Status   SARS Coronavirus 2 NEGATIVE NEGATIVE Final    Comment: (NOTE) SARS-CoV-2 target nucleic acids are NOT DETECTED. The SARS-CoV-2 RNA is generally detectable in upper and lower respiratory specimens during the acute phase of infection. Negative results do not preclude SARS-CoV-2 infection, do not rule out co-infections with other pathogens, and should not be used as the sole basis for treatment or other patient management decisions. Negative results must be combined with clinical observations, patient history, and epidemiological information. The expected result is Negative. Fact Sheet for Patients: SugarRoll.be Fact Sheet for Healthcare Providers: https://www.woods-mathews.com/ This test is not yet approved or cleared by the [place] FDA and  has been authorized for detection and/or diagnosis of SARS-CoV-2 by FDA under an Emergency Use Authorization (EUA). This EUA will remain  in effect (meaning this test can be used) for the duration of the COVID-19 declaration under Section 56  4(b)(1) of the Act, 21 U.S.C. section 360bbb-3(b)(1), unless the authorization is terminated or revoked sooner. Performed at [facility], [place] [address], [place], [place] [address]    Time spent: 30 min  SIGNED:   Marylu Lund, MD  [facility] 04/14/2019, 12:52 PM  If 7PM-7AM, please contact night-coverage

## 2019-04-14 NOTE — Care Management Important Message (Signed)
Important Message  Patient Details  Name: [name] MRN: [id number] Date of Birth: 1940-08-20   Medicare Important Message Given:  Yes     Memory Argue 04/14/2019, 3:02 PM

## 2019-04-14 NOTE — Plan of Care (Signed)

## 2019-04-14 NOTE — TOC Transition Note (Signed)
Transition of Care [facility]) - CM/SW Discharge Note   Patient Details  Name: REASE SWINSON MRN: [id number] Date of Birth: [birth year]  Transition of Care [facility] CM/SW Contact:  Sharin Mons, RN Phone Number: [phone] 04/14/2019, 9:58 AM   Clinical Narrative:     Patient will DC to: [facility] Anticipated DC date: 04/14/2019 Family notified: Pamala Hurry Transport by: Corey Harold   Per MD patient ready for DC today. RN, patient, patient's family, and facility notified of DC. Discharge Summary and FL2 sent to facility. RN to call report prior to discharge [phone]). Rm # [id number] packet on chart. Ambulance transport requested for patient.    [name] (Daughter)      [phone]       RNCM will sign off for now as intervention is no longer needed. Please consult [place] again if new needs arise.   Final next level of care: Skilled Nursing Facility Barriers to Discharge: No Barriers Identified   Patient Goals and CMS Choice Patient states their goals for this hospitalization and ongoing recovery are:: get stronger before going home CMS Medicare.gov Compare Post Acute Care list provided to:: Patient Choice offered to / list presented to : Patient  Discharge Placement                       Discharge Plan and Services   Discharge Planning Services: CM Consult Post Acute Care Choice: Skilled Nursing Facility                               Social Determinants of Health (SDOH) Interventions     Readmission Risk Interventions Readmission Risk Prevention Plan 04/09/2019  Transportation Screening Complete  PCP or Specialist Appt within 3-5 Days Not Complete  Not Complete comments plan for SNF  HRI or [place] Not Complete  HRI or Home Care Consult comments plan for SNF  Social Work Consult for [place] Planning/Counseling Complete  Palliative Care Screening Not Applicable  Medication Review (RN Care Manager) Referral to Pharmacy  Some recent data  might be hidden

## 2019-04-14 NOTE — Progress Notes (Signed)
Attempted to call report to [facility]- I spoke with [facility] and left a call back number.  Awaiting to give  Report for discharge

## 2019-04-18 ENCOUNTER — Telehealth: Payer: Self-pay

## 2019-04-18 NOTE — Telephone Encounter (Signed)
After Hours Call:   Caller states she is calling about a patient who is getting therapy at another office. Caller states the therapist is not given her pain medication. Caller states the patient may her prescription records.  Additional Comment:  [place]: [phone] to report her medication records, so her mother can get a pain medication.

## 2019-04-18 NOTE — Telephone Encounter (Signed)
FYI  Called patient daughter back.  She stated that patient fell and broke 4 ribs and had to go to [facility] for rehab and they are not giving her pain medications when she asks for them.  Advised that we would be unable to called to advise anything as we do not know what medications she is on.  She then stated that she just wanted to know that her mom wanted to update you.

## 2019-04-19 DIAGNOSIS — J9611 Chronic respiratory failure with hypoxia: Secondary | ICD-10-CM | POA: Diagnosis not present

## 2019-04-19 DIAGNOSIS — S2242XD Multiple fractures of ribs, left side, subsequent encounter for fracture with routine healing: Secondary | ICD-10-CM | POA: Diagnosis not present

## 2019-04-19 DIAGNOSIS — R071 Chest pain on breathing: Secondary | ICD-10-CM | POA: Diagnosis not present

## 2019-04-19 DIAGNOSIS — J449 Chronic obstructive pulmonary disease, unspecified: Secondary | ICD-10-CM | POA: Diagnosis not present

## 2019-04-19 DIAGNOSIS — G8929 Other chronic pain: Secondary | ICD-10-CM | POA: Diagnosis not present

## 2019-04-19 DIAGNOSIS — E119 Type 2 diabetes mellitus without complications: Secondary | ICD-10-CM | POA: Diagnosis not present

## 2019-04-19 DIAGNOSIS — R5381 Other malaise: Secondary | ICD-10-CM | POA: Diagnosis not present

## 2019-04-19 DIAGNOSIS — N184 Chronic kidney disease, stage 4 (severe): Secondary | ICD-10-CM | POA: Diagnosis not present

## 2019-04-19 DIAGNOSIS — I10 Essential (primary) hypertension: Secondary | ICD-10-CM | POA: Diagnosis not present

## 2019-04-19 DIAGNOSIS — R262 Difficulty in walking, not elsewhere classified: Secondary | ICD-10-CM | POA: Diagnosis not present

## 2019-04-21 DIAGNOSIS — J9611 Chronic respiratory failure with hypoxia: Secondary | ICD-10-CM | POA: Diagnosis not present

## 2019-04-21 DIAGNOSIS — S2242XS Multiple fractures of ribs, left side, sequela: Secondary | ICD-10-CM | POA: Diagnosis not present

## 2019-04-21 DIAGNOSIS — R52 Pain, unspecified: Secondary | ICD-10-CM | POA: Diagnosis not present

## 2019-04-21 DIAGNOSIS — J449 Chronic obstructive pulmonary disease, unspecified: Secondary | ICD-10-CM | POA: Diagnosis not present

## 2019-04-21 DIAGNOSIS — R0989 Other specified symptoms and signs involving the circulatory and respiratory systems: Secondary | ICD-10-CM | POA: Diagnosis not present

## 2019-04-21 DIAGNOSIS — I517 Cardiomegaly: Secondary | ICD-10-CM | POA: Diagnosis not present

## 2019-04-21 DIAGNOSIS — R05 Cough: Secondary | ICD-10-CM | POA: Diagnosis not present

## 2019-04-21 DIAGNOSIS — I5042 Chronic combined systolic (congestive) and diastolic (congestive) heart failure: Secondary | ICD-10-CM | POA: Diagnosis not present

## 2019-04-27 DIAGNOSIS — K219 Gastro-esophageal reflux disease without esophagitis: Secondary | ICD-10-CM | POA: Diagnosis not present

## 2019-04-27 DIAGNOSIS — R0989 Other specified symptoms and signs involving the circulatory and respiratory systems: Secondary | ICD-10-CM | POA: Diagnosis not present

## 2019-04-27 DIAGNOSIS — R52 Pain, unspecified: Secondary | ICD-10-CM | POA: Diagnosis not present

## 2019-04-27 DIAGNOSIS — R05 Cough: Secondary | ICD-10-CM | POA: Diagnosis not present

## 2019-04-27 DIAGNOSIS — J449 Chronic obstructive pulmonary disease, unspecified: Secondary | ICD-10-CM | POA: Diagnosis not present

## 2019-04-27 DIAGNOSIS — S2242XS Multiple fractures of ribs, left side, sequela: Secondary | ICD-10-CM | POA: Diagnosis not present

## 2019-04-27 DIAGNOSIS — I5042 Chronic combined systolic (congestive) and diastolic (congestive) heart failure: Secondary | ICD-10-CM | POA: Diagnosis not present

## 2019-04-27 DIAGNOSIS — J9611 Chronic respiratory failure with hypoxia: Secondary | ICD-10-CM | POA: Diagnosis not present

## 2019-04-28 DIAGNOSIS — M6281 Muscle weakness (generalized): Secondary | ICD-10-CM | POA: Diagnosis not present

## 2019-04-28 DIAGNOSIS — N184 Chronic kidney disease, stage 4 (severe): Secondary | ICD-10-CM | POA: Diagnosis not present

## 2019-04-28 DIAGNOSIS — F339 Major depressive disorder, recurrent, unspecified: Secondary | ICD-10-CM | POA: Diagnosis not present

## 2019-04-28 DIAGNOSIS — S2242XS Multiple fractures of ribs, left side, sequela: Secondary | ICD-10-CM | POA: Diagnosis not present

## 2019-04-28 DIAGNOSIS — I5042 Chronic combined systolic (congestive) and diastolic (congestive) heart failure: Secondary | ICD-10-CM | POA: Diagnosis not present

## 2019-04-28 DIAGNOSIS — M353 Polymyalgia rheumatica: Secondary | ICD-10-CM | POA: Diagnosis not present

## 2019-04-28 DIAGNOSIS — J449 Chronic obstructive pulmonary disease, unspecified: Secondary | ICD-10-CM | POA: Diagnosis not present

## 2019-05-03 DIAGNOSIS — J9612 Chronic respiratory failure with hypercapnia: Secondary | ICD-10-CM | POA: Diagnosis not present

## 2019-05-03 DIAGNOSIS — J449 Chronic obstructive pulmonary disease, unspecified: Secondary | ICD-10-CM | POA: Diagnosis not present

## 2019-05-03 DIAGNOSIS — N184 Chronic kidney disease, stage 4 (severe): Secondary | ICD-10-CM | POA: Diagnosis not present

## 2019-05-03 DIAGNOSIS — E1122 Type 2 diabetes mellitus with diabetic chronic kidney disease: Secondary | ICD-10-CM | POA: Diagnosis not present

## 2019-05-03 DIAGNOSIS — S2242XD Multiple fractures of ribs, left side, subsequent encounter for fracture with routine healing: Secondary | ICD-10-CM | POA: Diagnosis not present

## 2019-05-03 DIAGNOSIS — E782 Mixed hyperlipidemia: Secondary | ICD-10-CM | POA: Diagnosis not present

## 2019-05-03 DIAGNOSIS — J9611 Chronic respiratory failure with hypoxia: Secondary | ICD-10-CM | POA: Diagnosis not present

## 2019-05-03 DIAGNOSIS — I5042 Chronic combined systolic (congestive) and diastolic (congestive) heart failure: Secondary | ICD-10-CM | POA: Diagnosis not present

## 2019-05-03 DIAGNOSIS — I130 Hypertensive heart and chronic kidney disease with heart failure and stage 1 through stage 4 chronic kidney disease, or unspecified chronic kidney disease: Secondary | ICD-10-CM | POA: Diagnosis not present

## 2019-05-04 DIAGNOSIS — I130 Hypertensive heart and chronic kidney disease with heart failure and stage 1 through stage 4 chronic kidney disease, or unspecified chronic kidney disease: Secondary | ICD-10-CM | POA: Diagnosis not present

## 2019-05-04 DIAGNOSIS — J449 Chronic obstructive pulmonary disease, unspecified: Secondary | ICD-10-CM | POA: Diagnosis not present

## 2019-05-04 DIAGNOSIS — N184 Chronic kidney disease, stage 4 (severe): Secondary | ICD-10-CM | POA: Diagnosis not present

## 2019-05-04 DIAGNOSIS — J9611 Chronic respiratory failure with hypoxia: Secondary | ICD-10-CM | POA: Diagnosis not present

## 2019-05-04 DIAGNOSIS — E1122 Type 2 diabetes mellitus with diabetic chronic kidney disease: Secondary | ICD-10-CM | POA: Diagnosis not present

## 2019-05-04 DIAGNOSIS — J9612 Chronic respiratory failure with hypercapnia: Secondary | ICD-10-CM | POA: Diagnosis not present

## 2019-05-04 DIAGNOSIS — S2242XD Multiple fractures of ribs, left side, subsequent encounter for fracture with routine healing: Secondary | ICD-10-CM | POA: Diagnosis not present

## 2019-05-04 DIAGNOSIS — E782 Mixed hyperlipidemia: Secondary | ICD-10-CM | POA: Diagnosis not present

## 2019-05-04 DIAGNOSIS — I5042 Chronic combined systolic (congestive) and diastolic (congestive) heart failure: Secondary | ICD-10-CM | POA: Diagnosis not present

## 2019-05-06 ENCOUNTER — Ambulatory Visit: Payer: Medicare HMO | Admitting: Family

## 2019-05-06 ENCOUNTER — Other Ambulatory Visit: Payer: Medicare HMO

## 2019-05-06 ENCOUNTER — Ambulatory Visit: Payer: Medicare HMO

## 2019-05-06 DIAGNOSIS — J9612 Chronic respiratory failure with hypercapnia: Secondary | ICD-10-CM | POA: Diagnosis not present

## 2019-05-06 DIAGNOSIS — I5042 Chronic combined systolic (congestive) and diastolic (congestive) heart failure: Secondary | ICD-10-CM | POA: Diagnosis not present

## 2019-05-06 DIAGNOSIS — J449 Chronic obstructive pulmonary disease, unspecified: Secondary | ICD-10-CM | POA: Diagnosis not present

## 2019-05-06 DIAGNOSIS — E1122 Type 2 diabetes mellitus with diabetic chronic kidney disease: Secondary | ICD-10-CM | POA: Diagnosis not present

## 2019-05-06 DIAGNOSIS — N184 Chronic kidney disease, stage 4 (severe): Secondary | ICD-10-CM | POA: Diagnosis not present

## 2019-05-06 DIAGNOSIS — E782 Mixed hyperlipidemia: Secondary | ICD-10-CM | POA: Diagnosis not present

## 2019-05-06 DIAGNOSIS — S2242XD Multiple fractures of ribs, left side, subsequent encounter for fracture with routine healing: Secondary | ICD-10-CM | POA: Diagnosis not present

## 2019-05-06 DIAGNOSIS — I130 Hypertensive heart and chronic kidney disease with heart failure and stage 1 through stage 4 chronic kidney disease, or unspecified chronic kidney disease: Secondary | ICD-10-CM | POA: Diagnosis not present

## 2019-05-06 DIAGNOSIS — J9611 Chronic respiratory failure with hypoxia: Secondary | ICD-10-CM | POA: Diagnosis not present

## 2019-05-09 ENCOUNTER — Inpatient Hospital Stay: Payer: Medicare HMO

## 2019-05-09 ENCOUNTER — Inpatient Hospital Stay: Payer: Medicare HMO | Admitting: Family

## 2019-05-10 DIAGNOSIS — I5042 Chronic combined systolic (congestive) and diastolic (congestive) heart failure: Secondary | ICD-10-CM | POA: Diagnosis not present

## 2019-05-10 DIAGNOSIS — J449 Chronic obstructive pulmonary disease, unspecified: Secondary | ICD-10-CM | POA: Diagnosis not present

## 2019-05-10 DIAGNOSIS — J9611 Chronic respiratory failure with hypoxia: Secondary | ICD-10-CM

## 2019-05-10 DIAGNOSIS — E782 Mixed hyperlipidemia: Secondary | ICD-10-CM | POA: Diagnosis not present

## 2019-05-10 DIAGNOSIS — Z9181 History of falling: Secondary | ICD-10-CM

## 2019-05-10 DIAGNOSIS — J9612 Chronic respiratory failure with hypercapnia: Secondary | ICD-10-CM | POA: Diagnosis not present

## 2019-05-10 DIAGNOSIS — Z86718 Personal history of other venous thrombosis and embolism: Secondary | ICD-10-CM

## 2019-05-10 DIAGNOSIS — F329 Major depressive disorder, single episode, unspecified: Secondary | ICD-10-CM

## 2019-05-10 DIAGNOSIS — M1990 Unspecified osteoarthritis, unspecified site: Secondary | ICD-10-CM

## 2019-05-10 DIAGNOSIS — M353 Polymyalgia rheumatica: Secondary | ICD-10-CM

## 2019-05-10 DIAGNOSIS — S2242XD Multiple fractures of ribs, left side, subsequent encounter for fracture with routine healing: Secondary | ICD-10-CM

## 2019-05-10 DIAGNOSIS — R32 Unspecified urinary incontinence: Secondary | ICD-10-CM

## 2019-05-10 DIAGNOSIS — M109 Gout, unspecified: Secondary | ICD-10-CM

## 2019-05-10 DIAGNOSIS — I130 Hypertensive heart and chronic kidney disease with heart failure and stage 1 through stage 4 chronic kidney disease, or unspecified chronic kidney disease: Secondary | ICD-10-CM | POA: Diagnosis not present

## 2019-05-10 DIAGNOSIS — G8929 Other chronic pain: Secondary | ICD-10-CM

## 2019-05-10 DIAGNOSIS — Z9981 Dependence on supplemental oxygen: Secondary | ICD-10-CM

## 2019-05-10 DIAGNOSIS — N184 Chronic kidney disease, stage 4 (severe): Secondary | ICD-10-CM | POA: Diagnosis not present

## 2019-05-10 DIAGNOSIS — E1122 Type 2 diabetes mellitus with diabetic chronic kidney disease: Secondary | ICD-10-CM | POA: Diagnosis not present

## 2019-05-12 ENCOUNTER — Ambulatory Visit: Payer: Medicare HMO | Admitting: Internal Medicine

## 2019-05-12 DIAGNOSIS — E782 Mixed hyperlipidemia: Secondary | ICD-10-CM | POA: Diagnosis not present

## 2019-05-12 DIAGNOSIS — J449 Chronic obstructive pulmonary disease, unspecified: Secondary | ICD-10-CM | POA: Diagnosis not present

## 2019-05-12 DIAGNOSIS — N184 Chronic kidney disease, stage 4 (severe): Secondary | ICD-10-CM | POA: Diagnosis not present

## 2019-05-12 DIAGNOSIS — S2242XD Multiple fractures of ribs, left side, subsequent encounter for fracture with routine healing: Secondary | ICD-10-CM | POA: Diagnosis not present

## 2019-05-12 DIAGNOSIS — J9612 Chronic respiratory failure with hypercapnia: Secondary | ICD-10-CM | POA: Diagnosis not present

## 2019-05-12 DIAGNOSIS — I130 Hypertensive heart and chronic kidney disease with heart failure and stage 1 through stage 4 chronic kidney disease, or unspecified chronic kidney disease: Secondary | ICD-10-CM | POA: Diagnosis not present

## 2019-05-12 DIAGNOSIS — J9611 Chronic respiratory failure with hypoxia: Secondary | ICD-10-CM | POA: Diagnosis not present

## 2019-05-12 DIAGNOSIS — I5042 Chronic combined systolic (congestive) and diastolic (congestive) heart failure: Secondary | ICD-10-CM | POA: Diagnosis not present

## 2019-05-12 DIAGNOSIS — E1122 Type 2 diabetes mellitus with diabetic chronic kidney disease: Secondary | ICD-10-CM | POA: Diagnosis not present

## 2019-05-13 ENCOUNTER — Inpatient Hospital Stay: Payer: Medicare HMO | Attending: Family

## 2019-05-13 ENCOUNTER — Inpatient Hospital Stay: Payer: Medicare HMO | Admitting: Family

## 2019-05-13 ENCOUNTER — Inpatient Hospital Stay: Payer: Medicare HMO

## 2019-05-13 DIAGNOSIS — J449 Chronic obstructive pulmonary disease, unspecified: Secondary | ICD-10-CM | POA: Diagnosis not present

## 2019-05-13 DIAGNOSIS — N184 Chronic kidney disease, stage 4 (severe): Secondary | ICD-10-CM | POA: Diagnosis not present

## 2019-05-13 DIAGNOSIS — E782 Mixed hyperlipidemia: Secondary | ICD-10-CM | POA: Diagnosis not present

## 2019-05-13 DIAGNOSIS — S2242XD Multiple fractures of ribs, left side, subsequent encounter for fracture with routine healing: Secondary | ICD-10-CM | POA: Diagnosis not present

## 2019-05-13 DIAGNOSIS — I130 Hypertensive heart and chronic kidney disease with heart failure and stage 1 through stage 4 chronic kidney disease, or unspecified chronic kidney disease: Secondary | ICD-10-CM | POA: Diagnosis not present

## 2019-05-13 DIAGNOSIS — J9612 Chronic respiratory failure with hypercapnia: Secondary | ICD-10-CM | POA: Diagnosis not present

## 2019-05-13 DIAGNOSIS — J9611 Chronic respiratory failure with hypoxia: Secondary | ICD-10-CM | POA: Diagnosis not present

## 2019-05-13 DIAGNOSIS — I5042 Chronic combined systolic (congestive) and diastolic (congestive) heart failure: Secondary | ICD-10-CM | POA: Diagnosis not present

## 2019-05-13 DIAGNOSIS — E1122 Type 2 diabetes mellitus with diabetic chronic kidney disease: Secondary | ICD-10-CM | POA: Diagnosis not present

## 2019-05-15 DIAGNOSIS — J4520 Mild intermittent asthma, uncomplicated: Secondary | ICD-10-CM | POA: Diagnosis not present

## 2019-05-15 DIAGNOSIS — R0902 Hypoxemia: Secondary | ICD-10-CM | POA: Diagnosis not present

## 2019-05-16 DIAGNOSIS — I130 Hypertensive heart and chronic kidney disease with heart failure and stage 1 through stage 4 chronic kidney disease, or unspecified chronic kidney disease: Secondary | ICD-10-CM | POA: Diagnosis not present

## 2019-05-16 DIAGNOSIS — S2242XD Multiple fractures of ribs, left side, subsequent encounter for fracture with routine healing: Secondary | ICD-10-CM | POA: Diagnosis not present

## 2019-05-16 DIAGNOSIS — N184 Chronic kidney disease, stage 4 (severe): Secondary | ICD-10-CM | POA: Diagnosis not present

## 2019-05-16 DIAGNOSIS — J9612 Chronic respiratory failure with hypercapnia: Secondary | ICD-10-CM | POA: Diagnosis not present

## 2019-05-16 DIAGNOSIS — E1122 Type 2 diabetes mellitus with diabetic chronic kidney disease: Secondary | ICD-10-CM | POA: Diagnosis not present

## 2019-05-16 DIAGNOSIS — E782 Mixed hyperlipidemia: Secondary | ICD-10-CM | POA: Diagnosis not present

## 2019-05-16 DIAGNOSIS — J9611 Chronic respiratory failure with hypoxia: Secondary | ICD-10-CM | POA: Diagnosis not present

## 2019-05-16 DIAGNOSIS — J449 Chronic obstructive pulmonary disease, unspecified: Secondary | ICD-10-CM | POA: Diagnosis not present

## 2019-05-16 DIAGNOSIS — I5042 Chronic combined systolic (congestive) and diastolic (congestive) heart failure: Secondary | ICD-10-CM | POA: Diagnosis not present

## 2019-05-17 DIAGNOSIS — I130 Hypertensive heart and chronic kidney disease with heart failure and stage 1 through stage 4 chronic kidney disease, or unspecified chronic kidney disease: Secondary | ICD-10-CM | POA: Diagnosis not present

## 2019-05-17 DIAGNOSIS — J9612 Chronic respiratory failure with hypercapnia: Secondary | ICD-10-CM | POA: Diagnosis not present

## 2019-05-17 DIAGNOSIS — J449 Chronic obstructive pulmonary disease, unspecified: Secondary | ICD-10-CM | POA: Diagnosis not present

## 2019-05-17 DIAGNOSIS — J9611 Chronic respiratory failure with hypoxia: Secondary | ICD-10-CM | POA: Diagnosis not present

## 2019-05-17 DIAGNOSIS — N184 Chronic kidney disease, stage 4 (severe): Secondary | ICD-10-CM | POA: Diagnosis not present

## 2019-05-17 DIAGNOSIS — E1122 Type 2 diabetes mellitus with diabetic chronic kidney disease: Secondary | ICD-10-CM | POA: Diagnosis not present

## 2019-05-17 DIAGNOSIS — E782 Mixed hyperlipidemia: Secondary | ICD-10-CM | POA: Diagnosis not present

## 2019-05-17 DIAGNOSIS — S2242XD Multiple fractures of ribs, left side, subsequent encounter for fracture with routine healing: Secondary | ICD-10-CM | POA: Diagnosis not present

## 2019-05-17 DIAGNOSIS — I5042 Chronic combined systolic (congestive) and diastolic (congestive) heart failure: Secondary | ICD-10-CM | POA: Diagnosis not present

## 2019-05-19 DIAGNOSIS — N184 Chronic kidney disease, stage 4 (severe): Secondary | ICD-10-CM | POA: Diagnosis not present

## 2019-05-19 DIAGNOSIS — J449 Chronic obstructive pulmonary disease, unspecified: Secondary | ICD-10-CM | POA: Diagnosis not present

## 2019-05-19 DIAGNOSIS — E1122 Type 2 diabetes mellitus with diabetic chronic kidney disease: Secondary | ICD-10-CM | POA: Diagnosis not present

## 2019-05-19 DIAGNOSIS — S2242XD Multiple fractures of ribs, left side, subsequent encounter for fracture with routine healing: Secondary | ICD-10-CM | POA: Diagnosis not present

## 2019-05-19 DIAGNOSIS — E782 Mixed hyperlipidemia: Secondary | ICD-10-CM | POA: Diagnosis not present

## 2019-05-19 DIAGNOSIS — I5042 Chronic combined systolic (congestive) and diastolic (congestive) heart failure: Secondary | ICD-10-CM | POA: Diagnosis not present

## 2019-05-19 DIAGNOSIS — J9612 Chronic respiratory failure with hypercapnia: Secondary | ICD-10-CM | POA: Diagnosis not present

## 2019-05-19 DIAGNOSIS — I130 Hypertensive heart and chronic kidney disease with heart failure and stage 1 through stage 4 chronic kidney disease, or unspecified chronic kidney disease: Secondary | ICD-10-CM | POA: Diagnosis not present

## 2019-05-19 DIAGNOSIS — J9611 Chronic respiratory failure with hypoxia: Secondary | ICD-10-CM | POA: Diagnosis not present

## 2019-05-23 ENCOUNTER — Inpatient Hospital Stay: Payer: Medicare HMO | Attending: Family

## 2019-05-23 ENCOUNTER — Other Ambulatory Visit: Payer: Self-pay

## 2019-05-23 ENCOUNTER — Encounter: Payer: Self-pay | Admitting: Family

## 2019-05-23 ENCOUNTER — Inpatient Hospital Stay: Payer: Medicare HMO

## 2019-05-23 ENCOUNTER — Inpatient Hospital Stay (HOSPITAL_BASED_OUTPATIENT_CLINIC_OR_DEPARTMENT_OTHER): Payer: Medicare HMO | Admitting: Family

## 2019-05-23 VITALS — BP 139/84 | HR 84 | Temp 96.8°F | Resp 19 | Wt 184.0 lb

## 2019-05-23 DIAGNOSIS — N183 Chronic kidney disease, stage 3 unspecified: Secondary | ICD-10-CM | POA: Insufficient documentation

## 2019-05-23 DIAGNOSIS — I5042 Chronic combined systolic (congestive) and diastolic (congestive) heart failure: Secondary | ICD-10-CM | POA: Diagnosis not present

## 2019-05-23 DIAGNOSIS — S2242XD Multiple fractures of ribs, left side, subsequent encounter for fracture with routine healing: Secondary | ICD-10-CM | POA: Diagnosis not present

## 2019-05-23 DIAGNOSIS — D509 Iron deficiency anemia, unspecified: Secondary | ICD-10-CM | POA: Diagnosis not present

## 2019-05-23 DIAGNOSIS — D631 Anemia in chronic kidney disease: Secondary | ICD-10-CM

## 2019-05-23 DIAGNOSIS — E782 Mixed hyperlipidemia: Secondary | ICD-10-CM | POA: Diagnosis not present

## 2019-05-23 DIAGNOSIS — J9612 Chronic respiratory failure with hypercapnia: Secondary | ICD-10-CM | POA: Diagnosis not present

## 2019-05-23 DIAGNOSIS — Z79899 Other long term (current) drug therapy: Secondary | ICD-10-CM | POA: Insufficient documentation

## 2019-05-23 DIAGNOSIS — D508 Other iron deficiency anemias: Secondary | ICD-10-CM | POA: Diagnosis not present

## 2019-05-23 DIAGNOSIS — I130 Hypertensive heart and chronic kidney disease with heart failure and stage 1 through stage 4 chronic kidney disease, or unspecified chronic kidney disease: Secondary | ICD-10-CM | POA: Diagnosis not present

## 2019-05-23 DIAGNOSIS — N184 Chronic kidney disease, stage 4 (severe): Secondary | ICD-10-CM

## 2019-05-23 DIAGNOSIS — J9611 Chronic respiratory failure with hypoxia: Secondary | ICD-10-CM | POA: Diagnosis not present

## 2019-05-23 DIAGNOSIS — E1122 Type 2 diabetes mellitus with diabetic chronic kidney disease: Secondary | ICD-10-CM | POA: Diagnosis not present

## 2019-05-23 DIAGNOSIS — J449 Chronic obstructive pulmonary disease, unspecified: Secondary | ICD-10-CM | POA: Diagnosis not present

## 2019-05-23 LAB — CBC WITH DIFFERENTIAL (CANCER CENTER ONLY)
Abs Immature Granulocytes: 0.12 10*3/uL — ABNORMAL HIGH (ref 0.00–0.07)
Basophils Absolute: 0 10*3/uL (ref 0.0–0.1)
Basophils Relative: 0 %
Eosinophils Absolute: 0.7 10*3/uL — ABNORMAL HIGH (ref 0.0–0.5)
Eosinophils Relative: 9 %
HCT: 31.6 % — ABNORMAL LOW (ref 36.0–46.0)
Hemoglobin: 9.7 g/dL — ABNORMAL LOW (ref 12.0–15.0)
Immature Granulocytes: 2 %
Lymphocytes Relative: 13 %
Lymphs Abs: 0.9 10*3/uL (ref 0.7–4.0)
MCH: 30.9 pg (ref 26.0–34.0)
MCHC: 30.7 g/dL (ref 30.0–36.0)
MCV: 100.6 fL — ABNORMAL HIGH (ref 80.0–100.0)
Monocytes Absolute: 1 10*3/uL (ref 0.1–1.0)
Monocytes Relative: 13 %
Neutro Abs: 4.7 10*3/uL (ref 1.7–7.7)
Neutrophils Relative %: 63 %
Platelet Count: 183 10*3/uL (ref 150–400)
RBC: 3.14 MIL/uL — ABNORMAL LOW (ref 3.87–5.11)
RDW: 13.3 % (ref 11.5–15.5)
WBC Count: 7.4 10*3/uL (ref 4.0–10.5)
nRBC: 0 % (ref 0.0–0.2)

## 2019-05-23 LAB — CMP (CANCER CENTER ONLY)
ALT: 8 U/L (ref 0–44)
AST: 11 U/L — ABNORMAL LOW (ref 15–41)
Albumin: 4.2 g/dL (ref 3.5–5.0)
Alkaline Phosphatase: 67 U/L (ref 38–126)
Anion gap: 10 (ref 5–15)
BUN: 40 mg/dL — ABNORMAL HIGH (ref 8–23)
CO2: 33 mmol/L — ABNORMAL HIGH (ref 22–32)
Calcium: 10.3 mg/dL (ref 8.9–10.3)
Chloride: 98 mmol/L (ref 98–111)
Creatinine: 1.81 mg/dL — ABNORMAL HIGH (ref 0.44–1.00)
GFR, Est AFR Am: 30 mL/min — ABNORMAL LOW (ref >60)
GFR, Estimated: 26 mL/min — ABNORMAL LOW (ref >60)
Glucose, Bld: 109 mg/dL — ABNORMAL HIGH (ref 70–99)
Potassium: 5.2 mmol/L — ABNORMAL HIGH (ref 3.5–5.1)
Sodium: 141 mmol/L (ref 135–145)
Total Bilirubin: 0.6 mg/dL (ref 0.3–1.2)
Total Protein: 7.2 g/dL (ref 6.5–8.1)

## 2019-05-23 LAB — RETICULOCYTES
Immature Retic Fract: 17.2 % — ABNORMAL HIGH (ref 2.3–15.9)
RBC.: 3.16 MIL/uL — ABNORMAL LOW (ref 3.87–5.11)
Retic Count, Absolute: 43.3 10*3/uL (ref 19.0–186.0)
Retic Ct Pct: 1.4 % (ref 0.4–3.1)

## 2019-05-23 MED ORDER — EPOETIN ALFA-EPBX 40000 UNIT/ML IJ SOLN
INTRAMUSCULAR | Status: AC
  Filled 2019-05-23: qty 1

## 2019-05-23 MED ORDER — EPOETIN ALFA-EPBX 40000 UNIT/ML IJ SOLN
40000.0000 [IU] | Freq: Once | INTRAMUSCULAR | Status: AC
  Administered 2019-05-23: 40000 [IU] via SUBCUTANEOUS

## 2019-05-23 NOTE — Patient Instructions (Signed)

## 2019-05-23 NOTE — Progress Notes (Signed)
Hematology and Oncology Follow Up Visit  [name] [id number] 1940-01-24 79 y.o. 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 is here today for follow-up and Aranesp injection. Hgb today is 9.7, MCV 100. She was hospitalized last month for a fall in her kitchen (tripped). This resulted in left sided rib fractures. She is now home and doing PT twice a week.  She states that she always feels fatigued.  She has SOB with any exertion and is on 2L supplemental O2 24 hours a day. She has not noted any blood loss. No bruising or petechiae.  No fever, chills, n/v, cough, rash, dizziness, chest pain, palpitations, abdominal pain or changes in bowel or bladder habits.  The swelling in her lower extremities seems to be improved. Pedal pulses are 2+.  No new falls to report. No syncopal episodes.  She has maintained a good appetite and is staying well hydrated. Her weight is stable.   ECOG Performance Status: 1 - Symptomatic but completely ambulatory  Medications:  Allergies as of 05/23/2019      Reactions   Montelukast Sodium Other (See Comments)   REACTION: HEART PALPITATIONS, CHEST PAIN.   Sulfa Antibiotics Other (See Comments)   CHEST PAIN   Sulfonamide Derivatives Other (See Comments)   CHEST PAIN   Oseltamivir Phosphate Diarrhea, Nausea And Vomiting, Other (See Comments)   TAMIFLU REACTION:  dizziness   Ace Inhibitors Other (See Comments)   PT. STATED UNKNOWN REACTION   Indomethacin Other (See Comments)   PT. STATED UNKNOWN REACTION      Medication List       Accurate as of May 23, 2019  3:26 PM. If you have any questions, ask your nurse or doctor.        Accu-Chek Aviva Plus test strip Generic drug: glucose blood   Accu-Chek Aviva Soln   Accu-Chek Softclix Lancets lancets   acetaminophen 500 MG  tablet Commonly known as: TYLENOL Take 500 mg by mouth 2 (two) times daily.   albuterol 0.63 MG/3ML nebulizer solution Commonly known as: ACCUNEB Take 3 mLs (0.63 mg total) by nebulization every 6 (six) hours as needed for wheezing or shortness of breath.   albuterol 108 (90 Base) MCG/ACT inhaler Commonly known as: VENTOLIN HFA Inhale 2 puffs into the lungs 4 (four) times daily as needed for wheezing or shortness of breath.   Alcohol Wipes 70 % Pads To use before checking blood sugars   allopurinol 100 MG tablet Commonly known as: ZYLOPRIM TAKE 2 TABLETS ONE TIME DAILY What changed: See the new instructions.   b complex vitamins tablet Take 1 tablet by mouth daily.   blood glucose meter kit and supplies Check blood sugars once weekly.   budesonide-formoterol 160-4.5 MCG/ACT inhaler Commonly known as: Symbicort Inhale 2 puffs into the lungs 2 (two) times daily.   cholecalciferol 1000 units tablet Commonly known as: VITAMIN D Take 1,000 Units daily by mouth.   docusate sodium 100 MG capsule Commonly known as: COLACE Take 1 capsule (100 mg total) by mouth 2 (two) times daily.   Ferrous Fumarate-Folic Acid 269-4 MG Tabs Commonly known as: Hemocyte-F Take 1 tablet by mouth daily.   fluticasone 50 MCG/ACT nasal spray Commonly known as: FLONASE Place 1 spray into both nostrils daily. What changed:   when  to take this  reasons to take this   furosemide 40 MG tablet Commonly known as: LASIX TAKE 1 TABLET DAILY AND A SECOND TABLET DAILY AS NEEDED FOR INCREASED EDEMA OR WEIGHT GAIN GREATER THAN 3 POUNDS IN 24 HOURS What changed:   how much to take  how to take this  when to take this  additional instructions   lidocaine 5 % Commonly known as: LIDODERM Place 2 patches onto the skin daily. Remove & Discard patch within 12 hours or as directed by MD   loratadine 10 MG tablet Commonly known as: CLARITIN Take 1 tablet (10 mg total) by mouth at bedtime. What  changed:   when to take this  reasons to take this   methocarbamol 750 MG tablet Commonly known as: ROBAXIN Take 1 tablet (750 mg total) by mouth 4 (four) times daily.   metoprolol succinate 50 MG 24 hr tablet Commonly known as: TOPROL-XL TAKE 1 AND 1/2 TABLETS EVERY DAY What changed: See the new instructions.   montelukast 10 MG tablet Commonly known as: SINGULAIR TAKE 1 TABLET AT BEDTIME   OXYGEN Inhale 2 L into the lungs continuous.   pantoprazole 40 MG tablet Commonly known as: PROTONIX Take 40 mg by mouth 2 (two) times daily.   polyethylene glycol 17 g packet Commonly known as: MIRALAX / GLYCOLAX Take 17 g by mouth daily.   pravastatin 40 MG tablet Commonly known as: PRAVACHOL Take 1 tablet (40 mg total) by mouth every evening.   PROBIOTIC PO Take 1 tablet by mouth daily.   sertraline 100 MG tablet Commonly known as: ZOLOFT TAKE 1 TABLET EVERY DAY   traMADol 50 MG tablet Commonly known as: ULTRAM Take 1 tablet (50 mg total) by mouth 3 (three) times daily as needed for moderate pain.   trolamine salicylate 10 % cream Commonly known as: ASPERCREME Apply 1 application topically as needed for muscle pain.   vitamin B-12 1000 MCG tablet Commonly known as: CYANOCOBALAMIN Take 1,000 mcg by mouth daily.       Allergies:  Allergies  Allergen Reactions  . Montelukast Sodium Other (See Comments)    REACTION: HEART PALPITATIONS, CHEST PAIN.  Marland Kitchen Sulfa Antibiotics Other (See Comments)    CHEST PAIN  . Sulfonamide Derivatives Other (See Comments)    CHEST PAIN  . Oseltamivir Phosphate Diarrhea, Nausea And Vomiting and Other (See Comments)    TAMIFLU REACTION:  dizziness  . Ace Inhibitors Other (See Comments)    PT. STATED UNKNOWN REACTION  . Indomethacin Other (See Comments)    PT. STATED UNKNOWN REACTION    Past Medical History, Surgical history, Social history, and Family History were reviewed and updated.  Review of Systems: All other 10 point  review of systems is negative.   Physical Exam:  vitals were not taken for this visit.   Wt Readings from Last 3 Encounters:  04/07/19 186 lb (84.4 kg)  04/06/19 187 lb 1.9 oz (84.9 kg)  03/09/19 188 lb 1.9 oz (85.3 kg)    Ocular: Sclerae unicteric, pupils equal, round and reactive to light Ear-nose-throat: Oropharynx clear, dentition fair Lymphatic: No cervical or supraclavicular adenopathy Lungs no rales or rhonchi, good excursion bilaterally Heart regular rate and rhythm, no murmur appreciated Abd soft, nontender, positive bowel sounds, no liver or spleen tip palpated on exam, no fluid wave  MSK no focal spinal tenderness, no joint edema Neuro: non-focal, well-oriented, appropriate affect Breasts: Deferred   Lab Results  Component Value Date   WBC 7.4  05/23/2019   HGB 9.7 (L) 05/23/2019   HCT 31.6 (L) 05/23/2019   MCV 100.6 (H) 05/23/2019   PLT 183 05/23/2019   Lab Results  Component Value Date   FERRITIN 475 (H) 04/06/2019   IRON 113 04/06/2019   TIBC 248 04/06/2019   UIBC 134 04/06/2019   IRONPCTSAT 46 04/06/2019   Lab Results  Component Value Date   RETICCTPCT 1.4 05/23/2019   RBC 3.14 (L) 05/23/2019   RBC 3.16 (L) 05/23/2019   No results found for: KPAFRELGTCHN, LAMBDASER, KAPLAMBRATIO No results found for: IGGSERUM, IGA, IGMSERUM No results found for: Odetta Pink, SPEI   Chemistry      Component Value Date/Time   NA 143 04/12/2019 0340   NA 143 07/21/2017 1501   K 4.2 04/12/2019 0340   CL 109 04/12/2019 0340   CO2 27 04/12/2019 0340   BUN 38 (H) 04/12/2019 0340   BUN 41 (H) 07/21/2017 1501   CREATININE 1.38 (H) 04/12/2019 0340   CREATININE 2.01 (H) 04/06/2019 1105   CREATININE 1.36 (H) 08/23/2013 1134      Component Value Date/Time   CALCIUM 8.9 04/12/2019 0340   ALKPHOS 44 04/06/2019 1105   AST 12 (L) 04/06/2019 1105   ALT 8 04/06/2019 1105   BILITOT 0.4 04/06/2019 1105        Impression and Plan: Ms. Durley is a very pleasant 79 yo caucasian female with multifactorial anemia. She received Retacrit today for Hgb 9.7.  Iron studies are pending and we will replace if needed.  We will see her back in another month for follow-up.  She will contact our office with nay questions or concerns. We can certainly see her sooner if needed.   Laverna Peace, NP 5/3/20213:26 PM

## 2019-05-24 ENCOUNTER — Other Ambulatory Visit: Payer: Self-pay | Admitting: Family

## 2019-05-24 ENCOUNTER — Telehealth: Payer: Self-pay | Admitting: Family

## 2019-05-24 DIAGNOSIS — J9611 Chronic respiratory failure with hypoxia: Secondary | ICD-10-CM | POA: Diagnosis not present

## 2019-05-24 DIAGNOSIS — I130 Hypertensive heart and chronic kidney disease with heart failure and stage 1 through stage 4 chronic kidney disease, or unspecified chronic kidney disease: Secondary | ICD-10-CM | POA: Diagnosis not present

## 2019-05-24 DIAGNOSIS — N184 Chronic kidney disease, stage 4 (severe): Secondary | ICD-10-CM | POA: Diagnosis not present

## 2019-05-24 DIAGNOSIS — E782 Mixed hyperlipidemia: Secondary | ICD-10-CM | POA: Diagnosis not present

## 2019-05-24 DIAGNOSIS — J9612 Chronic respiratory failure with hypercapnia: Secondary | ICD-10-CM | POA: Diagnosis not present

## 2019-05-24 DIAGNOSIS — S2242XD Multiple fractures of ribs, left side, subsequent encounter for fracture with routine healing: Secondary | ICD-10-CM | POA: Diagnosis not present

## 2019-05-24 DIAGNOSIS — E1122 Type 2 diabetes mellitus with diabetic chronic kidney disease: Secondary | ICD-10-CM | POA: Diagnosis not present

## 2019-05-24 DIAGNOSIS — J449 Chronic obstructive pulmonary disease, unspecified: Secondary | ICD-10-CM | POA: Diagnosis not present

## 2019-05-24 DIAGNOSIS — I5042 Chronic combined systolic (congestive) and diastolic (congestive) heart failure: Secondary | ICD-10-CM | POA: Diagnosis not present

## 2019-05-24 LAB — IRON AND TIBC
Iron: 40 ug/dL — ABNORMAL LOW (ref 41–142)
Saturation Ratios: 18 % — ABNORMAL LOW (ref 21–57)
TIBC: 220 ug/dL — ABNORMAL LOW (ref 236–444)
UIBC: 180 ug/dL (ref 120–384)

## 2019-05-24 LAB — FERRITIN: Ferritin: 740 ng/mL — ABNORMAL HIGH (ref 11–307)

## 2019-05-24 NOTE — Telephone Encounter (Signed)
Called and advised patient of appointments scheduled calendar mailed as well per 5/3 los

## 2019-05-25 ENCOUNTER — Ambulatory Visit: Payer: Medicare HMO | Admitting: Acute Care

## 2019-05-26 ENCOUNTER — Telehealth: Payer: Self-pay | Admitting: Family Medicine

## 2019-05-26 NOTE — Telephone Encounter (Signed)
FYI

## 2019-05-26 NOTE — Telephone Encounter (Signed)
Caller: Lysbeth Galas  Call Back # [phone]   Per Lysbeth Galas (PT), Patient is missing a (OT) visit today due to vomiting.

## 2019-05-27 DIAGNOSIS — J9611 Chronic respiratory failure with hypoxia: Secondary | ICD-10-CM | POA: Diagnosis not present

## 2019-05-27 DIAGNOSIS — S2242XD Multiple fractures of ribs, left side, subsequent encounter for fracture with routine healing: Secondary | ICD-10-CM | POA: Diagnosis not present

## 2019-05-27 DIAGNOSIS — I130 Hypertensive heart and chronic kidney disease with heart failure and stage 1 through stage 4 chronic kidney disease, or unspecified chronic kidney disease: Secondary | ICD-10-CM | POA: Diagnosis not present

## 2019-05-27 DIAGNOSIS — J449 Chronic obstructive pulmonary disease, unspecified: Secondary | ICD-10-CM | POA: Diagnosis not present

## 2019-05-27 DIAGNOSIS — I5042 Chronic combined systolic (congestive) and diastolic (congestive) heart failure: Secondary | ICD-10-CM | POA: Diagnosis not present

## 2019-05-27 DIAGNOSIS — N184 Chronic kidney disease, stage 4 (severe): Secondary | ICD-10-CM | POA: Diagnosis not present

## 2019-05-27 DIAGNOSIS — E782 Mixed hyperlipidemia: Secondary | ICD-10-CM | POA: Diagnosis not present

## 2019-05-27 DIAGNOSIS — J9612 Chronic respiratory failure with hypercapnia: Secondary | ICD-10-CM | POA: Diagnosis not present

## 2019-05-27 DIAGNOSIS — E1122 Type 2 diabetes mellitus with diabetic chronic kidney disease: Secondary | ICD-10-CM | POA: Diagnosis not present

## 2019-05-29 DIAGNOSIS — S2242XS Multiple fractures of ribs, left side, sequela: Secondary | ICD-10-CM | POA: Diagnosis not present

## 2019-05-29 DIAGNOSIS — J449 Chronic obstructive pulmonary disease, unspecified: Secondary | ICD-10-CM | POA: Diagnosis not present

## 2019-05-29 DIAGNOSIS — I5042 Chronic combined systolic (congestive) and diastolic (congestive) heart failure: Secondary | ICD-10-CM | POA: Diagnosis not present

## 2019-05-30 ENCOUNTER — Ambulatory Visit: Payer: Medicare HMO | Admitting: Internal Medicine

## 2019-05-30 ENCOUNTER — Encounter: Payer: Self-pay | Admitting: Internal Medicine

## 2019-05-30 ENCOUNTER — Other Ambulatory Visit: Payer: Self-pay

## 2019-05-30 ENCOUNTER — Ambulatory Visit (INDEPENDENT_AMBULATORY_CARE_PROVIDER_SITE_OTHER): Payer: Medicare HMO

## 2019-05-30 DIAGNOSIS — J4530 Mild persistent asthma, uncomplicated: Secondary | ICD-10-CM

## 2019-05-30 DIAGNOSIS — S2242XA Multiple fractures of ribs, left side, initial encounter for closed fracture: Secondary | ICD-10-CM | POA: Diagnosis not present

## 2019-05-30 DIAGNOSIS — J9612 Chronic respiratory failure with hypercapnia: Secondary | ICD-10-CM | POA: Diagnosis not present

## 2019-05-30 DIAGNOSIS — J9611 Chronic respiratory failure with hypoxia: Secondary | ICD-10-CM | POA: Diagnosis not present

## 2019-05-30 NOTE — Progress Notes (Signed)
[name], female    DOB: 16-Jun-1940,   MRN: [id number]   Brief patient profile:  3 yowf  never smoker  new onset breathing problems 2011 eval by Dr [name] with dx asthma/bronchitis/copd with daily symptoms of sob no better with inhalers x sev years  Then  became 02 dep arouund 2017 referred to pulmonary clinic 02/22/2018 by Dr   [name]  With pft 04/26/13 c/w minimal airflow obst by curvature of f/v though overall pattern was restrictive assoc with mild kyphosis and chronic L HD elevation   History of Present Illness  02/22/2018  Pulmonary/ 1st office eval/[name]  Chief Complaint  Patient presents with  . Consult    Shortness of breath  Dyspnea:  MMRC2 = can't walk a nl pace on a flat grade s sob but does fine slow and flat only a little better with 02  Cough: constant sense something stuck in throat but min mucoid production - worse with exp to smoke or strong scents  Sleep: 45 degrees with electric bed  - lower levels has more trouble breathng SABA use: increased use since d/c'd singulair  rec Plan A = Automatic = Symbicort 160 Take 2 puffs first thing in am and then another 2 puffs about 12 hours later.  Work on inhaler technique:     Plan B = Backup Only use your albuterol inhaler  Plan C = Crisis - only use your albuterol nebulizer if you first try Plan B and it fails to help > ok to use the nebulizer up to every 4 hours but if start needing it regularly call for immediate appointment Goal is to keep your 02 saturations above 90%  Please schedule a follow up office visit in 4 weeks, sooner if needed with pfts on return     03/22/2018  f/u ov/[name] re:  ? Asthma on symb 160 poor hfa/ singulair  Chief Complaint  Patient presents with  . Follow-up    PFT's done today. Breathing is overall doing well. She is using her albuterol inhaler 5 x per wk on average.   Dyspnea:  MMRC2 = can't walk a nl pace on a flat grade s sob but does fine slow and flat shopping  Cough:  minimal  Sleeping: 45 degrees x 5 years  SABA use: way too much, used neb 3 h prior to pft testing "thought I was supposed to be using twice daily" - it's what I was told (see last ov that occurred well after she was told this) 02: 2lpm 24/7  rec Plan A = Automatic = Symbicort 160 Take 2 puffs first thing in am and then another 2 puffs about 12 hours later.  Work on inhaler technique:  Plan B = Backup Only use your albuterol inhaler as a rescue medication Plan C = Crisis - only use your albuterol nebulizer if you first try Plan B Goal is to keep your 02 saturations above 90%    11/11/2018  f/u ov/[name] re:  Chronic asthma / 02 dep  Chief Complaint  Patient presents with  . Follow-up    Breathing is overall doing well and no new co's. She is using her albuterol inhaler 5 x per wk and rarely uses neb.    Dyspnea:  Slow shopping x [employer] = MMRC2 = can't walk a nl pace on a flat grade s sob but does fine slow and flat on 1 lpm Cough: none Sleeping: 45 degrees elevated x years  SABA  use: occ if overdo it  02: 1lpm 24/7 hs and 2lpm with activity on POC  rec We will order a home 02 concentrator today at 1lpm   For now 02 rec is for 1lpm 24/7    Please schedule a follow up visit in 6 months but call sooner if needed  -correction :  02 flow should be 2lpm per POC   05/30/2019  f/u ov/[name] re: asthma/ had both covid shots/ maint on symbicort Chief Complaint  Patient presents with  . Follow-up    6 month f/u for asthma. States her breathing has been stable. Had a fall back in March 2021 where she broke several ribs.   Dyspnea:  [employer] shopping once [facility] = can't walk 100 yards even at a slow pace at a flat grade s stopping due to sob  On 3lpm  Cough: none Sleeping: 45 degrees  SABA use: rarely 02: 2lpm at home but bumps up to 3lpm POC  Still having L cp where fell and dx rib fx    No obvious day to day or daytime variability or assoc excess/ purulent sputum or mucus plugs or  hemoptysis or   chest tightness, subjective wheeze or overt sinus or hb symptoms.   Sleeping  without nocturnal  or early am exacerbation  of respiratory  c/o's or need for noct saba. Also denies any obvious fluctuation of symptoms with weather or environmental changes or other aggravating or alleviating factors except as outlined above   No unusual exposure hx or h/o childhood pna/ asthma or knowledge of premature birth.  Current Allergies, Complete Past Medical History, Past Surgical History, Family History, and Social History were reviewed in [employer] record.  ROS  The following are not active complaints unless bolded Hoarseness, sore throat, dysphagia, dental problems, itching, sneezing,  nasal congestion or discharge of excess mucus or purulent secretions, ear ache,   fever, chills, sweats, unintended wt loss or wt gain, classically pleuritic or exertional cp,  orthopnea pnd or arm/hand swelling  or leg swelling, presyncope, palpitations, abdominal pain, anorexia, nausea, vomiting, diarrhea  or change in bowel habits or change in bladder habits, change in stools or change in urine, dysuria, hematuria,  rash, arthralgias, visual complaints, headache, numbness, weakness or ataxia or problems with walking or coordination,  change in mood or  memory.        Current Meds  Medication Sig  . ACCU-CHEK AVIVA PLUS test strip   . Accu-Chek Softclix Lancets lancets   . acetaminophen (TYLENOL) 500 MG tablet Take 500 mg by mouth 2 (two) times daily.   Marland Kitchen albuterol (ACCUNEB) 0.63 MG/3ML nebulizer solution Take 3 mLs (0.63 mg total) by nebulization every 6 (six) hours as needed for wheezing or shortness of breath.  Marland Kitchen albuterol (VENTOLIN HFA) 108 (90 Base) MCG/ACT inhaler Inhale 2 puffs into the lungs 4 (four) times daily as needed for wheezing or shortness of breath.  . Alcohol Swabs (ALCOHOL WIPES) 70 % PADS To use before checking blood sugars  . allopurinol (ZYLOPRIM) 100 MG  tablet TAKE 2 TABLETS ONE TIME DAILY (Patient taking differently: Take 200 mg by mouth daily. )  . b complex vitamins tablet Take 1 tablet by mouth daily.  . Blood Glucose Calibration (ACCU-CHEK AVIVA) SOLN   . blood glucose meter kit and supplies Check blood sugars once weekly.  . budesonide-formoterol (SYMBICORT) 160-4.5 MCG/ACT inhaler Inhale 2 puffs into the lungs 2 (two) times daily.  . cholecalciferol (VITAMIN D) 1000 units  tablet Take 1,000 Units daily by mouth.  . docusate sodium (COLACE) 100 MG capsule Take 1 capsule (100 mg total) by mouth 2 (two) times daily.  . Ferrous Fumarate-Folic Acid (HEMOCYTE-F) 324-1 MG TABS Take 1 tablet by mouth daily.  . fluticasone (FLONASE) 50 MCG/ACT nasal spray Place 1 spray into both nostrils daily. (Patient taking differently: Place 1 spray into both nostrils daily as needed for allergies. )  . furosemide (LASIX) 40 MG tablet TAKE 1 TABLET DAILY AND A SECOND TABLET DAILY AS NEEDED FOR INCREASED EDEMA OR WEIGHT GAIN GREATER THAN 3 POUNDS IN 24 HOURS (Patient taking differently: Take 20 mg by mouth daily. )  . lidocaine (LIDODERM) 5 % Place 2 patches onto the skin daily. Remove & Discard patch within 12 hours or as directed by MD  . loratadine (CLARITIN) 10 MG tablet Take 1 tablet (10 mg total) by mouth at bedtime. (Patient taking differently: Take 10 mg by mouth at bedtime as needed for allergies. )  . methocarbamol (ROBAXIN) 750 MG tablet Take 1 tablet (750 mg total) by mouth 4 (four) times daily.  . metoprolol succinate (TOPROL-XL) 50 MG 24 hr tablet TAKE 1 AND 1/2 TABLETS EVERY DAY (Patient taking differently: Take 75 mg by mouth daily. )  . montelukast (SINGULAIR) 10 MG tablet TAKE 1 TABLET AT BEDTIME (Patient taking differently: Take 10 mg by mouth at bedtime. )  . OXYGEN Inhale 2 L into the lungs continuous.   . pantoprazole (PROTONIX) 40 MG tablet Take 40 mg by mouth 2 (two) times daily.  . polyethylene glycol (MIRALAX / GLYCOLAX) 17 g packet Take  17 g by mouth daily.  . pravastatin (PRAVACHOL) 40 MG tablet Take 1 tablet (40 mg total) by mouth every evening.  . Probiotic Product (PROBIOTIC PO) Take 1 tablet by mouth daily.   . sertraline (ZOLOFT) 100 MG tablet TAKE 1 TABLET EVERY DAY (Patient taking differently: Take 100 mg by mouth daily. )  . traMADol (ULTRAM) 50 MG tablet Take 1 tablet (50 mg total) by mouth 3 (three) times daily as needed for moderate pain.  Marland Kitchen trolamine salicylate (ASPERCREME) 10 % cream Apply 1 application topically as needed for muscle pain.  . vitamin B-12 (CYANOCOBALAMIN) 1000 MCG tablet Take 1,000 mcg by mouth daily.               Objective:     amb wf with rollator    05/30/2019        180 11/11/2018      188   03/22/18 190 lb (86.2 kg)  02/22/18 197 lb (89.4 kg)  02/18/18 189 lb 1.9 oz (85.8 kg)     Vital signs reviewed  05/30/2019  - Note at rest 02 sats  95% on 2lpm POC      Reports: Top dentures   HEENT : pt wearing mask not removed for exam due to covid - 19 concerns.    NECK :  without JVD/Nodes/TM/ nl carotid upstrokes bilaterally   LUNGS: no acc muscle use,  Mild barrel  contour chest wall with bilateral  Distant bs s audible wheeze and  without cough on insp or exp maneuvers  and mild  Hyperresonant  to  percussion bilaterally     CV:  RRR  no s3 or murmur or increase in P2, and trace bilateral LE pitting   ABD:  soft and nontender with pos end  insp Hoover's  in the supine position. No bruits or organomegaly appreciated, bowel sounds nl  MS:   Nl gait/  ext warm without deformities, calf tenderness, cyanosis or clubbing No obvious joint restrictions   SKIN: warm and dry without lesions    NEURO:  alert, approp, nl sensorium with  no motor or cerebellar deficits apparent.        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 Minimally displaced  fracture of the posterior left seventh rib.              Assessment

## 2019-05-30 NOTE — Patient Instructions (Addendum)
No change in medications  Please remember to go to the  x-ray department  for your tests - we will call you with the results when they are available    Please schedule a follow up visit in 6 months but call sooner if needed

## 2019-05-31 ENCOUNTER — Encounter: Payer: Self-pay | Admitting: Internal Medicine

## 2019-05-31 DIAGNOSIS — J449 Chronic obstructive pulmonary disease, unspecified: Secondary | ICD-10-CM | POA: Diagnosis not present

## 2019-05-31 DIAGNOSIS — E1122 Type 2 diabetes mellitus with diabetic chronic kidney disease: Secondary | ICD-10-CM | POA: Diagnosis not present

## 2019-05-31 DIAGNOSIS — I130 Hypertensive heart and chronic kidney disease with heart failure and stage 1 through stage 4 chronic kidney disease, or unspecified chronic kidney disease: Secondary | ICD-10-CM | POA: Diagnosis not present

## 2019-05-31 DIAGNOSIS — J9611 Chronic respiratory failure with hypoxia: Secondary | ICD-10-CM | POA: Diagnosis not present

## 2019-05-31 DIAGNOSIS — S2242XD Multiple fractures of ribs, left side, subsequent encounter for fracture with routine healing: Secondary | ICD-10-CM | POA: Diagnosis not present

## 2019-05-31 DIAGNOSIS — J9612 Chronic respiratory failure with hypercapnia: Secondary | ICD-10-CM | POA: Diagnosis not present

## 2019-05-31 DIAGNOSIS — I5042 Chronic combined systolic (congestive) and diastolic (congestive) heart failure: Secondary | ICD-10-CM | POA: Diagnosis not present

## 2019-05-31 DIAGNOSIS — N184 Chronic kidney disease, stage 4 (severe): Secondary | ICD-10-CM | POA: Diagnosis not present

## 2019-05-31 DIAGNOSIS — E782 Mixed hyperlipidemia: Secondary | ICD-10-CM | POA: Diagnosis not present

## 2019-05-31 NOTE — Assessment & Plan Note (Signed)
HCO3   02/18/2018   = 31  02/22/2018  Room Air at Rest = 88%  >>> while Ambulating = 87% and  on 3 Liters of oxygen while Ambulating = 94% - 03/22/2018   Walked 2lpm   1 laps @  approx 279ft each @ slow pace  stopped due to  Sob/desats resolved on 3lpm  And finished second lap s desats but stopped nonetheless with sob 11/11/2018  02 qualify Patient Saturations on Room Air at Rest = 100% andwhile Ambulating = 87% Patient Saturations on 2 Liters of oxygen while Ambulating = 94%  Advised: Make sure you check your oxygen saturations at highest level of activity to be sure it stays over 90% and adjust upward to maintain this level if needed but remember to turn it back to previous settings when you stop (to conserve your supply).

## 2019-05-31 NOTE — Assessment & Plan Note (Signed)
Never smoker - Allergy profile 03/11/13  IgE 11 RAST  - Alpha one AT Screen 03/11/13  Level 167  PFT's  04/26/13   FEV1 1.47 (67 % ) ratio 0.77  p no % improvement from saba p nothing prior to study with DLCO  64 % corrects to 77 % for alv volume with mild curvature   - PFT's  03/22/2018  FEV1 1.11 (54 % ) ratio 0.70  p no % improvement from saba p neb saba 3 h  prior to study with DLCO  53 % corrects to 90 % for alv volume and ERV 16%    With mild curvature  - 02/22/2018    continue symbicort 160 2bid  - 11/11/2018  After extensive coaching inhaler device,  effectiveness =    90%    - The proper method of use, as well as anticipated side effects, of a metered-dose inhaler are discussed and demonstrated to the patient   All goals of chronic asthma control met including optimal function and elimination of symptoms with minimal need for rescue therapy.  Contingencies discussed in full including contacting this office immediately if not controlling the symptoms using the rule of two's.

## 2019-05-31 NOTE — Assessment & Plan Note (Addendum)
Onset 04/07/19 - 05/30/2019  cxr reviewed, no complications, no change in rx needed           Each maintenance medication was reviewed in detail including emphasizing most importantly the difference between maintenance and prns and under what circumstances the prns are to be triggered using an action plan format where appropriate.  Total time for H and P, chart review, counseling, teaching device and generating customized AVS unique to this office visit / charting = 20 min

## 2019-06-01 DIAGNOSIS — J9612 Chronic respiratory failure with hypercapnia: Secondary | ICD-10-CM | POA: Diagnosis not present

## 2019-06-01 DIAGNOSIS — I130 Hypertensive heart and chronic kidney disease with heart failure and stage 1 through stage 4 chronic kidney disease, or unspecified chronic kidney disease: Secondary | ICD-10-CM | POA: Diagnosis not present

## 2019-06-01 DIAGNOSIS — E1122 Type 2 diabetes mellitus with diabetic chronic kidney disease: Secondary | ICD-10-CM | POA: Diagnosis not present

## 2019-06-01 DIAGNOSIS — E782 Mixed hyperlipidemia: Secondary | ICD-10-CM | POA: Diagnosis not present

## 2019-06-01 DIAGNOSIS — J9611 Chronic respiratory failure with hypoxia: Secondary | ICD-10-CM | POA: Diagnosis not present

## 2019-06-01 DIAGNOSIS — I5042 Chronic combined systolic (congestive) and diastolic (congestive) heart failure: Secondary | ICD-10-CM | POA: Diagnosis not present

## 2019-06-01 DIAGNOSIS — J449 Chronic obstructive pulmonary disease, unspecified: Secondary | ICD-10-CM | POA: Diagnosis not present

## 2019-06-01 DIAGNOSIS — N184 Chronic kidney disease, stage 4 (severe): Secondary | ICD-10-CM | POA: Diagnosis not present

## 2019-06-01 DIAGNOSIS — S2242XD Multiple fractures of ribs, left side, subsequent encounter for fracture with routine healing: Secondary | ICD-10-CM | POA: Diagnosis not present

## 2019-06-01 NOTE — Progress Notes (Signed)
Spoke with pt and notified of results per Dr. Wert. Pt verbalized understanding and denied any questions. 

## 2019-06-02 DIAGNOSIS — N184 Chronic kidney disease, stage 4 (severe): Secondary | ICD-10-CM | POA: Diagnosis not present

## 2019-06-02 DIAGNOSIS — I5042 Chronic combined systolic (congestive) and diastolic (congestive) heart failure: Secondary | ICD-10-CM | POA: Diagnosis not present

## 2019-06-02 DIAGNOSIS — J9612 Chronic respiratory failure with hypercapnia: Secondary | ICD-10-CM | POA: Diagnosis not present

## 2019-06-02 DIAGNOSIS — J449 Chronic obstructive pulmonary disease, unspecified: Secondary | ICD-10-CM | POA: Diagnosis not present

## 2019-06-02 DIAGNOSIS — I130 Hypertensive heart and chronic kidney disease with heart failure and stage 1 through stage 4 chronic kidney disease, or unspecified chronic kidney disease: Secondary | ICD-10-CM | POA: Diagnosis not present

## 2019-06-02 DIAGNOSIS — J9611 Chronic respiratory failure with hypoxia: Secondary | ICD-10-CM | POA: Diagnosis not present

## 2019-06-02 DIAGNOSIS — S2242XD Multiple fractures of ribs, left side, subsequent encounter for fracture with routine healing: Secondary | ICD-10-CM | POA: Diagnosis not present

## 2019-06-02 DIAGNOSIS — E1122 Type 2 diabetes mellitus with diabetic chronic kidney disease: Secondary | ICD-10-CM | POA: Diagnosis not present

## 2019-06-02 DIAGNOSIS — E782 Mixed hyperlipidemia: Secondary | ICD-10-CM | POA: Diagnosis not present

## 2019-06-04 ENCOUNTER — Other Ambulatory Visit: Payer: Self-pay | Admitting: Family Medicine

## 2019-06-06 NOTE — Telephone Encounter (Signed)
Refill request for Tramadol Last OV--02/15/2019 Next scheduled OV--06/16/2019 Last refill --- #5 on 04/14/2019 UDS--12/23/2016

## 2019-06-07 DIAGNOSIS — E782 Mixed hyperlipidemia: Secondary | ICD-10-CM | POA: Diagnosis not present

## 2019-06-07 DIAGNOSIS — J9612 Chronic respiratory failure with hypercapnia: Secondary | ICD-10-CM | POA: Diagnosis not present

## 2019-06-07 DIAGNOSIS — I5042 Chronic combined systolic (congestive) and diastolic (congestive) heart failure: Secondary | ICD-10-CM | POA: Diagnosis not present

## 2019-06-07 DIAGNOSIS — N184 Chronic kidney disease, stage 4 (severe): Secondary | ICD-10-CM | POA: Diagnosis not present

## 2019-06-07 DIAGNOSIS — J449 Chronic obstructive pulmonary disease, unspecified: Secondary | ICD-10-CM | POA: Diagnosis not present

## 2019-06-07 DIAGNOSIS — S2242XD Multiple fractures of ribs, left side, subsequent encounter for fracture with routine healing: Secondary | ICD-10-CM | POA: Diagnosis not present

## 2019-06-07 DIAGNOSIS — I130 Hypertensive heart and chronic kidney disease with heart failure and stage 1 through stage 4 chronic kidney disease, or unspecified chronic kidney disease: Secondary | ICD-10-CM | POA: Diagnosis not present

## 2019-06-07 DIAGNOSIS — J9611 Chronic respiratory failure with hypoxia: Secondary | ICD-10-CM | POA: Diagnosis not present

## 2019-06-07 DIAGNOSIS — E1122 Type 2 diabetes mellitus with diabetic chronic kidney disease: Secondary | ICD-10-CM | POA: Diagnosis not present

## 2019-06-14 DIAGNOSIS — R0902 Hypoxemia: Secondary | ICD-10-CM | POA: Diagnosis not present

## 2019-06-14 DIAGNOSIS — J4520 Mild intermittent asthma, uncomplicated: Secondary | ICD-10-CM | POA: Diagnosis not present

## 2019-06-14 DIAGNOSIS — E1122 Type 2 diabetes mellitus with diabetic chronic kidney disease: Secondary | ICD-10-CM | POA: Diagnosis not present

## 2019-06-14 DIAGNOSIS — E782 Mixed hyperlipidemia: Secondary | ICD-10-CM | POA: Diagnosis not present

## 2019-06-14 DIAGNOSIS — I5042 Chronic combined systolic (congestive) and diastolic (congestive) heart failure: Secondary | ICD-10-CM | POA: Diagnosis not present

## 2019-06-14 DIAGNOSIS — J9612 Chronic respiratory failure with hypercapnia: Secondary | ICD-10-CM | POA: Diagnosis not present

## 2019-06-14 DIAGNOSIS — S2242XD Multiple fractures of ribs, left side, subsequent encounter for fracture with routine healing: Secondary | ICD-10-CM | POA: Diagnosis not present

## 2019-06-14 DIAGNOSIS — N184 Chronic kidney disease, stage 4 (severe): Secondary | ICD-10-CM | POA: Diagnosis not present

## 2019-06-14 DIAGNOSIS — J9611 Chronic respiratory failure with hypoxia: Secondary | ICD-10-CM | POA: Diagnosis not present

## 2019-06-14 DIAGNOSIS — I130 Hypertensive heart and chronic kidney disease with heart failure and stage 1 through stage 4 chronic kidney disease, or unspecified chronic kidney disease: Secondary | ICD-10-CM | POA: Diagnosis not present

## 2019-06-14 DIAGNOSIS — J449 Chronic obstructive pulmonary disease, unspecified: Secondary | ICD-10-CM | POA: Diagnosis not present

## 2019-06-16 ENCOUNTER — Ambulatory Visit (INDEPENDENT_AMBULATORY_CARE_PROVIDER_SITE_OTHER): Payer: Medicare HMO | Admitting: Family Medicine

## 2019-06-16 ENCOUNTER — Encounter: Payer: Self-pay | Admitting: Family Medicine

## 2019-06-16 VITALS — BP 118/58 | HR 86 | Temp 98.7°F | Resp 12 | Ht 64.0 in | Wt 183.6 lb

## 2019-06-16 DIAGNOSIS — E782 Mixed hyperlipidemia: Secondary | ICD-10-CM

## 2019-06-16 DIAGNOSIS — E559 Vitamin D deficiency, unspecified: Secondary | ICD-10-CM

## 2019-06-16 DIAGNOSIS — E118 Type 2 diabetes mellitus with unspecified complications: Secondary | ICD-10-CM | POA: Diagnosis not present

## 2019-06-16 DIAGNOSIS — N184 Chronic kidney disease, stage 4 (severe): Secondary | ICD-10-CM | POA: Diagnosis not present

## 2019-06-16 DIAGNOSIS — R7989 Other specified abnormal findings of blood chemistry: Secondary | ICD-10-CM

## 2019-06-16 DIAGNOSIS — S2242XA Multiple fractures of ribs, left side, initial encounter for closed fracture: Secondary | ICD-10-CM | POA: Diagnosis not present

## 2019-06-16 DIAGNOSIS — I10 Essential (primary) hypertension: Secondary | ICD-10-CM

## 2019-06-17 ENCOUNTER — Telehealth: Payer: Self-pay | Admitting: *Deleted

## 2019-06-17 NOTE — Telephone Encounter (Signed)
Needs to be schedule for 3 week lab only appointment per Dr. Charlett Blake.    Left message on machine for patient to call back to schedule.

## 2019-06-20 NOTE — Assessment & Plan Note (Signed)
Well controlled, no changes to meds. Encouraged heart healthy diet such as the DASH diet and exercise as tolerated.  °

## 2019-06-20 NOTE — Progress Notes (Signed)
Subjective:    Patient ID: [name], female    DOB: 05-14-1940, 79 y.o.   MRN: [id number]  Chief Complaint  Patient presents with  . Follow-up    HPI Patient is in today for follow up on chronic medical concerns. No recent febrile illness or hospitalizations. Her chest pain from her rib fracture is slowly improving. She is eating better and her strength is slowly improving. No recurrent fall recently. No complaints of polyuria or polydipsia.Denies CP/palp/SOB/HA/congestion/fevers/GI or GU c/o. Taking meds as prescribed  Past Medical History:  Diagnosis Date  . Abnormal glucose tolerance test   . Abnormal TSH 09/22/2016  . ACE-inhibitor cough   . Anemia 03/12/2014  . Arthritis   . Asthma    PFT 02/06/09 FEV1 1.41 (65%), FVC 1.92 (64), FEV1% 74, TLC 3.47 (71%), DLCO 48%, +BD  . Atypical chest pain    s/p cath, Normal coronaries, Non ST elevation myocardial infarction, Rt groin pseudoaneurysm  . Bacterial vaginosis 03/12/2014  . Cellulitis 06/22/2016  . Chronic kidney disease (CKD), stage III (moderate) 08/04/2016  . COPD (chronic obstructive pulmonary disease) ([place])   . Depression   . Diastolic heart failure ([place]) 04/07/2016  . DVT (deep venous thrombosis) ([place]) 1987  . GERD (gastroesophageal reflux disease)   . Gout   . Hypercalcemia 10/15/2014  . Hyperlipidemia   . Hypertension   . Hypoxia 10/15/2014  . Macular degeneration 04/10/2015  . Osteopenia 12/29/2006   Qualifier: Diagnosis of  By: Wynona Luna   . Pneumonia   . Polymyalgia rheumatica ([place]) 01/07/2016  . Renal insufficiency 09/22/2016  . Unspecified constipation 06/05/2013  . Vitamin D deficiency 01/01/2015    Past Surgical History:  Procedure Laterality Date  . APPENDECTOMY  1951  . TONSILLECTOMY  1950  . TUBAL LIGATION  1968    Family History  Problem Relation Age of Onset  . Asthma Sister   . Hypertension Sister   . Hyperlipidemia Sister   . Uterine cancer Sister   . Coronary artery disease Brother          x2  . Arthritis Brother   . Lung cancer Brother        smoker  . Hypertension Sister   . Arthritis Sister   . Hyperlipidemia Sister   . Emphysema Sister   . COPD Sister        smoker  . Heart disease Sister   . Hyperlipidemia Sister   . Hypertension Sister   . Arthritis Sister   . Emphysema Brother   . Heart disease Brother         smoker  . Heart attack Brother   . Mental illness Father   . Suicidality Father   . Heart disease Mother   . Hyperlipidemia Mother   . Heart attack Mother   . Epilepsy Daughter   . Hypertension Daughter   . Obesity Daughter   . COPD Brother        smoker  . Lung cancer Brother   . Coronary artery disease Other   . Colon polyps Sister     Social History   Socioeconomic History  . Marital status: Widowed    Spouse name: Not on file  . Number of children: 1  . Years of education: Not on file  . Highest education level: Not on file  Occupational History  . Occupation: Retired    Fish farm manager: [employer]  Tobacco Use  . Smoking status: Never Smoker  . Smokeless tobacco: Never  Used  Substance and Sexual Activity  . Alcohol use: No  . Drug use: No  . Sexual activity: Never    Comment: lives alone, no dietary restrictions  Other Topics Concern  . Not on file  Social History Narrative  . Not on file   Social Determinants of Health   Financial Resource Strain:   . Difficulty of Paying Living Expenses:   Food Insecurity:   . Worried About Charity fundraiser in the Last Year:   . Arboriculturist in the Last Year:   Transportation Needs:   . Film/video editor (Medical):   Marland Kitchen Lack of Transportation (Non-Medical):   Physical Activity:   . Days of Exercise per Week:   . Minutes of Exercise per Session:   Stress:   . Feeling of Stress :   Social Connections:   . Frequency of Communication with Friends and Family:   . Frequency of Social Gatherings with Friends and Family:   . Attends Religious Services:   . Active Member of  Clubs or Organizations:   . Attends Archivist Meetings:   Marland Kitchen Marital Status:   Intimate Partner Violence:   . Fear of Current or Ex-Partner:   . Emotionally Abused:   Marland Kitchen Physically Abused:   . Sexually Abused:     Outpatient Medications Prior to Visit  Medication Sig Dispense Refill  . ACCU-CHEK AVIVA PLUS test strip     . Accu-Chek Softclix Lancets lancets     . acetaminophen (TYLENOL) 500 MG tablet Take 500 mg by mouth 2 (two) times daily.     Marland Kitchen albuterol (ACCUNEB) 0.63 MG/3ML nebulizer solution Take 3 mLs (0.63 mg total) by nebulization every 6 (six) hours as needed for wheezing or shortness of breath. 150 mL 0  . albuterol (VENTOLIN HFA) 108 (90 Base) MCG/ACT inhaler Inhale 2 puffs into the lungs 4 (four) times daily as needed for wheezing or shortness of breath. 54 g 1  . Alcohol Swabs (ALCOHOL WIPES) 70 % PADS To use before checking blood sugars 100 each 1  . allopurinol (ZYLOPRIM) 100 MG tablet TAKE 2 TABLETS ONE TIME DAILY (Patient taking differently: Take 200 mg by mouth daily. ) 180 tablet 1  . b complex vitamins tablet Take 1 tablet by mouth daily.    . Blood Glucose Calibration (ACCU-CHEK AVIVA) SOLN     . blood glucose meter kit and supplies Check blood sugars once weekly. 1 each 0  . budesonide-formoterol (SYMBICORT) 160-4.5 MCG/ACT inhaler Inhale 2 puffs into the lungs 2 (two) times daily. 30.6 g 1  . cholecalciferol (VITAMIN D) 1000 units tablet Take 1,000 Units daily by mouth.    . docusate sodium (COLACE) 100 MG capsule Take 1 capsule (100 mg total) by mouth 2 (two) times daily. 10 capsule 0  . Ferrous Fumarate-Folic Acid (HEMOCYTE-F) 324-1 MG TABS Take 1 tablet by mouth daily. 90 each 1  . fluticasone (FLONASE) 50 MCG/ACT nasal spray Place 1 spray into both nostrils daily. (Patient taking differently: Place 1 spray into both nostrils daily as needed for allergies. ) 16 g 3  . furosemide (LASIX) 40 MG tablet TAKE 1 TABLET DAILY AND A SECOND TABLET DAILY AS NEEDED  FOR INCREASED EDEMA OR WEIGHT GAIN GREATER THAN 3 POUNDS IN 24 HOURS (Patient taking differently: Take 20 mg by mouth daily. ) 100 tablet 5  . lidocaine (LIDODERM) 5 % Place 2 patches onto the skin daily. Remove & Discard patch within 12 hours or  as directed by MD 5 patch 0  . loratadine (CLARITIN) 10 MG tablet Take 1 tablet (10 mg total) by mouth at bedtime. (Patient taking differently: Take 10 mg by mouth at bedtime as needed for allergies. ) 30 tablet 5  . methocarbamol (ROBAXIN) 750 MG tablet Take 1 tablet (750 mg total) by mouth 4 (four) times daily. 10 tablet 0  . metoprolol succinate (TOPROL-XL) 50 MG 24 hr tablet TAKE 1 AND 1/2 TABLETS EVERY DAY (Patient taking differently: Take 75 mg by mouth daily. ) 135 tablet 3  . montelukast (SINGULAIR) 10 MG tablet TAKE 1 TABLET AT BEDTIME (Patient taking differently: Take 10 mg by mouth at bedtime. ) 90 tablet 0  . OXYGEN Inhale 2 L into the lungs continuous.     . pantoprazole (PROTONIX) 40 MG tablet Take 40 mg by mouth 2 (two) times daily.    . polyethylene glycol (MIRALAX / GLYCOLAX) 17 g packet Take 17 g by mouth daily. 14 each 0  . pravastatin (PRAVACHOL) 40 MG tablet Take 1 tablet (40 mg total) by mouth every evening. 90 tablet 3  . Probiotic Product (PROBIOTIC PO) Take 1 tablet by mouth daily.     . sertraline (ZOLOFT) 100 MG tablet TAKE 1 TABLET EVERY DAY (Patient taking differently: Take 100 mg by mouth daily. ) 90 tablet 1  . traMADol (ULTRAM) 50 MG tablet TAKE 1 TABLET BY MOUTH THREE TIMES DAILY AS NEEDED FOR MODERATE PAIN 90 tablet 0  . trolamine salicylate (ASPERCREME) 10 % cream Apply 1 application topically as needed for muscle pain.    . vitamin B-12 (CYANOCOBALAMIN) 1000 MCG tablet Take 1,000 mcg by mouth daily.      No facility-administered medications prior to visit.    Allergies  Allergen Reactions  . Montelukast Sodium Other (See Comments)    REACTION: HEART PALPITATIONS, CHEST PAIN.  Marland Kitchen Sulfa Antibiotics Other (See Comments)     CHEST PAIN  . Sulfonamide Derivatives Other (See Comments)    CHEST PAIN  . Oseltamivir Phosphate Diarrhea, Nausea And Vomiting and Other (See Comments)    TAMIFLU REACTION:  dizziness  . Ace Inhibitors Other (See Comments)    PT. STATED UNKNOWN REACTION  . Indomethacin Other (See Comments)    PT. STATED UNKNOWN REACTION    Review of Systems  Constitutional: Positive for malaise/fatigue. Negative for fever.  HENT: Negative for congestion.   Eyes: Negative for blurred vision.  Respiratory: Negative for shortness of breath.   Cardiovascular: Positive for chest pain. Negative for palpitations and leg swelling.  Gastrointestinal: Negative for abdominal pain, blood in stool and nausea.  Genitourinary: Negative for dysuria and frequency.  Musculoskeletal: Positive for back pain. Negative for falls.  Skin: Negative for rash.  Neurological: Negative for dizziness, loss of consciousness and headaches.  Endo/Heme/Allergies: Negative for environmental allergies.  Psychiatric/Behavioral: Negative for depression. The patient is not nervous/anxious.        Objective:    Physical Exam Vitals and nursing note reviewed.  Constitutional:      General: She is not in acute distress.    Appearance: She is well-developed.  HENT:     Head: Normocephalic and atraumatic.     Nose: Nose normal.  Eyes:     General:        Right eye: No discharge.        Left eye: No discharge.  Cardiovascular:     Rate and Rhythm: Normal rate and regular rhythm.     Heart sounds: No  murmur.  Pulmonary:     Effort: Pulmonary effort is normal.     Breath sounds: Normal breath sounds.  Abdominal:     General: Bowel sounds are normal.     Palpations: Abdomen is soft.     Tenderness: There is no abdominal tenderness.  Musculoskeletal:     Cervical back: Normal range of motion and neck supple.  Skin:    General: Skin is warm and dry.  Neurological:     Mental Status: She is alert and oriented to person,  place, and time.     BP (!) 118/58 (BP Location: Left Arm, Cuff Size: Normal)   Pulse 86   Temp 98.7 F (37.1 C) (Temporal)   Resp 12   Ht '5\' 4"'$  (1.626 m)   Wt 183 lb 9.6 oz (83.3 kg)   SpO2 94% Comment: on 2 liters of O2  BMI 31.51 kg/m  Wt Readings from Last 3 Encounters:  06/16/19 183 lb 9.6 oz (83.3 kg)  05/30/19 180 lb (81.6 kg)  05/23/19 184 lb (83.5 kg)    Diabetic Foot Exam - Simple   No data filed     Lab Results  Component Value Date   WBC 7.4 05/23/2019   HGB 9.7 (L) 05/23/2019   HCT 31.6 (L) 05/23/2019   PLT 183 05/23/2019   GLUCOSE 109 (H) 05/23/2019   CHOL 143 03/24/2019   TRIG 126 03/24/2019   HDL 52 03/24/2019   LDLDIRECT 73.0 06/16/2017   LDLCALC 69 03/24/2019   ALT 8 05/23/2019   AST 11 (L) 05/23/2019   NA 141 05/23/2019   K 5.2 (H) 05/23/2019   CL 98 05/23/2019   CREATININE 1.81 (H) 05/23/2019   BUN 40 (H) 05/23/2019   CO2 33 (H) 05/23/2019   TSH 4.87 (H) 02/15/2019   INR 1.0 09/12/2006   HGBA1C 5.7 02/15/2019   MICROALBUR 0.8 02/28/2014    Lab Results  Component Value Date   TSH 4.87 (H) 02/15/2019   Lab Results  Component Value Date   WBC 7.4 05/23/2019   HGB 9.7 (L) 05/23/2019   HCT 31.6 (L) 05/23/2019   MCV 100.6 (H) 05/23/2019   PLT 183 05/23/2019   Lab Results  Component Value Date   NA 141 05/23/2019   K 5.2 (H) 05/23/2019   CO2 33 (H) 05/23/2019   GLUCOSE 109 (H) 05/23/2019   BUN 40 (H) 05/23/2019   CREATININE 1.81 (H) 05/23/2019   BILITOT 0.6 05/23/2019   ALKPHOS 67 05/23/2019   AST 11 (L) 05/23/2019   ALT 8 05/23/2019   PROT 7.2 05/23/2019   ALBUMIN 4.2 05/23/2019   CALCIUM 10.3 05/23/2019   ANIONGAP 10 05/23/2019   GFR 30.80 (L) 11/20/2017   Lab Results  Component Value Date   CHOL 143 03/24/2019   Lab Results  Component Value Date   HDL 52 03/24/2019   Lab Results  Component Value Date   LDLCALC 69 03/24/2019   Lab Results  Component Value Date   TRIG 126 03/24/2019   Lab Results  Component  Value Date   CHOLHDL 2.8 03/24/2019   Lab Results  Component Value Date   HGBA1C 5.7 02/15/2019       Assessment & Plan:   Problem List Items Addressed This Visit    Essential hypertension    Well controlled, no changes to meds. Encouraged heart healthy diet such as the DASH diet and exercise as tolerated.       Relevant Orders   CBC  Comprehensive metabolic panel   TSH   Diabetes type 2, controlled ([place]) - Primary    hgba1c acceptable, minimize simple carbs. Increase exercise as tolerated. 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)    Hydrate and monitor      Vitamin D deficiency    Supplement and monitor      High serum vitamin B12   Relevant Orders   Intrinsic Factor Antibodies   Rib fracture    Pain slowly improving         I am having [name] maintain her acetaminophen, vitamin B-12, OXYGEN, b complex vitamins, Probiotic Product (PROBIOTIC PO), loratadine, fluticasone, Ferrous Fumarate-Folic Acid, cholecalciferol, albuterol, trolamine salicylate, metoprolol succinate, albuterol, budesonide-formoterol, blood glucose meter kit and supplies, Alcohol Wipes, Accu-Chek Aviva, Accu-Chek Aviva Plus, Accu-Chek Softclix Lancets, pantoprazole, sertraline, furosemide, montelukast, allopurinol, pravastatin, docusate sodium, polyethylene glycol, methocarbamol, lidocaine, and traMADol.  No orders of the defined types were placed in this encounter.    Penni Homans, MD

## 2019-06-20 NOTE — Assessment & Plan Note (Signed)
Pain slowly improving

## 2019-06-20 NOTE — Assessment & Plan Note (Signed)
Supplement and monitor 

## 2019-06-20 NOTE — Assessment & Plan Note (Signed)
Encouraged heart healthy diet, increase exercise, avoid trans fats, consider a krill oil cap daily 

## 2019-06-20 NOTE — Assessment & Plan Note (Signed)
Hydrate and monitor 

## 2019-06-20 NOTE — Assessment & Plan Note (Signed)
hgba1c acceptable, minimize simple carbs. Increase exercise as tolerated. Continue current meds 

## 2019-06-23 ENCOUNTER — Inpatient Hospital Stay (HOSPITAL_BASED_OUTPATIENT_CLINIC_OR_DEPARTMENT_OTHER): Payer: Medicare HMO | Admitting: Family

## 2019-06-23 ENCOUNTER — Encounter: Payer: Self-pay | Admitting: Family

## 2019-06-23 ENCOUNTER — Inpatient Hospital Stay: Payer: Medicare HMO

## 2019-06-23 ENCOUNTER — Inpatient Hospital Stay: Payer: Medicare HMO | Attending: Family

## 2019-06-23 ENCOUNTER — Other Ambulatory Visit: Payer: Self-pay

## 2019-06-23 VITALS — BP 126/59 | HR 88 | Temp 97.6°F | Resp 17 | Ht 64.0 in | Wt 184.0 lb

## 2019-06-23 DIAGNOSIS — Z79899 Other long term (current) drug therapy: Secondary | ICD-10-CM | POA: Diagnosis not present

## 2019-06-23 DIAGNOSIS — N184 Chronic kidney disease, stage 4 (severe): Secondary | ICD-10-CM

## 2019-06-23 DIAGNOSIS — D509 Iron deficiency anemia, unspecified: Secondary | ICD-10-CM | POA: Diagnosis not present

## 2019-06-23 DIAGNOSIS — D631 Anemia in chronic kidney disease: Secondary | ICD-10-CM | POA: Insufficient documentation

## 2019-06-23 DIAGNOSIS — D508 Other iron deficiency anemias: Secondary | ICD-10-CM | POA: Diagnosis not present

## 2019-06-23 DIAGNOSIS — N183 Chronic kidney disease, stage 3 unspecified: Secondary | ICD-10-CM | POA: Insufficient documentation

## 2019-06-23 LAB — RETICULOCYTES
Immature Retic Fract: 15.2 % (ref 2.3–15.9)
RBC.: 3.1 MIL/uL — ABNORMAL LOW (ref 3.87–5.11)
Retic Count, Absolute: 54.9 10*3/uL (ref 19.0–186.0)
Retic Ct Pct: 1.8 % (ref 0.4–3.1)

## 2019-06-23 LAB — CBC WITH DIFFERENTIAL (CANCER CENTER ONLY)
Abs Immature Granulocytes: 0.04 10*3/uL (ref 0.00–0.07)
Basophils Absolute: 0 10*3/uL (ref 0.0–0.1)
Basophils Relative: 1 %
Eosinophils Absolute: 0.5 10*3/uL (ref 0.0–0.5)
Eosinophils Relative: 7 %
HCT: 31.2 % — ABNORMAL LOW (ref 36.0–46.0)
Hemoglobin: 9.5 g/dL — ABNORMAL LOW (ref 12.0–15.0)
Immature Granulocytes: 1 %
Lymphocytes Relative: 17 %
Lymphs Abs: 1.3 10*3/uL (ref 0.7–4.0)
MCH: 30.7 pg (ref 26.0–34.0)
MCHC: 30.4 g/dL (ref 30.0–36.0)
MCV: 101 fL — ABNORMAL HIGH (ref 80.0–100.0)
Monocytes Absolute: 0.9 10*3/uL (ref 0.1–1.0)
Monocytes Relative: 12 %
Neutro Abs: 4.7 10*3/uL (ref 1.7–7.7)
Neutrophils Relative %: 62 %
Platelet Count: 147 10*3/uL — ABNORMAL LOW (ref 150–400)
RBC: 3.09 MIL/uL — ABNORMAL LOW (ref 3.87–5.11)
RDW: 15.1 % (ref 11.5–15.5)
WBC Count: 7.4 10*3/uL (ref 4.0–10.5)
nRBC: 0 % (ref 0.0–0.2)

## 2019-06-23 LAB — CMP (CANCER CENTER ONLY)
ALT: 6 U/L (ref 0–44)
AST: 9 U/L — ABNORMAL LOW (ref 15–41)
Albumin: 4 g/dL (ref 3.5–5.0)
Alkaline Phosphatase: 52 U/L (ref 38–126)
Anion gap: 8 (ref 5–15)
BUN: 51 mg/dL — ABNORMAL HIGH (ref 8–23)
CO2: 33 mmol/L — ABNORMAL HIGH (ref 22–32)
Calcium: 10.3 mg/dL (ref 8.9–10.3)
Chloride: 101 mmol/L (ref 98–111)
Creatinine: 2 mg/dL — ABNORMAL HIGH (ref 0.44–1.00)
GFR, Est AFR Am: 27 mL/min — ABNORMAL LOW (ref >60)
GFR, Estimated: 23 mL/min — ABNORMAL LOW (ref >60)
Glucose, Bld: 146 mg/dL — ABNORMAL HIGH (ref 70–99)
Potassium: 4.2 mmol/L (ref 3.5–5.1)
Sodium: 142 mmol/L (ref 135–145)
Total Bilirubin: 0.4 mg/dL (ref 0.3–1.2)
Total Protein: 6.9 g/dL (ref 6.5–8.1)

## 2019-06-23 MED ORDER — EPOETIN ALFA-EPBX 40000 UNIT/ML IJ SOLN
INTRAMUSCULAR | Status: AC
  Filled 2019-06-23: qty 1

## 2019-06-23 MED ORDER — EPOETIN ALFA-EPBX 40000 UNIT/ML IJ SOLN
40000.0000 [IU] | Freq: Once | INTRAMUSCULAR | Status: AC
  Administered 2019-06-23: 40000 [IU] via SUBCUTANEOUS

## 2019-06-23 NOTE — Progress Notes (Signed)
Hematology and Oncology Follow Up Visit  SHAKINA CHOY [id number] 06/02/1940 79 y.o. 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 Gonterman is here today for follow-up and Retacrit. She is doing well and states that she has felt really good the last few days due to the low humidity level.  Hgb is stable at 9.5, MCV 101.  She has not noted any blood loss. No bruising or petechiae.  Her SOB has remained stable. She is on 2 L  supplemental O2 24 hours a day.  No fever, chills, n/v, cough, rash, dizziness, chest pain, palpitations, abdominal pain or changes in bowel or bladder habits.  No tenderness, numbness or tingling in her extremities at this time.  She has swelling in her feet and ankles that comes and goes. Pedal pulses are 2+.  No new falls since March. She has completed PT and is ambulating with her rolling walker for added support.  No syncopal episodes to report.  She has maintained a good appetite and is staying well hydrated. Her weight is stable.   ECOG Performance Status: 1 - Symptomatic but completely ambulatory  Medications:  Allergies as of 06/23/2019      Reactions   Montelukast Sodium Other (See Comments)   REACTION: HEART PALPITATIONS, CHEST PAIN.   Sulfa Antibiotics Other (See Comments)   CHEST PAIN   Sulfonamide Derivatives Other (See Comments)   CHEST PAIN   Oseltamivir Phosphate Diarrhea, Nausea And Vomiting, Other (See Comments)   TAMIFLU REACTION:  dizziness   Ace Inhibitors Other (See Comments)   PT. STATED UNKNOWN REACTION   Indomethacin Other (See Comments)   PT. STATED UNKNOWN REACTION      Medication List       Accurate as of June 23, 2019  2:14 PM. If you have any questions, ask your nurse or doctor.        Accu-Chek Aviva Plus test strip Generic drug: glucose blood   Accu-Chek Aviva  Soln   Accu-Chek Softclix Lancets lancets   acetaminophen 500 MG tablet Commonly known as: TYLENOL Take 500 mg by mouth 2 (two) times daily.   albuterol 0.63 MG/3ML nebulizer solution Commonly known as: ACCUNEB Take 3 mLs (0.63 mg total) by nebulization every 6 (six) hours as needed for wheezing or shortness of breath.   albuterol 108 (90 Base) MCG/ACT inhaler Commonly known as: VENTOLIN HFA Inhale 2 puffs into the lungs 4 (four) times daily as needed for wheezing or shortness of breath.   Alcohol Wipes 70 % Pads To use before checking blood sugars   allopurinol 100 MG tablet Commonly known as: ZYLOPRIM TAKE 2 TABLETS ONE TIME DAILY What changed: See the new instructions.   b complex vitamins tablet Take 1 tablet by mouth daily.   blood glucose meter kit and supplies Check blood sugars once weekly.   budesonide-formoterol 160-4.5 MCG/ACT inhaler Commonly known as: Symbicort Inhale 2 puffs into the lungs 2 (two) times daily.   cholecalciferol 1000 units tablet Commonly known as: VITAMIN D Take 1,000 Units daily by mouth.   docusate sodium 100 MG capsule Commonly known as: COLACE Take 1 capsule (100 mg total) by mouth 2 (two) times daily.   Ferrous Fumarate-Folic Acid 650-3 MG Tabs Commonly known as: Hemocyte-F Take 1 tablet by mouth daily.   fluticasone 50 MCG/ACT nasal spray Commonly known as: FLONASE  Place 1 spray into both nostrils daily. What changed:   when to take this  reasons to take this   furosemide 40 MG tablet Commonly known as: LASIX TAKE 1 TABLET DAILY AND A SECOND TABLET DAILY AS NEEDED FOR INCREASED EDEMA OR WEIGHT GAIN GREATER THAN 3 POUNDS IN 24 HOURS What changed:   how much to take  how to take this  when to take this  additional instructions   lidocaine 5 % Commonly known as: LIDODERM Place 2 patches onto the skin daily. Remove & Discard patch within 12 hours or as directed by MD   loratadine 10 MG tablet Commonly known as:  CLARITIN Take 1 tablet (10 mg total) by mouth at bedtime. What changed:   when to take this  reasons to take this   methocarbamol 750 MG tablet Commonly known as: ROBAXIN Take 1 tablet (750 mg total) by mouth 4 (four) times daily.   metoprolol succinate 50 MG 24 hr tablet Commonly known as: TOPROL-XL TAKE 1 AND 1/2 TABLETS EVERY DAY What changed: See the new instructions.   montelukast 10 MG tablet Commonly known as: SINGULAIR TAKE 1 TABLET AT BEDTIME   OXYGEN Inhale 2 L into the lungs continuous.   pantoprazole 40 MG tablet Commonly known as: PROTONIX Take 40 mg by mouth 2 (two) times daily.   polyethylene glycol 17 g packet Commonly known as: MIRALAX / GLYCOLAX Take 17 g by mouth daily.   pravastatin 40 MG tablet Commonly known as: PRAVACHOL Take 1 tablet (40 mg total) by mouth every evening.   PROBIOTIC PO Take 1 tablet by mouth daily.   sertraline 100 MG tablet Commonly known as: ZOLOFT TAKE 1 TABLET EVERY DAY   traMADol 50 MG tablet Commonly known as: ULTRAM TAKE 1 TABLET BY MOUTH THREE TIMES DAILY AS NEEDED FOR MODERATE PAIN   trolamine salicylate 10 % cream Commonly known as: ASPERCREME Apply 1 application topically as needed for muscle pain.   vitamin B-12 1000 MCG tablet Commonly known as: CYANOCOBALAMIN Take 1,000 mcg by mouth daily.       Allergies:  Allergies  Allergen Reactions  . Montelukast Sodium Other (See Comments)    REACTION: HEART PALPITATIONS, CHEST PAIN.  Marland Kitchen Sulfa Antibiotics Other (See Comments)    CHEST PAIN  . Sulfonamide Derivatives Other (See Comments)    CHEST PAIN  . Oseltamivir Phosphate Diarrhea, Nausea And Vomiting and Other (See Comments)    TAMIFLU REACTION:  dizziness  . Ace Inhibitors Other (See Comments)    PT. STATED UNKNOWN REACTION  . Indomethacin Other (See Comments)    PT. STATED UNKNOWN REACTION    Past Medical History, Surgical history, Social history, and Family History were reviewed and  updated.  Review of Systems: All other 10 point review of systems is negative.   Physical Exam:  vitals were not taken for this visit.   Wt Readings from Last 3 Encounters:  06/16/19 183 lb 9.6 oz (83.3 kg)  05/30/19 180 lb (81.6 kg)  05/23/19 184 lb (83.5 kg)    Ocular: Sclerae unicteric, pupils equal, round and reactive to light Ear-nose-throat: Oropharynx clear, dentition fair Lymphatic: No cervical or supraclavicular adenopathy Lungs no rales or rhonchi, good excursion bilaterally Heart regular rate and rhythm, no murmur appreciated Abd soft, nontender, positive bowel sounds, no liver or spleen tip palpated on exam, no fluid wave  MSK no focal spinal tenderness, no joint edema Neuro: non-focal, well-oriented, appropriate affect Breasts: Deferred   Lab Results  Component  Value Date   WBC 7.4 06/23/2019   HGB 9.5 (L) 06/23/2019   HCT 31.2 (L) 06/23/2019   MCV 101.0 (H) 06/23/2019   PLT 147 (L) 06/23/2019   Lab Results  Component Value Date   FERRITIN 740 (H) 05/23/2019   IRON 40 (L) 05/23/2019   TIBC 220 (L) 05/23/2019   UIBC 180 05/23/2019   IRONPCTSAT 18 (L) 05/23/2019   Lab Results  Component Value Date   RETICCTPCT 1.8 06/23/2019   RBC 3.10 (L) 06/23/2019   RBC 3.09 (L) 06/23/2019   No results found for: KPAFRELGTCHN, LAMBDASER, KAPLAMBRATIO No results found for: IGGSERUM, IGA, IGMSERUM No results found for: Odetta Pink, SPEI   Chemistry      Component Value Date/Time   NA 141 05/23/2019 1504   NA 143 07/21/2017 1501   K 5.2 (H) 05/23/2019 1504   CL 98 05/23/2019 1504   CO2 33 (H) 05/23/2019 1504   BUN 40 (H) 05/23/2019 1504   BUN 41 (H) 07/21/2017 1501   CREATININE 1.81 (H) 05/23/2019 1504   CREATININE 1.36 (H) 08/23/2013 1134      Component Value Date/Time   CALCIUM 10.3 05/23/2019 1504   ALKPHOS 67 05/23/2019 1504   AST 11 (L) 05/23/2019 1504   ALT 8 05/23/2019 1504   BILITOT 0.6  05/23/2019 1504       Impression and Plan: Ms. Cunningham is a very pleasant 79 yo caucasian female with multifactorial anemia. She received Retacrit today for Hgb 9.5.  Iron studies are pending. We will replace if needed.  We will see her back in another 3 weeks for lab and injections and 6 weeks for follow-up.  She will contact our office with nay questions or concerns. We can certainly see her sooner if needed.  Laverna Peace, NP 6/3/20212:14 PM

## 2019-06-23 NOTE — Patient Instructions (Signed)

## 2019-06-24 LAB — IRON AND TIBC
Iron: 84 ug/dL (ref 41–142)
Saturation Ratios: 31 % (ref 21–57)
TIBC: 271 ug/dL (ref 236–444)
UIBC: 187 ug/dL (ref 120–384)

## 2019-06-24 LAB — FERRITIN: Ferritin: 606 ng/mL — ABNORMAL HIGH (ref 11–307)

## 2019-06-29 DIAGNOSIS — J449 Chronic obstructive pulmonary disease, unspecified: Secondary | ICD-10-CM | POA: Diagnosis not present

## 2019-06-29 DIAGNOSIS — S2242XS Multiple fractures of ribs, left side, sequela: Secondary | ICD-10-CM | POA: Diagnosis not present

## 2019-06-29 DIAGNOSIS — I5042 Chronic combined systolic (congestive) and diastolic (congestive) heart failure: Secondary | ICD-10-CM | POA: Diagnosis not present

## 2019-06-30 ENCOUNTER — Other Ambulatory Visit: Payer: Self-pay | Admitting: Internal Medicine

## 2019-07-01 ENCOUNTER — Telehealth: Payer: Self-pay | Admitting: *Deleted

## 2019-07-01 MED ORDER — PRAVASTATIN SODIUM 40 MG PO TABS: 40.0000 mg | ORAL_TABLET | Freq: Every evening | ORAL | 1 refills | Status: DC

## 2019-07-01 NOTE — Telephone Encounter (Signed)
Received fax from [facility] requesting pravastatin refill. Refill sent.

## 2019-07-06 ENCOUNTER — Other Ambulatory Visit: Payer: Self-pay

## 2019-07-06 ENCOUNTER — Other Ambulatory Visit (INDEPENDENT_AMBULATORY_CARE_PROVIDER_SITE_OTHER): Payer: Medicare HMO

## 2019-07-06 ENCOUNTER — Other Ambulatory Visit: Payer: Self-pay | Admitting: *Deleted

## 2019-07-06 DIAGNOSIS — R7989 Other specified abnormal findings of blood chemistry: Secondary | ICD-10-CM

## 2019-07-06 DIAGNOSIS — I10 Essential (primary) hypertension: Secondary | ICD-10-CM | POA: Diagnosis not present

## 2019-07-06 DIAGNOSIS — E782 Mixed hyperlipidemia: Secondary | ICD-10-CM | POA: Diagnosis not present

## 2019-07-06 DIAGNOSIS — E118 Type 2 diabetes mellitus with unspecified complications: Secondary | ICD-10-CM | POA: Diagnosis not present

## 2019-07-06 LAB — COMPREHENSIVE METABOLIC PANEL
ALT: 7 U/L (ref 0–35)
AST: 12 U/L (ref 0–37)
Albumin: 4.1 g/dL (ref 3.5–5.2)
Alkaline Phosphatase: 54 U/L (ref 39–117)
BUN: 46 mg/dL — ABNORMAL HIGH (ref 6–23)
CO2: 32 mEq/L (ref 19–32)
Calcium: 9.2 mg/dL (ref 8.4–10.5)
Chloride: 100 mEq/L (ref 96–112)
Creatinine, Ser: 1.91 mg/dL — ABNORMAL HIGH (ref 0.40–1.20)
GFR: 25.4 mL/min — ABNORMAL LOW (ref >60.00)
Glucose, Bld: 96 mg/dL (ref 70–99)
Potassium: 5 mEq/L (ref 3.5–5.1)
Sodium: 140 mEq/L (ref 135–145)
Total Bilirubin: 0.6 mg/dL (ref 0.2–1.2)
Total Protein: 6.4 g/dL (ref 6.0–8.3)

## 2019-07-06 LAB — LIPID PANEL
Cholesterol: 134 mg/dL (ref 0–200)
HDL: 48.1 mg/dL (ref >39.00)
LDL Cholesterol: 69 mg/dL (ref 0–99)
NonHDL: 85.98
Total CHOL/HDL Ratio: 3
Triglycerides: 83 mg/dL (ref 0.0–149.0)
VLDL: 16.6 mg/dL (ref 0.0–40.0)

## 2019-07-06 LAB — CBC
HCT: 30.5 % — ABNORMAL LOW (ref 36.0–46.0)
Hemoglobin: 9.9 g/dL — ABNORMAL LOW (ref 12.0–15.0)
MCHC: 32.4 g/dL (ref 30.0–36.0)
MCV: 97.4 fl (ref 78.0–100.0)
Platelets: 118 10*3/uL — ABNORMAL LOW (ref 150.0–400.0)
RBC: 3.13 Mil/uL — ABNORMAL LOW (ref 3.87–5.11)
RDW: 17.1 % — ABNORMAL HIGH (ref 11.5–15.5)
WBC: 4.7 10*3/uL (ref 4.0–10.5)

## 2019-07-06 LAB — HEMOGLOBIN A1C: Hgb A1c MFr Bld: 5.9 % (ref 4.6–6.5)

## 2019-07-06 LAB — TSH: TSH: 4.79 u[IU]/mL — ABNORMAL HIGH (ref 0.35–4.50)

## 2019-07-06 MED ORDER — METOPROLOL SUCCINATE ER 50 MG PO TB24: 75.0000 mg | ORAL_TABLET | Freq: Every day | ORAL | 3 refills | Status: DC

## 2019-07-07 ENCOUNTER — Other Ambulatory Visit (INDEPENDENT_AMBULATORY_CARE_PROVIDER_SITE_OTHER): Payer: Medicare HMO

## 2019-07-07 DIAGNOSIS — R7989 Other specified abnormal findings of blood chemistry: Secondary | ICD-10-CM | POA: Diagnosis not present

## 2019-07-07 LAB — T4, FREE: Free T4: 1.07 ng/dL (ref 0.60–1.60)

## 2019-07-08 LAB — INTRINSIC FACTOR ANTIBODIES: Intrinsic Factor: NEGATIVE

## 2019-07-14 ENCOUNTER — Other Ambulatory Visit: Payer: Self-pay

## 2019-07-14 ENCOUNTER — Inpatient Hospital Stay: Payer: Medicare HMO

## 2019-07-14 VITALS — BP 103/38 | HR 70 | Temp 96.0°F | Resp 17

## 2019-07-14 DIAGNOSIS — D631 Anemia in chronic kidney disease: Secondary | ICD-10-CM

## 2019-07-14 DIAGNOSIS — N183 Chronic kidney disease, stage 3 unspecified: Secondary | ICD-10-CM | POA: Diagnosis not present

## 2019-07-14 DIAGNOSIS — Z79899 Other long term (current) drug therapy: Secondary | ICD-10-CM | POA: Diagnosis not present

## 2019-07-14 DIAGNOSIS — D509 Iron deficiency anemia, unspecified: Secondary | ICD-10-CM | POA: Diagnosis not present

## 2019-07-14 LAB — CMP (CANCER CENTER ONLY)
ALT: 7 U/L (ref 0–44)
AST: 11 U/L — ABNORMAL LOW (ref 15–41)
Albumin: 4.1 g/dL (ref 3.5–5.0)
Alkaline Phosphatase: 48 U/L (ref 38–126)
Anion gap: 6 (ref 5–15)
BUN: 57 mg/dL — ABNORMAL HIGH (ref 8–23)
CO2: 36 mmol/L — ABNORMAL HIGH (ref 22–32)
Calcium: 11 mg/dL — ABNORMAL HIGH (ref 8.9–10.3)
Chloride: 98 mmol/L (ref 98–111)
Creatinine: 2.07 mg/dL — ABNORMAL HIGH (ref 0.44–1.00)
GFR, Est AFR Am: 26 mL/min — ABNORMAL LOW (ref >60)
GFR, Estimated: 22 mL/min — ABNORMAL LOW (ref >60)
Glucose, Bld: 156 mg/dL — ABNORMAL HIGH (ref 70–99)
Potassium: 5 mmol/L (ref 3.5–5.1)
Sodium: 140 mmol/L (ref 135–145)
Total Bilirubin: 0.5 mg/dL (ref 0.3–1.2)
Total Protein: 6.9 g/dL (ref 6.5–8.1)

## 2019-07-14 LAB — CBC WITH DIFFERENTIAL (CANCER CENTER ONLY)
Abs Immature Granulocytes: 0.02 10*3/uL (ref 0.00–0.07)
Basophils Absolute: 0 10*3/uL (ref 0.0–0.1)
Basophils Relative: 1 %
Eosinophils Absolute: 0.4 10*3/uL (ref 0.0–0.5)
Eosinophils Relative: 6 %
HCT: 32.8 % — ABNORMAL LOW (ref 36.0–46.0)
Hemoglobin: 9.9 g/dL — ABNORMAL LOW (ref 12.0–15.0)
Immature Granulocytes: 0 %
Lymphocytes Relative: 18 %
Lymphs Abs: 1.1 10*3/uL (ref 0.7–4.0)
MCH: 30.7 pg (ref 26.0–34.0)
MCHC: 30.2 g/dL (ref 30.0–36.0)
MCV: 101.9 fL — ABNORMAL HIGH (ref 80.0–100.0)
Monocytes Absolute: 0.7 10*3/uL (ref 0.1–1.0)
Monocytes Relative: 11 %
Neutro Abs: 3.9 10*3/uL (ref 1.7–7.7)
Neutrophils Relative %: 64 %
Platelet Count: 138 10*3/uL — ABNORMAL LOW (ref 150–400)
RBC: 3.22 MIL/uL — ABNORMAL LOW (ref 3.87–5.11)
RDW: 14.6 % (ref 11.5–15.5)
WBC Count: 6 10*3/uL (ref 4.0–10.5)
nRBC: 0 % (ref 0.0–0.2)

## 2019-07-14 MED ORDER — EPOETIN ALFA-EPBX 40000 UNIT/ML IJ SOLN
40000.0000 [IU] | Freq: Once | INTRAMUSCULAR | Status: AC
  Administered 2019-07-14: 40000 [IU] via SUBCUTANEOUS

## 2019-07-14 MED ORDER — EPOETIN ALFA-EPBX 40000 UNIT/ML IJ SOLN
INTRAMUSCULAR | Status: AC
  Filled 2019-07-14: qty 1

## 2019-07-14 NOTE — Patient Instructions (Signed)

## 2019-07-15 ENCOUNTER — Other Ambulatory Visit: Payer: Self-pay | Admitting: Family Medicine

## 2019-07-15 DIAGNOSIS — R0902 Hypoxemia: Secondary | ICD-10-CM | POA: Diagnosis not present

## 2019-07-15 DIAGNOSIS — J4520 Mild intermittent asthma, uncomplicated: Secondary | ICD-10-CM | POA: Diagnosis not present

## 2019-07-18 NOTE — Telephone Encounter (Signed)
Last written: 06/06/19 Last ov: 06/16/19 Next ov: none Contract:    UDS: 12/23/16

## 2019-07-19 DIAGNOSIS — D631 Anemia in chronic kidney disease: Secondary | ICD-10-CM | POA: Diagnosis not present

## 2019-07-19 DIAGNOSIS — E1122 Type 2 diabetes mellitus with diabetic chronic kidney disease: Secondary | ICD-10-CM | POA: Diagnosis not present

## 2019-07-19 DIAGNOSIS — N184 Chronic kidney disease, stage 4 (severe): Secondary | ICD-10-CM | POA: Diagnosis not present

## 2019-07-19 DIAGNOSIS — I129 Hypertensive chronic kidney disease with stage 1 through stage 4 chronic kidney disease, or unspecified chronic kidney disease: Secondary | ICD-10-CM | POA: Diagnosis not present

## 2019-07-27 DIAGNOSIS — E782 Mixed hyperlipidemia: Secondary | ICD-10-CM | POA: Diagnosis not present

## 2019-07-27 DIAGNOSIS — I739 Peripheral vascular disease, unspecified: Secondary | ICD-10-CM | POA: Diagnosis not present

## 2019-07-27 DIAGNOSIS — I10 Essential (primary) hypertension: Secondary | ICD-10-CM | POA: Diagnosis not present

## 2019-07-27 DIAGNOSIS — I5032 Chronic diastolic (congestive) heart failure: Secondary | ICD-10-CM | POA: Diagnosis not present

## 2019-07-27 DIAGNOSIS — R55 Syncope and collapse: Secondary | ICD-10-CM | POA: Diagnosis not present

## 2019-07-27 DIAGNOSIS — N184 Chronic kidney disease, stage 4 (severe): Secondary | ICD-10-CM | POA: Diagnosis not present

## 2019-07-28 LAB — ALT: ALT: 7 IU/L (ref 0–32)

## 2019-07-28 LAB — LIPID PANEL
Chol/HDL Ratio: 3.2 ratio (ref 0.0–4.4)
Cholesterol, Total: 151 mg/dL (ref 100–199)
HDL: 47 mg/dL (ref >39)
LDL Chol Calc (NIH): 75 mg/dL (ref 0–99)
Triglycerides: 168 mg/dL — ABNORMAL HIGH (ref 0–149)
VLDL Cholesterol Cal: 29 mg/dL (ref 5–40)

## 2019-07-29 DIAGNOSIS — J449 Chronic obstructive pulmonary disease, unspecified: Secondary | ICD-10-CM | POA: Diagnosis not present

## 2019-07-29 DIAGNOSIS — I5042 Chronic combined systolic (congestive) and diastolic (congestive) heart failure: Secondary | ICD-10-CM | POA: Diagnosis not present

## 2019-07-29 DIAGNOSIS — S2242XS Multiple fractures of ribs, left side, sequela: Secondary | ICD-10-CM | POA: Diagnosis not present

## 2019-08-04 ENCOUNTER — Inpatient Hospital Stay: Payer: Medicare HMO

## 2019-08-04 ENCOUNTER — Other Ambulatory Visit: Payer: Self-pay

## 2019-08-04 ENCOUNTER — Encounter: Payer: Self-pay | Admitting: Family

## 2019-08-04 ENCOUNTER — Inpatient Hospital Stay (HOSPITAL_BASED_OUTPATIENT_CLINIC_OR_DEPARTMENT_OTHER): Payer: Medicare HMO | Admitting: Family

## 2019-08-04 ENCOUNTER — Inpatient Hospital Stay: Payer: Medicare HMO | Attending: Family

## 2019-08-04 VITALS — BP 103/58 | HR 73 | Temp 98.7°F | Resp 20 | Ht 64.0 in | Wt 182.1 lb

## 2019-08-04 DIAGNOSIS — I129 Hypertensive chronic kidney disease with stage 1 through stage 4 chronic kidney disease, or unspecified chronic kidney disease: Secondary | ICD-10-CM | POA: Diagnosis not present

## 2019-08-04 DIAGNOSIS — N183 Chronic kidney disease, stage 3 unspecified: Secondary | ICD-10-CM | POA: Insufficient documentation

## 2019-08-04 DIAGNOSIS — D508 Other iron deficiency anemias: Secondary | ICD-10-CM | POA: Diagnosis not present

## 2019-08-04 DIAGNOSIS — Z79899 Other long term (current) drug therapy: Secondary | ICD-10-CM | POA: Diagnosis not present

## 2019-08-04 DIAGNOSIS — D631 Anemia in chronic kidney disease: Secondary | ICD-10-CM | POA: Diagnosis not present

## 2019-08-04 DIAGNOSIS — N184 Chronic kidney disease, stage 4 (severe): Secondary | ICD-10-CM | POA: Diagnosis not present

## 2019-08-04 LAB — CBC WITH DIFFERENTIAL (CANCER CENTER ONLY)
Abs Immature Granulocytes: 0.08 10*3/uL — ABNORMAL HIGH (ref 0.00–0.07)
Basophils Absolute: 0.1 10*3/uL (ref 0.0–0.1)
Basophils Relative: 1 %
Eosinophils Absolute: 0.4 10*3/uL (ref 0.0–0.5)
Eosinophils Relative: 6 %
HCT: 34.7 % — ABNORMAL LOW (ref 36.0–46.0)
Hemoglobin: 10.5 g/dL — ABNORMAL LOW (ref 12.0–15.0)
Immature Granulocytes: 1 %
Lymphocytes Relative: 16 %
Lymphs Abs: 1.1 10*3/uL (ref 0.7–4.0)
MCH: 30.4 pg (ref 26.0–34.0)
MCHC: 30.3 g/dL (ref 30.0–36.0)
MCV: 100.6 fL — ABNORMAL HIGH (ref 80.0–100.0)
Monocytes Absolute: 0.8 10*3/uL (ref 0.1–1.0)
Monocytes Relative: 11 %
Neutro Abs: 4.8 10*3/uL (ref 1.7–7.7)
Neutrophils Relative %: 65 %
Platelet Count: 129 10*3/uL — ABNORMAL LOW (ref 150–400)
RBC: 3.45 MIL/uL — ABNORMAL LOW (ref 3.87–5.11)
RDW: 14 % (ref 11.5–15.5)
WBC Count: 7.2 10*3/uL (ref 4.0–10.5)
nRBC: 0 % (ref 0.0–0.2)

## 2019-08-04 LAB — CMP (CANCER CENTER ONLY)
ALT: 9 U/L (ref 0–44)
AST: 13 U/L — ABNORMAL LOW (ref 15–41)
Albumin: 4.3 g/dL (ref 3.5–5.0)
Alkaline Phosphatase: 47 U/L (ref 38–126)
Anion gap: 7 (ref 5–15)
BUN: 37 mg/dL — ABNORMAL HIGH (ref 8–23)
CO2: 35 mmol/L — ABNORMAL HIGH (ref 22–32)
Calcium: 10.3 mg/dL (ref 8.9–10.3)
Chloride: 100 mmol/L (ref 98–111)
Creatinine: 1.74 mg/dL — ABNORMAL HIGH (ref 0.44–1.00)
GFR, Est AFR Am: 32 mL/min — ABNORMAL LOW (ref >60)
GFR, Estimated: 28 mL/min — ABNORMAL LOW (ref >60)
Glucose, Bld: 104 mg/dL — ABNORMAL HIGH (ref 70–99)
Potassium: 4.1 mmol/L (ref 3.5–5.1)
Sodium: 142 mmol/L (ref 135–145)
Total Bilirubin: 0.5 mg/dL (ref 0.3–1.2)
Total Protein: 6.5 g/dL (ref 6.5–8.1)

## 2019-08-04 LAB — RETICULOCYTES
Immature Retic Fract: 9.3 % (ref 2.3–15.9)
RBC.: 3.4 MIL/uL — ABNORMAL LOW (ref 3.87–5.11)
Retic Count, Absolute: 30.3 10*3/uL (ref 19.0–186.0)
Retic Ct Pct: 0.9 % (ref 0.4–3.1)

## 2019-08-04 MED ORDER — EPOETIN ALFA-EPBX 40000 UNIT/ML IJ SOLN
INTRAMUSCULAR | Status: AC
  Filled 2019-08-04: qty 1

## 2019-08-04 MED ORDER — EPOETIN ALFA-EPBX 40000 UNIT/ML IJ SOLN
40000.0000 [IU] | Freq: Once | INTRAMUSCULAR | Status: AC
  Administered 2019-08-04: 40000 [IU] via SUBCUTANEOUS

## 2019-08-04 NOTE — Patient Instructions (Signed)

## 2019-08-04 NOTE — Progress Notes (Signed)
Hematology and Oncology Follow Up Visit  [name] [id number] 11-Feb-1940 79 y.o. 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 She is doing well and has no complaints at this time.  Hgb is improved at 10.5, MCV 100.  She has not noted any bleeding. No bruising or petechiae.  She states that her SOB seems stable right now. She is on O2 2-3L [place] 24 hours a day.  No fever, chills, n/v, cough, rash, dizziness, chest pain, palpitations, abdominal pain or changes in bowel or bladder habits.  No tenderness, numbness or tingling in her extremities at this time.  The chronic swelling in her legs is stable/unchanged. Pedal pulses are 2+.  She has not had any falls or syncopal episodes. She is still using her walker when ambulating.  She has maintained a good appetite and is staying well hydrated. Her weight is stable.   ECOG Performance Status: 1 - Symptomatic but completely ambulatory  Medications:  Allergies as of 08/04/2019      Reactions   Montelukast Sodium Other (See Comments)   REACTION: HEART PALPITATIONS, CHEST PAIN.   Sulfa Antibiotics Other (See Comments)   CHEST PAIN   Sulfonamide Derivatives Other (See Comments)   CHEST PAIN   Oseltamivir Phosphate Diarrhea, Nausea And Vomiting, Other (See Comments)   TAMIFLU REACTION:  dizziness   Ace Inhibitors Other (See Comments)   PT. STATED UNKNOWN REACTION   Indomethacin Other (See Comments)   PT. STATED UNKNOWN REACTION      Medication List       Accurate as of August 04, 2019  1:38 PM. If you have any questions, ask your nurse or doctor.        Accu-Chek Aviva Plus test strip Generic drug: glucose blood   Accu-Chek Aviva Soln   Accu-Chek Softclix Lancets lancets   acetaminophen 500 MG tablet Commonly known as: TYLENOL Take  500 mg by mouth 2 (two) times daily.   albuterol 0.63 MG/3ML nebulizer solution Commonly known as: ACCUNEB Take 3 mLs (0.63 mg total) by nebulization every 6 (six) hours as needed for wheezing or shortness of breath.   albuterol 108 (90 Base) MCG/ACT inhaler Commonly known as: VENTOLIN HFA Inhale 2 puffs into the lungs 4 (four) times daily as needed for wheezing or shortness of breath.   Alcohol Wipes 70 % Pads To use before checking blood sugars   allopurinol 100 MG tablet Commonly known as: ZYLOPRIM TAKE 2 TABLETS ONE TIME DAILY What changed: See the new instructions.   b complex vitamins tablet Take 1 tablet by mouth daily.   blood glucose meter kit and supplies Check blood sugars once weekly.   budesonide-formoterol 160-4.5 MCG/ACT inhaler Commonly known as: Symbicort Inhale 2 puffs into the lungs 2 (two) times daily.   cholecalciferol 1000 units tablet Commonly known as: VITAMIN D Take 1,000 Units daily by mouth.   docusate sodium 100 MG capsule Commonly known as: COLACE Take 1 capsule (100 mg total) by mouth 2 (two) times daily.   Ferrous Fumarate-Folic Acid 539-7 MG Tabs Commonly known as: Hemocyte-F Take 1 tablet by mouth daily.   fluticasone 50 MCG/ACT nasal spray Commonly known as: FLONASE Place 1 spray into both nostrils daily. What changed:   when to take this  reasons to take this   furosemide 40  MG tablet Commonly known as: LASIX TAKE 1 TABLET DAILY AND A SECOND TABLET DAILY AS NEEDED FOR INCREASED EDEMA OR WEIGHT GAIN GREATER THAN 3 POUNDS IN 24 HOURS What changed:   how much to take  how to take this  when to take this  additional instructions   lidocaine 5 % Commonly known as: LIDODERM Place 2 patches onto the skin daily. Remove & Discard patch within 12 hours or as directed by MD   loratadine 10 MG tablet Commonly known as: CLARITIN Take 1 tablet (10 mg total) by mouth at bedtime. What changed:   when to take this  reasons to  take this   methocarbamol 750 MG tablet Commonly known as: ROBAXIN Take 1 tablet (750 mg total) by mouth 4 (four) times daily.   metoprolol succinate 50 MG 24 hr tablet Commonly known as: TOPROL-XL Take 1.5 tablets (75 mg total) by mouth daily. Take with or immediately following a meal.   montelukast 10 MG tablet Commonly known as: SINGULAIR Take 1 tablet (10 mg total) by mouth at bedtime.   OXYGEN Inhale 2 L into the lungs continuous.   pantoprazole 40 MG tablet Commonly known as: PROTONIX Take 40 mg by mouth 2 (two) times daily.   polyethylene glycol 17 g packet Commonly known as: MIRALAX / GLYCOLAX Take 17 g by mouth daily.   pravastatin 40 MG tablet Commonly known as: PRAVACHOL Take 1 tablet (40 mg total) by mouth every evening.   PROBIOTIC PO Take 1 tablet by mouth daily.   sertraline 100 MG tablet Commonly known as: ZOLOFT TAKE 1 TABLET EVERY DAY   traMADol 50 MG tablet Commonly known as: ULTRAM TAKE 1 TABLET BY MOUTH THREE TIMES DAILY AS NEEDED FOR MODERATE PAIN   trolamine salicylate 10 % cream Commonly known as: ASPERCREME Apply 1 application topically as needed for muscle pain.   vitamin B-12 1000 MCG tablet Commonly known as: CYANOCOBALAMIN Take 1,000 mcg by mouth daily.       Allergies:  Allergies  Allergen Reactions  . Montelukast Sodium Other (See Comments)    REACTION: HEART PALPITATIONS, CHEST PAIN.  Marland Kitchen Sulfa Antibiotics Other (See Comments)    CHEST PAIN  . Sulfonamide Derivatives Other (See Comments)    CHEST PAIN  . Oseltamivir Phosphate Diarrhea, Nausea And Vomiting and Other (See Comments)    TAMIFLU REACTION:  dizziness  . Ace Inhibitors Other (See Comments)    PT. STATED UNKNOWN REACTION  . Indomethacin Other (See Comments)    PT. STATED UNKNOWN REACTION    Past Medical History, Surgical history, Social history, and Family History were reviewed and updated.  Review of Systems: All other 10 point review of systems is  negative.   Physical Exam:  vitals were not taken for this visit.   Wt Readings from Last 3 Encounters:  06/23/19 184 lb 0.6 oz (83.5 kg)  06/16/19 183 lb 9.6 oz (83.3 kg)  05/30/19 180 lb (81.6 kg)    Ocular: Sclerae unicteric, pupils equal, round and reactive to light Ear-nose-throat: Oropharynx clear, dentition fair Lymphatic: No cervical or supraclavicular adenopathy Lungs no rales or rhonchi, good excursion bilaterally Heart regular rate and rhythm, no murmur appreciated Abd soft, nontender, positive bowel sounds, no liver or spleen tip palpated on exam, no fluid wave  MSK no focal spinal tenderness, no joint edema Neuro: non-focal, well-oriented, appropriate affect Breasts: Deferred   Lab Results  Component Value Date   WBC 6.0 07/14/2019   HGB 9.9 (L) 07/14/2019  HCT 32.8 (L) 07/14/2019   MCV 101.9 (H) 07/14/2019   PLT 138 (L) 07/14/2019   Lab Results  Component Value Date   FERRITIN 606 (H) 06/23/2019   IRON 84 06/23/2019   TIBC 271 06/23/2019   UIBC 187 06/23/2019   IRONPCTSAT 31 06/23/2019   Lab Results  Component Value Date   RETICCTPCT 1.8 06/23/2019   RBC 3.22 (L) 07/14/2019   No results found for: KPAFRELGTCHN, LAMBDASER, KAPLAMBRATIO No results found for: IGGSERUM, IGA, IGMSERUM No results found for: Kathrynn Ducking, MSPIKE, SPEI   Chemistry      Component Value Date/Time   NA 140 07/14/2019 1345   NA 143 07/21/2017 1501   K 5.0 07/14/2019 1345   CL 98 07/14/2019 1345   CO2 36 (H) 07/14/2019 1345   BUN 57 (H) 07/14/2019 1345   BUN 41 (H) 07/21/2017 1501   CREATININE 2.07 (H) 07/14/2019 1345   CREATININE 1.36 (H) 08/23/2013 1134      Component Value Date/Time   CALCIUM 11.0 (H) 07/14/2019 1345   ALKPHOS 48 07/14/2019 1345   AST 11 (L) 07/14/2019 1345   ALT 7 07/27/2019 1701   ALT 7 07/14/2019 1345   BILITOT 0.5 07/14/2019 1345       Impression and Plan: Ms. Gunnells is a very pleasant 79 yo  caucasian female with multifactorial anemia. She did received Retacrit today for Hgb 10.5.  Iron studies are pending. We will replace if needed.  We will see her back in another 3 weeks for lab and injection and 6 weeks for follow-up.  She will contact our office with nay questions or concerns. We can certainly see her sooner if needed.  Laverna Peace, NP 7/15/20211:38 PM

## 2019-08-05 LAB — IRON AND TIBC
Iron: 99 ug/dL (ref 41–142)
Saturation Ratios: 39 % (ref 21–57)
TIBC: 251 ug/dL (ref 236–444)
UIBC: 152 ug/dL (ref 120–384)

## 2019-08-05 LAB — FERRITIN: Ferritin: 401 ng/mL — ABNORMAL HIGH (ref 11–307)

## 2019-08-07 ENCOUNTER — Other Ambulatory Visit: Payer: Self-pay | Admitting: Family Medicine

## 2019-08-08 ENCOUNTER — Telehealth: Payer: Self-pay | Admitting: Family

## 2019-08-08 NOTE — Telephone Encounter (Signed)
Appointments scheduled calendar printed & mailed per 7/15 los

## 2019-08-14 DIAGNOSIS — R0902 Hypoxemia: Secondary | ICD-10-CM | POA: Diagnosis not present

## 2019-08-14 DIAGNOSIS — J4520 Mild intermittent asthma, uncomplicated: Secondary | ICD-10-CM | POA: Diagnosis not present

## 2019-08-15 ENCOUNTER — Other Ambulatory Visit: Payer: Self-pay | Admitting: Family Medicine

## 2019-08-23 ENCOUNTER — Other Ambulatory Visit: Payer: Self-pay | Admitting: Family Medicine

## 2019-08-25 ENCOUNTER — Other Ambulatory Visit: Payer: Self-pay | Admitting: Family Medicine

## 2019-08-25 ENCOUNTER — Inpatient Hospital Stay: Payer: Medicare HMO

## 2019-08-25 ENCOUNTER — Other Ambulatory Visit: Payer: Self-pay

## 2019-08-25 ENCOUNTER — Telehealth: Payer: Self-pay | Admitting: *Deleted

## 2019-08-25 ENCOUNTER — Other Ambulatory Visit: Payer: Self-pay | Admitting: Family

## 2019-08-25 ENCOUNTER — Inpatient Hospital Stay: Payer: Medicare HMO | Attending: Family

## 2019-08-25 VITALS — BP 138/67 | HR 67 | Temp 98.4°F | Resp 18

## 2019-08-25 DIAGNOSIS — N183 Chronic kidney disease, stage 3 unspecified: Secondary | ICD-10-CM | POA: Diagnosis not present

## 2019-08-25 DIAGNOSIS — H6982 Other specified disorders of Eustachian tube, left ear: Secondary | ICD-10-CM

## 2019-08-25 DIAGNOSIS — D631 Anemia in chronic kidney disease: Secondary | ICD-10-CM | POA: Diagnosis not present

## 2019-08-25 DIAGNOSIS — D508 Other iron deficiency anemias: Secondary | ICD-10-CM

## 2019-08-25 DIAGNOSIS — I129 Hypertensive chronic kidney disease with stage 1 through stage 4 chronic kidney disease, or unspecified chronic kidney disease: Secondary | ICD-10-CM | POA: Insufficient documentation

## 2019-08-25 DIAGNOSIS — Z79899 Other long term (current) drug therapy: Secondary | ICD-10-CM | POA: Diagnosis not present

## 2019-08-25 LAB — CMP (CANCER CENTER ONLY)
ALT: 12 U/L (ref 0–44)
AST: 16 U/L (ref 15–41)
Albumin: 4.1 g/dL (ref 3.5–5.0)
Alkaline Phosphatase: 42 U/L (ref 38–126)
Anion gap: 15 (ref 5–15)
BUN: 52 mg/dL — ABNORMAL HIGH (ref 8–23)
CO2: 33 mmol/L — ABNORMAL HIGH (ref 22–32)
Calcium: 10 mg/dL (ref 8.9–10.3)
Chloride: 96 mmol/L — ABNORMAL LOW (ref 98–111)
Creatinine: 1.88 mg/dL — ABNORMAL HIGH (ref 0.44–1.00)
GFR, Est AFR Am: 29 mL/min — ABNORMAL LOW (ref >60)
GFR, Estimated: 25 mL/min — ABNORMAL LOW (ref >60)
Glucose, Bld: 93 mg/dL (ref 70–99)
Potassium: 4.8 mmol/L (ref 3.5–5.1)
Sodium: 144 mmol/L (ref 135–145)
Total Bilirubin: 0.8 mg/dL (ref 0.3–1.2)
Total Protein: 7 g/dL (ref 6.5–8.1)

## 2019-08-25 LAB — CBC WITH DIFFERENTIAL (CANCER CENTER ONLY)
Abs Immature Granulocytes: 0.01 10*3/uL (ref 0.00–0.07)
Basophils Absolute: 0 10*3/uL (ref 0.0–0.1)
Basophils Relative: 1 %
Eosinophils Absolute: 0.4 10*3/uL (ref 0.0–0.5)
Eosinophils Relative: 8 %
HCT: 35 % — ABNORMAL LOW (ref 36.0–46.0)
Hemoglobin: 10.7 g/dL — ABNORMAL LOW (ref 12.0–15.0)
Immature Granulocytes: 0 %
Lymphocytes Relative: 22 %
Lymphs Abs: 1.1 10*3/uL (ref 0.7–4.0)
MCH: 30.5 pg (ref 26.0–34.0)
MCHC: 30.6 g/dL (ref 30.0–36.0)
MCV: 99.7 fL (ref 80.0–100.0)
Monocytes Absolute: 0.6 10*3/uL (ref 0.1–1.0)
Monocytes Relative: 12 %
Neutro Abs: 3 10*3/uL (ref 1.7–7.7)
Neutrophils Relative %: 57 %
Platelet Count: 115 10*3/uL — ABNORMAL LOW (ref 150–400)
RBC: 3.51 MIL/uL — ABNORMAL LOW (ref 3.87–5.11)
RDW: 14.5 % (ref 11.5–15.5)
WBC Count: 5.2 10*3/uL (ref 4.0–10.5)
nRBC: 0 % (ref 0.0–0.2)

## 2019-08-25 MED ORDER — TRAMADOL HCL 50 MG PO TABS: ORAL_TABLET | ORAL | 0 refills | Status: DC

## 2019-08-25 MED ORDER — FLUTICASONE PROPIONATE 50 MCG/ACT NA SUSP: 2.0000 | Freq: Every day | NASAL | 1 refills | Status: DC

## 2019-08-25 MED ORDER — EPOETIN ALFA-EPBX 40000 UNIT/ML IJ SOLN
40000.0000 [IU] | Freq: Once | INTRAMUSCULAR | Status: AC
  Administered 2019-08-25: 40000 [IU] via SUBCUTANEOUS

## 2019-08-25 NOTE — Patient Instructions (Signed)

## 2019-08-25 NOTE — Telephone Encounter (Signed)
Flonase sent in

## 2019-08-25 NOTE — Addendum Note (Signed)
Addended by: Kem Boroughs D on: 08/25/2019 03:04 PM   Modules accepted: Orders

## 2019-08-25 NOTE — Telephone Encounter (Signed)
[facility] faxed request for Flonase.  It was last filled by pulmonology in 2018.  Are you ok with refilling going forward?  Also needing a refill on Tramadol  Last written: 06/06/19   Last ov: 06/16/19 Next ov: none Contract:  UDS: 12/23/16

## 2019-08-25 NOTE — Telephone Encounter (Signed)
Yes please refill her Flonase with 5 rf and I will do tramadol

## 2019-08-29 DIAGNOSIS — J449 Chronic obstructive pulmonary disease, unspecified: Secondary | ICD-10-CM | POA: Diagnosis not present

## 2019-08-29 DIAGNOSIS — S2242XS Multiple fractures of ribs, left side, sequela: Secondary | ICD-10-CM | POA: Diagnosis not present

## 2019-08-29 DIAGNOSIS — I5042 Chronic combined systolic (congestive) and diastolic (congestive) heart failure: Secondary | ICD-10-CM | POA: Diagnosis not present

## 2019-09-12 ENCOUNTER — Other Ambulatory Visit: Payer: Self-pay | Admitting: Family Medicine

## 2019-09-12 MED ORDER — ALBUTEROL SULFATE 0.63 MG/3ML IN NEBU: 1.0000 | INHALATION_SOLUTION | Freq: Four times a day (QID) | RESPIRATORY_TRACT | 3 refills | Status: DC | PRN

## 2019-09-12 NOTE — Telephone Encounter (Signed)
Medication: albuterol (ACCUNEB) 0.63 MG/3ML nebulizer solution   Has the patient contacted their pharmacy? No. (If no, request that the patient contact the pharmacy for the refill.) (If yes, when and what did the pharmacy advise?)  Preferred Pharmacy (with phone number or street name): [place], [address], [place] [address]  Phone:  [phone] Fax:  [phone]  DEA #:  --  Agent: Please be advised that RX refills may take up to 3 business days. We ask that you follow-up with your pharmacy.

## 2019-09-12 NOTE — Telephone Encounter (Signed)
Medication refilled

## 2019-09-14 ENCOUNTER — Other Ambulatory Visit: Payer: Self-pay | Admitting: Family Medicine

## 2019-09-14 ENCOUNTER — Telehealth: Payer: Self-pay | Admitting: Family Medicine

## 2019-09-14 DIAGNOSIS — J4520 Mild intermittent asthma, uncomplicated: Secondary | ICD-10-CM | POA: Diagnosis not present

## 2019-09-14 DIAGNOSIS — R0902 Hypoxemia: Secondary | ICD-10-CM | POA: Diagnosis not present

## 2019-09-14 MED ORDER — BENZONATATE 100 MG PO CAPS
100.0000 mg | ORAL_CAPSULE | Freq: Two times a day (BID) | ORAL | 1 refills | Status: DC | PRN
Start: 2019-09-14 — End: 2020-04-02

## 2019-09-14 NOTE — Telephone Encounter (Signed)
I did send her in 20 benzonatate but please find out more about what her current condition is.

## 2019-09-14 NOTE — Telephone Encounter (Signed)
Left message on machine to call back with little more information.  Last filled last January.

## 2019-09-14 NOTE — Telephone Encounter (Signed)
New Message:   Pt is calling and states she needs a new prescription sent for Benzonatate 100mg  sent to [facility] [place] [facility] [address]. Please advise.

## 2019-09-15 ENCOUNTER — Encounter: Payer: Self-pay | Admitting: Family

## 2019-09-15 ENCOUNTER — Inpatient Hospital Stay (HOSPITAL_BASED_OUTPATIENT_CLINIC_OR_DEPARTMENT_OTHER): Payer: Medicare HMO | Admitting: Family

## 2019-09-15 ENCOUNTER — Inpatient Hospital Stay: Payer: Medicare HMO

## 2019-09-15 ENCOUNTER — Telehealth: Payer: Self-pay | Admitting: Family

## 2019-09-15 ENCOUNTER — Other Ambulatory Visit: Payer: Self-pay

## 2019-09-15 VITALS — BP 112/60 | HR 65 | Temp 98.9°F | Resp 19 | Ht 64.0 in | Wt 182.1 lb

## 2019-09-15 DIAGNOSIS — D631 Anemia in chronic kidney disease: Secondary | ICD-10-CM

## 2019-09-15 DIAGNOSIS — N184 Chronic kidney disease, stage 4 (severe): Secondary | ICD-10-CM | POA: Diagnosis not present

## 2019-09-15 DIAGNOSIS — Z79899 Other long term (current) drug therapy: Secondary | ICD-10-CM | POA: Diagnosis not present

## 2019-09-15 DIAGNOSIS — D508 Other iron deficiency anemias: Secondary | ICD-10-CM

## 2019-09-15 DIAGNOSIS — I129 Hypertensive chronic kidney disease with stage 1 through stage 4 chronic kidney disease, or unspecified chronic kidney disease: Secondary | ICD-10-CM | POA: Diagnosis not present

## 2019-09-15 DIAGNOSIS — N183 Chronic kidney disease, stage 3 unspecified: Secondary | ICD-10-CM | POA: Diagnosis not present

## 2019-09-15 LAB — CBC WITH DIFFERENTIAL (CANCER CENTER ONLY)
Abs Immature Granulocytes: 0.06 10*3/uL (ref 0.00–0.07)
Basophils Absolute: 0 10*3/uL (ref 0.0–0.1)
Basophils Relative: 0 %
Eosinophils Absolute: 0.3 10*3/uL (ref 0.0–0.5)
Eosinophils Relative: 6 %
HCT: 35.4 % — ABNORMAL LOW (ref 36.0–46.0)
Hemoglobin: 10.9 g/dL — ABNORMAL LOW (ref 12.0–15.0)
Immature Granulocytes: 1 %
Lymphocytes Relative: 17 %
Lymphs Abs: 0.9 10*3/uL (ref 0.7–4.0)
MCH: 30.2 pg (ref 26.0–34.0)
MCHC: 30.8 g/dL (ref 30.0–36.0)
MCV: 98.1 fL (ref 80.0–100.0)
Monocytes Absolute: 0.5 10*3/uL (ref 0.1–1.0)
Monocytes Relative: 10 %
Neutro Abs: 3.5 10*3/uL (ref 1.7–7.7)
Neutrophils Relative %: 66 %
Platelet Count: 116 10*3/uL — ABNORMAL LOW (ref 150–400)
RBC: 3.61 MIL/uL — ABNORMAL LOW (ref 3.87–5.11)
RDW: 14.5 % (ref 11.5–15.5)
WBC Count: 5.3 10*3/uL (ref 4.0–10.5)
nRBC: 0 % (ref 0.0–0.2)

## 2019-09-15 LAB — RETICULOCYTES
Immature Retic Fract: 7.6 % (ref 2.3–15.9)
RBC.: 3.57 MIL/uL — ABNORMAL LOW (ref 3.87–5.11)
Retic Count, Absolute: 30 10*3/uL (ref 19.0–186.0)
Retic Ct Pct: 0.8 % (ref 0.4–3.1)

## 2019-09-15 LAB — CMP (CANCER CENTER ONLY)
ALT: 9 U/L (ref 0–44)
AST: 12 U/L — ABNORMAL LOW (ref 15–41)
Albumin: 4.3 g/dL (ref 3.5–5.0)
Alkaline Phosphatase: 43 U/L (ref 38–126)
Anion gap: 7 (ref 5–15)
BUN: 42 mg/dL — ABNORMAL HIGH (ref 8–23)
CO2: 33 mmol/L — ABNORMAL HIGH (ref 22–32)
Calcium: 9.9 mg/dL (ref 8.9–10.3)
Chloride: 100 mmol/L (ref 98–111)
Creatinine: 1.57 mg/dL — ABNORMAL HIGH (ref 0.44–1.00)
GFR, Est AFR Am: 36 mL/min — ABNORMAL LOW (ref >60)
GFR, Estimated: 31 mL/min — ABNORMAL LOW (ref >60)
Glucose, Bld: 131 mg/dL — ABNORMAL HIGH (ref 70–99)
Potassium: 4.3 mmol/L (ref 3.5–5.1)
Sodium: 140 mmol/L (ref 135–145)
Total Bilirubin: 0.5 mg/dL (ref 0.3–1.2)
Total Protein: 6.8 g/dL (ref 6.5–8.1)

## 2019-09-15 MED ORDER — EPOETIN ALFA-EPBX 40000 UNIT/ML IJ SOLN
INTRAMUSCULAR | Status: AC
  Filled 2019-09-15: qty 1

## 2019-09-15 MED ORDER — EPOETIN ALFA-EPBX 40000 UNIT/ML IJ SOLN
40000.0000 [IU] | Freq: Once | INTRAMUSCULAR | Status: AC
  Administered 2019-09-15: 40000 [IU] via SUBCUTANEOUS

## 2019-09-15 NOTE — Telephone Encounter (Signed)
Left message on machine to call back with her current condition.

## 2019-09-15 NOTE — Progress Notes (Signed)
Hematology and Oncology Follow Up Visit  [name] [id number] 09/16/1940 79 y.o. 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 She is doing well and states that her SOB is stable on 2L [place] supplemental O2 24 hours a day.  Hgb is 10.9, MCV 98, platelets 116 and WBC count 5.3.  No fever, chills, n/v, cough, rash, dizziness, SOB, chest pain, palpitations, abdominal pain or changes in bowel or bladder habits.  No episodes of bleeding. No petechiae. She bruises easily but not in excess.  The swelling in her legs is much improved. Pedal pulses are 2+. No numbness or tingling in her extremities at this time.  She had a fall a couple weeks ago which she states was caused by her not picking her feet and not having her walker. Thankfully she was not injured. She is using her walker now for added support. No syncope.  She states that she has a good appetite and is staying well hydrated. Her weight is stable.   ECOG Performance Status: 1 - Symptomatic but completely ambulatory  Medications:  Allergies as of 09/15/2019      Reactions   Montelukast Sodium Palpitations, Other (See Comments)   REACTION: HEART PALPITATIONS, CHEST PAIN.   Sulfa Antibiotics Other (See Comments)   CHEST PAIN   Sulfonamide Derivatives Other (See Comments)   CHEST PAIN   Oseltamivir Phosphate Diarrhea, Nausea And Vomiting, Other (See Comments)   TAMIFLU REACTION:  dizziness   Ace Inhibitors Other (See Comments)   PT. STATED UNKNOWN REACTION   Indomethacin Other (See Comments)   PT. STATED UNKNOWN REACTION      Medication List       Accurate as of September 15, 2019  2:01 PM. If you have any questions, ask your nurse or doctor.        STOP taking these medications   cholecalciferol  1000 units tablet Commonly known as: VITAMIN D Stopped by: Emeline Gins, NP     TAKE these medications   Accu-Chek Aviva Plus test strip Generic drug: glucose blood CHECK BLOOD SUGAR ONE TIME WEEKLY   Accu-Chek Aviva Soln   Accu-Chek Softclix Lancets lancets CHECK BLOOD SUGAR ONE TIME WEEKLY   acetaminophen 500 MG tablet Commonly known as: TYLENOL Take 500 mg by mouth 2 (two) times daily.   albuterol 108 (90 Base) MCG/ACT inhaler Commonly known as: VENTOLIN HFA Inhale 2 puffs into the lungs every 4 (four) hours as needed for wheezing or shortness of breath.   albuterol 0.63 MG/3ML nebulizer solution Commonly known as: ACCUNEB Take 3 mLs (0.63 mg total) by nebulization every 6 (six) hours as needed for wheezing or shortness of breath.   Alcohol Wipes 70 % Pads To use before checking blood sugars   allopurinol 100 MG tablet Commonly known as: ZYLOPRIM TAKE 2 TABLETS ONE TIME DAILY   b complex vitamins tablet Take 1 tablet by mouth daily.   benzonatate 100 MG capsule Commonly known as: TESSALON Take 1 capsule (100 mg total) by mouth 2 (two) times daily as needed for cough.   blood glucose meter kit and supplies Check blood sugars once weekly.   budesonide-formoterol 160-4.5 MCG/ACT inhaler Commonly known as: Symbicort Inhale 2 puffs into the lungs in the morning and at bedtime.   docusate sodium  100 MG capsule Commonly known as: COLACE Take 1 capsule (100 mg total) by mouth 2 (two) times daily.   Ferrous Fumarate-Folic Acid 710-6 MG Tabs Commonly known as: Hemocyte-F Take 1 tablet by mouth daily.   fluticasone 50 MCG/ACT nasal spray Commonly known as: FLONASE Place 2 sprays into both nostrils daily.   furosemide 40 MG tablet Commonly known as: LASIX TAKE 1 TABLET DAILY AND A SECOND TABLET DAILY AS NEEDED FOR INCREASED EDEMA OR WEIGHT GAIN GREATER THAN 3 POUNDS IN 24 HOURS What changed:   how much to take  how to take this  when to take  this  additional instructions   lidocaine 5 % Commonly known as: LIDODERM Place 2 patches onto the skin daily. Remove & Discard patch within 12 hours or as directed by MD   loratadine 10 MG tablet Commonly known as: CLARITIN Take 1 tablet (10 mg total) by mouth at bedtime. What changed:   when to take this  reasons to take this   methocarbamol 750 MG tablet Commonly known as: ROBAXIN Take 1 tablet (750 mg total) by mouth 4 (four) times daily.   metoprolol succinate 50 MG 24 hr tablet Commonly known as: TOPROL-XL Take 1.5 tablets (75 mg total) by mouth daily. Take with or immediately following a meal.   montelukast 10 MG tablet Commonly known as: SINGULAIR Take 1 tablet (10 mg total) by mouth at bedtime.   OXYGEN Inhale 2 L into the lungs continuous.   pantoprazole 40 MG tablet Commonly known as: PROTONIX Take 40 mg by mouth 2 (two) times daily.   polyethylene glycol 17 g packet Commonly known as: MIRALAX / GLYCOLAX Take 17 g by mouth daily.   pravastatin 40 MG tablet Commonly known as: PRAVACHOL Take 1 tablet (40 mg total) by mouth every evening.   PROBIOTIC PO Take 1 tablet by mouth daily.   sertraline 100 MG tablet Commonly known as: ZOLOFT Take 1 tablet (100 mg total) by mouth daily.   traMADol 50 MG tablet Commonly known as: ULTRAM TAKE 1 TABLET BY MOUTH THREE TIMES DAILY AS NEEDED FOR MODERATE PAIN   trolamine salicylate 10 % cream Commonly known as: ASPERCREME Apply 1 application topically as needed for muscle pain.   vitamin B-12 1000 MCG tablet Commonly known as: CYANOCOBALAMIN Take 1,000 mcg by mouth daily.       Allergies:  Allergies  Allergen Reactions  . Montelukast Sodium Palpitations and Other (See Comments)    REACTION: HEART PALPITATIONS, CHEST PAIN.  Marland Kitchen Sulfa Antibiotics Other (See Comments)    CHEST PAIN  . Sulfonamide Derivatives Other (See Comments)    CHEST PAIN  . Oseltamivir Phosphate Diarrhea, Nausea And Vomiting and  Other (See Comments)    TAMIFLU REACTION:  dizziness  . Ace Inhibitors Other (See Comments)    PT. STATED UNKNOWN REACTION  . Indomethacin Other (See Comments)    PT. STATED UNKNOWN REACTION    Past Medical History, Surgical history, Social history, and Family History were reviewed and updated.  Review of Systems: All other 10 point review of systems is negative.   Physical Exam:  height is $RemoveB'5\' 4"'znDameGL$  (1.626 m) and weight is 182 lb 1.9 oz (82.6 kg). Her oral temperature is 98.9 F (37.2 C). Her blood pressure is 112/60 and her pulse is 65. Her respiration is 19 and oxygen saturation is 100%.   Wt Readings from Last 3 Encounters:  09/15/19 182 lb 1.9 oz (82.6 kg)  08/04/19 182 lb 1.9 oz (82.6  kg)  06/23/19 184 lb 0.6 oz (83.5 kg)    Ocular: Sclerae unicteric, pupils equal, round and reactive to light Ear-nose-throat: Oropharynx clear, dentition fair Lymphatic: No cervical or supraclavicular adenopathy Lungs no rales or rhonchi, good excursion bilaterally Heart regular rate and rhythm, no murmur appreciated Abd soft, nontender, positive bowel sounds, no liver or spleen tip palpated on exam, no fluid wave  MSK no focal spinal tenderness, no joint edema Neuro: non-focal, well-oriented, appropriate affect Breasts: Deferred   Lab Results  Component Value Date   WBC 5.3 09/15/2019   HGB 10.9 (L) 09/15/2019   HCT 35.4 (L) 09/15/2019   MCV 98.1 09/15/2019   PLT 116 (L) 09/15/2019   Lab Results  Component Value Date   FERRITIN 401 (H) 08/04/2019   IRON 99 08/04/2019   TIBC 251 08/04/2019   UIBC 152 08/04/2019   IRONPCTSAT 39 08/04/2019   Lab Results  Component Value Date   RETICCTPCT 0.8 09/15/2019   RBC 3.61 (L) 09/15/2019   RBC 3.57 (L) 09/15/2019   No results found for: KPAFRELGTCHN, LAMBDASER, KAPLAMBRATIO No results found for: IGGSERUM, IGA, IGMSERUM No results found for: Kathrynn Ducking, MSPIKE, SPEI   Chemistry       Component Value Date/Time   NA 140 09/15/2019 1314   NA 143 07/21/2017 1501   K 4.3 09/15/2019 1314   CL 100 09/15/2019 1314   CO2 33 (H) 09/15/2019 1314   BUN 42 (H) 09/15/2019 1314   BUN 41 (H) 07/21/2017 1501   CREATININE 1.57 (H) 09/15/2019 1314   CREATININE 1.36 (H) 08/23/2013 1134      Component Value Date/Time   CALCIUM 9.9 09/15/2019 1314   ALKPHOS 43 09/15/2019 1314   AST 12 (L) 09/15/2019 1314   ALT 9 09/15/2019 1314   BILITOT 0.5 09/15/2019 1314       Impression and Plan: [name] is a very pleasant 79 yo caucasian female with multifactorial anemia. She received Retacrit for Hgb 10.9.  Iron studies are pending and we will replace if needed.  We will plan to see her again in 3 weeks for lab and injection and follow-up in 6 weeks.  She will contact our office with any questions or concerns. We can certainly see her sooner if needed.   Laverna Peace, NP 8/26/20212:01 PM

## 2019-09-15 NOTE — Telephone Encounter (Signed)
Appointments scheduled calendar printed & mailed per 8/26 los 

## 2019-09-16 LAB — IRON AND TIBC
Iron: 81 ug/dL (ref 41–142)
Saturation Ratios: 33 % (ref 21–57)
TIBC: 245 ug/dL (ref 236–444)
UIBC: 164 ug/dL (ref 120–384)

## 2019-09-16 LAB — FERRITIN: Ferritin: 333 ng/mL — ABNORMAL HIGH (ref 11–307)

## 2019-09-29 DIAGNOSIS — J449 Chronic obstructive pulmonary disease, unspecified: Secondary | ICD-10-CM | POA: Diagnosis not present

## 2019-09-29 DIAGNOSIS — S2242XS Multiple fractures of ribs, left side, sequela: Secondary | ICD-10-CM | POA: Diagnosis not present

## 2019-09-29 DIAGNOSIS — I5042 Chronic combined systolic (congestive) and diastolic (congestive) heart failure: Secondary | ICD-10-CM | POA: Diagnosis not present

## 2019-10-06 ENCOUNTER — Inpatient Hospital Stay: Payer: Medicare HMO | Attending: Family

## 2019-10-06 ENCOUNTER — Other Ambulatory Visit: Payer: Self-pay

## 2019-10-06 ENCOUNTER — Inpatient Hospital Stay: Payer: Medicare HMO

## 2019-10-06 DIAGNOSIS — N183 Chronic kidney disease, stage 3 unspecified: Secondary | ICD-10-CM | POA: Insufficient documentation

## 2019-10-06 DIAGNOSIS — D631 Anemia in chronic kidney disease: Secondary | ICD-10-CM | POA: Diagnosis not present

## 2019-10-06 LAB — CBC WITH DIFFERENTIAL (CANCER CENTER ONLY)
Abs Immature Granulocytes: 0.01 10*3/uL (ref 0.00–0.07)
Basophils Absolute: 0.1 10*3/uL (ref 0.0–0.1)
Basophils Relative: 1 %
Eosinophils Absolute: 0.3 10*3/uL (ref 0.0–0.5)
Eosinophils Relative: 6 %
HCT: 37.3 % (ref 36.0–46.0)
Hemoglobin: 11.6 g/dL — ABNORMAL LOW (ref 12.0–15.0)
Immature Granulocytes: 0 %
Lymphocytes Relative: 25 %
Lymphs Abs: 1.3 10*3/uL (ref 0.7–4.0)
MCH: 30.9 pg (ref 26.0–34.0)
MCHC: 31.1 g/dL (ref 30.0–36.0)
MCV: 99.2 fL (ref 80.0–100.0)
Monocytes Absolute: 0.6 10*3/uL (ref 0.1–1.0)
Monocytes Relative: 11 %
Neutro Abs: 3.2 10*3/uL (ref 1.7–7.7)
Neutrophils Relative %: 57 %
Platelet Count: 114 10*3/uL — ABNORMAL LOW (ref 150–400)
RBC: 3.76 MIL/uL — ABNORMAL LOW (ref 3.87–5.11)
RDW: 14.6 % (ref 11.5–15.5)
WBC Count: 5.5 10*3/uL (ref 4.0–10.5)
nRBC: 0 % (ref 0.0–0.2)

## 2019-10-06 LAB — CMP (CANCER CENTER ONLY)
ALT: 11 U/L (ref 0–44)
AST: 13 U/L — ABNORMAL LOW (ref 15–41)
Albumin: 4.2 g/dL (ref 3.5–5.0)
Alkaline Phosphatase: 39 U/L (ref 38–126)
Anion gap: 8 (ref 5–15)
BUN: 59 mg/dL — ABNORMAL HIGH (ref 8–23)
CO2: 36 mmol/L — ABNORMAL HIGH (ref 22–32)
Calcium: 10.5 mg/dL — ABNORMAL HIGH (ref 8.9–10.3)
Chloride: 98 mmol/L (ref 98–111)
Creatinine: 1.91 mg/dL — ABNORMAL HIGH (ref 0.44–1.00)
GFR, Est AFR Am: 29 mL/min — ABNORMAL LOW (ref >60)
GFR, Estimated: 25 mL/min — ABNORMAL LOW (ref >60)
Glucose, Bld: 138 mg/dL — ABNORMAL HIGH (ref 70–99)
Potassium: 4.3 mmol/L (ref 3.5–5.1)
Sodium: 142 mmol/L (ref 135–145)
Total Bilirubin: 0.5 mg/dL (ref 0.3–1.2)
Total Protein: 6.5 g/dL (ref 6.5–8.1)

## 2019-10-14 ENCOUNTER — Other Ambulatory Visit: Payer: Self-pay | Admitting: Family Medicine

## 2019-10-15 DIAGNOSIS — J4520 Mild intermittent asthma, uncomplicated: Secondary | ICD-10-CM | POA: Diagnosis not present

## 2019-10-15 DIAGNOSIS — R0902 Hypoxemia: Secondary | ICD-10-CM | POA: Diagnosis not present

## 2019-10-27 ENCOUNTER — Inpatient Hospital Stay (HOSPITAL_BASED_OUTPATIENT_CLINIC_OR_DEPARTMENT_OTHER): Payer: Medicare HMO | Admitting: Family

## 2019-10-27 ENCOUNTER — Telehealth: Payer: Self-pay | Admitting: Family

## 2019-10-27 ENCOUNTER — Other Ambulatory Visit: Payer: Self-pay

## 2019-10-27 ENCOUNTER — Inpatient Hospital Stay: Payer: Medicare HMO

## 2019-10-27 ENCOUNTER — Inpatient Hospital Stay: Payer: Medicare HMO | Attending: Family

## 2019-10-27 ENCOUNTER — Encounter: Payer: Self-pay | Admitting: Family

## 2019-10-27 VITALS — BP 114/70 | HR 70 | Temp 98.5°F | Resp 18 | Ht 64.0 in | Wt 181.4 lb

## 2019-10-27 DIAGNOSIS — D509 Iron deficiency anemia, unspecified: Secondary | ICD-10-CM | POA: Insufficient documentation

## 2019-10-27 DIAGNOSIS — D631 Anemia in chronic kidney disease: Secondary | ICD-10-CM | POA: Insufficient documentation

## 2019-10-27 DIAGNOSIS — N184 Chronic kidney disease, stage 4 (severe): Secondary | ICD-10-CM

## 2019-10-27 DIAGNOSIS — D508 Other iron deficiency anemias: Secondary | ICD-10-CM

## 2019-10-27 DIAGNOSIS — N183 Chronic kidney disease, stage 3 unspecified: Secondary | ICD-10-CM | POA: Insufficient documentation

## 2019-10-27 DIAGNOSIS — Z79899 Other long term (current) drug therapy: Secondary | ICD-10-CM | POA: Insufficient documentation

## 2019-10-27 LAB — CBC WITH DIFFERENTIAL (CANCER CENTER ONLY)
Abs Immature Granulocytes: 0.09 10*3/uL — ABNORMAL HIGH (ref 0.00–0.07)
Basophils Absolute: 0 10*3/uL (ref 0.0–0.1)
Basophils Relative: 0 %
Eosinophils Absolute: 0.3 10*3/uL (ref 0.0–0.5)
Eosinophils Relative: 5 %
HCT: 35.4 % — ABNORMAL LOW (ref 36.0–46.0)
Hemoglobin: 11.1 g/dL — ABNORMAL LOW (ref 12.0–15.0)
Immature Granulocytes: 1 %
Lymphocytes Relative: 18 %
Lymphs Abs: 1.2 10*3/uL (ref 0.7–4.0)
MCH: 30.4 pg (ref 26.0–34.0)
MCHC: 31.4 g/dL (ref 30.0–36.0)
MCV: 97 fL (ref 80.0–100.0)
Monocytes Absolute: 0.7 10*3/uL (ref 0.1–1.0)
Monocytes Relative: 10 %
Neutro Abs: 4.4 10*3/uL (ref 1.7–7.7)
Neutrophils Relative %: 66 %
Platelet Count: 113 10*3/uL — ABNORMAL LOW (ref 150–400)
RBC: 3.65 MIL/uL — ABNORMAL LOW (ref 3.87–5.11)
RDW: 14.6 % (ref 11.5–15.5)
WBC Count: 6.8 10*3/uL (ref 4.0–10.5)
nRBC: 0 % (ref 0.0–0.2)

## 2019-10-27 LAB — CMP (CANCER CENTER ONLY)
ALT: 12 U/L (ref 0–44)
AST: 15 U/L (ref 15–41)
Albumin: 4.4 g/dL (ref 3.5–5.0)
Alkaline Phosphatase: 42 U/L (ref 38–126)
Anion gap: 10 (ref 5–15)
BUN: 54 mg/dL — ABNORMAL HIGH (ref 8–23)
CO2: 36 mmol/L — ABNORMAL HIGH (ref 22–32)
Calcium: 10.7 mg/dL — ABNORMAL HIGH (ref 8.9–10.3)
Chloride: 95 mmol/L — ABNORMAL LOW (ref 98–111)
Creatinine: 2.02 mg/dL — ABNORMAL HIGH (ref 0.44–1.00)
GFR, Estimated: 23 mL/min — ABNORMAL LOW (ref >60)
Glucose, Bld: 116 mg/dL — ABNORMAL HIGH (ref 70–99)
Potassium: 4.8 mmol/L (ref 3.5–5.1)
Sodium: 141 mmol/L (ref 135–145)
Total Bilirubin: 0.7 mg/dL (ref 0.3–1.2)
Total Protein: 7 g/dL (ref 6.5–8.1)

## 2019-10-27 LAB — RETICULOCYTES
Immature Retic Fract: 10.8 % (ref 2.3–15.9)
RBC.: 3.61 MIL/uL — ABNORMAL LOW (ref 3.87–5.11)
Retic Count, Absolute: 39.7 10*3/uL (ref 19.0–186.0)
Retic Ct Pct: 1.1 % (ref 0.4–3.1)

## 2019-10-27 NOTE — Progress Notes (Signed)
Hematology and Oncology Follow Up Visit  [name] [id number] [birth year] 79 y.o. 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 She is doing well and states that her SOB is stable at this time. She is on 2L [place] when up and moving about and decreases to 1L when she is sitting or sleeping.  She has some fatigue at times.  No fever, chills, n/v, cough, rash, dizziness, chest pain, palpitations, abdominal pain or changes in bowel or bladder habits.  She takes a stool softener as needed for constipation.  She has puffiness in her ankles that waxes and wanes. Pedal pulses are 2+.  No fall or syncopal episodes to report.  She has maintained a good appetite and is doing her best to stay well hydrated. Her weight is stable.   ECOG Performance Status: 1 - Symptomatic but completely ambulatory  Medications:  Allergies as of 10/27/2019      Reactions   Montelukast Sodium Palpitations, Other (See Comments)   REACTION: HEART PALPITATIONS, CHEST PAIN.   Sulfa Antibiotics Other (See Comments)   CHEST PAIN   Sulfonamide Derivatives Other (See Comments)   CHEST PAIN   Oseltamivir Phosphate Diarrhea, Nausea And Vomiting, Other (See Comments)   TAMIFLU REACTION:  dizziness   Ace Inhibitors Other (See Comments)   PT. STATED UNKNOWN REACTION   Indomethacin Other (See Comments)   PT. STATED UNKNOWN REACTION      Medication List       Accurate as of October 27, 2019  2:17 PM. If you have any questions, ask your nurse or doctor.        Accu-Chek Aviva Plus test strip Generic drug: glucose blood CHECK BLOOD SUGAR ONE TIME WEEKLY   Accu-Chek Aviva Soln   Accu-Chek Softclix Lancets lancets CHECK BLOOD SUGAR ONE TIME WEEKLY   acetaminophen 500 MG tablet Commonly known as: TYLENOL Take 500 mg by  mouth 2 (two) times daily.   albuterol 108 (90 Base) MCG/ACT inhaler Commonly known as: VENTOLIN HFA Inhale 2 puffs into the lungs every 4 (four) hours as needed for wheezing or shortness of breath.   albuterol 0.63 MG/3ML nebulizer solution Commonly known as: ACCUNEB Take 3 mLs (0.63 mg total) by nebulization every 6 (six) hours as needed for wheezing or shortness of breath.   Alcohol Wipes 70 % Pads To use before checking blood sugars   allopurinol 100 MG tablet Commonly known as: ZYLOPRIM TAKE 2 TABLETS ONE TIME DAILY   b complex vitamins tablet Take 1 tablet by mouth daily.   benzonatate 100 MG capsule Commonly known as: TESSALON Take 1 capsule (100 mg total) by mouth 2 (two) times daily as needed for cough.   blood glucose meter kit and supplies Check blood sugars once weekly.   budesonide-formoterol 160-4.5 MCG/ACT inhaler Commonly known as: Symbicort Inhale 2 puffs into the lungs in the morning and at bedtime.   docusate sodium 100 MG capsule Commonly known as: COLACE Take 1 capsule (100 mg total) by mouth 2 (two) times daily.   Ferrous Fumarate-Folic Acid 443-1 MG Tabs Commonly known as: Hemocyte-F Take 1 tablet by mouth daily.   fluticasone 50 MCG/ACT nasal spray Commonly known as: FLONASE Place 2 sprays into both nostrils daily.   furosemide 40 MG tablet Commonly known as: LASIX TAKE 1 TABLET DAILY  AND A SECOND TABLET DAILY AS NEEDED FOR INCREASED EDEMA OR WEIGHT GAIN GREATER THAN 3 POUNDS IN 24 HOURS What changed:   how much to take  how to take this  when to take this  additional instructions   lidocaine 5 % Commonly known as: LIDODERM Place 2 patches onto the skin daily. Remove & Discard patch within 12 hours or as directed by MD   loratadine 10 MG tablet Commonly known as: CLARITIN Take 1 tablet (10 mg total) by mouth at bedtime. What changed:   when to take this  reasons to take this   methocarbamol 750 MG tablet Commonly known as:  ROBAXIN Take 1 tablet (750 mg total) by mouth 4 (four) times daily.   metoprolol succinate 50 MG 24 hr tablet Commonly known as: TOPROL-XL Take 1.5 tablets (75 mg total) by mouth daily. Take with or immediately following a meal.   montelukast 10 MG tablet Commonly known as: SINGULAIR Take 1 tablet (10 mg total) by mouth at bedtime.   OXYGEN Inhale 2 L into the lungs continuous.   pantoprazole 40 MG tablet Commonly known as: PROTONIX Take 40 mg by mouth 2 (two) times daily.   polyethylene glycol 17 g packet Commonly known as: MIRALAX / GLYCOLAX Take 17 g by mouth daily.   pravastatin 40 MG tablet Commonly known as: PRAVACHOL Take 1 tablet (40 mg total) by mouth every evening.   PROBIOTIC PO Take 1 tablet by mouth daily.   sertraline 100 MG tablet Commonly known as: ZOLOFT Take 1 tablet (100 mg total) by mouth daily.   traMADol 50 MG tablet Commonly known as: ULTRAM TAKE 1 TABLET BY MOUTH THREE TIMES DAILY AS NEEDED FOR MODERATE PAIN   trolamine salicylate 10 % cream Commonly known as: ASPERCREME Apply 1 application topically as needed for muscle pain.   vitamin B-12 1000 MCG tablet Commonly known as: CYANOCOBALAMIN Take 1,000 mcg by mouth daily.       Allergies:  Allergies  Allergen Reactions  . Montelukast Sodium Palpitations and Other (See Comments)    REACTION: HEART PALPITATIONS, CHEST PAIN.  Marland Kitchen Sulfa Antibiotics Other (See Comments)    CHEST PAIN  . Sulfonamide Derivatives Other (See Comments)    CHEST PAIN  . Oseltamivir Phosphate Diarrhea, Nausea And Vomiting and Other (See Comments)    TAMIFLU REACTION:  dizziness  . Ace Inhibitors Other (See Comments)    PT. STATED UNKNOWN REACTION  . Indomethacin Other (See Comments)    PT. STATED UNKNOWN REACTION    Past Medical History, Surgical history, Social history, and Family History were reviewed and updated.  Review of Systems: All other 10 point review of systems is negative.   Physical Exam:   vitals were not taken for this visit.   Wt Readings from Last 3 Encounters:  09/15/19 182 lb 1.9 oz (82.6 kg)  08/04/19 182 lb 1.9 oz (82.6 kg)  06/23/19 184 lb 0.6 oz (83.5 kg)    Ocular: Sclerae unicteric, pupils equal, round and reactive to light Ear-nose-throat: Oropharynx clear, dentition fair Lymphatic: No cervical or supraclavicular adenopathy Lungs no rales or rhonchi, good excursion bilaterally Heart regular rate and rhythm, no murmur appreciated Abd soft, nontender, positive bowel sounds MSK no focal spinal tenderness, no joint edema Neuro: non-focal, well-oriented, appropriate affect Breasts: Deferred   Lab Results  Component Value Date   WBC 6.8 10/27/2019   HGB 11.1 (L) 10/27/2019   HCT 35.4 (L) 10/27/2019   MCV 97.0 10/27/2019   PLT 113 (  L) 10/27/2019   Lab Results  Component Value Date   FERRITIN 333 (H) 09/15/2019   IRON 81 09/15/2019   TIBC 245 09/15/2019   UIBC 164 09/15/2019   IRONPCTSAT 33 09/15/2019   Lab Results  Component Value Date   RETICCTPCT 1.1 10/27/2019   RBC 3.61 (L) 10/27/2019   No results found for: KPAFRELGTCHN, LAMBDASER, KAPLAMBRATIO No results found for: IGGSERUM, IGA, IGMSERUM No results found for: TOTALPROTELP, ALBUMINELP, A1GS, A2GS, BETS, BETA2SER, GAMS, MSPIKE, SPEI   Chemistry      Component Value Date/Time   NA 142 10/06/2019 1300   NA 143 07/21/2017 1501   K 4.3 10/06/2019 1300   CL 98 10/06/2019 1300   CO2 36 (H) 10/06/2019 1300   BUN 59 (H) 10/06/2019 1300   BUN 41 (H) 07/21/2017 1501   CREATININE 1.91 (H) 10/06/2019 1300   CREATININE 1.36 (H) 08/23/2013 1134      Component Value Date/Time   CALCIUM 10.5 (H) 10/06/2019 1300   ALKPHOS 39 10/06/2019 1300   AST 13 (L) 10/06/2019 1300   ALT 11 10/06/2019 1300   BILITOT 0.5 10/06/2019 1300       Impression and Plan: [name] is a very pleasant 79yo caucasian female with multifactorial anemia. No Retacrit needed this visit, Hgb 11.1.  Iron studies are  pending and we will replace if needed.  Lab and injection in 3 weeks and follow-up in 6 weeks She can contact our office with any questions or concerns. We can certainly see her sooner if needed.   Laverna Peace, NP 10/7/20212:17 PM

## 2019-10-27 NOTE — Telephone Encounter (Signed)
Appointments scheduled calendar printed & mailed per 10/7 los

## 2019-10-28 LAB — FERRITIN: Ferritin: 476 ng/mL — ABNORMAL HIGH (ref 11–307)

## 2019-10-28 LAB — IRON AND TIBC
Iron: 148 ug/dL — ABNORMAL HIGH (ref 41–142)
Saturation Ratios: 60 % — ABNORMAL HIGH (ref 21–57)
TIBC: 246 ug/dL (ref 236–444)
UIBC: 99 ug/dL — ABNORMAL LOW (ref 120–384)

## 2019-10-29 DIAGNOSIS — S2242XS Multiple fractures of ribs, left side, sequela: Secondary | ICD-10-CM | POA: Diagnosis not present

## 2019-10-29 DIAGNOSIS — J449 Chronic obstructive pulmonary disease, unspecified: Secondary | ICD-10-CM | POA: Diagnosis not present

## 2019-10-29 DIAGNOSIS — I5042 Chronic combined systolic (congestive) and diastolic (congestive) heart failure: Secondary | ICD-10-CM | POA: Diagnosis not present

## 2019-11-14 DIAGNOSIS — R0902 Hypoxemia: Secondary | ICD-10-CM | POA: Diagnosis not present

## 2019-11-14 DIAGNOSIS — J4520 Mild intermittent asthma, uncomplicated: Secondary | ICD-10-CM | POA: Diagnosis not present

## 2019-11-15 ENCOUNTER — Telehealth: Payer: Self-pay | Admitting: Family

## 2019-11-15 NOTE — Telephone Encounter (Signed)
Patient called regarding her 10/28 appointments.  She stated she has been exposed to [place] by her daughter & grand-daughter.  They have both tested positive on 10/25.  I advised her that I would have to cancel her 10/28 appts and she would need to be tested before I could reschedule.  She will notify us when she gets this completed.

## 2019-11-16 ENCOUNTER — Other Ambulatory Visit: Payer: Medicare HMO

## 2019-11-16 DIAGNOSIS — Z20822 Contact with and (suspected) exposure to covid-19: Secondary | ICD-10-CM | POA: Diagnosis not present

## 2019-11-17 ENCOUNTER — Inpatient Hospital Stay: Payer: Medicare HMO

## 2019-11-17 LAB — NOVEL CORONAVIRUS, NAA: SARS-CoV-2, NAA: NOT DETECTED

## 2019-11-17 LAB — SARS-COV-2, NAA 2 DAY TAT

## 2019-11-23 DIAGNOSIS — D631 Anemia in chronic kidney disease: Secondary | ICD-10-CM | POA: Diagnosis not present

## 2019-11-23 DIAGNOSIS — E1122 Type 2 diabetes mellitus with diabetic chronic kidney disease: Secondary | ICD-10-CM | POA: Diagnosis not present

## 2019-11-23 DIAGNOSIS — I129 Hypertensive chronic kidney disease with stage 1 through stage 4 chronic kidney disease, or unspecified chronic kidney disease: Secondary | ICD-10-CM | POA: Diagnosis not present

## 2019-11-23 DIAGNOSIS — N184 Chronic kidney disease, stage 4 (severe): Secondary | ICD-10-CM | POA: Diagnosis not present

## 2019-11-27 ENCOUNTER — Other Ambulatory Visit: Payer: Self-pay | Admitting: Family Medicine

## 2019-11-28 NOTE — Telephone Encounter (Signed)
Last written: 10/17/19 Last ov:  06/16/19 Next ov: none Contract: UDS: 12/23/16

## 2019-11-29 DIAGNOSIS — S2242XS Multiple fractures of ribs, left side, sequela: Secondary | ICD-10-CM | POA: Diagnosis not present

## 2019-11-29 DIAGNOSIS — J449 Chronic obstructive pulmonary disease, unspecified: Secondary | ICD-10-CM | POA: Diagnosis not present

## 2019-11-29 DIAGNOSIS — I5042 Chronic combined systolic (congestive) and diastolic (congestive) heart failure: Secondary | ICD-10-CM | POA: Diagnosis not present

## 2019-12-08 ENCOUNTER — Encounter: Payer: Self-pay | Admitting: Family

## 2019-12-08 ENCOUNTER — Inpatient Hospital Stay: Payer: Medicare HMO

## 2019-12-08 ENCOUNTER — Inpatient Hospital Stay: Payer: Medicare HMO | Attending: Family

## 2019-12-08 ENCOUNTER — Inpatient Hospital Stay (HOSPITAL_BASED_OUTPATIENT_CLINIC_OR_DEPARTMENT_OTHER): Payer: Medicare HMO | Admitting: Family

## 2019-12-08 ENCOUNTER — Other Ambulatory Visit: Payer: Self-pay

## 2019-12-08 VITALS — BP 107/72 | HR 68 | Temp 99.0°F | Resp 20 | Ht 64.0 in | Wt 184.8 lb

## 2019-12-08 DIAGNOSIS — D631 Anemia in chronic kidney disease: Secondary | ICD-10-CM | POA: Diagnosis not present

## 2019-12-08 DIAGNOSIS — Z79899 Other long term (current) drug therapy: Secondary | ICD-10-CM | POA: Diagnosis not present

## 2019-12-08 DIAGNOSIS — N184 Chronic kidney disease, stage 4 (severe): Secondary | ICD-10-CM

## 2019-12-08 DIAGNOSIS — D508 Other iron deficiency anemias: Secondary | ICD-10-CM

## 2019-12-08 DIAGNOSIS — N183 Chronic kidney disease, stage 3 unspecified: Secondary | ICD-10-CM | POA: Diagnosis not present

## 2019-12-08 DIAGNOSIS — D509 Iron deficiency anemia, unspecified: Secondary | ICD-10-CM | POA: Insufficient documentation

## 2019-12-08 LAB — CBC WITH DIFFERENTIAL (CANCER CENTER ONLY)
Abs Immature Granulocytes: 0.03 10*3/uL (ref 0.00–0.07)
Basophils Absolute: 0 10*3/uL (ref 0.0–0.1)
Basophils Relative: 0 %
Eosinophils Absolute: 0.3 10*3/uL (ref 0.0–0.5)
Eosinophils Relative: 4 %
HCT: 28.9 % — ABNORMAL LOW (ref 36.0–46.0)
Hemoglobin: 9.2 g/dL — ABNORMAL LOW (ref 12.0–15.0)
Immature Granulocytes: 0 %
Lymphocytes Relative: 16 %
Lymphs Abs: 1.1 10*3/uL (ref 0.7–4.0)
MCH: 32.1 pg (ref 26.0–34.0)
MCHC: 31.8 g/dL (ref 30.0–36.0)
MCV: 100.7 fL — ABNORMAL HIGH (ref 80.0–100.0)
Monocytes Absolute: 0.8 10*3/uL (ref 0.1–1.0)
Monocytes Relative: 12 %
Neutro Abs: 4.6 10*3/uL (ref 1.7–7.7)
Neutrophils Relative %: 68 %
Platelet Count: 128 10*3/uL — ABNORMAL LOW (ref 150–400)
RBC: 2.87 MIL/uL — ABNORMAL LOW (ref 3.87–5.11)
RDW: 15.7 % — ABNORMAL HIGH (ref 11.5–15.5)
WBC Count: 6.7 10*3/uL (ref 4.0–10.5)
nRBC: 0 % (ref 0.0–0.2)

## 2019-12-08 LAB — CMP (CANCER CENTER ONLY)
ALT: 8 U/L (ref 0–44)
AST: 11 U/L — ABNORMAL LOW (ref 15–41)
Albumin: 4.2 g/dL (ref 3.5–5.0)
Alkaline Phosphatase: 41 U/L (ref 38–126)
Anion gap: 8 (ref 5–15)
BUN: 58 mg/dL — ABNORMAL HIGH (ref 8–23)
CO2: 33 mmol/L — ABNORMAL HIGH (ref 22–32)
Calcium: 10.2 mg/dL (ref 8.9–10.3)
Chloride: 100 mmol/L (ref 98–111)
Creatinine: 1.81 mg/dL — ABNORMAL HIGH (ref 0.44–1.00)
GFR, Estimated: 28 mL/min — ABNORMAL LOW (ref >60)
Glucose, Bld: 85 mg/dL (ref 70–99)
Potassium: 4.6 mmol/L (ref 3.5–5.1)
Sodium: 141 mmol/L (ref 135–145)
Total Bilirubin: 0.5 mg/dL (ref 0.3–1.2)
Total Protein: 7 g/dL (ref 6.5–8.1)

## 2019-12-08 LAB — RETICULOCYTES
Immature Retic Fract: 12.8 % (ref 2.3–15.9)
RBC.: 2.94 MIL/uL — ABNORMAL LOW (ref 3.87–5.11)
Retic Count, Absolute: 55.6 10*3/uL (ref 19.0–186.0)
Retic Ct Pct: 1.9 % (ref 0.4–3.1)

## 2019-12-08 MED ORDER — EPOETIN ALFA-EPBX 40000 UNIT/ML IJ SOLN
40000.0000 [IU] | Freq: Once | INTRAMUSCULAR | Status: AC
  Administered 2019-12-08: 40000 [IU] via SUBCUTANEOUS

## 2019-12-08 NOTE — Patient Instructions (Signed)

## 2019-12-08 NOTE — Progress Notes (Signed)
Pt discharged in no apparent distress. Pt left ambulatory with rolling walker. Pt aware of discharge instructions and verbalized understanding and had no further questions. 

## 2019-12-08 NOTE — Progress Notes (Signed)
Hematology and Oncology Follow Up Visit  [name] [id number] 28-Nov-1940 79 y.o. 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 She is doing well and has no complaints at this time.  No episodes of blood loss noted. No bruising or petechiae.  She is on 2L supplemental O2 24 hours a day and she feels that her SOB is well controlled.  No fever, chills, n/v, cough, rash, dizziness, chest pain, abdominal pain or changes in bowel or bladder habits. She notes occasional palpitations with over exertion. This resolves with taking a moment to rest.  She notes that lasix helps control her fluid retention well. Some swelling in her feet and ankles that waxes and wanes.  No tenderness, numbness or tingling in her extremities. No falls or syncope. She has a [employer] investment banker that she uses when ambulating for added support.  She states that she is eating well and is staying well hydrated. Her weight is stable at 184 lbs.   ECOG Performance Status: 1 - Symptomatic but completely ambulatory  Medications:  Allergies as of 12/08/2019      Reactions   Montelukast Sodium Palpitations, Other (See Comments)   REACTION: HEART PALPITATIONS, CHEST PAIN.   Sulfa Antibiotics Other (See Comments)   CHEST PAIN   Sulfonamide Derivatives Other (See Comments)   CHEST PAIN   Oseltamivir Phosphate Diarrhea, Nausea And Vomiting, Other (See Comments)   TAMIFLU REACTION:  dizziness   Ace Inhibitors Other (See Comments)   PT. STATED UNKNOWN REACTION   Indomethacin Other (See Comments)   PT. STATED UNKNOWN REACTION      Medication List       Accurate as of December 08, 2019  2:40 PM. If you have any questions, ask your nurse or doctor.        Accu-Chek Aviva Plus test strip Generic drug: glucose  blood CHECK BLOOD SUGAR ONE TIME WEEKLY   Accu-Chek Aviva Soln   Accu-Chek Softclix Lancets lancets CHECK BLOOD SUGAR ONE TIME WEEKLY   acetaminophen 500 MG tablet Commonly known as: TYLENOL Take 500 mg by mouth 2 (two) times daily.   albuterol 108 (90 Base) MCG/ACT inhaler Commonly known as: VENTOLIN HFA Inhale 2 puffs into the lungs every 4 (four) hours as needed for wheezing or shortness of breath.   albuterol 0.63 MG/3ML nebulizer solution Commonly known as: ACCUNEB Take 3 mLs (0.63 mg total) by nebulization every 6 (six) hours as needed for wheezing or shortness of breath.   Alcohol Wipes 70 % Pads To use before checking blood sugars   allopurinol 100 MG tablet Commonly known as: ZYLOPRIM TAKE 2 TABLETS ONE TIME DAILY   b complex vitamins tablet Take 1 tablet by mouth daily.   benzonatate 100 MG capsule Commonly known as: TESSALON Take 1 capsule (100 mg total) by mouth 2 (two) times daily as needed for cough.   blood glucose meter kit and supplies Check blood sugars once weekly.   budesonide-formoterol 160-4.5 MCG/ACT inhaler Commonly known as: Symbicort Inhale 2 puffs into the lungs in the morning and at bedtime.   docusate sodium 100 MG capsule Commonly known as: COLACE Take 1 capsule (100 mg total) by mouth 2 (two) times daily.   Ferrous Fumarate-Folic Acid 599-3 MG Tabs Commonly known as: Hemocyte-F Take 1 tablet by mouth daily.  fluticasone 50 MCG/ACT nasal spray Commonly known as: FLONASE Place 2 sprays into both nostrils daily.   furosemide 40 MG tablet Commonly known as: LASIX TAKE 1 TABLET DAILY AND A SECOND TABLET DAILY AS NEEDED FOR INCREASED EDEMA OR WEIGHT GAIN GREATER THAN 3 POUNDS IN 24 HOURS What changed:   how much to take  how to take this  when to take this  additional instructions   lidocaine 5 % Commonly known as: LIDODERM Place 2 patches onto the skin daily. Remove & Discard patch within 12 hours or as directed by MD    loratadine 10 MG tablet Commonly known as: CLARITIN Take 1 tablet (10 mg total) by mouth at bedtime. What changed:   when to take this  reasons to take this   methocarbamol 750 MG tablet Commonly known as: ROBAXIN Take 1 tablet (750 mg total) by mouth 4 (four) times daily.   metoprolol succinate 50 MG 24 hr tablet Commonly known as: TOPROL-XL Take 1.5 tablets (75 mg total) by mouth daily. Take with or immediately following a meal.   montelukast 10 MG tablet Commonly known as: SINGULAIR Take 1 tablet (10 mg total) by mouth at bedtime.   OXYGEN Inhale 2 L into the lungs continuous.   pantoprazole 40 MG tablet Commonly known as: PROTONIX Take 40 mg by mouth 2 (two) times daily.   polyethylene glycol 17 g packet Commonly known as: MIRALAX / GLYCOLAX Take 17 g by mouth daily.   pravastatin 40 MG tablet Commonly known as: PRAVACHOL Take 1 tablet (40 mg total) by mouth every evening.   PROBIOTIC PO Take 1 tablet by mouth daily.   sertraline 100 MG tablet Commonly known as: ZOLOFT Take 1 tablet (100 mg total) by mouth daily.   traMADol 50 MG tablet Commonly known as: ULTRAM TAKE 1 TABLET BY MOUTH THREE TIMES DAILY AS NEEDED FOR MODERATE PAIN   trolamine salicylate 10 % cream Commonly known as: ASPERCREME Apply 1 application topically as needed for muscle pain.   vitamin B-12 1000 MCG tablet Commonly known as: CYANOCOBALAMIN Take 1,000 mcg by mouth daily.       Allergies:  Allergies  Allergen Reactions  . Montelukast Sodium Palpitations and Other (See Comments)    REACTION: HEART PALPITATIONS, CHEST PAIN.  Marland Kitchen Sulfa Antibiotics Other (See Comments)    CHEST PAIN  . Sulfonamide Derivatives Other (See Comments)    CHEST PAIN  . Oseltamivir Phosphate Diarrhea, Nausea And Vomiting and Other (See Comments)    TAMIFLU REACTION:  dizziness  . Ace Inhibitors Other (See Comments)    PT. STATED UNKNOWN REACTION  . Indomethacin Other (See Comments)    PT. STATED  UNKNOWN REACTION    Past Medical History, Surgical history, Social history, and Family History were reviewed and updated.  Review of Systems: All other 10 point review of systems is negative.   Physical Exam:  height is _0  (1.626 m) and weight is 184 lb 12.8 oz (83.8 kg). Her oral temperature is 99 F (37.2 C). Her blood pressure is 107/72 and her pulse is 68. Her respiration is 20 and oxygen saturation is 99%.   Wt Readings from Last 3 Encounters:  12/08/19 184 lb 12.8 oz (83.8 kg)  10/27/19 181 lb 6.4 oz (82.3 kg)  09/15/19 182 lb 1.9 oz (82.6 kg)    Ocular: Sclerae unicteric, pupils equal, round and reactive to light Ear-nose-throat: Oropharynx clear, dentition fair Lymphatic: No cervical or supraclavicular adenopathy Lungs no rales or rhonchi, good  excursion bilaterally Heart regular rate and rhythm, no murmur appreciated Abd soft, nontender, positive bowel sounds MSK no focal spinal tenderness, no joint edema Neuro: non-focal, well-oriented, appropriate affect Breasts: Deferred   Lab Results  Component Value Date   WBC 6.7 12/08/2019   HGB 9.2 (L) 12/08/2019   HCT 28.9 (L) 12/08/2019   MCV 100.7 (H) 12/08/2019   PLT 128 (L) 12/08/2019   Lab Results  Component Value Date   FERRITIN 476 (H) 10/27/2019   IRON 148 (H) 10/27/2019   TIBC 246 10/27/2019   UIBC 99 (L) 10/27/2019   IRONPCTSAT 60 (H) 10/27/2019   Lab Results  Component Value Date   RETICCTPCT 1.9 12/08/2019   RBC 2.94 (L) 12/08/2019   No results found for: KPAFRELGTCHN, LAMBDASER, KAPLAMBRATIO No results found for: IGGSERUM, IGA, IGMSERUM No results found for: Ronnald Ramp, A1GS, A2GS, Violet Baldy, MSPIKE, SPEI   Chemistry      Component Value Date/Time   NA 141 12/08/2019 1357   NA 143 07/21/2017 1501   K 4.6 12/08/2019 1357   CL 100 12/08/2019 1357   CO2 33 (H) 12/08/2019 1357   BUN 58 (H) 12/08/2019 1357   BUN 41 (H) 07/21/2017 1501   CREATININE 1.81 (H) 12/08/2019  1357   CREATININE 1.36 (H) 08/23/2013 1134      Component Value Date/Time   CALCIUM 10.2 12/08/2019 1357   ALKPHOS 41 12/08/2019 1357   AST 11 (L) 12/08/2019 1357   ALT 8 12/08/2019 1357   BILITOT 0.5 12/08/2019 1357       Impression and Plan: Ms. Okun is a very pleasant 79yo caucasian female with multifactorial anemia. She received Retacrit today for Hgb 9.2.  Iron studies are pending. We will replace if needed.  Lab and injection in 3 weeks and follow-up in 6 weeks She was encouraged to contact our office with any questions or concerns.   Laverna Peace, NP 11/18/20212:40 PM

## 2019-12-09 ENCOUNTER — Telehealth: Payer: Self-pay

## 2019-12-09 LAB — FERRITIN: Ferritin: 444 ng/mL — ABNORMAL HIGH (ref 11–307)

## 2019-12-09 LAB — IRON AND TIBC
Iron: 84 ug/dL (ref 41–142)
Saturation Ratios: 32 % (ref 21–57)
TIBC: 259 ug/dL (ref 236–444)
UIBC: 175 ug/dL (ref 120–384)

## 2019-12-09 NOTE — Telephone Encounter (Signed)
S/w pt per 12/08/19 los and she is aware of her appts,,,, AOM

## 2019-12-15 DIAGNOSIS — J4520 Mild intermittent asthma, uncomplicated: Secondary | ICD-10-CM | POA: Diagnosis not present

## 2019-12-15 DIAGNOSIS — R0902 Hypoxemia: Secondary | ICD-10-CM | POA: Diagnosis not present

## 2019-12-19 ENCOUNTER — Other Ambulatory Visit: Payer: Self-pay

## 2019-12-19 MED ORDER — FUROSEMIDE 40 MG PO TABS
ORAL_TABLET | ORAL | 5 refills | Status: DC
Start: 2019-12-19 — End: 2021-03-07

## 2019-12-28 ENCOUNTER — Other Ambulatory Visit: Payer: Self-pay

## 2019-12-28 ENCOUNTER — Inpatient Hospital Stay: Payer: Medicare HMO | Attending: Family

## 2019-12-28 ENCOUNTER — Inpatient Hospital Stay: Payer: Medicare HMO

## 2019-12-28 VITALS — BP 118/42 | HR 75 | Temp 98.3°F | Resp 18

## 2019-12-28 DIAGNOSIS — N183 Chronic kidney disease, stage 3 unspecified: Secondary | ICD-10-CM | POA: Diagnosis not present

## 2019-12-28 DIAGNOSIS — Z79899 Other long term (current) drug therapy: Secondary | ICD-10-CM | POA: Diagnosis not present

## 2019-12-28 DIAGNOSIS — D631 Anemia in chronic kidney disease: Secondary | ICD-10-CM | POA: Diagnosis not present

## 2019-12-28 LAB — CBC WITH DIFFERENTIAL (CANCER CENTER ONLY)
Abs Immature Granulocytes: 0.02 10*3/uL (ref 0.00–0.07)
Basophils Absolute: 0 10*3/uL (ref 0.0–0.1)
Basophils Relative: 1 %
Eosinophils Absolute: 0.2 10*3/uL (ref 0.0–0.5)
Eosinophils Relative: 4 %
HCT: 32.3 % — ABNORMAL LOW (ref 36.0–46.0)
Hemoglobin: 9.8 g/dL — ABNORMAL LOW (ref 12.0–15.0)
Immature Granulocytes: 0 %
Lymphocytes Relative: 13 %
Lymphs Abs: 0.8 10*3/uL (ref 0.7–4.0)
MCH: 31.8 pg (ref 26.0–34.0)
MCHC: 30.3 g/dL (ref 30.0–36.0)
MCV: 104.9 fL — ABNORMAL HIGH (ref 80.0–100.0)
Monocytes Absolute: 0.5 10*3/uL (ref 0.1–1.0)
Monocytes Relative: 9 %
Neutro Abs: 4.2 10*3/uL (ref 1.7–7.7)
Neutrophils Relative %: 73 %
Platelet Count: 119 10*3/uL — ABNORMAL LOW (ref 150–400)
RBC: 3.08 MIL/uL — ABNORMAL LOW (ref 3.87–5.11)
RDW: 14.6 % (ref 11.5–15.5)
WBC Count: 5.7 10*3/uL (ref 4.0–10.5)
nRBC: 0 % (ref 0.0–0.2)

## 2019-12-28 LAB — CMP (CANCER CENTER ONLY)
ALT: 8 U/L (ref 0–44)
AST: 11 U/L — ABNORMAL LOW (ref 15–41)
Albumin: 4.2 g/dL (ref 3.5–5.0)
Alkaline Phosphatase: 40 U/L (ref 38–126)
Anion gap: 8 (ref 5–15)
BUN: 46 mg/dL — ABNORMAL HIGH (ref 8–23)
CO2: 34 mmol/L — ABNORMAL HIGH (ref 22–32)
Calcium: 10 mg/dL (ref 8.9–10.3)
Chloride: 100 mmol/L (ref 98–111)
Creatinine: 1.7 mg/dL — ABNORMAL HIGH (ref 0.44–1.00)
GFR, Estimated: 31 mL/min — ABNORMAL LOW (ref >60)
Glucose, Bld: 128 mg/dL — ABNORMAL HIGH (ref 70–99)
Potassium: 4.5 mmol/L (ref 3.5–5.1)
Sodium: 142 mmol/L (ref 135–145)
Total Bilirubin: 0.6 mg/dL (ref 0.3–1.2)
Total Protein: 6.6 g/dL (ref 6.5–8.1)

## 2019-12-28 MED ORDER — EPOETIN ALFA-EPBX 40000 UNIT/ML IJ SOLN
INTRAMUSCULAR | Status: AC
  Filled 2019-12-28: qty 1

## 2019-12-28 MED ORDER — EPOETIN ALFA-EPBX 40000 UNIT/ML IJ SOLN
40000.0000 [IU] | Freq: Once | INTRAMUSCULAR | Status: AC
  Administered 2019-12-28: 40000 [IU] via SUBCUTANEOUS

## 2019-12-28 NOTE — Patient Instructions (Signed)

## 2019-12-29 ENCOUNTER — Ambulatory Visit: Payer: Medicare HMO

## 2019-12-29 ENCOUNTER — Other Ambulatory Visit: Payer: Medicare HMO

## 2019-12-29 DIAGNOSIS — I5042 Chronic combined systolic (congestive) and diastolic (congestive) heart failure: Secondary | ICD-10-CM | POA: Diagnosis not present

## 2019-12-29 DIAGNOSIS — J449 Chronic obstructive pulmonary disease, unspecified: Secondary | ICD-10-CM | POA: Diagnosis not present

## 2019-12-29 DIAGNOSIS — S2242XS Multiple fractures of ribs, left side, sequela: Secondary | ICD-10-CM | POA: Diagnosis not present

## 2020-01-09 ENCOUNTER — Other Ambulatory Visit: Payer: Self-pay | Admitting: Family Medicine

## 2020-01-10 NOTE — Telephone Encounter (Signed)
I have refilled her medicine but she will need an appt for further refills.

## 2020-01-10 NOTE — Telephone Encounter (Signed)
Patient scheduled.

## 2020-01-10 NOTE — Telephone Encounter (Signed)
Requesting: Tramadol 50 mg Contract: n/a UDS: 12/23/2016 Last Visit: 06/16/2019 Next Visit: n/a Last Refill: 11/28/2019  Please Advise

## 2020-01-14 DIAGNOSIS — J4520 Mild intermittent asthma, uncomplicated: Secondary | ICD-10-CM | POA: Diagnosis not present

## 2020-01-14 DIAGNOSIS — R0902 Hypoxemia: Secondary | ICD-10-CM | POA: Diagnosis not present

## 2020-01-18 ENCOUNTER — Inpatient Hospital Stay: Payer: Medicare HMO | Admitting: Family

## 2020-01-18 ENCOUNTER — Telehealth: Payer: Self-pay

## 2020-01-18 ENCOUNTER — Inpatient Hospital Stay: Payer: Medicare HMO

## 2020-01-18 NOTE — Telephone Encounter (Signed)
Returned pts call as she left a vm that ths had laid down and did not awaken in time for her appt, appt has been r/s      aom

## 2020-01-19 ENCOUNTER — Other Ambulatory Visit: Payer: Medicare HMO

## 2020-01-19 ENCOUNTER — Ambulatory Visit: Payer: Medicare HMO

## 2020-01-19 ENCOUNTER — Ambulatory Visit: Payer: Medicare HMO | Admitting: Family

## 2020-01-25 ENCOUNTER — Inpatient Hospital Stay: Payer: Medicare HMO | Attending: Family

## 2020-01-25 ENCOUNTER — Inpatient Hospital Stay: Payer: Medicare HMO

## 2020-01-25 ENCOUNTER — Encounter: Payer: Self-pay | Admitting: Family

## 2020-01-25 ENCOUNTER — Other Ambulatory Visit: Payer: Self-pay

## 2020-01-25 ENCOUNTER — Telehealth: Payer: Self-pay | Admitting: Family

## 2020-01-25 ENCOUNTER — Inpatient Hospital Stay (HOSPITAL_BASED_OUTPATIENT_CLINIC_OR_DEPARTMENT_OTHER): Payer: Medicare HMO | Admitting: Family

## 2020-01-25 VITALS — BP 115/52 | HR 81 | Temp 97.9°F | Resp 18 | Ht 63.0 in | Wt 185.4 lb

## 2020-01-25 DIAGNOSIS — M7989 Other specified soft tissue disorders: Secondary | ICD-10-CM | POA: Insufficient documentation

## 2020-01-25 DIAGNOSIS — E611 Iron deficiency: Secondary | ICD-10-CM | POA: Insufficient documentation

## 2020-01-25 DIAGNOSIS — N184 Chronic kidney disease, stage 4 (severe): Secondary | ICD-10-CM

## 2020-01-25 DIAGNOSIS — Z79899 Other long term (current) drug therapy: Secondary | ICD-10-CM | POA: Insufficient documentation

## 2020-01-25 DIAGNOSIS — R42 Dizziness and giddiness: Secondary | ICD-10-CM | POA: Diagnosis not present

## 2020-01-25 DIAGNOSIS — D631 Anemia in chronic kidney disease: Secondary | ICD-10-CM

## 2020-01-25 DIAGNOSIS — N183 Chronic kidney disease, stage 3 unspecified: Secondary | ICD-10-CM | POA: Diagnosis not present

## 2020-01-25 DIAGNOSIS — K5900 Constipation, unspecified: Secondary | ICD-10-CM | POA: Insufficient documentation

## 2020-01-25 DIAGNOSIS — D508 Other iron deficiency anemias: Secondary | ICD-10-CM

## 2020-01-25 DIAGNOSIS — R5383 Other fatigue: Secondary | ICD-10-CM | POA: Insufficient documentation

## 2020-01-25 DIAGNOSIS — R0602 Shortness of breath: Secondary | ICD-10-CM | POA: Diagnosis not present

## 2020-01-25 LAB — CBC WITH DIFFERENTIAL (CANCER CENTER ONLY)
Abs Immature Granulocytes: 0.07 10*3/uL (ref 0.00–0.07)
Basophils Absolute: 0 10*3/uL (ref 0.0–0.1)
Basophils Relative: 1 %
Eosinophils Absolute: 0.4 10*3/uL (ref 0.0–0.5)
Eosinophils Relative: 6 %
HCT: 34.4 % — ABNORMAL LOW (ref 36.0–46.0)
Hemoglobin: 10.7 g/dL — ABNORMAL LOW (ref 12.0–15.0)
Immature Granulocytes: 1 %
Lymphocytes Relative: 15 %
Lymphs Abs: 0.9 10*3/uL (ref 0.7–4.0)
MCH: 31.6 pg (ref 26.0–34.0)
MCHC: 31.1 g/dL (ref 30.0–36.0)
MCV: 101.5 fL — ABNORMAL HIGH (ref 80.0–100.0)
Monocytes Absolute: 0.6 10*3/uL (ref 0.1–1.0)
Monocytes Relative: 9 %
Neutro Abs: 4.1 10*3/uL (ref 1.7–7.7)
Neutrophils Relative %: 68 %
Platelet Count: 126 10*3/uL — ABNORMAL LOW (ref 150–400)
RBC: 3.39 MIL/uL — ABNORMAL LOW (ref 3.87–5.11)
RDW: 13.5 % (ref 11.5–15.5)
WBC Count: 6.1 10*3/uL (ref 4.0–10.5)
nRBC: 0 % (ref 0.0–0.2)

## 2020-01-25 LAB — CMP (CANCER CENTER ONLY)
ALT: 10 U/L (ref 0–44)
AST: 12 U/L — ABNORMAL LOW (ref 15–41)
Albumin: 4.2 g/dL (ref 3.5–5.0)
Alkaline Phosphatase: 42 U/L (ref 38–126)
Anion gap: 7 (ref 5–15)
BUN: 37 mg/dL — ABNORMAL HIGH (ref 8–23)
CO2: 34 mmol/L — ABNORMAL HIGH (ref 22–32)
Calcium: 10.4 mg/dL — ABNORMAL HIGH (ref 8.9–10.3)
Chloride: 102 mmol/L (ref 98–111)
Creatinine: 1.38 mg/dL — ABNORMAL HIGH (ref 0.44–1.00)
GFR, Estimated: 39 mL/min — ABNORMAL LOW (ref >60)
Glucose, Bld: 107 mg/dL — ABNORMAL HIGH (ref 70–99)
Potassium: 4.2 mmol/L (ref 3.5–5.1)
Sodium: 143 mmol/L (ref 135–145)
Total Bilirubin: 0.5 mg/dL (ref 0.3–1.2)
Total Protein: 6.8 g/dL (ref 6.5–8.1)

## 2020-01-25 LAB — RETICULOCYTES
Immature Retic Fract: 7.6 % (ref 2.3–15.9)
RBC.: 3.42 MIL/uL — ABNORMAL LOW (ref 3.87–5.11)
Retic Count, Absolute: 34.9 10*3/uL (ref 19.0–186.0)
Retic Ct Pct: 1 % (ref 0.4–3.1)

## 2020-01-25 MED ORDER — EPOETIN ALFA-EPBX 40000 UNIT/ML IJ SOLN
40000.0000 [IU] | Freq: Once | INTRAMUSCULAR | Status: AC
  Administered 2020-01-25: 40000 [IU] via SUBCUTANEOUS

## 2020-01-25 MED ORDER — EPOETIN ALFA-EPBX 40000 UNIT/ML IJ SOLN
INTRAMUSCULAR | Status: AC
  Filled 2020-01-25: qty 1

## 2020-01-25 NOTE — Progress Notes (Signed)
Hematology and Oncology Follow Up Visit  [name] [id number] 07/08/1940 80 y.o. 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 She is doing well but notes occasional fatigue and dizziness.  SOB has remained stable on 1-2 L [place] supplemental O2.  She has had no falls or syncope. She ambulates with a Rolator for added support.  No episodes of blood noted. No abnormal bruising or petechiae.  No fever, chills, n/v, cough, rash, dizziness, SOB, chest pain, palpitations, abdominal pain or changes in bowel or bladder habits.  She is restarting Miralax daily for occasional constipation.  She has chronic swelling in her lower extremities that is unchanged.  No neuropathy.  She states that she has a good appetite and is staying well hydrated. Her weight is stable at 185 lbs.  ECOG Performance Status: 1 - Symptomatic but completely ambulatory  Medications:  Allergies as of 01/25/2020      Reactions   Montelukast Sodium Palpitations, Other (See Comments)   REACTION: HEART PALPITATIONS, CHEST PAIN.   Sulfa Antibiotics Other (See Comments)   CHEST PAIN   Sulfonamide Derivatives Other (See Comments)   CHEST PAIN   Oseltamivir Phosphate Diarrhea, Nausea And Vomiting, Other (See Comments)   TAMIFLU REACTION:  dizziness   Ace Inhibitors Other (See Comments)   PT. STATED UNKNOWN REACTION   Indomethacin Other (See Comments)   PT. STATED UNKNOWN REACTION      Medication List       Accurate as of January 25, 2020  2:12 PM. If you have any questions, ask your nurse or doctor.        STOP taking these medications   lidocaine 5 % Commonly known as: LIDODERM Stopped by: Laverna Peace, NP   methocarbamol 750 MG tablet Commonly known as: ROBAXIN Stopped by: Laverna Peace, NP     TAKE these  medications   Accu-Chek Aviva Plus test strip Generic drug: glucose blood CHECK BLOOD SUGAR ONE TIME WEEKLY   Accu-Chek Aviva Soln   Accu-Chek Softclix Lancets lancets CHECK BLOOD SUGAR ONE TIME WEEKLY   acetaminophen 500 MG tablet Commonly known as: TYLENOL Take 500 mg by mouth 2 (two) times daily.   albuterol 108 (90 Base) MCG/ACT inhaler Commonly known as: VENTOLIN HFA Inhale 2 puffs into the lungs every 4 (four) hours as needed for wheezing or shortness of breath.   albuterol 0.63 MG/3ML nebulizer solution Commonly known as: ACCUNEB Take 3 mLs (0.63 mg total) by nebulization every 6 (six) hours as needed for wheezing or shortness of breath.   albuterol (2.5 MG/3ML) 0.083% nebulizer solution Commonly known as: PROVENTIL   Alcohol Wipes 70 % Pads To use before checking blood sugars   allopurinol 100 MG tablet Commonly known as: ZYLOPRIM TAKE 2 TABLETS ONE TIME DAILY   b complex vitamins tablet Take 1 tablet by mouth daily.   benzonatate 100 MG capsule Commonly known as: TESSALON Take 1 capsule (100 mg total) by mouth 2 (two) times daily as needed for cough.   blood glucose meter kit and supplies Check blood sugars once weekly.   budesonide-formoterol 160-4.5 MCG/ACT inhaler Commonly known as: Symbicort Inhale 2 puffs into the lungs in the morning and at bedtime.   docusate sodium 100 MG capsule Commonly known as: COLACE Take 1 capsule (100 mg total) by mouth 2 (  two) times daily.   Ferrous Fumarate-Folic Acid 062-3 MG Tabs Commonly known as: Hemocyte-F Take 1 tablet by mouth daily.   fluticasone 50 MCG/ACT nasal spray Commonly known as: FLONASE Place 2 sprays into both nostrils daily.   furosemide 40 MG tablet Commonly known as: LASIX TAKE 1 TABLET DAILY AND A SECOND TABLET DAILY AS NEEDED FOR INCREASED EDEMA OR WEIGHT GAIN GREATER THAN 3 POUNDS IN 24 HOURS   loratadine 10 MG tablet Commonly known as: CLARITIN Take 1 tablet (10 mg total) by mouth at  bedtime. What changed:   when to take this  reasons to take this   metoprolol succinate 50 MG 24 hr tablet Commonly known as: TOPROL-XL Take 1.5 tablets (75 mg total) by mouth daily. Take with or immediately following a meal.   montelukast 10 MG tablet Commonly known as: SINGULAIR Take 1 tablet (10 mg total) by mouth at bedtime.   OXYGEN Inhale 2 L into the lungs continuous.   pantoprazole 40 MG tablet Commonly known as: PROTONIX Take 40 mg by mouth 2 (two) times daily.   polyethylene glycol 17 g packet Commonly known as: MIRALAX / GLYCOLAX Take 17 g by mouth daily.   pravastatin 40 MG tablet Commonly known as: PRAVACHOL Take 1 tablet (40 mg total) by mouth every evening.   PROBIOTIC PO Take 1 tablet by mouth daily.   sertraline 100 MG tablet Commonly known as: ZOLOFT Take 1 tablet (100 mg total) by mouth daily.   traMADol 50 MG tablet Commonly known as: ULTRAM TAKE 1 TABLET BY MOUTH THREE TIMES DAILY AS NEEDED FOR MODERATE PAIN   trolamine salicylate 10 % cream Commonly known as: ASPERCREME Apply 1 application topically as needed for muscle pain.   vitamin B-12 1000 MCG tablet Commonly known as: CYANOCOBALAMIN Take 1,000 mcg by mouth daily.       Allergies:  Allergies  Allergen Reactions  . Montelukast Sodium Palpitations and Other (See Comments)    REACTION: HEART PALPITATIONS, CHEST PAIN.  Marland Kitchen Sulfa Antibiotics Other (See Comments)    CHEST PAIN  . Sulfonamide Derivatives Other (See Comments)    CHEST PAIN  . Oseltamivir Phosphate Diarrhea, Nausea And Vomiting and Other (See Comments)    TAMIFLU REACTION:  dizziness  . Ace Inhibitors Other (See Comments)    PT. STATED UNKNOWN REACTION  . Indomethacin Other (See Comments)    PT. STATED UNKNOWN REACTION    Past Medical History, Surgical history, Social history, and Family History were reviewed and updated.  Review of Systems: All other 10 point review of systems is negative.   Physical Exam:   height is $RemoveB'5\' 3"'kPJMhLte$  (1.6 m) and weight is 185 lb 6.4 oz (84.1 kg). Her oral temperature is 97.9 F (36.6 C). Her blood pressure is 115/52 (abnormal) and her pulse is 81. Her respiration is 18 and oxygen saturation is 96%.   Wt Readings from Last 3 Encounters:  01/25/20 185 lb 6.4 oz (84.1 kg)  12/08/19 184 lb 12.8 oz (83.8 kg)  10/27/19 181 lb 6.4 oz (82.3 kg)    Ocular: Sclerae unicteric, pupils equal, round and reactive to light Ear-nose-throat: Oropharynx clear, dentition fair Lymphatic: No cervical or supraclavicular adenopathy Lungs no rales or rhonchi, good excursion bilaterally Heart regular rate and rhythm, no murmur appreciated Abd soft, nontender, positive bowel sounds MSK no focal spinal tenderness, no joint edema Neuro: non-focal, well-oriented, appropriate affect Breasts: Deferred   Lab Results  Component Value Date   WBC 6.1 01/25/2020   HGB  10.7 (L) 01/25/2020   HCT 34.4 (L) 01/25/2020   MCV 101.5 (H) 01/25/2020   PLT 126 (L) 01/25/2020   Lab Results  Component Value Date   FERRITIN 444 (H) 12/08/2019   IRON 84 12/08/2019   TIBC 259 12/08/2019   UIBC 175 12/08/2019   IRONPCTSAT 32 12/08/2019   Lab Results  Component Value Date   RETICCTPCT 1.0 01/25/2020   RBC 3.42 (L) 01/25/2020   No results found for: KPAFRELGTCHN, LAMBDASER, KAPLAMBRATIO No results found for: IGGSERUM, IGA, IGMSERUM No results found for: Ronnald Ramp, A1GS, A2GS, Violet Baldy, MSPIKE, SPEI   Chemistry      Component Value Date/Time   NA 142 12/28/2019 1357   NA 143 07/21/2017 1501   K 4.5 12/28/2019 1357   CL 100 12/28/2019 1357   CO2 34 (H) 12/28/2019 1357   BUN 46 (H) 12/28/2019 1357   BUN 41 (H) 07/21/2017 1501   CREATININE 1.70 (H) 12/28/2019 1357   CREATININE 1.36 (H) 08/23/2013 1134      Component Value Date/Time   CALCIUM 10.0 12/28/2019 1357   ALKPHOS 40 12/28/2019 1357   AST 11 (L) 12/28/2019 1357   ALT 8 12/28/2019 1357   BILITOT 0.6 12/28/2019  1357       Impression and Plan: Ms. Izquierdo is a very pleasant 80yo caucasian female with multifactorial anemia. She received ESA today for Hgb 10.7.  Iron studies are pending. We can replace if needed.  Lab and injection in 3 weeks follow-up in 6 weeks.  Can contact our office with any questions or concerns. We can certainly see her sooner if needed.   Laverna Peace, NP 1/5/20222:12 PM

## 2020-01-25 NOTE — Telephone Encounter (Signed)
Appointments scheduled per 1/5 los.  Patient requested updates from My Chart

## 2020-01-25 NOTE — Patient Instructions (Signed)

## 2020-01-26 LAB — FERRITIN: Ferritin: 358 ng/mL — ABNORMAL HIGH (ref 11–307)

## 2020-01-26 LAB — IRON AND TIBC
Iron: 69 ug/dL (ref 41–142)
Saturation Ratios: 27 % (ref 21–57)
TIBC: 254 ug/dL (ref 236–444)
UIBC: 184 ug/dL (ref 120–384)

## 2020-01-27 ENCOUNTER — Ambulatory Visit: Payer: Medicare HMO | Admitting: Cardiology

## 2020-01-27 ENCOUNTER — Other Ambulatory Visit: Payer: Self-pay

## 2020-01-27 VITALS — BP 110/54 | HR 68 | Ht 64.0 in | Wt 188.8 lb

## 2020-01-27 DIAGNOSIS — I10 Essential (primary) hypertension: Secondary | ICD-10-CM

## 2020-01-27 DIAGNOSIS — I739 Peripheral vascular disease, unspecified: Secondary | ICD-10-CM | POA: Diagnosis not present

## 2020-01-27 DIAGNOSIS — R55 Syncope and collapse: Secondary | ICD-10-CM

## 2020-01-27 DIAGNOSIS — N1831 Chronic kidney disease, stage 3a: Secondary | ICD-10-CM

## 2020-01-27 DIAGNOSIS — I5032 Chronic diastolic (congestive) heart failure: Secondary | ICD-10-CM

## 2020-01-27 DIAGNOSIS — E782 Mixed hyperlipidemia: Secondary | ICD-10-CM

## 2020-01-27 NOTE — Addendum Note (Signed)
Addended by: Antonieta Iba on: 01/27/2020 03:33 PM   Modules accepted: Orders

## 2020-01-27 NOTE — Progress Notes (Signed)
Cardiology Office Note:    Date:  01/27/2020   ID:  [name], DOB [birth year], MRN [id number]  PCP:  Mosie Lukes, MD  Cardiologist:  Fransico Him, MD   Referring MD: Mosie Lukes, MD   Chief Complaint  Patient presents with  . Congestive Heart Failure  . Hypertension  . Hyperlipidemia    History of Present Illness:    [name] is a 80 y.o. female with a hx of asthma, atypical CP with normal coronary arteries by cath in 2008 in the setting of NSTEMI complicated by right groin pseudoaneurysm, chronic diastolic CHF, GERD, HTN and hyperlipidemia. 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 Lastecho showed normal LVF with mild MR and grade I diastolic dysfunction.  She is here today for followup and is doing well.  She has chronic DOE/SOB related to COPD and her breathing is stable on 2L [place].  She has chronic LE edema that is stable.  She denies any chest pain or pressure, PND, orthopnea, dizziness, palpitations or syncope. She is compliant with her meds and is tolerating meds with no SE.    Past Medical History:  Diagnosis Date  . Abnormal glucose tolerance test   . Abnormal TSH 09/22/2016  . ACE-inhibitor cough   . Anemia 03/12/2014  . Arthritis   . Asthma    PFT 02/06/09 FEV1 1.41 (65%), FVC 1.92 (64), FEV1% 74, TLC 3.47 (71%), DLCO 48%, +BD  . Atypical chest pain    s/p cath, Normal coronaries, Non ST elevation myocardial infarction, Rt groin pseudoaneurysm  . Bacterial vaginosis 03/12/2014  . Cellulitis 06/22/2016  . Chronic kidney disease (CKD), stage III (moderate) ([place]) 08/04/2016  . COPD (chronic obstructive pulmonary disease) ([place])   . Depression   . Diastolic heart failure ([place]) 04/07/2016  . DVT (deep venous thrombosis) ([place]) 1987  . GERD (gastroesophageal reflux disease)   . Gout   . Hypercalcemia 10/15/2014  . Hyperlipidemia   . Hypertension   . Hypoxia 10/15/2014  . Macular degeneration 04/10/2015  . Osteopenia 12/29/2006    Qualifier: Diagnosis of  By: Wynona Luna   . Pneumonia   . 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 Renal insufficiency 09/22/2016  . Unspecified constipation 06/05/2013  . Vitamin D deficiency 01/01/2015    Past Surgical History:  Procedure Laterality Date  . APPENDECTOMY  1951  . TONSILLECTOMY  1950  . TUBAL LIGATION  1968    Current Medications: Current Meds  Medication Sig  . Accu-Chek Softclix Lancets lancets CHECK BLOOD SUGAR ONE TIME WEEKLY  . acetaminophen (TYLENOL) 500 MG tablet Take 500 mg by mouth 2 (two) times daily.   Marland Kitchen albuterol (ACCUNEB) 0.63 MG/3ML nebulizer solution Take 3 mLs (0.63 mg total) by nebulization every 6 (six) hours as needed for wheezing or shortness of breath.  Marland Kitchen albuterol (PROVENTIL) (2.5 MG/3ML) 0.083% nebulizer solution   . albuterol (VENTOLIN HFA) 108 (90 Base) MCG/ACT inhaler Inhale 2 puffs into the lungs every 4 (four) hours as needed for wheezing or shortness of breath.  . Alcohol Swabs (ALCOHOL WIPES) 70 % PADS To use before checking blood sugars  . allopurinol (ZYLOPRIM) 100 MG tablet TAKE 2 TABLETS ONE TIME DAILY  . b complex vitamins tablet Take 1 tablet by mouth daily.  . benzonatate (TESSALON) 100 MG capsule Take 1 capsule (100 mg total) by mouth 2 (two) times daily as needed for cough.  . Blood Glucose  Calibration (ACCU-CHEK AVIVA) SOLN   . blood glucose meter kit and supplies Check blood sugars once weekly.  . budesonide-formoterol (SYMBICORT) 160-4.5 MCG/ACT inhaler Inhale 2 puffs into the lungs in the morning and at bedtime.  . docusate sodium (COLACE) 100 MG capsule Take 1 capsule (100 mg total) by mouth 2 (two) times daily.  . Ferrous Fumarate-Folic Acid (HEMOCYTE-F) 324-1 MG TABS Take 1 tablet by mouth daily.  . fluticasone (FLONASE) 50 MCG/ACT nasal spray Place 2 sprays into both nostrils daily.  . furosemide (LASIX) 40 MG tablet TAKE 1 TABLET DAILY AND A SECOND TABLET DAILY AS NEEDED FOR INCREASED EDEMA OR WEIGHT GAIN  GREATER THAN 3 POUNDS IN 24 HOURS  . glucose blood (ACCU-CHEK AVIVA PLUS) test strip CHECK BLOOD SUGAR ONE TIME WEEKLY  . loratadine (CLARITIN) 10 MG tablet Take 1 tablet (10 mg total) by mouth at bedtime. (Patient taking differently: Take 10 mg by mouth at bedtime as needed for allergies.)  . metoprolol succinate (TOPROL-XL) 50 MG 24 hr tablet Take 1.5 tablets (75 mg total) by mouth daily. Take with or immediately following a meal.  . montelukast (SINGULAIR) 10 MG tablet Take 1 tablet (10 mg total) by mouth at bedtime.  . OXYGEN Inhale 2 L into the lungs continuous.   . pantoprazole (PROTONIX) 40 MG tablet Take 40 mg by mouth 2 (two) times daily.  . polyethylene glycol (MIRALAX / GLYCOLAX) 17 g packet Take 17 g by mouth daily.  . pravastatin (PRAVACHOL) 40 MG tablet Take 1 tablet (40 mg total) by mouth every evening.  . Probiotic Product (PROBIOTIC PO) Take 1 tablet by mouth daily.   . sertraline (ZOLOFT) 100 MG tablet Take 1 tablet (100 mg total) by mouth daily.  . traMADol (ULTRAM) 50 MG tablet TAKE 1 TABLET BY MOUTH THREE TIMES DAILY AS NEEDED FOR MODERATE PAIN  . trolamine salicylate (ASPERCREME) 10 % cream Apply 1 application topically as needed for muscle pain.  . vitamin B-12 (CYANOCOBALAMIN) 1000 MCG tablet Take 1,000 mcg by mouth daily.      Allergies:   Montelukast sodium, Sulfa antibiotics, Sulfonamide derivatives, Oseltamivir phosphate, Ace inhibitors, and Indomethacin   Social History   Socioeconomic History  . Marital status: Widowed    Spouse name: Not on file  . Number of children: 1  . Years of education: Not on file  . Highest education level: Not on file  Occupational History  . Occupation: Retired    Fish farm manager: [employer]  Tobacco Use  . Smoking status: Never Smoker  . Smokeless tobacco: Never Used  Vaping Use  . Vaping Use: Never used  Substance and Sexual Activity  . Alcohol use: No  . Drug use: No  . Sexual activity: Never    Comment: lives alone, no dietary  restrictions  Other Topics Concern  . Not on file  Social History Narrative  . Not on file   Social Determinants of Health   Financial Resource Strain: Not on file  Food Insecurity: Not on file  Transportation Needs: Not on file  Physical Activity: Not on file  Stress: Not on file  Social Connections: Not on file     Family History: The patient's family history includes Arthritis in her brother, sister, and sister; Asthma in her sister; COPD in her brother and sister; Colon polyps in her sister; Coronary artery disease in her brother and another family member; Emphysema in her brother and sister; Epilepsy in her daughter; Heart attack in her brother and mother; Heart disease  in her brother, mother, and sister; Hyperlipidemia in her mother, sister, sister, and sister; Hypertension in her daughter, sister, sister, and sister; Lung cancer in her brother and brother; Mental illness in her father; Obesity in her daughter; Suicidality in her father; Uterine cancer in her sister.  ROS:   Please see the history of present illness.    Review of Systems  Skin: Negative for unusual hair distribution.  Allergic/Immunologic: Negative for environmental allergies.    All other systems reviewed and negative.   EKGs/Labs/Other Studies Reviewed:    The following studies were reviewed today: none  EKG:  EKG is  ordered today.  The ekg ordered today demonstrates NSR at 68bpm with PACs and anterior infarct   Recent Labs: 07/06/2019: TSH 4.79 01/25/2020: ALT 10; BUN 37; Creatinine 1.38; Hemoglobin 10.7; Platelet Count 126; Potassium 4.2; Sodium 143   Recent Lipid Panel    Component Value Date/Time   CHOL 151 07/27/2019 1701   TRIG 168 (H) 07/27/2019 1701   TRIG 122 01/27/2006 1037   HDL 47 07/27/2019 1701   CHOLHDL 3.2 07/27/2019 1701   CHOLHDL 3 07/06/2019 0804   VLDL 16.6 07/06/2019 0804   LDLCALC 75 07/27/2019 1701   LDLDIRECT 73.0 06/16/2017 1543    Physical Exam:    VS:  BP (!)  110/54   Pulse 68   Ht _0  (1.626 m)   Wt 188 lb 12.8 oz (85.6 kg)   BMI 32.41 kg/m     Wt Readings from Last 3 Encounters:  01/27/20 188 lb 12.8 oz (85.6 kg)  01/25/20 185 lb 6.4 oz (84.1 kg)  12/08/19 184 lb 12.8 oz (83.8 kg)     GEN: Well nourished, well developed in no acute distress HEENT: Normal NECK: No JVD; No carotid bruits LYMPHATICS: No lymphadenopathy CARDIAC:RRR, no murmurs, rubs, gallops RESPIRATORY:  Clear to auscultation without rales, wheezing or rhonchi  ABDOMEN: Soft, non-tender, non-distended MUSCULOSKELETAL:  Trace LE edema; No deformity  SKIN: Warm and dry NEUROLOGIC:  Alert and oriented x 3 PSYCHIATRIC:  Normal affect    ASSESSMENT:    1. Chronic diastolic (congestive) heart failure ([place])   2. Essential hypertension   3. Syncope and collapse   4. Peripheral arterial disease ([place])   5. Hyperlipidemia, mixed   6. Stage 3a chronic kidney disease ([place])    PLAN:    In order of problems listed above:  1.  Chronic diastolic CHF -she does not appear volume overloaded on exam today -she has chronic DOE related to COPD which is stable at baseline -Continue Lasix 34m daily -SCr stable at 1.7 and K+ 4.5 on 12/28/2019 -check BMET and Mag today as she has been having some leg cramps at night and is now off K+ per her nephrologist  2.  HTN -BP is well controlled on exam today -continue Toprol XL 766mdaily  3.  Syncope -she has not had any further dizziness or syncope  4. PAD -Doppler 07/2016 showed greater than 50% bilateral external iliac artery stenosis, 50 to 74% bilateral CFA stenosis, 50 to 74% right distal SFA stenosis with three-vessel runoff bilaterally. -See by Dr. BeGwenlyn Foundnd felt that patient's LE pain was not due to arterial insufficiency  5.  HLD -LDL goal < 70 -LDL 75 in July 2021 -continue pravastatin 4026maily  6.  CKD stage 3a -followed by nephrology -renal is managing diuretics  Followup with me in 1 year  Medication  Adjustments/Labs and Tests Ordered: Current medicines are reviewed at length  with the patient today.  Concerns regarding medicines are outlined above.  Orders Placed This Encounter  Procedures  . EKG 12-Lead   No orders of the defined types were placed in this encounter.   Signed, Fransico Him, MD  01/27/2020 3:29 PM    [place]

## 2020-01-27 NOTE — Patient Instructions (Signed)
Medication Instructions:  Your physician recommends that you continue on your current medications as directed. Please refer to the Current Medication list given to you today.  *If you need a refill on your cardiac medications before your next appointment, please call your pharmacy*   Lab Work: TODAY: BMET, Mg If you have labs (blood work) drawn today and your tests are completely normal, you will receive your results only by: Marland Kitchen MyChart Message (if you have MyChart) OR . A paper copy in the mail If you have any lab test that is abnormal or we need to change your treatment, we will call you to review the results.  Follow-Up: At [facility], you and your health needs are our priority.  As part of our continuing mission to provide you with exceptional heart care, we have created designated Provider Care Teams.  These Care Teams include your primary Cardiologist (physician) and Advanced Practice Providers (APPs -  Physician Assistants and Nurse Practitioners) who all work together to provide you with the care you need, when you need it.    Your next appointment:   1 year(s)  The format for your next appointment:   In Person  Provider:   You may see Fransico Him, MD or one of the following Advanced Practice Providers on your designated Care Team:   Melina Copa, PA-C  Ermalinda Barrios, PA-C

## 2020-01-28 LAB — BASIC METABOLIC PANEL
BUN/Creatinine Ratio: 24 (ref 12–28)
BUN: 40 mg/dL — ABNORMAL HIGH (ref 8–27)
CO2: 32 mmol/L — ABNORMAL HIGH (ref 20–29)
Calcium: 9.3 mg/dL (ref 8.7–10.3)
Chloride: 101 mmol/L (ref 96–106)
Creatinine, Ser: 1.64 mg/dL — ABNORMAL HIGH (ref 0.57–1.00)
GFR calc Af Amer: 34 mL/min/{1.73_m2} — ABNORMAL LOW (ref >59)
GFR calc non Af Amer: 30 mL/min/{1.73_m2} — ABNORMAL LOW (ref >59)
Glucose: 90 mg/dL (ref 65–99)
Potassium: 4.4 mmol/L (ref 3.5–5.2)
Sodium: 143 mmol/L (ref 134–144)

## 2020-01-28 LAB — MAGNESIUM: Magnesium: 1.6 mg/dL (ref 1.6–2.3)

## 2020-01-29 DIAGNOSIS — S2242XS Multiple fractures of ribs, left side, sequela: Secondary | ICD-10-CM | POA: Diagnosis not present

## 2020-01-29 DIAGNOSIS — I5042 Chronic combined systolic (congestive) and diastolic (congestive) heart failure: Secondary | ICD-10-CM | POA: Diagnosis not present

## 2020-01-29 DIAGNOSIS — J449 Chronic obstructive pulmonary disease, unspecified: Secondary | ICD-10-CM | POA: Diagnosis not present

## 2020-02-02 ENCOUNTER — Other Ambulatory Visit: Payer: Self-pay

## 2020-02-02 DIAGNOSIS — R790 Abnormal level of blood mineral: Secondary | ICD-10-CM

## 2020-02-02 DIAGNOSIS — E782 Mixed hyperlipidemia: Secondary | ICD-10-CM

## 2020-02-02 MED ORDER — MAGNESIUM OXIDE 250 MG PO TABS: 1.0000 | ORAL_TABLET | Freq: Every day | ORAL | 3 refills | Status: DC

## 2020-02-14 ENCOUNTER — Other Ambulatory Visit: Payer: Self-pay | Admitting: Family

## 2020-02-14 DIAGNOSIS — J4520 Mild intermittent asthma, uncomplicated: Secondary | ICD-10-CM | POA: Diagnosis not present

## 2020-02-14 DIAGNOSIS — R0902 Hypoxemia: Secondary | ICD-10-CM | POA: Diagnosis not present

## 2020-02-15 ENCOUNTER — Other Ambulatory Visit: Payer: Self-pay

## 2020-02-15 ENCOUNTER — Inpatient Hospital Stay: Payer: Medicare HMO

## 2020-02-15 VITALS — BP 126/57 | HR 69 | Temp 98.3°F | Resp 22

## 2020-02-15 DIAGNOSIS — R42 Dizziness and giddiness: Secondary | ICD-10-CM | POA: Diagnosis not present

## 2020-02-15 DIAGNOSIS — D631 Anemia in chronic kidney disease: Secondary | ICD-10-CM

## 2020-02-15 DIAGNOSIS — N184 Chronic kidney disease, stage 4 (severe): Secondary | ICD-10-CM

## 2020-02-15 DIAGNOSIS — K5900 Constipation, unspecified: Secondary | ICD-10-CM | POA: Diagnosis not present

## 2020-02-15 DIAGNOSIS — Z79899 Other long term (current) drug therapy: Secondary | ICD-10-CM | POA: Diagnosis not present

## 2020-02-15 DIAGNOSIS — E611 Iron deficiency: Secondary | ICD-10-CM | POA: Diagnosis not present

## 2020-02-15 DIAGNOSIS — R5383 Other fatigue: Secondary | ICD-10-CM | POA: Diagnosis not present

## 2020-02-15 DIAGNOSIS — M7989 Other specified soft tissue disorders: Secondary | ICD-10-CM | POA: Diagnosis not present

## 2020-02-15 DIAGNOSIS — R0602 Shortness of breath: Secondary | ICD-10-CM | POA: Diagnosis not present

## 2020-02-15 DIAGNOSIS — N183 Chronic kidney disease, stage 3 unspecified: Secondary | ICD-10-CM | POA: Diagnosis not present

## 2020-02-15 LAB — CBC WITH DIFFERENTIAL (CANCER CENTER ONLY)
Abs Immature Granulocytes: 0.03 10*3/uL (ref 0.00–0.07)
Basophils Absolute: 0.1 10*3/uL (ref 0.0–0.1)
Basophils Relative: 1 %
Eosinophils Absolute: 0.3 10*3/uL (ref 0.0–0.5)
Eosinophils Relative: 5 %
HCT: 34.3 % — ABNORMAL LOW (ref 36.0–46.0)
Hemoglobin: 10.7 g/dL — ABNORMAL LOW (ref 12.0–15.0)
Immature Granulocytes: 1 %
Lymphocytes Relative: 17 %
Lymphs Abs: 1 10*3/uL (ref 0.7–4.0)
MCH: 31.2 pg (ref 26.0–34.0)
MCHC: 31.2 g/dL (ref 30.0–36.0)
MCV: 100 fL (ref 80.0–100.0)
Monocytes Absolute: 0.7 10*3/uL (ref 0.1–1.0)
Monocytes Relative: 11 %
Neutro Abs: 4.1 10*3/uL (ref 1.7–7.7)
Neutrophils Relative %: 65 %
Platelet Count: 127 10*3/uL — ABNORMAL LOW (ref 150–400)
RBC: 3.43 MIL/uL — ABNORMAL LOW (ref 3.87–5.11)
RDW: 13.4 % (ref 11.5–15.5)
WBC Count: 6.2 10*3/uL (ref 4.0–10.5)
nRBC: 0 % (ref 0.0–0.2)

## 2020-02-15 LAB — CMP (CANCER CENTER ONLY)
ALT: 8 U/L (ref 0–44)
AST: 11 U/L — ABNORMAL LOW (ref 15–41)
Albumin: 4.3 g/dL (ref 3.5–5.0)
Alkaline Phosphatase: 47 U/L (ref 38–126)
Anion gap: 8 (ref 5–15)
BUN: 50 mg/dL — ABNORMAL HIGH (ref 8–23)
CO2: 34 mmol/L — ABNORMAL HIGH (ref 22–32)
Calcium: 10 mg/dL (ref 8.9–10.3)
Chloride: 98 mmol/L (ref 98–111)
Creatinine: 1.72 mg/dL — ABNORMAL HIGH (ref 0.44–1.00)
GFR, Estimated: 30 mL/min — ABNORMAL LOW (ref >60)
Glucose, Bld: 74 mg/dL (ref 70–99)
Potassium: 4.6 mmol/L (ref 3.5–5.1)
Sodium: 140 mmol/L (ref 135–145)
Total Bilirubin: 0.6 mg/dL (ref 0.3–1.2)
Total Protein: 6.9 g/dL (ref 6.5–8.1)

## 2020-02-15 MED ORDER — EPOETIN ALFA-EPBX 40000 UNIT/ML IJ SOLN
40000.0000 [IU] | Freq: Once | INTRAMUSCULAR | Status: AC
  Administered 2020-02-15: 40000 [IU] via SUBCUTANEOUS

## 2020-02-15 MED ORDER — EPOETIN ALFA-EPBX 40000 UNIT/ML IJ SOLN
INTRAMUSCULAR | Status: AC
  Filled 2020-02-15: qty 1

## 2020-02-15 NOTE — Patient Instructions (Signed)

## 2020-02-23 ENCOUNTER — Other Ambulatory Visit: Payer: Self-pay

## 2020-02-23 ENCOUNTER — Encounter: Payer: Self-pay | Admitting: Family Medicine

## 2020-02-23 ENCOUNTER — Ambulatory Visit (INDEPENDENT_AMBULATORY_CARE_PROVIDER_SITE_OTHER): Payer: Medicare HMO | Admitting: Family Medicine

## 2020-02-23 VITALS — BP 116/64 | HR 81 | Temp 98.3°F | Resp 18 | Wt 185.3 lb

## 2020-02-23 DIAGNOSIS — K5900 Constipation, unspecified: Secondary | ICD-10-CM

## 2020-02-23 DIAGNOSIS — L578 Other skin changes due to chronic exposure to nonionizing radiation: Secondary | ICD-10-CM

## 2020-02-23 DIAGNOSIS — Z7289 Other problems related to lifestyle: Secondary | ICD-10-CM | POA: Diagnosis not present

## 2020-02-23 DIAGNOSIS — N184 Chronic kidney disease, stage 4 (severe): Secondary | ICD-10-CM

## 2020-02-23 DIAGNOSIS — E870 Hyperosmolality and hypernatremia: Secondary | ICD-10-CM | POA: Diagnosis not present

## 2020-02-23 DIAGNOSIS — E559 Vitamin D deficiency, unspecified: Secondary | ICD-10-CM | POA: Diagnosis not present

## 2020-02-23 DIAGNOSIS — M109 Gout, unspecified: Secondary | ICD-10-CM | POA: Diagnosis not present

## 2020-02-23 DIAGNOSIS — E118 Type 2 diabetes mellitus with unspecified complications: Secondary | ICD-10-CM | POA: Diagnosis not present

## 2020-02-23 DIAGNOSIS — H3530 Unspecified macular degeneration: Secondary | ICD-10-CM

## 2020-02-23 DIAGNOSIS — R7989 Other specified abnormal findings of blood chemistry: Secondary | ICD-10-CM

## 2020-02-23 DIAGNOSIS — E119 Type 2 diabetes mellitus without complications: Secondary | ICD-10-CM | POA: Diagnosis not present

## 2020-02-23 DIAGNOSIS — E782 Mixed hyperlipidemia: Secondary | ICD-10-CM | POA: Diagnosis not present

## 2020-02-23 DIAGNOSIS — J449 Chronic obstructive pulmonary disease, unspecified: Secondary | ICD-10-CM | POA: Diagnosis not present

## 2020-02-23 DIAGNOSIS — I10 Essential (primary) hypertension: Secondary | ICD-10-CM

## 2020-02-23 DIAGNOSIS — R5381 Other malaise: Secondary | ICD-10-CM

## 2020-02-23 NOTE — Patient Instructions (Addendum)
Benefiber twice d day for constipation   Constipation, Adult Constipation is when a person has fewer than three bowel movements in a week, has difficulty having a bowel movement, or has stools (feces) that are dry, hard, or larger than normal. Constipation may be caused by an underlying condition. It may become worse with age if a person takes certain medicines and does not take in enough fluids. Follow these instructions at home: Eating and drinking  Eat foods that have a lot of fiber, such as beans, whole grains, and fresh fruits and vegetables.  Limit foods that are low in fiber and high in fat and processed sugars, such as fried or sweet foods. These include french fries, hamburgers, cookies, candies, and soda.  Drink enough fluid to keep your urine pale yellow.   General instructions  Exercise regularly or as told by your health care provider. Try to do 150 minutes of moderate exercise each week.  Use the bathroom when you have the urge to go. Do not hold it in.  Take over-the-counter and prescription medicines only as told by your health care provider. This includes any fiber supplements.  During bowel movements: ? Practice deep breathing while relaxing the lower abdomen. ? Practice pelvic floor relaxation.  Watch your condition for any changes. Let your health care provider know about them.  Keep all follow-up visits as told by your health care provider. This is important. Contact a health care provider if:  You have pain that gets worse.  You have a fever.  You do not have a bowel movement after 4 days.  You vomit.  You are not hungry or you lose weight.  You are bleeding from the opening between the buttocks (anus).  You have thin, pencil-like stools. Get help right away if:  You have a fever and your symptoms suddenly get worse.  You leak stool or have blood in your stool.  Your abdomen is bloated.  You have severe pain in your abdomen.  You feel dizzy or  you faint. Summary  Constipation is when a person has fewer than three bowel movements in a week, has difficulty having a bowel movement, or has stools (feces) that are dry, hard, or larger than normal.  Eat foods that have a lot of fiber, such as beans, whole grains, and fresh fruits and vegetables.  Drink enough fluid to keep your urine pale yellow.  Take over-the-counter and prescription medicines only as told by your health care provider. This includes any fiber supplements. This information is not intended to replace advice given to you by your health care provider. Make sure you discuss any questions you have with your health care provider. Document Revised: 11/24/2018 Document Reviewed: 11/24/2018 Elsevier Patient Education  [place].

## 2020-02-24 LAB — COMPREHENSIVE METABOLIC PANEL
ALT: 6 U/L (ref 0–35)
AST: 11 U/L (ref 0–37)
Albumin: 4.1 g/dL (ref 3.5–5.2)
Alkaline Phosphatase: 48 U/L (ref 39–117)
BUN: 47 mg/dL — ABNORMAL HIGH (ref 6–23)
CO2: 37 mEq/L — ABNORMAL HIGH (ref 19–32)
Calcium: 9.9 mg/dL (ref 8.4–10.5)
Chloride: 99 mEq/L (ref 96–112)
Creatinine, Ser: 1.6 mg/dL — ABNORMAL HIGH (ref 0.40–1.20)
GFR: 30.62 mL/min — ABNORMAL LOW (ref >60.00)
Glucose, Bld: 96 mg/dL (ref 70–99)
Potassium: 4.3 mEq/L (ref 3.5–5.1)
Sodium: 142 mEq/L (ref 135–145)
Total Bilirubin: 0.6 mg/dL (ref 0.2–1.2)
Total Protein: 6.6 g/dL (ref 6.0–8.3)

## 2020-02-24 LAB — LIPID PANEL
Cholesterol: 124 mg/dL (ref 0–200)
HDL: 48.2 mg/dL (ref >39.00)
LDL Cholesterol: 50 mg/dL (ref 0–99)
NonHDL: 75.73
Total CHOL/HDL Ratio: 3
Triglycerides: 127 mg/dL (ref 0.0–149.0)
VLDL: 25.4 mg/dL (ref 0.0–40.0)

## 2020-02-24 LAB — CBC WITH DIFFERENTIAL/PLATELET
Basophils Absolute: 0 10*3/uL (ref 0.0–0.1)
Basophils Relative: 0.5 % (ref 0.0–3.0)
Eosinophils Absolute: 0.3 10*3/uL (ref 0.0–0.7)
Eosinophils Relative: 4.8 % (ref 0.0–5.0)
HCT: 35.7 % — ABNORMAL LOW (ref 36.0–46.0)
Hemoglobin: 11.5 g/dL — ABNORMAL LOW (ref 12.0–15.0)
Lymphocytes Relative: 12.8 % (ref 12.0–46.0)
Lymphs Abs: 0.8 10*3/uL (ref 0.7–4.0)
MCHC: 32.3 g/dL (ref 30.0–36.0)
MCV: 98.2 fl (ref 78.0–100.0)
Monocytes Absolute: 0.7 10*3/uL (ref 0.1–1.0)
Monocytes Relative: 10.9 % (ref 3.0–12.0)
Neutro Abs: 4.3 10*3/uL (ref 1.4–7.7)
Neutrophils Relative %: 71 % (ref 43.0–77.0)
Platelets: 158 10*3/uL (ref 150.0–400.0)
RBC: 3.64 Mil/uL — ABNORMAL LOW (ref 3.87–5.11)
RDW: 15.6 % — ABNORMAL HIGH (ref 11.5–15.5)
WBC: 6 10*3/uL (ref 4.0–10.5)

## 2020-02-24 LAB — VITAMIN B12: Vitamin B-12: 1451 pg/mL — ABNORMAL HIGH (ref 211–911)

## 2020-02-24 LAB — MAGNESIUM: Magnesium: 1.7 mg/dL (ref 1.5–2.5)

## 2020-02-24 LAB — MICROALBUMIN / CREATININE URINE RATIO
Creatinine,U: 72.7 mg/dL
Microalb Creat Ratio: 1 mg/g (ref 0.0–30.0)
Microalb, Ur: <0.7 mg/dL (ref 0.0–1.9)

## 2020-02-24 LAB — HEPATITIS C ANTIBODY
Hepatitis C Ab: NONREACTIVE
SIGNAL TO CUT-OFF: 0.01 (ref <1.00)

## 2020-02-24 LAB — URIC ACID: Uric Acid, Serum: 5 mg/dL (ref 2.4–7.0)

## 2020-02-24 LAB — HEMOGLOBIN A1C: Hgb A1c MFr Bld: 5.5 % (ref 4.6–6.5)

## 2020-02-24 LAB — VITAMIN D 25 HYDROXY (VIT D DEFICIENCY, FRACTURES): VITD: 43.67 ng/mL (ref 30.00–100.00)

## 2020-02-26 DIAGNOSIS — M1990 Unspecified osteoarthritis, unspecified site: Secondary | ICD-10-CM | POA: Diagnosis not present

## 2020-02-26 DIAGNOSIS — M109 Gout, unspecified: Secondary | ICD-10-CM | POA: Diagnosis not present

## 2020-02-26 DIAGNOSIS — I739 Peripheral vascular disease, unspecified: Secondary | ICD-10-CM | POA: Diagnosis not present

## 2020-02-26 DIAGNOSIS — R5381 Other malaise: Secondary | ICD-10-CM | POA: Insufficient documentation

## 2020-02-26 DIAGNOSIS — J4540 Moderate persistent asthma, uncomplicated: Secondary | ICD-10-CM | POA: Diagnosis not present

## 2020-02-26 DIAGNOSIS — J449 Chronic obstructive pulmonary disease, unspecified: Secondary | ICD-10-CM | POA: Diagnosis not present

## 2020-02-26 DIAGNOSIS — I5042 Chronic combined systolic (congestive) and diastolic (congestive) heart failure: Secondary | ICD-10-CM | POA: Diagnosis not present

## 2020-02-26 DIAGNOSIS — N184 Chronic kidney disease, stage 4 (severe): Secondary | ICD-10-CM | POA: Diagnosis not present

## 2020-02-26 DIAGNOSIS — D631 Anemia in chronic kidney disease: Secondary | ICD-10-CM | POA: Diagnosis not present

## 2020-02-26 DIAGNOSIS — I130 Hypertensive heart and chronic kidney disease with heart failure and stage 1 through stage 4 chronic kidney disease, or unspecified chronic kidney disease: Secondary | ICD-10-CM | POA: Diagnosis not present

## 2020-02-26 NOTE — Assessment & Plan Note (Signed)
Referred to home Health care for physical therapy

## 2020-02-26 NOTE — Assessment & Plan Note (Signed)
Tolerating statin, encouraged heart healthy diet, avoid trans fats, minimize simple carbs and saturated fats. Increase exercise as tolerated 

## 2020-02-26 NOTE — Assessment & Plan Note (Signed)
Hydrate and continue Allopurinol, asymptomatic

## 2020-02-26 NOTE — Assessment & Plan Note (Signed)
Supplement and monitor 

## 2020-02-26 NOTE — Assessment & Plan Note (Signed)
Encouraged increased hydration and fiber in diet. Daily probiotics. If bowels not moving can use MOM 2 tbls po in 4 oz of warm prune juice by mouth every 2-3 days. If no results then repeat in 4 hours with  Dulcolax suppository pr, may repeat again in 4 more hours as needed. Seek care if symptoms worsen. Consider daily Miralax and/or Dulcolax if symptoms persist. Consider Miralax and benefiber today daily.

## 2020-02-26 NOTE — Assessment & Plan Note (Signed)
hgba1c acceptable, minimize simple carbs. Increase exercise as tolerated. Continue current meds 

## 2020-02-26 NOTE — Assessment & Plan Note (Signed)
Hydrate and monitor 

## 2020-02-26 NOTE — Assessment & Plan Note (Signed)
Well controlled, no changes to meds. Encouraged heart healthy diet such as the DASH diet and exercise as tolerated.  °

## 2020-02-26 NOTE — Progress Notes (Signed)
Subjective:    Patient ID: [name], female    DOB: 1940/07/02, 80 y.o.   MRN: [id number]  Chief Complaint  Patient presents with  . Follow-up    HPI Patient is in today for follow up on chronic medical concerns. No recent febrile illness or hospitalizations. No polyuria or polydipsia. Tries to maintain a heart healthy diet and to minimize carbohydrates. Denies CP/palp/SOB/HA/congestion/fevers/GI or GU c/o. Taking meds as prescribed  Past Medical History:  Diagnosis Date  . Abnormal glucose tolerance test   . Abnormal TSH 09/22/2016  . ACE-inhibitor cough   . Anemia 03/12/2014  . Arthritis   . Asthma    PFT 02/06/09 FEV1 1.41 (65%), FVC 1.92 (64), FEV1% 74, TLC 3.47 (71%), DLCO 48%, +BD  . Atypical chest pain    s/p cath, Normal coronaries, Non ST elevation myocardial infarction, Rt groin pseudoaneurysm  . Bacterial vaginosis 03/12/2014  . Cellulitis 06/22/2016  . Chronic kidney disease (CKD), stage III (moderate) ([place]) 08/04/2016  . COPD (chronic obstructive pulmonary disease) ([place])   . Depression   . Diastolic heart failure ([place]) 04/07/2016  . DVT (deep venous thrombosis) ([place]) 1987  . GERD (gastroesophageal reflux disease)   . Gout   . Hypercalcemia 10/15/2014  . Hyperlipidemia   . Hypertension   . Hypoxia 10/15/2014  . Macular degeneration 04/10/2015  . Osteopenia 12/29/2006   Qualifier: Diagnosis of  By: Wynona Luna   . Pneumonia   . Polymyalgia rheumatica ([place]) 01/07/2016  . Renal insufficiency 09/22/2016  . Unspecified constipation 06/05/2013  . Vitamin D deficiency 01/01/2015    Past Surgical History:  Procedure Laterality Date  . APPENDECTOMY  1951  . TONSILLECTOMY  1950  . TUBAL LIGATION  1968    Family History  Problem Relation Age of Onset  . Asthma Sister   . Hypertension Sister   . Hyperlipidemia Sister   . Uterine cancer Sister   . Coronary artery disease Brother        x2  . Arthritis Brother   . Lung cancer Brother        smoker  .  Hypertension Sister   . Arthritis Sister   . Hyperlipidemia Sister   . Emphysema Sister   . COPD Sister        smoker  . Heart disease Sister   . Hyperlipidemia Sister   . Hypertension Sister   . Arthritis Sister   . Emphysema Brother   . Heart disease Brother         smoker  . Heart attack Brother   . Mental illness Father   . Suicidality Father   . Heart disease Mother   . Hyperlipidemia Mother   . Heart attack Mother   . Epilepsy Daughter   . Hypertension Daughter   . Obesity Daughter   . COPD Brother        smoker  . Lung cancer Brother   . Coronary artery disease Other   . Colon polyps Sister     Social History   Socioeconomic History  . Marital status: Widowed    Spouse name: Not on file  . Number of children: 1  . Years of education: Not on file  . Highest education level: Not on file  Occupational History  . Occupation: Retired    Fish farm manager: [employer]  Tobacco Use  . Smoking status: Never Smoker  . Smokeless tobacco: Never Used  Vaping Use  . Vaping Use: Never used  Substance and Sexual  Activity  . Alcohol use: No  . Drug use: No  . Sexual activity: Never    Comment: lives alone, no dietary restrictions  Other Topics Concern  . Not on file  Social History Narrative  . Not on file   Social Determinants of Health   Financial Resource Strain: Not on file  Food Insecurity: Not on file  Transportation Needs: Not on file  Physical Activity: Not on file  Stress: Not on file  Social Connections: Not on file  Intimate Partner Violence: Not on file    Outpatient Medications Prior to Visit  Medication Sig Dispense Refill  . Accu-Chek Softclix Lancets lancets CHECK BLOOD SUGAR ONE TIME WEEKLY 50 each 12  . acetaminophen (TYLENOL) 500 MG tablet Take 500 mg by mouth 2 (two) times daily.     Marland Kitchen albuterol (ACCUNEB) 0.63 MG/3ML nebulizer solution Take 3 mLs (0.63 mg total) by nebulization every 6 (six) hours as needed for wheezing or shortness of breath. 150  mL 3  . albuterol (PROVENTIL) (2.5 MG/3ML) 0.083% nebulizer solution     . albuterol (VENTOLIN HFA) 108 (90 Base) MCG/ACT inhaler Inhale 2 puffs into the lungs every 4 (four) hours as needed for wheezing or shortness of breath. 54 g 3  . Alcohol Swabs (ALCOHOL WIPES) 70 % PADS To use before checking blood sugars 100 each 1  . allopurinol (ZYLOPRIM) 100 MG tablet TAKE 2 TABLETS ONE TIME DAILY 180 tablet 1  . b complex vitamins tablet Take 1 tablet by mouth daily.    . benzonatate (TESSALON) 100 MG capsule Take 1 capsule (100 mg total) by mouth 2 (two) times daily as needed for cough. 20 capsule 1  . Blood Glucose Calibration (ACCU-CHEK AVIVA) SOLN     . blood glucose meter kit and supplies Check blood sugars once weekly. 1 each 0  . budesonide-formoterol (SYMBICORT) 160-4.5 MCG/ACT inhaler Inhale 2 puffs into the lungs in the morning and at bedtime. 30.6 g 3  . docusate sodium (COLACE) 100 MG capsule Take 1 capsule (100 mg total) by mouth 2 (two) times daily. 10 capsule 0  . Ferrous Fumarate-Folic Acid (HEMOCYTE-F) 324-1 MG TABS Take 1 tablet by mouth daily. 90 each 1  . fluticasone (FLONASE) 50 MCG/ACT nasal spray Place 2 sprays into both nostrils daily. 48 g 1  . furosemide (LASIX) 40 MG tablet TAKE 1 TABLET DAILY AND A SECOND TABLET DAILY AS NEEDED FOR INCREASED EDEMA OR WEIGHT GAIN GREATER THAN 3 POUNDS IN 24 HOURS 100 tablet 5  . glucose blood (ACCU-CHEK AVIVA PLUS) test strip CHECK BLOOD SUGAR ONE TIME WEEKLY 50 strip 12  . loratadine (CLARITIN) 10 MG tablet Take 1 tablet (10 mg total) by mouth at bedtime. (Patient taking differently: Take 10 mg by mouth at bedtime as needed for allergies.) 30 tablet 5  . Magnesium Oxide 250 MG TABS Take 1 tablet (250 mg total) by mouth daily. 30 tablet 3  . metoprolol succinate (TOPROL-XL) 50 MG 24 hr tablet Take 1.5 tablets (75 mg total) by mouth daily. Take with or immediately following a meal. 135 tablet 3  . montelukast (SINGULAIR) 10 MG tablet Take 1  tablet (10 mg total) by mouth at bedtime. 90 tablet 2  . OXYGEN Inhale 2 L into the lungs continuous.     . pantoprazole (PROTONIX) 40 MG tablet Take 40 mg by mouth 2 (two) times daily.    . polyethylene glycol (MIRALAX / GLYCOLAX) 17 g packet Take 17 g by mouth daily. [place]  each 0  . pravastatin (PRAVACHOL) 40 MG tablet Take 1 tablet (40 mg total) by mouth every evening. 90 tablet 1  . Probiotic Product (PROBIOTIC PO) Take 1 tablet by mouth daily.     . sertraline (ZOLOFT) 100 MG tablet Take 1 tablet (100 mg total) by mouth daily. 90 tablet 1  . traMADol (ULTRAM) 50 MG tablet TAKE 1 TABLET BY MOUTH THREE TIMES DAILY AS NEEDED FOR MODERATE PAIN 90 tablet 0  . trolamine salicylate (ASPERCREME) 10 % cream Apply 1 application topically as needed for muscle pain.    . vitamin B-12 (CYANOCOBALAMIN) 1000 MCG tablet Take 1,000 mcg by mouth daily.      No facility-administered medications prior to visit.    Allergies  Allergen Reactions  . Montelukast Sodium Palpitations and Other (See Comments)    REACTION: HEART PALPITATIONS, CHEST PAIN.  Marland Kitchen Sulfa Antibiotics Other (See Comments)    CHEST PAIN  . Sulfonamide Derivatives Other (See Comments)    CHEST PAIN  . Oseltamivir Phosphate Diarrhea, Nausea And Vomiting and Other (See Comments)    TAMIFLU REACTION:  dizziness  . Ace Inhibitors Other (See Comments)    PT. STATED UNKNOWN REACTION  . Indomethacin Other (See Comments)    PT. STATED UNKNOWN REACTION    Review of Systems  Constitutional: Negative for fever and malaise/fatigue.  HENT: Negative for congestion.   Eyes: Negative for blurred vision.  Respiratory: Negative for shortness of breath.   Cardiovascular: Negative for chest pain, palpitations and leg swelling.  Gastrointestinal: Negative for abdominal pain, blood in stool and nausea.  Genitourinary: Negative for dysuria and frequency.  Musculoskeletal: Negative for falls.  Skin: Negative for rash.  Neurological: Negative for  dizziness, loss of consciousness and headaches.  Endo/Heme/Allergies: Negative for environmental allergies.  Psychiatric/Behavioral: Negative for depression. The patient is not nervous/anxious.        Objective:    Physical Exam Vitals and nursing note reviewed.  Constitutional:      General: She is not in acute distress.    Appearance: She is well-developed and well-nourished.  HENT:     Head: Normocephalic and atraumatic.     Nose: Nose normal.  Eyes:     General:        Right eye: No discharge.        Left eye: No discharge.  Cardiovascular:     Rate and Rhythm: Normal rate and regular rhythm.     Heart sounds: No murmur heard.   Pulmonary:     Effort: Pulmonary effort is normal.     Breath sounds: Normal breath sounds.  Abdominal:     General: Bowel sounds are normal.     Palpations: Abdomen is soft.     Tenderness: There is no abdominal tenderness.  Musculoskeletal:     Cervical back: Normal range of motion and neck supple.     Right lower leg: Edema present.     Left lower leg: Edema present.  Skin:    General: Skin is warm and dry.  Neurological:     Mental Status: She is alert and oriented to person, place, and time.  Psychiatric:        Mood and Affect: Mood and affect normal.     BP 116/64   Pulse 81   Temp 98.3 F (36.8 C)   Resp 18   Wt 185 lb 4.8 oz (84.1 kg)   SpO2 97%   BMI 31.81 kg/m  Wt Readings from Last 3 Encounters:  02/23/20 185 lb 4.8 oz (84.1 kg)  01/27/20 188 lb 12.8 oz (85.6 kg)  01/25/20 185 lb 6.4 oz (84.1 kg)    Diabetic Foot Exam - Simple   Simple Foot Form Visual Inspection No deformities, no ulcerations, no other skin breakdown bilaterally: Yes Sensation Testing Intact to touch and monofilament testing bilaterally: Yes Pulse Check Posterior Tibialis and Dorsalis pulse intact bilaterally: Yes Comments    Lab Results  Component Value Date   WBC 6.0 02/23/2020   HGB 11.5 (L) 02/23/2020   HCT 35.7 (L) 02/23/2020    PLT 158.0 02/23/2020   GLUCOSE 96 02/23/2020   CHOL 124 02/23/2020   TRIG 127.0 02/23/2020   HDL 48.20 02/23/2020   LDLDIRECT 73.0 06/16/2017   LDLCALC 50 02/23/2020   ALT 6 02/23/2020   AST 11 02/23/2020   NA 142 02/23/2020   K 4.3 02/23/2020   CL 99 02/23/2020   CREATININE 1.60 (H) 02/23/2020   BUN 47 (H) 02/23/2020   CO2 37 (H) 02/23/2020   TSH 4.79 (H) 07/06/2019   INR 1.0 09/12/2006   HGBA1C 5.5 02/23/2020   MICROALBUR <0.7 02/23/2020    Lab Results  Component Value Date   TSH 4.79 (H) 07/06/2019   Lab Results  Component Value Date   WBC 6.0 02/23/2020   HGB 11.5 (L) 02/23/2020   HCT 35.7 (L) 02/23/2020   MCV 98.2 02/23/2020   PLT 158.0 02/23/2020   Lab Results  Component Value Date   NA 142 02/23/2020   K 4.3 02/23/2020   CO2 37 (H) 02/23/2020   GLUCOSE 96 02/23/2020   BUN 47 (H) 02/23/2020   CREATININE 1.60 (H) 02/23/2020   BILITOT 0.6 02/23/2020   ALKPHOS 48 02/23/2020   AST 11 02/23/2020   ALT 6 02/23/2020   PROT 6.6 02/23/2020   ALBUMIN 4.1 02/23/2020   CALCIUM 9.9 02/23/2020   ANIONGAP 8 02/15/2020   GFR 30.62 (L) 02/23/2020   Lab Results  Component Value Date   CHOL 124 02/23/2020   Lab Results  Component Value Date   HDL 48.20 02/23/2020   Lab Results  Component Value Date   LDLCALC 50 02/23/2020   Lab Results  Component Value Date   TRIG 127.0 02/23/2020   Lab Results  Component Value Date   CHOLHDL 3 02/23/2020   Lab Results  Component Value Date   HGBA1C 5.5 02/23/2020       Assessment & Plan:   Problem List Items Addressed This Visit    Essential hypertension - Primary    Well controlled, no changes to meds. Encouraged heart healthy diet such as the DASH diet and exercise as tolerated.       Relevant Orders   CBC with Differential/Platelet (Completed)   Comprehensive metabolic panel (Completed)   Magnesium (Completed)   Diabetes type 2, controlled (HCC)    hgba1c acceptable, minimize simple carbs. Increase  exercise as tolerated. Continue current meds      Relevant Orders   Magnesium (Completed)   Microalbumin / creatinine urine ratio (Completed)   Hyperlipidemia, mixed    Tolerating statin, encouraged heart healthy diet, avoid trans fats, minimize simple carbs and saturated fats. 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 Daily probiotics. If bowels not moving can use MOM 2 tbls po in 4 oz of warm prune juice by mouth every 2-3 days. If no results then repeat in 4 hours with  Dulcolax suppository pr, may repeat again in 4 more hours as needed. Seek care if symptoms worsen. Consider daily Miralax and/or Dulcolax if symptoms persist. Consider Miralax and benefiber today daily.       Macular degeneration   Relevant Orders   Ambulatory referral to Ophthalmology   Abnormal TSH   Hypernatremia   Vitamin D deficiency    Supplement and monitor      Relevant Orders   VITAMIN D 25 Hydroxy (Vit-D Deficiency, Fractures) (Completed)   High serum vitamin B12   Relevant Orders   Vitamin B12 (Completed)   Physical deconditioning     Referred to home Health care for physical therapy      Relevant Orders   Ambulatory referral to [facility]    Other Visit Diagnoses    Other problems related to lifestyle       Relevant Orders   Hepatitis C antibody (Completed)      I am having [name] "Sarahs" maintain her acetaminophen, vitamin B-12, OXYGEN, b complex vitamins, Probiotic Product (PROBIOTIC PO), loratadine, Ferrous Fumarate-Folic Acid, trolamine salicylate, blood glucose meter kit and supplies, Alcohol Wipes,  Accu-Chek Aviva, pantoprazole, docusate sodium, polyethylene glycol, montelukast, pravastatin, metoprolol succinate, albuterol, Accu-Chek Softclix Lancets, budesonide-formoterol, Accu-Chek Aviva Plus, sertraline, fluticasone, albuterol, allopurinol, benzonatate, albuterol, furosemide, traMADol, and Magnesium Oxide.  No orders of the defined types were placed in this encounter.    Penni Homans, MD

## 2020-02-27 ENCOUNTER — Other Ambulatory Visit: Payer: Self-pay | Admitting: Family Medicine

## 2020-02-27 NOTE — Telephone Encounter (Signed)
Requesting: tramadol Contract: none (WILL GET AT NEXT VISIT) UDS: 12/23/16 (WILL GET AT NEXT VISIT) Last Visit:  02/23/20 Next Visit: 06/27/19 Last Refill: 01/10/20  Please Advise

## 2020-03-02 ENCOUNTER — Other Ambulatory Visit: Payer: Medicare HMO

## 2020-03-05 DIAGNOSIS — J449 Chronic obstructive pulmonary disease, unspecified: Secondary | ICD-10-CM | POA: Diagnosis not present

## 2020-03-05 DIAGNOSIS — I739 Peripheral vascular disease, unspecified: Secondary | ICD-10-CM | POA: Diagnosis not present

## 2020-03-05 DIAGNOSIS — M1990 Unspecified osteoarthritis, unspecified site: Secondary | ICD-10-CM | POA: Diagnosis not present

## 2020-03-05 DIAGNOSIS — M109 Gout, unspecified: Secondary | ICD-10-CM | POA: Diagnosis not present

## 2020-03-05 DIAGNOSIS — I130 Hypertensive heart and chronic kidney disease with heart failure and stage 1 through stage 4 chronic kidney disease, or unspecified chronic kidney disease: Secondary | ICD-10-CM | POA: Diagnosis not present

## 2020-03-05 DIAGNOSIS — N184 Chronic kidney disease, stage 4 (severe): Secondary | ICD-10-CM | POA: Diagnosis not present

## 2020-03-05 DIAGNOSIS — I5042 Chronic combined systolic (congestive) and diastolic (congestive) heart failure: Secondary | ICD-10-CM | POA: Diagnosis not present

## 2020-03-05 DIAGNOSIS — J4540 Moderate persistent asthma, uncomplicated: Secondary | ICD-10-CM | POA: Diagnosis not present

## 2020-03-05 DIAGNOSIS — D631 Anemia in chronic kidney disease: Secondary | ICD-10-CM | POA: Diagnosis not present

## 2020-03-07 ENCOUNTER — Inpatient Hospital Stay: Payer: Medicare HMO | Attending: Family

## 2020-03-07 ENCOUNTER — Inpatient Hospital Stay (HOSPITAL_BASED_OUTPATIENT_CLINIC_OR_DEPARTMENT_OTHER): Payer: Medicare HMO | Admitting: Family

## 2020-03-07 ENCOUNTER — Other Ambulatory Visit: Payer: Self-pay

## 2020-03-07 ENCOUNTER — Encounter: Payer: Self-pay | Admitting: Family

## 2020-03-07 ENCOUNTER — Inpatient Hospital Stay: Payer: Medicare HMO

## 2020-03-07 VITALS — BP 114/49 | HR 66 | Temp 98.8°F | Resp 18 | Ht 63.0 in | Wt 186.0 lb

## 2020-03-07 DIAGNOSIS — N183 Chronic kidney disease, stage 3 unspecified: Secondary | ICD-10-CM | POA: Diagnosis not present

## 2020-03-07 DIAGNOSIS — D508 Other iron deficiency anemias: Secondary | ICD-10-CM

## 2020-03-07 DIAGNOSIS — D631 Anemia in chronic kidney disease: Secondary | ICD-10-CM

## 2020-03-07 DIAGNOSIS — N189 Chronic kidney disease, unspecified: Secondary | ICD-10-CM

## 2020-03-07 DIAGNOSIS — N184 Chronic kidney disease, stage 4 (severe): Secondary | ICD-10-CM

## 2020-03-07 LAB — CBC WITH DIFFERENTIAL (CANCER CENTER ONLY)
Abs Immature Granulocytes: 0.02 10*3/uL (ref 0.00–0.07)
Basophils Absolute: 0 10*3/uL (ref 0.0–0.1)
Basophils Relative: 1 %
Eosinophils Absolute: 0.3 10*3/uL (ref 0.0–0.5)
Eosinophils Relative: 5 %
HCT: 36.3 % (ref 36.0–46.0)
Hemoglobin: 11.1 g/dL — ABNORMAL LOW (ref 12.0–15.0)
Immature Granulocytes: 0 %
Lymphocytes Relative: 19 %
Lymphs Abs: 1 10*3/uL (ref 0.7–4.0)
MCH: 30.6 pg (ref 26.0–34.0)
MCHC: 30.6 g/dL (ref 30.0–36.0)
MCV: 100 fL (ref 80.0–100.0)
Monocytes Absolute: 0.6 10*3/uL (ref 0.1–1.0)
Monocytes Relative: 11 %
Neutro Abs: 3.4 10*3/uL (ref 1.7–7.7)
Neutrophils Relative %: 64 %
Platelet Count: 112 10*3/uL — ABNORMAL LOW (ref 150–400)
RBC: 3.63 MIL/uL — ABNORMAL LOW (ref 3.87–5.11)
RDW: 13.6 % (ref 11.5–15.5)
WBC Count: 5.3 10*3/uL (ref 4.0–10.5)
nRBC: 0 % (ref 0.0–0.2)

## 2020-03-07 LAB — RETICULOCYTES
Immature Retic Fract: 8.1 % (ref 2.3–15.9)
RBC.: 3.6 MIL/uL — ABNORMAL LOW (ref 3.87–5.11)
Retic Count, Absolute: 31.3 10*3/uL (ref 19.0–186.0)
Retic Ct Pct: 0.9 % (ref 0.4–3.1)

## 2020-03-07 LAB — CMP (CANCER CENTER ONLY)
ALT: 7 U/L (ref 0–44)
AST: 11 U/L — ABNORMAL LOW (ref 15–41)
Albumin: 4.3 g/dL (ref 3.5–5.0)
Alkaline Phosphatase: 40 U/L (ref 38–126)
Anion gap: 7 (ref 5–15)
BUN: 42 mg/dL — ABNORMAL HIGH (ref 8–23)
CO2: 37 mmol/L — ABNORMAL HIGH (ref 22–32)
Calcium: 10.2 mg/dL (ref 8.9–10.3)
Chloride: 100 mmol/L (ref 98–111)
Creatinine: 1.57 mg/dL — ABNORMAL HIGH (ref 0.44–1.00)
GFR, Estimated: 34 mL/min — ABNORMAL LOW (ref >60)
Glucose, Bld: 137 mg/dL — ABNORMAL HIGH (ref 70–99)
Potassium: 4.6 mmol/L (ref 3.5–5.1)
Sodium: 144 mmol/L (ref 135–145)
Total Bilirubin: 0.5 mg/dL (ref 0.3–1.2)
Total Protein: 6.9 g/dL (ref 6.5–8.1)

## 2020-03-07 NOTE — Progress Notes (Signed)
Hematology and Oncology Follow Up Visit  [name] [id number] Jan 30, 1940 80 y.o. 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 She is doing well and has no complaints at this time.  SOB has remained stable on 2L [place] supplemental O2.  No fever, chills, n/v, cough, rash, dizziness, chest pain, palpitations, abdominal pain or changes in bowel or bladder habits.   She has not noted any blood loss. No petechiae.  Chronic swelling in her lower extremities appears stable at this time. Pedal pulses are 2+.  No falls or syncope. She ambulates with a Rolator for added support.  No numbness or tingling in her extremities at this time.  She is eating well and staying hydrated. Her weight is stable at 186 lbs.    ECOG Performance Status: 1 - Symptomatic but completely ambulatory  Medications:  Allergies as of 03/07/2020      Reactions   Montelukast Sodium Palpitations, Other (See Comments)   REACTION: HEART PALPITATIONS, CHEST PAIN.   Sulfa Antibiotics Other (See Comments)   CHEST PAIN   Sulfonamide Derivatives Other (See Comments)   CHEST PAIN   Oseltamivir Phosphate Diarrhea, Nausea And Vomiting, Other (See Comments)   TAMIFLU REACTION:  dizziness   Ace Inhibitors Other (See Comments)   PT. STATED UNKNOWN REACTION   Indomethacin Other (See Comments)   PT. STATED UNKNOWN REACTION      Medication List       Accurate as of March 07, 2020  1:38 PM. If you have any questions, ask your nurse or doctor.        Accu-Chek Aviva Plus test strip Generic drug: glucose blood CHECK BLOOD SUGAR ONE TIME WEEKLY   Accu-Chek Aviva Soln   Accu-Chek Softclix Lancets lancets CHECK BLOOD SUGAR ONE TIME WEEKLY   acetaminophen 500 MG tablet Commonly known as: TYLENOL Take 500 mg by mouth 2 (two)  times daily.   albuterol 108 (90 Base) MCG/ACT inhaler Commonly known as: VENTOLIN HFA Inhale 2 puffs into the lungs every 4 (four) hours as needed for wheezing or shortness of breath.   albuterol 0.63 MG/3ML nebulizer solution Commonly known as: ACCUNEB Take 3 mLs (0.63 mg total) by nebulization every 6 (six) hours as needed for wheezing or shortness of breath.   albuterol (2.5 MG/3ML) 0.083% nebulizer solution Commonly known as: PROVENTIL   Alcohol Wipes 70 % Pads To use before checking blood sugars   allopurinol 100 MG tablet Commonly known as: ZYLOPRIM TAKE 2 TABLETS ONE TIME DAILY   b complex vitamins tablet Take 1 tablet by mouth daily.   benzonatate 100 MG capsule Commonly known as: TESSALON Take 1 capsule (100 mg total) by mouth 2 (two) times daily as needed for cough.   blood glucose meter kit and supplies Check blood sugars once weekly.   budesonide-formoterol 160-4.5 MCG/ACT inhaler Commonly known as: Symbicort Inhale 2 puffs into the lungs in the morning and at bedtime.   docusate sodium 100 MG capsule Commonly known as: COLACE Take 1 capsule (100 mg total) by mouth 2 (two) times daily.   Ferrous Fumarate-Folic Acid 409-8 MG Tabs Commonly known as: Hemocyte-F Take 1 tablet by mouth daily.   fluticasone 50 MCG/ACT nasal spray Commonly known as: FLONASE Place 2 sprays into both nostrils daily.   furosemide 40 MG tablet Commonly known  as: LASIX TAKE 1 TABLET DAILY AND A SECOND TABLET DAILY AS NEEDED FOR INCREASED EDEMA OR WEIGHT GAIN GREATER THAN 3 POUNDS IN 24 HOURS   loratadine 10 MG tablet Commonly known as: CLARITIN Take 1 tablet (10 mg total) by mouth at bedtime. What changed:   when to take this  reasons to take this   Magnesium Oxide 250 MG Tabs Take 1 tablet (250 mg total) by mouth daily.   metoprolol succinate 50 MG 24 hr tablet Commonly known as: TOPROL-XL Take 1.5 tablets (75 mg total) by mouth daily. Take with or immediately  following a meal.   montelukast 10 MG tablet Commonly known as: SINGULAIR Take 1 tablet (10 mg total) by mouth at bedtime.   OXYGEN Inhale 2 L into the lungs continuous.   pantoprazole 40 MG tablet Commonly known as: PROTONIX Take 40 mg by mouth 2 (two) times daily.   polyethylene glycol 17 g packet Commonly known as: MIRALAX / GLYCOLAX Take 17 g by mouth daily.   pravastatin 40 MG tablet Commonly known as: PRAVACHOL Take 1 tablet (40 mg total) by mouth every evening.   PROBIOTIC PO Take 1 tablet by mouth daily.   sertraline 100 MG tablet Commonly known as: ZOLOFT Take 1 tablet (100 mg total) by mouth daily.   traMADol 50 MG tablet Commonly known as: ULTRAM TAKE 1 TABLET BY MOUTH THREE TIMES DAILY AS NEEDED FOR MODERATE PAIN   trolamine salicylate 10 % cream Commonly known as: ASPERCREME Apply 1 application topically as needed for muscle pain.   vitamin B-12 1000 MCG tablet Commonly known as: CYANOCOBALAMIN Take 1,000 mcg by mouth daily.       Allergies:  Allergies  Allergen Reactions  . Montelukast Sodium Palpitations and Other (See Comments)    REACTION: HEART PALPITATIONS, CHEST PAIN.  Marland Kitchen Sulfa Antibiotics Other (See Comments)    CHEST PAIN  . Sulfonamide Derivatives Other (See Comments)    CHEST PAIN  . Oseltamivir Phosphate Diarrhea, Nausea And Vomiting and Other (See Comments)    TAMIFLU REACTION:  dizziness  . Ace Inhibitors Other (See Comments)    PT. STATED UNKNOWN REACTION  . Indomethacin Other (See Comments)    PT. STATED UNKNOWN REACTION    Past Medical History, Surgical history, Social history, and Family History were reviewed and updated.  Review of Systems: All other 10 point review of systems is negative.   Physical Exam:  vitals were not taken for this visit.   Wt Readings from Last 3 Encounters:  02/23/20 185 lb 4.8 oz (84.1 kg)  01/27/20 188 lb 12.8 oz (85.6 kg)  01/25/20 185 lb 6.4 oz (84.1 kg)    Ocular: Sclerae unicteric,  pupils equal, round and reactive to light Ear-nose-throat: Oropharynx clear, dentition fair Lymphatic: No cervical or supraclavicular adenopathy Lungs no rales or rhonchi, good excursion bilaterally Heart regular rate and rhythm, no murmur appreciated Abd soft, nontender, positive bowel sounds MSK no focal spinal tenderness, no joint edema Neuro: non-focal, well-oriented, appropriate affect Breasts: Deferred   Lab Results  Component Value Date   WBC 5.3 03/07/2020   HGB 11.1 (L) 03/07/2020   HCT 36.3 03/07/2020   MCV 100.0 03/07/2020   PLT 112 (L) 03/07/2020   Lab Results  Component Value Date   FERRITIN 358 (H) 01/25/2020   IRON 69 01/25/2020   TIBC 254 01/25/2020   UIBC 184 01/25/2020   IRONPCTSAT 27 01/25/2020   Lab Results  Component Value Date   RETICCTPCT 0.9 03/07/2020  RBC 3.60 (L) 03/07/2020   No results found for: KPAFRELGTCHN, LAMBDASER, KAPLAMBRATIO No results found for: IGGSERUM, IGA, IGMSERUM No results found for: Odetta Pink, SPEI   Chemistry      Component Value Date/Time   NA 142 02/23/2020 1605   NA 143 01/27/2020 1535   K 4.3 02/23/2020 1605   CL 99 02/23/2020 1605   CO2 37 (H) 02/23/2020 1605   BUN 47 (H) 02/23/2020 1605   BUN 40 (H) 01/27/2020 1535   CREATININE 1.60 (H) 02/23/2020 1605   CREATININE 1.72 (H) 02/15/2020 1408   CREATININE 1.36 (H) 08/23/2013 1134      Component Value Date/Time   CALCIUM 9.9 02/23/2020 1605   ALKPHOS 48 02/23/2020 1605   AST 11 02/23/2020 1605   AST 11 (L) 02/15/2020 1408   ALT 6 02/23/2020 1605   ALT 8 02/15/2020 1408   BILITOT 0.6 02/23/2020 1605   BILITOT 0.6 02/15/2020 1408       Impression and Plan: Ms. Vargo is a very pleasant 79yo caucasian female with multifactorial anemia. No ESA injection needed for Hgb 11.1.  Iron studies are pending. We will replace if needed.  Lab and injection in 3 weeks follow-up in 6 weeks.  She can contact our  office with any questions or concerns. We can certainly see her sooner if needed.   Laverna Peace, NP 2/16/20221:38 PM

## 2020-03-08 LAB — FERRITIN: Ferritin: 283 ng/mL (ref 11–307)

## 2020-03-08 LAB — IRON AND TIBC
Iron: 98 ug/dL (ref 41–142)
Saturation Ratios: 40 % (ref 21–57)
TIBC: 243 ug/dL (ref 236–444)
UIBC: 145 ug/dL (ref 120–384)

## 2020-03-13 ENCOUNTER — Other Ambulatory Visit: Payer: Self-pay | Admitting: Family Medicine

## 2020-03-14 ENCOUNTER — Other Ambulatory Visit: Payer: Self-pay | Admitting: *Deleted

## 2020-03-14 DIAGNOSIS — M1990 Unspecified osteoarthritis, unspecified site: Secondary | ICD-10-CM | POA: Diagnosis not present

## 2020-03-14 DIAGNOSIS — D631 Anemia in chronic kidney disease: Secondary | ICD-10-CM | POA: Diagnosis not present

## 2020-03-14 DIAGNOSIS — I5042 Chronic combined systolic (congestive) and diastolic (congestive) heart failure: Secondary | ICD-10-CM | POA: Diagnosis not present

## 2020-03-14 DIAGNOSIS — J449 Chronic obstructive pulmonary disease, unspecified: Secondary | ICD-10-CM | POA: Diagnosis not present

## 2020-03-14 DIAGNOSIS — I739 Peripheral vascular disease, unspecified: Secondary | ICD-10-CM | POA: Diagnosis not present

## 2020-03-14 DIAGNOSIS — I130 Hypertensive heart and chronic kidney disease with heart failure and stage 1 through stage 4 chronic kidney disease, or unspecified chronic kidney disease: Secondary | ICD-10-CM | POA: Diagnosis not present

## 2020-03-14 DIAGNOSIS — M109 Gout, unspecified: Secondary | ICD-10-CM | POA: Diagnosis not present

## 2020-03-14 DIAGNOSIS — N184 Chronic kidney disease, stage 4 (severe): Secondary | ICD-10-CM | POA: Diagnosis not present

## 2020-03-14 DIAGNOSIS — J4540 Moderate persistent asthma, uncomplicated: Secondary | ICD-10-CM | POA: Diagnosis not present

## 2020-03-14 MED ORDER — ACCU-CHEK AVIVA VI SOLN: 0 refills | Status: DC

## 2020-03-15 ENCOUNTER — Ambulatory Visit: Payer: Medicare HMO

## 2020-03-16 DIAGNOSIS — R0902 Hypoxemia: Secondary | ICD-10-CM | POA: Diagnosis not present

## 2020-03-16 DIAGNOSIS — J4520 Mild intermittent asthma, uncomplicated: Secondary | ICD-10-CM | POA: Diagnosis not present

## 2020-03-19 DIAGNOSIS — J4540 Moderate persistent asthma, uncomplicated: Secondary | ICD-10-CM | POA: Diagnosis not present

## 2020-03-19 DIAGNOSIS — I739 Peripheral vascular disease, unspecified: Secondary | ICD-10-CM | POA: Diagnosis not present

## 2020-03-19 DIAGNOSIS — N184 Chronic kidney disease, stage 4 (severe): Secondary | ICD-10-CM | POA: Diagnosis not present

## 2020-03-19 DIAGNOSIS — J449 Chronic obstructive pulmonary disease, unspecified: Secondary | ICD-10-CM | POA: Diagnosis not present

## 2020-03-19 DIAGNOSIS — I130 Hypertensive heart and chronic kidney disease with heart failure and stage 1 through stage 4 chronic kidney disease, or unspecified chronic kidney disease: Secondary | ICD-10-CM | POA: Diagnosis not present

## 2020-03-19 DIAGNOSIS — I5042 Chronic combined systolic (congestive) and diastolic (congestive) heart failure: Secondary | ICD-10-CM | POA: Diagnosis not present

## 2020-03-19 DIAGNOSIS — M1990 Unspecified osteoarthritis, unspecified site: Secondary | ICD-10-CM | POA: Diagnosis not present

## 2020-03-19 DIAGNOSIS — D631 Anemia in chronic kidney disease: Secondary | ICD-10-CM | POA: Diagnosis not present

## 2020-03-19 DIAGNOSIS — M109 Gout, unspecified: Secondary | ICD-10-CM | POA: Diagnosis not present

## 2020-03-28 ENCOUNTER — Other Ambulatory Visit: Payer: Self-pay

## 2020-03-28 ENCOUNTER — Inpatient Hospital Stay: Payer: Medicare HMO

## 2020-03-28 ENCOUNTER — Inpatient Hospital Stay: Payer: Medicare HMO | Attending: Family

## 2020-03-28 VITALS — BP 131/57 | HR 80 | Temp 98.1°F | Resp 17

## 2020-03-28 DIAGNOSIS — I130 Hypertensive heart and chronic kidney disease with heart failure and stage 1 through stage 4 chronic kidney disease, or unspecified chronic kidney disease: Secondary | ICD-10-CM | POA: Diagnosis not present

## 2020-03-28 DIAGNOSIS — N183 Chronic kidney disease, stage 3 unspecified: Secondary | ICD-10-CM | POA: Diagnosis not present

## 2020-03-28 DIAGNOSIS — I5042 Chronic combined systolic (congestive) and diastolic (congestive) heart failure: Secondary | ICD-10-CM | POA: Diagnosis not present

## 2020-03-28 DIAGNOSIS — D631 Anemia in chronic kidney disease: Secondary | ICD-10-CM | POA: Diagnosis not present

## 2020-03-28 DIAGNOSIS — I739 Peripheral vascular disease, unspecified: Secondary | ICD-10-CM | POA: Diagnosis not present

## 2020-03-28 DIAGNOSIS — J4540 Moderate persistent asthma, uncomplicated: Secondary | ICD-10-CM | POA: Diagnosis not present

## 2020-03-28 DIAGNOSIS — N184 Chronic kidney disease, stage 4 (severe): Secondary | ICD-10-CM | POA: Diagnosis not present

## 2020-03-28 DIAGNOSIS — M1990 Unspecified osteoarthritis, unspecified site: Secondary | ICD-10-CM | POA: Diagnosis not present

## 2020-03-28 DIAGNOSIS — M109 Gout, unspecified: Secondary | ICD-10-CM | POA: Diagnosis not present

## 2020-03-28 DIAGNOSIS — J449 Chronic obstructive pulmonary disease, unspecified: Secondary | ICD-10-CM | POA: Diagnosis not present

## 2020-03-28 LAB — CBC WITH DIFFERENTIAL (CANCER CENTER ONLY)
Abs Immature Granulocytes: 0 10*3/uL (ref 0.00–0.07)
Band Neutrophils: 0 %
Basophils Absolute: 0 10*3/uL (ref 0.0–0.1)
Basophils Relative: 0 %
Blasts: 0 %
Eosinophils Absolute: 0.2 10*3/uL (ref 0.0–0.5)
Eosinophils Relative: 4 %
HCT: 34.4 % — ABNORMAL LOW (ref 36.0–46.0)
Hemoglobin: 10.7 g/dL — ABNORMAL LOW (ref 12.0–15.0)
Lymphocytes Relative: 22 %
Lymphs Abs: 1.2 10*3/uL (ref 0.7–4.0)
MCH: 30.8 pg (ref 26.0–34.0)
MCHC: 31.1 g/dL (ref 30.0–36.0)
MCV: 99.1 fL (ref 80.0–100.0)
Metamyelocytes Relative: 0 %
Monocytes Absolute: 0.6 10*3/uL (ref 0.1–1.0)
Monocytes Relative: 12 %
Myelocytes: 0 %
Neutro Abs: 3.4 10*3/uL (ref 1.7–7.7)
Neutrophils Relative %: 62 %
Other: 0 %
Platelet Count: 96 10*3/uL — ABNORMAL LOW (ref 150–400)
Promyelocytes Relative: 0 %
RBC: 3.47 MIL/uL — ABNORMAL LOW (ref 3.87–5.11)
RDW: 14.4 % (ref 11.5–15.5)
WBC Count: 5.4 10*3/uL (ref 4.0–10.5)
nRBC: 0 % (ref 0.0–0.2)
nRBC: 0 /100 WBC

## 2020-03-28 LAB — CMP (CANCER CENTER ONLY)
ALT: 9 U/L (ref 0–44)
AST: 12 U/L — ABNORMAL LOW (ref 15–41)
Albumin: 4.2 g/dL (ref 3.5–5.0)
Alkaline Phosphatase: 43 U/L (ref 38–126)
Anion gap: 8 (ref 5–15)
BUN: 48 mg/dL — ABNORMAL HIGH (ref 8–23)
CO2: 34 mmol/L — ABNORMAL HIGH (ref 22–32)
Calcium: 10 mg/dL (ref 8.9–10.3)
Chloride: 100 mmol/L (ref 98–111)
Creatinine: 1.72 mg/dL — ABNORMAL HIGH (ref 0.44–1.00)
GFR, Estimated: 30 mL/min — ABNORMAL LOW (ref >60)
Glucose, Bld: 155 mg/dL — ABNORMAL HIGH (ref 70–99)
Potassium: 4 mmol/L (ref 3.5–5.1)
Sodium: 142 mmol/L (ref 135–145)
Total Bilirubin: 0.5 mg/dL (ref 0.3–1.2)
Total Protein: 6.5 g/dL (ref 6.5–8.1)

## 2020-03-28 MED ORDER — EPOETIN ALFA-EPBX 40000 UNIT/ML IJ SOLN
40000.0000 [IU] | Freq: Once | INTRAMUSCULAR | Status: AC
  Administered 2020-03-28: 40000 [IU] via SUBCUTANEOUS

## 2020-03-28 NOTE — Patient Instructions (Signed)

## 2020-04-02 ENCOUNTER — Ambulatory Visit
Admission: EM | Admit: 2020-04-02 | Discharge: 2020-04-02 | Disposition: A | Discharge status: Home / Self Care | Payer: Medicare HMO | Attending: Emergency Medicine | Admitting: Emergency Medicine

## 2020-04-02 ENCOUNTER — Other Ambulatory Visit: Payer: Self-pay

## 2020-04-02 ENCOUNTER — Encounter: Payer: Self-pay | Admitting: Emergency Medicine

## 2020-04-02 DIAGNOSIS — J069 Acute upper respiratory infection, unspecified: Secondary | ICD-10-CM | POA: Insufficient documentation

## 2020-04-02 DIAGNOSIS — Z20822 Contact with and (suspected) exposure to covid-19: Secondary | ICD-10-CM | POA: Diagnosis not present

## 2020-04-02 LAB — POCT RAPID STREP A (OFFICE): Rapid Strep A Screen: NEGATIVE

## 2020-04-02 MED ORDER — DM-GUAIFENESIN ER 30-600 MG PO TB12: 1.0000 | ORAL_TABLET | Freq: Two times a day (BID) | ORAL | 0 refills | Status: DC

## 2020-04-02 MED ORDER — BENZONATATE 100 MG PO CAPS: 200.0000 mg | ORAL_CAPSULE | Freq: Three times a day (TID) | ORAL | 0 refills | Status: DC

## 2020-04-02 MED ORDER — MUPIROCIN 2 % EX OINT: 1.0000 "application " | TOPICAL_OINTMENT | Freq: Two times a day (BID) | CUTANEOUS | 0 refills | Status: DC

## 2020-04-02 NOTE — ED Triage Notes (Signed)
Pt sts sore throat and cough with body aches and increased SOB x 3 days; pt on 2L home O2 all the time per pt

## 2020-04-02 NOTE — Discharge Instructions (Signed)
Please continue Claritin and Flonase Tessalon every 8 hours for cough May add in Mucinex DM for cough and congestion Continue albuterol inhalers, nebulizers as needed for shortness of breath, wheezing May use Bactroban ointment and nurse as needed Follow-up if not improving or worsening

## 2020-04-02 NOTE — ED Provider Notes (Signed)
[facility]    CSN: [id number] Arrival date & time: 04/02/20  1414      History   Chief Complaint Chief Complaint  Patient presents with  . Sore Throat  . Cough    HPI [name] is a 80 y.o. female history of CKD stage III, COPD, diastolic heart failure presenting today for evaluation of URI symptoms.  Patient reports over the past 1 to 2 days she has developed sore throat cough, body aches and slight shortness of breath.  She denies any significant wheezing at this time.  Is on 2 L chronically at home.  Denies any fevers, denies close sick contacts.  HPI  Past Medical History:  Diagnosis Date  . Abnormal glucose tolerance test   . Abnormal TSH 09/22/2016  . ACE-inhibitor cough   . Anemia 03/12/2014  . Arthritis   . Asthma    PFT 02/06/09 FEV1 1.41 (65%), FVC 1.92 (64), FEV1% 74, TLC 3.47 (71%), DLCO 48%, +BD  . Atypical chest pain    s/p cath, Normal coronaries, Non ST elevation myocardial infarction, Rt groin pseudoaneurysm  . Bacterial vaginosis 03/12/2014  . Cellulitis 06/22/2016  . Chronic kidney disease (CKD), stage III (moderate) ([place]) 08/04/2016  . COPD (chronic obstructive pulmonary disease) ([place])   . Depression   . Diastolic heart failure ([place]) 04/07/2016  . DVT (deep venous thrombosis) ([place]) 1987  . GERD (gastroesophageal reflux disease)   . Gout   . Hypercalcemia 10/15/2014  . Hyperlipidemia   . Hypertension   . Hypoxia 10/15/2014  . Macular degeneration 04/10/2015  . Osteopenia 12/29/2006   Qualifier: Diagnosis of  By: Wynona Luna   . Pneumonia   . 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 Renal insufficiency 09/22/2016  . Unspecified constipation 06/05/2013  . Vitamin D deficiency 01/01/2015    Patient Active Problem List   Diagnosis Date Noted  . Physical deconditioning 02/26/2020  . Rib fracture 04/07/2019  . Chronic asthma, mild persistent, uncomplicated 96/29/5284  . High serum vitamin B12 11/20/2017  . Subarachnoid hemorrhage  ([place]) 11/05/2017  . Intracranial hemorrhage ([place]) 11/04/2017  . Syncope and collapse 11/04/2017  . Thrombocytopenia ([place]) 10/16/2017  . 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 Vitamin D deficiency 06/16/2017  . Hypernatremia 12/25/2016  . Nonintractable headache 10/26/2016  . Leukocytes in urine 10/26/2016  . Peripheral arterial disease ([place]) 10/01/2016  . Abnormal TSH 09/22/2016  . 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[place]) 08/04/2016  . Heart murmur 04/25/2016  . Chronic combined systolic and diastolic CHF (congestive heart failure) ([place]) 04/07/2016  . Bronchitis 03/06/2016  . 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 Pedal edema 08/28/2015  . Chronic respiratory failure with hypoxia and hypercapnia ([place]) 06/28/2015  . Macular degeneration 04/10/2015  . Fall at home, initial encounter 12/04/2014  . Hypercalcemia 10/15/2014  . Hypoxia 10/15/2014  . 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 Cervical cancer screening 02/28/2014  . Eustachian tube dysfunction 01/29/2014  . Medicare annual wellness visit, subsequent 12/04/2013  . Constipation 06/05/2013  . Shortness of breath 03/07/2013  . Tachycardia 02/23/2013  . Sun-damaged skin 10/24/2012  . Lower urinary tract infectious disease 10/24/2012  . Back pain 04/07/2011  . Allergic rhinitis 05/23/2009  . Gout 01/11/2008  . VARICOSE VEINS, LOWER EXTREMITIES 06/01/2007  . ADJ DISORDER WITH MIXED ANXIETY & DEPRESSED MOOD 03/25/2007  . Hyperlipidemia, mixed 12/29/2006  . Essential hypertension 12/29/2006  . Osteopenia 12/29/2006  . Abdominal pain 12/29/2006  . Diabetes type  2, controlled ([place]) 12/29/2006  . Asthma, moderate persistent 08/21/2006  . GERD 08/21/2006  . Osteoarthritis 08/21/2006  . Phlebitis and thrombophlebitis 08/21/2006    Past Surgical History:  Procedure Laterality Date  . APPENDECTOMY  1951  . TONSILLECTOMY  1950  . TUBAL LIGATION  1968    OB History   No obstetric  history on file.      Home Medications    Prior to Admission medications   Medication Sig Start Date End Date Taking? Authorizing Provider  benzonatate (TESSALON) 100 MG capsule Take 2 capsules (200 mg total) by mouth every 8 (eight) hours. 04/02/20  Yes Deannie Resetar C, PA-C  dextromethorphan-guaiFENesin (MUCINEX DM) 30-600 MG 12hr tablet Take 1 tablet by mouth 2 (two) times daily. 04/02/20  Yes Wallice Granville C, PA-C  mupirocin ointment (BACTROBAN) 2 % Apply 1 application topically 2 (two) times daily. 04/02/20  Yes Nan Maya C, PA-C  Accu-Chek Softclix Lancets lancets CHECK BLOOD SUGAR ONE TIME WEEKLY 08/24/19   Mosie Lukes, MD  acetaminophen (TYLENOL) 500 MG tablet Take 500 mg by mouth 2 (two) times daily.     [provider]  albuterol (ACCUNEB) 0.63 MG/3ML nebulizer solution Take 3 mLs (0.63 mg total) by nebulization every 6 (six) hours as needed for wheezing or shortness of breath. 09/12/19   Mosie Lukes, MD  albuterol (PROVENTIL) (2.5 MG/3ML) 0.083% nebulizer solution  09/13/19   [provider]  albuterol (VENTOLIN HFA) 108 (90 Base) MCG/ACT inhaler Inhale 2 puffs into the lungs every 4 (four) hours as needed for wheezing or shortness of breath. 08/24/19   Mosie Lukes, MD  Alcohol Swabs (ALCOHOL WIPES) 70 % PADS To use before checking blood sugars 06/30/18   Mosie Lukes, MD  allopurinol (ZYLOPRIM) 100 MG tablet TAKE 2 TABLETS ONE TIME DAILY 09/13/19   Mosie Lukes, MD  b complex vitamins tablet Take 1 tablet by mouth daily.    [provider]  Blood Glucose Calibration (ACCU-CHEK AVIVA) SOLN USE AS DIRECTED 03/14/20   Mosie Lukes, MD  blood glucose meter kit and supplies Check blood sugars once weekly. 06/30/18   Mosie Lukes, MD  budesonide-formoterol [facility]) 160-4.5 MCG/ACT inhaler Inhale 2 puffs into the lungs in the morning and at bedtime. 08/24/19   Mosie Lukes, MD  docusate sodium (COLACE) 100 MG capsule Take 1 capsule (100  mg total) by mouth 2 (two) times daily. 04/14/19   Donne Hazel, MD  Ferrous Fumarate-Folic Acid (HEMOCYTE-F) 324-1 MG TABS Take 1 tablet by mouth daily. 07/24/16   Mosie Lukes, MD  fluticasone (FLONASE) 50 MCG/ACT nasal spray Place 2 sprays into both nostrils daily. 08/25/19   Mosie Lukes, MD  furosemide (LASIX) 40 MG tablet TAKE 1 TABLET DAILY AND A SECOND TABLET DAILY AS NEEDED FOR INCREASED EDEMA OR WEIGHT GAIN GREATER THAN 3 POUNDS IN 24 HOURS 12/19/19   Mosie Lukes, MD  glucose blood (ACCU-CHEK AVIVA PLUS) test strip CHECK BLOOD SUGAR ONE TIME WEEKLY 08/24/19   Mosie Lukes, MD  loratadine (CLARITIN) 10 MG tablet Take 1 tablet (10 mg total) by mouth at bedtime. Patient taking differently: Take 10 mg by mouth at bedtime as needed for allergies. 03/06/16   Magdalen Spatz, NP  Magnesium Oxide 250 MG TABS Take 1 tablet (250 mg total) by mouth daily. 02/02/20   Mosie Lukes, MD  metoprolol succinate (TOPROL-XL) 50 MG 24 hr tablet Take 1.5 tablets (75 mg total)  by mouth daily. Take with or immediately following a meal. 07/06/19   Mosie Lukes, MD  montelukast (SINGULAIR) 10 MG tablet Take 1 tablet (10 mg total) by mouth at bedtime. 07/01/19   Tanda Rockers, MD  OXYGEN Inhale 2 L into the lungs continuous.     [provider]  pantoprazole (PROTONIX) 40 MG tablet Take 40 mg by mouth 2 (two) times daily. 05/18/18   [provider]  polyethylene glycol (MIRALAX / GLYCOLAX) 17 g packet Take 17 g by mouth daily. 04/15/19   Donne Hazel, MD  pravastatin (PRAVACHOL) 40 MG tablet Take 1 tablet (40 mg total) by mouth daily. 03/13/20   Mosie Lukes, MD  Probiotic Product (PROBIOTIC PO) Take 1 tablet by mouth daily.     [provider]  sertraline (ZOLOFT) 100 MG tablet Take 1 tablet (100 mg total) by mouth daily. 08/24/19   Mosie Lukes, MD  traMADol (ULTRAM) 50 MG tablet TAKE 1 TABLET BY MOUTH THREE TIMES DAILY AS NEEDED FOR MODERATE PAIN 02/27/20   Mosie Lukes,  MD  trolamine salicylate (ASPERCREME) 10 % cream Apply 1 application topically as needed for muscle pain.    [provider]  vitamin B-12 (CYANOCOBALAMIN) 1000 MCG tablet Take 1,000 mcg by mouth daily.     [provider]    Family History Family History  Problem Relation Age of Onset  . Asthma Sister   . Hypertension Sister   . Hyperlipidemia Sister   . Uterine cancer Sister   . Coronary artery disease Brother        x2  . Arthritis Brother   . Lung cancer Brother        smoker  . Hypertension Sister   . Arthritis Sister   . Hyperlipidemia Sister   . Emphysema Sister   . COPD Sister        smoker  . Heart disease Sister   . Hyperlipidemia Sister   . Hypertension Sister   . Arthritis Sister   . Emphysema Brother   . Heart disease Brother         smoker  . Heart attack Brother   . Mental illness Father   . Suicidality Father   . Heart disease Mother   . Hyperlipidemia Mother   . Heart attack Mother   . Epilepsy Daughter   . Hypertension Daughter   . Obesity Daughter   . COPD Brother        smoker  . Lung cancer Brother   . Coronary artery disease Other   . Colon polyps Sister     Social History Social History   Tobacco Use  . Smoking status: Never Smoker  . Smokeless tobacco: Never Used  Vaping Use  . Vaping Use: Never used  Substance Use Topics  . Alcohol use: No  . Drug use: No     Allergies   Montelukast sodium, Sulfa antibiotics, Sulfonamide derivatives, Oseltamivir phosphate, Ace inhibitors, and Indomethacin   Review of Systems Review of Systems  Constitutional: Negative for activity change, appetite change, chills, fatigue and fever.  HENT: Positive for congestion, rhinorrhea and sore throat. Negative for ear pain, sinus pressure and trouble swallowing.   Eyes: Negative for discharge and redness.  Respiratory: Positive for cough and shortness of breath. Negative for chest tightness.   Cardiovascular: Negative for chest  pain.  Gastrointestinal: Negative for abdominal pain, diarrhea, nausea and vomiting.  Musculoskeletal: Negative for myalgias.  Skin: Negative for rash.  Neurological: Negative for dizziness, light-headedness and headaches.     Physical Exam Triage Vital Signs ED Triage Vitals  Enc Vitals Group     BP 04/02/20 1626 (!) 158/77     Pulse Rate 04/02/20 1626 93     Resp 04/02/20 1626 20     Temp 04/02/20 1626 98.6 F (37 C)     Temp Source 04/02/20 1626 Oral     SpO2 04/02/20 1626 97 %     Weight --      Height --      Head Circumference --      Peak Flow --      Pain Score 04/02/20 1627 6     Pain Loc --      Pain Edu? --      Excl. in [place]? --    No data found.  Updated Vital Signs BP (!) 158/77 (BP Location: Left Arm)   Pulse 93   Temp 98.6 F (37 C) (Oral)   Resp 20   SpO2 97%   Visual Acuity Right Eye Distance:   Left Eye Distance:   Bilateral Distance:    Right Eye Near:   Left Eye Near:    Bilateral Near:     Physical Exam Vitals and nursing note reviewed.  Constitutional:      Appearance: She is well-developed.     Comments: No acute distress  HENT:     Head: Normocephalic and atraumatic.     Ears:     Comments: Bilateral ears without tenderness to palpation of external auricle, tragus and mastoid, EAC's without erythema or swelling, TM's with good bony landmarks and cone of light. Non erythematous.     Nose: Nose normal.     Mouth/Throat:     Comments: Oral mucosa pink and moist, no tonsillar enlargement or exudate. Posterior pharynx patent and nonerythematous, no uvula deviation or swelling. Normal phonation. Eyes:     Conjunctiva/sclera: Conjunctivae normal.  Cardiovascular:     Rate and Rhythm: Normal rate.  Pulmonary:     Effort: Pulmonary effort is normal. No respiratory distress.     Comments: Breathing comfortably at rest, CTABL, no wheezing, rales or other adventitious sounds auscultated Abdominal:     General: There is no distension.   Musculoskeletal:        General: Normal range of motion.     Cervical back: Neck supple.  Skin:    General: Skin is warm and dry.  Neurological:     Mental Status: She is alert and oriented to person, place, and time.      UC Treatments / Results  Labs (all labs ordered are listed, but only abnormal results are displayed) Labs Reviewed  NOVEL CORONAVIRUS, NAA  CULTURE, GROUP A STREP [facility])  POCT RAPID STREP A (OFFICE)    EKG   Radiology No results found.  Procedures Procedures (including critical care time)  Medications Ordered in UC Medications - No data to display  Initial Impression / Assessment and Plan / UC Course  I have reviewed the triage vital signs and the nursing notes.  Pertinent labs & imaging results that were available during my care of the patient were reviewed by me and considered in my medical decision making (see chart for details).     URI symptoms x2 days, vital signs stable, lungs clear to auscultation today, patient declines feeling exacerbation of symptoms needing steroids at this time, will defer to avoid exacerbation of underlying CHF.  Tessalon for cough, Bactroban  to help with nasal discomfort.  Mucinex and patient may continue her Claritin and Flonase.  Advised patient to closely monitor breathing and symptoms over the next 24 to 48 hours, follow-up if symptoms not improving or worsening.  Discussed strict return precautions. Patient verbalized understanding and is agreeable with plan.  Final Clinical Impressions(s) / UC Diagnoses   Final diagnoses:  Encounter for screening laboratory testing for COVID-19 virus  Viral URI with cough     Discharge Instructions     Please continue Claritin and Flonase Tessalon every 8 hours for cough May add in Mucinex DM for cough and congestion Continue albuterol inhalers, nebulizers as needed for shortness of breath, wheezing May use Bactroban ointment and nurse as needed Follow-up if not  improving or worsening    ED Prescriptions    Medication Sig Dispense Auth. Provider   dextromethorphan-guaiFENesin (MUCINEX DM) 30-600 MG 12hr tablet Take 1 tablet by mouth 2 (two) times daily. 20 tablet Iley Deignan C, PA-C   benzonatate (TESSALON) 100 MG capsule Take 2 capsules (200 mg total) by mouth every 8 (eight) hours. 21 capsule Adelina Collard C, PA-C   mupirocin ointment (BACTROBAN) 2 % Apply 1 application topically 2 (two) times daily. 30 g [employer], [place] C, PA-C     PDMP not reviewed this encounter.   Janith Lima, [place] 04/02/20 2044

## 2020-04-03 LAB — NOVEL CORONAVIRUS, NAA: SARS-CoV-2, NAA: NOT DETECTED

## 2020-04-03 LAB — SARS-COV-2, NAA 2 DAY TAT

## 2020-04-04 ENCOUNTER — Telehealth (HOSPITAL_COMMUNITY): Payer: Self-pay | Admitting: Emergency Medicine

## 2020-04-04 MED ORDER — PREDNISONE 10 MG PO TABS: 40.0000 mg | ORAL_TABLET | Freq: Every day | ORAL | 0 refills | Status: AC

## 2020-04-04 NOTE — Telephone Encounter (Signed)
Patient called for COVID results.  Verified identity using two identifiers and provided negative results.  Patient states she is not improving and would like a prescription for steroids.  Reached out to Dr. Mannie Stabile, who okay'd "prednisone 20mg  TWO tablets by mouth QD #20. Ensure she calls her PCP for f/u".  Patient updated, prescription sent

## 2020-04-06 LAB — CULTURE, GROUP A STREP (THRC)

## 2020-04-07 DIAGNOSIS — N184 Chronic kidney disease, stage 4 (severe): Secondary | ICD-10-CM | POA: Diagnosis not present

## 2020-04-07 DIAGNOSIS — J4540 Moderate persistent asthma, uncomplicated: Secondary | ICD-10-CM | POA: Diagnosis not present

## 2020-04-07 DIAGNOSIS — I5042 Chronic combined systolic (congestive) and diastolic (congestive) heart failure: Secondary | ICD-10-CM | POA: Diagnosis not present

## 2020-04-07 DIAGNOSIS — I130 Hypertensive heart and chronic kidney disease with heart failure and stage 1 through stage 4 chronic kidney disease, or unspecified chronic kidney disease: Secondary | ICD-10-CM | POA: Diagnosis not present

## 2020-04-07 DIAGNOSIS — I739 Peripheral vascular disease, unspecified: Secondary | ICD-10-CM | POA: Diagnosis not present

## 2020-04-07 DIAGNOSIS — M109 Gout, unspecified: Secondary | ICD-10-CM | POA: Diagnosis not present

## 2020-04-07 DIAGNOSIS — J449 Chronic obstructive pulmonary disease, unspecified: Secondary | ICD-10-CM | POA: Diagnosis not present

## 2020-04-07 DIAGNOSIS — M1990 Unspecified osteoarthritis, unspecified site: Secondary | ICD-10-CM | POA: Diagnosis not present

## 2020-04-07 DIAGNOSIS — D631 Anemia in chronic kidney disease: Secondary | ICD-10-CM | POA: Diagnosis not present

## 2020-04-08 ENCOUNTER — Encounter (HOSPITAL_BASED_OUTPATIENT_CLINIC_OR_DEPARTMENT_OTHER): Payer: Self-pay | Admitting: Emergency Medicine

## 2020-04-08 ENCOUNTER — Other Ambulatory Visit: Payer: Self-pay

## 2020-04-08 ENCOUNTER — Emergency Department (HOSPITAL_BASED_OUTPATIENT_CLINIC_OR_DEPARTMENT_OTHER)
Admission: EM | Admit: 2020-04-08 | Discharge: 2020-04-08 | Disposition: A | Discharge status: Home / Self Care | Payer: Medicare HMO | Attending: Emergency Medicine | Admitting: Emergency Medicine

## 2020-04-08 ENCOUNTER — Emergency Department (HOSPITAL_BASED_OUTPATIENT_CLINIC_OR_DEPARTMENT_OTHER): Payer: Medicare HMO

## 2020-04-08 DIAGNOSIS — I130 Hypertensive heart and chronic kidney disease with heart failure and stage 1 through stage 4 chronic kidney disease, or unspecified chronic kidney disease: Secondary | ICD-10-CM | POA: Insufficient documentation

## 2020-04-08 DIAGNOSIS — J4530 Mild persistent asthma, uncomplicated: Secondary | ICD-10-CM | POA: Insufficient documentation

## 2020-04-08 DIAGNOSIS — Z7951 Long term (current) use of inhaled steroids: Secondary | ICD-10-CM | POA: Insufficient documentation

## 2020-04-08 DIAGNOSIS — J441 Chronic obstructive pulmonary disease with (acute) exacerbation: Secondary | ICD-10-CM | POA: Diagnosis not present

## 2020-04-08 DIAGNOSIS — Z79899 Other long term (current) drug therapy: Secondary | ICD-10-CM | POA: Diagnosis not present

## 2020-04-08 DIAGNOSIS — I5042 Chronic combined systolic (congestive) and diastolic (congestive) heart failure: Secondary | ICD-10-CM | POA: Diagnosis not present

## 2020-04-08 DIAGNOSIS — J449 Chronic obstructive pulmonary disease, unspecified: Secondary | ICD-10-CM | POA: Diagnosis not present

## 2020-04-08 DIAGNOSIS — J209 Acute bronchitis, unspecified: Secondary | ICD-10-CM | POA: Diagnosis not present

## 2020-04-08 DIAGNOSIS — N184 Chronic kidney disease, stage 4 (severe): Secondary | ICD-10-CM | POA: Diagnosis not present

## 2020-04-08 DIAGNOSIS — E1122 Type 2 diabetes mellitus with diabetic chronic kidney disease: Secondary | ICD-10-CM | POA: Insufficient documentation

## 2020-04-08 DIAGNOSIS — I517 Cardiomegaly: Secondary | ICD-10-CM | POA: Diagnosis not present

## 2020-04-08 DIAGNOSIS — R059 Cough, unspecified: Secondary | ICD-10-CM | POA: Diagnosis not present

## 2020-04-08 MED ORDER — PREDNISONE 10 MG PO TABS: ORAL_TABLET | ORAL | 0 refills | Status: AC

## 2020-04-08 MED ORDER — DOXYCYCLINE HYCLATE 100 MG PO CAPS: 100.0000 mg | ORAL_CAPSULE | Freq: Two times a day (BID) | ORAL | 0 refills | Status: DC

## 2020-04-08 MED ORDER — DOXYCYCLINE HYCLATE 100 MG PO TABS
100.0000 mg | ORAL_TABLET | Freq: Once | ORAL | Status: AC
  Administered 2020-04-08: 100 mg via ORAL
  Filled 2020-04-08: qty 1

## 2020-04-08 NOTE — ED Provider Notes (Signed)
[facility] EMERGENCY DEPARTMENT Provider Note   CSN: [id number] Arrival date & time: 04/08/20  1441     History Chief Complaint  Patient presents with  . Cough    [name] is a 80 y.o. female.  The history is provided by the patient and medical records.  Cough  [name] is a 80 y.o. female who presents to the Emergency Department complaining of cough. She presents the emergency department complaining of upper respiratory infection for the last week. She reports a cough productive of yellow sputum. She was seen in urgent care about one week ago and treated with a prednisone burst, last dose is tomorrow. She reports that she has worsening cough, shortness of breath with dyspnea on exertion and she also has right-sided back pain when she coughs. No fevers, chest pain, abdominal pain. She has chronic lower extremity edema and states that this is at her baseline. No history of DVT/PE. She does have a history of CKD, recurrent anemia. She also has a history of COPD and is on 2 L of oxygen at baseline. Symptoms are moderate, constant, worsening.    Past Medical History:  Diagnosis Date  . Abnormal glucose tolerance test   . Abnormal TSH 09/22/2016  . ACE-inhibitor cough   . Anemia 03/12/2014  . Arthritis   . Asthma    PFT 02/06/09 FEV1 1.41 (65%), FVC 1.92 (64), FEV1% 74, TLC 3.47 (71%), DLCO 48%, +BD  . Atypical chest pain    s/p cath, Normal coronaries, Non ST elevation myocardial infarction, Rt groin pseudoaneurysm  . Bacterial vaginosis 03/12/2014  . Cellulitis 06/22/2016  . Chronic kidney disease (CKD), stage III (moderate) (HCC) 08/04/2016  . COPD (chronic obstructive pulmonary disease) (HCC)   . Depression   . Diastolic heart failure (HCC) 04/07/2016  . DVT (deep venous thrombosis) (HCC) 1987  . GERD (gastroesophageal reflux disease)   . Gout   . Hypercalcemia 10/15/2014  . Hyperlipidemia   . Hypertension   . Hypoxia 10/15/2014  . Macular degeneration  04/10/2015  . Osteopenia 12/29/2006   Qualifier: Diagnosis of  By: Nena Jordan   . Pneumonia   . 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 Renal insufficiency 09/22/2016  . Unspecified constipation 06/05/2013  . Vitamin D deficiency 01/01/2015    Patient Active Problem List   Diagnosis Date Noted  . Physical deconditioning 02/26/2020  . Rib fracture 04/07/2019  . Chronic asthma, mild persistent, uncomplicated 02/23/2018  . High serum vitamin B12 11/20/2017  . Subarachnoid hemorrhage (HCC) 11/05/2017  . Intracranial hemorrhage (HCC) 11/04/2017  . Syncope and collapse 11/04/2017  . Thrombocytopenia (HCC) 10/16/2017  . 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 Vitamin D deficiency 06/16/2017  . Hypernatremia 12/25/2016  . Nonintractable headache 10/26/2016  . Leukocytes in urine 10/26/2016  . Peripheral arterial disease (HCC) 10/01/2016  . Abnormal TSH 09/22/2016  . 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/2016  . Heart murmur 04/25/2016  . Chronic combined systolic and diastolic CHF (congestive heart failure) (HCC) 04/07/2016  . Bronchitis 03/06/2016  . 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 Pedal edema 08/28/2015  . Chronic respiratory failure with hypoxia and hypercapnia (HCC) 06/28/2015  . Macular degeneration 04/10/2015  . Fall at home, initial encounter 12/04/2014  . Hypercalcemia 10/15/2014  . Hypoxia 10/15/2014  . 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HCC) 03/12/2014  . Cervical cancer screening 02/28/2014  . Eustachian tube dysfunction 01/29/2014  . Medicare annual wellness visit, subsequent 12/04/2013  .  Constipation 06/05/2013  . Shortness of breath 03/07/2013  . Tachycardia 02/23/2013  . Sun-damaged skin 10/24/2012  . Lower urinary tract infectious disease 10/24/2012  . Back pain 04/07/2011  . Allergic rhinitis 05/23/2009  . Gout 01/11/2008  . VARICOSE VEINS, LOWER EXTREMITIES 06/01/2007  . ADJ DISORDER WITH MIXED ANXIETY &  DEPRESSED MOOD 03/25/2007  . Hyperlipidemia, mixed 12/29/2006  . Essential hypertension 12/29/2006  . Osteopenia 12/29/2006  . Abdominal pain 12/29/2006  . Diabetes type 2, controlled ([place]) 12/29/2006  . Asthma, moderate persistent 08/21/2006  . GERD 08/21/2006  . Osteoarthritis 08/21/2006  . Phlebitis and thrombophlebitis 08/21/2006    Past Surgical History:  Procedure Laterality Date  . APPENDECTOMY  1951  . TONSILLECTOMY  1950  . TUBAL LIGATION  1968     OB History   No obstetric history on file.     Family History  Problem Relation Age of Onset  . Asthma Sister   . Hypertension Sister   . Hyperlipidemia Sister   . Uterine cancer Sister   . Coronary artery disease Brother        x2  . Arthritis Brother   . Lung cancer Brother        smoker  . Hypertension Sister   . Arthritis Sister   . Hyperlipidemia Sister   . Emphysema Sister   . COPD Sister        smoker  . Heart disease Sister   . Hyperlipidemia Sister   . Hypertension Sister   . Arthritis Sister   . Emphysema Brother   . Heart disease Brother         smoker  . Heart attack Brother   . Mental illness Father   . Suicidality Father   . Heart disease Mother   . Hyperlipidemia Mother   . Heart attack Mother   . Epilepsy Daughter   . Hypertension Daughter   . Obesity Daughter   . COPD Brother        smoker  . Lung cancer Brother   . Coronary artery disease Other   . Colon polyps Sister     Social History   Tobacco Use  . Smoking status: Never Smoker  . Smokeless tobacco: Never Used  Vaping Use  . Vaping Use: Never used  Substance Use Topics  . Alcohol use: No  . Drug use: No    Home Medications Prior to Admission medications   Medication Sig Start Date End Date Taking? Authorizing Provider  doxycycline (VIBRAMYCIN) 100 MG capsule Take 1 capsule (100 mg total) by mouth 2 (two) times daily. 04/08/20  Yes Quintella Reichert, MD  predniSONE (DELTASONE) 10 MG tablet Take 3 tablets (30 mg  total) by mouth daily for 3 days, THEN 2 tablets (20 mg total) daily for 3 days, THEN 1 tablet (10 mg total) daily for 3 days. 04/08/20 04/17/20 Yes Quintella Reichert, MD  Accu-Chek Softclix Lancets lancets CHECK BLOOD SUGAR ONE TIME WEEKLY 08/24/19   Mosie Lukes, MD  acetaminophen (TYLENOL) 500 MG tablet Take 500 mg by mouth 2 (two) times daily.     [provider]  albuterol (ACCUNEB) 0.63 MG/3ML nebulizer solution Take 3 mLs (0.63 mg total) by nebulization every 6 (six) hours as needed for wheezing or shortness of breath. 09/12/19   Mosie Lukes, MD  albuterol (PROVENTIL) (2.5 MG/3ML) 0.083% nebulizer solution  09/13/19   [provider]  albuterol (VENTOLIN HFA) 108 (90 Base) MCG/ACT inhaler Inhale 2 puffs into the lungs every 4 (  four) hours as needed for wheezing or shortness of breath. 08/24/19   Mosie Lukes, MD  Alcohol Swabs (ALCOHOL WIPES) 70 % PADS To use before checking blood sugars 06/30/18   Mosie Lukes, MD  allopurinol (ZYLOPRIM) 100 MG tablet TAKE 2 TABLETS ONE TIME DAILY 09/13/19   Mosie Lukes, MD  b complex vitamins tablet Take 1 tablet by mouth daily.    [provider]  benzonatate (TESSALON) 100 MG capsule Take 2 capsules (200 mg total) by mouth every 8 (eight) hours. 04/02/20   Wieters, Hallie C, PA-C  Blood Glucose Calibration (ACCU-CHEK AVIVA) SOLN USE AS DIRECTED 03/14/20   Mosie Lukes, MD  blood glucose meter kit and supplies Check blood sugars once weekly. 06/30/18   Mosie Lukes, MD  budesonide-formoterol [facility]) 160-4.5 MCG/ACT inhaler Inhale 2 puffs into the lungs in the morning and at bedtime. 08/24/19   Mosie Lukes, MD  dextromethorphan-guaiFENesin [facility] DM) 30-600 MG 12hr tablet Take 1 tablet by mouth 2 (two) times daily. 04/02/20   Wieters, Hallie C, PA-C  docusate sodium (COLACE) 100 MG capsule Take 1 capsule (100 mg total) by mouth 2 (two) times daily. 04/14/19   Donne Hazel, MD  Ferrous Fumarate-Folic Acid  (HEMOCYTE-F) 324-1 MG TABS Take 1 tablet by mouth daily. 07/24/16   Mosie Lukes, MD  fluticasone (FLONASE) 50 MCG/ACT nasal spray Place 2 sprays into both nostrils daily. 08/25/19   Mosie Lukes, MD  furosemide (LASIX) 40 MG tablet TAKE 1 TABLET DAILY AND A SECOND TABLET DAILY AS NEEDED FOR INCREASED EDEMA OR WEIGHT GAIN GREATER THAN 3 POUNDS IN 24 HOURS 12/19/19   Mosie Lukes, MD  glucose blood (ACCU-CHEK AVIVA PLUS) test strip CHECK BLOOD SUGAR ONE TIME WEEKLY 08/24/19   Mosie Lukes, MD  loratadine (CLARITIN) 10 MG tablet Take 1 tablet (10 mg total) by mouth at bedtime. Patient taking differently: Take 10 mg by mouth at bedtime as needed for allergies. 03/06/16   Magdalen Spatz, NP  Magnesium Oxide 250 MG TABS Take 1 tablet (250 mg total) by mouth daily. 02/02/20   Mosie Lukes, MD  metoprolol succinate (TOPROL-XL) 50 MG 24 hr tablet Take 1.5 tablets (75 mg total) by mouth daily. Take with or immediately following a meal. 07/06/19   Mosie Lukes, MD  montelukast (SINGULAIR) 10 MG tablet Take 1 tablet (10 mg total) by mouth at bedtime. 07/01/19   Tanda Rockers, MD  mupirocin ointment (BACTROBAN) 2 % Apply 1 application topically 2 (two) times daily. 04/02/20   Wieters, Hallie C, PA-C  OXYGEN Inhale 2 L into the lungs continuous.     [provider]  pantoprazole (PROTONIX) 40 MG tablet Take 40 mg by mouth 2 (two) times daily. 05/18/18   [provider]  polyethylene glycol (MIRALAX / GLYCOLAX) 17 g packet Take 17 g by mouth daily. 04/15/19   Donne Hazel, MD  pravastatin (PRAVACHOL) 40 MG tablet Take 1 tablet (40 mg total) by mouth daily. 03/13/20   Mosie Lukes, MD  predniSONE (DELTASONE) 10 MG tablet Take 4 tablets (40 mg total) by mouth daily with breakfast for 5 days. 04/04/20 04/09/20  Chase Picket, MD  Probiotic Product (PROBIOTIC PO) Take 1 tablet by mouth daily.     [provider]  sertraline (ZOLOFT) 100 MG tablet Take 1 tablet (100 mg total) by  mouth daily. 08/24/19   Mosie Lukes, MD  traMADol Veat[facility]bon) 50 MG  tablet TAKE 1 TABLET BY MOUTH THREE TIMES DAILY AS NEEDED FOR MODERATE PAIN 02/27/20   Mosie Lukes, MD  trolamine salicylate (ASPERCREME) 10 % cream Apply 1 application topically as needed for muscle pain.    [provider]  vitamin B-12 (CYANOCOBALAMIN) 1000 MCG tablet Take 1,000 mcg by mouth daily.     [provider]    Allergies    Montelukast sodium, Sulfa antibiotics, Sulfonamide derivatives, Oseltamivir phosphate, Ace inhibitors, and Indomethacin  Review of Systems   Review of Systems  Respiratory: Positive for cough.   All other systems reviewed and are negative.   Physical Exam Updated Vital Signs BP (!) 147/65 (BP Location: Left Arm)   Pulse 68   Temp 98.5 F (36.9 C) (Oral)   Resp 18   Ht $R'5\' 4"'no$  (1.626 m)   Wt 83 kg   SpO2 100%   BMI 31.41 kg/m   Physical Exam Vitals and nursing note reviewed.  Constitutional:      Appearance: She is well-developed.  HENT:     Head: Normocephalic and atraumatic.  Cardiovascular:     Rate and Rhythm: Normal rate and regular rhythm.     Heart sounds: No murmur heard.   Pulmonary:     Effort: Pulmonary effort is normal. No respiratory distress.     Comments: faint wheezes in the left lower lung fields. Rhonchi in the right lower lung fields. No chest wall tenderness to palpation or overlying rashes. Abdominal:     Palpations: Abdomen is soft.     Tenderness: There is no abdominal tenderness. There is no guarding or rebound.  Musculoskeletal:     Comments: There are chronic venous stasis changes to bilateral lower extremities with non pitting edema to bilateral lower extremities.  Skin:    General: Skin is warm and dry.  Neurological:     Mental Status: She is alert and oriented to person, place, and time.  Psychiatric:        Behavior: Behavior normal.     ED Results / Procedures / Treatments   Labs (all labs ordered are listed, but  only abnormal results are displayed) Labs Reviewed - No data to display  EKG None  Radiology DG Chest Portable 1 View  Result Date: 04/08/2020 CLINICAL DATA:  Cough, congestion EXAM: PORTABLE CHEST 1 VIEW COMPARISON:  05/30/2019 FINDINGS: There is hyperinflation of the lungs compatible with COPD. Cardiomegaly. No confluent airspace opacities or effusions. Stable mild chronic elevation of the left hemidiaphragm. No acute bony abnormality. IMPRESSION: Cardiomegaly, COPD.  Chronic changes.  No active disease. Electronically Signed   By: Rolm Baptise M.D.   On: 04/08/2020 15:39    Procedures Procedures   Medications Ordered in ED Medications  doxycycline (VIBRA-TABS) tablet 100 mg (has no administration in time range)    ED Course  I have reviewed the triage vital signs and the nursing notes.  Pertinent labs & imaging results that were available during my care of the patient were reviewed by me and considered in my medical decision making (see chart for details).    MDM Rules/Calculators/A&P                         patient with history of COPD on oxygen, CKD, anemia here for evaluation of persistent cough, shortness of breath and wheezing, currently on prednisone burst. She does have wheezing and rhonchi on examination with no respiratory distress. Plain film is negative for pneumonia, demonstrates chronic  changes. Given her medical history recommend labs to rule out progressive renal disease, progressive anemia and other serious medical conditions. Patient declines lab evaluation at this time. Will treat with antibiotics for possible developing pneumonia given asymmetric breath sounds. Will also continue her prednisone burst with a taper. Discussed importance of PCP follow-up as well as close return precautions for progressive symptoms.  Final Clinical Impression(s) / ED Diagnoses Final diagnoses:  Acute bronchitis, unspecified organism    Rx / DC Orders ED Discharge Orders          Ordered    predniSONE (DELTASONE) 10 MG tablet        04/08/20 1631    doxycycline (VIBRAMYCIN) 100 MG capsule  2 times daily        04/08/20 1631           Quintella Reichert, MD 04/08/20 1644

## 2020-04-08 NOTE — ED Triage Notes (Signed)
Pt arrives pov with c/o cough and R side back pain x 7 days. Recent treatment at ED on 3/14. Pt on L o2 at home.

## 2020-04-11 ENCOUNTER — Other Ambulatory Visit: Payer: Self-pay | Admitting: *Deleted

## 2020-04-11 MED ORDER — ACCU-CHEK SOFTCLIX LANCETS MISC: 1 refills | Status: DC

## 2020-04-11 MED ORDER — ACCU-CHEK AVIVA PLUS VI STRP: ORAL_STRIP | 1 refills | Status: DC

## 2020-04-11 MED ORDER — ACCU-CHEK AVIVA PLUS W/DEVICE KIT: PACK | 1 refills | Status: DC

## 2020-04-11 MED ORDER — ALCOHOL WIPES 70 % PADS: MEDICATED_PAD | 1 refills | Status: DC

## 2020-04-13 DIAGNOSIS — R0902 Hypoxemia: Secondary | ICD-10-CM | POA: Diagnosis not present

## 2020-04-13 DIAGNOSIS — J4520 Mild intermittent asthma, uncomplicated: Secondary | ICD-10-CM | POA: Diagnosis not present

## 2020-04-15 ENCOUNTER — Other Ambulatory Visit: Payer: Self-pay | Admitting: Family Medicine

## 2020-04-16 NOTE — Telephone Encounter (Signed)
Requesting: tramadol 50mg  Contract:  UDS: 12/23/16 Last Visit: 02/23/20 Next Visit: 06/26/20 Last Refill: 02/27/20  Please Advise

## 2020-04-18 ENCOUNTER — Inpatient Hospital Stay: Payer: Medicare HMO | Admitting: Family

## 2020-04-18 ENCOUNTER — Inpatient Hospital Stay: Payer: Medicare HMO

## 2020-04-18 ENCOUNTER — Other Ambulatory Visit: Payer: Self-pay

## 2020-04-18 ENCOUNTER — Other Ambulatory Visit: Payer: Self-pay | Admitting: Family Medicine

## 2020-04-18 ENCOUNTER — Telehealth: Payer: Self-pay

## 2020-04-18 DIAGNOSIS — N183 Chronic kidney disease, stage 3 unspecified: Secondary | ICD-10-CM | POA: Diagnosis not present

## 2020-04-18 DIAGNOSIS — D631 Anemia in chronic kidney disease: Secondary | ICD-10-CM | POA: Diagnosis not present

## 2020-04-18 DIAGNOSIS — D508 Other iron deficiency anemias: Secondary | ICD-10-CM

## 2020-04-18 LAB — CMP (CANCER CENTER ONLY)
ALT: 15 U/L (ref 0–44)
AST: 12 U/L — ABNORMAL LOW (ref 15–41)
Albumin: 4.1 g/dL (ref 3.5–5.0)
Alkaline Phosphatase: 38 U/L (ref 38–126)
Anion gap: 8 (ref 5–15)
BUN: 54 mg/dL — ABNORMAL HIGH (ref 8–23)
CO2: 37 mmol/L — ABNORMAL HIGH (ref 22–32)
Calcium: 9.9 mg/dL (ref 8.9–10.3)
Chloride: 98 mmol/L (ref 98–111)
Creatinine: 1.51 mg/dL — ABNORMAL HIGH (ref 0.44–1.00)
GFR, Estimated: 35 mL/min — ABNORMAL LOW (ref >60)
Glucose, Bld: 71 mg/dL (ref 70–99)
Potassium: 4.2 mmol/L (ref 3.5–5.1)
Sodium: 143 mmol/L (ref 135–145)
Total Bilirubin: 0.6 mg/dL (ref 0.3–1.2)
Total Protein: 6.7 g/dL (ref 6.5–8.1)

## 2020-04-18 LAB — CBC WITH DIFFERENTIAL (CANCER CENTER ONLY)
Abs Immature Granulocytes: 0.28 10*3/uL — ABNORMAL HIGH (ref 0.00–0.07)
Basophils Absolute: 0 10*3/uL (ref 0.0–0.1)
Basophils Relative: 0 %
Eosinophils Absolute: 0.2 10*3/uL (ref 0.0–0.5)
Eosinophils Relative: 1 %
HCT: 36 % (ref 36.0–46.0)
Hemoglobin: 11.1 g/dL — ABNORMAL LOW (ref 12.0–15.0)
Immature Granulocytes: 2 %
Lymphocytes Relative: 15 %
Lymphs Abs: 1.8 10*3/uL (ref 0.7–4.0)
MCH: 30.1 pg (ref 26.0–34.0)
MCHC: 30.8 g/dL (ref 30.0–36.0)
MCV: 97.6 fL (ref 80.0–100.0)
Monocytes Absolute: 1.4 10*3/uL — ABNORMAL HIGH (ref 0.1–1.0)
Monocytes Relative: 11 %
Neutro Abs: 8.5 10*3/uL — ABNORMAL HIGH (ref 1.7–7.7)
Neutrophils Relative %: 71 %
Platelet Count: 154 10*3/uL (ref 150–400)
RBC: 3.69 MIL/uL — ABNORMAL LOW (ref 3.87–5.11)
RDW: 16.2 % — ABNORMAL HIGH (ref 11.5–15.5)
WBC Count: 12.1 10*3/uL — ABNORMAL HIGH (ref 4.0–10.5)
nRBC: 0 % (ref 0.0–0.2)

## 2020-04-18 LAB — RETICULOCYTES
Immature Retic Fract: 10.5 % (ref 2.3–15.9)
RBC.: 3.67 MIL/uL — ABNORMAL LOW (ref 3.87–5.11)
Retic Count, Absolute: 62.4 10*3/uL (ref 19.0–186.0)
Retic Ct Pct: 1.7 % (ref 0.4–3.1)

## 2020-04-18 NOTE — Telephone Encounter (Signed)
S/w pt per staff message and she is aware of her appts   [name]

## 2020-04-18 NOTE — Telephone Encounter (Signed)
No injection needed today, patient aware and lab work given.

## 2020-04-19 LAB — IRON AND TIBC
Iron: 119 ug/dL (ref 41–142)
Saturation Ratios: 48 % (ref 21–57)
TIBC: 250 ug/dL (ref 236–444)
UIBC: 131 ug/dL (ref 120–384)

## 2020-04-19 LAB — FERRITIN: Ferritin: 504 ng/mL — ABNORMAL HIGH (ref 11–307)

## 2020-04-20 NOTE — Progress Notes (Signed)
Erroneous

## 2020-05-08 ENCOUNTER — Inpatient Hospital Stay (HOSPITAL_BASED_OUTPATIENT_CLINIC_OR_DEPARTMENT_OTHER): Payer: Medicare HMO | Admitting: Family

## 2020-05-08 ENCOUNTER — Other Ambulatory Visit: Payer: Self-pay

## 2020-05-08 ENCOUNTER — Inpatient Hospital Stay: Payer: Medicare HMO

## 2020-05-08 ENCOUNTER — Encounter: Payer: Self-pay | Admitting: Family

## 2020-05-08 ENCOUNTER — Inpatient Hospital Stay: Payer: Medicare HMO | Attending: Family

## 2020-05-08 ENCOUNTER — Other Ambulatory Visit: Payer: Self-pay | Admitting: Family

## 2020-05-08 VITALS — BP 109/64 | HR 91 | Temp 98.2°F | Resp 18 | Wt 185.0 lb

## 2020-05-08 DIAGNOSIS — Z79899 Other long term (current) drug therapy: Secondary | ICD-10-CM | POA: Insufficient documentation

## 2020-05-08 DIAGNOSIS — D508 Other iron deficiency anemias: Secondary | ICD-10-CM | POA: Diagnosis not present

## 2020-05-08 DIAGNOSIS — D631 Anemia in chronic kidney disease: Secondary | ICD-10-CM

## 2020-05-08 DIAGNOSIS — N189 Chronic kidney disease, unspecified: Secondary | ICD-10-CM | POA: Diagnosis not present

## 2020-05-08 DIAGNOSIS — N183 Chronic kidney disease, stage 3 unspecified: Secondary | ICD-10-CM | POA: Insufficient documentation

## 2020-05-08 LAB — CBC WITH DIFFERENTIAL (CANCER CENTER ONLY)
Abs Immature Granulocytes: 0.11 10*3/uL — ABNORMAL HIGH (ref 0.00–0.07)
Basophils Absolute: 0 10*3/uL (ref 0.0–0.1)
Basophils Relative: 1 %
Eosinophils Absolute: 0.4 10*3/uL (ref 0.0–0.5)
Eosinophils Relative: 6 %
HCT: 30.2 % — ABNORMAL LOW (ref 36.0–46.0)
Hemoglobin: 9.5 g/dL — ABNORMAL LOW (ref 12.0–15.0)
Immature Granulocytes: 2 %
Lymphocytes Relative: 16 %
Lymphs Abs: 1 10*3/uL (ref 0.7–4.0)
MCH: 30.8 pg (ref 26.0–34.0)
MCHC: 31.5 g/dL (ref 30.0–36.0)
MCV: 98.1 fL (ref 80.0–100.0)
Monocytes Absolute: 1 10*3/uL (ref 0.1–1.0)
Monocytes Relative: 15 %
Neutro Abs: 3.9 10*3/uL (ref 1.7–7.7)
Neutrophils Relative %: 60 %
Platelet Count: 158 10*3/uL (ref 150–400)
RBC: 3.08 MIL/uL — ABNORMAL LOW (ref 3.87–5.11)
RDW: 16 % — ABNORMAL HIGH (ref 11.5–15.5)
WBC Count: 6.5 10*3/uL (ref 4.0–10.5)
nRBC: 0 % (ref 0.0–0.2)

## 2020-05-08 LAB — CMP (CANCER CENTER ONLY)
ALT: 7 U/L (ref 0–44)
AST: 10 U/L — ABNORMAL LOW (ref 15–41)
Albumin: 3.8 g/dL (ref 3.5–5.0)
Alkaline Phosphatase: 54 U/L (ref 38–126)
Anion gap: 7 (ref 5–15)
BUN: 41 mg/dL — ABNORMAL HIGH (ref 8–23)
CO2: 34 mmol/L — ABNORMAL HIGH (ref 22–32)
Calcium: 10.2 mg/dL (ref 8.9–10.3)
Chloride: 99 mmol/L (ref 98–111)
Creatinine: 1.71 mg/dL — ABNORMAL HIGH (ref 0.44–1.00)
GFR, Estimated: 30 mL/min — ABNORMAL LOW (ref >60)
Glucose, Bld: 127 mg/dL — ABNORMAL HIGH (ref 70–99)
Potassium: 5 mmol/L (ref 3.5–5.1)
Sodium: 140 mmol/L (ref 135–145)
Total Bilirubin: 0.5 mg/dL (ref 0.3–1.2)
Total Protein: 7 g/dL (ref 6.5–8.1)

## 2020-05-08 LAB — RETICULOCYTES
Immature Retic Fract: 21.1 % — ABNORMAL HIGH (ref 2.3–15.9)
RBC.: 3.08 MIL/uL — ABNORMAL LOW (ref 3.87–5.11)
Retic Count, Absolute: 55.7 10*3/uL (ref 19.0–186.0)
Retic Ct Pct: 1.8 % (ref 0.4–3.1)

## 2020-05-08 MED ORDER — EPOETIN ALFA-EPBX 40000 UNIT/ML IJ SOLN
40000.0000 [IU] | Freq: Once | INTRAMUSCULAR | Status: AC
  Administered 2020-05-08: 40000 [IU] via SUBCUTANEOUS

## 2020-05-08 NOTE — Patient Instructions (Signed)

## 2020-05-08 NOTE — Progress Notes (Signed)
Hematology and Oncology Follow Up Visit  [name] [id number] 08/16/1940 80 y.o. 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 Hgb is down at 9.5.  She states that she recently had a bad bout with bronchitis and just finished her antibiotic.  She denies noting any blood loss. No abnormal bruising, no petechiae.  She notes that her SOB is stable on supplemental O2 24 hours a day.  No fever, chills, n/v, cough, rash, dizziness, chest pain, palpitations abdominal pain or changes in bowel or bladder habits.  She has noticed fluid retention lately in the chest which she feels is more due to her heart. She plans to contact her cardiologist and notify them. She took an extra lasix several days this week and feel that this helped her symptoms improve.   She has chronic swelling in both lower extremities which she describes as stable. Pedal pulses are 2+.  No falls or syncope to report.  She has maintained a good appetite and is staying properly hydrated throughout the day. Her weight is stable at 185 lbs.   ECOG Performance Status: 2 - Symptomatic, <50% confined to bed  Medications:  Allergies as of 05/08/2020      Reactions   Montelukast Sodium Palpitations, Other (See Comments)   REACTION: HEART PALPITATIONS, CHEST PAIN.   Sulfa Antibiotics Other (See Comments)   CHEST PAIN   Sulfonamide Derivatives Other (See Comments)   CHEST PAIN   Oseltamivir Phosphate Diarrhea, Nausea And Vomiting, Other (See Comments)   TAMIFLU REACTION:  dizziness   Ace Inhibitors Other (See Comments)   PT. STATED UNKNOWN REACTION   Indomethacin Other (See Comments)   PT. STATED UNKNOWN REACTION      Medication List       Accurate as of May 08, 2020  3:28 PM. If you have any questions, ask your  nurse or doctor.        Accu-Chek Aviva Plus test strip Generic drug: glucose blood CHECK BLOOD SUGAR ONE TIME WEEKLY.  DX CODE: E11.9   Accu-Chek Aviva Plus w/Device Kit CHECK BLOOD SUGAR ONE TIME WEEKLY   Accu-Chek Aviva Soln USE AS DIRECTED   Accu-Chek Softclix Lancets lancets CHECK BLOOD SUGAR ONE TIME WEEKLY   acetaminophen 500 MG tablet Commonly known as: TYLENOL Take 500 mg by mouth 2 (two) times daily.   albuterol 108 (90 Base) MCG/ACT inhaler Commonly known as: VENTOLIN HFA Inhale 2 puffs into the lungs every 4 (four) hours as needed for wheezing or shortness of breath.   albuterol 0.63 MG/3ML nebulizer solution Commonly known as: ACCUNEB Take 3 mLs (0.63 mg total) by nebulization every 6 (six) hours as needed for wheezing or shortness of breath.   albuterol (2.5 MG/3ML) 0.083% nebulizer solution Commonly known as: PROVENTIL   Alcohol Wipes 70 % Pads To use before checking blood sugars   allopurinol 100 MG tablet Commonly known as: ZYLOPRIM TAKE 2 TABLETS ONE TIME DAILY   b complex vitamins tablet Take 1 tablet by mouth daily.   benzonatate 100 MG capsule Commonly known as: TESSALON Take 2 capsules (200 mg total) by mouth every 8 (eight) hours.   blood glucose meter kit and supplies Check blood sugars once weekly.   budesonide-formoterol 160-4.5 MCG/ACT inhaler Commonly known as: Symbicort Inhale 2 puffs into the lungs   in the morning and at bedtime.   dextromethorphan-guaiFENesin 30-600 MG 12hr tablet Commonly known as: MUCINEX DM Take 1 tablet by mouth 2 (two) times daily.   docusate sodium 100 MG capsule Commonly known as: COLACE Take 1 capsule (100 mg total) by mouth 2 (two) times daily.   doxycycline 100 MG capsule Commonly known as: VIBRAMYCIN Take 1 capsule (100 mg total) by mouth 2 (two) times daily.   Ferrous Fumarate-Folic Acid 709-6 MG Tabs Commonly known as: Hemocyte-F Take 1 tablet by mouth daily.   fluticasone 50 MCG/ACT nasal  spray Commonly known as: FLONASE Place 2 sprays into both nostrils daily.   furosemide 40 MG tablet Commonly known as: LASIX TAKE 1 TABLET DAILY AND A SECOND TABLET DAILY AS NEEDED FOR INCREASED EDEMA OR WEIGHT GAIN GREATER THAN 3 POUNDS IN 24 HOURS   loratadine 10 MG tablet Commonly known as: CLARITIN Take 1 tablet (10 mg total) by mouth at bedtime. What changed:   when to take this  reasons to take this   Magnesium Oxide 250 MG Tabs Take 1 tablet (250 mg total) by mouth daily.   metoprolol succinate 50 MG 24 hr tablet Commonly known as: TOPROL-XL Take 1.5 tablets (75 mg total) by mouth daily. Take with or immediately following a meal.   montelukast 10 MG tablet Commonly known as: SINGULAIR Take 1 tablet (10 mg total) by mouth at bedtime.   mupirocin ointment 2 % Commonly known as: BACTROBAN Apply 1 application topically 2 (two) times daily.   OXYGEN Inhale 2 L into the lungs continuous.   pantoprazole 40 MG tablet Commonly known as: PROTONIX Take 40 mg by mouth 2 (two) times daily.   polyethylene glycol 17 g packet Commonly known as: MIRALAX / GLYCOLAX Take 17 g by mouth daily.   pravastatin 40 MG tablet Commonly known as: PRAVACHOL Take 1 tablet (40 mg total) by mouth daily.   PROBIOTIC PO Take 1 tablet by mouth daily.   sertraline 100 MG tablet Commonly known as: ZOLOFT Take 1 tablet (100 mg total) by mouth daily.   traMADol 50 MG tablet Commonly known as: ULTRAM TAKE 1 TABLET BY MOUTH THREE TIMES DAILY AS NEEDED FOR MODERATE PAIN   trolamine salicylate 10 % cream Commonly known as: ASPERCREME Apply 1 application topically as needed for muscle pain.   vitamin B-12 1000 MCG tablet Commonly known as: CYANOCOBALAMIN Take 1,000 mcg by mouth daily.       Allergies:  Allergies  Allergen Reactions  . Montelukast Sodium Palpitations and Other (See Comments)    REACTION: HEART PALPITATIONS, CHEST PAIN.  Marland Kitchen Sulfa Antibiotics Other (See Comments)     CHEST PAIN  . Sulfonamide Derivatives Other (See Comments)    CHEST PAIN  . Oseltamivir Phosphate Diarrhea, Nausea And Vomiting and Other (See Comments)    TAMIFLU REACTION:  dizziness  . Ace Inhibitors Other (See Comments)    PT. STATED UNKNOWN REACTION  . Indomethacin Other (See Comments)    PT. STATED UNKNOWN REACTION    Past Medical History, Surgical history, Social history, and Family History were reviewed and updated.  Review of Systems: All other 10 point review of systems is negative.   Physical Exam:  vitals were not taken for this visit.   Wt Readings from Last 3 Encounters:  04/08/20 183 lb (83 kg)  03/07/20 186 lb (84.4 kg)  02/23/20 185 lb 4.8 oz (84.1 kg)    Ocular: Sclerae unicteric, pupils equal, round and reactive to light Ear-nose-throat: Oropharynx  clear, dentition fair Lymphatic: No cervical or supraclavicular adenopathy Lungs no rales or rhonchi, good excursion bilaterally Heart regular rate and rhythm, no murmur appreciated Abd soft, nontender, positive bowel sounds MSK no focal spinal tenderness, no joint edema Neuro: non-focal, well-oriented, appropriate affect Breasts: Deferred   Lab Results  Component Value Date   WBC 12.1 (H) 04/18/2020   HGB 11.1 (L) 04/18/2020   HCT 36.0 04/18/2020   MCV 97.6 04/18/2020   PLT 154 04/18/2020   Lab Results  Component Value Date   FERRITIN 504 (H) 04/18/2020   IRON 119 04/18/2020   TIBC 250 04/18/2020   UIBC 131 04/18/2020   IRONPCTSAT 48 04/18/2020   Lab Results  Component Value Date   RETICCTPCT 1.7 04/18/2020   RBC 3.69 (L) 04/18/2020   RBC 3.67 (L) 04/18/2020   No results found for: KPAFRELGTCHN, LAMBDASER, KAPLAMBRATIO No results found for: IGGSERUM, IGA, IGMSERUM No results found for: TOTALPROTELP, ALBUMINELP, A1GS, A2GS, BETS, BETA2SER, GAMS, MSPIKE, SPEI   Chemistry      Component Value Date/Time   NA 143 04/18/2020 1304   NA 143 01/27/2020 1535   K 4.2 04/18/2020 1304   CL 98  04/18/2020 1304   CO2 37 (H) 04/18/2020 1304   BUN 54 (H) 04/18/2020 1304   BUN 40 (H) 01/27/2020 1535   CREATININE 1.51 (H) 04/18/2020 1304   CREATININE 1.36 (H) 08/23/2013 1134      Component Value Date/Time   CALCIUM 9.9 04/18/2020 1304   ALKPHOS 38 04/18/2020 1304   AST 12 (L) 04/18/2020 1304   ALT 15 04/18/2020 1304   BILITOT 0.6 04/18/2020 1304       Impression and Plan: [name] is a very pleasant 80yo caucasian female with multifactorial anemia. Retacrit given for Hgb 9.5.  Iron studies are pending. We will replace if needed.  Follow-up in 3 weeks.  She can contact our office with any questions or concerns.   [place], NP 4/19/20223:28 PM  

## 2020-05-09 ENCOUNTER — Telehealth: Payer: Self-pay | Admitting: *Deleted

## 2020-05-09 ENCOUNTER — Other Ambulatory Visit: Payer: Self-pay | Admitting: Family

## 2020-05-09 DIAGNOSIS — D509 Iron deficiency anemia, unspecified: Secondary | ICD-10-CM | POA: Insufficient documentation

## 2020-05-09 LAB — IRON AND TIBC
Iron: 39 ug/dL — ABNORMAL LOW (ref 41–142)
Saturation Ratios: 17 % — ABNORMAL LOW (ref 21–57)
TIBC: 235 ug/dL — ABNORMAL LOW (ref 236–444)
UIBC: 196 ug/dL (ref 120–384)

## 2020-05-09 LAB — FERRITIN: Ferritin: 578 ng/mL — ABNORMAL HIGH (ref 11–307)

## 2020-05-09 NOTE — Telephone Encounter (Signed)
Per scheduling message 05/09/20 Judson Roch - called and lvm of upcoming appoints - mailed calendar

## 2020-05-14 ENCOUNTER — Inpatient Hospital Stay: Payer: Medicare HMO

## 2020-05-14 ENCOUNTER — Ambulatory Visit: Payer: Medicare HMO

## 2020-05-14 ENCOUNTER — Telehealth: Payer: Self-pay

## 2020-05-14 DIAGNOSIS — J4520 Mild intermittent asthma, uncomplicated: Secondary | ICD-10-CM | POA: Diagnosis not present

## 2020-05-14 DIAGNOSIS — D631 Anemia in chronic kidney disease: Secondary | ICD-10-CM | POA: Diagnosis not present

## 2020-05-14 DIAGNOSIS — E1122 Type 2 diabetes mellitus with diabetic chronic kidney disease: Secondary | ICD-10-CM | POA: Diagnosis not present

## 2020-05-14 DIAGNOSIS — R0902 Hypoxemia: Secondary | ICD-10-CM | POA: Diagnosis not present

## 2020-05-14 DIAGNOSIS — I129 Hypertensive chronic kidney disease with stage 1 through stage 4 chronic kidney disease, or unspecified chronic kidney disease: Secondary | ICD-10-CM | POA: Diagnosis not present

## 2020-05-14 DIAGNOSIS — N184 Chronic kidney disease, stage 4 (severe): Secondary | ICD-10-CM | POA: Diagnosis not present

## 2020-05-14 NOTE — Telephone Encounter (Signed)
Pt had called and left a vm that she had gotten turned around driving this morning and would be late, she then called back to r/s, appt moved to 05/15/20 and nurses notified   Constantine Ruddick

## 2020-05-15 ENCOUNTER — Other Ambulatory Visit: Payer: Self-pay

## 2020-05-15 ENCOUNTER — Inpatient Hospital Stay: Payer: Medicare HMO

## 2020-05-15 VITALS — BP 98/50 | HR 90 | Temp 97.6°F | Resp 18

## 2020-05-15 DIAGNOSIS — N390 Urinary tract infection, site not specified: Secondary | ICD-10-CM | POA: Diagnosis not present

## 2020-05-15 DIAGNOSIS — N183 Chronic kidney disease, stage 3 unspecified: Secondary | ICD-10-CM | POA: Diagnosis not present

## 2020-05-15 DIAGNOSIS — D631 Anemia in chronic kidney disease: Secondary | ICD-10-CM

## 2020-05-15 DIAGNOSIS — Z79899 Other long term (current) drug therapy: Secondary | ICD-10-CM | POA: Diagnosis not present

## 2020-05-15 DIAGNOSIS — D509 Iron deficiency anemia, unspecified: Secondary | ICD-10-CM

## 2020-05-15 MED ORDER — SODIUM CHLORIDE 0.9 % IV SOLN
200.0000 mg | Freq: Once | INTRAVENOUS | Status: AC
  Administered 2020-05-15: 200 mg via INTRAVENOUS
  Filled 2020-05-15: qty 200

## 2020-05-15 MED ORDER — SODIUM CHLORIDE 0.9 % IV SOLN
Freq: Once | INTRAVENOUS | Status: AC
Start: 2020-05-15 — End: 2020-05-15
  Filled 2020-05-15: qty 250

## 2020-05-15 NOTE — Patient Instructions (Signed)
Iron Sucrose injection (Venofer) What is this medicine? IRON SUCROSE (AHY ern SOO krohs) is an iron complex. Iron is used to make healthy red blood cells, which carry oxygen and nutrients throughout the body. This medicine is used to treat iron deficiency anemia in people with chronic kidney disease. This medicine may be used for other purposes; ask your health care provider or pharmacist if you have questions. COMMON BRAND NAME(S): Venofer What should I tell my health care provider before I take this medicine? They need to know if you have any of these conditions:  anemia not caused by low iron levels  heart disease  high levels of iron in the blood  kidney disease  liver disease  an unusual or allergic reaction to iron, other medicines, foods, dyes, or preservatives  pregnant or trying to get pregnant  breast-feeding How should I use this medicine? This medicine is for infusion into a vein. It is given by a health care professional in a hospital or clinic setting. Talk to your pediatrician regarding the use of this medicine in children. While this drug may be prescribed for children as young as 2 years for selected conditions, precautions do apply. Overdosage: If you think you have taken too much of this medicine contact a poison control center or emergency room at once. NOTE: This medicine is only for you. Do not share this medicine with others. What if I miss a dose? It is important not to miss your dose. Call your doctor or health care professional if you are unable to keep an appointment. What may interact with this medicine? Do not take this medicine with any of the following medications:  deferoxamine  dimercaprol  other iron products This medicine may also interact with the following medications:  chloramphenicol  deferasirox This list may not describe all possible interactions. Give your health care provider a list of all the medicines, herbs, non-prescription  drugs, or dietary supplements you use. Also tell them if you smoke, drink alcohol, or use illegal drugs. Some items may interact with your medicine. What should I watch for while using this medicine? Visit your doctor or healthcare professional regularly. Tell your doctor or healthcare professional if your symptoms do not start to get better or if they get worse. You may need blood work done while you are taking this medicine. You may need to follow a special diet. Talk to your doctor. Foods that contain iron include: whole grains/cereals, dried fruits, beans, or peas, leafy green vegetables, and organ meats (liver, kidney). What side effects may I notice from receiving this medicine? Side effects that you should report to your doctor or health care professional as soon as possible:  allergic reactions like skin rash, itching or hives, swelling of the face, lips, or tongue  breathing problems  changes in blood pressure  cough  fast, irregular heartbeat  feeling faint or lightheaded, falls  fever or chills  flushing, sweating, or hot feelings  joint or muscle aches/pains  seizures  swelling of the ankles or feet  unusually weak or tired Side effects that usually do not require medical attention (report to your doctor or health care professional if they continue or are bothersome):  diarrhea  feeling achy  headache  irritation at site where injected  nausea, vomiting  stomach upset  tiredness This list may not describe all possible side effects. Call your doctor for medical advice about side effects. You may report side effects to FDA at 1-800-FDA-1088. Where should I   keep my medicine? This drug is given in a hospital or clinic and will not be stored at home. NOTE: This sheet is a summary. It may not cover all possible information. If you have questions about this medicine, talk to your doctor, pharmacist, or health care provider.  2021 Elsevier/Gold Standard  (2010-10-17 17:14:35)  

## 2020-05-16 ENCOUNTER — Telehealth: Payer: Self-pay | Admitting: Pulmonary Disease

## 2020-05-16 MED ORDER — BUDESONIDE-FORMOTEROL FUMARATE 160-4.5 MCG/ACT IN AERO: 2.0000 | INHALATION_SPRAY | Freq: Two times a day (BID) | RESPIRATORY_TRACT | 0 refills | Status: DC

## 2020-05-16 NOTE — Telephone Encounter (Signed)
Called and spoke with pt and she is aware that we do not have any samples of the symbicort in the office.  She stated that she has ordered from her mail order but she has not heard anything from them and this was 10 days ago.   I have sent 1 inhaler to the local pharmacy per her request. Nothing further is needed.

## 2020-05-21 ENCOUNTER — Inpatient Hospital Stay: Payer: Medicare HMO | Attending: Family

## 2020-05-21 ENCOUNTER — Other Ambulatory Visit: Payer: Self-pay

## 2020-05-21 VITALS — BP 117/53 | HR 69 | Temp 99.4°F | Resp 18

## 2020-05-21 DIAGNOSIS — D631 Anemia in chronic kidney disease: Secondary | ICD-10-CM | POA: Diagnosis not present

## 2020-05-21 DIAGNOSIS — D509 Iron deficiency anemia, unspecified: Secondary | ICD-10-CM | POA: Diagnosis not present

## 2020-05-21 DIAGNOSIS — E611 Iron deficiency: Secondary | ICD-10-CM | POA: Diagnosis not present

## 2020-05-21 DIAGNOSIS — Z79899 Other long term (current) drug therapy: Secondary | ICD-10-CM | POA: Diagnosis not present

## 2020-05-21 DIAGNOSIS — N183 Chronic kidney disease, stage 3 unspecified: Secondary | ICD-10-CM | POA: Diagnosis not present

## 2020-05-21 MED ORDER — SODIUM CHLORIDE 0.9 % IV SOLN
Freq: Once | INTRAVENOUS | Status: AC
  Filled 2020-05-21: qty 250

## 2020-05-21 MED ORDER — SODIUM CHLORIDE 0.9 % IV SOLN
200.0000 mg | Freq: Once | INTRAVENOUS | Status: AC
  Administered 2020-05-21: 200 mg via INTRAVENOUS
  Filled 2020-05-21: qty 200

## 2020-05-21 NOTE — Patient Instructions (Signed)

## 2020-05-30 ENCOUNTER — Other Ambulatory Visit: Payer: Self-pay | Admitting: Family Medicine

## 2020-05-30 NOTE — Telephone Encounter (Signed)
Requesting: tramadol 50mg   Contract: 12/23/2016 UDS: 12/23/2016 Last Visit: 02/23/20 Next Visit: 06/26/20 Last Refill: 04/16/2020 #90 and 0RF Pt sig: 1 tab tid prn  Please Advise

## 2020-06-04 ENCOUNTER — Other Ambulatory Visit: Payer: Self-pay

## 2020-06-04 ENCOUNTER — Inpatient Hospital Stay (HOSPITAL_BASED_OUTPATIENT_CLINIC_OR_DEPARTMENT_OTHER): Payer: Medicare HMO | Admitting: Family

## 2020-06-04 ENCOUNTER — Inpatient Hospital Stay: Payer: Medicare HMO

## 2020-06-04 ENCOUNTER — Encounter: Payer: Self-pay | Admitting: Family

## 2020-06-04 VITALS — BP 109/41 | HR 73 | Temp 98.3°F | Resp 18 | Wt 185.4 lb

## 2020-06-04 DIAGNOSIS — D631 Anemia in chronic kidney disease: Secondary | ICD-10-CM | POA: Diagnosis not present

## 2020-06-04 DIAGNOSIS — N183 Chronic kidney disease, stage 3 unspecified: Secondary | ICD-10-CM | POA: Diagnosis not present

## 2020-06-04 DIAGNOSIS — E611 Iron deficiency: Secondary | ICD-10-CM | POA: Diagnosis not present

## 2020-06-04 DIAGNOSIS — N184 Chronic kidney disease, stage 4 (severe): Secondary | ICD-10-CM

## 2020-06-04 DIAGNOSIS — D508 Other iron deficiency anemias: Secondary | ICD-10-CM

## 2020-06-04 DIAGNOSIS — D509 Iron deficiency anemia, unspecified: Secondary | ICD-10-CM | POA: Diagnosis not present

## 2020-06-04 DIAGNOSIS — Z79899 Other long term (current) drug therapy: Secondary | ICD-10-CM | POA: Diagnosis not present

## 2020-06-04 LAB — CBC WITH DIFFERENTIAL (CANCER CENTER ONLY)
Abs Immature Granulocytes: 0.02 10*3/uL (ref 0.00–0.07)
Basophils Absolute: 0 10*3/uL (ref 0.0–0.1)
Basophils Relative: 1 %
Eosinophils Absolute: 0.5 10*3/uL (ref 0.0–0.5)
Eosinophils Relative: 8 %
HCT: 32 % — ABNORMAL LOW (ref 36.0–46.0)
Hemoglobin: 10 g/dL — ABNORMAL LOW (ref 12.0–15.0)
Immature Granulocytes: 0 %
Lymphocytes Relative: 18 %
Lymphs Abs: 1.1 10*3/uL (ref 0.7–4.0)
MCH: 31.3 pg (ref 26.0–34.0)
MCHC: 31.3 g/dL (ref 30.0–36.0)
MCV: 100 fL (ref 80.0–100.0)
Monocytes Absolute: 0.8 10*3/uL (ref 0.1–1.0)
Monocytes Relative: 13 %
Neutro Abs: 3.8 10*3/uL (ref 1.7–7.7)
Neutrophils Relative %: 60 %
Platelet Count: 141 10*3/uL — ABNORMAL LOW (ref 150–400)
RBC: 3.2 MIL/uL — ABNORMAL LOW (ref 3.87–5.11)
RDW: 15.8 % — ABNORMAL HIGH (ref 11.5–15.5)
WBC Count: 6.3 10*3/uL (ref 4.0–10.5)
nRBC: 0 % (ref 0.0–0.2)

## 2020-06-04 LAB — CMP (CANCER CENTER ONLY)
ALT: 7 U/L (ref 0–44)
AST: 13 U/L — ABNORMAL LOW (ref 15–41)
Albumin: 4.1 g/dL (ref 3.5–5.0)
Alkaline Phosphatase: 54 U/L (ref 38–126)
Anion gap: 8 (ref 5–15)
BUN: 30 mg/dL — ABNORMAL HIGH (ref 8–23)
CO2: 33 mmol/L — ABNORMAL HIGH (ref 22–32)
Calcium: 10.1 mg/dL (ref 8.9–10.3)
Chloride: 100 mmol/L (ref 98–111)
Creatinine: 1.6 mg/dL — ABNORMAL HIGH (ref 0.44–1.00)
GFR, Estimated: 33 mL/min — ABNORMAL LOW (ref >60)
Glucose, Bld: 147 mg/dL — ABNORMAL HIGH (ref 70–99)
Potassium: 4.4 mmol/L (ref 3.5–5.1)
Sodium: 141 mmol/L (ref 135–145)
Total Bilirubin: 0.4 mg/dL (ref 0.3–1.2)
Total Protein: 6.5 g/dL (ref 6.5–8.1)

## 2020-06-04 LAB — RETICULOCYTES
Immature Retic Fract: 15 % (ref 2.3–15.9)
RBC.: 3.21 MIL/uL — ABNORMAL LOW (ref 3.87–5.11)
Retic Count, Absolute: 60.3 10*3/uL (ref 19.0–186.0)
Retic Ct Pct: 1.9 % (ref 0.4–3.1)

## 2020-06-04 MED ORDER — EPOETIN ALFA-EPBX 40000 UNIT/ML IJ SOLN
INTRAMUSCULAR | Status: AC
  Filled 2020-06-04: qty 1

## 2020-06-04 MED ORDER — EPOETIN ALFA-EPBX 40000 UNIT/ML IJ SOLN
40000.0000 [IU] | Freq: Once | INTRAMUSCULAR | Status: AC
  Administered 2020-06-04: 40000 [IU] via SUBCUTANEOUS

## 2020-06-04 NOTE — Progress Notes (Signed)
Hematology and Oncology Follow Up Visit  [name] [id number] [birth year] 80 y.o. 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 Hgb today is 10.0.  She is doing well and has no complaints at this times.  No issues with blood loss. No bruising or petechiae.  She has had some sinus congestion and drainage with sinus congestion and has also had some occasional dizziness.  No falls or syncope to report. She ambulates with a Rolator for added support.  She has swelling in her feet and ankles which she states is exacerbated when she eats salt.  Pedal pulses are 2+. No tenderness, numbness or tingling in her extremities.  No fever, chills, n/v, cough, rash, chest pain, palpitations, abdominal pain or changes in bowel or bladder habits.  She takes stool softeners regularly to prevent constipation.  She states that her SOB is stable on her 2L supplement O2 24 hours a day.  She has a good appetite and is staying well hydrated throughout the day. Her weight is stable at 185 lbs.   ECOG Performance Status: 1 - Symptomatic but completely ambulatory  Medications:  Allergies as of 06/04/2020      Reactions   Montelukast Sodium Palpitations, Other (See Comments)   REACTION: HEART PALPITATIONS, CHEST PAIN.   Sulfa Antibiotics Other (See Comments)   CHEST PAIN   Sulfonamide Derivatives Other (See Comments)   CHEST PAIN   Oseltamivir Phosphate Diarrhea, Nausea And Vomiting, Other (See Comments)   TAMIFLU REACTION:  dizziness   Ace Inhibitors Other (See Comments)   PT. STATED UNKNOWN REACTION   Indomethacin Other (See Comments)   PT. STATED UNKNOWN REACTION      Medication List       Accurate as of Jun 04, 2020  3:02 PM. If you have any questions, ask your nurse or doctor.         Accu-Chek Aviva Plus test strip Generic drug: glucose blood CHECK BLOOD SUGAR ONE TIME WEEKLY.  DX CODE: E11.9   Accu-Chek Aviva Plus w/Device Kit CHECK BLOOD SUGAR ONE TIME WEEKLY   Accu-Chek Aviva Soln USE AS DIRECTED   Accu-Chek Softclix Lancets lancets CHECK BLOOD SUGAR ONE TIME WEEKLY   acetaminophen 500 MG tablet Commonly known as: TYLENOL Take 500 mg by mouth 2 (two) times daily.   albuterol 108 (90 Base) MCG/ACT inhaler Commonly known as: VENTOLIN HFA Inhale 2 puffs into the lungs every 4 (four) hours as needed for wheezing or shortness of breath.   albuterol 0.63 MG/3ML nebulizer solution Commonly known as: ACCUNEB Take 3 mLs (0.63 mg total) by nebulization every 6 (six) hours as needed for wheezing or shortness of breath.   albuterol (2.5 MG/3ML) 0.083% nebulizer solution Commonly known as: PROVENTIL   Alcohol Wipes 70 % Pads To use before checking blood sugars   allopurinol 100 MG tablet Commonly known as: ZYLOPRIM TAKE 2 TABLETS ONE TIME DAILY   b complex vitamins tablet Take 1 tablet by mouth daily.   benzonatate 100 MG capsule Commonly known as: TESSALON Take 2 capsules (200 mg total) by mouth every 8 (eight) hours.   blood glucose meter kit and supplies Check blood sugars once weekly.   budesonide-formoterol 160-4.5 MCG/ACT inhaler Commonly known as: Symbicort Inhale 2 puffs into the lungs in the morning and at  bedtime.   dextromethorphan-guaiFENesin 30-600 MG 12hr tablet Commonly known as: MUCINEX DM Take 1 tablet by mouth 2 (two) times daily.   docusate sodium 100 MG capsule Commonly known as: COLACE Take 1 capsule (100 mg total) by mouth 2 (two) times daily.   doxycycline 100 MG capsule Commonly known as: VIBRAMYCIN Take 1 capsule (100 mg total) by mouth 2 (two) times daily.   Ferrous Fumarate-Folic Acid 937-1 MG Tabs Commonly known as: Hemocyte-F Take 1 tablet by mouth daily.   fluticasone 50 MCG/ACT nasal spray Commonly  known as: FLONASE Place 2 sprays into both nostrils daily.   Fluzone High-Dose Quadrivalent 0.7 ML Susy Generic drug: Influenza Vac High-Dose Quad   furosemide 40 MG tablet Commonly known as: LASIX TAKE 1 TABLET DAILY AND A SECOND TABLET DAILY AS NEEDED FOR INCREASED EDEMA OR WEIGHT GAIN GREATER THAN 3 POUNDS IN 24 HOURS   loratadine 10 MG tablet Commonly known as: CLARITIN Take 1 tablet (10 mg total) by mouth at bedtime. What changed:   when to take this  reasons to take this   Magnesium Oxide 250 MG Tabs Take 1 tablet (250 mg total) by mouth daily.   metoprolol succinate 50 MG 24 hr tablet Commonly known as: TOPROL-XL Take 1.5 tablets (75 mg total) by mouth daily. Take with or immediately following a meal.   montelukast 10 MG tablet Commonly known as: SINGULAIR Take 1 tablet (10 mg total) by mouth at bedtime.   mupirocin ointment 2 % Commonly known as: BACTROBAN Apply 1 application topically 2 (two) times daily.   OXYGEN Inhale 2 L into the lungs continuous.   pantoprazole 40 MG tablet Commonly known as: PROTONIX Take 40 mg by mouth 2 (two) times daily.   polyethylene glycol 17 g packet Commonly known as: MIRALAX / GLYCOLAX Take 17 g by mouth daily.   pravastatin 40 MG tablet Commonly known as: PRAVACHOL Take 1 tablet (40 mg total) by mouth daily.   PROBIOTIC PO Take 1 tablet by mouth daily.   sertraline 100 MG tablet Commonly known as: ZOLOFT Take 1 tablet (100 mg total) by mouth daily.   traMADol 50 MG tablet Commonly known as: ULTRAM TAKE 1 TABLET BY MOUTH THREE TIMES DAILY AS NEEDED FOR MODERATE PAIN   trolamine salicylate 10 % cream Commonly known as: ASPERCREME Apply 1 application topically as needed for muscle pain.   vitamin B-12 1000 MCG tablet Commonly known as: CYANOCOBALAMIN Take 1,000 mcg by mouth daily.       Allergies:  Allergies  Allergen Reactions  . Montelukast Sodium Palpitations and Other (See Comments)    REACTION:  HEART PALPITATIONS, CHEST PAIN.  Marland Kitchen Sulfa Antibiotics Other (See Comments)    CHEST PAIN  . Sulfonamide Derivatives Other (See Comments)    CHEST PAIN  . Oseltamivir Phosphate Diarrhea, Nausea And Vomiting and Other (See Comments)    TAMIFLU REACTION:  dizziness  . Ace Inhibitors Other (See Comments)    PT. STATED UNKNOWN REACTION  . Indomethacin Other (See Comments)    PT. STATED UNKNOWN REACTION    Past Medical History, Surgical history, Social history, and Family History were reviewed and updated.  Review of Systems: All other 10 point review of systems is negative.   Physical Exam:  vitals were not taken for this visit.   Wt Readings from Last 3 Encounters:  05/08/20 185 lb (83.9 kg)  04/08/20 183 lb (83 kg)  03/07/20 186 lb (84.4 kg)    Ocular: Sclerae unicteric, pupils equal,  round and reactive to light Ear-nose-throat: Oropharynx clear, dentition fair Lymphatic: No cervical or supraclavicular adenopathy Lungs no rales or rhonchi, good excursion bilaterally Heart regular rate and rhythm, no murmur appreciated Abd soft, nontender, positive bowel sounds MSK no focal spinal tenderness, no joint edema Neuro: non-focal, well-oriented, appropriate affect Breasts: Deferred   Lab Results  Component Value Date   WBC 6.5 05/08/2020   HGB 9.5 (L) 05/08/2020   HCT 30.2 (L) 05/08/2020   MCV 98.1 05/08/2020   PLT 158 05/08/2020   Lab Results  Component Value Date   FERRITIN 578 (H) 05/08/2020   IRON 39 (L) 05/08/2020   TIBC 235 (L) 05/08/2020   UIBC 196 05/08/2020   IRONPCTSAT 17 (L) 05/08/2020   Lab Results  Component Value Date   RETICCTPCT 1.9 06/04/2020   RBC 3.21 (L) 06/04/2020   No results found for: KPAFRELGTCHN, LAMBDASER, KAPLAMBRATIO No results found for: IGGSERUM, IGA, IGMSERUM No results found for: Ronnald Ramp, A1GS, A2GS, Violet Baldy, MSPIKE, SPEI   Chemistry      Component Value Date/Time   NA 140 05/08/2020 1514   NA 143  01/27/2020 1535   K 5.0 05/08/2020 1514   CL 99 05/08/2020 1514   CO2 34 (H) 05/08/2020 1514   BUN 41 (H) 05/08/2020 1514   BUN 40 (H) 01/27/2020 1535   CREATININE 1.71 (H) 05/08/2020 1514   CREATININE 1.36 (H) 08/23/2013 1134      Component Value Date/Time   CALCIUM 10.2 05/08/2020 1514   ALKPHOS 54 05/08/2020 1514   AST 10 (L) 05/08/2020 1514   ALT 7 05/08/2020 1514   BILITOT 0.5 05/08/2020 1514       Impression and Plan: [name] is a very pleasant 80yo caucasian female with multifactorial anemia. ESA given for Hgb 10.0.  Iron studies pending. Will replace if needed.  Follow-up in 3 weeks.  Can contact our office with any questions or concerns.   Laverna Peace, NP 5/16/20223:02 PM

## 2020-06-04 NOTE — Patient Instructions (Signed)

## 2020-06-05 ENCOUNTER — Telehealth: Payer: Self-pay | Admitting: *Deleted

## 2020-06-05 ENCOUNTER — Telehealth: Payer: Self-pay

## 2020-06-05 LAB — IRON AND TIBC
Iron: 50 ug/dL (ref 41–142)
Saturation Ratios: 20 % — ABNORMAL LOW (ref 21–57)
TIBC: 245 ug/dL (ref 236–444)
UIBC: 195 ug/dL (ref 120–384)

## 2020-06-05 LAB — FERRITIN: Ferritin: 655 ng/mL — ABNORMAL HIGH (ref 11–307)

## 2020-06-05 NOTE — Telephone Encounter (Signed)
Called and left a vm that pt needs to sch two doses of iv iron     [name]

## 2020-06-05 NOTE — Telephone Encounter (Signed)
Per 06/04/20 los - called and gave upcoming appointments - mailed calendar

## 2020-06-13 DIAGNOSIS — J4520 Mild intermittent asthma, uncomplicated: Secondary | ICD-10-CM | POA: Diagnosis not present

## 2020-06-13 DIAGNOSIS — R0902 Hypoxemia: Secondary | ICD-10-CM | POA: Diagnosis not present

## 2020-06-25 ENCOUNTER — Inpatient Hospital Stay: Payer: Medicare HMO

## 2020-06-25 ENCOUNTER — Other Ambulatory Visit: Payer: Self-pay

## 2020-06-25 ENCOUNTER — Inpatient Hospital Stay: Payer: Medicare HMO | Admitting: Family

## 2020-06-25 ENCOUNTER — Inpatient Hospital Stay: Payer: Medicare HMO | Attending: Family Medicine

## 2020-06-25 VITALS — BP 125/66 | HR 78 | Temp 98.5°F | Resp 18 | Wt 181.0 lb

## 2020-06-25 DIAGNOSIS — Z79899 Other long term (current) drug therapy: Secondary | ICD-10-CM

## 2020-06-25 DIAGNOSIS — D509 Iron deficiency anemia, unspecified: Secondary | ICD-10-CM

## 2020-06-25 DIAGNOSIS — D631 Anemia in chronic kidney disease: Secondary | ICD-10-CM | POA: Insufficient documentation

## 2020-06-25 DIAGNOSIS — N183 Chronic kidney disease, stage 3 unspecified: Secondary | ICD-10-CM | POA: Insufficient documentation

## 2020-06-25 LAB — CBC WITH DIFFERENTIAL (CANCER CENTER ONLY)
Abs Immature Granulocytes: 0.02 10*3/uL (ref 0.00–0.07)
Basophils Absolute: 0 10*3/uL (ref 0.0–0.1)
Basophils Relative: 0 %
Eosinophils Absolute: 0.4 10*3/uL (ref 0.0–0.5)
Eosinophils Relative: 6 %
HCT: 35.1 % — ABNORMAL LOW (ref 36.0–46.0)
Hemoglobin: 11.1 g/dL — ABNORMAL LOW (ref 12.0–15.0)
Immature Granulocytes: 0 %
Lymphocytes Relative: 19 %
Lymphs Abs: 1.3 10*3/uL (ref 0.7–4.0)
MCH: 31.5 pg (ref 26.0–34.0)
MCHC: 31.6 g/dL (ref 30.0–36.0)
MCV: 99.7 fL (ref 80.0–100.0)
Monocytes Absolute: 0.7 10*3/uL (ref 0.1–1.0)
Monocytes Relative: 10 %
Neutro Abs: 4.3 10*3/uL (ref 1.7–7.7)
Neutrophils Relative %: 65 %
Platelet Count: 127 10*3/uL — ABNORMAL LOW (ref 150–400)
RBC: 3.52 MIL/uL — ABNORMAL LOW (ref 3.87–5.11)
RDW: 14.5 % (ref 11.5–15.5)
WBC Count: 6.8 10*3/uL (ref 4.0–10.5)
nRBC: 0 % (ref 0.0–0.2)

## 2020-06-25 LAB — CMP (CANCER CENTER ONLY)
ALT: 7 U/L (ref 0–44)
AST: 12 U/L — ABNORMAL LOW (ref 15–41)
Albumin: 4.4 g/dL (ref 3.5–5.0)
Alkaline Phosphatase: 47 U/L (ref 38–126)
Anion gap: 8 (ref 5–15)
BUN: 44 mg/dL — ABNORMAL HIGH (ref 8–23)
CO2: 35 mmol/L — ABNORMAL HIGH (ref 22–32)
Calcium: 10.8 mg/dL — ABNORMAL HIGH (ref 8.9–10.3)
Chloride: 96 mmol/L — ABNORMAL LOW (ref 98–111)
Creatinine: 2.01 mg/dL — ABNORMAL HIGH (ref 0.44–1.00)
GFR, Estimated: 25 mL/min — ABNORMAL LOW (ref >60)
Glucose, Bld: 104 mg/dL — ABNORMAL HIGH (ref 70–99)
Potassium: 4.3 mmol/L (ref 3.5–5.1)
Sodium: 139 mmol/L (ref 135–145)
Total Bilirubin: 0.7 mg/dL (ref 0.3–1.2)
Total Protein: 7.1 g/dL (ref 6.5–8.1)

## 2020-06-25 LAB — RETICULOCYTES
Immature Retic Fract: 11.6 % (ref 2.3–15.9)
RBC.: 3.56 MIL/uL — ABNORMAL LOW (ref 3.87–5.11)
Retic Count, Absolute: 39.9 10*3/uL (ref 19.0–186.0)
Retic Ct Pct: 1.1 % (ref 0.4–3.1)

## 2020-06-25 NOTE — Progress Notes (Signed)
Hematology and Oncology Follow Up Visit  [name] [id number] 08/24/1940 80 y.o. 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 Hgb is stable at 11.1, MCV 99.  She has fatigue at times.  Her SOB has remained stable on 1-2 L [place] supplemental O2 24 hours a day.  No fever, chills, n/v, cough, rash, chest pain, palpitations, abdominal pain or changes in bowel or bladder habits.  No episodes of bleeding. No bruising or petechiae.  No swelling in her lower extremities is unchanged. She is currently on Lasix daily and states this is managed by her PCP.  Pedal pulses are 2+.  No numbness or tingling in her extremities at this time.  No falls or syncope to report. She ambulates with her Rolator for added support.  She has maintained a good appetite and is staying properly hydrated throughout the day. Her weight is stable at 181 lbs.   ECOG Performance Status: 1 - Symptomatic but completely ambulatory  Medications:  Allergies as of 06/25/2020      Reactions   Montelukast Sodium Palpitations, Other (See Comments)   REACTION: HEART PALPITATIONS, CHEST PAIN.   Sulfa Antibiotics Other (See Comments)   CHEST PAIN   Sulfonamide Derivatives Other (See Comments)   CHEST PAIN   Oseltamivir Phosphate Diarrhea, Nausea And Vomiting, Other (See Comments)   TAMIFLU REACTION:  dizziness   Ace Inhibitors Other (See Comments)   PT. STATED UNKNOWN REACTION   Indomethacin Other (See Comments)   PT. STATED UNKNOWN REACTION      Medication List       Accurate as of June 25, 2020 11:39 AM. If you have any questions, ask your nurse or doctor.        Accu-Chek Aviva Plus test strip Generic drug: glucose blood CHECK BLOOD SUGAR ONE TIME WEEKLY.  DX CODE: E11.9   Accu-Chek Aviva Plus w/Device Kit CHECK BLOOD  SUGAR ONE TIME WEEKLY   Accu-Chek Aviva Soln USE AS DIRECTED   Accu-Chek Softclix Lancets lancets CHECK BLOOD SUGAR ONE TIME WEEKLY   acetaminophen 500 MG tablet Commonly known as: TYLENOL Take 500 mg by mouth 2 (two) times daily.   albuterol 108 (90 Base) MCG/ACT inhaler Commonly known as: VENTOLIN HFA Inhale 2 puffs into the lungs every 4 (four) hours as needed for wheezing or shortness of breath.   albuterol 0.63 MG/3ML nebulizer solution Commonly known as: ACCUNEB Take 3 mLs (0.63 mg total) by nebulization every 6 (six) hours as needed for wheezing or shortness of breath.   albuterol (2.5 MG/3ML) 0.083% nebulizer solution Commonly known as: PROVENTIL   Alcohol Wipes 70 % Pads To use before checking blood sugars   allopurinol 100 MG tablet Commonly known as: ZYLOPRIM TAKE 2 TABLETS ONE TIME DAILY   b complex vitamins tablet Take 1 tablet by mouth daily.   benzonatate 100 MG capsule Commonly known as: TESSALON Take 2 capsules (200 mg total) by mouth every 8 (eight) hours.   blood glucose meter kit and supplies Check blood sugars once weekly.   budesonide-formoterol 160-4.5 MCG/ACT inhaler Commonly known as: Symbicort Inhale 2 puffs into the lungs in the morning and at bedtime.   dextromethorphan-guaiFENesin 30-600 MG 12hr tablet Commonly known as: MUCINEX DM Take 1 tablet by mouth 2 (two) times daily.   docusate sodium 100 MG  capsule Commonly known as: COLACE Take 1 capsule (100 mg total) by mouth 2 (two) times daily.   doxycycline 100 MG capsule Commonly known as: VIBRAMYCIN Take 1 capsule (100 mg total) by mouth 2 (two) times daily.   Ferrous Fumarate-Folic Acid 945-8 MG Tabs Commonly known as: Hemocyte-F Take 1 tablet by mouth daily.   fluticasone 50 MCG/ACT nasal spray Commonly known as: FLONASE Place 2 sprays into both nostrils daily.   Fluzone High-Dose Quadrivalent 0.7 ML Susy Generic drug: Influenza Vac High-Dose Quad   furosemide 40 MG  tablet Commonly known as: LASIX TAKE 1 TABLET DAILY AND A SECOND TABLET DAILY AS NEEDED FOR INCREASED EDEMA OR WEIGHT GAIN GREATER THAN 3 POUNDS IN 24 HOURS   loratadine 10 MG tablet Commonly known as: CLARITIN Take 1 tablet (10 mg total) by mouth at bedtime. What changed:   when to take this  reasons to take this   Magnesium Oxide 250 MG Tabs Take 1 tablet (250 mg total) by mouth daily.   metoprolol succinate 50 MG 24 hr tablet Commonly known as: TOPROL-XL Take 1.5 tablets (75 mg total) by mouth daily. Take with or immediately following a meal.   montelukast 10 MG tablet Commonly known as: SINGULAIR Take 1 tablet (10 mg total) by mouth at bedtime.   mupirocin ointment 2 % Commonly known as: BACTROBAN Apply 1 application topically 2 (two) times daily.   OXYGEN Inhale 2 L into the lungs continuous.   pantoprazole 40 MG tablet Commonly known as: PROTONIX Take 40 mg by mouth 2 (two) times daily.   polyethylene glycol 17 g packet Commonly known as: MIRALAX / GLYCOLAX Take 17 g by mouth daily.   pravastatin 40 MG tablet Commonly known as: PRAVACHOL Take 1 tablet (40 mg total) by mouth daily.   PROBIOTIC PO Take 1 tablet by mouth daily.   sertraline 100 MG tablet Commonly known as: ZOLOFT Take 1 tablet (100 mg total) by mouth daily.   traMADol 50 MG tablet Commonly known as: ULTRAM TAKE 1 TABLET BY MOUTH THREE TIMES DAILY AS NEEDED FOR MODERATE PAIN   trolamine salicylate 10 % cream Commonly known as: ASPERCREME Apply 1 application topically as needed for muscle pain.   vitamin B-12 1000 MCG tablet Commonly known as: CYANOCOBALAMIN Take 1,000 mcg by mouth daily.       Allergies:  Allergies  Allergen Reactions  . Montelukast Sodium Palpitations and Other (See Comments)    REACTION: HEART PALPITATIONS, CHEST PAIN.  Marland Kitchen Sulfa Antibiotics Other (See Comments)    CHEST PAIN  . Sulfonamide Derivatives Other (See Comments)    CHEST PAIN  . Oseltamivir  Phosphate Diarrhea, Nausea And Vomiting and Other (See Comments)    TAMIFLU REACTION:  dizziness  . Ace Inhibitors Other (See Comments)    PT. STATED UNKNOWN REACTION  . Indomethacin Other (See Comments)    PT. STATED UNKNOWN REACTION    Past Medical History, Surgical history, Social history, and Family History were reviewed and updated.  Review of Systems: All other 10 point review of systems is negative.   Physical Exam:  weight is 181 lb (82.1 kg). Her oral temperature is 98.5 F (36.9 C). Her blood pressure is 125/66 and her pulse is 78. Her respiration is 18 and oxygen saturation is 93%.   Wt Readings from Last 3 Encounters:  06/25/20 181 lb (82.1 kg)  06/04/20 185 lb 6.4 oz (84.1 kg)  05/08/20 185 lb (83.9 kg)    Ocular: Sclerae unicteric, pupils equal,  round and reactive to light Ear-nose-throat: Oropharynx clear, dentition fair Lymphatic: No cervical or supraclavicular adenopathy Lungs no rales or rhonchi, good excursion bilaterally Heart regular rate and rhythm, no murmur appreciated Abd soft, nontender, positive bowel sounds MSK no focal spinal tenderness, no joint edema Neuro: non-focal, well-oriented, appropriate affect Breasts: Deferred   Lab Results  Component Value Date   WBC 6.8 06/25/2020   HGB 11.1 (L) 06/25/2020   HCT 35.1 (L) 06/25/2020   MCV 99.7 06/25/2020   PLT 127 (L) 06/25/2020   Lab Results  Component Value Date   FERRITIN 655 (H) 06/04/2020   IRON 50 06/04/2020   TIBC 245 06/04/2020   UIBC 195 06/04/2020   IRONPCTSAT 20 (L) 06/04/2020   Lab Results  Component Value Date   RETICCTPCT 1.1 06/25/2020   RBC 3.52 (L) 06/25/2020   No results found for: KPAFRELGTCHN, LAMBDASER, KAPLAMBRATIO No results found for: IGGSERUM, IGA, IGMSERUM No results found for: Kathrynn Ducking, MSPIKE, SPEI   Chemistry      Component Value Date/Time   NA 139 06/25/2020 1059   NA 143 01/27/2020 1535   K 4.3  06/25/2020 1059   CL 96 (L) 06/25/2020 1059   CO2 35 (H) 06/25/2020 1059   BUN 44 (H) 06/25/2020 1059   BUN 40 (H) 01/27/2020 1535   CREATININE 2.01 (H) 06/25/2020 1059   CREATININE 1.36 (H) 08/23/2013 1134      Component Value Date/Time   CALCIUM 10.8 (H) 06/25/2020 1059   ALKPHOS 47 06/25/2020 1059   AST 12 (L) 06/25/2020 1059   ALT 7 06/25/2020 1059   BILITOT 0.7 06/25/2020 1059       Impression and Plan: Ms. Nuzzo is a very pleasant 80yo caucasian female with multifactorial anemia. No ESA needed, Hgb 11.1.  Iron studies pending. We will replace if needed.  Lab and injection in 3 weeks with follow-up in 6 weeks.  She can contact our office with any questions or concerns.   Laverna Peace, NP 6/6/202211:39 AM\

## 2020-06-26 ENCOUNTER — Other Ambulatory Visit: Payer: Self-pay

## 2020-06-26 ENCOUNTER — Ambulatory Visit (INDEPENDENT_AMBULATORY_CARE_PROVIDER_SITE_OTHER): Payer: Medicare HMO | Admitting: Family Medicine

## 2020-06-26 ENCOUNTER — Encounter: Payer: Self-pay | Admitting: Family Medicine

## 2020-06-26 DIAGNOSIS — B958 Unspecified staphylococcus as the cause of diseases classified elsewhere: Secondary | ICD-10-CM | POA: Diagnosis not present

## 2020-06-26 DIAGNOSIS — E559 Vitamin D deficiency, unspecified: Secondary | ICD-10-CM

## 2020-06-26 DIAGNOSIS — I10 Essential (primary) hypertension: Secondary | ICD-10-CM | POA: Diagnosis not present

## 2020-06-26 DIAGNOSIS — E782 Mixed hyperlipidemia: Secondary | ICD-10-CM | POA: Diagnosis not present

## 2020-06-26 DIAGNOSIS — F4321 Adjustment disorder with depressed mood: Secondary | ICD-10-CM

## 2020-06-26 DIAGNOSIS — E118 Type 2 diabetes mellitus with unspecified complications: Secondary | ICD-10-CM

## 2020-06-26 LAB — IRON AND TIBC
Iron: 110 ug/dL (ref 41–142)
Saturation Ratios: 43 % (ref 21–57)
TIBC: 256 ug/dL (ref 236–444)
UIBC: 147 ug/dL (ref 120–384)

## 2020-06-26 LAB — FERRITIN: Ferritin: 643 ng/mL — ABNORMAL HIGH (ref 11–307)

## 2020-06-26 MED ORDER — MUPIROCIN 2 % EX OINT: 1.0000 "application " | TOPICAL_OINTMENT | Freq: Two times a day (BID) | CUTANEOUS | 0 refills | Status: DC

## 2020-06-26 NOTE — Progress Notes (Signed)
Patient ID: [name], female    DOB: [birth year]  Age: 80 y.o. MRN: [id number]    Subjective:  Subjective  HPI [name] presents for office visit today for follow up on type 2 diabetes and osteopenia. She reports that she recently went to the ED due to experiencing symptoms that she suspected were cardiac symptoms. She states that in the ED, she was dx with acute bronchitis. She denies any chest pain, SOB, fever, abdominal pain, cough, chills, sore throat, dysuria, urinary incontinence, back pain, HA, or N/VD. She reports that she experienced eye burning and pain sensation. She states that she too eye drops for it, but reports that the eye drops did not help. She reports having nose sores and was prescribed an ointment to apply on her nose, which she states has helped get rid of the sores. She states that once she stopped the ointment, the sores have come back. She states that she experiences nosebleeds every day in the morning as a result. She denies having congestion or feeling a stuffy nose.   Review of Systems  Constitutional:  Negative for chills, fatigue and fever.  HENT:  Positive for nosebleeds (secondary to nose sores). Negative for congestion, rhinorrhea, sinus pressure, sinus pain and sore throat.        (+) nose sores (+) bilateral nostril pain  Eyes:  Positive for pain and redness.  Respiratory:  Negative for cough and shortness of breath.   Cardiovascular:  Negative for chest pain, palpitations and leg swelling.  Gastrointestinal:  Negative for abdominal pain, blood in stool, diarrhea, nausea and vomiting.  Genitourinary:  Negative for decreased urine volume, flank pain, frequency, vaginal bleeding and vaginal discharge.  Musculoskeletal:  Negative for back pain.  Neurological:  Negative for headaches.   History Past Medical History:  Diagnosis Date   Abnormal glucose tolerance test    Abnormal TSH 09/22/2016   ACE-inhibitor cough    Anemia 03/12/2014   Arthritis     Asthma    PFT 02/06/09 FEV1 1.41 (65%), FVC 1.92 (64), FEV1% 74, TLC 3.47 (71%), DLCO 48%, +BD   Atypical chest pain    s/p cath, Normal coronaries, Non ST elevation myocardial infarction, Rt groin pseudoaneurysm   Bacterial vaginosis 03/12/2014   Cellulitis 06/22/2016   Chronic kidney disease (CKD), stage III (moderate) (HCC) 08/04/2016   COPD (chronic obstructive pulmonary disease) (HCC)    Depression    Diastolic heart failure ([place]) 04/07/2016   DVT (deep venous thrombosis) ([place]) 1987   GERD (gastroesophageal reflux disease)    Gout    Hypercalcemia 10/15/2014   Hyperlipidemia    Hypertension    Hypoxia 10/15/2014   Macular degeneration 04/10/2015   Osteopenia 12/29/2006   Qualifier: Diagnosis of  By: Wynona Luna    Pneumonia    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   Her family history includes Arthritis in her brother, sister, and sister; Asthma in her sister; COPD in her brother and sister; Colon polyps in her sister; Coronary artery disease in her brother and another family member; Emphysema in her brother and sister; Epilepsy in her daughter; Heart attack in her brother and mother; Heart disease in her brother, mother, and sister; Hyperlipidemia in her mother, sister, sister, and sister; Hypertension in her daughter, sister, sister,  and sister; Lung cancer in her brother and brother; Mental illness in her father; Obesity in her daughter; Suicidality in her father; Uterine cancer in her sister.She reports that she has never smoked. She has never used smokeless tobacco. She reports that she does not drink alcohol and does not use drugs.  Current Outpatient Medications on File Prior to Visit  Medication Sig Dispense Refill   Accu-Chek Softclix Lancets lancets  CHECK BLOOD SUGAR ONE TIME WEEKLY 50 each 1   acetaminophen (TYLENOL) 500 MG tablet Take 500 mg by mouth 2 (two) times daily.      albuterol (ACCUNEB) 0.63 MG/3ML nebulizer solution Take 3 mLs (0.63 mg total) by nebulization every 6 (six) hours as needed for wheezing or shortness of breath. 150 mL 3   albuterol (PROVENTIL) (2.5 MG/3ML) 0.083% nebulizer solution      albuterol (VENTOLIN HFA) 108 (90 Base) MCG/ACT inhaler Inhale 2 puffs into the lungs every 4 (four) hours as needed for wheezing or shortness of breath. 54 g 3   Alcohol Swabs (ALCOHOL WIPES) 70 % PADS To use before checking blood sugars 100 each 1   allopurinol (ZYLOPRIM) 100 MG tablet TAKE 2 TABLETS ONE TIME DAILY 180 tablet 1   b complex vitamins tablet Take 1 tablet by mouth daily.     benzonatate (TESSALON) 100 MG capsule Take 2 capsules (200 mg total) by mouth every 8 (eight) hours. 21 capsule 0   Blood Glucose Calibration (ACCU-CHEK AVIVA) SOLN USE AS DIRECTED 1 each 0   blood glucose meter kit and supplies Check blood sugars once weekly. 1 each 0   Blood Glucose Monitoring Suppl (ACCU-CHEK AVIVA PLUS) w/Device KIT CHECK BLOOD SUGAR ONE TIME WEEKLY 1 kit 1   budesonide-formoterol (SYMBICORT) 160-4.5 MCG/ACT inhaler Inhale 2 puffs into the lungs in the morning and at bedtime. 6 g 0   dextromethorphan-guaiFENesin (MUCINEX DM) 30-600 MG 12hr tablet Take 1 tablet by mouth 2 (two) times daily. 20 tablet 0   docusate sodium (COLACE) 100 MG capsule Take 1 capsule (100 mg total) by mouth 2 (two) times daily. 10 capsule 0   doxycycline (VIBRAMYCIN) 100 MG capsule Take 1 capsule (100 mg total) by mouth 2 (two) times daily. 10 capsule 0   Ferrous Fumarate-Folic Acid (HEMOCYTE-F) 324-1 MG TABS Take 1 tablet by mouth daily. 90 each 1   fluticasone (FLONASE) 50 MCG/ACT nasal spray Place 2 sprays into both nostrils daily. 48 g 1   FLUZONE HIGH-DOSE QUADRIVALENT 0.7 ML SUSY      furosemide (LASIX) 40 MG tablet TAKE 1 TABLET DAILY AND A SECOND  TABLET DAILY AS NEEDED FOR INCREASED EDEMA OR WEIGHT GAIN GREATER THAN 3 POUNDS IN 24 HOURS 100 tablet 5   glucose blood (ACCU-CHEK AVIVA PLUS) test strip CHECK BLOOD SUGAR ONE TIME WEEKLY.  DX CODE: E11.9 50 strip 1   loratadine (CLARITIN) 10 MG tablet Take 1 tablet (10 mg total) by mouth at bedtime. (Patient taking differently: Take 10 mg by mouth at bedtime as needed for allergies.) 30 tablet 5   Magnesium Oxide 250 MG TABS Take 1 tablet (250 mg total) by mouth daily. 30 tablet 3   metoprolol succinate (TOPROL-XL) 50 MG 24 hr tablet Take 1.5 tablets (75 mg total) by mouth daily. Take with or immediately following a meal. 135 tablet 3   montelukast (SINGULAIR) 10 MG tablet Take 1 tablet (10 mg total) by mouth at bedtime. 90 tablet 2   OXYGEN Inhale 2 L into the lungs continuous.        pantoprazole (PROTONIX) 40 MG tablet Take 40 mg by mouth 2 (two) times daily.     polyethylene glycol (MIRALAX / GLYCOLAX) 17 g packet Take 17 g by mouth daily. 14 each 0   pravastatin (PRAVACHOL) 40 MG tablet Take 1 tablet (40 mg total) by mouth daily. 90 tablet 1   Probiotic Product (PROBIOTIC PO) Take 1 tablet by mouth daily.      sertraline (ZOLOFT) 100 MG tablet Take 1 tablet (100 mg total) by mouth daily. 90 tablet 1   traMADol (ULTRAM) 50 MG tablet TAKE 1 TABLET BY MOUTH THREE TIMES DAILY AS NEEDED FOR MODERATE PAIN 90 tablet 0   trolamine salicylate (ASPERCREME) 10 % cream Apply 1 application topically as needed for muscle pain.     vitamin B-12 (CYANOCOBALAMIN) 1000 MCG tablet Take 1,000 mcg by mouth daily.      No current facility-administered medications on file prior to visit.     Objective:  Objective  Physical Exam Constitutional:      General: She is not in acute distress.    Appearance: Normal appearance. She is not ill-appearing or toxic-appearing.  HENT:     Head: Normocephalic and atraumatic.     Right Ear: Tympanic membrane, ear canal and external ear normal.     Left Ear: Tympanic  membrane, ear canal and external ear normal.     Nose: No congestion or rhinorrhea.  Eyes:     Extraocular Movements: Extraocular movements intact.     Pupils: Pupils are equal, round, and reactive to light.  Cardiovascular:     Rate and Rhythm: Normal rate and regular rhythm.     Heart sounds: Normal heart sounds. No murmur heard.    Comments: -edema in LE's hard and fixed Pulmonary:     Effort: Pulmonary effort is normal. No respiratory distress.     Breath sounds: Normal breath sounds. No wheezing, rhonchi or rales.  Abdominal:     General: Bowel sounds are normal.     Palpations: Abdomen is soft. There is no mass.     Tenderness: no abdominal tenderness There is no guarding.     Hernia: No hernia is present.  Musculoskeletal:        General: Normal range of motion.     Cervical back: Normal range of motion and neck supple.     Right lower leg: 1+ Edema present.     Left lower leg: 1+ Edema present.  Skin:    General: Skin is warm and dry.  Neurological:     Mental Status: She is alert and oriented to person, place, and time.  Psychiatric:        Behavior: Behavior normal.   BP 114/60   Pulse 78   Temp 98.1 F (36.7 C)   Resp 16   Wt 182 lb 9.6 oz (82.8 kg)   SpO2 90%   BMI 31.34 kg/m  Wt Readings from Last 3 Encounters:  06/26/20 182 lb 9.6 oz (82.8 kg)  06/25/20 181 lb (82.1 kg)  06/04/20 185 lb 6.4 oz (84.1 kg)     Lab Results  Component Value Date   WBC 6.8 06/25/2020   HGB 11.1 (L) 06/25/2020   HCT 35.1 (L) 06/25/2020   PLT 127 (L) 06/25/2020   GLUCOSE 104 (H) 06/25/2020   CHOL 124 02/23/2020   TRIG 127.0 02/23/2020   HDL 48.20 02/23/2020   LDLDIRECT 73.0 06/16/2017   LDLCALC 50 02/23/2020   ALT 7 06/25/2020   AST 12 (L)   06/25/2020   NA 139 06/25/2020   K 4.3 06/25/2020   CL 96 (L) 06/25/2020   CREATININE 2.01 (H) 06/25/2020   BUN 44 (H) 06/25/2020   CO2 35 (H) 06/25/2020   TSH 4.79 (H) 07/06/2019   INR 1.0 09/12/2006   HGBA1C 5.5 02/23/2020    MICROALBUR <0.7 02/23/2020    DG Chest Portable 1 View  Result Date: 04/08/2020 CLINICAL DATA:  Cough, congestion EXAM: PORTABLE CHEST 1 VIEW COMPARISON:  05/30/2019 FINDINGS: There is hyperinflation of the lungs compatible with COPD. Cardiomegaly. No confluent airspace opacities or effusions. Stable mild chronic elevation of the left hemidiaphragm. No acute bony abnormality. IMPRESSION: Cardiomegaly, COPD.  Chronic changes.  No active disease. Electronically Signed   By: Kevin  Dover M.D.   On: 04/08/2020 15:39     Assessment & Plan:  Plan    Meds ordered this encounter  Medications   mupirocin ointment (BACTROBAN) 2 %    Sig: Apply 1 application topically 2 (two) times daily.    Dispense:  30 g    Refill:  0    Problem List Items Addressed This Visit     Essential hypertension    Well controlled, no changes to meds. Encouraged heart healthy diet such as the DASH diet and exercise as tolerated.        Diabetes type 2, controlled (HCC)    hgba1c acceptable, minimize simple carbs. Increase exercise as tolerated. Continue current meds       Hyperlipidemia, mixed    Encourage heart healthy diet such as MIND or DASH diet, increase exercise, avoid trans fats, simple carbohydrates and processed foods, consider a krill or fish or flaxseed oil cap daily.        Vitamin D deficiency    Supplement and monitor       Staph infection    In nose. Given a refill on Mupirocin ointment to apply to each nares. Report if no resolution       Relevant Medications   mupirocin ointment (BACTROBAN) 2 %   Grief    Her sister in law just died in [place], she is sad but feels she is managing        Follow-up: Return in about 3 months (around 10/08/2020).   I,David Hanna,acting as a scribe for Stacey Blyth, MD.,have documented all relevant documentation on the behalf of Stacey Blyth, MD,as directed by  Stacey Blyth, MD while in the presence of Stacey Blyth, MD.  I, Stacey A Blyth,  MD personally performed the services described in this documentation. All medical record entries made by the scribe were at my direction and in my presence. I have reviewed the chart and agree that the record reflects my personal performance and is accurate and complete   

## 2020-06-26 NOTE — Patient Instructions (Signed)
Shingrix is the new shingles shot, 2 shots over 2-6 months, confirm coverage with insurance and document, then can return here for shots with nurse appt or at pharmacy.you need the second shot  Get the 4th COVID booster

## 2020-06-27 ENCOUNTER — Telehealth: Payer: Self-pay | Admitting: *Deleted

## 2020-06-27 NOTE — Telephone Encounter (Signed)
Per 06/25/20 los - gave upcoming appointments - view mychart

## 2020-06-30 DIAGNOSIS — F4321 Adjustment disorder with depressed mood: Secondary | ICD-10-CM | POA: Insufficient documentation

## 2020-06-30 DIAGNOSIS — B958 Unspecified staphylococcus as the cause of diseases classified elsewhere: Secondary | ICD-10-CM | POA: Insufficient documentation

## 2020-06-30 NOTE — Assessment & Plan Note (Signed)
Supplement and monitor 

## 2020-06-30 NOTE — Assessment & Plan Note (Signed)
Her sister in law just died in [place], she is sad but feels she is managing

## 2020-06-30 NOTE — Assessment & Plan Note (Signed)
Encourage heart healthy diet such as MIND or DASH diet, increase exercise, avoid trans fats, simple carbohydrates and processed foods, consider a krill or fish or flaxseed oil cap daily.  °

## 2020-06-30 NOTE — Assessment & Plan Note (Signed)
Well controlled, no changes to meds. Encouraged heart healthy diet such as the DASH diet and exercise as tolerated.  °

## 2020-06-30 NOTE — Assessment & Plan Note (Signed)
In nose. Given a refill on Mupirocin ointment to apply to each nares. Report if no resolution

## 2020-06-30 NOTE — Assessment & Plan Note (Signed)
hgba1c acceptable, minimize simple carbs. Increase exercise as tolerated. Continue current meds 

## 2020-07-14 DIAGNOSIS — R0902 Hypoxemia: Secondary | ICD-10-CM | POA: Diagnosis not present

## 2020-07-14 DIAGNOSIS — J4520 Mild intermittent asthma, uncomplicated: Secondary | ICD-10-CM | POA: Diagnosis not present

## 2020-07-16 ENCOUNTER — Inpatient Hospital Stay: Payer: Medicare HMO | Attending: Family Medicine

## 2020-07-16 ENCOUNTER — Inpatient Hospital Stay: Payer: Medicare HMO

## 2020-07-16 ENCOUNTER — Other Ambulatory Visit: Payer: Self-pay

## 2020-07-16 VITALS — BP 123/52 | HR 62 | Temp 98.1°F | Resp 17

## 2020-07-16 DIAGNOSIS — N183 Chronic kidney disease, stage 3 unspecified: Secondary | ICD-10-CM | POA: Diagnosis not present

## 2020-07-16 DIAGNOSIS — D509 Iron deficiency anemia, unspecified: Secondary | ICD-10-CM | POA: Insufficient documentation

## 2020-07-16 DIAGNOSIS — E611 Iron deficiency: Secondary | ICD-10-CM | POA: Diagnosis not present

## 2020-07-16 DIAGNOSIS — D631 Anemia in chronic kidney disease: Secondary | ICD-10-CM

## 2020-07-16 DIAGNOSIS — Z79899 Other long term (current) drug therapy: Secondary | ICD-10-CM | POA: Insufficient documentation

## 2020-07-16 LAB — CBC WITH DIFFERENTIAL (CANCER CENTER ONLY)
Abs Immature Granulocytes: 0.03 10*3/uL (ref 0.00–0.07)
Basophils Absolute: 0 10*3/uL (ref 0.0–0.1)
Basophils Relative: 1 %
Eosinophils Absolute: 0.3 10*3/uL (ref 0.0–0.5)
Eosinophils Relative: 5 %
HCT: 33 % — ABNORMAL LOW (ref 36.0–46.0)
Hemoglobin: 10.2 g/dL — ABNORMAL LOW (ref 12.0–15.0)
Immature Granulocytes: 1 %
Lymphocytes Relative: 19 %
Lymphs Abs: 1.2 10*3/uL (ref 0.7–4.0)
MCH: 31.2 pg (ref 26.0–34.0)
MCHC: 30.9 g/dL (ref 30.0–36.0)
MCV: 100.9 fL — ABNORMAL HIGH (ref 80.0–100.0)
Monocytes Absolute: 0.7 10*3/uL (ref 0.1–1.0)
Monocytes Relative: 11 %
Neutro Abs: 4 10*3/uL (ref 1.7–7.7)
Neutrophils Relative %: 63 %
Platelet Count: 98 10*3/uL — ABNORMAL LOW (ref 150–400)
RBC: 3.27 MIL/uL — ABNORMAL LOW (ref 3.87–5.11)
RDW: 13.9 % (ref 11.5–15.5)
WBC Count: 6.3 10*3/uL (ref 4.0–10.5)
nRBC: 0 % (ref 0.0–0.2)

## 2020-07-16 LAB — CMP (CANCER CENTER ONLY)
ALT: 10 U/L (ref 0–44)
AST: 13 U/L — ABNORMAL LOW (ref 15–41)
Albumin: 4.3 g/dL (ref 3.5–5.0)
Alkaline Phosphatase: 45 U/L (ref 38–126)
Anion gap: 7 (ref 5–15)
BUN: 48 mg/dL — ABNORMAL HIGH (ref 8–23)
CO2: 35 mmol/L — ABNORMAL HIGH (ref 22–32)
Calcium: 10.2 mg/dL (ref 8.9–10.3)
Chloride: 99 mmol/L (ref 98–111)
Creatinine: 1.68 mg/dL — ABNORMAL HIGH (ref 0.44–1.00)
GFR, Estimated: 31 mL/min — ABNORMAL LOW (ref >60)
Glucose, Bld: 82 mg/dL (ref 70–99)
Potassium: 4.6 mmol/L (ref 3.5–5.1)
Sodium: 141 mmol/L (ref 135–145)
Total Bilirubin: 0.5 mg/dL (ref 0.3–1.2)
Total Protein: 6.8 g/dL (ref 6.5–8.1)

## 2020-07-16 MED ORDER — EPOETIN ALFA-EPBX 40000 UNIT/ML IJ SOLN
40000.0000 [IU] | Freq: Once | INTRAMUSCULAR | Status: AC
  Administered 2020-07-16: 40000 [IU] via SUBCUTANEOUS

## 2020-07-16 MED ORDER — EPOETIN ALFA-EPBX 40000 UNIT/ML IJ SOLN
INTRAMUSCULAR | Status: AC
  Filled 2020-07-16: qty 1

## 2020-07-19 ENCOUNTER — Telehealth: Payer: Self-pay | Admitting: Family Medicine

## 2020-07-19 NOTE — Telephone Encounter (Signed)
Patient called back , and I got her scheduled for 07/25/2020

## 2020-07-19 NOTE — Telephone Encounter (Signed)
Copied from [place] [phone]. Topic: Medicare AWV >> Jul 19, 2020 10:21 AM Harris-Coley, Hannah Beat wrote: Reason for CRM: Left message for patient to schedule Annual Wellness Visit.  Please schedule with Health Nurse Advisor Augustine Radar. at [facility].

## 2020-07-20 ENCOUNTER — Emergency Department (HOSPITAL_BASED_OUTPATIENT_CLINIC_OR_DEPARTMENT_OTHER)
Admission: EM | Admit: 2020-07-20 | Discharge: 2020-07-20 | Disposition: A | Discharge status: Home / Self Care | Payer: Medicare HMO | Attending: Emergency Medicine | Admitting: Emergency Medicine

## 2020-07-20 ENCOUNTER — Telehealth: Payer: Self-pay

## 2020-07-20 ENCOUNTER — Other Ambulatory Visit: Payer: Self-pay

## 2020-07-20 ENCOUNTER — Encounter (HOSPITAL_BASED_OUTPATIENT_CLINIC_OR_DEPARTMENT_OTHER): Payer: Self-pay | Admitting: *Deleted

## 2020-07-20 DIAGNOSIS — Z79899 Other long term (current) drug therapy: Secondary | ICD-10-CM | POA: Insufficient documentation

## 2020-07-20 DIAGNOSIS — I130 Hypertensive heart and chronic kidney disease with heart failure and stage 1 through stage 4 chronic kidney disease, or unspecified chronic kidney disease: Secondary | ICD-10-CM | POA: Insufficient documentation

## 2020-07-20 DIAGNOSIS — N184 Chronic kidney disease, stage 4 (severe): Secondary | ICD-10-CM | POA: Diagnosis not present

## 2020-07-20 DIAGNOSIS — J4540 Moderate persistent asthma, uncomplicated: Secondary | ICD-10-CM | POA: Insufficient documentation

## 2020-07-20 DIAGNOSIS — I5042 Chronic combined systolic (congestive) and diastolic (congestive) heart failure: Secondary | ICD-10-CM | POA: Insufficient documentation

## 2020-07-20 DIAGNOSIS — S81812A Laceration without foreign body, left lower leg, initial encounter: Secondary | ICD-10-CM | POA: Insufficient documentation

## 2020-07-20 DIAGNOSIS — J449 Chronic obstructive pulmonary disease, unspecified: Secondary | ICD-10-CM | POA: Diagnosis not present

## 2020-07-20 DIAGNOSIS — Z7952 Long term (current) use of systemic steroids: Secondary | ICD-10-CM | POA: Diagnosis not present

## 2020-07-20 DIAGNOSIS — W19XXXA Unspecified fall, initial encounter: Secondary | ICD-10-CM

## 2020-07-20 DIAGNOSIS — W01198A Fall on same level from slipping, tripping and stumbling with subsequent striking against other object, initial encounter: Secondary | ICD-10-CM | POA: Insufficient documentation

## 2020-07-20 DIAGNOSIS — S59912A Unspecified injury of left forearm, initial encounter: Secondary | ICD-10-CM | POA: Diagnosis present

## 2020-07-20 DIAGNOSIS — S51812A Laceration without foreign body of left forearm, initial encounter: Secondary | ICD-10-CM

## 2020-07-20 DIAGNOSIS — E1122 Type 2 diabetes mellitus with diabetic chronic kidney disease: Secondary | ICD-10-CM | POA: Insufficient documentation

## 2020-07-20 MED ORDER — BACITRACIN ZINC 500 UNIT/GM EX OINT: 1.0000 "application " | TOPICAL_OINTMENT | Freq: Two times a day (BID) | CUTANEOUS | 0 refills | Status: DC

## 2020-07-20 NOTE — ED Notes (Signed)
Patients concentrator battery dead, placed on 2lpm [place] from O2 cylinder.

## 2020-07-20 NOTE — Telephone Encounter (Signed)
FYI:   Pt called stating she fell last night. She said she broke skin on her leg and had some "oozing" overnight. Denies any LOC. Since there are no openings today in any  office I advised the pt to be seen in an UC to have her laceration evaluated.

## 2020-07-20 NOTE — ED Provider Notes (Signed)
[facility] EMERGENCY DEPARTMENT Provider Note   CSN: [id number] Arrival date & time: 07/20/20  1104     History Chief Complaint  Patient presents with   [name] is a 80 y.o. female.  HPI Patient reports that she was going out on her deck to feed the birds.  She was trying to carry some things and ended up tripping on her oxygen tubing.  She reports she scraped her left lower leg and her left arm.  She denies any other significant history.  She reports has been able to ambulate.  She does not have hip or knee pain.  She did not strike her head.  She denies any neck pain.  She reports she came because she was concerned that the scrape on her lower leg might become infected.    Past Medical History:  Diagnosis Date   Abnormal glucose tolerance test    Abnormal TSH 09/22/2016   ACE-inhibitor cough    Anemia 03/12/2014   Arthritis    Asthma    PFT 02/06/09 FEV1 1.41 (65%), FVC 1.92 (64), FEV1% 74, TLC 3.47 (71%), DLCO 48%, +BD   Atypical chest pain    s/p cath, Normal coronaries, Non ST elevation myocardial infarction, Rt groin pseudoaneurysm   Bacterial vaginosis 03/12/2014   Cellulitis 06/22/2016   Chronic kidney disease (CKD), stage III (moderate) (HCC) 08/04/2016   COPD (chronic obstructive pulmonary disease) (HCC)    Depression    Diastolic heart failure ([place]) 04/07/2016   DVT (deep venous thrombosis) ([place]) 1987   GERD (gastroesophageal reflux disease)    Gout    Hypercalcemia 10/15/2014   Hyperlipidemia    Hypertension    Hypoxia 10/15/2014   Macular degeneration 04/10/2015   Osteopenia 12/29/2006   Qualifier: Diagnosis of  By: Wynona Luna    Pneumonia    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   Renal insufficiency 09/22/2016   Unspecified constipation 06/05/2013   Vitamin D deficiency 01/01/2015    Patient Active Problem List   Diagnosis Date Noted   Staph infection 06/30/2020   Grief 06/30/2020   IDA (iron deficiency anemia) 05/09/2020    Physical deconditioning 02/26/2020   Rib fracture 04/07/2019   Chronic asthma, mild persistent, uncomplicated 44/96/7591   High serum vitamin B12 11/20/2017   Subarachnoid hemorrhage ([place]) 11/05/2017   Intracranial hemorrhage ([place]) 11/04/2017   Syncope and collapse 11/04/2017   Thrombocytopenia ([place]) 10/16/2017   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   Vitamin D deficiency 06/16/2017   Hypernatremia 12/25/2016   Nonintractable headache 10/26/2016   Leukocytes in urine 10/26/2016   Peripheral arterial disease ([place]) 10/01/2016   Abnormal TSH 09/22/2016   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[place]) 08/04/2016   Heart murmur 04/25/2016   Chronic combined systolic and diastolic CHF (congestive heart failure) ([place]) 04/07/2016   Bronchitis 03/06/2016   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   Pedal edema 08/28/2015   Chronic respiratory failure with hypoxia and hypercapnia ([place]) 06/28/2015   Macular degeneration 04/10/2015   Fall at home, initial encounter 12/04/2014   Hypercalcemia 10/15/2014   Hypoxia 10/15/2014   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[place]) 03/12/2014   Cervical cancer screening 02/28/2014   Eustachian tube dysfunction 01/29/2014   Medicare annual wellness visit, subsequent 12/04/2013   Constipation 06/05/2013   Shortness of breath 03/07/2013   Tachycardia 02/23/2013   Sun-damaged skin 10/24/2012   Lower urinary tract infectious disease 10/24/2012   Back pain 04/07/2011   Allergic rhinitis  05/23/2009   Gout 01/11/2008   VARICOSE VEINS, LOWER EXTREMITIES 06/01/2007   ADJ DISORDER WITH MIXED ANXIETY & DEPRESSED MOOD 03/25/2007   Hyperlipidemia, mixed 12/29/2006   Essential hypertension 12/29/2006   Osteopenia 12/29/2006   Abdominal pain 12/29/2006   Diabetes type 2, controlled ([place]) 12/29/2006   Asthma, moderate persistent 08/21/2006   GERD 08/21/2006   Osteoarthritis 08/21/2006   Phlebitis and thrombophlebitis 08/21/2006    Past Surgical  History:  Procedure Laterality Date   [place]     OB History   No obstetric history on file.     Family History  Problem Relation Age of Onset   Asthma Sister    Hypertension Sister    Hyperlipidemia Sister    Uterine cancer Sister    Coronary artery disease Brother        x2   Arthritis Brother    Lung cancer Brother        smoker   Hypertension Sister    Arthritis Sister    Hyperlipidemia Sister    Emphysema Sister    COPD Sister        smoker   Heart disease Sister    Hyperlipidemia Sister    Hypertension Sister    Arthritis Sister    Emphysema Brother    Heart disease Brother         smoker   Heart attack Brother    Mental illness Father    Suicidality Father    Heart disease Mother    Hyperlipidemia Mother    Heart attack Mother    Epilepsy Daughter    Hypertension Daughter    Obesity Daughter    COPD Brother        smoker   Lung cancer Brother    Coronary artery disease Other    Colon polyps Sister     Social History   Tobacco Use   Smoking status: Never   Smokeless tobacco: Never  Vaping Use   Vaping Use: Never used  Substance Use Topics   Alcohol use: No   Drug use: No    Home Medications Prior to Admission medications   Medication Sig Start Date End Date Taking? Authorizing Provider  Accu-Chek Softclix Lancets lancets CHECK BLOOD SUGAR ONE TIME WEEKLY 04/11/20   Mosie Lukes, MD  acetaminophen (TYLENOL) 500 MG tablet Take 500 mg by mouth 2 (two) times daily.     [provider]  albuterol (ACCUNEB) 0.63 MG/3ML nebulizer solution Take 3 mLs (0.63 mg total) by nebulization every 6 (six) hours as needed for wheezing or shortness of breath. 09/12/19   Mosie Lukes, MD  albuterol (PROVENTIL) (2.5 MG/3ML) 0.083% nebulizer solution  09/13/19   [provider]  albuterol (VENTOLIN HFA) 108 (90 Base) MCG/ACT inhaler Inhale 2 puffs into the lungs every 4 (four) hours as  needed for wheezing or shortness of breath. 08/24/19   Mosie Lukes, MD  Alcohol Swabs (ALCOHOL WIPES) 70 % PADS To use before checking blood sugars 04/11/20   Mosie Lukes, MD  allopurinol (ZYLOPRIM) 100 MG tablet TAKE 2 TABLETS ONE TIME DAILY 04/18/20   Mosie Lukes, MD  b complex vitamins tablet Take 1 tablet by mouth daily.    [provider]  benzonatate (TESSALON) 100 MG capsule Take 2 capsules (200 mg total) by mouth every 8 (eight) hours. 04/02/20   Wieters, Hallie C, PA-C  Blood Glucose Calibration (ACCU-CHEK  AVIVA) SOLN USE AS DIRECTED 03/14/20   Mosie Lukes, MD  blood glucose meter kit and supplies Check blood sugars once weekly. 06/30/18   Mosie Lukes, MD  Blood Glucose Monitoring Suppl (ACCU-CHEK AVIVA PLUS) w/Device KIT CHECK BLOOD SUGAR ONE TIME WEEKLY 04/11/20   Mosie Lukes, MD  budesonide-formoterol [facility]) 160-4.5 MCG/ACT inhaler Inhale 2 puffs into the lungs in the morning and at bedtime. 05/16/20   Rigoberto Noel, MD  dextromethorphan-guaiFENesin [facility] DM) 30-600 MG 12hr tablet Take 1 tablet by mouth 2 (two) times daily. 04/02/20   Wieters, Hallie C, PA-C  docusate sodium (COLACE) 100 MG capsule Take 1 capsule (100 mg total) by mouth 2 (two) times daily. 04/14/19   Donne Hazel, MD  doxycycline (VIBRAMYCIN) 100 MG capsule Take 1 capsule (100 mg total) by mouth 2 (two) times daily. 04/08/20   Quintella Reichert, MD  Ferrous Fumarate-Folic Acid (HEMOCYTE-F) 324-1 MG TABS Take 1 tablet by mouth daily. 07/24/16   Mosie Lukes, MD  fluticasone (FLONASE) 50 MCG/ACT nasal spray Place 2 sprays into both nostrils daily. 08/25/19   Mosie Lukes, MD  FLUZONE HIGH-DOSE QUADRIVALENT 0.7 ML SUSY  12/27/19   [provider]  furosemide (LASIX) 40 MG tablet TAKE 1 TABLET DAILY AND A SECOND TABLET DAILY AS NEEDED FOR INCREASED EDEMA OR WEIGHT GAIN GREATER THAN 3 POUNDS IN 24 HOURS 12/19/19   Mosie Lukes, MD  glucose blood (ACCU-CHEK AVIVA PLUS) test strip  CHECK BLOOD SUGAR ONE TIME WEEKLY.  DX CODE: E11.9 04/11/20   Mosie Lukes, MD  loratadine (CLARITIN) 10 MG tablet Take 1 tablet (10 mg total) by mouth at bedtime. Patient taking differently: Take 10 mg by mouth at bedtime as needed for allergies. 03/06/16   Magdalen Spatz, NP  Magnesium Oxide 250 MG TABS Take 1 tablet (250 mg total) by mouth daily. 02/02/20   Mosie Lukes, MD  metoprolol succinate (TOPROL-XL) 50 MG 24 hr tablet Take 1.5 tablets (75 mg total) by mouth daily. Take with or immediately following a meal. 07/06/19   Mosie Lukes, MD  montelukast (SINGULAIR) 10 MG tablet Take 1 tablet (10 mg total) by mouth at bedtime. 07/01/19   Tanda Rockers, MD  mupirocin ointment (BACTROBAN) 2 % Apply 1 application topically 2 (two) times daily. 06/26/20   Mosie Lukes, MD  OXYGEN Inhale 2 L into the lungs continuous.     [provider]  pantoprazole (PROTONIX) 40 MG tablet Take 40 mg by mouth 2 (two) times daily. 05/18/18   [provider]  polyethylene glycol (MIRALAX / GLYCOLAX) 17 g packet Take 17 g by mouth daily. 04/15/19   Donne Hazel, MD  pravastatin (PRAVACHOL) 40 MG tablet Take 1 tablet (40 mg total) by mouth daily. 03/13/20   Mosie Lukes, MD  Probiotic Product (PROBIOTIC PO) Take 1 tablet by mouth daily.     [provider]  sertraline (ZOLOFT) 100 MG tablet Take 1 tablet (100 mg total) by mouth daily. 08/24/19   Mosie Lukes, MD  traMADol (ULTRAM) 50 MG tablet TAKE 1 TABLET BY MOUTH THREE TIMES DAILY AS NEEDED FOR MODERATE PAIN 05/30/20   Copland, Gay Filler, MD  trolamine salicylate (ASPERCREME) 10 % cream Apply 1 application topically as needed for muscle pain.    [provider]  vitamin B-12 (CYANOCOBALAMIN) 1000 MCG tablet Take 1,000 mcg by mouth daily.     [provider]    Allergies  Montelukast sodium, Sulfa antibiotics, Sulfonamide derivatives, Oseltamivir phosphate, Ace inhibitors, and Indomethacin  Review of Systems    Review of Systems Constitutional: No fever no chills no malaise Neurologic: No headache no confusion no focal weakness Physical Exam Updated Vital Signs BP 123/63 (BP Location: Right Arm)   Pulse 72   Temp 98.7 F (37.1 C) (Oral)   Resp (!) 22   Ht _0  (1.626 m)   Wt 83.9 kg   SpO2 99%   BMI 31.76 kg/m   Physical Exam Constitutional:      Comments: Alert nontoxic no respiratory distress.  Patient is chronically on O2.  HENT:     Head: Normocephalic and atraumatic.  Eyes:     Extraocular Movements: Extraocular movements intact.  Cardiovascular:     Rate and Rhythm: Normal rate and regular rhythm.  Pulmonary:     Effort: Pulmonary effort is normal.     Breath sounds: Normal breath sounds.     Comments: Sounds are clear.  Patient does not have wheeze or crackle. Musculoskeletal:     Comments: 2+ edema bilateral lower extremities.  Skin thinning consistent with chronic venous stasis.  See attached image for skin tear  Skin:    General: Skin is warm and dry.     Comments: See attached images for skin tears  Neurological:     General: No focal deficit present.     Mental Status: She is oriented to person, place, and time.  Psychiatric:        Mood and Affect: Mood normal.       ED Results / Procedures / Treatments   Labs (all labs ordered are listed, but only abnormal results are displayed) Labs Reviewed - No data to display  EKG None  Radiology No results found.  Procedures Procedures   Medications Ordered in ED Medications - No data to display  ED Course  I have reviewed the triage vital signs and the nursing notes.  Pertinent labs & imaging results that were available during my care of the patient were reviewed by me and considered in my medical decision making (see chart for details).    MDM Rules/Calculators/A&P                          Uncomplicated skin tears.  We will apply Xeroform dressings.  Recommend follow-up with PCP or wound care for  dressing changes.  No signs of other injuries.  Patient has been able to weight-bear and ambulate without pain in her hips or her knees.  No head injury.  Patient is not anticoagulated. Final Clinical Impression(s) / ED Diagnoses Final diagnoses:  Fall, initial encounter  Skin tear of forearm without complication, left, initial encounter  Skin tear of left lower leg without complication, initial encounter    Rx / DC Orders ED Discharge Orders     None        Charlesetta Shanks, MD 07/20/20 1435

## 2020-07-20 NOTE — ED Notes (Signed)
Xeroform applied to arm and leg, wrapped with non stick dressing with cloth tape securing to wounds.

## 2020-07-20 NOTE — ED Triage Notes (Signed)
Fell last pm, tripped over O2 tubing  c/o left leg pain w skin tears and left arm pain w skin tear

## 2020-07-20 NOTE — Discharge Instructions (Addendum)
1.  You may use leave your dressings in place on your leg for the next 3 to 5 days.  Try to schedule an appointment with your doctor in the next 3 to 5 days for recheck and redressing.  You have also been given a referral to the wound care center that can help you with dressing changes.  2.  At this time if you change dressings at home, apply bacitracin ointment as prescribed and cover with a clean nonstick gauze.

## 2020-07-20 NOTE — ED Notes (Signed)
Nursing communication order rec, NT/EMT to bedside to apply dsgs per ED MD orders

## 2020-07-25 ENCOUNTER — Other Ambulatory Visit (HOSPITAL_BASED_OUTPATIENT_CLINIC_OR_DEPARTMENT_OTHER): Payer: Self-pay

## 2020-07-25 ENCOUNTER — Encounter: Payer: Self-pay | Admitting: Family

## 2020-07-25 ENCOUNTER — Ambulatory Visit (INDEPENDENT_AMBULATORY_CARE_PROVIDER_SITE_OTHER): Payer: Medicare HMO | Admitting: Family

## 2020-07-25 ENCOUNTER — Other Ambulatory Visit: Payer: Self-pay

## 2020-07-25 ENCOUNTER — Ambulatory Visit (INDEPENDENT_AMBULATORY_CARE_PROVIDER_SITE_OTHER): Payer: Medicare HMO

## 2020-07-25 VITALS — BP 135/86 | HR 79 | Temp 97.9°F | Resp 16 | Wt 191.0 lb

## 2020-07-25 VITALS — BP 130/78 | HR 73 | Temp 98.5°F | Resp 18 | Ht 64.0 in | Wt 191.4 lb

## 2020-07-25 DIAGNOSIS — Z0000 Encounter for general adult medical examination without abnormal findings: Secondary | ICD-10-CM | POA: Diagnosis not present

## 2020-07-25 DIAGNOSIS — L03119 Cellulitis of unspecified part of limb: Secondary | ICD-10-CM | POA: Diagnosis not present

## 2020-07-25 DIAGNOSIS — Z23 Encounter for immunization: Secondary | ICD-10-CM | POA: Diagnosis not present

## 2020-07-25 DIAGNOSIS — S81812A Laceration without foreign body, left lower leg, initial encounter: Secondary | ICD-10-CM

## 2020-07-25 DIAGNOSIS — Z1231 Encounter for screening mammogram for malignant neoplasm of breast: Secondary | ICD-10-CM

## 2020-07-25 DIAGNOSIS — R609 Edema, unspecified: Secondary | ICD-10-CM | POA: Diagnosis not present

## 2020-07-25 DIAGNOSIS — Z780 Asymptomatic menopausal state: Secondary | ICD-10-CM | POA: Diagnosis not present

## 2020-07-25 DIAGNOSIS — S61412A Laceration without foreign body of left hand, initial encounter: Secondary | ICD-10-CM

## 2020-07-25 MED ORDER — CEPHALEXIN 500 MG PO CAPS
500.0000 mg | ORAL_CAPSULE | Freq: Three times a day (TID) | ORAL | 0 refills | Status: DC
  Filled 2020-07-25: qty 21, 7d supply, fill #0

## 2020-07-25 NOTE — Progress Notes (Signed)
Subjective:     Patient ID: [name], female    DOB: 08-14-1940, 80 y.o.   MRN: [id number]  Chief Complaint  Patient presents with   Leg Injury    Patient has a leg injury after falling at home 6-30. Was seen at ed 07-20-20.     HPI Patient is in today for skin tear left shin. States that she fell on 6/30, she got tripped on a cord and fell even though she had her walker.  She has some discomfort. Also notes increased LE edema. She did have some sob the other day.  Taking lasix 54m once daily.  Wt Readings from Last 3 Encounters:  07/25/20 191 lb (86.6 kg)  07/25/20 191 lb 6.4 oz (86.8 kg)  07/20/20 185 lb (83.9 kg)    Health Maintenance Due  Topic Date Due   Zoster Vaccines- Shingrix (1 of 2) Never done   COLONOSCOPY (Pts 45-473yrInsurance coverage will need to be confirmed)  11/10/2018   OPHTHALMOLOGY EXAM  07/28/2019   COVID-19 Vaccine (4 - Booster for Pfizer series) 04/03/2020    Past Medical History:  Diagnosis Date   Abnormal glucose tolerance test    Abnormal TSH 09/22/2016   ACE-inhibitor cough    Anemia 03/12/2014   Arthritis    Asthma    PFT 02/06/09 FEV1 1.41 (65%), FVC 1.92 (64), FEV1% 74, TLC 3.47 (71%), DLCO 48%, +BD   Atypical chest pain    s/p cath, Normal coronaries, Non ST elevation myocardial infarction, Rt groin pseudoaneurysm   Bacterial vaginosis 03/12/2014   Cellulitis 06/22/2016   Chronic kidney disease (CKD), stage III (moderate) (H[place]7/16/2018   COPD (chronic obstructive pulmonary disease) (HCC)    Depression    Diastolic heart failure ([place]CG[place]3/19/2018   DVT (deep venous thrombosis) ([place]   GERD (gastroesophageal reflux disease)    Gout    Hypercalcemia 10/15/2014   Hyperlipidemia    Hypertension    Hypoxia 10/15/2014   Macular degeneration 04/10/2015   Osteopenia 12/29/2006   Qualifier: Diagnosis of  By: YoShawna OrleansO, D.Sandy Salaam  Pneumonia    Polymyalgia rheumatica (HCBertram12/18/2017   Renal insufficiency 09/22/2016   Unspecified  constipation 06/05/2013   Vitamin D deficiency 01/01/2015    Past Surgical History:  Procedure Laterality Date   [place]  Family History  Problem Relation Age of Onset   Asthma Sister    Hypertension Sister    Hyperlipidemia Sister    Uterine cancer Sister    Coronary artery disease Brother        x2   Arthritis Brother    Lung cancer Brother        smoker   Hypertension Sister    Arthritis Sister    Hyperlipidemia Sister    Emphysema Sister    COPD Sister        smoker   Heart disease Sister    Hyperlipidemia Sister    Hypertension Sister    Arthritis Sister    Emphysema Brother    Heart disease Brother         smoker   Heart attack Brother    Mental illness Father    Suicidality Father    Heart disease Mother    Hyperlipidemia Mother    Heart attack Mother    Epilepsy Daughter    Hypertension Daughter    Obesity Daughter  COPD Brother        smoker   Lung cancer Brother    Coronary artery disease Other    Colon polyps Sister     Social History   Socioeconomic History   Marital status: Widowed    Spouse name: Not on file   Number of children: 1   Years of education: Not on file   Highest education level: Not on file  Occupational History   Occupation: Retired    Fish farm manager: [employer]  Tobacco Use   Smoking status: Never   Smokeless tobacco: Never  Vaping Use   Vaping Use: Never used  Substance and Sexual Activity   Alcohol use: No   Drug use: No   Sexual activity: Never    Comment: lives alone, no dietary restrictions  Other Topics Concern   Not on file  Social History Narrative   Not on file   Social Determinants of Health   Financial Resource Strain: Low Risk    Difficulty of Paying Living Expenses: Not hard at all  Food Insecurity: No Food Insecurity   Worried About Charity fundraiser in the Last Year: Never true   [place] in the Last Year: Never true   Transportation Needs: No Transportation Needs   Lack of Transportation (Medical): No   Lack of Transportation (Non-Medical): No  Physical Activity: Inactive   Days of Exercise per Week: 0 days   Minutes of Exercise per Session: 0 min  Stress: No Stress Concern Present   Feeling of Stress : Not at all  Social Connections: Socially Isolated   Frequency of Communication with Friends and Family: More than three times a week   Frequency of Social Gatherings with Friends and Family: More than three times a week   Attends Religious Services: Never   Marine scientist or Organizations: No   Attends Archivist Meetings: Never   Marital Status: Widowed  Human resources officer Violence: Not At Risk   Fear of Current or Ex-Partner: No   Emotionally Abused: No   Physically Abused: No   Sexually Abused: No    Outpatient Medications Prior to Visit  Medication Sig Dispense Refill   Accu-Chek Softclix Lancets lancets CHECK BLOOD SUGAR ONE TIME WEEKLY 50 each 1   acetaminophen (TYLENOL) 500 MG tablet Take 500 mg by mouth 2 (two) times daily.      albuterol (ACCUNEB) 0.63 MG/3ML nebulizer solution Take 3 mLs (0.63 mg total) by nebulization every 6 (six) hours as needed for wheezing or shortness of breath. 150 mL 3   albuterol (PROVENTIL) (2.5 MG/3ML) 0.083% nebulizer solution      albuterol (VENTOLIN HFA) 108 (90 Base) MCG/ACT inhaler Inhale 2 puffs into the lungs every 4 (four) hours as needed for wheezing or shortness of breath. 54 g 3   Alcohol Swabs (ALCOHOL WIPES) 70 % PADS To use before checking blood sugars 100 each 1   allopurinol (ZYLOPRIM) 100 MG tablet TAKE 2 TABLETS ONE TIME DAILY 180 tablet 1   b complex vitamins tablet Take 1 tablet by mouth daily.     bacitracin ointment Apply 1 application topically 2 (two) times daily. 120 g 0   benzonatate (TESSALON) 100 MG capsule Take 2 capsules (200 mg total) by mouth every 8 (eight) hours. 21 capsule 0   Blood Glucose Calibration  (ACCU-CHEK AVIVA) SOLN USE AS DIRECTED 1 each 0   blood glucose meter kit and supplies Check blood sugars once weekly. 1 each 0  Blood Glucose Monitoring Suppl (ACCU-CHEK AVIVA PLUS) w/Device KIT CHECK BLOOD SUGAR ONE TIME WEEKLY 1 kit 1   budesonide-formoterol (SYMBICORT) 160-4.5 MCG/ACT inhaler Inhale 2 puffs into the lungs in the morning and at bedtime. 6 g 0   dextromethorphan-guaiFENesin (MUCINEX DM) 30-600 MG 12hr tablet Take 1 tablet by mouth 2 (two) times daily. 20 tablet 0   docusate sodium (COLACE) 100 MG capsule Take 1 capsule (100 mg total) by mouth 2 (two) times daily. 10 capsule 0   doxycycline (VIBRAMYCIN) 100 MG capsule Take 1 capsule (100 mg total) by mouth 2 (two) times daily. 10 capsule 0   Ferrous Fumarate-Folic Acid (HEMOCYTE-F) 324-1 MG TABS Take 1 tablet by mouth daily. 90 each 1   fluticasone (FLONASE) 50 MCG/ACT nasal spray Place 2 sprays into both nostrils daily. 48 g 1   FLUZONE HIGH-DOSE QUADRIVALENT 0.7 ML SUSY      furosemide (LASIX) 40 MG tablet TAKE 1 TABLET DAILY AND A SECOND TABLET DAILY AS NEEDED FOR INCREASED EDEMA OR WEIGHT GAIN GREATER THAN 3 POUNDS IN 24 HOURS 100 tablet 5   glucose blood (ACCU-CHEK AVIVA PLUS) test strip CHECK BLOOD SUGAR ONE TIME WEEKLY.  DX CODE: E11.9 50 strip 1   loratadine (CLARITIN) 10 MG tablet Take 1 tablet (10 mg total) by mouth at bedtime. (Patient taking differently: Take 10 mg by mouth at bedtime as needed for allergies.) 30 tablet 5   Magnesium Oxide 250 MG TABS Take 1 tablet (250 mg total) by mouth daily. 30 tablet 3   metoprolol succinate (TOPROL-XL) 50 MG 24 hr tablet Take 1.5 tablets (75 mg total) by mouth daily. Take with or immediately following a meal. 135 tablet 3   montelukast (SINGULAIR) 10 MG tablet Take 1 tablet (10 mg total) by mouth at bedtime. 90 tablet 2   mupirocin ointment (BACTROBAN) 2 % Apply 1 application topically 2 (two) times daily. 30 g 0   OXYGEN Inhale 2 L into the lungs continuous.      pantoprazole  (PROTONIX) 40 MG tablet Take 40 mg by mouth 2 (two) times daily.     polyethylene glycol (MIRALAX / GLYCOLAX) 17 g packet Take 17 g by mouth daily. 14 each 0   pravastatin (PRAVACHOL) 40 MG tablet Take 1 tablet (40 mg total) by mouth daily. 90 tablet 1   Probiotic Product (PROBIOTIC PO) Take 1 tablet by mouth daily.      sertraline (ZOLOFT) 100 MG tablet Take 1 tablet (100 mg total) by mouth daily. 90 tablet 1   traMADol (ULTRAM) 50 MG tablet TAKE 1 TABLET BY MOUTH THREE TIMES DAILY AS NEEDED FOR MODERATE PAIN 90 tablet 0   trolamine salicylate (ASPERCREME) 10 % cream Apply 1 application topically as needed for muscle pain.     vitamin B-12 (CYANOCOBALAMIN) 1000 MCG tablet Take 1,000 mcg by mouth daily.      No facility-administered medications prior to visit.    Allergies  Allergen Reactions   Montelukast Sodium Palpitations and Other (See Comments)    REACTION: HEART PALPITATIONS, CHEST PAIN.   Sulfa Antibiotics Other (See Comments)    CHEST PAIN   Sulfonamide Derivatives Other (See Comments)    CHEST PAIN   Oseltamivir Phosphate Diarrhea, Nausea And Vomiting and Other (See Comments)    TAMIFLU REACTION:  dizziness   Ace Inhibitors Other (See Comments)    PT. STATED UNKNOWN REACTION   Indomethacin Other (See Comments)    PT. STATED UNKNOWN REACTION    ROS  Objective:    Physical Exam Constitutional:      Appearance: She is well-developed.  Cardiovascular:     Rate and Rhythm: Normal rate and regular rhythm.     Heart sounds: Normal heart sounds. No murmur heard. Pulmonary:     Effort: Pulmonary effort is normal. No respiratory distress.     Breath sounds: Normal breath sounds. No wheezing.  Musculoskeletal:     Right lower leg: 3+ Edema present.     Left lower leg: 3+ Edema present.  Skin:    Comments: Large skin tear left shin.  Some serous drainage noted, clean base. No odor. Small skin tear left dorsal hand Erythema bilateral shins  Psychiatric:         Behavior: Behavior normal.        Thought Content: Thought content normal.        Judgment: Judgment normal.    BP 135/86 (BP Location: Right Arm, Patient Position: Sitting, Cuff Size: Small)   Pulse 79   Temp 97.9 F (36.6 C) (Oral)   Resp 16   Wt 191 lb (86.6 kg)   SpO2 99%   BMI 32.79 kg/m  Wt Readings from Last 3 Encounters:  07/25/20 191 lb (86.6 kg)  07/25/20 191 lb 6.4 oz (86.8 kg)  07/20/20 185 lb (83.9 kg)       Assessment & Plan:   Problem List Items Addressed This Visit       Unprioritized   Skin tear of left lower leg without complication - Primary    Dressing was changed today.  Will order home health RN as pt lives alone and it is difficult for her to do her own dressing changes.        Relevant Orders   Ambulatory referral to Home Health   Td : Tetanus/diphtheria >7yo Preservative  free (Completed)   Skin tear of left hand without complication    Dressing changed.       Need for prophylactic vaccination with tetanus toxoid alone   Edema    Uncontrolled. Pt is advised as follows:    Please weigh yourself daily at home. Increase lasix to 1 tab twice daily until your weight is down to 185lb, then return to once daily.        Cellulitis of lower extremity    New. Will rx with keflex 529m TID x 7 days.         I am having SEdythe Clarity"Sarahs" start on cephALEXin. I am also having her maintain her acetaminophen, vitamin B-12, OXYGEN, b complex vitamins, Probiotic Product (PROBIOTIC PO), loratadine, Ferrous Fumarate-Folic Acid, trolamine salicylate, blood glucose meter kit and supplies, pantoprazole, docusate sodium, polyethylene glycol, montelukast, metoprolol succinate, albuterol, sertraline, fluticasone, albuterol, albuterol, furosemide, Magnesium Oxide, pravastatin, Accu-Chek Aviva, dextromethorphan-guaiFENesin, benzonatate, doxycycline, Alcohol Wipes, Accu-Chek Softclix Lancets, Accu-Chek Aviva Plus, Accu-Chek Aviva Plus, allopurinol, Fluzone  High-Dose Quadrivalent, budesonide-formoterol, traMADol, mupirocin ointment, and bacitracin.  Meds ordered this encounter  Medications   cephALEXin (KEFLEX) 500 MG capsule    Sig: Take 1 capsule (500 mg total) by mouth 3 (three) times daily.    Dispense:  21 capsule    Refill:  0    Order Specific Question:   Supervising Provider    Answer:   BPenni HomansA [[[id number]]

## 2020-07-25 NOTE — Patient Instructions (Addendum)
Please weigh yourself daily at home. Increase lasix to 1 tab twice daily until your weight is down to 185lb, then return to once daily.  Start Keflex 500mg  3 x daily for 1 week for the skin infection on your shin.

## 2020-07-25 NOTE — Progress Notes (Signed)
Subjective:   [name] is a 80 y.o. female who presents for Medicare Annual (Subsequent) preventive examination.  Review of Systems     Cardiac Risk Factors include: advanced age (>66mn, >>61women);diabetes mellitus;dyslipidemia;hypertension;obesity (BMI >30kg/m2);sedentary lifestyle     Objective:    Today's Vitals   07/25/20 1337 07/25/20 1343  BP: 130/78   Pulse: 73   Resp: 18   Temp: 98.5 F (36.9 C)   TempSrc: Temporal   SpO2: 98%   Weight: 191 lb 6.4 oz (86.8 kg)   Height: _0  (1.626 m)   PainSc:  8    Body mass index is 32.85 kg/m.  Advanced Directives 07/25/2020 07/20/2020 06/25/2020 06/04/2020 05/15/2020 05/08/2020 04/08/2020  Does Patient Have a Medical Advance Directive? No No _1   Type of Advance Directive - -Public librarianLiving will HCasas AdobesLiving will HCarrierLiving will - -  Does patient want to make changes to medical advance directive? - - No - Patient declined - No - Patient declined - -  Copy of [place] Chart? - - No - copy requested No - copy requested No - copy requested - -  Would patient like information on creating a medical advance directive? Yes (MAU/Ambulatory/Procedural Areas - Information given) No - Patient declined No - Patient declined No - Patient declined No - Patient declined - -  Pre-existing out of facility DNR order (yellow form or pink MOST form) - - - - - - -    Current Medications (verified) Outpatient Encounter Medications as of 07/25/2020  Medication Sig   Accu-Chek Softclix Lancets lancets CHECK BLOOD SUGAR ONE TIME WEEKLY   acetaminophen (TYLENOL) 500 MG tablet Take 500 mg by mouth 2 (two) times daily.    albuterol (ACCUNEB) 0.63 MG/3ML nebulizer solution Take 3 mLs (0.63 mg total) by nebulization every 6 (six) hours as needed for wheezing or shortness of breath.   albuterol (PROVENTIL) (2.5 MG/3ML) 0.083% nebulizer solution     albuterol (VENTOLIN HFA) 108 (90 Base) MCG/ACT inhaler Inhale 2 puffs into the lungs every 4 (four) hours as needed for wheezing or shortness of breath.   Alcohol Swabs (ALCOHOL WIPES) 70 % PADS To use before checking blood sugars   allopurinol (ZYLOPRIM) 100 MG tablet TAKE 2 TABLETS ONE TIME DAILY   b complex vitamins tablet Take 1 tablet by mouth daily.   bacitracin ointment Apply 1 application topically 2 (two) times daily.   benzonatate (TESSALON) 100 MG capsule Take 2 capsules (200 mg total) by mouth every 8 (eight) hours.   Blood Glucose Calibration (ACCU-CHEK AVIVA) SOLN USE AS DIRECTED   blood glucose meter kit and supplies Check blood sugars once weekly.   Blood Glucose Monitoring Suppl (ACCU-CHEK AVIVA PLUS) w/Device KIT CHECK BLOOD SUGAR ONE TIME WEEKLY   budesonide-formoterol (SYMBICORT) 160-4.5 MCG/ACT inhaler Inhale 2 puffs into the lungs in the morning and at bedtime.   dextromethorphan-guaiFENesin (MUCINEX DM) 30-600 MG 12hr tablet Take 1 tablet by mouth 2 (two) times daily.   docusate sodium (COLACE) 100 MG capsule Take 1 capsule (100 mg total) by mouth 2 (two) times daily.   doxycycline (VIBRAMYCIN) 100 MG capsule Take 1 capsule (100 mg total) by mouth 2 (two) times daily.   Ferrous Fumarate-Folic Acid (HEMOCYTE-F) 324-1 MG TABS Take 1 tablet by mouth daily.   fluticasone (FLONASE) 50 MCG/ACT nasal spray Place 2 sprays into both nostrils daily.   FLUZONE HIGH-DOSE QUADRIVALENT  0.7 ML SUSY    furosemide (LASIX) 40 MG tablet TAKE 1 TABLET DAILY AND A SECOND TABLET DAILY AS NEEDED FOR INCREASED EDEMA OR WEIGHT GAIN GREATER THAN 3 POUNDS IN 24 HOURS   glucose blood (ACCU-CHEK AVIVA PLUS) test strip CHECK BLOOD SUGAR ONE TIME WEEKLY.  DX CODE: E11.9   loratadine (CLARITIN) 10 MG tablet Take 1 tablet (10 mg total) by mouth at bedtime. (Patient taking differently: Take 10 mg by mouth at bedtime as needed for allergies.)   Magnesium Oxide 250 MG TABS Take 1 tablet (250 mg total) by mouth  daily.   metoprolol succinate (TOPROL-XL) 50 MG 24 hr tablet Take 1.5 tablets (75 mg total) by mouth daily. Take with or immediately following a meal.   montelukast (SINGULAIR) 10 MG tablet Take 1 tablet (10 mg total) by mouth at bedtime.   mupirocin ointment (BACTROBAN) 2 % Apply 1 application topically 2 (two) times daily.   OXYGEN Inhale 2 L into the lungs continuous.    pantoprazole (PROTONIX) 40 MG tablet Take 40 mg by mouth 2 (two) times daily.   polyethylene glycol (MIRALAX / GLYCOLAX) 17 g packet Take 17 g by mouth daily.   pravastatin (PRAVACHOL) 40 MG tablet Take 1 tablet (40 mg total) by mouth daily.   Probiotic Product (PROBIOTIC PO) Take 1 tablet by mouth daily.    sertraline (ZOLOFT) 100 MG tablet Take 1 tablet (100 mg total) by mouth daily.   traMADol (ULTRAM) 50 MG tablet TAKE 1 TABLET BY MOUTH THREE TIMES DAILY AS NEEDED FOR MODERATE PAIN   trolamine salicylate (ASPERCREME) 10 % cream Apply 1 application topically as needed for muscle pain.   vitamin B-12 (CYANOCOBALAMIN) 1000 MCG tablet Take 1,000 mcg by mouth daily.    No facility-administered encounter medications on file as of 07/25/2020.    Allergies (verified) Montelukast sodium, Sulfa antibiotics, Sulfonamide derivatives, Oseltamivir phosphate, Ace inhibitors, and Indomethacin   History: Past Medical History:  Diagnosis Date   Abnormal glucose tolerance test    Abnormal TSH 09/22/2016   ACE-inhibitor cough    Anemia 03/12/2014   Arthritis    Asthma    PFT 02/06/09 FEV1 1.41 (65%), FVC 1.92 (64), FEV1% 74, TLC 3.47 (71%), DLCO 48%, +BD   Atypical chest pain    s/p cath, Normal coronaries, Non ST elevation myocardial infarction, Rt groin pseudoaneurysm   Bacterial vaginosis 03/12/2014   Cellulitis 06/22/2016   Chronic kidney disease (CKD), stage III (moderate) (HCC) 08/04/2016   COPD (chronic obstructive pulmonary disease) (HCC)    Depression    Diastolic heart failure ([place]) 04/07/2016   DVT (deep venous thrombosis)  ([place]) 1987   GERD (gastroesophageal reflux disease)    Gout    Hypercalcemia 10/15/2014   Hyperlipidemia    Hypertension    Hypoxia 10/15/2014   Macular degeneration 04/10/2015   Osteopenia 12/29/2006   Qualifier: Diagnosis of  By: Wynona Luna    Pneumonia    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   Family History  Problem Relation Age of Onset   Asthma Sister    Hypertension Sister    Hyperlipidemia Sister    Uterine cancer Sister    Coronary artery disease Brother        x2   Arthritis  Brother    Lung cancer Brother        smoker   Hypertension Sister    Arthritis Sister    Hyperlipidemia Sister    Emphysema Sister    COPD Sister        smoker   Heart disease Sister    Hyperlipidemia Sister    Hypertension Sister    Arthritis Sister    Emphysema Brother    Heart disease Brother         smoker   Heart attack Brother    Mental illness Father    Suicidality Father    Heart disease Mother    Hyperlipidemia Mother    Heart attack Mother    Epilepsy Daughter    Hypertension Daughter    Obesity Daughter    COPD Brother        smoker   Lung cancer Brother    Coronary artery disease Other    Colon polyps Sister    Social History   Socioeconomic History   Marital status: Widowed    Spouse name: Not on file   Number of children: 1   Years of education: Not on file   Highest education level: Not on file  Occupational History   Occupation: Retired    Fish farm manager: [employer]  Tobacco Use   Smoking status: Never   Smokeless tobacco: Never  Vaping Use   Vaping Use: Never used  Substance and Sexual Activity   Alcohol use: No   Drug use: No   Sexual activity: Never    Comment: lives alone, no dietary restrictions  Other Topics Concern    Not on file  Social History Narrative   Not on file   Social Determinants of Health   Financial Resource Strain: Low Risk    Difficulty of Paying Living Expenses: Not hard at all  Food Insecurity: No Food Insecurity   Worried About Charity fundraiser in the Last Year: Never true   [place] in the Last Year: Never true  Transportation Needs: No Transportation Needs   Lack of Transportation (Medical): No   Lack of Transportation (Non-Medical): No  Physical Activity: Inactive   Days of Exercise per Week: 0 days   Minutes of Exercise per Session: 0 min  Stress: No Stress Concern Present   Feeling of Stress : Not at all  Social Connections: Socially Isolated   Frequency of Communication with Friends and Family: More than three times a week   Frequency of Social Gatherings with Friends and Family: More than three times a week   Attends Religious Services: Never   Marine scientist or Organizations: No   Attends Archivist Meetings: Never   Marital Status: Widowed    Tobacco Counseling Counseling given: Not Answered   Clinical Intake:  Pre-visit preparation completed: Yes  Pain : 0-10 Pain Score: 8  Pain Type: Acute pain Pain Location: Leg Pain Orientation: Left Pain Onset: In the past 7 days Pain Frequency: Intermittent     Nutritional Status: BMI > 30  Obese Nutritional Risks: None  How often do you need to have someone help you when you read instructions, pamphlets, or other written materials from your doctor or pharmacy?: 1 - Never  Diabetes:  Is the patient diabetic?  No  If diabetic, was a CBG obtained today?  No  Did the patient bring in their glucometer from home?  No  How often do you monitor your CBG's? occasionally.  Financial Strains and Diabetes Management:  Are you having any financial strains with the device, your supplies or your medication? No .  Does the patient want to be seen by Chronic Care Management for management of  their diabetes?  No  Would the patient like to be referred to a Nutritionist or for Diabetic Management?  No   Diabetic Exams:  Diabetic Eye Exam:  Overdue for diabetic eye exam. Pt has been advised about the importance in completing this exam.  Diabetic Foot Exam: Completed 06/26/2020.   Interpreter Needed?: No  Information entered by :: Caroleen Hamman LPN   Activities of Daily Living In your present state of health, do you have any difficulty performing the following activities: 07/25/2020 06/26/2020  Hearing? N Y  Vision? N Y  Difficulty concentrating or making decisions? N N  Walking or climbing stairs? N Y  Dressing or bathing? N N  Doing errands, shopping? N N  Preparing Food and eating ? N -  Using the Toilet? N -  In the past six months, have you accidently leaked urine? N -  Do you have problems with loss of bowel control? N -  Managing your Medications? N -  Managing your Finances? N -  Housekeeping or managing your Housekeeping? N -  Some recent data might be hidden    Patient Care Team: Mosie Lukes, MD as PCP - General (Family Medicine) Sueanne Margarita, MD as PCP - Cardiology (Cardiology) Shirley Muscat Loreen Freud, MD as Referring Physician (Optometry) Parrett, Fonnie Mu, NP as Nurse Practitioner (Pulmonary Disease) Rigoberto Noel, MD as Consulting Physician (Pulmonary Disease) Donato Heinz, MD as Consulting Physician (Nephrology) Warden Fillers, MD as Consulting Physician (Ophthalmology)  Indicate any recent Medical Services you may have received from other than [facility] providers in the past year (date may be approximate).     Assessment:   This is a routine wellness examination for [name].  Hearing/Vision screen Hearing Screening - Comments:: C/o a decrease in hearing  Vision Screening - Comments:: Last eye exam-2021  Dietary issues and exercise activities discussed: Current Exercise Habits: The patient does not participate in regular exercise at present,  Exercise limited by: respiratory conditions(s) (uses oxygen all the time)   Goals Addressed             This Visit's Progress    Eat more fruits and vegetables   On track    Go to bed at the same time every night.   Not on track    Patient Stated       Increase activity        Depression Screen PHQ 2/9 Scores 07/25/2020 02/23/2020 02/10/2019 02/08/2018 02/05/2017 11/28/2016 01/07/2016  PHQ - 2 Score 1 0 0 0 _0 PHQ- 9 Score - - - - - - 8    Fall Risk Fall Risk  07/25/2020 02/23/2020 02/10/2019 02/08/2018 02/05/2017  Falls in the past year? 1 0 1 1 No  Number falls in past yr: 0 0 0 0 -  Comment - - - - -  Injury with Fall? 1 0 0 1 -  Comment - - - - -  Risk for fall due to : History of fall(s) - - - -  Follow up Falls prevention discussed - Education provided;Falls prevention discussed - -    FALL RISK PREVENTION PERTAINING TO THE HOME:  Any stairs in or around the home? No  Home free of loose throw rugs in walkways, pet beds, electrical  cords, etc? Yes  Adequate lighting in your home to reduce risk of falls? Yes   ASSISTIVE DEVICES UTILIZED TO PREVENT FALLS:  Life alert? No  Use of a cane, walker or w/c? Yes  Grab bars in the bathroom? Yes  Shower chair or bench in shower? No  Elevated toilet seat or a handicapped toilet? No   TIMED UP AND GO:  Was the test performed? Yes .  Length of time to ambulate 10 feet: 12 sec.   Gait slow and steady with assistive device  Cognitive Function:Normal cognitive status assessed by direct observation by this Nurse Health Advisor. No abnormalities found.   MMSE - Mini Mental State Exam 02/05/2017 01/07/2016  Orientation to time 5 4  Orientation to Place 5 5  Registration 3 3  Attention/ Calculation 5 5  Recall 3 3  Language- name 2 objects 2 2  Language- repeat 1 1  Language- follow 3 step command 3 3  Language- read & follow direction 1 1  Write a sentence 1 1  Copy design 1 0  Total score 30 28         Immunizations Immunization History  Administered Date(s) Administered   Fluad Quad(high Dose 65+) 11/11/2018   Influenza Split 12/08/2011   Influenza Whole 10/20/2006, 11/09/2007, 11/14/2008, 10/30/2009   Influenza, High Dose Seasonal PF 09/25/2015, 10/21/2015, 10/26/2017   Influenza,inj,Quad PF,6+ Mos 10/20/2012, 11/28/2013, 10/02/2014, 09/18/2016   PFIZER(Purple Top)SARS-COV-2 Vaccination 03/19/2019, 05/03/2019, 01/04/2020   Pneumococcal Conjugate-13 02/21/2013   Pneumococcal Polysaccharide-23 06/30/2006   Td 06/01/2007    TDAP status: Due, Education has been provided regarding the importance of this vaccine. Advised may receive this vaccine at local pharmacy or Health Dept. Aware to provide a copy of the vaccination record if obtained from local pharmacy or Health Dept. Verbalized acceptance and understanding.  Flu Vaccine status: Up to date  Pneumococcal vaccine status: Up to date  Covid-19 vaccine status: Completed vaccines  Qualifies for Shingles Vaccine? Yes   Zostavax completed No   Shingrix Completed?: No.    Education has been provided regarding the importance of this vaccine. Patient has been advised to call insurance company to determine out of pocket expense if they have not yet received this vaccine. Advised may also receive vaccine at local pharmacy or Health Dept. Verbalized acceptance and understanding.  Screening Tests Health Maintenance  Topic Date Due   Zoster Vaccines- Shingrix (1 of 2) Never done   TETANUS/TDAP  05/31/2017   COLONOSCOPY (Pts 45-65yr Insurance coverage will need to be confirmed)  11/10/2018   OPHTHALMOLOGY EXAM  07/28/2019   COVID-19 Vaccine (4 - Booster for Pfizer series) 04/03/2020   INFLUENZA VACCINE  08/05/2028 (Originally 08/20/2020)   HEMOGLOBIN A1C  08/22/2020   URINE MICROALBUMIN  02/22/2021   FOOT EXAM  06/26/2021   DEXA SCAN  Completed   Hepatitis C Screening  Completed   PNA vac Low Risk Adult  Completed   HPV VACCINES  Aged  Out    Health Maintenance  Health Maintenance Due  Topic Date Due   Zoster Vaccines- Shingrix (1 of 2) Never done   TETANUS/TDAP  05/31/2017   COLONOSCOPY (Pts 45-481yrInsurance coverage will need to be confirmed)  11/10/2018   OPHTHALMOLOGY EXAM  07/28/2019   COVID-19 Vaccine (4 - Booster for PfClarissaeries) 04/03/2020    Colorectal cancer screening: No longer required.   Mammogram status: Ordered today. Pt provided with contact info and advised to call to schedule appt.   Bone Density status:  Ordered oday. Pt provided with contact info and advised to call to schedule appt.  Lung Cancer Screening: (Low Dose CT Chest recommended if Age 71-80 years, 30 pack-year currently smoking OR have quit w/in 15years.) does not qualify.     Additional Screening:  Hepatitis C Screening: does not qualify  Vision Screening: Recommended annual ophthalmology exams for early detection of glaucoma and other disorders of the eye. Is the patient up to date with their annual eye exam?  No  Who is the provider or what is the name of the office in which the patient attends annual eye exams? Unsure of name   Dental Screening: Recommended annual dental exams for proper oral hygiene  Community Resource Referral / Chronic Care Management: CRR required this visit?  No   CCM required this visit?  No      Plan:     I have personally reviewed and noted the following in the patient's chart:   Medical and social history Use of alcohol, tobacco or illicit drugs  Current medications and supplements including opioid prescriptions.  Functional ability and status Nutritional status Physical activity Advanced directives List of other physicians Hospitalizations, surgeries, and ER visits in previous 12 months Vitals Screenings to include cognitive, depression, and falls Referrals and appointments  In addition, I have reviewed and discussed with patient certain preventive protocols, quality metrics,  and best practice recommendations. A written personalized care plan for preventive services as well as general preventive health recommendations were provided to patient.     Marta Antu, LPN   05/23/102  Nurse Health Advisor  Nurse Notes: Patient c/o leg pain form a fall last week. She was seen in ER. Asking if she can see provider today. Appt made with [name] for 3:00 today.

## 2020-07-25 NOTE — Patient Instructions (Signed)
[name] , Thank you for taking time to come for your Medicare Wellness Visit. I appreciate your ongoing commitment to your health goals. Please review the following plan we discussed and let me know if I can assist you in the future.   Screening recommendations/referrals: Colonoscopy: No longer required Mammogram: Ordered today. Someone will be calling you to schedule. Bone Density: Ordered today. Someone will be calling you to schedule. Recommended yearly ophthalmology/optometry visit for glaucoma screening and checkup Recommended yearly dental visit for hygiene and checkup  Vaccinations: Influenza vaccine: Up to date Pneumococcal vaccine: Up to date Tdap vaccine: Due-Discuss at today's visit with provider. Shingles vaccine: Discuss with pharmacy   Covid-19:Up to date  Advanced directives: Information given today  Conditions/risks identified: See problem list  Next appointment: Follow up in one year for your annual wellness visit    Preventive Care 65 Years and Older, Female Preventive care refers to lifestyle choices and visits with your health care provider that can promote health and wellness. What does preventive care include? A yearly physical exam. This is also called an annual well check. Dental exams once or twice a year. Routine eye exams. Ask your health care provider how often you should have your eyes checked. Personal lifestyle choices, including: Daily care of your teeth and gums. Regular physical activity. Eating a healthy diet. Avoiding tobacco and drug use. Limiting alcohol use. Practicing safe sex. Taking low-dose aspirin every day. Taking vitamin and mineral supplements as recommended by your health care provider. What happens during an annual well check? The services and screenings done by your health care provider during your annual well check will depend on your age, overall health, lifestyle risk factors, and family history of disease. Counseling   Your health care provider may ask you questions about your: Alcohol use. Tobacco use. Drug use. Emotional well-being. Home and relationship well-being. Sexual activity. Eating habits. History of falls. Memory and ability to understand (cognition). Work and work Statistician. Reproductive health. Screening  You may have the following tests or measurements: Height, weight, and BMI. Blood pressure. Lipid and cholesterol levels. These may be checked every 5 years, or more frequently if you are over 61 years old. Skin check. Lung cancer screening. You may have this screening every year starting at age 69 if you have a 30-pack-year history of smoking and currently smoke or have quit within the past 15 years. Fecal occult blood test (FOBT) of the stool. You may have this test every year starting at age 70. Flexible sigmoidoscopy or colonoscopy. You may have a sigmoidoscopy every 5 years or a colonoscopy every 10 years starting at age 64. Hepatitis C blood test. Hepatitis B blood test. Sexually transmitted disease (STD) testing. Diabetes screening. This is done by checking your blood sugar (glucose) after you have not eaten for a while (fasting). You may have this done every 1-3 years. Bone density scan. This is done to screen for osteoporosis. You may have this done starting at age 32. Mammogram. This may be done every 1-2 years. Talk to your health care provider about how often you should have regular mammograms. Talk with your health care provider about your test results, treatment options, and if necessary, the need for more tests. Vaccines  Your health care provider may recommend certain vaccines, such as: Influenza vaccine. This is recommended every year. Tetanus, diphtheria, and acellular pertussis (Tdap, Td) vaccine. You may need a Td booster every 10 years. Zoster vaccine. You may need this after age 101.  Pneumococcal 13-valent conjugate (PCV13) vaccine. One dose is recommended  after age 3. Pneumococcal polysaccharide (PPSV23) vaccine. One dose is recommended after age 49. Talk to your health care provider about which screenings and vaccines you need and how often you need them. This information is not intended to replace advice given to you by your health care provider. Make sure you discuss any questions you have with your health care provider. Document Released: 02/02/2015 Document Revised: 09/26/2015 Document Reviewed: 11/07/2014 Elsevier Interactive Patient Education  2017 [place] Prevention in the Home Falls can cause injuries. They can happen to people of all ages. There are many things you can do to make your home safe and to help prevent falls. What can I do on the outside of my home? Regularly fix the edges of walkways and driveways and fix any cracks. Remove anything that might make you trip as you walk through a door, such as a raised step or threshold. Trim any bushes or trees on the path to your home. Use bright outdoor lighting. Clear any walking paths of anything that might make someone trip, such as rocks or tools. Regularly check to see if handrails are loose or broken. Make sure that both sides of any steps have handrails. Any raised decks and porches should have guardrails on the edges. Have any leaves, snow, or ice cleared regularly. Use sand or salt on walking paths during winter. Clean up any spills in your garage right away. This includes oil or grease spills. What can I do in the bathroom? Use night lights. Install grab bars by the toilet and in the tub and shower. Do not use towel bars as grab bars. Use non-skid mats or decals in the tub or shower. If you need to sit down in the shower, use a plastic, non-slip stool. Keep the floor dry. Clean up any water that spills on the floor as soon as it happens. Remove soap buildup in the tub or shower regularly. Attach bath mats securely with double-sided non-slip rug tape. Do not  have throw rugs and other things on the floor that can make you trip. What can I do in the bedroom? Use night lights. Make sure that you have a light by your bed that is easy to reach. Do not use any sheets or blankets that are too big for your bed. They should not hang down onto the floor. Have a firm chair that has side arms. You can use this for support while you get dressed. Do not have throw rugs and other things on the floor that can make you trip. What can I do in the kitchen? Clean up any spills right away. Avoid walking on wet floors. Keep items that you use a lot in easy-to-reach places. If you need to reach something above you, use a strong step stool that has a grab bar. Keep electrical cords out of the way. Do not use floor polish or wax that makes floors slippery. If you must use wax, use non-skid floor wax. Do not have throw rugs and other things on the floor that can make you trip. What can I do with my stairs? Do not leave any items on the stairs. Make sure that there are handrails on both sides of the stairs and use them. Fix handrails that are broken or loose. Make sure that handrails are as long as the stairways. Check any carpeting to make sure that it is firmly attached to the stairs. Fix any  carpet that is loose or worn. Avoid having throw rugs at the top or bottom of the stairs. If you do have throw rugs, attach them to the floor with carpet tape. Make sure that you have a light switch at the top of the stairs and the bottom of the stairs. If you do not have them, ask someone to add them for you. What else can I do to help prevent falls? Wear shoes that: Do not have high heels. Have rubber bottoms. Are comfortable and fit you well. Are closed at the toe. Do not wear sandals. If you use a stepladder: Make sure that it is fully opened. Do not climb a closed stepladder. Make sure that both sides of the stepladder are locked into place. Ask someone to hold it for  you, if possible. Clearly mark and make sure that you can see: Any grab bars or handrails. First and last steps. Where the edge of each step is. Use tools that help you move around (mobility aids) if they are needed. These include: Canes. Walkers. Scooters. Crutches. Turn on the lights when you go into a dark area. Replace any light bulbs as soon as they burn out. Set up your furniture so you have a clear path. Avoid moving your furniture around. If any of your floors are uneven, fix them. If there are any pets around you, be aware of where they are. Review your medicines with your doctor. Some medicines can make you feel dizzy. This can increase your chance of falling. Ask your doctor what other things that you can do to help prevent falls. This information is not intended to replace advice given to you by your health care provider. Make sure you discuss any questions you have with your health care provider. Document Released: 11/02/2008 Document Revised: 06/14/2015 Document Reviewed: 02/10/2014 Elsevier Interactive Patient Education  2017 [employer].

## 2020-07-27 ENCOUNTER — Ambulatory Visit: Payer: Medicare HMO | Admitting: Family

## 2020-07-27 ENCOUNTER — Other Ambulatory Visit: Payer: Self-pay | Admitting: Family Medicine

## 2020-07-27 ENCOUNTER — Other Ambulatory Visit: Payer: Self-pay | Admitting: Internal Medicine

## 2020-07-27 DIAGNOSIS — S51812D Laceration without foreign body of left forearm, subsequent encounter: Secondary | ICD-10-CM | POA: Diagnosis not present

## 2020-07-27 DIAGNOSIS — I130 Hypertensive heart and chronic kidney disease with heart failure and stage 1 through stage 4 chronic kidney disease, or unspecified chronic kidney disease: Secondary | ICD-10-CM | POA: Diagnosis not present

## 2020-07-27 DIAGNOSIS — J449 Chronic obstructive pulmonary disease, unspecified: Secondary | ICD-10-CM | POA: Diagnosis not present

## 2020-07-27 DIAGNOSIS — R609 Edema, unspecified: Secondary | ICD-10-CM | POA: Insufficient documentation

## 2020-07-27 DIAGNOSIS — F32A Depression, unspecified: Secondary | ICD-10-CM | POA: Diagnosis not present

## 2020-07-27 DIAGNOSIS — E1122 Type 2 diabetes mellitus with diabetic chronic kidney disease: Secondary | ICD-10-CM | POA: Diagnosis not present

## 2020-07-27 DIAGNOSIS — I5042 Chronic combined systolic (congestive) and diastolic (congestive) heart failure: Secondary | ICD-10-CM | POA: Diagnosis not present

## 2020-07-27 DIAGNOSIS — D631 Anemia in chronic kidney disease: Secondary | ICD-10-CM | POA: Diagnosis not present

## 2020-07-27 DIAGNOSIS — Z23 Encounter for immunization: Secondary | ICD-10-CM | POA: Insufficient documentation

## 2020-07-27 DIAGNOSIS — S61412A Laceration without foreign body of left hand, initial encounter: Secondary | ICD-10-CM | POA: Insufficient documentation

## 2020-07-27 DIAGNOSIS — S81812A Laceration without foreign body, left lower leg, initial encounter: Secondary | ICD-10-CM | POA: Insufficient documentation

## 2020-07-27 DIAGNOSIS — N184 Chronic kidney disease, stage 4 (severe): Secondary | ICD-10-CM | POA: Diagnosis not present

## 2020-07-27 DIAGNOSIS — S81812D Laceration without foreign body, left lower leg, subsequent encounter: Secondary | ICD-10-CM | POA: Diagnosis not present

## 2020-07-27 NOTE — Assessment & Plan Note (Signed)
Uncontrolled. Pt is advised as follows:    Please weigh yourself daily at home. Increase lasix to 1 tab twice daily until your weight is down to 185lb, then return to once daily.

## 2020-07-27 NOTE — Telephone Encounter (Signed)
Requesting: tramadol 50mg  Contract:12/23/2016 UDS:12/23/2016 Last Visit: 06/26/2020 Next Visit: 10/08/2020 Last Refill: 05/30/2020 #90 and 0RF Pt sig: 1 tab tid prn  Please Advise

## 2020-07-27 NOTE — Assessment & Plan Note (Signed)
Dressing changed.

## 2020-07-27 NOTE — Assessment & Plan Note (Signed)
Dressing was changed today.  Will order home health RN as pt lives alone and it is difficult for her to do her own dressing changes.

## 2020-07-27 NOTE — Assessment & Plan Note (Signed)
New. Will rx with keflex 500mg  TID x 7 days.

## 2020-07-31 ENCOUNTER — Telehealth: Payer: Self-pay

## 2020-07-31 ENCOUNTER — Ambulatory Visit: Payer: Medicare HMO | Admitting: Family

## 2020-07-31 DIAGNOSIS — D631 Anemia in chronic kidney disease: Secondary | ICD-10-CM | POA: Diagnosis not present

## 2020-07-31 DIAGNOSIS — N184 Chronic kidney disease, stage 4 (severe): Secondary | ICD-10-CM | POA: Diagnosis not present

## 2020-07-31 DIAGNOSIS — I5042 Chronic combined systolic (congestive) and diastolic (congestive) heart failure: Secondary | ICD-10-CM | POA: Diagnosis not present

## 2020-07-31 DIAGNOSIS — I130 Hypertensive heart and chronic kidney disease with heart failure and stage 1 through stage 4 chronic kidney disease, or unspecified chronic kidney disease: Secondary | ICD-10-CM | POA: Diagnosis not present

## 2020-07-31 DIAGNOSIS — S51812D Laceration without foreign body of left forearm, subsequent encounter: Secondary | ICD-10-CM | POA: Diagnosis not present

## 2020-07-31 DIAGNOSIS — S81812D Laceration without foreign body, left lower leg, subsequent encounter: Secondary | ICD-10-CM | POA: Diagnosis not present

## 2020-07-31 DIAGNOSIS — J449 Chronic obstructive pulmonary disease, unspecified: Secondary | ICD-10-CM | POA: Diagnosis not present

## 2020-07-31 DIAGNOSIS — E1122 Type 2 diabetes mellitus with diabetic chronic kidney disease: Secondary | ICD-10-CM | POA: Diagnosis not present

## 2020-07-31 DIAGNOSIS — F32A Depression, unspecified: Secondary | ICD-10-CM | POA: Diagnosis not present

## 2020-07-31 NOTE — Telephone Encounter (Signed)
Verbal done 

## 2020-07-31 NOTE — Telephone Encounter (Signed)
[name] with [facility] saw pt per request of Melissa (pt saw Melissa recently).  Pt has a skin tear on lower L leg and is on antibiotic.  Pt has a history of cellulitis and [name] is wanting to put pt in bi-lat unaboots.  The R leg is warm to the touch and red.  [name] is wanting VO for dressings to be changed 1x per week or 2x per week. She can be reached at [phone] and secure VM can be left for her on that line.

## 2020-08-01 ENCOUNTER — Encounter (HOSPITAL_BASED_OUTPATIENT_CLINIC_OR_DEPARTMENT_OTHER): Payer: Medicare HMO | Admitting: Physician Assistant

## 2020-08-03 ENCOUNTER — Telehealth: Payer: Self-pay | Admitting: Family Medicine

## 2020-08-03 DIAGNOSIS — D631 Anemia in chronic kidney disease: Secondary | ICD-10-CM | POA: Diagnosis not present

## 2020-08-03 DIAGNOSIS — J449 Chronic obstructive pulmonary disease, unspecified: Secondary | ICD-10-CM | POA: Diagnosis not present

## 2020-08-03 DIAGNOSIS — I130 Hypertensive heart and chronic kidney disease with heart failure and stage 1 through stage 4 chronic kidney disease, or unspecified chronic kidney disease: Secondary | ICD-10-CM | POA: Diagnosis not present

## 2020-08-03 DIAGNOSIS — N184 Chronic kidney disease, stage 4 (severe): Secondary | ICD-10-CM | POA: Diagnosis not present

## 2020-08-03 DIAGNOSIS — S81812D Laceration without foreign body, left lower leg, subsequent encounter: Secondary | ICD-10-CM | POA: Diagnosis not present

## 2020-08-03 DIAGNOSIS — S51812D Laceration without foreign body of left forearm, subsequent encounter: Secondary | ICD-10-CM | POA: Diagnosis not present

## 2020-08-03 DIAGNOSIS — E1122 Type 2 diabetes mellitus with diabetic chronic kidney disease: Secondary | ICD-10-CM | POA: Diagnosis not present

## 2020-08-03 DIAGNOSIS — F32A Depression, unspecified: Secondary | ICD-10-CM | POA: Diagnosis not present

## 2020-08-03 DIAGNOSIS — I5042 Chronic combined systolic (congestive) and diastolic (congestive) heart failure: Secondary | ICD-10-CM | POA: Diagnosis not present

## 2020-08-03 NOTE — Telephone Encounter (Signed)
Xeroform is fine please.

## 2020-08-03 NOTE — Telephone Encounter (Signed)
The dressings under her Unna boot were stuck and pt is refusing to use that wrap now. RN needs an alternative to use. Xeroform possibly?

## 2020-08-03 NOTE — Telephone Encounter (Signed)
Sherri informed.

## 2020-08-06 ENCOUNTER — Telehealth: Payer: Self-pay

## 2020-08-06 ENCOUNTER — Inpatient Hospital Stay: Payer: Medicare HMO | Admitting: Family

## 2020-08-06 ENCOUNTER — Inpatient Hospital Stay: Payer: Medicare HMO

## 2020-08-06 ENCOUNTER — Other Ambulatory Visit: Payer: Self-pay | Admitting: Internal Medicine

## 2020-08-07 ENCOUNTER — Ambulatory Visit (INDEPENDENT_AMBULATORY_CARE_PROVIDER_SITE_OTHER): Payer: Medicare HMO | Admitting: Family

## 2020-08-07 ENCOUNTER — Other Ambulatory Visit: Payer: Self-pay

## 2020-08-07 DIAGNOSIS — R600 Localized edema: Secondary | ICD-10-CM | POA: Diagnosis not present

## 2020-08-07 DIAGNOSIS — S81812A Laceration without foreign body, left lower leg, initial encounter: Secondary | ICD-10-CM | POA: Diagnosis not present

## 2020-08-07 NOTE — Progress Notes (Signed)
Subjective:     Patient ID: [name], female    DOB: 1940-12-13, 80 y.o.   MRN: [id number]  Chief Complaint  Patient presents with   Wound Check    HPI Patient is in today for wound check.  She completed a 1 week course of keflex $RemoveB'500mg'PaWwCJXn$ .  The home health RN has been coming to her home to perform dressing changes.    Wt Readings from Last 3 Encounters:  08/07/20 184 lb 6.4 oz (83.6 kg)  07/25/20 191 lb (86.6 kg)  07/25/20 191 lb 6.4 oz (86.8 kg)     Health Maintenance Due  Topic Date Due   Zoster Vaccines- Shingrix (1 of 2) Never done   COLONOSCOPY (Pts 45-17yrs Insurance coverage will need to be confirmed)  11/10/2018   OPHTHALMOLOGY EXAM  07/28/2019   COVID-19 Vaccine (4 - Booster for Pfizer series) 04/03/2020    Past Medical History:  Diagnosis Date   Abnormal glucose tolerance test    Abnormal TSH 09/22/2016   ACE-inhibitor cough    Anemia 03/12/2014   Arthritis    Asthma    PFT 02/06/09 FEV1 1.41 (65%), FVC 1.92 (64), FEV1% 74, TLC 3.47 (71%), DLCO 48%, +BD   Atypical chest pain    s/p cath, Normal coronaries, Non ST elevation myocardial infarction, Rt groin pseudoaneurysm   Bacterial vaginosis 03/12/2014   Cellulitis 06/22/2016   Chronic kidney disease (CKD), stage III (moderate) ([place]) 08/04/2016   COPD (chronic obstructive pulmonary disease) (HCC)    Depression    Diastolic heart failure ([place]) 04/07/2016   DVT (deep venous thrombosis) ([place]) 1987   GERD (gastroesophageal reflux disease)    Gout    Hypercalcemia 10/15/2014   Hyperlipidemia    Hypertension    Hypoxia 10/15/2014   Macular degeneration 04/10/2015   Osteopenia 12/29/2006   Qualifier: Diagnosis of  By: Shawna Orleans DO, Sandy Salaam    Pneumonia    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   Renal insufficiency 09/22/2016   Unspecified constipation 06/05/2013   Vitamin D deficiency 01/01/2015    Past Surgical History:  Procedure Laterality Date   [place]     Family History  Problem Relation Age of Onset   Asthma Sister    Hypertension Sister    Hyperlipidemia Sister    Uterine cancer Sister    Coronary artery disease Brother        x2   Arthritis Brother    Lung cancer Brother        smoker   Hypertension Sister    Arthritis Sister    Hyperlipidemia Sister    Emphysema Sister    COPD Sister        smoker   Heart disease Sister    Hyperlipidemia Sister    Hypertension Sister    Arthritis Sister    Emphysema Brother    Heart disease Brother         smoker   Heart attack Brother    Mental illness Father    Suicidality Father    Heart disease Mother    Hyperlipidemia Mother    Heart attack Mother    Epilepsy Daughter    Hypertension Daughter    Obesity Daughter    COPD Brother        smoker   Lung cancer Brother    Coronary artery disease Other    Colon polyps Sister     Social History  Socioeconomic History   Marital status: Widowed    Spouse name: Not on file   Number of children: 1   Years of education: Not on file   Highest education level: Not on file  Occupational History   Occupation: Retired    Fish farm manager: [employer]  Tobacco Use   Smoking status: Never   Smokeless tobacco: Never  Vaping Use   Vaping Use: Never used  Substance and Sexual Activity   Alcohol use: No   Drug use: No   Sexual activity: Never    Comment: lives alone, no dietary restrictions  Other Topics Concern   Not on file  Social History Narrative   Not on file   Social Determinants of Health   Financial Resource Strain: Low Risk    Difficulty of Paying Living Expenses: Not hard at all  Food Insecurity: No Food Insecurity   Worried About Charity fundraiser in the Last Year: Never true   [place] in the Last Year: Never true  Transportation Needs: No Transportation Needs   Lack of Transportation (Medical): No   Lack of Transportation (Non-Medical): No  Physical Activity: Inactive   Days of Exercise per Week: 0 days    Minutes of Exercise per Session: 0 min  Stress: No Stress Concern Present   Feeling of Stress : Not at all  Social Connections: Socially Isolated   Frequency of Communication with Friends and Family: More than three times a week   Frequency of Social Gatherings with Friends and Family: More than three times a week   Attends Religious Services: Never   Marine scientist or Organizations: No   Attends Archivist Meetings: Never   Marital Status: Widowed  Human resources officer Violence: Not At Risk   Fear of Current or Ex-Partner: No   Emotionally Abused: No   Physically Abused: No   Sexually Abused: No    Outpatient Medications Prior to Visit  Medication Sig Dispense Refill   Accu-Chek Softclix Lancets lancets CHECK BLOOD SUGAR ONE TIME WEEKLY 50 each 1   acetaminophen (TYLENOL) 500 MG tablet Take 500 mg by mouth 2 (two) times daily.      albuterol (ACCUNEB) 0.63 MG/3ML nebulizer solution Take 3 mLs (0.63 mg total) by nebulization every 6 (six) hours as needed for wheezing or shortness of breath. 150 mL 3   albuterol (PROVENTIL) (2.5 MG/3ML) 0.083% nebulizer solution      albuterol (VENTOLIN HFA) 108 (90 Base) MCG/ACT inhaler Inhale 2 puffs into the lungs every 4 (four) hours as needed for wheezing or shortness of breath. 54 g 3   Alcohol Swabs (ALCOHOL WIPES) 70 % PADS To use before checking blood sugars 100 each 1   allopurinol (ZYLOPRIM) 100 MG tablet TAKE 2 TABLETS ONE TIME DAILY 180 tablet 1   b complex vitamins tablet Take 1 tablet by mouth daily.     bacitracin ointment Apply 1 application topically 2 (two) times daily. 120 g 0   Blood Glucose Calibration (ACCU-CHEK AVIVA) SOLN USE AS DIRECTED 1 each 0   blood glucose meter kit and supplies Check blood sugars once weekly. 1 each 0   Blood Glucose Monitoring Suppl (ACCU-CHEK AVIVA PLUS) w/Device KIT CHECK BLOOD SUGAR ONE TIME WEEKLY 1 kit 1   budesonide-formoterol (SYMBICORT) 160-4.5 MCG/ACT inhaler Inhale 2 puffs into  the lungs in the morning and at bedtime. 6 g 0   cephALEXin (KEFLEX) 500 MG capsule Take 1 capsule (500 mg total) by mouth 3 (  three) times daily. 21 capsule 0   dextromethorphan-guaiFENesin (MUCINEX DM) 30-600 MG 12hr tablet Take 1 tablet by mouth 2 (two) times daily. 20 tablet 0   docusate sodium (COLACE) 100 MG capsule Take 1 capsule (100 mg total) by mouth 2 (two) times daily. 10 capsule 0   doxycycline (VIBRAMYCIN) 100 MG capsule Take 1 capsule (100 mg total) by mouth 2 (two) times daily. 10 capsule 0   Ferrous Fumarate-Folic Acid (HEMOCYTE-F) 324-1 MG TABS Take 1 tablet by mouth daily. 90 each 1   fluticasone (FLONASE) 50 MCG/ACT nasal spray Place 2 sprays into both nostrils daily. 48 g 1   FLUZONE HIGH-DOSE QUADRIVALENT 0.7 ML SUSY      furosemide (LASIX) 40 MG tablet TAKE 1 TABLET DAILY AND A SECOND TABLET DAILY AS NEEDED FOR INCREASED EDEMA OR WEIGHT GAIN GREATER THAN 3 POUNDS IN 24 HOURS 100 tablet 5   glucose blood (ACCU-CHEK AVIVA PLUS) test strip CHECK BLOOD SUGAR ONE TIME WEEKLY.  DX CODE: E11.9 50 strip 1   loratadine (CLARITIN) 10 MG tablet Take 1 tablet (10 mg total) by mouth at bedtime. (Patient taking differently: Take 10 mg by mouth at bedtime as needed for allergies.) 30 tablet 5   Magnesium Oxide 250 MG TABS Take 1 tablet (250 mg total) by mouth daily. 30 tablet 3   metoprolol succinate (TOPROL-XL) 50 MG 24 hr tablet TAKE 1 AND 1/2 TABLETS EVERY DAY WITH OR IMMEDIATELY FOLLOWING A MEAL 135 tablet 1   montelukast (SINGULAIR) 10 MG tablet Take 1 tablet (10 mg total) by mouth at bedtime. 90 tablet 2   mupirocin ointment (BACTROBAN) 2 % Apply 1 application topically 2 (two) times daily. 30 g 0   OXYGEN Inhale 2 L into the lungs continuous.      pantoprazole (PROTONIX) 40 MG tablet Take 40 mg by mouth 2 (two) times daily.     polyethylene glycol (MIRALAX / GLYCOLAX) 17 g packet Take 17 g by mouth daily. 14 each 0   pravastatin (PRAVACHOL) 40 MG tablet Take 1 tablet (40 mg total) by  mouth daily. 90 tablet 1   Probiotic Product (PROBIOTIC PO) Take 1 tablet by mouth daily.      sertraline (ZOLOFT) 100 MG tablet Take 1 tablet (100 mg total) by mouth daily. 90 tablet 1   traMADol (ULTRAM) 50 MG tablet TAKE 1 TABLET BY MOUTH THREE TIMES DAILY AS NEEDED FOR MODERATE PAIN 90 tablet 0   trolamine salicylate (ASPERCREME) 10 % cream Apply 1 application topically as needed for muscle pain.     vitamin B-12 (CYANOCOBALAMIN) 1000 MCG tablet Take 1,000 mcg by mouth daily.      benzonatate (TESSALON) 100 MG capsule Take 2 capsules (200 mg total) by mouth every 8 (eight) hours. 21 capsule 0   No facility-administered medications prior to visit.    Allergies  Allergen Reactions   Montelukast Sodium Palpitations and Other (See Comments)    REACTION: HEART PALPITATIONS, CHEST PAIN.   Sulfa Antibiotics Other (See Comments)    CHEST PAIN   Sulfonamide Derivatives Other (See Comments)    CHEST PAIN   Oseltamivir Phosphate Diarrhea, Nausea And Vomiting and Other (See Comments)    TAMIFLU REACTION:  dizziness   Ace Inhibitors Other (See Comments)    PT. STATED UNKNOWN REACTION   Indomethacin Other (See Comments)    PT. STATED UNKNOWN REACTION    ROS     Objective:    Physical Exam Constitutional:      Appearance:  Normal appearance.  Cardiovascular:     Rate and Rhythm: Normal rate.  Pulmonary:     Effort: Pulmonary effort is normal.  Skin:    Comments: Skin tear left shin is healed.  Some chronic venous stasis discoloration is noted bilaterally on the shins.    Neurological:     Mental Status: She is alert.      BP (!) 130/50   Pulse 76   Temp 98.1 F (36.7 C)   Resp 18   Ht $R'5\' 4"'vw$  (1.626 m)   Wt 184 lb 6.4 oz (83.6 kg)   SpO2 90%   BMI 31.65 kg/m  Wt Readings from Last 3 Encounters:  08/07/20 184 lb 6.4 oz (83.6 kg)  07/25/20 191 lb (86.6 kg)  07/25/20 191 lb 6.4 oz (86.8 kg)       Assessment & Plan:   Problem List Items Addressed This Visit        Unprioritized   Skin tear of left lower leg without complication    Nearly resolved.  LLE dressing was replaced with petrolatum dressing and coban wrap.         Bilateral lower extremity edema    Slightly improved today. Weight is down. Continue lasix with goal weight of 185.   Wt Readings from Last 3 Encounters:  08/07/20 184 lb 6.4 oz (83.6 kg)  07/25/20 191 lb (86.6 kg)  07/25/20 191 lb 6.4 oz (86.8 kg)        25 minutes spent on today's visit. The majority of this time was spent on wound care.   I have discontinued [name] "Sarahs"'s benzonatate. I am also having her maintain her acetaminophen, vitamin B-12, OXYGEN, b complex vitamins, Probiotic Product (PROBIOTIC PO), loratadine, Ferrous Fumarate-Folic Acid, trolamine salicylate, blood glucose meter kit and supplies, pantoprazole, docusate sodium, polyethylene glycol, montelukast, albuterol, sertraline, fluticasone, albuterol, albuterol, furosemide, Magnesium Oxide, pravastatin, Accu-Chek Aviva, dextromethorphan-guaiFENesin, doxycycline, Alcohol Wipes, Accu-Chek Softclix Lancets, Accu-Chek Aviva Plus, Accu-Chek Aviva Plus, allopurinol, Fluzone High-Dose Quadrivalent, budesonide-formoterol, mupirocin ointment, bacitracin, cephALEXin, traMADol, and metoprolol succinate.  No orders of the defined types were placed in this encounter.

## 2020-08-07 NOTE — Assessment & Plan Note (Signed)
Nearly resolved.  LLE dressing was replaced with petrolatum dressing and coban wrap.

## 2020-08-07 NOTE — Patient Instructions (Signed)
Call increased redness or if skin does not continue to improve.

## 2020-08-07 NOTE — Assessment & Plan Note (Signed)
Slightly improved today. Weight is down. Continue lasix with goal weight of 185.   Wt Readings from Last 3 Encounters:  08/07/20 184 lb 6.4 oz (83.6 kg)  07/25/20 191 lb (86.6 kg)  07/25/20 191 lb 6.4 oz (86.8 kg)

## 2020-08-10 DIAGNOSIS — I130 Hypertensive heart and chronic kidney disease with heart failure and stage 1 through stage 4 chronic kidney disease, or unspecified chronic kidney disease: Secondary | ICD-10-CM | POA: Diagnosis not present

## 2020-08-10 DIAGNOSIS — E1122 Type 2 diabetes mellitus with diabetic chronic kidney disease: Secondary | ICD-10-CM | POA: Diagnosis not present

## 2020-08-10 DIAGNOSIS — N184 Chronic kidney disease, stage 4 (severe): Secondary | ICD-10-CM | POA: Diagnosis not present

## 2020-08-10 DIAGNOSIS — D631 Anemia in chronic kidney disease: Secondary | ICD-10-CM | POA: Diagnosis not present

## 2020-08-10 DIAGNOSIS — J449 Chronic obstructive pulmonary disease, unspecified: Secondary | ICD-10-CM | POA: Diagnosis not present

## 2020-08-10 DIAGNOSIS — S51812D Laceration without foreign body of left forearm, subsequent encounter: Secondary | ICD-10-CM | POA: Diagnosis not present

## 2020-08-10 DIAGNOSIS — F32A Depression, unspecified: Secondary | ICD-10-CM | POA: Diagnosis not present

## 2020-08-10 DIAGNOSIS — I5042 Chronic combined systolic (congestive) and diastolic (congestive) heart failure: Secondary | ICD-10-CM | POA: Diagnosis not present

## 2020-08-10 DIAGNOSIS — S81812D Laceration without foreign body, left lower leg, subsequent encounter: Secondary | ICD-10-CM | POA: Diagnosis not present

## 2020-08-13 DIAGNOSIS — J4520 Mild intermittent asthma, uncomplicated: Secondary | ICD-10-CM | POA: Diagnosis not present

## 2020-08-13 DIAGNOSIS — R0902 Hypoxemia: Secondary | ICD-10-CM | POA: Diagnosis not present

## 2020-08-14 ENCOUNTER — Other Ambulatory Visit: Payer: Self-pay | Admitting: Internal Medicine

## 2020-08-14 DIAGNOSIS — J449 Chronic obstructive pulmonary disease, unspecified: Secondary | ICD-10-CM | POA: Diagnosis not present

## 2020-08-14 DIAGNOSIS — S81812D Laceration without foreign body, left lower leg, subsequent encounter: Secondary | ICD-10-CM | POA: Diagnosis not present

## 2020-08-14 DIAGNOSIS — I130 Hypertensive heart and chronic kidney disease with heart failure and stage 1 through stage 4 chronic kidney disease, or unspecified chronic kidney disease: Secondary | ICD-10-CM | POA: Diagnosis not present

## 2020-08-14 DIAGNOSIS — E1122 Type 2 diabetes mellitus with diabetic chronic kidney disease: Secondary | ICD-10-CM | POA: Diagnosis not present

## 2020-08-14 DIAGNOSIS — D631 Anemia in chronic kidney disease: Secondary | ICD-10-CM | POA: Diagnosis not present

## 2020-08-14 DIAGNOSIS — I5042 Chronic combined systolic (congestive) and diastolic (congestive) heart failure: Secondary | ICD-10-CM | POA: Diagnosis not present

## 2020-08-14 DIAGNOSIS — F32A Depression, unspecified: Secondary | ICD-10-CM | POA: Diagnosis not present

## 2020-08-14 DIAGNOSIS — S51812D Laceration without foreign body of left forearm, subsequent encounter: Secondary | ICD-10-CM | POA: Diagnosis not present

## 2020-08-14 DIAGNOSIS — N184 Chronic kidney disease, stage 4 (severe): Secondary | ICD-10-CM | POA: Diagnosis not present

## 2020-08-16 ENCOUNTER — Inpatient Hospital Stay: Payer: Medicare HMO | Attending: Family Medicine

## 2020-08-16 ENCOUNTER — Other Ambulatory Visit: Payer: Self-pay

## 2020-08-16 ENCOUNTER — Encounter: Payer: Self-pay | Admitting: Family

## 2020-08-16 ENCOUNTER — Inpatient Hospital Stay: Payer: Medicare HMO | Admitting: Family

## 2020-08-16 ENCOUNTER — Inpatient Hospital Stay: Payer: Medicare HMO

## 2020-08-16 VITALS — BP 102/51 | HR 82 | Temp 98.7°F | Resp 19 | Ht 64.0 in | Wt 183.1 lb

## 2020-08-16 DIAGNOSIS — N183 Chronic kidney disease, stage 3 unspecified: Secondary | ICD-10-CM | POA: Insufficient documentation

## 2020-08-16 DIAGNOSIS — Z79899 Other long term (current) drug therapy: Secondary | ICD-10-CM | POA: Insufficient documentation

## 2020-08-16 DIAGNOSIS — D631 Anemia in chronic kidney disease: Secondary | ICD-10-CM | POA: Diagnosis not present

## 2020-08-16 DIAGNOSIS — D509 Iron deficiency anemia, unspecified: Secondary | ICD-10-CM | POA: Diagnosis not present

## 2020-08-16 DIAGNOSIS — E611 Iron deficiency: Secondary | ICD-10-CM | POA: Insufficient documentation

## 2020-08-16 LAB — CMP (CANCER CENTER ONLY)
ALT: 9 U/L (ref 0–44)
AST: 13 U/L — ABNORMAL LOW (ref 15–41)
Albumin: 4.2 g/dL (ref 3.5–5.0)
Alkaline Phosphatase: 44 U/L (ref 38–126)
Anion gap: 5 (ref 5–15)
BUN: 34 mg/dL — ABNORMAL HIGH (ref 8–23)
CO2: 35 mmol/L — ABNORMAL HIGH (ref 22–32)
Calcium: 10.3 mg/dL (ref 8.9–10.3)
Chloride: 101 mmol/L (ref 98–111)
Creatinine: 1.64 mg/dL — ABNORMAL HIGH (ref 0.44–1.00)
GFR, Estimated: 32 mL/min — ABNORMAL LOW (ref >60)
Glucose, Bld: 91 mg/dL (ref 70–99)
Potassium: 4.9 mmol/L (ref 3.5–5.1)
Sodium: 141 mmol/L (ref 135–145)
Total Bilirubin: 0.4 mg/dL (ref 0.3–1.2)
Total Protein: 7 g/dL (ref 6.5–8.1)

## 2020-08-16 LAB — CBC WITH DIFFERENTIAL (CANCER CENTER ONLY)
Abs Immature Granulocytes: 0.07 10*3/uL (ref 0.00–0.07)
Basophils Absolute: 0 10*3/uL (ref 0.0–0.1)
Basophils Relative: 1 %
Eosinophils Absolute: 0.3 10*3/uL (ref 0.0–0.5)
Eosinophils Relative: 5 %
HCT: 35 % — ABNORMAL LOW (ref 36.0–46.0)
Hemoglobin: 10.7 g/dL — ABNORMAL LOW (ref 12.0–15.0)
Immature Granulocytes: 1 %
Lymphocytes Relative: 21 %
Lymphs Abs: 1.3 10*3/uL (ref 0.7–4.0)
MCH: 30.8 pg (ref 26.0–34.0)
MCHC: 30.6 g/dL (ref 30.0–36.0)
MCV: 100.9 fL — ABNORMAL HIGH (ref 80.0–100.0)
Monocytes Absolute: 0.7 10*3/uL (ref 0.1–1.0)
Monocytes Relative: 11 %
Neutro Abs: 3.8 10*3/uL (ref 1.7–7.7)
Neutrophils Relative %: 61 %
Platelet Count: 135 10*3/uL — ABNORMAL LOW (ref 150–400)
RBC: 3.47 MIL/uL — ABNORMAL LOW (ref 3.87–5.11)
RDW: 14.4 % (ref 11.5–15.5)
WBC Count: 6.3 10*3/uL (ref 4.0–10.5)
nRBC: 0 % (ref 0.0–0.2)

## 2020-08-16 LAB — IRON AND TIBC
Iron: 80 ug/dL (ref 41–142)
Saturation Ratios: 33 % (ref 21–57)
TIBC: 243 ug/dL (ref 236–444)
UIBC: 162 ug/dL (ref 120–384)

## 2020-08-16 LAB — RETICULOCYTES
Immature Retic Fract: 9.1 % (ref 2.3–15.9)
RBC.: 3.53 MIL/uL — ABNORMAL LOW (ref 3.87–5.11)
Retic Count, Absolute: 45.2 10*3/uL (ref 19.0–186.0)
Retic Ct Pct: 1.3 % (ref 0.4–3.1)

## 2020-08-16 LAB — FERRITIN: Ferritin: 406 ng/mL — ABNORMAL HIGH (ref 11–307)

## 2020-08-16 MED ORDER — CYANOCOBALAMIN 1000 MCG/ML IJ SOLN
INTRAMUSCULAR | Status: AC
  Filled 2020-08-16: qty 1

## 2020-08-16 MED ORDER — EPOETIN ALFA-EPBX 40000 UNIT/ML IJ SOLN
40000.0000 [IU] | Freq: Once | INTRAMUSCULAR | Status: AC
  Administered 2020-08-16: 40000 [IU] via SUBCUTANEOUS

## 2020-08-16 NOTE — Progress Notes (Signed)
Hematology and Oncology Follow Up Visit  [name] [id number] 24-Nov-1940 80 y.o. 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 is here today for follow-up. She is doing well but did stumble and fall recently while going outside to feed the hummingbirds. She has an abrasion on the front of her left shin that she states is healing nicely. No redness or drainage noted. Thankfully she was not more seriously injured.  She is ambulating with a Rolator for added support.   She is doing well on supplemental O2 and states that her SOB is minimal unless she goes out into the heat.  She has not noted any blood loss. No abnormal bruising, no petechiae.  No bruising or petechiae.  No fever, chills, n/v, cough, rash, chest pain, palpitations, abdominal pain or changes in bowel or bladder habits.  She has occasional episodes of dizziness.  She has chronic swelling in her lower extremities but states that this seems to be better with home health coming to wrap them during the week.  Pedal pulses are 2+.  No syncope.  She states that she has a good appetite and is doing her best to stay well hydrated. Her weight is stable at 183 lbs.   ECOG Performance Status: 1 - Symptomatic but completely ambulatory  Medications:  Allergies as of 08/16/2020       Reactions   Montelukast Sodium Palpitations, Other (See Comments)   REACTION: HEART PALPITATIONS, CHEST PAIN.   Sulfa Antibiotics Other (See Comments)   CHEST PAIN   Sulfonamide Derivatives Other (See Comments)   CHEST PAIN   Oseltamivir Phosphate Diarrhea, Nausea And Vomiting, Other (See Comments)   TAMIFLU REACTION:  dizziness   Ace Inhibitors Other (See Comments)   PT. STATED UNKNOWN REACTION   Indomethacin Other (See Comments)   PT. STATED UNKNOWN REACTION        Medication  List        Accurate as of August 16, 2020 10:54 AM. If you have any questions, ask your nurse or doctor.          STOP taking these medications    cephALEXin 500 MG capsule Commonly known as: KEFLEX Stopped by: Laverna Peace, NP   doxycycline 100 MG capsule Commonly known as: VIBRAMYCIN Stopped by: Laverna Peace, NP   Fluzone High-Dose Quadrivalent 0.7 ML Susy Generic drug: Influenza Vac High-Dose Quad Stopped by: Laverna Peace, NP       TAKE these medications    Accu-Chek Aviva Plus test strip Generic drug: glucose blood CHECK BLOOD SUGAR ONE TIME WEEKLY.  DX CODE: E11.9   Accu-Chek Aviva Plus w/Device Kit CHECK BLOOD SUGAR ONE TIME WEEKLY   Accu-Chek Aviva Soln USE AS DIRECTED   Accu-Chek Softclix Lancets lancets CHECK BLOOD SUGAR ONE TIME WEEKLY   acetaminophen 500 MG tablet Commonly known as: TYLENOL Take 500 mg by mouth 2 (two) times daily.   albuterol 108 (90 Base) MCG/ACT inhaler Commonly known as: VENTOLIN HFA Inhale 2 puffs into the lungs every 4 (four) hours as needed for wheezing or shortness of breath.   albuterol 0.63 MG/3ML nebulizer solution Commonly known as: ACCUNEB Take 3 mLs (0.63 mg total) by nebulization every 6 (six) hours as needed for wheezing or shortness of breath.   albuterol (2.5 MG/3ML) 0.083% nebulizer  solution Commonly known as: PROVENTIL   Alcohol Wipes 70 % Pads To use before checking blood sugars   allopurinol 100 MG tablet Commonly known as: ZYLOPRIM TAKE 2 TABLETS ONE TIME DAILY   b complex vitamins tablet Take 1 tablet by mouth daily.   bacitracin ointment Apply 1 application topically 2 (two) times daily.   blood glucose meter kit and supplies Check blood sugars once weekly.   budesonide-formoterol 160-4.5 MCG/ACT inhaler Commonly known as: Symbicort Inhale 2 puffs into the lungs in the morning and at bedtime.   dextromethorphan-guaiFENesin 30-600 MG 12hr tablet Commonly known as: MUCINEX  DM Take 1 tablet by mouth 2 (two) times daily.   docusate sodium 100 MG capsule Commonly known as: COLACE Take 1 capsule (100 mg total) by mouth 2 (two) times daily.   Ferrous Fumarate-Folic Acid 322-0 MG Tabs Commonly known as: Hemocyte-F Take 1 tablet by mouth daily.   fluticasone 50 MCG/ACT nasal spray Commonly known as: FLONASE Place 2 sprays into both nostrils daily.   furosemide 40 MG tablet Commonly known as: LASIX TAKE 1 TABLET DAILY AND A SECOND TABLET DAILY AS NEEDED FOR INCREASED EDEMA OR WEIGHT GAIN GREATER THAN 3 POUNDS IN 24 HOURS   loratadine 10 MG tablet Commonly known as: CLARITIN Take 1 tablet (10 mg total) by mouth at bedtime. What changed:  when to take this reasons to take this   Magnesium Oxide 250 MG Tabs Take 1 tablet (250 mg total) by mouth daily.   metoprolol succinate 50 MG 24 hr tablet Commonly known as: TOPROL-XL TAKE 1 AND 1/2 TABLETS EVERY DAY WITH OR IMMEDIATELY FOLLOWING A MEAL   montelukast 10 MG tablet Commonly known as: SINGULAIR Take 1 tablet (10 mg total) by mouth at bedtime.   mupirocin ointment 2 % Commonly known as: BACTROBAN Apply 1 application topically 2 (two) times daily.   OXYGEN Inhale 2 L into the lungs continuous.   pantoprazole 40 MG tablet Commonly known as: PROTONIX Take 40 mg by mouth 2 (two) times daily.   polyethylene glycol 17 g packet Commonly known as: MIRALAX / GLYCOLAX Take 17 g by mouth daily.   pravastatin 40 MG tablet Commonly known as: PRAVACHOL Take 1 tablet (40 mg total) by mouth daily.   PROBIOTIC PO Take 1 tablet by mouth daily.   sertraline 100 MG tablet Commonly known as: ZOLOFT Take 1 tablet (100 mg total) by mouth daily.   traMADol 50 MG tablet Commonly known as: ULTRAM TAKE 1 TABLET BY MOUTH THREE TIMES DAILY AS NEEDED FOR MODERATE PAIN   trolamine salicylate 10 % cream Commonly known as: ASPERCREME Apply 1 application topically as needed for muscle pain.   vitamin B-12 1000  MCG tablet Commonly known as: CYANOCOBALAMIN Take 1,000 mcg by mouth daily.        Allergies:  Allergies  Allergen Reactions   Montelukast Sodium Palpitations and Other (See Comments)    REACTION: HEART PALPITATIONS, CHEST PAIN.   Sulfa Antibiotics Other (See Comments)    CHEST PAIN   Sulfonamide Derivatives Other (See Comments)    CHEST PAIN   Oseltamivir Phosphate Diarrhea, Nausea And Vomiting and Other (See Comments)    TAMIFLU REACTION:  dizziness   Ace Inhibitors Other (See Comments)    PT. STATED UNKNOWN REACTION   Indomethacin Other (See Comments)    PT. STATED UNKNOWN REACTION    Past Medical History, Surgical history, Social history, and Family History were reviewed and updated.  Review of Systems: All other 10  point review of systems is negative.   Physical Exam:  height is $RemoveB'5\' 4"'[id number]$  (1.626 m) and weight is 183 lb 1.3 oz (83 kg). Her oral temperature is 98.7 F (37.1 C). Her blood pressure is 102/51 (abnormal) and her pulse is 82. Her respiration is 19 and oxygen saturation is 100%.   Wt Readings from Last 3 Encounters:  08/16/20 183 lb 1.3 oz (83 kg)  08/07/20 184 lb 6.4 oz (83.6 kg)  07/25/20 191 lb (86.6 kg)    Ocular: Sclerae unicteric, pupils equal, round and reactive to light Ear-nose-throat: Oropharynx clear, dentition fair Lymphatic: No cervical or supraclavicular adenopathy Lungs no rales or rhonchi, good excursion bilaterally Heart regular rate and rhythm, no murmur appreciated Abd soft, nontender, positive bowel sounds MSK no focal spinal tenderness, no joint edema Neuro: non-focal, well-oriented, appropriate affect Breasts: Deferred   Lab Results  Component Value Date   WBC 6.3 08/16/2020   HGB 10.7 (L) 08/16/2020   HCT 35.0 (L) 08/16/2020   MCV 100.9 (H) 08/16/2020   PLT 135 (L) 08/16/2020   Lab Results  Component Value Date   FERRITIN 643 (H) 06/25/2020   IRON 110 06/25/2020   TIBC 256 06/25/2020   UIBC 147 06/25/2020   IRONPCTSAT 43  06/25/2020   Lab Results  Component Value Date   RETICCTPCT 1.3 08/16/2020   RBC 3.53 (L) 08/16/2020   RBC 3.47 (L) 08/16/2020   No results found for: KPAFRELGTCHN, LAMBDASER, KAPLAMBRATIO No results found for: IGGSERUM, IGA, IGMSERUM No results found for: Odetta Pink, SPEI   Chemistry      Component Value Date/Time   NA 141 08/16/2020 1023   NA 143 01/27/2020 1535   K 4.9 08/16/2020 1023   CL 101 08/16/2020 1023   CO2 35 (H) 08/16/2020 1023   BUN 34 (H) 08/16/2020 1023   BUN 40 (H) 01/27/2020 1535   CREATININE 1.64 (H) 08/16/2020 1023   CREATININE 1.36 (H) 08/23/2013 1134      Component Value Date/Time   CALCIUM 10.3 08/16/2020 1023   ALKPHOS 44 08/16/2020 1023   AST 13 (L) 08/16/2020 1023   ALT 9 08/16/2020 1023   BILITOT 0.4 08/16/2020 1023       Impression and Plan: Ms. Joerger is a very pleasant 80 yo caucasian female with multifactorial anemia.  Iron studies are pending. We will replace if needed.  ESA given, Hgb 10.7.  Lab and injection in 3 weeks and follow-up in 6 weeks.  She can contact our office with any questions or concerns.   Laverna Peace, NP 7/28/202210:54 AM

## 2020-08-16 NOTE — Patient Instructions (Signed)
Epoetin Alfa injection What is this medication? EPOETIN ALFA (e POE e tin AL fa) helps your body make more red blood cells. This medicine is used to treat anemia caused by chronic kidney disease, cancer chemotherapy, or HIV-therapy. It may also be used before surgery if you have anemia. This medicine may be used for other purposes; ask your health care provider or pharmacist if you have questions. COMMON BRAND NAME(S): Epogen, Procrit, Retacrit What should I tell my care team before I take this medication? They need to know if you have any of these conditions: cancer heart disease high blood pressure history of blood clots history of stroke low levels of folate, iron, or vitamin B12 in the blood seizures an unusual or allergic reaction to erythropoietin, albumin, benzyl alcohol, hamster proteins, other medicines, foods, dyes, or preservatives pregnant or trying to get pregnant breast-feeding How should I use this medication? This medicine is for injection into a vein or under the skin. It is usually given by a health care professional in a hospital or clinic setting. If you get this medicine at home, you will be taught how to prepare and give this medicine. Use exactly as directed. Take your medicine at regular intervals. Do not take your medicine more often than directed. It is important that you put your used needles and syringes in a special sharps container. Do not put them in a trash can. If you do not have a sharps container, call your pharmacist or healthcare provider to get one. A special MedGuide will be given to you by the pharmacist with each prescription and refill. Be sure to read this information carefully each time. Talk to your pediatrician regarding the use of this medicine in children. While this drug may be prescribed for selected conditions, precautions do apply. Overdosage: If you think you have taken too much of this medicine contact a poison control center or emergency  room at once. NOTE: This medicine is only for you. Do not share this medicine with others. What if I miss a dose? If you miss a dose, take it as soon as you can. If it is almost time for your next dose, take only that dose. Do not take double or extra doses. What may interact with this medication? Interactions have not been studied. This list may not describe all possible interactions. Give your health care provider a list of all the medicines, herbs, non-prescription drugs, or dietary supplements you use. Also tell them if you smoke, drink alcohol, or use illegal drugs. Some items may interact with your medicine. What should I watch for while using this medication? Your condition will be monitored carefully while you are receiving this medicine. You may need blood work done while you are taking this medicine. This medicine may cause a decrease in vitamin B6. You should make sure that you get enough vitamin B6 while you are taking this medicine. Discuss the foods you eat and the vitamins you take with your health care professional. What side effects may I notice from receiving this medication? Side effects that you should report to your doctor or health care professional as soon as possible: allergic reactions like skin rash, itching or hives, swelling of the face, lips, or tongue seizures signs and symptoms of a blood clot such as breathing problems; changes in vision; chest pain; severe, sudden headache; pain, swelling, warmth in the leg; trouble speaking; sudden numbness or weakness of the face, arm or leg signs and symptoms of a stroke like   changes in vision; confusion; trouble speaking or understanding; severe headaches; sudden numbness or weakness of the face, arm or leg; trouble walking; dizziness; loss of balance or coordination Side effects that usually do not require medical attention (report to your doctor or health care professional if they continue or are  bothersome): chills cough dizziness fever headaches joint pain muscle cramps muscle pain nausea, vomiting pain, redness, or irritation at site where injected This list may not describe all possible side effects. Call your doctor for medical advice about side effects. You may report side effects to FDA at 1-800-FDA-1088. Where should I keep my medication? Keep out of the reach of children. Store in a refrigerator between 2 and 8 degrees C (36 and 46 degrees F). Do not freeze or shake. Throw away any unused portion if using a single-dose vial. Multi-dose vials can be kept in the refrigerator for up to 21 days after the initial dose. Throw away unused medicine. NOTE: This sheet is a summary. It may not cover all possible information. If you have questions about this medicine, talk to your doctor, pharmacist, or health care provider.  2022 Elsevier/Gold Standard (2016-08-15 08:35:19)  

## 2020-08-17 ENCOUNTER — Telehealth: Payer: Self-pay | Admitting: *Deleted

## 2020-08-17 NOTE — Telephone Encounter (Signed)
Per 08/17/20 los called and lvm of upcoming appointments - requested callback to confirm - mailed calendar

## 2020-08-21 DIAGNOSIS — I5042 Chronic combined systolic (congestive) and diastolic (congestive) heart failure: Secondary | ICD-10-CM | POA: Diagnosis not present

## 2020-08-21 DIAGNOSIS — J449 Chronic obstructive pulmonary disease, unspecified: Secondary | ICD-10-CM | POA: Diagnosis not present

## 2020-08-21 DIAGNOSIS — F32A Depression, unspecified: Secondary | ICD-10-CM | POA: Diagnosis not present

## 2020-08-21 DIAGNOSIS — D631 Anemia in chronic kidney disease: Secondary | ICD-10-CM | POA: Diagnosis not present

## 2020-08-21 DIAGNOSIS — N184 Chronic kidney disease, stage 4 (severe): Secondary | ICD-10-CM | POA: Diagnosis not present

## 2020-08-21 DIAGNOSIS — I130 Hypertensive heart and chronic kidney disease with heart failure and stage 1 through stage 4 chronic kidney disease, or unspecified chronic kidney disease: Secondary | ICD-10-CM | POA: Diagnosis not present

## 2020-08-21 DIAGNOSIS — E1122 Type 2 diabetes mellitus with diabetic chronic kidney disease: Secondary | ICD-10-CM | POA: Diagnosis not present

## 2020-08-21 DIAGNOSIS — S51812D Laceration without foreign body of left forearm, subsequent encounter: Secondary | ICD-10-CM | POA: Diagnosis not present

## 2020-08-21 DIAGNOSIS — S81812D Laceration without foreign body, left lower leg, subsequent encounter: Secondary | ICD-10-CM | POA: Diagnosis not present

## 2020-08-27 ENCOUNTER — Other Ambulatory Visit: Payer: Self-pay

## 2020-08-27 NOTE — Progress Notes (Signed)
Pt is aware and will call Dr. Melvyn Novas

## 2020-08-27 NOTE — Progress Notes (Signed)
LVM to ask if she had an allergy to Montelukast Sodium or SINGULAIR because that is why the medication may be getting rejected.

## 2020-09-03 ENCOUNTER — Other Ambulatory Visit: Payer: Self-pay | Admitting: Family Medicine

## 2020-09-04 NOTE — Telephone Encounter (Signed)
Patient is requesting a refill of the following medications: Requested Prescriptions   Pending Prescriptions Disp Refills   traMADol (ULTRAM) 50 MG tablet [Pharmacy Med Name: traMADol HCl 50 MG Oral Tablet] 90 tablet 0    Sig: TAKE 1 TABLET BY MOUTH THREE TIMES DAILY AS NEEDED FOR MODERATE PAIN    Date of patient request: 09/03/20 Last office visit: 08/07/20 w/ MO Date of last refill: 07/29/20  Last refill amount: 90 + 0 Follow up time period per chart: 11/12/20 w/ Charlett Blake

## 2020-09-07 ENCOUNTER — Inpatient Hospital Stay: Payer: Medicare HMO | Attending: Family Medicine

## 2020-09-07 ENCOUNTER — Other Ambulatory Visit: Payer: Self-pay

## 2020-09-07 ENCOUNTER — Inpatient Hospital Stay: Payer: Medicare HMO

## 2020-09-07 VITALS — BP 118/80 | HR 82 | Temp 98.0°F | Resp 19

## 2020-09-07 DIAGNOSIS — N183 Chronic kidney disease, stage 3 unspecified: Secondary | ICD-10-CM | POA: Diagnosis not present

## 2020-09-07 DIAGNOSIS — D509 Iron deficiency anemia, unspecified: Secondary | ICD-10-CM | POA: Diagnosis not present

## 2020-09-07 DIAGNOSIS — Z79899 Other long term (current) drug therapy: Secondary | ICD-10-CM | POA: Diagnosis not present

## 2020-09-07 DIAGNOSIS — D631 Anemia in chronic kidney disease: Secondary | ICD-10-CM

## 2020-09-07 LAB — CBC WITH DIFFERENTIAL (CANCER CENTER ONLY)
Abs Immature Granulocytes: 0.01 10*3/uL (ref 0.00–0.07)
Basophils Absolute: 0 10*3/uL (ref 0.0–0.1)
Basophils Relative: 0 %
Eosinophils Absolute: 0.2 10*3/uL (ref 0.0–0.5)
Eosinophils Relative: 5 %
HCT: 34.4 % — ABNORMAL LOW (ref 36.0–46.0)
Hemoglobin: 10.8 g/dL — ABNORMAL LOW (ref 12.0–15.0)
Immature Granulocytes: 0 %
Lymphocytes Relative: 24 %
Lymphs Abs: 1.1 10*3/uL (ref 0.7–4.0)
MCH: 31.2 pg (ref 26.0–34.0)
MCHC: 31.4 g/dL (ref 30.0–36.0)
MCV: 99.4 fL (ref 80.0–100.0)
Monocytes Absolute: 0.5 10*3/uL (ref 0.1–1.0)
Monocytes Relative: 11 %
Neutro Abs: 2.8 10*3/uL (ref 1.7–7.7)
Neutrophils Relative %: 60 %
Platelet Count: 110 10*3/uL — ABNORMAL LOW (ref 150–400)
RBC: 3.46 MIL/uL — ABNORMAL LOW (ref 3.87–5.11)
RDW: 14.5 % (ref 11.5–15.5)
WBC Count: 4.7 10*3/uL (ref 4.0–10.5)
nRBC: 0 % (ref 0.0–0.2)

## 2020-09-07 LAB — CMP (CANCER CENTER ONLY)
ALT: 7 U/L (ref 0–44)
AST: 12 U/L — ABNORMAL LOW (ref 15–41)
Albumin: 4 g/dL (ref 3.5–5.0)
Alkaline Phosphatase: 45 U/L (ref 38–126)
Anion gap: 6 (ref 5–15)
BUN: 41 mg/dL — ABNORMAL HIGH (ref 8–23)
CO2: 35 mmol/L — ABNORMAL HIGH (ref 22–32)
Calcium: 9.7 mg/dL (ref 8.9–10.3)
Chloride: 101 mmol/L (ref 98–111)
Creatinine: 1.82 mg/dL — ABNORMAL HIGH (ref 0.44–1.00)
GFR, Estimated: 28 mL/min — ABNORMAL LOW (ref >60)
Glucose, Bld: 112 mg/dL — ABNORMAL HIGH (ref 70–99)
Potassium: 4.3 mmol/L (ref 3.5–5.1)
Sodium: 142 mmol/L (ref 135–145)
Total Bilirubin: 0.6 mg/dL (ref 0.3–1.2)
Total Protein: 6.8 g/dL (ref 6.5–8.1)

## 2020-09-07 MED ORDER — EPOETIN ALFA-EPBX 40000 UNIT/ML IJ SOLN
40000.0000 [IU] | Freq: Once | INTRAMUSCULAR | Status: AC
  Administered 2020-09-07: 40000 [IU] via SUBCUTANEOUS
  Filled 2020-09-07: qty 1

## 2020-09-07 NOTE — Patient Instructions (Signed)
Epoetin Alfa injection What is this medication? EPOETIN ALFA (e POE e tin AL fa) helps your body make more red blood cells. This medicine is used to treat anemia caused by chronic kidney disease, cancer chemotherapy, or HIV-therapy. It may also be used before surgery if you have anemia. This medicine may be used for other purposes; ask your health care provider or pharmacist if you have questions. COMMON BRAND NAME(S): Epogen, Procrit, Retacrit What should I tell my care team before I take this medication? They need to know if you have any of these conditions: cancer heart disease high blood pressure history of blood clots history of stroke low levels of folate, iron, or vitamin B12 in the blood seizures an unusual or allergic reaction to erythropoietin, albumin, benzyl alcohol, hamster proteins, other medicines, foods, dyes, or preservatives pregnant or trying to get pregnant breast-feeding How should I use this medication? This medicine is for injection into a vein or under the skin. It is usually given by a health care professional in a hospital or clinic setting. If you get this medicine at home, you will be taught how to prepare and give this medicine. Use exactly as directed. Take your medicine at regular intervals. Do not take your medicine more often than directed. It is important that you put your used needles and syringes in a special sharps container. Do not put them in a trash can. If you do not have a sharps container, call your pharmacist or healthcare provider to get one. A special MedGuide will be given to you by the pharmacist with each prescription and refill. Be sure to read this information carefully each time. Talk to your pediatrician regarding the use of this medicine in children. While this drug may be prescribed for selected conditions, precautions do apply. Overdosage: If you think you have taken too much of this medicine contact a poison control center or emergency  room at once. NOTE: This medicine is only for you. Do not share this medicine with others. What if I miss a dose? If you miss a dose, take it as soon as you can. If it is almost time for your next dose, take only that dose. Do not take double or extra doses. What may interact with this medication? Interactions have not been studied. This list may not describe all possible interactions. Give your health care provider a list of all the medicines, herbs, non-prescription drugs, or dietary supplements you use. Also tell them if you smoke, drink alcohol, or use illegal drugs. Some items may interact with your medicine. What should I watch for while using this medication? Your condition will be monitored carefully while you are receiving this medicine. You may need blood work done while you are taking this medicine. This medicine may cause a decrease in vitamin B6. You should make sure that you get enough vitamin B6 while you are taking this medicine. Discuss the foods you eat and the vitamins you take with your health care professional. What side effects may I notice from receiving this medication? Side effects that you should report to your doctor or health care professional as soon as possible: allergic reactions like skin rash, itching or hives, swelling of the face, lips, or tongue seizures signs and symptoms of a blood clot such as breathing problems; changes in vision; chest pain; severe, sudden headache; pain, swelling, warmth in the leg; trouble speaking; sudden numbness or weakness of the face, arm or leg signs and symptoms of a stroke like   changes in vision; confusion; trouble speaking or understanding; severe headaches; sudden numbness or weakness of the face, arm or leg; trouble walking; dizziness; loss of balance or coordination Side effects that usually do not require medical attention (report to your doctor or health care professional if they continue or are  bothersome): chills cough dizziness fever headaches joint pain muscle cramps muscle pain nausea, vomiting pain, redness, or irritation at site where injected This list may not describe all possible side effects. Call your doctor for medical advice about side effects. You may report side effects to FDA at 1-800-FDA-1088. Where should I keep my medication? Keep out of the reach of children. Store in a refrigerator between 2 and 8 degrees C (36 and 46 degrees F). Do not freeze or shake. Throw away any unused portion if using a single-dose vial. Multi-dose vials can be kept in the refrigerator for up to 21 days after the initial dose. Throw away unused medicine. NOTE: This sheet is a summary. It may not cover all possible information. If you have questions about this medicine, talk to your doctor, pharmacist, or health care provider.  2022 Elsevier/Gold Standard (2016-08-15 08:35:19)  

## 2020-09-11 ENCOUNTER — Encounter (HOSPITAL_BASED_OUTPATIENT_CLINIC_OR_DEPARTMENT_OTHER): Payer: Self-pay

## 2020-09-11 ENCOUNTER — Other Ambulatory Visit: Payer: Self-pay

## 2020-09-11 ENCOUNTER — Ambulatory Visit (HOSPITAL_BASED_OUTPATIENT_CLINIC_OR_DEPARTMENT_OTHER)
Admission: RE | Admit: 2020-09-11 | Discharge: 2020-09-11 | Disposition: A | Discharge status: Home / Self Care | Payer: Medicare HMO | Source: Ambulatory Visit | Attending: Family Medicine | Admitting: Family Medicine

## 2020-09-11 DIAGNOSIS — M810 Age-related osteoporosis without current pathological fracture: Secondary | ICD-10-CM | POA: Diagnosis not present

## 2020-09-11 DIAGNOSIS — Z1231 Encounter for screening mammogram for malignant neoplasm of breast: Secondary | ICD-10-CM | POA: Diagnosis not present

## 2020-09-11 DIAGNOSIS — Z780 Asymptomatic menopausal state: Secondary | ICD-10-CM

## 2020-09-13 DIAGNOSIS — J4520 Mild intermittent asthma, uncomplicated: Secondary | ICD-10-CM | POA: Diagnosis not present

## 2020-09-13 DIAGNOSIS — R0902 Hypoxemia: Secondary | ICD-10-CM | POA: Diagnosis not present

## 2020-09-26 ENCOUNTER — Inpatient Hospital Stay: Payer: Medicare HMO

## 2020-09-26 ENCOUNTER — Other Ambulatory Visit: Payer: Self-pay

## 2020-09-26 ENCOUNTER — Inpatient Hospital Stay: Payer: Medicare HMO | Attending: Family Medicine

## 2020-09-26 ENCOUNTER — Encounter: Payer: Self-pay | Admitting: Family

## 2020-09-26 ENCOUNTER — Inpatient Hospital Stay: Payer: Medicare HMO | Admitting: Family

## 2020-09-26 VITALS — BP 128/47 | HR 75 | Temp 98.3°F | Resp 17 | Ht 64.0 in | Wt 181.0 lb

## 2020-09-26 DIAGNOSIS — D509 Iron deficiency anemia, unspecified: Secondary | ICD-10-CM

## 2020-09-26 DIAGNOSIS — D631 Anemia in chronic kidney disease: Secondary | ICD-10-CM | POA: Insufficient documentation

## 2020-09-26 DIAGNOSIS — R5383 Other fatigue: Secondary | ICD-10-CM | POA: Diagnosis not present

## 2020-09-26 DIAGNOSIS — R609 Edema, unspecified: Secondary | ICD-10-CM | POA: Insufficient documentation

## 2020-09-26 DIAGNOSIS — Z79899 Other long term (current) drug therapy: Secondary | ICD-10-CM | POA: Insufficient documentation

## 2020-09-26 DIAGNOSIS — E611 Iron deficiency: Secondary | ICD-10-CM | POA: Diagnosis not present

## 2020-09-26 DIAGNOSIS — N183 Chronic kidney disease, stage 3 unspecified: Secondary | ICD-10-CM | POA: Insufficient documentation

## 2020-09-26 LAB — CBC WITH DIFFERENTIAL (CANCER CENTER ONLY)
Abs Immature Granulocytes: 0.04 10*3/uL (ref 0.00–0.07)
Basophils Absolute: 0 10*3/uL (ref 0.0–0.1)
Basophils Relative: 1 %
Eosinophils Absolute: 0.3 10*3/uL (ref 0.0–0.5)
Eosinophils Relative: 5 %
HCT: 37.3 % (ref 36.0–46.0)
Hemoglobin: 11.6 g/dL — ABNORMAL LOW (ref 12.0–15.0)
Immature Granulocytes: 1 %
Lymphocytes Relative: 22 %
Lymphs Abs: 1.1 10*3/uL (ref 0.7–4.0)
MCH: 30.9 pg (ref 26.0–34.0)
MCHC: 31.1 g/dL (ref 30.0–36.0)
MCV: 99.2 fL (ref 80.0–100.0)
Monocytes Absolute: 0.5 10*3/uL (ref 0.1–1.0)
Monocytes Relative: 10 %
Neutro Abs: 3.1 10*3/uL (ref 1.7–7.7)
Neutrophils Relative %: 61 %
Platelet Count: 107 10*3/uL — ABNORMAL LOW (ref 150–400)
RBC: 3.76 MIL/uL — ABNORMAL LOW (ref 3.87–5.11)
RDW: 14.8 % (ref 11.5–15.5)
WBC Count: 5.1 10*3/uL (ref 4.0–10.5)
nRBC: 0 % (ref 0.0–0.2)

## 2020-09-26 LAB — CMP (CANCER CENTER ONLY)
ALT: 8 U/L (ref 0–44)
AST: 12 U/L — ABNORMAL LOW (ref 15–41)
Albumin: 4.1 g/dL (ref 3.5–5.0)
Alkaline Phosphatase: 47 U/L (ref 38–126)
Anion gap: 8 (ref 5–15)
BUN: 44 mg/dL — ABNORMAL HIGH (ref 8–23)
CO2: 37 mmol/L — ABNORMAL HIGH (ref 22–32)
Calcium: 9.8 mg/dL (ref 8.9–10.3)
Chloride: 98 mmol/L (ref 98–111)
Creatinine: 1.6 mg/dL — ABNORMAL HIGH (ref 0.44–1.00)
GFR, Estimated: 33 mL/min — ABNORMAL LOW (ref >60)
Glucose, Bld: 95 mg/dL (ref 70–99)
Potassium: 4.4 mmol/L (ref 3.5–5.1)
Sodium: 143 mmol/L (ref 135–145)
Total Bilirubin: 0.9 mg/dL (ref 0.3–1.2)
Total Protein: 6.6 g/dL (ref 6.5–8.1)

## 2020-09-26 LAB — RETICULOCYTES
Immature Retic Fract: 6.5 % (ref 2.3–15.9)
RBC.: 3.76 MIL/uL — ABNORMAL LOW (ref 3.87–5.11)
Retic Count, Absolute: 37.6 10*3/uL (ref 19.0–186.0)
Retic Ct Pct: 1 % (ref 0.4–3.1)

## 2020-09-26 NOTE — Progress Notes (Signed)
Hematology and Oncology Follow Up Visit  [name] [id number] 10/11/1940 80 y.o. 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 Hgb is improved at 11.6, No ESA.   No blood loss noted. No bruising or petechiae.  No fever, chills, n/v, cough, rash, chest pain, palpitations, abdominal pain or changes in bowel or bladder habits.  She has a lesion on the top of her right ear and red flaky spot on the right side of her nose. She plans to follow up with her dermatologist in [place] for further evaluation and treatment.  She is doing well on 1-2 L  supplemental O2. She is resting well at night but still has some mild fatigue at times. She feels that her SOB stable.  Chronic swelling in her lower extremities is unchanged from baseline. Her pedal pulses are 2+.  She has occasional lightheadedness and is careful when getting up and down. No new falls to report. No syncope. She is ambulating with her Rolator.  She has maintained a good appetite and is staying well hydrated. Her weight is stable at 181 lbs.    ECOG Performance Status: 1 - Symptomatic but completely ambulatory  Medications:  Allergies as of 09/26/2020       Reactions   Montelukast Sodium Palpitations, Other (See Comments)   REACTION: HEART PALPITATIONS, CHEST PAIN.   Sulfa Antibiotics Other (See Comments)   CHEST PAIN   Sulfonamide Derivatives Other (See Comments)   CHEST PAIN   Ace Inhibitors Other (See Comments)   PT. STATED UNKNOWN REACTION   Indomethacin Other (See Comments)   PT. STATED UNKNOWN REACTION        Medication List        Accurate as of September 26, 2020 10:58 PM. If you have any questions, ask your nurse or doctor.          Accu-Chek Aviva Plus test strip Generic drug: glucose blood CHECK BLOOD SUGAR  ONE TIME WEEKLY.  DX CODE: E11.9   Accu-Chek Aviva Plus w/Device Kit CHECK BLOOD SUGAR ONE TIME WEEKLY   Accu-Chek Aviva Soln USE AS DIRECTED   Accu-Chek Softclix Lancets lancets CHECK BLOOD SUGAR ONE TIME WEEKLY   acetaminophen 500 MG tablet Commonly known as: TYLENOL Take 500 mg by mouth 2 (two) times daily.   albuterol 108 (90 Base) MCG/ACT inhaler Commonly known as: VENTOLIN HFA Inhale 2 puffs into the lungs every 4 (four) hours as needed for wheezing or shortness of breath.   albuterol 0.63 MG/3ML nebulizer solution Commonly known as: ACCUNEB Take 3 mLs (0.63 mg total) by nebulization every 6 (six) hours as needed for wheezing or shortness of breath.   albuterol (2.5 MG/3ML) 0.083% nebulizer solution Commonly known as: PROVENTIL   Alcohol Wipes 70 % Pads To use before checking blood sugars   allopurinol 100 MG tablet Commonly known as: ZYLOPRIM TAKE 2 TABLETS ONE TIME DAILY   b complex vitamins tablet Take 1 tablet by mouth daily.   bacitracin ointment Apply 1 application topically 2 (two) times daily.   blood glucose meter kit and supplies Check blood sugars once weekly.   budesonide-formoterol 160-4.5 MCG/ACT inhaler Commonly known as: Symbicort Inhale 2 puffs into the lungs in the morning and at bedtime.   dextromethorphan-guaiFENesin 30-600 MG 12hr  tablet Commonly known as: MUCINEX DM Take 1 tablet by mouth 2 (two) times daily.   docusate sodium 100 MG capsule Commonly known as: COLACE Take 1 capsule (100 mg total) by mouth 2 (two) times daily.   Ferrous Fumarate-Folic Acid 253-6 MG Tabs Commonly known as: Hemocyte-F Take 1 tablet by mouth daily.   fluticasone 50 MCG/ACT nasal spray Commonly known as: FLONASE Place 2 sprays into both nostrils daily.   furosemide 40 MG tablet Commonly known as: LASIX TAKE 1 TABLET DAILY AND A SECOND TABLET DAILY AS NEEDED FOR INCREASED EDEMA OR WEIGHT GAIN GREATER THAN 3 POUNDS IN 24 HOURS   loratadine 10 MG  tablet Commonly known as: CLARITIN Take 1 tablet (10 mg total) by mouth at bedtime. What changed:  when to take this reasons to take this   Magnesium Oxide 250 MG Tabs Take 1 tablet (250 mg total) by mouth daily.   metoprolol succinate 50 MG 24 hr tablet Commonly known as: TOPROL-XL TAKE 1 AND 1/2 TABLETS EVERY DAY WITH OR IMMEDIATELY FOLLOWING A MEAL   montelukast 10 MG tablet Commonly known as: SINGULAIR Take 1 tablet (10 mg total) by mouth at bedtime.   mupirocin ointment 2 % Commonly known as: BACTROBAN Apply 1 application topically 2 (two) times daily.   OXYGEN Inhale 2 L into the lungs continuous.   pantoprazole 40 MG tablet Commonly known as: PROTONIX Take 40 mg by mouth 2 (two) times daily.   polyethylene glycol 17 g packet Commonly known as: MIRALAX / GLYCOLAX Take 17 g by mouth daily.   pravastatin 40 MG tablet Commonly known as: PRAVACHOL Take 1 tablet (40 mg total) by mouth daily.   PROBIOTIC PO Take 1 tablet by mouth daily.   sertraline 100 MG tablet Commonly known as: ZOLOFT Take 1 tablet (100 mg total) by mouth daily.   traMADol 50 MG tablet Commonly known as: ULTRAM TAKE 1 TABLET BY MOUTH THREE TIMES DAILY AS NEEDED FOR MODERATE PAIN   trolamine salicylate 10 % cream Commonly known as: ASPERCREME Apply 1 application topically as needed for muscle pain.   vitamin B-12 1000 MCG tablet Commonly known as: CYANOCOBALAMIN Take 1,000 mcg by mouth daily.        Allergies:  Allergies  Allergen Reactions   Montelukast Sodium Palpitations and Other (See Comments)    REACTION: HEART PALPITATIONS, CHEST PAIN.   Sulfa Antibiotics Other (See Comments)    CHEST PAIN   Sulfonamide Derivatives Other (See Comments)    CHEST PAIN   Ace Inhibitors Other (See Comments)    PT. STATED UNKNOWN REACTION   Indomethacin Other (See Comments)    PT. STATED UNKNOWN REACTION    Past Medical History, Surgical history, Social history, and Family History were  reviewed and updated.  Review of Systems: All other 10 point review of systems is negative.   Physical Exam:  height is _0  (1.626 m) and weight is 181 lb (82.1 kg). Her oral temperature is 98.3 F (36.8 C). Her blood pressure is 128/47 (abnormal) and her pulse is 75. Her respiration is 17 and oxygen saturation is 95%.   Wt Readings from Last 3 Encounters:  09/26/20 181 lb (82.1 kg)  08/16/20 183 lb 1.3 oz (83 kg)  08/07/20 184 lb 6.4 oz (83.6 kg)    Ocular: Sclerae unicteric, pupils equal, round and reactive to light Ear-nose-throat: Oropharynx clear, dentition fair Lymphatic: No cervical or supraclavicular adenopathy Lungs no rales or rhonchi, good excursion bilaterally Heart regular rate and  rhythm, no murmur appreciated Abd soft, nontender, positive bowel sounds MSK no focal spinal tenderness, no joint edema Neuro: non-focal, well-oriented, appropriate affect Breasts: Deferred   Lab Results  Component Value Date   WBC 5.1 09/26/2020   HGB 11.6 (L) 09/26/2020   HCT 37.3 09/26/2020   MCV 99.2 09/26/2020   PLT 107 (L) 09/26/2020   Lab Results  Component Value Date   FERRITIN 406 (H) 08/16/2020   IRON 80 08/16/2020   TIBC 243 08/16/2020   UIBC 162 08/16/2020   IRONPCTSAT 33 08/16/2020   Lab Results  Component Value Date   RETICCTPCT 1.0 09/26/2020   RBC 3.76 (L) 09/26/2020   RBC 3.76 (L) 09/26/2020   No results found for: KPAFRELGTCHN, LAMBDASER, KAPLAMBRATIO No results found for: IGGSERUM, IGA, IGMSERUM No results found for: Odetta Pink, SPEI   Chemistry      Component Value Date/Time   NA 143 09/26/2020 1426   NA 143 01/27/2020 1535   K 4.4 09/26/2020 1426   CL 98 09/26/2020 1426   CO2 37 (H) 09/26/2020 1426   BUN 44 (H) 09/26/2020 1426   BUN 40 (H) 01/27/2020 1535   CREATININE 1.60 (H) 09/26/2020 1426   CREATININE 1.36 (H) 08/23/2013 1134      Component Value Date/Time   CALCIUM 9.8 09/26/2020  1426   ALKPHOS 47 09/26/2020 1426   AST 12 (L) 09/26/2020 1426   ALT 8 09/26/2020 1426   BILITOT 0.9 09/26/2020 1426       Impression and Plan: [name] is a very pleasant 80 yo caucasian female with multifactorial anemia.  Iron studies are pending. We will replace if needed.  No ESA  needed, Hgb is 11.6.  Lab and injection in 3 weeks, follow-up with lab and injection in 6 weeks.  She can contact our office with any questions or concerns.   Laverna Peace, NP 9/7/202210:58 PM

## 2020-09-27 ENCOUNTER — Telehealth: Payer: Self-pay | Admitting: *Deleted

## 2020-09-27 LAB — IRON AND TIBC
Iron: 131 ug/dL (ref 41–142)
Saturation Ratios: 53 % (ref 21–57)
TIBC: 246 ug/dL (ref 236–444)
UIBC: 115 ug/dL — ABNORMAL LOW (ref 120–384)

## 2020-09-27 LAB — FERRITIN: Ferritin: 424 ng/mL — ABNORMAL HIGH (ref 11–307)

## 2020-09-27 NOTE — Telephone Encounter (Signed)
Per 09/26/20 los - called and gave upcoming appointments - confirmed - mailed calendar 

## 2020-10-01 ENCOUNTER — Telehealth: Payer: Self-pay | Admitting: *Deleted

## 2020-10-03 DIAGNOSIS — I129 Hypertensive chronic kidney disease with stage 1 through stage 4 chronic kidney disease, or unspecified chronic kidney disease: Secondary | ICD-10-CM | POA: Diagnosis not present

## 2020-10-03 DIAGNOSIS — D631 Anemia in chronic kidney disease: Secondary | ICD-10-CM | POA: Diagnosis not present

## 2020-10-03 DIAGNOSIS — E1122 Type 2 diabetes mellitus with diabetic chronic kidney disease: Secondary | ICD-10-CM | POA: Diagnosis not present

## 2020-10-03 DIAGNOSIS — Z23 Encounter for immunization: Secondary | ICD-10-CM | POA: Diagnosis not present

## 2020-10-03 DIAGNOSIS — N184 Chronic kidney disease, stage 4 (severe): Secondary | ICD-10-CM | POA: Diagnosis not present

## 2020-10-05 ENCOUNTER — Telehealth: Payer: Self-pay | Admitting: Family Medicine

## 2020-10-05 NOTE — Telephone Encounter (Signed)
Pt. Wants to make sure its okay to start her Prolia shots before her 10/24 appointment. She was informed she would need them but wants to double check she can start scheduling for them.

## 2020-10-08 ENCOUNTER — Ambulatory Visit: Payer: Medicare HMO | Admitting: Family Medicine

## 2020-10-08 NOTE — Telephone Encounter (Signed)
Clinical info submitted to insurance company. Waiting on summary of benefits to determine coverage. Will call patient to discuss once received.  

## 2020-10-14 DIAGNOSIS — J4520 Mild intermittent asthma, uncomplicated: Secondary | ICD-10-CM | POA: Diagnosis not present

## 2020-10-14 DIAGNOSIS — R0902 Hypoxemia: Secondary | ICD-10-CM | POA: Diagnosis not present

## 2020-10-17 ENCOUNTER — Inpatient Hospital Stay: Payer: Medicare HMO

## 2020-10-17 ENCOUNTER — Other Ambulatory Visit: Payer: Self-pay

## 2020-10-17 VITALS — BP 135/56 | HR 69 | Resp 19

## 2020-10-17 DIAGNOSIS — N183 Chronic kidney disease, stage 3 unspecified: Secondary | ICD-10-CM | POA: Diagnosis not present

## 2020-10-17 DIAGNOSIS — D509 Iron deficiency anemia, unspecified: Secondary | ICD-10-CM

## 2020-10-17 DIAGNOSIS — D631 Anemia in chronic kidney disease: Secondary | ICD-10-CM

## 2020-10-17 DIAGNOSIS — E611 Iron deficiency: Secondary | ICD-10-CM | POA: Diagnosis not present

## 2020-10-17 DIAGNOSIS — R5383 Other fatigue: Secondary | ICD-10-CM | POA: Diagnosis not present

## 2020-10-17 DIAGNOSIS — Z79899 Other long term (current) drug therapy: Secondary | ICD-10-CM | POA: Diagnosis not present

## 2020-10-17 DIAGNOSIS — R609 Edema, unspecified: Secondary | ICD-10-CM | POA: Diagnosis not present

## 2020-10-17 LAB — CBC WITH DIFFERENTIAL (CANCER CENTER ONLY)
Abs Immature Granulocytes: 0.02 10*3/uL (ref 0.00–0.07)
Basophils Absolute: 0 10*3/uL (ref 0.0–0.1)
Basophils Relative: 0 %
Eosinophils Absolute: 0.2 10*3/uL (ref 0.0–0.5)
Eosinophils Relative: 4 %
HCT: 33 % — ABNORMAL LOW (ref 36.0–46.0)
Hemoglobin: 10.5 g/dL — ABNORMAL LOW (ref 12.0–15.0)
Immature Granulocytes: 0 %
Lymphocytes Relative: 20 %
Lymphs Abs: 1.1 10*3/uL (ref 0.7–4.0)
MCH: 31.4 pg (ref 26.0–34.0)
MCHC: 31.8 g/dL (ref 30.0–36.0)
MCV: 98.8 fL (ref 80.0–100.0)
Monocytes Absolute: 0.6 10*3/uL (ref 0.1–1.0)
Monocytes Relative: 11 %
Neutro Abs: 3.7 10*3/uL (ref 1.7–7.7)
Neutrophils Relative %: 65 %
Platelet Count: 105 10*3/uL — ABNORMAL LOW (ref 150–400)
RBC: 3.34 MIL/uL — ABNORMAL LOW (ref 3.87–5.11)
RDW: 14.7 % (ref 11.5–15.5)
WBC Count: 5.7 10*3/uL (ref 4.0–10.5)
nRBC: 0 % (ref 0.0–0.2)

## 2020-10-17 LAB — CMP (CANCER CENTER ONLY)
ALT: 10 U/L (ref 0–44)
AST: 14 U/L — ABNORMAL LOW (ref 15–41)
Albumin: 4 g/dL (ref 3.5–5.0)
Alkaline Phosphatase: 38 U/L (ref 38–126)
Anion gap: 6 (ref 5–15)
BUN: 39 mg/dL — ABNORMAL HIGH (ref 8–23)
CO2: 34 mmol/L — ABNORMAL HIGH (ref 22–32)
Calcium: 9.7 mg/dL (ref 8.9–10.3)
Chloride: 102 mmol/L (ref 98–111)
Creatinine: 1.47 mg/dL — ABNORMAL HIGH (ref 0.44–1.00)
GFR, Estimated: 36 mL/min — ABNORMAL LOW (ref >60)
Glucose, Bld: 98 mg/dL (ref 70–99)
Potassium: 4.9 mmol/L (ref 3.5–5.1)
Sodium: 142 mmol/L (ref 135–145)
Total Bilirubin: 0.6 mg/dL (ref 0.3–1.2)
Total Protein: 6.5 g/dL (ref 6.5–8.1)

## 2020-10-17 MED ORDER — EPOETIN ALFA-EPBX 40000 UNIT/ML IJ SOLN
40000.0000 [IU] | Freq: Once | INTRAMUSCULAR | Status: AC
  Administered 2020-10-17: 40000 [IU] via SUBCUTANEOUS
  Filled 2020-10-17: qty 1

## 2020-10-17 NOTE — Patient Instructions (Signed)

## 2020-10-18 ENCOUNTER — Other Ambulatory Visit: Payer: Self-pay | Admitting: Family Medicine

## 2020-10-19 ENCOUNTER — Other Ambulatory Visit: Payer: Self-pay | Admitting: Family Medicine

## 2020-10-19 NOTE — Telephone Encounter (Signed)
Requesting: tramadol 50mg  Contract: 12/23/2016 UDS: None Last Visit: 08/07/20 w/ Lenna Sciara Next Visit: 11/12/2020 Last Refill: 09/04/2020 #90 and 0RF  Please Advise

## 2020-10-31 ENCOUNTER — Other Ambulatory Visit: Payer: Self-pay | Admitting: *Deleted

## 2020-10-31 MED ORDER — PRAVASTATIN SODIUM 40 MG PO TABS
40.0000 mg | ORAL_TABLET | Freq: Every day | ORAL | 1 refills | Status: DC
Start: 2020-10-31 — End: 2021-03-21

## 2020-11-02 ENCOUNTER — Other Ambulatory Visit (HOSPITAL_BASED_OUTPATIENT_CLINIC_OR_DEPARTMENT_OTHER): Payer: Self-pay

## 2020-11-05 ENCOUNTER — Other Ambulatory Visit (HOSPITAL_BASED_OUTPATIENT_CLINIC_OR_DEPARTMENT_OTHER): Payer: Self-pay

## 2020-11-07 ENCOUNTER — Inpatient Hospital Stay: Payer: Medicare HMO | Attending: Family Medicine

## 2020-11-07 ENCOUNTER — Other Ambulatory Visit: Payer: Self-pay

## 2020-11-07 ENCOUNTER — Inpatient Hospital Stay (HOSPITAL_BASED_OUTPATIENT_CLINIC_OR_DEPARTMENT_OTHER): Payer: Medicare HMO | Admitting: Family

## 2020-11-07 ENCOUNTER — Inpatient Hospital Stay: Payer: Medicare HMO

## 2020-11-07 ENCOUNTER — Encounter: Payer: Self-pay | Admitting: Family

## 2020-11-07 VITALS — BP 123/45 | HR 62 | Temp 99.1°F | Resp 18 | Wt 186.0 lb

## 2020-11-07 DIAGNOSIS — D631 Anemia in chronic kidney disease: Secondary | ICD-10-CM | POA: Insufficient documentation

## 2020-11-07 DIAGNOSIS — Z79899 Other long term (current) drug therapy: Secondary | ICD-10-CM | POA: Diagnosis not present

## 2020-11-07 DIAGNOSIS — D509 Iron deficiency anemia, unspecified: Secondary | ICD-10-CM | POA: Insufficient documentation

## 2020-11-07 DIAGNOSIS — N183 Chronic kidney disease, stage 3 unspecified: Secondary | ICD-10-CM | POA: Insufficient documentation

## 2020-11-07 DIAGNOSIS — N184 Chronic kidney disease, stage 4 (severe): Secondary | ICD-10-CM

## 2020-11-07 LAB — CMP (CANCER CENTER ONLY)
ALT: 9 U/L (ref 0–44)
AST: 12 U/L — ABNORMAL LOW (ref 15–41)
Albumin: 4.3 g/dL (ref 3.5–5.0)
Alkaline Phosphatase: 44 U/L (ref 38–126)
Anion gap: 8 (ref 5–15)
BUN: 46 mg/dL — ABNORMAL HIGH (ref 8–23)
CO2: 34 mmol/L — ABNORMAL HIGH (ref 22–32)
Calcium: 10.1 mg/dL (ref 8.9–10.3)
Chloride: 102 mmol/L (ref 98–111)
Creatinine: 1.56 mg/dL — ABNORMAL HIGH (ref 0.44–1.00)
GFR, Estimated: 34 mL/min — ABNORMAL LOW (ref >60)
Glucose, Bld: 102 mg/dL — ABNORMAL HIGH (ref 70–99)
Potassium: 4.3 mmol/L (ref 3.5–5.1)
Sodium: 144 mmol/L (ref 135–145)
Total Bilirubin: 0.4 mg/dL (ref 0.3–1.2)
Total Protein: 6.9 g/dL (ref 6.5–8.1)

## 2020-11-07 LAB — CBC WITH DIFFERENTIAL (CANCER CENTER ONLY)
Abs Immature Granulocytes: 0.06 10*3/uL (ref 0.00–0.07)
Basophils Absolute: 0 10*3/uL (ref 0.0–0.1)
Basophils Relative: 1 %
Eosinophils Absolute: 0.2 10*3/uL (ref 0.0–0.5)
Eosinophils Relative: 4 %
HCT: 34.2 % — ABNORMAL LOW (ref 36.0–46.0)
Hemoglobin: 10.6 g/dL — ABNORMAL LOW (ref 12.0–15.0)
Immature Granulocytes: 1 %
Lymphocytes Relative: 20 %
Lymphs Abs: 1.1 10*3/uL (ref 0.7–4.0)
MCH: 31.1 pg (ref 26.0–34.0)
MCHC: 31 g/dL (ref 30.0–36.0)
MCV: 100.3 fL — ABNORMAL HIGH (ref 80.0–100.0)
Monocytes Absolute: 0.6 10*3/uL (ref 0.1–1.0)
Monocytes Relative: 11 %
Neutro Abs: 3.5 10*3/uL (ref 1.7–7.7)
Neutrophils Relative %: 63 %
Platelet Count: 135 10*3/uL — ABNORMAL LOW (ref 150–400)
RBC: 3.41 MIL/uL — ABNORMAL LOW (ref 3.87–5.11)
RDW: 15 % (ref 11.5–15.5)
WBC Count: 5.4 10*3/uL (ref 4.0–10.5)
nRBC: 0 % (ref 0.0–0.2)

## 2020-11-07 LAB — RETICULOCYTES
Immature Retic Fract: 7 % (ref 2.3–15.9)
RBC.: 3.43 MIL/uL — ABNORMAL LOW (ref 3.87–5.11)
Retic Count, Absolute: 33.6 10*3/uL (ref 19.0–186.0)
Retic Ct Pct: 1 % (ref 0.4–3.1)

## 2020-11-07 MED ORDER — EPOETIN ALFA-EPBX 40000 UNIT/ML IJ SOLN
40000.0000 [IU] | Freq: Once | INTRAMUSCULAR | Status: AC
  Administered 2020-11-07: 40000 [IU] via SUBCUTANEOUS
  Filled 2020-11-07: qty 1

## 2020-11-07 NOTE — Patient Instructions (Signed)
[facility]  Discharge Instructions: Thank you for choosing [facility] to provide your oncology and hematology care.   If you have a lab appointment with the [facility], please go directly to the [facility] and check in at the registration area.  Wear comfortable clothing and clothing appropriate for easy access to any Portacath or PICC line.   We strive to give you quality time with your provider. You may need to reschedule your appointment if you arrive late (15 or more minutes).  Arriving late affects you and other patients whose appointments are after yours.  Also, if you miss three or more appointments without notifying the office, you may be dismissed from the clinic at the provider's discretion.      For prescription refill requests, have your pharmacy contact our office and allow 72 hours for refills to be completed.    Today you received the following chemotherapy and/or immunotherapy agents Retacrit.   To help prevent nausea and vomiting after your treatment, we encourage you to take your nausea medication as directed.  BELOW ARE SYMPTOMS THAT SHOULD BE REPORTED IMMEDIATELY: *FEVER GREATER THAN 100.4 F (38 C) OR HIGHER *CHILLS OR SWEATING *NAUSEA AND VOMITING THAT IS NOT CONTROLLED WITH YOUR NAUSEA MEDICATION *UNUSUAL SHORTNESS OF BREATH *UNUSUAL BRUISING OR BLEEDING *URINARY PROBLEMS (pain or burning when urinating, or frequent urination) *BOWEL PROBLEMS (unusual diarrhea, constipation, pain near the anus) TENDERNESS IN MOUTH AND THROAT WITH OR WITHOUT PRESENCE OF ULCERS (sore throat, sores in mouth, or a toothache) UNUSUAL RASH, SWELLING OR PAIN  UNUSUAL VAGINAL DISCHARGE OR ITCHING   Items with * indicate a potential emergency and should be followed up as soon as possible or go to the Emergency Department if any problems should occur.  Please show the CHEMOTHERAPY ALERT CARD or IMMUNOTHERAPY ALERT CARD at check-in to the  Emergency Department and triage nurse. Should you have questions after your visit or need to cancel or reschedule your appointment, please contact [facility]  [phone] and follow the prompts.  Office hours are 8:00 a.m. to 4:30 p.m. Monday - Friday. Please note that voicemails left after 4:00 p.m. may not be returned until the following business day.  We are closed weekends and major holidays. You have access to a nurse at all times for urgent questions. Please call the main number to the clinic [phone] and follow the prompts.  For any non-urgent questions, you may also contact your provider using MyChart. We now offer e-Visits for anyone 110 and older to request care online for non-urgent symptoms. For details visit [email or web address].   Also download the MyChart app! Go to the app store, search "MyChart", open the app, select [facility], and log in with your MyChart username and password.  Due to Covid, a mask is required upon entering the hospital/clinic. If you do not have a mask, one will be given to you upon arrival. For doctor visits, patients may have 1 support person aged 70 or older with them. For treatment visits, patients cannot have anyone with them due to current Covid guidelines and our immunocompromised population.

## 2020-11-07 NOTE — Progress Notes (Signed)
Hematology and Oncology Follow Up Visit  [name] [id number] 05/29/1940 80 y.o. 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 She is doing well and has no complaints at this time.  She has not noted any blood loss. No bruising or petechiae.  She is taking a daily oral iron supplement and she states that her stool is dark from this.  She feels her SOB is stable on 2L supplemental O2.  She notes occasional chest heaviness when stressed or over exertion. This resolves if she takes a break to rest.  No fever, chills, n/v, cough, rash, chest pain, palpitations, abdominal pain or changes in bowel or bladder habits.  The chronic swelling in her lower extremities is unchanged from baseline. Pedal pulses are 2+. No falls or syncope to report. She uses her Rolator when ambulating for added support.  She feels that she has a good appetite and is staying well hydrated. Her weight is stable at 186 lbs.   ECOG Performance Status: 1 - Symptomatic but completely ambulatory  Medications:  Allergies as of 11/07/2020       Reactions   Montelukast Sodium Palpitations, Other (See Comments)   REACTION: HEART PALPITATIONS, CHEST PAIN.   Sulfa Antibiotics Other (See Comments)   CHEST PAIN   Sulfonamide Derivatives Other (See Comments)   CHEST PAIN   Ace Inhibitors Other (See Comments)   PT. STATED UNKNOWN REACTION   Indomethacin Other (See Comments)   PT. STATED UNKNOWN REACTION        Medication List        Accurate as of November 07, 2020  1:42 PM. If you have any questions, ask your nurse or doctor.          Accu-Chek Aviva Plus test strip Generic drug: glucose blood CHECK BLOOD SUGAR ONE TIME WEEKLY.  DX CODE: E11.9   Accu-Chek Aviva Plus w/Device Kit CHECK BLOOD SUGAR  ONE TIME WEEKLY   Accu-Chek Aviva Soln USE AS DIRECTED   Accu-Chek Softclix Lancets lancets CHECK BLOOD SUGAR ONE TIME WEEKLY   acetaminophen 500 MG tablet Commonly known as: TYLENOL Take 500 mg by mouth 2 (two) times daily.   albuterol 108 (90 Base) MCG/ACT inhaler Commonly known as: VENTOLIN HFA Inhale 2 puffs into the lungs every 4 (four) hours as needed for wheezing or shortness of breath.   albuterol 0.63 MG/3ML nebulizer solution Commonly known as: ACCUNEB Take 3 mLs (0.63 mg total) by nebulization every 6 (six) hours as needed for wheezing or shortness of breath.   albuterol (2.5 MG/3ML) 0.083% nebulizer solution Commonly known as: PROVENTIL   Alcohol Wipes 70 % Pads To use before checking blood sugars   allopurinol 100 MG tablet Commonly known as: ZYLOPRIM TAKE 2 TABLETS ONE TIME DAILY   b complex vitamins tablet Take 1 tablet by mouth daily.   bacitracin ointment Apply 1 application topically 2 (two) times daily.   blood glucose meter kit and supplies Check blood sugars once weekly.   budesonide-formoterol 160-4.5 MCG/ACT inhaler Commonly known as: Symbicort Inhale 2 puffs into the lungs in the morning and at bedtime.   dextromethorphan-guaiFENesin 30-600 MG 12hr tablet Commonly known as: MUCINEX DM Take 1 tablet by mouth 2 (two) times daily.   docusate sodium 100  MG capsule Commonly known as: COLACE Take 1 capsule (100 mg total) by mouth 2 (two) times daily.   Ferrous Fumarate-Folic Acid 014-1 MG Tabs Commonly known as: Hemocyte-F Take 1 tablet by mouth daily.   fluticasone 50 MCG/ACT nasal spray Commonly known as: FLONASE Place 2 sprays into both nostrils daily.   furosemide 40 MG tablet Commonly known as: LASIX TAKE 1 TABLET DAILY AND A SECOND TABLET DAILY AS NEEDED FOR INCREASED EDEMA OR WEIGHT GAIN GREATER THAN 3 POUNDS IN 24 HOURS   loratadine 10 MG tablet Commonly known as: CLARITIN Take 1 tablet (10 mg total) by mouth at bedtime. What  changed:  when to take this reasons to take this   Magnesium Oxide 250 MG Tabs Take 1 tablet (250 mg total) by mouth daily.   metoprolol succinate 50 MG 24 hr tablet Commonly known as: TOPROL-XL TAKE 1 AND 1/2 TABLETS EVERY DAY WITH OR IMMEDIATELY FOLLOWING A MEAL   montelukast 10 MG tablet Commonly known as: SINGULAIR Take 1 tablet (10 mg total) by mouth at bedtime.   mupirocin ointment 2 % Commonly known as: BACTROBAN Apply 1 application topically 2 (two) times daily.   OXYGEN Inhale 2 L into the lungs continuous.   pantoprazole 40 MG tablet Commonly known as: PROTONIX Take 40 mg by mouth 2 (two) times daily.   polyethylene glycol 17 g packet Commonly known as: MIRALAX / GLYCOLAX Take 17 g by mouth daily.   pravastatin 40 MG tablet Commonly known as: PRAVACHOL Take 1 tablet (40 mg total) by mouth daily.   PROBIOTIC PO Take 1 tablet by mouth daily.   sertraline 100 MG tablet Commonly known as: ZOLOFT TAKE 1 TABLET EVERY DAY   traMADol 50 MG tablet Commonly known as: ULTRAM TAKE 1 TABLET BY MOUTH THREE TIMES DAILY AS NEEDED FOR MODERATE PAIN   trolamine salicylate 10 % cream Commonly known as: ASPERCREME Apply 1 application topically as needed for muscle pain.   vitamin B-12 1000 MCG tablet Commonly known as: CYANOCOBALAMIN Take 1,000 mcg by mouth daily.        Allergies:  Allergies  Allergen Reactions   Montelukast Sodium Palpitations and Other (See Comments)    REACTION: HEART PALPITATIONS, CHEST PAIN.   Sulfa Antibiotics Other (See Comments)    CHEST PAIN   Sulfonamide Derivatives Other (See Comments)    CHEST PAIN   Ace Inhibitors Other (See Comments)    PT. STATED UNKNOWN REACTION   Indomethacin Other (See Comments)    PT. STATED UNKNOWN REACTION    Past Medical History, Surgical history, Social history, and Family History were reviewed and updated.  Review of Systems: All other 10 point review of systems is negative.   Physical  Exam:  weight is 186 lb (84.4 kg). Her oral temperature is 99.1 F (37.3 C). Her blood pressure is 123/45 (abnormal) and her pulse is 62. Her respiration is 18 and oxygen saturation is 100%.   Wt Readings from Last 3 Encounters:  11/07/20 186 lb (84.4 kg)  09/26/20 181 lb (82.1 kg)  08/16/20 183 lb 1.3 oz (83 kg)    Ocular: Sclerae unicteric, pupils equal, round and reactive to light Ear-nose-throat: Oropharynx clear, dentition fair Lymphatic: No cervical or supraclavicular adenopathy Lungs no rales or rhonchi, good excursion bilaterally Heart regular rate and rhythm, no murmur appreciated Abd soft, nontender, positive bowel sounds MSK no focal spinal tenderness, no joint edema Neuro: non-focal, well-oriented, appropriate affect Breasts: Deferred   Lab Results  Component Value Date  WBC 5.4 11/07/2020   HGB 10.6 (L) 11/07/2020   HCT 34.2 (L) 11/07/2020   MCV 100.3 (H) 11/07/2020   PLT 135 (L) 11/07/2020   Lab Results  Component Value Date   FERRITIN 424 (H) 09/26/2020   IRON 131 09/26/2020   TIBC 246 09/26/2020   UIBC 115 (L) 09/26/2020   IRONPCTSAT 53 09/26/2020   Lab Results  Component Value Date   RETICCTPCT 1.0 11/07/2020   RBC 3.43 (L) 11/07/2020   No results found for: KPAFRELGTCHN, LAMBDASER, KAPLAMBRATIO No results found for: IGGSERUM, IGA, IGMSERUM No results found for: Odetta Pink, SPEI   Chemistry      Component Value Date/Time   NA 142 10/17/2020 1405   NA 143 01/27/2020 1535   K 4.9 10/17/2020 1405   CL 102 10/17/2020 1405   CO2 34 (H) 10/17/2020 1405   BUN 39 (H) 10/17/2020 1405   BUN 40 (H) 01/27/2020 1535   CREATININE 1.47 (H) 10/17/2020 1405   CREATININE 1.36 (H) 08/23/2013 1134      Component Value Date/Time   CALCIUM 9.7 10/17/2020 1405   ALKPHOS 38 10/17/2020 1405   AST 14 (L) 10/17/2020 1405   ALT 10 10/17/2020 1405   BILITOT 0.6 10/17/2020 1405       Impression and Plan:  Ms. Racine is a very pleasant 81 yo caucasian female with multifactorial anemia.  Iron studies are pending.  ESA given for Hgb 10.6.  Lab and injection every 3 weeks. Follow-up in 6 weeks.  She can contact our office with any questions or concerns.   Lottie Dawson, NP 10/19/20221:42 PM

## 2020-11-08 LAB — IRON AND TIBC
Iron: 81 ug/dL (ref 41–142)
Saturation Ratios: 34 % (ref 21–57)
TIBC: 236 ug/dL (ref 236–444)
UIBC: 155 ug/dL (ref 120–384)

## 2020-11-08 LAB — FERRITIN: Ferritin: 442 ng/mL — ABNORMAL HIGH (ref 11–307)

## 2020-11-12 ENCOUNTER — Other Ambulatory Visit: Payer: Self-pay

## 2020-11-12 ENCOUNTER — Encounter: Payer: Self-pay | Admitting: Family Medicine

## 2020-11-12 ENCOUNTER — Ambulatory Visit (INDEPENDENT_AMBULATORY_CARE_PROVIDER_SITE_OTHER): Payer: Medicare HMO | Admitting: Family Medicine

## 2020-11-12 VITALS — BP 116/64 | HR 89 | Temp 98.0°F | Resp 16 | Wt 187.0 lb

## 2020-11-12 DIAGNOSIS — E118 Type 2 diabetes mellitus with unspecified complications: Secondary | ICD-10-CM

## 2020-11-12 DIAGNOSIS — M109 Gout, unspecified: Secondary | ICD-10-CM | POA: Diagnosis not present

## 2020-11-12 DIAGNOSIS — N184 Chronic kidney disease, stage 4 (severe): Secondary | ICD-10-CM

## 2020-11-12 DIAGNOSIS — I10 Essential (primary) hypertension: Secondary | ICD-10-CM | POA: Diagnosis not present

## 2020-11-12 DIAGNOSIS — R0602 Shortness of breath: Secondary | ICD-10-CM

## 2020-11-12 DIAGNOSIS — H6191 Disorder of right external ear, unspecified: Secondary | ICD-10-CM | POA: Diagnosis not present

## 2020-11-12 DIAGNOSIS — E559 Vitamin D deficiency, unspecified: Secondary | ICD-10-CM

## 2020-11-12 DIAGNOSIS — M791 Myalgia, unspecified site: Secondary | ICD-10-CM | POA: Diagnosis not present

## 2020-11-12 DIAGNOSIS — D631 Anemia in chronic kidney disease: Secondary | ICD-10-CM

## 2020-11-12 DIAGNOSIS — E782 Mixed hyperlipidemia: Secondary | ICD-10-CM

## 2020-11-12 DIAGNOSIS — I5042 Chronic combined systolic (congestive) and diastolic (congestive) heart failure: Secondary | ICD-10-CM

## 2020-11-12 NOTE — Patient Instructions (Signed)
Shingrix is the new shingles shot, 2 shots over 2-6 months, confirm coverage with insurance and document, then can return here for shots with nurse appt or at pharmacy    Arthritis Arthritis means joint pain. It can also mean joint disease. A joint is a place where bones come together. There are more than 100 types of arthritis. What are the causes? This condition may be caused by: Wear and tear of a joint. This is the most common cause. A lot of acid in the blood, which leads to pain in the joint (gout). Pain and swelling (inflammation) in a joint. Infection of a joint. Injuries in the joint. A reaction to medicines (allergy). In some cases, the cause may not be known. What are the signs or symptoms? Symptoms of this condition include: Redness at a joint. Swelling at a joint. Stiffness at a joint. Warmth coming from the joint. A fever. A feeling of being sick. How is this treated? This condition may be treated with: Treating the cause, if it is known. Rest. Raising (elevating) the joint. Putting cold or hot packs on the joint. Medicines to treat symptoms and reduce pain and swelling. Shots of medicines (cortisone) into the joint. You may also be told to make changes in your life, such as doing exercises and losing weight. Follow these instructions at home: Medicines Take over-the-counter and prescription medicines only as told by your doctor. Do not take aspirin for pain if your doctor says that you may have gout. Activity Rest your joint if your doctor tells you to. Avoid activities that make the pain worse. Exercise your joint regularly as told by your doctor. Try doing exercises like: Swimming. Water aerobics. Biking. Walking. Managing pain, stiffness, and swelling   If told, put ice on the affected area. Put ice in a plastic bag. Place a towel between your skin and the bag. Leave the ice on for 20 minutes, 2-3 times per day. If your joint is swollen, raise  (elevate) it above the level of your heart if told by your doctor. If your joint feels stiff in the morning, try taking a warm shower. If told, put heat on the affected area. Do this as often as told by your doctor. Use the heat source that your doctor recommends, such as a moist heat pack or a heating pad. If you have diabetes, do not apply heat without asking your doctor. To apply heat: Place a towel between your skin and the heat source. Leave the heat on for 20-30 minutes. Remove the heat if your skin turns bright red. This is very important if you are unable to feel pain, heat, or cold. You may have a greater risk of getting burned. General instructions Do not use any products that contain nicotine or tobacco, such as cigarettes, e-cigarettes, and chewing tobacco. If you need help quitting, ask your doctor. Keep all follow-up visits as told by your doctor. This is important. Contact a doctor if: The pain gets worse. You have a fever. Get help right away if: You have very bad pain in your joint. You have swelling in your joint. Your joint is red. Many joints become painful and swollen. You have very bad back pain. Your leg is very weak. You cannot control your pee (urine) or poop (stool). Summary Arthritis means joint pain. It can also mean joint disease. A joint is a place where bones come together. The most common cause of this condition is wear and tear of a joint. Symptoms  of this condition include redness, swelling, or stiffness of the joint. This condition is treated with rest, raising the joint, medicines, and putting cold or hot packs on the joint. Follow your doctor's instructions about medicines, activity, exercises, and other home care treatments. This information is not intended to replace advice given to you by your health care provider. Make sure you discuss any questions you have with your health care provider. Document Revised: 12/14/2017 Document Reviewed:  12/14/2017 Elsevier Patient Education  2022 [employer].

## 2020-11-12 NOTE — Assessment & Plan Note (Signed)
Supplement and monitor 

## 2020-11-12 NOTE — Assessment & Plan Note (Signed)
Well controlled, no changes to meds. Encouraged heart healthy diet such as the DASH diet and exercise as tolerated.  °

## 2020-11-12 NOTE — Assessment & Plan Note (Signed)
She actually has an appt with her dermatologist tomorrow but needs an updated referral so that is placed today

## 2020-11-12 NOTE — Assessment & Plan Note (Signed)
Encourage heart healthy diet such as MIND or DASH diet, increase exercise, avoid trans fats, simple carbohydrates and processed foods, consider a krill or fish or flaxseed oil cap daily.  °

## 2020-11-12 NOTE — Assessment & Plan Note (Signed)
hgba1c acceptable, minimize simple carbs. Increase exercise as tolerated. Continue current meds 

## 2020-11-12 NOTE — Assessment & Plan Note (Signed)
On continuous O2 and does well while on 2L

## 2020-11-12 NOTE — Progress Notes (Signed)
Subjective:   By signing my name below, I, [name], attest that this documentation has been prepared under the direction and in the presence of  Mosie Lukes, MD. 11/12/2020    Patient ID: [name], female    DOB: November 17, 1940, 80 y.o.   MRN: [id number]  Chief Complaint  Patient presents with   3 months follow up    HPI Patient is in today for an office visit and 3 month f/u.  She has been using Voltaren to manage her pain in her thumbs and knees. She is also experiencing hip pain bilaterally with the right being worse than the left. The pain is exacerbated when she is walking and reduces when she sits down. She is managing the pain with tramadol and tylenol 2x daily.  She is requesting for a referral to a dermatologist. She has a raised mole on her right ear that is irritated and itches when she brushes her hair.  She was recently told by her nephrologist to stop using her potassium pills. It was first prescribed by her cardiologist because the level of potassium in her blood was low. She sees her nephrologist every 4 months.  She checks her blood sugar levels at home and they are within the normal range from 89-90. Sometimes, it rises to 101.  She is eating properly and drinks a lot of water.  She has 3 Pfizer Covid-19 vaccines at this time. She is not UTD on the shingles vaccine.  Past Medical History:  Diagnosis Date   Abnormal glucose tolerance test    Abnormal TSH 09/22/2016   ACE-inhibitor cough    Anemia 03/12/2014   Arthritis    Asthma    PFT 02/06/09 FEV1 1.41 (65%), FVC 1.92 (64), FEV1% 74, TLC 3.47 (71%), DLCO 48%, +BD   Atypical chest pain    s/p cath, Normal coronaries, Non ST elevation myocardial infarction, Rt groin pseudoaneurysm   Bacterial vaginosis 03/12/2014   Cellulitis 06/22/2016   Chronic kidney disease (CKD), stage III (moderate) ([place]) 08/04/2016   COPD (chronic obstructive pulmonary disease) (HCC)    Depression    Diastolic heart failure ([place])  04/07/2016   DVT (deep venous thrombosis) ([place]) 1987   GERD (gastroesophageal reflux disease)    Gout    Hypercalcemia 10/15/2014   Hyperlipidemia    Hypertension    Hypoxia 10/15/2014   Macular degeneration 04/10/2015   Osteopenia 12/29/2006   Qualifier: Diagnosis of  By: Wynona Luna    Pneumonia    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   Renal insufficiency 09/22/2016   Unspecified constipation 06/05/2013   Vitamin D deficiency 01/01/2015    Past Surgical History:  Procedure Laterality Date   [place]    Family History  Problem Relation Age of Onset   Asthma Sister    Hypertension Sister    Hyperlipidemia Sister    Uterine cancer Sister    Coronary artery disease Brother        x2   Arthritis Brother    Lung cancer Brother        smoker   Hypertension Sister    Arthritis Sister    Hyperlipidemia Sister    Emphysema Sister    COPD Sister        smoker   Heart disease Sister    Hyperlipidemia Sister    Hypertension Sister    Arthritis Sister    Emphysema Brother  Heart disease Brother         smoker   Heart attack Brother    Mental illness Father    Suicidality Father    Heart disease Mother    Hyperlipidemia Mother    Heart attack Mother    Epilepsy Daughter    Hypertension Daughter    Obesity Daughter    COPD Brother        smoker   Lung cancer Brother    Coronary artery disease Other    Colon polyps Sister     Social History   Socioeconomic History   Marital status: Widowed    Spouse name: Not on file   Number of children: 1   Years of education: Not on file   Highest education level: Not on file  Occupational History   Occupation: Retired    Fish farm manager: [employer]  Tobacco Use   Smoking status: Never   Smokeless tobacco: Never  Vaping Use   Vaping Use: Never used  Substance and Sexual Activity   Alcohol use: No   Drug use: No   Sexual activity: Never    Comment: lives alone,  no dietary restrictions  Other Topics Concern   Not on file  Social History Narrative   Not on file   Social Determinants of Health   Financial Resource Strain: Low Risk    Difficulty of Paying Living Expenses: Not hard at all  Food Insecurity: No Food Insecurity   Worried About Charity fundraiser in the Last Year: Never true   [place] in the Last Year: Never true  Transportation Needs: No Transportation Needs   Lack of Transportation (Medical): No   Lack of Transportation (Non-Medical): No  Physical Activity: Inactive   Days of Exercise per Week: 0 days   Minutes of Exercise per Session: 0 min  Stress: No Stress Concern Present   Feeling of Stress : Not at all  Social Connections: Socially Isolated   Frequency of Communication with Friends and Family: More than three times a week   Frequency of Social Gatherings with Friends and Family: More than three times a week   Attends Religious Services: Never   Marine scientist or Organizations: No   Attends Archivist Meetings: Never   Marital Status: Widowed  Human resources officer Violence: Not At Risk   Fear of Current or Ex-Partner: No   Emotionally Abused: No   Physically Abused: No   Sexually Abused: No    Outpatient Medications Prior to Visit  Medication Sig Dispense Refill   Accu-Chek Softclix Lancets lancets CHECK BLOOD SUGAR ONE TIME WEEKLY 50 each 1   acetaminophen (TYLENOL) 500 MG tablet Take 500 mg by mouth 2 (two) times daily.      albuterol (ACCUNEB) 0.63 MG/3ML nebulizer solution Take 3 mLs (0.63 mg total) by nebulization every 6 (six) hours as needed for wheezing or shortness of breath. 150 mL 3   albuterol (PROVENTIL) (2.5 MG/3ML) 0.083% nebulizer solution      albuterol (VENTOLIN HFA) 108 (90 Base) MCG/ACT inhaler Inhale 2 puffs into the lungs every 4 (four) hours as needed for wheezing or shortness of breath. 54 g 3   Alcohol Swabs (ALCOHOL WIPES) 70 % PADS To use before checking blood sugars  100 each 1   allopurinol (ZYLOPRIM) 100 MG tablet TAKE 2 TABLETS ONE TIME DAILY 180 tablet 1   b complex vitamins tablet Take 1 tablet by mouth daily.     bacitracin ointment  Apply 1 application topically 2 (two) times daily. 120 g 0   Blood Glucose Calibration (ACCU-CHEK AVIVA) SOLN USE AS DIRECTED 1 each 0   blood glucose meter kit and supplies Check blood sugars once weekly. 1 each 0   Blood Glucose Monitoring Suppl (ACCU-CHEK AVIVA PLUS) w/Device KIT CHECK BLOOD SUGAR ONE TIME WEEKLY 1 kit 1   budesonide-formoterol (SYMBICORT) 160-4.5 MCG/ACT inhaler Inhale 2 puffs into the lungs in the morning and at bedtime. 6 g 0   docusate sodium (COLACE) 100 MG capsule Take 1 capsule (100 mg total) by mouth 2 (two) times daily. 10 capsule 0   Ferrous Fumarate-Folic Acid (HEMOCYTE-F) 324-1 MG TABS Take 1 tablet by mouth daily. 90 each 1   fluticasone (FLONASE) 50 MCG/ACT nasal spray Place 2 sprays into both nostrils daily. 48 g 1   furosemide (LASIX) 40 MG tablet TAKE 1 TABLET DAILY AND A SECOND TABLET DAILY AS NEEDED FOR INCREASED EDEMA OR WEIGHT GAIN GREATER THAN 3 POUNDS IN 24 HOURS 100 tablet 5   loratadine (CLARITIN) 10 MG tablet Take 1 tablet (10 mg total) by mouth at bedtime. (Patient taking differently: Take 10 mg by mouth at bedtime as needed for allergies.) 30 tablet 5   metoprolol succinate (TOPROL-XL) 50 MG 24 hr tablet TAKE 1 AND 1/2 TABLETS EVERY DAY WITH OR IMMEDIATELY FOLLOWING A MEAL 135 tablet 1   montelukast (SINGULAIR) 10 MG tablet Take 1 tablet (10 mg total) by mouth at bedtime. 90 tablet 2   mupirocin ointment (BACTROBAN) 2 % Apply 1 application topically 2 (two) times daily. 30 g 0   OXYGEN Inhale 2 L into the lungs continuous.      pantoprazole (PROTONIX) 40 MG tablet Take 40 mg by mouth 2 (two) times daily.     polyethylene glycol (MIRALAX / GLYCOLAX) 17 g packet Take 17 g by mouth daily. 14 each 0   pravastatin (PRAVACHOL) 40 MG tablet Take 1 tablet (40 mg total) by mouth daily.  90 tablet 1   Probiotic Product (PROBIOTIC PO) Take 1 tablet by mouth daily.      sertraline (ZOLOFT) 100 MG tablet TAKE 1 TABLET EVERY DAY 90 tablet 1   traMADol (ULTRAM) 50 MG tablet TAKE 1 TABLET BY MOUTH THREE TIMES DAILY AS NEEDED FOR MODERATE PAIN 90 tablet 0   trolamine salicylate (ASPERCREME) 10 % cream Apply 1 application topically as needed for muscle pain.     vitamin B-12 (CYANOCOBALAMIN) 1000 MCG tablet Take 1,000 mcg by mouth daily.      dextromethorphan-guaiFENesin (MUCINEX DM) 30-600 MG 12hr tablet Take 1 tablet by mouth 2 (two) times daily. 20 tablet 0   glucose blood (ACCU-CHEK AVIVA PLUS) test strip CHECK BLOOD SUGAR ONE TIME WEEKLY.  DX CODE: E11.9 (Patient taking differently: CHECK BLOOD SUGAR ONE TIME WEEKLY.  DX CODE: E11.9) 50 strip 1   Magnesium Oxide 250 MG TABS Take 1 tablet (250 mg total) by mouth daily. 30 tablet 3   No facility-administered medications prior to visit.    Allergies  Allergen Reactions   Montelukast Sodium Palpitations and Other (See Comments)    REACTION: HEART PALPITATIONS, CHEST PAIN.   Sulfa Antibiotics Other (See Comments)    CHEST PAIN   Sulfonamide Derivatives Other (See Comments)    CHEST PAIN   Ace Inhibitors Other (See Comments)    PT. STATED UNKNOWN REACTION   Indomethacin Other (See Comments)    PT. STATED UNKNOWN REACTION    Review of Systems  Constitutional:  Negative for fever and malaise/fatigue.  HENT:  Negative for congestion.   Eyes:  Negative for redness.  Respiratory:  Negative for shortness of breath.   Cardiovascular:  Negative for chest pain, palpitations and leg swelling.  Gastrointestinal:  Negative for abdominal pain, blood in stool and nausea.  Genitourinary:  Negative for dysuria and frequency.  Musculoskeletal:  Positive for joint pain (knees, hips and thumbs). Negative for falls and myalgias.  Skin:  Negative for rash.  Neurological:  Negative for dizziness, loss of consciousness and headaches.   Endo/Heme/Allergies:  Negative for polydipsia.  Psychiatric/Behavioral:  Negative for depression. The patient is not nervous/anxious.       Objective:    Physical Exam Constitutional:      General: She is not in acute distress.    Appearance: She is well-developed.  HENT:     Head: Normocephalic and atraumatic.  Eyes:     Conjunctiva/sclera: Conjunctivae normal.  Neck:     Thyroid: No thyromegaly.  Cardiovascular:     Rate and Rhythm: Normal rate and regular rhythm.     Heart sounds: Normal heart sounds. No murmur heard. Pulmonary:     Effort: Pulmonary effort is normal. No respiratory distress.     Breath sounds: Rhonchi present.  Abdominal:     General: Bowel sounds are normal. There is no distension.     Palpations: Abdomen is soft. There is no mass.     Tenderness: There is no abdominal tenderness.  Musculoskeletal:     Cervical back: Neck supple.  Lymphadenopathy:     Cervical: No cervical adenopathy.  Skin:    General: Skin is warm and dry.  Neurological:     Mental Status: She is alert and oriented to person, place, and time.  Psychiatric:        Behavior: Behavior normal.    BP 116/64   Pulse 89   Temp 98 F (36.7 C)   Resp 16   Wt 187 lb (84.8 kg)   SpO2 90%   BMI 32.10 kg/m  Wt Readings from Last 3 Encounters:  11/12/20 187 lb (84.8 kg)  11/07/20 186 lb (84.4 kg)  09/26/20 181 lb (82.1 kg)    Diabetic Foot Exam - Simple   No data filed    Lab Results  Component Value Date   WBC 5.4 11/07/2020   HGB 10.6 (L) 11/07/2020   HCT 34.2 (L) 11/07/2020   PLT 135 (L) 11/07/2020   GLUCOSE 102 (H) 11/07/2020   CHOL 124 02/23/2020   TRIG 127.0 02/23/2020   HDL 48.20 02/23/2020   LDLDIRECT 73.0 06/16/2017   LDLCALC 50 02/23/2020   ALT 9 11/07/2020   AST 12 (L) 11/07/2020   NA 144 11/07/2020   K 4.3 11/07/2020   CL 102 11/07/2020   CREATININE 1.56 (H) 11/07/2020   BUN 46 (H) 11/07/2020   CO2 34 (H) 11/07/2020   TSH 4.79 (H) 07/06/2019   INR  1.0 09/12/2006   HGBA1C 5.5 02/23/2020   MICROALBUR <0.7 02/23/2020    Lab Results  Component Value Date   TSH 4.79 (H) 07/06/2019   Lab Results  Component Value Date   WBC 5.4 11/07/2020   HGB 10.6 (L) 11/07/2020   HCT 34.2 (L) 11/07/2020   MCV 100.3 (H) 11/07/2020   PLT 135 (L) 11/07/2020   Lab Results  Component Value Date   NA 144 11/07/2020   K 4.3 11/07/2020   CO2 34 (H) 11/07/2020   GLUCOSE 102 (H) 11/07/2020  BUN 46 (H) 11/07/2020   CREATININE 1.56 (H) 11/07/2020   BILITOT 0.4 11/07/2020   ALKPHOS 44 11/07/2020   AST 12 (L) 11/07/2020   ALT 9 11/07/2020   PROT 6.9 11/07/2020   ALBUMIN 4.3 11/07/2020   CALCIUM 10.1 11/07/2020   ANIONGAP 8 11/07/2020   GFR 30.62 (L) 02/23/2020   Lab Results  Component Value Date   CHOL 124 02/23/2020   Lab Results  Component Value Date   HDL 48.20 02/23/2020   Lab Results  Component Value Date   LDLCALC 50 02/23/2020   Lab Results  Component Value Date   TRIG 127.0 02/23/2020   Lab Results  Component Value Date   CHOLHDL 3 02/23/2020   Lab Results  Component Value Date   HGBA1C 5.5 02/23/2020       Assessment & Plan:   Problem List Items Addressed This Visit     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 Encouraged heart healthy diet such as the DASH diet and exercise as tolerated.       Relevant Orders   TSH   Diabetes type 2, controlled ([place])    hgba1c acceptable, minimize simple carbs. Increase exercise as tolerated. Continue current meds      Relevant Orders   Hemoglobin A1c   Hyperlipidemia, mixed    Encourage heart healthy diet such as MIND or DASH diet, increase exercise, avoid trans fats, simple carbohydrates and processed foods, consider a krill or fish or flaxseed oil cap daily.       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 No changes      Vitamin D deficiency    Supplement and monitor      Skin lesion of right ear    She actually has an appt with her dermatologist tomorrow but needs an updated referral so that is placed today      Relevant Orders   Ambulatory referral to Dermatology   Myalgia    Hydrate and monitor, stay as active as able, try topical treatments and continue tylenol Arthritis bid      Relevant Orders   Comprehensive metabolic panel   Magnesium    No orders of the defined types were placed in this encounter.   I,[name],acting as a Education administrator for Penni Homans, MD.,have documented all relevant documentation on the behalf of Penni Homans, MD,as directed by  Penni Homans, MD while in the presence of Penni Homans, MD.   I, Mosie Lukes, MD., personally preformed the services described in this documentation.  All medical record entries made by the scribe were at my direction and in my presence.  I have reviewed the chart and discharge instructions (if applicable) and agree that the record reflects my personal performance and is accurate and complete. 11/12/2020

## 2020-11-12 NOTE — Assessment & Plan Note (Signed)
Follows with nephrology, Hydrate and monitor

## 2020-11-12 NOTE — Assessment & Plan Note (Signed)
Hydrate and monitor, stay as active as able, try topical treatments and continue tylenol Arthritis bid

## 2020-11-12 NOTE — Assessment & Plan Note (Signed)
No recent exacerbation. No changes 

## 2020-11-13 ENCOUNTER — Encounter: Payer: Self-pay | Admitting: *Deleted

## 2020-11-13 DIAGNOSIS — L578 Other skin changes due to chronic exposure to nonionizing radiation: Secondary | ICD-10-CM | POA: Diagnosis not present

## 2020-11-13 DIAGNOSIS — J4520 Mild intermittent asthma, uncomplicated: Secondary | ICD-10-CM | POA: Diagnosis not present

## 2020-11-13 DIAGNOSIS — L570 Actinic keratosis: Secondary | ICD-10-CM | POA: Diagnosis not present

## 2020-11-13 DIAGNOSIS — L814 Other melanin hyperpigmentation: Secondary | ICD-10-CM | POA: Diagnosis not present

## 2020-11-13 DIAGNOSIS — C44212 Basal cell carcinoma of skin of right ear and external auricular canal: Secondary | ICD-10-CM | POA: Diagnosis not present

## 2020-11-13 DIAGNOSIS — R0902 Hypoxemia: Secondary | ICD-10-CM | POA: Diagnosis not present

## 2020-11-13 LAB — CBC
HCT: 33.5 % — ABNORMAL LOW (ref 36.0–46.0)
Hemoglobin: 10.7 g/dL — ABNORMAL LOW (ref 12.0–15.0)
MCHC: 31.8 g/dL (ref 30.0–36.0)
MCV: 99.2 fl (ref 78.0–100.0)
Platelets: 147 10*3/uL — ABNORMAL LOW (ref 150.0–400.0)
RBC: 3.37 Mil/uL — ABNORMAL LOW (ref 3.87–5.11)
RDW: 16.2 % — ABNORMAL HIGH (ref 11.5–15.5)
WBC: 5.2 10*3/uL (ref 4.0–10.5)

## 2020-11-13 LAB — COMPREHENSIVE METABOLIC PANEL
ALT: 8 U/L (ref 0–35)
AST: 13 U/L (ref 0–37)
Albumin: 4.1 g/dL (ref 3.5–5.2)
Alkaline Phosphatase: 43 U/L (ref 39–117)
BUN: 40 mg/dL — ABNORMAL HIGH (ref 6–23)
CO2: 35 mEq/L — ABNORMAL HIGH (ref 19–32)
Calcium: 9.2 mg/dL (ref 8.4–10.5)
Chloride: 99 mEq/L (ref 96–112)
Creatinine, Ser: 1.82 mg/dL — ABNORMAL HIGH (ref 0.40–1.20)
GFR: 26.1 mL/min — ABNORMAL LOW (ref >60.00)
Glucose, Bld: 107 mg/dL — ABNORMAL HIGH (ref 70–99)
Potassium: 3.9 mEq/L (ref 3.5–5.1)
Sodium: 141 mEq/L (ref 135–145)
Total Bilirubin: 0.5 mg/dL (ref 0.2–1.2)
Total Protein: 6.3 g/dL (ref 6.0–8.3)

## 2020-11-13 LAB — TSH: TSH: 2.74 u[IU]/mL (ref 0.35–5.50)

## 2020-11-13 LAB — LIPID PANEL
Cholesterol: 124 mg/dL (ref 0–200)
HDL: 50.4 mg/dL (ref >39.00)
LDL Cholesterol: 53 mg/dL (ref 0–99)
NonHDL: 73.78
Total CHOL/HDL Ratio: 2
Triglycerides: 106 mg/dL (ref 0.0–149.0)
VLDL: 21.2 mg/dL (ref 0.0–40.0)

## 2020-11-13 LAB — MAGNESIUM: Magnesium: 1.7 mg/dL (ref 1.5–2.5)

## 2020-11-13 LAB — HEMOGLOBIN A1C: Hgb A1c MFr Bld: 5.5 % (ref 4.6–6.5)

## 2020-11-17 ENCOUNTER — Other Ambulatory Visit: Payer: Self-pay | Admitting: Family Medicine

## 2020-11-21 ENCOUNTER — Telehealth: Payer: Self-pay

## 2020-11-21 NOTE — Telephone Encounter (Signed)
Patient has been approved for prolia via [place] and medical PA:  PA case # [id number] starts 11/30/2020 to 01/19/2022 patient advised and scheduled 12/05/2020 at 11:15 am and aware of 290>00 out of pocket fee.

## 2020-11-21 NOTE — Telephone Encounter (Signed)
[name] has been approved for Prolia per [place] and Medical benefit PA form : PA case #[id number] approved 11/30/20 to 01/19/2022  Patient advised and scheduled    [name]

## 2020-11-27 ENCOUNTER — Ambulatory Visit: Payer: Medicare HMO

## 2020-11-28 ENCOUNTER — Inpatient Hospital Stay: Payer: Medicare HMO | Attending: Family Medicine

## 2020-11-28 ENCOUNTER — Inpatient Hospital Stay: Payer: Medicare HMO

## 2020-11-28 ENCOUNTER — Other Ambulatory Visit: Payer: Self-pay

## 2020-11-28 VITALS — BP 124/42 | HR 71 | Temp 98.0°F | Resp 19

## 2020-11-28 DIAGNOSIS — D631 Anemia in chronic kidney disease: Secondary | ICD-10-CM | POA: Diagnosis not present

## 2020-11-28 DIAGNOSIS — N183 Chronic kidney disease, stage 3 unspecified: Secondary | ICD-10-CM | POA: Insufficient documentation

## 2020-11-28 DIAGNOSIS — D509 Iron deficiency anemia, unspecified: Secondary | ICD-10-CM

## 2020-11-28 DIAGNOSIS — E611 Iron deficiency: Secondary | ICD-10-CM | POA: Insufficient documentation

## 2020-11-28 DIAGNOSIS — Z79899 Other long term (current) drug therapy: Secondary | ICD-10-CM | POA: Diagnosis not present

## 2020-11-28 LAB — CMP (CANCER CENTER ONLY)
ALT: 8 U/L (ref 0–44)
AST: 13 U/L — ABNORMAL LOW (ref 15–41)
Albumin: 4.1 g/dL (ref 3.5–5.0)
Alkaline Phosphatase: 41 U/L (ref 38–126)
Anion gap: 8 (ref 5–15)
BUN: 44 mg/dL — ABNORMAL HIGH (ref 8–23)
CO2: 35 mmol/L — ABNORMAL HIGH (ref 22–32)
Calcium: 10 mg/dL (ref 8.9–10.3)
Chloride: 98 mmol/L (ref 98–111)
Creatinine: 1.75 mg/dL — ABNORMAL HIGH (ref 0.44–1.00)
GFR, Estimated: 29 mL/min — ABNORMAL LOW (ref >60)
Glucose, Bld: 97 mg/dL (ref 70–99)
Potassium: 4.5 mmol/L (ref 3.5–5.1)
Sodium: 141 mmol/L (ref 135–145)
Total Bilirubin: 0.6 mg/dL (ref 0.3–1.2)
Total Protein: 6.6 g/dL (ref 6.5–8.1)

## 2020-11-28 LAB — CBC WITH DIFFERENTIAL (CANCER CENTER ONLY)
Abs Immature Granulocytes: 0.02 10*3/uL (ref 0.00–0.07)
Basophils Absolute: 0.1 10*3/uL (ref 0.0–0.1)
Basophils Relative: 1 %
Eosinophils Absolute: 0.3 10*3/uL (ref 0.0–0.5)
Eosinophils Relative: 5 %
HCT: 34.8 % — ABNORMAL LOW (ref 36.0–46.0)
Hemoglobin: 10.7 g/dL — ABNORMAL LOW (ref 12.0–15.0)
Immature Granulocytes: 0 %
Lymphocytes Relative: 17 %
Lymphs Abs: 1.1 10*3/uL (ref 0.7–4.0)
MCH: 31.1 pg (ref 26.0–34.0)
MCHC: 30.7 g/dL (ref 30.0–36.0)
MCV: 101.2 fL — ABNORMAL HIGH (ref 80.0–100.0)
Monocytes Absolute: 0.8 10*3/uL (ref 0.1–1.0)
Monocytes Relative: 12 %
Neutro Abs: 4.4 10*3/uL (ref 1.7–7.7)
Neutrophils Relative %: 65 %
Platelet Count: 121 10*3/uL — ABNORMAL LOW (ref 150–400)
RBC: 3.44 MIL/uL — ABNORMAL LOW (ref 3.87–5.11)
RDW: 14.8 % (ref 11.5–15.5)
WBC Count: 6.7 10*3/uL (ref 4.0–10.5)
nRBC: 0 % (ref 0.0–0.2)

## 2020-11-28 MED ORDER — EPOETIN ALFA-EPBX 40000 UNIT/ML IJ SOLN
40000.0000 [IU] | Freq: Once | INTRAMUSCULAR | Status: AC
  Administered 2020-11-28: 40000 [IU] via SUBCUTANEOUS
  Filled 2020-11-28: qty 1

## 2020-11-28 NOTE — Patient Instructions (Signed)
Epoetin Alfa injection °What is this medication? °EPOETIN ALFA (e POE e tin AL fa) helps your body make more red blood cells. This medicine is used to treat anemia caused by chronic kidney disease, cancer chemotherapy, or HIV-therapy. It may also be used before surgery if you have anemia. °This medicine may be used for other purposes; ask your health care provider or pharmacist if you have questions. °COMMON BRAND NAME(S): Epogen, Procrit, Retacrit °What should I tell my care team before I take this medication? °They need to know if you have any of these conditions: °cancer °heart disease °high blood pressure °history of blood clots °history of stroke °low levels of folate, iron, or vitamin B12 in the blood °seizures °an unusual or allergic reaction to erythropoietin, albumin, benzyl alcohol, hamster proteins, other medicines, foods, dyes, or preservatives °pregnant or trying to get pregnant °breast-feeding °How should I use this medication? °This medicine is for injection into a vein or under the skin. It is usually given by a health care professional in a hospital or clinic setting. °If you get this medicine at home, you will be taught how to prepare and give this medicine. Use exactly as directed. Take your medicine at regular intervals. Do not take your medicine more often than directed. °It is important that you put your used needles and syringes in a special sharps container. Do not put them in a trash can. If you do not have a sharps container, call your pharmacist or healthcare provider to get one. °A special MedGuide will be given to you by the pharmacist with each prescription and refill. Be sure to read this information carefully each time. °Talk to your pediatrician regarding the use of this medicine in children. While this drug may be prescribed for selected conditions, precautions do apply. °Overdosage: If you think you have taken too much of this medicine contact a poison control center or emergency  room at once. °NOTE: This medicine is only for you. Do not share this medicine with others. °What if I miss a dose? °If you miss a dose, take it as soon as you can. If it is almost time for your next dose, take only that dose. Do not take double or extra doses. °What may interact with this medication? °Interactions have not been studied. °This list may not describe all possible interactions. Give your health care provider a list of all the medicines, herbs, non-prescription drugs, or dietary supplements you use. Also tell them if you smoke, drink alcohol, or use illegal drugs. Some items may interact with your medicine. °What should I watch for while using this medication? °Your condition will be monitored carefully while you are receiving this medicine. °You may need blood work done while you are taking this medicine. °This medicine may cause a decrease in vitamin B6. You should make sure that you get enough vitamin B6 while you are taking this medicine. Discuss the foods you eat and the vitamins you take with your health care professional. °What side effects may I notice from receiving this medication? °Side effects that you should report to your doctor or health care professional as soon as possible: °allergic reactions like skin rash, itching or hives, swelling of the face, lips, or tongue °seizures °signs and symptoms of a blood clot such as breathing problems; changes in vision; chest pain; severe, sudden headache; pain, swelling, warmth in the leg; trouble speaking; sudden numbness or weakness of the face, arm or leg °signs and symptoms of a stroke like   changes in vision; confusion; trouble speaking or understanding; severe headaches; sudden numbness or weakness of the face, arm or leg; trouble walking; dizziness; loss of balance or coordination °Side effects that usually do not require medical attention (report to your doctor or health care professional if they continue or are  bothersome): °chills °cough °dizziness °fever °headaches °joint pain °muscle cramps °muscle pain °nausea, vomiting °pain, redness, or irritation at site where injected °This list may not describe all possible side effects. Call your doctor for medical advice about side effects. You may report side effects to FDA at 1-800-FDA-1088. °Where should I keep my medication? °Keep out of the reach of children. °Store in a refrigerator between 2 and 8 degrees C (36 and 46 degrees F). Do not freeze or shake. Throw away any unused portion if using a single-dose vial. Multi-dose vials can be kept in the refrigerator for up to 21 days after the initial dose. Throw away unused medicine. °NOTE: This sheet is a summary. It may not cover all possible information. If you have questions about this medicine, talk to your doctor, pharmacist, or health care provider. °© 2022 [employer]vier/Gold Standard (2016-09-09 00:00:00) ° °

## 2020-11-30 ENCOUNTER — Other Ambulatory Visit: Payer: Self-pay | Admitting: Family Medicine

## 2020-12-05 ENCOUNTER — Ambulatory Visit (INDEPENDENT_AMBULATORY_CARE_PROVIDER_SITE_OTHER): Payer: Medicare HMO | Admitting: *Deleted

## 2020-12-05 ENCOUNTER — Other Ambulatory Visit: Payer: Self-pay

## 2020-12-05 DIAGNOSIS — M810 Age-related osteoporosis without current pathological fracture: Secondary | ICD-10-CM | POA: Diagnosis not present

## 2020-12-05 MED ORDER — DENOSUMAB 60 MG/ML ~~LOC~~ SOSY
60.0000 mg | PREFILLED_SYRINGE | Freq: Once | SUBCUTANEOUS | Status: AC
Start: 2020-12-05 — End: 2020-12-05
  Administered 2020-12-05: 60 mg via SUBCUTANEOUS

## 2020-12-05 NOTE — Progress Notes (Signed)
Patient her for Prolia injection per Dr. Charlett Blake.  Injection given in left arm and patient tolerated well.  Next injection due on or around 06/05/21.  Advised patient to call about a week prior to next injection.

## 2020-12-07 ENCOUNTER — Telehealth: Payer: Self-pay | Admitting: *Deleted

## 2020-12-07 NOTE — Telephone Encounter (Signed)
Prolia given on 12/05/20.

## 2020-12-14 DIAGNOSIS — J4520 Mild intermittent asthma, uncomplicated: Secondary | ICD-10-CM | POA: Diagnosis not present

## 2020-12-14 DIAGNOSIS — R0902 Hypoxemia: Secondary | ICD-10-CM | POA: Diagnosis not present

## 2020-12-19 ENCOUNTER — Inpatient Hospital Stay: Payer: Medicare HMO

## 2020-12-19 ENCOUNTER — Inpatient Hospital Stay: Payer: Medicare HMO | Admitting: Family

## 2020-12-25 ENCOUNTER — Ambulatory Visit: Payer: Medicare HMO

## 2020-12-25 ENCOUNTER — Inpatient Hospital Stay (HOSPITAL_BASED_OUTPATIENT_CLINIC_OR_DEPARTMENT_OTHER): Payer: Medicare HMO | Admitting: Family

## 2020-12-25 ENCOUNTER — Inpatient Hospital Stay: Payer: Medicare HMO | Attending: Family Medicine

## 2020-12-25 ENCOUNTER — Other Ambulatory Visit: Payer: Self-pay

## 2020-12-25 ENCOUNTER — Encounter: Payer: Self-pay | Admitting: Family

## 2020-12-25 VITALS — BP 122/77 | HR 71 | Temp 97.8°F | Resp 20 | Ht 64.0 in | Wt 182.1 lb

## 2020-12-25 DIAGNOSIS — D509 Iron deficiency anemia, unspecified: Secondary | ICD-10-CM

## 2020-12-25 DIAGNOSIS — E611 Iron deficiency: Secondary | ICD-10-CM | POA: Insufficient documentation

## 2020-12-25 DIAGNOSIS — Z79899 Other long term (current) drug therapy: Secondary | ICD-10-CM | POA: Diagnosis not present

## 2020-12-25 DIAGNOSIS — N183 Chronic kidney disease, stage 3 unspecified: Secondary | ICD-10-CM

## 2020-12-25 DIAGNOSIS — D631 Anemia in chronic kidney disease: Secondary | ICD-10-CM | POA: Insufficient documentation

## 2020-12-25 LAB — CMP (CANCER CENTER ONLY)
ALT: 9 U/L (ref 0–44)
AST: 13 U/L — ABNORMAL LOW (ref 15–41)
Albumin: 4.2 g/dL (ref 3.5–5.0)
Alkaline Phosphatase: 44 U/L (ref 38–126)
Anion gap: 6 (ref 5–15)
BUN: 37 mg/dL — ABNORMAL HIGH (ref 8–23)
CO2: 34 mmol/L — ABNORMAL HIGH (ref 22–32)
Calcium: 9.5 mg/dL (ref 8.9–10.3)
Chloride: 101 mmol/L (ref 98–111)
Creatinine: 1.47 mg/dL — ABNORMAL HIGH (ref 0.44–1.00)
GFR, Estimated: 36 mL/min — ABNORMAL LOW (ref >60)
Glucose, Bld: 104 mg/dL — ABNORMAL HIGH (ref 70–99)
Potassium: 4.7 mmol/L (ref 3.5–5.1)
Sodium: 141 mmol/L (ref 135–145)
Total Bilirubin: 0.5 mg/dL (ref 0.3–1.2)
Total Protein: 6.7 g/dL (ref 6.5–8.1)

## 2020-12-25 LAB — IRON AND TIBC
Iron: 103 ug/dL (ref 41–142)
Saturation Ratios: 46 % (ref 21–57)
TIBC: 226 ug/dL — ABNORMAL LOW (ref 236–444)
UIBC: 123 ug/dL (ref 120–384)

## 2020-12-25 LAB — CBC WITH DIFFERENTIAL (CANCER CENTER ONLY)
Abs Immature Granulocytes: 0.07 10*3/uL (ref 0.00–0.07)
Basophils Absolute: 0 10*3/uL (ref 0.0–0.1)
Basophils Relative: 1 %
Eosinophils Absolute: 0.3 10*3/uL (ref 0.0–0.5)
Eosinophils Relative: 5 %
HCT: 36.4 % (ref 36.0–46.0)
Hemoglobin: 11.4 g/dL — ABNORMAL LOW (ref 12.0–15.0)
Immature Granulocytes: 1 %
Lymphocytes Relative: 22 %
Lymphs Abs: 1.3 10*3/uL (ref 0.7–4.0)
MCH: 31.4 pg (ref 26.0–34.0)
MCHC: 31.3 g/dL (ref 30.0–36.0)
MCV: 100.3 fL — ABNORMAL HIGH (ref 80.0–100.0)
Monocytes Absolute: 0.7 10*3/uL (ref 0.1–1.0)
Monocytes Relative: 12 %
Neutro Abs: 3.5 10*3/uL (ref 1.7–7.7)
Neutrophils Relative %: 59 %
Platelet Count: 126 10*3/uL — ABNORMAL LOW (ref 150–400)
RBC: 3.63 MIL/uL — ABNORMAL LOW (ref 3.87–5.11)
RDW: 14.5 % (ref 11.5–15.5)
WBC Count: 6 10*3/uL (ref 4.0–10.5)
nRBC: 0 % (ref 0.0–0.2)

## 2020-12-25 LAB — RETICULOCYTES
Immature Retic Fract: 11.5 % (ref 2.3–15.9)
RBC.: 3.63 MIL/uL — ABNORMAL LOW (ref 3.87–5.11)
Retic Count, Absolute: 31.6 10*3/uL (ref 19.0–186.0)
Retic Ct Pct: 0.9 % (ref 0.4–3.1)

## 2020-12-25 LAB — FERRITIN: Ferritin: 424 ng/mL — ABNORMAL HIGH (ref 11–307)

## 2020-12-25 NOTE — Progress Notes (Signed)
Hematology and Oncology Follow Up Visit  [name] [id number] 12-12-1940 80 y.o. 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 Hgb is 11.4, no ESA needed this visit.  She has some fatigue at times as well as mild SOB. She feels that her SOB is stable on 2L supplemental O2 24 hours a day.  No blood loss noted. No abnormal bruising, no petechiae.  She states that she received Prolia from her PCP and had 2 weeks of joint aches and pains.  No fever, chills, n/v, cough, rash, dizziness, chest pain, palpitations, abdominal pain or changes in bladder habits.  She takes a stool softener as needed for constipation which helps.  The fluid retention in her lower extremities is controlled with diuretics and propping her feet up when sitting.  No numbness or tingling in her extremities at this time.  No falls or syncope to report. She uses her Rolator when ambulating for added support.  She has a good appetite and is staying well hydrated. Her weight is stable at 182 lbs.   ECOG Performance Status: 1 - Symptomatic but completely ambulatory  Medications:  Allergies as of 12/25/2020       Reactions   Montelukast Sodium Palpitations, Other (See Comments)   REACTION: HEART PALPITATIONS, CHEST PAIN.   Sulfa Antibiotics Other (See Comments)   CHEST PAIN   Sulfonamide Derivatives Other (See Comments)   CHEST PAIN   Ace Inhibitors Other (See Comments)   PT. STATED UNKNOWN REACTION   Indomethacin Other (See Comments)   PT. STATED UNKNOWN REACTION        Medication List        Accurate as of December 25, 2020  8:59 AM. If you have any questions, ask your nurse or doctor.          Accu-Chek Aviva Soln USE AS DIRECTED WITH GLUCOMETER   Accu-Chek Guide w/Device Kit USE AS DIRECTED TO CHECK  BLOOD SUGAR   Accu-Chek Softclix Lancets lancets CHECK BLOOD SUGAR ONE TIME WEEKLY   acetaminophen 500 MG tablet Commonly known as: TYLENOL Take 500 mg by mouth 2 (two) times daily.   albuterol 108 (90 Base) MCG/ACT inhaler Commonly known as: VENTOLIN HFA Inhale 2 puffs into the lungs every 4 (four) hours as needed for wheezing or shortness of breath.   albuterol 0.63 MG/3ML nebulizer solution Commonly known as: ACCUNEB Take 3 mLs (0.63 mg total) by nebulization every 6 (six) hours as needed for wheezing or shortness of breath.   albuterol (2.5 MG/3ML) 0.083% nebulizer solution Commonly known as: PROVENTIL   Alcohol Wipes 70 % Pads To use before checking blood sugars   allopurinol 100 MG tablet Commonly known as: ZYLOPRIM TAKE 2 TABLETS ONE TIME DAILY   b complex vitamins tablet Take 1 tablet by mouth daily.   bacitracin ointment Apply 1 application topically 2 (two) times daily.   blood glucose meter kit and supplies Check blood sugars once weekly.   docusate sodium 100 MG capsule Commonly known as: COLACE Take 1 capsule (100 mg total) by mouth 2 (two) times daily.   Ferrous Fumarate-Folic Acid 811-9 MG Tabs Commonly known as: Hemocyte-F Take 1 tablet by mouth daily.   fluticasone 50 MCG/ACT nasal spray Commonly known as: FLONASE Place 2 sprays  into both nostrils daily.   furosemide 40 MG tablet Commonly known as: LASIX TAKE 1 TABLET DAILY AND A SECOND TABLET DAILY AS NEEDED FOR INCREASED EDEMA OR WEIGHT GAIN GREATER THAN 3 POUNDS IN 24 HOURS   loratadine 10 MG tablet Commonly known as: CLARITIN Take 1 tablet (10 mg total) by mouth at bedtime. What changed:  when to take this reasons to take this   metoprolol succinate 50 MG 24 hr tablet Commonly known as: TOPROL-XL TAKE 1 AND 1/2 TABLETS EVERY DAY WITH OR IMMEDIATELY FOLLOWING A MEAL   montelukast 10 MG tablet Commonly known as: SINGULAIR Take 1 tablet (10 mg total) by mouth at bedtime.   mupirocin  ointment 2 % Commonly known as: BACTROBAN Apply 1 application topically 2 (two) times daily.   OXYGEN Inhale 2 L into the lungs continuous.   pantoprazole 40 MG tablet Commonly known as: PROTONIX Take 40 mg by mouth 2 (two) times daily.   polyethylene glycol 17 g packet Commonly known as: MIRALAX / GLYCOLAX Take 17 g by mouth daily.   pravastatin 40 MG tablet Commonly known as: PRAVACHOL Take 1 tablet (40 mg total) by mouth daily.   PROBIOTIC PO Take 1 tablet by mouth daily.   sertraline 100 MG tablet Commonly known as: ZOLOFT TAKE 1 TABLET EVERY DAY   Symbicort 160-4.5 MCG/ACT inhaler Generic drug: budesonide-formoterol INHALE 2 PUFFS TWICE DAILY IN THE MORNING  AND AT BEDTIME   traMADol 50 MG tablet Commonly known as: ULTRAM TAKE 1 TABLET BY MOUTH THREE TIMES DAILY AS NEEDED FOR MODERATE PAIN   trolamine salicylate 10 % cream Commonly known as: ASPERCREME Apply 1 application topically as needed for muscle pain.   vitamin B-12 1000 MCG tablet Commonly known as: CYANOCOBALAMIN Take 1,000 mcg by mouth daily.        Allergies:  Allergies  Allergen Reactions   Montelukast Sodium Palpitations and Other (See Comments)    REACTION: HEART PALPITATIONS, CHEST PAIN.   Sulfa Antibiotics Other (See Comments)    CHEST PAIN   Sulfonamide Derivatives Other (See Comments)    CHEST PAIN   Ace Inhibitors Other (See Comments)    PT. STATED UNKNOWN REACTION   Indomethacin Other (See Comments)    PT. STATED UNKNOWN REACTION    Past Medical History, Surgical history, Social history, and Family History were reviewed and updated.  Review of Systems: All other 10 point review of systems is negative.   Physical Exam:  vitals were not taken for this visit.   Wt Readings from Last 3 Encounters:  11/12/20 187 lb (84.8 kg)  11/07/20 186 lb (84.4 kg)  09/26/20 181 lb (82.1 kg)    Ocular: Sclerae unicteric, pupils equal, round and reactive to light Ear-nose-throat:  Oropharynx clear, dentition fair Lymphatic: No cervical or supraclavicular adenopathy Lungs no rales or rhonchi, good excursion bilaterally Heart regular rate and rhythm, no murmur appreciated Abd soft, nontender, positive bowel sounds MSK no focal spinal tenderness, no joint edema Neuro: non-focal, well-oriented, appropriate affect Breasts: Deferred   Lab Results  Component Value Date   WBC 6.0 12/25/2020   HGB 11.4 (L) 12/25/2020   HCT 36.4 12/25/2020   MCV 100.3 (H) 12/25/2020   PLT 126 (L) 12/25/2020   Lab Results  Component Value Date   FERRITIN 442 (H) 11/07/2020   IRON 81 11/07/2020   TIBC 236 11/07/2020   UIBC 155 11/07/2020   IRONPCTSAT 34 11/07/2020   Lab Results  Component Value Date   RETICCTPCT 0.9  12/25/2020   RBC 3.63 (L) 12/25/2020   No results found for: KPAFRELGTCHN, LAMBDASER, KAPLAMBRATIO No results found for: IGGSERUM, IGA, IGMSERUM No results found for: Odetta Pink, SPEI   Chemistry      Component Value Date/Time   NA 141 11/28/2020 1331   NA 143 01/27/2020 1535   K 4.5 11/28/2020 1331   CL 98 11/28/2020 1331   CO2 35 (H) 11/28/2020 1331   BUN 44 (H) 11/28/2020 1331   BUN 40 (H) 01/27/2020 1535   CREATININE 1.75 (H) 11/28/2020 1331   CREATININE 1.36 (H) 08/23/2013 1134      Component Value Date/Time   CALCIUM 10.0 11/28/2020 1331   ALKPHOS 41 11/28/2020 1331   AST 13 (L) 11/28/2020 1331   ALT 8 11/28/2020 1331   BILITOT 0.6 11/28/2020 1331       Impression and Plan: [name] is a very pleasant 80 yo caucasian female with multifactorial anemia.  No ESA needed this visit.  Iron studies pending. We will replace if needed.  Lab and injection every 3 weeks with follow-up in 9 weeks.   Lottie Dawson, NP 12/6/20228:59 AM

## 2020-12-26 ENCOUNTER — Telehealth: Payer: Self-pay | Admitting: *Deleted

## 2020-12-26 NOTE — Telephone Encounter (Signed)
Per 12/25/20 los - called and gave upcoming appointments - confirmed

## 2021-01-02 DIAGNOSIS — C44212 Basal cell carcinoma of skin of right ear and external auricular canal: Secondary | ICD-10-CM | POA: Diagnosis not present

## 2021-01-13 DIAGNOSIS — J4520 Mild intermittent asthma, uncomplicated: Secondary | ICD-10-CM | POA: Diagnosis not present

## 2021-01-13 DIAGNOSIS — R0902 Hypoxemia: Secondary | ICD-10-CM | POA: Diagnosis not present

## 2021-01-14 ENCOUNTER — Other Ambulatory Visit: Payer: Self-pay | Admitting: Family Medicine

## 2021-01-15 ENCOUNTER — Inpatient Hospital Stay: Payer: Medicare HMO

## 2021-01-15 ENCOUNTER — Other Ambulatory Visit: Payer: Self-pay

## 2021-01-15 VITALS — BP 114/53 | HR 85 | Temp 98.5°F | Resp 20

## 2021-01-15 DIAGNOSIS — D631 Anemia in chronic kidney disease: Secondary | ICD-10-CM

## 2021-01-15 DIAGNOSIS — E611 Iron deficiency: Secondary | ICD-10-CM | POA: Diagnosis not present

## 2021-01-15 DIAGNOSIS — D509 Iron deficiency anemia, unspecified: Secondary | ICD-10-CM

## 2021-01-15 DIAGNOSIS — N183 Chronic kidney disease, stage 3 unspecified: Secondary | ICD-10-CM | POA: Diagnosis not present

## 2021-01-15 DIAGNOSIS — Z79899 Other long term (current) drug therapy: Secondary | ICD-10-CM | POA: Diagnosis not present

## 2021-01-15 LAB — CBC WITH DIFFERENTIAL (CANCER CENTER ONLY)
Abs Immature Granulocytes: 0.02 10*3/uL (ref 0.00–0.07)
Basophils Absolute: 0 10*3/uL (ref 0.0–0.1)
Basophils Relative: 1 %
Eosinophils Absolute: 0.3 10*3/uL (ref 0.0–0.5)
Eosinophils Relative: 5 %
HCT: 32.5 % — ABNORMAL LOW (ref 36.0–46.0)
Hemoglobin: 10.1 g/dL — ABNORMAL LOW (ref 12.0–15.0)
Immature Granulocytes: 0 %
Lymphocytes Relative: 22 %
Lymphs Abs: 1.2 10*3/uL (ref 0.7–4.0)
MCH: 31.6 pg (ref 26.0–34.0)
MCHC: 31.1 g/dL (ref 30.0–36.0)
MCV: 101.6 fL — ABNORMAL HIGH (ref 80.0–100.0)
Monocytes Absolute: 0.7 10*3/uL (ref 0.1–1.0)
Monocytes Relative: 12 %
Neutro Abs: 3.2 10*3/uL (ref 1.7–7.7)
Neutrophils Relative %: 60 %
Platelet Count: 104 10*3/uL — ABNORMAL LOW (ref 150–400)
RBC: 3.2 MIL/uL — ABNORMAL LOW (ref 3.87–5.11)
RDW: 14.5 % (ref 11.5–15.5)
WBC Count: 5.4 10*3/uL (ref 4.0–10.5)
nRBC: 0 % (ref 0.0–0.2)

## 2021-01-15 LAB — CMP (CANCER CENTER ONLY)
ALT: 7 U/L (ref 0–44)
AST: 11 U/L — ABNORMAL LOW (ref 15–41)
Albumin: 4 g/dL (ref 3.5–5.0)
Alkaline Phosphatase: 43 U/L (ref 38–126)
Anion gap: 8 (ref 5–15)
BUN: 42 mg/dL — ABNORMAL HIGH (ref 8–23)
CO2: 31 mmol/L (ref 22–32)
Calcium: 9.5 mg/dL (ref 8.9–10.3)
Chloride: 101 mmol/L (ref 98–111)
Creatinine: 1.59 mg/dL — ABNORMAL HIGH (ref 0.44–1.00)
GFR, Estimated: 33 mL/min — ABNORMAL LOW (ref >60)
Glucose, Bld: 101 mg/dL — ABNORMAL HIGH (ref 70–99)
Potassium: 4.3 mmol/L (ref 3.5–5.1)
Sodium: 140 mmol/L (ref 135–145)
Total Bilirubin: 0.5 mg/dL (ref 0.3–1.2)
Total Protein: 6.5 g/dL (ref 6.5–8.1)

## 2021-01-15 MED ORDER — EPOETIN ALFA-EPBX 40000 UNIT/ML IJ SOLN
40000.0000 [IU] | Freq: Once | INTRAMUSCULAR | Status: AC
  Administered 2021-01-15: 12:00:00 40000 [IU] via SUBCUTANEOUS
  Filled 2021-01-15: qty 1

## 2021-01-15 NOTE — Telephone Encounter (Signed)
Requesting:tramadol 50 mg Contract:unknown XMD:[id number] Last Visit:11/12/20 Next Visit:03/21/21 Last Refill:12/03/20  Please Advise

## 2021-01-15 NOTE — Patient Instructions (Signed)
Epoetin Alfa injection °What is this medication? °EPOETIN ALFA (e POE e tin AL fa) helps your body make more red blood cells. This medicine is used to treat anemia caused by chronic kidney disease, cancer chemotherapy, or HIV-therapy. It may also be used before surgery if you have anemia. °This medicine may be used for other purposes; ask your health care provider or pharmacist if you have questions. °COMMON BRAND NAME(S): Epogen, Procrit, Retacrit °What should I tell my care team before I take this medication? °They need to know if you have any of these conditions: °cancer °heart disease °high blood pressure °history of blood clots °history of stroke °low levels of folate, iron, or vitamin B12 in the blood °seizures °an unusual or allergic reaction to erythropoietin, albumin, benzyl alcohol, hamster proteins, other medicines, foods, dyes, or preservatives °pregnant or trying to get pregnant °breast-feeding °How should I use this medication? °This medicine is for injection into a vein or under the skin. It is usually given by a health care professional in a hospital or clinic setting. °If you get this medicine at home, you will be taught how to prepare and give this medicine. Use exactly as directed. Take your medicine at regular intervals. Do not take your medicine more often than directed. °It is important that you put your used needles and syringes in a special sharps container. Do not put them in a trash can. If you do not have a sharps container, call your pharmacist or healthcare provider to get one. °A special MedGuide will be given to you by the pharmacist with each prescription and refill. Be sure to read this information carefully each time. °Talk to your pediatrician regarding the use of this medicine in children. While this drug may be prescribed for selected conditions, precautions do apply. °Overdosage: If you think you have taken too much of this medicine contact a poison control center or emergency  room at once. °NOTE: This medicine is only for you. Do not share this medicine with others. °What if I miss a dose? °If you miss a dose, take it as soon as you can. If it is almost time for your next dose, take only that dose. Do not take double or extra doses. °What may interact with this medication? °Interactions have not been studied. °This list may not describe all possible interactions. Give your health care provider a list of all the medicines, herbs, non-prescription drugs, or dietary supplements you use. Also tell them if you smoke, drink alcohol, or use illegal drugs. Some items may interact with your medicine. °What should I watch for while using this medication? °Your condition will be monitored carefully while you are receiving this medicine. °You may need blood work done while you are taking this medicine. °This medicine may cause a decrease in vitamin B6. You should make sure that you get enough vitamin B6 while you are taking this medicine. Discuss the foods you eat and the vitamins you take with your health care professional. °What side effects may I notice from receiving this medication? °Side effects that you should report to your doctor or health care professional as soon as possible: °allergic reactions like skin rash, itching or hives, swelling of the face, lips, or tongue °seizures °signs and symptoms of a blood clot such as breathing problems; changes in vision; chest pain; severe, sudden headache; pain, swelling, warmth in the leg; trouble speaking; sudden numbness or weakness of the face, arm or leg °signs and symptoms of a stroke like   changes in vision; confusion; trouble speaking or understanding; severe headaches; sudden numbness or weakness of the face, arm or leg; trouble walking; dizziness; loss of balance or coordination °Side effects that usually do not require medical attention (report to your doctor or health care professional if they continue or are  bothersome): °chills °cough °dizziness °fever °headaches °joint pain °muscle cramps °muscle pain °nausea, vomiting °pain, redness, or irritation at site where injected °This list may not describe all possible side effects. Call your doctor for medical advice about side effects. You may report side effects to FDA at 1-800-FDA-1088. °Where should I keep my medication? °Keep out of the reach of children. °Store in a refrigerator between 2 and 8 degrees C (36 and 46 degrees F). Do not freeze or shake. Throw away any unused portion if using a single-dose vial. Multi-dose vials can be kept in the refrigerator for up to 21 days after the initial dose. Throw away unused medicine. °NOTE: This sheet is a summary. It may not cover all possible information. If you have questions about this medicine, talk to your doctor, pharmacist, or health care provider. °© 2022 [employer]vier/Gold Standard (2016-09-09 00:00:00) ° °

## 2021-01-29 DIAGNOSIS — N184 Chronic kidney disease, stage 4 (severe): Secondary | ICD-10-CM | POA: Diagnosis not present

## 2021-01-29 DIAGNOSIS — E1122 Type 2 diabetes mellitus with diabetic chronic kidney disease: Secondary | ICD-10-CM | POA: Diagnosis not present

## 2021-01-29 DIAGNOSIS — D631 Anemia in chronic kidney disease: Secondary | ICD-10-CM | POA: Diagnosis not present

## 2021-01-29 DIAGNOSIS — I129 Hypertensive chronic kidney disease with stage 1 through stage 4 chronic kidney disease, or unspecified chronic kidney disease: Secondary | ICD-10-CM | POA: Diagnosis not present

## 2021-02-05 ENCOUNTER — Inpatient Hospital Stay: Payer: Medicare HMO | Attending: Family Medicine

## 2021-02-05 ENCOUNTER — Inpatient Hospital Stay: Payer: Medicare HMO

## 2021-02-05 ENCOUNTER — Other Ambulatory Visit: Payer: Self-pay

## 2021-02-05 DIAGNOSIS — N183 Chronic kidney disease, stage 3 unspecified: Secondary | ICD-10-CM | POA: Insufficient documentation

## 2021-02-05 DIAGNOSIS — Z79899 Other long term (current) drug therapy: Secondary | ICD-10-CM | POA: Diagnosis not present

## 2021-02-05 DIAGNOSIS — D631 Anemia in chronic kidney disease: Secondary | ICD-10-CM | POA: Diagnosis not present

## 2021-02-05 LAB — CBC WITH DIFFERENTIAL (CANCER CENTER ONLY)
Abs Immature Granulocytes: 0.02 10*3/uL (ref 0.00–0.07)
Basophils Absolute: 0 10*3/uL (ref 0.0–0.1)
Basophils Relative: 1 %
Eosinophils Absolute: 0.2 10*3/uL (ref 0.0–0.5)
Eosinophils Relative: 4 %
HCT: 36.2 % (ref 36.0–46.0)
Hemoglobin: 11.2 g/dL — ABNORMAL LOW (ref 12.0–15.0)
Immature Granulocytes: 0 %
Lymphocytes Relative: 22 %
Lymphs Abs: 1.3 10*3/uL (ref 0.7–4.0)
MCH: 31.4 pg (ref 26.0–34.0)
MCHC: 30.9 g/dL (ref 30.0–36.0)
MCV: 101.4 fL — ABNORMAL HIGH (ref 80.0–100.0)
Monocytes Absolute: 0.7 10*3/uL (ref 0.1–1.0)
Monocytes Relative: 11 %
Neutro Abs: 3.6 10*3/uL (ref 1.7–7.7)
Neutrophils Relative %: 62 %
Platelet Count: 124 10*3/uL — ABNORMAL LOW (ref 150–400)
RBC: 3.57 MIL/uL — ABNORMAL LOW (ref 3.87–5.11)
RDW: 14.6 % (ref 11.5–15.5)
WBC Count: 5.8 10*3/uL (ref 4.0–10.5)
nRBC: 0 % (ref 0.0–0.2)

## 2021-02-05 LAB — CMP (CANCER CENTER ONLY)
ALT: 13 U/L (ref 0–44)
AST: 18 U/L (ref 15–41)
Albumin: 4.4 g/dL (ref 3.5–5.0)
Alkaline Phosphatase: 37 U/L — ABNORMAL LOW (ref 38–126)
Anion gap: 8 (ref 5–15)
BUN: 45 mg/dL — ABNORMAL HIGH (ref 8–23)
CO2: 33 mmol/L — ABNORMAL HIGH (ref 22–32)
Calcium: 10.5 mg/dL — ABNORMAL HIGH (ref 8.9–10.3)
Chloride: 101 mmol/L (ref 98–111)
Creatinine: 1.57 mg/dL — ABNORMAL HIGH (ref 0.44–1.00)
GFR, Estimated: 33 mL/min — ABNORMAL LOW (ref >60)
Glucose, Bld: 112 mg/dL — ABNORMAL HIGH (ref 70–99)
Potassium: 4.3 mmol/L (ref 3.5–5.1)
Sodium: 142 mmol/L (ref 135–145)
Total Bilirubin: 0.6 mg/dL (ref 0.3–1.2)
Total Protein: 6.9 g/dL (ref 6.5–8.1)

## 2021-02-05 NOTE — Progress Notes (Signed)
Retacrit not needed per lab values today.

## 2021-02-12 ENCOUNTER — Other Ambulatory Visit: Payer: Self-pay

## 2021-02-12 ENCOUNTER — Ambulatory Visit: Payer: Medicare HMO | Admitting: Cardiology

## 2021-02-13 DIAGNOSIS — J4520 Mild intermittent asthma, uncomplicated: Secondary | ICD-10-CM | POA: Diagnosis not present

## 2021-02-13 DIAGNOSIS — R0902 Hypoxemia: Secondary | ICD-10-CM | POA: Diagnosis not present

## 2021-02-25 ENCOUNTER — Encounter: Payer: Self-pay | Admitting: Family Medicine

## 2021-02-25 ENCOUNTER — Other Ambulatory Visit: Payer: Self-pay

## 2021-02-25 ENCOUNTER — Ambulatory Visit (HOSPITAL_BASED_OUTPATIENT_CLINIC_OR_DEPARTMENT_OTHER)
Admission: RE | Admit: 2021-02-25 | Discharge: 2021-02-25 | Disposition: A | Discharge status: Home / Self Care | Payer: Medicare HMO | Source: Ambulatory Visit | Attending: Family Medicine | Admitting: Family Medicine

## 2021-02-25 ENCOUNTER — Ambulatory Visit (INDEPENDENT_AMBULATORY_CARE_PROVIDER_SITE_OTHER): Payer: Medicare HMO | Admitting: Family Medicine

## 2021-02-25 VITALS — BP 110/68 | HR 82 | Temp 97.8°F | Resp 16 | Wt 188.6 lb

## 2021-02-25 DIAGNOSIS — R7989 Other specified abnormal findings of blood chemistry: Secondary | ICD-10-CM | POA: Diagnosis not present

## 2021-02-25 DIAGNOSIS — M25561 Pain in right knee: Secondary | ICD-10-CM | POA: Diagnosis not present

## 2021-02-25 DIAGNOSIS — E559 Vitamin D deficiency, unspecified: Secondary | ICD-10-CM

## 2021-02-25 DIAGNOSIS — E118 Type 2 diabetes mellitus with unspecified complications: Secondary | ICD-10-CM

## 2021-02-25 DIAGNOSIS — R251 Tremor, unspecified: Secondary | ICD-10-CM | POA: Diagnosis not present

## 2021-02-25 DIAGNOSIS — N184 Chronic kidney disease, stage 4 (severe): Secondary | ICD-10-CM | POA: Diagnosis not present

## 2021-02-25 DIAGNOSIS — I10 Essential (primary) hypertension: Secondary | ICD-10-CM | POA: Diagnosis not present

## 2021-02-25 DIAGNOSIS — M1711 Unilateral primary osteoarthritis, right knee: Secondary | ICD-10-CM | POA: Diagnosis not present

## 2021-02-25 DIAGNOSIS — M549 Dorsalgia, unspecified: Secondary | ICD-10-CM | POA: Diagnosis not present

## 2021-02-25 MED ORDER — FLUOXETINE HCL 20 MG PO TABS: 20.0000 mg | ORAL_TABLET | Freq: Every day | ORAL | 3 refills | Status: DC

## 2021-02-25 NOTE — Assessment & Plan Note (Signed)
monitor

## 2021-02-25 NOTE — Patient Instructions (Signed)
Acute Knee Pain, Adult Acute knee pain is sudden and may be caused by damage, swelling, or irritation of the muscles and tissues that support the knee. Pain may result from: A fall. An injury to the knee from twisting motions. A hit to the knee. Infection. Acute knee pain may go away on its own with time and rest. If it does not, your health care provider may order tests to find the cause of the pain. These may include: Imaging tests, such as an X-ray, MRI, CT scan, or ultrasound. Joint aspiration. In this test, fluid is removed from the knee and evaluated. Arthroscopy. In this test, a lighted tube is inserted into the knee and an image is projected onto a TV screen. Biopsy. In this test, a sample of tissue is removed from the body and studied under a microscope. Follow these instructions at home: If you have a knee sleeve or brace:  Wear the knee sleeve or brace as told by your health care provider. Remove it only as told by your health care provider. Loosen it if your toes tingle, become numb, or turn cold and blue. Keep it clean. If the knee sleeve or brace is not waterproof: Do not let it get wet. Cover it with a watertight covering when you take a bath or shower.  Activity Rest your knee. Do not do things that cause pain or make pain worse. Avoid high-impact activities or exercises, such as running, jumping rope, or doing jumping jacks. Work with a physical therapist to make a safe exercise program, as recommended by your health care provider. Do exercises as told by your physical therapist. Managing pain, stiffness, and swelling  If directed, put ice on the affected knee. To do this: If you have a removable knee sleeve or brace, remove it as told by your health care provider. Put ice in a plastic bag. Place a towel between your skin and the bag. Leave the ice on for 20 minutes, 2-3 times a day. Remove the ice if your skin turns bright red. This is very important. If you cannot  feel pain, heat, or cold, you have a greater risk of damage to the area. If directed, use an elastic bandage to put pressure (compression) on your injured knee. This may control swelling, give support, and help with discomfort. Raise (elevate) your knee above the level of your heart while you are sitting or lying down. Sleep with a pillow under your knee.  General instructions Take over-the-counter and prescription medicines only as told by your health care provider. Do not use any products that contain nicotine or tobacco, such as cigarettes, e-cigarettes, and chewing tobacco. If you need help quitting, ask your health care provider. If you are overweight, work with your health care provider and a dietitian to set a weight-loss goal that is healthy and reasonable for you. Extra weight can put pressure on your knee. Pay attention to any changes in your symptoms. Keep all follow-up visits. This is important. Contact a health care provider if: Your knee pain continues, changes, or gets worse. You have a fever along with knee pain. Your knee feels warm to the touch or is red. Your knee buckles or locks up. Get help right away if: Your knee swells, and the swelling becomes worse. You cannot move your knee. You have severe pain in your knee that cannot be managed with pain medicine. Summary Acute knee pain can be caused by a fall, an injury, an infection, or damage, swelling,   or irritation of the tissues that support your knee. Your health care provider may perform tests to find out the cause of the pain. Pay attention to any changes in your symptoms. Relieve your pain with rest, medicines, light activity, and the use of ice. Get help right away if your knee swells, you cannot move your knee, or you have severe pain that cannot be managed with medicine. This information is not intended to replace advice given to you by your health care provider. Make sure you discuss any questions you have with  your healthcare provider. Document Revised: 06/22/2019 Document Reviewed: 06/22/2019 Elsevier Patient Education  2022 [employer]  

## 2021-02-25 NOTE — Assessment & Plan Note (Signed)
Supplement and monitor 

## 2021-02-25 NOTE — Assessment & Plan Note (Signed)
Notes increasing tremor in hands for about 6 months. Also notes at times she will have involuntary jerking of her hands at times. She is offered a referral to neurology to evaluate but declines. She will let us know if she is ready to proceed

## 2021-02-25 NOTE — Assessment & Plan Note (Addendum)
Low back, Encouraged moist heat and gentle stretching as tolerated. May try tylenol and prescription meds as directed and report if symptoms worsen or seek immediate care

## 2021-02-25 NOTE — Assessment & Plan Note (Signed)
She denies any injury but notes some swelling and pain. Check xray and referred to ortho for further evaluation

## 2021-02-25 NOTE — Assessment & Plan Note (Signed)
hgba1c acceptable, minimize simple carbs. Increase exercise as tolerated. Continue current meds 

## 2021-02-25 NOTE — Progress Notes (Signed)
Subjective:   By signing my name below, I, [name], attest that this documentation has been prepared under the direction and in the presence of Mosie Lukes, MD. 02/25/2021      Patient ID: [name], female    DOB: 1940/05/20, 81 y.o.   MRN: [id number]  Chief Complaint  Patient presents with   Consult    Referral    HPI Patient is in today for an office visit.  She would like a referral to an orthopedic surgeon for worsening right knee pain and swelling. Adds the pain has been present for a while but has been getting worse. Notes that it hurts more in the front than in the back. It Is not disturbing her sleep but is limiting her daily activities. She has been using lidocaine but it provides little relief.  She does not think 100 mg sertraline is helping to manage her mood. She stopped it a couple of weeks ago and did not notice a significant difference. She is more tired and is staying depressed for a longer time. Would like to try a new medication. She is not interested in seeing a counselor at this time.  She also reports her hands have been shaking more frequently and is dropping things. Notes that they happen at random times though the day. She does not have it all the time and does not think it is serious enough. She also adds she has been having intermittent involuntary movements in her hands everyday for the past year.  Past Medical History:  Diagnosis Date   Abnormal glucose tolerance test    Abnormal TSH 09/22/2016   ACE-inhibitor cough    Anemia 03/12/2014   Arthritis    Asthma    PFT 02/06/09 FEV1 1.41 (65%), FVC 1.92 (64), FEV1% 74, TLC 3.47 (71%), DLCO 48%, +BD   Atypical chest pain    s/p cath, Normal coronaries, Non ST elevation myocardial infarction, Rt groin pseudoaneurysm   Bacterial vaginosis 03/12/2014   Cellulitis 06/22/2016   Chronic kidney disease (CKD), stage III (moderate) ([place]) 08/04/2016   COPD (chronic obstructive pulmonary disease) ([facility])     Depression    Diastolic heart failure ([place]) 04/07/2016   DVT (deep venous thrombosis) ([place]) 1987   GERD (gastroesophageal reflux disease)    Gout    Hypercalcemia 10/15/2014   Hyperlipidemia    Hypertension    Hypoxia 10/15/2014   Macular degeneration 04/10/2015   Osteopenia 12/29/2006   Qualifier: Diagnosis of  By: Wynona Luna    Pneumonia    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/2016   Renal insufficiency 09/22/2016   Unspecified constipation 06/05/2013   Vitamin D deficiency 01/01/2015    Past Surgical History:  Procedure Laterality Date   [place]    Family History  Problem Relation Age of Onset   Asthma Sister    Hypertension Sister    Hyperlipidemia Sister    Uterine cancer Sister    Coronary artery disease Brother        x2   Arthritis Brother    Lung cancer Brother        smoker   Hypertension Sister    Arthritis Sister    Hyperlipidemia Sister    Emphysema Sister    COPD Sister        smoker   Heart disease Sister    Hyperlipidemia Sister    Hypertension Sister  Arthritis Sister    Emphysema Brother    Heart disease Brother         smoker   Heart attack Brother    Mental illness Father    Suicidality Father    Heart disease Mother    Hyperlipidemia Mother    Heart attack Mother    Epilepsy Daughter    Hypertension Daughter    Obesity Daughter    COPD Brother        smoker   Lung cancer Brother    Coronary artery disease Other    Colon polyps Sister     Social History   Socioeconomic History   Marital status: Widowed    Spouse name: Not on file   Number of children: 1   Years of education: Not on file   Highest education level: Not on file  Occupational History   Occupation: Retired    Fish farm manager: [employer]  Tobacco Use   Smoking status: Never   Smokeless tobacco: Never  Vaping Use   Vaping Use: Never used  Substance and Sexual Activity   Alcohol use: No   Drug use: No    Sexual activity: Never    Comment: lives alone, no dietary restrictions  Other Topics Concern   Not on file  Social History Narrative   Not on file   Social Determinants of Health   Financial Resource Strain: Low Risk    Difficulty of Paying Living Expenses: Not hard at all  Food Insecurity: No Food Insecurity   Worried About Charity fundraiser in the Last Year: Never true   [place] in the Last Year: Never true  Transportation Needs: No Transportation Needs   Lack of Transportation (Medical): No   Lack of Transportation (Non-Medical): No  Physical Activity: Inactive   Days of Exercise per Week: 0 days   Minutes of Exercise per Session: 0 min  Stress: No Stress Concern Present   Feeling of Stress : Not at all  Social Connections: Socially Isolated   Frequency of Communication with Friends and Family: More than three times a week   Frequency of Social Gatherings with Friends and Family: More than three times a week   Attends Religious Services: Never   Marine scientist or Organizations: No   Attends Archivist Meetings: Never   Marital Status: Widowed  Human resources officer Violence: Not At Risk   Fear of Current or Ex-Partner: No   Emotionally Abused: No   Physically Abused: No   Sexually Abused: No    Outpatient Medications Prior to Visit  Medication Sig Dispense Refill   Accu-Chek Softclix Lancets lancets CHECK BLOOD SUGAR ONE TIME WEEKLY 50 each 1   acetaminophen (TYLENOL) 500 MG tablet Take 500 mg by mouth 2 (two) times daily.      albuterol (ACCUNEB) 0.63 MG/3ML nebulizer solution Take 3 mLs (0.63 mg total) by nebulization every 6 (six) hours as needed for wheezing or shortness of breath. 150 mL 3   albuterol (PROVENTIL) (2.5 MG/3ML) 0.083% nebulizer solution      albuterol (VENTOLIN HFA) 108 (90 Base) MCG/ACT inhaler Inhale 2 puffs into the lungs every 4 (four) hours as needed for wheezing or shortness of breath. 54 g 3   Alcohol Swabs (ALCOHOL  WIPES) 70 % PADS To use before checking blood sugars 100 each 1   allopurinol (ZYLOPRIM) 100 MG tablet TAKE 2 TABLETS ONE TIME DAILY 180 tablet 1   b complex vitamins tablet Take 1  tablet by mouth daily.     bacitracin ointment Apply 1 application topically 2 (two) times daily. 120 g 0   Blood Glucose Calibration (ACCU-CHEK AVIVA) SOLN USE AS DIRECTED WITH GLUCOMETER 1 each 0   blood glucose meter kit and supplies Check blood sugars once weekly. 1 each 0   Blood Glucose Monitoring Suppl (ACCU-CHEK GUIDE) w/Device KIT USE AS DIRECTED TO CHECK BLOOD SUGAR 1 kit 1   docusate sodium (COLACE) 100 MG capsule Take 1 capsule (100 mg total) by mouth 2 (two) times daily. 10 capsule 0   Ferrous Fumarate-Folic Acid (HEMOCYTE-F) 324-1 MG TABS Take 1 tablet by mouth daily. 90 each 1   fluticasone (FLONASE) 50 MCG/ACT nasal spray Place 2 sprays into both nostrils daily. 48 g 1   furosemide (LASIX) 40 MG tablet TAKE 1 TABLET DAILY AND A SECOND TABLET DAILY AS NEEDED FOR INCREASED EDEMA OR WEIGHT GAIN GREATER THAN 3 POUNDS IN 24 HOURS 100 tablet 5   loratadine (CLARITIN) 10 MG tablet Take 1 tablet (10 mg total) by mouth at bedtime. (Patient taking differently: Take 10 mg by mouth at bedtime as needed for allergies.) 30 tablet 5   metoprolol succinate (TOPROL-XL) 50 MG 24 hr tablet TAKE 1 AND 1/2 TABLETS EVERY DAY WITH OR IMMEDIATELY FOLLOWING A MEAL 135 tablet 1   montelukast (SINGULAIR) 10 MG tablet Take 1 tablet (10 mg total) by mouth at bedtime. 90 tablet 2   mupirocin ointment (BACTROBAN) 2 % Apply 1 application topically 2 (two) times daily. 30 g 0   OXYGEN Inhale 2 L into the lungs continuous.      pantoprazole (PROTONIX) 40 MG tablet Take 40 mg by mouth 2 (two) times daily.     polyethylene glycol (MIRALAX / GLYCOLAX) 17 g packet Take 17 g by mouth daily. 14 each 0   pravastatin (PRAVACHOL) 40 MG tablet Take 1 tablet (40 mg total) by mouth daily. 90 tablet 1   Probiotic Product (PROBIOTIC PO) Take 1 tablet  by mouth daily.      SYMBICORT 160-4.5 MCG/ACT inhaler INHALE 2 PUFFS TWICE DAILY IN THE MORNING  AND AT BEDTIME 3 each 1   traMADol (ULTRAM) 50 MG tablet TAKE 1 TABLET BY MOUTH THREE TIMES DAILY AS NEEDED FOR MODERATE PAIN 90 tablet 0   trolamine salicylate (ASPERCREME) 10 % cream Apply 1 application topically as needed for muscle pain.     vitamin B-12 (CYANOCOBALAMIN) 1000 MCG tablet Take 1,000 mcg by mouth daily.      sertraline (ZOLOFT) 100 MG tablet TAKE 1 TABLET EVERY DAY 90 tablet 1   No facility-administered medications prior to visit.    Allergies  Allergen Reactions   Montelukast Sodium Palpitations and Other (See Comments)    REACTION: HEART PALPITATIONS, CHEST PAIN.   Sulfa Antibiotics Other (See Comments)    CHEST PAIN   Sulfonamide Derivatives Other (See Comments)    CHEST PAIN   Ace Inhibitors Other (See Comments)    PT. STATED UNKNOWN REACTION   Indomethacin Other (See Comments)    PT. STATED UNKNOWN REACTION    Review of Systems  Constitutional:  Negative for fever and malaise/fatigue.  HENT:  Negative for congestion, sore throat and tinnitus.   Eyes:  Negative for redness.  Respiratory:  Negative for shortness of breath.   Cardiovascular:  Negative for chest pain, palpitations and leg swelling.  Gastrointestinal:  Negative for abdominal pain, blood in stool and nausea.  Genitourinary:  Negative for dysuria and frequency.  Musculoskeletal:  Positive for back pain (lower) and joint pain (right knee). Negative for falls.  Skin:  Negative for rash.  Neurological:  Positive for tremors (hand). Negative for dizziness, loss of consciousness and headaches.  Endo/Heme/Allergies:  Negative for polydipsia.  Psychiatric/Behavioral:  Positive for depression. The patient is not nervous/anxious.       Objective:    Physical Exam Constitutional:      General: She is not in acute distress.    Appearance: She is well-developed.  HENT:     Head: Normocephalic and  atraumatic.  Eyes:     Conjunctiva/sclera: Conjunctivae normal.  Neck:     Thyroid: No thyromegaly.  Cardiovascular:     Rate and Rhythm: Normal rate and regular rhythm.     Heart sounds: Normal heart sounds. No murmur heard. Pulmonary:     Effort: Pulmonary effort is normal. No respiratory distress.     Breath sounds: Examination of the right-lower field reveals wheezing. Examination of the left-lower field reveals wheezing. Wheezing present.  Abdominal:     General: Bowel sounds are normal. There is no distension.     Palpations: Abdomen is soft. There is no mass.     Tenderness: There is no abdominal tenderness.  Musculoskeletal:     Cervical back: Neck supple.  Lymphadenopathy:     Cervical: No cervical adenopathy.  Skin:    General: Skin is warm and dry.  Neurological:     Mental Status: She is alert and oriented to person, place, and time.  Psychiatric:        Behavior: Behavior normal.    BP 110/68    Pulse 82    Temp 97.8 F (36.6 C)    Resp 16    Wt 188 lb 9.6 oz (85.5 kg)    SpO2 95%    BMI 32.37 kg/m  Wt Readings from Last 3 Encounters:  02/25/21 188 lb 9.6 oz (85.5 kg)  12/25/20 182 lb 1.9 oz (82.6 kg)  11/12/20 187 lb (84.8 kg)    Diabetic Foot Exam - Simple   No data filed    Lab Results  Component Value Date   WBC 5.8 02/05/2021   HGB 11.2 (L) 02/05/2021   HCT 36.2 02/05/2021   PLT 124 (L) 02/05/2021   GLUCOSE 112 (H) 02/05/2021   CHOL 124 11/12/2020   TRIG 106.0 11/12/2020   HDL 50.40 11/12/2020   LDLDIRECT 73.0 06/16/2017   LDLCALC 53 11/12/2020   ALT 13 02/05/2021   AST 18 02/05/2021   NA 142 02/05/2021   K 4.3 02/05/2021   CL 101 02/05/2021   CREATININE 1.57 (H) 02/05/2021   BUN 45 (H) 02/05/2021   CO2 33 (H) 02/05/2021   TSH 2.74 11/12/2020   INR 1.0 09/12/2006   HGBA1C 5.5 11/12/2020   MICROALBUR <0.7 02/23/2020    Lab Results  Component Value Date   TSH 2.74 11/12/2020   Lab Results  Component Value Date   WBC 5.8  02/05/2021   HGB 11.2 (L) 02/05/2021   HCT 36.2 02/05/2021   MCV 101.4 (H) 02/05/2021   PLT 124 (L) 02/05/2021   Lab Results  Component Value Date   NA 142 02/05/2021   K 4.3 02/05/2021   CO2 33 (H) 02/05/2021   GLUCOSE 112 (H) 02/05/2021   BUN 45 (H) 02/05/2021   CREATININE 1.57 (H) 02/05/2021   BILITOT 0.6 02/05/2021   ALKPHOS 37 (L) 02/05/2021   AST 18 02/05/2021   ALT 13 02/05/2021   PROT 6.9  02/05/2021   ALBUMIN 4.4 02/05/2021   CALCIUM 10.5 (H) 02/05/2021   ANIONGAP 8 02/05/2021   GFR 26.10 (L) 11/12/2020   Lab Results  Component Value Date   CHOL 124 11/12/2020   Lab Results  Component Value Date   HDL 50.40 11/12/2020   Lab Results  Component Value Date   LDLCALC 53 11/12/2020   Lab Results  Component Value Date   TRIG 106.0 11/12/2020   Lab Results  Component Value Date   CHOLHDL 2 11/12/2020   Lab Results  Component Value Date   HGBA1C 5.5 11/12/2020       Assessment & Plan:   Problem List Items Addressed This Visit     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 Encouraged heart healthy diet such as the DASH diet and exercise as tolerated.       Diabetes type 2, controlled ([place])    hgba1c acceptable, minimize simple carbs. Increase exercise as tolerated. Continue current meds      Back pain    Low back, Encouraged moist heat and gentle stretching as tolerated. May try tylenol and prescription meds as directed and report if symptoms worsen or seek immediate care      Abnormal TSH    monitor      Vitamin D deficiency    Supplement and monitor      Right anterior knee pain - Primary    She denies any injury but notes some swelling and pain. Check xray and referred to ortho for further evaluation      Relevant Orders   Ambulatory referral to Orthopedic Surgery   DG Knee Complete 4 Views Right   Tremor     Notes increasing tremor in hands for about 6 months. Also notes at times she will have involuntary jerking of her hands at times. She is offered a referral to neurology to evaluate but declines. She will let us know if she is ready to proceed       Meds ordered this encounter  Medications   FLUoxetine (PROZAC) 20 MG tablet    Sig: Take 1 tablet (20 mg total) by mouth daily.    Dispense:  30 tablet    Refill:  3    I,[name],acting as a scribe for Penni Homans, MD.,have documented all relevant documentation on the behalf of Penni Homans, MD,as directed by  Penni Homans, MD while in the presence of Penni Homans, MD.   I, Mosie Lukes, MD., personally preformed the services described in this documentation.  All medical record entries made by the scribe were at my direction and in my presence.  I have reviewed the chart and discharge instructions (if applicable) and agree that the record reflects my personal performance and is accurate and complete. 02/25/2021

## 2021-02-25 NOTE — Assessment & Plan Note (Signed)
Hydrate and monitor, follows with [facility]

## 2021-02-25 NOTE — Assessment & Plan Note (Signed)
Well controlled, no changes to meds. Encouraged heart healthy diet such as the DASH diet and exercise as tolerated.  °

## 2021-02-26 ENCOUNTER — Encounter: Payer: Self-pay | Admitting: Family

## 2021-02-26 ENCOUNTER — Inpatient Hospital Stay: Payer: Medicare HMO | Admitting: Family

## 2021-02-26 ENCOUNTER — Other Ambulatory Visit: Payer: Self-pay

## 2021-02-26 ENCOUNTER — Inpatient Hospital Stay: Payer: Medicare HMO | Attending: Family Medicine

## 2021-02-26 ENCOUNTER — Inpatient Hospital Stay: Payer: Medicare HMO

## 2021-02-26 VITALS — BP 105/57 | HR 63 | Temp 97.8°F | Resp 19 | Ht 64.0 in | Wt 188.4 lb

## 2021-02-26 DIAGNOSIS — D509 Iron deficiency anemia, unspecified: Secondary | ICD-10-CM

## 2021-02-26 DIAGNOSIS — D631 Anemia in chronic kidney disease: Secondary | ICD-10-CM

## 2021-02-26 DIAGNOSIS — N183 Chronic kidney disease, stage 3 unspecified: Secondary | ICD-10-CM | POA: Insufficient documentation

## 2021-02-26 DIAGNOSIS — D510 Vitamin B12 deficiency anemia due to intrinsic factor deficiency: Secondary | ICD-10-CM | POA: Diagnosis not present

## 2021-02-26 LAB — CMP (CANCER CENTER ONLY)
ALT: 11 U/L (ref 0–44)
AST: 13 U/L — ABNORMAL LOW (ref 15–41)
Albumin: 3.8 g/dL (ref 3.5–5.0)
Alkaline Phosphatase: 35 U/L — ABNORMAL LOW (ref 38–126)
Anion gap: 6 (ref 5–15)
BUN: 50 mg/dL — ABNORMAL HIGH (ref 8–23)
CO2: 34 mmol/L — ABNORMAL HIGH (ref 22–32)
Calcium: 9.3 mg/dL (ref 8.9–10.3)
Chloride: 102 mmol/L (ref 98–111)
Creatinine: 1.6 mg/dL — ABNORMAL HIGH (ref 0.44–1.00)
GFR, Estimated: 33 mL/min — ABNORMAL LOW (ref >60)
Glucose, Bld: 115 mg/dL — ABNORMAL HIGH (ref 70–99)
Potassium: 4.6 mmol/L (ref 3.5–5.1)
Sodium: 142 mmol/L (ref 135–145)
Total Bilirubin: 0.5 mg/dL (ref 0.3–1.2)
Total Protein: 6.1 g/dL — ABNORMAL LOW (ref 6.5–8.1)

## 2021-02-26 LAB — CBC WITH DIFFERENTIAL (CANCER CENTER ONLY)
Abs Immature Granulocytes: 0.03 10*3/uL (ref 0.00–0.07)
Basophils Absolute: 0 10*3/uL (ref 0.0–0.1)
Basophils Relative: 1 %
Eosinophils Absolute: 0.2 10*3/uL (ref 0.0–0.5)
Eosinophils Relative: 4 %
HCT: 30.8 % — ABNORMAL LOW (ref 36.0–46.0)
Hemoglobin: 9.5 g/dL — ABNORMAL LOW (ref 12.0–15.0)
Immature Granulocytes: 1 %
Lymphocytes Relative: 18 %
Lymphs Abs: 1 10*3/uL (ref 0.7–4.0)
MCH: 31.6 pg (ref 26.0–34.0)
MCHC: 30.8 g/dL (ref 30.0–36.0)
MCV: 102.3 fL — ABNORMAL HIGH (ref 80.0–100.0)
Monocytes Absolute: 0.6 10*3/uL (ref 0.1–1.0)
Monocytes Relative: 11 %
Neutro Abs: 3.6 10*3/uL (ref 1.7–7.7)
Neutrophils Relative %: 65 %
Platelet Count: 94 10*3/uL — ABNORMAL LOW (ref 150–400)
RBC: 3.01 MIL/uL — ABNORMAL LOW (ref 3.87–5.11)
RDW: 14.7 % (ref 11.5–15.5)
WBC Count: 5.4 10*3/uL (ref 4.0–10.5)
nRBC: 0 % (ref 0.0–0.2)

## 2021-02-26 LAB — VITAMIN B12: Vitamin B-12: 1007 pg/mL — ABNORMAL HIGH (ref 180–914)

## 2021-02-26 LAB — FOLATE: Folate: 20.9 ng/mL (ref >5.9)

## 2021-02-26 MED ORDER — EPOETIN ALFA-EPBX 40000 UNIT/ML IJ SOLN
40000.0000 [IU] | Freq: Once | INTRAMUSCULAR | Status: AC
  Administered 2021-02-26: 40000 [IU] via SUBCUTANEOUS
  Filled 2021-02-26: qty 1

## 2021-02-26 NOTE — Progress Notes (Signed)
Hematology and Oncology Follow Up Visit  [name] [id number] 1940/02/01 81 y.o. 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 Hgb is 9.5.  She has not noted any obvious blood loss. No bruising or petechiae.  She does note fatigue.  She states that her energy and SOB with exertion are stable. She is doing well on 2L supplemental O2 24 hours a day.  No fever, chills, n/v, cough, rash, dizziness, chest pain, palpitations, abdominal pain or changes in bowel or bladder habits.  Swelling in her lower extremities is stable and unchanged from baseline.  No falls or syncope to report. She is using her Rolator when ambulating for added support.  She has maintained a good appetite and is staying well hydrated. Her weight is stable at 188 lbs.   ECOG Performance Status: 1 - Symptomatic but completely ambulatory  Medications:  Allergies as of 02/26/2021       Reactions   Montelukast Sodium Palpitations, Other (See Comments)   REACTION: HEART PALPITATIONS, CHEST PAIN.   Sulfa Antibiotics Other (See Comments)   CHEST PAIN   Sulfonamide Derivatives Other (See Comments)   CHEST PAIN   Ace Inhibitors Other (See Comments)   PT. STATED UNKNOWN REACTION   Indomethacin Other (See Comments)   PT. STATED UNKNOWN REACTION        Medication List        Accurate as of February 26, 2021  2:19 PM. If you have any questions, ask your nurse or doctor.          Accu-Chek Aviva Soln USE AS DIRECTED WITH GLUCOMETER   Accu-Chek Guide w/Device Kit USE AS DIRECTED TO CHECK BLOOD SUGAR   Accu-Chek Softclix Lancets lancets CHECK BLOOD SUGAR ONE TIME WEEKLY   acetaminophen 500 MG tablet Commonly known as: TYLENOL Take 500 mg by mouth 2 (two) times daily.   albuterol 108 (90 Base) MCG/ACT  inhaler Commonly known as: VENTOLIN HFA Inhale 2 puffs into the lungs every 4 (four) hours as needed for wheezing or shortness of breath.   albuterol 0.63 MG/3ML nebulizer solution Commonly known as: ACCUNEB Take 3 mLs (0.63 mg total) by nebulization every 6 (six) hours as needed for wheezing or shortness of breath.   albuterol (2.5 MG/3ML) 0.083% nebulizer solution Commonly known as: PROVENTIL   Alcohol Wipes 70 % Pads To use before checking blood sugars   allopurinol 100 MG tablet Commonly known as: ZYLOPRIM TAKE 2 TABLETS ONE TIME DAILY   b complex vitamins tablet Take 1 tablet by mouth daily.   bacitracin ointment Apply 1 application topically 2 (two) times daily.   blood glucose meter kit and supplies Check blood sugars once weekly.   docusate sodium 100 MG capsule Commonly known as: COLACE Take 1 capsule (100 mg total) by mouth 2 (two) times daily.   Ferrous Fumarate-Folic Acid 220-2 MG Tabs Commonly known as: Hemocyte-F Take 1 tablet by mouth daily.   FLUoxetine 20 MG tablet Commonly known as: PROZAC Take 1 tablet (20 mg total) by mouth daily.   fluticasone 50 MCG/ACT nasal spray Commonly known as: FLONASE Place 2 sprays into both nostrils daily.   furosemide 40 MG tablet Commonly known as: LASIX TAKE 1 TABLET DAILY AND A SECOND TABLET DAILY AS  NEEDED FOR INCREASED EDEMA OR WEIGHT GAIN GREATER THAN 3 POUNDS IN 24 HOURS   loratadine 10 MG tablet Commonly known as: CLARITIN Take 1 tablet (10 mg total) by mouth at bedtime. What changed:  when to take this reasons to take this   metoprolol succinate 50 MG 24 hr tablet Commonly known as: TOPROL-XL TAKE 1 AND 1/2 TABLETS EVERY DAY WITH OR IMMEDIATELY FOLLOWING A MEAL   montelukast 10 MG tablet Commonly known as: SINGULAIR Take 1 tablet (10 mg total) by mouth at bedtime.   mupirocin ointment 2 % Commonly known as: BACTROBAN Apply 1 application topically 2 (two) times daily.   OXYGEN Inhale 2 L into  the lungs continuous.   pantoprazole 40 MG tablet Commonly known as: PROTONIX Take 40 mg by mouth 2 (two) times daily.   polyethylene glycol 17 g packet Commonly known as: MIRALAX / GLYCOLAX Take 17 g by mouth daily.   pravastatin 40 MG tablet Commonly known as: PRAVACHOL Take 1 tablet (40 mg total) by mouth daily.   PROBIOTIC PO Take 1 tablet by mouth daily.   Symbicort 160-4.5 MCG/ACT inhaler Generic drug: budesonide-formoterol INHALE 2 PUFFS TWICE DAILY IN THE MORNING  AND AT BEDTIME   traMADol 50 MG tablet Commonly known as: ULTRAM TAKE 1 TABLET BY MOUTH THREE TIMES DAILY AS NEEDED FOR MODERATE PAIN   trolamine salicylate 10 % cream Commonly known as: ASPERCREME Apply 1 application topically as needed for muscle pain.   vitamin B-12 1000 MCG tablet Commonly known as: CYANOCOBALAMIN Take 1,000 mcg by mouth daily.        Allergies:  Allergies  Allergen Reactions   Montelukast Sodium Palpitations and Other (See Comments)    REACTION: HEART PALPITATIONS, CHEST PAIN.   Sulfa Antibiotics Other (See Comments)    CHEST PAIN   Sulfonamide Derivatives Other (See Comments)    CHEST PAIN   Ace Inhibitors Other (See Comments)    PT. STATED UNKNOWN REACTION   Indomethacin Other (See Comments)    PT. STATED UNKNOWN REACTION    Past Medical History, Surgical history, Social history, and Family History were reviewed and updated.  Review of Systems: All other 10 point review of systems is negative.   Physical Exam:  vitals were not taken for this visit.   Wt Readings from Last 3 Encounters:  02/25/21 188 lb 9.6 oz (85.5 kg)  12/25/20 182 lb 1.9 oz (82.6 kg)  11/12/20 187 lb (84.8 kg)    Ocular: Sclerae unicteric, pupils equal, round and reactive to light Ear-nose-throat: Oropharynx clear, dentition fair Lymphatic: No cervical or supraclavicular adenopathy Lungs no rales or rhonchi, good excursion bilaterally Heart regular rate and rhythm, no murmur  appreciated Abd soft, nontender, positive bowel sounds MSK no focal spinal tenderness, no joint edema Neuro: non-focal, well-oriented, appropriate affect Breasts: Deferred   Lab Results  Component Value Date   WBC 5.4 02/26/2021   HGB 9.5 (L) 02/26/2021   HCT 30.8 (L) 02/26/2021   MCV 102.3 (H) 02/26/2021   PLT 94 (L) 02/26/2021   Lab Results  Component Value Date   FERRITIN 424 (H) 12/25/2020   IRON 103 12/25/2020   TIBC 226 (L) 12/25/2020   UIBC 123 12/25/2020   IRONPCTSAT 46 12/25/2020   Lab Results  Component Value Date   RETICCTPCT 0.9 12/25/2020   RBC 3.01 (L) 02/26/2021   No results found for: KPAFRELGTCHN, LAMBDASER, KAPLAMBRATIO No results found for: IGGSERUM, IGA, IGMSERUM No results found for: TOTALPROTELP, ALBUMINELP, A1GS, A2GS, BETS, BETA2SER,  Danise Edge, SPEI   Chemistry      Component Value Date/Time   NA 142 02/05/2021 1104   NA 143 01/27/2020 1535   K 4.3 02/05/2021 1104   CL 101 02/05/2021 1104   CO2 33 (H) 02/05/2021 1104   BUN 45 (H) 02/05/2021 1104   BUN 40 (H) 01/27/2020 1535   CREATININE 1.57 (H) 02/05/2021 1104   CREATININE 1.36 (H) 08/23/2013 1134      Component Value Date/Time   CALCIUM 10.5 (H) 02/05/2021 1104   ALKPHOS 37 (L) 02/05/2021 1104   AST 18 02/05/2021 1104   ALT 13 02/05/2021 1104   BILITOT 0.6 02/05/2021 1104       Impression and Plan: Ms. Leopard is a very pleasant 81 yo caucasian female with multifactorial anemia.  ESA given today for Hgb 9.8.  Iron studies are pending.  Lab and injection every 3 weeks and follow-up in 9 weeks.   Lottie Dawson, NP 2/7/20232:19 PM

## 2021-02-26 NOTE — Patient Instructions (Signed)
Epoetin Alfa injection °What is this medication? °EPOETIN ALFA (e POE e tin AL fa) helps your body make more red blood cells. This medicine is used to treat anemia caused by chronic kidney disease, cancer chemotherapy, or HIV-therapy. It may also be used before surgery if you have anemia. °This medicine may be used for other purposes; ask your health care provider or pharmacist if you have questions. °COMMON BRAND NAME(S): Epogen, Procrit, Retacrit °What should I tell my care team before I take this medication? °They need to know if you have any of these conditions: °cancer °heart disease °high blood pressure °history of blood clots °history of stroke °low levels of folate, iron, or vitamin B12 in the blood °seizures °an unusual or allergic reaction to erythropoietin, albumin, benzyl alcohol, hamster proteins, other medicines, foods, dyes, or preservatives °pregnant or trying to get pregnant °breast-feeding °How should I use this medication? °This medicine is for injection into a vein or under the skin. It is usually given by a health care professional in a hospital or clinic setting. °If you get this medicine at home, you will be taught how to prepare and give this medicine. Use exactly as directed. Take your medicine at regular intervals. Do not take your medicine more often than directed. °It is important that you put your used needles and syringes in a special sharps container. Do not put them in a trash can. If you do not have a sharps container, call your pharmacist or healthcare provider to get one. °A special MedGuide will be given to you by the pharmacist with each prescription and refill. Be sure to read this information carefully each time. °Talk to your pediatrician regarding the use of this medicine in children. While this drug may be prescribed for selected conditions, precautions do apply. °Overdosage: If you think you have taken too much of this medicine contact a poison control center or emergency  room at once. °NOTE: This medicine is only for you. Do not share this medicine with others. °What if I miss a dose? °If you miss a dose, take it as soon as you can. If it is almost time for your next dose, take only that dose. Do not take double or extra doses. °What may interact with this medication? °Interactions have not been studied. °This list may not describe all possible interactions. Give your health care provider a list of all the medicines, herbs, non-prescription drugs, or dietary supplements you use. Also tell them if you smoke, drink alcohol, or use illegal drugs. Some items may interact with your medicine. °What should I watch for while using this medication? °Your condition will be monitored carefully while you are receiving this medicine. °You may need blood work done while you are taking this medicine. °This medicine may cause a decrease in vitamin B6. You should make sure that you get enough vitamin B6 while you are taking this medicine. Discuss the foods you eat and the vitamins you take with your health care professional. °What side effects may I notice from receiving this medication? °Side effects that you should report to your doctor or health care professional as soon as possible: °allergic reactions like skin rash, itching or hives, swelling of the face, lips, or tongue °seizures °signs and symptoms of a blood clot such as breathing problems; changes in vision; chest pain; severe, sudden headache; pain, swelling, warmth in the leg; trouble speaking; sudden numbness or weakness of the face, arm or leg °signs and symptoms of a stroke like   changes in vision; confusion; trouble speaking or understanding; severe headaches; sudden numbness or weakness of the face, arm or leg; trouble walking; dizziness; loss of balance or coordination °Side effects that usually do not require medical attention (report to your doctor or health care professional if they continue or are  bothersome): °chills °cough °dizziness °fever °headaches °joint pain °muscle cramps °muscle pain °nausea, vomiting °pain, redness, or irritation at site where injected °This list may not describe all possible side effects. Call your doctor for medical advice about side effects. You may report side effects to FDA at 1-800-FDA-1088. °Where should I keep my medication? °Keep out of the reach of children. °Store in a refrigerator between 2 and 8 degrees C (36 and 46 degrees F). Do not freeze or shake. Throw away any unused portion if using a single-dose vial. Multi-dose vials can be kept in the refrigerator for up to 21 days after the initial dose. Throw away unused medicine. °NOTE: This sheet is a summary. It may not cover all possible information. If you have questions about this medicine, talk to your doctor, pharmacist, or health care provider. °© 2022 [employer]vier/Gold Standard (2016-09-09 00:00:00) ° °

## 2021-02-27 ENCOUNTER — Other Ambulatory Visit: Payer: Self-pay | Admitting: Family Medicine

## 2021-02-27 LAB — FERRITIN: Ferritin: 445 ng/mL — ABNORMAL HIGH (ref 11–307)

## 2021-02-27 LAB — IRON AND IRON BINDING CAPACITY (CC-WL,HP ONLY)
Iron: 132 ug/dL (ref 28–170)
Saturation Ratios: 52 % — ABNORMAL HIGH (ref 10.4–31.8)
TIBC: 252 ug/dL (ref 250–450)
UIBC: 120 ug/dL — ABNORMAL LOW (ref 148–442)

## 2021-02-27 NOTE — Telephone Encounter (Signed)
Requesting: tramadol 50mg   Contract: 12/23/2016 UDS: 01/02/2017 Last Visit: 02/25/2021 Next Visit: 03/21/2021 Last Refill: 01/15/2021 #90 and 0RF  Please Advise

## 2021-02-28 ENCOUNTER — Other Ambulatory Visit: Payer: Self-pay

## 2021-02-28 ENCOUNTER — Ambulatory Visit: Payer: Medicare HMO | Admitting: Orthopedic Surgery

## 2021-02-28 DIAGNOSIS — M1711 Unilateral primary osteoarthritis, right knee: Secondary | ICD-10-CM

## 2021-03-01 ENCOUNTER — Telehealth: Payer: Self-pay | Admitting: *Deleted

## 2021-03-01 ENCOUNTER — Encounter: Payer: Self-pay | Admitting: Orthopedic Surgery

## 2021-03-01 MED ORDER — BUPIVACAINE HCL 0.25 % IJ SOLN
4.0000 mL | INTRAMUSCULAR | Status: AC | PRN
  Administered 2021-02-28: 4 mL via INTRA_ARTICULAR

## 2021-03-01 MED ORDER — METHYLPREDNISOLONE ACETATE 40 MG/ML IJ SUSP
40.0000 mg | INTRAMUSCULAR | Status: AC | PRN
  Administered 2021-02-28: 40 mg via INTRA_ARTICULAR

## 2021-03-01 MED ORDER — LIDOCAINE HCL 1 % IJ SOLN
5.0000 mL | INTRAMUSCULAR | Status: AC | PRN
  Administered 2021-02-28: 5 mL

## 2021-03-01 NOTE — Telephone Encounter (Signed)
Per 02/26/21 los - called and gave upcoming appointments - confirmed °

## 2021-03-01 NOTE — Progress Notes (Signed)
Office Visit Note   Patient: [name]           Date of Birth: 1940/05/26           MRN: [id number] Visit Date: 02/28/2021 Requested by: Margaret Lukes, MD [address] [place],  [place] [address] PCP: Margaret Lukes, MD  Subjective: Chief Complaint  Patient presents with   Right Knee - Pain    HPI: [name] is a 81 year old patient with right knee pain and right buttock pain.  No recent injury.  She does have a history of falls.  Ambulates with a walker.  Reports radicular type pain up into the buttock.  Denies any numbness and tingling and the pain does not wake her from sleep at night.  She has taken Ultram and Tylenol from her primary care provider.  Unable to do stairs.  She is on O2.              ROS: All systems reviewed are negative as they relate to the chief complaint within the history of present illness.  Patient denies  fevers or chills.   Assessment & Plan: Visit Diagnoses:  1. Arthritis of right knee     Plan: Impression is right knee and hip pain.  I think the hip pain is referred pain from arthritis in her back.  She does have right knee arthritis.  We talked about various modalities and interventions.  Plan for cortisone injection into the knee today.  Could repeat that in 3 to 4 months if it proves helpful.  Follow-Up Instructions: Return if symptoms worsen or fail to improve.   Orders:  No orders of the defined types were placed in this encounter.  No orders of the defined types were placed in this encounter.     Procedures: Large Joint Inj: R knee on 02/28/2021 5:09 PM Indications: diagnostic evaluation, joint swelling and pain Details: 18 G 1.5 in needle, superolateral approach  Arthrogram: No  Medications: 5 mL lidocaine 1 %; 40 mg methylPREDNISolone acetate 40 MG/ML; 4 mL bupivacaine 0.25 % Outcome: tolerated well, no immediate complications Procedure, treatment alternatives, risks and benefits explained, specific risks  discussed. Consent was given by the patient. Immediately prior to procedure a time out was called to verify the correct patient, procedure, equipment, support staff and site/side marked as required. Patient was prepped and draped in the usual sterile fashion.      Clinical Data: No additional findings.  Objective: Vital Signs: There were no vitals taken for this visit.  Physical Exam:   Constitutional: Patient appears well-developed HEENT:  Head: Normocephalic Eyes:EOM are normal Neck: Normal range of motion Cardiovascular: Normal rate Pulmonary/chest: Effort normal Neurologic: Patient is alert Skin: Skin is warm Psychiatric: Patient has normal mood and affect   Ortho Exam: Ortho exam demonstrates no effusion in either knee.  No groin pain on the right on the thin side with internal or external rotation of the hip.  Pedal pulses trace palpable.  Extensor mechanism intact on the right.  No masses lymphadenopathy or skin changes noted in that right knee region.  Range of motion is 5 to 95 degrees.  Medial and lateral joint line tenderness is present  Specialty Comments:  No specialty comments available.  Imaging: No results found.   PMFS History: Patient Active Problem List   Diagnosis Date Noted   Right anterior knee pain 02/25/2021   Tremor 02/25/2021   Skin lesion of right ear 11/12/2020  Myalgia 11/12/2020   Need for prophylactic vaccination with tetanus toxoid alone 07/27/2020   Edema 07/27/2020   Grief 06/30/2020   IDA (iron deficiency anemia) 05/09/2020   Physical deconditioning 02/26/2020   Chronic asthma, mild persistent, uncomplicated 63/87/5643   High serum vitamin B12 11/20/2017   Subarachnoid hemorrhage ([place]) 11/05/2017   Intracranial hemorrhage ([place]) 11/04/2017   Syncope and collapse 11/04/2017   Thrombocytopenia ([place]) 10/16/2017   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   Vitamin D deficiency 06/16/2017   Hypernatremia 12/25/2016    Nonintractable headache 10/26/2016   Leukocytes in urine 10/26/2016   Peripheral arterial disease ([place]) 10/01/2016   Abnormal TSH 09/22/2016   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[place]) 08/04/2016   Heart murmur 04/25/2016   Chronic combined systolic and diastolic CHF (congestive heart failure) ([place]) 04/07/2016   Bronchitis 03/06/2016   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   Bilateral lower extremity edema 08/28/2015   Chronic respiratory failure with hypoxia and hypercapnia ([place]) 06/28/2015   Macular degeneration 04/10/2015   Fall at home, initial encounter 12/04/2014   Hypercalcemia 10/15/2014   Hypoxia 10/15/2014   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[place]) 03/12/2014   Cervical cancer screening 02/28/2014   Eustachian tube dysfunction 01/29/2014   Medicare annual wellness visit, subsequent 12/04/2013   Constipation 06/05/2013   Shortness of breath 03/07/2013   Tachycardia 02/23/2013   Sun-damaged skin 10/24/2012   Lower urinary tract infectious disease 10/24/2012   Back pain 04/07/2011   Allergic rhinitis 05/23/2009   Gout 01/11/2008   VARICOSE VEINS, LOWER EXTREMITIES 06/01/2007   ADJ DISORDER WITH MIXED ANXIETY & DEPRESSED MOOD 03/25/2007   Hyperlipidemia, mixed 12/29/2006   Essential hypertension 12/29/2006   Osteopenia 12/29/2006   Abdominal pain 12/29/2006   Diabetes type 2, controlled ([place]) 12/29/2006   Asthma, moderate persistent 08/21/2006   GERD 08/21/2006   Osteoarthritis 08/21/2006   Phlebitis and thrombophlebitis 08/21/2006   Past Medical History:  Diagnosis Date   Abnormal glucose tolerance test    Abnormal TSH 09/22/2016   ACE-inhibitor cough    Anemia 03/12/2014   Arthritis    Asthma    PFT 02/06/09 FEV1 1.41 (65%), FVC 1.92 (64), FEV1% 74, TLC 3.47 (71%), DLCO 48%, +BD   Atypical chest pain    s/p cath, Normal coronaries, Non ST elevation myocardial infarction, Rt groin pseudoaneurysm   Bacterial vaginosis 03/12/2014   Cellulitis 06/22/2016    Chronic kidney disease (CKD), stage III (moderate) ([place]) 08/04/2016   COPD (chronic obstructive pulmonary disease) (HCC)    Depression    Diastolic heart failure ([place]) 04/07/2016   DVT (deep venous thrombosis) ([place]) 1987   GERD (gastroesophageal reflux disease)    Gout    Hypercalcemia 10/15/2014   Hyperlipidemia    Hypertension    Hypoxia 10/15/2014   Macular degeneration 04/10/2015   Osteopenia 12/29/2006   Qualifier: Diagnosis of  By: Wynona Luna    Pneumonia    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   Renal insufficiency 09/22/2016   Unspecified constipation 06/05/2013   Vitamin D deficiency 01/01/2015    Family History  Problem Relation Age of Onset   Asthma Sister    Hypertension Sister    Hyperlipidemia Sister    Uterine cancer Sister    Coronary artery disease Brother        x2   Arthritis Brother    Lung cancer Brother        smoker   Hypertension Sister    Arthritis Sister    Hyperlipidemia  Sister    Emphysema Sister    COPD Sister        smoker   Heart disease Sister    Hyperlipidemia Sister    Hypertension Sister    Arthritis Sister    Emphysema Brother    Heart disease Brother         smoker   Heart attack Brother    Mental illness Father    Suicidality Father    Heart disease Mother    Hyperlipidemia Mother    Heart attack Mother    Epilepsy Daughter    Hypertension Daughter    Obesity Daughter    COPD Brother        smoker   Lung cancer Brother    Coronary artery disease Other    Colon polyps Sister     Past Surgical History:  Procedure Laterality Date   APPENDECTOMY  1951   TONSILLECTOMY  1950   TUBAL LIGATION  1968   Social History   Occupational History   Occupation: Retired    Fish farm manager: Physicist, medical  Tobacco Use   Smoking status: Never   Smokeless tobacco: Never  Vaping Use   Vaping Use: Never used  Substance and Sexual Activity   Alcohol use: No   Drug use: No   Sexual activity: Never    Comment: lives alone, no dietary  restrictions

## 2021-03-06 NOTE — Progress Notes (Signed)
Cardiology Office Note:    Date:  03/07/2021   ID:  [name], DOB [birth year], MRN [id number]  PCP:  Mosie Lukes, MD  Cardiologist:  Fransico Him, MD   Referring MD: Mosie Lukes, MD   Chief Complaint  Patient presents with   Congestive Heart Failure   Hypertension   Hyperlipidemia    History of Present Illness:    [name] is a 81 y.o. female with a hx of asthma, atypical CP with normal coronary arteries by cath in 2008 in the setting of NSTEMI complicated by right groin pseudoaneurysm, chronic diastolic CHF, GERD, HTN and hyperlipidemia.  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  Last echo showed normal LVF with mild MR and grade I diastolic dysfunction.   She has chronic DOE/SOB related to COPD and her breathing is stable on 2L [place].    She is here today for followup and is doing well.  She has chronic SOB from COPD and is on 2L O2 24/7.  She denies any anginal chest pain or pressure,  PND, orthopnea, palpitations or syncope.  She has chronic LE edema when she has been up on her legs for a while but it is very stable.  She is compliant with her meds and is tolerating meds with no SE.     Past Medical History:  Diagnosis Date   Abnormal glucose tolerance test    Abnormal TSH 09/22/2016   ACE-inhibitor cough    Anemia 03/12/2014   Arthritis    Asthma    PFT 02/06/09 FEV1 1.41 (65%), FVC 1.92 (64), FEV1% 74, TLC 3.47 (71%), DLCO 48%, +BD   Atypical chest pain    s/p cath, Normal coronaries, Non ST elevation myocardial infarction, Rt groin pseudoaneurysm   Bacterial vaginosis 03/12/2014   Cellulitis 06/22/2016   Chronic kidney disease (CKD), stage III (moderate) (HCC) 08/04/2016   COPD (chronic obstructive pulmonary disease) (HCC)    Depression    Diastolic heart failure ([place]) 04/07/2016   DVT (deep venous thrombosis) ([place]) 1987   GERD (gastroesophageal reflux disease)    Gout    Hypercalcemia 10/15/2014   Hyperlipidemia     Hypertension    Hypoxia 10/15/2014   Macular degeneration 04/10/2015   Osteopenia 12/29/2006   Qualifier: Diagnosis of  By: Wynona Luna    Pneumonia    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    albuterol (ACCUNEB) 0.63 MG/3ML nebulizer solution Take 3 mLs (0.63 mg total) by nebulization every 6 (six) hours as needed for wheezing or shortness of breath.   albuterol (PROVENTIL) (2.5 MG/3ML) 0.083% nebulizer solution    albuterol (VENTOLIN HFA) 108 (90 Base) MCG/ACT inhaler Inhale 2 puffs into the lungs every 4 (four) hours as needed for wheezing or shortness of breath.   Alcohol Swabs (ALCOHOL WIPES) 70 % PADS To use before checking blood sugars   allopurinol (ZYLOPRIM) 100 MG tablet TAKE 2 TABLETS ONE TIME DAILY   b complex  vitamins tablet Take 1 tablet by mouth daily.   bacitracin ointment Apply 1 application topically 2 (two) times daily.   Blood Glucose Calibration (ACCU-CHEK AVIVA) SOLN USE AS DIRECTED WITH GLUCOMETER   blood glucose meter kit and supplies Check blood sugars once weekly.   Blood Glucose Monitoring Suppl (ACCU-CHEK GUIDE) w/Device KIT USE AS DIRECTED TO CHECK BLOOD SUGAR   docusate sodium (COLACE) 100 MG capsule Take 1 capsule (100 mg total) by mouth 2 (two) times daily.   Ferrous Fumarate-Folic Acid (HEMOCYTE-F) 324-1 MG TABS Take 1 tablet by mouth daily.   FLUoxetine (PROZAC) 20 MG tablet Take 1 tablet (20 mg total) by mouth daily.   fluticasone (FLONASE) 50 MCG/ACT nasal spray Place 2 sprays into both nostrils daily.   furosemide  (LASIX) 40 MG tablet TAKE 1 TABLET DAILY AND A SECOND TABLET DAILY AS NEEDED FOR INCREASED EDEMA OR WEIGHT GAIN GREATER THAN 3 POUNDS IN 24 HOURS   loratadine (CLARITIN) 10 MG tablet Take 1 tablet (10 mg total) by mouth at bedtime. (Patient taking differently: Take 10 mg by mouth at bedtime as needed for allergies.)   metoprolol succinate (TOPROL-XL) 50 MG 24 hr tablet TAKE 1 AND 1/2 TABLETS EVERY DAY WITH OR IMMEDIATELY FOLLOWING A MEAL   montelukast (SINGULAIR) 10 MG tablet Take 1 tablet (10 mg total) by mouth at bedtime.   mupirocin ointment (BACTROBAN) 2 % Apply 1 application topically 2 (two) times daily.   OXYGEN Inhale 2 L into the lungs continuous.    pantoprazole (PROTONIX) 40 MG tablet Take 40 mg by mouth 2 (two) times daily.   polyethylene glycol (MIRALAX / GLYCOLAX) 17 g packet Take 17 g by mouth daily.   pravastatin (PRAVACHOL) 40 MG tablet Take 1 tablet (40 mg total) by mouth daily.   Probiotic Product (PROBIOTIC PO) Take 1 tablet by mouth daily.    SYMBICORT 160-4.5 MCG/ACT inhaler INHALE 2 PUFFS TWICE DAILY IN THE MORNING  AND AT BEDTIME   traMADol (ULTRAM) 50 MG tablet TAKE 1 TABLET BY MOUTH THREE TIMES DAILY AS NEEDED FOR MODERATE PAIN   trolamine salicylate (ASPERCREME) 10 % cream Apply 1 application topically as needed for muscle pain.   vitamin B-12 (CYANOCOBALAMIN) 1000 MCG tablet Take 1,000 mcg by mouth daily.      Allergies:   Montelukast sodium, Sulfa antibiotics, Sulfonamide derivatives, Ace inhibitors, and Indomethacin   Social History   Socioeconomic History   Marital status: Widowed    Spouse name: Not on file   Number of children: 1   Years of education: Not on file   Highest education level: Not on file  Occupational History   Occupation: Retired    Fish farm manager: [employer]  Tobacco Use   Smoking status: Never   Smokeless tobacco: Never  Vaping Use   Vaping Use: Never used  Substance and Sexual Activity   Alcohol use: No   Drug use: No   Sexual activity:  Never    Comment: lives alone, no dietary restrictions  Other Topics Concern   Not on file  Social History Narrative   Not on file   Social Determinants of Health   Financial Resource Strain: Low Risk    Difficulty of Paying Living Expenses: Not hard at all  Food Insecurity: No Food Insecurity   Worried About Charity fundraiser in the Last Year: Never true   [place] in the Last Year: Never true  Transportation Needs: No Transportation Needs   Lack of Transportation (Medical):  No   Lack of Transportation (Non-Medical): No  Physical Activity: Inactive   Days of Exercise per Week: 0 days   Minutes of Exercise per Session: 0 min  Stress: No Stress Concern Present   Feeling of Stress : Not at all  Social Connections: Socially Isolated   Frequency of Communication with Friends and Family: More than three times a week   Frequency of Social Gatherings with Friends and Family: More than three times a week   Attends Religious Services: Never   Marine scientist or Organizations: No   Attends Archivist Meetings: Never   Marital Status: Widowed     Family History: The patient's family history includes Arthritis in her brother, sister, and sister; Asthma in her sister; COPD in her brother and sister; Colon polyps in her sister; Coronary artery disease in her brother and another family member; Emphysema in her brother and sister; Epilepsy in her daughter; Heart attack in her brother and mother; Heart disease in her brother, mother, and sister; Hyperlipidemia in her mother, sister, sister, and sister; Hypertension in her daughter, sister, sister, and sister; Lung cancer in her brother and brother; Mental illness in her father; Obesity in her daughter; Suicidality in her father; Uterine cancer in her sister.  ROS:   Please see the history of present illness.    Review of Systems  Skin:  Negative for unusual hair distribution.  Allergic/Immunologic: Negative for  environmental allergies.   All other systems reviewed and negative.   EKGs/Labs/Other Studies Reviewed:    The following studies were reviewed today: none  EKG:  EKG is  ordered today.  The ekg ordered today demonstrates NSR with PACs  Recent Labs: 11/12/2020: Magnesium 1.7; TSH 2.74 02/26/2021: ALT 11; BUN 50; Creatinine 1.60; Hemoglobin 9.5; Platelet Count 94; Potassium 4.6; Sodium 142   Recent Lipid Panel    Component Value Date/Time   CHOL 124 11/12/2020 1628   CHOL 151 07/27/2019 1701   TRIG 106.0 11/12/2020 1628   TRIG 122 01/27/2006 1037   HDL 50.40 11/12/2020 1628   HDL 47 07/27/2019 1701   CHOLHDL 2 11/12/2020 1628   VLDL 21.2 11/12/2020 1628   LDLCALC 53 11/12/2020 1628   LDLCALC 75 07/27/2019 1701   LDLDIRECT 73.0 06/16/2017 1543    Physical Exam:    VS:  BP (!) 110/52    Pulse 76    Ht $R'5\' 4"'mt$  (1.626 m)    Wt 183 lb (83 kg)    SpO2 100%    BMI 31.41 kg/m     Wt Readings from Last 3 Encounters:  03/07/21 183 lb (83 kg)  02/26/21 188 lb 6.4 oz (85.5 kg)  02/25/21 188 lb 9.6 oz (85.5 kg)     GEN: Well nourished, well developed in no acute distress HEENT: Normal NECK: No JVD; No carotid bruits LYMPHATICS: No lymphadenopathy CARDIAC:RRR, no murmurs, rubs, gallops RESPIRATORY:  few scattered expiratory wheezes ABDOMEN: Soft, non-tender, non-distended MUSCULOSKELETAL:  trace edema; No deformity  SKIN: Warm and dry NEUROLOGIC:  Alert and oriented x 3 PSYCHIATRIC:  Normal affect    ASSESSMENT:    1. Chronic diastolic (congestive) heart failure ([place])   2. Essential hypertension   3. Syncope and collapse   4. Peripheral arterial disease ([place])   5. Hyperlipidemia, mixed   6. Stage 3a chronic kidney disease ([place])     PLAN:    In order of problems listed above:  1.  Chronic diastolic CHF -she does not appear volume  overloaded on exam today -she has chronic DOE related to COPD but her shortness of breath and lower extremity edema are stable -I have  personally reviewed and interpreted outside labs performed by patient's PCP which showed SCR 1.57, K+ 4.3 on 02/05/2021  -Continue prescription drug management Lasix 40 mg daily with as needed refills  2.  HTN -BP is well controlled on exam today -Continue prescription drug management with Toprol-XL 50 mg daily with as needed refills  3.  Syncope -she has not had any further dizziness or syncope  4. PAD -Doppler 07/2016 showed greater than 50% bilateral external iliac artery stenosis, 50 to 74% bilateral CFA stenosis, 50 to 74% right distal SFA stenosis with three-vessel runoff bilaterally. -See by Dr. Gwenlyn Found and felt that patient's LE pain was not due to arterial insufficiency  5.  HLD -LDL goal < 70 -I have personally reviewed and interpreted outside labs performed by patient's PCP which showed LDL 53, HDL 50, triglycerides 106 on 11/12/2020 -Continue prescription drug management pravastatin 40 mg daily with as needed refills  6.  CKD stage 3a -followed by nephrology -renal is managing diuretics  Followup with me in 1 year  Medication Adjustments/Labs and Tests Ordered: Current medicines are reviewed at length with the patient today.  Concerns regarding medicines are outlined above.  Orders Placed This Encounter  Procedures   EKG 12-Lead   No orders of the defined types were placed in this encounter.   Signed, Fransico Him, MD  03/07/2021 9:26 AM    [place]

## 2021-03-07 ENCOUNTER — Encounter: Payer: Self-pay | Admitting: Cardiology

## 2021-03-07 ENCOUNTER — Ambulatory Visit (INDEPENDENT_AMBULATORY_CARE_PROVIDER_SITE_OTHER): Payer: Medicare HMO | Admitting: Cardiology

## 2021-03-07 ENCOUNTER — Other Ambulatory Visit: Payer: Self-pay

## 2021-03-07 VITALS — BP 110/52 | HR 76 | Ht 64.0 in | Wt 183.0 lb

## 2021-03-07 DIAGNOSIS — N1831 Chronic kidney disease, stage 3a: Secondary | ICD-10-CM | POA: Diagnosis not present

## 2021-03-07 DIAGNOSIS — I5032 Chronic diastolic (congestive) heart failure: Secondary | ICD-10-CM

## 2021-03-07 DIAGNOSIS — R55 Syncope and collapse: Secondary | ICD-10-CM | POA: Diagnosis not present

## 2021-03-07 DIAGNOSIS — I10 Essential (primary) hypertension: Secondary | ICD-10-CM

## 2021-03-07 DIAGNOSIS — J449 Chronic obstructive pulmonary disease, unspecified: Secondary | ICD-10-CM | POA: Diagnosis not present

## 2021-03-07 DIAGNOSIS — I739 Peripheral vascular disease, unspecified: Secondary | ICD-10-CM

## 2021-03-07 DIAGNOSIS — E782 Mixed hyperlipidemia: Secondary | ICD-10-CM | POA: Diagnosis not present

## 2021-03-07 MED ORDER — METOPROLOL SUCCINATE ER 50 MG PO TB24: 50.0000 mg | ORAL_TABLET | Freq: Every day | ORAL | 3 refills | Status: DC

## 2021-03-07 MED ORDER — FUROSEMIDE 40 MG PO TABS: ORAL_TABLET | ORAL | 5 refills | Status: DC

## 2021-03-07 NOTE — Patient Instructions (Signed)

## 2021-03-07 NOTE — Addendum Note (Signed)
Addended by: Molli Barrows on: 03/07/2021 09:40 AM   Modules accepted: Orders

## 2021-03-15 DIAGNOSIS — H524 Presbyopia: Secondary | ICD-10-CM | POA: Diagnosis not present

## 2021-03-16 DIAGNOSIS — R0902 Hypoxemia: Secondary | ICD-10-CM | POA: Diagnosis not present

## 2021-03-16 DIAGNOSIS — J4520 Mild intermittent asthma, uncomplicated: Secondary | ICD-10-CM | POA: Diagnosis not present

## 2021-03-19 ENCOUNTER — Inpatient Hospital Stay: Payer: Medicare HMO

## 2021-03-21 ENCOUNTER — Ambulatory Visit: Payer: Medicare HMO | Admitting: Family Medicine

## 2021-03-21 ENCOUNTER — Other Ambulatory Visit: Payer: Self-pay | Admitting: Family Medicine

## 2021-03-22 ENCOUNTER — Inpatient Hospital Stay: Payer: Medicare HMO | Attending: Family Medicine

## 2021-03-22 ENCOUNTER — Other Ambulatory Visit: Payer: Self-pay | Admitting: Family Medicine

## 2021-03-22 ENCOUNTER — Other Ambulatory Visit: Payer: Self-pay

## 2021-03-22 ENCOUNTER — Inpatient Hospital Stay: Payer: Medicare HMO

## 2021-03-22 VITALS — BP 120/39 | HR 87 | Temp 98.1°F | Resp 18

## 2021-03-22 DIAGNOSIS — D631 Anemia in chronic kidney disease: Secondary | ICD-10-CM

## 2021-03-22 DIAGNOSIS — N183 Chronic kidney disease, stage 3 unspecified: Secondary | ICD-10-CM | POA: Insufficient documentation

## 2021-03-22 DIAGNOSIS — D509 Iron deficiency anemia, unspecified: Secondary | ICD-10-CM

## 2021-03-22 LAB — CBC WITH DIFFERENTIAL (CANCER CENTER ONLY)
Abs Immature Granulocytes: 0.03 10*3/uL (ref 0.00–0.07)
Basophils Absolute: 0 10*3/uL (ref 0.0–0.1)
Basophils Relative: 1 %
Eosinophils Absolute: 0.2 10*3/uL (ref 0.0–0.5)
Eosinophils Relative: 4 %
HCT: 32.8 % — ABNORMAL LOW (ref 36.0–46.0)
Hemoglobin: 10.4 g/dL — ABNORMAL LOW (ref 12.0–15.0)
Immature Granulocytes: 1 %
Lymphocytes Relative: 18 %
Lymphs Abs: 1 10*3/uL (ref 0.7–4.0)
MCH: 31.9 pg (ref 26.0–34.0)
MCHC: 31.7 g/dL (ref 30.0–36.0)
MCV: 100.6 fL — ABNORMAL HIGH (ref 80.0–100.0)
Monocytes Absolute: 0.7 10*3/uL (ref 0.1–1.0)
Monocytes Relative: 12 %
Neutro Abs: 3.6 10*3/uL (ref 1.7–7.7)
Neutrophils Relative %: 64 %
Platelet Count: 115 10*3/uL — ABNORMAL LOW (ref 150–400)
RBC: 3.26 MIL/uL — ABNORMAL LOW (ref 3.87–5.11)
RDW: 14.8 % (ref 11.5–15.5)
WBC Count: 5.6 10*3/uL (ref 4.0–10.5)
nRBC: 0 % (ref 0.0–0.2)

## 2021-03-22 LAB — CMP (CANCER CENTER ONLY)
ALT: 9 U/L (ref 0–44)
AST: 12 U/L — ABNORMAL LOW (ref 15–41)
Albumin: 3.9 g/dL (ref 3.5–5.0)
Alkaline Phosphatase: 35 U/L — ABNORMAL LOW (ref 38–126)
Anion gap: 7 (ref 5–15)
BUN: 53 mg/dL — ABNORMAL HIGH (ref 8–23)
CO2: 34 mmol/L — ABNORMAL HIGH (ref 22–32)
Calcium: 9.4 mg/dL (ref 8.9–10.3)
Chloride: 100 mmol/L (ref 98–111)
Creatinine: 1.73 mg/dL — ABNORMAL HIGH (ref 0.44–1.00)
GFR, Estimated: 30 mL/min — ABNORMAL LOW (ref >60)
Glucose, Bld: 94 mg/dL (ref 70–99)
Potassium: 4.9 mmol/L (ref 3.5–5.1)
Sodium: 141 mmol/L (ref 135–145)
Total Bilirubin: 0.6 mg/dL (ref 0.3–1.2)
Total Protein: 6.4 g/dL — ABNORMAL LOW (ref 6.5–8.1)

## 2021-03-22 MED ORDER — EPOETIN ALFA-EPBX 40000 UNIT/ML IJ SOLN
40000.0000 [IU] | Freq: Once | INTRAMUSCULAR | Status: AC
  Administered 2021-03-22: 40000 [IU] via SUBCUTANEOUS
  Filled 2021-03-22: qty 1

## 2021-03-22 NOTE — Patient Instructions (Signed)
Epoetin Alfa injection °What is this medication? °EPOETIN ALFA (e POE e tin AL fa) helps your body make more red blood cells. This medicine is used to treat anemia caused by chronic kidney disease, cancer chemotherapy, or HIV-therapy. It may also be used before surgery if you have anemia. °This medicine may be used for other purposes; ask your health care provider or pharmacist if you have questions. °COMMON BRAND NAME(S): Epogen, Procrit, Retacrit °What should I tell my care team before I take this medication? °They need to know if you have any of these conditions: °cancer °heart disease °high blood pressure °history of blood clots °history of stroke °low levels of folate, iron, or vitamin B12 in the blood °seizures °an unusual or allergic reaction to erythropoietin, albumin, benzyl alcohol, hamster proteins, other medicines, foods, dyes, or preservatives °pregnant or trying to get pregnant °breast-feeding °How should I use this medication? °This medicine is for injection into a vein or under the skin. It is usually given by a health care professional in a hospital or clinic setting. °If you get this medicine at home, you will be taught how to prepare and give this medicine. Use exactly as directed. Take your medicine at regular intervals. Do not take your medicine more often than directed. °It is important that you put your used needles and syringes in a special sharps container. Do not put them in a trash can. If you do not have a sharps container, call your pharmacist or healthcare provider to get one. °A special MedGuide will be given to you by the pharmacist with each prescription and refill. Be sure to read this information carefully each time. °Talk to your pediatrician regarding the use of this medicine in children. While this drug may be prescribed for selected conditions, precautions do apply. °Overdosage: If you think you have taken too much of this medicine contact a poison control center or emergency  room at once. °NOTE: This medicine is only for you. Do not share this medicine with others. °What if I miss a dose? °If you miss a dose, take it as soon as you can. If it is almost time for your next dose, take only that dose. Do not take double or extra doses. °What may interact with this medication? °Interactions have not been studied. °This list may not describe all possible interactions. Give your health care provider a list of all the medicines, herbs, non-prescription drugs, or dietary supplements you use. Also tell them if you smoke, drink alcohol, or use illegal drugs. Some items may interact with your medicine. °What should I watch for while using this medication? °Your condition will be monitored carefully while you are receiving this medicine. °You may need blood work done while you are taking this medicine. °This medicine may cause a decrease in vitamin B6. You should make sure that you get enough vitamin B6 while you are taking this medicine. Discuss the foods you eat and the vitamins you take with your health care professional. °What side effects may I notice from receiving this medication? °Side effects that you should report to your doctor or health care professional as soon as possible: °allergic reactions like skin rash, itching or hives, swelling of the face, lips, or tongue °seizures °signs and symptoms of a blood clot such as breathing problems; changes in vision; chest pain; severe, sudden headache; pain, swelling, warmth in the leg; trouble speaking; sudden numbness or weakness of the face, arm or leg °signs and symptoms of a stroke like   changes in vision; confusion; trouble speaking or understanding; severe headaches; sudden numbness or weakness of the face, arm or leg; trouble walking; dizziness; loss of balance or coordination °Side effects that usually do not require medical attention (report to your doctor or health care professional if they continue or are  bothersome): °chills °cough °dizziness °fever °headaches °joint pain °muscle cramps °muscle pain °nausea, vomiting °pain, redness, or irritation at site where injected °This list may not describe all possible side effects. Call your doctor for medical advice about side effects. You may report side effects to FDA at 1-800-FDA-1088. °Where should I keep my medication? °Keep out of the reach of children. °Store in a refrigerator between 2 and 8 degrees C (36 and 46 degrees F). Do not freeze or shake. Throw away any unused portion if using a single-dose vial. Multi-dose vials can be kept in the refrigerator for up to 21 days after the initial dose. Throw away unused medicine. °NOTE: This sheet is a summary. It may not cover all possible information. If you have questions about this medicine, talk to your doctor, pharmacist, or health care provider. °© 2022 [employer]vier/Gold Standard (2016-09-09 00:00:00) ° °

## 2021-03-26 ENCOUNTER — Other Ambulatory Visit: Payer: Self-pay

## 2021-03-26 ENCOUNTER — Other Ambulatory Visit: Payer: Self-pay | Admitting: Family Medicine

## 2021-03-26 DIAGNOSIS — M549 Dorsalgia, unspecified: Secondary | ICD-10-CM

## 2021-03-26 MED ORDER — TRAMADOL HCL 50 MG PO TABS: ORAL_TABLET | ORAL | 0 refills | Status: DC

## 2021-03-26 NOTE — Progress Notes (Signed)
Requesting: tramadol '50MG'$  ?Contract: 12/23/2016 ?UDS: 01/02/2017 ?Last Visit: 02/25/2021 ?Next Visit: 06/10/21 ?Last Refill: 02/27/21 ? ?Please Advise  ?

## 2021-03-27 ENCOUNTER — Telehealth: Payer: Self-pay | Admitting: Family Medicine

## 2021-03-27 DIAGNOSIS — M549 Dorsalgia, unspecified: Secondary | ICD-10-CM

## 2021-03-27 MED ORDER — TRAMADOL HCL 50 MG PO TABS: ORAL_TABLET | ORAL | 0 refills | Status: DC

## 2021-03-27 NOTE — Telephone Encounter (Signed)
Pt wanted rx sent to [facility], not [facility].  ? ?Medication: traMADol (ULTRAM) 50 MG tablet  ? ?Has the patient contacted their pharmacy? Yes.   ? ? ?Preferred Pharmacy: [facility], [address]  ?[place], [place] [address]  ?Phone:  [phone]  Fax:  [phone]  ? ? ?

## 2021-03-27 NOTE — Telephone Encounter (Signed)
Rx sent to wrong pharmacy yesterday. I have called and canceled prescription at [facility]. Please send to [facility]. ? ? ?Requesting: tramadol '50MG'$  ?Contract: 12/23/2016 ?UDS: 01/02/2017 ?Last Visit: 02/25/2021 ?Next Visit: 06/10/21 ?Last Refill: 02/27/21 ?  ? ?

## 2021-03-27 NOTE — Telephone Encounter (Signed)
Patient gets tramadol 90 tablets regularly.  Prescription sent ?

## 2021-04-09 ENCOUNTER — Other Ambulatory Visit (HOSPITAL_BASED_OUTPATIENT_CLINIC_OR_DEPARTMENT_OTHER): Payer: Self-pay

## 2021-04-09 ENCOUNTER — Inpatient Hospital Stay: Payer: Medicare HMO

## 2021-04-09 ENCOUNTER — Other Ambulatory Visit: Payer: Self-pay

## 2021-04-09 VITALS — BP 122/57 | HR 70 | Temp 98.1°F | Resp 18

## 2021-04-09 DIAGNOSIS — D631 Anemia in chronic kidney disease: Secondary | ICD-10-CM

## 2021-04-09 DIAGNOSIS — N183 Chronic kidney disease, stage 3 unspecified: Secondary | ICD-10-CM | POA: Diagnosis not present

## 2021-04-09 DIAGNOSIS — D509 Iron deficiency anemia, unspecified: Secondary | ICD-10-CM

## 2021-04-09 LAB — CBC WITH DIFFERENTIAL (CANCER CENTER ONLY)
Abs Immature Granulocytes: 0.01 10*3/uL (ref 0.00–0.07)
Basophils Absolute: 0 10*3/uL (ref 0.0–0.1)
Basophils Relative: 0 %
Eosinophils Absolute: 0.2 10*3/uL (ref 0.0–0.5)
Eosinophils Relative: 5 %
HCT: 33.3 % — ABNORMAL LOW (ref 36.0–46.0)
Hemoglobin: 10.1 g/dL — ABNORMAL LOW (ref 12.0–15.0)
Immature Granulocytes: 0 %
Lymphocytes Relative: 16 %
Lymphs Abs: 0.7 10*3/uL (ref 0.7–4.0)
MCH: 32 pg (ref 26.0–34.0)
MCHC: 30.3 g/dL (ref 30.0–36.0)
MCV: 105.4 fL — ABNORMAL HIGH (ref 80.0–100.0)
Monocytes Absolute: 0.5 10*3/uL (ref 0.1–1.0)
Monocytes Relative: 12 %
Neutro Abs: 3.1 10*3/uL (ref 1.7–7.7)
Neutrophils Relative %: 67 %
Platelet Count: 99 10*3/uL — ABNORMAL LOW (ref 150–400)
RBC: 3.16 MIL/uL — ABNORMAL LOW (ref 3.87–5.11)
RDW: 14.3 % (ref 11.5–15.5)
WBC Count: 4.6 10*3/uL (ref 4.0–10.5)
nRBC: 0 % (ref 0.0–0.2)

## 2021-04-09 LAB — CMP (CANCER CENTER ONLY)
ALT: 8 U/L (ref 0–44)
AST: 12 U/L — ABNORMAL LOW (ref 15–41)
Albumin: 4.2 g/dL (ref 3.5–5.0)
Alkaline Phosphatase: 37 U/L — ABNORMAL LOW (ref 38–126)
Anion gap: 8 (ref 5–15)
BUN: 47 mg/dL — ABNORMAL HIGH (ref 8–23)
CO2: 34 mmol/L — ABNORMAL HIGH (ref 22–32)
Calcium: 9.7 mg/dL (ref 8.9–10.3)
Chloride: 102 mmol/L (ref 98–111)
Creatinine: 1.6 mg/dL — ABNORMAL HIGH (ref 0.44–1.00)
GFR, Estimated: 32 mL/min — ABNORMAL LOW (ref >60)
Glucose, Bld: 116 mg/dL — ABNORMAL HIGH (ref 70–99)
Potassium: 4.9 mmol/L (ref 3.5–5.1)
Sodium: 144 mmol/L (ref 135–145)
Total Bilirubin: 0.5 mg/dL (ref 0.3–1.2)
Total Protein: 6.7 g/dL (ref 6.5–8.1)

## 2021-04-09 MED ORDER — ZOSTER VAC RECOMB ADJUVANTED 50 MCG/0.5ML IM SUSR
INTRAMUSCULAR | 1 refills | Status: DC
  Filled 2021-04-09: qty 1, 1d supply, fill #0

## 2021-04-09 MED ORDER — EPOETIN ALFA-EPBX 40000 UNIT/ML IJ SOLN
40000.0000 [IU] | Freq: Once | INTRAMUSCULAR | Status: AC
  Administered 2021-04-09: 40000 [IU] via SUBCUTANEOUS
  Filled 2021-04-09: qty 1

## 2021-04-09 NOTE — Patient Instructions (Signed)
Epoetin Alfa injection °What is this medication? °EPOETIN ALFA (e POE e tin AL fa) helps your body make more red blood cells. This medicine is used to treat anemia caused by chronic kidney disease, cancer chemotherapy, or HIV-therapy. It may also be used before surgery if you have anemia. °This medicine may be used for other purposes; ask your health care provider or pharmacist if you have questions. °COMMON BRAND NAME(S): Epogen, Procrit, Retacrit °What should I tell my care team before I take this medication? °They need to know if you have any of these conditions: °cancer °heart disease °high blood pressure °history of blood clots °history of stroke °low levels of folate, iron, or vitamin B12 in the blood °seizures °an unusual or allergic reaction to erythropoietin, albumin, benzyl alcohol, hamster proteins, other medicines, foods, dyes, or preservatives °pregnant or trying to get pregnant °breast-feeding °How should I use this medication? °This medicine is for injection into a vein or under the skin. It is usually given by a health care professional in a hospital or clinic setting. °If you get this medicine at home, you will be taught how to prepare and give this medicine. Use exactly as directed. Take your medicine at regular intervals. Do not take your medicine more often than directed. °It is important that you put your used needles and syringes in a special sharps container. Do not put them in a trash can. If you do not have a sharps container, call your pharmacist or healthcare provider to get one. °A special MedGuide will be given to you by the pharmacist with each prescription and refill. Be sure to read this information carefully each time. °Talk to your pediatrician regarding the use of this medicine in children. While this drug may be prescribed for selected conditions, precautions do apply. °Overdosage: If you think you have taken too much of this medicine contact a poison control center or emergency  room at once. °NOTE: This medicine is only for you. Do not share this medicine with others. °What if I miss a dose? °If you miss a dose, take it as soon as you can. If it is almost time for your next dose, take only that dose. Do not take double or extra doses. °What may interact with this medication? °Interactions have not been studied. °This list may not describe all possible interactions. Give your health care provider a list of all the medicines, herbs, non-prescription drugs, or dietary supplements you use. Also tell them if you smoke, drink alcohol, or use illegal drugs. Some items may interact with your medicine. °What should I watch for while using this medication? °Your condition will be monitored carefully while you are receiving this medicine. °You may need blood work done while you are taking this medicine. °This medicine may cause a decrease in vitamin B6. You should make sure that you get enough vitamin B6 while you are taking this medicine. Discuss the foods you eat and the vitamins you take with your health care professional. °What side effects may I notice from receiving this medication? °Side effects that you should report to your doctor or health care professional as soon as possible: °allergic reactions like skin rash, itching or hives, swelling of the face, lips, or tongue °seizures °signs and symptoms of a blood clot such as breathing problems; changes in vision; chest pain; severe, sudden headache; pain, swelling, warmth in the leg; trouble speaking; sudden numbness or weakness of the face, arm or leg °signs and symptoms of a stroke like   changes in vision; confusion; trouble speaking or understanding; severe headaches; sudden numbness or weakness of the face, arm or leg; trouble walking; dizziness; loss of balance or coordination °Side effects that usually do not require medical attention (report to your doctor or health care professional if they continue or are  bothersome): °chills °cough °dizziness °fever °headaches °joint pain °muscle cramps °muscle pain °nausea, vomiting °pain, redness, or irritation at site where injected °This list may not describe all possible side effects. Call your doctor for medical advice about side effects. You may report side effects to FDA at 1-800-FDA-1088. °Where should I keep my medication? °Keep out of the reach of children. °Store in a refrigerator between 2 and 8 degrees C (36 and 46 degrees F). Do not freeze or shake. Throw away any unused portion if using a single-dose vial. Multi-dose vials can be kept in the refrigerator for up to 21 days after the initial dose. Throw away unused medicine. °NOTE: This sheet is a summary. It may not cover all possible information. If you have questions about this medicine, talk to your doctor, pharmacist, or health care provider. °© 2022 [employer]vier/Gold Standard (2016-09-09 00:00:00) ° °

## 2021-04-13 DIAGNOSIS — R0902 Hypoxemia: Secondary | ICD-10-CM | POA: Diagnosis not present

## 2021-04-13 DIAGNOSIS — J4520 Mild intermittent asthma, uncomplicated: Secondary | ICD-10-CM | POA: Diagnosis not present

## 2021-04-30 ENCOUNTER — Other Ambulatory Visit: Payer: Self-pay

## 2021-04-30 ENCOUNTER — Encounter: Payer: Self-pay | Admitting: Family

## 2021-04-30 ENCOUNTER — Inpatient Hospital Stay: Payer: Medicare HMO | Admitting: Family

## 2021-04-30 ENCOUNTER — Inpatient Hospital Stay: Payer: Medicare HMO | Attending: Family Medicine

## 2021-04-30 ENCOUNTER — Inpatient Hospital Stay: Payer: Medicare HMO

## 2021-04-30 VITALS — BP 98/45 | HR 74 | Temp 98.0°F | Resp 20 | Ht 64.0 in | Wt 187.4 lb

## 2021-04-30 DIAGNOSIS — D509 Iron deficiency anemia, unspecified: Secondary | ICD-10-CM

## 2021-04-30 DIAGNOSIS — D631 Anemia in chronic kidney disease: Secondary | ICD-10-CM | POA: Diagnosis not present

## 2021-04-30 DIAGNOSIS — N183 Chronic kidney disease, stage 3 unspecified: Secondary | ICD-10-CM | POA: Diagnosis not present

## 2021-04-30 DIAGNOSIS — N184 Chronic kidney disease, stage 4 (severe): Secondary | ICD-10-CM | POA: Diagnosis not present

## 2021-04-30 LAB — CBC WITH DIFFERENTIAL (CANCER CENTER ONLY)
Abs Immature Granulocytes: 0.06 10*3/uL (ref 0.00–0.07)
Basophils Absolute: 0 10*3/uL (ref 0.0–0.1)
Basophils Relative: 1 %
Eosinophils Absolute: 0.3 10*3/uL (ref 0.0–0.5)
Eosinophils Relative: 5 %
HCT: 35.2 % — ABNORMAL LOW (ref 36.0–46.0)
Hemoglobin: 10.6 g/dL — ABNORMAL LOW (ref 12.0–15.0)
Immature Granulocytes: 1 %
Lymphocytes Relative: 21 %
Lymphs Abs: 1.2 10*3/uL (ref 0.7–4.0)
MCH: 31.2 pg (ref 26.0–34.0)
MCHC: 30.1 g/dL (ref 30.0–36.0)
MCV: 103.5 fL — ABNORMAL HIGH (ref 80.0–100.0)
Monocytes Absolute: 0.6 10*3/uL (ref 0.1–1.0)
Monocytes Relative: 11 %
Neutro Abs: 3.4 10*3/uL (ref 1.7–7.7)
Neutrophils Relative %: 61 %
Platelet Count: 122 10*3/uL — ABNORMAL LOW (ref 150–400)
RBC: 3.4 MIL/uL — ABNORMAL LOW (ref 3.87–5.11)
RDW: 14 % (ref 11.5–15.5)
WBC Count: 5.7 10*3/uL (ref 4.0–10.5)
nRBC: 0 % (ref 0.0–0.2)

## 2021-04-30 LAB — RETICULOCYTES
Immature Retic Fract: 5.8 % (ref 2.3–15.9)
RBC.: 3.36 MIL/uL — ABNORMAL LOW (ref 3.87–5.11)
Retic Count, Absolute: 26.9 10*3/uL (ref 19.0–186.0)
Retic Ct Pct: 0.8 % (ref 0.4–3.1)

## 2021-04-30 LAB — CMP (CANCER CENTER ONLY)
ALT: 11 U/L (ref 0–44)
AST: 21 U/L (ref 15–41)
Albumin: 3.9 g/dL (ref 3.5–5.0)
Alkaline Phosphatase: 39 U/L (ref 38–126)
Anion gap: 6 (ref 5–15)
BUN: 40 mg/dL — ABNORMAL HIGH (ref 8–23)
CO2: 33 mmol/L — ABNORMAL HIGH (ref 22–32)
Calcium: 8.9 mg/dL (ref 8.9–10.3)
Chloride: 101 mmol/L (ref 98–111)
Creatinine: 2.07 mg/dL — ABNORMAL HIGH (ref 0.44–1.00)
GFR, Estimated: 24 mL/min — ABNORMAL LOW (ref >60)
Glucose, Bld: 113 mg/dL — ABNORMAL HIGH (ref 70–99)
Potassium: 5.3 mmol/L — ABNORMAL HIGH (ref 3.5–5.1)
Sodium: 140 mmol/L (ref 135–145)
Total Bilirubin: 0.5 mg/dL (ref 0.3–1.2)
Total Protein: 6.7 g/dL (ref 6.5–8.1)

## 2021-04-30 LAB — IRON AND IRON BINDING CAPACITY (CC-WL,HP ONLY)
Iron: 140 ug/dL (ref 28–170)
Saturation Ratios: 55 % — ABNORMAL HIGH (ref 10.4–31.8)
TIBC: 253 ug/dL (ref 250–450)
UIBC: 113 ug/dL — ABNORMAL LOW (ref 148–442)

## 2021-04-30 MED ORDER — EPOETIN ALFA-EPBX 40000 UNIT/ML IJ SOLN
40000.0000 [IU] | Freq: Once | INTRAMUSCULAR | Status: AC
  Administered 2021-04-30: 40000 [IU] via SUBCUTANEOUS
  Filled 2021-04-30: qty 1

## 2021-04-30 NOTE — Progress Notes (Signed)
?Hematology and Oncology Follow Up Visit ? ?[name] ?[id number] ?29-Mar-1940 81 y.o. ?04/30/2021 ? ? 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  ?Current Therapy:        ?Retacrit 40,000 units SQ as indicated for Hgb < 11 ?  ?Interim History:  [name] is here today for follow-up and injection. Hgb today is 10.6.  ?She states that she is doing well and her energy seems to be good.  ?She has had a few nose bleeds that take a few minutes to stop sue to dry nose with supplemental O2. She has a humidifier to put on the line which helps some.  ?No other blood loss noted. No abnormal bruising, no petechiae.  ?No fever, chills, n/v, cough, rash, dizziness, chest pain, palpitations, abdominal pain or changes in bowel or bladder habits. ?No swelling, tenderness, numbness or tingling in her extremities.  ?No falls or syncope to report. She ambulates with her Rolator for added support.  ?She states that her appetite is good and she is staying well hydrated throughout the day. Her weight is stable at 187 lbs.   ? ?ECOG Performance Status: 1 - Symptomatic but completely ambulatory ? ?Medications:  ?Allergies as of 04/30/2021   ? ?   Reactions  ? Montelukast Sodium Palpitations, Other (See Comments)  ? REACTION: HEART PALPITATIONS, CHEST PAIN.  ? Sulfa Antibiotics Other (See Comments)  ? CHEST PAIN  ? Sulfonamide Derivatives Other (See Comments)  ? CHEST PAIN  ? Ace Inhibitors Other (See Comments)  ? PT. STATED UNKNOWN REACTION  ? Indomethacin Other (See Comments)  ? PT. STATED UNKNOWN REACTION  ? ?  ? ?  ?Medication List  ?  ? ?  ? Accurate as of April 30, 2021  1:17 PM. If you have any questions, ask your nurse or doctor.  ?  ?  ? ?  ? ?Accu-Chek Aviva Soln ?USE AS DIRECTED WITH GLUCOMETER ?  ?Accu-Chek Guide test strip ?Generic drug: glucose blood ?CHECK BLOOD SUGAR ONE TIME WEEKLY ?  ?Accu-Chek Guide w/Device Kit ?USE AS DIRECTED TO CHECK BLOOD SUGAR ?  ?Accu-Chek Softclix  Lancets lancets ?CHECK BLOOD SUGAR ONE TIME WEEKLY ?  ?acetaminophen 500 MG tablet ?Commonly known as: TYLENOL ?Take 500 mg by mouth 2 (two) times daily. ?  ?albuterol 108 (90 Base) MCG/ACT inhaler ?Commonly known as: VENTOLIN HFA ?Inhale 2 puffs into the lungs every 4 (four) hours as needed for wheezing or shortness of breath. ?  ?albuterol (2.5 MG/3ML) 0.083% nebulizer solution ?Commonly known as: PROVENTIL ?  ?albuterol (2.5 MG/3ML) 0.083% nebulizer solution ?Commonly known as: PROVENTIL ?INHALE THE CONTENTS OF 1 VIAL VIA NEBULIZER EVERY 6 HOURS AS NEEDED FOR WHEEZING OR SHORTNESS OF BREATH ?  ?Alcohol Wipes 70 % Pads ?To use before checking blood sugars ?  ?allopurinol 100 MG tablet ?Commonly known as: ZYLOPRIM ?TAKE 2 TABLETS ONE TIME DAILY ?  ?b complex vitamins tablet ?Take 1 tablet by mouth daily. ?  ?bacitracin ointment ?Apply 1 application topically 2 (two) times daily. ?  ?blood glucose meter kit and supplies ?Check blood sugars once weekly. ?  ?docusate sodium 100 MG capsule ?Commonly known as: COLACE ?Take 1 capsule (100 mg total) by mouth 2 (two) times daily. ?  ?Ferrous Fumarate-Folic Acid 863-8 MG Tabs ?Commonly known as: Hemocyte-F ?Take 1 tablet by mouth daily. ?  ?FLUoxetine 20 MG tablet ?Commonly known as: PROZAC ?Take 1 tablet (20 mg total) by mouth daily. ?  ?  fluticasone 50 MCG/ACT nasal spray ?Commonly known as: FLONASE ?Place 2 sprays into both nostrils daily. ?  ?furosemide 40 MG tablet ?Commonly known as: LASIX ?TAKE 1 TABLET DAILY AND A SECOND TABLET DAILY AS NEEDED FOR INCREASED EDEMA OR WEIGHT GAIN GREATER THAN 3 POUNDS IN 24 HOURS ?  ?loratadine 10 MG tablet ?Commonly known as: CLARITIN ?Take 1 tablet (10 mg total) by mouth at bedtime. ?What changed:  ?when to take this ?reasons to take this ?  ?metoprolol succinate 50 MG 24 hr tablet ?Commonly known as: TOPROL-XL ?Take 1 tablet (50 mg total) by mouth daily. Take with or immediately following a meal. ?  ?montelukast 10 MG  tablet ?Commonly known as: SINGULAIR ?Take 1 tablet (10 mg total) by mouth at bedtime. ?  ?mupirocin ointment 2 % ?Commonly known as: BACTROBAN ?Apply 1 application topically 2 (two) times daily. ?  ?OXYGEN ?Inhale 2 L into the lungs continuous. ?  ?pantoprazole 40 MG tablet ?Commonly known as: PROTONIX ?Take 40 mg by mouth 2 (two) times daily. ?  ?polyethylene glycol 17 g packet ?Commonly known as: MIRALAX / GLYCOLAX ?Take 17 g by mouth daily. ?  ?pravastatin 40 MG tablet ?Commonly known as: PRAVACHOL ?TAKE 1 TABLET EVERY DAY ?  ?PROBIOTIC PO ?Take 1 tablet by mouth daily. ?  ?Shingrix injection ?Generic drug: Zoster Vaccine Adjuvanted ?Inject into the muscle. ?  ?Symbicort 160-4.5 MCG/ACT inhaler ?Generic drug: budesonide-formoterol ?INHALE 2 PUFFS TWICE DAILY IN THE MORNING  AND AT BEDTIME ?  ?traMADol 50 MG tablet ?Commonly known as: ULTRAM ?1 po q6h prn ?  ?trolamine salicylate 10 % cream ?Commonly known as: ASPERCREME ?Apply 1 application topically as needed for muscle pain. ?  ?vitamin B-12 1000 MCG tablet ?Commonly known as: CYANOCOBALAMIN ?Take 1,000 mcg by mouth daily. ?  ? ?  ? ? ?Allergies:  ?Allergies  ?Allergen Reactions  ? Montelukast Sodium Palpitations and Other (See Comments)  ?  REACTION: HEART PALPITATIONS, CHEST PAIN.  ? Sulfa Antibiotics Other (See Comments)  ?  CHEST PAIN  ? Sulfonamide Derivatives Other (See Comments)  ?  CHEST PAIN  ? Ace Inhibitors Other (See Comments)  ?  PT. STATED UNKNOWN REACTION  ? Indomethacin Other (See Comments)  ?  PT. STATED UNKNOWN REACTION  ? ? ?Past Medical History, Surgical history, Social history, and Family History were reviewed and updated. ? ?Review of Systems: ?All other 10 point review of systems is negative.  ? ?Physical Exam: ? vitals were not taken for this visit.  ? ?Wt Readings from Last 3 Encounters:  ?03/07/21 183 lb (83 kg)  ?02/26/21 188 lb 6.4 oz (85.5 kg)  ?02/25/21 188 lb 9.6 oz (85.5 kg)  ? ? ?Ocular: Sclerae unicteric, pupils equal, round  and reactive to light ?Ear-nose-throat: Oropharynx clear, dentition fair ?Lymphatic: No cervical or supraclavicular adenopathy ?Lungs no rales or rhonchi, good excursion bilaterally ?Heart regular rate and rhythm, no murmur appreciated ?Abd soft, nontender, positive bowel sounds ?MSK no focal spinal tenderness, no joint edema ?Neuro: non-focal, well-oriented, appropriate affect ?Breasts: Deferred  ? ?Lab Results  ?Component Value Date  ? WBC 4.6 04/09/2021  ? HGB 10.1 (L) 04/09/2021  ? HCT 33.3 (L) 04/09/2021  ? MCV 105.4 (H) 04/09/2021  ? PLT 99 (L) 04/09/2021  ? ?Lab Results  ?Component Value Date  ? FERRITIN 445 (H) 02/26/2021  ? IRON 132 02/26/2021  ? TIBC 252 02/26/2021  ? UIBC 120 (L) 02/26/2021  ? IRONPCTSAT 52 (H) 02/26/2021  ? ?Lab Results  ?  Component Value Date  ? RETICCTPCT 0.9 12/25/2020  ? RBC 3.16 (L) 04/09/2021  ? ?No results found for: KPAFRELGTCHN, LAMBDASER, KAPLAMBRATIO ?No results found for: IGGSERUM, IGA, IGMSERUM ?No results found for: TOTALPROTELP, ALBUMINELP, A1GS, A2GS, BETS, BETA2SER, GAMS, MSPIKE, SPEI ?  Chemistry   ?   ?Component Value Date/Time  ? NA 144 04/09/2021 1327  ? NA 143 01/27/2020 1535  ? K 4.9 04/09/2021 1327  ? CL 102 04/09/2021 1327  ? CO2 34 (H) 04/09/2021 1327  ? BUN 47 (H) 04/09/2021 1327  ? BUN 40 (H) 01/27/2020 1535  ? CREATININE 1.60 (H) 04/09/2021 1327  ? CREATININE 1.36 (H) 08/23/2013 1134  ?    ?Component Value Date/Time  ? CALCIUM 9.7 04/09/2021 1327  ? ALKPHOS 37 (L) 04/09/2021 1327  ? AST 12 (L) 04/09/2021 1327  ? ALT 8 04/09/2021 1327  ? BILITOT 0.5 04/09/2021 1327  ?  ? ? ? ?Impression and Plan: Ms. Matarese is a very pleasant 81 yo caucasian female with multifactorial anemia.  ?ESA given, Hgb 10.6.  ?Iron studies pending.  ?Lab and injection every 3 weeks, follow-up in 9 weeks.  ? ?Lottie Dawson, NP ?4/11/20231:17 PM ? ?

## 2021-04-30 NOTE — Patient Instructions (Signed)
Epoetin Alfa injection °What is this medication? °EPOETIN ALFA (e POE e tin AL fa) helps your body make more red blood cells. This medicine is used to treat anemia caused by chronic kidney disease, cancer chemotherapy, or HIV-therapy. It may also be used before surgery if you have anemia. °This medicine may be used for other purposes; ask your health care provider or pharmacist if you have questions. °COMMON BRAND NAME(S): Epogen, Procrit, Retacrit °What should I tell my care team before I take this medication? °They need to know if you have any of these conditions: °cancer °heart disease °high blood pressure °history of blood clots °history of stroke °low levels of folate, iron, or vitamin B12 in the blood °seizures °an unusual or allergic reaction to erythropoietin, albumin, benzyl alcohol, hamster proteins, other medicines, foods, dyes, or preservatives °pregnant or trying to get pregnant °breast-feeding °How should I use this medication? °This medicine is for injection into a vein or under the skin. It is usually given by a health care professional in a hospital or clinic setting. °If you get this medicine at home, you will be taught how to prepare and give this medicine. Use exactly as directed. Take your medicine at regular intervals. Do not take your medicine more often than directed. °It is important that you put your used needles and syringes in a special sharps container. Do not put them in a trash can. If you do not have a sharps container, call your pharmacist or healthcare provider to get one. °A special MedGuide will be given to you by the pharmacist with each prescription and refill. Be sure to read this information carefully each time. °Talk to your pediatrician regarding the use of this medicine in children. While this drug may be prescribed for selected conditions, precautions do apply. °Overdosage: If you think you have taken too much of this medicine contact a poison control center or emergency  room at once. °NOTE: This medicine is only for you. Do not share this medicine with others. °What if I miss a dose? °If you miss a dose, take it as soon as you can. If it is almost time for your next dose, take only that dose. Do not take double or extra doses. °What may interact with this medication? °Interactions have not been studied. °This list may not describe all possible interactions. Give your health care provider a list of all the medicines, herbs, non-prescription drugs, or dietary supplements you use. Also tell them if you smoke, drink alcohol, or use illegal drugs. Some items may interact with your medicine. °What should I watch for while using this medication? °Your condition will be monitored carefully while you are receiving this medicine. °You may need blood work done while you are taking this medicine. °This medicine may cause a decrease in vitamin B6. You should make sure that you get enough vitamin B6 while you are taking this medicine. Discuss the foods you eat and the vitamins you take with your health care professional. °What side effects may I notice from receiving this medication? °Side effects that you should report to your doctor or health care professional as soon as possible: °allergic reactions like skin rash, itching or hives, swelling of the face, lips, or tongue °seizures °signs and symptoms of a blood clot such as breathing problems; changes in vision; chest pain; severe, sudden headache; pain, swelling, warmth in the leg; trouble speaking; sudden numbness or weakness of the face, arm or leg °signs and symptoms of a stroke like   changes in vision; confusion; trouble speaking or understanding; severe headaches; sudden numbness or weakness of the face, arm or leg; trouble walking; dizziness; loss of balance or coordination °Side effects that usually do not require medical attention (report to your doctor or health care professional if they continue or are  bothersome): °chills °cough °dizziness °fever °headaches °joint pain °muscle cramps °muscle pain °nausea, vomiting °pain, redness, or irritation at site where injected °This list may not describe all possible side effects. Call your doctor for medical advice about side effects. You may report side effects to FDA at 1-800-FDA-1088. °Where should I keep my medication? °Keep out of the reach of children. °Store in a refrigerator between 2 and 8 degrees C (36 and 46 degrees F). Do not freeze or shake. Throw away any unused portion if using a single-dose vial. Multi-dose vials can be kept in the refrigerator for up to 21 days after the initial dose. Throw away unused medicine. °NOTE: This sheet is a summary. It may not cover all possible information. If you have questions about this medicine, talk to your doctor, pharmacist, or health care provider. °© 2022 [employer]vier/Gold Standard (2016-09-09 00:00:00) ° °

## 2021-05-01 ENCOUNTER — Other Ambulatory Visit: Payer: Medicare HMO

## 2021-05-01 ENCOUNTER — Telehealth: Payer: Self-pay | Admitting: *Deleted

## 2021-05-01 ENCOUNTER — Ambulatory Visit: Payer: Medicare HMO

## 2021-05-01 LAB — FERRITIN: Ferritin: 436 ng/mL — ABNORMAL HIGH (ref 11–307)

## 2021-05-01 NOTE — Telephone Encounter (Signed)
Per 04/30/21 los - called and gave upcoming appointments - confirmed ?

## 2021-05-02 DIAGNOSIS — Z961 Presence of intraocular lens: Secondary | ICD-10-CM | POA: Diagnosis not present

## 2021-05-02 DIAGNOSIS — H26492 Other secondary cataract, left eye: Secondary | ICD-10-CM | POA: Diagnosis not present

## 2021-05-02 DIAGNOSIS — H18413 Arcus senilis, bilateral: Secondary | ICD-10-CM | POA: Diagnosis not present

## 2021-05-02 DIAGNOSIS — H26493 Other secondary cataract, bilateral: Secondary | ICD-10-CM | POA: Diagnosis not present

## 2021-05-02 DIAGNOSIS — H02831 Dermatochalasis of right upper eyelid: Secondary | ICD-10-CM | POA: Diagnosis not present

## 2021-05-13 ENCOUNTER — Other Ambulatory Visit: Payer: Self-pay | Admitting: Family Medicine

## 2021-05-13 DIAGNOSIS — M549 Dorsalgia, unspecified: Secondary | ICD-10-CM

## 2021-05-14 DIAGNOSIS — J4520 Mild intermittent asthma, uncomplicated: Secondary | ICD-10-CM | POA: Diagnosis not present

## 2021-05-14 DIAGNOSIS — R0902 Hypoxemia: Secondary | ICD-10-CM | POA: Diagnosis not present

## 2021-05-14 NOTE — Telephone Encounter (Signed)
Requesting: tramadol '50mg'$   ?Contract: 12/23/2016 ?UDS: 12/23/16 ?Last Visit; 02/25/21  ?Next Visit: 06/10/21 w/ Melissa ?Last Refill: 03/27/21 #90 and 0RF ? ?Please Advise ? ?

## 2021-05-16 DIAGNOSIS — H26491 Other secondary cataract, right eye: Secondary | ICD-10-CM | POA: Diagnosis not present

## 2021-05-21 ENCOUNTER — Inpatient Hospital Stay: Payer: Medicare HMO

## 2021-05-21 ENCOUNTER — Inpatient Hospital Stay: Payer: Medicare HMO | Attending: Family Medicine

## 2021-05-21 VITALS — BP 118/82 | HR 80 | Temp 98.4°F | Resp 18

## 2021-05-21 DIAGNOSIS — N183 Chronic kidney disease, stage 3 unspecified: Secondary | ICD-10-CM | POA: Diagnosis not present

## 2021-05-21 DIAGNOSIS — D631 Anemia in chronic kidney disease: Secondary | ICD-10-CM

## 2021-05-21 DIAGNOSIS — D509 Iron deficiency anemia, unspecified: Secondary | ICD-10-CM

## 2021-05-21 LAB — CBC WITH DIFFERENTIAL (CANCER CENTER ONLY)
Abs Immature Granulocytes: 0.02 10*3/uL (ref 0.00–0.07)
Basophils Absolute: 0.1 10*3/uL (ref 0.0–0.1)
Basophils Relative: 1 %
Eosinophils Absolute: 0.3 10*3/uL (ref 0.0–0.5)
Eosinophils Relative: 5 %
HCT: 34.7 % — ABNORMAL LOW (ref 36.0–46.0)
Hemoglobin: 10.7 g/dL — ABNORMAL LOW (ref 12.0–15.0)
Immature Granulocytes: 0 %
Lymphocytes Relative: 20 %
Lymphs Abs: 1.4 10*3/uL (ref 0.7–4.0)
MCH: 31.7 pg (ref 26.0–34.0)
MCHC: 30.8 g/dL (ref 30.0–36.0)
MCV: 102.7 fL — ABNORMAL HIGH (ref 80.0–100.0)
Monocytes Absolute: 0.8 10*3/uL (ref 0.1–1.0)
Monocytes Relative: 12 %
Neutro Abs: 4.2 10*3/uL (ref 1.7–7.7)
Neutrophils Relative %: 62 %
Platelet Count: 123 10*3/uL — ABNORMAL LOW (ref 150–400)
RBC: 3.38 MIL/uL — ABNORMAL LOW (ref 3.87–5.11)
RDW: 14.2 % (ref 11.5–15.5)
WBC Count: 6.8 10*3/uL (ref 4.0–10.5)
nRBC: 0 % (ref 0.0–0.2)

## 2021-05-21 LAB — CMP (CANCER CENTER ONLY)
ALT: 9 U/L (ref 0–44)
AST: 13 U/L — ABNORMAL LOW (ref 15–41)
Albumin: 4.5 g/dL (ref 3.5–5.0)
Alkaline Phosphatase: 40 U/L (ref 38–126)
Anion gap: 7 (ref 5–15)
BUN: 54 mg/dL — ABNORMAL HIGH (ref 8–23)
CO2: 38 mmol/L — ABNORMAL HIGH (ref 22–32)
Calcium: 10.3 mg/dL (ref 8.9–10.3)
Chloride: 100 mmol/L (ref 98–111)
Creatinine: 2.02 mg/dL — ABNORMAL HIGH (ref 0.44–1.00)
GFR, Estimated: 24 mL/min — ABNORMAL LOW (ref >60)
Glucose, Bld: 65 mg/dL — ABNORMAL LOW (ref 70–99)
Potassium: 5 mmol/L (ref 3.5–5.1)
Sodium: 145 mmol/L (ref 135–145)
Total Bilirubin: 0.6 mg/dL (ref 0.3–1.2)
Total Protein: 7 g/dL (ref 6.5–8.1)

## 2021-05-21 MED ORDER — EPOETIN ALFA-EPBX 40000 UNIT/ML IJ SOLN
40000.0000 [IU] | Freq: Once | INTRAMUSCULAR | Status: AC
  Administered 2021-05-21: 40000 [IU] via SUBCUTANEOUS
  Filled 2021-05-21: qty 1

## 2021-05-21 NOTE — Patient Instructions (Signed)
Epoetin Alfa injection ?What is this medication? ?EPOETIN ALFA (e POE e tin AL fa) helps your body make more red blood cells. This medicine is used to treat anemia caused by chronic kidney disease, cancer chemotherapy, or HIV-therapy. It may also be used before surgery if you have anemia. ?This medicine may be used for other purposes; ask your health care provider or pharmacist if you have questions. ?COMMON BRAND NAME(S): Epogen, Procrit, Retacrit ?What should I tell my care team before I take this medication? ?They need to know if you have any of these conditions: ?cancer ?heart disease ?high blood pressure ?history of blood clots ?history of stroke ?low levels of folate, iron, or vitamin B12 in the blood ?seizures ?an unusual or allergic reaction to erythropoietin, albumin, benzyl alcohol, hamster proteins, other medicines, foods, dyes, or preservatives ?pregnant or trying to get pregnant ?breast-feeding ?How should I use this medication? ?This medicine is for injection into a vein or under the skin. It is usually given by a health care professional in a hospital or clinic setting. ?If you get this medicine at home, you will be taught how to prepare and give this medicine. Use exactly as directed. Take your medicine at regular intervals. Do not take your medicine more often than directed. ?It is important that you put your used needles and syringes in a special sharps container. Do not put them in a trash can. If you do not have a sharps container, call your pharmacist or healthcare provider to get one. ?A special MedGuide will be given to you by the pharmacist with each prescription and refill. Be sure to read this information carefully each time. ?Talk to your pediatrician regarding the use of this medicine in children. While this drug may be prescribed for selected conditions, precautions do apply. ?Overdosage: If you think you have taken too much of this medicine contact a poison control center or emergency  room at once. ?NOTE: This medicine is only for you. Do not share this medicine with others. ?What if I miss a dose? ?If you miss a dose, take it as soon as you can. If it is almost time for your next dose, take only that dose. Do not take double or extra doses. ?What may interact with this medication? ?Interactions have not been studied. ?This list may not describe all possible interactions. Give your health care provider a list of all the medicines, herbs, non-prescription drugs, or dietary supplements you use. Also tell them if you smoke, drink alcohol, or use illegal drugs. Some items may interact with your medicine. ?What should I watch for while using this medication? ?Your condition will be monitored carefully while you are receiving this medicine. ?You may need blood work done while you are taking this medicine. ?This medicine may cause a decrease in vitamin B6. You should make sure that you get enough vitamin B6 while you are taking this medicine. Discuss the foods you eat and the vitamins you take with your health care professional. ?What side effects may I notice from receiving this medication? ?Side effects that you should report to your doctor or health care professional as soon as possible: ?allergic reactions like skin rash, itching or hives, swelling of the face, lips, or tongue ?seizures ?signs and symptoms of a blood clot such as breathing problems; changes in vision; chest pain; severe, sudden headache; pain, swelling, warmth in the leg; trouble speaking; sudden numbness or weakness of the face, arm or leg ?signs and symptoms of a stroke like   changes in vision; confusion; trouble speaking or understanding; severe headaches; sudden numbness or weakness of the face, arm or leg; trouble walking; dizziness; loss of balance or coordination ?Side effects that usually do not require medical attention (report to your doctor or health care professional if they continue or are  bothersome): ?chills ?cough ?dizziness ?fever ?headaches ?joint pain ?muscle cramps ?muscle pain ?nausea, vomiting ?pain, redness, or irritation at site where injected ?This list may not describe all possible side effects. Call your doctor for medical advice about side effects. You may report side effects to FDA at 1-800-FDA-1088. ?Where should I keep my medication? ?Keep out of the reach of children. ?Store in a refrigerator between 2 and 8 degrees C (36 and 46 degrees F). Do not freeze or shake. Throw away any unused portion if using a single-dose vial. Multi-dose vials can be kept in the refrigerator for up to 21 days after the initial dose. Throw away unused medicine. ?NOTE: This sheet is a summary. It may not cover all possible information. If you have questions about this medicine, talk to your doctor, pharmacist, or health care provider. ?? 2023 Elsevier/Gold Standard (2016-09-09 00:00:00) ? ?

## 2021-05-26 ENCOUNTER — Other Ambulatory Visit: Payer: Self-pay | Admitting: Family Medicine

## 2021-06-06 NOTE — Telephone Encounter (Signed)
Pt due on or after 07/05/21 for next Prolia injection.

## 2021-06-07 ENCOUNTER — Telehealth: Payer: Self-pay | Admitting: Family Medicine

## 2021-06-07 NOTE — Telephone Encounter (Signed)
Pt called wanting to schedule Prolia Shot visit. Advised pt that we would have run that through her insurance in order to inform her of any potential OOP cost due at the time of the appt.

## 2021-06-10 ENCOUNTER — Ambulatory Visit (INDEPENDENT_AMBULATORY_CARE_PROVIDER_SITE_OTHER): Payer: Medicare HMO | Admitting: Family

## 2021-06-10 ENCOUNTER — Ambulatory Visit: Payer: Medicare HMO | Admitting: Family Medicine

## 2021-06-10 VITALS — BP 103/46 | HR 73 | Temp 98.5°F | Resp 16 | Wt 183.0 lb

## 2021-06-10 DIAGNOSIS — K219 Gastro-esophageal reflux disease without esophagitis: Secondary | ICD-10-CM

## 2021-06-10 DIAGNOSIS — I10 Essential (primary) hypertension: Secondary | ICD-10-CM

## 2021-06-10 DIAGNOSIS — E782 Mixed hyperlipidemia: Secondary | ICD-10-CM

## 2021-06-10 DIAGNOSIS — R609 Edema, unspecified: Secondary | ICD-10-CM | POA: Diagnosis not present

## 2021-06-10 DIAGNOSIS — E118 Type 2 diabetes mellitus with unspecified complications: Secondary | ICD-10-CM | POA: Diagnosis not present

## 2021-06-10 DIAGNOSIS — I5042 Chronic combined systolic (congestive) and diastolic (congestive) heart failure: Secondary | ICD-10-CM

## 2021-06-10 DIAGNOSIS — F4323 Adjustment disorder with mixed anxiety and depressed mood: Secondary | ICD-10-CM

## 2021-06-10 DIAGNOSIS — F4320 Adjustment disorder, unspecified: Secondary | ICD-10-CM | POA: Insufficient documentation

## 2021-06-10 DIAGNOSIS — M109 Gout, unspecified: Secondary | ICD-10-CM

## 2021-06-10 DIAGNOSIS — J4540 Moderate persistent asthma, uncomplicated: Secondary | ICD-10-CM

## 2021-06-10 LAB — COMPREHENSIVE METABOLIC PANEL
ALT: 9 U/L (ref 0–35)
AST: 12 U/L (ref 0–37)
Albumin: 4.1 g/dL (ref 3.5–5.2)
Alkaline Phosphatase: 39 U/L (ref 39–117)
BUN: 58 mg/dL — ABNORMAL HIGH (ref 6–23)
CO2: 38 mEq/L — ABNORMAL HIGH (ref 19–32)
Calcium: 10.2 mg/dL (ref 8.4–10.5)
Chloride: 98 mEq/L (ref 96–112)
Creatinine, Ser: 2.32 mg/dL — ABNORMAL HIGH (ref 0.40–1.20)
GFR: 19.43 mL/min — ABNORMAL LOW (ref >60.00)
Glucose, Bld: 127 mg/dL — ABNORMAL HIGH (ref 70–99)
Potassium: 4.5 mEq/L (ref 3.5–5.1)
Sodium: 142 mEq/L (ref 135–145)
Total Bilirubin: 0.5 mg/dL (ref 0.2–1.2)
Total Protein: 6.4 g/dL (ref 6.0–8.3)

## 2021-06-10 LAB — HEMOGLOBIN A1C: Hgb A1c MFr Bld: 5.4 % (ref 4.6–6.5)

## 2021-06-10 MED ORDER — PANTOPRAZOLE SODIUM 40 MG PO TBEC: 40.0000 mg | DELAYED_RELEASE_TABLET | Freq: Two times a day (BID) | ORAL | 1 refills | Status: DC

## 2021-06-10 MED ORDER — ALLOPURINOL 100 MG PO TABS
ORAL_TABLET | ORAL | 1 refills | Status: DC
Start: 2021-06-10 — End: 2022-02-03

## 2021-06-10 MED ORDER — ALBUTEROL SULFATE (2.5 MG/3ML) 0.083% IN NEBU: INHALATION_SOLUTION | RESPIRATORY_TRACT | 3 refills | Status: DC

## 2021-06-10 MED ORDER — ALBUTEROL SULFATE HFA 108 (90 BASE) MCG/ACT IN AERS: 2.0000 | INHALATION_SPRAY | RESPIRATORY_TRACT | 3 refills | Status: DC | PRN

## 2021-06-10 NOTE — Assessment & Plan Note (Signed)
Lab Results  Component Value Date   CHOL 124 11/12/2020   HDL 50.40 11/12/2020   LDLCALC 53 11/12/2020   LDLDIRECT 73.0 06/16/2017   TRIG 106.0 11/12/2020   CHOLHDL 2 11/12/2020   Stable/at goal on pravastatin. Continue same.

## 2021-06-10 NOTE — Assessment & Plan Note (Signed)
Clinically stable on allopurinol. Continue same.

## 2021-06-10 NOTE — Telephone Encounter (Signed)
Task was already sent to [facility] in this regard. Will call pt when EOB has been received.

## 2021-06-10 NOTE — Assessment & Plan Note (Signed)
Lab Results  Component Value Date   HGBA1C 5.5 11/12/2020   Has been stable/diet controlled. Will update A1C.

## 2021-06-10 NOTE — Assessment & Plan Note (Signed)
BP Readings from Last 3 Encounters:  06/10/21 (!) 103/46  05/21/21 118/82  04/30/21 (!) 98/45

## 2021-06-10 NOTE — Assessment & Plan Note (Signed)
She declined to start prozac. Wishes to continue to work on things on her own.

## 2021-06-10 NOTE — Progress Notes (Signed)
Subjective:   By signing my name below, I, [name], attest that this documentation has been prepared under the direction and in the presence of Margaret Alar, NP 06/10/2021      Patient ID: [name], female    DOB: 1940/01/21, 81 y.o.   MRN: [id number]  Chief Complaint  Patient presents with   Hypertension    Here for follow up   Diabetes    Here for follow up   Back Pain    "Still some pain, not as bad"    HPI Patient is in today for an office visit and 11 week f/u  Gout- She has had no recent episodes of gout. She is requesting for a refill on 100 mg allopurinol.  GERD- She reports her symptoms are stable and she has no recent episodes. She is using 40 mg Protonix.  Blood pressure- Her blood pressure is stable at today's visit. She uses 50 mg metoprolol and is doing well on it.  BP Readings from Last 3 Encounters:  06/10/21 (!) 103/46  05/21/21 118/82  04/30/21 (!) 98/45    Leg swelling- She reports leg swelling and erythema in her two legs. The erythema hasn't worsened over time but she adds it is spreading up her legs. She uses 40 mg lasix to manage the swelling.  Breathing- She has been doing well with Symbicort and albuterol inhalers.   Depression/Anxiety- She never refilled her Prozac medication. Notes that she still gets depressed but is learning to deal with it without medication.   Past Medical History:  Diagnosis Date   Abnormal glucose tolerance test    Abnormal TSH 09/22/2016   ACE-inhibitor cough    Anemia 03/12/2014   Arthritis    Asthma    PFT 02/06/09 FEV1 1.41 (65%), FVC 1.92 (64), FEV1% 74, TLC 3.47 (71%), DLCO 48%, +BD   Atypical chest pain    s/p cath, Normal coronaries, Non ST elevation myocardial infarction, Rt groin pseudoaneurysm   Bacterial vaginosis 03/12/2014   Cellulitis 06/22/2016   Chronic kidney disease (CKD), stage III (moderate) ([place]) 08/04/2016   COPD (chronic obstructive pulmonary disease) (HCC)    Depression     Diastolic heart failure ([name]) 04/07/2016   DVT (deep venous thrombosis) ([place]) 1987   GERD (gastroesophageal reflux disease)    Gout    Hypercalcemia 10/15/2014   Hyperlipidemia    Hypertension    Hypoxia 10/15/2014   Macular degeneration 04/10/2015   Osteopenia 12/29/2006   Qualifier: Diagnosis of  By: Wynona Luna    Pneumonia    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   Renal insufficiency 09/22/2016   Unspecified constipation 06/05/2013   Vitamin D deficiency 01/01/2015    Past Surgical History:  Procedure Laterality Date   [place]    Family History  Problem Relation Age of Onset   Asthma Sister    Hypertension Sister    Hyperlipidemia Sister    Uterine cancer Sister    Coronary artery disease Brother        x2   Arthritis Brother    Lung cancer Brother        smoker   Hypertension Sister    Arthritis Sister    Hyperlipidemia Sister    Emphysema Sister    COPD Sister        smoker   Heart disease Sister    Hyperlipidemia Sister    Hypertension Sister  Arthritis Sister    Emphysema Brother    Heart disease Brother         smoker   Heart attack Brother    Mental illness Father    Suicidality Father    Heart disease Mother    Hyperlipidemia Mother    Heart attack Mother    Epilepsy Daughter    Hypertension Daughter    Obesity Daughter    COPD Brother        smoker   Lung cancer Brother    Coronary artery disease Other    Colon polyps Sister     Social History   Socioeconomic History   Marital status: Widowed    Spouse name: Not on file   Number of children: 1   Years of education: Not on file   Highest education level: Not on file  Occupational History   Occupation: Retired    Fish farm manager: [employer]  Tobacco Use   Smoking status: Never   Smokeless tobacco: Never  Vaping Use   Vaping Use: Never used  Substance and Sexual Activity   Alcohol use: No   Drug use: No   Sexual activity:  Never    Comment: lives alone, no dietary restrictions  Other Topics Concern   Not on file  Social History Narrative   Not on file   Social Determinants of Health   Financial Resource Strain: Low Risk    Difficulty of Paying Living Expenses: Not hard at all  Food Insecurity: No Food Insecurity   Worried About Charity fundraiser in the Last Year: Never true   [place] in the Last Year: Never true  Transportation Needs: No Transportation Needs   Lack of Transportation (Medical): No   Lack of Transportation (Non-Medical): No  Physical Activity: Inactive   Days of Exercise per Week: 0 days   Minutes of Exercise per Session: 0 min  Stress: No Stress Concern Present   Feeling of Stress : Not at all  Social Connections: Socially Isolated   Frequency of Communication with Friends and Family: More than three times a week   Frequency of Social Gatherings with Friends and Family: More than three times a week   Attends Religious Services: Never   Marine scientist or Organizations: No   Attends Archivist Meetings: Never   Marital Status: Widowed  Human resources officer Violence: Not At Risk   Fear of Current or Ex-Partner: No   Emotionally Abused: No   Physically Abused: No   Sexually Abused: No    Outpatient Medications Prior to Visit  Medication Sig Dispense Refill   ACCU-CHEK GUIDE test strip CHECK BLOOD SUGAR ONE TIME WEEKLY 100 strip 1   Accu-Chek Softclix Lancets lancets CHECK BLOOD SUGAR ONE TIME WEEKLY 100 each 1   acetaminophen (TYLENOL) 500 MG tablet Take 500 mg by mouth 2 (two) times daily.      Alcohol Swabs (ALCOHOL WIPES) 70 % PADS To use before checking blood sugars 100 each 1   b complex vitamins tablet Take 1 tablet by mouth daily.     bacitracin ointment Apply 1 application topically 2 (two) times daily. 120 g 0   Blood Glucose Calibration (ACCU-CHEK AVIVA) SOLN USE AS DIRECTED WITH GLUCOMETER 1 each 0   blood glucose meter kit and supplies Check  blood sugars once weekly. 1 each 0   Blood Glucose Monitoring Suppl (ACCU-CHEK GUIDE) w/Device KIT USE AS DIRECTED TO CHECK BLOOD SUGAR 1 kit 1  docusate sodium (COLACE) 100 MG capsule Take 1 capsule (100 mg total) by mouth 2 (two) times daily. 10 capsule 0   fluticasone (FLONASE) 50 MCG/ACT nasal spray Place 2 sprays into both nostrils daily. 48 g 1   furosemide (LASIX) 40 MG tablet TAKE 1 TABLET DAILY AND A SECOND TABLET DAILY AS NEEDED FOR INCREASED EDEMA OR WEIGHT GAIN GREATER THAN 3 POUNDS IN 24 HOURS 100 tablet 5   loratadine (CLARITIN) 10 MG tablet Take 1 tablet (10 mg total) by mouth at bedtime. (Patient taking differently: Take 10 mg by mouth at bedtime as needed for allergies.) 30 tablet 5   metoprolol succinate (TOPROL-XL) 50 MG 24 hr tablet Take 1 tablet (50 mg total) by mouth daily. Take with or immediately following a meal. 90 tablet 3   montelukast (SINGULAIR) 10 MG tablet Take 1 tablet (10 mg total) by mouth at bedtime. 90 tablet 2   mupirocin ointment (BACTROBAN) 2 % Apply 1 application topically 2 (two) times daily. 30 g 0   OXYGEN Inhale 2 L into the lungs continuous.      polyethylene glycol (MIRALAX / GLYCOLAX) 17 g packet Take 17 g by mouth daily. 14 each 0   pravastatin (PRAVACHOL) 40 MG tablet TAKE 1 TABLET EVERY DAY 90 tablet 1   Probiotic Product (PROBIOTIC PO) Take 1 tablet by mouth daily.      sertraline (ZOLOFT) 100 MG tablet TAKE 1 TABLET EVERY DAY 90 tablet 0   SYMBICORT 160-4.5 MCG/ACT inhaler INHALE 2 PUFFS TWICE DAILY IN THE MORNING  AND AT BEDTIME 3 each 1   traMADol (ULTRAM) 50 MG tablet TAKE 1 TABLET BY MOUTH EVERY 6 HOURS AS NEEDED 90 tablet 0   trolamine salicylate (ASPERCREME) 10 % cream Apply 1 application topically as needed for muscle pain.     vitamin B-12 (CYANOCOBALAMIN) 1000 MCG tablet Take 1,000 mcg by mouth daily.      albuterol (PROVENTIL) (2.5 MG/3ML) 0.083% nebulizer solution      albuterol (PROVENTIL) (2.5 MG/3ML) 0.083% nebulizer solution  INHALE THE CONTENTS OF 1 VIAL VIA NEBULIZER EVERY 6 HOURS AS NEEDED FOR WHEEZING OR SHORTNESS OF BREATH 90 mL 0   albuterol (VENTOLIN HFA) 108 (90 Base) MCG/ACT inhaler Inhale 2 puffs into the lungs every 4 (four) hours as needed for wheezing or shortness of breath. 54 g 3   allopurinol (ZYLOPRIM) 100 MG tablet TAKE 2 TABLETS ONE TIME DAILY 180 tablet 1   pantoprazole (PROTONIX) 40 MG tablet Take 40 mg by mouth 2 (two) times daily.     Ferrous Fumarate-Folic Acid (HEMOCYTE-F) 324-1 MG TABS Take 1 tablet by mouth daily. 90 each 1   FLUoxetine (PROZAC) 20 MG tablet Take 1 tablet (20 mg total) by mouth daily. (Patient not taking: Reported on 06/10/2021) 30 tablet 3   No facility-administered medications prior to visit.    Allergies  Allergen Reactions   Montelukast Sodium Palpitations and Other (See Comments)    REACTION: HEART PALPITATIONS, CHEST PAIN.   Sulfa Antibiotics Other (See Comments)    CHEST PAIN   Sulfonamide Derivatives Other (See Comments)    CHEST PAIN   Ace Inhibitors Other (See Comments)    PT. STATED UNKNOWN REACTION   Indomethacin Other (See Comments)    PT. STATED UNKNOWN REACTION    Review of Systems  Respiratory:  Negative for shortness of breath and wheezing.   Cardiovascular:  Positive for leg swelling.  Gastrointestinal:  Negative for heartburn.  Psychiatric/Behavioral:  Positive for depression.  Objective:    Physical Exam Constitutional:      General: She is not in acute distress.    Appearance: Normal appearance. She is not ill-appearing.  HENT:     Head: Normocephalic and atraumatic.     Right Ear: External ear normal.     Left Ear: External ear normal.  Cardiovascular:     Rate and Rhythm: Normal rate and regular rhythm.     Pulses: Normal pulses.     Heart sounds: Normal heart sounds. No murmur heard. Pulmonary:     Effort: Pulmonary effort is normal. No respiratory distress.     Breath sounds: Normal breath sounds. No wheezing or  rhonchi.  Musculoskeletal:     Right lower leg: 2+ Edema present.     Left lower leg: 2+ Edema present.  Skin:    General: Skin is warm and dry.     Comments: Chronic changes of venous insufficiency in LE bilaterally   Neurological:     Mental Status: She is alert and oriented to person, place, and time.  Psychiatric:        Behavior: Behavior normal.        Judgment: Judgment normal.    BP (!) 103/46 (BP Location: Right Arm, Patient Position: Sitting, Cuff Size: Large)   Pulse 73   Temp 98.5 F (36.9 C) (Oral)   Resp 16   Wt 183 lb (83 kg)   SpO2 97%   BMI 31.41 kg/m  Wt Readings from Last 3 Encounters:  06/10/21 183 lb (83 kg)  04/30/21 187 lb 6.4 oz (85 kg)  03/07/21 183 lb (83 kg)     Assessment & Plan:   Problem List Items Addressed This Visit       Unprioritized   Hyperlipidemia, mixed    Lab Results  Component Value Date   CHOL 124 11/12/2020   HDL 50.40 11/12/2020   LDLCALC 53 11/12/2020   LDLDIRECT 73.0 06/16/2017   TRIG 106.0 11/12/2020   CHOLHDL 2 11/12/2020  Stable/at goal on pravastatin. Continue same.       Gout    Clinically stable on allopurinol. Continue same.        GERD    Stable, continue protonix.        Relevant Medications   pantoprazole (PROTONIX) 40 MG tablet   Essential hypertension    BP Readings from Last 3 Encounters:  06/10/21 (!) 103/46  05/21/21 118/82  04/30/21 (!) 98/45        Edema    At baseline. Continue furosemide.        Diabetes type 2, controlled ([place]) - Primary   Relevant Orders   Hemoglobin A1c   Comp Met (CMET)   Chronic combined systolic and diastolic CHF (congestive heart failure) (HCC)    Wt Readings from Last 3 Encounters:  06/10/21 183 lb (83 kg)  04/30/21 187 lb 6.4 oz (85 kg)  03/07/21 183 lb (83 kg)  Weight stable. Continue lasix.       Asthma, moderate persistent    Stable on symbicort and prn albuterol. Continue same.        Relevant Medications   albuterol (PROVENTIL) (2.5  MG/3ML) 0.083% nebulizer solution   albuterol (VENTOLIN HFA) 108 (90 Base) MCG/ACT inhaler     Meds ordered this encounter  Medications   pantoprazole (PROTONIX) 40 MG tablet    Sig: Take 1 tablet (40 mg total) by mouth 2 (two) times daily.    Dispense:  90 tablet  Refill:  1    Order Specific Question:   Supervising Provider    Answer:   Penni Homans A [[id number]]   allopurinol (ZYLOPRIM) 100 MG tablet    Sig: TAKE 2 TABLETS ONE TIME DAILY    Dispense:  180 tablet    Refill:  1    Order Specific Question:   Supervising Provider    Answer:   Penni Homans A [[id number]]   albuterol (PROVENTIL) (2.5 MG/3ML) 0.083% nebulizer solution    Sig: INHALE THE CONTENTS OF 1 VIAL VIA NEBULIZER EVERY 6 HOURS AS NEEDED FOR WHEEZING OR SHORTNESS OF BREATH    Dispense:  90 mL    Refill:  3    Order Specific Question:   Supervising Provider    Answer:   Penni Homans A [[id number]]   albuterol (VENTOLIN HFA) 108 (90 Base) MCG/ACT inhaler    Sig: Inhale 2 puffs into the lungs every 4 (four) hours as needed for wheezing or shortness of breath.    Dispense:  18 g    Refill:  3    Order Specific Question:   Supervising Provider    Answer:   Penni Homans A [[id number]]    I,[name],acting as a scribe for Nance Pear, NP.,have documented all relevant documentation on the behalf of Nance Pear, NP,as directed by  Nance Pear, NP while in the presence of Nance Pear, NP.   I, Margaret Alar, NP, personally preformed the services described in this documentation.  All medical record entries made by the scribe were at my direction and in my presence.  I have reviewed the chart and discharge instructions (if applicable) and agree that the record reflects my personal performance and is accurate and complete. 06/10/2021

## 2021-06-10 NOTE — Assessment & Plan Note (Signed)
Stable on symbicort and prn albuterol. Continue same.

## 2021-06-10 NOTE — Assessment & Plan Note (Signed)
At baseline. Continue furosemide.

## 2021-06-10 NOTE — Assessment & Plan Note (Signed)
Stable, continue protonix

## 2021-06-10 NOTE — Assessment & Plan Note (Signed)
Wt Readings from Last 3 Encounters:  06/10/21 183 lb (83 kg)  04/30/21 187 lb 6.4 oz (85 kg)  03/07/21 183 lb (83 kg)   Weight stable. Continue lasix.

## 2021-06-10 NOTE — Patient Instructions (Signed)
Please compete lab work prior to leaving.

## 2021-06-11 ENCOUNTER — Inpatient Hospital Stay: Payer: Medicare HMO

## 2021-06-11 VITALS — BP 102/84 | HR 71 | Temp 97.9°F

## 2021-06-11 DIAGNOSIS — D509 Iron deficiency anemia, unspecified: Secondary | ICD-10-CM

## 2021-06-11 DIAGNOSIS — N183 Chronic kidney disease, stage 3 unspecified: Secondary | ICD-10-CM | POA: Diagnosis not present

## 2021-06-11 DIAGNOSIS — D631 Anemia in chronic kidney disease: Secondary | ICD-10-CM | POA: Diagnosis not present

## 2021-06-11 LAB — CMP (CANCER CENTER ONLY)
ALT: 8 U/L (ref 0–44)
AST: 12 U/L — ABNORMAL LOW (ref 15–41)
Albumin: 4.2 g/dL (ref 3.5–5.0)
Alkaline Phosphatase: 39 U/L (ref 38–126)
Anion gap: 7 (ref 5–15)
BUN: 57 mg/dL — ABNORMAL HIGH (ref 8–23)
CO2: 36 mmol/L — ABNORMAL HIGH (ref 22–32)
Calcium: 10.1 mg/dL (ref 8.9–10.3)
Chloride: 97 mmol/L — ABNORMAL LOW (ref 98–111)
Creatinine: 2.01 mg/dL — ABNORMAL HIGH (ref 0.44–1.00)
GFR, Estimated: 25 mL/min — ABNORMAL LOW (ref >60)
Glucose, Bld: 97 mg/dL (ref 70–99)
Potassium: 4.4 mmol/L (ref 3.5–5.1)
Sodium: 140 mmol/L (ref 135–145)
Total Bilirubin: 0.4 mg/dL (ref 0.3–1.2)
Total Protein: 6.8 g/dL (ref 6.5–8.1)

## 2021-06-11 LAB — CBC WITH DIFFERENTIAL (CANCER CENTER ONLY)
Abs Immature Granulocytes: 0.01 10*3/uL (ref 0.00–0.07)
Basophils Absolute: 0 10*3/uL (ref 0.0–0.1)
Basophils Relative: 1 %
Eosinophils Absolute: 0.2 10*3/uL (ref 0.0–0.5)
Eosinophils Relative: 5 %
HCT: 33.4 % — ABNORMAL LOW (ref 36.0–46.0)
Hemoglobin: 10.3 g/dL — ABNORMAL LOW (ref 12.0–15.0)
Immature Granulocytes: 0 %
Lymphocytes Relative: 15 %
Lymphs Abs: 0.8 10*3/uL (ref 0.7–4.0)
MCH: 31.2 pg (ref 26.0–34.0)
MCHC: 30.8 g/dL (ref 30.0–36.0)
MCV: 101.2 fL — ABNORMAL HIGH (ref 80.0–100.0)
Monocytes Absolute: 0.6 10*3/uL (ref 0.1–1.0)
Monocytes Relative: 12 %
Neutro Abs: 3.6 10*3/uL (ref 1.7–7.7)
Neutrophils Relative %: 67 %
Platelet Count: 108 10*3/uL — ABNORMAL LOW (ref 150–400)
RBC: 3.3 MIL/uL — ABNORMAL LOW (ref 3.87–5.11)
RDW: 14.1 % (ref 11.5–15.5)
WBC Count: 5.3 10*3/uL (ref 4.0–10.5)
nRBC: 0 % (ref 0.0–0.2)

## 2021-06-11 MED ORDER — EPOETIN ALFA-EPBX 40000 UNIT/ML IJ SOLN
40000.0000 [IU] | Freq: Once | INTRAMUSCULAR | Status: AC
  Administered 2021-06-11: 40000 [IU] via SUBCUTANEOUS
  Filled 2021-06-11: qty 1

## 2021-06-11 NOTE — Patient Instructions (Signed)
[facility]  Discharge Instructions: Thank you for choosing [facility] to provide your oncology and hematology care.   If you have a lab appointment with the [facility], please go directly to the [facility] and check in at the registration area.  Wear comfortable clothing and clothing appropriate for easy access to any Portacath or PICC line.   We strive to give you quality time with your provider. You may need to reschedule your appointment if you arrive late (15 or more minutes).  Arriving late affects you and other patients whose appointments are after yours.  Also, if you miss three or more appointments without notifying the office, you may be dismissed from the clinic at the provider's discretion.      For prescription refill requests, have your pharmacy contact our office and allow 72 hours for refills to be completed.    Today you received the following chemotherapy and/or immunotherapy agents Retacrit.   To help prevent nausea and vomiting after your treatment, we encourage you to take your nausea medication as directed.  BELOW ARE SYMPTOMS THAT SHOULD BE REPORTED IMMEDIATELY: *FEVER GREATER THAN 100.4 F (38 C) OR HIGHER *CHILLS OR SWEATING *NAUSEA AND VOMITING THAT IS NOT CONTROLLED WITH YOUR NAUSEA MEDICATION *UNUSUAL SHORTNESS OF BREATH *UNUSUAL BRUISING OR BLEEDING *URINARY PROBLEMS (pain or burning when urinating, or frequent urination) *BOWEL PROBLEMS (unusual diarrhea, constipation, pain near the anus) TENDERNESS IN MOUTH AND THROAT WITH OR WITHOUT PRESENCE OF ULCERS (sore throat, sores in mouth, or a toothache) UNUSUAL RASH, SWELLING OR PAIN  UNUSUAL VAGINAL DISCHARGE OR ITCHING   Items with * indicate a potential emergency and should be followed up as soon as possible or go to the Emergency Department if any problems should occur.  Please show the CHEMOTHERAPY ALERT CARD or IMMUNOTHERAPY ALERT CARD at check-in to the  Emergency Department and triage nurse. Should you have questions after your visit or need to cancel or reschedule your appointment, please contact [facility]  [phone] and follow the prompts.  Office hours are 8:00 a.m. to 4:30 p.m. Monday - Friday. Please note that voicemails left after 4:00 p.m. may not be returned until the following business day.  We are closed weekends and major holidays. You have access to a nurse at all times for urgent questions. Please call the main number to the clinic [phone] and follow the prompts.  For any non-urgent questions, you may also contact your provider using MyChart. We now offer e-Visits for anyone 110 and older to request care online for non-urgent symptoms. For details visit [email or web address].   Also download the MyChart app! Go to the app store, search "MyChart", open the app, select [facility], and log in with your MyChart username and password.  Due to Covid, a mask is required upon entering the hospital/clinic. If you do not have a mask, one will be given to you upon arrival. For doctor visits, patients may have 1 support person aged 70 or older with them. For treatment visits, patients cannot have anyone with them due to current Covid guidelines and our immunocompromised population.

## 2021-06-12 NOTE — Telephone Encounter (Signed)
Prolia VOB initiated via [email or web address]  Last OV:  Next OV:  Last Prolia inj: 12/05/21 Next Prolia inj DUE: 06/05/21

## 2021-06-13 ENCOUNTER — Telehealth: Payer: Self-pay

## 2021-06-13 DIAGNOSIS — J4520 Mild intermittent asthma, uncomplicated: Secondary | ICD-10-CM | POA: Diagnosis not present

## 2021-06-13 DIAGNOSIS — R0902 Hypoxemia: Secondary | ICD-10-CM | POA: Diagnosis not present

## 2021-06-13 NOTE — Telephone Encounter (Signed)
Looks like she might be calling back in regards to the Prolia.

## 2021-06-13 NOTE — Telephone Encounter (Signed)
Caller Name Lucas Phone Number [phone] Call Type Message Only Information Provided Reason for Call Returning a Call from the Office Initial [place] states she missed a call form the office. Additional Comment Provided office hours. Disp. Time Disposition Final User 06/12/2021 5:59:18 PM General Information Provided Yes Rica Mote Call Closed By: Rica Mote Transaction Date/Time: 06/12/2021 5:57:14 PM (ET)

## 2021-06-14 DIAGNOSIS — H2513 Age-related nuclear cataract, bilateral: Secondary | ICD-10-CM | POA: Diagnosis not present

## 2021-06-18 NOTE — Telephone Encounter (Signed)
Prior auth required for PROLIA ? ?PA PROCESS DETAILS: PA is required. PA can be initiated by calling [phone] or online at ?[email or web address] ? ?

## 2021-06-19 DIAGNOSIS — D631 Anemia in chronic kidney disease: Secondary | ICD-10-CM | POA: Diagnosis not present

## 2021-06-19 DIAGNOSIS — I129 Hypertensive chronic kidney disease with stage 1 through stage 4 chronic kidney disease, or unspecified chronic kidney disease: Secondary | ICD-10-CM | POA: Diagnosis not present

## 2021-06-19 DIAGNOSIS — N184 Chronic kidney disease, stage 4 (severe): Secondary | ICD-10-CM | POA: Diagnosis not present

## 2021-06-19 DIAGNOSIS — E1122 Type 2 diabetes mellitus with diabetic chronic kidney disease: Secondary | ICD-10-CM | POA: Diagnosis not present

## 2021-06-20 NOTE — Telephone Encounter (Signed)
Prior Authorization initiated for PROLIA via [email or web address] KEY: [id number]

## 2021-06-21 DIAGNOSIS — H2513 Age-related nuclear cataract, bilateral: Secondary | ICD-10-CM | POA: Diagnosis not present

## 2021-06-22 NOTE — Telephone Encounter (Signed)
Pt ready for scheduling on or after 06/05/21  Out-of-pocket cost due at time of visit: $301  Primary: [employer] Prolia co-insurance: 20% (approximately $276) Admin fee co-insurance: 20% (approximately $25)  Secondary: n/a Prolia co-insurance:  Admin fee co-insurance:   Deductible: does not apply  Prior Auth: APPROVED PA# [id number]; Key: [id number] Valid: 06/05/21-01/19/22  ** This summary of benefits is an estimation of the patient's out-of-pocket cost. Exact cost may vary based on individual plan coverage.

## 2021-06-22 NOTE — Telephone Encounter (Signed)
PA# [id number]; Key: [id number] Valid: 06/05/21-01/19/22

## 2021-06-25 NOTE — Telephone Encounter (Signed)
Left vm to return call.    

## 2021-07-02 ENCOUNTER — Inpatient Hospital Stay: Payer: Medicare HMO | Admitting: Family

## 2021-07-02 ENCOUNTER — Inpatient Hospital Stay: Payer: Medicare HMO

## 2021-07-03 DIAGNOSIS — C44212 Basal cell carcinoma of skin of right ear and external auricular canal: Secondary | ICD-10-CM | POA: Diagnosis not present

## 2021-07-05 ENCOUNTER — Inpatient Hospital Stay: Payer: Medicare HMO

## 2021-07-05 ENCOUNTER — Inpatient Hospital Stay: Payer: Medicare HMO | Admitting: Family

## 2021-07-05 ENCOUNTER — Inpatient Hospital Stay: Payer: Medicare HMO | Attending: Family Medicine

## 2021-07-05 ENCOUNTER — Encounter: Payer: Self-pay | Admitting: Family

## 2021-07-05 ENCOUNTER — Telehealth: Payer: Self-pay | Admitting: Family Medicine

## 2021-07-05 ENCOUNTER — Other Ambulatory Visit: Payer: Self-pay | Admitting: Oncology

## 2021-07-05 VITALS — BP 127/50 | HR 78 | Temp 98.4°F | Resp 18 | Wt 181.1 lb

## 2021-07-05 DIAGNOSIS — Z79899 Other long term (current) drug therapy: Secondary | ICD-10-CM | POA: Insufficient documentation

## 2021-07-05 DIAGNOSIS — D509 Iron deficiency anemia, unspecified: Secondary | ICD-10-CM

## 2021-07-05 DIAGNOSIS — D631 Anemia in chronic kidney disease: Secondary | ICD-10-CM | POA: Diagnosis not present

## 2021-07-05 DIAGNOSIS — N183 Chronic kidney disease, stage 3 unspecified: Secondary | ICD-10-CM | POA: Diagnosis not present

## 2021-07-05 LAB — CBC WITH DIFFERENTIAL (CANCER CENTER ONLY)
Abs Immature Granulocytes: 0.05 10*3/uL (ref 0.00–0.07)
Basophils Absolute: 0 10*3/uL (ref 0.0–0.1)
Basophils Relative: 0 %
Eosinophils Absolute: 0.2 10*3/uL (ref 0.0–0.5)
Eosinophils Relative: 4 %
HCT: 34.7 % — ABNORMAL LOW (ref 36.0–46.0)
Hemoglobin: 10.4 g/dL — ABNORMAL LOW (ref 12.0–15.0)
Immature Granulocytes: 1 %
Lymphocytes Relative: 19 %
Lymphs Abs: 0.9 10*3/uL (ref 0.7–4.0)
MCH: 30.5 pg (ref 26.0–34.0)
MCHC: 30 g/dL (ref 30.0–36.0)
MCV: 101.8 fL — ABNORMAL HIGH (ref 80.0–100.0)
Monocytes Absolute: 0.6 10*3/uL (ref 0.1–1.0)
Monocytes Relative: 13 %
Neutro Abs: 2.9 10*3/uL (ref 1.7–7.7)
Neutrophils Relative %: 63 %
Platelet Count: 101 10*3/uL — ABNORMAL LOW (ref 150–400)
RBC: 3.41 MIL/uL — ABNORMAL LOW (ref 3.87–5.11)
RDW: 14.6 % (ref 11.5–15.5)
WBC Count: 4.7 10*3/uL (ref 4.0–10.5)
nRBC: 0 % (ref 0.0–0.2)

## 2021-07-05 LAB — CMP (CANCER CENTER ONLY)
ALT: 8 U/L (ref 0–44)
AST: 12 U/L — ABNORMAL LOW (ref 15–41)
Albumin: 4.2 g/dL (ref 3.5–5.0)
Alkaline Phosphatase: 38 U/L (ref 38–126)
Anion gap: 7 (ref 5–15)
BUN: 40 mg/dL — ABNORMAL HIGH (ref 8–23)
CO2: 37 mmol/L — ABNORMAL HIGH (ref 22–32)
Calcium: 10.7 mg/dL — ABNORMAL HIGH (ref 8.9–10.3)
Chloride: 99 mmol/L (ref 98–111)
Creatinine: 1.58 mg/dL — ABNORMAL HIGH (ref 0.44–1.00)
GFR, Estimated: 33 mL/min — ABNORMAL LOW (ref >60)
Glucose, Bld: 111 mg/dL — ABNORMAL HIGH (ref 70–99)
Potassium: 4.5 mmol/L (ref 3.5–5.1)
Sodium: 143 mmol/L (ref 135–145)
Total Bilirubin: 0.5 mg/dL (ref 0.3–1.2)
Total Protein: 6.8 g/dL (ref 6.5–8.1)

## 2021-07-05 LAB — RETICULOCYTES
Immature Retic Fract: 10.2 % (ref 2.3–15.9)
RBC.: 3.34 MIL/uL — ABNORMAL LOW (ref 3.87–5.11)
Retic Count, Absolute: 28.7 10*3/uL (ref 19.0–186.0)
Retic Ct Pct: 0.9 % (ref 0.4–3.1)

## 2021-07-05 LAB — IRON AND IRON BINDING CAPACITY (CC-WL,HP ONLY)
Iron: 84 ug/dL (ref 28–170)
Saturation Ratios: 32 % — ABNORMAL HIGH (ref 10.4–31.8)
TIBC: 263 ug/dL (ref 250–450)
UIBC: 179 ug/dL (ref 148–442)

## 2021-07-05 LAB — FERRITIN: Ferritin: 392 ng/mL — ABNORMAL HIGH (ref 11–307)

## 2021-07-05 MED ORDER — EPOETIN ALFA-EPBX 40000 UNIT/ML IJ SOLN
40000.0000 [IU] | Freq: Once | INTRAMUSCULAR | Status: AC
  Administered 2021-07-05: 40000 [IU] via SUBCUTANEOUS
  Filled 2021-07-05: qty 1

## 2021-07-05 NOTE — Telephone Encounter (Signed)
Pt came in office wanting to schedule a prolia shot ([place] appt) stating that she received letter that her prolia was approved, no letter nor info on pt's chart about prolia. Please verify if pt got approved for prolia and what does pt have to pay. Pt would like to schedule appt if approved. Please advise. Pt tel [phone].

## 2021-07-05 NOTE — Patient Instructions (Signed)
Epoetin Alfa injection ?What is this medication? ?EPOETIN ALFA (e POE e tin AL fa) helps your body make more red blood cells. This medicine is used to treat anemia caused by chronic kidney disease, cancer chemotherapy, or HIV-therapy. It may also be used before surgery if you have anemia. ?This medicine may be used for other purposes; ask your health care provider or pharmacist if you have questions. ?COMMON BRAND NAME(S): Epogen, Procrit, Retacrit ?What should I tell my care team before I take this medication? ?They need to know if you have any of these conditions: ?cancer ?heart disease ?high blood pressure ?history of blood clots ?history of stroke ?low levels of folate, iron, or vitamin B12 in the blood ?seizures ?an unusual or allergic reaction to erythropoietin, albumin, benzyl alcohol, hamster proteins, other medicines, foods, dyes, or preservatives ?pregnant or trying to get pregnant ?breast-feeding ?How should I use this medication? ?This medicine is for injection into a vein or under the skin. It is usually given by a health care professional in a hospital or clinic setting. ?If you get this medicine at home, you will be taught how to prepare and give this medicine. Use exactly as directed. Take your medicine at regular intervals. Do not take your medicine more often than directed. ?It is important that you put your used needles and syringes in a special sharps container. Do not put them in a trash can. If you do not have a sharps container, call your pharmacist or healthcare provider to get one. ?A special MedGuide will be given to you by the pharmacist with each prescription and refill. Be sure to read this information carefully each time. ?Talk to your pediatrician regarding the use of this medicine in children. While this drug may be prescribed for selected conditions, precautions do apply. ?Overdosage: If you think you have taken too much of this medicine contact a poison control center or emergency  room at once. ?NOTE: This medicine is only for you. Do not share this medicine with others. ?What if I miss a dose? ?If you miss a dose, take it as soon as you can. If it is almost time for your next dose, take only that dose. Do not take double or extra doses. ?What may interact with this medication? ?Interactions have not been studied. ?This list may not describe all possible interactions. Give your health care provider a list of all the medicines, herbs, non-prescription drugs, or dietary supplements you use. Also tell them if you smoke, drink alcohol, or use illegal drugs. Some items may interact with your medicine. ?What should I watch for while using this medication? ?Your condition will be monitored carefully while you are receiving this medicine. ?You may need blood work done while you are taking this medicine. ?This medicine may cause a decrease in vitamin B6. You should make sure that you get enough vitamin B6 while you are taking this medicine. Discuss the foods you eat and the vitamins you take with your health care professional. ?What side effects may I notice from receiving this medication? ?Side effects that you should report to your doctor or health care professional as soon as possible: ?allergic reactions like skin rash, itching or hives, swelling of the face, lips, or tongue ?seizures ?signs and symptoms of a blood clot such as breathing problems; changes in vision; chest pain; severe, sudden headache; pain, swelling, warmth in the leg; trouble speaking; sudden numbness or weakness of the face, arm or leg ?signs and symptoms of a stroke like   changes in vision; confusion; trouble speaking or understanding; severe headaches; sudden numbness or weakness of the face, arm or leg; trouble walking; dizziness; loss of balance or coordination ?Side effects that usually do not require medical attention (report to your doctor or health care professional if they continue or are  bothersome): ?chills ?cough ?dizziness ?fever ?headaches ?joint pain ?muscle cramps ?muscle pain ?nausea, vomiting ?pain, redness, or irritation at site where injected ?This list may not describe all possible side effects. Call your doctor for medical advice about side effects. You may report side effects to FDA at 1-800-FDA-1088. ?Where should I keep my medication? ?Keep out of the reach of children. ?Store in a refrigerator between 2 and 8 degrees C (36 and 46 degrees F). Do not freeze or shake. Throw away any unused portion if using a single-dose vial. Multi-dose vials can be kept in the refrigerator for up to 21 days after the initial dose. Throw away unused medicine. ?NOTE: This sheet is a summary. It may not cover all possible information. If you have questions about this medicine, talk to your doctor, pharmacist, or health care provider. ?? 2023 Elsevier/Gold Standard (2016-09-09 00:00:00) ? ?

## 2021-07-05 NOTE — Progress Notes (Signed)
Hematology and Oncology Follow Up Visit  [name] [id number] [birth year] 81 y.o. 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 She feels fatigued at times.  She notes that her SOB seems to be a little worse with the heat and humidity.  No fever, chills, n/v, cough, rash, dizziness, chest pain, palpitations, abdominal pain or changes in bowel or bladder habits.  No bleeding, bruising or petechiae noted.  No swelling, tenderness, numbness or tingling in her extremities.   No falls or syncope. She ambulates with her Rolator.  Appetite and hydration have been good. Her weight is 181 lbs.   ECOG Performance Status: 1 - Symptomatic but completely ambulatory  Medications:  Allergies as of 07/05/2021       Reactions   Montelukast Sodium Palpitations, Other (See Comments)   REACTION: HEART PALPITATIONS, CHEST PAIN.   Sulfa Antibiotics Other (See Comments)   CHEST PAIN   Sulfonamide Derivatives Other (See Comments)   CHEST PAIN   Ace Inhibitors Other (See Comments)   PT. STATED UNKNOWN REACTION   Indomethacin Other (See Comments)   PT. STATED UNKNOWN REACTION        Medication List        Accurate as of July 05, 2021 10:41 AM. If you have any questions, ask your nurse or doctor.          Accu-Chek Aviva Soln USE AS DIRECTED WITH GLUCOMETER   Accu-Chek Guide test strip Generic drug: glucose blood CHECK BLOOD SUGAR ONE TIME WEEKLY   Accu-Chek Guide w/Device Kit USE AS DIRECTED TO CHECK BLOOD SUGAR   Accu-Chek Softclix Lancets lancets CHECK BLOOD SUGAR ONE TIME WEEKLY   acetaminophen 500 MG tablet Commonly known as: TYLENOL Take 500 mg by mouth 2 (two) times daily.   albuterol (2.5 MG/3ML) 0.083% nebulizer solution Commonly known as: PROVENTIL INHALE THE  CONTENTS OF 1 VIAL VIA NEBULIZER EVERY 6 HOURS AS NEEDED FOR WHEEZING OR SHORTNESS OF BREATH   albuterol 108 (90 Base) MCG/ACT inhaler Commonly known as: VENTOLIN HFA Inhale 2 puffs into the lungs every 4 (four) hours as needed for wheezing or shortness of breath.   Alcohol Wipes 70 % Pads To use before checking blood sugars   allopurinol 100 MG tablet Commonly known as: ZYLOPRIM TAKE 2 TABLETS ONE TIME DAILY   b complex vitamins tablet Take 1 tablet by mouth daily.   bacitracin ointment Apply 1 application topically 2 (two) times daily.   blood glucose meter kit and supplies Check blood sugars once weekly.   docusate sodium 100 MG capsule Commonly known as: COLACE Take 1 capsule (100 mg total) by mouth 2 (two) times daily.   fluticasone 50 MCG/ACT nasal spray Commonly known as: FLONASE Place 2 sprays into both nostrils daily.   furosemide 40 MG tablet Commonly known as: LASIX TAKE 1 TABLET DAILY AND A SECOND TABLET DAILY AS NEEDED FOR INCREASED EDEMA OR WEIGHT GAIN GREATER THAN 3 POUNDS IN 24 HOURS   loratadine 10 MG tablet Commonly known as: CLARITIN Take 1 tablet (10 mg total) by mouth at bedtime. What changed:  when to take this reasons to take this   metoprolol succinate 50 MG 24 hr tablet Commonly known as: TOPROL-XL Take 1 tablet (50 mg total) by  mouth daily. Take with or immediately following a meal.   montelukast 10 MG tablet Commonly known as: SINGULAIR Take 1 tablet (10 mg total) by mouth at bedtime.   mupirocin ointment 2 % Commonly known as: BACTROBAN Apply 1 application topically 2 (two) times daily.   OXYGEN Inhale 2 L into the lungs continuous.   pantoprazole 40 MG tablet Commonly known as: PROTONIX Take 1 tablet (40 mg total) by mouth 2 (two) times daily.   polyethylene glycol 17 g packet Commonly known as: MIRALAX / GLYCOLAX Take 17 g by mouth daily.   pravastatin 40 MG tablet Commonly known as: PRAVACHOL TAKE 1 TABLET EVERY DAY    PROBIOTIC PO Take 1 tablet by mouth daily.   sertraline 100 MG tablet Commonly known as: ZOLOFT TAKE 1 TABLET EVERY DAY   Symbicort 160-4.5 MCG/ACT inhaler Generic drug: budesonide-formoterol INHALE 2 PUFFS TWICE DAILY IN THE MORNING  AND AT BEDTIME   traMADol 50 MG tablet Commonly known as: ULTRAM TAKE 1 TABLET BY MOUTH EVERY 6 HOURS AS NEEDED   trolamine salicylate 10 % cream Commonly known as: ASPERCREME Apply 1 application topically as needed for muscle pain.   vitamin B-12 1000 MCG tablet Commonly known as: CYANOCOBALAMIN Take 1,000 mcg by mouth daily.        Allergies:  Allergies  Allergen Reactions   Montelukast Sodium Palpitations and Other (See Comments)    REACTION: HEART PALPITATIONS, CHEST PAIN.   Sulfa Antibiotics Other (See Comments)    CHEST PAIN   Sulfonamide Derivatives Other (See Comments)    CHEST PAIN   Ace Inhibitors Other (See Comments)    PT. STATED UNKNOWN REACTION   Indomethacin Other (See Comments)    PT. STATED UNKNOWN REACTION    Past Medical History, Surgical history, Social history, and Family History were reviewed and updated.  Review of Systems: All other 10 point review of systems is negative.   Physical Exam:  weight is 181 lb 1.9 oz (82.2 kg). Her oral temperature is 98.4 F (36.9 C). Her blood pressure is 127/50 (abnormal) and her pulse is 78. Her respiration is 18 and oxygen saturation is 100%.   Wt Readings from Last 3 Encounters:  07/05/21 181 lb 1.9 oz (82.2 kg)  06/10/21 183 lb (83 kg)  04/30/21 187 lb 6.4 oz (85 kg)    Ocular: Sclerae unicteric, pupils equal, round and reactive to light Ear-nose-throat: Oropharynx clear, dentition fair Lymphatic: No cervical or supraclavicular adenopathy Lungs no rales or rhonchi, good excursion bilaterally Heart regular rate and rhythm, no murmur appreciated Abd soft, nontender, positive bowel sounds MSK no focal spinal tenderness, no joint edema Neuro: non-focal,  well-oriented, appropriate affect Breasts: Deferred   Lab Results  Component Value Date   WBC 4.7 07/05/2021   HGB 10.4 (L) 07/05/2021   HCT 34.7 (L) 07/05/2021   MCV 101.8 (H) 07/05/2021   PLT 101 (L) 07/05/2021   Lab Results  Component Value Date   FERRITIN 436 (H) 04/30/2021   IRON 140 04/30/2021   TIBC 253 04/30/2021   UIBC 113 (L) 04/30/2021   IRONPCTSAT 55 (H) 04/30/2021   Lab Results  Component Value Date   RETICCTPCT 0.9 07/05/2021   RBC 3.41 (L) 07/05/2021   RBC 3.34 (L) 07/05/2021   No results found for: "KPAFRELGTCHN", "LAMBDASER", "KAPLAMBRATIO" No results found for: "IGGSERUM", "IGA", "IGMSERUM" No results found for: "TOTALPROTELP", "ALBUMINELP", "A1GS", "A2GS", "BETS", "BETA2SER", "GAMS", "MSPIKE", "SPEI"   Chemistry      Component Value Date/Time  NA 140 06/11/2021 1319   NA 143 01/27/2020 1535   K 4.4 06/11/2021 1319   CL 97 (L) 06/11/2021 1319   CO2 36 (H) 06/11/2021 1319   BUN 57 (H) 06/11/2021 1319   BUN 40 (H) 01/27/2020 1535   CREATININE 2.01 (H) 06/11/2021 1319   CREATININE 1.36 (H) 08/23/2013 1134      Component Value Date/Time   CALCIUM 10.1 06/11/2021 1319   ALKPHOS 39 06/11/2021 1319   AST 12 (L) 06/11/2021 1319   ALT 8 06/11/2021 1319   BILITOT 0.4 06/11/2021 1319       Impression and Plan: [name] is a very pleasant 81 yo caucasian female with multifactorial anemia.  ESA give, Hgb 10.4.  Iron studies pending.  Lab check and injection every 3 weeks, follow-up in 9 weeks.   Lottie Dawson, NP 6/16/202310:41 AM

## 2021-07-08 NOTE — Telephone Encounter (Signed)
Per note on 06/22/21: Pt ready for scheduling on or after 06/05/21 - pt was called and lvm.    Out-of-pocket cost due at time of visit: $301   Primary: [employer] Prolia co-insurance: 20% (approximately $276) Admin fee co-insurance: 20% (approximately $25)

## 2021-07-08 NOTE — Telephone Encounter (Signed)
Pt has been scheduled.  °

## 2021-07-08 NOTE — Telephone Encounter (Signed)
Gave pt another call with his info. Left vm to return call.

## 2021-07-09 ENCOUNTER — Ambulatory Visit (INDEPENDENT_AMBULATORY_CARE_PROVIDER_SITE_OTHER): Payer: Medicare HMO

## 2021-07-09 DIAGNOSIS — M810 Age-related osteoporosis without current pathological fracture: Secondary | ICD-10-CM

## 2021-07-09 MED ORDER — DENOSUMAB 60 MG/ML ~~LOC~~ SOSY
60.0000 mg | PREFILLED_SYRINGE | Freq: Once | SUBCUTANEOUS | Status: AC
  Administered 2021-07-09: 60 mg via SUBCUTANEOUS

## 2021-07-09 NOTE — Progress Notes (Signed)
Patient her for Prolia injection per Dr. Blyth.  Injection given in left arm and patient tolerated well.  

## 2021-07-14 DIAGNOSIS — R0902 Hypoxemia: Secondary | ICD-10-CM | POA: Diagnosis not present

## 2021-07-14 DIAGNOSIS — J4520 Mild intermittent asthma, uncomplicated: Secondary | ICD-10-CM | POA: Diagnosis not present

## 2021-07-19 ENCOUNTER — Inpatient Hospital Stay: Payer: Medicare HMO

## 2021-07-19 DIAGNOSIS — Z79899 Other long term (current) drug therapy: Secondary | ICD-10-CM | POA: Diagnosis not present

## 2021-07-19 DIAGNOSIS — D631 Anemia in chronic kidney disease: Secondary | ICD-10-CM

## 2021-07-19 DIAGNOSIS — N183 Chronic kidney disease, stage 3 unspecified: Secondary | ICD-10-CM | POA: Diagnosis not present

## 2021-07-19 DIAGNOSIS — D509 Iron deficiency anemia, unspecified: Secondary | ICD-10-CM

## 2021-07-19 LAB — CBC WITH DIFFERENTIAL (CANCER CENTER ONLY)
Abs Immature Granulocytes: 0.02 10*3/uL (ref 0.00–0.07)
Basophils Absolute: 0 10*3/uL (ref 0.0–0.1)
Basophils Relative: 1 %
Eosinophils Absolute: 0.2 10*3/uL (ref 0.0–0.5)
Eosinophils Relative: 4 %
HCT: 36.6 % (ref 36.0–46.0)
Hemoglobin: 11.2 g/dL — ABNORMAL LOW (ref 12.0–15.0)
Immature Granulocytes: 0 %
Lymphocytes Relative: 15 %
Lymphs Abs: 1 10*3/uL (ref 0.7–4.0)
MCH: 31.4 pg (ref 26.0–34.0)
MCHC: 30.6 g/dL (ref 30.0–36.0)
MCV: 102.5 fL — ABNORMAL HIGH (ref 80.0–100.0)
Monocytes Absolute: 0.7 10*3/uL (ref 0.1–1.0)
Monocytes Relative: 11 %
Neutro Abs: 4.4 10*3/uL (ref 1.7–7.7)
Neutrophils Relative %: 69 %
Platelet Count: 121 10*3/uL — ABNORMAL LOW (ref 150–400)
RBC: 3.57 MIL/uL — ABNORMAL LOW (ref 3.87–5.11)
RDW: 15.4 % (ref 11.5–15.5)
WBC Count: 6.4 10*3/uL (ref 4.0–10.5)
nRBC: 0 % (ref 0.0–0.2)

## 2021-07-19 LAB — CMP (CANCER CENTER ONLY)
ALT: 10 U/L (ref 0–44)
AST: 12 U/L — ABNORMAL LOW (ref 15–41)
Albumin: 4.4 g/dL (ref 3.5–5.0)
Alkaline Phosphatase: 40 U/L (ref 38–126)
Anion gap: 5 (ref 5–15)
BUN: 33 mg/dL — ABNORMAL HIGH (ref 8–23)
CO2: 34 mmol/L — ABNORMAL HIGH (ref 22–32)
Calcium: 10.1 mg/dL (ref 8.9–10.3)
Chloride: 102 mmol/L (ref 98–111)
Creatinine: 1.7 mg/dL — ABNORMAL HIGH (ref 0.44–1.00)
GFR, Estimated: 30 mL/min — ABNORMAL LOW (ref >60)
Glucose, Bld: 111 mg/dL — ABNORMAL HIGH (ref 70–99)
Potassium: 5 mmol/L (ref 3.5–5.1)
Sodium: 141 mmol/L (ref 135–145)
Total Bilirubin: 0.6 mg/dL (ref 0.3–1.2)
Total Protein: 6.9 g/dL (ref 6.5–8.1)

## 2021-07-21 ENCOUNTER — Other Ambulatory Visit: Payer: Self-pay | Admitting: Family Medicine

## 2021-07-21 DIAGNOSIS — M549 Dorsalgia, unspecified: Secondary | ICD-10-CM

## 2021-07-22 NOTE — Telephone Encounter (Signed)
Requesting: tramadol '50mg'$   Contract: 01/02/2017 UDS: 01/02/2017 Last Visit: 06/10/21 Next Visit: 09/24/21 Last Refill: 05/14/21 #90 and 0RF  Please Advise

## 2021-07-24 ENCOUNTER — Other Ambulatory Visit: Payer: Self-pay | Admitting: Family Medicine

## 2021-07-24 ENCOUNTER — Other Ambulatory Visit: Payer: Self-pay

## 2021-07-24 DIAGNOSIS — H6982 Other specified disorders of Eustachian tube, left ear: Secondary | ICD-10-CM

## 2021-07-24 DIAGNOSIS — M549 Dorsalgia, unspecified: Secondary | ICD-10-CM

## 2021-07-24 MED ORDER — FLUTICASONE PROPIONATE 50 MCG/ACT NA SUSP: 2.0000 | Freq: Every day | NASAL | 1 refills | Status: DC

## 2021-07-25 ENCOUNTER — Telehealth: Payer: Self-pay | Admitting: Family Medicine

## 2021-07-25 NOTE — Telephone Encounter (Signed)
Left message for patient to call back and schedule Medicare Annual Wellness Visit (AWV).   Please offer to do virtually or by telephone.  Left office number and my jabber [phone].  Last AWV:07/25/2020  Please schedule at anytime with Nurse Health Advisor.

## 2021-07-26 ENCOUNTER — Telehealth: Payer: Self-pay | Admitting: Family Medicine

## 2021-07-26 NOTE — Telephone Encounter (Signed)
Pt called stating she was experiencing the following symptoms:  -Shortness of breath -Chest feels heavy  Pt was transferred to triage nurse for further eval.

## 2021-07-28 NOTE — Telephone Encounter (Addendum)
Last Prolia inj 07/09/21 Next Prolia inj due 01/09/22  Prior Auth: APPROVED PA# [id number]; Key: [id number] Valid: 06/05/21-01/19/22

## 2021-07-29 ENCOUNTER — Telehealth: Payer: Self-pay | Admitting: Internal Medicine

## 2021-07-29 NOTE — Telephone Encounter (Signed)
Called patient this evening and she is wanting to know if she can get an order for Duonebs. I advised patient that since she has a follow up on 7/24 with Dr Melvyn Novas it might be best to address this concern then but patient states she wants to start using Dunebs now.   Please advise sir

## 2021-07-29 NOTE — Telephone Encounter (Signed)
Called and spoke with patient to let her know that her medications will need to be discussed at her upcoming OV. Advised her that he is ok with the albuterol that Dr. Inda Castle started and that they would discuss everything at her [place]. She expressed understanding. Nothing further needed at this time.

## 2021-07-29 NOTE — Telephone Encounter (Signed)
Duoneb is not approp as 1st line therapy for asthma and was hardly needing albuterol in any form at last ov so if already using albuterol inhaler and failing to get relief, needs to be seen asap - I have plenty of openings this week but must return with all active medications in hand to regroup   In meantime I see Dr Inda Castle started neb albuterol which is ok until we get her to vo (can refill x one awaiting ov)

## 2021-07-29 NOTE — Telephone Encounter (Signed)
Called pt, no answer. Based on notes, pt contacted her pulmonologist and they reccommended she use the albuterol that was prescribed. Pt has upcoming appointment with them.

## 2021-07-29 NOTE — Telephone Encounter (Signed)
Nurse Assessment Nurse: Geraldine Contras, RN, [place] Date/Time (Eastern Time): 07/26/2021 5:04:14 PM Confirm and document reason for call. If symptomatic, describe symptoms. ---Caller states started to have problem breathing since yesterday. Pt has used neb and inhaler. Neb this morning, inhaler a few minutes ago. Pt hx of COPD, O2 via [place] at 2 lpm. Chest feels heavy, can't take a deep. Pt is at SpO2 93% normally at 95%. Pt states has been sneezing all day, body aches, Does the patient have any new or worsening symptoms? ---Yes Will a triage be completed? ---Yes Related visit to physician within the last 2 weeks? ---No Does the PT have any chronic conditions? (i.e. diabetes, asthma, this includes High risk factors for pregnancy, etc.) ---Yes List chronic conditions. ---COPD Is this a behavioral health or substance abuse call? ---No Guidelines Guideline Title Affirmed Question Affirmed Notes Nurse Date/Time (Eastern Time) Breathing Difficulty Slow, shallow and weak breathing Marmol, RN, [place] 07/26/2021 5:08:13 PM Disp. Time Eilene Ghazi Time) Disposition Final User 07/26/2021 5:02:46 PM Send to Urgent Queue [place] NOTE: All timestamps contained within this report are represented as [place] Standard Time. CONFIDENTIALTY NOTICE: This fax transmission is intended only for the addressee. It contains information that is legally privileged, confidential or otherwise protected from use or disclosure. If you are not the intended recipient, you are strictly prohibited from reviewing, disclosing, copying using or disseminating any of this information or taking any action in reliance on or regarding this information. If you have received this fax in error, please notify us immediately by telephone so that we can arrange for its return to [place]. Phone: [phone], Toll-Free: [phone], Fax: [phone] Page: 2 of 2 Call Id: [id number] [place]. Time Eilene Ghazi Time) Disposition Final  User 07/26/2021 5:11:01 PM Call EMS 911 Now Yes Marmol, RN, Jordan Hawks 07/26/2021 5:16:33 PM Sibley, RN, [place] Reason: Pt stated that she would call EMS, called back was sent to voicemail. Final Disposition 07/26/2021 5:11:01 PM Call EMS 911 Now Yes Marmol, RN, Beatrix Shipper Disagree/Comply Comply Caller Understands Yes PreDisposition Call Doctor Care Advice Given Per Guideline CALL EMS 911 NOW: * Immediate medical attention is needed. You need to hang up and call 911 (or an ambulance). * Triager Discretion: I'll call you back in a few minutes to be sure you were able to reach them. CARE ADVICE given per Breathing Difficulty (Adult) guideline. Referrals GO TO FACILITY OTHER - SPECIFY

## 2021-07-30 DIAGNOSIS — Z0100 Encounter for examination of eyes and vision without abnormal findings: Secondary | ICD-10-CM | POA: Diagnosis not present

## 2021-08-09 ENCOUNTER — Inpatient Hospital Stay: Payer: Medicare HMO | Attending: Family Medicine

## 2021-08-09 ENCOUNTER — Inpatient Hospital Stay: Payer: Medicare HMO

## 2021-08-09 VITALS — BP 123/59 | HR 78 | Temp 98.2°F | Resp 17

## 2021-08-09 DIAGNOSIS — D631 Anemia in chronic kidney disease: Secondary | ICD-10-CM

## 2021-08-09 DIAGNOSIS — D509 Iron deficiency anemia, unspecified: Secondary | ICD-10-CM

## 2021-08-09 DIAGNOSIS — Z79899 Other long term (current) drug therapy: Secondary | ICD-10-CM | POA: Diagnosis not present

## 2021-08-09 DIAGNOSIS — N183 Chronic kidney disease, stage 3 unspecified: Secondary | ICD-10-CM | POA: Insufficient documentation

## 2021-08-09 LAB — CBC WITH DIFFERENTIAL (CANCER CENTER ONLY)
Abs Immature Granulocytes: 0.02 10*3/uL (ref 0.00–0.07)
Basophils Absolute: 0 10*3/uL (ref 0.0–0.1)
Basophils Relative: 0 %
Eosinophils Absolute: 0.3 10*3/uL (ref 0.0–0.5)
Eosinophils Relative: 4 %
HCT: 34.2 % — ABNORMAL LOW (ref 36.0–46.0)
Hemoglobin: 10.6 g/dL — ABNORMAL LOW (ref 12.0–15.0)
Immature Granulocytes: 0 %
Lymphocytes Relative: 14 %
Lymphs Abs: 0.9 10*3/uL (ref 0.7–4.0)
MCH: 31.5 pg (ref 26.0–34.0)
MCHC: 31 g/dL (ref 30.0–36.0)
MCV: 101.5 fL — ABNORMAL HIGH (ref 80.0–100.0)
Monocytes Absolute: 0.6 10*3/uL (ref 0.1–1.0)
Monocytes Relative: 10 %
Neutro Abs: 4.5 10*3/uL (ref 1.7–7.7)
Neutrophils Relative %: 72 %
Platelet Count: 135 10*3/uL — ABNORMAL LOW (ref 150–400)
RBC: 3.37 MIL/uL — ABNORMAL LOW (ref 3.87–5.11)
RDW: 15.7 % — ABNORMAL HIGH (ref 11.5–15.5)
WBC Count: 6.2 10*3/uL (ref 4.0–10.5)
nRBC: 0 % (ref 0.0–0.2)

## 2021-08-09 LAB — CMP (CANCER CENTER ONLY)
ALT: 10 U/L (ref 0–44)
AST: 12 U/L — ABNORMAL LOW (ref 15–41)
Albumin: 4.3 g/dL (ref 3.5–5.0)
Alkaline Phosphatase: 44 U/L (ref 38–126)
Anion gap: 6 (ref 5–15)
BUN: 37 mg/dL — ABNORMAL HIGH (ref 8–23)
CO2: 31 mmol/L (ref 22–32)
Calcium: 9.6 mg/dL (ref 8.9–10.3)
Chloride: 105 mmol/L (ref 98–111)
Creatinine: 1.46 mg/dL — ABNORMAL HIGH (ref 0.44–1.00)
GFR, Estimated: 36 mL/min — ABNORMAL LOW (ref >60)
Glucose, Bld: 124 mg/dL — ABNORMAL HIGH (ref 70–99)
Potassium: 4.6 mmol/L (ref 3.5–5.1)
Sodium: 142 mmol/L (ref 135–145)
Total Bilirubin: 0.4 mg/dL (ref 0.3–1.2)
Total Protein: 6.8 g/dL (ref 6.5–8.1)

## 2021-08-09 MED ORDER — EPOETIN ALFA-EPBX 40000 UNIT/ML IJ SOLN
40000.0000 [IU] | Freq: Once | INTRAMUSCULAR | Status: AC
  Administered 2021-08-09: 40000 [IU] via SUBCUTANEOUS
  Filled 2021-08-09: qty 1

## 2021-08-09 NOTE — Patient Instructions (Signed)
Epoetin Alfa injection ?What is this medication? ?EPOETIN ALFA (e POE e tin AL fa) helps your body make more red blood cells. This medicine is used to treat anemia caused by chronic kidney disease, cancer chemotherapy, or HIV-therapy. It may also be used before surgery if you have anemia. ?This medicine may be used for other purposes; ask your health care provider or pharmacist if you have questions. ?COMMON BRAND NAME(S): Epogen, Procrit, Retacrit ?What should I tell my care team before I take this medication? ?They need to know if you have any of these conditions: ?cancer ?heart disease ?high blood pressure ?history of blood clots ?history of stroke ?low levels of folate, iron, or vitamin B12 in the blood ?seizures ?an unusual or allergic reaction to erythropoietin, albumin, benzyl alcohol, hamster proteins, other medicines, foods, dyes, or preservatives ?pregnant or trying to get pregnant ?breast-feeding ?How should I use this medication? ?This medicine is for injection into a vein or under the skin. It is usually given by a health care professional in a hospital or clinic setting. ?If you get this medicine at home, you will be taught how to prepare and give this medicine. Use exactly as directed. Take your medicine at regular intervals. Do not take your medicine more often than directed. ?It is important that you put your used needles and syringes in a special sharps container. Do not put them in a trash can. If you do not have a sharps container, call your pharmacist or healthcare provider to get one. ?A special MedGuide will be given to you by the pharmacist with each prescription and refill. Be sure to read this information carefully each time. ?Talk to your pediatrician regarding the use of this medicine in children. While this drug may be prescribed for selected conditions, precautions do apply. ?Overdosage: If you think you have taken too much of this medicine contact a poison control center or emergency  room at once. ?NOTE: This medicine is only for you. Do not share this medicine with others. ?What if I miss a dose? ?If you miss a dose, take it as soon as you can. If it is almost time for your next dose, take only that dose. Do not take double or extra doses. ?What may interact with this medication? ?Interactions have not been studied. ?This list may not describe all possible interactions. Give your health care provider a list of all the medicines, herbs, non-prescription drugs, or dietary supplements you use. Also tell them if you smoke, drink alcohol, or use illegal drugs. Some items may interact with your medicine. ?What should I watch for while using this medication? ?Your condition will be monitored carefully while you are receiving this medicine. ?You may need blood work done while you are taking this medicine. ?This medicine may cause a decrease in vitamin B6. You should make sure that you get enough vitamin B6 while you are taking this medicine. Discuss the foods you eat and the vitamins you take with your health care professional. ?What side effects may I notice from receiving this medication? ?Side effects that you should report to your doctor or health care professional as soon as possible: ?allergic reactions like skin rash, itching or hives, swelling of the face, lips, or tongue ?seizures ?signs and symptoms of a blood clot such as breathing problems; changes in vision; chest pain; severe, sudden headache; pain, swelling, warmth in the leg; trouble speaking; sudden numbness or weakness of the face, arm or leg ?signs and symptoms of a stroke like   changes in vision; confusion; trouble speaking or understanding; severe headaches; sudden numbness or weakness of the face, arm or leg; trouble walking; dizziness; loss of balance or coordination ?Side effects that usually do not require medical attention (report to your doctor or health care professional if they continue or are  bothersome): ?chills ?cough ?dizziness ?fever ?headaches ?joint pain ?muscle cramps ?muscle pain ?nausea, vomiting ?pain, redness, or irritation at site where injected ?This list may not describe all possible side effects. Call your doctor for medical advice about side effects. You may report side effects to FDA at 1-800-FDA-1088. ?Where should I keep my medication? ?Keep out of the reach of children. ?Store in a refrigerator between 2 and 8 degrees C (36 and 46 degrees F). Do not freeze or shake. Throw away any unused portion if using a single-dose vial. Multi-dose vials can be kept in the refrigerator for up to 21 days after the initial dose. Throw away unused medicine. ?NOTE: This sheet is a summary. It may not cover all possible information. If you have questions about this medicine, talk to your doctor, pharmacist, or health care provider. ?? 2023 Elsevier/Gold Standard (2016-09-09 00:00:00) ? ?

## 2021-08-12 ENCOUNTER — Encounter: Payer: Self-pay | Admitting: Internal Medicine

## 2021-08-12 ENCOUNTER — Ambulatory Visit: Payer: Medicare HMO | Admitting: Internal Medicine

## 2021-08-12 DIAGNOSIS — J4530 Mild persistent asthma, uncomplicated: Secondary | ICD-10-CM | POA: Diagnosis not present

## 2021-08-12 DIAGNOSIS — J9612 Chronic respiratory failure with hypercapnia: Secondary | ICD-10-CM | POA: Diagnosis not present

## 2021-08-12 DIAGNOSIS — J9611 Chronic respiratory failure with hypoxia: Secondary | ICD-10-CM | POA: Diagnosis not present

## 2021-08-12 MED ORDER — IPRATROPIUM-ALBUTEROL 0.5-2.5 (3) MG/3ML IN SOLN: 3.0000 mL | RESPIRATORY_TRACT | 11 refills | Status: DC | PRN

## 2021-08-12 NOTE — Assessment & Plan Note (Addendum)
HCO3   02/18/2018   = 31  02/22/2018  Room Air at Rest = 88%  >>> while Ambulating = 87% and  on 3 Liters of oxygen while Ambulating = 94% - 03/22/2018   Walked 2lpm   1 laps @  approx 263f each @ slow pace  stopped due to  Sob/desats resolved on 3lpm  And finished second lap s desats but stopped nonetheless with sob 11/11/2018  02 qualify Patient Saturations on Room Air at Rest = 100% andwhile Ambulating = 87% Patient Saturations on 2 Liters of oxygen while Ambulating = 94% - HC03  08/09/21  = 31  - 08/12/2021   Walked on 2lpm POC  x  2  lap(s) =  approx 300  ft  @ slow/rollator pace, stopped due to sob with lowest 02 sats  91%   Well compensated at present but reminded: Make sure you check your oxygen saturation  AT  your highest level of activity (not after you stop)   to be sure it stays over 90% and adjust  02 flow upward to maintain this level if needed but remember to turn it back to previous settings when you stop (to conserve your supply).    Each maintenance medication was reviewed in detail including emphasizing most importantly the difference between maintenance and prns and under what circumstances the prns are to be triggered using an action plan format where appropriate.  Total time for H and P, chart review, counseling, reviewing hfa/neb/02 device(s) , directly observing portions of ambulatory 02 saturation study/ and generating customized AVS unique to this office visit / same day charting > 40 min for pt not seen in over 2 y s/p acute exac

## 2021-08-12 NOTE — Progress Notes (Signed)
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 referred to pulmonary clinic 02/22/2018 by Dr   Joaquin Courts  With pft 04/26/13 c/w minimal airflow obst by curvature of f/v though overall pattern was restrictive assoc with mild kyphosis and chronic L HD elevation   History of Present Illness  02/22/2018  Pulmonary/ 1st office eval/[name]  Chief Complaint  Patient presents with   Consult    Shortness of breath  Dyspnea:  MMRC2 = can't walk a nl pace on a flat grade s sob but does fine slow and flat only a little better with 02  Cough: constant sense something stuck in throat but min mucoid production - worse with exp to smoke or strong scents  Sleep: 45 degrees with electric bed  - lower levels has more trouble breathng SABA use: increased use since d/c'd singulair  rec Plan A = Automatic = Symbicort 160 Take 2 puffs first thing in am and then another 2 puffs about 12 hours later.  Work on inhaler technique:     Plan B = Backup Only use your albuterol inhaler  Plan C = Crisis - only use your albuterol nebulizer if you first try Plan B and it fails to help > ok to use the nebulizer up to every 4 hours but if start needing it regularly call for immediate appointment Goal is to keep your 02 saturations above 90%  Please schedule a follow up office visit in 4 weeks, sooner if needed with pfts on return     03/22/2018  f/u ov/[name] re:  ? Asthma on symb 160 poor hfa/ singulair  Chief Complaint  Patient presents with   Follow-up    PFT's done today. Breathing is overall doing well. She is using her albuterol inhaler 5 x per wk on average.   Dyspnea:  MMRC2 = can't walk a nl pace on a flat grade s sob but does fine slow and flat shopping  Cough: minimal   Sleeping: 45 degrees x 5 years  SABA use: way too much, used neb 3 h prior to pft testing "thought I was supposed to be using twice daily" - it's what I was told (see last ov that occurred well after she was told this) 02: 2lpm 24/7  rec Plan A = Automatic = Symbicort 160 Take 2 puffs first thing in am and then another 2 puffs about 12 hours later.  Work on inhaler technique:  Plan B = Backup Only use your albuterol inhaler as a rescue medication Plan C = Crisis - only use your albuterol nebulizer if you first try Plan B Goal is to keep your 02 saturations above 90%    11/11/2018  f/u ov/[name] re:  Chronic asthma / 02 dep  Chief Complaint  Patient presents with   Follow-up    Breathing is overall doing well and no new co's. She is using her albuterol inhaler 5 x per wk and rarely uses neb.    Dyspnea:  Slow shopping x [employer] = MMRC2 = can't walk a nl pace on a flat grade s sob but does fine slow and flat on 1 lpm Cough: none Sleeping: 45 degrees elevated x years  SABA  use: occ if overdo it  02: 1lpm 24/7 hs and 2lpm with activity on POC  rec We will order a home 02 concentrator today at 1lpm   For now 02 rec is for 1lpm 24/7    Please schedule a follow up visit in 6 months but call sooner if needed  -correction :  02 flow should be 2lpm per POC   05/30/2019  f/u ov/[name] re: asthma/ had both covid shots/ maint on symbicort Chief Complaint  Patient presents with   Follow-up    6 month f/u for asthma. States her breathing has been stable. Had a fall back in March 2021 where she broke several ribs.   Dyspnea:  [employer] shopping once [facility] = can't walk 100 yards even at a slow pace at a flat grade s stopping due to sob  On 3lpm  Cough: none Sleeping: 45 degrees  SABA use: rarely 02: 2lpm at home but bumps up to 3lpm POC  Still having L cp where fell and dx rib fx  Rec No change in medications   Please schedule a follow up visit in 6 months > did not return    rx duoneb  helped more than albuterol on recent flare doe / was not sob at rest    08/12/2021  f/u ov/[name] re: chronic asthma   maint on symbicort   Chief Complaint  Patient presents with   Follow-up    Pt states she is having problems breathing x1 month ago with exertion. Pt states she has been diagnosed with COPD and Asthma and Bronchitis.   Dyspnea:  sev hundred feet flat on 2lpm POC Cough: p neb clear mucus  Sleeping: no problems 30 degrees electri bed  SABA use: 3-4 days p ex only - neb up to twice daily  02: 24/7 @  2lpm / on POC but not titrating  Covid status:   x  3    No obvious day to day or daytime variability or assoc excess/ purulent sputum or mucus plugs or hemoptysis or cp or chest tightness, subjective wheeze or overt sinus or hb symptoms.   Sleeping  without nocturnal  or early am exacerbation  of respiratory  c/o's or need for noct saba. Also denies any obvious fluctuation of symptoms with weather or environmental changes or other aggravating or alleviating factors except as outlined above   No unusual exposure hx or h/o childhood pna/ asthma or knowledge of premature birth.  Current Allergies, Complete Past Medical History, Past Surgical History, Family History, and Social History were reviewed in [employer] record.  ROS  The following are not active complaints unless bolded Hoarseness, sore throat, dysphagia, dental problems, itching, sneezing,  nasal congestion or discharge of excess mucus or purulent secretions, ear ache,   fever, chills, sweats, unintended wt loss or wt gain, classically pleuritic or exertional cp,  orthopnea pnd or arm/hand swelling  or leg swelling, presyncope, palpitations, abdominal pain, anorexia, nausea, vomiting, diarrhea  or change in bowel habits or change in bladder habits, change in stools or change in urine, dysuria, hematuria,  rash, arthralgias, visual complaints, headache, numbness, weakness or ataxia or problems with  walking or coordination,  change in mood or  memory.        Current Meds  Medication Sig   ACCU-CHEK GUIDE test strip CHECK BLOOD SUGAR ONE TIME WEEKLY   Accu-Chek Softclix Lancets lancets CHECK BLOOD SUGAR ONE TIME WEEKLY   acetaminophen (TYLENOL) 500 MG tablet Take 500  mg by mouth 2 (two) times daily.    albuterol (PROVENTIL) (2.5 MG/3ML) 0.083% nebulizer solution INHALE THE CONTENTS OF 1 VIAL VIA NEBULIZER EVERY 6 HOURS AS NEEDED FOR WHEEZING OR SHORTNESS OF BREATH   albuterol (VENTOLIN HFA) 108 (90 Base) MCG/ACT inhaler Inhale 2 puffs into the lungs every 4 (four) hours as needed for wheezing or shortness of breath.   Alcohol Swabs (ALCOHOL WIPES) 70 % PADS To use before checking blood sugars   allopurinol (ZYLOPRIM) 100 MG tablet TAKE 2 TABLETS ONE TIME DAILY   b complex vitamins tablet Take 1 tablet by mouth daily.   bacitracin ointment Apply 1 application topically 2 (two) times daily.   Blood Glucose Calibration (ACCU-CHEK AVIVA) SOLN USE AS DIRECTED WITH GLUCOMETER   blood glucose meter kit and supplies Check blood sugars once weekly.   Blood Glucose Monitoring Suppl (ACCU-CHEK GUIDE) w/Device KIT USE AS DIRECTED TO CHECK BLOOD SUGAR   docusate sodium (COLACE) 100 MG capsule Take 1 capsule (100 mg total) by mouth 2 (two) times daily.   fluticasone (FLONASE) 50 MCG/ACT nasal spray Place 2 sprays into both nostrils daily.   furosemide (LASIX) 40 MG tablet TAKE 1 TABLET DAILY AND A SECOND TABLET DAILY AS NEEDED FOR INCREASED EDEMA OR WEIGHT GAIN GREATER THAN 3 POUNDS IN 24 HOURS   loratadine (CLARITIN) 10 MG tablet Take 1 tablet (10 mg total) by mouth at bedtime. (Patient taking differently: Take 10 mg by mouth at bedtime as needed for allergies.)   metoprolol succinate (TOPROL-XL) 50 MG 24 hr tablet Take 1 tablet (50 mg total) by mouth daily. Take with or immediately following a meal.   mupirocin ointment (BACTROBAN) 2 % Apply 1 application topically 2 (two) times daily.   OXYGEN Inhale  2 L into the lungs continuous.    pantoprazole (PROTONIX) 40 MG tablet Take 1 tablet (40 mg total) by mouth 2 (two) times daily.   polyethylene glycol (MIRALAX / GLYCOLAX) 17 g packet Take 17 g by mouth daily.   pravastatin (PRAVACHOL) 40 MG tablet TAKE 1 TABLET EVERY DAY   Probiotic Product (PROBIOTIC PO) Take 1 tablet by mouth daily.    sertraline (ZOLOFT) 100 MG tablet TAKE 1 TABLET EVERY DAY   SYMBICORT 160-4.5 MCG/ACT inhaler INHALE 2 PUFFS TWICE DAILY IN THE MORNING  AND AT BEDTIME   traMADol (ULTRAM) 50 MG tablet TAKE 1 TABLET BY MOUTH EVERY 6 HOURS AS NEEDED   trolamine salicylate (ASPERCREME) 10 % cream Apply 1 application topically as needed for muscle pain.   vitamin B-12 (CYANOCOBALAMIN) 1000 MCG tablet Take 1,000 mcg by mouth daily.                  Objective:       08/12/2021        183  05/30/2019        180 11/11/2018      188   03/22/18 190 lb (86.2 kg)  02/22/18 197 lb (89.4 kg)  02/18/18 189 lb 1.9 oz (85.8 kg)    Vital signs reviewed  08/12/2021  - Note at rest 02 sats  96% on 2lpm POC    General appearance:    amb wf nad         HEENT : Oropharynx  clear/ top dentures   Nasal turbinates nl    NECK :  without  apparent JVD/ palpable Nodes/TM    LUNGS: no acc muscle use,  Mild barrel  contour chest wall with bilateral  Distant  bs esp L base s audible wheeze and  without cough on insp or exp maneuvers  and mild  Hyperresonant  to  percussion bilaterally     CV:  RRR  no s3 or murmur or increase in P2, and no edema   ABD:  soft and nontender with pos end  insp Hoover's  in the supine position.  No bruits or organomegaly appreciated   MS:  Nl gait/ ext warm without deformities Or obvious joint restrictions  calf tenderness, cyanosis or clubbing     SKIN: warm and dry without lesions    NEURO:  alert, approp, nl sensorium with  no motor or cerebellar deficits apparent.          I personally reviewed images and agree with radiology impression as  follows:  CXR:   portable 04/08/20  Cardiomegaly, COPD.  Chronic changes.  No active disease           Assessment

## 2021-08-12 NOTE — Patient Instructions (Addendum)
Plan A = Automatic = Always=    symbicort 160 Take 2 puffs first thing in am and then another 2 puffs about 12 hours later.   Work on inhaler technique:  relax and gently blow all the way out then take a nice smooth full deep breath back in, triggering the inhaler at same time you start breathing in.  Hold for up to 5 seconds if you can. Blow out thru nose. Rinse and gargle with water when done.  If mouth or throat bother you at all,  try brushing teeth/gums/tongue with arm and hammer toothpaste/ make a slurry and gargle and spit out.    - remember how golfers take practive swings and use the empty symbicort device is best   Plan B = Backup (to supplement plan A, not to replace it) Only use your albuterol inhaler as a rescue medication to be used if you can't catch your breath by resting or doing a relaxed purse lip breathing pattern.  - The less you use it, the better it will work when you need it. - Ok to use the inhaler up to 2 puffs  every 4 hours if you must but call for appointment if use goes up over your usual need - Don't leave home without it !!  (think of it like the spare tire for your car)   Plan C = Crisis (instead of Plan B but only if Plan B stops working) - only use your duoneb per nebulizer if you first try Plan B and it fails to help > ok to use the nebulizer up to every 4 hours but if start needing it regularly call for immediate appointment  Ok to try albuterol 15 min before an activity (on alternating days)  that you know would usually make you short of breath and see if it makes any difference and if makes none then don't take albuterol after activity unless you can't catch your breath as this means it's the resting that helps, not the albuterol.  Make sure you check your oxygen saturation  AT  your highest level of activity (not after you stop)   to be sure it stays over 90% and adjust  02 flow upward to maintain this level if needed but remember to turn it back to previous  settings when you stop (to conserve your supply).  - we are referring your for a best fit (hopefully lighter) form of portable concentrator       Please schedule a follow up visit in 3 months but call sooner if needed  with all medications /inhalers/ solutions in hand so we can verify exactly what you are taking. This includes all medications from all doctors and over the counters

## 2021-08-12 NOTE — Assessment & Plan Note (Signed)
Never smoker - Allergy profile 03/11/13  IgE 11 RAST  - Alpha one AT Screen 03/11/13  Level 167  PFT's  04/26/13   FEV1 1.47 (67 % ) ratio 0.77  p no % improvement from saba p nothing prior to study with DLCO  64 % corrects to 77 % for alv volume with mild curvature   - PFT's  03/22/2018  FEV1 1.11 (54 % ) ratio 0.70  p no % improvement from saba p neb saba 3 h  prior to study with DLCO  53 % corrects to 90 % for alv volume and ERV 16%    With mild curvature  - 02/22/2018   continue symbicort 160 2bid  - 08/12/2021  After extensive coaching inhaler device,  effectiveness =    60%   Overusing saba at baseline then went to ER p neb failed to improve doe, was not sob at rest   [facility]  ABC action plan with duoneb as plan C Work on optimal hfa vs change to breo at next ov  F/u in 3 m to regroup with all meds in hand using a trust but verify approach to confirm accurate Medication  Reconciliation The principal here is that until we are certain that the  patients are doing what we've asked, it makes no sense to ask them to do more.

## 2021-08-14 ENCOUNTER — Other Ambulatory Visit: Payer: Self-pay

## 2021-08-14 MED ORDER — ALBUTEROL SULFATE HFA 108 (90 BASE) MCG/ACT IN AERS: 2.0000 | INHALATION_SPRAY | RESPIRATORY_TRACT | 3 refills | Status: DC | PRN

## 2021-08-16 ENCOUNTER — Other Ambulatory Visit: Payer: Self-pay

## 2021-08-16 ENCOUNTER — Telehealth: Payer: Self-pay | Admitting: Cardiology

## 2021-08-16 NOTE — Patient Outreach (Signed)
[facility]) Care Management  08/16/2021  RAYLAN TROIANI 1940-05-28 [id number]   Telephone Screen  Referral Date: 08/16/21 Referral Source: Nurse Call Center Referral Reason: ".Pt states she took about 1/4 of a marijuana gummy this evening to help with her chronic pain.  Pt states the gummy did help her pain but she wanted to make sure it was not going to have any adverse reactions with her CHF medications.  Instructed pt to call her cardiologist tomorrow to make sure there will be no adverse reactions between the medication she is prescribed for her heart & the marijuana gummies.  Pt in agreement.  Pt states she is slightly dizzy & this started after taking the gummy.  Pt is aware this can happen when taking marijuana.  Instructed pt to sit down & do not get up for a little while & to drink some water.  Pt denies chest pain, shortness of breath, or any other complications.  Instructed pt to call back if the dizziness does not improve or she begins to have other adverse side effects.  Pt in agreement."   Outreach attempt #1 to patient. Spoke with patient who voices she is feeling better. States that was the first time she took gummy and it really relieved her pain. She is aware that she needs MD approval and medical prescription for med and states she plans to discuss with MD when she calls office today. Her dizziness has resolved. She attributes it to being dehydrated and having not drunk much fluids/water. BP is stable and normal today and overall she feels better. Denies any RN CM needs or concerns at this time. Patient aware that she can call 24 hr Nurse Line for any future needs or concerns. She voiced understanding and appreciation.   Plan: RN CM will close case.   Enzo Montgomery, RN,BSN,CCM [facility] Telephonic Care Management Coordinator Direct Phone: [phone] Toll Free: [phone] Fax: [phone]

## 2021-08-16 NOTE — Telephone Encounter (Signed)
Pt would like to know if it is okay for her to take a "marijuana" gummy. She might be referring to CBD gummy. Please advise.

## 2021-08-16 NOTE — Telephone Encounter (Signed)
Left message to call office

## 2021-08-21 NOTE — Telephone Encounter (Signed)
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 Do not recommend.

## 2021-08-21 NOTE — Telephone Encounter (Signed)
Left message for patient call back

## 2021-08-26 ENCOUNTER — Other Ambulatory Visit: Payer: Self-pay | Admitting: Family Medicine

## 2021-08-26 DIAGNOSIS — M549 Dorsalgia, unspecified: Secondary | ICD-10-CM

## 2021-08-26 NOTE — Telephone Encounter (Signed)
Requesting: tramadol '50mg'$   Contract: 01/02/2017 UDS: Ordered on 01/25/2018, not done? Last Visit: 06/10/21 Next Visit: 09/24/21 Last Refill: 07/22/21 #90 and 0RF   Please Advise

## 2021-08-28 ENCOUNTER — Ambulatory Visit: Payer: Medicare HMO

## 2021-08-28 NOTE — Progress Notes (Deleted)
Subjective:   [name] is a 81 y.o. female who presents for Medicare Annual (Subsequent) preventive examination.  Review of Systems    ***       Objective:    There were no vitals filed for this visit. There is no height or weight on file to calculate BMI.     07/05/2021   10:34 AM 04/30/2021    1:25 PM 02/26/2021    2:20 PM 12/25/2020    8:58 AM 11/07/2020    1:29 PM 09/26/2020    2:32 PM 08/16/2020   10:46 AM  Advanced Directives  Does Patient Have a Medical Advance Directive? No No Yes Yes Yes No Yes  Type of Advance Directive Living will;Healthcare Power of Attorney Living will;Healthcare Power of [place];Living will [place];Living will Healthcare Power of [place];Living will  Does patient want to make changes to medical advance directive?  No - Patient declined No - Patient declined No - Patient declined   No - Patient declined  Copy of [place] in Chart? No - copy requested No - copy requested No - copy requested No - copy requested No - copy requested  No - copy requested  Would patient like information on creating a medical advance directive? No - Patient declined No - Patient declined No - Patient declined No - Patient declined  No - Patient declined No - Patient declined    Current Medications (verified) Outpatient Encounter Medications as of 08/28/2021  Medication Sig   ACCU-CHEK GUIDE test strip CHECK BLOOD SUGAR ONE TIME WEEKLY   Accu-Chek Softclix Lancets lancets CHECK BLOOD SUGAR ONE TIME WEEKLY   acetaminophen (TYLENOL) 500 MG tablet Take 500 mg by mouth 2 (two) times daily.    albuterol (VENTOLIN HFA) 108 (90 Base) MCG/ACT inhaler Inhale 2 puffs into the lungs every 4 (four) hours as needed for wheezing or shortness of breath.   Alcohol Swabs (ALCOHOL WIPES) 70 % PADS To use before checking blood sugars   allopurinol (ZYLOPRIM) 100 MG tablet TAKE 2 TABLETS ONE  TIME DAILY   b complex vitamins tablet Take 1 tablet by mouth daily.   bacitracin ointment Apply 1 application topically 2 (two) times daily.   Blood Glucose Calibration (ACCU-CHEK AVIVA) SOLN USE AS DIRECTED WITH GLUCOMETER   blood glucose meter kit and supplies Check blood sugars once weekly.   Blood Glucose Monitoring Suppl (ACCU-CHEK GUIDE) w/Device KIT USE AS DIRECTED TO CHECK BLOOD SUGAR   docusate sodium (COLACE) 100 MG capsule Take 1 capsule (100 mg total) by mouth 2 (two) times daily.   fluticasone (FLONASE) 50 MCG/ACT nasal spray Place 2 sprays into both nostrils daily.   furosemide (LASIX) 40 MG tablet TAKE 1 TABLET DAILY AND A SECOND TABLET DAILY AS NEEDED FOR INCREASED EDEMA OR WEIGHT GAIN GREATER THAN 3 POUNDS IN 24 HOURS   ipratropium-albuterol (DUONEB) 0.5-2.5 (3) MG/3ML SOLN Take 3 mLs by nebulization every 4 (four) hours as needed.   loratadine (CLARITIN) 10 MG tablet Take 1 tablet (10 mg total) by mouth at bedtime. (Patient taking differently: Take 10 mg by mouth at bedtime as needed for allergies.)   metoprolol succinate (TOPROL-XL) 50 MG 24 hr tablet Take 1 tablet (50 mg total) by mouth daily. Take with or immediately following a meal.   mupirocin ointment (BACTROBAN) 2 % Apply 1 application topically 2 (two) times daily.   OXYGEN Inhale 2 L into the lungs  continuous.    pantoprazole (PROTONIX) 40 MG tablet Take 1 tablet (40 mg total) by mouth 2 (two) times daily.   polyethylene glycol (MIRALAX / GLYCOLAX) 17 g packet Take 17 g by mouth daily.   pravastatin (PRAVACHOL) 40 MG tablet TAKE 1 TABLET EVERY DAY   Probiotic Product (PROBIOTIC PO) Take 1 tablet by mouth daily.    sertraline (ZOLOFT) 100 MG tablet TAKE 1 TABLET EVERY DAY   SYMBICORT 160-4.5 MCG/ACT inhaler INHALE 2 PUFFS TWICE DAILY IN THE MORNING  AND AT BEDTIME   traMADol (ULTRAM) 50 MG tablet TAKE 1 TABLET BY MOUTH EVERY 6 HOURS AS NEEDED   trolamine salicylate (ASPERCREME) 10 % cream Apply 1 application topically  as needed for muscle pain.   vitamin B-12 (CYANOCOBALAMIN) 1000 MCG tablet Take 1,000 mcg by mouth daily.    No facility-administered encounter medications on file as of 08/28/2021.    Allergies (verified) Montelukast sodium, Sulfa antibiotics, Sulfonamide derivatives, Ace inhibitors, and Indomethacin   History: Past Medical History:  Diagnosis Date   Abnormal glucose tolerance test    Abnormal TSH 09/22/2016   ACE-inhibitor cough    Anemia 03/12/2014   Arthritis    Asthma    PFT 02/06/09 FEV1 1.41 (65%), FVC 1.92 (64), FEV1% 74, TLC 3.47 (71%), DLCO 48%, +BD   Atypical chest pain    s/p cath, Normal coronaries, Non ST elevation myocardial infarction, Rt groin pseudoaneurysm   Bacterial vaginosis 03/12/2014   Cellulitis 06/22/2016   Chronic kidney disease (CKD), stage III (moderate) (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   Renal insufficiency 09/22/2016   Unspecified constipation 06/05/2013   Vitamin D deficiency 01/01/2015   Past Surgical History:  Procedure Laterality Date   [place]   Family History  Problem Relation Age of Onset   Asthma Sister    Hypertension Sister    Hyperlipidemia Sister    Uterine cancer Sister    Coronary artery disease Brother        x2   Arthritis Brother    Lung cancer Brother        smoker   Hypertension Sister    Arthritis Sister    Hyperlipidemia Sister    Emphysema Sister    COPD Sister        smoker   Heart disease Sister    Hyperlipidemia Sister     Hypertension Sister    Arthritis Sister    Emphysema Brother    Heart disease Brother         smoker   Heart attack Brother    Mental illness Father    Suicidality Father    Heart disease Mother    Hyperlipidemia Mother    Heart attack Mother    Epilepsy Daughter    Hypertension Daughter    Obesity Daughter    COPD Brother        smoker   Lung cancer Brother    Coronary artery disease Other  Colon polyps Sister    Social History   Socioeconomic History   Marital status: Widowed    Spouse name: Not on file   Number of children: 1   Years of education: Not on file   Highest education level: Not on file  Occupational History   Occupation: Retired    Fish farm manager: [employer]  Tobacco Use   Smoking status: Never    Passive exposure: Past   Smokeless tobacco: Never  Vaping Use   Vaping Use: Never used  Substance and Sexual Activity   Alcohol use: No   Drug use: No   Sexual activity: Never    Comment: lives alone, no dietary restrictions  Other Topics Concern   Not on file  Social History Narrative   Not on file   Social Determinants of Health   Financial Resource Strain: Low Risk  (07/25/2020)   Overall Financial Resource Strain (CARDIA)    Difficulty of Paying Living Expenses: Not hard at all  Food Insecurity: No Food Insecurity (07/25/2020)   Hunger Vital Sign    Worried About Running Out of Food in the Last Year: Never true    [place] in the Last Year: Never true  Transportation Needs: No Transportation Needs (07/25/2020)   PRAPARE - Hydrologist (Medical): No    Lack of Transportation (Non-Medical): No  Physical Activity: Inactive (07/25/2020)   Exercise Vital Sign    Days of Exercise per Week: 0 days    Minutes of Exercise per Session: 0 min  Stress: No Stress Concern Present (07/25/2020)   [place]    Feeling of Stress : Not at all  Social Connections: Socially  Isolated (07/25/2020)   Social Connection and Isolation Panel [NHANES]    Frequency of Communication with Friends and Family: More than three times a week    Frequency of Social Gatherings with Friends and Family: More than three times a week    Attends Religious Services: Never    Marine scientist or Organizations: No    Attends Archivist Meetings: Never    Marital Status: Widowed    Tobacco Counseling Counseling given: Not Answered   Clinical Intake:                 Diabetic?no         Activities of Daily Living     No data to display          Patient Care Team: Mosie Lukes, MD as PCP - General (Family Medicine) Sueanne Margarita, MD as PCP - Cardiology (Cardiology) Shirley Muscat Loreen Freud, MD as Referring Physician (Optometry) Parrett, Fonnie Mu, NP as Nurse Practitioner (Pulmonary Disease) Rigoberto Noel, MD as Consulting Physician (Pulmonary Disease) Donato Heinz, MD as Consulting Physician (Nephrology) Warden Fillers, MD as Consulting Physician (Ophthalmology)  Indicate any recent Medical Services you may have received from other than [facility] providers in the past year (date may be approximate).     Assessment:   This is a routine wellness examination for Imaan.  Hearing/Vision screen No results found.  Dietary issues and exercise activities discussed:     Goals Addressed   None    Depression Screen    07/25/2020    1:50 PM 02/23/2020    3:14 PM 02/10/2019    2:54 PM 02/08/2018    1:36 PM 02/05/2017    1:19 PM 11/28/2016    1:32 PM 01/07/2016  2:19 PM  PHQ 2/9 Scores  PHQ - 2 Score 1 0 0 0 $R'1 1 2  'OA$ PHQ- 9 Score       8    Fall Risk    07/25/2020    1:49 PM 02/23/2020    3:13 PM 02/10/2019    2:53 PM 02/08/2018    1:36 PM 02/05/2017    1:19 PM  Fall Risk   Falls in the past year? 1 0 1 1 No  Number falls in past yr: 0 0 0 0   Injury with Fall? 1 0 0 1   Risk for fall due to : History of fall(s)      Follow up Falls  prevention discussed  Education provided;Falls prevention discussed      FALL RISK PREVENTION PERTAINING TO THE HOME:  Any stairs in or around the home? {YES/NO:21197} If so, are there any without handrails? {YES/NO:21197} Home free of loose throw rugs in walkways, pet beds, electrical cords, etc? {YES/NO:21197} Adequate lighting in your home to reduce risk of falls? {YES/NO:21197}  ASSISTIVE DEVICES UTILIZED TO PREVENT FALLS:  Life alert? {YES/NO:21197} Use of a cane, walker or w/c? {YES/NO:21197} Grab bars in the bathroom? {YES/NO:21197} Shower chair or bench in shower? {YES/NO:21197} Elevated toilet seat or a handicapped toilet? {YES/NO:21197}  TIMED UP AND GO:  Was the test performed? {YES/NO:21197}.  Length of time to ambulate 10 feet: *** sec.   {Appearance of DPOE:4235361}  Cognitive Function:    02/05/2017    1:22 PM 01/07/2016    2:24 PM  MMSE - Mini Mental State Exam  Orientation to time 5 4  Orientation to Place 5 5  Registration 3 3  Attention/ Calculation 5 5  Recall 3 3  Language- name 2 objects 2 2  Language- repeat 1 1  Language- follow 3 step command 3 3  Language- read & follow direction 1 1  Write a sentence 1 1  Copy design 1 0  Total score 30 28        Immunizations Immunization History  Administered Date(s) Administered   Fluad Quad(high Dose 65+) 11/11/2018   Influenza Split 12/08/2011   Influenza Whole 10/20/2006, 11/09/2007, 11/14/2008, 10/30/2009   Influenza, High Dose Seasonal PF 09/25/2015, 10/21/2015, 10/26/2017   Influenza,inj,Quad PF,6+ Mos 10/20/2012, 11/28/2013, 10/02/2014, 09/18/2016   PFIZER(Purple Top)SARS-COV-2 Vaccination 03/19/2019, 05/03/2019, 01/04/2020   Pneumococcal Conjugate-13 02/21/2013   Pneumococcal Polysaccharide-23 06/30/2006   Td 06/01/2007, 07/25/2020    TDAP status: Up to date  {Flu Vaccine status:2101806}  Pneumococcal vaccine status: Up to date  {Covid-19 vaccine status:2101808}  Qualifies for  Shingles Vaccine? {YES/NO:21197}  Zostavax completed {YES/NO:21197}  {Shingrix Completed?:2101804}  Screening Tests Health Maintenance  Topic Date Due   Zoster Vaccines- Shingrix (1 of 2) Never done   COLONOSCOPY (Pts 45-56yrs Insurance coverage will need to be confirmed)  11/10/2018   OPHTHALMOLOGY EXAM  07/28/2019   COVID-19 Vaccine (4 - Booster for Pfizer series) 02/29/2020   URINE MICROALBUMIN  02/22/2021   FOOT EXAM  06/26/2021   INFLUENZA VACCINE  08/20/2021   HEMOGLOBIN A1C  12/11/2021   TETANUS/TDAP  07/26/2030   Pneumonia Vaccine 62+ Years old  Completed   DEXA SCAN  Completed   HPV VACCINES  Aged Out    Health Maintenance  Health Maintenance Due  Topic Date Due   Zoster Vaccines- Shingrix (1 of 2) Never done   COLONOSCOPY (Pts 45-16yrs Insurance coverage will need to be confirmed)  11/10/2018   OPHTHALMOLOGY EXAM  07/28/2019  COVID-19 Vaccine (4 - Booster for Pfizer series) 02/29/2020   URINE MICROALBUMIN  02/22/2021   FOOT EXAM  06/26/2021   INFLUENZA VACCINE  08/20/2021    {Colorectal cancer screening:2101809}  Mammogram status: Completed 09/11/20. Repeat every year  Bone Density status: Completed 09/11/20. Results reflect: Bone density results: OSTEOPOROSIS. Repeat every 2 years.  Lung Cancer Screening: (Low Dose CT Chest recommended if Age 28-80 years, 30 pack-year currently smoking OR have quit w/in 15years.) does not qualify.   Lung Cancer Screening Referral: n/a  Additional Screening:  Hepatitis C Screening: does not qualify; Completed aged out  Vision Screening: Recommended annual ophthalmology exams for early detection of glaucoma and other disorders of the eye. Is the patient up to date with their annual eye exam?  {YES/NO:21197} Who is the provider or what is the name of the office in which the patient attends annual eye exams? *** If pt is not established with a provider, would they like to be referred to a provider to establish care?  {YES/NO:21197}.   Dental Screening: Recommended annual dental exams for proper oral hygiene  Community Resource Referral / Chronic Care Management: CRR required this visit?  {YES/NO:21197}  CCM required this visit?  {YES/NO:21197}     Plan:     I have personally reviewed and noted the following in the patient's chart:   Medical and social history Use of alcohol, tobacco or illicit drugs  Current medications and supplements including opioid prescriptions.  Functional ability and status Nutritional status Physical activity Advanced directives List of other physicians Hospitalizations, surgeries, and ER visits in previous 12 months Vitals Screenings to include cognitive, depression, and falls Referrals and appointments  In addition, I have reviewed and discussed with patient certain preventive protocols, quality metrics, and best practice recommendations. A written personalized care plan for preventive services as well as general preventive health recommendations were provided to patient.     Duard Brady Marizol Borror, [place]   08/28/2021   Nurse Notes: ***

## 2021-09-05 ENCOUNTER — Encounter: Payer: Self-pay | Admitting: Family

## 2021-09-05 ENCOUNTER — Inpatient Hospital Stay: Payer: Medicare HMO | Attending: Family Medicine

## 2021-09-05 ENCOUNTER — Inpatient Hospital Stay: Payer: Medicare HMO | Admitting: Family

## 2021-09-05 ENCOUNTER — Inpatient Hospital Stay: Payer: Medicare HMO

## 2021-09-05 ENCOUNTER — Other Ambulatory Visit: Payer: Self-pay

## 2021-09-05 VITALS — BP 126/53 | HR 110 | Temp 98.3°F | Resp 20 | Ht 65.0 in | Wt 181.1 lb

## 2021-09-05 DIAGNOSIS — D509 Iron deficiency anemia, unspecified: Secondary | ICD-10-CM

## 2021-09-05 DIAGNOSIS — D631 Anemia in chronic kidney disease: Secondary | ICD-10-CM

## 2021-09-05 DIAGNOSIS — N183 Chronic kidney disease, stage 3 unspecified: Secondary | ICD-10-CM | POA: Diagnosis not present

## 2021-09-05 DIAGNOSIS — N184 Chronic kidney disease, stage 4 (severe): Secondary | ICD-10-CM

## 2021-09-05 DIAGNOSIS — Z79899 Other long term (current) drug therapy: Secondary | ICD-10-CM | POA: Diagnosis not present

## 2021-09-05 LAB — RETICULOCYTES
Immature Retic Fract: 8.5 % (ref 2.3–15.9)
RBC.: 3.36 MIL/uL — ABNORMAL LOW (ref 3.87–5.11)
Retic Count, Absolute: 33.9 10*3/uL (ref 19.0–186.0)
Retic Ct Pct: 1 % (ref 0.4–3.1)

## 2021-09-05 LAB — CBC WITH DIFFERENTIAL (CANCER CENTER ONLY)
Abs Immature Granulocytes: 0.01 10*3/uL (ref 0.00–0.07)
Basophils Absolute: 0 10*3/uL (ref 0.0–0.1)
Basophils Relative: 0 %
Eosinophils Absolute: 0.3 10*3/uL (ref 0.0–0.5)
Eosinophils Relative: 5 %
HCT: 34.2 % — ABNORMAL LOW (ref 36.0–46.0)
Hemoglobin: 10.6 g/dL — ABNORMAL LOW (ref 12.0–15.0)
Immature Granulocytes: 0 %
Lymphocytes Relative: 18 %
Lymphs Abs: 0.9 10*3/uL (ref 0.7–4.0)
MCH: 31.3 pg (ref 26.0–34.0)
MCHC: 31 g/dL (ref 30.0–36.0)
MCV: 100.9 fL — ABNORMAL HIGH (ref 80.0–100.0)
Monocytes Absolute: 0.6 10*3/uL (ref 0.1–1.0)
Monocytes Relative: 11 %
Neutro Abs: 3.3 10*3/uL (ref 1.7–7.7)
Neutrophils Relative %: 66 %
Platelet Count: 113 10*3/uL — ABNORMAL LOW (ref 150–400)
RBC: 3.39 MIL/uL — ABNORMAL LOW (ref 3.87–5.11)
RDW: 14.8 % (ref 11.5–15.5)
WBC Count: 5.1 10*3/uL (ref 4.0–10.5)
nRBC: 0 % (ref 0.0–0.2)

## 2021-09-05 LAB — IRON AND IRON BINDING CAPACITY (CC-WL,HP ONLY)
Iron: 122 ug/dL (ref 28–170)
Saturation Ratios: 48 % — ABNORMAL HIGH (ref 10.4–31.8)
TIBC: 255 ug/dL (ref 250–450)
UIBC: 133 ug/dL — ABNORMAL LOW (ref 148–442)

## 2021-09-05 LAB — FERRITIN: Ferritin: 367 ng/mL — ABNORMAL HIGH (ref 11–307)

## 2021-09-05 MED ORDER — EPOETIN ALFA-EPBX 40000 UNIT/ML IJ SOLN
40000.0000 [IU] | Freq: Once | INTRAMUSCULAR | Status: AC
  Administered 2021-09-05: 40000 [IU] via SUBCUTANEOUS
  Filled 2021-09-05: qty 1

## 2021-09-05 NOTE — Patient Instructions (Signed)
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
## 2021-09-06 ENCOUNTER — Ambulatory Visit: Payer: Medicare HMO | Admitting: Family

## 2021-09-06 ENCOUNTER — Ambulatory Visit: Payer: Medicare HMO

## 2021-09-06 ENCOUNTER — Other Ambulatory Visit: Payer: Medicare HMO

## 2021-09-09 ENCOUNTER — Telehealth: Payer: Self-pay | Admitting: *Deleted

## 2021-09-09 NOTE — Telephone Encounter (Signed)
Per 09/05/21 los - called and gave upcoming appointments - confirmed

## 2021-09-23 NOTE — Assessment & Plan Note (Signed)
Continue to monitor

## 2021-09-23 NOTE — Assessment & Plan Note (Signed)
hgba1c acceptable, minimize simple carbs. Increase exercise as tolerated. Continue current meds 

## 2021-09-23 NOTE — Assessment & Plan Note (Signed)
Hydrate and monitor 

## 2021-09-23 NOTE — Assessment & Plan Note (Signed)
Encourage heart healthy diet such as MIND or DASH diet, increase exercise, avoid trans fats, simple carbohydrates and processed foods, consider a krill or fish or flaxseed oil cap daily. Tolerating Pravastatin 

## 2021-09-23 NOTE — Assessment & Plan Note (Signed)
Supplement and monitor 

## 2021-09-23 NOTE — Assessment & Plan Note (Signed)
Stable iron stores improving.

## 2021-09-23 NOTE — Assessment & Plan Note (Signed)
No recent exacerbation 

## 2021-09-23 NOTE — Assessment & Plan Note (Signed)
Following closely with pulmonology

## 2021-09-23 NOTE — Assessment & Plan Note (Signed)
Well controlled, no changes to meds. Encouraged heart healthy diet such as the DASH diet and exercise as tolerated.  °

## 2021-09-24 ENCOUNTER — Other Ambulatory Visit: Payer: Self-pay

## 2021-09-24 ENCOUNTER — Ambulatory Visit (INDEPENDENT_AMBULATORY_CARE_PROVIDER_SITE_OTHER): Payer: Medicare HMO | Admitting: Family Medicine

## 2021-09-24 ENCOUNTER — Ambulatory Visit (HOSPITAL_BASED_OUTPATIENT_CLINIC_OR_DEPARTMENT_OTHER)
Admission: RE | Admit: 2021-09-24 | Discharge: 2021-09-24 | Disposition: A | Discharge status: Home / Self Care | Payer: Medicare HMO | Source: Ambulatory Visit | Attending: Family Medicine | Admitting: Family Medicine

## 2021-09-24 VITALS — BP 128/64 | HR 86 | Temp 98.0°F | Resp 16 | Ht 65.0 in | Wt 190.0 lb

## 2021-09-24 DIAGNOSIS — N184 Chronic kidney disease, stage 4 (severe): Secondary | ICD-10-CM | POA: Diagnosis not present

## 2021-09-24 DIAGNOSIS — E118 Type 2 diabetes mellitus with unspecified complications: Secondary | ICD-10-CM | POA: Diagnosis not present

## 2021-09-24 DIAGNOSIS — J4530 Mild persistent asthma, uncomplicated: Secondary | ICD-10-CM

## 2021-09-24 DIAGNOSIS — Z79899 Other long term (current) drug therapy: Secondary | ICD-10-CM | POA: Diagnosis not present

## 2021-09-24 DIAGNOSIS — E782 Mixed hyperlipidemia: Secondary | ICD-10-CM | POA: Diagnosis not present

## 2021-09-24 DIAGNOSIS — M109 Gout, unspecified: Secondary | ICD-10-CM | POA: Diagnosis not present

## 2021-09-24 DIAGNOSIS — E559 Vitamin D deficiency, unspecified: Secondary | ICD-10-CM | POA: Diagnosis not present

## 2021-09-24 DIAGNOSIS — D631 Anemia in chronic kidney disease: Secondary | ICD-10-CM | POA: Diagnosis not present

## 2021-09-24 DIAGNOSIS — R079 Chest pain, unspecified: Secondary | ICD-10-CM | POA: Diagnosis not present

## 2021-09-24 DIAGNOSIS — I10 Essential (primary) hypertension: Secondary | ICD-10-CM | POA: Diagnosis not present

## 2021-09-24 DIAGNOSIS — I5042 Chronic combined systolic (congestive) and diastolic (congestive) heart failure: Secondary | ICD-10-CM

## 2021-09-24 DIAGNOSIS — R7989 Other specified abnormal findings of blood chemistry: Secondary | ICD-10-CM

## 2021-09-24 MED ORDER — PANTOPRAZOLE SODIUM 40 MG PO TBEC
40.0000 mg | DELAYED_RELEASE_TABLET | Freq: Every day | ORAL | 1 refills | Status: DC
Start: 2021-09-24 — End: 2022-04-22

## 2021-09-24 NOTE — Patient Instructions (Addendum)
Mylanta liquid use it when you get any heartburn or chest pain and see if you get relief and let me know  Check which medicine you have dropped to 1/2 tablet daily per recommendation from nephrology and if it is anything other than the Furosemide let me know.  RSV (respiratory syncitial virus) vaccine at pharmacy Covid booster when new version out late September At pharmacy High dose flu shot mid Sept to mid Oct

## 2021-09-24 NOTE — Progress Notes (Signed)
Subjective:   By signing my name below, I, [name], attest that this documentation has been prepared under the direction and in the presence of [name], MD 09/24/2021.     Patient ID: [name], female    DOB: 1940-04-12, 81 y.o.   MRN: [id number]  No chief complaint on file.  HPI Patient is in today for an office visit.  Upper chest midaxillary line pain: She complains of upper chest midaxillary line pain when sitting. She describes the pain as quick and specific to the left side of her upper back, similar to the location where she had broken her ribs. She says that she experiences this pain once weekly and it lasts for 1-2 minutes, usually in the evening.   Cardiology: She is upset with her cardiologist Dr. Mayford Knife. She reports that she was prescribed Furosemide 40 mg which caused her to feel dizzy. Her nephrologist suggested that she takes 0.5 tablets of the Furosemide and she did not like when her cardiologist agreed with this.   Nephrology: She is inquiring whether Tramadol is affecting her kidneys.   Blood sugar: She regularly checks her blood sugar. She states that the last time she checked, her blood sugar was 88 and then 124 after eating.   Past Surgical History:  Procedure Laterality Date   APPENDECTOMY  1951   TONSILLECTOMY  1950   TUBAL LIGATION  1968   Family History  Problem Relation Age of Onset   Asthma Sister    Hypertension Sister    Hyperlipidemia Sister    Uterine cancer Sister    Coronary artery disease Brother        x2   Arthritis Brother    Lung cancer Brother        smoker   Hypertension Sister    Arthritis Sister    Hyperlipidemia Sister    Emphysema Sister    COPD Sister        smoker   Heart disease Sister    Hyperlipidemia Sister    Hypertension Sister    Arthritis Sister    Emphysema Brother    Heart disease Brother         smoker   Heart attack Brother    Mental illness Father    Suicidality Father    Heart  disease Mother    Hyperlipidemia Mother    Heart attack Mother    Epilepsy Daughter    Hypertension Daughter    Obesity Daughter    COPD Brother        smoker   Lung cancer Brother    Coronary artery disease Other    Colon polyps Sister    Social History   Socioeconomic History   Marital status: Widowed    Spouse name: Not on file   Number of children: 1   Years of education: Not on file   Highest education level: Not on file  Occupational History   Occupation: Retired    Associate Professor: [employer]  Tobacco Use   Smoking status: Never    Passive exposure: Past   Smokeless tobacco: Never  Vaping Use   Vaping Use: Never used  Substance and Sexual Activity   Alcohol use: No   Drug use: No   Sexual activity: Never    Comment: lives alone, no dietary restrictions  Other Topics Concern   Not on file  Social History Narrative   Not on file   Social Determinants of Health   Financial Resource Strain:  Low Risk  (07/25/2020)   Overall Financial Resource Strain (CARDIA)    Difficulty of Paying Living Expenses: Not hard at all  Food Insecurity: No Food Insecurity (07/25/2020)   Hunger Vital Sign    Worried About Running Out of Food in the Last Year: Never true    Ran Out of Food in the Last Year: Never true  Transportation Needs: No Transportation Needs (07/25/2020)   PRAPARE - Hydrologist (Medical): No    Lack of Transportation (Non-Medical): No  Physical Activity: Inactive (07/25/2020)   Exercise Vital Sign    Days of Exercise per Week: 0 days    Minutes of Exercise per Session: 0 min  Stress: No Stress Concern Present (07/25/2020)   Sharon    Feeling of Stress : Not at all  Social Connections: Socially Isolated (07/25/2020)   Social Connection and Isolation Panel [NHANES]    Frequency of Communication with Friends and Family: More than three times a week    Frequency of Social  Gatherings with Friends and Family: More than three times a week    Attends Religious Services: Never    Marine scientist or Organizations: No    Attends Archivist Meetings: Never    Marital Status: Widowed  Intimate Partner Violence: Not At Risk (07/25/2020)   Humiliation, Afraid, Rape, and Kick questionnaire    Fear of Current or Ex-Partner: No    Emotionally Abused: No    Physically Abused: No    Sexually Abused: No   Outpatient Medications Prior to Visit  Medication Sig Dispense Refill   ACCU-CHEK GUIDE test strip CHECK BLOOD SUGAR ONE TIME WEEKLY 100 strip 1   Accu-Chek Softclix Lancets lancets CHECK BLOOD SUGAR ONE TIME WEEKLY 100 each 1   acetaminophen (TYLENOL) 500 MG tablet Take 500 mg by mouth 2 (two) times daily.      albuterol (VENTOLIN HFA) 108 (90 Base) MCG/ACT inhaler Inhale 2 puffs into the lungs every 4 (four) hours as needed for wheezing or shortness of breath. 18 g 3   Alcohol Swabs (ALCOHOL WIPES) 70 % PADS To use before checking blood sugars 100 each 1   allopurinol (ZYLOPRIM) 100 MG tablet TAKE 2 TABLETS ONE TIME DAILY 180 tablet 1   b complex vitamins tablet Take 1 tablet by mouth daily.     bacitracin ointment Apply 1 application topically 2 (two) times daily. 120 g 0   Blood Glucose Calibration (ACCU-CHEK AVIVA) SOLN USE AS DIRECTED WITH GLUCOMETER 1 each 0   blood glucose meter kit and supplies Check blood sugars once weekly. 1 each 0   Blood Glucose Monitoring Suppl (ACCU-CHEK GUIDE) w/Device KIT USE AS DIRECTED TO CHECK BLOOD SUGAR 1 kit 1   docusate sodium (COLACE) 100 MG capsule Take 1 capsule (100 mg total) by mouth 2 (two) times daily. 10 capsule 0   fluticasone (FLONASE) 50 MCG/ACT nasal spray Place 2 sprays into both nostrils daily. 48 g 1   furosemide (LASIX) 40 MG tablet TAKE 1 TABLET DAILY AND A SECOND TABLET DAILY AS NEEDED FOR INCREASED EDEMA OR WEIGHT GAIN GREATER THAN 3 POUNDS IN 24 HOURS 100 tablet 5   ipratropium-albuterol (DUONEB)  0.5-2.5 (3) MG/3ML SOLN Take 3 mLs by nebulization every 4 (four) hours as needed. 360 mL 11   loratadine (CLARITIN) 10 MG tablet Take 1 tablet (10 mg total) by mouth at bedtime. (Patient taking differently: Take 10 mg  by mouth at bedtime as needed for allergies.) 30 tablet 5   metoprolol succinate (TOPROL-XL) 50 MG 24 hr tablet Take 1 tablet (50 mg total) by mouth daily. Take with or immediately following a meal. 90 tablet 3   mupirocin ointment (BACTROBAN) 2 % Apply 1 application topically 2 (two) times daily. 30 g 0   OXYGEN Inhale 2 L into the lungs continuous.      pantoprazole (PROTONIX) 40 MG tablet Take 1 tablet (40 mg total) by mouth 2 (two) times daily. 90 tablet 1   polyethylene glycol (MIRALAX / GLYCOLAX) 17 g packet Take 17 g by mouth daily. 14 each 0   pravastatin (PRAVACHOL) 40 MG tablet TAKE 1 TABLET EVERY DAY 90 tablet 1   Probiotic Product (PROBIOTIC PO) Take 1 tablet by mouth daily.      sertraline (ZOLOFT) 100 MG tablet TAKE 1 TABLET EVERY DAY 90 tablet 0   SYMBICORT 160-4.5 MCG/ACT inhaler INHALE 2 PUFFS TWICE DAILY IN THE MORNING  AND AT BEDTIME 3 each 1   traMADol (ULTRAM) 50 MG tablet TAKE 1 TABLET BY MOUTH EVERY 6 HOURS AS NEEDED 90 tablet 0   trolamine salicylate (ASPERCREME) 10 % cream Apply 1 application topically as needed for muscle pain.     vitamin B-12 (CYANOCOBALAMIN) 1000 MCG tablet Take 1,000 mcg by mouth daily.      No facility-administered medications prior to visit.   Allergies  Allergen Reactions   Montelukast Sodium Palpitations and Other (See Comments)    REACTION: HEART PALPITATIONS, CHEST PAIN.   Sulfa Antibiotics Other (See Comments)    CHEST PAIN   Sulfonamide Derivatives Other (See Comments)    CHEST PAIN   Ace Inhibitors Other (See Comments)    PT. STATED UNKNOWN REACTION   Indomethacin Other (See Comments)    PT. STATED UNKNOWN REACTION   Review of Systems  Musculoskeletal:        (+) Upper chest midaxillary line pain.       Objective:    Physical Exam Constitutional:      General: She is not in acute distress.    Appearance: Normal appearance. She is not ill-appearing.  HENT:     Head: Normocephalic and atraumatic.     Right Ear: External ear normal.     Left Ear: External ear normal.     Mouth/Throat:     Mouth: Mucous membranes are moist.     Pharynx: Oropharynx is clear.  Eyes:     Extraocular Movements: Extraocular movements intact.     Pupils: Pupils are equal, round, and reactive to light.  Cardiovascular:     Rate and Rhythm: Normal rate and regular rhythm.     Pulses: Normal pulses.     Heart sounds: Normal heart sounds. No murmur heard.    No gallop.  Pulmonary:     Effort: Pulmonary effort is normal. No respiratory distress.     Breath sounds: Normal breath sounds. No wheezing or rales.  Abdominal:     General: Bowel sounds are normal.  Skin:    General: Skin is warm and dry.  Neurological:     Mental Status: She is alert and oriented to person, place, and time.  Psychiatric:        Mood and Affect: Mood normal.        Behavior: Behavior normal.        Judgment: Judgment normal.    There were no vitals taken for this visit. Wt Readings from Last 3  Encounters:  09/05/21 181 lb 1.3 oz (82.1 kg)  08/12/21 183 lb (83 kg)  07/05/21 181 lb 1.9 oz (82.2 kg)   Diabetic Foot Exam - Simple   No data filed    Lab Results  Component Value Date   WBC 5.1 09/05/2021   HGB 10.6 (L) 09/05/2021   HCT 34.2 (L) 09/05/2021   PLT 113 (L) 09/05/2021   GLUCOSE 124 (H) 08/09/2021   CHOL 124 11/12/2020   TRIG 106.0 11/12/2020   HDL 50.40 11/12/2020   LDLDIRECT 73.0 06/16/2017   LDLCALC 53 11/12/2020   ALT 10 08/09/2021   AST 12 (L) 08/09/2021   NA 142 08/09/2021   K 4.6 08/09/2021   CL 105 08/09/2021   CREATININE 1.46 (H) 08/09/2021   BUN 37 (H) 08/09/2021   CO2 31 08/09/2021   TSH 2.74 11/12/2020   INR 1.0 09/12/2006   HGBA1C 5.4 06/10/2021   MICROALBUR <0.7 02/23/2020   Lab  Results  Component Value Date   TSH 2.74 11/12/2020   Lab Results  Component Value Date   WBC 5.1 09/05/2021   HGB 10.6 (L) 09/05/2021   HCT 34.2 (L) 09/05/2021   MCV 100.9 (H) 09/05/2021   PLT 113 (L) 09/05/2021   Lab Results  Component Value Date   NA 142 08/09/2021   K 4.6 08/09/2021   CO2 31 08/09/2021   GLUCOSE 124 (H) 08/09/2021   BUN 37 (H) 08/09/2021   CREATININE 1.46 (H) 08/09/2021   BILITOT 0.4 08/09/2021   ALKPHOS 44 08/09/2021   AST 12 (L) 08/09/2021   ALT 10 08/09/2021   PROT 6.8 08/09/2021   ALBUMIN 4.3 08/09/2021   CALCIUM 9.6 08/09/2021   ANIONGAP 6 08/09/2021   GFR 19.43 (L) 06/10/2021   Lab Results  Component Value Date   CHOL 124 11/12/2020   Lab Results  Component Value Date   HDL 50.40 11/12/2020   Lab Results  Component Value Date   LDLCALC 53 11/12/2020   Lab Results  Component Value Date   TRIG 106.0 11/12/2020   Lab Results  Component Value Date   CHOLHDL 2 11/12/2020   Lab Results  Component Value Date   HGBA1C 5.4 06/10/2021      Assessment & Plan:   Problem List Items Addressed This Visit       Cardiovascular and Mediastinum   Essential hypertension   Chronic combined systolic and diastolic CHF (congestive heart failure) (HCC)     Respiratory   Chronic asthma, mild persistent, uncomplicated     Endocrine   Diabetes type 2, controlled (HCC)     Genitourinary   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HCC) (Chronic)   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  I, [name], personally preformed the services described in this documentation.  All medical record entries made by the scribe were at my direction and in my presence.  I have reviewed the chart and discharge instructions (if applicable) and agree that the record reflects my personal  performance and is accurate and complete. 09/24/2021  I,Mohammed Iqbal,acting as a scribe for Danise Edge, MD.,have documented all relevant documentation on the behalf of Danise Edge, MD,as directed by  Danise Edge, MD while in the presence of Danise Edge, MD.  [name]

## 2021-09-25 DIAGNOSIS — R079 Chest pain, unspecified: Secondary | ICD-10-CM | POA: Insufficient documentation

## 2021-09-25 LAB — IRON,TIBC AND FERRITIN PANEL
%SAT: 45 % (calc) (ref 16–45)
Ferritin: 397 ng/mL — ABNORMAL HIGH (ref 16–288)
Iron: 128 ug/dL (ref 45–160)
TIBC: 286 mcg/dL (calc) (ref 250–450)

## 2021-09-25 LAB — HEMOGLOBIN A1C: Hgb A1c MFr Bld: 5.5 % (ref 4.6–6.5)

## 2021-09-25 LAB — URIC ACID: Uric Acid, Serum: 6.4 mg/dL (ref 2.4–7.0)

## 2021-09-25 LAB — TSH: TSH: 3.55 u[IU]/mL (ref 0.35–5.50)

## 2021-09-25 LAB — VITAMIN D 25 HYDROXY (VIT D DEFICIENCY, FRACTURES): VITD: 60.85 ng/mL (ref 30.00–100.00)

## 2021-09-25 NOTE — Assessment & Plan Note (Signed)
Atypical, sharp pains occurring her axilla near where she suffered fractures in the past. No associated symptoms and likely musculoskeletal. EKG has not acute changes, Echo and CXR are ordered. She will notify us if pain worsens or changes.

## 2021-09-26 ENCOUNTER — Inpatient Hospital Stay: Payer: Medicare HMO

## 2021-09-26 ENCOUNTER — Inpatient Hospital Stay: Payer: Medicare HMO | Attending: Family Medicine

## 2021-09-26 VITALS — BP 105/48 | HR 90 | Temp 98.2°F | Resp 17

## 2021-09-26 DIAGNOSIS — N183 Chronic kidney disease, stage 3 unspecified: Secondary | ICD-10-CM | POA: Insufficient documentation

## 2021-09-26 DIAGNOSIS — Z79899 Other long term (current) drug therapy: Secondary | ICD-10-CM | POA: Insufficient documentation

## 2021-09-26 DIAGNOSIS — D509 Iron deficiency anemia, unspecified: Secondary | ICD-10-CM

## 2021-09-26 DIAGNOSIS — D631 Anemia in chronic kidney disease: Secondary | ICD-10-CM | POA: Diagnosis not present

## 2021-09-26 LAB — CMP (CANCER CENTER ONLY)
ALT: 8 U/L (ref 0–44)
AST: 11 U/L — ABNORMAL LOW (ref 15–41)
Albumin: 4.1 g/dL (ref 3.5–5.0)
Alkaline Phosphatase: 43 U/L (ref 38–126)
Anion gap: 6 (ref 5–15)
BUN: 59 mg/dL — ABNORMAL HIGH (ref 8–23)
CO2: 36 mmol/L — ABNORMAL HIGH (ref 22–32)
Calcium: 9.7 mg/dL (ref 8.9–10.3)
Chloride: 99 mmol/L (ref 98–111)
Creatinine: 2.46 mg/dL — ABNORMAL HIGH (ref 0.44–1.00)
GFR, Estimated: 19 mL/min — ABNORMAL LOW (ref >60)
Glucose, Bld: 106 mg/dL — ABNORMAL HIGH (ref 70–99)
Potassium: 5.2 mmol/L — ABNORMAL HIGH (ref 3.5–5.1)
Sodium: 141 mmol/L (ref 135–145)
Total Bilirubin: 0.5 mg/dL (ref 0.3–1.2)
Total Protein: 6.5 g/dL (ref 6.5–8.1)

## 2021-09-26 LAB — CBC WITH DIFFERENTIAL (CANCER CENTER ONLY)
Abs Immature Granulocytes: 0.02 10*3/uL (ref 0.00–0.07)
Basophils Absolute: 0 10*3/uL (ref 0.0–0.1)
Basophils Relative: 1 %
Eosinophils Absolute: 0.2 10*3/uL (ref 0.0–0.5)
Eosinophils Relative: 4 %
HCT: 33.5 % — ABNORMAL LOW (ref 36.0–46.0)
Hemoglobin: 10.2 g/dL — ABNORMAL LOW (ref 12.0–15.0)
Immature Granulocytes: 0 %
Lymphocytes Relative: 19 %
Lymphs Abs: 0.9 10*3/uL (ref 0.7–4.0)
MCH: 31.3 pg (ref 26.0–34.0)
MCHC: 30.4 g/dL (ref 30.0–36.0)
MCV: 102.8 fL — ABNORMAL HIGH (ref 80.0–100.0)
Monocytes Absolute: 0.6 10*3/uL (ref 0.1–1.0)
Monocytes Relative: 13 %
Neutro Abs: 3.1 10*3/uL (ref 1.7–7.7)
Neutrophils Relative %: 63 %
Platelet Count: 93 10*3/uL — ABNORMAL LOW (ref 150–400)
RBC: 3.26 MIL/uL — ABNORMAL LOW (ref 3.87–5.11)
RDW: 14.7 % (ref 11.5–15.5)
WBC Count: 4.8 10*3/uL (ref 4.0–10.5)
nRBC: 0 % (ref 0.0–0.2)

## 2021-09-26 LAB — DRUG MONITORING PANEL 376104, URINE
Amphetamines: NEGATIVE ng/mL (ref <500)
Barbiturates: NEGATIVE ng/mL (ref <300)
Benzodiazepines: NEGATIVE ng/mL (ref <100)
Cocaine Metabolite: NEGATIVE ng/mL (ref <150)
Desmethyltramadol: 569 ng/mL — ABNORMAL HIGH (ref <100)
Opiates: NEGATIVE ng/mL (ref <100)
Oxycodone: NEGATIVE ng/mL (ref <100)
Tramadol: 5484 ng/mL — ABNORMAL HIGH (ref <100)

## 2021-09-26 LAB — DM TEMPLATE

## 2021-09-26 MED ORDER — EPOETIN ALFA-EPBX 40000 UNIT/ML IJ SOLN
40000.0000 [IU] | Freq: Once | INTRAMUSCULAR | Status: AC
  Administered 2021-09-26: 40000 [IU] via SUBCUTANEOUS
  Filled 2021-09-26: qty 1

## 2021-09-26 NOTE — Patient Instructions (Signed)

## 2021-09-30 ENCOUNTER — Other Ambulatory Visit: Payer: Self-pay | Admitting: Family Medicine

## 2021-09-30 DIAGNOSIS — M549 Dorsalgia, unspecified: Secondary | ICD-10-CM

## 2021-09-30 NOTE — Telephone Encounter (Signed)
Requesting: tramadol Contract: 09/24/21 UDS:09/24/21 Last Visit:9//23 Next Visit:01/02/22 Last Refill:08/26/21  Please Advise

## 2021-10-02 ENCOUNTER — Ambulatory Visit (HOSPITAL_BASED_OUTPATIENT_CLINIC_OR_DEPARTMENT_OTHER)
Admission: RE | Admit: 2021-10-02 | Discharge: 2021-10-02 | Disposition: A | Discharge status: Home / Self Care | Payer: Medicare HMO | Source: Ambulatory Visit | Attending: Family Medicine | Admitting: Family Medicine

## 2021-10-02 DIAGNOSIS — R079 Chest pain, unspecified: Secondary | ICD-10-CM | POA: Diagnosis not present

## 2021-10-02 LAB — ECHOCARDIOGRAM COMPLETE
AR max vel: 1.46 cm2
AV Area VTI: 1.51 cm2
AV Area mean vel: 1.47 cm2
AV Mean grad: 7 mmHg
AV Peak grad: 11.2 mmHg
Ao pk vel: 1.67 m/s
Area-P 1/2: 4.74 cm2
S' Lateral: 3.5 cm

## 2021-10-02 NOTE — Progress Notes (Signed)
  Echocardiogram 2D Echocardiogram has been performed.  Elmer Ramp 10/02/2021, 12:52 PM

## 2021-10-03 ENCOUNTER — Telehealth: Payer: Self-pay

## 2021-10-03 NOTE — Telephone Encounter (Signed)
Pt called back was advised of results  And states she fine not see cardiology But Pt ask is there anything to help  The heart muscle?

## 2021-10-04 NOTE — Telephone Encounter (Signed)
Called pt Lvm of advise

## 2021-10-07 ENCOUNTER — Ambulatory Visit (INDEPENDENT_AMBULATORY_CARE_PROVIDER_SITE_OTHER): Payer: Medicare HMO

## 2021-10-07 VITALS — Ht 65.0 in | Wt 179.0 lb

## 2021-10-07 DIAGNOSIS — Z0000 Encounter for general adult medical examination without abnormal findings: Secondary | ICD-10-CM | POA: Diagnosis not present

## 2021-10-07 NOTE — Patient Instructions (Signed)
[name] , Thank you for taking time to complete your Medicare Wellness Visit. I appreciate your ongoing commitment to your health goals. Please review the following plan we discussed and let me know if I can assist you in the future.   Screening recommendations/referrals: Colonoscopy: No longer required Mammogram: Due-Declined today Bone Density: Completed 09/11/2020-Due 09/12/2022 Recommended yearly ophthalmology/optometry visit for glaucoma screening and checkup Recommended yearly dental visit for hygiene and checkup  Vaccinations: Influenza vaccine: Due-May obtain vaccine at our office or your local pharmacy. Pneumococcal vaccine: Up to date Tdap vaccine: Up to date Shingles vaccine: First dose completed-Please bring date for your chart   Covid-19:Booster available at your local pharmacy  Advanced directives: Information available at our office  Conditions/risks identified: See problem list  Next appointment: Follow up in one year for your annual wellness visit    Preventive Care 65 Years and Older, Female Preventive care refers to lifestyle choices and visits with your health care provider that can promote health and wellness. What does preventive care include? A yearly physical exam. This is also called an annual well check. Dental exams once or twice a year. Routine eye exams. Ask your health care provider how often you should have your eyes checked. Personal lifestyle choices, including: Daily care of your teeth and gums. Regular physical activity. Eating a healthy diet. Avoiding tobacco and drug use. Limiting alcohol use. Practicing safe sex. Taking low-dose aspirin every day. Taking vitamin and mineral supplements as recommended by your health care provider. What happens during an annual well check? The services and screenings done by your health care provider during your annual well check will depend on your age, overall health, lifestyle risk factors, and family  history of disease. Counseling  Your health care provider may ask you questions about your: Alcohol use. Tobacco use. Drug use. Emotional well-being. Home and relationship well-being. Sexual activity. Eating habits. History of falls. Memory and ability to understand (cognition). Work and work Statistician. Reproductive health. Screening  You may have the following tests or measurements: Height, weight, and BMI. Blood pressure. Lipid and cholesterol levels. These may be checked every 5 years, or more frequently if you are over 7 years old. Skin check. Lung cancer screening. You may have this screening every year starting at age 33 if you have a 30-pack-year history of smoking and currently smoke or have quit within the past 15 years. Fecal occult blood test (FOBT) of the stool. You may have this test every year starting at age 29. Flexible sigmoidoscopy or colonoscopy. You may have a sigmoidoscopy every 5 years or a colonoscopy every 10 years starting at age 51. Hepatitis C blood test. Hepatitis B blood test. Sexually transmitted disease (STD) testing. Diabetes screening. This is done by checking your blood sugar (glucose) after you have not eaten for a while (fasting). You may have this done every 1-3 years. Bone density scan. This is done to screen for osteoporosis. You may have this done starting at age 31. Mammogram. This may be done every 1-2 years. Talk to your health care provider about how often you should have regular mammograms. Talk with your health care provider about your test results, treatment options, and if necessary, the need for more tests. Vaccines  Your health care provider may recommend certain vaccines, such as: Influenza vaccine. This is recommended every year. Tetanus, diphtheria, and acellular pertussis (Tdap, Td) vaccine. You may need a Td booster every 10 years. Zoster vaccine. You may need this after age 74.  Pneumococcal 13-valent conjugate (PCV13)  vaccine. One dose is recommended after age 41. Pneumococcal polysaccharide (PPSV23) vaccine. One dose is recommended after age 55. Talk to your health care provider about which screenings and vaccines you need and how often you need them. This information is not intended to replace advice given to you by your health care provider. Make sure you discuss any questions you have with your health care provider. Document Released: 02/02/2015 Document Revised: 09/26/2015 Document Reviewed: 11/07/2014 Elsevier Interactive Patient Education  2017 [place] Prevention in the Home Falls can cause injuries. They can happen to people of all ages. There are many things you can do to make your home safe and to help prevent falls. What can I do on the outside of my home? Regularly fix the edges of walkways and driveways and fix any cracks. Remove anything that might make you trip as you walk through a door, such as a raised step or threshold. Trim any bushes or trees on the path to your home. Use bright outdoor lighting. Clear any walking paths of anything that might make someone trip, such as rocks or tools. Regularly check to see if handrails are loose or broken. Make sure that both sides of any steps have handrails. Any raised decks and porches should have guardrails on the edges. Have any leaves, snow, or ice cleared regularly. Use sand or salt on walking paths during winter. Clean up any spills in your garage right away. This includes oil or grease spills. What can I do in the bathroom? Use night lights. Install grab bars by the toilet and in the tub and shower. Do not use towel bars as grab bars. Use non-skid mats or decals in the tub or shower. If you need to sit down in the shower, use a plastic, non-slip stool. Keep the floor dry. Clean up any water that spills on the floor as soon as it happens. Remove soap buildup in the tub or shower regularly. Attach bath mats securely with  double-sided non-slip rug tape. Do not have throw rugs and other things on the floor that can make you trip. What can I do in the bedroom? Use night lights. Make sure that you have a light by your bed that is easy to reach. Do not use any sheets or blankets that are too big for your bed. They should not hang down onto the floor. Have a firm chair that has side arms. You can use this for support while you get dressed. Do not have throw rugs and other things on the floor that can make you trip. What can I do in the kitchen? Clean up any spills right away. Avoid walking on wet floors. Keep items that you use a lot in easy-to-reach places. If you need to reach something above you, use a strong step stool that has a grab bar. Keep electrical cords out of the way. Do not use floor polish or wax that makes floors slippery. If you must use wax, use non-skid floor wax. Do not have throw rugs and other things on the floor that can make you trip. What can I do with my stairs? Do not leave any items on the stairs. Make sure that there are handrails on both sides of the stairs and use them. Fix handrails that are broken or loose. Make sure that handrails are as long as the stairways. Check any carpeting to make sure that it is firmly attached to the stairs. Fix any  carpet that is loose or worn. Avoid having throw rugs at the top or bottom of the stairs. If you do have throw rugs, attach them to the floor with carpet tape. Make sure that you have a light switch at the top of the stairs and the bottom of the stairs. If you do not have them, ask someone to add them for you. What else can I do to help prevent falls? Wear shoes that: Do not have high heels. Have rubber bottoms. Are comfortable and fit you well. Are closed at the toe. Do not wear sandals. If you use a stepladder: Make sure that it is fully opened. Do not climb a closed stepladder. Make sure that both sides of the stepladder are locked  into place. Ask someone to hold it for you, if possible. Clearly mark and make sure that you can see: Any grab bars or handrails. First and last steps. Where the edge of each step is. Use tools that help you move around (mobility aids) if they are needed. These include: Canes. Walkers. Scooters. Crutches. Turn on the lights when you go into a dark area. Replace any light bulbs as soon as they burn out. Set up your furniture so you have a clear path. Avoid moving your furniture around. If any of your floors are uneven, fix them. If there are any pets around you, be aware of where they are. Review your medicines with your doctor. Some medicines can make you feel dizzy. This can increase your chance of falling. Ask your doctor what other things that you can do to help prevent falls. This information is not intended to replace advice given to you by your health care provider. Make sure you discuss any questions you have with your health care provider. Document Released: 11/02/2008 Document Revised: 06/14/2015 Document Reviewed: 02/10/2014 Elsevier Interactive Patient Education  2017 [employer].

## 2021-10-07 NOTE — Progress Notes (Signed)
Subjective:   [name] is a 81 y.o. female who presents for Medicare Annual (Subsequent) preventive examination.  I connected with Tiarra today by telephone and verified that I am speaking with the correct person using two identifiers. Location patient: home Location provider: work Persons participating in the virtual visit: patient, Marine scientist.    I discussed the limitations, risks, security and privacy concerns of performing an evaluation and management service by telephone and the availability of in person appointments. I also discussed with the patient that there may be a patient responsible charge related to this service. The patient expressed understanding and verbally consented to this telephonic visit.    Interactive audio and video telecommunications were attempted between this provider and patient, however failed, due to patient having technical difficulties OR patient did not have access to video capability.  We continued and completed visit with audio only.  Some vital signs may be absent or patient reported.   Time Spent with patient on telephone encounter: 30 minutes  Review of Systems     Cardiac Risk Factors include: advanced age (>10men, >86 women);hypertension;diabetes mellitus;dyslipidemia     Objective:    Today's Vitals   10/07/21 1101  Weight: 179 lb (81.2 kg)  Height: $Remove'5\' 5"'wpQxeKI$  (1.651 m)   Body mass index is 29.79 kg/m.     10/07/2021   11:11 AM 09/05/2021   10:47 AM 07/05/2021   10:34 AM 04/30/2021    1:25 PM 02/26/2021    2:20 PM 12/25/2020    8:58 AM 11/07/2020    1:29 PM  Advanced Directives  Does Patient Have a Medical Advance Directive? No Yes No No Yes Yes Yes  Type of Social research officer, government;Living will Living will;Healthcare Power of Attorney Living will;Healthcare Power of [place];Living will [employer];Living will [place]  Does patient want to make changes  to medical advance directive?  No - Patient declined  No - Patient declined No - Patient declined No - Patient declined   Copy of [place] in Chart?  No - copy requested No - copy requested No - copy requested No - copy requested No - copy requested No - copy requested  Would patient like information on creating a medical advance directive?  No - Patient declined No - Patient declined No - Patient declined No - Patient declined No - Patient declined     Current Medications (verified) Outpatient Encounter Medications as of 10/07/2021  Medication Sig   Accu-Chek Softclix Lancets lancets CHECK BLOOD SUGAR ONE TIME WEEKLY   acetaminophen (TYLENOL) 500 MG tablet Take 500 mg by mouth 2 (two) times daily.    albuterol (VENTOLIN HFA) 108 (90 Base) MCG/ACT inhaler Inhale 2 puffs into the lungs every 4 (four) hours as needed for wheezing or shortness of breath.   Alcohol Swabs (ALCOHOL WIPES) 70 % PADS To use before checking blood sugars   allopurinol (ZYLOPRIM) 100 MG tablet TAKE 2 TABLETS ONE TIME DAILY   b complex vitamins tablet Take 1 tablet by mouth daily.   bacitracin ointment Apply 1 application topically 2 (two) times daily.   Blood Glucose Calibration (ACCU-CHEK AVIVA) SOLN USE AS DIRECTED WITH GLUCOMETER   blood glucose meter kit and supplies Check blood sugars once weekly.   Blood Glucose Monitoring Suppl (ACCU-CHEK GUIDE) w/Device KIT USE AS DIRECTED TO CHECK BLOOD SUGAR   docusate sodium (COLACE) 100 MG capsule Take 1 capsule (100 mg  total) by mouth 2 (two) times daily.   fluticasone (FLONASE) 50 MCG/ACT nasal spray Place 2 sprays into both nostrils daily.   furosemide (LASIX) 40 MG tablet TAKE 1 TABLET DAILY AND A SECOND TABLET DAILY AS NEEDED FOR INCREASED EDEMA OR WEIGHT GAIN GREATER THAN 3 POUNDS IN 24 HOURS   ipratropium-albuterol (DUONEB) 0.5-2.5 (3) MG/3ML SOLN Take 3 mLs by nebulization every 4 (four) hours as needed.   loratadine (CLARITIN) 10 MG tablet Take 1  tablet (10 mg total) by mouth at bedtime. (Patient taking differently: Take 10 mg by mouth at bedtime as needed for allergies.)   metoprolol succinate (TOPROL-XL) 50 MG 24 hr tablet Take 1 tablet (50 mg total) by mouth daily. Take with or immediately following a meal.   mupirocin ointment (BACTROBAN) 2 % Apply 1 application topically 2 (two) times daily.   OXYGEN Inhale 2 L into the lungs continuous.    pantoprazole (PROTONIX) 40 MG tablet Take 1 tablet (40 mg total) by mouth daily.   polyethylene glycol (MIRALAX / GLYCOLAX) 17 g packet Take 17 g by mouth daily.   pravastatin (PRAVACHOL) 40 MG tablet TAKE 1 TABLET EVERY DAY   Probiotic Product (PROBIOTIC PO) Take 1 tablet by mouth daily.    SYMBICORT 160-4.5 MCG/ACT inhaler INHALE 2 PUFFS TWICE DAILY IN THE MORNING  AND AT BEDTIME   traMADol (ULTRAM) 50 MG tablet TAKE 1 TABLET BY MOUTH EVERY 6 HOURS AS NEEDED   trolamine salicylate (ASPERCREME) 10 % cream Apply 1 application topically as needed for muscle pain.   vitamin B-12 (CYANOCOBALAMIN) 1000 MCG tablet Take 1,000 mcg by mouth daily.    ACCU-CHEK GUIDE test strip CHECK BLOOD SUGAR ONE TIME WEEKLY   sertraline (ZOLOFT) 100 MG tablet TAKE 1 TABLET EVERY DAY   No facility-administered encounter medications on file as of 10/07/2021.    Allergies (verified) Montelukast sodium, Sulfa antibiotics, Sulfonamide derivatives, Ace inhibitors, and Indomethacin   History: Past Medical History:  Diagnosis Date   Abnormal glucose tolerance test    Abnormal TSH 09/22/2016   ACE-inhibitor cough    Anemia 03/12/2014   Arthritis    Asthma    PFT 02/06/09 FEV1 1.41 (65%), FVC 1.92 (64), FEV1% 74, TLC 3.47 (71%), DLCO 48%, +BD   Atypical chest pain    s/p cath, Normal coronaries, Non ST elevation myocardial infarction, Rt groin pseudoaneurysm   Bacterial vaginosis 03/12/2014   Cellulitis 06/22/2016   Chronic kidney disease (CKD), stage III (moderate) (HCC) 08/04/2016   COPD (chronic obstructive pulmonary  disease) (HCC)    Depression    Diastolic heart failure ([place]) 04/07/2016   DVT (deep venous thrombosis) ([place]) 1987   GERD (gastroesophageal reflux disease)    Gout    Hypercalcemia 10/15/2014   Hyperlipidemia    Hypertension    Hypoxia 10/15/2014   Macular degeneration 04/10/2015   Osteopenia 12/29/2006   Qualifier: Diagnosis of  By: Wynona Luna    Pneumonia    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   Renal insufficiency 09/22/2016   Unspecified constipation 06/05/2013   Vitamin D deficiency 01/01/2015   Past Surgical History:  Procedure Laterality Date   [place]   Family History  Problem Relation Age of Onset   Asthma Sister    Hypertension Sister    Hyperlipidemia Sister    Uterine cancer Sister    Coronary artery disease Brother        x2  Arthritis Brother    Lung cancer Brother        smoker   Hypertension Sister    Arthritis Sister    Hyperlipidemia Sister    Emphysema Sister    COPD Sister        smoker   Heart disease Sister    Hyperlipidemia Sister    Hypertension Sister    Arthritis Sister    Emphysema Brother    Heart disease Brother         smoker   Heart attack Brother    Mental illness Father    Suicidality Father    Heart disease Mother    Hyperlipidemia Mother    Heart attack Mother    Epilepsy Daughter    Hypertension Daughter    Obesity Daughter    COPD Brother        smoker   Lung cancer Brother    Coronary artery disease Other    Colon polyps Sister    Social History   Socioeconomic History   Marital status: Widowed    Spouse name: Not on file   Number of children: 1   Years of education: Not on file   Highest education level: Not on file  Occupational History   Occupation: Retired    Fish farm manager: [employer]  Tobacco Use   Smoking status: Never    Passive exposure: Past   Smokeless tobacco: Never  Vaping Use   Vaping Use: Never used  Substance and Sexual Activity    Alcohol use: No   Drug use: No   Sexual activity: Never    Comment: lives alone, no dietary restrictions  Other Topics Concern   Not on file  Social History Narrative   Not on file   Social Determinants of Health   Financial Resource Strain: Low Risk  (07/25/2020)   Overall Financial Resource Strain (CARDIA)    Difficulty of Paying Living Expenses: Not hard at all  Food Insecurity: No Food Insecurity (10/07/2021)   Hunger Vital Sign    Worried About Running Out of Food in the Last Year: Never true    [place] in the Last Year: Never true  Transportation Needs: No Transportation Needs (10/07/2021)   PRAPARE - Hydrologist (Medical): No    Lack of Transportation (Non-Medical): No  Physical Activity: Sufficiently Active (10/07/2021)   Exercise Vital Sign    Days of Exercise per Week: 7 days    Minutes of Exercise per Session: 30 min  Stress: No Stress Concern Present (10/07/2021)   [place]    Feeling of Stress : Not at all  Social Connections: Moderately Isolated (10/07/2021)   Social Connection and Isolation Panel [NHANES]    Frequency of Communication with Friends and Family: More than three times a week    Frequency of Social Gatherings with Friends and Family: Twice a week    Attends Religious Services: 1 to 4 times per year    Active Member of [employer] or Organizations: No    Attends Archivist Meetings: Never    Marital Status: Widowed    Tobacco Counseling Counseling given: Not Answered   Clinical Intake:  Pre-visit preparation completed: Yes  Pain : No/denies pain     BMI - recorded: 29.79 Nutritional Status: BMI 25 -29 Overweight Nutritional Risks: None Diabetes: Yes CBG done?: No Did pt. bring in CBG monitor from home?: No (phone visit)  How often  do you need to have someone help you when you read instructions, pamphlets, or other written materials  from your doctor or pharmacy?: 1 - Never  Diabetes:  Is the patient diabetic?  Yes  If diabetic, was a CBG obtained today?  No  Did the patient bring in their glucometer from home?  No phone visit How often do you monitor your CBG's? weekly.   Financial Strains and Diabetes Management:  Are you having any financial strains with the device, your supplies or your medication? No .  Does the patient want to be seen by Chronic Care Management for management of their diabetes?  No  Would the patient like to be referred to a Nutritionist or for Diabetic Management?  No   Diabetic Exams:  Diabetic Eye Exam: Completed 03/2021-per patient.   Diabetic Foot Exam: Pt has been advised about the importance in completing this exam. To be completed by PCP.  Interpreter Needed?: No  Information entered by :: Caroleen Hamman LPN   Activities of Daily Living    10/07/2021   11:15 AM  In your present state of health, do you have any difficulty performing the following activities:  Hearing? 1  Comment hearing aids  Vision? 0  Difficulty concentrating or making decisions? 1  Comment occasionally forgets names & why she walked in a room  Walking or climbing stairs? 1  Comment stairs  Dressing or bathing? 0  Doing errands, shopping? 0  Preparing Food and eating ? N  Using the Toilet? N  In the past six months, have you accidently leaked urine? Y  Do you have problems with loss of bowel control? N  Managing your Medications? N  Managing your Finances? N  Housekeeping or managing your Housekeeping? N    Patient Care Team: Mosie Lukes, MD as PCP - General (Family Medicine) Sueanne Margarita, MD as PCP - Cardiology (Cardiology) Shirley Muscat Loreen Freud, MD as Referring Physician (Optometry) Parrett, Fonnie Mu, NP as Nurse Practitioner (Pulmonary Disease) Rigoberto Noel, MD as Consulting Physician (Pulmonary Disease) Donato Heinz, MD as Consulting Physician (Nephrology) Warden Fillers, MD  as Consulting Physician (Ophthalmology)  Indicate any recent Medical Services you may have received from other than [facility] providers in the past year (date may be approximate).     Assessment:   This is a routine wellness examination for [place].  Hearing/Vision screen Hearing Screening - Comments:: Bilateral hearing aids Vision Screening - Comments:: Last eye exam-03/2021-[facility]  Dietary issues and exercise activities discussed: Current Exercise Habits: Home exercise routine, Type of exercise: stretching;strength training/weights, Time (Minutes): 30, Frequency (Times/Week): 7, Weekly Exercise (Minutes/Week): 210, Intensity: Mild, Exercise limited by: None identified   Goals Addressed             This Visit's Progress    Eat more fruits and vegetables   On track    Patient Stated   On track    Increase activity       Depression Screen    10/07/2021   11:15 AM 09/24/2021    2:00 PM 07/25/2020    1:50 PM 02/23/2020    3:14 PM 02/10/2019    2:54 PM 02/08/2018    1:36 PM 02/05/2017    1:19 PM  PHQ 2/9 Scores  PHQ - 2 Score 1 0 1 0 0 0 1  PHQ- 9 Score  0         Fall Risk    10/07/2021   11:13 AM 09/24/2021  2:00 PM 07/25/2020    1:49 PM 02/23/2020    3:13 PM 02/10/2019    2:53 PM  [place] in the past year? 0 0 1 0 1  Number falls in past yr: 0 0 0 0 0  Injury with Fall? 0 0 1 0 0  Risk for fall due to :   History of fall(s)    Follow up Falls prevention discussed  Falls prevention discussed  Education provided;Falls prevention discussed    FALL RISK PREVENTION PERTAINING TO THE HOME:  Any stairs in or around the home? No  Home free of loose throw rugs in walkways, pet beds, electrical cords, etc? Yes  Adequate lighting in your home to reduce risk of falls? Yes   ASSISTIVE DEVICES UTILIZED TO PREVENT FALLS:  Life alert? Yes  Use of a cane, walker or w/c? Yes walker Grab bars in the bathroom? Yes  Shower chair or bench in shower? Yes  Elevated toilet seat  or a handicapped toilet? Yes   TIMED UP AND GO:  Was the test performed? No . Phone visit   Cognitive Function:Normal cognitive status assessed by this Nurse Health Advisor. No abnormalities found.      02/05/2017    1:22 PM 01/07/2016    2:24 PM  MMSE - Mini Mental State Exam  Orientation to time 5 4  Orientation to Place 5 5  Registration 3 3  Attention/ Calculation 5 5  Recall 3 3  Language- name 2 objects 2 2  Language- repeat 1 1  Language- follow 3 step command 3 3  Language- read & follow direction 1 1  Write a sentence 1 1  Copy design 1 0  Total score 30 28        10/07/2021   11:21 AM  6CIT Screen  What Year? 0 points  What month? 0 points  What time? 0 points  Count back from 20 0 points  Months in reverse 0 points  Repeat phrase 0 points  Total Score 0 points    Immunizations Immunization History  Administered Date(s) Administered   Fluad Quad(high Dose 65+) 11/11/2018   Influenza Split 12/08/2011   Influenza Whole 10/20/2006, 11/09/2007, 11/14/2008, 10/30/2009   Influenza, High Dose Seasonal PF 09/25/2015, 10/21/2015, 10/26/2017   Influenza,inj,Quad PF,6+ Mos 10/20/2012, 11/28/2013, 10/02/2014, 09/18/2016   PFIZER(Purple Top)SARS-COV-2 Vaccination 03/19/2019, 05/03/2019, 01/04/2020   Pneumococcal Conjugate-13 02/21/2013   Pneumococcal Polysaccharide-23 06/30/2006   Td 06/01/2007, 07/25/2020    TDAP status: Up to date  Flu Vaccine status: Due, Education has been provided regarding the importance of this vaccine. Advised may receive this vaccine at local pharmacy or Health Dept. Aware to provide a copy of the vaccination record if obtained from local pharmacy or Health Dept. Verbalized acceptance and understanding.  Pneumococcal vaccine status: Up to date  Covid-19 vaccine status: Information provided on how to obtain vaccines.   Qualifies for Shingles Vaccine? Yes   Zostavax completed No   Shingrix Completed?: No.    Education has been  provided regarding the importance of this vaccine. Patient has been advised to call insurance company to determine out of pocket expense if they have not yet received this vaccine. Advised may also receive vaccine at local pharmacy or Health Dept. Verbalized acceptance and understanding.  Screening Tests Health Maintenance  Topic Date Due   Zoster Vaccines- Shingrix (1 of 2) Never done   COLONOSCOPY (Pts 45-73yrs Insurance coverage will need to be confirmed)  11/10/2018  OPHTHALMOLOGY EXAM  07/28/2019   Diabetic kidney evaluation - Urine ACR  05/14/2021   FOOT EXAM  06/26/2021   INFLUENZA VACCINE  08/20/2021   COVID-19 Vaccine (4 - Pfizer risk series) 01/17/2022 (Originally 02/29/2020)   HEMOGLOBIN A1C  03/25/2022   Diabetic kidney evaluation - GFR measurement  09/27/2022   TETANUS/TDAP  07/26/2030   Pneumonia Vaccine 31+ Years old  Completed   DEXA SCAN  Completed   HPV VACCINES  Aged Out    Health Maintenance  Health Maintenance Due  Topic Date Due   Zoster Vaccines- Shingrix (1 of 2) Never done   COLONOSCOPY (Pts 45-54yrs Insurance coverage will need to be confirmed)  11/10/2018   OPHTHALMOLOGY EXAM  07/28/2019   Diabetic kidney evaluation - Urine ACR  05/14/2021   FOOT EXAM  06/26/2021   INFLUENZA VACCINE  08/20/2021    Colorectal cancer screening: No longer required.   Mammogram status: Due-Declined today  Bone Density status: Completed 09/11/2020. Results reflect: Bone density results: OSTEOPOROSIS. Repeat every 2 years.  Lung Cancer Screening: (Low Dose CT Chest recommended if Age 55-80 years, 30 pack-year currently smoking OR have quit w/in 15years.) does not qualify.    Additional Screening:  Hepatitis C Screening: does not qualify  Vision Screening: Recommended annual ophthalmology exams for early detection of glaucoma and other disorders of the eye. Is the patient up to date with their annual eye exam?  Yes  Who is the provider or what is the name of the office  in which the patient attends annual eye exams? [facility]   Dental Screening: Recommended annual dental exams for proper oral hygiene  Community Resource Referral / Chronic Care Management: CRR required this visit?  No   CCM required this visit?  No      Plan:     I have personally reviewed and noted the following in the patient's chart:   Medical and social history Use of alcohol, tobacco or illicit drugs  Current medications and supplements including opioid prescriptions. Patient is not currently taking opioid prescriptions. Functional ability and status Nutritional status Physical activity Advanced directives List of other physicians Hospitalizations, surgeries, and ER visits in previous 12 months Vitals Screenings to include cognitive, depression, and falls Referrals and appointments  In addition, I have reviewed and discussed with patient certain preventive protocols, quality metrics, and best practice recommendations. A written personalized care plan for preventive services as well as general preventive health recommendations were provided to patient.   Due to this being a telephonic visit, the after visit summary with patients personalized plan was offered to patient via mail or my-chart. Patient would like to access on my-chart.   Marta Antu, LPN   1/84/0375  Nurse Health Advisor  Nurse Notes: None

## 2021-10-17 ENCOUNTER — Inpatient Hospital Stay: Payer: Medicare HMO

## 2021-10-17 DIAGNOSIS — D631 Anemia in chronic kidney disease: Secondary | ICD-10-CM | POA: Diagnosis not present

## 2021-10-17 DIAGNOSIS — N183 Chronic kidney disease, stage 3 unspecified: Secondary | ICD-10-CM | POA: Diagnosis not present

## 2021-10-17 DIAGNOSIS — D509 Iron deficiency anemia, unspecified: Secondary | ICD-10-CM

## 2021-10-17 DIAGNOSIS — Z79899 Other long term (current) drug therapy: Secondary | ICD-10-CM | POA: Diagnosis not present

## 2021-10-17 LAB — CBC WITH DIFFERENTIAL (CANCER CENTER ONLY)
Abs Immature Granulocytes: 0.02 10*3/uL (ref 0.00–0.07)
Basophils Absolute: 0 10*3/uL (ref 0.0–0.1)
Basophils Relative: 1 %
Eosinophils Absolute: 0.3 10*3/uL (ref 0.0–0.5)
Eosinophils Relative: 5 %
HCT: 35.4 % — ABNORMAL LOW (ref 36.0–46.0)
Hemoglobin: 11 g/dL — ABNORMAL LOW (ref 12.0–15.0)
Immature Granulocytes: 0 %
Lymphocytes Relative: 24 %
Lymphs Abs: 1.2 10*3/uL (ref 0.7–4.0)
MCH: 31.2 pg (ref 26.0–34.0)
MCHC: 31.1 g/dL (ref 30.0–36.0)
MCV: 100.3 fL — ABNORMAL HIGH (ref 80.0–100.0)
Monocytes Absolute: 0.7 10*3/uL (ref 0.1–1.0)
Monocytes Relative: 13 %
Neutro Abs: 3 10*3/uL (ref 1.7–7.7)
Neutrophils Relative %: 57 %
Platelet Count: 110 10*3/uL — ABNORMAL LOW (ref 150–400)
RBC: 3.53 MIL/uL — ABNORMAL LOW (ref 3.87–5.11)
RDW: 14.5 % (ref 11.5–15.5)
WBC Count: 5.2 10*3/uL (ref 4.0–10.5)
nRBC: 0 % (ref 0.0–0.2)

## 2021-10-17 LAB — CMP (CANCER CENTER ONLY)
ALT: 10 U/L (ref 0–44)
AST: 13 U/L — ABNORMAL LOW (ref 15–41)
Albumin: 4.3 g/dL (ref 3.5–5.0)
Alkaline Phosphatase: 41 U/L (ref 38–126)
Anion gap: 6 (ref 5–15)
BUN: 56 mg/dL — ABNORMAL HIGH (ref 8–23)
CO2: 35 mmol/L — ABNORMAL HIGH (ref 22–32)
Calcium: 10.7 mg/dL — ABNORMAL HIGH (ref 8.9–10.3)
Chloride: 100 mmol/L (ref 98–111)
Creatinine: 2.05 mg/dL — ABNORMAL HIGH (ref 0.44–1.00)
GFR, Estimated: 24 mL/min — ABNORMAL LOW (ref >60)
Glucose, Bld: 93 mg/dL (ref 70–99)
Potassium: 4.6 mmol/L (ref 3.5–5.1)
Sodium: 141 mmol/L (ref 135–145)
Total Bilirubin: 0.4 mg/dL (ref 0.3–1.2)
Total Protein: 6.9 g/dL (ref 6.5–8.1)

## 2021-10-17 NOTE — Progress Notes (Signed)
Reviewed labs with patient and patient does not meet requirements for injection. Pt states she has been feeling good and has no complaints.Pt given schedule for next appointment. Pt verbalized understanding of plan and had no further questions. Pt left waiting room ambulatory in no apparent distress.

## 2021-11-04 DIAGNOSIS — M545 Low back pain, unspecified: Secondary | ICD-10-CM | POA: Diagnosis not present

## 2021-11-04 DIAGNOSIS — R8281 Pyuria: Secondary | ICD-10-CM | POA: Diagnosis not present

## 2021-11-05 ENCOUNTER — Other Ambulatory Visit: Payer: Self-pay | Admitting: Family Medicine

## 2021-11-05 DIAGNOSIS — M549 Dorsalgia, unspecified: Secondary | ICD-10-CM

## 2021-11-05 NOTE — Telephone Encounter (Signed)
Requesting: tramadol '50mg'$   Contract: 09/24/21 UDS: 09/24/21 Last Visit: 09/24/21 Next Visit: 01/02/22 Last Refill: 09/30/21 #90 and 0RF   Please Advise

## 2021-11-06 DIAGNOSIS — I129 Hypertensive chronic kidney disease with stage 1 through stage 4 chronic kidney disease, or unspecified chronic kidney disease: Secondary | ICD-10-CM | POA: Diagnosis not present

## 2021-11-06 DIAGNOSIS — N184 Chronic kidney disease, stage 4 (severe): Secondary | ICD-10-CM | POA: Diagnosis not present

## 2021-11-06 DIAGNOSIS — D631 Anemia in chronic kidney disease: Secondary | ICD-10-CM | POA: Diagnosis not present

## 2021-11-06 DIAGNOSIS — E1122 Type 2 diabetes mellitus with diabetic chronic kidney disease: Secondary | ICD-10-CM | POA: Diagnosis not present

## 2021-11-06 DIAGNOSIS — N390 Urinary tract infection, site not specified: Secondary | ICD-10-CM | POA: Diagnosis not present

## 2021-11-07 ENCOUNTER — Other Ambulatory Visit: Payer: Self-pay | Admitting: Family Medicine

## 2021-11-07 ENCOUNTER — Inpatient Hospital Stay: Payer: Medicare HMO | Attending: Family Medicine

## 2021-11-07 ENCOUNTER — Inpatient Hospital Stay: Payer: Medicare HMO

## 2021-11-07 DIAGNOSIS — D631 Anemia in chronic kidney disease: Secondary | ICD-10-CM | POA: Insufficient documentation

## 2021-11-07 DIAGNOSIS — Z79899 Other long term (current) drug therapy: Secondary | ICD-10-CM | POA: Insufficient documentation

## 2021-11-07 DIAGNOSIS — N183 Chronic kidney disease, stage 3 unspecified: Secondary | ICD-10-CM | POA: Insufficient documentation

## 2021-11-13 ENCOUNTER — Inpatient Hospital Stay: Payer: Medicare HMO

## 2021-11-13 VITALS — BP 117/74 | HR 87 | Temp 97.9°F | Resp 16

## 2021-11-13 DIAGNOSIS — D631 Anemia in chronic kidney disease: Secondary | ICD-10-CM

## 2021-11-13 DIAGNOSIS — D509 Iron deficiency anemia, unspecified: Secondary | ICD-10-CM

## 2021-11-13 DIAGNOSIS — N184 Chronic kidney disease, stage 4 (severe): Secondary | ICD-10-CM

## 2021-11-13 DIAGNOSIS — Z79899 Other long term (current) drug therapy: Secondary | ICD-10-CM | POA: Diagnosis not present

## 2021-11-13 DIAGNOSIS — N183 Chronic kidney disease, stage 3 unspecified: Secondary | ICD-10-CM | POA: Diagnosis not present

## 2021-11-13 LAB — CBC WITH DIFFERENTIAL (CANCER CENTER ONLY)
Abs Immature Granulocytes: 0.01 10*3/uL (ref 0.00–0.07)
Basophils Absolute: 0 10*3/uL (ref 0.0–0.1)
Basophils Relative: 0 %
Eosinophils Absolute: 0.3 10*3/uL (ref 0.0–0.5)
Eosinophils Relative: 5 %
HCT: 31.9 % — ABNORMAL LOW (ref 36.0–46.0)
Hemoglobin: 9.8 g/dL — ABNORMAL LOW (ref 12.0–15.0)
Immature Granulocytes: 0 %
Lymphocytes Relative: 23 %
Lymphs Abs: 1.2 10*3/uL (ref 0.7–4.0)
MCH: 31.2 pg (ref 26.0–34.0)
MCHC: 30.7 g/dL (ref 30.0–36.0)
MCV: 101.6 fL — ABNORMAL HIGH (ref 80.0–100.0)
Monocytes Absolute: 0.6 10*3/uL (ref 0.1–1.0)
Monocytes Relative: 12 %
Neutro Abs: 3.1 10*3/uL (ref 1.7–7.7)
Neutrophils Relative %: 60 %
Platelet Count: 97 10*3/uL — ABNORMAL LOW (ref 150–400)
RBC: 3.14 MIL/uL — ABNORMAL LOW (ref 3.87–5.11)
RDW: 14.4 % (ref 11.5–15.5)
WBC Count: 5.1 10*3/uL (ref 4.0–10.5)
nRBC: 0 % (ref 0.0–0.2)

## 2021-11-13 LAB — CMP (CANCER CENTER ONLY)
ALT: 11 U/L (ref 0–44)
AST: 15 U/L (ref 15–41)
Albumin: 4.2 g/dL (ref 3.5–5.0)
Alkaline Phosphatase: 39 U/L (ref 38–126)
Anion gap: 6 (ref 5–15)
BUN: 58 mg/dL — ABNORMAL HIGH (ref 8–23)
CO2: 33 mmol/L — ABNORMAL HIGH (ref 22–32)
Calcium: 9.2 mg/dL (ref 8.9–10.3)
Chloride: 102 mmol/L (ref 98–111)
Creatinine: 1.92 mg/dL — ABNORMAL HIGH (ref 0.44–1.00)
GFR, Estimated: 26 mL/min — ABNORMAL LOW (ref >60)
Glucose, Bld: 126 mg/dL — ABNORMAL HIGH (ref 70–99)
Potassium: 4.5 mmol/L (ref 3.5–5.1)
Sodium: 141 mmol/L (ref 135–145)
Total Bilirubin: 0.5 mg/dL (ref 0.3–1.2)
Total Protein: 6.4 g/dL — ABNORMAL LOW (ref 6.5–8.1)

## 2021-11-13 MED ORDER — EPOETIN ALFA-EPBX 40000 UNIT/ML IJ SOLN
40000.0000 [IU] | Freq: Once | INTRAMUSCULAR | Status: AC
  Administered 2021-11-13: 40000 [IU] via SUBCUTANEOUS
  Filled 2021-11-13: qty 1

## 2021-11-13 NOTE — Patient Instructions (Signed)

## 2021-11-17 NOTE — Progress Notes (Unsigned)
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 around 2017 referred to pulmonary clinic 02/22/2018 by Dr   Gwyneth Revels  With pft 04/26/13 c/w minimal airflow obst by curvature of f/v though overall pattern was restrictive assoc with mild kyphosis and chronic L HD elevation   History of Present Illness  02/22/2018  Pulmonary/ 1st office eval/[name]  Chief Complaint  Patient presents with   Consult    Shortness of breath  Dyspnea:  MMRC2 = can't walk a nl pace on a flat grade s sob but does fine slow and flat only a little better with 02  Cough: constant sense something stuck in throat but min mucoid production - worse with exp to smoke or strong scents  Sleep: 45 degrees with electric bed  - lower levels has more trouble breathng SABA use: increased use since d/c'd singulair  rec Plan A = Automatic = Symbicort 160 Take 2 puffs first thing in am and then another 2 puffs about 12 hours later.  Work on inhaler technique:     Plan B = Backup Only use your albuterol inhaler  Plan C = Crisis - only use your albuterol nebulizer if you first try Plan B and it fails to help > ok to use the nebulizer up to every 4 hours but if start needing it regularly call for immediate appointment Goal is to keep your 02 saturations above 90%  Please schedule a follow up office visit in 4 weeks, sooner if needed with pfts on return     03/22/2018  f/u ov/[name] re:  ? Asthma on symb 160 poor hfa/ singulair  Chief Complaint  Patient presents with   Follow-up    PFT's done today. Breathing is overall doing well. She is using her albuterol inhaler 5 x per wk on average.   Dyspnea:  MMRC2 = can't walk a nl pace on a flat grade s sob but does fine slow and flat shopping  Cough:  minimal  Sleeping: 45 degrees x 5 years  SABA use: way too much, used neb 3 h prior to pft testing "thought I was supposed to be using twice daily" - it's what I was told (see last ov that occurred well after she was told this) 02: 2lpm 24/7  rec Plan A = Automatic = Symbicort 160 Take 2 puffs first thing in am and then another 2 puffs about 12 hours later.  Work on inhaler technique:  Plan B = Backup Only use your albuterol inhaler as a rescue medication Plan C = Crisis - only use your albuterol nebulizer if you first try Plan B Goal is to keep your 02 saturations above 90%    11/11/2018  f/u ov/[name] re:  Chronic asthma / 02 dep  Chief Complaint  Patient presents with   Follow-up    Breathing is overall doing well and no new co's. She is using her albuterol inhaler 5 x per wk and rarely uses neb.    Dyspnea:  Slow shopping x [employer] = MMRC2 = can't walk a nl pace on a flat grade s sob but does fine slow and flat on 1 lpm Cough: none Sleeping: 45 degrees elevated x years  SABA use: occ if overdo it  02: 1lpm 24/7 hs and 2lpm with activity on POC  rec We will order a home 02 concentrator today at 1lpm  For now 02 rec is for 1lpm 24/7  Please schedule a follow up visit in 6 months but call sooner if needed  -correction :  02 flow should be 2lpm per POC      08/12/2021  f/u ov/[name] re: chronic asthma   maint on symbicort   Chief Complaint  Patient presents with   Follow-up    Pt states she is having problems breathing x1 month ago with exertion. Pt states she has been diagnosed with COPD and Asthma and Bronchitis.   Dyspnea:  sev hundred feet flat on 2lpm POC Cough: p neb clear mucus  Sleeping: no problems 30 degrees electric bed  SABA use: 3-4 days p ex only - neb up to twice daily  02: 24/7 @  2lpm / on POC but not titrating  Covid status:   x  3  Rec Plan A = Automatic = Always=    symbicort 160 Take 2 puffs first thing in am and then another 2 puffs about 12 hours later.   Work on inhaler technique:  relax  Plan B = Backup (to supplement plan A, not to replace it) Only use your albuterol inhaler as a rescue medication Plan C = Crisis (instead of Plan B but only if Plan B stops working) - only use your duoneb per nebulizer if you first try Vance to try albuterol 15 min before an activity (on alternating days)  that you know would usually make you short of breath Make sure you check your oxygen saturation  AT  your highest level of activity (not after you stop)   to be sure it stays over 90%    Please schedule a follow up visit in 3 months but call sooner if needed  with all medications /inhalers/ solutions in hand     11/18/2021  f/u ov/[name] re: chronic asthma   maint on symbicort 160 / suboptimal technique No chief complaint on file. Dyspnea:  walking around house mostly / mailbox on 2 lpm does not check sats  Cough: min mucoid  Sleeping: 30 degrees hob electric  SABA use: 2-3 per week 02: 1lpm hs and up to 2pm with activity      No obvious day to day or daytime variability or assoc excess/ purulent sputum or mucus plugs or hemoptysis or cp or chest tightness, subjective wheeze or overt sinus or hb symptoms.   Sleeping as above without nocturnal  or early am exacerbation  of respiratory  c/o's or need for noct saba. Also denies any obvious fluctuation of symptoms with weather or environmental changes or other aggravating or alleviating factors except as outlined above   No unusual exposure hx or h/o childhood pna/ asthma or knowledge of premature birth.  Current Allergies, Complete Past Medical History, Past Surgical History, Family History, and Social History were reviewed in [employer] record.  ROS  The following are not active complaints unless bolded Hoarseness, sore throat, dysphagia, dental problems, itching, sneezing,  nasal congestion or discharge of excess mucus or purulent secretions, ear ache,   fever, chills,  sweats, unintended wt loss or wt gain, classically pleuritic or exertional cp,  orthopnea pnd or arm/hand swelling  or leg swelling, presyncope, palpitations, abdominal pain, anorexia, nausea, vomiting, diarrhea  or change in bowel habits or change in  bladder habits, change in stools or change in urine, dysuria, hematuria,  rash, arthralgias, visual complaints, headache, numbness, weakness or ataxia or problems with walking or coordination,  change in mood or  memory.        Current Meds  Medication Sig   ACCU-CHEK GUIDE test strip CHECK BLOOD SUGAR ONE TIME WEEKLY   Accu-Chek Softclix Lancets lancets CHECK BLOOD SUGAR ONE TIME WEEKLY   acetaminophen (TYLENOL) 500 MG tablet Take 500 mg by mouth 2 (two) times daily.    albuterol (VENTOLIN HFA) 108 (90 Base) MCG/ACT inhaler Inhale 2 puffs into the lungs every 4 (four) hours as needed for wheezing or shortness of breath.   Alcohol Swabs (DROPSAFE ALCOHOL PREP) 70 % PADS USE AS DIRECTED  BEFORE  CHECKING BLOOD SUGAR   allopurinol (ZYLOPRIM) 100 MG tablet TAKE 2 TABLETS ONE TIME DAILY   b complex vitamins tablet Take 1 tablet by mouth daily.   bacitracin ointment Apply 1 application topically 2 (two) times daily.   Blood Glucose Calibration (ACCU-CHEK AVIVA) SOLN USE AS DIRECTED WITH GLUCOMETER   blood glucose meter kit and supplies Check blood sugars once weekly.   Blood Glucose Monitoring Suppl (ACCU-CHEK GUIDE) w/Device KIT USE AS DIRECTED TO CHECK BLOOD SUGAR   docusate sodium (COLACE) 100 MG capsule Take 1 capsule (100 mg total) by mouth 2 (two) times daily.   fluticasone (FLONASE) 50 MCG/ACT nasal spray Place 2 sprays into both nostrils daily.   furosemide (LASIX) 40 MG tablet TAKE 1 TABLET DAILY AND A SECOND TABLET DAILY AS NEEDED FOR INCREASED EDEMA OR WEIGHT GAIN GREATER THAN 3 POUNDS IN 24 HOURS   ipratropium-albuterol (DUONEB) 0.5-2.5 (3) MG/3ML SOLN Take 3 mLs by nebulization every 4 (four) hours as needed.   loratadine (CLARITIN) 10 MG  tablet Take 1 tablet (10 mg total) by mouth at bedtime. (Patient taking differently: Take 10 mg by mouth at bedtime as needed for allergies.)   metoprolol succinate (TOPROL-XL) 50 MG 24 hr tablet Take 1 tablet (50 mg total) by mouth daily. Take with or immediately following a meal.   mupirocin ointment (BACTROBAN) 2 % Apply 1 application topically 2 (two) times daily.   OXYGEN Inhale 2 L into the lungs continuous.    pantoprazole (PROTONIX) 40 MG tablet Take 1 tablet (40 mg total) by mouth daily.   polyethylene glycol (MIRALAX / GLYCOLAX) 17 g packet Take 17 g by mouth daily.   pravastatin (PRAVACHOL) 40 MG tablet TAKE 1 TABLET EVERY DAY   Probiotic Product (PROBIOTIC PO) Take 1 tablet by mouth daily.    SYMBICORT 160-4.5 MCG/ACT inhaler INHALE 2 PUFFS TWICE DAILY IN THE MORNING  AND AT BEDTIME   traMADol (ULTRAM) 50 MG tablet TAKE 1 TABLET BY MOUTH EVERY 6 HOURS AS NEEDED   trolamine salicylate (ASPERCREME) 10 % cream Apply 1 application topically as needed for muscle pain.   vitamin B-12 (CYANOCOBALAMIN) 1000 MCG tablet Take 1,000 mcg by mouth daily.                    Objective:    wts  11/18/2021      184   08/12/2021        183  05/30/2019        180 11/11/2018      188   03/22/18 190 lb (86.2 kg)  02/22/18 197 lb (89.4 kg)  02/18/18 189 lb 1.9 oz (85.8 kg)      Vital signs reviewed  11/18/2021  - Note  at rest 02 sats  98% on 2lpm   General appearance:    amb wf nad      HEENT : Oropharynx  clear/ top dentures  Nasal turbinates nl    NECK :  without  apparent JVD/ palpable Nodes/TM    LUNGS: no acc muscle use,  Min barrel  contour chest wall with bilateral  slightly decreased bs s audible wheeze and  without cough on insp or exp maneuvers and min  Hyperresonant  to  percussion bilaterally    CV:  RRR  no s3 or murmur or increase in P2, and R> L LE pitting edema   ABD:  soft and nontender with pos end  insp Hoover's  in the supine position.  No bruits or  organomegaly appreciated   MS:  Nl gait/ ext warm without deformities Or obvious joint restrictions  calf tenderness, cyanosis or clubbing     SKIN: warm and dry without lesions    NEURO:  alert, approp, nl sensorium with  no motor or cerebellar deficits apparent.                         Assessment

## 2021-11-18 ENCOUNTER — Ambulatory Visit: Payer: Medicare HMO | Admitting: Internal Medicine

## 2021-11-18 ENCOUNTER — Encounter: Payer: Self-pay | Admitting: Internal Medicine

## 2021-11-18 VITALS — BP 126/76 | HR 93 | Temp 98.2°F | Ht 65.0 in | Wt 184.4 lb

## 2021-11-18 DIAGNOSIS — J9612 Chronic respiratory failure with hypercapnia: Secondary | ICD-10-CM

## 2021-11-18 DIAGNOSIS — Z23 Encounter for immunization: Secondary | ICD-10-CM

## 2021-11-18 DIAGNOSIS — J9611 Chronic respiratory failure with hypoxia: Secondary | ICD-10-CM

## 2021-11-18 DIAGNOSIS — J4530 Mild persistent asthma, uncomplicated: Secondary | ICD-10-CM | POA: Diagnosis not present

## 2021-11-18 NOTE — Patient Instructions (Signed)
Work on inhaler technique:  relax and gently blow all the way out then take a nice smooth full deep breath back in, triggering the inhaler at same time you start breathing in.  Hold breath in for at least  5 seconds if you can. Blow out symbicort 160  thru nose. Rinse and gargle with water when done.  If mouth or throat bother you at all,  try brushing teeth/gums/tongue with arm and hammer toothpaste/ make a slurry and gargle and spit out.   Use the empty symbicort device to practice before using the real one  Make sure you check your oxygen saturation  AT  your highest level of activity (NOT after you stop)   to be sure it stays over 90% and adjust  02 flow upward to maintain this level if needed but remember to turn it back to previous settings when you stop (to conserve your supply).    Also  Ok to try albuterol 15 min before an activity (on alternating days)  that you know would usually make you short of breath and see if it makes any difference and if makes none then don't take albuterol after activity unless you can't catch your breath as this means it's the resting that helps, not the albuterol.      Please schedule a follow up visit in 12  months but call sooner if needed

## 2021-11-19 ENCOUNTER — Encounter: Payer: Self-pay | Admitting: Internal Medicine

## 2021-11-19 NOTE — Assessment & Plan Note (Signed)
HCO3   02/18/2018   = 31  02/22/2018  Room Air at Rest = 88%  >>> while Ambulating = 87% and  on 3 Liters of oxygen while Ambulating = 94% - 03/22/2018   Walked 2lpm   1 laps @  approx 225f each @ slow pace  stopped due to  Sob/desats resolved on 3lpm  And finished second lap s desats but stopped nonetheless with sob 11/11/2018  02 qualify Patient Saturations on Room Air at Rest = 100% andwhile Ambulating = 87% Patient Saturations on 2 Liters of oxygen while Ambulating = 94% - HC03  08/09/21  = 31  - 08/12/2021   Walked on 2lpm POC  x  2  lap(s) =  approx 300  ft  @ slow/rollator pace, stopped due to sob with lowest 02 sats  91%   Again advised: Make sure you check your oxygen saturation  AT  your highest level of activity (not after you stop)   to be sure it stays over 90% and adjust  02 flow upward to maintain this level if needed but remember to turn it back to previous settings when you stop (to conserve your supply).          Each maintenance medication was reviewed in detail including emphasizing most importantly the difference between maintenance and prns and under what circumstances the prns are to be triggered using an action plan format where appropriate.  Total time for H and P, chart review, counseling, reviewing hfa/02 device(s) and generating customized AVS unique to this office visit / same day charting = 21 min

## 2021-11-19 NOTE — Assessment & Plan Note (Signed)
Never smoker - Allergy profile 03/11/13  IgE 11 RAST  - Alpha one AT Screen 03/11/13  Level 167  PFT's  04/26/13   FEV1 1.47 (67 % ) ratio 0.77  p no % improvement from saba p nothing prior to study with DLCO  64 % corrects to 77 % for alv volume with mild curvature   - PFT's  03/22/2018  FEV1 1.11 (54 % ) ratio 0.70  p no % improvement from saba p neb saba 3 h  prior to study with DLCO  53 % corrects to 90 % for alv volume and ERV 16%    With mild curvature  - 02/22/2018   continue symbicort 160 2bid  - 11/18/2021  After extensive coaching inhaler device,  effectiveness =    50% (short ti, short breath hold)   Despite poor hfa, All goals of chronic asthma control met including optimal (though certainly not nl)  function and elimination of symptoms with minimal need for rescue therapy.  Contingencies discussed in full including contacting this office immediately if not controlling the symptoms using the rule of two's.     F/u 12 m same rx, sooner prn

## 2021-12-06 ENCOUNTER — Inpatient Hospital Stay: Payer: Medicare HMO

## 2021-12-06 ENCOUNTER — Inpatient Hospital Stay: Payer: Medicare HMO | Admitting: Family

## 2021-12-06 ENCOUNTER — Inpatient Hospital Stay: Payer: Medicare HMO | Attending: Family Medicine

## 2021-12-09 NOTE — Telephone Encounter (Signed)
Prolia VOB initiated via [email or web address] 

## 2021-12-11 ENCOUNTER — Other Ambulatory Visit (HOSPITAL_COMMUNITY): Payer: Self-pay

## 2021-12-11 NOTE — Telephone Encounter (Signed)
Pt ready for scheduling on or after 01/09/22   Out-of-pocket cost due at time of visit: $301   Primary: [employer] Prolia co-insurance: 20% (approximately $276) Admin fee co-insurance: 20% (approximately $25)   Secondary: n/a Prolia co-insurance:  Admin fee co-insurance:    Deductible: does not apply   Prior Auth: APPROVED PA# [id number]; Key: [id number] Valid: 06/05/21-01/19/22   ** This summary of benefits is an estimation of the patient's out-of-pocket cost. Exact cost may vary based on individual plan coverage.

## 2021-12-11 NOTE — Telephone Encounter (Signed)
Pharmacy Patient Advocate Encounter  Insurance verification completed.    The patient is insured through [employer] test claims for: Prolia '60mg'$ .  Pharmacy benefit copay: $95.00

## 2021-12-13 ENCOUNTER — Other Ambulatory Visit: Payer: Self-pay | Admitting: Family Medicine

## 2021-12-13 DIAGNOSIS — M549 Dorsalgia, unspecified: Secondary | ICD-10-CM

## 2021-12-26 ENCOUNTER — Encounter: Payer: Self-pay | Admitting: Medical Oncology

## 2021-12-26 ENCOUNTER — Inpatient Hospital Stay: Payer: Medicare HMO

## 2021-12-26 ENCOUNTER — Inpatient Hospital Stay (HOSPITAL_BASED_OUTPATIENT_CLINIC_OR_DEPARTMENT_OTHER): Payer: Medicare HMO | Admitting: Medical Oncology

## 2021-12-26 ENCOUNTER — Inpatient Hospital Stay: Payer: Medicare HMO | Attending: Family Medicine

## 2021-12-26 ENCOUNTER — Other Ambulatory Visit: Payer: Self-pay

## 2021-12-26 VITALS — BP 123/67 | HR 72 | Temp 98.9°F | Wt 179.1 lb

## 2021-12-26 DIAGNOSIS — N183 Chronic kidney disease, stage 3 unspecified: Secondary | ICD-10-CM | POA: Insufficient documentation

## 2021-12-26 DIAGNOSIS — D631 Anemia in chronic kidney disease: Secondary | ICD-10-CM

## 2021-12-26 DIAGNOSIS — N184 Chronic kidney disease, stage 4 (severe): Secondary | ICD-10-CM | POA: Diagnosis not present

## 2021-12-26 DIAGNOSIS — D508 Other iron deficiency anemias: Secondary | ICD-10-CM | POA: Diagnosis not present

## 2021-12-26 DIAGNOSIS — Z79899 Other long term (current) drug therapy: Secondary | ICD-10-CM | POA: Diagnosis not present

## 2021-12-26 DIAGNOSIS — D509 Iron deficiency anemia, unspecified: Secondary | ICD-10-CM

## 2021-12-26 LAB — CBC WITH DIFFERENTIAL (CANCER CENTER ONLY)
Abs Immature Granulocytes: 0.1 10*3/uL — ABNORMAL HIGH (ref 0.00–0.07)
Basophils Absolute: 0 10*3/uL (ref 0.0–0.1)
Basophils Relative: 0 %
Eosinophils Absolute: 0.2 10*3/uL (ref 0.0–0.5)
Eosinophils Relative: 3 %
HCT: 32.2 % — ABNORMAL LOW (ref 36.0–46.0)
Hemoglobin: 10.1 g/dL — ABNORMAL LOW (ref 12.0–15.0)
Immature Granulocytes: 1 %
Lymphocytes Relative: 17 %
Lymphs Abs: 1.2 10*3/uL (ref 0.7–4.0)
MCH: 31.8 pg (ref 26.0–34.0)
MCHC: 31.4 g/dL (ref 30.0–36.0)
MCV: 101.3 fL — ABNORMAL HIGH (ref 80.0–100.0)
Monocytes Absolute: 0.7 10*3/uL (ref 0.1–1.0)
Monocytes Relative: 9 %
Neutro Abs: 5 10*3/uL (ref 1.7–7.7)
Neutrophils Relative %: 70 %
Platelet Count: 115 10*3/uL — ABNORMAL LOW (ref 150–400)
RBC: 3.18 MIL/uL — ABNORMAL LOW (ref 3.87–5.11)
RDW: 14.6 % (ref 11.5–15.5)
WBC Count: 7.2 10*3/uL (ref 4.0–10.5)
nRBC: 0 % (ref 0.0–0.2)

## 2021-12-26 LAB — CMP (CANCER CENTER ONLY)
ALT: 10 U/L (ref 0–44)
AST: 14 U/L — ABNORMAL LOW (ref 15–41)
Albumin: 4.5 g/dL (ref 3.5–5.0)
Alkaline Phosphatase: 43 U/L (ref 38–126)
Anion gap: 7 (ref 5–15)
BUN: 38 mg/dL — ABNORMAL HIGH (ref 8–23)
CO2: 33 mmol/L — ABNORMAL HIGH (ref 22–32)
Calcium: 10.1 mg/dL (ref 8.9–10.3)
Chloride: 103 mmol/L (ref 98–111)
Creatinine: 1.71 mg/dL — ABNORMAL HIGH (ref 0.44–1.00)
GFR, Estimated: 30 mL/min — ABNORMAL LOW (ref >60)
Glucose, Bld: 120 mg/dL — ABNORMAL HIGH (ref 70–99)
Potassium: 4.4 mmol/L (ref 3.5–5.1)
Sodium: 143 mmol/L (ref 135–145)
Total Bilirubin: 0.5 mg/dL (ref 0.3–1.2)
Total Protein: 7.3 g/dL (ref 6.5–8.1)

## 2021-12-26 LAB — RETICULOCYTES
Immature Retic Fract: 11.9 % (ref 2.3–15.9)
RBC.: 3.24 MIL/uL — ABNORMAL LOW (ref 3.87–5.11)
Retic Count, Absolute: 46.7 10*3/uL (ref 19.0–186.0)
Retic Ct Pct: 1.4 % (ref 0.4–3.1)

## 2021-12-26 LAB — IRON AND IRON BINDING CAPACITY (CC-WL,HP ONLY)
Iron: 66 ug/dL (ref 28–170)
Saturation Ratios: 26 % (ref 10.4–31.8)
TIBC: 253 ug/dL (ref 250–450)
UIBC: 187 ug/dL (ref 148–442)

## 2021-12-26 LAB — FERRITIN: Ferritin: 536 ng/mL — ABNORMAL HIGH (ref 11–307)

## 2021-12-26 MED ORDER — EPOETIN ALFA-EPBX 40000 UNIT/ML IJ SOLN
40000.0000 [IU] | Freq: Once | INTRAMUSCULAR | Status: AC
  Administered 2021-12-26: 40000 [IU] via SUBCUTANEOUS
  Filled 2021-12-26: qty 1

## 2021-12-26 NOTE — Patient Instructions (Signed)

## 2021-12-26 NOTE — Progress Notes (Signed)
Hematology and Oncology Follow Up Visit  [name] [id number] [birth year] 81 y.o. 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 Has had some left shoulder pain. Worse with movement of the shoulder. No known injury. Has tried Aspercreme which relieves the pain. No chest pain, SOB, jaw pain, dizziness, nausea.  She has not noted any blood loss. No petechiae.   She reports that her chronic SOB is stable.  Currently on 2L supplemental O2.  No fever, chills, n/v, cough, rash, chest pain, palpitations, abdominal pain or change sin bowel or bladder habits.  No swelling, tenderness, numbness or tingling in her extremities.  No falls or syncope to report.  Appetite and hydration are good.   Wt Readings from Last 3 Encounters:  12/26/21 179 lb 1.9 oz (81.2 kg)  11/18/21 184 lb 6.4 oz (83.6 kg)  10/07/21 179 lb (81.2 kg)   ECOG Performance Status: 1 - Symptomatic but completely ambulatory  Medications:  Allergies as of 12/26/2021       Reactions   Montelukast Sodium Palpitations, Other (See Comments)   REACTION: HEART PALPITATIONS, CHEST PAIN.   Sulfa Antibiotics Other (See Comments)   CHEST PAIN   Sulfonamide Derivatives Other (See Comments)   CHEST PAIN   Ace Inhibitors Other (See Comments)   PT. STATED UNKNOWN REACTION   Indomethacin Other (See Comments)   PT. STATED UNKNOWN REACTION        Medication List        Accurate as of December 26, 2021 11:12 AM. If you have any questions, ask your nurse or doctor.          Accu-Chek Aviva Soln USE AS DIRECTED WITH GLUCOMETER   Accu-Chek Guide test strip Generic drug: glucose blood CHECK BLOOD SUGAR ONE TIME WEEKLY   Accu-Chek Guide w/Device  Kit USE AS DIRECTED TO CHECK BLOOD SUGAR   Accu-Chek Softclix Lancets lancets CHECK BLOOD SUGAR ONE TIME WEEKLY   acetaminophen 500 MG tablet Commonly known as: TYLENOL Take 500 mg by mouth 2 (two) times daily.   albuterol 108 (90 Base) MCG/ACT inhaler Commonly known as: VENTOLIN HFA Inhale 2 puffs into the lungs every 4 (four) hours as needed for wheezing or shortness of breath.   allopurinol 100 MG tablet Commonly known as: ZYLOPRIM TAKE 2 TABLETS ONE TIME DAILY   b complex vitamins tablet Take 1 tablet by mouth daily.   bacitracin ointment Apply 1 application topically 2 (two) times daily.   blood glucose meter kit and supplies Check blood sugars once weekly.   cyanocobalamin 1000 MCG tablet Commonly known as: VITAMIN B12 Take 1,000 mcg by mouth daily.   docusate sodium 100 MG capsule Commonly known as: COLACE Take 1 capsule (100 mg total) by mouth 2 (two) times daily.   DropSafe Alcohol Prep 70 % Pads USE AS DIRECTED  BEFORE  CHECKING BLOOD SUGAR   fluticasone 50 MCG/ACT nasal spray Commonly known as: FLONASE Place 2 sprays into both nostrils daily.   furosemide 40 MG tablet Commonly known as: LASIX TAKE 1 TABLET DAILY AND A SECOND TABLET DAILY AS NEEDED FOR INCREASED EDEMA OR WEIGHT GAIN GREATER THAN 3 POUNDS IN 24 HOURS   ipratropium-albuterol 0.5-2.5 (  3) MG/3ML Soln Commonly known as: DUONEB Take 3 mLs by nebulization every 4 (four) hours as needed.   loratadine 10 MG tablet Commonly known as: CLARITIN Take 1 tablet (10 mg total) by mouth at bedtime. What changed:  when to take this reasons to take this   metoprolol succinate 50 MG 24 hr tablet Commonly known as: TOPROL-XL Take 1 tablet (50 mg total) by mouth daily. Take with or immediately following a meal.   mupirocin ointment 2 % Commonly known as: BACTROBAN Apply 1 application topically 2 (two) times daily.   OXYGEN Inhale 2 L into the lungs continuous.   pantoprazole 40 MG  tablet Commonly known as: PROTONIX Take 1 tablet (40 mg total) by mouth daily.   polyethylene glycol 17 g packet Commonly known as: MIRALAX / GLYCOLAX Take 17 g by mouth daily.   pravastatin 40 MG tablet Commonly known as: PRAVACHOL TAKE 1 TABLET EVERY DAY   PROBIOTIC PO Take 1 tablet by mouth daily.   Symbicort 160-4.5 MCG/ACT inhaler Generic drug: budesonide-formoterol INHALE 2 PUFFS TWICE DAILY IN THE MORNING  AND AT BEDTIME   traMADol 50 MG tablet Commonly known as: ULTRAM TAKE 1 TABLET BY MOUTH EVERY 6 HOURS AS NEEDED   trolamine salicylate 10 % cream Commonly known as: ASPERCREME Apply 1 application topically as needed for muscle pain.        Allergies:  Allergies  Allergen Reactions   Montelukast Sodium Palpitations and Other (See Comments)    REACTION: HEART PALPITATIONS, CHEST PAIN.   Sulfa Antibiotics Other (See Comments)    CHEST PAIN   Sulfonamide Derivatives Other (See Comments)    CHEST PAIN   Ace Inhibitors Other (See Comments)    PT. STATED UNKNOWN REACTION   Indomethacin Other (See Comments)    PT. STATED UNKNOWN REACTION    Past Medical History, Surgical history, Social history, and Family History were reviewed and updated.  Review of Systems: All other 10 point review of systems is negative.   Physical Exam:  weight is 179 lb 1.9 oz (81.2 kg). Her oral temperature is 98.9 F (37.2 C). Her blood pressure is 123/67 and her pulse is 72. Her oxygen saturation is 95%.   Wt Readings from Last 3 Encounters:  12/26/21 179 lb 1.9 oz (81.2 kg)  11/18/21 184 lb 6.4 oz (83.6 kg)  10/07/21 179 lb (81.2 kg)    Ocular: Sclerae unicteric, pupils equal, round and reactive to light Ear-nose-throat: Oropharynx clear, dentition fair Lymphatic: No cervical or supraclavicular adenopathy Lungs no rales or rhonchi, good excursion bilaterally Heart regular rate and rhythm, no murmur appreciated Abd soft, nontender, positive bowel sounds MSK no focal spinal  tenderness, no joint edema Neuro: non-focal, well-oriented, appropriate affect Breasts: Deferred   Lab Results  Component Value Date   WBC 7.2 12/26/2021   HGB 10.1 (L) 12/26/2021   HCT 32.2 (L) 12/26/2021   MCV 101.3 (H) 12/26/2021   PLT 115 (L) 12/26/2021   Lab Results  Component Value Date   FERRITIN 397 (H) 09/24/2021   IRON 128 09/24/2021   TIBC 286 09/24/2021   UIBC 133 (L) 09/05/2021   IRONPCTSAT 45 09/24/2021   Lab Results  Component Value Date   RETICCTPCT 1.4 12/26/2021   RBC 3.18 (L) 12/26/2021   RBC 3.24 (L) 12/26/2021   No results found for: "KPAFRELGTCHN", "LAMBDASER", "KAPLAMBRATIO" No results found for: "IGGSERUM", "IGA", "IGMSERUM" No results found for: "TOTALPROTELP", "ALBUMINELP", "A1GS", "A2GS", "BETS", "BETA2SER", "GAMS", "MSPIKE", "SPEI"   Chemistry  Component Value Date/Time   NA 143 12/26/2021 0954   NA 143 01/27/2020 1535   K 4.4 12/26/2021 0954   CL 103 12/26/2021 0954   CO2 33 (H) 12/26/2021 0954   BUN 38 (H) 12/26/2021 0954   BUN 40 (H) 01/27/2020 1535   CREATININE 1.71 (H) 12/26/2021 0954   CREATININE 1.36 (H) 08/23/2013 1134      Component Value Date/Time   CALCIUM 10.1 12/26/2021 0954   ALKPHOS 43 12/26/2021 0954   AST 14 (L) 12/26/2021 0954   ALT 10 12/26/2021 0954   BILITOT 0.5 12/26/2021 0954      Encounter Diagnoses  Name Primary?   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 Anemia is chronic in nature.  ESA to be given today for Hgb 10.1  Iron studies are pending but have been elevated  Lab check every 3 weeks, follow-up in 3 months.    Hughie Closs, PA-C 12/7/202311:12 AM

## 2022-01-02 ENCOUNTER — Ambulatory Visit: Payer: Medicare HMO | Admitting: Family Medicine

## 2022-01-03 ENCOUNTER — Ambulatory Visit (INDEPENDENT_AMBULATORY_CARE_PROVIDER_SITE_OTHER): Payer: Medicare HMO | Admitting: Family

## 2022-01-03 ENCOUNTER — Telehealth: Payer: Self-pay | Admitting: Family

## 2022-01-03 VITALS — BP 114/57 | HR 79 | Temp 97.9°F | Resp 18 | Ht 65.0 in | Wt 177.6 lb

## 2022-01-03 DIAGNOSIS — J4530 Mild persistent asthma, uncomplicated: Secondary | ICD-10-CM | POA: Diagnosis not present

## 2022-01-03 DIAGNOSIS — N184 Chronic kidney disease, stage 4 (severe): Secondary | ICD-10-CM

## 2022-01-03 DIAGNOSIS — F4323 Adjustment disorder with mixed anxiety and depressed mood: Secondary | ICD-10-CM

## 2022-01-03 DIAGNOSIS — J3489 Other specified disorders of nose and nasal sinuses: Secondary | ICD-10-CM | POA: Insufficient documentation

## 2022-01-03 DIAGNOSIS — R7989 Other specified abnormal findings of blood chemistry: Secondary | ICD-10-CM | POA: Diagnosis not present

## 2022-01-03 DIAGNOSIS — I5042 Chronic combined systolic (congestive) and diastolic (congestive) heart failure: Secondary | ICD-10-CM

## 2022-01-03 DIAGNOSIS — I10 Essential (primary) hypertension: Secondary | ICD-10-CM | POA: Diagnosis not present

## 2022-01-03 DIAGNOSIS — J9611 Chronic respiratory failure with hypoxia: Secondary | ICD-10-CM

## 2022-01-03 DIAGNOSIS — D631 Anemia in chronic kidney disease: Secondary | ICD-10-CM

## 2022-01-03 DIAGNOSIS — E1122 Type 2 diabetes mellitus with diabetic chronic kidney disease: Secondary | ICD-10-CM

## 2022-01-03 DIAGNOSIS — J9612 Chronic respiratory failure with hypercapnia: Secondary | ICD-10-CM

## 2022-01-03 DIAGNOSIS — J4540 Moderate persistent asthma, uncomplicated: Secondary | ICD-10-CM

## 2022-01-03 LAB — COMPREHENSIVE METABOLIC PANEL
ALT: 7 U/L (ref 0–35)
AST: 12 U/L (ref 0–37)
Albumin: 4.2 g/dL (ref 3.5–5.2)
Alkaline Phosphatase: 45 U/L (ref 39–117)
BUN: 33 mg/dL — ABNORMAL HIGH (ref 6–23)
CO2: 35 mEq/L — ABNORMAL HIGH (ref 19–32)
Calcium: 9.6 mg/dL (ref 8.4–10.5)
Chloride: 97 mEq/L (ref 96–112)
Creatinine, Ser: 1.75 mg/dL — ABNORMAL HIGH (ref 0.40–1.20)
GFR: 27.14 mL/min — ABNORMAL LOW (ref >60.00)
Glucose, Bld: 92 mg/dL (ref 70–99)
Potassium: 4.1 mEq/L (ref 3.5–5.1)
Sodium: 142 mEq/L (ref 135–145)
Total Bilirubin: 0.8 mg/dL (ref 0.2–1.2)
Total Protein: 6.5 g/dL (ref 6.0–8.3)

## 2022-01-03 LAB — MICROALBUMIN / CREATININE URINE RATIO
Creatinine,U: 43.7 mg/dL
Microalb Creat Ratio: 1.6 mg/g (ref 0.0–30.0)
Microalb, Ur: <0.7 mg/dL (ref 0.0–1.9)

## 2022-01-03 LAB — HEMOGLOBIN A1C: Hgb A1c MFr Bld: 5.4 % (ref 4.6–6.5)

## 2022-01-03 MED ORDER — MUPIROCIN 2 % EX OINT: 1.0000 | TOPICAL_OINTMENT | Freq: Two times a day (BID) | CUTANEOUS | 0 refills | Status: DC

## 2022-01-03 NOTE — Assessment & Plan Note (Deleted)
Stable on symbicort and prn albuterol. Management per pulmonology.

## 2022-01-03 NOTE — Assessment & Plan Note (Signed)
Stable on furosemide.  Wt Readings from Last 3 Encounters:  01/03/22 177 lb 9.6 oz (80.6 kg)  12/26/21 179 lb 1.9 oz (81.2 kg)  11/18/21 184 lb 6.4 oz (83.6 kg)   She continues to follow with Dr. Radford Pax.

## 2022-01-03 NOTE — Assessment & Plan Note (Signed)
Followed by renal (Dr. Arty Baumgartner).

## 2022-01-03 NOTE — Assessment & Plan Note (Signed)
BP Readings from Last 3 Encounters:  01/03/22 (!) 114/57  12/26/21 123/67  11/18/21 126/76   Bp stable, continue metoprolol xl '50mg'$ .

## 2022-01-03 NOTE — Assessment & Plan Note (Addendum)
Lab Results  Component Value Date   WBC 7.2 12/26/2021   HGB 10.1 (L) 12/26/2021   HCT 32.2 (L) 12/26/2021   MCV 101.3 (H) 12/26/2021   PLT 115 (L) 12/26/2021  Receives Epogen injections per nephrology.

## 2022-01-03 NOTE — Assessment & Plan Note (Addendum)
Lab Results  Component Value Date   HGBA1C 5.5 09/24/2021   HGBA1C 5.4 06/10/2021   HGBA1C 5.5 11/12/2020   Lab Results  Component Value Date   MICROALBUR <0.7 02/23/2020   LDLCALC 53 11/12/2020   CREATININE 1.71 (H) 12/26/2021   Diet controlled.

## 2022-01-03 NOTE — Assessment & Plan Note (Signed)
Lab Results  Component Value Date   TSH 3.55 09/24/2021   Last tsh was normal. Not on medication.

## 2022-01-03 NOTE — Assessment & Plan Note (Addendum)
Stable on symbicort and prn albuterol. Management per pulmonology.

## 2022-01-03 NOTE — Assessment & Plan Note (Signed)
Maintained on oxygen 2 L via Nasal Cannula. Followed by pulmonology- Dr. Melvyn Novas.  Oxygen is 95% today at rest.

## 2022-01-03 NOTE — Telephone Encounter (Signed)
Please request DM eye exam from [facility].

## 2022-01-03 NOTE — Progress Notes (Signed)
Subjective:   By signing my name below, I, Carylon Perches, attest that this documentation has been prepared under the direction and in the presence of Karie Chimera, NP 01/03/2022   Patient ID: [name], female    DOB: 1940/11/18, 81 y.o.   MRN: [id number]  Chief Complaint  Patient presents with   3 month follow up    Concerns/ questions: 1. discuss her heart. 2. Sore in the nose DM eye exam: June- [place] exam and Urine MA due    HPI Patient is in today for an office visit  Sore in the nose: She complains of a sore in her nose. She states that symptoms appeared once the weather changed.  Heart Murmur: She had an echocardiogram completed on 10/02/2021. There was found to have some stable mitral valve regurgitation noted.   Thyroid: Her thyroid levels are normal.  Lab Results  Component Value Date   TSH 3.55 09/24/2021   Mood: She states that she is handling her symptoms well. She also states that her niece recently passed on Nov 24, 2021.   Asthma: She reports that her breathing is stable. She receives 1L of oxygen when she is sitting and 2L when she is standing.   Leg Swelling: She reports that her leg swelling is fine. She notes that symptoms are the worse during the mornings. She is currently taking 40 mg of Lasix.   Cardiology: She is regularly being seen by Dr.Turner  Blood Sugar: Her sugars are well controlled.  Lab Results  Component Value Date   HGBA1C 5.5 09/24/2021   Allergies: She reports that her allergies have been bothering her. She is currently taking OTC Claritin for her symptoms.   Blood Count/Infusion: She is currently receiving Retacrit Injections. She notes of some shoulder pain.   Allergy: She reports that she received antibiotics for a possible kidney infection. She had side effects as a result such as joint pain.   Vision: She states that her macular degeneration has worsen. She reports that her last vision exam was in  06/2021.   Health Maintenance Due  Topic Date Due   Zoster Vaccines- Shingrix (1 of 2) Never done   COLONOSCOPY (Pts 45-32yr Insurance coverage will need to be confirmed)  11/10/2018   OPHTHALMOLOGY EXAM  07/28/2019   Diabetic kidney evaluation - Urine ACR  02/22/2021    Past Medical History:  Diagnosis Date   Abnormal glucose tolerance test    Abnormal TSH 09/22/2016   ACE-inhibitor cough    Anemia 03/12/2014   Arthritis    Asthma    PFT 02/06/09 FEV1 1.41 (65%), FVC 1.92 (64), FEV1% 74, TLC 3.47 (71%), DLCO 48%, +BD   Atypical chest pain    s/p cath, Normal coronaries, Non ST elevation myocardial infarction, Rt groin pseudoaneurysm   Bacterial vaginosis 03/12/2014   Cellulitis 06/22/2016   Chronic kidney disease (CKD), stage III (moderate) ([place] 08/04/2016   COPD (chronic obstructive pulmonary disease) (HCC)    Depression    Diastolic heart failure ([place] 04/07/2016   DVT (deep venous thrombosis) [place]   GERD (gastroesophageal reflux disease)    Gout    Hypercalcemia 10/15/2014   Hyperlipidemia    Hypertension    Hypoxia 10/15/2014   Macular degeneration 04/10/2015   Osteopenia 12/29/2006   Qualifier: Diagnosis of  By: YWynona Luna   Pneumonia    [place]algia [place]umatic[place] 01/07/2016   Renal insufficiency 09/22/2016   Unspecified constipation 06/05/2013  Vitamin D deficiency 01/01/2015    Past Surgical History:  Procedure Laterality Date   APPENDECTOMY  1951   TONSILLECTOMY  1950   TUBAL LIGATION  1968    Family History  Problem Relation Age of Onset   Asthma Sister    Hypertension Sister    Hyperlipidemia Sister    Uterine cancer Sister    Coronary artery disease Brother        x2   Arthritis Brother    Lung cancer Brother        smoker   Hypertension Sister    Arthritis Sister    Hyperlipidemia Sister    Emphysema Sister    COPD Sister        smoker   Heart disease Sister    Hyperlipidemia Sister    Hypertension Sister    Arthritis Sister     Emphysema Brother    Heart disease Brother         smoker   Heart attack Brother    Mental illness Father    Suicidality Father    Heart disease Mother    Hyperlipidemia Mother    Heart attack Mother    Epilepsy Daughter    Hypertension Daughter    Obesity Daughter    COPD Brother        smoker   Lung cancer Brother    Coronary artery disease Other    Colon polyps Sister     Social History   Socioeconomic History   Marital status: Widowed    Spouse name: Not on file   Number of children: 1   Years of education: Not on file   Highest education level: Not on file  Occupational History   Occupation: Retired    Fish farm manager: [employer]  Tobacco Use   Smoking status: Never    Passive exposure: Past   Smokeless tobacco: Never  Vaping Use   Vaping Use: Never used  Substance and Sexual Activity   Alcohol use: No   Drug use: No   Sexual activity: Never    Comment: lives alone, no dietary restrictions  Other Topics Concern   Not on file  Social History Narrative   Not on file   Social Determinants of Health   Financial Resource Strain: Low Risk  (07/25/2020)   Overall Financial Resource Strain (CARDIA)    Difficulty of Paying Living Expenses: Not hard at all  Food Insecurity: No Food Insecurity (10/07/2021)   Hunger Vital Sign    Worried About Running Out of Food in the Last Year: Never true    Reader in the Last Year: Never true  Transportation Needs: No Transportation Needs (10/07/2021)   PRAPARE - Hydrologist (Medical): No    Lack of Transportation (Non-Medical): No  Physical Activity: Sufficiently Active (10/07/2021)   Exercise Vital Sign    Days of Exercise per Week: 7 days    Minutes of Exercise per Session: 30 min  Stress: No Stress Concern Present (10/07/2021)   [place]    Feeling of Stress : Not at all  Social Connections: Moderately Isolated (10/07/2021)    Social Connection and Isolation Panel [NHANES]    Frequency of Communication with Friends and Family: More than three times a week    Frequency of Social Gatherings with Friends and Family: Twice a week    Attends Religious Services: 1 to 4 times per year    Active  Member of Clubs or Organizations: No    Attends Archivist Meetings: Never    Marital Status: Widowed  Intimate Partner Violence: Not At Risk (07/25/2020)   Humiliation, Afraid, Rape, and Kick questionnaire    Fear of Current or Ex-Partner: No    Emotionally Abused: No    Physically Abused: No    Sexually Abused: No    Outpatient Medications Prior to Visit  Medication Sig Dispense Refill   ACCU-CHEK GUIDE test strip CHECK BLOOD SUGAR ONE TIME WEEKLY 100 strip 1   Accu-Chek Softclix Lancets lancets CHECK BLOOD SUGAR ONE TIME WEEKLY 100 each 1   acetaminophen (TYLENOL) 500 MG tablet Take 500 mg by mouth 2 (two) times daily.      albuterol (VENTOLIN HFA) 108 (90 Base) MCG/ACT inhaler Inhale 2 puffs into the lungs every 4 (four) hours as needed for wheezing or shortness of breath. 18 g 3   Alcohol Swabs (DROPSAFE ALCOHOL PREP) 70 % PADS USE AS DIRECTED  BEFORE  CHECKING BLOOD SUGAR 100 each 10   allopurinol (ZYLOPRIM) 100 MG tablet TAKE 2 TABLETS ONE TIME DAILY 180 tablet 1   b complex vitamins tablet Take 1 tablet by mouth daily.     bacitracin ointment Apply 1 application topically 2 (two) times daily. 120 g 0   Blood Glucose Calibration (ACCU-CHEK AVIVA) SOLN USE AS DIRECTED WITH GLUCOMETER 1 each 0   blood glucose meter kit and supplies Check blood sugars once weekly. 1 each 0   Blood Glucose Monitoring Suppl (ACCU-CHEK GUIDE) w/Device KIT USE AS DIRECTED TO CHECK BLOOD SUGAR 1 kit 1   docusate sodium (COLACE) 100 MG capsule Take 1 capsule (100 mg total) by mouth 2 (two) times daily. 10 capsule 0   fluticasone (FLONASE) 50 MCG/ACT nasal spray Place 2 sprays into both nostrils daily. 48 g 1   furosemide (LASIX) 40 MG  tablet TAKE 1 TABLET DAILY AND A SECOND TABLET DAILY AS NEEDED FOR INCREASED EDEMA OR WEIGHT GAIN GREATER THAN 3 POUNDS IN 24 HOURS 100 tablet 5   ipratropium-albuterol (DUONEB) 0.5-2.5 (3) MG/3ML SOLN Take 3 mLs by nebulization every 4 (four) hours as needed. 360 mL 11   loratadine (CLARITIN) 10 MG tablet Take 1 tablet (10 mg total) by mouth at bedtime. (Patient taking differently: Take 10 mg by mouth at bedtime as needed for allergies.) 30 tablet 5   metoprolol succinate (TOPROL-XL) 50 MG 24 hr tablet Take 1 tablet (50 mg total) by mouth daily. Take with or immediately following a meal. 90 tablet 3   OXYGEN Inhale 2 L into the lungs continuous.      pantoprazole (PROTONIX) 40 MG tablet Take 1 tablet (40 mg total) by mouth daily. 90 tablet 1   polyethylene glycol (MIRALAX / GLYCOLAX) 17 g packet Take 17 g by mouth daily. 14 each 0   pravastatin (PRAVACHOL) 40 MG tablet TAKE 1 TABLET EVERY DAY 90 tablet 1   Probiotic Product (PROBIOTIC PO) Take 1 tablet by mouth daily.      SYMBICORT 160-4.5 MCG/ACT inhaler INHALE 2 PUFFS TWICE DAILY IN THE MORNING  AND AT BEDTIME 3 each 1   traMADol (ULTRAM) 50 MG tablet TAKE 1 TABLET BY MOUTH EVERY 6 HOURS AS NEEDED 90 tablet 0   trolamine salicylate (ASPERCREME) 10 % cream Apply 1 application topically as needed for muscle pain.     vitamin B-12 (CYANOCOBALAMIN) 1000 MCG tablet Take 1,000 mcg by mouth daily.      mupirocin ointment (BACTROBAN)  2 % Apply 1 application topically 2 (two) times daily. 30 g 0   No facility-administered medications prior to visit.    Allergies  Allergen Reactions   Montelukast Sodium Palpitations and Other (See Comments)    REACTION: HEART PALPITATIONS, CHEST PAIN.   Sulfa Antibiotics Other (See Comments)    CHEST PAIN   Sulfonamide Derivatives Other (See Comments)    CHEST PAIN   Ace Inhibitors Other (See Comments)    PT. STATED UNKNOWN REACTION   Indomethacin Other (See Comments)    PT. STATED UNKNOWN REACTION     Review of Systems  HENT:         (+) Sore in Nose       Objective:    Physical Exam Constitutional:      General: She is not in acute distress.    Appearance: Normal appearance. She is not ill-appearing.  HENT:     Head: Normocephalic and atraumatic.     Comments: Dry nasal mucosa, no discrete sore noted    Right Ear: External ear normal.     Left Ear: External ear normal.  Eyes:     Extraocular Movements: Extraocular movements intact.     Pupils: Pupils are equal, round, and reactive to light.  Cardiovascular:     Rate and Rhythm: Normal rate and regular rhythm.     Pulses:          Dorsalis pedis pulses are 2+ on the right side and 2+ on the left side.       Posterior tibial pulses are 2+ on the right side and 2+ on the left side.     Heart sounds: Murmur heard.     Systolic murmur is present with a grade of 1/6.     No gallop.  Pulmonary:     Effort: Pulmonary effort is normal. No respiratory distress.     Breath sounds: Wheezing (Soft, Inspiratory) present. No rales.  Musculoskeletal:     Right lower leg: 3+ Edema present.     Left lower leg: 3+ Edema present.  Feet:     Comments: Hammer toe, left second toe Skin:    General: Skin is warm and dry.  Neurological:     Mental Status: She is alert and oriented to person, place, and time.  Psychiatric:        Mood and Affect: Mood normal.        Behavior: Behavior normal.        Judgment: Judgment normal.    BP (!) 114/57 (BP Location: Left Arm, Patient Position: Sitting, Cuff Size: Normal)   Pulse 79   Temp 97.9 F (36.6 C) (Oral)   Resp 18   Ht _0  (1.651 m)   Wt 177 lb 9.6 oz (80.6 kg)   SpO2 95%   BMI 29.55 kg/m  Wt Readings from Last 3 Encounters:  01/03/22 177 lb 9.6 oz (80.6 kg)  12/26/21 179 lb 1.9 oz (81.2 kg)  11/18/21 184 lb 6.4 oz (83.6 kg)   Diabetic Foot Exam - Simple   Simple Foot Form Diabetic Foot exam was performed with the following findings: Yes 01/03/2022 11:29 AM  Visual  Inspection See comments: Yes Sensation Testing Intact to touch and monofilament testing bilaterally: Yes Pulse Check Posterior Tibialis and Dorsalis pulse intact bilaterally: Yes Comments Hammer toe- left second toe        Assessment & Plan:   Problem List Items Addressed This Visit       Unprioritized  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HCC) (Chronic)    Followed by renal (Dr. Arty Baumgartner).       Sore in nose - Primary    Rx sent for mupuricin ointment.      Essential hypertension    BP Readings from Last 3 Encounters:  01/03/22 (!) 114/57  12/26/21 123/67  11/18/21 126/76  Bp stable, continue metoprolol xl 41m.       Diabetes type 2, controlled ([place]    Lab Results  Component Value Date   HGBA1C 5.5 09/24/2021   HGBA1C 5.4 06/10/2021   HGBA1C 5.5 11/12/2020   Lab Results  Component Value Date   MICROALBUR <0.7 02/23/2020   LDLCALC 53 11/12/2020   CREATININE 1.71 (H) 12/26/2021  Diet controlled.       Relevant Orders   HgB A1c   Comp Met (CMET)   Urine Microalbumin w/creat. ratio   Chronic respiratory failure with hypoxia and hypercapnia (HCC)    Maintained on oxygen 2 L via Nasal Cannula. Followed by pulmonology- Dr. WMelvyn Novas  Oxygen is 95% today at rest.      Chronic combined systolic and diastolic CHF (congestive heart failure) (HCC)    Stable on furosemide.  Wt Readings from Last 3 Encounters:  01/03/22 177 lb 9.6 oz (80.6 kg)  12/26/21 179 lb 1.9 oz (81.2 kg)  11/18/21 184 lb 6.4 oz (83.6 kg)  She continues to follow with Dr. TRadford Pax        Chronic asthma, mild persistent, uncomplicated    Stable on symbicort and prn albuterol. Management per pulmonology.       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       ADJ DISORDER WITH MIXED ANXIETY & DEPRESSED MOOD    Reports that she is doing well, Not currently on medication.       Abnormal TSH    Lab Results  Component Value Date   TSH 3.55 09/24/2021  Last tsh was normal. Not on medication.       Meds ordered this encounter  Medications   mupirocin ointment (BACTROBAN) 2 %    Sig: Apply 1 Application topically 2 (two) times daily.    Dispense:  30 g    Refill:  0    Order Specific Question:   Supervising Provider    Answer:   BPenni HomansA [[id number]]    I, MNance Pear NP, personally preformed the services described in this documentation.  All medical record entries made by the scribe were at my direction and in my presence.  I have reviewed the chart and discharge instructions (if applicable) and agree that the record reflects my personal performance and is accurate and complete. 01/03/2022   I,Amber Collins,acting as a scribe for MNance Pear NP.,have documented all relevant documentation on the behalf of MNance Pear NP,as directed by  MNance Pear NP while in the presence of MNance Pear NP.    MNance Pear NP

## 2022-01-03 NOTE — Telephone Encounter (Signed)
Record release faxed 

## 2022-01-03 NOTE — Assessment & Plan Note (Signed)
Rx sent for mupuricin ointment.

## 2022-01-03 NOTE — Assessment & Plan Note (Signed)
Reports that she is doing well, Not currently on medication.

## 2022-01-17 ENCOUNTER — Other Ambulatory Visit: Payer: Self-pay | Admitting: Family Medicine

## 2022-01-22 DIAGNOSIS — R519 Headache, unspecified: Secondary | ICD-10-CM | POA: Diagnosis not present

## 2022-01-22 DIAGNOSIS — J324 Chronic pansinusitis: Secondary | ICD-10-CM | POA: Diagnosis not present

## 2022-01-22 DIAGNOSIS — R5383 Other fatigue: Secondary | ICD-10-CM | POA: Diagnosis not present

## 2022-01-23 ENCOUNTER — Inpatient Hospital Stay: Payer: Medicare HMO

## 2022-01-28 ENCOUNTER — Inpatient Hospital Stay: Payer: Medicare HMO

## 2022-01-29 ENCOUNTER — Other Ambulatory Visit: Payer: Self-pay | Admitting: Family

## 2022-01-29 ENCOUNTER — Inpatient Hospital Stay: Payer: Medicare HMO

## 2022-01-29 ENCOUNTER — Inpatient Hospital Stay: Payer: Medicare HMO | Attending: Family Medicine

## 2022-01-29 VITALS — BP 124/60 | HR 78 | Temp 98.0°F | Resp 18

## 2022-01-29 DIAGNOSIS — N183 Chronic kidney disease, stage 3 unspecified: Secondary | ICD-10-CM | POA: Diagnosis not present

## 2022-01-29 DIAGNOSIS — Z79899 Other long term (current) drug therapy: Secondary | ICD-10-CM | POA: Insufficient documentation

## 2022-01-29 DIAGNOSIS — D509 Iron deficiency anemia, unspecified: Secondary | ICD-10-CM

## 2022-01-29 DIAGNOSIS — D631 Anemia in chronic kidney disease: Secondary | ICD-10-CM | POA: Diagnosis not present

## 2022-01-29 LAB — CBC WITH DIFFERENTIAL (CANCER CENTER ONLY)
Abs Immature Granulocytes: 0.69 10*3/uL — ABNORMAL HIGH (ref 0.00–0.07)
Basophils Absolute: 0.1 10*3/uL (ref 0.0–0.1)
Basophils Relative: 0 %
Eosinophils Absolute: 0.2 10*3/uL (ref 0.0–0.5)
Eosinophils Relative: 2 %
HCT: 32.7 % — ABNORMAL LOW (ref 36.0–46.0)
Hemoglobin: 9.9 g/dL — ABNORMAL LOW (ref 12.0–15.0)
Immature Granulocytes: 6 %
Lymphocytes Relative: 11 %
Lymphs Abs: 1.4 10*3/uL (ref 0.7–4.0)
MCH: 31.7 pg (ref 26.0–34.0)
MCHC: 30.3 g/dL (ref 30.0–36.0)
MCV: 104.8 fL — ABNORMAL HIGH (ref 80.0–100.0)
Monocytes Absolute: 0.9 10*3/uL (ref 0.1–1.0)
Monocytes Relative: 7 %
Neutro Abs: 8.9 10*3/uL — ABNORMAL HIGH (ref 1.7–7.7)
Neutrophils Relative %: 74 %
Platelet Count: 151 10*3/uL (ref 150–400)
RBC: 3.12 MIL/uL — ABNORMAL LOW (ref 3.87–5.11)
RDW: 14.3 % (ref 11.5–15.5)
WBC Count: 12.2 10*3/uL — ABNORMAL HIGH (ref 4.0–10.5)
nRBC: 0 % (ref 0.0–0.2)

## 2022-01-29 LAB — FERRITIN: Ferritin: 563 ng/mL — ABNORMAL HIGH (ref 11–307)

## 2022-01-29 MED ORDER — EPOETIN ALFA-EPBX 40000 UNIT/ML IJ SOLN
40000.0000 [IU] | Freq: Once | INTRAMUSCULAR | Status: AC
  Administered 2022-01-29: 40000 [IU] via SUBCUTANEOUS
  Filled 2022-01-29: qty 1

## 2022-01-29 NOTE — Patient Instructions (Signed)

## 2022-01-30 LAB — IRON AND IRON BINDING CAPACITY (CC-WL,HP ONLY)
Iron: 97 ug/dL (ref 28–170)
Saturation Ratios: 44 % — ABNORMAL HIGH (ref 10.4–31.8)
TIBC: 218 ug/dL — ABNORMAL LOW (ref 250–450)
UIBC: 121 ug/dL — ABNORMAL LOW (ref 148–442)

## 2022-01-31 ENCOUNTER — Other Ambulatory Visit: Payer: Self-pay | Admitting: Family Medicine

## 2022-01-31 ENCOUNTER — Inpatient Hospital Stay: Payer: Medicare HMO

## 2022-01-31 DIAGNOSIS — M549 Dorsalgia, unspecified: Secondary | ICD-10-CM

## 2022-01-31 NOTE — Telephone Encounter (Signed)
Requesting: tramadol '50mg'$   Contract:09/24/21 UDS: 09/24/21 Last Visit: 01/03/22 w/ Lenna Sciara Next Visit: 04/07/22 Last Refill: 12/16/21 #90 and 0RF   Please Advise

## 2022-02-03 ENCOUNTER — Other Ambulatory Visit: Payer: Self-pay | Admitting: Family

## 2022-02-05 ENCOUNTER — Telehealth: Payer: Self-pay | Admitting: *Deleted

## 2022-02-05 ENCOUNTER — Other Ambulatory Visit (HOSPITAL_COMMUNITY): Payer: Self-pay

## 2022-02-05 ENCOUNTER — Other Ambulatory Visit: Payer: Self-pay

## 2022-02-05 MED ORDER — DENOSUMAB 60 MG/ML ~~LOC~~ SOSY
PREFILLED_SYRINGE | SUBCUTANEOUS | 0 refills | Status: DC
  Filled 2022-02-05 (×3): qty 1, 180d supply, fill #0

## 2022-02-05 NOTE — Telephone Encounter (Signed)
Pt scheduled for 02/12/22.  Rx sent to [facility] : [facility].

## 2022-02-05 NOTE — Telephone Encounter (Signed)
Left message on machine to call back to schedule prolia appointment she is due anytime (Pt ready for scheduling on or after 01/09/22 ).  Will need to send Prolia to [facility] (cheaper for pt).

## 2022-02-05 NOTE — Telephone Encounter (Signed)
Pt scheduled for 02/12/22.  Rx sent to [facility].

## 2022-02-06 ENCOUNTER — Other Ambulatory Visit (HOSPITAL_COMMUNITY): Payer: Self-pay

## 2022-02-12 ENCOUNTER — Other Ambulatory Visit (HOSPITAL_COMMUNITY): Payer: Self-pay

## 2022-02-12 ENCOUNTER — Ambulatory Visit (INDEPENDENT_AMBULATORY_CARE_PROVIDER_SITE_OTHER): Payer: Medicare HMO

## 2022-02-12 DIAGNOSIS — M810 Age-related osteoporosis without current pathological fracture: Secondary | ICD-10-CM | POA: Diagnosis not present

## 2022-02-12 MED ORDER — DENOSUMAB 60 MG/ML ~~LOC~~ SOSY
60.0000 mg | PREFILLED_SYRINGE | Freq: Once | SUBCUTANEOUS | Status: AC
  Administered 2022-02-12: 60 mg via SUBCUTANEOUS

## 2022-02-12 NOTE — Progress Notes (Signed)
Patient her for Prolia injection per Dr. Charlett Blake.  Injection given in left arm and patient tolerated well.

## 2022-02-13 ENCOUNTER — Telehealth: Payer: Self-pay | Admitting: *Deleted

## 2022-02-13 NOTE — Telephone Encounter (Signed)
Prolia given on 02/12/22.

## 2022-02-24 ENCOUNTER — Ambulatory Visit: Payer: Medicare HMO | Admitting: Internal Medicine

## 2022-02-28 ENCOUNTER — Other Ambulatory Visit: Payer: Self-pay | Admitting: Family

## 2022-02-28 DIAGNOSIS — D631 Anemia in chronic kidney disease: Secondary | ICD-10-CM

## 2022-02-28 DIAGNOSIS — D509 Iron deficiency anemia, unspecified: Secondary | ICD-10-CM

## 2022-03-03 ENCOUNTER — Other Ambulatory Visit: Payer: Self-pay | Admitting: *Deleted

## 2022-03-03 ENCOUNTER — Inpatient Hospital Stay: Payer: Medicare HMO | Attending: Family Medicine

## 2022-03-03 ENCOUNTER — Inpatient Hospital Stay: Payer: Medicare HMO

## 2022-03-03 VITALS — BP 122/59 | HR 82 | Temp 98.3°F | Resp 18

## 2022-03-03 DIAGNOSIS — D631 Anemia in chronic kidney disease: Secondary | ICD-10-CM

## 2022-03-03 DIAGNOSIS — N183 Chronic kidney disease, stage 3 unspecified: Secondary | ICD-10-CM | POA: Insufficient documentation

## 2022-03-03 DIAGNOSIS — Z79899 Other long term (current) drug therapy: Secondary | ICD-10-CM | POA: Insufficient documentation

## 2022-03-03 DIAGNOSIS — D509 Iron deficiency anemia, unspecified: Secondary | ICD-10-CM

## 2022-03-03 LAB — CMP (CANCER CENTER ONLY)
ALT: 6 U/L (ref 0–44)
AST: 11 U/L — ABNORMAL LOW (ref 15–41)
Albumin: 3.9 g/dL (ref 3.5–5.0)
Alkaline Phosphatase: 37 U/L — ABNORMAL LOW (ref 38–126)
Anion gap: 6 (ref 5–15)
BUN: 29 mg/dL — ABNORMAL HIGH (ref 8–23)
CO2: 33 mmol/L — ABNORMAL HIGH (ref 22–32)
Calcium: 9.9 mg/dL (ref 8.9–10.3)
Chloride: 102 mmol/L (ref 98–111)
Creatinine: 1.4 mg/dL — ABNORMAL HIGH (ref 0.44–1.00)
GFR, Estimated: 38 mL/min — ABNORMAL LOW (ref >60)
Glucose, Bld: 98 mg/dL (ref 70–99)
Potassium: 4.6 mmol/L (ref 3.5–5.1)
Sodium: 141 mmol/L (ref 135–145)
Total Bilirubin: 0.4 mg/dL (ref 0.3–1.2)
Total Protein: 6.6 g/dL (ref 6.5–8.1)

## 2022-03-03 LAB — CBC WITH DIFFERENTIAL (CANCER CENTER ONLY)
Abs Immature Granulocytes: 0.02 10*3/uL (ref 0.00–0.07)
Basophils Absolute: 0 10*3/uL (ref 0.0–0.1)
Basophils Relative: 1 %
Eosinophils Absolute: 0.2 10*3/uL (ref 0.0–0.5)
Eosinophils Relative: 4 %
HCT: 32.1 % — ABNORMAL LOW (ref 36.0–46.0)
Hemoglobin: 9.6 g/dL — ABNORMAL LOW (ref 12.0–15.0)
Immature Granulocytes: 0 %
Lymphocytes Relative: 18 %
Lymphs Abs: 1 10*3/uL (ref 0.7–4.0)
MCH: 30.5 pg (ref 26.0–34.0)
MCHC: 29.9 g/dL — ABNORMAL LOW (ref 30.0–36.0)
MCV: 101.9 fL — ABNORMAL HIGH (ref 80.0–100.0)
Monocytes Absolute: 0.7 10*3/uL (ref 0.1–1.0)
Monocytes Relative: 12 %
Neutro Abs: 3.7 10*3/uL (ref 1.7–7.7)
Neutrophils Relative %: 65 %
Platelet Count: 127 10*3/uL — ABNORMAL LOW (ref 150–400)
RBC: 3.15 MIL/uL — ABNORMAL LOW (ref 3.87–5.11)
RDW: 14.7 % (ref 11.5–15.5)
WBC Count: 5.6 10*3/uL (ref 4.0–10.5)
nRBC: 0 % (ref 0.0–0.2)

## 2022-03-03 MED ORDER — EPOETIN ALFA-EPBX 40000 UNIT/ML IJ SOLN
40000.0000 [IU] | Freq: Once | INTRAMUSCULAR | Status: AC
  Administered 2022-03-03: 40000 [IU] via SUBCUTANEOUS
  Filled 2022-03-03: qty 1

## 2022-03-03 NOTE — Patient Instructions (Signed)

## 2022-03-07 ENCOUNTER — Telehealth: Payer: Self-pay | Admitting: Internal Medicine

## 2022-03-07 DIAGNOSIS — J4530 Mild persistent asthma, uncomplicated: Secondary | ICD-10-CM

## 2022-03-10 NOTE — Telephone Encounter (Signed)
Called patient but she did not answer. Left message for her to call us back. I have placed the order for the nebulizer machine since MW has prescribed Duoneb solution for her in the past.

## 2022-03-13 DIAGNOSIS — D631 Anemia in chronic kidney disease: Secondary | ICD-10-CM | POA: Diagnosis not present

## 2022-03-13 DIAGNOSIS — E1122 Type 2 diabetes mellitus with diabetic chronic kidney disease: Secondary | ICD-10-CM | POA: Diagnosis not present

## 2022-03-13 DIAGNOSIS — N184 Chronic kidney disease, stage 4 (severe): Secondary | ICD-10-CM | POA: Diagnosis not present

## 2022-03-13 DIAGNOSIS — I129 Hypertensive chronic kidney disease with stage 1 through stage 4 chronic kidney disease, or unspecified chronic kidney disease: Secondary | ICD-10-CM | POA: Diagnosis not present

## 2022-03-15 ENCOUNTER — Other Ambulatory Visit: Payer: Self-pay | Admitting: Family Medicine

## 2022-03-15 DIAGNOSIS — M549 Dorsalgia, unspecified: Secondary | ICD-10-CM

## 2022-03-17 NOTE — Telephone Encounter (Signed)
Requesting: tramadol '50mg'$   Contract: 09/24/21 UDS: 09/24/21 Last Visit: 01/03/22 w/ Lenna Sciara Next Visit: 04/07/22  Last Refill: 01/31/22 #90 and 0RF   Please Advise /

## 2022-04-02 NOTE — Telephone Encounter (Signed)
ATC LVMTCB x1 sent letter via My Chart per office policy. Closing encounter.

## 2022-04-02 NOTE — Telephone Encounter (Signed)
Re routing to Triage for 2nd attempt to contact pt and sign off on encounter. Thank you!

## 2022-04-03 ENCOUNTER — Inpatient Hospital Stay: Payer: Medicare HMO | Admitting: Medical Oncology

## 2022-04-03 ENCOUNTER — Inpatient Hospital Stay: Payer: Medicare HMO

## 2022-04-04 DIAGNOSIS — S61451A Open bite of right hand, initial encounter: Secondary | ICD-10-CM | POA: Diagnosis not present

## 2022-04-04 DIAGNOSIS — L03113 Cellulitis of right upper limb: Secondary | ICD-10-CM | POA: Diagnosis not present

## 2022-04-06 NOTE — Assessment & Plan Note (Signed)
Hydrate and monitor 

## 2022-04-06 NOTE — Assessment & Plan Note (Signed)
Supplement and monitor 

## 2022-04-06 NOTE — Assessment & Plan Note (Signed)
-   Asymptomatic, will monitor.

## 2022-04-06 NOTE — Assessment & Plan Note (Signed)
Encourage heart healthy diet such as MIND or DASH diet, increase exercise, avoid trans fats, simple carbohydrates and processed foods, consider a krill or fish or flaxseed oil cap daily. Tolerating Pravastatin 

## 2022-04-06 NOTE — Assessment & Plan Note (Signed)
Well controlled, no changes to meds. Encouraged heart healthy diet such as the DASH diet and exercise as tolerated.  °

## 2022-04-06 NOTE — Assessment & Plan Note (Signed)
hgba1c acceptable, minimize simple carbs. Increase exercise as tolerated. Continue current meds 

## 2022-04-07 ENCOUNTER — Ambulatory Visit (INDEPENDENT_AMBULATORY_CARE_PROVIDER_SITE_OTHER): Payer: Medicare HMO | Admitting: Family Medicine

## 2022-04-07 ENCOUNTER — Ambulatory Visit
Admission: EM | Admit: 2022-04-07 | Discharge: 2022-04-07 | Disposition: A | Discharge status: Home / Self Care | Payer: Medicare HMO

## 2022-04-07 VITALS — BP 130/64 | HR 86 | Temp 97.5°F | Resp 16 | Ht 65.0 in | Wt 177.0 lb

## 2022-04-07 DIAGNOSIS — N184 Chronic kidney disease, stage 4 (severe): Secondary | ICD-10-CM | POA: Diagnosis not present

## 2022-04-07 DIAGNOSIS — W5501XA Bitten by cat, initial encounter: Secondary | ICD-10-CM | POA: Diagnosis not present

## 2022-04-07 DIAGNOSIS — W5501XD Bitten by cat, subsequent encounter: Secondary | ICD-10-CM | POA: Diagnosis not present

## 2022-04-07 DIAGNOSIS — S61451A Open bite of right hand, initial encounter: Secondary | ICD-10-CM | POA: Diagnosis not present

## 2022-04-07 DIAGNOSIS — D696 Thrombocytopenia, unspecified: Secondary | ICD-10-CM

## 2022-04-07 DIAGNOSIS — E559 Vitamin D deficiency, unspecified: Secondary | ICD-10-CM | POA: Diagnosis not present

## 2022-04-07 DIAGNOSIS — I10 Essential (primary) hypertension: Secondary | ICD-10-CM | POA: Diagnosis not present

## 2022-04-07 DIAGNOSIS — E1122 Type 2 diabetes mellitus with diabetic chronic kidney disease: Secondary | ICD-10-CM

## 2022-04-07 DIAGNOSIS — M791 Myalgia, unspecified site: Secondary | ICD-10-CM

## 2022-04-07 DIAGNOSIS — E782 Mixed hyperlipidemia: Secondary | ICD-10-CM | POA: Diagnosis not present

## 2022-04-07 DIAGNOSIS — S61451D Open bite of right hand, subsequent encounter: Secondary | ICD-10-CM | POA: Diagnosis not present

## 2022-04-07 MED ORDER — AMOXICILLIN-POT CLAVULANATE 875-125 MG PO TABS: 1.0000 | ORAL_TABLET | Freq: Two times a day (BID) | ORAL | 0 refills | Status: DC

## 2022-04-07 NOTE — ED Triage Notes (Signed)
Patient obtained a cat bite that happened last Thursday afternoon. Patient cleansed with peroxide and applied neosporin. The patient was prescribed doxycycline Friday but was prescribed Augmentin today. The patient states she has stiffness/ soreness to the right pointer finger. There is redness and swelling to the right hand.

## 2022-04-07 NOTE — Discharge Instructions (Signed)
Continue Augmentin as prescribed by your doctor Follow-up with your PCP as needed

## 2022-04-07 NOTE — Patient Instructions (Signed)
Animal Bite, Adult ?Animal bites range from mild to serious. An animal bite can result in any of these injuries: ?A scratch. ?A deep, open cut. ?Broken (punctured) or torn skin. ?A crush injury. ?A bone injury. ?A small bite from a house pet is usually less serious than a bite from a stray or wild animal. Cat bites can be more serious because their long, thin teeth can cause deep puncture wounds that close fast, trapping bacteria inside. ?Stray or wild animals, such as a raccoon, fox, skunk, or bat, are at higher risk of carrying a serious infection called rabies, which they can pass to a human through a bite. A bite from one of these animals needs medical care right away and, sometimes, rabies vaccination. ?What increases the risk? ?You are more likely to be bitten by an animal if: ?You are around unfamiliar pets. ?You disturb an animal when it is eating, sleeping, or caring for its babies. ?You are outdoors in a place where small, wild animals roam freely. ?What are the signs or symptoms? ?Common symptoms of an animal bite include: ?Pain. ?Bleeding. ?Swelling. ?Bruising. ?How is this diagnosed? ?This condition may be diagnosed based on a physical exam and medical history. Your health care provider will examine your wound and ask for details about the animal and how the bite happened. You may also have tests, such as: ?Blood tests to check for infection. ?X-rays to check for damage to bones or joints. ?Taking a fluid sample from your wound and checking it for infection (culture test). ?How is this treated? ?Treatment depends on the type of animal, where the bite is on your body, and your medical history. Treatment may include: ?Wound care. This often includes cleaning the wound and rinsing it out (flushing it) with saline solution, which is made of salt and water. A bandage (dressing) is also often applied. In rare cases, the wound may be closed with stitches (sutures), staples, skin glue, or adhesive  strips. ?Antibiotic medicine to prevent or treat infection. This medicine may be prescribed in pill or ointment form. If the bite area gets infected, the medicine may be given through an IV. ?A tetanus shot to prevent tetanus infection. ?Rabies treatment to prevent rabies infection, if the animal could have rabies. ?Surgery. This may be done if a bite gets infected or causes damage that needs to be repaired. ?Follow these instructions at home: ?Medicines ?Take or apply over-the-counter and prescription medicines only as told by your health care provider. ?If you were prescribed an antibiotic medicine, take or apply it as told by your health care provider. Do not stop using the antibiotic even if you start to feel better. ?Wound care ? ?Follow instructions from your health care provider about how to take care of your wound. Make sure you: ?Wash your hands with soap and water for at least 20 seconds before and after you change your dressing. If soap and water are not available, use hand sanitizer. ?Change your dressing as told by your health care provider. ?Leave sutures, skin glue, or adhesive strips in place. These skin closures may need to stay in place for 2 weeks or longer. If adhesive strip edges start to loosen and curl up, you may trim the loose edges. Do not remove adhesive strips completely unless your health care provider tells you to do that. ?Check your wound every day for signs of infection. Check for: ?More redness, swelling, or pain. ?More fluid or blood. ?Warmth. ?Pus or a bad smell. ?General   instructions ? ?Raise (elevate) the injured area above the level of your heart while you are sitting or lying down, if this is possible. ?If directed, put ice on the injured area. To do this: ?Put ice in a plastic bag. ?Place a towel between your skin and the bag. ?Leave the ice on for 20 minutes, 2-3 times per day. ?Remove the ice if your skin turns bright red. This is very important. If you cannot feel pain,  heat, or cold, you have a greater risk of damage to the area. ?Keep all follow-up visits. This is important. ?Contact a health care provider if: ?You have more redness, swelling, or pain around your wound. ?Your wound feels warm to the touch. ?You have a fever or chills. ?You have a general feeling of sickness (malaise). ?You feel nauseous or you vomit. ?You have pain that does not get better. ?Get help right away if: ?You have a red streak that leads away from your wound. ?You have non-clear fluid or more blood coming from your wound. ?There is pus or a bad smell coming from your wound. ?You have trouble moving your injured area. ?You have numbness or tingling that spreads beyond your wound. ?Summary ?Animal bites can range from mild to serious. An animal bite can cause a scratch on the skin, a deep and open cut, torn or punctured skin, a crush injury, or a bone injury. ?A bite from a stray or wild animal needs medical care right away and, sometimes, rabies vaccination. ?Your health care provider will examine your wound and ask for details about the animal and how the bite happened. ?Treatment may include wound care, antibiotic medicine, a tetanus shot, and rabies treatment if the animal could have rabies. ?This information is not intended to replace advice given to you by your health care provider. Make sure you discuss any questions you have with your health care provider. ?Document Revised: 01/11/2021 Document Reviewed: 01/11/2021 ?Elsevier Patient Education ? 2023 [employer] ? ?

## 2022-04-07 NOTE — ED Provider Notes (Addendum)
[facility]    CSN: [id number] Arrival date & time: 04/07/22  1721      History   Chief Complaint Chief Complaint  Patient presents with   Animal Bite    HPI [name] is a 82 y.o. female presents for evaluation of a cat bite.  Patient reports 5 days ago she was bitten by her neighbors cat on her right hand.  She went to an urgent care and was started on doxycycline.  She saw her PCP today who changed her medication from doxycycline to Augmentin which she has not started.  Patient at that time told her doctor that she did not know if the cat was up-to-date on its rabies vaccine.  Her PCP recommended she come to urgent care to get vaccinated for rabies.  She states her doctor also called the health department regarding this.  Patient is up-to-date on her tetanus vaccine.  Denies any fevers or chills.  States her wounds are feeling better.  No other concerns at this time.   Animal Bite   Past Medical History:  Diagnosis Date   Abnormal glucose tolerance test    Abnormal TSH 09/22/2016   ACE-inhibitor cough    Anemia 03/12/2014   Arthritis    Asthma    PFT 02/06/09 FEV1 1.41 (65%), FVC 1.92 (64), FEV1% 74, TLC 3.47 (71%), DLCO 48%, +BD   Atypical chest pain    s/p cath, Normal coronaries, Non ST elevation myocardial infarction, Rt groin pseudoaneurysm   Bacterial vaginosis 03/12/2014   Cellulitis 06/22/2016   Chronic kidney disease (CKD), stage III (moderate) 08/04/2016   COPD (chronic obstructive pulmonary disease)    Depression    Diastolic heart failure 04/07/2016   DVT (deep venous thrombosis) 1987   GERD (gastroesophageal reflux disease)    Gout    Hypercalcemia 10/15/2014   Hyperlipidemia    Hypertension    Hypoxia 10/15/2014   Macular degeneration 04/10/2015   Osteopenia 12/29/2006   Qualifier: Diagnosis of  By: Nena Jordan    Pneumonia    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   Renal insufficiency 09/22/2016   Unspecified constipation 06/05/2013   Vitamin D  deficiency 01/01/2015    Patient Active Problem List   Diagnosis Date Noted   Cat bite of hand, right, subsequent encounter 04/07/2022   Sore in nose 01/03/2022   Chest pain 09/25/2021   Adjustment disorder 06/10/2021   Right anterior knee pain 02/25/2021   Tremor 02/25/2021   Skin lesion of right ear 11/12/2020   Myalgia 11/12/2020   Edema 07/27/2020   Grief 06/30/2020   IDA (iron deficiency anemia) 05/09/2020   Physical deconditioning 02/26/2020   Chronic asthma, mild persistent, uncomplicated 02/23/2018   High serum vitamin B12 11/20/2017   Subarachnoid hemorrhage 11/05/2017   Intracranial hemorrhage 11/04/2017   Thrombocytopenia 10/16/2017   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   Vitamin D deficiency 06/16/2017   Hypernatremia 12/25/2016   Nonintractable headache 10/26/2016   Leukocytes in urine 10/26/2016   Peripheral arterial disease 10/01/2016   Abnormal TSH 09/22/2016   Stage 4 chronic kidney disease 08/04/2016   Heart murmur 04/25/2016   Chronic combined systolic and diastolic CHF (congestive heart failure) 04/07/2016   Bronchitis 03/06/2016   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   Bilateral lower extremity edema 08/28/2015   Chronic respiratory failure with hypoxia and hypercapnia 06/28/2015   Macular degeneration 04/10/2015   Fall at home, initial encounter 12/04/2014   Hypercalcemia 10/15/2014   Hypoxia 10/15/2014   Anemia associated with  stage 4 chronic renal failure 03/12/2014   Cervical cancer screening 02/28/2014   Eustachian tube dysfunction 01/29/2014   Medicare annual wellness visit, subsequent 12/04/2013   Constipation 06/05/2013   Shortness of breath 03/07/2013   Tachycardia 02/23/2013   Sun-damaged skin 10/24/2012   Lower urinary tract infectious disease 10/24/2012   Back pain 04/07/2011   Allergic rhinitis 05/23/2009   Gout 01/11/2008   VARICOSE VEINS, LOWER EXTREMITIES 06/01/2007   ADJ DISORDER WITH MIXED ANXIETY & DEPRESSED MOOD  03/25/2007   Hyperlipidemia, mixed 12/29/2006   Essential hypertension 12/29/2006   Osteopenia 12/29/2006   Abdominal pain 12/29/2006   Diabetes type 2, controlled 12/29/2006   Asthma, moderate persistent 08/21/2006   GERD 08/21/2006   Osteoarthritis 08/21/2006   Phlebitis and thrombophlebitis 08/21/2006    Past Surgical History:  Procedure Laterality Date   APPENDECTOMY  1951   TONSILLECTOMY  1950   TUBAL LIGATION  1968    OB History   No obstetric history on file.      Home Medications    Prior to Admission medications   Medication Sig Start Date End Date Taking? Authorizing Provider  ACCU-CHEK GUIDE test strip CHECK BLOOD SUGAR ONE TIME WEEKLY 03/21/21   [name], MD  Accu-Chek Softclix Lancets lancets CHECK BLOOD SUGAR ONE TIME WEEKLY 03/21/21   [name], MD  albuterol (VENTOLIN HFA) 108 (90 Base) MCG/ACT inhaler Inhale 2 puffs into the lungs every 4 (four) hours as needed for wheezing or shortness of breath. 08/14/21   [name], MD  Alcohol Swabs (DROPSAFE ALCOHOL PREP) 70 % PADS USE AS DIRECTED  BEFORE  CHECKING BLOOD SUGAR 11/07/21   [name], MD  allopurinol (ZYLOPRIM) 100 MG tablet TAKE 2 TABLETS ONE TIME DAILY 02/03/22   Sandford Craze, NP  b complex vitamins tablet Take 1 tablet by mouth daily.    [provider]  Blood Glucose Calibration (ACCU-CHEK AVIVA) SOLN USE AS DIRECTED WITH GLUCOMETER 12/03/20   [name], MD  blood glucose meter kit and supplies Check blood sugars once weekly. 06/30/18   [name], MD  Blood Glucose Monitoring Suppl (ACCU-CHEK GUIDE) w/Device KIT USE AS DIRECTED TO CHECK BLOOD SUGAR 12/03/20   [name], MD  denosumab (PROLIA) 60 MG/ML SOSY injection Bring to doctors office. 02/05/22   [name], MD  docusate sodium (COLACE) 100 MG capsule Take 1 capsule (100 mg total) by mouth 2 (two) times daily. 04/14/19   Jerald Kief, MD  fluticasone (FLONASE) 50 MCG/ACT nasal spray Place 2  sprays into both nostrils daily. Patient taking differently: Place 2 sprays into both nostrils as needed. 07/24/21   [name], MD  furosemide (LASIX) 40 MG tablet TAKE 1 TABLET DAILY AND A SECOND TABLET DAILY AS NEEDED FOR INCREASED EDEMA OR WEIGHT GAIN GREATER THAN 3 POUNDS IN 24 HOURS 03/07/21   Turner, Cornelious Bryant, MD  ipratropium-albuterol (DUONEB) 0.5-2.5 (3) MG/3ML SOLN Take 3 mLs by nebulization every 4 (four) hours as needed. 08/12/21   Nyoka Cowden, MD  loratadine (CLARITIN) 10 MG tablet Take 1 tablet (10 mg total) by mouth at bedtime. Patient taking differently: Take 10 mg by mouth at bedtime as needed for allergies. 03/06/16   Bevelyn Ngo, NP  metoprolol succinate (TOPROL-XL) 50 MG 24 hr tablet TAKE 1 TABLET EVERY DAY. TAKE WITH OR IMMEDIATELY FOLLOWING A MEAL 05/01/22   Quintella Reichert, MD  OXYGEN Inhale 2 L into the lungs continuous.  [provider]  pantoprazole (PROTONIX) 40 MG tablet TAKE 1 TABLET EVERY DAY 04/22/22   [name], MD  polyethylene glycol (MIRALAX / GLYCOLAX) 17 g packet Take 17 g by mouth daily. 04/15/19   Jerald Kief, MD  pravastatin (PRAVACHOL) 40 MG tablet TAKE 1 TABLET EVERY DAY 04/22/22   [name], MD  Probiotic Product (PROBIOTIC PO) Take 1 tablet by mouth daily.     [provider]  SYMBICORT 160-4.5 MCG/ACT inhaler INHALE 2 PUFFS TWICE DAILY IN THE MORNING AND AT BEDTIME 01/17/22   [name], MD  traMADol (ULTRAM) 50 MG tablet TAKE 1 TABLET BY MOUTH EVERY 6 HOURS AS NEEDED 04/21/22   [name], MD  trolamine salicylate (ASPERCREME) 10 % cream Apply 1 application topically as needed for muscle pain.    [provider]  vitamin B-12 (CYANOCOBALAMIN) 1000 MCG tablet Take 1,000 mcg by mouth daily.     [provider]  Zoster Vaccine Adjuvanted [facility]) injection Inject into the muscle. 04/21/22   Judyann Munson, MD    Family History Family History  Problem Relation Age of Onset   Asthma Sister     Hypertension Sister    Hyperlipidemia Sister    Uterine cancer Sister    Coronary artery disease Brother        x2   Arthritis Brother    Lung cancer Brother        smoker   Hypertension Sister    Arthritis Sister    Hyperlipidemia Sister    Emphysema Sister    COPD Sister        smoker   Heart disease Sister    Hyperlipidemia Sister    Hypertension Sister    Arthritis Sister    Emphysema Brother    Heart disease Brother         smoker   Heart attack Brother    Mental illness Father    Suicidality Father    Heart disease Mother    Hyperlipidemia Mother    Heart attack Mother    Epilepsy Daughter    Hypertension Daughter    Obesity Daughter    COPD Brother        smoker   Lung cancer Brother    Coronary artery disease Other    Colon polyps Sister     Social History Social History   Tobacco Use   Smoking status: Never    Passive exposure: Past   Smokeless tobacco: Never  Vaping Use   Vaping Use: Never used  Substance Use Topics   Alcohol use: No   Drug use: No     Allergies   Montelukast sodium, Sulfa antibiotics, Sulfonamide derivatives, Ace inhibitors, and Indomethacin   Review of Systems Review of Systems  Skin:        Cat bite     Physical Exam Triage Vital Signs ED Triage Vitals  Enc Vitals Group     BP 04/07/22 1747 106/66     Pulse Rate 04/07/22 1747 95     Resp 04/07/22 1747 20     Temp 04/07/22 1747 98 F (36.7 C)     Temp Source 04/07/22 1747 Oral     SpO2 04/07/22 1747 91 %     Weight 04/07/22 1757 179 lb (81.2 kg)     Height --      Head Circumference --      Peak Flow --      Pain Score 04/07/22 1747  5     Pain Loc --      Pain Edu? --      Excl. in GC? --    No data found.  Updated Vital Signs BP 106/66 (BP Location: Left Arm)   Pulse 95   Temp 98 F (36.7 C) (Oral)   Resp 20   Wt 179 lb (81.2 kg)   SpO2 98%   BMI 29.79 kg/m   Visual Acuity Right Eye Distance:   Left Eye Distance:   Bilateral Distance:     Right Eye Near:   Left Eye Near:    Bilateral Near:     Physical Exam Vitals and nursing note reviewed.  Constitutional:      Appearance: Normal appearance.  HENT:     Head: Normocephalic and atraumatic.  Eyes:     Pupils: Pupils are equal, round, and reactive to light.  Cardiovascular:     Rate and Rhythm: Normal rate.  Pulmonary:     Effort: Pulmonary effort is normal.  Musculoskeletal:       Hands:  Skin:    General: Skin is warm and dry.  Neurological:     General: No focal deficit present.     Mental Status: She is alert and oriented to person, place, and time.  Psychiatric:        Mood and Affect: Mood normal.        Behavior: Behavior normal.      UC Treatments / Results  Labs (all labs ordered are listed, but only abnormal results are displayed) Labs Reviewed - No data to display  EKG   Radiology No results found.  Procedures Procedures (including critical care time)  Medications Ordered in UC Medications - No data to display  Initial Impression / Assessment and Plan / UC Course  I have reviewed the triage vital signs and the nursing notes.  Pertinent labs & imaging results that were available during my care of the patient were reviewed by me and considered in my medical decision making (see chart for details).     While in clinic patient called her neighbor to see if the cat was up-to-date on its vaccines.  Per patient her neighbor told her the cat is up-to-date on its rabies vaccine.  Given this information patient declined rabies vaccine in clinic. Continue Augmentin as prescribed Follow-up with your PCP if symptoms or not improving ER precautions reviewed and patient verbalized understanding Final Clinical Impressions(s) / UC Diagnoses   Final diagnoses:  Cat bite, initial encounter  Bite wound of right hand, initial encounter     Discharge Instructions      Continue Augmentin as prescribed by your doctor Follow-up with your PCP  as needed    ED Prescriptions   None    PDMP not reviewed this encounter.   Radford Pax, NP 04/07/22 1821    Radford Pax, NP 05/05/22 [phone]

## 2022-04-07 NOTE — Assessment & Plan Note (Addendum)
Patient was bit by a feral cat in her neighborhood on 04/03/2022. She received an antibiotic doxycycline and has been taking it. Unfortunately the cat was feral and not captured and no mention of rabies was had during her previous visit. She is sent to the [facility] on [address] for rabies Immunglobulin after checking they had it on site. She is swtiched to Augmentin today

## 2022-04-07 NOTE — Progress Notes (Signed)
Subjective:    Patient ID: [name], female    DOB: 08/29/1940, 82 y.o.   MRN: [id number]  Chief Complaint  Patient presents with   Follow-up    Follow up    HPI Patient is in today for follow up on chronic medical concerns. No recent febrile illness or hospitalizations. Denies CP/palp/SOB/HA/congestion/fevers/GI or GU c/o. Taking meds as prescribed. She notes she suffered a cat bite from a feral cat last Thursday and was placed on antibiotics but no rabies work up or treatment was initiated and the cat has not been captured. No fevers, chills or systemic symptoms noted. Her hand hurts but is improving.   Past Medical History:  Diagnosis Date   Abnormal glucose tolerance test    Abnormal TSH 09/22/2016   ACE-inhibitor cough    Anemia 03/12/2014   Arthritis    Asthma    PFT 02/06/09 FEV1 1.41 (65%), FVC 1.92 (64), FEV1% 74, TLC 3.47 (71%), DLCO 48%, +BD   Atypical chest pain    s/p cath, Normal coronaries, Non ST elevation myocardial infarction, Rt groin pseudoaneurysm   Bacterial vaginosis 03/12/2014   Cellulitis 06/22/2016   Chronic kidney disease (CKD), stage III (moderate) ([place]) 08/04/2016   COPD (chronic obstructive pulmonary disease) (HCC)    Depression    Diastolic heart failure ([place]) 04/07/2016   DVT (deep venous thrombosis) ([place]) 1987   GERD (gastroesophageal reflux disease)    Gout    Hypercalcemia 10/15/2014   Hyperlipidemia    Hypertension    Hypoxia 10/15/2014   Macular degeneration 04/10/2015   Osteopenia 12/29/2006   Qualifier: Diagnosis of  By: Shawna Orleans DO, Sandy Salaam    Pneumonia    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/2016   Renal insufficiency 09/22/2016   Unspecified constipation 06/05/2013   Vitamin D deficiency 01/01/2015    Past Surgical History:  Procedure Laterality Date   [place]    Family History  Problem Relation Age of Onset   Asthma Sister    Hypertension Sister    Hyperlipidemia Sister     Uterine cancer Sister    Coronary artery disease Brother        x2   Arthritis Brother    Lung cancer Brother        smoker   Hypertension Sister    Arthritis Sister    Hyperlipidemia Sister    Emphysema Sister    COPD Sister        smoker   Heart disease Sister    Hyperlipidemia Sister    Hypertension Sister    Arthritis Sister    Emphysema Brother    Heart disease Brother         smoker   Heart attack Brother    Mental illness Father    Suicidality Father    Heart disease Mother    Hyperlipidemia Mother    Heart attack Mother    Epilepsy Daughter    Hypertension Daughter    Obesity Daughter    COPD Brother        smoker   Lung cancer Brother    Coronary artery disease Other    Colon polyps Sister     Social History   Socioeconomic History   Marital status: Widowed    Spouse name: Not on file   Number of children: 1   Years of education: Not on file   Highest education level: Not on file  Occupational  History   Occupation: Retired    Fish farm manager: Physicist, medical  Tobacco Use   Smoking status: Never    Passive exposure: Past   Smokeless tobacco: Never  Vaping Use   Vaping Use: Never used  Substance and Sexual Activity   Alcohol use: No   Drug use: No   Sexual activity: Never    Comment: lives alone, no dietary restrictions  Other Topics Concern   Not on file  Social History Narrative   Not on file   Social Determinants of Health   Financial Resource Strain: Low Risk  (07/25/2020)   Overall Financial Resource Strain (CARDIA)    Difficulty of Paying Living Expenses: Not hard at all  Food Insecurity: No Food Insecurity (10/07/2021)   Hunger Vital Sign    Worried About Running Out of Food in the Last Year: Never true    [place] in the Last Year: Never true  Transportation Needs: No Transportation Needs (10/07/2021)   PRAPARE - Hydrologist (Medical): No    Lack of Transportation (Non-Medical): No  Physical Activity:  Sufficiently Active (10/07/2021)   Exercise Vital Sign    Days of Exercise per Week: 7 days    Minutes of Exercise per Session: 30 min  Stress: No Stress Concern Present (10/07/2021)   [place]    Feeling of Stress : Not at all  Social Connections: Moderately Isolated (10/07/2021)   Social Connection and Isolation Panel [NHANES]    Frequency of Communication with Friends and Family: More than three times a week    Frequency of Social Gatherings with Friends and Family: Twice a week    Attends Religious Services: 1 to 4 times per year    Active Member of [employer] or Organizations: No    Attends Archivist Meetings: Never    Marital Status: Widowed  Intimate Partner Violence: Not At Risk (07/25/2020)   Humiliation, Afraid, Rape, and Kick questionnaire    Fear of Current or Ex-Partner: No    Emotionally Abused: No    Physically Abused: No    Sexually Abused: No    Outpatient Medications Prior to Visit  Medication Sig Dispense Refill   ACCU-CHEK GUIDE test strip CHECK BLOOD SUGAR ONE TIME WEEKLY 100 strip 1   Accu-Chek Softclix Lancets lancets CHECK BLOOD SUGAR ONE TIME WEEKLY 100 each 1   albuterol (VENTOLIN HFA) 108 (90 Base) MCG/ACT inhaler Inhale 2 puffs into the lungs every 4 (four) hours as needed for wheezing or shortness of breath. 18 g 3   Alcohol Swabs (DROPSAFE ALCOHOL PREP) 70 % PADS USE AS DIRECTED  BEFORE  CHECKING BLOOD SUGAR 100 each 10   allopurinol (ZYLOPRIM) 100 MG tablet TAKE 2 TABLETS ONE TIME DAILY 180 tablet 3   b complex vitamins tablet Take 1 tablet by mouth daily.     bacitracin ointment Apply 1 application topically 2 (two) times daily. 120 g 0   Blood Glucose Calibration (ACCU-CHEK AVIVA) SOLN USE AS DIRECTED WITH GLUCOMETER 1 each 0   blood glucose meter kit and supplies Check blood sugars once weekly. 1 each 0   Blood Glucose Monitoring Suppl (ACCU-CHEK GUIDE) w/Device KIT USE AS DIRECTED  TO CHECK BLOOD SUGAR 1 kit 1   denosumab (PROLIA) 60 MG/ML SOSY injection Bring to doctors office. 1 mL 0   docusate sodium (COLACE) 100 MG capsule Take 1 capsule (100 mg total) by mouth 2 (two) times daily.  10 capsule 0   fluticasone (FLONASE) 50 MCG/ACT nasal spray Place 2 sprays into both nostrils daily. 48 g 1   furosemide (LASIX) 40 MG tablet TAKE 1 TABLET DAILY AND A SECOND TABLET DAILY AS NEEDED FOR INCREASED EDEMA OR WEIGHT GAIN GREATER THAN 3 POUNDS IN 24 HOURS 100 tablet 5   ipratropium-albuterol (DUONEB) 0.5-2.5 (3) MG/3ML SOLN Take 3 mLs by nebulization every 4 (four) hours as needed. 360 mL 11   loratadine (CLARITIN) 10 MG tablet Take 1 tablet (10 mg total) by mouth at bedtime. (Patient taking differently: Take 10 mg by mouth at bedtime as needed for allergies.) 30 tablet 5   metoprolol succinate (TOPROL-XL) 50 MG 24 hr tablet Take 1 tablet (50 mg total) by mouth daily. Take with or immediately following a meal. 90 tablet 3   mupirocin ointment (BACTROBAN) 2 % Apply 1 Application topically 2 (two) times daily. 30 g 0   OXYGEN Inhale 2 L into the lungs continuous.      pantoprazole (PROTONIX) 40 MG tablet Take 1 tablet (40 mg total) by mouth daily. 90 tablet 1   polyethylene glycol (MIRALAX / GLYCOLAX) 17 g packet Take 17 g by mouth daily. 14 each 0   pravastatin (PRAVACHOL) 40 MG tablet TAKE 1 TABLET EVERY DAY 90 tablet 1   Probiotic Product (PROBIOTIC PO) Take 1 tablet by mouth daily.      SYMBICORT 160-4.5 MCG/ACT inhaler INHALE 2 PUFFS TWICE DAILY IN THE MORNING AND AT BEDTIME 3 each 3   traMADol (ULTRAM) 50 MG tablet TAKE 1 TABLET BY MOUTH EVERY 6 HOURS AS NEEDED 90 tablet 0   trolamine salicylate (ASPERCREME) 10 % cream Apply 1 application topically as needed for muscle pain.     vitamin B-12 (CYANOCOBALAMIN) 1000 MCG tablet Take 1,000 mcg by mouth daily.      acetaminophen (TYLENOL) 500 MG tablet Take 500 mg by mouth 2 (two) times daily.      No facility-administered  medications prior to visit.    Allergies  Allergen Reactions   Montelukast Sodium Palpitations and Other (See Comments)    REACTION: HEART PALPITATIONS, CHEST PAIN.   Sulfa Antibiotics Other (See Comments)    CHEST PAIN   Sulfonamide Derivatives Other (See Comments)    CHEST PAIN   Ace Inhibitors Other (See Comments)    PT. STATED UNKNOWN REACTION   Indomethacin Other (See Comments)    PT. STATED UNKNOWN REACTION    Review of Systems  Constitutional:  Positive for malaise/fatigue. Negative for fever.  HENT:  Negative for congestion.   Eyes:  Negative for blurred vision.  Respiratory:  Negative for shortness of breath.   Cardiovascular:  Negative for chest pain, palpitations and leg swelling.  Gastrointestinal:  Negative for abdominal pain, blood in stool and nausea.  Genitourinary:  Negative for dysuria and frequency.  Musculoskeletal:  Positive for joint pain. Negative for falls.  Skin:  Positive for rash.  Neurological:  Negative for dizziness, loss of consciousness and headaches.  Endo/Heme/Allergies:  Negative for environmental allergies.  Psychiatric/Behavioral:  Negative for depression. The patient is not nervous/anxious.        Objective:    Physical Exam Constitutional:      General: She is not in acute distress.    Appearance: Normal appearance. She is not diaphoretic.  HENT:     Head: Normocephalic and atraumatic.     Right Ear: Tympanic membrane, ear canal and external ear normal.     Left Ear: Tympanic membrane,  ear canal and external ear normal.     Nose: Nose normal.     Mouth/Throat:     Mouth: Mucous membranes are moist.     Pharynx: Oropharynx is clear. No oropharyngeal exudate.  Eyes:     General: No scleral icterus.       Right eye: No discharge.        Left eye: No discharge.     Conjunctiva/sclera: Conjunctivae normal.     Pupils: Pupils are equal, round, and reactive to light.  Neck:     Thyroid: No thyromegaly.  Cardiovascular:     Rate  and Rhythm: Normal rate and regular rhythm.     Heart sounds: Normal heart sounds. No murmur heard. Pulmonary:     Effort: Pulmonary effort is normal. No respiratory distress.     Breath sounds: Normal breath sounds. No wheezing or rales.  Abdominal:     General: Bowel sounds are normal. There is no distension.     Palpations: Abdomen is soft. There is no mass.     Tenderness: There is no abdominal tenderness.  Musculoskeletal:        General: No tenderness. Normal range of motion.     Cervical back: Normal range of motion and neck supple.  Lymphadenopathy:     Cervical: No cervical adenopathy.  Skin:    General: Skin is warm and dry.     Findings: Erythema present. No rash.     Comments: Right hand second and third PIP joints, red, hot swollen  Neurological:     General: No focal deficit present.     Mental Status: She is alert and oriented to person, place, and time.     Cranial Nerves: No cranial nerve deficit.     Coordination: Coordination normal.     Deep Tendon Reflexes: Reflexes are normal and symmetric. Reflexes normal.  Psychiatric:        Mood and Affect: Mood normal.        Behavior: Behavior normal.        Thought Content: Thought content normal.        Judgment: Judgment normal.     BP 130/64 (BP Location: Right Arm, Patient Position: Sitting, Cuff Size: Normal)   Pulse 86   Temp (!) 97.5 F (36.4 C) (Oral)   Resp 16   Ht 5\' 5"  (1.651 m)   Wt 177 lb (80.3 kg)   SpO2 96%   BMI 29.45 kg/m  Wt Readings from Last 3 Encounters:  04/07/22 177 lb (80.3 kg)  01/03/22 177 lb 9.6 oz (80.6 kg)  12/26/21 179 lb 1.9 oz (81.2 kg)    Diabetic Foot Exam - Simple   No data filed    Lab Results  Component Value Date   WBC 5.6 03/03/2022   HGB 9.6 (L) 03/03/2022   HCT 32.1 (L) 03/03/2022   PLT 127 (L) 03/03/2022   GLUCOSE 98 03/03/2022   CHOL 124 11/12/2020   TRIG 106.0 11/12/2020   HDL 50.40 11/12/2020   LDLDIRECT 73.0 06/16/2017   LDLCALC 53 11/12/2020    ALT 6 03/03/2022   AST 11 (L) 03/03/2022   NA 141 03/03/2022   K 4.6 03/03/2022   CL 102 03/03/2022   CREATININE 1.40 (H) 03/03/2022   BUN 29 (H) 03/03/2022   CO2 33 (H) 03/03/2022   TSH 3.55 09/24/2021   INR 1.0 09/12/2006   HGBA1C 5.4 01/03/2022   MICROALBUR <0.7 01/03/2022    Lab Results  Component Value Date  TSH 3.55 09/24/2021   Lab Results  Component Value Date   WBC 5.6 03/03/2022   HGB 9.6 (L) 03/03/2022   HCT 32.1 (L) 03/03/2022   MCV 101.9 (H) 03/03/2022   PLT 127 (L) 03/03/2022   Lab Results  Component Value Date   NA 141 03/03/2022   K 4.6 03/03/2022   CO2 33 (H) 03/03/2022   GLUCOSE 98 03/03/2022   BUN 29 (H) 03/03/2022   CREATININE 1.40 (H) 03/03/2022   BILITOT 0.4 03/03/2022   ALKPHOS 37 (L) 03/03/2022   AST 11 (L) 03/03/2022   ALT 6 03/03/2022   PROT 6.6 03/03/2022   ALBUMIN 3.9 03/03/2022   CALCIUM 9.9 03/03/2022   ANIONGAP 6 03/03/2022   GFR 27.14 (L) 01/03/2022   Lab Results  Component Value Date   CHOL 124 11/12/2020   Lab Results  Component Value Date   HDL 50.40 11/12/2020   Lab Results  Component Value Date   LDLCALC 53 11/12/2020   Lab Results  Component Value Date   TRIG 106.0 11/12/2020   Lab Results  Component Value Date   CHOLHDL 2 11/12/2020   Lab Results  Component Value Date   HGBA1C 5.4 01/03/2022       Assessment & Plan:  Controlled type 2 diabetes mellitus with chronic kidney disease, without long-term current use of insulin, unspecified CKD stage (HCC) Assessment & Plan: hgba1c acceptable, minimize simple carbs. Increase exercise as tolerated. Continue current meds   Orders: -     Hemoglobin A1c  Essential hypertension Assessment & Plan: Well controlled, no changes to meds. Encouraged heart healthy diet such as the DASH diet and exercise as tolerated.    Orders: -     CBC with Differential/Platelet -     Comprehensive metabolic panel -     TSH  Hyperlipidemia, mixed Assessment &  Plan: Encourage heart healthy diet such as MIND or DASH diet, increase exercise, avoid trans fats, simple carbohydrates and processed foods, consider a krill or fish or flaxseed oil cap daily. Tolerating Pravastatin  Orders: -     Lipid panel  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[place]) Assessment & Plan: Hydrate and monitor    Vitamin D deficiency Assessment & Plan: Supplement and monitor    Thrombocytopenia (HCC) Assessment & Plan: Asymptomatic, will monitor   Myalgia Assessment & Plan: Hydrate and monitor    Cat bite of hand, right, subsequent encounter Assessment & Plan: Patient was bit by a f[place] cat in her neighborhood on 04/03/2022. She received an antibiotic doxycycline and has been taking it. Unfortunately the cat was feral and not captured and no mention of rabies was had during her previous visit. She is sent to the [facility] on [address] for rabies Immunglobulin after checking they had it on site. She is swtiched to Augmentin today   Orders: -     DG Hand Complete Right; Future  Other orders -     Amoxicillin-Pot Clavulanate; Take 1 tablet by mouth 2 (two) times daily.  Dispense: 20 tablet; Refill: 0    Penni Homans, MD

## 2022-04-08 ENCOUNTER — Inpatient Hospital Stay: Payer: Medicare HMO | Admitting: Medical Oncology

## 2022-04-08 ENCOUNTER — Inpatient Hospital Stay: Payer: Medicare HMO

## 2022-04-17 ENCOUNTER — Ambulatory Visit: Payer: Medicare HMO | Attending: Physician Assistant | Admitting: Physician Assistant

## 2022-04-17 ENCOUNTER — Encounter: Payer: Self-pay | Admitting: Physician Assistant

## 2022-04-17 VITALS — BP 108/60 | HR 86 | Ht 65.0 in | Wt 177.0 lb

## 2022-04-17 DIAGNOSIS — I10 Essential (primary) hypertension: Secondary | ICD-10-CM | POA: Diagnosis not present

## 2022-04-17 DIAGNOSIS — R0602 Shortness of breath: Secondary | ICD-10-CM

## 2022-04-17 DIAGNOSIS — I5032 Chronic diastolic (congestive) heart failure: Secondary | ICD-10-CM

## 2022-04-17 DIAGNOSIS — D649 Anemia, unspecified: Secondary | ICD-10-CM | POA: Diagnosis not present

## 2022-04-17 DIAGNOSIS — N1831 Chronic kidney disease, stage 3a: Secondary | ICD-10-CM | POA: Diagnosis not present

## 2022-04-17 DIAGNOSIS — R609 Edema, unspecified: Secondary | ICD-10-CM

## 2022-04-17 DIAGNOSIS — E782 Mixed hyperlipidemia: Secondary | ICD-10-CM

## 2022-04-17 DIAGNOSIS — I5189 Other ill-defined heart diseases: Secondary | ICD-10-CM

## 2022-04-17 NOTE — Patient Instructions (Signed)
Medication Instructions:  Take lasix 40 mg twice a day for 3 days then resume 40 mg daily dose *If you need a refill on your cardiac medications before your next appointment, please call your pharmacy*   Lab Work: Have your primary care provider draw a BMET when you see them on Monday If you have labs (blood work) drawn today and your tests are completely normal, you will receive your results only by: [place] (if you have MyChart) OR A paper copy in the mail If you have any lab test that is abnormal or we need to change your treatment, we will call you to review the results.   Testing/Procedures: Your physician has requested that you have an echocardiogram in September. Echocardiography is a painless test that uses sound waves to create images of your heart. It provides your doctor with information about the size and shape of your heart and how well your heart's chambers and valves are working. This procedure takes approximately one hour. There are no restrictions for this procedure. Please do NOT wear cologne, perfume, aftershave, or lotions (deodorant is allowed). Please arrive 15 minutes prior to your appointment time.    Follow-Up: At [facility], you and your health needs are our priority.  As part of our continuing mission to provide you with exceptional heart care, we have created designated Provider Care Teams.  These Care Teams include your primary Cardiologist (physician) and Advanced Practice Providers (APPs -  Physician Assistants and Nurse Practitioners) who all work together to provide you with the care you need, when you need it.   Your next appointment:   6 month(s)  Provider:   Fransico Him, MD    Other Instructions 1.Check your blood pressure daily, one hour after taking your morning medications for 2 weeks, keep a log and send [place] the readings through your mychart at the end of the 2 weeks 2.Weigh every morning after using the restroom, before  breakfast and let us know if your have a weight gain of 2 or more lbs overnight or 5 lbs or more in 1 week  Low-Sodium Eating Plan Sodium, which is an element that makes up salt, helps you maintain a healthy balance of fluids in your body. Too much sodium can increase your blood pressure and cause fluid and waste to be held in your body. Your health care provider or dietitian may recommend following this plan if you have high blood pressure (hypertension), kidney disease, liver disease, or heart failure. Eating less sodium can help lower your blood pressure, reduce swelling, and protect your heart, liver, and kidneys. What are tips for following this plan? Reading food labels The Nutrition Facts label lists the amount of sodium in one serving of the food. If you eat more than one serving, you must multiply the listed amount of sodium by the number of servings. Choose foods with less than 140 mg of sodium per serving. Avoid foods with 300 mg of sodium or more per serving. Shopping  Look for lower-sodium products, often labeled as "low-sodium" or "no salt added." Always check the sodium content, even if foods are labeled as "unsalted" or "no salt added." Buy fresh foods. Avoid canned foods and pre-made or frozen meals. Avoid canned, cured, or processed meats. Buy breads that have less than 80 mg of sodium per slice. Cooking  Eat more home-cooked food and less restaurant, buffet, and fast food. Avoid adding salt when cooking. Use salt-free seasonings or herbs instead of table salt  or sea salt. Check with your health care provider or pharmacist before using salt substitutes. Cook with plant-based oils, such as canola, sunflower, or olive oil. Meal planning When eating at a restaurant, ask that your food be prepared with less salt or no salt, if possible. Avoid dishes labeled as brined, pickled, cured, smoked, or made with soy sauce, miso, or teriyaki sauce. Avoid foods that contain MSG  (monosodium glutamate). MSG is sometimes added to [place] food, bouillon, and some canned foods. Make meals that can be grilled, baked, poached, roasted, or steamed. These are generally made with less sodium. General information Most people on this plan should limit their sodium intake to 1,500-2,000 mg (milligrams) of sodium each day. What foods should I eat? Fruits Fresh, frozen, or canned fruit. Fruit juice. Vegetables Fresh or frozen vegetables. "No salt added" canned vegetables. "No salt added" tomato sauce and paste. Low-sodium or reduced-sodium tomato and vegetable juice. Grains Low-sodium cereals, including oats, puffed wheat and rice, and shredded wheat. Low-sodium crackers. Unsalted rice. Unsalted pasta. Low-sodium bread. Whole-grain breads and whole-grain pasta. Meats and other proteins Fresh or frozen (no salt added) meat, poultry, seafood, and fish. Low-sodium canned tuna and salmon. Unsalted nuts. Dried peas, beans, and lentils without added salt. Unsalted canned beans. Eggs. Unsalted nut butters. Dairy Milk. Soy milk. Cheese that is naturally low in sodium, such as ricotta cheese, fresh mozzarella, or Swiss cheese. Low-sodium or reduced-sodium cheese. Cream cheese. Yogurt. Seasonings and condiments Fresh and dried herbs and spices. Salt-free seasonings. Low-sodium mustard and ketchup. Sodium-free salad dressing. Sodium-free light mayonnaise. Fresh or refrigerated horseradish. Lemon juice. Vinegar. Other foods Homemade, reduced-sodium, or low-sodium soups. Unsalted popcorn and pretzels. Low-salt or salt-free chips. The items listed above may not be a complete list of foods and beverages you can eat. Contact a dietitian for more information. What foods should I avoid? Vegetables Sauerkraut, pickled vegetables, and relishes. Olives. [place] fries. Onion rings. Regular canned vegetables (not low-sodium or reduced-sodium). Regular canned tomato sauce and paste (not low-sodium or  reduced-sodium). Regular tomato and vegetable juice (not low-sodium or reduced-sodium). Frozen vegetables in sauces. Grains Instant hot cereals. Bread stuffing, pancake, and biscuit mixes. Croutons. Seasoned rice or pasta mixes. Noodle soup cups. Boxed or frozen macaroni and cheese. Regular salted crackers. Self-rising flour. Meats and other proteins Meat or fish that is salted, canned, smoked, spiced, or pickled. Precooked or cured meat, such as sausages or meat loaves. [place] Salines. Ham. Pepperoni. Hot dogs. Corned beef. Chipped beef. Salt pork. Jerky. Pickled herring. Anchovies and sardines. Regular canned tuna. Salted nuts. Dairy Processed cheese and cheese spreads. Hard cheeses. Cheese curds. Blue cheese. Feta cheese. String cheese. Regular cottage cheese. Buttermilk. Canned milk. Fats and oils Salted butter. Regular margarine. Ghee. Bacon fat. Seasonings and condiments Onion salt, garlic salt, seasoned salt, table salt, and sea salt. Canned and packaged gravies. Worcestershire sauce. Tartar sauce. Barbecue sauce. Teriyaki sauce. Soy sauce, including reduced-sodium. Steak sauce. Fish sauce. Oyster sauce. Cocktail sauce. Horseradish that you find on the shelf. Regular ketchup and mustard. Meat flavorings and tenderizers. Bouillon cubes. Hot sauce. Pre-made or packaged marinades. Pre-made or packaged taco seasonings. Relishes. Regular salad dressings. Salsa. Other foods Salted popcorn and pretzels. Corn chips and puffs. Potato and tortilla chips. Canned or dried soups. Pizza. Frozen entrees and pot pies. The items listed above may not be a complete list of foods and beverages you should avoid. Contact a dietitian for more information. Summary Eating less sodium can help lower your blood pressure, reduce swelling,  and protect your heart, liver, and kidneys. Most people on this plan should limit their sodium intake to 1,500-2,000 mg (milligrams) of sodium each day. Canned, boxed, and frozen foods are high  in sodium. Restaurant foods, fast foods, and pizza are also very high in sodium. You also get sodium by adding salt to food. Try to cook at home, eat more fresh fruits and vegetables, and eat less fast food and canned, processed, or prepared foods. This information is not intended to replace advice given to you by your health care provider. Make sure you discuss any questions you have with your health care provider. Document Revised: 12/13/2018 Document Reviewed: 12/08/2018 Elsevier Patient Education  [place] Many factors influence your heart health, including eating and exercise habits. Heart health is also called coronary health. Coronary risk increases with abnormal blood fat (lipid) levels. A heart-healthy eating plan includes limiting unhealthy fats, increasing healthy fats, limiting salt (sodium) intake, and making other diet and lifestyle changes. What is my plan? Your health care provider may recommend that: You limit your fat intake to _________% or less of your total calories each day. You limit your saturated fat intake to _________% or less of your total calories each day. You limit the amount of cholesterol in your diet to less than _________ mg per day. You limit the amount of sodium in your diet to less than _________ mg per day. What are tips for following this plan? Cooking Cook foods using methods other than frying. Baking, boiling, grilling, and broiling are all good options. Other ways to reduce fat include: Removing the skin from poultry. Removing all visible fats from meats. Steaming vegetables in water or broth. Meal planning  At meals, imagine dividing your plate into fourths: Fill one-half of your plate with vegetables and green salads. Fill one-fourth of your plate with whole grains. Fill one-fourth of your plate with lean protein foods. Eat 2-4 cups of vegetables per day. One cup of vegetables equals 1 cup (91 g) broccoli or  cauliflower florets, 2 medium carrots, 1 large bell pepper, 1 large sweet potato, 1 large tomato, 1 medium white potato, 2 cups (150 g) raw leafy greens. Eat 1-2 cups of fruit per day. One cup of fruit equals 1 small apple, 1 large banana, 1 cup (237 g) mixed fruit, 1 large orange,  cup (82 g) dried fruit, 1 cup (240 mL) 100% fruit juice. Eat more foods that contain soluble fiber. Examples include apples, broccoli, carrots, beans, peas, and barley. Aim to get 25-30 g of fiber per day. Increase your consumption of legumes, nuts, and seeds to 4-5 servings per week. One serving of dried beans or legumes equals  cup (90 g) cooked, 1 serving of nuts is  oz (12 almonds, 24 pistachios, or 7 walnut halves), and 1 serving of seeds equals  oz (8 g). Fats Choose healthy fats more often. Choose monounsaturated and polyunsaturated fats, such as olive and canola oils, avocado oil, flaxseeds, walnuts, almonds, and seeds. Eat more omega-3 fats. Choose salmon, mackerel, sardines, tuna, flaxseed oil, and ground flaxseeds. Aim to eat fish at least 2 times each week. Check food labels carefully to identify foods with trans fats or high amounts of saturated fat. Limit saturated fats. These are found in animal products, such as meats, butter, and cream. Plant sources of saturated fats include palm oil, palm kernel oil, and coconut oil. Avoid foods with partially hydrogenated oils in them. These contain trans fats. Examples  are stick margarine, some tub margarines, cookies, crackers, and other baked goods. Avoid fried foods. General information Eat more home-cooked food and less restaurant, buffet, and fast food. Limit or avoid alcohol. Limit foods that are high in added sugar and simple starches such as foods made using white refined flour (white breads, pastries, sweets). Lose weight if you are overweight. Losing just 5-10% of your body weight can help your overall health and prevent diseases such as diabetes and  heart disease. Monitor your sodium intake, especially if you have high blood pressure. Talk with your health care provider about your sodium intake. Try to incorporate more vegetarian meals weekly. What foods should I eat? Fruits All fresh, canned (in natural juice), or frozen fruits. Vegetables Fresh or frozen vegetables (raw, steamed, roasted, or grilled). Green salads. Grains Most grains. Choose whole wheat and whole grains most of the time. Rice and pasta, including brown rice and pastas made with whole wheat. Meats and other proteins Lean, well-trimmed beef, veal, pork, and lamb. Chicken and [place] without skin. All fish and shellfish. Wild duck, rabbit, pheasant, and venison. Egg whites or low-cholesterol egg substitutes. Dried beans, peas, lentils, and tofu. Seeds and most nuts. Dairy Low-fat or nonfat cheeses, including ricotta and mozzarella. Skim or 1% milk (liquid, powdered, or evaporated). Buttermilk made with low-fat milk. Nonfat or low-fat yogurt. Fats and oils Non-hydrogenated (trans-free) margarines. Vegetable oils, including soybean, sesame, sunflower, olive, avocado, peanut, safflower, corn, canola, and cottonseed. Salad dressings or mayonnaise made with a vegetable oil. Beverages Water (mineral or sparkling). Coffee and tea. Unsweetened ice tea. Diet beverages. Sweets and desserts Sherbet, gelatin, and fruit ice. Small amounts of dark chocolate. Limit all sweets and desserts. Seasonings and condiments All seasonings and condiments. The items listed above may not be a complete list of foods and beverages you can eat. Contact a dietitian for more options. What foods should I avoid? Fruits Canned fruit in heavy syrup. Fruit in cream or butter sauce. Fried fruit. Limit coconut. Vegetables Vegetables cooked in cheese, cream, or butter sauce. Fried vegetables. Grains Breads made with saturated or trans fats, oils, or whole milk. Croissants. Sweet rolls. Donuts. High-fat  crackers, such as cheese crackers and chips. Meats and other proteins Fatty meats, such as hot dogs, ribs, sausage, bacon, rib-eye roast or steak. High-fat deli meats, such as salami and bologna. Caviar. Domestic duck and goose. Organ meats, such as liver. Dairy Cream, sour cream, cream cheese, and creamed cottage cheese. Whole-milk cheeses. Whole or 2% milk (liquid, evaporated, or condensed). Whole buttermilk. Cream sauce or high-fat cheese sauce. Whole-milk yogurt. Fats and oils Meat fat, or shortening. Cocoa butter, hydrogenated oils, palm oil, coconut oil, palm kernel oil. Solid fats and shortenings, including bacon fat, salt pork, lard, and butter. Nondairy cream substitutes. Salad dressings with cheese or sour cream. Beverages Regular sodas and any drinks with added sugar. Sweets and desserts Frosting. Pudding. Cookies. Cakes. Pies. Milk chocolate or white chocolate. Buttered syrups. Full-fat ice cream or ice cream drinks. The items listed above may not be a complete list of foods and beverages to avoid. Contact a dietitian for more information. Summary Heart-healthy meal planning includes limiting unhealthy fats, increasing healthy fats, limiting salt (sodium) intake and making other diet and lifestyle changes. Lose weight if you are overweight. Losing just 5-10% of your body weight can help your overall health and prevent diseases such as diabetes and heart disease. Focus on eating a balance of foods, including fruits and vegetables, low-fat or nonfat dairy,  lean protein, nuts and legumes, whole grains, and heart-healthy oils and fats. This information is not intended to replace advice given to you by your health care provider. Make sure you discuss any questions you have with your health care provider. Document Revised: 02/11/2021 Document Reviewed: 02/11/2021 Elsevier Patient Education  [place].

## 2022-04-17 NOTE — Progress Notes (Signed)
Office Visit    Patient Name: [name] Date of Encounter: 04/17/2022  PCP:  Mosie Lukes, MD   [place]  Cardiologist:  Fransico Him, MD  Advanced Practice Provider:  No care team member to display Electrophysiologist:  None   HPI    [name] is a 82 y.o. female  with a past medical history of asthma, atypical chest pain with normal coronary arteries by cath 2008 in the setting of NSTEMI complicated by right groin pseudoaneurysm, chronic diastolic CHF, GERD, HTN, and hyperlipidemia presents today for follow-up visit.  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  Last echo showed normal LVEF with mild MR and grade 1 DD.  She has chronic DOE/SOB related to her COPD and breathing is stable on 2 L nasal cannula.  When she was last seen in the clinic 02/2021 she was doing well.  Has chronic SOB from COPD on 2 L all the time.  Denied anginal chest pain or pressure, PND, orthopnea, palpitations, or syncope.  Chronic lower extremity edema when she has been up on her legs but is very stable.  Compliant with medications and tolerating meds with no side effects.  Today, she still continues to struggle with chronic shortness of breath which has been consistent and lower extremity edema.  She does have an extra dose of Lasix to take as needed.  She is on 40 mg daily.  She feels like she is on oxygen because of the heart.  I explained that it is likely a multi factorial reason.  We reviewed her last echocardiogram from September of last year which showed diastolic dysfunction.  She also had some mild MR.  It is possible that some of her shortness of breath is coming from her heart but she has dealt with shortness of breath for many years and also holds a diagnosis of COPD.  We will plan for annual echocardiograms to monitor her mitral valve closely.  We discussed avoiding salty foods, daily weighs and elevating  feet/using lower extremity compression when able.  Reports no shortness of breath nor dyspnea on exertion. Reports no chest pain, pressure, or tightness. No orthopnea, PND. Reports no palpitations.   Past Medical History    Past Medical History:  Diagnosis Date   Abnormal glucose tolerance test    Abnormal TSH 09/22/2016   ACE-inhibitor cough    Anemia 03/12/2014   Arthritis    Asthma    PFT 02/06/09 FEV1 1.41 (65%), FVC 1.92 (64), FEV1% 74, TLC 3.47 (71%), DLCO 48%, +BD   Atypical chest pain    s/p cath, Normal coronaries, Non ST elevation myocardial infarction, Rt groin pseudoaneurysm   Bacterial vaginosis 03/12/2014   Cellulitis 06/22/2016   Chronic kidney disease (CKD), stage III (moderate) (HCC) 08/04/2016   COPD (chronic obstructive pulmonary disease) (HCC)    Depression    Diastolic heart failure ([place]) 04/07/2016   DVT (deep venous thrombosis) ([place]) 1987   GERD (gastroesophageal reflux disease)    Gout    Hypercalcemia 10/15/2014   Hyperlipidemia    Hypertension    Hypoxia 10/15/2014   Macular degeneration 04/10/2015   Osteopenia 12/29/2006   Qualifier: Diagnosis of  By: Wynona Luna    Pneumonia    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   Sulfa Antibiotics Other (See Comments)    CHEST PAIN   Sulfonamide Derivatives Other (See Comments)    CHEST PAIN   Ace Inhibitors Other (See Comments)    PT. STATED UNKNOWN REACTION   Indomethacin Other (See Comments)    PT. STATED UNKNOWN REACTION     EKGs/Labs/Other Studies Reviewed:   The following studies were reviewed today:  Echo  10/02/2021 IMPRESSIONS     1. TDS. Left ventricular ejection fraction, by estimation, is 50 to 55%.  The left ventricle has low normal function. The left ventricle has no  regional wall motion abnormalities. Left ventricular diastolic parameters  are consistent with Grade I  diastolic dysfunction (impaired relaxation).   2. Right ventricular systolic function is normal. The right ventricular  size is normal.   3. Left atrial size was mild to moderately dilated.   4. The mitral valve is normal in structure. Mild mitral valve  regurgitation. No evidence of mitral stenosis.   5. The aortic valve is normal in structure. Aortic valve regurgitation is  not visualized. Aortic valve sclerosis/calcification is present, without  any evidence of aortic stenosis.   6. The inferior vena cava is normal in size with greater than 50%  respiratory variability, suggesting right atrial pressure of 3 mmHg.   FINDINGS   Left Ventricle: TDS. Left ventricular ejection fraction, by estimation,  is 50 to 55%. The left ventricle has low normal function. The left  ventricle has no regional wall motion abnormalities. The left ventricular  internal cavity size was normal in size.   There is no left ventricular hypertrophy. Left ventricular diastolic  parameters are consistent with Grade I diastolic dysfunction (impaired  relaxation).   Right Ventricle: The right ventricular size is normal. No increase in  right ventricular wall thickness. Right ventricular systolic function is  normal.   Left Atrium: Left atrial size was mild to moderately dilated.   Right Atrium: Right atrial size was normal in size.   Pericardium: There is no evidence of pericardial effusion.   Mitral Valve: The mitral valve is normal in structure. Mild mitral annular  calcification. Mild mitral valve regurgitation. No evidence of mitral  valve stenosis.   Tricuspid Valve: The tricuspid valve is normal in structure. Tricuspid  valve  regurgitation is not demonstrated. No evidence of tricuspid  stenosis.   Aortic Valve: The aortic valve is normal in structure. Aortic valve  regurgitation is not visualized. Aortic valve sclerosis/calcification is  present, without any evidence of aortic stenosis. Aortic valve mean  gradient measures 7.0 mmHg. Aortic valve peak   gradient measures 11.2 mmHg. Aortic valve area, by VTI measures 1.51 cm.   Pulmonic Valve: The pulmonic valve was normal in structure. Pulmonic valve  regurgitation is not visualized. No evidence of pulmonic stenosis.   Aorta: The aortic root is normal in size and structure.   Venous: The inferior vena cava is normal in size with greater than 50%  respiratory variability, suggesting right atrial pressure of 3 mmHg.   IAS/Shunts: No atrial level shunt detected by color flow Doppler.   EKG:  EKG is not ordered today.  Recent Labs: 09/24/2021: TSH 3.55 03/03/2022: ALT 6; BUN 29; Creatinine 1.40; Hemoglobin 9.6; Platelet  Count 127; Potassium 4.6; Sodium 141  Recent Lipid Panel    Component Value Date/Time   CHOL 124 11/12/2020 1628   CHOL 151 07/27/2019 1701   TRIG 106.0 11/12/2020 1628   TRIG 122 01/27/2006 1037   HDL 50.40 11/12/2020 1628   HDL 47 07/27/2019 1701   CHOLHDL 2 11/12/2020 1628   VLDL 21.2 11/12/2020 1628   LDLCALC 53 11/12/2020 1628   LDLCALC 75 07/27/2019 1701   LDLDIRECT 73.0 06/16/2017 1543     Home Medications   Current Meds  Medication Sig   ACCU-CHEK GUIDE test strip CHECK BLOOD SUGAR ONE TIME WEEKLY   Accu-Chek Softclix Lancets lancets CHECK BLOOD SUGAR ONE TIME WEEKLY   albuterol (VENTOLIN HFA) 108 (90 Base) MCG/ACT inhaler Inhale 2 puffs into the lungs every 4 (four) hours as needed for wheezing or shortness of breath.   Alcohol Swabs (DROPSAFE ALCOHOL PREP) 70 % PADS USE AS DIRECTED  BEFORE  CHECKING BLOOD SUGAR   allopurinol (ZYLOPRIM) 100 MG tablet TAKE 2 TABLETS ONE TIME DAILY   b complex vitamins tablet Take 1 tablet  by mouth daily.   bacitracin ointment Apply 1 application topically 2 (two) times daily.   Blood Glucose Calibration (ACCU-CHEK AVIVA) SOLN USE AS DIRECTED WITH GLUCOMETER   blood glucose meter kit and supplies Check blood sugars once weekly.   Blood Glucose Monitoring Suppl (ACCU-CHEK GUIDE) w/Device KIT USE AS DIRECTED TO CHECK BLOOD SUGAR   cefdinir (OMNICEF) 300 MG capsule Take 300 mg by mouth 2 (two) times daily.   denosumab (PROLIA) 60 MG/ML SOSY injection Bring to doctors office.   docusate sodium (COLACE) 100 MG capsule Take 1 capsule (100 mg total) by mouth 2 (two) times daily.   fluticasone (FLONASE) 50 MCG/ACT nasal spray Place 2 sprays into both nostrils daily. (Patient taking differently: Place 2 sprays into both nostrils as needed.)   furosemide (LASIX) 40 MG tablet TAKE 1 TABLET DAILY AND A SECOND TABLET DAILY AS NEEDED FOR INCREASED EDEMA OR WEIGHT GAIN GREATER THAN 3 POUNDS IN 24 HOURS   ipratropium-albuterol (DUONEB) 0.5-2.5 (3) MG/3ML SOLN Take 3 mLs by nebulization every 4 (four) hours as needed.   loratadine (CLARITIN) 10 MG tablet Take 1 tablet (10 mg total) by mouth at bedtime. (Patient taking differently: Take 10 mg by mouth at bedtime as needed for allergies.)   metoprolol succinate (TOPROL-XL) 50 MG 24 hr tablet Take 1 tablet (50 mg total) by mouth daily. Take with or immediately following a meal.   mupirocin ointment (BACTROBAN) 2 % Apply 1 Application topically 2 (two) times daily.   OXYGEN Inhale 2 L into the lungs continuous.    pantoprazole (PROTONIX) 40 MG tablet Take 1 tablet (40 mg total) by mouth daily.   polyethylene glycol (MIRALAX / GLYCOLAX) 17 g packet Take 17 g by mouth daily.   pravastatin (PRAVACHOL) 40 MG tablet TAKE 1 TABLET EVERY DAY   predniSONE (DELTASONE) 20 MG tablet Take 20 mg by mouth daily at 6 (six) AM.   Probiotic Product (PROBIOTIC PO) Take 1 tablet by mouth daily.    SYMBICORT 160-4.5 MCG/ACT inhaler INHALE 2 PUFFS TWICE DAILY IN THE  MORNING AND AT BEDTIME   traMADol (ULTRAM) 50 MG tablet TAKE 1 TABLET BY MOUTH EVERY 6 HOURS AS NEEDED   trolamine salicylate (ASPERCREME) 10 % cream Apply 1 application topically as needed for muscle pain.   vitamin B-12 (CYANOCOBALAMIN) 1000 MCG tablet Take 1,000 mcg by mouth daily.      Review  of Systems      All other systems reviewed and are otherwise negative except as noted above.  Physical Exam    VS:  BP 108/60   Pulse 86   Ht 5\' 5"  (1.651 m)   Wt 177 lb (80.3 kg)   SpO2 96%   BMI 29.45 kg/m  , BMI Body mass index is 29.45 kg/m.  Wt Readings from Last 3 Encounters:  04/17/22 177 lb (80.3 kg)  04/07/22 179 lb (81.2 kg)  04/07/22 177 lb (80.3 kg)     GEN: Well nourished, well developed, in no acute distress. HEENT: normal. Neck: Supple, no JVD, carotid bruits, or masses. Cardiac: RRR,  + systolic murmurs, rubs, or gallops. No clubbing, cyanosis, 2-3 pitting LE edema.  Radials/PT 2+ and equal bilaterally.  Respiratory:  Respirations regular and unlabored, clear to auscultation bilaterally. GI: Soft, nontender, nondistended. MS: No deformity or atrophy. Skin: Warm and dry, no rash. Neuro:  Strength and sensation are intact. Psych: Normal affect.  Assessment & Plan    Chronic diastolic heart failure/chronic LE edema/chronic DOE -She continues to struggle with shortness of breath and lower extremity edema -We discussed increasing her Lasix 40 mg to twice a day for a few days and then return to her usual 40 mg daily dose -She will see her primary care on Monday and we have encouraged a BMP -We discussed a low-sodium diet and keep feet elevated when able -We discussed use of lower extremity compression for edema  Fatigue -Likely multifactorial -She does have labs scheduled for Monday and they checked her iron and sometimes she requires IV iron infusions -She leads a sedentary lifestyle due to chronic knee pain (she tried injections but they would work for about a  month and she is not a surgical candidate)  Knee pain  -This significantly limits her activity -She has tried to do knee injections but they only last for about a month -She is not a surgical candidate -She uses a walker to get around at baseline        Disposition: Follow up 6 months with Fransico Him, MD or APP.  Signed, Elgie Collard, PA-C 04/17/2022, 4:01 PM [facility]

## 2022-04-18 ENCOUNTER — Other Ambulatory Visit: Payer: Self-pay | Admitting: Family Medicine

## 2022-04-18 DIAGNOSIS — M549 Dorsalgia, unspecified: Secondary | ICD-10-CM

## 2022-04-21 ENCOUNTER — Encounter: Payer: Self-pay | Admitting: Family

## 2022-04-21 ENCOUNTER — Inpatient Hospital Stay: Payer: Medicare HMO | Attending: Family Medicine

## 2022-04-21 ENCOUNTER — Inpatient Hospital Stay: Payer: Medicare HMO | Admitting: Medical Oncology

## 2022-04-21 ENCOUNTER — Inpatient Hospital Stay: Payer: Medicare HMO

## 2022-04-21 ENCOUNTER — Encounter: Payer: Self-pay | Admitting: Medical Oncology

## 2022-04-21 ENCOUNTER — Other Ambulatory Visit (HOSPITAL_BASED_OUTPATIENT_CLINIC_OR_DEPARTMENT_OTHER): Payer: Self-pay

## 2022-04-21 ENCOUNTER — Other Ambulatory Visit: Payer: Self-pay

## 2022-04-21 VITALS — BP 123/52 | HR 88 | Temp 99.0°F | Resp 20 | Ht 65.0 in | Wt 177.1 lb

## 2022-04-21 DIAGNOSIS — D510 Vitamin B12 deficiency anemia due to intrinsic factor deficiency: Secondary | ICD-10-CM

## 2022-04-21 DIAGNOSIS — N184 Chronic kidney disease, stage 4 (severe): Secondary | ICD-10-CM

## 2022-04-21 DIAGNOSIS — D509 Iron deficiency anemia, unspecified: Secondary | ICD-10-CM

## 2022-04-21 DIAGNOSIS — Z79899 Other long term (current) drug therapy: Secondary | ICD-10-CM | POA: Insufficient documentation

## 2022-04-21 DIAGNOSIS — D631 Anemia in chronic kidney disease: Secondary | ICD-10-CM | POA: Diagnosis not present

## 2022-04-21 LAB — RETICULOCYTES
Immature Retic Fract: 13.8 % (ref 2.3–15.9)
RBC.: 2.99 MIL/uL — ABNORMAL LOW (ref 3.87–5.11)
Retic Count, Absolute: 31.1 10*3/uL (ref 19.0–186.0)
Retic Ct Pct: 1 % (ref 0.4–3.1)

## 2022-04-21 LAB — COMPREHENSIVE METABOLIC PANEL
ALT: 8 U/L (ref 0–44)
AST: 12 U/L — ABNORMAL LOW (ref 15–41)
Albumin: 4.1 g/dL (ref 3.5–5.0)
Alkaline Phosphatase: 33 U/L — ABNORMAL LOW (ref 38–126)
Anion gap: 6 (ref 5–15)
BUN: 31 mg/dL — ABNORMAL HIGH (ref 8–23)
CO2: 35 mmol/L — ABNORMAL HIGH (ref 22–32)
Calcium: 9.4 mg/dL (ref 8.9–10.3)
Chloride: 102 mmol/L (ref 98–111)
Creatinine, Ser: 1.66 mg/dL — ABNORMAL HIGH (ref 0.44–1.00)
GFR, Estimated: 31 mL/min — ABNORMAL LOW (ref >60)
Glucose, Bld: 99 mg/dL (ref 70–99)
Potassium: 4.4 mmol/L (ref 3.5–5.1)
Sodium: 143 mmol/L (ref 135–145)
Total Bilirubin: 0.4 mg/dL (ref 0.3–1.2)
Total Protein: 5.9 g/dL — ABNORMAL LOW (ref 6.5–8.1)

## 2022-04-21 LAB — CBC WITH DIFFERENTIAL (CANCER CENTER ONLY)
Abs Immature Granulocytes: 0.04 10*3/uL (ref 0.00–0.07)
Basophils Absolute: 0 10*3/uL (ref 0.0–0.1)
Basophils Relative: 0 %
Eosinophils Absolute: 0.2 10*3/uL (ref 0.0–0.5)
Eosinophils Relative: 4 %
HCT: 30.3 % — ABNORMAL LOW (ref 36.0–46.0)
Hemoglobin: 9.1 g/dL — ABNORMAL LOW (ref 12.0–15.0)
Immature Granulocytes: 1 %
Lymphocytes Relative: 25 %
Lymphs Abs: 1.2 10*3/uL (ref 0.7–4.0)
MCH: 30.6 pg (ref 26.0–34.0)
MCHC: 30 g/dL (ref 30.0–36.0)
MCV: 102 fL — ABNORMAL HIGH (ref 80.0–100.0)
Monocytes Absolute: 0.6 10*3/uL (ref 0.1–1.0)
Monocytes Relative: 12 %
Neutro Abs: 2.8 10*3/uL (ref 1.7–7.7)
Neutrophils Relative %: 58 %
Platelet Count: 99 10*3/uL — ABNORMAL LOW (ref 150–400)
RBC: 2.97 MIL/uL — ABNORMAL LOW (ref 3.87–5.11)
RDW: 15 % (ref 11.5–15.5)
WBC Count: 4.8 10*3/uL (ref 4.0–10.5)
nRBC: 0 % (ref 0.0–0.2)

## 2022-04-21 LAB — IRON AND IRON BINDING CAPACITY (CC-WL,HP ONLY)
Iron: 110 ug/dL (ref 28–170)
Saturation Ratios: 46 % — ABNORMAL HIGH (ref 10.4–31.8)
TIBC: 238 ug/dL — ABNORMAL LOW (ref 250–450)
UIBC: 128 ug/dL — ABNORMAL LOW (ref 148–442)

## 2022-04-21 LAB — FERRITIN: Ferritin: 403 ng/mL — ABNORMAL HIGH (ref 11–307)

## 2022-04-21 MED ORDER — SHINGRIX 50 MCG/0.5ML IM SUSR
INTRAMUSCULAR | 0 refills | Status: DC
  Filled 2022-04-21: qty 1, 1d supply, fill #0

## 2022-04-21 MED ORDER — EPOETIN ALFA-EPBX 40000 UNIT/ML IJ SOLN
40000.0000 [IU] | Freq: Once | INTRAMUSCULAR | Status: AC
  Administered 2022-04-21: 40000 [IU] via SUBCUTANEOUS
  Filled 2022-04-21: qty 1

## 2022-04-21 NOTE — Progress Notes (Signed)
Hematology and Oncology Follow Up Visit  [name] [id number] 09-27-1940 82 y.o. 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 She reports that she has been feeling well. No concerns at this time or recent changes to history other than recent ABX use for cat bite.    No chest pain, SOB, jaw pain, dizziness, nausea.  She has not noted any blood loss. No petechiae.   She reports that her chronic SOB is stable.  Continues 2L supplemental O2.  No fever, chills, n/v, cough, rash, chest pain, palpitations, abdominal pain or change sin bowel or bladder habits.  No swelling, tenderness, numbness or tingling in her extremities.  No falls or syncope to report.  Appetite and hydration are good.   Wt Readings from Last 3 Encounters:  04/21/22 177 lb 1.3 oz (80.3 kg)  04/17/22 177 lb (80.3 kg)  04/07/22 179 lb (81.2 kg)   ECOG Performance Status: 1 - Symptomatic but completely ambulatory  Medications:  Allergies as of 04/21/2022       Reactions   Montelukast Sodium Palpitations, Other (See Comments)   REACTION: HEART PALPITATIONS, CHEST PAIN.   Sulfa Antibiotics Other (See Comments)   CHEST PAIN   Sulfonamide Derivatives Other (See Comments)   CHEST PAIN   Ace Inhibitors Other (See Comments)   PT. STATED UNKNOWN REACTION   Indomethacin Other (See Comments)   PT. STATED UNKNOWN REACTION        Medication List        Accurate as of April 21, 2022 12:12 PM. If you have any questions, ask your nurse or doctor.          STOP taking these medications    bacitracin ointment Stopped by: Hughie Closs, PA-C   cefdinir 300 MG capsule Commonly known as: OMNICEF Stopped by: Hughie Closs, PA-C   mupirocin ointment 2 % Commonly known  as: BACTROBAN Stopped by: Hughie Closs, PA-C   predniSONE 20 MG tablet Commonly known as: DELTASONE Stopped by: Hughie Closs, PA-C       TAKE these medications    Accu-Chek Aviva Soln USE AS DIRECTED WITH GLUCOMETER   Accu-Chek Guide test strip Generic drug: glucose blood CHECK BLOOD SUGAR ONE TIME WEEKLY   Accu-Chek Guide w/Device Kit USE AS DIRECTED TO CHECK BLOOD SUGAR   Accu-Chek Softclix Lancets lancets CHECK BLOOD SUGAR ONE TIME WEEKLY   albuterol 108 (90 Base) MCG/ACT inhaler Commonly known as: VENTOLIN HFA Inhale 2 puffs into the lungs every 4 (four) hours as needed for wheezing or shortness of breath.   allopurinol 100 MG tablet Commonly known as: ZYLOPRIM TAKE 2 TABLETS ONE TIME DAILY   b complex vitamins tablet Take 1 tablet by mouth daily.   blood glucose meter kit and supplies Check blood sugars once weekly.   cyanocobalamin 1000 MCG tablet Commonly known as: VITAMIN B12 Take 1,000 mcg by mouth daily.   docusate sodium 100 MG capsule Commonly known as: COLACE Take 1 capsule (100 mg total) by mouth 2 (two) times daily.   DropSafe Alcohol Prep 70 % Pads USE AS DIRECTED  BEFORE  CHECKING BLOOD SUGAR   fluticasone 50 MCG/ACT nasal spray Commonly known as: FLONASE Place 2 sprays into both  nostrils daily. What changed:  when to take this reasons to take this   furosemide 40 MG tablet Commonly known as: LASIX TAKE 1 TABLET DAILY AND A SECOND TABLET DAILY AS NEEDED FOR INCREASED EDEMA OR WEIGHT GAIN GREATER THAN 3 POUNDS IN 24 HOURS   ipratropium-albuterol 0.5-2.5 (3) MG/3ML Soln Commonly known as: DUONEB Take 3 mLs by nebulization every 4 (four) hours as needed.   loratadine 10 MG tablet Commonly known as: CLARITIN Take 1 tablet (10 mg total) by mouth at bedtime. What changed:  when to take this reasons to take this   metoprolol succinate 50 MG 24 hr tablet Commonly known as: TOPROL-XL Take 1 tablet (50 mg total) by mouth  daily. Take with or immediately following a meal.   OXYGEN Inhale 2 L into the lungs continuous.   pantoprazole 40 MG tablet Commonly known as: PROTONIX Take 1 tablet (40 mg total) by mouth daily.   polyethylene glycol 17 g packet Commonly known as: MIRALAX / GLYCOLAX Take 17 g by mouth daily.   pravastatin 40 MG tablet Commonly known as: PRAVACHOL TAKE 1 TABLET EVERY DAY   PROBIOTIC PO Take 1 tablet by mouth daily.   Prolia 60 MG/ML Sosy injection Generic drug: denosumab Bring to doctors office.   Symbicort 160-4.5 MCG/ACT inhaler Generic drug: budesonide-formoterol INHALE 2 PUFFS TWICE DAILY IN THE MORNING AND AT BEDTIME   traMADol 50 MG tablet Commonly known as: ULTRAM TAKE 1 TABLET BY MOUTH EVERY 6 HOURS AS NEEDED   trolamine salicylate 10 % cream Commonly known as: ASPERCREME Apply 1 application topically as needed for muscle pain.        Allergies:  Allergies  Allergen Reactions   Montelukast Sodium Palpitations and Other (See Comments)    REACTION: HEART PALPITATIONS, CHEST PAIN.   Sulfa Antibiotics Other (See Comments)    CHEST PAIN   Sulfonamide Derivatives Other (See Comments)    CHEST PAIN   Ace Inhibitors Other (See Comments)    PT. STATED UNKNOWN REACTION   Indomethacin Other (See Comments)    PT. STATED UNKNOWN REACTION    Past Medical History, Surgical history, Social history, and Family History were reviewed and updated.  Review of Systems: All other 10 point review of systems is negative.   Physical Exam:  height is 5\' 5"  (1.651 m) and weight is 177 lb 1.3 oz (80.3 kg). Her oral temperature is 99 F (37.2 C). Her blood pressure is 123/52 (abnormal) and her pulse is 88. Her respiration is 20 and oxygen saturation is 100%.   Wt Readings from Last 3 Encounters:  04/21/22 177 lb 1.3 oz (80.3 kg)  04/17/22 177 lb (80.3 kg)  04/07/22 179 lb (81.2 kg)    Ocular: Sclerae unicteric, pupils equal, round and reactive to  light Ear-nose-throat: Oropharynx clear, dentition fair Lymphatic: No cervical or supraclavicular adenopathy Lungs no rales or rhonchi, good excursion bilaterally Heart regular rate and rhythm, no murmur appreciated Abd soft, nontender, positive bowel sounds MSK no focal spinal tenderness, no joint edema Neuro: non-focal, well-oriented, appropriate affect  Lab Results  Component Value Date   WBC 4.8 04/21/2022   HGB 9.1 (L) 04/21/2022   HCT 30.3 (L) 04/21/2022   MCV 102.0 (H) 04/21/2022   PLT 99 (L) 04/21/2022   Lab Results  Component Value Date   FERRITIN 563 (H) 01/29/2022   IRON 97 01/29/2022   TIBC 218 (L) 01/29/2022   UIBC 121 (L) 01/29/2022   IRONPCTSAT 44 (H) 01/29/2022   Lab  Results  Component Value Date   RETICCTPCT 1.0 04/21/2022   RBC 2.99 (L) 04/21/2022   No results found for: "KPAFRELGTCHN", "LAMBDASER", "KAPLAMBRATIO" No results found for: "IGGSERUM", "IGA", "IGMSERUM" No results found for: "TOTALPROTELP", "ALBUMINELP", "A1GS", "A2GS", "BETS", "BETA2SER", "GAMS", "MSPIKE", "SPEI"   Chemistry      Component Value Date/Time   NA 143 04/21/2022 1108   NA 143 01/27/2020 1535   K 4.4 04/21/2022 1108   CL 102 04/21/2022 1108   CO2 35 (H) 04/21/2022 1108   BUN 31 (H) 04/21/2022 1108   BUN 40 (H) 01/27/2020 1535   CREATININE 1.66 (H) 04/21/2022 1108   CREATININE 1.40 (H) 03/03/2022 1052   CREATININE 1.36 (H) 08/23/2013 1134      Component Value Date/Time   CALCIUM 9.4 04/21/2022 1108   ALKPHOS 33 (L) 04/21/2022 1108   AST 12 (L) 04/21/2022 1108   AST 11 (L) 03/03/2022 1052   ALT 8 04/21/2022 1108   ALT 6 03/03/2022 1052   BILITOT 0.4 04/21/2022 1108   BILITOT 0.4 03/03/2022 1052      Encounter Diagnoses  Name Primary?   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 Bossom is a very  pleasant 82 yo caucasian female with multifactorial anemia. Anemia is chronic in nature.  ESA to be given today for Hgb 9.1 Hgb slightly lower today and creatinine is slightly elevated. Likely related and likely secondary to her recent ceulluitis and ABX use. She has now finished her ABX and I expect things to improve. Continue nephrology and pcp monitoring of her CKD.  Iron studies are pending but have been elevated in the past- will supplement if needed Lab check every 3 weeks, follow-up in 3 months.    Hughie Closs, PA-C 4/1/202412:12 PM

## 2022-04-21 NOTE — Patient Instructions (Signed)

## 2022-04-22 ENCOUNTER — Other Ambulatory Visit: Payer: Self-pay | Admitting: Family Medicine

## 2022-04-23 ENCOUNTER — Other Ambulatory Visit (HOSPITAL_BASED_OUTPATIENT_CLINIC_OR_DEPARTMENT_OTHER): Payer: Self-pay

## 2022-04-30 ENCOUNTER — Other Ambulatory Visit: Payer: Self-pay | Admitting: Cardiology

## 2022-05-10 ENCOUNTER — Other Ambulatory Visit: Payer: Self-pay | Admitting: Cardiology

## 2022-05-12 ENCOUNTER — Inpatient Hospital Stay: Payer: Medicare HMO

## 2022-05-12 ENCOUNTER — Telehealth: Payer: Self-pay | Admitting: Family Medicine

## 2022-05-12 VITALS — BP 137/65 | HR 76 | Temp 98.1°F | Resp 20

## 2022-05-12 DIAGNOSIS — D631 Anemia in chronic kidney disease: Secondary | ICD-10-CM

## 2022-05-12 DIAGNOSIS — D510 Vitamin B12 deficiency anemia due to intrinsic factor deficiency: Secondary | ICD-10-CM | POA: Diagnosis not present

## 2022-05-12 DIAGNOSIS — N184 Chronic kidney disease, stage 4 (severe): Secondary | ICD-10-CM | POA: Diagnosis not present

## 2022-05-12 DIAGNOSIS — D509 Iron deficiency anemia, unspecified: Secondary | ICD-10-CM

## 2022-05-12 DIAGNOSIS — Z79899 Other long term (current) drug therapy: Secondary | ICD-10-CM | POA: Diagnosis not present

## 2022-05-12 LAB — CBC WITH DIFFERENTIAL (CANCER CENTER ONLY)
Abs Immature Granulocytes: 0.01 10*3/uL (ref 0.00–0.07)
Basophils Absolute: 0 10*3/uL (ref 0.0–0.1)
Basophils Relative: 0 %
Eosinophils Absolute: 0.3 10*3/uL (ref 0.0–0.5)
Eosinophils Relative: 4 %
HCT: 35.7 % — ABNORMAL LOW (ref 36.0–46.0)
Hemoglobin: 10.9 g/dL — ABNORMAL LOW (ref 12.0–15.0)
Immature Granulocytes: 0 %
Lymphocytes Relative: 20 %
Lymphs Abs: 1.2 10*3/uL (ref 0.7–4.0)
MCH: 31 pg (ref 26.0–34.0)
MCHC: 30.5 g/dL (ref 30.0–36.0)
MCV: 101.4 fL — ABNORMAL HIGH (ref 80.0–100.0)
Monocytes Absolute: 0.6 10*3/uL (ref 0.1–1.0)
Monocytes Relative: 11 %
Neutro Abs: 3.8 10*3/uL (ref 1.7–7.7)
Neutrophils Relative %: 65 %
Platelet Count: 132 10*3/uL — ABNORMAL LOW (ref 150–400)
RBC: 3.52 MIL/uL — ABNORMAL LOW (ref 3.87–5.11)
RDW: 14.9 % (ref 11.5–15.5)
WBC Count: 5.9 10*3/uL (ref 4.0–10.5)
nRBC: 0 % (ref 0.0–0.2)

## 2022-05-12 LAB — CMP (CANCER CENTER ONLY)
ALT: 8 U/L (ref 0–44)
AST: 14 U/L — ABNORMAL LOW (ref 15–41)
Albumin: 4.3 g/dL (ref 3.5–5.0)
Alkaline Phosphatase: 42 U/L (ref 38–126)
Anion gap: 7 (ref 5–15)
BUN: 36 mg/dL — ABNORMAL HIGH (ref 8–23)
CO2: 36 mmol/L — ABNORMAL HIGH (ref 22–32)
Calcium: 9.2 mg/dL (ref 8.9–10.3)
Chloride: 101 mmol/L (ref 98–111)
Creatinine: 1.73 mg/dL — ABNORMAL HIGH (ref 0.44–1.00)
GFR, Estimated: 29 mL/min — ABNORMAL LOW (ref >60)
Glucose, Bld: 97 mg/dL (ref 70–99)
Potassium: 5 mmol/L (ref 3.5–5.1)
Sodium: 144 mmol/L (ref 135–145)
Total Bilirubin: 0.5 mg/dL (ref 0.3–1.2)
Total Protein: 6.8 g/dL (ref 6.5–8.1)

## 2022-05-12 MED ORDER — EPOETIN ALFA-EPBX 40000 UNIT/ML IJ SOLN
40000.0000 [IU] | Freq: Once | INTRAMUSCULAR | Status: AC
  Administered 2022-05-12: 40000 [IU] via SUBCUTANEOUS
  Filled 2022-05-12: qty 1

## 2022-05-12 NOTE — Telephone Encounter (Signed)
Will get forms completed and mailed

## 2022-05-12 NOTE — Telephone Encounter (Signed)
Pt dropped off document to be filled out by provider (Disability Parking Placard form 1 page) Pt would like document to be mailed out to home address when document ready. Document put at front office tray under providers name.

## 2022-06-02 ENCOUNTER — Inpatient Hospital Stay: Payer: Medicare HMO | Attending: Family Medicine

## 2022-06-02 ENCOUNTER — Inpatient Hospital Stay: Payer: Medicare HMO

## 2022-06-02 ENCOUNTER — Other Ambulatory Visit: Payer: Self-pay | Admitting: Family

## 2022-06-02 VITALS — BP 126/57 | HR 72 | Temp 97.6°F | Resp 20

## 2022-06-02 DIAGNOSIS — D509 Iron deficiency anemia, unspecified: Secondary | ICD-10-CM

## 2022-06-02 DIAGNOSIS — D631 Anemia in chronic kidney disease: Secondary | ICD-10-CM

## 2022-06-02 DIAGNOSIS — Z79899 Other long term (current) drug therapy: Secondary | ICD-10-CM | POA: Insufficient documentation

## 2022-06-02 DIAGNOSIS — N184 Chronic kidney disease, stage 4 (severe): Secondary | ICD-10-CM | POA: Diagnosis not present

## 2022-06-02 LAB — CMP (CANCER CENTER ONLY)
ALT: 8 U/L (ref 0–44)
AST: 12 U/L — ABNORMAL LOW (ref 15–41)
Albumin: 4.3 g/dL (ref 3.5–5.0)
Alkaline Phosphatase: 34 U/L — ABNORMAL LOW (ref 38–126)
Anion gap: 7 (ref 5–15)
BUN: 53 mg/dL — ABNORMAL HIGH (ref 8–23)
CO2: 36 mmol/L — ABNORMAL HIGH (ref 22–32)
Calcium: 11.1 mg/dL — ABNORMAL HIGH (ref 8.9–10.3)
Chloride: 98 mmol/L (ref 98–111)
Creatinine: 1.95 mg/dL — ABNORMAL HIGH (ref 0.44–1.00)
GFR, Estimated: 25 mL/min — ABNORMAL LOW (ref >60)
Glucose, Bld: 108 mg/dL — ABNORMAL HIGH (ref 70–99)
Potassium: 4.6 mmol/L (ref 3.5–5.1)
Sodium: 141 mmol/L (ref 135–145)
Total Bilirubin: 0.6 mg/dL (ref 0.3–1.2)
Total Protein: 6.8 g/dL (ref 6.5–8.1)

## 2022-06-02 LAB — CBC WITH DIFFERENTIAL (CANCER CENTER ONLY)
Abs Immature Granulocytes: 0.01 10*3/uL (ref 0.00–0.07)
Basophils Absolute: 0 10*3/uL (ref 0.0–0.1)
Basophils Relative: 1 %
Eosinophils Absolute: 0.3 10*3/uL (ref 0.0–0.5)
Eosinophils Relative: 6 %
HCT: 34.1 % — ABNORMAL LOW (ref 36.0–46.0)
Hemoglobin: 10.3 g/dL — ABNORMAL LOW (ref 12.0–15.0)
Immature Granulocytes: 0 %
Lymphocytes Relative: 18 %
Lymphs Abs: 1 10*3/uL (ref 0.7–4.0)
MCH: 30.5 pg (ref 26.0–34.0)
MCHC: 30.2 g/dL (ref 30.0–36.0)
MCV: 100.9 fL — ABNORMAL HIGH (ref 80.0–100.0)
Monocytes Absolute: 0.7 10*3/uL (ref 0.1–1.0)
Monocytes Relative: 12 %
Neutro Abs: 3.4 10*3/uL (ref 1.7–7.7)
Neutrophils Relative %: 63 %
Platelet Count: 116 10*3/uL — ABNORMAL LOW (ref 150–400)
RBC: 3.38 MIL/uL — ABNORMAL LOW (ref 3.87–5.11)
RDW: 14.6 % (ref 11.5–15.5)
WBC Count: 5.3 10*3/uL (ref 4.0–10.5)
nRBC: 0 % (ref 0.0–0.2)

## 2022-06-02 MED ORDER — EPOETIN ALFA-EPBX 40000 UNIT/ML IJ SOLN
40000.0000 [IU] | Freq: Once | INTRAMUSCULAR | Status: AC
  Administered 2022-06-02: 40000 [IU] via SUBCUTANEOUS
  Filled 2022-06-02: qty 1

## 2022-06-02 NOTE — Patient Instructions (Signed)

## 2022-06-03 ENCOUNTER — Other Ambulatory Visit: Payer: Self-pay | Admitting: Family Medicine

## 2022-06-03 DIAGNOSIS — M549 Dorsalgia, unspecified: Secondary | ICD-10-CM

## 2022-06-04 NOTE — Telephone Encounter (Signed)
Requesting: tramadol 50mg   Contract: 09/24/21 UDS: 09/24/21 Last Visit: 04/07/22 Next Visit: 06/10/22 Last Refill: 04/21/22 #90 and 0RF   Please Advise

## 2022-06-07 DIAGNOSIS — Z0100 Encounter for examination of eyes and vision without abnormal findings: Secondary | ICD-10-CM | POA: Diagnosis not present

## 2022-06-07 DIAGNOSIS — H524 Presbyopia: Secondary | ICD-10-CM | POA: Diagnosis not present

## 2022-06-07 LAB — HM DIABETES EYE EXAM

## 2022-06-09 NOTE — Assessment & Plan Note (Signed)
hgba1c acceptable, minimize simple carbs. Increase exercise as tolerated. Continue current meds 

## 2022-06-09 NOTE — Assessment & Plan Note (Signed)
Encourage heart healthy diet such as MIND or DASH diet, increase exercise, avoid trans fats, simple carbohydrates and processed foods, consider a krill or fish or flaxseed oil cap daily. Tolerating Pravastatin 

## 2022-06-09 NOTE — Assessment & Plan Note (Signed)
Supplement and monitor 

## 2022-06-09 NOTE — Assessment & Plan Note (Signed)
Well controlled, no changes to meds. Encouraged heart healthy diet such as the DASH diet and exercise as tolerated.  °

## 2022-06-09 NOTE — Assessment & Plan Note (Signed)
Hydrate and monitor 

## 2022-06-09 NOTE — Assessment & Plan Note (Signed)
Encouraged to get adequate exercise, calcium and vitamin d intake 

## 2022-06-10 ENCOUNTER — Telehealth: Payer: Self-pay

## 2022-06-10 ENCOUNTER — Ambulatory Visit (INDEPENDENT_AMBULATORY_CARE_PROVIDER_SITE_OTHER): Payer: Medicare HMO | Admitting: Family Medicine

## 2022-06-10 VITALS — BP 134/70 | HR 89 | Temp 97.5°F | Resp 16 | Ht 65.0 in | Wt 177.0 lb

## 2022-06-10 DIAGNOSIS — I10 Essential (primary) hypertension: Secondary | ICD-10-CM | POA: Diagnosis not present

## 2022-06-10 DIAGNOSIS — E1122 Type 2 diabetes mellitus with diabetic chronic kidney disease: Secondary | ICD-10-CM | POA: Diagnosis not present

## 2022-06-10 DIAGNOSIS — E559 Vitamin D deficiency, unspecified: Secondary | ICD-10-CM | POA: Diagnosis not present

## 2022-06-10 DIAGNOSIS — N184 Chronic kidney disease, stage 4 (severe): Secondary | ICD-10-CM

## 2022-06-10 DIAGNOSIS — M109 Gout, unspecified: Secondary | ICD-10-CM | POA: Diagnosis not present

## 2022-06-10 DIAGNOSIS — E782 Mixed hyperlipidemia: Secondary | ICD-10-CM | POA: Diagnosis not present

## 2022-06-10 DIAGNOSIS — M8589 Other specified disorders of bone density and structure, multiple sites: Secondary | ICD-10-CM

## 2022-06-10 MED ORDER — ACCU-CHEK GUIDE VI STRP: ORAL_STRIP | 3 refills | Status: DC

## 2022-06-10 MED ORDER — ACCU-CHEK SOFTCLIX LANCETS MISC: 3 refills | Status: DC

## 2022-06-10 NOTE — Progress Notes (Signed)
Subjective:   By signing my name below, I, [name], attest that this documentation has been prepared under the direction and in the presence of Margaret Canary, MD., 06/10/2022.   Patient ID: [name], female    DOB: 11-22-1940, 82 y.o.   MRN: [id number]  No chief complaint on file.  HPI Patient is in today for an office visit.  Diabetes Mellitus Type 2 Patient states that she likes to monitor her blood glucose and is requesting a prescription for daily lancets and test strips. She is requesting this because of a recent spell of dizziness or fatigue caused by dehydration and skipping meals. She is otherwise doing well and denies CP/palpitations/SOB/HA/fever/chills/GI or GU symptoms. Lab Results  Component Value Date   HGBA1C 5.4 01/03/2022   Vitamin D Deficiency Patient currently takes vitamin B-12 1000 IU and D 1000 IU daily which have been tolerable.  Past Medical History:  Diagnosis Date   Abnormal glucose tolerance test    Abnormal TSH 09/22/2016   ACE-inhibitor cough    Anemia 03/12/2014   Arthritis    Asthma    PFT 02/06/09 FEV1 1.41 (65%), FVC 1.92 (64), FEV1% 74, TLC 3.47 (71%), DLCO 48%, +BD   Atypical chest pain    s/p cath, Normal coronaries, Non ST elevation myocardial infarction, Rt groin pseudoaneurysm   Bacterial vaginosis 03/12/2014   Cellulitis 06/22/2016   Chronic kidney disease (CKD), stage III (moderate) (HCC) 08/04/2016   COPD (chronic obstructive pulmonary disease) (HCC)    Depression    Diastolic heart failure (HCC) 04/07/2016   DVT (deep venous thrombosis) (HCC) 1987   GERD (gastroesophageal reflux disease)    Gout    Hypercalcemia 10/15/2014   Hyperlipidemia    Hypertension    Hypoxia 10/15/2014   Macular degeneration 04/10/2015   Osteopenia 12/29/2006   Qualifier: Diagnosis of  By: Artist Pais DO, Barbette Hair    Pneumonia    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   Renal insufficiency 09/22/2016   Unspecified constipation 06/05/2013   Vitamin D  deficiency 01/01/2015    Past Surgical History:  Procedure Laterality Date   APPENDECTOMY  1951   TONSILLECTOMY  1950   TUBAL LIGATION  1968    Family History  Problem Relation Age of Onset   Asthma Sister    Hypertension Sister    Hyperlipidemia Sister    Uterine cancer Sister    Coronary artery disease Brother        x2   Arthritis Brother    Lung cancer Brother        smoker   Hypertension Sister    Arthritis Sister    Hyperlipidemia Sister    Emphysema Sister    COPD Sister        smoker   Heart disease Sister    Hyperlipidemia Sister    Hypertension Sister    Arthritis Sister    Emphysema Brother    Heart disease Brother         smoker   Heart attack Brother    Mental illness Father    Suicidality Father    Heart disease Mother    Hyperlipidemia Mother    Heart attack Mother    Epilepsy Daughter    Hypertension Daughter    Obesity Daughter    COPD Brother        smoker   Lung cancer Brother    Coronary artery disease Other    Colon polyps Sister     Social History  Socioeconomic History   Marital status: Widowed    Spouse name: Not on file   Number of children: 1   Years of education: Not on file   Highest education level: 12th grade  Occupational History   Occupation: Retired    Associate Professor: [employer]  Tobacco Use   Smoking status: Never    Passive exposure: Past   Smokeless tobacco: Never  Vaping Use   Vaping Use: Never used  Substance and Sexual Activity   Alcohol use: No   Drug use: No   Sexual activity: Never    Comment: lives alone, no dietary restrictions  Other Topics Concern   Not on file  Social History Narrative   Not on file   Social Determinants of Health   Financial Resource Strain: Low Risk  (07/25/2020)   Overall Financial Resource Strain (CARDIA)    Difficulty of Paying Living Expenses: Not hard at all  Food Insecurity: No Food Insecurity (06/09/2022)   Hunger Vital Sign    Worried About Running Out of Food in the  Last Year: Never true    Ran Out of Food in the Last Year: Never true  Transportation Needs: No Transportation Needs (06/09/2022)   PRAPARE - Administrator, Civil Service (Medical): No    Lack of Transportation (Non-Medical): No  Physical Activity: Insufficiently Active (06/09/2022)   Exercise Vital Sign    Days of Exercise per Week: 2 days    Minutes of Exercise per Session: 20 min  Stress: No Stress Concern Present (06/09/2022)   [employer] of Occupational Health - Occupational Stress Questionnaire    Feeling of Stress : Not at all  Social Connections: Moderately Isolated (06/09/2022)   Social Connection and Isolation Panel [NHANES]    Frequency of Communication with Friends and Family: More than three times a week    Frequency of Social Gatherings with Friends and Family: Once a week    Attends Religious Services: 1 to 4 times per year    Active Member of [employer] or Organizations: No    Attends Banker Meetings: Never    Marital Status: Widowed  Intimate Partner Violence: Not At Risk (07/25/2020)   Humiliation, Afraid, Rape, and Kick questionnaire    Fear of Current or Ex-Partner: No    Emotionally Abused: No    Physically Abused: No    Sexually Abused: No    Outpatient Medications Prior to Visit  Medication Sig Dispense Refill   ACCU-CHEK GUIDE test strip CHECK BLOOD SUGAR ONE TIME WEEKLY 100 strip 1   Accu-Chek Softclix Lancets lancets CHECK BLOOD SUGAR ONE TIME WEEKLY 100 each 1   albuterol (VENTOLIN HFA) 108 (90 Base) MCG/ACT inhaler Inhale 2 puffs into the lungs every 4 (four) hours as needed for wheezing or shortness of breath. 18 g 3   Alcohol Swabs (DROPSAFE ALCOHOL PREP) 70 % PADS USE AS DIRECTED  BEFORE  CHECKING BLOOD SUGAR 100 each 10   allopurinol (ZYLOPRIM) 100 MG tablet TAKE 2 TABLETS ONE TIME DAILY 180 tablet 3   b complex vitamins tablet Take 1 tablet by mouth daily.     Blood Glucose Calibration (ACCU-CHEK AVIVA) SOLN USE AS DIRECTED  WITH GLUCOMETER 1 each 0   blood glucose meter kit and supplies Check blood sugars once weekly. 1 each 0   Blood Glucose Monitoring Suppl (ACCU-CHEK GUIDE) w/Device KIT USE AS DIRECTED TO CHECK BLOOD SUGAR 1 kit 1   denosumab (PROLIA) 60 MG/ML SOSY injection Bring to doctors office.  1 mL 0   docusate sodium (COLACE) 100 MG capsule Take 1 capsule (100 mg total) by mouth 2 (two) times daily. 10 capsule 0   fluticasone (FLONASE) 50 MCG/ACT nasal spray Place 2 sprays into both nostrils daily. (Patient taking differently: Place 2 sprays into both nostrils as needed.) 48 g 1   furosemide (LASIX) 40 MG tablet TAKE 1 TAB DAILY , TAKE 2ND TAB DAILY AS NEEDED FOR INCREASED EDEMA OR WEIGHT GAIN OVER 3 LBS/24 HRS 90 tablet 3   ipratropium-albuterol (DUONEB) 0.5-2.5 (3) MG/3ML SOLN Take 3 mLs by nebulization every 4 (four) hours as needed. 360 mL 11   loratadine (CLARITIN) 10 MG tablet Take 1 tablet (10 mg total) by mouth at bedtime. (Patient taking differently: Take 10 mg by mouth at bedtime as needed for allergies.) 30 tablet 5   metoprolol succinate (TOPROL-XL) 50 MG 24 hr tablet TAKE 1 TABLET EVERY DAY. TAKE WITH OR IMMEDIATELY FOLLOWING A MEAL 90 tablet 3   OXYGEN Inhale 2 L into the lungs continuous.      pantoprazole (PROTONIX) 40 MG tablet TAKE 1 TABLET EVERY DAY 90 tablet 3   polyethylene glycol (MIRALAX / GLYCOLAX) 17 g packet Take 17 g by mouth daily. 14 each 0   pravastatin (PRAVACHOL) 40 MG tablet TAKE 1 TABLET EVERY DAY 90 tablet 3   Probiotic Product (PROBIOTIC PO) Take 1 tablet by mouth daily.      SYMBICORT 160-4.5 MCG/ACT inhaler INHALE 2 PUFFS TWICE DAILY IN THE MORNING AND AT BEDTIME 3 each 3   traMADol (ULTRAM) 50 MG tablet TAKE 1 TABLET BY MOUTH EVERY 6 HOURS AS NEEDED 90 tablet 0   trolamine salicylate (ASPERCREME) 10 % cream Apply 1 application topically as needed for muscle pain.     vitamin B-12 (CYANOCOBALAMIN) 1000 MCG tablet Take 1,000 mcg by mouth daily.      Zoster Vaccine  Adjuvanted [facility]) injection Inject into the muscle. 1 each 0   No facility-administered medications prior to visit.    Allergies  Allergen Reactions   Montelukast Sodium Palpitations and Other (See Comments)    REACTION: HEART PALPITATIONS, CHEST PAIN.   Sulfa Antibiotics Other (See Comments)    CHEST PAIN   Sulfonamide Derivatives Other (See Comments)    CHEST PAIN   Ace Inhibitors Other (See Comments)    PT. STATED UNKNOWN REACTION   Indomethacin Other (See Comments)    PT. STATED UNKNOWN REACTION    Review of Systems  Constitutional:  Negative for chills and fever.  Respiratory:  Negative for shortness of breath.   Cardiovascular:  Negative for chest pain and palpitations.  Gastrointestinal:  Negative for abdominal pain, blood in stool, constipation, diarrhea, nausea and vomiting.  Genitourinary:  Negative for dysuria, frequency, hematuria and urgency.  Skin:           Neurological:  Negative for headaches.       Objective:    Physical Exam Constitutional:      General: She is not in acute distress.    Appearance: Normal appearance. She is not ill-appearing.  HENT:     Head: Normocephalic and atraumatic.     Right Ear: External ear normal.     Left Ear: External ear normal.     Nose: Nose normal.     Mouth/Throat:     Mouth: Mucous membranes are moist.     Pharynx: Oropharynx is clear.  Eyes:     General:        Right eye:  No discharge.        Left eye: No discharge.     Extraocular Movements: Extraocular movements intact.     Conjunctiva/sclera: Conjunctivae normal.     Pupils: Pupils are equal, round, and reactive to light.  Cardiovascular:     Rate and Rhythm: Normal rate and regular rhythm.     Pulses: Normal pulses.     Heart sounds: Normal heart sounds. No murmur heard.    No gallop.  Pulmonary:     Effort: Pulmonary effort is normal. No respiratory distress.     Breath sounds: Normal breath sounds. No wheezing or rales.  Abdominal:      General: Bowel sounds are normal.     Palpations: Abdomen is soft.     Tenderness: There is no abdominal tenderness. There is no guarding.  Musculoskeletal:        General: Normal range of motion.     Cervical back: Normal range of motion.     Right lower leg: No edema.     Left lower leg: No edema.  Skin:    General: Skin is warm and dry.  Neurological:     Mental Status: She is alert and oriented to person, place, and time.  Psychiatric:        Mood and Affect: Mood normal.        Behavior: Behavior normal.        Judgment: Judgment normal.     There were no vitals taken for this visit. Wt Readings from Last 3 Encounters:  04/21/22 177 lb 1.3 oz (80.3 kg)  04/17/22 177 lb (80.3 kg)  04/07/22 179 lb (81.2 kg)    Diabetic Foot Exam - Simple   No data filed    Lab Results  Component Value Date   WBC 5.3 06/02/2022   HGB 10.3 (L) 06/02/2022   HCT 34.1 (L) 06/02/2022   PLT 116 (L) 06/02/2022   GLUCOSE 108 (H) 06/02/2022   CHOL 124 11/12/2020   TRIG 106.0 11/12/2020   HDL 50.40 11/12/2020   LDLDIRECT 73.0 06/16/2017   LDLCALC 53 11/12/2020   ALT 8 06/02/2022   AST 12 (L) 06/02/2022   NA 141 06/02/2022   K 4.6 06/02/2022   CL 98 06/02/2022   CREATININE 1.95 (H) 06/02/2022   BUN 53 (H) 06/02/2022   CO2 36 (H) 06/02/2022   TSH 3.55 09/24/2021   INR 1.0 09/12/2006   HGBA1C 5.4 01/03/2022   MICROALBUR <0.7 01/03/2022    Lab Results  Component Value Date   TSH 3.55 09/24/2021   Lab Results  Component Value Date   WBC 5.3 06/02/2022   HGB 10.3 (L) 06/02/2022   HCT 34.1 (L) 06/02/2022   MCV 100.9 (H) 06/02/2022   PLT 116 (L) 06/02/2022   Lab Results  Component Value Date   NA 141 06/02/2022   K 4.6 06/02/2022   CO2 36 (H) 06/02/2022   GLUCOSE 108 (H) 06/02/2022   BUN 53 (H) 06/02/2022   CREATININE 1.95 (H) 06/02/2022   BILITOT 0.6 06/02/2022   ALKPHOS 34 (L) 06/02/2022   AST 12 (L) 06/02/2022   ALT 8 06/02/2022   PROT 6.8 06/02/2022   ALBUMIN 4.3  06/02/2022   CALCIUM 11.1 (H) 06/02/2022   ANIONGAP 7 06/02/2022   GFR 27.14 (L) 01/03/2022   Lab Results  Component Value Date   CHOL 124 11/12/2020   Lab Results  Component Value Date   HDL 50.40 11/12/2020   Lab Results  Component Value  Date   LDLCALC 53 11/12/2020   Lab Results  Component Value Date   TRIG 106.0 11/12/2020   Lab Results  Component Value Date   CHOLHDL 2 11/12/2020   Lab Results  Component Value Date   HGBA1C 5.4 01/03/2022      Assessment & Plan:  Diabetes Mellitus Type 2: Accu-Chek Softclix lancets and test strips refilled today.  Healthy Lifestyle: Encouraged 6-8 hours of sleep, heart healthy diet, 60-80 oz of non-alcohol/non-caffeinated fluids, and 4000-8000 steps daily.  Stage 4 Chronic Kidney Disease: This is controlled and monitored by patient's nephrologist, Dr. Terrial Rhodes.   Vitamin D Deficiency: This is controlled. Patient currently takes supplemental vitamins B-12 1000 IU and D 1000 IU daily.  Problem List Items Addressed This Visit       Cardiovascular and Mediastinum   Essential hypertension     Endocrine   Diabetes type 2, controlled (HCC) - Primary     Musculoskeletal and Integument   Osteopenia     Genitourinary   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HCC) (Chronic)     Other   Hyperlipidemia, mixed   Gout   Vitamin D deficiency   No orders of the defined types were placed in this encounter.  I, [name], personally preformed the services described in this documentation.  All medical record entries made by the scribe were at my direction and in my presence.  I have reviewed the chart and discharge instructions (if applicable) and agree that the record reflects my personal performance and is accurate and complete. 06/10/2022  I,Mohammed Iqbal,acting as a scribe for Danise Edge, MD.,have documented all relevant documentation on the behalf of Danise Edge, MD,as directed by  Danise Edge, MD while in the presence  of Danise Edge, MD.  [name]

## 2022-06-10 NOTE — Patient Instructions (Signed)
Iron-Rich Diet ? ?Iron is a mineral that helps your body produce hemoglobin. Hemoglobin is a protein in red blood cells that carries oxygen to your body's tissues. Eating too little iron may cause you to feel weak and tired, and it can increase your risk of infection. Iron is naturally found in many foods, and many foods have iron added to them (are iron-fortified). ?You may need to follow an iron-rich diet if you do not have enough iron in your body due to certain medical conditions. The amount of iron that you need each day depends on your age, your sex, and any medical conditions you have. Follow instructions from your health care provider or a dietitian about how much iron you should eat each day. ?What are tips for following this plan? ?Reading food labels ?Check food labels to see how many milligrams (mg) of iron are in each serving. ?Cooking ?Cook foods in pots and pans that are made from iron. ?Take these steps to make it easier for your body to absorb iron from certain foods: ?Soak beans overnight before cooking. ?Soak whole grains overnight and drain them before using. ?Ferment flours before baking, such as by using yeast in bread dough. ?Meal planning ?When you eat foods that contain iron, you should eat them with foods that are high in vitamin C. These include oranges, peppers, tomatoes, potatoes, and mangoes. Vitamin C helps your body absorb iron. ?Certain foods and drinks prevent your body from absorbing iron properly. Avoid eating these foods in the same meal as iron-rich foods or with iron supplements. These foods include: ?Coffee, black tea, and red wine. ?Milk, dairy products, and foods that are high in calcium. ?Beans and soybeans. ?Whole grains. ?General information ?Take iron supplements only as told by your health care provider. An overdose of iron can be life-threatening. If you were prescribed iron supplements, take them with orange juice or a vitamin C supplement. ?When you eat  iron-fortified foods or take an iron supplement, you should also eat foods that naturally contain iron, such as meat, poultry, and fish. Eating naturally iron-rich foods helps your body absorb the iron that is added to other foods or contained in a supplement. ?Iron from animal sources is better absorbed than iron from plant sources. ?What foods should I eat? ?Fruits ?Prunes. Raisins. ?Eat fruits high in vitamin C, such as oranges, grapefruits, and strawberries, with iron-rich foods. ?Vegetables ?Spinach (cooked). Green peas. Broccoli. Fermented vegetables. ?Eat vegetables high in vitamin C, such as leafy greens, potatoes, bell peppers, and tomatoes, with iron-rich foods. ?Grains ?Iron-fortified breakfast cereal. Iron-fortified whole-wheat bread. Enriched rice. Sprouted grains. ?Meats and other proteins ?Beef liver. Beef. Turkey. Chicken. Oysters. Shrimp. Tuna. Sardines. Chickpeas. Nuts. Tofu. Pumpkin seeds. ?Beverages ?Tomato juice. Fresh orange juice. Prune juice. Hibiscus tea. Iron-fortified instant breakfast shakes. ?Sweets and desserts ?Blackstrap molasses. ?Seasonings and condiments ?Tahini. Fermented soy sauce. ?Other foods ?Wheat germ. ?The items listed above may not be a complete list of recommended foods and beverages. Contact a dietitian for more information. ?What foods should I limit? ?These are foods that should be limited while eating iron-rich foods as they can reduce the absorption of iron in your body. ?Grains ?Whole grains. Bran cereal. Bran flour. ?Meats and other proteins ?Soybeans. Products made from soy protein. Black beans. Lentils. Mung beans. Split peas. ?Dairy ?Milk. Cream. Cheese. Yogurt. Cottage cheese. ?Beverages ?Coffee. Black tea. Red wine. ?Sweets and desserts ?Cocoa. Chocolate. Ice cream. ?Seasonings and condiments ?Basil. Oregano. Large amounts of parsley. ?The items listed   above may not be a complete list of foods and beverages you should limit. Contact a dietitian for more  information. ?Summary ?Iron is a mineral that helps your body produce hemoglobin. Hemoglobin is a protein in red blood cells that carries oxygen to your body's tissues. ?Iron is naturally found in many foods, and many foods have iron added to them (are iron-fortified). ?When you eat foods that contain iron, you should eat them with foods that are high in vitamin C. Vitamin C helps your body absorb iron. ?Certain foods and drinks prevent your body from absorbing iron properly, such as whole grains and dairy products. You should avoid eating these foods in the same meal as iron-rich foods or with iron supplements. ?This information is not intended to replace advice given to you by your health care provider. Make sure you discuss any questions you have with your health care provider. ?Document Revised: 12/19/2019 Document Reviewed: 12/19/2019 ?Elsevier Patient Education ? 2023 Elsevier Inc. ? ?

## 2022-06-10 NOTE — Telephone Encounter (Signed)
Prolia VOB initiated via [email or web address]   Last Prolia inj: 02/12/22 Next Prolia inj DUE: 08/12/22

## 2022-06-12 ENCOUNTER — Other Ambulatory Visit (HOSPITAL_COMMUNITY): Payer: Self-pay

## 2022-06-12 NOTE — Telephone Encounter (Signed)
Pharmacy Patient Advocate Encounter    PA for Prolia 60mg  submitted on 06/12/22 to (ins) [employer] via [employer] or [facility]) confirmation #  [id number] Status is pending

## 2022-06-13 ENCOUNTER — Other Ambulatory Visit (HOSPITAL_COMMUNITY): Payer: Self-pay

## 2022-06-13 NOTE — Telephone Encounter (Signed)
Pt ready for scheduling for PROLIA on or after : 08/12/22  Out-of-pocket cost due at time of visit: $327  Primary: [employer] Prolia co-insurance: 20% Admin fee co-insurance: 20%  Secondary: --- Prolia co-insurance:  Admin fee co-insurance:   Medical Benefit Details: Date Benefits were checked: 06/10/22 Deductible: NO/ Coinsurance: 20%/ Admin Fee: 20%  Prior Auth: APPROVED PA# [id number]  Expiration Date: 01/20/2023   Pharmacy benefit: Copay $--- (LAST FILLD 02-05-22 NEXT FILL 07-05-22) If patient wants fill through the pharmacy benefit please send prescription to:  --- , and include estimated need by date in rx notes. Pharmacy will ship medication directly to the office.  Patient NOT eligible for Prolia Copay Card. Copay Card can make patient's cost as little as $25. Link to apply: [email or web address]  ** This summary of benefits is an estimation of the patient's out-of-pocket cost. Exact cost may very based on individual plan coverage.

## 2022-06-13 NOTE — Telephone Encounter (Signed)
Patient Advocate Encounter  Prior Authorization for Prolia 60mg  has been approved with [employer].    PA# [id number] Effective dates: 06/05/21 through 01/20/23  Per [facility] test claim, LAST FILLD 02-05-22 NEXT FILL 07-05-22

## 2022-06-17 NOTE — Telephone Encounter (Signed)
Left message on machine to call back to schedule Prolia.  Pt is due on or around 08/14/22.  May be cheaper for pt to get at pharmacy.

## 2022-06-23 ENCOUNTER — Other Ambulatory Visit: Payer: Self-pay | Admitting: Family

## 2022-06-23 ENCOUNTER — Inpatient Hospital Stay: Payer: Medicare HMO | Attending: Family

## 2022-06-23 ENCOUNTER — Inpatient Hospital Stay: Payer: Medicare HMO

## 2022-06-23 VITALS — BP 133/57 | HR 81 | Temp 98.3°F | Resp 18

## 2022-06-23 DIAGNOSIS — N184 Chronic kidney disease, stage 4 (severe): Secondary | ICD-10-CM | POA: Diagnosis not present

## 2022-06-23 DIAGNOSIS — D631 Anemia in chronic kidney disease: Secondary | ICD-10-CM

## 2022-06-23 DIAGNOSIS — D509 Iron deficiency anemia, unspecified: Secondary | ICD-10-CM

## 2022-06-23 DIAGNOSIS — Z79899 Other long term (current) drug therapy: Secondary | ICD-10-CM | POA: Diagnosis not present

## 2022-06-23 LAB — RETICULOCYTES
Immature Retic Fract: 6.6 % (ref 2.3–15.9)
RBC.: 3.55 MIL/uL — ABNORMAL LOW (ref 3.87–5.11)
Retic Count, Absolute: 31.2 10*3/uL (ref 19.0–186.0)
Retic Ct Pct: 0.9 % (ref 0.4–3.1)

## 2022-06-23 LAB — CBC WITH DIFFERENTIAL (CANCER CENTER ONLY)
Abs Immature Granulocytes: 0.03 10*3/uL (ref 0.00–0.07)
Basophils Absolute: 0 10*3/uL (ref 0.0–0.1)
Basophils Relative: 1 %
Eosinophils Absolute: 0.3 10*3/uL (ref 0.0–0.5)
Eosinophils Relative: 7 %
HCT: 35.9 % — ABNORMAL LOW (ref 36.0–46.0)
Hemoglobin: 10.7 g/dL — ABNORMAL LOW (ref 12.0–15.0)
Immature Granulocytes: 1 %
Lymphocytes Relative: 18 %
Lymphs Abs: 0.8 10*3/uL (ref 0.7–4.0)
MCH: 30.1 pg (ref 26.0–34.0)
MCHC: 29.8 g/dL — ABNORMAL LOW (ref 30.0–36.0)
MCV: 100.8 fL — ABNORMAL HIGH (ref 80.0–100.0)
Monocytes Absolute: 0.6 10*3/uL (ref 0.1–1.0)
Monocytes Relative: 12 %
Neutro Abs: 3 10*3/uL (ref 1.7–7.7)
Neutrophils Relative %: 61 %
Platelet Count: 116 10*3/uL — ABNORMAL LOW (ref 150–400)
RBC: 3.56 MIL/uL — ABNORMAL LOW (ref 3.87–5.11)
RDW: 14.5 % (ref 11.5–15.5)
WBC Count: 4.8 10*3/uL (ref 4.0–10.5)
nRBC: 0 % (ref 0.0–0.2)

## 2022-06-23 LAB — IRON AND IRON BINDING CAPACITY (CC-WL,HP ONLY)
Iron: 92 ug/dL (ref 28–170)
Saturation Ratios: 37 % — ABNORMAL HIGH (ref 10.4–31.8)
TIBC: 251 ug/dL (ref 250–450)
UIBC: 159 ug/dL (ref 148–442)

## 2022-06-23 LAB — CMP (CANCER CENTER ONLY)
ALT: 7 U/L (ref 0–44)
AST: 11 U/L — ABNORMAL LOW (ref 15–41)
Albumin: 4.3 g/dL (ref 3.5–5.0)
Alkaline Phosphatase: 37 U/L — ABNORMAL LOW (ref 38–126)
Anion gap: 4 — ABNORMAL LOW (ref 5–15)
BUN: 38 mg/dL — ABNORMAL HIGH (ref 8–23)
CO2: 36 mmol/L — ABNORMAL HIGH (ref 22–32)
Calcium: 10.2 mg/dL (ref 8.9–10.3)
Chloride: 102 mmol/L (ref 98–111)
Creatinine: 1.6 mg/dL — ABNORMAL HIGH (ref 0.44–1.00)
GFR, Estimated: 32 mL/min — ABNORMAL LOW (ref >60)
Glucose, Bld: 123 mg/dL — ABNORMAL HIGH (ref 70–99)
Potassium: 4.5 mmol/L (ref 3.5–5.1)
Sodium: 142 mmol/L (ref 135–145)
Total Bilirubin: 0.5 mg/dL (ref 0.3–1.2)
Total Protein: 6.7 g/dL (ref 6.5–8.1)

## 2022-06-23 LAB — FERRITIN: Ferritin: 310 ng/mL — ABNORMAL HIGH (ref 11–307)

## 2022-06-23 MED ORDER — EPOETIN ALFA-EPBX 40000 UNIT/ML IJ SOLN
40000.0000 [IU] | Freq: Once | INTRAMUSCULAR | Status: AC
  Administered 2022-06-23: 40000 [IU] via SUBCUTANEOUS
  Filled 2022-06-23: qty 1

## 2022-06-23 NOTE — Patient Instructions (Signed)

## 2022-06-24 NOTE — Telephone Encounter (Signed)
Left message on machine to call back to schedule Prolia.  Pt is due on or around 08/14/22.  May be cheaper for pt to get at pharmacy.  Please let me know if she would like to get from pharmacy.

## 2022-06-27 ENCOUNTER — Encounter: Payer: Self-pay | Admitting: Surgical

## 2022-06-27 ENCOUNTER — Telehealth: Payer: Self-pay

## 2022-06-27 ENCOUNTER — Other Ambulatory Visit (INDEPENDENT_AMBULATORY_CARE_PROVIDER_SITE_OTHER): Payer: Medicare HMO

## 2022-06-27 ENCOUNTER — Ambulatory Visit: Payer: Medicare HMO | Admitting: Surgical

## 2022-06-27 DIAGNOSIS — M25561 Pain in right knee: Secondary | ICD-10-CM

## 2022-06-27 DIAGNOSIS — M1711 Unilateral primary osteoarthritis, right knee: Secondary | ICD-10-CM

## 2022-06-27 MED ORDER — METHYLPREDNISOLONE ACETATE 40 MG/ML IJ SUSP
40.0000 mg | INTRAMUSCULAR | Status: AC | PRN
Start: 2022-06-27 — End: 2022-06-27
  Administered 2022-06-27: 40 mg via INTRA_ARTICULAR

## 2022-06-27 MED ORDER — LIDOCAINE HCL 1 % IJ SOLN
5.0000 mL | INTRAMUSCULAR | Status: AC | PRN
Start: 2022-06-27 — End: 2022-06-27
  Administered 2022-06-27: 5 mL

## 2022-06-27 MED ORDER — BUPIVACAINE HCL 0.25 % IJ SOLN
4.0000 mL | INTRAMUSCULAR | Status: AC | PRN
Start: 2022-06-27 — End: 2022-06-27
  Administered 2022-06-27: 4 mL via INTRA_ARTICULAR

## 2022-06-27 NOTE — Progress Notes (Signed)
Office Visit Note   Patient: [name]           Date of Birth: 03/12/1940           MRN: [id number] Visit Date: 06/27/2022 Requested by: [name], MD [address],  [place] [address] PCP: [name], MD  Subjective: Chief Complaint  Patient presents with   Right Knee - Pain    HPI: [name] is a 82 y.o. female who presents to the office reporting right knee pain.  Patient has history of right knee osteoarthritis.  She has increased pain since Wednesday that she localizes to the anterior aspect of the knee.  No history of recent injury.  Pain radiates down to the mid shin.  No fevers or chills.  She has history of prior knee injection that gave her about a month of relief with 75% relief of her symptoms.  She does note stiffness and swelling.  Taking Tylenol and tramadol occasionally to help with pain.  She feels this is limiting her walking.  No history of prior knee surgery.  Does have some popping and mechanical symptoms.  Pain will wake her up at night.  No hip pain, groin pain, radicular pain..                ROS: All systems reviewed are negative as they relate to the chief complaint within the history of present illness.  Patient denies fevers or chills.  Assessment & Plan: Visit Diagnoses:  1. Arthritis of right knee     Plan: Patient is an 82 year old female who presents for evaluation of right knee pain.  She has history of right knee osteoarthritis.  Primarily in the patellofemoral compartment.  She would like to proceed with right knee injection today after having increased pain over the last several days.  8 cc of nonpurulent synovial fluid was aspirated prior to injection.  Tolerated procedure well without problem or complication.  Will plan to preapproved her for right knee gel injection and follow-up afterward for injection administration.  Follow-Up Instructions: No follow-ups on file.   Orders:  Orders Placed This Encounter   Procedures   XR KNEE 3 VIEW RIGHT   No orders of the defined types were placed in this encounter.     Procedures: Large Joint Inj: R knee on 06/27/2022 2:17 PM Indications: diagnostic evaluation, joint swelling and pain Details: 18 G 1.5 in needle, superolateral approach  Arthrogram: No  Medications: 5 mL lidocaine 1 %; 40 mg methylPREDNISolone acetate 40 MG/ML; 4 mL bupivacaine 0.25 % Aspirate: 8 mL Outcome: tolerated well, no immediate complications Procedure, treatment alternatives, risks and benefits explained, specific risks discussed. Consent was given by the patient. Immediately prior to procedure a time out was called to verify the correct patient, procedure, equipment, support staff and site/side marked as required. Patient was prepped and draped in the usual sterile fashion.       Clinical Data: No additional findings.  Objective: Vital Signs: There were no vitals taken for this visit.  Physical Exam:  Constitutional: Patient appears well-developed HEENT:  Head: Normocephalic Eyes:EOM are normal Neck: Normal range of motion Cardiovascular: Normal rate Pulmonary/chest: Effort normal Neurologic: Patient is alert Skin: Skin is warm Psychiatric: Patient has normal mood and affect  Ortho Exam: Ortho exam demonstrates right knee with 10 degrees extension and about 50 degrees of knee flexion.  Small effusion noted.  No calf tenderness.  Negative Homans' sign.  Tenderness over the medial and lateral joint lines.  No tenderness over the patellar tendon, quadricep tendon, patella.  She is able to perform straight leg raise without extensor lag.  Good quad strength.  No pain with hip range of motion.  No cellulitis or skin changes noted around the knee.  Specialty Comments:  No specialty comments available.  Imaging: No results found.   PMFS History: Patient Active Problem List   Diagnosis Date Noted   Cat bite of hand, right, subsequent encounter 04/07/2022    Sore in nose 01/03/2022   Chest pain 09/25/2021   Adjustment disorder 06/10/2021   Right anterior knee pain 02/25/2021   Tremor 02/25/2021   Skin lesion of right ear 11/12/2020   Myalgia 11/12/2020   Edema 07/27/2020   Grief 06/30/2020   IDA (iron deficiency anemia) 05/09/2020   Physical deconditioning 02/26/2020   Chronic asthma, mild persistent, uncomplicated 02/23/2018   High serum vitamin B12 11/20/2017   Subarachnoid hemorrhage (HCC) 11/05/2017   Intracranial hemorrhage (HCC) 11/04/2017   Thrombocytopenia (HCC) 10/16/2017   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   Vitamin D deficiency 06/16/2017   Hypernatremia 12/25/2016   Nonintractable headache 10/26/2016   Leukocytes in urine 10/26/2016   Peripheral arterial disease (HCC) 10/01/2016   Abnormal TSH 09/22/2016   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/2016   Heart murmur 04/25/2016   Chronic combined systolic and diastolic CHF (congestive heart failure) (HCC) 04/07/2016   Bronchitis 03/06/2016   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   Bilateral lower extremity edema 08/28/2015   Chronic respiratory failure with hypoxia and hypercapnia (HCC) 06/28/2015   Macular degeneration 04/10/2015   Fall at home, initial encounter 12/04/2014   Hypercalcemia 10/15/2014   Hypoxia 10/15/2014   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HCC) 03/12/2014   Cervical cancer screening 02/28/2014   Eustachian tube dysfunction 01/29/2014   Medicare annual wellness visit, subsequent 12/04/2013   Constipation 06/05/2013   Shortness of breath 03/07/2013   Tachycardia 02/23/2013   Sun-damaged skin 10/24/2012   Lower urinary tract infectious disease 10/24/2012   Back pain 04/07/2011   Allergic rhinitis 05/23/2009   Gout 01/11/2008   VARICOSE VEINS, LOWER EXTREMITIES 06/01/2007   ADJ DISORDER WITH MIXED ANXIETY & DEPRESSED MOOD 03/25/2007   Hyperlipidemia, mixed 12/29/2006   Essential hypertension 12/29/2006    Osteopenia 12/29/2006   Abdominal pain 12/29/2006   Diabetes type 2, controlled (HCC) 12/29/2006   Asthma, moderate persistent 08/21/2006   GERD 08/21/2006   Osteoarthritis 08/21/2006   Phlebitis and thrombophlebitis 08/21/2006   Past Medical History:  Diagnosis Date   Abnormal glucose tolerance test    Abnormal TSH 09/22/2016   ACE-inhibitor cough    Anemia 03/12/2014   Arthritis    Asthma    PFT 02/06/09 FEV1 1.41 (65%), FVC 1.92 (64), FEV1% 74, TLC 3.47 (71%), DLCO 48%, +BD   Atypical chest pain    s/p cath, Normal coronaries, Non ST elevation myocardial infarction, Rt groin pseudoaneurysm   Bacterial vaginosis 03/12/2014   Cellulitis 06/22/2016   Chronic kidney disease (CKD), stage III (moderate) (HCC) 08/04/2016   COPD (chronic obstructive pulmonary disease) (HCC)    Depression    Diastolic heart failure (HCC) 04/07/2016   DVT (deep venous thrombosis) (HCC) 1987   GERD (gastroesophageal reflux disease)    Gout    Hypercalcemia 10/15/2014   Hyperlipidemia    Hypertension    Hypoxia 10/15/2014   Macular degeneration 04/10/2015   Osteopenia 12/29/2006   Qualifier: Diagnosis of  By:  Artist Pais DO, DMolly Maduro    Pneumonia    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   Renal insufficiency 09/22/2016   Unspecified constipation 06/05/2013   Vitamin D deficiency 01/01/2015    Family History  Problem Relation Age of Onset   Asthma Sister    Hypertension Sister    Hyperlipidemia Sister    Uterine cancer Sister    Coronary artery disease Brother        x2   Arthritis Brother    Lung cancer Brother        smoker   Hypertension Sister    Arthritis Sister    Hyperlipidemia Sister    Emphysema Sister    COPD Sister        smoker   Heart disease Sister    Hyperlipidemia Sister    Hypertension Sister    Arthritis Sister    Emphysema Brother    Heart disease Brother         smoker   Heart attack Brother    Mental illness Father    Suicidality Father    Heart disease Mother     Hyperlipidemia Mother    Heart attack Mother    Epilepsy Daughter    Hypertension Daughter    Obesity Daughter    COPD Brother        smoker   Lung cancer Brother    Coronary artery disease Other    Colon polyps Sister     Past Surgical History:  Procedure Laterality Date   APPENDECTOMY  1951   TONSILLECTOMY  1950   TUBAL LIGATION  1968   Social History   Occupational History   Occupation: Retired    Associate Professor: Nature conservation officer  Tobacco Use   Smoking status: Never    Passive exposure: Past   Smokeless tobacco: Never  Vaping Use   Vaping Use: Never used  Substance and Sexual Activity   Alcohol use: No   Drug use: No   Sexual activity: Never    Comment: lives alone, no dietary restrictions

## 2022-06-27 NOTE — Telephone Encounter (Signed)
Auth needed for right knee gel  

## 2022-06-30 NOTE — Telephone Encounter (Signed)
VOB submitted for Monovisc, right knee  

## 2022-07-08 ENCOUNTER — Other Ambulatory Visit: Payer: Self-pay

## 2022-07-08 ENCOUNTER — Other Ambulatory Visit (HOSPITAL_COMMUNITY): Payer: Self-pay

## 2022-07-08 MED ORDER — DENOSUMAB 60 MG/ML ~~LOC~~ SOSY
PREFILLED_SYRINGE | SUBCUTANEOUS | 0 refills | Status: AC
  Filled 2022-07-08: qty 1, fill #0
  Filled 2022-07-28: qty 1, 180d supply, fill #0

## 2022-07-08 NOTE — Telephone Encounter (Signed)
Left message on machine to call back  

## 2022-07-08 NOTE — Addendum Note (Signed)
Addended by: Thelma Barge D on: 07/08/2022 02:50 PM   Modules accepted: Orders

## 2022-07-08 NOTE — Telephone Encounter (Signed)
Pt scheduled for 7/26 and rx sent to [facility].

## 2022-07-14 ENCOUNTER — Other Ambulatory Visit: Payer: Self-pay | Admitting: Family Medicine

## 2022-07-14 DIAGNOSIS — M549 Dorsalgia, unspecified: Secondary | ICD-10-CM

## 2022-07-15 NOTE — Telephone Encounter (Signed)
Requesting: tramadol 50mg   Contract: 09/24/21 UDS: 09/24/21 Last Visit: 06/10/22 Next Visit: 09/16/22 Last Refill: 06/04/22 #90 and 0RF   Please Advise

## 2022-07-16 DIAGNOSIS — N184 Chronic kidney disease, stage 4 (severe): Secondary | ICD-10-CM | POA: Diagnosis not present

## 2022-07-16 DIAGNOSIS — D631 Anemia in chronic kidney disease: Secondary | ICD-10-CM | POA: Diagnosis not present

## 2022-07-16 DIAGNOSIS — E1122 Type 2 diabetes mellitus with diabetic chronic kidney disease: Secondary | ICD-10-CM | POA: Diagnosis not present

## 2022-07-16 DIAGNOSIS — I129 Hypertensive chronic kidney disease with stage 1 through stage 4 chronic kidney disease, or unspecified chronic kidney disease: Secondary | ICD-10-CM | POA: Diagnosis not present

## 2022-07-17 ENCOUNTER — Telehealth (HOSPITAL_BASED_OUTPATIENT_CLINIC_OR_DEPARTMENT_OTHER): Payer: Self-pay

## 2022-07-17 LAB — LAB REPORT - SCANNED
Creatinine, POC: 51.3 mg/dL
EGFR: 25

## 2022-07-17 NOTE — Telephone Encounter (Signed)
Approved for Monovisc-Right knee B&B $10 copay Covered at 100% once OOP is met No prior auth required

## 2022-07-17 NOTE — Telephone Encounter (Signed)
Lvm for pt to cb to schedule

## 2022-07-18 ENCOUNTER — Other Ambulatory Visit (HOSPITAL_COMMUNITY): Payer: Self-pay

## 2022-07-21 ENCOUNTER — Other Ambulatory Visit: Payer: Self-pay | Admitting: Family

## 2022-07-21 ENCOUNTER — Encounter: Payer: Self-pay | Admitting: Family

## 2022-07-21 ENCOUNTER — Inpatient Hospital Stay: Payer: Medicare HMO

## 2022-07-21 ENCOUNTER — Inpatient Hospital Stay: Payer: Medicare HMO | Attending: Family Medicine

## 2022-07-21 ENCOUNTER — Inpatient Hospital Stay: Payer: Medicare HMO | Admitting: Family

## 2022-07-21 VITALS — BP 126/50 | HR 100 | Temp 98.0°F | Resp 17 | Wt 177.0 lb

## 2022-07-21 DIAGNOSIS — N184 Chronic kidney disease, stage 4 (severe): Secondary | ICD-10-CM

## 2022-07-21 DIAGNOSIS — D631 Anemia in chronic kidney disease: Secondary | ICD-10-CM | POA: Diagnosis not present

## 2022-07-21 DIAGNOSIS — D509 Iron deficiency anemia, unspecified: Secondary | ICD-10-CM | POA: Diagnosis not present

## 2022-07-21 DIAGNOSIS — N183 Chronic kidney disease, stage 3 unspecified: Secondary | ICD-10-CM | POA: Diagnosis not present

## 2022-07-21 LAB — CMP (CANCER CENTER ONLY)
ALT: 7 U/L (ref 0–44)
AST: 12 U/L — ABNORMAL LOW (ref 15–41)
Albumin: 4.2 g/dL (ref 3.5–5.0)
Alkaline Phosphatase: 48 U/L (ref 38–126)
Anion gap: 8 (ref 5–15)
BUN: 43 mg/dL — ABNORMAL HIGH (ref 8–23)
CO2: 33 mmol/L — ABNORMAL HIGH (ref 22–32)
Calcium: 9.8 mg/dL (ref 8.9–10.3)
Chloride: 100 mmol/L (ref 98–111)
Creatinine: 1.85 mg/dL — ABNORMAL HIGH (ref 0.44–1.00)
GFR, Estimated: 27 mL/min — ABNORMAL LOW (ref >60)
Glucose, Bld: 112 mg/dL — ABNORMAL HIGH (ref 70–99)
Potassium: 5.1 mmol/L (ref 3.5–5.1)
Sodium: 141 mmol/L (ref 135–145)
Total Bilirubin: 0.7 mg/dL (ref 0.3–1.2)
Total Protein: 7.1 g/dL (ref 6.5–8.1)

## 2022-07-21 LAB — CBC WITH DIFFERENTIAL (CANCER CENTER ONLY)
Abs Immature Granulocytes: 0.01 10*3/uL (ref 0.00–0.07)
Basophils Absolute: 0 10*3/uL (ref 0.0–0.1)
Basophils Relative: 0 %
Eosinophils Absolute: 0.4 10*3/uL (ref 0.0–0.5)
Eosinophils Relative: 5 %
HCT: 35.5 % — ABNORMAL LOW (ref 36.0–46.0)
Hemoglobin: 10.8 g/dL — ABNORMAL LOW (ref 12.0–15.0)
Immature Granulocytes: 0 %
Lymphocytes Relative: 13 %
Lymphs Abs: 0.9 10*3/uL (ref 0.7–4.0)
MCH: 30.7 pg (ref 26.0–34.0)
MCHC: 30.4 g/dL (ref 30.0–36.0)
MCV: 100.9 fL — ABNORMAL HIGH (ref 80.0–100.0)
Monocytes Absolute: 0.8 10*3/uL (ref 0.1–1.0)
Monocytes Relative: 12 %
Neutro Abs: 5 10*3/uL (ref 1.7–7.7)
Neutrophils Relative %: 70 %
Platelet Count: 121 10*3/uL — ABNORMAL LOW (ref 150–400)
RBC: 3.52 MIL/uL — ABNORMAL LOW (ref 3.87–5.11)
RDW: 14.9 % (ref 11.5–15.5)
WBC Count: 7.2 10*3/uL (ref 4.0–10.5)
nRBC: 0 % (ref 0.0–0.2)

## 2022-07-21 MED ORDER — EPOETIN ALFA-EPBX 40000 UNIT/ML IJ SOLN
40000.0000 [IU] | Freq: Once | INTRAMUSCULAR | Status: AC
  Administered 2022-07-21: 40000 [IU] via SUBCUTANEOUS
  Filled 2022-07-21: qty 1

## 2022-07-21 NOTE — Patient Instructions (Signed)

## 2022-07-21 NOTE — Progress Notes (Signed)
Hematology and Oncology Follow Up Visit  [name] [id number] 06/22/1940 82 y.o. 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 Napoleone is here today for follow-up. She is doing well but has had a couple falls. One where she slid out of her chair. Her left hip has been hurting but she is able to walk without any issues with her Rolator. She plans to follow-up with ortho today and see if they want to evaluate.  No syncope reported.  No fever, chills, n/v, cough, rash, dizziness, SOB, chest pain, palpitations, abdominal pain or changes in bowel or bladder habits.  She is on supplement O2 24 hours a day and states that this is working well.  She has not noted any blood loss. No petechiae.  No numbness or tingling in her extremities.  Chronic swelling in her lower extremities is unchanged from baseline.  Appetite and hydration are good. Weight is 177 lbs.   ECOG Performance Status: 1 - Symptomatic but completely ambulatory  Medications:  Allergies as of 07/21/2022       Reactions   Montelukast Sodium Palpitations, Other (See Comments)   REACTION: HEART PALPITATIONS, CHEST PAIN.   Sulfa Antibiotics Other (See Comments)   CHEST PAIN   Sulfonamide Derivatives Other (See Comments)   CHEST PAIN   Ace Inhibitors Other (See Comments)   PT. STATED UNKNOWN REACTION   Indomethacin Other (See Comments)   PT. STATED UNKNOWN REACTION        Medication List        Accurate as of July 21, 2022 11:10 AM. If you have any questions, ask your nurse or doctor.          Accu-Chek Aviva Soln USE AS DIRECTED WITH GLUCOMETER   Accu-Chek Guide test strip Generic drug: glucose blood Check sugars daily and prn   Accu-Chek Guide w/Device Kit USE AS DIRECTED TO CHECK BLOOD SUGAR   Accu-Chek Softclix Lancets lancets Check sugars daily  and prn   albuterol 108 (90 Base) MCG/ACT inhaler Commonly known as: VENTOLIN HFA Inhale 2 puffs into the lungs every 4 (four) hours as needed for wheezing or shortness of breath.   allopurinol 100 MG tablet Commonly known as: ZYLOPRIM TAKE 2 TABLETS ONE TIME DAILY   b complex vitamins tablet Take 1 tablet by mouth daily.   blood glucose meter kit and supplies Check blood sugars once weekly.   cyanocobalamin 1000 MCG tablet Commonly known as: VITAMIN B12 Take 1,000 mcg by mouth daily.   denosumab 60 MG/ML Sosy injection Commonly known as: PROLIA To be given in office.  Pt has appt 08/15/22.  Dx code: M81.0   docusate sodium 100 MG capsule Commonly known as: COLACE Take 1 capsule (100 mg total) by mouth 2 (two) times daily.   DropSafe Alcohol Prep 70 % Pads USE AS DIRECTED  BEFORE  CHECKING BLOOD SUGAR   fluticasone 50 MCG/ACT nasal spray Commonly known as: FLONASE Place 2 sprays into both nostrils daily.   furosemide 40 MG tablet Commonly known as: LASIX TAKE 1 TAB DAILY , TAKE 2ND TAB DAILY AS NEEDED FOR INCREASED EDEMA OR WEIGHT GAIN OVER 3 LBS/24 HRS   ipratropium-albuterol 0.5-2.5 (3) MG/3ML Soln Commonly known as: DUONEB Take 3 mLs by nebulization every 4 (four) hours as needed.   loratadine  10 MG tablet Commonly known as: CLARITIN Take 1 tablet (10 mg total) by mouth at bedtime. What changed:  when to take this reasons to take this   metoprolol succinate 50 MG 24 hr tablet Commonly known as: TOPROL-XL TAKE 1 TABLET EVERY DAY. TAKE WITH OR IMMEDIATELY FOLLOWING A MEAL   OXYGEN Inhale 2 L into the lungs continuous.   pantoprazole 40 MG tablet Commonly known as: PROTONIX TAKE 1 TABLET EVERY DAY   polyethylene glycol 17 g packet Commonly known as: MIRALAX / GLYCOLAX Take 17 g by mouth daily.   pravastatin 40 MG tablet Commonly known as: PRAVACHOL TAKE 1 TABLET EVERY DAY   PROBIOTIC PO Take 1 tablet by mouth daily.   Symbicort 160-4.5 MCG/ACT  inhaler Generic drug: budesonide-formoterol INHALE 2 PUFFS TWICE DAILY IN THE MORNING AND AT BEDTIME   traMADol 50 MG tablet Commonly known as: ULTRAM TAKE 1 TABLET BY MOUTH EVERY 6 HOURS AS NEEDED   trolamine salicylate 10 % cream Commonly known as: ASPERCREME Apply 1 application topically as needed for muscle pain.        Allergies:  Allergies  Allergen Reactions   Montelukast Sodium Palpitations and Other (See Comments)    REACTION: HEART PALPITATIONS, CHEST PAIN.   Sulfa Antibiotics Other (See Comments)    CHEST PAIN   Sulfonamide Derivatives Other (See Comments)    CHEST PAIN   Ace Inhibitors Other (See Comments)    PT. STATED UNKNOWN REACTION   Indomethacin Other (See Comments)    PT. STATED UNKNOWN REACTION    Past Medical History, Surgical history, Social history, and Family History were reviewed and updated.  Review of Systems: All other 10 point review of systems is negative.   Physical Exam:  weight is 177 lb (80.3 kg). Her oral temperature is 98 F (36.7 C). Her blood pressure is 126/50 (abnormal) and her pulse is 100. Her respiration is 17 and oxygen saturation is 94%.   Wt Readings from Last 3 Encounters:  07/21/22 177 lb (80.3 kg)  06/10/22 177 lb (80.3 kg)  04/21/22 177 lb 1.3 oz (80.3 kg)    Ocular: Sclerae unicteric, pupils equal, round and reactive to light Ear-nose-throat: Oropharynx clear, dentition fair Lymphatic: No cervical or supraclavicular adenopathy Lungs no rales or rhonchi, good excursion bilaterally Heart regular rate and rhythm, no murmur appreciated Abd soft, nontender, positive bowel sounds MSK no focal spinal tenderness, no joint edema Neuro: non-focal, well-oriented, appropriate affect Breasts: Deferred   Lab Results  Component Value Date   WBC 4.8 06/23/2022   HGB 10.7 (L) 06/23/2022   HCT 35.9 (L) 06/23/2022   MCV 100.8 (H) 06/23/2022   PLT 116 (L) 06/23/2022   Lab Results  Component Value Date   FERRITIN 310 (H)  06/23/2022   IRON 92 06/23/2022   TIBC 251 06/23/2022   UIBC 159 06/23/2022   IRONPCTSAT 37 (H) 06/23/2022   Lab Results  Component Value Date   RETICCTPCT 0.9 06/23/2022   RBC 3.55 (L) 06/23/2022   No results found for: "KPAFRELGTCHN", "LAMBDASER", "KAPLAMBRATIO" No results found for: "IGGSERUM", "IGA", "IGMSERUM" No results found for: "TOTALPROTELP", "ALBUMINELP", "A1GS", "A2GS", "BETS", "BETA2SER", "GAMS", "MSPIKE", "SPEI"   Chemistry      Component Value Date/Time   NA 142 06/23/2022 1135   NA 143 01/27/2020 1535   K 4.5 06/23/2022 1135   CL 102 06/23/2022 1135   CO2 36 (H) 06/23/2022 1135   BUN 38 (H) 06/23/2022 1135   BUN 40 (H) 01/27/2020 1535  CREATININE 1.60 (H) 06/23/2022 1135   CREATININE 1.36 (H) 08/23/2013 1134      Component Value Date/Time   CALCIUM 10.2 06/23/2022 1135   ALKPHOS 37 (L) 06/23/2022 1135   AST 11 (L) 06/23/2022 1135   ALT 7 06/23/2022 1135   BILITOT 0.5 06/23/2022 1135       Impression and Plan: Ms. Zingale is a very pleasant 82 yo caucasian female with multifactorial anemia.  ESA given today for Hgb 10.8.  Iron studies are pending.  Lab check every 3 weeks with follow-up in 3 months.   Eileen Stanford, NP 7/1/202411:10 AM

## 2022-07-28 ENCOUNTER — Other Ambulatory Visit: Payer: Self-pay

## 2022-07-28 ENCOUNTER — Other Ambulatory Visit (HOSPITAL_COMMUNITY): Payer: Self-pay

## 2022-08-01 ENCOUNTER — Encounter: Payer: Self-pay | Admitting: Surgical

## 2022-08-01 ENCOUNTER — Ambulatory Visit: Payer: Medicare HMO | Admitting: Surgical

## 2022-08-01 DIAGNOSIS — M1711 Unilateral primary osteoarthritis, right knee: Secondary | ICD-10-CM

## 2022-08-01 MED ORDER — HYALURONAN 88 MG/4ML IX SOSY
88.0000 mg | PREFILLED_SYRINGE | INTRA_ARTICULAR | Status: AC | PRN
Start: 2022-08-01 — End: 2022-08-01
  Administered 2022-08-01: 88 mg via INTRA_ARTICULAR

## 2022-08-01 MED ORDER — LIDOCAINE HCL 1 % IJ SOLN
5.0000 mL | INTRAMUSCULAR | Status: AC | PRN
Start: 2022-08-01 — End: 2022-08-01
  Administered 2022-08-01: 5 mL

## 2022-08-01 NOTE — Progress Notes (Signed)
   Procedure Note  Patient: [name]             Date of Birth: 09-Nov-1940           MRN: [id number]             Visit Date: 08/01/2022  Procedures: Visit Diagnoses:  1. Arthritis of right knee     Large Joint Inj: R knee on 08/01/2022 1:46 PM Indications: pain, joint swelling and diagnostic evaluation Details: 18 G 1.5 in needle, superolateral approach  Arthrogram: No  Medications: 5 mL lidocaine 1 %; 88 mg Hyaluronan 88 MG/4ML Outcome: tolerated well, no immediate complications  Patient is doing well following cortisone injection.  Knees usually only last about 1 month.  She has not had any return of effusion that was taken down at her last appointment.  She is here to try gel injection for the first time.  Tolerated injection well.  She will let us know how this does for her and we can repeat this in 6 months if it works better than the cortisone injection. Procedure, treatment alternatives, risks and benefits explained, specific risks discussed. Consent was given by the patient. Immediately prior to procedure a time out was called to verify the correct patient, procedure, equipment, support staff and site/side marked as required. Patient was prepped and draped in the usual sterile fashion.

## 2022-08-04 ENCOUNTER — Other Ambulatory Visit (HOSPITAL_COMMUNITY): Payer: Self-pay

## 2022-08-08 DIAGNOSIS — I129 Hypertensive chronic kidney disease with stage 1 through stage 4 chronic kidney disease, or unspecified chronic kidney disease: Secondary | ICD-10-CM | POA: Diagnosis not present

## 2022-08-14 NOTE — Progress Notes (Signed)
[name] is a 82 y.o. female presents to the office today for Prolia injection, per physician's orders. Prolia 60 mg SQ , was administered R arm today. Patient tolerated injection. Patient next injection due: 6 months, appt made: No- will schedule in 5 months after benifits are ran again Initial injection: No Patient supplied: Yes  Creft, Feliberto Harts    DOD: PAZ

## 2022-08-15 ENCOUNTER — Ambulatory Visit: Payer: Medicare HMO

## 2022-08-15 ENCOUNTER — Telehealth: Payer: Self-pay

## 2022-08-15 ENCOUNTER — Ambulatory Visit (INDEPENDENT_AMBULATORY_CARE_PROVIDER_SITE_OTHER): Payer: Medicare HMO

## 2022-08-15 DIAGNOSIS — M810 Age-related osteoporosis without current pathological fracture: Secondary | ICD-10-CM | POA: Diagnosis not present

## 2022-08-15 MED ORDER — DENOSUMAB 60 MG/ML ~~LOC~~ SOSY
60.0000 mg | PREFILLED_SYRINGE | Freq: Once | SUBCUTANEOUS | Status: AC
Start: 2022-08-15 — End: 2022-08-15
  Administered 2022-08-15: 60 mg via SUBCUTANEOUS

## 2022-08-15 NOTE — Telephone Encounter (Signed)
Prolia done 08/15/22 due 02/16/23

## 2022-08-18 ENCOUNTER — Other Ambulatory Visit: Payer: Self-pay | Admitting: Family Medicine

## 2022-08-18 DIAGNOSIS — M549 Dorsalgia, unspecified: Secondary | ICD-10-CM

## 2022-08-18 NOTE — Telephone Encounter (Signed)
Requesting: tramadol 50mg   Contract: 09/24/21 UDS:09/24/21 Last Visit: 06/10/22 Next Visit: 09/16/22 Last Refill: 07/15/22 #90 and 0RF   Please Advise

## 2022-08-20 ENCOUNTER — Encounter (INDEPENDENT_AMBULATORY_CARE_PROVIDER_SITE_OTHER): Payer: Self-pay

## 2022-09-03 ENCOUNTER — Inpatient Hospital Stay: Payer: Medicare HMO | Attending: Family Medicine

## 2022-09-03 ENCOUNTER — Inpatient Hospital Stay: Payer: Medicare HMO

## 2022-09-03 VITALS — BP 134/71 | HR 89 | Temp 97.8°F | Resp 18

## 2022-09-03 DIAGNOSIS — N183 Chronic kidney disease, stage 3 unspecified: Secondary | ICD-10-CM | POA: Diagnosis not present

## 2022-09-03 DIAGNOSIS — D631 Anemia in chronic kidney disease: Secondary | ICD-10-CM

## 2022-09-03 DIAGNOSIS — D509 Iron deficiency anemia, unspecified: Secondary | ICD-10-CM

## 2022-09-03 LAB — CBC WITH DIFFERENTIAL (CANCER CENTER ONLY)
Abs Immature Granulocytes: 0.01 10*3/uL (ref 0.00–0.07)
Basophils Absolute: 0 10*3/uL (ref 0.0–0.1)
Basophils Relative: 1 %
Eosinophils Absolute: 0.3 10*3/uL (ref 0.0–0.5)
Eosinophils Relative: 5 %
HCT: 32.8 % — ABNORMAL LOW (ref 36.0–46.0)
Hemoglobin: 10.1 g/dL — ABNORMAL LOW (ref 12.0–15.0)
Immature Granulocytes: 0 %
Lymphocytes Relative: 19 %
Lymphs Abs: 1 10*3/uL (ref 0.7–4.0)
MCH: 30.9 pg (ref 26.0–34.0)
MCHC: 30.8 g/dL (ref 30.0–36.0)
MCV: 100.3 fL — ABNORMAL HIGH (ref 80.0–100.0)
Monocytes Absolute: 0.6 10*3/uL (ref 0.1–1.0)
Monocytes Relative: 12 %
Neutro Abs: 3.3 10*3/uL (ref 1.7–7.7)
Neutrophils Relative %: 63 %
Platelet Count: 114 10*3/uL — ABNORMAL LOW (ref 150–400)
RBC: 3.27 MIL/uL — ABNORMAL LOW (ref 3.87–5.11)
RDW: 15.7 % — ABNORMAL HIGH (ref 11.5–15.5)
WBC Count: 5.2 10*3/uL (ref 4.0–10.5)
nRBC: 0 % (ref 0.0–0.2)

## 2022-09-03 LAB — CMP (CANCER CENTER ONLY)
ALT: 8 U/L (ref 0–44)
AST: 12 U/L — ABNORMAL LOW (ref 15–41)
Albumin: 4.2 g/dL (ref 3.5–5.0)
Alkaline Phosphatase: 40 U/L (ref 38–126)
Anion gap: 7 (ref 5–15)
BUN: 31 mg/dL — ABNORMAL HIGH (ref 8–23)
CO2: 34 mmol/L — ABNORMAL HIGH (ref 22–32)
Calcium: 9.3 mg/dL (ref 8.9–10.3)
Chloride: 101 mmol/L (ref 98–111)
Creatinine: 1.68 mg/dL — ABNORMAL HIGH (ref 0.44–1.00)
GFR, Estimated: 30 mL/min — ABNORMAL LOW (ref >60)
Glucose, Bld: 101 mg/dL — ABNORMAL HIGH (ref 70–99)
Potassium: 4.7 mmol/L (ref 3.5–5.1)
Sodium: 142 mmol/L (ref 135–145)
Total Bilirubin: 0.6 mg/dL (ref 0.3–1.2)
Total Protein: 6.5 g/dL (ref 6.5–8.1)

## 2022-09-03 MED ORDER — EPOETIN ALFA-EPBX 40000 UNIT/ML IJ SOLN
40000.0000 [IU] | Freq: Once | INTRAMUSCULAR | Status: AC
  Administered 2022-09-03: 40000 [IU] via SUBCUTANEOUS
  Filled 2022-09-03: qty 1

## 2022-09-03 NOTE — Patient Instructions (Signed)

## 2022-09-04 ENCOUNTER — Inpatient Hospital Stay: Payer: Medicare HMO

## 2022-09-13 ENCOUNTER — Other Ambulatory Visit: Payer: Self-pay | Admitting: Family Medicine

## 2022-09-13 DIAGNOSIS — H6992 Unspecified Eustachian tube disorder, left ear: Secondary | ICD-10-CM

## 2022-09-15 NOTE — Assessment & Plan Note (Signed)
Hydrate and monitor 

## 2022-09-15 NOTE — Assessment & Plan Note (Signed)
Continue to monitor

## 2022-09-15 NOTE — Assessment & Plan Note (Signed)
Encouraged to get adequate exercise, calcium and vitamin d intake 

## 2022-09-15 NOTE — Assessment & Plan Note (Signed)
Asymptomatic and mild

## 2022-09-15 NOTE — Assessment & Plan Note (Signed)
Supplement and monitor 

## 2022-09-15 NOTE — Assessment & Plan Note (Signed)
Low back, Encouraged moist heat and gentle stretching as tolerated. May try tylenol and prescription meds as directed and report if symptoms worsen or seek immediate care

## 2022-09-15 NOTE — Assessment & Plan Note (Signed)
hgba1c acceptable, minimize simple carbs. Increase exercise as tolerated. Continue current meds 

## 2022-09-16 ENCOUNTER — Ambulatory Visit (INDEPENDENT_AMBULATORY_CARE_PROVIDER_SITE_OTHER): Payer: Medicare HMO | Admitting: Family Medicine

## 2022-09-16 VITALS — BP 132/74 | HR 91 | Temp 98.0°F | Resp 16 | Ht 65.0 in | Wt 177.0 lb

## 2022-09-16 DIAGNOSIS — M549 Dorsalgia, unspecified: Secondary | ICD-10-CM

## 2022-09-16 DIAGNOSIS — E1122 Type 2 diabetes mellitus with diabetic chronic kidney disease: Secondary | ICD-10-CM | POA: Diagnosis not present

## 2022-09-16 DIAGNOSIS — R7989 Other specified abnormal findings of blood chemistry: Secondary | ICD-10-CM | POA: Diagnosis not present

## 2022-09-16 DIAGNOSIS — N184 Chronic kidney disease, stage 4 (severe): Secondary | ICD-10-CM

## 2022-09-16 DIAGNOSIS — M109 Gout, unspecified: Secondary | ICD-10-CM

## 2022-09-16 DIAGNOSIS — E559 Vitamin D deficiency, unspecified: Secondary | ICD-10-CM | POA: Diagnosis not present

## 2022-09-16 DIAGNOSIS — D696 Thrombocytopenia, unspecified: Secondary | ICD-10-CM

## 2022-09-16 DIAGNOSIS — M8589 Other specified disorders of bone density and structure, multiple sites: Secondary | ICD-10-CM

## 2022-09-16 DIAGNOSIS — Z23 Encounter for immunization: Secondary | ICD-10-CM

## 2022-09-16 NOTE — Assessment & Plan Note (Signed)
Hydrate and monitor 

## 2022-09-16 NOTE — Patient Instructions (Signed)
Arexvy is the new RSV, Respiratory Syncitial virus vaccine, can get at the pharmacy  COVID booster some time in next month

## 2022-09-17 ENCOUNTER — Encounter: Payer: Self-pay | Admitting: Cardiology

## 2022-09-17 ENCOUNTER — Ambulatory Visit: Payer: Medicare HMO | Attending: Cardiology | Admitting: Cardiology

## 2022-09-17 VITALS — BP 100/58 | HR 86 | Ht 65.0 in | Wt 172.6 lb

## 2022-09-17 DIAGNOSIS — R079 Chest pain, unspecified: Secondary | ICD-10-CM | POA: Diagnosis not present

## 2022-09-17 DIAGNOSIS — I5032 Chronic diastolic (congestive) heart failure: Secondary | ICD-10-CM

## 2022-09-17 DIAGNOSIS — I739 Peripheral vascular disease, unspecified: Secondary | ICD-10-CM

## 2022-09-17 DIAGNOSIS — R55 Syncope and collapse: Secondary | ICD-10-CM

## 2022-09-17 DIAGNOSIS — E782 Mixed hyperlipidemia: Secondary | ICD-10-CM | POA: Diagnosis not present

## 2022-09-17 DIAGNOSIS — Z79899 Other long term (current) drug therapy: Secondary | ICD-10-CM | POA: Diagnosis not present

## 2022-09-17 DIAGNOSIS — R0600 Dyspnea, unspecified: Secondary | ICD-10-CM | POA: Diagnosis not present

## 2022-09-17 DIAGNOSIS — N184 Chronic kidney disease, stage 4 (severe): Secondary | ICD-10-CM

## 2022-09-17 DIAGNOSIS — I10 Essential (primary) hypertension: Secondary | ICD-10-CM | POA: Diagnosis not present

## 2022-09-17 LAB — D-DIMER, QUANTITATIVE: D-DIMER: 0.78 mg{FEU}/L — ABNORMAL HIGH (ref 0.00–0.49)

## 2022-09-17 NOTE — Patient Instructions (Signed)
Medication Instructions:  Your physician recommends that you continue on your current medications as directed. Please refer to the Current Medication list given to you today.  *If you need a refill on your cardiac medications before your next appointment, please call your pharmacy*   Lab Work: Please complete a BNP, BMET, and D-DIMER in our lab today before you leave.  Please make an appointment to come to our lab on a day when you have been fasting so you can complete a lipid panel and an ALT.  If you have labs (blood work) drawn today and your tests are completely normal, you will receive your results only by: MyChart Message (if you have MyChart) OR A paper copy in the mail If you have any lab test that is abnormal or we need to change your treatment, we will call you to review the results.   Testing/Procedures: Your physician has requested that you have a stress echocardiogram with dobutamine. For further information please visit [email or web address] Please follow instruction sheet as given.    Follow-Up: At [facility], you and your health needs are our priority.  As part of our continuing mission to provide you with exceptional heart care, we have created designated Provider Care Teams.  These Care Teams include your primary Cardiologist (physician) and Advanced Practice Providers (APPs -  Physician Assistants and Nurse Practitioners) who all work together to provide you with the care you need, when you need it.  We recommend signing up for the patient portal called "MyChart".  Sign up information is provided on this After Visit Summary.  MyChart is used to connect with patients for Virtual Visits (Telemedicine).  Patients are able to view lab/test results, encounter notes, upcoming appointments, etc.  Non-urgent messages can be sent to your provider as well.   To learn more about what you can do with MyChart, go to [email or web address].    Your next appointment:   1  year(s)  Provider:   Armanda Magic, MD

## 2022-09-17 NOTE — Addendum Note (Signed)
Addended by: Luellen Pucker on: 09/17/2022 03:02 PM   Modules accepted: Orders

## 2022-09-17 NOTE — Addendum Note (Signed)
Addended by: Luellen Pucker on: 09/17/2022 03:21 PM   Modules accepted: Orders

## 2022-09-17 NOTE — Progress Notes (Signed)
Cardiology Office Note:    Date:  09/17/2022   ID:  [name], DOB [birth year], MRN [id number]  PCP:  [name], MD  Cardiologist:  Armanda Magic, MD   Referring MD: [name], MD   Chief Complaint  Patient presents with   Congestive Heart Failure   Hypertension   Hyperlipidemia    History of Present Illness:    [name] is a 82 y.o. female with a hx of asthma, atypical CP with normal coronary arteries by cath in 2008 in the setting of NSTEMI complicated by right groin pseudoaneurysm, chronic diastolic CHF, GERD, HTN and hyperlipidemia.  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  She has chronic DOE/SOB related to COPD and her breathing is stable on 2L [place].  2D echo 10/09/2021 showed EF 50 to 55% with grade 1 diastolic dysfunction, normal RV, mild MR.  She is here today for followup and is doing well.  She has chronic shortness of breath related to COPD and is on oxygen 24/7.  She has recently been having occasionally chest pain under her left breast a few times weekly lasting around 3-5 minutes.  There is no radiation of the pain and no associated sx of nausea/diaphoresis or SOB with the pain.  She has noticed that she gets more fatigued quicker. She denies any PND, orthopnea, LE edema, dizziness, palpitations or syncope. She is compliant with her meds and is tolerating meds with no SE.    Past Medical History:  Diagnosis Date   Abnormal glucose tolerance test    Abnormal TSH 09/22/2016   ACE-inhibitor cough    Anemia 03/12/2014   Arthritis    Asthma    PFT 02/06/09 FEV1 1.41 (65%), FVC 1.92 (64), FEV1% 74, TLC 3.47 (71%), DLCO 48%, +BD   Atypical chest pain    s/p cath, Normal coronaries, Non ST elevation myocardial infarction, Rt groin pseudoaneurysm   Bacterial vaginosis 03/12/2014   Cellulitis 06/22/2016   Chronic kidney disease (CKD), stage III (moderate) (HCC) 08/04/2016   COPD (chronic obstructive pulmonary disease) (HCC)     Depression    Diastolic heart failure (HCC) 04/07/2016   DVT (deep venous thrombosis) (HCC) 1987   GERD (gastroesophageal reflux disease)    Gout    Hypercalcemia 10/15/2014   Hyperlipidemia    Hypertension    Hypoxia 10/15/2014   Macular degeneration 04/10/2015   Osteopenia 12/29/2006   Qualifier: Diagnosis of  By: Nena Jordan    Pneumonia    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   Alcohol Swabs (DROPSAFE ALCOHOL PREP) 70 % PADS USE AS DIRECTED  BEFORE  CHECKING BLOOD SUGAR   allopurinol (ZYLOPRIM) 100 MG tablet TAKE 2 TABLETS ONE TIME DAILY   b complex vitamins tablet Take 1 tablet by mouth daily.   Blood Glucose Calibration (ACCU-CHEK AVIVA) SOLN USE AS DIRECTED WITH GLUCOMETER   blood glucose meter kit  and supplies Check blood sugars once weekly.   Blood Glucose Monitoring Suppl (ACCU-CHEK GUIDE) w/Device KIT USE AS DIRECTED TO CHECK BLOOD SUGAR   denosumab (PROLIA) 60 MG/ML SOSY injection To be given in office.  Pt has appt 08/15/22.  Dx code: M81.0   fluticasone (FLONASE) 50 MCG/ACT nasal spray Place 2 sprays into both nostrils daily.   furosemide (LASIX) 40 MG tablet TAKE 1 TAB DAILY , TAKE 2ND TAB DAILY AS NEEDED FOR INCREASED EDEMA OR WEIGHT GAIN OVER 3 LBS/24 HRS   glucose blood (ACCU-CHEK GUIDE) test strip Check sugars daily and prn   ipratropium-albuterol (DUONEB) 0.5-2.5 (3) MG/3ML SOLN Take 3 mLs by nebulization every 4 (four) hours as needed.   loratadine (CLARITIN) 10 MG  tablet Take 1 tablet (10 mg total) by mouth at bedtime. (Patient taking differently: Take 10 mg by mouth at bedtime as needed for allergies.)   metoprolol succinate (TOPROL-XL) 50 MG 24 hr tablet TAKE 1 TABLET EVERY DAY. TAKE WITH OR IMMEDIATELY FOLLOWING A MEAL   OXYGEN Inhale 2 L into the lungs continuous.    pantoprazole (PROTONIX) 40 MG tablet TAKE 1 TABLET EVERY DAY   polyethylene glycol (MIRALAX / GLYCOLAX) 17 g packet Take 17 g by mouth daily.   pravastatin (PRAVACHOL) 40 MG tablet TAKE 1 TABLET EVERY DAY   Probiotic Product (PROBIOTIC PO) Take 1 tablet by mouth daily.    SYMBICORT 160-4.5 MCG/ACT inhaler INHALE 2 PUFFS TWICE DAILY IN THE MORNING AND AT BEDTIME   traMADol (ULTRAM) 50 MG tablet TAKE 1 TABLET BY MOUTH EVERY 6 HOURS AS NEEDED   trolamine salicylate (ASPERCREME) 10 % cream Apply 1 application topically as needed for muscle pain.   vitamin B-12 (CYANOCOBALAMIN) 1000 MCG tablet Take 1,000 mcg by mouth daily.      Allergies:   Montelukast sodium, Sulfa antibiotics, Sulfonamide derivatives, Ace inhibitors, and Indomethacin   Social History   Socioeconomic History   Marital status: Widowed    Spouse name: Not on file   Number of children: 1   Years of education: Not on file   Highest education level: 12th grade  Occupational History   Occupation: Retired    Associate Professor: [employer]  Tobacco Use   Smoking status: Never    Passive exposure: Past   Smokeless tobacco: Never  Vaping Use   Vaping status: Never Used  Substance and Sexual Activity   Alcohol use: No   Drug use: No   Sexual activity: Never    Comment: lives alone, no dietary restrictions  Other Topics Concern   Not on file  Social History Narrative   Not on file   Social Determinants of Health   Financial Resource Strain: Low Risk  (07/25/2020)   Overall Financial Resource Strain (CARDIA)    Difficulty of Paying Living Expenses: Not hard at all  Food Insecurity: No Food Insecurity (06/09/2022)   Hunger  Vital Sign    Worried About Running Out of Food in the Last Year: Never true    Ran Out of Food in the Last Year: Never true  Transportation Needs: No Transportation Needs (06/09/2022)   PRAPARE - Administrator, Civil Service (Medical): No    Lack of Transportation (Non-Medical): No  Physical Activity: Insufficiently Active (06/09/2022)   Exercise Vital Sign    Days of Exercise per Week: 2 days    Minutes of Exercise per Session: 20 min  Stress: No Stress Concern Present (06/09/2022)   [employer] of Occupational Health -  Occupational Stress Questionnaire    Feeling of Stress : Not at all  Social Connections: Moderately Isolated (06/09/2022)   Social Connection and Isolation Panel [NHANES]    Frequency of Communication with Friends and Family: More than three times a week    Frequency of Social Gatherings with Friends and Family: Once a week    Attends Religious Services: 1 to 4 times per year    Active Member of [employer] or Organizations: No    Attends Banker Meetings: Never    Marital Status: Widowed     Family History: The patient's family history includes Arthritis in her brother, sister, and sister; Asthma in her sister; COPD in her brother and sister; Colon polyps in her sister; Coronary artery disease in her brother and another family member; Emphysema in her brother and sister; Epilepsy in her daughter; Heart attack in her brother and mother; Heart disease in her brother, mother, and sister; Hyperlipidemia in her mother, sister, sister, and sister; Hypertension in her daughter, sister, sister, and sister; Lung cancer in her brother and brother; Mental illness in her father; Obesity in her daughter; Suicidality in her father; Uterine cancer in her sister.  ROS:   Please see the history of present illness.    Review of Systems  Skin:  Negative for unusual hair distribution.  Allergic/Immunologic: Negative for environmental allergies.    All other  systems reviewed and negative.   EKGs/Labs/Other Studies Reviewed:    The following studies were reviewed today:  EKG Interpretation Date/Time:  Wednesday September 17 2022 14:29:23 EDT Ventricular Rate:  86 PR Interval:  192 QRS Duration:  74 QT Interval:  350 QTC Calculation: 418 R Axis:   28  Text Interpretation: Sinus rhythm with sinus arrhythmia with occasional Premature ventricular complexes When compared with ECG of 04-Nov-2017 11:15, PREVIOUS ECG IS PRESENT Confirmed by Armanda Magic [phone]) on 09/17/2022 2:48:13 PM    Recent Labs: 09/24/2021: TSH 3.55 09/03/2022: ALT 8; BUN 31; Creatinine 1.68; Hemoglobin 10.1; Platelet Count 114; Potassium 4.7; Sodium 142   Recent Lipid Panel    Component Value Date/Time   CHOL 124 11/12/2020 1628   CHOL 151 07/27/2019 1701   TRIG 106.0 11/12/2020 1628   TRIG 122 01/27/2006 1037   HDL 50.40 11/12/2020 1628   HDL 47 07/27/2019 1701   CHOLHDL 2 11/12/2020 1628   VLDL 21.2 11/12/2020 1628   LDLCALC 53 11/12/2020 1628   LDLCALC 75 07/27/2019 1701   LDLDIRECT 73.0 06/16/2017 1543    Physical Exam:    VS:  BP (!) 100/58 (BP Location: Right Arm, Patient Position: Sitting, Cuff Size: Large)   Pulse 86   Ht 5\' 5"  (1.651 m)   Wt 172 lb 9.6 oz (78.3 kg)   SpO2 98%   BMI 28.72 kg/m     Wt Readings from Last 3 Encounters:  09/17/22 172 lb 9.6 oz (78.3 kg)  09/16/22 177 lb (80.3 kg)  07/21/22 177 lb (80.3 kg)    GEN: Well nourished, well developed in no acute distress HEENT: Normal NECK: No JVD; No carotid bruits LYMPHATICS: No lymphadenopathy CARDIAC:RRR, no murmurs, rubs, gallops RESPIRATORY:  Clear to auscultation without rales, wheezing or rhonchi  ABDOMEN: Soft, non-tender, non-distended MUSCULOSKELETAL:  No edema; No deformity  SKIN: Warm and dry NEUROLOGIC:  Alert and oriented x 3 PSYCHIATRIC:  Normal affect  ASSESSMENT:    1. Chronic diastolic (congestive) heart failure (HCC)   2. Chest pain, unspecified type      PLAN:  In order of problems listed above:  1.  Chronic diastolic CHF -She is euvolemic on exam today -she has chronic DOE related to COPD but her shortness of breath and lower extremity edema are stable -I have personally reviewed and interpreted outside labs performed by patient's PCP which showed serum creatinine 1.68 and potassium 4.7 on 09/03/2022 -Continue prescription drug management with Lasix 40 mg daily with as needed refills  2.  HTN -BP is controlled on exam today -Continue prescription drug management with Toprol XL 50 mg daily with as needed refills  3.  Syncope -she has not had any further dizziness or syncope  4. PAD -Doppler 07/2016 showed greater than 50% bilateral external iliac artery stenosis, 50 to 74% bilateral CFA stenosis, 50 to 74% right distal SFA stenosis with three-vessel runoff bilaterally. -See by Dr. Allyson Sabal and felt that patient's LE pain was not due to arterial insufficiency -Continue statin therapy and walking program  5.  HLD -LDL goal < 70 -I have personally reviewed and interpreted outside labs performed by patient's PCP which showed LDL 53 and HDL 50 on 11/12/2020 -Repeat FLP and ALT -Continue drug management with pravastatin 40 mg daily with as needed refills  6.  CKD stage 4 -followed by nephrology>> most recent serum creatinine was 1.68 on 09/03/2022 -renal is managing diuretics  7.  Chest pain -she has been having CP recently and unclear whether related to her underlying COPD or cardiac -EKG shows NSR with rare PVCs and PACs -check 2D echo -I will get a dobutamine myoview to rule out ischemia (will do dobutamine due to her hx of significant COPD).  Avoid coronary CTA in setting of CKD. -Informed Consent   Shared Decision Making/Informed Consent The risks [chest pain, shortness of breath, cardiac arrhythmias, dizziness, blood pressure fluctuations, myocardial infarction, stroke/transient ischemic attack, nausea, vomiting, allergic  reaction, radiation exposure, metallic taste sensation and life-threatening complications (estimated to be 1 in 10,000)], benefits (risk stratification, diagnosing coronary artery disease, treatment guidance) and alternatives of a nuclear stress test were discussed in detail with [name] and she agrees to proceed.     Followup with me in 1 year  Medication Adjustments/Labs and Tests Ordered: Current medicines are reviewed at length with the patient today.  Concerns regarding medicines are outlined above.  Orders Placed This Encounter  Procedures   EKG 12-Lead   No orders of the defined types were placed in this encounter.   Signed, Armanda Magic, MD  09/17/2022 2:44 PM    [facility]

## 2022-09-17 NOTE — Progress Notes (Signed)
Subjective:    Patient ID: [name], female    DOB: 03-27-1940, 82 y.o.   MRN: [id number]  Chief Complaint  Patient presents with   Follow-up    Follow up    HPI Discussed the use of AI scribe software for clinical note transcription with the patient, who gave verbal consent to proceed.  History of Present Illness   The patient, with a history of pulmonary issues, presents with concerns about their current medications and sinus problems. They report a recent near-fall incident, but no significant injuries were sustained. The patient also mentions having sinus problems occasionally and questions the advice against taking decongestants. They are currently on Prolia for osteoporosis and express curiosity about its function. The patient also mentions a future appointment at the cancer center where they expect to have blood work done.        Past Medical History:  Diagnosis Date   Abnormal glucose tolerance test    Abnormal TSH 09/22/2016   ACE-inhibitor cough    Anemia 03/12/2014   Arthritis    Asthma    PFT 02/06/09 FEV1 1.41 (65%), FVC 1.92 (64), FEV1% 74, TLC 3.47 (71%), DLCO 48%, +BD   Atypical chest pain    s/p cath, Normal coronaries, Non ST elevation myocardial infarction, Rt groin pseudoaneurysm   Bacterial vaginosis 03/12/2014   Cellulitis 06/22/2016   Chronic kidney disease (CKD), stage III (moderate) (HCC) 08/04/2016   COPD (chronic obstructive pulmonary disease) (HCC)    Depression    Diastolic heart failure (HCC) 04/07/2016   DVT (deep venous thrombosis) (HCC) 1987   GERD (gastroesophageal reflux disease)    Gout    Hypercalcemia 10/15/2014   Hyperlipidemia    Hypertension    Hypoxia 10/15/2014   Macular degeneration 04/10/2015   Osteopenia 12/29/2006   Qualifier: Diagnosis of  By: Artist Pais DO, Barbette Hair    Pneumonia    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   Renal insufficiency 09/22/2016   Unspecified constipation 06/05/2013   Vitamin D deficiency 01/01/2015     Past Surgical History:  Procedure Laterality Date   APPENDECTOMY  1951   TONSILLECTOMY  1950   TUBAL LIGATION  1968    Family History  Problem Relation Age of Onset   Asthma Sister    Hypertension Sister    Hyperlipidemia Sister    Uterine cancer Sister    Coronary artery disease Brother        x2   Arthritis Brother    Lung cancer Brother        smoker   Hypertension Sister    Arthritis Sister    Hyperlipidemia Sister    Emphysema Sister    COPD Sister        smoker   Heart disease Sister    Hyperlipidemia Sister    Hypertension Sister    Arthritis Sister    Emphysema Brother    Heart disease Brother         smoker   Heart attack Brother    Mental illness Father    Suicidality Father    Heart disease Mother    Hyperlipidemia Mother    Heart attack Mother    Epilepsy Daughter    Hypertension Daughter    Obesity Daughter    COPD Brother        smoker   Lung cancer Brother    Coronary artery disease Other    Colon polyps Sister     Social History   Socioeconomic History  Marital status: Widowed    Spouse name: Not on file   Number of children: 1   Years of education: Not on file   Highest education level: 12th grade  Occupational History   Occupation: Retired    Associate Professor: [employer]  Tobacco Use   Smoking status: Never    Passive exposure: Past   Smokeless tobacco: Never  Vaping Use   Vaping status: Never Used  Substance and Sexual Activity   Alcohol use: No   Drug use: No   Sexual activity: Never    Comment: lives alone, no dietary restrictions  Other Topics Concern   Not on file  Social History Narrative   Not on file   Social Determinants of Health   Financial Resource Strain: Low Risk  (07/25/2020)   Overall Financial Resource Strain (CARDIA)    Difficulty of Paying Living Expenses: Not hard at all  Food Insecurity: No Food Insecurity (06/09/2022)   Hunger Vital Sign    Worried About Running Out of Food in the Last Year: Never true     Ran Out of Food in the Last Year: Never true  Transportation Needs: No Transportation Needs (06/09/2022)   PRAPARE - Administrator, Civil Service (Medical): No    Lack of Transportation (Non-Medical): No  Physical Activity: Insufficiently Active (06/09/2022)   Exercise Vital Sign    Days of Exercise per Week: 2 days    Minutes of Exercise per Session: 20 min  Stress: No Stress Concern Present (06/09/2022)   [employer] of Occupational Health - Occupational Stress Questionnaire    Feeling of Stress : Not at all  Social Connections: Moderately Isolated (06/09/2022)   Social Connection and Isolation Panel [NHANES]    Frequency of Communication with Friends and Family: More than three times a week    Frequency of Social Gatherings with Friends and Family: Once a week    Attends Religious Services: 1 to 4 times per year    Active Member of [employer] or Organizations: No    Attends Banker Meetings: Never    Marital Status: Widowed  Intimate Partner Violence: Not At Risk (07/25/2020)   Humiliation, Afraid, Rape, and Kick questionnaire    Fear of Current or Ex-Partner: No    Emotionally Abused: No    Physically Abused: No    Sexually Abused: No    Outpatient Medications Prior to Visit  Medication Sig Dispense Refill   Accu-Chek Softclix Lancets lancets Check sugars daily and prn 100 each 3   albuterol (VENTOLIN HFA) 108 (90 Base) MCG/ACT inhaler Inhale 2 puffs into the lungs every 4 (four) hours as needed for wheezing or shortness of breath. 18 g 3   Alcohol Swabs (DROPSAFE ALCOHOL PREP) 70 % PADS USE AS DIRECTED  BEFORE  CHECKING BLOOD SUGAR 100 each 10   allopurinol (ZYLOPRIM) 100 MG tablet TAKE 2 TABLETS ONE TIME DAILY 180 tablet 3   b complex vitamins tablet Take 1 tablet by mouth daily.     Blood Glucose Calibration (ACCU-CHEK AVIVA) SOLN USE AS DIRECTED WITH GLUCOMETER 1 each 0   blood glucose meter kit and supplies Check blood sugars once weekly. 1 each 0    Blood Glucose Monitoring Suppl (ACCU-CHEK GUIDE) w/Device KIT USE AS DIRECTED TO CHECK BLOOD SUGAR 1 kit 1   denosumab (PROLIA) 60 MG/ML SOSY injection To be given in office.  Pt has appt 08/15/22.  Dx code: M81.0 1 mL 0   docusate sodium (COLACE) 100 MG  capsule Take 1 capsule (100 mg total) by mouth 2 (two) times daily. (Patient not taking: Reported on 09/17/2022) 10 capsule 0   fluticasone (FLONASE) 50 MCG/ACT nasal spray Place 2 sprays into both nostrils daily. 48 g 1   furosemide (LASIX) 40 MG tablet TAKE 1 TAB DAILY , TAKE 2ND TAB DAILY AS NEEDED FOR INCREASED EDEMA OR WEIGHT GAIN OVER 3 LBS/24 HRS 90 tablet 3   glucose blood (ACCU-CHEK GUIDE) test strip Check sugars daily and prn 100 strip 3   ipratropium-albuterol (DUONEB) 0.5-2.5 (3) MG/3ML SOLN Take 3 mLs by nebulization every 4 (four) hours as needed. 360 mL 11   loratadine (CLARITIN) 10 MG tablet Take 1 tablet (10 mg total) by mouth at bedtime. (Patient taking differently: Take 10 mg by mouth at bedtime as needed for allergies.) 30 tablet 5   metoprolol succinate (TOPROL-XL) 50 MG 24 hr tablet TAKE 1 TABLET EVERY DAY. TAKE WITH OR IMMEDIATELY FOLLOWING A MEAL 90 tablet 3   OXYGEN Inhale 2 L into the lungs continuous.      pantoprazole (PROTONIX) 40 MG tablet TAKE 1 TABLET EVERY DAY 90 tablet 3   polyethylene glycol (MIRALAX / GLYCOLAX) 17 g packet Take 17 g by mouth daily. 14 each 0   pravastatin (PRAVACHOL) 40 MG tablet TAKE 1 TABLET EVERY DAY 90 tablet 3   Probiotic Product (PROBIOTIC PO) Take 1 tablet by mouth daily.      SYMBICORT 160-4.5 MCG/ACT inhaler INHALE 2 PUFFS TWICE DAILY IN THE MORNING AND AT BEDTIME 3 each 3   traMADol (ULTRAM) 50 MG tablet TAKE 1 TABLET BY MOUTH EVERY 6 HOURS AS NEEDED 90 tablet 0   trolamine salicylate (ASPERCREME) 10 % cream Apply 1 application topically as needed for muscle pain.     vitamin B-12 (CYANOCOBALAMIN) 1000 MCG tablet Take 1,000 mcg by mouth daily.      No facility-administered medications  prior to visit.    Allergies  Allergen Reactions   Montelukast Sodium Palpitations and Other (See Comments)    REACTION: HEART PALPITATIONS, CHEST PAIN.   Sulfa Antibiotics Other (See Comments)    CHEST PAIN   Sulfonamide Derivatives Other (See Comments)    CHEST PAIN   Ace Inhibitors Other (See Comments)    PT. STATED UNKNOWN REACTION   Indomethacin Other (See Comments)    PT. STATED UNKNOWN REACTION    Review of Systems  Constitutional:  Positive for malaise/fatigue. Negative for fever.  HENT:  Negative for congestion.   Eyes:  Negative for blurred vision.  Respiratory:  Negative for shortness of breath.   Cardiovascular:  Negative for chest pain, palpitations and leg swelling.  Gastrointestinal:  Negative for abdominal pain, blood in stool and nausea.  Genitourinary:  Negative for dysuria and frequency.  Musculoskeletal:  Negative for falls.  Skin:  Negative for rash.  Neurological:  Negative for dizziness, loss of consciousness and headaches.  Endo/Heme/Allergies:  Negative for environmental allergies.  Psychiatric/Behavioral:  Negative for depression. The patient is not nervous/anxious.        Objective:    Physical Exam  BP 132/74 (BP Location: Left Arm, Patient Position: Sitting, Cuff Size: Normal)   Pulse 91   Temp 98 F (36.7 C) (Oral)   Resp 16   Ht 5\' 5"  (1.651 m)   Wt 177 lb (80.3 kg)   SpO2 95%   BMI 29.45 kg/m  Wt Readings from Last 3 Encounters:  09/17/22 172 lb 9.6 oz (78.3 kg)  09/16/22 177 lb (80.3  kg)  07/21/22 177 lb (80.3 kg)    Diabetic Foot Exam - Simple   No data filed    Lab Results  Component Value Date   WBC 5.2 09/03/2022   HGB 10.1 (L) 09/03/2022   HCT 32.8 (L) 09/03/2022   PLT 114 (L) 09/03/2022   GLUCOSE 101 (H) 09/03/2022   CHOL 124 11/12/2020   TRIG 106.0 11/12/2020   HDL 50.40 11/12/2020   LDLDIRECT 73.0 06/16/2017   LDLCALC 53 11/12/2020   ALT 8 09/03/2022   AST 12 (L) 09/03/2022   NA 142 09/03/2022   K 4.7  09/03/2022   CL 101 09/03/2022   CREATININE 1.68 (H) 09/03/2022   BUN 31 (H) 09/03/2022   CO2 34 (H) 09/03/2022   TSH 3.55 09/24/2021   INR 1.0 09/12/2006   HGBA1C 5.4 01/03/2022   MICROALBUR <0.7 01/03/2022    Lab Results  Component Value Date   TSH 3.55 09/24/2021   Lab Results  Component Value Date   WBC 5.2 09/03/2022   HGB 10.1 (L) 09/03/2022   HCT 32.8 (L) 09/03/2022   MCV 100.3 (H) 09/03/2022   PLT 114 (L) 09/03/2022   Lab Results  Component Value Date   NA 142 09/03/2022   K 4.7 09/03/2022   CO2 34 (H) 09/03/2022   GLUCOSE 101 (H) 09/03/2022   BUN 31 (H) 09/03/2022   CREATININE 1.68 (H) 09/03/2022   BILITOT 0.6 09/03/2022   ALKPHOS 40 09/03/2022   AST 12 (L) 09/03/2022   ALT 8 09/03/2022   PROT 6.5 09/03/2022   ALBUMIN 4.2 09/03/2022   CALCIUM 9.3 09/03/2022   ANIONGAP 7 09/03/2022   EGFR 25.0 07/17/2022   GFR 27.14 (L) 01/03/2022   Lab Results  Component Value Date   CHOL 124 11/12/2020   Lab Results  Component Value Date   HDL 50.40 11/12/2020   Lab Results  Component Value Date   LDLCALC 53 11/12/2020   Lab Results  Component Value Date   TRIG 106.0 11/12/2020   Lab Results  Component Value Date   CHOLHDL 2 11/12/2020   Lab Results  Component Value Date   HGBA1C 5.4 01/03/2022       Assessment & Plan:  Back pain, unspecified back location, unspecified back pain laterality, unspecified chronicity Assessment & Plan: Low back, Encouraged moist heat and gentle stretching as tolerated. May try tylenol and prescription meds as directed and report if symptoms worsen or seek immediate care   Controlled type 2 diabetes mellitus with chronic kidney disease, without long-term current use of insulin, unspecified CKD stage (HCC) Assessment & Plan: hgba1c acceptable, minimize simple carbs. Increase exercise as tolerated. Continue current meds    High serum vitamin B12 Assessment & Plan: Continue to monitor   Osteopenia of multiple  sites Assessment & Plan: Encouraged to get adequate exercise, calcium and vitamin d intake    Vitamin D deficiency Assessment & Plan: Supplement and monitor    Thrombocytopenia (HCC) Assessment & Plan: Asymptomatic and mild   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HCC) Assessment & Plan: Hydrate and monitor    Gout of foot, unspecified cause, unspecified chronicity, unspecified laterality Assessment & Plan: Hydrate and monitor    Need for influenza vaccination -     Flu Vaccine Trivalent High Dose (Fluad)    Assessment and Plan    COVID-19 Vaccination Patient hesitant about receiving COVID-19 booster due to concerns about side effects. Discussed the benefits of vaccination, including reducing the risk of severe illness and  transmission to others, especially in the context of the patient's age and pulmonary issues. -Encouraged patient to consider receiving COVID-19 booster in the next month due to current spike in cases.  Influenza Vaccination Discussed the importance of annual influenza vaccination. -Administer influenza vaccine today.  Respiratory Syncytial Virus (RSV) Vaccination Discussed the benefits of the new RSV vaccine, especially for elderly patients with pulmonary issues. -Recommended patient to receive the RSV vaccine at the pharmacy.  Chronic Pulmonary Issues Patient has ongoing struggles with breathing but no recent exacerbations. Currently on Duoneb for nebulizer, Symbicort combo, and albuterol. -Continue current inhaler regimen. -Follow-up with pulmonologist in October.  Osteoporosis Patient currently on Prolia. -Continue Prolia as prescribed.  General Health Maintenance / Followup Plans -Plan to follow up in 3-6 months for routine blood work, including thyroid, A1C, and cholesterol. -Advised against taking decongestants due to potential effects on heart rate and blood pressure. Recommended plain Mucinex and hydration for sinus issues.          Danise Edge, MD

## 2022-09-18 ENCOUNTER — Telehealth: Payer: Self-pay

## 2022-09-18 DIAGNOSIS — Z79899 Other long term (current) drug therapy: Secondary | ICD-10-CM

## 2022-09-18 DIAGNOSIS — R0602 Shortness of breath: Secondary | ICD-10-CM

## 2022-09-18 LAB — BASIC METABOLIC PANEL
BUN/Creatinine Ratio: 20 (ref 12–28)
BUN: 43 mg/dL — ABNORMAL HIGH (ref 8–27)
CO2: 28 mmol/L (ref 20–29)
Calcium: 9.8 mg/dL (ref 8.7–10.3)
Chloride: 97 mmol/L (ref 96–106)
Creatinine, Ser: 2.17 mg/dL — ABNORMAL HIGH (ref 0.57–1.00)
Glucose: 99 mg/dL (ref 70–99)
Potassium: 4.7 mmol/L (ref 3.5–5.2)
Sodium: 141 mmol/L (ref 134–144)
eGFR: 22 mL/min/{1.73_m2} — ABNORMAL LOW (ref >59)

## 2022-09-18 LAB — PRO B NATRIURETIC PEPTIDE: NT-Pro BNP: 1232 pg/mL — ABNORMAL HIGH (ref 0–738)

## 2022-09-18 NOTE — Addendum Note (Signed)
Addended by: Luellen Pucker on: 09/18/2022 10:46 AM   Modules accepted: Orders

## 2022-09-18 NOTE — Telephone Encounter (Signed)
Call to patient to discuss lab results and Dr. Norris Cross recommendations. No answer, left detailed message advising patient to hold her lasix starting tomorrow and then report to our lab at [facility] on Tuesday 09/23/22 to have BMET rechecked as her Cr was elevated.   Also advised that I would be ordering a STAT VQ scan which is a test that can rule out pulmonary embolism. Advised that someone would likely call to schedule this test for her tomorrow.   Labs and notes forwarded to PCP. Also asked if patient could call our office and let us know if she is currently seeing a nephrologist. Also called the number of dtr [name]) to see if she could help reach patient or help her get to these appointments. No answer, left message asking Margaret Mccreedy to call our office.

## 2022-09-18 NOTE — Telephone Encounter (Signed)
-----   Message from Armanda Magic sent at 09/18/2022 11:31 AM EDT ----- D-dimer is elevated and cannot do chest CTA due to AKI.  Please get a VQ scan stat to rule out PE.  Also her renal function has worsened and I would like her to hold her Lasix over the weekend and then repeat be met next Tuesday 9/3 and forward labs to PCP for review

## 2022-09-23 ENCOUNTER — Telehealth: Payer: Self-pay | Admitting: Cardiology

## 2022-09-23 ENCOUNTER — Ambulatory Visit: Payer: Medicare HMO

## 2022-09-23 NOTE — Telephone Encounter (Signed)
Patient is calling get her lab results.

## 2022-09-23 NOTE — Telephone Encounter (Signed)
Left message for patient informing her D-dimer was elevated and Dr. Mayford Knife would like her to have a VQ scan and chest x-ray.  Orders in computer, will forward to scheduling to reach out to patient to schedule. Will also forward to Dr. Norris Cross nurse to follow-up with patient.

## 2022-09-24 ENCOUNTER — Ambulatory Visit: Payer: Medicare HMO | Attending: Cardiology

## 2022-09-24 DIAGNOSIS — Z79899 Other long term (current) drug therapy: Secondary | ICD-10-CM | POA: Diagnosis not present

## 2022-09-24 DIAGNOSIS — E782 Mixed hyperlipidemia: Secondary | ICD-10-CM

## 2022-09-25 ENCOUNTER — Inpatient Hospital Stay: Payer: Medicare HMO

## 2022-09-25 ENCOUNTER — Inpatient Hospital Stay: Payer: Medicare HMO | Attending: Family Medicine

## 2022-09-25 VITALS — BP 131/79 | HR 93 | Temp 97.9°F | Resp 16

## 2022-09-25 DIAGNOSIS — D631 Anemia in chronic kidney disease: Secondary | ICD-10-CM

## 2022-09-25 DIAGNOSIS — D509 Iron deficiency anemia, unspecified: Secondary | ICD-10-CM

## 2022-09-25 DIAGNOSIS — N183 Chronic kidney disease, stage 3 unspecified: Secondary | ICD-10-CM | POA: Insufficient documentation

## 2022-09-25 LAB — CBC WITH DIFFERENTIAL (CANCER CENTER ONLY)
Abs Immature Granulocytes: 0.02 10*3/uL (ref 0.00–0.07)
Basophils Absolute: 0 10*3/uL (ref 0.0–0.1)
Basophils Relative: 1 %
Eosinophils Absolute: 0.2 10*3/uL (ref 0.0–0.5)
Eosinophils Relative: 4 %
HCT: 34.4 % — ABNORMAL LOW (ref 36.0–46.0)
Hemoglobin: 10.4 g/dL — ABNORMAL LOW (ref 12.0–15.0)
Immature Granulocytes: 0 %
Lymphocytes Relative: 20 %
Lymphs Abs: 1 10*3/uL (ref 0.7–4.0)
MCH: 31 pg (ref 26.0–34.0)
MCHC: 30.2 g/dL (ref 30.0–36.0)
MCV: 102.7 fL — ABNORMAL HIGH (ref 80.0–100.0)
Monocytes Absolute: 0.5 10*3/uL (ref 0.1–1.0)
Monocytes Relative: 10 %
Neutro Abs: 3.3 10*3/uL (ref 1.7–7.7)
Neutrophils Relative %: 65 %
Platelet Count: 108 10*3/uL — ABNORMAL LOW (ref 150–400)
RBC: 3.35 MIL/uL — ABNORMAL LOW (ref 3.87–5.11)
RDW: 15.1 % (ref 11.5–15.5)
WBC Count: 5 10*3/uL (ref 4.0–10.5)
nRBC: 0 % (ref 0.0–0.2)

## 2022-09-25 LAB — CMP (CANCER CENTER ONLY)
ALT: 9 U/L (ref 0–44)
AST: 13 U/L — ABNORMAL LOW (ref 15–41)
Albumin: 4.3 g/dL (ref 3.5–5.0)
Alkaline Phosphatase: 39 U/L (ref 38–126)
Anion gap: 8 (ref 5–15)
BUN: 52 mg/dL — ABNORMAL HIGH (ref 8–23)
CO2: 34 mmol/L — ABNORMAL HIGH (ref 22–32)
Calcium: 9.9 mg/dL (ref 8.9–10.3)
Chloride: 102 mmol/L (ref 98–111)
Creatinine: 1.98 mg/dL — ABNORMAL HIGH (ref 0.44–1.00)
GFR, Estimated: 25 mL/min — ABNORMAL LOW (ref >60)
Glucose, Bld: 141 mg/dL — ABNORMAL HIGH (ref 70–99)
Potassium: 4.4 mmol/L (ref 3.5–5.1)
Sodium: 144 mmol/L (ref 135–145)
Total Bilirubin: 0.5 mg/dL (ref 0.3–1.2)
Total Protein: 6.6 g/dL (ref 6.5–8.1)

## 2022-09-25 LAB — BASIC METABOLIC PANEL
BUN/Creatinine Ratio: 27 (ref 12–28)
BUN: 52 mg/dL — ABNORMAL HIGH (ref 8–27)
CO2: 29 mmol/L (ref 20–29)
Calcium: 10 mg/dL (ref 8.7–10.3)
Chloride: 99 mmol/L (ref 96–106)
Creatinine, Ser: 1.92 mg/dL — ABNORMAL HIGH (ref 0.57–1.00)
Glucose: 87 mg/dL (ref 70–99)
Potassium: 5.2 mmol/L (ref 3.5–5.2)
Sodium: 142 mmol/L (ref 134–144)
eGFR: 26 mL/min/{1.73_m2} — ABNORMAL LOW (ref >59)

## 2022-09-25 MED ORDER — EPOETIN ALFA-EPBX 40000 UNIT/ML IJ SOLN
40000.0000 [IU] | Freq: Once | INTRAMUSCULAR | Status: AC
  Administered 2022-09-25: 40000 [IU] via SUBCUTANEOUS
  Filled 2022-09-25: qty 1

## 2022-09-25 NOTE — Patient Instructions (Signed)

## 2022-09-25 NOTE — Addendum Note (Signed)
Addended by: Armanda Magic R on: 09/25/2022 08:08 AM   Modules accepted: Orders

## 2022-09-26 ENCOUNTER — Other Ambulatory Visit: Payer: Self-pay | Admitting: Family Medicine

## 2022-09-26 ENCOUNTER — Encounter: Payer: Self-pay | Admitting: Family Medicine

## 2022-09-26 DIAGNOSIS — M549 Dorsalgia, unspecified: Secondary | ICD-10-CM

## 2022-09-26 NOTE — Telephone Encounter (Signed)
[name] states that patient sees Dr. Arrie Aran at [facility].

## 2022-09-26 NOTE — Telephone Encounter (Signed)
Requesting: tramadol 50mg   Contract: 09/24/21 UDS:09/24/21 Last Visit: 09/16/22 Next Visit: 03/09/2022 Last Refill:  08/18/2022 #90 no refills   Please Advise

## 2022-09-26 NOTE — Telephone Encounter (Signed)
Called patient to confirm she is aware of appoint,ment, no answer. Left detailed voice mail per DPR. Called and spoke to dtr [name] who is aware of appointment for VQ scan and CXR 09/29/22.

## 2022-09-29 ENCOUNTER — Telehealth: Payer: Self-pay

## 2022-09-29 ENCOUNTER — Ambulatory Visit (HOSPITAL_COMMUNITY)
Admission: RE | Admit: 2022-09-29 | Discharge: 2022-09-29 | Disposition: A | Discharge status: Home / Self Care | Payer: Medicare HMO | Source: Ambulatory Visit | Attending: Cardiology | Admitting: Cardiology

## 2022-09-29 ENCOUNTER — Other Ambulatory Visit: Payer: Self-pay | Admitting: Cardiology

## 2022-09-29 ENCOUNTER — Encounter (HOSPITAL_COMMUNITY)
Admission: RE | Admit: 2022-09-29 | Discharge: 2022-09-29 | Disposition: A | Discharge status: Home / Self Care | Payer: Medicare HMO | Source: Ambulatory Visit | Attending: Cardiology | Admitting: Cardiology

## 2022-09-29 DIAGNOSIS — I517 Cardiomegaly: Secondary | ICD-10-CM | POA: Diagnosis not present

## 2022-09-29 DIAGNOSIS — R0602 Shortness of breath: Secondary | ICD-10-CM

## 2022-09-29 DIAGNOSIS — Z79899 Other long term (current) drug therapy: Secondary | ICD-10-CM

## 2022-09-29 DIAGNOSIS — R9389 Abnormal findings on diagnostic imaging of other specified body structures: Secondary | ICD-10-CM | POA: Diagnosis not present

## 2022-09-29 DIAGNOSIS — R918 Other nonspecific abnormal finding of lung field: Secondary | ICD-10-CM | POA: Diagnosis not present

## 2022-09-29 MED ORDER — TECHNETIUM TO 99M ALBUMIN AGGREGATED: 4.4000 | Freq: Once | INTRAVENOUS | Status: DC | PRN

## 2022-09-29 NOTE — Telephone Encounter (Signed)
Faxed BMET results to Dr. Reynolds Bowl at [facility] to request assistance with managing diuretics as patient is fluid overloaded with poor renal function.

## 2022-09-29 NOTE — Telephone Encounter (Signed)
-----   Message from Armanda Magic sent at 09/29/2022  2:17 PM EDT ----- Abnormal chest xray with ongoing SOB and possible acute fractures of her left sided ribs - please get in with her PCP tomorrow for further evaluation

## 2022-09-29 NOTE — Telephone Encounter (Signed)
Call to patient to discuss results, no answer. Left detailed message explaining she has abnormal results on CXR and needs to get in with PCP as soon as possible, preferably tomorrow. Explained that CXR shows rib fractures and possible infection on right.   Also called dtr [name] (DPR) to report same and advised that she get her mother in with PCP tomorrow, 09/30/22. [name] verbalizes understanding and states her mother did have a fall a few weeks ago and they were aware of rib fractures. Explained pneumonia maybe secondary to poor ventilation due to pain/discomfort with breathing s/p rib fracture.  Also advised [name] that BMET results had been faxed to Dr. Idalia Needle office and to call his office to follow up on elevated kidney numbers. [name] verbalizes understanding.

## 2022-10-01 ENCOUNTER — Other Ambulatory Visit: Payer: Self-pay

## 2022-10-01 ENCOUNTER — Ambulatory Visit (HOSPITAL_COMMUNITY): Payer: Medicare HMO | Attending: Physician Assistant

## 2022-10-01 DIAGNOSIS — R079 Chest pain, unspecified: Secondary | ICD-10-CM | POA: Diagnosis not present

## 2022-10-01 LAB — ECHOCARDIOGRAM COMPLETE
Area-P 1/2: 3.34 cm2
S' Lateral: 3.3 cm

## 2022-10-01 MED ORDER — CEFDINIR 300 MG PO CAPS: 300.0000 mg | ORAL_CAPSULE | Freq: Two times a day (BID) | ORAL | 0 refills | Status: DC

## 2022-10-02 ENCOUNTER — Encounter: Payer: Self-pay | Admitting: Cardiology

## 2022-10-02 ENCOUNTER — Telehealth: Payer: Self-pay

## 2022-10-02 ENCOUNTER — Ambulatory Visit: Payer: Medicare HMO | Admitting: Family Medicine

## 2022-10-02 DIAGNOSIS — I050 Rheumatic mitral stenosis: Secondary | ICD-10-CM | POA: Insufficient documentation

## 2022-10-02 DIAGNOSIS — R079 Chest pain, unspecified: Secondary | ICD-10-CM

## 2022-10-02 NOTE — Telephone Encounter (Signed)
Per Dr. Mayford Knife, due to dobutamine shortage, stress test type will need to be changed to lexiscan. Patient currently booked for stress test 10/20/22. Order placed. Attestation order pended.

## 2022-10-02 NOTE — Telephone Encounter (Signed)
-----   Message from Armanda Magic sent at 09/18/2022  9:19 AM EDT ----- I already did an attestation in my note Alcario Drought do you need to do anything with this? ----- Message ----- From: Minette Brine Sent: 09/18/2022   9:05 AM EDT To: Quintella Reichert, MD  Please create an Order for an ATTESTATION ( [id number]) for ordered Myoview/GXT/Stress Echocardiogram.  This must be signed by ordering Provider.  We do not accept verbal cosign.  This test will not be scheduled until ATT is obtained.

## 2022-10-03 ENCOUNTER — Ambulatory Visit: Payer: Medicare HMO | Admitting: Family Medicine

## 2022-10-03 NOTE — Progress Notes (Deleted)
Acute Office Visit  Subjective:     Patient ID: [name], female    DOB: [birth year], 81 y.o.   MRN: [id number]  No chief complaint on file.   HPI Patient is in today for ***      ROS ,     Objective:    There were no vitals taken for this visit. {Vitals History (Optional):23777}  Physical Exam  No results found for any visits on 10/03/22.      Assessment & Plan:   Problem List Items Addressed This Visit   None   No orders of the defined types were placed in this encounter.   No follow-ups on file.  Clayborne Dana, NP

## 2022-10-06 ENCOUNTER — Other Ambulatory Visit (HOSPITAL_COMMUNITY): Payer: Medicare HMO

## 2022-10-06 ENCOUNTER — Encounter: Payer: Self-pay | Admitting: Family Medicine

## 2022-10-06 ENCOUNTER — Ambulatory Visit (INDEPENDENT_AMBULATORY_CARE_PROVIDER_SITE_OTHER): Payer: Medicare HMO | Admitting: Family Medicine

## 2022-10-06 ENCOUNTER — Telehealth: Payer: Self-pay

## 2022-10-06 ENCOUNTER — Encounter (HOSPITAL_COMMUNITY): Payer: Medicare HMO

## 2022-10-06 VITALS — BP 123/58 | HR 86 | Temp 97.6°F | Ht 65.0 in | Wt 178.0 lb

## 2022-10-06 DIAGNOSIS — I5042 Chronic combined systolic (congestive) and diastolic (congestive) heart failure: Secondary | ICD-10-CM | POA: Diagnosis not present

## 2022-10-06 DIAGNOSIS — I050 Rheumatic mitral stenosis: Secondary | ICD-10-CM

## 2022-10-06 DIAGNOSIS — J189 Pneumonia, unspecified organism: Secondary | ICD-10-CM

## 2022-10-06 NOTE — Telephone Encounter (Signed)
Call to discuss echo results with patient which showed low normal LV function EF 50 to 55% with mildly thickened heart muscle and grade 2 diastolic dysfunction which is normal as a person ages. 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 Per Dr. Mayford Knife, we will continue to watch this with an ultrasound every year to make sure it does not get worse, patient verbalizes understanding. Echo order placed for 1 yr f/u.

## 2022-10-06 NOTE — Telephone Encounter (Signed)
-----   Message from Armanda Magic sent at 10/02/2022  1:41 PM EDT ----- Echo showed low normal LV function EF 50 to 55% with mildly thickened heart muscle and increased stiffness of the heart muscle when it is trying to relax to fill with blood called grade 2 diastolic dysfunction which is normal as a person ages.  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

## 2022-10-06 NOTE — Assessment & Plan Note (Signed)
Weight gain of 5-6 pounds since end of August, possibly due to fluid retention. Patient has increased diuretic use recently per cardiologist recommendations. -Weigh daily and record results. -Return to clinic in 4-7 days for weight check unless weight returns to baseline. -Continue fluid restriction of 2 liters per day. -Reduce intake of Sprite due to potential sodium and sugar content.

## 2022-10-06 NOTE — Patient Instructions (Addendum)
Glad to hear you are feeling better! Lungs sound good today and vitals are stable.  Follow-up chest xray in 6 weeks.  You do have some leg swelling and weight gain. You just took the extra 3 days of lasix as cardiology recommended, so we will need to recheck your weight and swelling by the end of the week to ensure making progress. Follow-up sooner if you develop any new or worsening symptoms.

## 2022-10-06 NOTE — Progress Notes (Signed)
Acute Office Visit  Subjective:     Patient ID: [name], female    DOB: August 11, 1940, 81 y.o.   MRN: [id number]  Chief Complaint  Patient presents with   Follow-up    HPI Patient is in today for pneumonia follow-up.   Discussed the use of AI scribe software for clinical note transcription with the patient, who gave verbal consent to proceed.  History of Present Illness   The patient, with a history of chronic lung disease and rib fractures, presented for a follow-up visit after a recent diagnosis of pneumonia. They initially reported left-sided rib pain to their cardiologist during a routine visit, which prompted further investigation. A VQ scan was performed, which was unremarkable, but a chest x-ray revealed possible pneumonia and potential fractures of the left ribs (ribs 7, 10, and 11). The patient had a history of falls, with the most recent incident occurring two years prior.  The patient was prescribed Omnicef, an antibiotic, which they completed on the day of the visit. They reported feeling significantly better, with occasional residual rib pain but no chest pain. They also noted an improvement in their breathing. The patient uses oxygen therapy continuously, currently at a rate of two liters per day.  The patient also reported some weight gain, which they attributed to fluid retention. They had recently increased their dose of diuretic medication in response to this weight gain. The patient also reported feeling more fatigued than usual and noted that they were sleeping in a recliner, which may have contributed to some leg swelling. They were unable to wear compression socks due to discomfort.         Wt Readings from Last 3 Encounters:  10/06/22 178 lb (80.7 kg)  09/17/22 172 lb 9.6 oz (78.3 kg)  09/16/22 177 lb (80.3 kg)       ROS All review of systems negative except what is listed in the HPI      Objective:    BP (!) 123/58   Pulse 86   Temp 97.6 F  (36.4 C) (Oral)   Ht 5\' 5"  (1.651 m)   Wt 178 lb (80.7 kg)   SpO2 98%   BMI 29.62 kg/m    Physical Exam Vitals reviewed.  Constitutional:      Appearance: Normal appearance.  Cardiovascular:     Rate and Rhythm: Normal rate and regular rhythm.     Heart sounds: Normal heart sounds.  Pulmonary:     Effort: Pulmonary effort is normal.     Breath sounds: Normal breath sounds. No wheezing, rhonchi or rales.  Musculoskeletal:     Right lower leg: Edema present.     Left lower leg: Edema present.  Skin:    General: Skin is warm and dry.  Neurological:     Mental Status: She is alert and oriented to person, place, and time.  Psychiatric:        Mood and Affect: Mood normal.        Behavior: Behavior normal.        Thought Content: Thought content normal.        Judgment: Judgment normal.     No results found for any visits on 10/06/22.      Assessment & Plan:   Problem List Items Addressed This Visit       Active Problems   Chronic combined systolic and diastolic CHF (congestive heart failure) (HCC)    Weight gain of 5-6 pounds since end of August,  possibly due to fluid retention. Patient has increased diuretic use recently per cardiologist recommendations. -Weigh daily and record results. -Return to clinic in 4-7 days for weight check unless weight returns to baseline. -Continue fluid restriction of 2 liters per day. -Reduce intake of Sprite due to potential sodium and sugar content.      Other Visit Diagnoses     Pneumonia of right middle lobe due to infectious organism    -  Primary Completed course of Omnicef with improvement of symptoms. -Order follow-up chest x-ray in 6-8 weeks to assess resolution of pneumonia. -Notify clinic if symptoms return after completion of antibiotics.    Relevant Orders   DG Chest 2 View       No orders of the defined types were placed in this encounter.   Return for edema f/u 4-7 days.  Clayborne Dana, NP

## 2022-10-08 DIAGNOSIS — E1122 Type 2 diabetes mellitus with diabetic chronic kidney disease: Secondary | ICD-10-CM | POA: Diagnosis not present

## 2022-10-08 DIAGNOSIS — R809 Proteinuria, unspecified: Secondary | ICD-10-CM | POA: Diagnosis not present

## 2022-10-08 DIAGNOSIS — D631 Anemia in chronic kidney disease: Secondary | ICD-10-CM | POA: Diagnosis not present

## 2022-10-08 DIAGNOSIS — I129 Hypertensive chronic kidney disease with stage 1 through stage 4 chronic kidney disease, or unspecified chronic kidney disease: Secondary | ICD-10-CM | POA: Diagnosis not present

## 2022-10-08 DIAGNOSIS — J449 Chronic obstructive pulmonary disease, unspecified: Secondary | ICD-10-CM | POA: Diagnosis not present

## 2022-10-08 DIAGNOSIS — N184 Chronic kidney disease, stage 4 (severe): Secondary | ICD-10-CM | POA: Diagnosis not present

## 2022-10-11 ENCOUNTER — Encounter (HOSPITAL_COMMUNITY): Payer: Self-pay

## 2022-10-14 ENCOUNTER — Ambulatory Visit: Payer: Medicare HMO | Admitting: Family Medicine

## 2022-10-16 ENCOUNTER — Encounter (HOSPITAL_COMMUNITY): Payer: Self-pay | Admitting: *Deleted

## 2022-10-17 ENCOUNTER — Ambulatory Visit: Payer: Medicare HMO | Admitting: Family Medicine

## 2022-10-20 ENCOUNTER — Ambulatory Visit (HOSPITAL_COMMUNITY): Payer: Medicare HMO | Attending: Cardiology

## 2022-10-20 DIAGNOSIS — R079 Chest pain, unspecified: Secondary | ICD-10-CM | POA: Insufficient documentation

## 2022-10-20 LAB — MYOCARDIAL PERFUSION IMAGING
LV dias vol: 91 mL (ref 46–106)
LV sys vol: 47 mL
Nuc Stress EF: 49 %
Rest Nuclear Isotope Dose: 9.9 mCi
SDS: 0
SRS: 0
SSS: 0
ST Depression (mm): 0 mm
Stress Nuclear Isotope Dose: 32.5 mCi
TID: 1.03

## 2022-10-20 MED ORDER — TECHNETIUM TC 99M TETROFOSMIN IV KIT
9.9000 | PACK | Freq: Once | INTRAVENOUS | Status: AC | PRN
  Administered 2022-10-20: 9.9 via INTRAVENOUS

## 2022-10-20 MED ORDER — REGADENOSON 0.4 MG/5ML IV SOLN
0.4000 mg | Freq: Once | INTRAVENOUS | Status: AC
Start: 2022-10-20 — End: 2022-10-20
  Administered 2022-10-20: 0.4 mg via INTRAVENOUS

## 2022-10-20 MED ORDER — TECHNETIUM TC 99M TETROFOSMIN IV KIT
32.5000 | PACK | Freq: Once | INTRAVENOUS | Status: AC | PRN
  Administered 2022-10-20: 32.5 via INTRAVENOUS

## 2022-10-21 ENCOUNTER — Ambulatory Visit (INDEPENDENT_AMBULATORY_CARE_PROVIDER_SITE_OTHER): Payer: Medicare HMO | Admitting: Family Medicine

## 2022-10-21 ENCOUNTER — Telehealth: Payer: Self-pay

## 2022-10-21 ENCOUNTER — Encounter: Payer: Self-pay | Admitting: Family Medicine

## 2022-10-21 VITALS — BP 128/67 | HR 90 | Temp 97.8°F | Ht 65.0 in | Wt 177.0 lb

## 2022-10-21 DIAGNOSIS — I5042 Chronic combined systolic (congestive) and diastolic (congestive) heart failure: Secondary | ICD-10-CM

## 2022-10-21 LAB — BASIC METABOLIC PANEL
BUN: 31 mg/dL — ABNORMAL HIGH (ref 6–23)
CO2: 30 meq/L (ref 19–32)
Calcium: 9.1 mg/dL (ref 8.4–10.5)
Chloride: 106 meq/L (ref 96–112)
Creatinine, Ser: 1.44 mg/dL — ABNORMAL HIGH (ref 0.40–1.20)
GFR: 34.1 mL/min — ABNORMAL LOW (ref >60.00)
Glucose, Bld: 99 mg/dL (ref 70–99)
Potassium: 4.1 meq/L (ref 3.5–5.1)
Sodium: 143 meq/L (ref 135–145)

## 2022-10-21 NOTE — Telephone Encounter (Signed)
Called patient to discuss stress test results, no answer. Left detailed message per DPR that stress test showed no ischemia. Advised patient to call our office if any questions.

## 2022-10-21 NOTE — Patient Instructions (Addendum)
Glad you are feeling better! Continue lasix as prescribed by cardiology. We will check your kidney function today and let you know if we need to change anything Recommend low sodium diet, no more than 2L of fluids to drink in a day, elevate legs as much as possible during the day, try compression socks if you can tolerate.  Per cardiology order, take an extra lasix if you cain 3 pounds or more in one day.  Keep regular follow-up appointments with specialists and PCP.  You are due for your repeat chest xray (follow-up on pneumonia) at the end of October.

## 2022-10-21 NOTE — Progress Notes (Signed)
Acute Office Visit  Subjective:     Patient ID: [name], female    DOB: [birth year], 82 y.o.   MRN: [id number]  Chief Complaint  Patient presents with   Medical Management of Chronic Issues    HPI Patient is in today for follow-up (recent pneumonia and edema/weight gain).   Discussed the use of AI scribe software for clinical note transcription with the patient, who gave verbal consent to proceed.  History of Present Illness   The patient, with a history of cardiac and renal conditions, presented for a follow-up visit after a recent episode of pneumonia. She had completed a course of Omnicef and reported improved symptoms. A follow-up chest x-ray is scheduled for the coming weeks.  During the previous visit, the patient had gained five to six pounds over two weeks. Her cardiologist had advised an increase in diuretic use and daily weight monitoring. The patient was also advised to maintain a fluid restriction of two liters per day.  The patient reported feeling well since the last visit, with no new health issues. She had been taking her diuretic regularly, except for the past two days due to a scheduled stress test and busy day. She denied any abnormal breathing or increased swelling in her legs.  Despite the recent weight gain, the patient reported that her weight had been relatively steady. She was advised to take her Lasix as prescribed by her cardiologist, and to contact the cardiology team if she noticed any significant weight gain.  The patient's pneumonia symptoms had resolved, and she was advised to schedule a repeat chest x-ray for the last week of October. She was also advised to have her kidney function rechecked today.         Wt Readings from Last 3 Encounters:  10/21/22 177 lb (80.3 kg)  10/20/22 178 lb (80.7 kg)  10/06/22 178 lb (80.7 kg)       ROS All review of systems negative except what is listed in the HPI      Objective:    BP 128/67    Pulse 90   Temp 97.8 F (36.6 C) (Oral)   Ht 5\' 5"  (1.651 m)   Wt 177 lb (80.3 kg)   SpO2 95%   BMI 29.45 kg/m    Physical Exam Vitals reviewed.  Constitutional:      Appearance: Normal appearance.  Cardiovascular:     Rate and Rhythm: Normal rate and regular rhythm.     Heart sounds: Normal heart sounds.  Pulmonary:     Effort: Pulmonary effort is normal.     Breath sounds: Normal breath sounds. No wheezing, rhonchi or rales.  Musculoskeletal:     Right lower leg: Edema present.     Left lower leg: Edema present.     Comments: 1+ bilaterally  Skin:    General: Skin is warm and dry.  Neurological:     Mental Status: She is alert and oriented to person, place, and time.  Psychiatric:        Mood and Affect: Mood normal.        Behavior: Behavior normal.        Thought Content: Thought content normal.        Judgment: Judgment normal.     No results found for any visits on 10/21/22.      Assessment & Plan:   Problem List Items Addressed This Visit       Active Problems   Chronic combined systolic  and diastolic CHF (congestive heart failure) (HCC) - Primary    Glad you are feeling better! Continue lasix as prescribed by cardiology. We will check your kidney function today and let you know if we need to change anything Recommend low sodium diet, no more than 2L of fluids to drink in a day, elevate legs as much as possible during the day, try compression socks if you can tolerate.  Per cardiology order, take an extra lasix if you cain 3 pounds or more in one day.  Keep regular follow-up appointments with specialists and PCP.  You are due for your repeat chest xray (follow-up on pneumonia) at the end of October.       Relevant Orders   Basic metabolic panel    No orders of the defined types were placed in this encounter.   Return if symptoms worsen or fail to improve.  Clayborne Dana, NP

## 2022-10-21 NOTE — Telephone Encounter (Signed)
-----   Message from Nurse Kinnie Feil C sent at 10/21/2022  8:12 AM EDT -----  ----- Message ----- From: Quintella Reichert, MD Sent: 10/20/2022  10:16 PM EDT To: [name], MD; [facility] Triage  No ischemia on stress testing

## 2022-10-21 NOTE — Assessment & Plan Note (Signed)
Glad you are feeling better! Continue lasix as prescribed by cardiology. We will check your kidney function today and let you know if we need to change anything Recommend low sodium diet, no more than 2L of fluids to drink in a day, elevate legs as much as possible during the day, try compression socks if you can tolerate.  Per cardiology order, take an extra lasix if you cain 3 pounds or more in one day.  Keep regular follow-up appointments with specialists and PCP.  You are due for your repeat chest xray (follow-up on pneumonia) at the end of October.

## 2022-10-23 ENCOUNTER — Inpatient Hospital Stay: Payer: Medicare HMO | Attending: Family Medicine | Admitting: Hematology & Oncology

## 2022-10-23 ENCOUNTER — Other Ambulatory Visit: Payer: Self-pay

## 2022-10-23 ENCOUNTER — Inpatient Hospital Stay: Payer: Medicare HMO

## 2022-10-23 VITALS — BP 126/51 | HR 87 | Temp 97.8°F | Resp 16 | Ht 65.0 in | Wt 176.0 lb

## 2022-10-23 DIAGNOSIS — N183 Chronic kidney disease, stage 3 unspecified: Secondary | ICD-10-CM | POA: Diagnosis not present

## 2022-10-23 DIAGNOSIS — Z79899 Other long term (current) drug therapy: Secondary | ICD-10-CM | POA: Insufficient documentation

## 2022-10-23 DIAGNOSIS — D509 Iron deficiency anemia, unspecified: Secondary | ICD-10-CM | POA: Diagnosis not present

## 2022-10-23 DIAGNOSIS — D631 Anemia in chronic kidney disease: Secondary | ICD-10-CM

## 2022-10-23 DIAGNOSIS — N184 Chronic kidney disease, stage 4 (severe): Secondary | ICD-10-CM | POA: Diagnosis not present

## 2022-10-23 LAB — CMP (CANCER CENTER ONLY)
ALT: 9 U/L (ref 0–44)
AST: 14 U/L — ABNORMAL LOW (ref 15–41)
Albumin: 4 g/dL (ref 3.5–5.0)
Alkaline Phosphatase: 39 U/L (ref 38–126)
Anion gap: 7 (ref 5–15)
BUN: 36 mg/dL — ABNORMAL HIGH (ref 8–23)
CO2: 33 mmol/L — ABNORMAL HIGH (ref 22–32)
Calcium: 9.6 mg/dL (ref 8.9–10.3)
Chloride: 103 mmol/L (ref 98–111)
Creatinine: 1.67 mg/dL — ABNORMAL HIGH (ref 0.44–1.00)
GFR, Estimated: 31 mL/min — ABNORMAL LOW (ref >60)
Glucose, Bld: 136 mg/dL — ABNORMAL HIGH (ref 70–99)
Potassium: 4.2 mmol/L (ref 3.5–5.1)
Sodium: 143 mmol/L (ref 135–145)
Total Bilirubin: 0.6 mg/dL (ref 0.3–1.2)
Total Protein: 6.6 g/dL (ref 6.5–8.1)

## 2022-10-23 LAB — CBC WITH DIFFERENTIAL (CANCER CENTER ONLY)
Abs Immature Granulocytes: 0.06 10*3/uL (ref 0.00–0.07)
Basophils Absolute: 0 10*3/uL (ref 0.0–0.1)
Basophils Relative: 1 %
Eosinophils Absolute: 0.2 10*3/uL (ref 0.0–0.5)
Eosinophils Relative: 3 %
HCT: 31.4 % — ABNORMAL LOW (ref 36.0–46.0)
Hemoglobin: 9.6 g/dL — ABNORMAL LOW (ref 12.0–15.0)
Immature Granulocytes: 1 %
Lymphocytes Relative: 13 %
Lymphs Abs: 0.7 10*3/uL (ref 0.7–4.0)
MCH: 31 pg (ref 26.0–34.0)
MCHC: 30.6 g/dL (ref 30.0–36.0)
MCV: 101.3 fL — ABNORMAL HIGH (ref 80.0–100.0)
Monocytes Absolute: 0.5 10*3/uL (ref 0.1–1.0)
Monocytes Relative: 9 %
Neutro Abs: 4 10*3/uL (ref 1.7–7.7)
Neutrophils Relative %: 73 %
Platelet Count: 116 10*3/uL — ABNORMAL LOW (ref 150–400)
RBC: 3.1 MIL/uL — ABNORMAL LOW (ref 3.87–5.11)
RDW: 14.7 % (ref 11.5–15.5)
WBC Count: 5.4 10*3/uL (ref 4.0–10.5)
nRBC: 0 % (ref 0.0–0.2)

## 2022-10-23 LAB — RETICULOCYTES
Immature Retic Fract: 10.2 % (ref 2.3–15.9)
RBC.: 3.1 MIL/uL — ABNORMAL LOW (ref 3.87–5.11)
Retic Count, Absolute: 29.8 10*3/uL (ref 19.0–186.0)
Retic Ct Pct: 1 % (ref 0.4–3.1)

## 2022-10-23 LAB — FERRITIN: Ferritin: 397 ng/mL — ABNORMAL HIGH (ref 11–307)

## 2022-10-23 MED ORDER — EPOETIN ALFA-EPBX 40000 UNIT/ML IJ SOLN
40000.0000 [IU] | Freq: Once | INTRAMUSCULAR | Status: AC
  Administered 2022-10-23: 40000 [IU] via SUBCUTANEOUS
  Filled 2022-10-23: qty 1

## 2022-10-23 NOTE — Patient Instructions (Signed)

## 2022-10-23 NOTE — Progress Notes (Signed)
Hematology and Oncology Follow Up Visit  [name] [id number] 1940/02/01 82 y.o. 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  She was last seen back in July.  She comes I think monthly.  Her hemoglobin is dropped.  I think we will have to get her in more often so that we can make sure that her hemoglobin gets up so that she can enjoy the [place].  She does have cardiac issues.  She does see Cardiology.  She is wearing oxygen. \ Her last iron studies that were done back in June showed a ferritin of 310 with an iron saturation of 37%.  Thankfully, she has had no problems with COVID.  There is been no change in bowel or bladder habits.  She has had no bright red blood per rectum or melena.  She has had no issues with fever.  She has had no cough.  Again she has a chronic shortness of breath.  Overall, I would say performance status is probably ECOG 2.   Medications:  Allergies as of 10/23/2022       Reactions   Montelukast Sodium Palpitations, Other (See Comments)   REACTION: HEART PALPITATIONS, CHEST PAIN.   Sulfa Antibiotics Other (See Comments)   CHEST PAIN   Sulfonamide Derivatives Other (See Comments)   CHEST PAIN   Ace Inhibitors Other (See Comments)   PT. STATED UNKNOWN REACTION   Indomethacin Other (See Comments)   PT. STATED UNKNOWN REACTION        Medication List        Accurate as of October 23, 2022  3:50 PM. If you have any questions, ask your nurse or doctor.          Accu-Chek Aviva Soln USE AS DIRECTED WITH GLUCOMETER   Accu-Chek Guide test strip Generic drug: glucose blood Check sugars daily and prn   Accu-Chek Guide w/Device Kit USE AS DIRECTED TO CHECK BLOOD SUGAR   Accu-Chek Softclix Lancets lancets Check sugars daily and prn    albuterol 108 (90 Base) MCG/ACT inhaler Commonly known as: VENTOLIN HFA Inhale 2 puffs into the lungs every 4 (four) hours as needed for wheezing or shortness of breath.   allopurinol 100 MG tablet Commonly known as: ZYLOPRIM TAKE 2 TABLETS ONE TIME DAILY   b complex vitamins tablet Take 1 tablet by mouth daily.   cyanocobalamin 1000 MCG tablet Commonly known as: VITAMIN B12 Take 1,000 mcg by mouth daily.   docusate sodium 100 MG capsule Commonly known as: COLACE Take 1 capsule (100 mg total) by mouth 2 (two) times daily.   DropSafe Alcohol Prep 70 % Pads USE AS DIRECTED  BEFORE  CHECKING BLOOD SUGAR   fluticasone 50 MCG/ACT nasal spray Commonly known as: FLONASE Place 2 sprays into both nostrils daily.   furosemide 40 MG tablet Commonly known as: LASIX TAKE 1 TAB DAILY , TAKE 2ND TAB DAILY AS NEEDED FOR INCREASED EDEMA OR WEIGHT GAIN OVER 3 LBS/24 HRS   ipratropium-albuterol 0.5-2.5 (3) MG/3ML Soln Commonly known as: DUONEB Take 3 mLs by nebulization every 4 (four) hours as needed.   loratadine 10 MG tablet Commonly known as: CLARITIN Take 1 tablet (10 mg total) by mouth at bedtime. What changed:  when to take this reasons  to take this   metoprolol succinate 50 MG 24 hr tablet Commonly known as: TOPROL-XL TAKE 1 TABLET EVERY DAY. TAKE WITH OR IMMEDIATELY FOLLOWING A MEAL   OXYGEN Inhale 2 L into the lungs continuous.   pantoprazole 40 MG tablet Commonly known as: PROTONIX TAKE 1 TABLET EVERY DAY   polyethylene glycol 17 g packet Commonly known as: MIRALAX / GLYCOLAX Take 17 g by mouth daily.   pravastatin 40 MG tablet Commonly known as: PRAVACHOL TAKE 1 TABLET EVERY DAY   PROBIOTIC PO Take 1 tablet by mouth daily.   Prolia 60 MG/ML Sosy injection Generic drug: denosumab To be given in office.  Pt has appt 08/15/22.  Dx code: M81.0   Symbicort 160-4.5 MCG/ACT inhaler Generic drug: budesonide-formoterol INHALE 2 PUFFS TWICE DAILY IN THE MORNING AND  AT BEDTIME   traMADol 50 MG tablet Commonly known as: ULTRAM TAKE 1 TABLET BY MOUTH EVERY 6 HOURS AS NEEDED   trolamine salicylate 10 % cream Commonly known as: ASPERCREME Apply 1 application topically as needed for muscle pain.        Allergies:  Allergies  Allergen Reactions   Montelukast Sodium Palpitations and Other (See Comments)    REACTION: HEART PALPITATIONS, CHEST PAIN.   Sulfa Antibiotics Other (See Comments)    CHEST PAIN   Sulfonamide Derivatives Other (See Comments)    CHEST PAIN   Ace Inhibitors Other (See Comments)    PT. STATED UNKNOWN REACTION   Indomethacin Other (See Comments)    PT. STATED UNKNOWN REACTION    Past Medical History, Surgical history, Social history, and Family History were reviewed and updated.  Review of Systems: All other 10 point review of systems is negative.   Physical Exam:  height is 5\' 5"  (1.651 m) and weight is 176 lb (79.8 kg). Her oral temperature is 97.8 F (36.6 C). Her blood pressure is 126/51 (abnormal) and her pulse is 87. Her respiration is 16 and oxygen saturation is 100%.   Wt Readings from Last 3 Encounters:  10/23/22 176 lb (79.8 kg)  10/21/22 177 lb (80.3 kg)  10/20/22 178 lb (80.7 kg)    Ocular: Sclerae unicteric, pupils equal, round and reactive to light Ear-nose-throat: Oropharynx clear, dentition fair Lymphatic: No cervical or supraclavicular adenopathy Lungs no rales or rhonchi, good excursion bilaterally Heart regular rate and rhythm, no murmur appreciated Abd soft, nontender, positive bowel sounds MSK no focal spinal tenderness, no joint edema Neuro: non-focal, well-oriented, appropriate affect Breasts: Deferred   Lab Results  Component Value Date   WBC 5.4 10/23/2022   HGB 9.6 (L) 10/23/2022   HCT 31.4 (L) 10/23/2022   MCV 101.3 (H) 10/23/2022   PLT 116 (L) 10/23/2022   Lab Results  Component Value Date   FERRITIN 310 (H) 06/23/2022   IRON 92 06/23/2022   TIBC 251 06/23/2022   UIBC 159  06/23/2022   IRONPCTSAT 37 (H) 06/23/2022   Lab Results  Component Value Date   RETICCTPCT 1.0 10/23/2022   RBC 3.10 (L) 10/23/2022   RBC 3.10 (L) 10/23/2022   No results found for: "KPAFRELGTCHN", "LAMBDASER", "KAPLAMBRATIO" No results found for: "IGGSERUM", "IGA", "IGMSERUM" No results found for: "TOTALPROTELP", "ALBUMINELP", "A1GS", "A2GS", "BETS", "BETA2SER", "GAMS", "MSPIKE", "SPEI"   Chemistry      Component Value Date/Time   NA 143 10/23/2022 1456   NA 142 09/24/2022 1118   K 4.2 10/23/2022 1456   CL 103 10/23/2022 1456   CO2 33 (H) 10/23/2022 1456   BUN 36 (H)  10/23/2022 1456   BUN 52 (H) 09/24/2022 1118   CREATININE 1.67 (H) 10/23/2022 1456   CREATININE 1.36 (H) 08/23/2013 1134      Component Value Date/Time   CALCIUM 9.6 10/23/2022 1456   ALKPHOS 39 10/23/2022 1456   AST 14 (L) 10/23/2022 1456   ALT 9 10/23/2022 1456   BILITOT 0.6 10/23/2022 1456       Impression and Plan: [name] is a very pleasant 82 yo caucasian female with multifactorial anemia.   She will get her Retacrit today.  Will see what her iron studies look like.  We clearly have to get her Retacrit more often.  I will have her come back every 2 weeks.  I really want to get her hemoglobin above 11 if I can so that she can enjoy the holidays.  I would like to see her be able to enjoy the holiday season.  We will have her come back to see [place] in 6 weeks.  Again, she will come in every 2 weeks for labs and Retacrit.  Josph Macho, MD 10/3/20243:50 PM

## 2022-10-24 LAB — IRON AND IRON BINDING CAPACITY (CC-WL,HP ONLY)
Iron: 63 ug/dL (ref 28–170)
Saturation Ratios: 25 % (ref 10.4–31.8)
TIBC: 256 ug/dL (ref 250–450)
UIBC: 193 ug/dL (ref 148–442)

## 2022-10-26 ENCOUNTER — Other Ambulatory Visit: Payer: Self-pay | Admitting: Cardiology

## 2022-10-30 ENCOUNTER — Ambulatory Visit: Payer: Medicare HMO

## 2022-10-30 VITALS — Ht 65.0 in | Wt 170.0 lb

## 2022-10-30 DIAGNOSIS — Z1231 Encounter for screening mammogram for malignant neoplasm of breast: Secondary | ICD-10-CM

## 2022-10-30 DIAGNOSIS — Z780 Asymptomatic menopausal state: Secondary | ICD-10-CM | POA: Diagnosis not present

## 2022-10-30 DIAGNOSIS — Z0000 Encounter for general adult medical examination without abnormal findings: Secondary | ICD-10-CM

## 2022-10-30 NOTE — Patient Instructions (Addendum)
[name] , Thank you for taking time to come for your Medicare Wellness Visit. I appreciate your ongoing commitment to your health goals. Please review the following plan we discussed and let me know if I can assist you in the future.   Referrals/Orders/Follow-Ups/Clinician Recommendations: I have placed an order for a mammogram and bone density test. Call [facility] @ [phone] to schedule these at your earliest convenience.  This is a list of the screening recommended for you and due dates:  Health Maintenance  Topic Date Due   Colon Cancer Screening  11/10/2018   Mammogram  09/11/2021   Zoster (Shingles) Vaccine (2 of 2) 06/16/2022   Hemoglobin A1C  07/05/2022   COVID-19 Vaccine (4 - 2023-24 season) 09/21/2022   Yearly kidney health urinalysis for diabetes  01/04/2023   Complete foot exam   01/04/2023   Eye exam for diabetics  06/07/2023   Yearly kidney function blood test for diabetes  10/23/2023   Medicare Annual Wellness Visit  10/30/2023   DTaP/Tdap/Td vaccine (3 - Tdap) 07/26/2030   Pneumonia Vaccine  Completed   Flu Shot  Completed   DEXA scan (bone density measurement)  Completed   HPV Vaccine  Aged Out   Hepatitis C Screening  Discontinued    Advanced directives: (ACP Link)Information on Advanced Care Planning can be found at [facility] of [place] Advance Health Care Directives Advance Health Care Directives ([email or web address]   Please bring a copy of your health care power of attorney and living will to the office to be added to your chart at your convenience.   Next Medicare Annual Wellness Visit scheduled for next year: Yes, 11/03/23 @ 2:20pm  Managing Pain Without Opioids Opioids are strong medicines used to treat moderate to severe pain. For some people, especially those who have long-term (chronic) pain, opioids may not be the best choice for pain management due to: Side effects like nausea, constipation, and sleepiness. The risk of  addiction (opioid use disorder). The longer you take opioids, the greater your risk of addiction. Pain that lasts for more than 3 months is called chronic pain. Managing chronic pain usually requires more than one approach and is often provided by a team of health care providers working together (multidisciplinary approach). Pain management may be done at a pain management center or pain clinic. How to manage pain without the use of opioids Use non-opioid medicines Non-opioid medicines for pain may include: Over-the-counter or prescription non-steroidal anti-inflammatory drugs (NSAIDs). These may be the first medicines used for pain. They work well for muscle and bone pain, and they reduce swelling. Acetaminophen. This over-the-counter medicine may work well for milder pain but not swelling. Antidepressants. These may be used to treat chronic pain. A certain type of antidepressant (tricyclics) is often used. These medicines are given in lower doses for pain than when used for depression. Anticonvulsants. These are usually used to treat seizures but may also reduce nerve (neuropathic) pain. Muscle relaxants. These relieve pain caused by sudden muscle tightening (spasms). You may also use a pain medicine that is applied to the skin as a patch, cream, or gel (topical analgesic), such as a numbing medicine. These may cause fewer side effects than medicines taken by mouth. Do certain therapies as directed Some therapies can help with pain management. They include: Physical therapy. You will do exercises to gain strength and flexibility. A physical therapist may teach you exercises to move and stretch parts of your body that are  weak, stiff, or painful. You can learn these exercises at physical therapy visits and practice them at home. Physical therapy may also involve: Massage. Heat wraps or applying heat or cold to affected areas. Electrical signals that interrupt pain signals (transcutaneous electrical  nerve stimulation, TENS). Weak lasers that reduce pain and swelling (low-level laser therapy). Signals from your body that help you learn to regulate pain (biofeedback). Occupational therapy. This helps you to learn ways to function at home and work with less pain. Recreational therapy. This involves trying new activities or hobbies, such as a physical activity or drawing. Mental health therapy, including: Cognitive behavioral therapy (CBT). This helps you learn coping skills for dealing with pain. Acceptance and commitment therapy (ACT) to change the way you think and react to pain. Relaxation therapies, including muscle relaxation exercises and mindfulness-based stress reduction. Pain management counseling. This may be individual, family, or group counseling.  Receive medical treatments Medical treatments for pain management include: Nerve block injections. These may include a pain blocker and anti-inflammatory medicines. You may have injections: Near the spine to relieve chronic back or neck pain. Into joints to relieve back or joint pain. Into nerve areas that supply a painful area to relieve body pain. Into muscles (trigger point injections) to relieve some painful muscle conditions. A medical device placed near your spine to help block pain signals and relieve nerve pain or chronic back pain (spinal cord stimulation device). Acupuncture. Follow these instructions at home Medicines Take over-the-counter and prescription medicines only as told by your health care provider. If you are taking pain medicine, ask your health care providers about possible side effects to watch out for. Do not drive or use heavy machinery while taking prescription opioid pain medicine. Lifestyle  Do not use drugs or alcohol to reduce pain. If you drink alcohol, limit how much you have to: 0-1 drink a day for women who are not pregnant. 0-2 drinks a day for men. Know how much alcohol is in a drink. In the  [place] one drink equals one 12 oz bottle of beer (355 mL), one 5 oz glass of wine (148 mL), or one 1 oz glass of hard liquor (44 mL). Do not use any products that contain nicotine or tobacco. These products include cigarettes, chewing tobacco, and vaping devices, such as e-cigarettes. If you need help quitting, ask your health care provider. Eat a healthy diet and maintain a healthy weight. Poor diet and excess weight may make pain worse. Eat foods that are high in fiber. These include fresh fruits and vegetables, whole grains, and beans. Limit foods that are high in fat and processed sugars, such as fried and sweet foods. Exercise regularly. Exercise lowers stress and may help relieve pain. Ask your health care provider what activities and exercises are safe for you. If your health care provider approves, join an exercise class that combines movement and stress reduction. Examples include yoga and tai chi. Get enough sleep. Lack of sleep may make pain worse. Lower stress as much as possible. Practice stress reduction techniques as told by your therapist. General instructions Work with all your pain management providers to find the treatments that work best for you. You are an important member of your pain management team. There are many things you can do to reduce pain on your own. Consider joining an online or in-person support group for people who have chronic pain. Keep all follow-up visits. This is important. Where to find more information You can find more  information about managing pain without opioids from: American Academy of Pain Medicine: painmed.org Institute for Chronic Pain: instituteforchronicpain.org American Chronic Pain Association: theacpa.org Contact a health care provider if: You have side effects from pain medicine. Your pain gets worse or does not get better with treatments or home therapy. You are struggling with anxiety or depression. Summary Many types of pain can be  managed without opioids. Chronic pain may respond better to pain management without opioids. Pain is best managed when you and a team of health care providers work together. Pain management without opioids may include non-opioid medicines, medical treatments, physical therapy, mental health therapy, and lifestyle changes. Tell your health care providers if your pain gets worse or is not being managed well enough. This information is not intended to replace advice given to you by your health care provider. Make sure you discuss any questions you have with your health care provider. Document Revised: 04/18/2020 Document Reviewed: 04/18/2020 Elsevier Patient Education  2024 [employer].  Fall Prevention in the Home, Adult Falls can cause injuries and affect people of all ages. There are many simple things that you can do to make your home safe and to help prevent falls. If you need it, ask for help making these changes. What actions can I take to prevent falls? General information Use good lighting in all rooms. Make sure to: Replace any light bulbs that burn out. Turn on lights if it is dark and use night-lights. Keep items that you use often in easy-to-reach places. Lower the shelves around your home if needed. Move furniture so that there are clear paths around it. Do not keep throw rugs or other things on the floor that can make you trip. If any of your floors are uneven, fix them. Add color or contrast paint or tape to clearly mark and help you see: Grab bars or handrails. First and last steps of staircases. Where the edge of each step is. If you use a ladder or stepladder: Make sure that it is fully opened. Do not climb a closed ladder. Make sure the sides of the ladder are locked in place. Have someone hold the ladder while you use it. Know where your pets are as you move through your home. What can I do in the bathroom?     Keep the floor dry. Clean up any water that is on the  floor right away. Remove soap buildup in the bathtub or shower. Buildup makes bathtubs and showers slippery. Use non-skid mats or decals on the floor of the bathtub or shower. Attach bath mats securely with double-sided, non-slip rug tape. If you need to sit down while you are in the shower, use a non-slip stool. Install grab bars by the toilet and in the bathtub and shower. Do not use towel bars as grab bars. What can I do in the bedroom? Make sure that you have a light by your bed that is easy to reach. Do not use any sheets or blankets on your bed that hang to the floor. Have a firm bench or chair with side arms that you can use for support when you get dressed. What can I do in the kitchen? Clean up any spills right away. If you need to reach something above you, use a sturdy step stool that has a grab bar. Keep electrical cables out of the way. Do not use floor polish or wax that makes floors slippery. What can I do with my stairs? Do not leave  anything on the stairs. Make sure that you have a light switch at the top and the bottom of the stairs. Have them installed if you do not have them. Make sure that there are handrails on both sides of the stairs. Fix handrails that are broken or loose. Make sure that handrails are as long as the staircases. Install non-slip stair treads on all stairs in your home if they do not have carpet. Avoid having throw rugs at the top or bottom of stairs, or secure the rugs with carpet tape to prevent them from moving. Choose a carpet design that does not hide the edge of steps on the stairs. Make sure that carpet is firmly attached to the stairs. Fix any carpet that is loose or worn. What can I do on the outside of my home? Use bright outdoor lighting. Repair the edges of walkways and driveways and fix any cracks. Clear paths of anything that can make you trip, such as tools or rocks. Add color or contrast paint or tape to clearly mark and help you see  high doorway thresholds. Trim any bushes or trees on the main path into your home. Check that handrails are securely fastened and in good repair. Both sides of all steps should have handrails. Install guardrails along the edges of any raised decks or porches. Have leaves, snow, and ice cleared regularly. Use sand, salt, or ice melt on walkways during winter months if you live where there is ice and snow. In the garage, clean up any spills right away, including grease or oil spills. What other actions can I take? Review your medicines with your health care provider. Some medicines can make you confused or feel dizzy. This can increase your chance of falling. Wear closed-toe shoes that fit well and support your feet. Wear shoes that have rubber soles and low heels. Use a cane, walker, scooter, or crutches that help you move around if needed. Talk with your provider about other ways that you can decrease your risk of falls. This may include seeing a physical therapist to learn to do exercises to improve movement and strength. Where to find more information Centers for Disease Control and Prevention, [employer]: TonerPromos.no [employer] on Aging: BaseRingTones.pl National Institute on Aging: BaseRingTones.pl Contact a health care provider if: You are afraid of falling at home. You feel weak, drowsy, or dizzy at home. You fall at home. Get help right away if you: Lose consciousness or have trouble moving after a fall. Have a fall that causes a head injury. These symptoms may be an emergency. Get help right away. Call 911. Do not wait to see if the symptoms will go away. Do not drive yourself to the hospital. This information is not intended to replace advice given to you by your health care provider. Make sure you discuss any questions you have with your health care provider. Document Revised: 09/09/2021 Document Reviewed: 09/09/2021 Elsevier Patient Education  2024 [employer].

## 2022-10-30 NOTE — Progress Notes (Signed)
Subjective:   [name] is a 82 y.o. female who presents for Medicare Annual (Subsequent) preventive examination.  Visit Complete: Virtual I connected with  [name] on 10/30/22 by a audio enabled telemedicine application and verified that I am speaking with the correct person using two identifiers.  Patient Location: Home  Provider Location: Home Office  I discussed the limitations of evaluation and management by telemedicine. The patient expressed understanding and agreed to proceed.  Vital Signs: Because this visit was a virtual/telehealth visit, some criteria may be missing or patient reported. Any vitals not documented were not able to be obtained and vitals that have been documented are patient reported.   Cardiac Risk Factors include: advanced age (>67men, >21 women);diabetes mellitus;dyslipidemia;hypertension     Objective:    Today's Vitals   10/30/22 1421  Weight: 170 lb (77.1 kg)  Height: 5\' 5"  (1.651 m)   Body mass index is 28.29 kg/m.     10/30/2022    2:38 PM 10/23/2022    3:10 PM 09/03/2022   12:27 PM 07/21/2022   10:56 AM 06/23/2022   11:52 AM 04/21/2022   11:33 AM 12/26/2021   10:17 AM  Advanced Directives  Does Patient Have a Medical Advance Directive? Yes Yes Yes Yes Yes Yes Yes  Type of Best boy of [place];Living will Healthcare Power of [place];Living will Healthcare Power of [place];Living will Healthcare Power of [place];Living will Healthcare Power of [place];Living will  Does patient want to make changes to medical advance directive? Yes (MAU/Ambulatory/Procedural Areas - Information given) No - Patient declined No - Patient declined  No - Patient declined No - Patient declined No - Patient declined  Copy of Healthcare Power of Attorney in Chart?   No - copy requested No - copy requested No - copy requested No - copy requested No - copy requested  Would patient like information on creating a medical advance  directive?   No - Patient declined No - Patient declined No - Patient declined No - Patient declined No - Patient declined    Current Medications (verified) Outpatient Encounter Medications as of 10/30/2022  Medication Sig   Accu-Chek Softclix Lancets lancets Check sugars daily and prn   albuterol (VENTOLIN HFA) 108 (90 Base) MCG/ACT inhaler Inhale 2 puffs into the lungs every 4 (four) hours as needed for wheezing or shortness of breath.   Alcohol Swabs (DROPSAFE ALCOHOL PREP) 70 % PADS USE AS DIRECTED  BEFORE  CHECKING BLOOD SUGAR   allopurinol (ZYLOPRIM) 100 MG tablet TAKE 2 TABLETS ONE TIME DAILY   Blood Glucose Calibration (ACCU-CHEK AVIVA) SOLN USE AS DIRECTED WITH GLUCOMETER   Blood Glucose Monitoring Suppl (ACCU-CHEK GUIDE) w/Device KIT USE AS DIRECTED TO CHECK BLOOD SUGAR   denosumab (PROLIA) 60 MG/ML SOSY injection To be given in office.  Pt has appt 08/15/22.  Dx code: M81.0   fluticasone (FLONASE) 50 MCG/ACT nasal spray Place 2 sprays into both nostrils daily.   furosemide (LASIX) 40 MG tablet TAKE 1 TABLET DAILY , TAKE SECOND TABLET DAILY AS NEEDED FOR INCREASED EDEMA OR WEIGHT GAIN OVER 3 LBS/24 HRS   glucose blood (ACCU-CHEK GUIDE) test strip Check sugars daily and prn   ipratropium-albuterol (DUONEB) 0.5-2.5 (3) MG/3ML SOLN Take 3 mLs by nebulization every 4 (four) hours as needed.   loratadine (CLARITIN) 10 MG tablet Take 1 tablet (10 mg total) by mouth at bedtime. (Patient taking differently: Take 10 mg by mouth at bedtime as needed for  allergies.)   metoprolol succinate (TOPROL-XL) 25 MG 24 hr tablet Take 12.5 mg by mouth daily.   OXYGEN Inhale 2 L into the lungs continuous.    pantoprazole (PROTONIX) 40 MG tablet TAKE 1 TABLET EVERY DAY   polyethylene glycol (MIRALAX / GLYCOLAX) 17 g packet Take 17 g by mouth daily.   pravastatin (PRAVACHOL) 40 MG tablet TAKE 1 TABLET EVERY DAY   Probiotic Product (PROBIOTIC PO) Take 1 tablet by mouth daily.    SYMBICORT 160-4.5 MCG/ACT  inhaler INHALE 2 PUFFS TWICE DAILY IN THE MORNING AND AT BEDTIME   traMADol (ULTRAM) 50 MG tablet TAKE 1 TABLET BY MOUTH EVERY 6 HOURS AS NEEDED   trolamine salicylate (ASPERCREME) 10 % cream Apply 1 application topically as needed for muscle pain.   vitamin B-12 (CYANOCOBALAMIN) 1000 MCG tablet Take 1,000 mcg by mouth daily.    b complex vitamins tablet Take 1 tablet by mouth daily. (Patient not taking: Reported on 10/30/2022)   docusate sodium (COLACE) 100 MG capsule Take 1 capsule (100 mg total) by mouth 2 (two) times daily. (Patient not taking: Reported on 10/30/2022)   [DISCONTINUED] metoprolol succinate (TOPROL-XL) 50 MG 24 hr tablet TAKE 1 TABLET EVERY DAY. TAKE WITH OR IMMEDIATELY FOLLOWING A MEAL (Patient not taking: Reported on 10/30/2022)   No facility-administered encounter medications on file as of 10/30/2022.    Allergies (verified) Montelukast sodium, Sulfa antibiotics, Sulfonamide derivatives, Ace inhibitors, and Indomethacin   History: Past Medical History:  Diagnosis Date   Abnormal glucose tolerance test    Abnormal TSH 09/22/2016   ACE-inhibitor cough    Anemia 03/12/2014   Arthritis    Asthma    PFT 02/06/09 FEV1 1.41 (65%), FVC 1.92 (64), FEV1% 74, TLC 3.47 (71%), DLCO 48%, +BD   Atypical chest pain    s/p cath, Normal coronaries, Non ST elevation myocardial infarction, Rt groin pseudoaneurysm   Bacterial vaginosis 03/12/2014   Cellulitis 06/22/2016   Chronic kidney disease (CKD), stage III (moderate) (HCC) 08/04/2016   COPD (chronic obstructive pulmonary disease) (HCC)    Depression    Diastolic heart failure (HCC) 04/07/2016   DVT (deep venous thrombosis) (HCC) 1987   GERD (gastroesophageal reflux disease)    Gout    Hypercalcemia 10/15/2014   Hyperlipidemia    Hypertension    Hypoxia 10/15/2014   Macular degeneration 04/10/2015   Mitral stenosis    Moderate by echo 09/2022 with mean mitral valve gradient 7 mmHg   Osteopenia 12/29/2006   Qualifier:  Diagnosis of  By: Nena Jordan    Pneumonia    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   Renal insufficiency 09/22/2016   Unspecified constipation 06/05/2013   Vitamin D deficiency 01/01/2015   Past Surgical History:  Procedure Laterality Date   APPENDECTOMY  1951   TONSILLECTOMY  1950   TUBAL LIGATION  1968   Family History  Problem Relation Age of Onset   Asthma Sister    Hypertension Sister    Hyperlipidemia Sister    Uterine cancer Sister    Coronary artery disease Brother        x2   Arthritis Brother    Lung cancer Brother        smoker   Hypertension Sister    Arthritis Sister    Hyperlipidemia Sister    Emphysema Sister    COPD Sister        smoker   Heart disease Sister    Hyperlipidemia Sister    Hypertension Sister  Arthritis Sister    Emphysema Brother    Heart disease Brother         smoker   Heart attack Brother    Mental illness Father    Suicidality Father    Heart disease Mother    Hyperlipidemia Mother    Heart attack Mother    Epilepsy Daughter    Hypertension Daughter    Obesity Daughter    COPD Brother        smoker   Lung cancer Brother    Coronary artery disease Other    Colon polyps Sister    Social History   Socioeconomic History   Marital status: Widowed    Spouse name: Not on file   Number of children: 1   Years of education: Not on file   Highest education level: 12th grade  Occupational History   Occupation: Retired    Associate Professor: [employer]  Tobacco Use   Smoking status: Never    Passive exposure: Past   Smokeless tobacco: Never  Vaping Use   Vaping status: Never Used  Substance and Sexual Activity   Alcohol use: No   Drug use: No   Sexual activity: Never    Comment: lives alone, no dietary restrictions  Other Topics Concern   Not on file  Social History Narrative   Not on file   Social Determinants of Health   Financial Resource Strain: Low Risk  (10/30/2022)   Overall Financial Resource Strain  (CARDIA)    Difficulty of Paying Living Expenses: Not hard at all  Food Insecurity: No Food Insecurity (10/30/2022)   Hunger Vital Sign    Worried About Running Out of Food in the Last Year: Never true    Ran Out of Food in the Last Year: Never true  Transportation Needs: No Transportation Needs (10/30/2022)   PRAPARE - Administrator, Civil Service (Medical): No    Lack of Transportation (Non-Medical): No  Physical Activity: Sufficiently Active (10/30/2022)   Exercise Vital Sign    Days of Exercise per Week: 5 days    Minutes of Exercise per Session: 30 min  Stress: No Stress Concern Present (10/30/2022)   [employer] of Occupational Health - Occupational Stress Questionnaire    Feeling of Stress : Not at all  Social Connections: Moderately Isolated (10/30/2022)   Social Connection and Isolation Panel [NHANES]    Frequency of Communication with Friends and Family: More than three times a week    Frequency of Social Gatherings with Friends and Family: Once a week    Attends Religious Services: More than 4 times per year    Active Member of [employer] or Organizations: No    Attends Banker Meetings: Never    Marital Status: Widowed    Tobacco Counseling Counseling given: Not Answered   Clinical Intake:  Pre-visit preparation completed: Yes  Pain : No/denies pain     BMI - recorded: 28.29 Nutritional Status: BMI 25 -29 Overweight Nutritional Risks: None Diabetes: Yes (patient denies being diabetic) CBG done?: No Did pt. bring in CBG monitor from home?: No  How often do you need to have someone help you when you read instructions, pamphlets, or other written materials from your doctor or pharmacy?: 1 - Never  Interpreter Needed?: No  Information entered by :: Tora Kindred, CMA   Activities of Daily Living    10/30/2022    2:24 PM  In your present state of health, do you have any difficulty performing  the following activities:  Hearing?  1  Comment wears hearing aids  Vision? 0  Difficulty concentrating or making decisions? 1  Comment sometimes  Walking or climbing stairs? 1  Comment walker  Dressing or bathing? 0  Doing errands, shopping? 0  Preparing Food and eating ? N  Using the Toilet? N  In the past six months, have you accidently leaked urine? Y  Comment wears pad  Do you have problems with loss of bowel control? N  Managing your Medications? N  Managing your Finances? N  Housekeeping or managing your Housekeeping? Y  Comment daughter helps with vacuuming    Patient Care Team: [name], MD as PCP - General (Family Medicine) Quintella Reichert, MD as PCP - Cardiology (Cardiology) Hanley Seamen Dustin Folks, MD as Referring Physician (Optometry) Parrett, Virgel Bouquet, NP as Nurse Practitioner (Pulmonary Disease) Oretha Milch, MD as Consulting Physician (Pulmonary Disease) Terrial Rhodes, MD as Consulting Physician (Nephrology) Sallye Lat, MD as Consulting Physician (Ophthalmology)  Indicate any recent Medical Services you may have received from other than [facility] providers in the past year (date may be approximate).     Assessment:   This is a routine wellness examination for [place]vette.  Hearing/Vision screen Hearing Screening - Comments:: Wears hearing aids Vision Screening - Comments:: Gets routine eye exams   Goals Addressed               This Visit's Progress     Patient Stated (pt-stated)        Eat more fruits and vegetables and exercise more      Depression Screen    10/30/2022    2:35 PM 09/16/2022    1:17 PM 06/10/2022    3:40 PM 04/07/2022    2:51 PM 01/03/2022   11:03 AM 10/07/2021   11:15 AM 09/24/2021    2:00 PM  PHQ 2/9 Scores  PHQ - 2 Score 1 0 0 0 0 1 0  PHQ- 9 Score 1   0   0    Fall Risk    10/30/2022    2:39 PM 09/16/2022    1:17 PM 06/10/2022    3:40 PM 04/07/2022    2:51 PM 01/03/2022   11:03 AM  Fall Risk   Falls in the past year? 0 0 1 0 0  Number falls in  past yr: 0 0 1 0 0  Injury with Fall? 0 0 0 0 0  Risk for fall due to : Impaired mobility;Impaired balance/gait;Orthopedic patient    No Fall Risks  Follow up Falls prevention discussed;Education provided;Falls evaluation completed Falls evaluation completed Falls evaluation completed Falls evaluation completed Falls evaluation completed    MEDICARE RISK AT HOME: Medicare Risk at Home Any stairs in or around the home?: Yes If so, are there any without handrails?: No Home free of loose throw rugs in walkways, pet beds, electrical cords, etc?: Yes Adequate lighting in your home to reduce risk of falls?: Yes Life alert?: Yes Use of a cane, walker or w/c?: Yes (walker) Grab bars in the bathroom?: Yes Shower chair or bench in shower?: Yes Elevated toilet seat or a handicapped toilet?: Yes  TIMED UP AND GO:  Was the test performed?  No    Cognitive Function:    02/05/2017    1:22 PM 01/07/2016    2:24 PM  MMSE - Mini Mental State Exam  Orientation to time 5 4  Orientation to Place 5 5  Registration 3 3  Attention/  Calculation 5 5  Recall 3 3  Language- name 2 objects 2 2  Language- repeat 1 1  Language- follow 3 step command 3 3  Language- read & follow direction 1 1  Write a sentence 1 1  Copy design 1 0  Total score 30 28        10/30/2022    2:41 PM 10/07/2021   11:21 AM  6CIT Screen  What Year? 0 points 0 points  What month? 0 points 0 points  What time? 0 points 0 points  Count back from 20 0 points 0 points  Months in reverse 0 points 0 points  Repeat phrase 0 points 0 points  Total Score 0 points 0 points    Immunizations Immunization History  Administered Date(s) Administered   Fluad Quad(high Dose 65+) 11/11/2018, 11/18/2021   Fluad Trivalent(High Dose 65+) 09/16/2022   Influenza Split 12/08/2011   Influenza Whole 10/20/2006, 11/09/2007, 11/14/2008, 10/30/2009   Influenza, High Dose Seasonal PF 09/25/2015, 10/21/2015, 10/26/2017   Influenza,inj,Quad  PF,6+ Mos 10/20/2012, 11/28/2013, 10/02/2014, 09/18/2016   PFIZER(Purple Top)SARS-COV-2 Vaccination 03/19/2019, 05/03/2019, 01/04/2020   Pneumococcal Conjugate-13 02/21/2013   Pneumococcal Polysaccharide-23 06/30/2006   Td 06/01/2007, 07/25/2020   Zoster Recombinant(Shingrix) 04/21/2022    TDAP status: Up to date  Flu Vaccine status: Up to date  Pneumococcal vaccine status: Up to date  Covid-19 vaccine status: Information provided on how to obtain vaccines.   Qualifies for Shingles Vaccine? No   Zostavax completed No   Shingrix Completed?: No.    Education has been provided regarding the importance of this vaccine. Patient has been advised to call insurance company to determine out of pocket expense if they have not yet received this vaccine. Advised may also receive vaccine at local pharmacy or Health Dept. Verbalized acceptance and understanding.  Screening Tests Health Maintenance  Topic Date Due   Colonoscopy  11/10/2018   Zoster Vaccines- Shingrix (2 of 2) 06/16/2022   HEMOGLOBIN A1C  07/05/2022   COVID-19 Vaccine (4 - 2023-24 season) 09/21/2022   Diabetic kidney evaluation - Urine ACR  01/04/2023   FOOT EXAM  01/04/2023   OPHTHALMOLOGY EXAM  06/07/2023   Diabetic kidney evaluation - eGFR measurement  10/23/2023   Medicare Annual Wellness (AWV)  10/30/2023   DTaP/Tdap/Td (3 - Tdap) 07/26/2030   Pneumonia Vaccine 70+ Years old  Completed   INFLUENZA VACCINE  Completed   DEXA SCAN  Completed   HPV VACCINES  Aged Out   Hepatitis C Screening  Discontinued    Health Maintenance  Health Maintenance Due  Topic Date Due   Colonoscopy  11/10/2018   Zoster Vaccines- Shingrix (2 of 2) 06/16/2022   HEMOGLOBIN A1C  07/05/2022   COVID-19 Vaccine (4 - 2023-24 season) 09/21/2022    Colorectal cancer screening: No longer required.   Mammogram status: Completed 09/11/20. Repeat every year. Order placed.  Bone Density status: Completed 09/11/20. Results reflect: Bone density  results: OSTEOPOROSIS. Repeat every 2 years.  Lung Cancer Screening: (Low Dose CT Chest recommended if Age 18-80 years, 20 pack-year currently smoking OR have quit w/in 15years.) does not qualify.   Lung Cancer Screening Referral: n/a  Additional Screening:  Hepatitis C Screening: does not qualify; Completed 02/23/20  Vision Screening: Recommended annual ophthalmology exams for early detection of glaucoma and other disorders of the eye. Is the patient up to date with their annual eye exam?  Yes  Who is the provider or what is the name of the office in which the  patient attends annual eye exams? [facility] If pt is not established with a provider, would they like to be referred to a provider to establish care? No .   Dental Screening: Recommended annual dental exams for proper oral hygiene  Diabetic Foot Exam: Diabetic Foot Exam: Completed 01/03/22  Community Resource Referral / Chronic Care Management: CRR required this visit?  No   CCM required this visit?  No     Plan:     I have personally reviewed and noted the following in the patient's chart:   Medical and social history Use of alcohol, tobacco or illicit drugs  Current medications and supplements including opioid prescriptions. Patient is currently taking opioid prescriptions. Information provided to patient regarding non-opioid alternatives. Patient advised to discuss non-opioid treatment plan with their provider. Functional ability and status Nutritional status Physical activity Advanced directives List of other physicians Hospitalizations, surgeries, and ER visits in previous 12 months Vitals Screenings to include cognitive, depression, and falls Referrals and appointments  In addition, I have reviewed and discussed with patient certain preventive protocols, quality metrics, and best practice recommendations. A written personalized care plan for preventive services as well as general preventive health  recommendations were provided to patient.     Tora Kindred, CMA   10/30/2022   After Visit Summary: (MyChart) Due to this being a telephonic visit, the after visit summary with patients personalized plan was offered to patient via MyChart   Nurse Notes:  Placed orders for MMG and DEXA. Patient states she has had  both shingles vaccines and will bring in documentation.

## 2022-11-01 ENCOUNTER — Other Ambulatory Visit: Payer: Self-pay | Admitting: Family

## 2022-11-01 DIAGNOSIS — M549 Dorsalgia, unspecified: Secondary | ICD-10-CM

## 2022-11-03 ENCOUNTER — Ambulatory Visit (HOSPITAL_BASED_OUTPATIENT_CLINIC_OR_DEPARTMENT_OTHER)
Admission: RE | Admit: 2022-11-03 | Discharge: 2022-11-03 | Disposition: A | Discharge status: Home / Self Care | Payer: Medicare HMO | Source: Ambulatory Visit | Attending: Family Medicine | Admitting: Family Medicine

## 2022-11-03 ENCOUNTER — Encounter (HOSPITAL_BASED_OUTPATIENT_CLINIC_OR_DEPARTMENT_OTHER): Payer: Self-pay

## 2022-11-03 DIAGNOSIS — Z1231 Encounter for screening mammogram for malignant neoplasm of breast: Secondary | ICD-10-CM | POA: Diagnosis not present

## 2022-11-03 DIAGNOSIS — Z780 Asymptomatic menopausal state: Secondary | ICD-10-CM | POA: Diagnosis not present

## 2022-11-03 DIAGNOSIS — M810 Age-related osteoporosis without current pathological fracture: Secondary | ICD-10-CM | POA: Diagnosis not present

## 2022-11-05 ENCOUNTER — Other Ambulatory Visit: Payer: Self-pay | Admitting: Family Medicine

## 2022-11-11 ENCOUNTER — Inpatient Hospital Stay: Payer: Medicare HMO

## 2022-11-11 VITALS — BP 139/63 | HR 93 | Temp 97.8°F

## 2022-11-11 DIAGNOSIS — D631 Anemia in chronic kidney disease: Secondary | ICD-10-CM

## 2022-11-11 DIAGNOSIS — D509 Iron deficiency anemia, unspecified: Secondary | ICD-10-CM | POA: Diagnosis not present

## 2022-11-11 DIAGNOSIS — Z79899 Other long term (current) drug therapy: Secondary | ICD-10-CM | POA: Diagnosis not present

## 2022-11-11 DIAGNOSIS — N183 Chronic kidney disease, stage 3 unspecified: Secondary | ICD-10-CM | POA: Diagnosis not present

## 2022-11-11 LAB — CMP (CANCER CENTER ONLY)
ALT: 9 U/L (ref 0–44)
AST: 17 U/L (ref 15–41)
Albumin: 4 g/dL (ref 3.5–5.0)
Alkaline Phosphatase: 37 U/L — ABNORMAL LOW (ref 38–126)
Anion gap: 15 (ref 5–15)
BUN: 40 mg/dL — ABNORMAL HIGH (ref 8–23)
CO2: 26 mmol/L (ref 22–32)
Calcium: 9.9 mg/dL (ref 8.9–10.3)
Chloride: 101 mmol/L (ref 98–111)
Creatinine: 1.6 mg/dL — ABNORMAL HIGH (ref 0.44–1.00)
GFR, Estimated: 32 mL/min — ABNORMAL LOW (ref >60)
Glucose, Bld: 135 mg/dL — ABNORMAL HIGH (ref 70–99)
Potassium: 3.9 mmol/L (ref 3.5–5.1)
Sodium: 142 mmol/L (ref 135–145)
Total Bilirubin: 0.6 mg/dL (ref 0.3–1.2)
Total Protein: 6.7 g/dL (ref 6.5–8.1)

## 2022-11-11 LAB — CBC WITH DIFFERENTIAL (CANCER CENTER ONLY)
Abs Immature Granulocytes: 0.02 10*3/uL (ref 0.00–0.07)
Basophils Absolute: 0 10*3/uL (ref 0.0–0.1)
Basophils Relative: 1 %
Eosinophils Absolute: 0.1 10*3/uL (ref 0.0–0.5)
Eosinophils Relative: 2 %
HCT: 34.5 % — ABNORMAL LOW (ref 36.0–46.0)
Hemoglobin: 10.7 g/dL — ABNORMAL LOW (ref 12.0–15.0)
Immature Granulocytes: 0 %
Lymphocytes Relative: 16 %
Lymphs Abs: 0.9 10*3/uL (ref 0.7–4.0)
MCH: 31.1 pg (ref 26.0–34.0)
MCHC: 31 g/dL (ref 30.0–36.0)
MCV: 100.3 fL — ABNORMAL HIGH (ref 80.0–100.0)
Monocytes Absolute: 0.6 10*3/uL (ref 0.1–1.0)
Monocytes Relative: 10 %
Neutro Abs: 4.1 10*3/uL (ref 1.7–7.7)
Neutrophils Relative %: 71 %
Platelet Count: 124 10*3/uL — ABNORMAL LOW (ref 150–400)
RBC: 3.44 MIL/uL — ABNORMAL LOW (ref 3.87–5.11)
RDW: 14.8 % (ref 11.5–15.5)
WBC Count: 5.8 10*3/uL (ref 4.0–10.5)
nRBC: 0 % (ref 0.0–0.2)

## 2022-11-11 MED ORDER — EPOETIN ALFA-EPBX 40000 UNIT/ML IJ SOLN
40000.0000 [IU] | Freq: Once | INTRAMUSCULAR | Status: AC
  Administered 2022-11-11: 40000 [IU] via SUBCUTANEOUS
  Filled 2022-11-11: qty 1

## 2022-11-11 NOTE — Patient Instructions (Signed)

## 2022-11-15 ENCOUNTER — Other Ambulatory Visit: Payer: Self-pay | Admitting: Family

## 2022-11-15 DIAGNOSIS — M549 Dorsalgia, unspecified: Secondary | ICD-10-CM

## 2022-11-19 ENCOUNTER — Ambulatory Visit: Payer: Medicare HMO | Admitting: Internal Medicine

## 2022-11-19 ENCOUNTER — Encounter: Payer: Self-pay | Admitting: Internal Medicine

## 2022-11-19 ENCOUNTER — Ambulatory Visit: Payer: Medicare HMO

## 2022-11-19 VITALS — BP 122/73 | HR 98 | Temp 98.0°F | Ht 62.5 in | Wt 178.4 lb

## 2022-11-19 DIAGNOSIS — J4530 Mild persistent asthma, uncomplicated: Secondary | ICD-10-CM | POA: Diagnosis not present

## 2022-11-19 DIAGNOSIS — R918 Other nonspecific abnormal finding of lung field: Secondary | ICD-10-CM | POA: Diagnosis not present

## 2022-11-19 DIAGNOSIS — J9612 Chronic respiratory failure with hypercapnia: Secondary | ICD-10-CM | POA: Diagnosis not present

## 2022-11-19 DIAGNOSIS — R0609 Other forms of dyspnea: Secondary | ICD-10-CM | POA: Diagnosis not present

## 2022-11-19 DIAGNOSIS — J9611 Chronic respiratory failure with hypoxia: Secondary | ICD-10-CM

## 2022-11-19 NOTE — Assessment & Plan Note (Signed)
HCO3   02/18/2018   = 31  02/22/2018  Room Air at Rest = 88%  >>> while Ambulating = 87% and  on 3 Liters of oxygen while Ambulating = 94% - 03/22/2018   Walked 2lpm   1 laps @  approx 28ft each @ slow pace  stopped due to  Sob/desats resolved on 3lpm  And finished second lap s desats but stopped nonetheless with sob 11/11/2018  02 qualify Patient Saturations on Room Air at Rest = 100% andwhile Ambulating = 87% Patient Saturations on 2 Liters of oxygen while Ambulating = 94% - HC03  08/09/21  = 31  - 08/12/2021   Walked on 2lpm POC  x  2  lap(s) =  approx 300  ft  @ slow/rollator pace, stopped due to sob with lowest 02 sats  91%   Advised: Make sure you check your oxygen saturation  AT  your highest level of activity (not after you stop)   to be sure it stays over 90% and adjust  02 flow upward to maintain this level if needed but remember to turn it back to previous settings when you stop (to conserve your supply).   Return in 3 m with all meds in hand using a trust but verify approach to confirm accurate Medication  Reconciliation The principal here is that until we are certain that the  patients are doing what we've asked, it makes no sense to ask them to do more.          Each maintenance medication was reviewed in detail including emphasizing most importantly the difference between maintenance and prns and under what circumstances the prns are to be triggered using an action plan format where appropriate.  Total time for H and P, chart review, counseling, reviewing hfa/neb/02 / pulse ox  device(s) and generating customized AVS unique to this office visit / same day charting  > 30 min

## 2022-11-19 NOTE — Patient Instructions (Addendum)
Make sure you check your oxygen saturation  AT  your highest level of activity (not after you stop)   to be sure it stays over 90% and adjust  02 flow upward to maintain this level if needed but remember to turn it back to previous settings when you stop (to conserve your supply).    Plan A = Automatic = Always=    Symbicort 160 Take 2 puffs first thing in am and then another 2 puffs about 12 hours later.    Work on inhaler technique:  relax and gently blow all the way out then take a nice smooth full deep breath back in, triggering the inhaler at same time you start breathing in.  Hold breath in for at least  5 seconds if you can. Blow out symbicort  thru nose. Rinse and gargle with water when done.  If mouth or throat bother you at all,  try brushing teeth/gums/tongue with arm and hammer toothpaste/ make a slurry and gargle and spit out.      Plan B = Backup (to supplement plan A, not to replace it) Only use your albuterol inhaler as a rescue medication to be used if you can't catch your breath by resting or doing a relaxed purse lip breathing pattern.  - The less you use it, the better it will work when you need it. - Ok to use the inhaler up to 2 puffs  every 4 hours if you must but call for appointment if use goes up over your usual need - Don't leave home without it !!  (think of it like the spare tire for your car)   Plan C = Crisis (instead of Plan B but only if Plan B stops working) - only use your albuterol nebulizer if you first try Plan B and it fails to help > ok to use the nebulizer up to every 4 hours but if start needing it regularly call for immediate appointment   Also  Ok to try albuterol 15 min before an activity (on alternating days)  that you know would usually make you short of breath and see if it makes any difference and if makes none then don't take albuterol after activity unless you can't catch your breath as this means it's the resting that helps, not the albuterol.        Please remember to go to the  x-ray department  for your tests - we will call you with the results when they are available    Please schedule a follow up visit in 3 months but call sooner if needed with your inhalers / nebulizer solutions

## 2022-11-19 NOTE — Assessment & Plan Note (Signed)
Never smoker - Allergy profile 03/11/13  IgE 11 RAST  - Alpha one AT Screen 03/11/13  Level 167  PFT's  04/26/13   FEV1 1.47 (67 % ) ratio 0.77  p no % improvement from saba p nothing prior to study with DLCO  64 % corrects to 77 % for alv volume with mild curvature   - PFT's  03/22/2018  FEV1 1.11 (54 % ) ratio 0.70  p no % improvement from saba p neb saba 3 h  prior to study with DLCO  53 % corrects to 90 % for alv volume and ERV 16%    With mild curvature  - 02/22/2018   continue symbicort 160 2bid  - 11/18/2021  After extensive coaching inhaler device,  effectiveness =    50% (short ti, short breath hold)   Well compensated on symbicort but using too much saba   Re SABA :  I spent extra time with pt today reviewing appropriate use of albuterol for prn use on exertion with the following points: 1) saba is for relief of sob that does not improve by walking a slower pace or resting but rather if the pt does not improve after trying this first. 2) If the pt is convinced, as many are, that saba helps recover from activity faster then it's easy to tell if this is the case by re-challenging : ie stop, take the inhaler, then p 5 minutes try the exact same activity (intensity of workload) that just caused the symptoms and see if they are substantially diminished or not after saba 3) if there is an activity that reproducibly causes the symptoms, try the saba 15 min before the activity on alternate days   If in fact the saba really does help, then fine to continue to use it prn but advised may need to look closer at the maintenance regimen being used to achieve better control of airways disease with exertion.

## 2022-11-19 NOTE — Progress Notes (Signed)
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   History of Present Illness  02/22/2018  Pulmonary/ 1st office eval/[name]  Chief Complaint  Patient presents with   Consult    Shortness of breath  Dyspnea:  MMRC2 = can't walk a nl pace on a flat grade s sob but does fine slow and flat only a little better with 02  Cough: constant sense something stuck in throat but min mucoid production - worse with exp to smoke or strong scents  Sleep: 45 degrees with electric bed  - lower levels has more trouble breathng SABA use: increased use since d/c'd singulair  rec Plan A = Automatic = Symbicort 160 Take 2 puffs first thing in am and then another 2 puffs about 12 hours later.  Work on inhaler technique:     Plan B = Backup Only use your albuterol inhaler  Plan C = Crisis - only use your albuterol nebulizer if you first try Plan B and it fails to help > ok to use the nebulizer up to every 4 hours but if start needing it regularly call for immediate appointment Goal is to keep your 02 saturations above 90%  Please schedule a follow up office visit in 4 weeks, sooner if needed with pfts on return           08/12/2021  f/u ov/[name] re: chronic asthma/ 02 dep   maint on symbicort   Chief Complaint  Patient presents with   Follow-up    Pt states she is having problems breathing x1 month ago with exertion. Pt states she has been diagnosed with COPD and Asthma and Bronchitis.   Dyspnea:  sev hundred feet flat on 2lpm POC Cough: p neb clear  mucus  Sleeping: no problems 30 degrees electric bed  SABA use: 3-4 days p ex only - neb up to twice daily  02: 24/7 @  2lpm / on POC but not titrating  Covid status:   x  3  Rec Plan A = Automatic = Always=    symbicort 160 Take 2 puffs first thing in am and then another 2 puffs about 12 hours later.  Work on inhaler technique:  relax  Plan B = Backup (to supplement plan A, not to replace it) Only use your albuterol inhaler as a rescue medication Plan C = Crisis (instead of Plan B but only if Plan B stops working) - only use your duoneb per nebulizer if you first try Plan B Ok to try albuterol 15 min before an activity (on alternating days)  that you know would usually make you short of breath Make sure you check your oxygen saturation  AT  your highest level of activity (not after you stop)   to be sure it stays over 90%    Please schedule a follow up visit in 3 months but call sooner if needed  with all medications /inhalers/ solutions in hand     11/18/2021  f/u ov/[name] re: chronic asthma maint on symbicort 160 / suboptimal technique Dyspnea:  walking around house mostly / mailbox on 2 lpm does not check sats  Cough: min mucoid  Sleeping: 30 degrees hob electric  SABA use: 2-3 per week 02: 1lpm hs and up to 2pm with activity  Rec Work on inhaler technique:  Use the empty symbicort device to practice before using the real one Make sure you check your oxygen saturation  AT  your highest level of activity (NOT after you stop)   to be sure it stays over 90%  Also  Ok to try albuterol 15 min before an activity (on alternating days)    11/19/2022  f/u ov/[name] re: chronic asthma/ 02 dep / poor hfa  maint on symbicort 160   Chief Complaint  Patient presents with   Follow-up    Follow up for pneumonia.  Seen at Dr Reola Calkins at PCP.  Possible follow up CXR today.  Some chest congestion/trouble breathing at night.  Dyspnea:  mb and back using rollator 2lpm  Cough: better /swallowing ok   Sleeping: bed is 30 degrees s  resp cc  SABA use: neb once daily/ hfa up to 3 x daily   02: sleeps on 1  and up to 2 walking does not titrate      No obvious day to day or daytime variability or assoc excess/ purulent sputum or mucus plugs or hemoptysis or cp or chest tightness, subjective wheeze or overt sinus or hb symptoms.    Also denies any obvious fluctuation of symptoms with weather or environmental changes or other aggravating or alleviating factors except as outlined above   No unusual exposure hx or h/o childhood pna/ asthma or knowledge of premature birth.  Current Allergies, Complete Past Medical History, Past Surgical History, Family History, and Social History were reviewed in [employer] record.  ROS  The following are not active complaints unless bolded Hoarseness, sore throat, dysphagia, dental problems, itching, sneezing,  nasal congestion or discharge of excess mucus or purulent secretions, ear ache,   fever, chills, sweats, unintended wt loss or wt gain, classically pleuritic or exertional cp,  orthopnea pnd or arm/hand swelling  or leg swelling, presyncope, palpitations, abdominal pain, anorexia, nausea, vomiting, diarrhea  or change in bowel habits or change in bladder habits, change in stools or change in urine, dysuria, hematuria,  rash, arthralgias, visual complaints, headache, numbness, weakness or ataxia or problems with walking or coordination,  change in mood or  memory.                     Objective:    wts  11/19/2022       178  11/18/2021      184   08/12/2021        183  05/30/2019        180 11/11/2018      188   03/22/18 190 lb (86.2 kg)  02/22/18 197 lb (89.4 kg)  02/18/18 189 lb 1.9 oz (85.8 kg)      Vital signs reviewed  11/19/2022  - Note at rest 02 sats  93% on 1lpm  POC  General appearance:    elderly hoarse wf nad / rollator        HEENT : Oropharynx  clear / top dentures       NECK :  without  apparent  JVD/ palpable Nodes/TM    LUNGS: no acc muscle use,  Min  barrel  contour chest wall with bilateral  slightly decreased bs s audible wheeze and  without cough on insp or exp maneuvers and min  Hyperresonant  to  percussion bilaterally    CV:  RRR  no s3 or murmur or increase in P2, and no edema   ABD:  soft and nontender with pos end  insp Hoover's  in the supine position.  No bruits or organomegaly appreciated   MS:  Nl gait/ ext warm without deformities Or obvious joint restrictions  calf tenderness, cyanosis or clubbing     SKIN: warm and dry without lesions    NEURO:  alert, approp, nl sensorium with  no motor or cerebellar deficits apparent.            CXR PA and Lateral:   11/19/2022 :    I personally reviewed images and impression is as follows: Improved vs priors/ no pna        Assessment

## 2022-11-21 ENCOUNTER — Other Ambulatory Visit: Payer: Self-pay

## 2022-11-21 DIAGNOSIS — M810 Age-related osteoporosis without current pathological fracture: Secondary | ICD-10-CM

## 2022-11-21 MED ORDER — DENOSUMAB 60 MG/ML ~~LOC~~ SOSY
60.0000 mg | PREFILLED_SYRINGE | Freq: Once | SUBCUTANEOUS | Status: AC
Start: 2022-12-05 — End: 2023-02-24
  Administered 2023-02-24: 60 mg via SUBCUTANEOUS

## 2022-11-25 ENCOUNTER — Inpatient Hospital Stay: Payer: Medicare HMO | Attending: Family Medicine

## 2022-11-25 ENCOUNTER — Inpatient Hospital Stay: Payer: Medicare HMO

## 2022-11-25 DIAGNOSIS — N184 Chronic kidney disease, stage 4 (severe): Secondary | ICD-10-CM

## 2022-11-25 DIAGNOSIS — N183 Chronic kidney disease, stage 3 unspecified: Secondary | ICD-10-CM | POA: Insufficient documentation

## 2022-11-25 DIAGNOSIS — Z7951 Long term (current) use of inhaled steroids: Secondary | ICD-10-CM | POA: Diagnosis not present

## 2022-11-25 DIAGNOSIS — D631 Anemia in chronic kidney disease: Secondary | ICD-10-CM | POA: Insufficient documentation

## 2022-11-25 DIAGNOSIS — R609 Edema, unspecified: Secondary | ICD-10-CM | POA: Insufficient documentation

## 2022-11-25 DIAGNOSIS — M7989 Other specified soft tissue disorders: Secondary | ICD-10-CM | POA: Insufficient documentation

## 2022-11-25 LAB — CBC WITH DIFFERENTIAL (CANCER CENTER ONLY)
Abs Immature Granulocytes: 0.01 10*3/uL (ref 0.00–0.07)
Basophils Absolute: 0 10*3/uL (ref 0.0–0.1)
Basophils Relative: 1 %
Eosinophils Absolute: 0.3 10*3/uL (ref 0.0–0.5)
Eosinophils Relative: 6 %
HCT: 37.6 % (ref 36.0–46.0)
Hemoglobin: 11.3 g/dL — ABNORMAL LOW (ref 12.0–15.0)
Immature Granulocytes: 0 %
Lymphocytes Relative: 19 %
Lymphs Abs: 1.1 10*3/uL (ref 0.7–4.0)
MCH: 30.8 pg (ref 26.0–34.0)
MCHC: 30.1 g/dL (ref 30.0–36.0)
MCV: 102.5 fL — ABNORMAL HIGH (ref 80.0–100.0)
Monocytes Absolute: 0.7 10*3/uL (ref 0.1–1.0)
Monocytes Relative: 11 %
Neutro Abs: 3.6 10*3/uL (ref 1.7–7.7)
Neutrophils Relative %: 63 %
Platelet Count: 142 10*3/uL — ABNORMAL LOW (ref 150–400)
RBC: 3.67 MIL/uL — ABNORMAL LOW (ref 3.87–5.11)
RDW: 14.8 % (ref 11.5–15.5)
WBC Count: 5.8 10*3/uL (ref 4.0–10.5)
nRBC: 0 % (ref 0.0–0.2)

## 2022-11-25 NOTE — Progress Notes (Signed)
Pt's Hemoglobin noted at 11.3. No injection needed at this time per Dr's orders. Copy of labs given to patient. Pt instructed to follow scheduled appointments and call our center if issues occur. Pt verbalized understanding of  instructions.

## 2022-12-08 ENCOUNTER — Other Ambulatory Visit: Payer: Self-pay | Admitting: Family Medicine

## 2022-12-10 ENCOUNTER — Inpatient Hospital Stay: Payer: Medicare HMO

## 2022-12-10 ENCOUNTER — Other Ambulatory Visit: Payer: Self-pay

## 2022-12-10 ENCOUNTER — Encounter: Payer: Self-pay | Admitting: Hematology & Oncology

## 2022-12-10 ENCOUNTER — Inpatient Hospital Stay: Payer: Medicare HMO | Admitting: Hematology & Oncology

## 2022-12-10 VITALS — BP 121/50 | HR 74 | Temp 99.0°F | Resp 19 | Ht 62.0 in | Wt 174.8 lb

## 2022-12-10 DIAGNOSIS — E1122 Type 2 diabetes mellitus with diabetic chronic kidney disease: Secondary | ICD-10-CM

## 2022-12-10 DIAGNOSIS — D510 Vitamin B12 deficiency anemia due to intrinsic factor deficiency: Secondary | ICD-10-CM

## 2022-12-10 DIAGNOSIS — N184 Chronic kidney disease, stage 4 (severe): Secondary | ICD-10-CM | POA: Diagnosis not present

## 2022-12-10 DIAGNOSIS — Z7951 Long term (current) use of inhaled steroids: Secondary | ICD-10-CM | POA: Diagnosis not present

## 2022-12-10 DIAGNOSIS — D631 Anemia in chronic kidney disease: Secondary | ICD-10-CM

## 2022-12-10 DIAGNOSIS — R609 Edema, unspecified: Secondary | ICD-10-CM | POA: Diagnosis not present

## 2022-12-10 DIAGNOSIS — D509 Iron deficiency anemia, unspecified: Secondary | ICD-10-CM

## 2022-12-10 DIAGNOSIS — N182 Chronic kidney disease, stage 2 (mild): Secondary | ICD-10-CM | POA: Diagnosis not present

## 2022-12-10 DIAGNOSIS — N183 Chronic kidney disease, stage 3 unspecified: Secondary | ICD-10-CM | POA: Diagnosis not present

## 2022-12-10 LAB — CMP (CANCER CENTER ONLY)
ALT: 8 U/L (ref 0–44)
AST: 12 U/L — ABNORMAL LOW (ref 15–41)
Albumin: 4.2 g/dL (ref 3.5–5.0)
Alkaline Phosphatase: 39 U/L (ref 38–126)
Anion gap: 7 (ref 5–15)
BUN: 58 mg/dL — ABNORMAL HIGH (ref 8–23)
CO2: 34 mmol/L — ABNORMAL HIGH (ref 22–32)
Calcium: 10.1 mg/dL (ref 8.9–10.3)
Chloride: 100 mmol/L (ref 98–111)
Creatinine: 1.74 mg/dL — ABNORMAL HIGH (ref 0.44–1.00)
GFR, Estimated: 29 mL/min — ABNORMAL LOW (ref >60)
Glucose, Bld: 100 mg/dL — ABNORMAL HIGH (ref 70–99)
Potassium: 5.3 mmol/L — ABNORMAL HIGH (ref 3.5–5.1)
Sodium: 141 mmol/L (ref 135–145)
Total Bilirubin: 0.5 mg/dL (ref <1.2)
Total Protein: 6.9 g/dL (ref 6.5–8.1)

## 2022-12-10 LAB — RETICULOCYTES
Immature Retic Fract: 11.9 % (ref 2.3–15.9)
RBC.: 3.41 MIL/uL — ABNORMAL LOW (ref 3.87–5.11)
Retic Count, Absolute: 33.4 10*3/uL (ref 19.0–186.0)
Retic Ct Pct: 1 % (ref 0.4–3.1)

## 2022-12-10 LAB — CBC WITH DIFFERENTIAL (CANCER CENTER ONLY)
Abs Immature Granulocytes: 0.01 10*3/uL (ref 0.00–0.07)
Basophils Absolute: 0 10*3/uL (ref 0.0–0.1)
Basophils Relative: 0 %
Eosinophils Absolute: 0.3 10*3/uL (ref 0.0–0.5)
Eosinophils Relative: 5 %
HCT: 34.9 % — ABNORMAL LOW (ref 36.0–46.0)
Hemoglobin: 10.8 g/dL — ABNORMAL LOW (ref 12.0–15.0)
Immature Granulocytes: 0 %
Lymphocytes Relative: 16 %
Lymphs Abs: 0.8 10*3/uL (ref 0.7–4.0)
MCH: 30.8 pg (ref 26.0–34.0)
MCHC: 30.9 g/dL (ref 30.0–36.0)
MCV: 99.4 fL (ref 80.0–100.0)
Monocytes Absolute: 0.6 10*3/uL (ref 0.1–1.0)
Monocytes Relative: 11 %
Neutro Abs: 3.5 10*3/uL (ref 1.7–7.7)
Neutrophils Relative %: 68 %
Platelet Count: 124 10*3/uL — ABNORMAL LOW (ref 150–400)
RBC: 3.51 MIL/uL — ABNORMAL LOW (ref 3.87–5.11)
RDW: 14.6 % (ref 11.5–15.5)
WBC Count: 5.2 10*3/uL (ref 4.0–10.5)
nRBC: 0 % (ref 0.0–0.2)

## 2022-12-10 LAB — FERRITIN: Ferritin: 366 ng/mL — ABNORMAL HIGH (ref 11–307)

## 2022-12-10 MED ORDER — EPOETIN ALFA-EPBX 40000 UNIT/ML IJ SOLN
40000.0000 [IU] | Freq: Once | INTRAMUSCULAR | Status: AC
  Administered 2022-12-10: 40000 [IU] via SUBCUTANEOUS
  Filled 2022-12-10: qty 1

## 2022-12-10 NOTE — Progress Notes (Signed)
Hematology and Oncology Follow Up Visit  [name] [id number] 1940-09-13 82 y.o. 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 is here today for follow-up.  Her red blood cell count is doing better now that we have her on the Retacrit more often.  Her hemoglobin is staying between 10-11..  She still has the oxygen.  She still has a cardiopulmonary issues.  She is eating okay.  She is looking forward to the Holiday season.  She has had no obvious bleeding.  There has been no change in bowel or bladder habits.  She has had no rashes.  Patient continues to have leg swelling which I suspect be chronic.  She has had no problems with mouth sores.  She is on quite a few medications.  Her blood sugars seem to be under decent control.  When we last saw her, her iron studies showed a ferritin of 397 with an iron saturation of 25%.  Currently, I would say that her performance status is probably ECOG 2.    Medications:  Allergies as of 12/10/2022       Reactions   Montelukast Sodium Palpitations, Other (See Comments)   REACTION: HEART PALPITATIONS, CHEST PAIN.   Sulfa Antibiotics Other (See Comments)   CHEST PAIN   Sulfonamide Derivatives Other (See Comments)   CHEST PAIN   Ace Inhibitors Other (See Comments)   PT. STATED UNKNOWN REACTION   Indomethacin Other (See Comments)   PT. STATED UNKNOWN REACTION        Medication List        Accurate as of December 10, 2022  3:50 PM. If you have any questions, ask your nurse or doctor.          Accu-Chek Aviva Soln USE AS DIRECTED WITH GLUCOMETER   Accu-Chek Guide test strip Generic drug: glucose blood TEST BLOOD SUGAR EVERY DAY AND AS NEEDED   Accu-Chek Guide w/Device Kit USE AS DIRECTED TO CHECK BLOOD SUGAR   Accu-Chek Softclix Lancets lancets TEST  BLOOD SUGAR EVERY DAY AND AS NEEDED   albuterol 108 (90 Base) MCG/ACT inhaler Commonly known as: VENTOLIN HFA INHALE 2 PUFFS EVERY 4 (FOUR) HOURS AS NEEDED FOR WHEEZING OR SHORTNESS OF BREATH.   allopurinol 100 MG tablet Commonly known as: ZYLOPRIM TAKE 2 TABLETS ONE TIME DAILY   b complex vitamins tablet Take 1 tablet by mouth daily.   cyanocobalamin 1000 MCG tablet Commonly known as: VITAMIN B12 Take 1,000 mcg by mouth daily.   docusate sodium 100 MG capsule Commonly known as: COLACE Take 1 capsule (100 mg total) by mouth 2 (two) times daily.   DropSafe Alcohol Prep 70 % Pads USE AS DIRECTED  BEFORE  CHECKING BLOOD SUGAR   fluticasone 50 MCG/ACT nasal spray Commonly known as: FLONASE Place 2 sprays into both nostrils daily.   furosemide 40 MG tablet Commonly known as: LASIX TAKE 1 TABLET DAILY , TAKE SECOND TABLET DAILY AS NEEDED FOR INCREASED EDEMA OR WEIGHT GAIN OVER 3 LBS/24 HRS   ipratropium-albuterol 0.5-2.5 (3) MG/3ML Soln Commonly known as: DUONEB Take 3 mLs by nebulization every 4 (four) hours as needed.   loratadine 10 MG tablet Commonly known as: CLARITIN Take 1 tablet (10 mg total) by mouth at bedtime. What changed:  when to take this reasons  to take this   metoprolol succinate 25 MG 24 hr tablet Commonly known as: TOPROL-XL Take 12.5 mg by mouth daily.   OXYGEN Inhale 2 L into the lungs continuous.   pantoprazole 40 MG tablet Commonly known as: PROTONIX TAKE 1 TABLET EVERY DAY   polyethylene glycol 17 g packet Commonly known as: MIRALAX / GLYCOLAX Take 17 g by mouth daily.   pravastatin 40 MG tablet Commonly known as: PRAVACHOL TAKE 1 TABLET EVERY DAY   PROBIOTIC PO Take 1 tablet by mouth daily.   Prolia 60 MG/ML Sosy injection Generic drug: denosumab To be given in office.  Pt has appt 08/15/22.  Dx code: M81.0   Symbicort 160-4.5 MCG/ACT inhaler Generic drug: budesonide-formoterol INHALE 2 PUFFS TWICE DAILY IN THE MORNING AND AT  BEDTIME   traMADol 50 MG tablet Commonly known as: ULTRAM TAKE 1 TABLET BY MOUTH EVERY 6 HOURS AS NEEDED   trolamine salicylate 10 % cream Commonly known as: ASPERCREME Apply 1 application topically as needed for muscle pain.        Allergies:  Allergies  Allergen Reactions   Montelukast Sodium Palpitations and Other (See Comments)    REACTION: HEART PALPITATIONS, CHEST PAIN.   Sulfa Antibiotics Other (See Comments)    CHEST PAIN   Sulfonamide Derivatives Other (See Comments)    CHEST PAIN   Ace Inhibitors Other (See Comments)    PT. STATED UNKNOWN REACTION   Indomethacin Other (See Comments)    PT. STATED UNKNOWN REACTION    Past Medical History, Surgical history, Social history, and Family History were reviewed and updated.  Review of Systems: All other 10 point review of systems is negative.   Physical Exam:  height is 5\' 2"  (1.575 m) and weight is 174 lb 12.8 oz (79.3 kg). Her oral temperature is 99 F (37.2 C). Her blood pressure is 121/50 (abnormal) and her pulse is 74. Her respiration is 19 and oxygen saturation is 96%.   Wt Readings from Last 3 Encounters:  12/10/22 174 lb 12.8 oz (79.3 kg)  11/19/22 178 lb 6.4 oz (80.9 kg)  10/30/22 170 lb (77.1 kg)    Ocular: Sclerae unicteric, pupils equal, round and reactive to light Ear-nose-throat: Oropharynx clear, dentition fair Lymphatic: No cervical or supraclavicular adenopathy Lungs no rales or rhonchi, good excursion bilaterally Heart regular rate and rhythm, no murmur appreciated Abd soft, nontender, positive bowel sounds MSK no focal spinal tenderness, no joint edema Neuro: non-focal, well-oriented, appropriate affect Breasts: Deferred   Lab Results  Component Value Date   WBC 5.2 12/10/2022   HGB 10.8 (L) 12/10/2022   HCT 34.9 (L) 12/10/2022   MCV 99.4 12/10/2022   PLT 124 (L) 12/10/2022   Lab Results  Component Value Date   FERRITIN 397 (H) 10/23/2022   IRON 63 10/23/2022   TIBC 256 10/23/2022    UIBC 193 10/23/2022   IRONPCTSAT 25 10/23/2022   Lab Results  Component Value Date   RETICCTPCT 1.0 12/10/2022   RBC 3.51 (L) 12/10/2022   RBC 3.41 (L) 12/10/2022   No results found for: "KPAFRELGTCHN", "LAMBDASER", "KAPLAMBRATIO" No results found for: "IGGSERUM", "IGA", "IGMSERUM" No results found for: "TOTALPROTELP", "ALBUMINELP", "A1GS", "A2GS", "BETS", "BETA2SER", "GAMS", "MSPIKE", "SPEI"   Chemistry      Component Value Date/Time   NA 141 12/10/2022 1514   NA 142 09/24/2022 1118   K 5.3 (H) 12/10/2022 1514   CL 100 12/10/2022 1514   CO2 34 (H) 12/10/2022 1514   BUN 58 (H) 12/10/2022  1514   BUN 52 (H) 09/24/2022 1118   CREATININE 1.74 (H) 12/10/2022 1514   CREATININE 1.36 (H) 08/23/2013 1134      Component Value Date/Time   CALCIUM 10.1 12/10/2022 1514   ALKPHOS 39 12/10/2022 1514   AST 12 (L) 12/10/2022 1514   ALT 8 12/10/2022 1514   BILITOT 0.5 12/10/2022 1514       Impression and Plan:   Ms. Juhas is a very pleasant 82 yo caucasian female with multifactorial anemia.   She will get her Retacrit today.  Will see what her iron studies look like.  Currently, I think the every 2-week Retacrit is working.  Her hemoglobin is holding steady.  We will see what her iron studies look like.  We will continue every 2 weeks with Retacrit and labs.  We will see her back ourselves in 2 months.  Josph Macho, MD 11/20/20243:50 PM

## 2022-12-10 NOTE — Patient Instructions (Signed)

## 2022-12-11 LAB — IRON AND IRON BINDING CAPACITY (CC-WL,HP ONLY)
Iron: 97 ug/dL (ref 28–170)
Saturation Ratios: 35 % — ABNORMAL HIGH (ref 10.4–31.8)
TIBC: 279 ug/dL (ref 250–450)
UIBC: 182 ug/dL (ref 148–442)

## 2022-12-12 ENCOUNTER — Other Ambulatory Visit: Payer: Self-pay | Admitting: Family Medicine

## 2022-12-12 DIAGNOSIS — M549 Dorsalgia, unspecified: Secondary | ICD-10-CM

## 2022-12-12 NOTE — Telephone Encounter (Signed)
Requesting: tramadol 50mg   Contract: 09/24/21 UDS: 09/24/21 Last Visit: 10/21/22 Next Visit: 03/10/23 Last Refill: 11/10/22 #90 and 0RF  Please Advise

## 2022-12-16 LAB — FACTOR 5 LEIDEN

## 2022-12-24 ENCOUNTER — Inpatient Hospital Stay: Payer: Medicare HMO

## 2022-12-24 ENCOUNTER — Inpatient Hospital Stay: Payer: Medicare HMO | Attending: Family Medicine

## 2022-12-24 DIAGNOSIS — N183 Chronic kidney disease, stage 3 unspecified: Secondary | ICD-10-CM | POA: Insufficient documentation

## 2022-12-24 DIAGNOSIS — N184 Chronic kidney disease, stage 4 (severe): Secondary | ICD-10-CM

## 2022-12-24 DIAGNOSIS — D631 Anemia in chronic kidney disease: Secondary | ICD-10-CM | POA: Diagnosis not present

## 2022-12-24 DIAGNOSIS — Z79899 Other long term (current) drug therapy: Secondary | ICD-10-CM | POA: Diagnosis not present

## 2022-12-24 LAB — CBC WITH DIFFERENTIAL (CANCER CENTER ONLY)
Abs Immature Granulocytes: 0.02 10*3/uL (ref 0.00–0.07)
Basophils Absolute: 0 10*3/uL (ref 0.0–0.1)
Basophils Relative: 1 %
Eosinophils Absolute: 0.2 10*3/uL (ref 0.0–0.5)
Eosinophils Relative: 4 %
HCT: 35.4 % — ABNORMAL LOW (ref 36.0–46.0)
Hemoglobin: 11 g/dL — ABNORMAL LOW (ref 12.0–15.0)
Immature Granulocytes: 0 %
Lymphocytes Relative: 15 %
Lymphs Abs: 0.9 10*3/uL (ref 0.7–4.0)
MCH: 31.1 pg (ref 26.0–34.0)
MCHC: 31.1 g/dL (ref 30.0–36.0)
MCV: 100 fL (ref 80.0–100.0)
Monocytes Absolute: 0.8 10*3/uL (ref 0.1–1.0)
Monocytes Relative: 13 %
Neutro Abs: 4 10*3/uL (ref 1.7–7.7)
Neutrophils Relative %: 67 %
Platelet Count: 109 10*3/uL — ABNORMAL LOW (ref 150–400)
RBC: 3.54 MIL/uL — ABNORMAL LOW (ref 3.87–5.11)
RDW: 15.3 % (ref 11.5–15.5)
WBC Count: 6 10*3/uL (ref 4.0–10.5)
nRBC: 0 % (ref 0.0–0.2)

## 2022-12-27 ENCOUNTER — Other Ambulatory Visit: Payer: Self-pay | Admitting: Family

## 2023-01-07 ENCOUNTER — Inpatient Hospital Stay: Payer: Medicare HMO

## 2023-01-07 DIAGNOSIS — N183 Chronic kidney disease, stage 3 unspecified: Secondary | ICD-10-CM | POA: Diagnosis not present

## 2023-01-07 DIAGNOSIS — N184 Chronic kidney disease, stage 4 (severe): Secondary | ICD-10-CM

## 2023-01-07 DIAGNOSIS — Z79899 Other long term (current) drug therapy: Secondary | ICD-10-CM | POA: Diagnosis not present

## 2023-01-07 DIAGNOSIS — D631 Anemia in chronic kidney disease: Secondary | ICD-10-CM | POA: Diagnosis not present

## 2023-01-07 LAB — CBC WITH DIFFERENTIAL (CANCER CENTER ONLY)
Abs Immature Granulocytes: 0.02 10*3/uL (ref 0.00–0.07)
Basophils Absolute: 0 10*3/uL (ref 0.0–0.1)
Basophils Relative: 0 %
Eosinophils Absolute: 0.3 10*3/uL (ref 0.0–0.5)
Eosinophils Relative: 4 %
HCT: 36.6 % (ref 36.0–46.0)
Hemoglobin: 11.4 g/dL — ABNORMAL LOW (ref 12.0–15.0)
Immature Granulocytes: 0 %
Lymphocytes Relative: 21 %
Lymphs Abs: 1.3 10*3/uL (ref 0.7–4.0)
MCH: 30.5 pg (ref 26.0–34.0)
MCHC: 31.1 g/dL (ref 30.0–36.0)
MCV: 97.9 fL (ref 80.0–100.0)
Monocytes Absolute: 0.7 10*3/uL (ref 0.1–1.0)
Monocytes Relative: 12 %
Neutro Abs: 4 10*3/uL (ref 1.7–7.7)
Neutrophils Relative %: 63 %
Platelet Count: 132 10*3/uL — ABNORMAL LOW (ref 150–400)
RBC: 3.74 MIL/uL — ABNORMAL LOW (ref 3.87–5.11)
RDW: 15 % (ref 11.5–15.5)
WBC Count: 6.4 10*3/uL (ref 4.0–10.5)
nRBC: 0 % (ref 0.0–0.2)

## 2023-01-15 NOTE — Telephone Encounter (Signed)
CAM was placed already- waiting on EOB.

## 2023-01-20 ENCOUNTER — Other Ambulatory Visit: Payer: Self-pay | Admitting: Family

## 2023-01-20 ENCOUNTER — Other Ambulatory Visit: Payer: Self-pay

## 2023-01-20 DIAGNOSIS — M549 Dorsalgia, unspecified: Secondary | ICD-10-CM

## 2023-01-20 NOTE — Telephone Encounter (Signed)
Requesting: tramadol 50mg   Contract:09/24/21 UDS:09/24/21 Last Visit: 10/21/22 w/ Ladona Ridgel Next Visit: None Last Refill: 12/12/22 #90 and 0RF   Please Advise

## 2023-01-20 NOTE — Telephone Encounter (Signed)
 Copied from CRM [id number]. Topic: Clinical - Medication Refill >> Jan 20, 2023 11:17 AM Rolin D wrote: Most Recent Primary Care Visit:  Provider: DEBBY MOCCASIN  Department: [facility]  Visit Type: MEDICARE AWV, SEQUENTIAL  Date: 10/30/2022  Medication: traMADol  (ULTRAM ) 50 MG tablet  Has the patient contacted their pharmacy? Yes (Agent: If no, request that the patient contact the pharmacy for the refill. If patient does not wish to contact the pharmacy document the reason why and proceed with request.) (Agent: If yes, when and what did the pharmacy advise?) Patient was advised to call pcp   Is this the correct pharmacy for this prescription? Yes If no, delete pharmacy and type the correct one.  This is the patient's preferred pharmacy:  [facility] [address] ([address]), [place] - [address] [place] ([place]) [place] [address] Phone: [phone] Fax: [phone]     Has the prescription been filled recently? No  Is the patient out of the medication? Yes  Has the patient been seen for an appointment in the last year OR does the patient have an upcoming appointment? Yes  Can we respond through MyChart? Yes  Agent: Please be advised that Rx refills may take up to 3 business days. We ask that you follow-up with your pharmacy.

## 2023-01-22 ENCOUNTER — Inpatient Hospital Stay: Payer: Medicare HMO

## 2023-01-22 ENCOUNTER — Inpatient Hospital Stay: Payer: Medicare HMO | Attending: Family Medicine

## 2023-01-22 VITALS — BP 110/46 | HR 92 | Temp 97.8°F | Resp 19

## 2023-01-22 DIAGNOSIS — D631 Anemia in chronic kidney disease: Secondary | ICD-10-CM

## 2023-01-22 DIAGNOSIS — N184 Chronic kidney disease, stage 4 (severe): Secondary | ICD-10-CM

## 2023-01-22 DIAGNOSIS — N183 Chronic kidney disease, stage 3 unspecified: Secondary | ICD-10-CM | POA: Diagnosis not present

## 2023-01-22 DIAGNOSIS — D509 Iron deficiency anemia, unspecified: Secondary | ICD-10-CM

## 2023-01-22 LAB — CBC WITH DIFFERENTIAL (CANCER CENTER ONLY)
Abs Immature Granulocytes: 0.02 10*3/uL (ref 0.00–0.07)
Basophils Absolute: 0.1 10*3/uL (ref 0.0–0.1)
Basophils Relative: 1 %
Eosinophils Absolute: 0.5 10*3/uL (ref 0.0–0.5)
Eosinophils Relative: 7 %
HCT: 34.7 % — ABNORMAL LOW (ref 36.0–46.0)
Hemoglobin: 10.8 g/dL — ABNORMAL LOW (ref 12.0–15.0)
Immature Granulocytes: 0 %
Lymphocytes Relative: 21 %
Lymphs Abs: 1.3 10*3/uL (ref 0.7–4.0)
MCH: 30.5 pg (ref 26.0–34.0)
MCHC: 31.1 g/dL (ref 30.0–36.0)
MCV: 98 fL (ref 80.0–100.0)
Monocytes Absolute: 0.8 10*3/uL (ref 0.1–1.0)
Monocytes Relative: 12 %
Neutro Abs: 3.8 10*3/uL (ref 1.7–7.7)
Neutrophils Relative %: 59 %
Platelet Count: 119 10*3/uL — ABNORMAL LOW (ref 150–400)
RBC: 3.54 MIL/uL — ABNORMAL LOW (ref 3.87–5.11)
RDW: 14.9 % (ref 11.5–15.5)
WBC Count: 6.4 10*3/uL (ref 4.0–10.5)
nRBC: 0 % (ref 0.0–0.2)

## 2023-01-22 MED ORDER — EPOETIN ALFA-EPBX 40000 UNIT/ML IJ SOLN
40000.0000 [IU] | Freq: Once | INTRAMUSCULAR | Status: AC
  Administered 2023-01-22: 40000 [IU] via SUBCUTANEOUS
  Filled 2023-01-22: qty 1

## 2023-01-23 ENCOUNTER — Telehealth: Payer: Self-pay

## 2023-01-23 ENCOUNTER — Other Ambulatory Visit: Payer: Self-pay

## 2023-01-23 ENCOUNTER — Other Ambulatory Visit (HOSPITAL_COMMUNITY): Payer: Self-pay

## 2023-01-23 NOTE — Telephone Encounter (Signed)
 Prolia VOB initiated via [email or web address]  Next Prolia inj DUE: 02/12/23

## 2023-01-26 ENCOUNTER — Other Ambulatory Visit: Payer: Self-pay

## 2023-01-28 ENCOUNTER — Other Ambulatory Visit (HOSPITAL_COMMUNITY): Payer: Self-pay

## 2023-01-28 NOTE — Telephone Encounter (Addendum)
 Pt ready for scheduling for PROLIA  on or after : 02/12/23  Out-of-pocket cost due at time of visit: $348  Number of injection/visits approved: 2  Primary: [employer] Prolia  co-insurance: 20% Admin fee co-insurance: 20%  Secondary: --- Prolia  co-insurance:  Admin fee co-insurance:   Medical Benefit Details: Date Benefits were checked: 01/23/23 Deductible: NO/ Coinsurance: 20%/ Admin Fee: 20%  Prior Auth: APPROVED PA# [id number] Expiration Date: 06/05/21-01/20/24   # of doses approved:2  Pharmacy benefit: Copay $893.03 If patient wants fill through the pharmacy benefit please send prescription to: [employer], and include estimated need by date in rx notes. Pharmacy will ship medication directly to the office.  Patient NOT eligible for Prolia  Copay Card. Copay Card can make patient's cost as little as $25. Link to apply: [email or web address]://[email or web address]  ** This summary of benefits is an estimation of the patient's out-of-pocket cost. Exact cost may very based on individual plan coverage.

## 2023-01-28 NOTE — Telephone Encounter (Signed)
 [name]

## 2023-01-28 NOTE — Telephone Encounter (Signed)
 Pharmacy Patient Advocate Encounter   Received notification from  [employer] Portal that prior authorization for PROLIA  is required/requested.   Insurance verification completed.   The patient is insured through [employer] .   Per test claim: PA required; PA submitted to above mentioned insurance via CoverMyMeds Key/confirmation #/EOC  [id number] Status is pending

## 2023-01-28 NOTE — Telephone Encounter (Signed)
 Pharmacy Patient Advocate Encounter  Received notification from [employer] that Prior Authorization for PROLIA  has been APPROVED from 06/05/21 to 01/20/24. Ran test claim, Copay is $893.03. This test claim was processed through [facility]- copay amounts may vary at other pharmacies due to pharmacy/plan contracts, or as the patient moves through the different stages of their insurance plan.   PA #/Case ID/Reference #: [id number]

## 2023-01-30 NOTE — Telephone Encounter (Signed)
 MyChart message sent to the pt.

## 2023-02-02 NOTE — Telephone Encounter (Addendum)
 Left message on machine for patient to back to schedule.  "Please warm transfer to office"

## 2023-02-04 ENCOUNTER — Inpatient Hospital Stay: Payer: Medicare HMO

## 2023-02-04 ENCOUNTER — Inpatient Hospital Stay (HOSPITAL_BASED_OUTPATIENT_CLINIC_OR_DEPARTMENT_OTHER): Payer: Medicare HMO | Admitting: Medical Oncology

## 2023-02-04 VITALS — BP 123/61 | HR 95 | Temp 98.0°F | Resp 17 | Wt 171.0 lb

## 2023-02-04 DIAGNOSIS — D510 Vitamin B12 deficiency anemia due to intrinsic factor deficiency: Secondary | ICD-10-CM

## 2023-02-04 DIAGNOSIS — D509 Iron deficiency anemia, unspecified: Secondary | ICD-10-CM

## 2023-02-04 DIAGNOSIS — D631 Anemia in chronic kidney disease: Secondary | ICD-10-CM

## 2023-02-04 DIAGNOSIS — N183 Chronic kidney disease, stage 3 unspecified: Secondary | ICD-10-CM | POA: Diagnosis not present

## 2023-02-04 DIAGNOSIS — E1122 Type 2 diabetes mellitus with diabetic chronic kidney disease: Secondary | ICD-10-CM

## 2023-02-04 DIAGNOSIS — N184 Chronic kidney disease, stage 4 (severe): Secondary | ICD-10-CM

## 2023-02-04 LAB — CBC WITH DIFFERENTIAL (CANCER CENTER ONLY)
Abs Immature Granulocytes: 0.08 10*3/uL — ABNORMAL HIGH (ref 0.00–0.07)
Basophils Absolute: 0 10*3/uL (ref 0.0–0.1)
Basophils Relative: 1 %
Eosinophils Absolute: 0.4 10*3/uL (ref 0.0–0.5)
Eosinophils Relative: 5 %
HCT: 33.3 % — ABNORMAL LOW (ref 36.0–46.0)
Hemoglobin: 10.3 g/dL — ABNORMAL LOW (ref 12.0–15.0)
Immature Granulocytes: 1 %
Lymphocytes Relative: 12 %
Lymphs Abs: 0.8 10*3/uL (ref 0.7–4.0)
MCH: 30.7 pg (ref 26.0–34.0)
MCHC: 30.9 g/dL (ref 30.0–36.0)
MCV: 99.1 fL (ref 80.0–100.0)
Monocytes Absolute: 0.7 10*3/uL (ref 0.1–1.0)
Monocytes Relative: 12 %
Neutro Abs: 4.5 10*3/uL (ref 1.7–7.7)
Neutrophils Relative %: 69 %
Platelet Count: 138 10*3/uL — ABNORMAL LOW (ref 150–400)
RBC: 3.36 MIL/uL — ABNORMAL LOW (ref 3.87–5.11)
RDW: 15.8 % — ABNORMAL HIGH (ref 11.5–15.5)
WBC Count: 6.5 10*3/uL (ref 4.0–10.5)
nRBC: 0 % (ref 0.0–0.2)

## 2023-02-04 LAB — CMP (CANCER CENTER ONLY)
ALT: 7 U/L (ref 0–44)
AST: 11 U/L — ABNORMAL LOW (ref 15–41)
Albumin: 4.1 g/dL (ref 3.5–5.0)
Alkaline Phosphatase: 39 U/L (ref 38–126)
Anion gap: 7 (ref 5–15)
BUN: 47 mg/dL — ABNORMAL HIGH (ref 8–23)
CO2: 36 mmol/L — ABNORMAL HIGH (ref 22–32)
Calcium: 10.2 mg/dL (ref 8.9–10.3)
Chloride: 98 mmol/L (ref 98–111)
Creatinine: 2 mg/dL — ABNORMAL HIGH (ref 0.44–1.00)
GFR, Estimated: 25 mL/min — ABNORMAL LOW (ref >60)
Glucose, Bld: 86 mg/dL (ref 70–99)
Potassium: 4.7 mmol/L (ref 3.5–5.1)
Sodium: 141 mmol/L (ref 135–145)
Total Bilirubin: 0.8 mg/dL (ref 0.0–1.2)
Total Protein: 6.6 g/dL (ref 6.5–8.1)

## 2023-02-04 LAB — FERRITIN: Ferritin: 342 ng/mL — ABNORMAL HIGH (ref 11–307)

## 2023-02-04 LAB — RETICULOCYTES
Immature Retic Fract: 6.9 % (ref 2.3–15.9)
RBC.: 3.36 MIL/uL — ABNORMAL LOW (ref 3.87–5.11)
Retic Count, Absolute: 34.3 10*3/uL (ref 19.0–186.0)
Retic Ct Pct: 1 % (ref 0.4–3.1)

## 2023-02-04 MED ORDER — EPOETIN ALFA-EPBX 40000 UNIT/ML IJ SOLN
40000.0000 [IU] | Freq: Once | INTRAMUSCULAR | Status: AC
  Administered 2023-02-04: 40000 [IU] via SUBCUTANEOUS
  Filled 2023-02-04: qty 1

## 2023-02-04 NOTE — Progress Notes (Signed)
 Hematology and Oncology Follow Up Visit  [name] [id number] 1940/09/20 83 y.o. 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  Her red blood cell count is doing better now that we have her on the Retacrit  more often.  Her hemoglobin is staying between 10-11.  She still has the oxygen .  She still has a cardiopulmonary issues.  Today she states that she is doing well. She has no concerns.   She has had no obvious bleeding.  There has been no change in bowel or bladder habits.  She has had no rashes.  Patient continues to have leg swelling which I suspect be chronic.  When we last saw her, her iron  studies showed a ferritin of 366 with an iron  saturation of 35%.  Currently, I would say that her performance status is probably ECOG 2.    Medications:  Allergies as of 02/04/2023       Reactions   Montelukast  Sodium Palpitations, Other (See Comments)   REACTION: HEART PALPITATIONS, CHEST PAIN.   Sulfa Antibiotics Other (See Comments)   CHEST PAIN   Sulfonamide Derivatives Other (See Comments)   CHEST PAIN   Ace Inhibitors Other (See Comments)   PT. STATED UNKNOWN REACTION   Indomethacin Other (See Comments)   PT. STATED UNKNOWN REACTION        Medication List        Accurate as of February 04, 2023  3:44 PM. If you have any questions, ask your nurse or doctor.          Accu-Chek Aviva Soln USE AS DIRECTED WITH GLUCOMETER   Accu-Chek Guide test strip Generic drug: glucose blood TEST BLOOD SUGAR EVERY DAY AND AS NEEDED   Accu-Chek Guide w/Device Kit USE AS DIRECTED TO CHECK BLOOD SUGAR   Accu-Chek Softclix Lancets lancets TEST BLOOD SUGAR EVERY DAY AND AS NEEDED   albuterol  108 (90 Base) MCG/ACT inhaler Commonly known as: VENTOLIN  HFA INHALE 2 PUFFS EVERY 4 (FOUR)  HOURS AS NEEDED FOR WHEEZING OR SHORTNESS OF BREATH.   allopurinol  100 MG tablet Commonly known as: ZYLOPRIM  TAKE 2 TABLETS ONE TIME DAILY   b complex vitamins tablet Take 1 tablet by mouth daily.   cyanocobalamin  1000 MCG tablet Commonly known as: VITAMIN B12 Take 1,000 mcg by mouth daily.   docusate sodium  100 MG capsule Commonly known as: COLACE Take 1 capsule (100 mg total) by mouth 2 (two) times daily.   DropSafe Alcohol  Prep 70 % Pads USE AS DIRECTED  BEFORE  CHECKING BLOOD SUGAR   fluticasone  50 MCG/ACT nasal spray Commonly known as: FLONASE  Place 2 sprays into both nostrils daily.   furosemide  40 MG tablet Commonly known as: LASIX  TAKE 1 TABLET DAILY , TAKE SECOND TABLET DAILY AS NEEDED FOR INCREASED EDEMA OR WEIGHT GAIN OVER 3 LBS/24 HRS   ipratropium-albuterol  0.5-2.5 (3) MG/3ML Soln Commonly known as: DUONEB Take 3 mLs by nebulization every 4 (four) hours as needed.   loratadine  10 MG tablet Commonly known as: CLARITIN  Take 1 tablet (10 mg total) by mouth at bedtime. What changed:  when to take this reasons to take this   metoprolol  succinate 25 MG 24 hr tablet Commonly known as: TOPROL -XL Take 12.5 mg by mouth daily.   OXYGEN  Inhale 2  L into the lungs continuous.   pantoprazole  40 MG tablet Commonly known as: PROTONIX  TAKE 1 TABLET EVERY DAY   polyethylene glycol 17 g packet Commonly known as: MIRALAX  / GLYCOLAX  Take 17 g by mouth daily.   pravastatin  40 MG tablet Commonly known as: PRAVACHOL  TAKE 1 TABLET EVERY DAY   PROBIOTIC PO Take 1 tablet by mouth daily.   Prolia  60 MG/ML Sosy injection Generic drug: denosumab  To be given in office.  Pt has appt 08/15/22.  Dx code: M81.0   Symbicort  160-4.5 MCG/ACT inhaler Generic drug: budesonide -formoterol  INHALE 2 PUFFS TWICE DAILY IN THE MORNING AND AT BEDTIME   traMADol  50 MG tablet Commonly known as: ULTRAM  TAKE 1 TABLET BY MOUTH EVERY 6 HOURS AS NEEDED   trolamine salicylate 10 %  cream Commonly known as: ASPERCREME Apply 1 application topically as needed for muscle pain.        Allergies:  Allergies  Allergen Reactions   Montelukast  Sodium Palpitations and Other (See Comments)    REACTION: HEART PALPITATIONS, CHEST PAIN.   Sulfa Antibiotics Other (See Comments)    CHEST PAIN   Sulfonamide Derivatives Other (See Comments)    CHEST PAIN   Ace Inhibitors Other (See Comments)    PT. STATED UNKNOWN REACTION   Indomethacin Other (See Comments)    PT. STATED UNKNOWN REACTION    Past Medical History, Surgical history, Social history, and Family History were reviewed and updated.  Review of Systems: All other 10 point review of systems is negative.   Physical Exam:  weight is 171 lb (77.6 kg). Her oral temperature is 98 F (36.7 C). Her blood pressure is 123/61 and her pulse is 95. Her respiration is 17 and oxygen  saturation is 100%.   Wt Readings from Last 3 Encounters:  02/04/23 171 lb (77.6 kg)  12/10/22 174 lb 12.8 oz (79.3 kg)  11/19/22 178 lb 6.4 oz (80.9 kg)    Ocular: Sclerae unicteric, pupils equal, round and reactive to light Ear-nose-throat: Oropharynx clear, dentition fair Lymphatic: No cervical or supraclavicular adenopathy Lungs no rales or rhonchi, good excursion bilaterally Heart regular rate and rhythm, no murmur appreciated Abd soft, nontender, positive bowel sounds MSK no focal spinal tenderness, no joint edema Neuro: non-focal, well-oriented, appropriate affect   Lab Results  Component Value Date   WBC 6.5 02/04/2023   HGB 10.3 (L) 02/04/2023   HCT 33.3 (L) 02/04/2023   MCV 99.1 02/04/2023   PLT 138 (L) 02/04/2023   Lab Results  Component Value Date   FERRITIN 366 (H) 12/10/2022   IRON  97 12/10/2022   TIBC 279 12/10/2022   UIBC 182 12/10/2022   IRONPCTSAT 35 (H) 12/10/2022   Lab Results  Component Value Date   RETICCTPCT 1.0 02/04/2023   RBC 3.36 (L) 02/04/2023   RBC 3.36 (L) 02/04/2023   No results found for:  "KPAFRELGTCHN", "LAMBDASER", "KAPLAMBRATIO" No results found for: "IGGSERUM", "IGA", "IGMSERUM" No results found for: "TOTALPROTELP", "ALBUMINELP", "A1GS", "A2GS", "BETS", "BETA2SER", "GAMS", "MSPIKE", "SPEI"   Chemistry      Component Value Date/Time   NA 141 02/04/2023 1418   NA 142 09/24/2022 1118   K 4.7 02/04/2023 1418   CL 98 02/04/2023 1418   CO2 36 (H) 02/04/2023 1418   BUN 47 (H) 02/04/2023 1418   BUN 52 (H) 09/24/2022 1118   CREATININE 2.00 (H) 02/04/2023 1418   CREATININE 1.36 (H) 08/23/2013 1134      Component Value Date/Time   CALCIUM 10.2 02/04/2023 1418   ALKPHOS  39 02/04/2023 1418   AST 11 (L) 02/04/2023 1418   ALT 7 02/04/2023 1418   BILITOT 0.8 02/04/2023 1418     Encounter Diagnoses  Name Primary?   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   We will administer retacrit  today for her Hgb of 10.3 Platelet count is stable overall with a value of 138  RTC every 2 weeks with Retacrit  and labs x 2 months RTC 2 months Carter, labs (CBC, CMP, iron , ferritin, B12), Retacrit    TZIVY ASPINALL, PA-C 1/15/20253:44 PM

## 2023-02-04 NOTE — Patient Instructions (Signed)

## 2023-02-04 NOTE — Telephone Encounter (Signed)
 Left message on machine for patient to back to schedule.  "Please warm transfer to office"

## 2023-02-05 LAB — IRON AND IRON BINDING CAPACITY (CC-WL,HP ONLY)
Iron: 106 ug/dL (ref 28–170)
Saturation Ratios: 41 % — ABNORMAL HIGH (ref 10.4–31.8)
TIBC: 262 ug/dL (ref 250–450)
UIBC: 156 ug/dL (ref 148–442)

## 2023-02-05 NOTE — Telephone Encounter (Signed)
Left message on machine for patient to back to schedule.   "Please warm transfer to office"     Also letter mailed to patient.

## 2023-02-06 ENCOUNTER — Telehealth: Payer: Self-pay | Admitting: Family Medicine

## 2023-02-06 NOTE — Telephone Encounter (Signed)
Copied from CRM [id number]. Topic: General - Other >> Feb 06, 2023  4:05 PM Clayton Bibles wrote: Reason for CRM: Nasreen would like a call back to schedule a prolia injection. Please call [phone]

## 2023-02-08 ENCOUNTER — Other Ambulatory Visit: Payer: Self-pay | Admitting: Family Medicine

## 2023-02-09 DIAGNOSIS — M25511 Pain in right shoulder: Secondary | ICD-10-CM | POA: Diagnosis not present

## 2023-02-09 DIAGNOSIS — W19XXXA Unspecified fall, initial encounter: Secondary | ICD-10-CM | POA: Diagnosis not present

## 2023-02-09 DIAGNOSIS — M25531 Pain in right wrist: Secondary | ICD-10-CM | POA: Diagnosis not present

## 2023-02-09 DIAGNOSIS — M533 Sacrococcygeal disorders, not elsewhere classified: Secondary | ICD-10-CM | POA: Diagnosis not present

## 2023-02-11 NOTE — Telephone Encounter (Signed)
Left message to schedule for Prolia.  It will be $348 OOP.

## 2023-02-12 NOTE — Telephone Encounter (Signed)
Pt aware of cost. Prolia scheduled.

## 2023-02-13 DIAGNOSIS — E1122 Type 2 diabetes mellitus with diabetic chronic kidney disease: Secondary | ICD-10-CM | POA: Diagnosis not present

## 2023-02-13 DIAGNOSIS — N184 Chronic kidney disease, stage 4 (severe): Secondary | ICD-10-CM | POA: Diagnosis not present

## 2023-02-13 DIAGNOSIS — D631 Anemia in chronic kidney disease: Secondary | ICD-10-CM | POA: Diagnosis not present

## 2023-02-13 DIAGNOSIS — I129 Hypertensive chronic kidney disease with stage 1 through stage 4 chronic kidney disease, or unspecified chronic kidney disease: Secondary | ICD-10-CM | POA: Diagnosis not present

## 2023-02-14 ENCOUNTER — Other Ambulatory Visit: Payer: Self-pay | Admitting: Cardiology

## 2023-02-14 ENCOUNTER — Other Ambulatory Visit: Payer: Self-pay | Admitting: Family Medicine

## 2023-02-14 DIAGNOSIS — H6992 Unspecified Eustachian tube disorder, left ear: Secondary | ICD-10-CM

## 2023-02-16 NOTE — Progress Notes (Deleted)
[name] is a 83 y.o. female presents to the office today for Prolia, per physician's orders. Prolia 60 mg SQ , was administered *** arm today. Patient tolerated injection. Patient next injection due: 6 months, appt made: No- will schedule in 5 months after benifits are ran again Initial injection: NO Patient supplied: NO  DOD:   Creft, Feliberto Harts

## 2023-02-17 ENCOUNTER — Telehealth: Payer: Self-pay | Admitting: Family Medicine

## 2023-02-17 ENCOUNTER — Ambulatory Visit: Payer: Medicare HMO

## 2023-02-17 DIAGNOSIS — M810 Age-related osteoporosis without current pathological fracture: Secondary | ICD-10-CM

## 2023-02-17 NOTE — Telephone Encounter (Signed)
Pt scheduled for 02/24/23

## 2023-02-17 NOTE — Telephone Encounter (Signed)
Copied from CRM [id number]. Topic: Appointments - Scheduling Inquiry for Clinic >> Feb 17, 2023  4:39 PM Alvino Blood C wrote: Reason for CRM: Patient is looking to get scheduled for her Prolia injection and would like a call back at [phone] (M)

## 2023-02-18 ENCOUNTER — Inpatient Hospital Stay: Payer: Medicare HMO

## 2023-02-18 VITALS — BP 132/65 | HR 81 | Temp 97.6°F | Resp 20

## 2023-02-18 DIAGNOSIS — E1122 Type 2 diabetes mellitus with diabetic chronic kidney disease: Secondary | ICD-10-CM

## 2023-02-18 DIAGNOSIS — N184 Chronic kidney disease, stage 4 (severe): Secondary | ICD-10-CM

## 2023-02-18 DIAGNOSIS — D509 Iron deficiency anemia, unspecified: Secondary | ICD-10-CM

## 2023-02-18 DIAGNOSIS — D631 Anemia in chronic kidney disease: Secondary | ICD-10-CM | POA: Diagnosis not present

## 2023-02-18 DIAGNOSIS — N183 Chronic kidney disease, stage 3 unspecified: Secondary | ICD-10-CM | POA: Diagnosis not present

## 2023-02-18 LAB — CBC WITH DIFFERENTIAL (CANCER CENTER ONLY)
Abs Immature Granulocytes: 0.02 10*3/uL (ref 0.00–0.07)
Basophils Absolute: 0 10*3/uL (ref 0.0–0.1)
Basophils Relative: 1 %
Eosinophils Absolute: 0.3 10*3/uL (ref 0.0–0.5)
Eosinophils Relative: 5 %
HCT: 35.2 % — ABNORMAL LOW (ref 36.0–46.0)
Hemoglobin: 10.8 g/dL — ABNORMAL LOW (ref 12.0–15.0)
Immature Granulocytes: 0 %
Lymphocytes Relative: 20 %
Lymphs Abs: 1.2 10*3/uL (ref 0.7–4.0)
MCH: 30.9 pg (ref 26.0–34.0)
MCHC: 30.7 g/dL (ref 30.0–36.0)
MCV: 100.6 fL — ABNORMAL HIGH (ref 80.0–100.0)
Monocytes Absolute: 0.7 10*3/uL (ref 0.1–1.0)
Monocytes Relative: 12 %
Neutro Abs: 3.8 10*3/uL (ref 1.7–7.7)
Neutrophils Relative %: 62 %
Platelet Count: 158 10*3/uL (ref 150–400)
RBC: 3.5 MIL/uL — ABNORMAL LOW (ref 3.87–5.11)
RDW: 16 % — ABNORMAL HIGH (ref 11.5–15.5)
WBC Count: 6 10*3/uL (ref 4.0–10.5)
nRBC: 0 % (ref 0.0–0.2)

## 2023-02-18 MED ORDER — EPOETIN ALFA-EPBX 40000 UNIT/ML IJ SOLN
40000.0000 [IU] | Freq: Once | INTRAMUSCULAR | Status: AC
Start: 2023-02-18 — End: 2023-02-18
  Administered 2023-02-18: 40000 [IU] via SUBCUTANEOUS
  Filled 2023-02-18: qty 1

## 2023-02-18 NOTE — Patient Instructions (Signed)

## 2023-02-22 ENCOUNTER — Other Ambulatory Visit: Payer: Self-pay | Admitting: Family

## 2023-02-22 DIAGNOSIS — M549 Dorsalgia, unspecified: Secondary | ICD-10-CM

## 2023-02-24 ENCOUNTER — Telehealth: Payer: Self-pay | Admitting: *Deleted

## 2023-02-24 ENCOUNTER — Ambulatory Visit (INDEPENDENT_AMBULATORY_CARE_PROVIDER_SITE_OTHER): Payer: Medicare HMO | Admitting: *Deleted

## 2023-02-24 DIAGNOSIS — M810 Age-related osteoporosis without current pathological fracture: Secondary | ICD-10-CM | POA: Diagnosis not present

## 2023-02-24 NOTE — Telephone Encounter (Signed)
 Prolia injection given today.

## 2023-02-24 NOTE — Progress Notes (Signed)
Prolia injection given today in left arm.

## 2023-02-25 ENCOUNTER — Other Ambulatory Visit: Payer: Self-pay

## 2023-02-25 DIAGNOSIS — M810 Age-related osteoporosis without current pathological fracture: Secondary | ICD-10-CM

## 2023-02-25 MED ORDER — DENOSUMAB 60 MG/ML ~~LOC~~ SOSY: 60.0000 mg | PREFILLED_SYRINGE | Freq: Once | SUBCUTANEOUS | Status: DC

## 2023-02-25 NOTE — Telephone Encounter (Signed)
Prolia was done 02/24/23 Next is due 08/24/23 CAM placed.

## 2023-02-27 ENCOUNTER — Telehealth: Payer: Self-pay

## 2023-02-27 ENCOUNTER — Other Ambulatory Visit: Payer: Self-pay | Admitting: Family Medicine

## 2023-02-27 NOTE — Telephone Encounter (Signed)
 Copied from CRM [id number]. Topic: Clinical - Medication Question >> Feb 27, 2023 10:41 AM Franky GRADE wrote: Reason for CRM: Patient is calling to follow up on her request for traMADol  (ULTRAM ) 50 MG tablet, advised that the medication was pended and can take 3 days, patient states she is completely out and needs for it to be sent today.

## 2023-02-27 NOTE — Telephone Encounter (Unsigned)
 Copied from CRM [id number]. Topic: Clinical - Medication Refill >> Feb 27, 2023 10:38 AM Franky GRADE wrote: Most Recent Primary Care Visit:  Provider: DEBBY MOCCASIN  Department: [facility]  Visit Type: MEDICARE AWV, SEQUENTIAL  Date: 10/30/2022  Medication: traMADol  (ULTRAM ) 50 MG tablet  Has the patient contacted their pharmacy? Yes, refill request sent to provider but have not heard back. (Agent: If no, request that the patient contact the pharmacy for the refill. If patient does not wish to contact the pharmacy document the reason why and proceed with request.) (Agent: If yes, when and what did the pharmacy advise?)  Is this the correct pharmacy for this prescription? Yes If no, delete pharmacy and type the correct one.  This is the patient's preferred pharmacy:  [facility] [address] ([address] [place] - [address] [place] ([place]) [place] [address] Phone: [phone] Fax: [phone]     Has the prescription been filled recently? No  Is the patient out of the medication? Yes  Has the patient been seen for an appointment in the last year OR does the patient have an upcoming appointment? Yes  Can we respond through MyChart? Yes  Agent: Please be advised that Rx refills may take up to 3 business days. We ask that you follow-up with your pharmacy.

## 2023-03-02 ENCOUNTER — Ambulatory Visit: Payer: Self-pay | Admitting: Family Medicine

## 2023-03-02 ENCOUNTER — Telehealth: Payer: Self-pay | Admitting: Family Medicine

## 2023-03-02 NOTE — Telephone Encounter (Signed)
 Patient has an appointment with Dr. Neomi Banks 03/03/2023

## 2023-03-02 NOTE — Telephone Encounter (Signed)
 Copied from CRM [id number]. Topic: Clinical - Medication Refill >> Mar 02, 2023  3:43 PM Ovid Blow wrote: Most Recent Primary Care Visit:  Provider: Gabe Jock  Department: [facility]  Visit Type: CLINICAL SUPPORT  Date: 02/24/2023  Medication: traMADol  (ULTRAM ) 50 MG tablet  Has the patient contacted their pharmacy? Yes (Agent: If no, request that the patient contact the pharmacy for the refill. If patient does not wish to contact the pharmacy document the reason why and proceed with request.) (Agent: If yes, when and what did the pharmacy advise?)  Is this the correct pharmacy for this prescription? Yes If no, delete pharmacy and type the correct one.  This is the patient's preferred pharmacy:  [facility] [address] ([address]), [place] - [address] [place] ([place]) [place] [address] Phone: [phone] Fax: [phone]  [facility] - [place], [place] - [address] [place] [address] Phone: [phone] Fax: [phone]  Melode[facility] Spruce [facility] - [facility] [address] [place] [address] Phone: [phone] Fax: [phone]   Has the prescription been filled recently? No  Is the patient out of the medication? Yes  Has the patient been seen for an appointment in the last year OR does the patient have an upcoming appointment? Yes  Can we respond through MyChart? Yes  Agent: Please be advised that Rx refills may take up to 3 business days. We ask that you follow-up with your pharmacy.

## 2023-03-02 NOTE — Telephone Encounter (Signed)
   Chief Complaint: cough Symptoms: cough w/ clear sputum, SOB w/ exertion, body aches, sore throat, diarrhea, chest & nasal congestion Frequency: Friday Pertinent Negatives: Patient denies fever Disposition: [] ED /[] Urgent Care (no appt availability in office) / [x] Appointment(In office/virtual)/ []  [facility]/ [] Home Care/ [] Refused Recommended Disposition /[] [facility]/ []  Follow-up with PCP Additional Notes: pt has a history of COPD wears continuous O2 at home: had to increase level due to having some SOB w/exertion - stats currently in 90's: pt states last week it dropped into 80's.  BP earlier 160/100 now 139/80: not sure if OTC medications effective BP or if BP medication may need to be changed.  Pt would also like to follow up on refill request for pain medications she requested 02/24/2023: pt currently out of all pain medication.  Reason for Disposition  SEVERE coughing spells (e.g., whooping sound after coughing, vomiting after coughing)    Pt c/o coughing, SOB w/exertion- had to increase O2 to remain in 90's, nasal  & chest congestion, BP readings going high at times, tired, body aches  Answer Assessment - Initial Assessment Questions 1. ONSET: "When did the cough begin?"      Friday  2. SEVERITY: "How bad is the cough today?"      mild 3. SPUTUM: "Describe the color of your sputum" (none, dry cough; clear, white, yellow, green)     clear 4. HEMOPTYSIS: "Are you coughing up any blood?" If so ask: "How much?" (flecks, streaks, tablespoons, etc.)     no 5. DIFFICULTY BREATHING: "Are you having difficulty breathing?" If Yes, ask: "How bad is it?" (e.g., mild, moderate, severe)    - MILD: No SOB at rest, mild SOB with walking, speaks normally in sentences, can lie down, no retractions, pulse < 100.    - MODERATE: SOB at rest, SOB with minimal exertion and prefers to sit, cannot lie down flat, speaks in phrases, mild retractions, audible wheezing, pulse 100-120.     - SEVERE: Very SOB at rest, speaks in single words, struggling to breathe, sitting hunched forward, retractions, pulse > 120      severe 6. FEVER: "Do you have a fever?" If Yes, ask: "What is your temperature, how was it measured, and when did it start?"     fever 7. CARDIAC HISTORY: "Do you have any history of heart disease?" (e.g., heart attack, congestive heart failure)      CHF 8. LUNG HISTORY: "Do you have any history of lung disease?"  (e.g., pulmonary embolus, asthma, emphysema)     Asthma, COPD 9. PE RISK FACTORS: "Do you have a history of blood clots?" (or: recent major surgery, recent prolonged travel, bedridden)     N/a 10. OTHER SYMPTOMS: "Do you have any other symptoms?" (e.g., runny nose, wheezing, chest pain)       Body aches, headaches, dizziness, SOB w/exertion O2 in 90's, chest & nasal congestion, sore throat, diarrhea 11. PREGNANCY: "Is there any chance you are pregnant?" "When was your last menstrual period?"       N/a 12. TRAVEL: "Have you traveled out of the country in the last month?" (e.g., travel history, exposures)       N/a  Protocols used: Cough - Acute Non-Productive-A-AH

## 2023-03-03 ENCOUNTER — Ambulatory Visit: Payer: Medicare HMO | Admitting: Internal Medicine

## 2023-03-04 ENCOUNTER — Other Ambulatory Visit: Payer: Self-pay | Admitting: Family Medicine

## 2023-03-04 ENCOUNTER — Inpatient Hospital Stay: Payer: Medicare HMO

## 2023-03-04 ENCOUNTER — Inpatient Hospital Stay: Payer: Medicare HMO | Attending: Family Medicine

## 2023-03-04 DIAGNOSIS — D631 Anemia in chronic kidney disease: Secondary | ICD-10-CM | POA: Insufficient documentation

## 2023-03-04 DIAGNOSIS — N184 Chronic kidney disease, stage 4 (severe): Secondary | ICD-10-CM | POA: Insufficient documentation

## 2023-03-04 DIAGNOSIS — M549 Dorsalgia, unspecified: Secondary | ICD-10-CM

## 2023-03-04 MED ORDER — TRAMADOL HCL 50 MG PO TABS: 50.0000 mg | ORAL_TABLET | Freq: Four times a day (QID) | ORAL | 0 refills | Status: DC | PRN

## 2023-03-04 NOTE — Telephone Encounter (Signed)
Requesting: Tramadol (Ultram) 50 mg Tablet Contract: 09/24/2021 UDS: 09/24/2021 Last Visit: 10/21/2022 Hyman Hopes) Next Visit: 11/03/2023 Last Refill: 01/21/2023  Please Advise

## 2023-03-06 ENCOUNTER — Other Ambulatory Visit: Payer: Self-pay

## 2023-03-10 ENCOUNTER — Inpatient Hospital Stay: Payer: Medicare HMO

## 2023-03-10 ENCOUNTER — Encounter: Payer: Medicare HMO | Admitting: Family Medicine

## 2023-03-11 ENCOUNTER — Encounter: Payer: Medicare HMO | Admitting: Family Medicine

## 2023-03-18 ENCOUNTER — Inpatient Hospital Stay: Payer: Medicare HMO

## 2023-03-18 VITALS — BP 121/53 | HR 82 | Temp 98.6°F | Resp 18

## 2023-03-18 DIAGNOSIS — D631 Anemia in chronic kidney disease: Secondary | ICD-10-CM | POA: Diagnosis not present

## 2023-03-18 DIAGNOSIS — N184 Chronic kidney disease, stage 4 (severe): Secondary | ICD-10-CM

## 2023-03-18 DIAGNOSIS — D509 Iron deficiency anemia, unspecified: Secondary | ICD-10-CM

## 2023-03-18 LAB — CBC WITH DIFFERENTIAL (CANCER CENTER ONLY)
Abs Immature Granulocytes: 0.02 10*3/uL (ref 0.00–0.07)
Basophils Absolute: 0 10*3/uL (ref 0.0–0.1)
Basophils Relative: 1 %
Eosinophils Absolute: 0.3 10*3/uL (ref 0.0–0.5)
Eosinophils Relative: 5 %
HCT: 35.2 % — ABNORMAL LOW (ref 36.0–46.0)
Hemoglobin: 10.9 g/dL — ABNORMAL LOW (ref 12.0–15.0)
Immature Granulocytes: 0 %
Lymphocytes Relative: 21 %
Lymphs Abs: 1.3 10*3/uL (ref 0.7–4.0)
MCH: 30.7 pg (ref 26.0–34.0)
MCHC: 31 g/dL (ref 30.0–36.0)
MCV: 99.2 fL (ref 80.0–100.0)
Monocytes Absolute: 0.7 10*3/uL (ref 0.1–1.0)
Monocytes Relative: 12 %
Neutro Abs: 3.7 10*3/uL (ref 1.7–7.7)
Neutrophils Relative %: 61 %
Platelet Count: 127 10*3/uL — ABNORMAL LOW (ref 150–400)
RBC: 3.55 MIL/uL — ABNORMAL LOW (ref 3.87–5.11)
RDW: 15.7 % — ABNORMAL HIGH (ref 11.5–15.5)
WBC Count: 6 10*3/uL (ref 4.0–10.5)
nRBC: 0 % (ref 0.0–0.2)

## 2023-03-18 MED ORDER — EPOETIN ALFA-EPBX 40000 UNIT/ML IJ SOLN
40000.0000 [IU] | Freq: Once | INTRAMUSCULAR | Status: AC
  Administered 2023-03-18: 40000 [IU] via SUBCUTANEOUS
  Filled 2023-03-18: qty 1

## 2023-03-18 NOTE — Patient Instructions (Signed)

## 2023-03-23 ENCOUNTER — Encounter: Payer: Self-pay | Admitting: Family Medicine

## 2023-03-23 ENCOUNTER — Ambulatory Visit (INDEPENDENT_AMBULATORY_CARE_PROVIDER_SITE_OTHER): Payer: Medicare HMO | Admitting: Family Medicine

## 2023-03-23 VITALS — BP 125/48 | HR 77 | Ht 62.0 in | Wt 169.0 lb

## 2023-03-23 DIAGNOSIS — M1990 Unspecified osteoarthritis, unspecified site: Secondary | ICD-10-CM

## 2023-03-23 DIAGNOSIS — M109 Gout, unspecified: Secondary | ICD-10-CM | POA: Diagnosis not present

## 2023-03-23 DIAGNOSIS — I5042 Chronic combined systolic (congestive) and diastolic (congestive) heart failure: Secondary | ICD-10-CM | POA: Diagnosis not present

## 2023-03-23 DIAGNOSIS — J309 Allergic rhinitis, unspecified: Secondary | ICD-10-CM | POA: Diagnosis not present

## 2023-03-23 DIAGNOSIS — N184 Chronic kidney disease, stage 4 (severe): Secondary | ICD-10-CM

## 2023-03-23 DIAGNOSIS — M8589 Other specified disorders of bone density and structure, multiple sites: Secondary | ICD-10-CM | POA: Diagnosis not present

## 2023-03-23 DIAGNOSIS — J9612 Chronic respiratory failure with hypercapnia: Secondary | ICD-10-CM

## 2023-03-23 DIAGNOSIS — J4540 Moderate persistent asthma, uncomplicated: Secondary | ICD-10-CM

## 2023-03-23 DIAGNOSIS — K219 Gastro-esophageal reflux disease without esophagitis: Secondary | ICD-10-CM | POA: Diagnosis not present

## 2023-03-23 DIAGNOSIS — J9611 Chronic respiratory failure with hypoxia: Secondary | ICD-10-CM

## 2023-03-23 DIAGNOSIS — I10 Essential (primary) hypertension: Secondary | ICD-10-CM | POA: Diagnosis not present

## 2023-03-23 DIAGNOSIS — E782 Mixed hyperlipidemia: Secondary | ICD-10-CM | POA: Diagnosis not present

## 2023-03-23 DIAGNOSIS — E118 Type 2 diabetes mellitus with unspecified complications: Secondary | ICD-10-CM | POA: Diagnosis not present

## 2023-03-23 LAB — HEMOGLOBIN A1C: Hgb A1c MFr Bld: 5.7 % (ref 4.6–6.5)

## 2023-03-23 NOTE — Assessment & Plan Note (Signed)
 Stable with Flonase and Claritin.

## 2023-03-23 NOTE — Assessment & Plan Note (Signed)
 Following with [facility] No acute concerns. Recent labs stable.

## 2023-03-23 NOTE — Assessment & Plan Note (Signed)
 Medication management: pravastatin 40 mg daily Lifestyle factors for lowering cholesterol include: Diet therapy - heart-healthy diet rich in fruits, veggies, fiber-rich whole grains, lean meats, chicken, fish (at least twice a week), fat-free or 1% dairy products; foods low in saturated/trans fats, cholesterol, sodium, and sugar. Mediterranean diet has shown to be very heart healthy. Regular exercise - recommend at least 30 minutes a day, 5 times per week Weight management

## 2023-03-23 NOTE — Assessment & Plan Note (Signed)
 Following with pulmonology Continue inhalers

## 2023-03-23 NOTE — Assessment & Plan Note (Signed)
 Last A1c stable with lifestyle measures only.

## 2023-03-23 NOTE — Progress Notes (Signed)
 Established Patient Office Visit  Subjective   Patient ID: [name], female    DOB: 29-Jun-1940  Age: 83 y.o. MRN: [id number]  Chief Complaint  Patient presents with   Establish Care    HPI  Patient presents for chronic disease management and transfer of care from my colleague, Dr. Abner Greenspan.    Discussed the use of AI scribe software for clinical note transcription with the patient, who gave verbal consent to proceed.  History of Present Illness [name] "Epping" is an 83 year old female with chronic respiratory issues, heart failure, and kidney disease who presents for a regular follow-up visit.   She is currently on 2 liters of oxygen, which she can reduce at night when resting. She uses an albuterol inhaler occasionally, but not regularly. Her medication regimen includes allopurinol for gout, Prolia, a stool softener as needed, Flonase for allergies, Lasix 40 mg daily with an extra tablet if she gains weight or experiences swelling, Claritin, DuoNeb inhaler or nebulizer as needed, metoprolol 12.5 mg once a day, Protonix for GERD, pravastatin 40 mg for cholesterol, a probiotic, Symbicort for breathing twice daily, tramadol once or twice a day for arthritis pain, and B12 daily. She reports regular bowel movements and her blood sugar is usually around 100 in the mornings.  She has a history of chronic respiratory issues, heart failure, and kidney disease. She is under the care of specialists including a kidney specialist at [facility], Dr. Myna Hidalgo for anemia, Dr. Sherene Sires for pulmonology, and Dr. Mayford Knife for cardiology. She visits these specialists once or twice a year.  She experienced a spell of dizziness last week, described as a spinning sensation that resolved after sitting down. She attributes it to possibly being dehydrated or related to allergies. No further symptoms.      Diabetes: - Checking glucose at home: yes, fasting usually around 100 - Medications: none -  Compliance: n/a - Diet: low carb, heart healthy - Exercise: minimal - Eye exam: UTD - Foot exam: today - Microalbumin: today - Denies symptoms of hypoglycemia, polyuria, polydipsia, numbness extremities, foot ulcers/trauma, wounds that are not healing, medication side effects  Lab Results  Component Value Date   HGBA1C 5.4 01/03/2022     Hypertension, CHF, (NSTEMI 2008 with normal coronary arteries on cath, complicated by right groin pseudoaneurysm): - Medications: lasix 40 mg daily (additional PRN, not needing often), metoprolol succinate 12.5 mg daily,  - Compliance: good - Checking BP at home:  - Denies any SOB, recurrent headaches, CP, vision changes, LE edema, dizziness, palpitations, or medication side effects. - Following with Dr. Mayford Knife, cardiology  - Last seen by cardiology August 2024, no changes, routine follow-up annually  - Echo 2023: EF 50-55% with grade 1 diastolic dysfunction, normal RV, mild MR   Hyperlipidemia (goal LDL < 70): - medications: pravastatin 40 mg daily  - compliance: good - medication SEs: none The ASCVD Risk score (Arnett DK, et al., 2019) failed to calculate for the following reasons:   The 2019 ASCVD risk score is only valid for ages 83 to 30   CKD: - Followed by [facility]   Gout: - Well controlled with allopurinol 100 mg daily    GERD: - management: pantoprazole 40 mg daily  - symptoms: well controlled   Osteoarthritis, chronic back pain: - She has been taking tramadol TID PRN for many years and this works well for her - safety discussed.    Chronic respiratory failure, asthma: - Following with  Dr. Sherene Sires, pulmonology - Management: Symbicort BID, PRN albuterol and duonebs - Chronic O2 2L nasal canula   Osteopenia: - Prolia      Wt Readings from Last 3 Encounters:  03/23/23 169 lb (76.7 kg)  02/04/23 171 lb (77.6 kg)  12/10/22 174 lb 12.8 oz (79.3 kg)         ROS All review of systems negative except what is  listed in the HPI    Objective:     BP (!) 125/48   Pulse 77   Ht 5\' 2"  (1.575 m)   Wt 169 lb (76.7 kg)   SpO2 95%   BMI 30.91 kg/m  2L O2 nasal canula (chronic)   Physical Exam Vitals reviewed.  Constitutional:      Appearance: Normal appearance.  Cardiovascular:     Rate and Rhythm: Normal rate and regular rhythm.  Pulmonary:     Effort: Pulmonary effort is normal.     Breath sounds: Normal breath sounds. No wheezing, rhonchi or rales.  Skin:    General: Skin is warm and dry.     Comments: Chronic BLE discoloration  Neurological:     Mental Status: She is alert and oriented to person, place, and time.  Psychiatric:        Mood and Affect: Mood normal.        Behavior: Behavior normal.        Thought Content: Thought content normal.        Judgment: Judgment normal.      No results found for any visits on 03/23/23.    The ASCVD Risk score (Arnett DK, et al., 2019) failed to calculate for the following reasons:   The 2019 ASCVD risk score is only valid for ages 74 to 42    Assessment & Plan:   Problem List Items Addressed This Visit       Active Problems   Hyperlipidemia, mixed (Chronic)   Medication management: pravastatin 40 mg daily Lifestyle factors for lowering cholesterol include: Diet therapy - heart-healthy diet rich in fruits, veggies, fiber-rich whole grains, lean meats, chicken, fish (at least twice a week), fat-free or 1% dairy products; foods low in saturated/trans fats, cholesterol, sodium, and sugar. Mediterranean diet has shown to be very heart healthy. Regular exercise - recommend at least 30 minutes a day, 5 times per week Weight management         Gout (Chronic)   Stable on allopurinol. No acute concerns today.       Essential hypertension (Chronic)   Blood pressure is at goal for age and co-morbidities.   Recommendations: continue current meds - lasix 40 mg daily, metoprolol succinate 12.5 mg daily - BP goal <130/80 - monitor  and log blood pressures at home - check around the same time each day in a relaxed setting - Limit salt to <2000 mg/day - Follow DASH eating plan (heart healthy diet) - limit alcohol to 2 standard drinks per day for men and 1 per day for women - avoid tobacco products - get at least 2 hours of regular aerobic exercise weekly Patient aware of signs/symptoms requiring further/urgent evaluation.       Allergic rhinitis (Chronic)   Stable with Flonase and Claritin       Asthma, moderate persistent (Chronic)   Following with pulmonology Continue inhalers       GERD (Chronic)   Stable with PPI. Lifestyle measures reinforced.       Osteoarthritis (Chronic)   Previous PCP had  her on tramadol TID PRN. Patient states this works well, usually only needs twice daily dosing. Safety discussed.       Osteopenia (Chronic)   On Prolia. Lifestyle measures encouraged.       Diabetes type 2, controlled (HCC) - Primary (Chronic)   Last A1c stable with lifestyle measures only.       Relevant Orders   Hemoglobin A1c   Microalbumin / creatinine urine ratio   Chronic respiratory failure with hypoxia and hypercapnia (HCC) (Chronic)   Following with Dr. Sherene Sires. Continue Symbicort and PRN inhalers/nebs. On chronic O2.       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 No signs of fluid overload today. Continue daily lasix as prescribed.       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HCC) (Chronic)   Following with [facility] No acute concerns. Recent labs stable.        Return in about 4 months (around 07/23/2023) for routine follow-up.    Clayborne Dana, NP

## 2023-03-23 NOTE — Assessment & Plan Note (Signed)
 Stable with PPI. Lifestyle measures reinforced.

## 2023-03-23 NOTE — Assessment & Plan Note (Signed)
 Following with Dr. Sherene Sires. Continue Symbicort and PRN inhalers/nebs. On chronic O2.

## 2023-03-23 NOTE — Assessment & Plan Note (Signed)
 On Prolia. Lifestyle measures encouraged.

## 2023-03-23 NOTE — Assessment & Plan Note (Signed)
 Blood pressure is at goal for age and co-morbidities.   Recommendations: continue current meds - lasix 40 mg daily, metoprolol succinate 12.5 mg daily - BP goal <130/80 - monitor and log blood pressures at home - check around the same time each day in a relaxed setting - Limit salt to <2000 mg/day - Follow DASH eating plan (heart healthy diet) - limit alcohol to 2 standard drinks per day for men and 1 per day for women - avoid tobacco products - get at least 2 hours of regular aerobic exercise weekly Patient aware of signs/symptoms requiring further/urgent evaluation.

## 2023-03-23 NOTE — Assessment & Plan Note (Signed)
 Stable on allopurinol. No acute concerns today.

## 2023-03-23 NOTE — Assessment & Plan Note (Signed)
 Following with cardiology. No signs of fluid overload today. Continue daily lasix as prescribed.

## 2023-03-23 NOTE — Assessment & Plan Note (Signed)
 Previous PCP had her on tramadol TID PRN. Patient states this works well, usually only needs twice daily dosing. Safety discussed.

## 2023-03-24 LAB — MICROALBUMIN / CREATININE URINE RATIO
Creatinine,U: 124.1 mg/dL
Microalb Creat Ratio: 7.8 mg/g (ref 0.0–30.0)
Microalb, Ur: 1 mg/dL (ref 0.0–1.9)

## 2023-04-01 ENCOUNTER — Inpatient Hospital Stay: Payer: Medicare HMO

## 2023-04-01 ENCOUNTER — Other Ambulatory Visit: Payer: Self-pay | Admitting: Family

## 2023-04-01 ENCOUNTER — Inpatient Hospital Stay: Payer: Medicare HMO | Attending: Family Medicine | Admitting: Family

## 2023-04-01 VITALS — BP 103/51 | HR 82 | Temp 97.9°F | Resp 18 | Wt 168.8 lb

## 2023-04-01 DIAGNOSIS — N184 Chronic kidney disease, stage 4 (severe): Secondary | ICD-10-CM

## 2023-04-01 DIAGNOSIS — N183 Chronic kidney disease, stage 3 unspecified: Secondary | ICD-10-CM | POA: Insufficient documentation

## 2023-04-01 DIAGNOSIS — D631 Anemia in chronic kidney disease: Secondary | ICD-10-CM

## 2023-04-01 DIAGNOSIS — D509 Iron deficiency anemia, unspecified: Secondary | ICD-10-CM | POA: Diagnosis not present

## 2023-04-01 DIAGNOSIS — Z79899 Other long term (current) drug therapy: Secondary | ICD-10-CM | POA: Diagnosis not present

## 2023-04-01 LAB — CBC WITH DIFFERENTIAL (CANCER CENTER ONLY)
Abs Immature Granulocytes: 0.02 10*3/uL (ref 0.00–0.07)
Basophils Absolute: 0 10*3/uL (ref 0.0–0.1)
Basophils Relative: 1 %
Eosinophils Absolute: 0.3 10*3/uL (ref 0.0–0.5)
Eosinophils Relative: 5 %
HCT: 36 % (ref 36.0–46.0)
Hemoglobin: 11 g/dL — ABNORMAL LOW (ref 12.0–15.0)
Immature Granulocytes: 0 %
Lymphocytes Relative: 15 %
Lymphs Abs: 0.9 10*3/uL (ref 0.7–4.0)
MCH: 30.7 pg (ref 26.0–34.0)
MCHC: 30.6 g/dL (ref 30.0–36.0)
MCV: 100.6 fL — ABNORMAL HIGH (ref 80.0–100.0)
Monocytes Absolute: 0.7 10*3/uL (ref 0.1–1.0)
Monocytes Relative: 12 %
Neutro Abs: 3.9 10*3/uL (ref 1.7–7.7)
Neutrophils Relative %: 67 %
Platelet Count: 130 10*3/uL — ABNORMAL LOW (ref 150–400)
RBC: 3.58 MIL/uL — ABNORMAL LOW (ref 3.87–5.11)
RDW: 16.1 % — ABNORMAL HIGH (ref 11.5–15.5)
WBC Count: 5.8 10*3/uL (ref 4.0–10.5)
nRBC: 0 % (ref 0.0–0.2)

## 2023-04-01 LAB — RETICULOCYTES
Immature Retic Fract: 4.6 % (ref 2.3–15.9)
RBC.: 3.55 MIL/uL — ABNORMAL LOW (ref 3.87–5.11)
Retic Count, Absolute: 32 10*3/uL (ref 19.0–186.0)
Retic Ct Pct: 0.9 % (ref 0.4–3.1)

## 2023-04-01 LAB — CMP (CANCER CENTER ONLY)
ALT: 6 U/L (ref 0–44)
AST: 10 U/L — ABNORMAL LOW (ref 15–41)
Albumin: 4.3 g/dL (ref 3.5–5.0)
Alkaline Phosphatase: 46 U/L (ref 38–126)
Anion gap: 8 (ref 5–15)
BUN: 51 mg/dL — ABNORMAL HIGH (ref 8–23)
CO2: 34 mmol/L — ABNORMAL HIGH (ref 22–32)
Calcium: 10.2 mg/dL (ref 8.9–10.3)
Chloride: 99 mmol/L (ref 98–111)
Creatinine: 1.74 mg/dL — ABNORMAL HIGH (ref 0.44–1.00)
GFR, Estimated: 29 mL/min — ABNORMAL LOW (ref >60)
Glucose, Bld: 102 mg/dL — ABNORMAL HIGH (ref 70–99)
Potassium: 4.6 mmol/L (ref 3.5–5.1)
Sodium: 141 mmol/L (ref 135–145)
Total Bilirubin: 0.7 mg/dL (ref 0.0–1.2)
Total Protein: 6.8 g/dL (ref 6.5–8.1)

## 2023-04-01 LAB — FERRITIN: Ferritin: 336 ng/mL — ABNORMAL HIGH (ref 11–307)

## 2023-04-01 LAB — IRON AND IRON BINDING CAPACITY (CC-WL,HP ONLY)
Iron: 76 ug/dL (ref 28–170)
Saturation Ratios: 29 % (ref 10.4–31.8)
TIBC: 266 ug/dL (ref 250–450)
UIBC: 190 ug/dL (ref 148–442)

## 2023-04-01 NOTE — Progress Notes (Signed)
 Hematology and Oncology Follow Up Visit  [name] [id number] [birth year] 83 y.o. 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 is here today for follow-up. Hgb is up to 11.0 today! No ESA needed this visit.  She notes that her SOB is stable on 2l [place] supplemental O2.  She avoids caffeine to prevent palpitations.  No fever, chills, n/v, cough, rash, dizziness, chest pain, abdominal pain or changes in bowel or bladder habits.  Fluid retention in her lower extremities is improved.  No falls or syncope reported. She ambulates with a Rolator for added support.  Appetite and hydration are good. Weight is stable at 168 lbs.   ECOG Performance Status: 1 - Symptomatic but completely ambulatory  Medications:  Allergies as of 04/01/2023       Reactions   Montelukast Sodium Palpitations, Other (See Comments)   REACTION: HEART PALPITATIONS, CHEST PAIN.   Sulfa Antibiotics Other (See Comments)   CHEST PAIN   Sulfonamide Derivatives Other (See Comments)   CHEST PAIN   Ace Inhibitors Other (See Comments)   PT. STATED UNKNOWN REACTION   Indomethacin Other (See Comments)   PT. STATED UNKNOWN REACTION        Medication List        Accurate as of April 01, 2023  1:49 PM. If you have any questions, ask your nurse or doctor.          Accu-Chek Aviva Soln USE AS DIRECTED WITH GLUCOMETER   Accu-Chek Guide test strip Generic drug: glucose blood TEST BLOOD SUGAR EVERY DAY AND AS NEEDED   Accu-Chek Guide w/Device Kit USE AS DIRECTED TO CHECK BLOOD SUGAR   Accu-Chek Softclix Lancets lancets TEST BLOOD SUGAR EVERY DAY AND AS NEEDED   albuterol 108 (90 Base) MCG/ACT inhaler Commonly known as: VENTOLIN HFA INHALE 2 PUFFS EVERY 4 (FOUR) HOURS AS NEEDED FOR WHEEZING OR SHORTNESS OF BREATH.   allopurinol 100 MG  tablet Commonly known as: ZYLOPRIM TAKE 2 TABLETS ONE TIME DAILY   b complex vitamins tablet Take 1 tablet by mouth daily.   cyanocobalamin 1000 MCG tablet Commonly known as: VITAMIN B12 Take 1,000 mcg by mouth daily.   docusate sodium 100 MG capsule Commonly known as: COLACE Take 1 capsule (100 mg total) by mouth 2 (two) times daily.   DropSafe Alcohol Prep 70 % Pads USE AS DIRECTED  BEFORE  CHECKING BLOOD SUGAR   fluticasone 50 MCG/ACT nasal spray Commonly known as: FLONASE USE 2 SPRAYS IN BOTH NOSTRILS DAILY   furosemide 40 MG tablet Commonly known as: LASIX TAKE 1 TABLET DAILY , TAKE SECOND TABLET DAILY AS NEEDED FOR INCREASED EDEMA OR WEIGHT GAIN OVER 3 LBS/24 HRS   ipratropium-albuterol 0.5-2.5 (3) MG/3ML Soln Commonly known as: DUONEB Take 3 mLs by nebulization every 4 (four) hours as needed.   loratadine 10 MG tablet Commonly known as: CLARITIN Take 1 tablet (10 mg total) by mouth at bedtime. What changed:  when to take this reasons to take this   metoprolol succinate 25 MG 24 hr tablet Commonly known as: TOPROL-XL Take 12.5 mg by mouth daily.   OXYGEN Inhale 2 L into the lungs continuous.   pantoprazole 40 MG tablet Commonly known as: PROTONIX TAKE 1 TABLET EVERY DAY   polyethylene glycol 17 g packet Commonly  known as: MIRALAX / GLYCOLAX Take 17 g by mouth daily.   pravastatin 40 MG tablet Commonly known as: PRAVACHOL TAKE 1 TABLET EVERY DAY   PROBIOTIC PO Take 1 tablet by mouth daily.   Prolia 60 MG/ML Sosy injection Generic drug: denosumab To be given in office.  Pt has appt 08/15/22.  Dx code: M81.0   Symbicort 160-4.5 MCG/ACT inhaler Generic drug: budesonide-formoterol INHALE 2 PUFFS TWICE DAILY IN THE MORNING AND AT BEDTIME   traMADol 50 MG tablet Commonly known as: ULTRAM Take 1 tablet (50 mg total) by mouth every 6 (six) hours as needed.   trolamine salicylate 10 % cream Commonly known as: ASPERCREME Apply 1 application topically  as needed for muscle pain.        Allergies:  Allergies  Allergen Reactions   Montelukast Sodium Palpitations and Other (See Comments)    REACTION: HEART PALPITATIONS, CHEST PAIN.   Sulfa Antibiotics Other (See Comments)    CHEST PAIN   Sulfonamide Derivatives Other (See Comments)    CHEST PAIN   Ace Inhibitors Other (See Comments)    PT. STATED UNKNOWN REACTION   Indomethacin Other (See Comments)    PT. STATED UNKNOWN REACTION    Past Medical History, Surgical history, Social history, and Family History were reviewed and updated.  Review of Systems: All other 10 point review of systems is negative.   Physical Exam:  weight is 168 lb 12.8 oz (76.6 kg). Her oral temperature is 97.9 F (36.6 C). Her blood pressure is 103/51 (abnormal) and her pulse is 82. Her respiration is 18 and oxygen saturation is 100%.   Wt Readings from Last 3 Encounters:  04/01/23 168 lb 12.8 oz (76.6 kg)  03/23/23 169 lb (76.7 kg)  02/04/23 171 lb (77.6 kg)    Ocular: Sclerae unicteric, pupils equal, round and reactive to light Ear-nose-throat: Oropharynx clear, dentition fair Lymphatic: No cervical or supraclavicular adenopathy Lungs no rales or rhonchi, good excursion bilaterally Heart regular rate and rhythm, no murmur appreciated Abd soft, nontender, positive bowel sounds MSK no focal spinal tenderness, no joint edema Neuro: non-focal, well-oriented, appropriate affect Breasts: Deferred   Lab Results  Component Value Date   WBC 5.8 04/01/2023   HGB 11.0 (L) 04/01/2023   HCT 36.0 04/01/2023   MCV 100.6 (H) 04/01/2023   PLT 130 (L) 04/01/2023   Lab Results  Component Value Date   FERRITIN 342 (H) 02/04/2023   IRON 106 02/04/2023   TIBC 262 02/04/2023   UIBC 156 02/04/2023   IRONPCTSAT 41 (H) 02/04/2023   Lab Results  Component Value Date   RETICCTPCT 0.9 04/01/2023   RBC 3.55 (L) 04/01/2023   No results found for: "KPAFRELGTCHN", "LAMBDASER", "KAPLAMBRATIO" No results found  for: "IGGSERUM", "IGA", "IGMSERUM" No results found for: "TOTALPROTELP", "ALBUMINELP", "A1GS", "A2GS", "BETS", "BETA2SER", "GAMS", "MSPIKE", "SPEI"   Chemistry      Component Value Date/Time   NA 141 02/04/2023 1418   NA 142 09/24/2022 1118   K 4.7 02/04/2023 1418   CL 98 02/04/2023 1418   CO2 36 (H) 02/04/2023 1418   BUN 47 (H) 02/04/2023 1418   BUN 52 (H) 09/24/2022 1118   CREATININE 2.00 (H) 02/04/2023 1418   CREATININE 1.36 (H) 08/23/2013 1134      Component Value Date/Time   CALCIUM 10.2 02/04/2023 1418   ALKPHOS 39 02/04/2023 1418   AST 11 (L) 02/04/2023 1418   ALT 7 02/04/2023 1418   BILITOT 0.8 02/04/2023 1418  Impression and Plan: Ms. Scritchfield is a very pleasant 83 yo caucasian female with multifactorial anemia.  No ESA needed this visit. Hgb 11.0.  Iron studies pending.  Lab and injection every 3 weeks, follow-up in 6 weeks.   Eileen Stanford, NP 3/12/20251:49 PM

## 2023-04-09 ENCOUNTER — Other Ambulatory Visit: Payer: Self-pay | Admitting: Pharmacy Technician

## 2023-04-09 ENCOUNTER — Other Ambulatory Visit: Payer: Self-pay

## 2023-04-09 NOTE — Progress Notes (Signed)
 Disenrolled; Patient is Buy/Bill. Pt got inject on 02/24/2023

## 2023-04-11 ENCOUNTER — Other Ambulatory Visit: Payer: Self-pay | Admitting: Family Medicine

## 2023-04-11 DIAGNOSIS — M549 Dorsalgia, unspecified: Secondary | ICD-10-CM

## 2023-04-22 ENCOUNTER — Inpatient Hospital Stay: Attending: Family Medicine

## 2023-04-22 ENCOUNTER — Inpatient Hospital Stay

## 2023-04-22 VITALS — BP 126/64 | HR 78 | Resp 18

## 2023-04-22 DIAGNOSIS — D631 Anemia in chronic kidney disease: Secondary | ICD-10-CM

## 2023-04-22 DIAGNOSIS — N184 Chronic kidney disease, stage 4 (severe): Secondary | ICD-10-CM | POA: Insufficient documentation

## 2023-04-22 DIAGNOSIS — D509 Iron deficiency anemia, unspecified: Secondary | ICD-10-CM

## 2023-04-22 LAB — CMP (CANCER CENTER ONLY)
ALT: 9 U/L (ref 0–44)
AST: 11 U/L — ABNORMAL LOW (ref 15–41)
Albumin: 4.4 g/dL (ref 3.5–5.0)
Alkaline Phosphatase: 39 U/L (ref 38–126)
Anion gap: 7 (ref 5–15)
BUN: 43 mg/dL — ABNORMAL HIGH (ref 8–23)
CO2: 34 mmol/L — ABNORMAL HIGH (ref 22–32)
Calcium: 10 mg/dL (ref 8.9–10.3)
Chloride: 101 mmol/L (ref 98–111)
Creatinine: 1.65 mg/dL — ABNORMAL HIGH (ref 0.44–1.00)
GFR, Estimated: 31 mL/min — ABNORMAL LOW (ref >60)
Glucose, Bld: 106 mg/dL — ABNORMAL HIGH (ref 70–99)
Potassium: 4.7 mmol/L (ref 3.5–5.1)
Sodium: 142 mmol/L (ref 135–145)
Total Bilirubin: 0.5 mg/dL (ref 0.0–1.2)
Total Protein: 6.8 g/dL (ref 6.5–8.1)

## 2023-04-22 LAB — CBC WITH DIFFERENTIAL (CANCER CENTER ONLY)
Abs Immature Granulocytes: 0.01 10*3/uL (ref 0.00–0.07)
Basophils Absolute: 0 10*3/uL (ref 0.0–0.1)
Basophils Relative: 0 %
Eosinophils Absolute: 0.3 10*3/uL (ref 0.0–0.5)
Eosinophils Relative: 6 %
HCT: 34 % — ABNORMAL LOW (ref 36.0–46.0)
Hemoglobin: 10.3 g/dL — ABNORMAL LOW (ref 12.0–15.0)
Immature Granulocytes: 0 %
Lymphocytes Relative: 18 %
Lymphs Abs: 1 10*3/uL (ref 0.7–4.0)
MCH: 30.5 pg (ref 26.0–34.0)
MCHC: 30.3 g/dL (ref 30.0–36.0)
MCV: 100.6 fL — ABNORMAL HIGH (ref 80.0–100.0)
Monocytes Absolute: 0.7 10*3/uL (ref 0.1–1.0)
Monocytes Relative: 13 %
Neutro Abs: 3.4 10*3/uL (ref 1.7–7.7)
Neutrophils Relative %: 63 %
Platelet Count: 116 10*3/uL — ABNORMAL LOW (ref 150–400)
RBC: 3.38 MIL/uL — ABNORMAL LOW (ref 3.87–5.11)
RDW: 15.5 % (ref 11.5–15.5)
WBC Count: 5.5 10*3/uL (ref 4.0–10.5)
nRBC: 0 % (ref 0.0–0.2)

## 2023-04-22 MED ORDER — EPOETIN ALFA-EPBX 40000 UNIT/ML IJ SOLN
40000.0000 [IU] | Freq: Once | INTRAMUSCULAR | Status: AC
  Administered 2023-04-22: 40000 [IU] via SUBCUTANEOUS
  Filled 2023-04-22: qty 1

## 2023-04-22 NOTE — Patient Instructions (Signed)

## 2023-05-13 ENCOUNTER — Inpatient Hospital Stay

## 2023-05-13 ENCOUNTER — Inpatient Hospital Stay (HOSPITAL_BASED_OUTPATIENT_CLINIC_OR_DEPARTMENT_OTHER): Admitting: Family

## 2023-05-13 ENCOUNTER — Encounter: Payer: Self-pay | Admitting: Family

## 2023-05-13 VITALS — BP 115/49 | HR 80 | Temp 98.6°F | Resp 19 | Wt 172.0 lb

## 2023-05-13 DIAGNOSIS — D631 Anemia in chronic kidney disease: Secondary | ICD-10-CM

## 2023-05-13 DIAGNOSIS — D509 Iron deficiency anemia, unspecified: Secondary | ICD-10-CM

## 2023-05-13 DIAGNOSIS — N184 Chronic kidney disease, stage 4 (severe): Secondary | ICD-10-CM

## 2023-05-13 LAB — CBC WITH DIFFERENTIAL (CANCER CENTER ONLY)
Abs Immature Granulocytes: 0.07 10*3/uL (ref 0.00–0.07)
Basophils Absolute: 0 10*3/uL (ref 0.0–0.1)
Basophils Relative: 0 %
Eosinophils Absolute: 0.2 10*3/uL (ref 0.0–0.5)
Eosinophils Relative: 4 %
HCT: 33.9 % — ABNORMAL LOW (ref 36.0–46.0)
Hemoglobin: 10.3 g/dL — ABNORMAL LOW (ref 12.0–15.0)
Immature Granulocytes: 1 %
Lymphocytes Relative: 15 %
Lymphs Abs: 0.8 10*3/uL (ref 0.7–4.0)
MCH: 31.1 pg (ref 26.0–34.0)
MCHC: 30.4 g/dL (ref 30.0–36.0)
MCV: 102.4 fL — ABNORMAL HIGH (ref 80.0–100.0)
Monocytes Absolute: 0.6 10*3/uL (ref 0.1–1.0)
Monocytes Relative: 11 %
Neutro Abs: 3.7 10*3/uL (ref 1.7–7.7)
Neutrophils Relative %: 69 %
Platelet Count: 118 10*3/uL — ABNORMAL LOW (ref 150–400)
RBC: 3.31 MIL/uL — ABNORMAL LOW (ref 3.87–5.11)
RDW: 15.9 % — ABNORMAL HIGH (ref 11.5–15.5)
WBC Count: 5.4 10*3/uL (ref 4.0–10.5)
nRBC: 0 % (ref 0.0–0.2)

## 2023-05-13 LAB — CMP (CANCER CENTER ONLY)
ALT: 9 U/L (ref 0–44)
AST: 12 U/L — ABNORMAL LOW (ref 15–41)
Albumin: 4.3 g/dL (ref 3.5–5.0)
Alkaline Phosphatase: 36 U/L — ABNORMAL LOW (ref 38–126)
Anion gap: 7 (ref 5–15)
BUN: 40 mg/dL — ABNORMAL HIGH (ref 8–23)
CO2: 35 mmol/L — ABNORMAL HIGH (ref 22–32)
Calcium: 10 mg/dL (ref 8.9–10.3)
Chloride: 101 mmol/L (ref 98–111)
Creatinine: 1.72 mg/dL — ABNORMAL HIGH (ref 0.44–1.00)
GFR, Estimated: 29 mL/min — ABNORMAL LOW (ref >60)
Glucose, Bld: 109 mg/dL — ABNORMAL HIGH (ref 70–99)
Potassium: 4.7 mmol/L (ref 3.5–5.1)
Sodium: 143 mmol/L (ref 135–145)
Total Bilirubin: 0.5 mg/dL (ref 0.0–1.2)
Total Protein: 6.5 g/dL (ref 6.5–8.1)

## 2023-05-13 LAB — RETICULOCYTES
Immature Retic Fract: 5.4 % (ref 2.3–15.9)
RBC.: 3.35 MIL/uL — ABNORMAL LOW (ref 3.87–5.11)
Retic Count, Absolute: 21.8 10*3/uL (ref 19.0–186.0)
Retic Ct Pct: 0.7 % (ref 0.4–3.1)

## 2023-05-13 LAB — FERRITIN: Ferritin: 547 ng/mL — ABNORMAL HIGH (ref 11–307)

## 2023-05-13 MED ORDER — EPOETIN ALFA-EPBX 40000 UNIT/ML IJ SOLN
40000.0000 [IU] | Freq: Once | INTRAMUSCULAR | Status: AC
  Administered 2023-05-13: 40000 [IU] via SUBCUTANEOUS
  Filled 2023-05-13: qty 1

## 2023-05-13 NOTE — Progress Notes (Signed)
 Hematology and Oncology Follow Up Visit  [name] [id number] May 01, 1940 83 y.o. 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 Hgb is stable at 10.3, MCV 102 and platelets 118. She is fatigued at times but overall feels that she is doing well.  She stayed busy with family and friends this past weekend for Easter and notes that her chronic swelling in the feet and ankles is a little more prominent. Pedal pulses are 1+.  She has not noted any blood loss. No bruising or petechiae.  SOB stable at this time. She is on 2L [place] supplemental O2 24 hrs a day.  No fever, chills, n/v, cough, rash, dizziness,chest pain, palpitations, abdominal pain or changes in bowel or bladder habits.  No falls or syncope reported.  Appetite and hydration are good. Weight is stable at 172 lbs.   ECOG Performance Status: 1 - Symptomatic but completely ambulatory  Medications:  Allergies as of 05/13/2023       Reactions   Montelukast  Sodium Palpitations, Other (See Comments)   REACTION: HEART PALPITATIONS, CHEST PAIN.   Sulfa Antibiotics Other (See Comments)   CHEST PAIN   Sulfonamide Derivatives Other (See Comments)   CHEST PAIN   Ace Inhibitors Other (See Comments)   PT. STATED UNKNOWN REACTION   Indomethacin Other (See Comments)   PT. STATED UNKNOWN REACTION        Medication List        Accurate as of May 13, 2023  1:27 PM. If you have any questions, ask your nurse or doctor.          Accu-Chek Aviva Soln USE AS DIRECTED WITH GLUCOMETER   Accu-Chek Guide test strip Generic drug: glucose blood TEST BLOOD SUGAR EVERY DAY AND AS NEEDED   Accu-Chek Guide w/Device Kit USE AS DIRECTED TO CHECK BLOOD SUGAR   Accu-Chek Softclix Lancets lancets TEST BLOOD SUGAR EVERY DAY AND AS  NEEDED   albuterol  108 (90 Base) MCG/ACT inhaler Commonly known as: VENTOLIN  HFA INHALE 2 PUFFS EVERY 4 (FOUR) HOURS AS NEEDED FOR WHEEZING OR SHORTNESS OF BREATH.   allopurinol  100 MG tablet Commonly known as: ZYLOPRIM  TAKE 2 TABLETS ONE TIME DAILY   b complex vitamins tablet Take 1 tablet by mouth daily.   cyanocobalamin  1000 MCG tablet Commonly known as: VITAMIN B12 Take 1,000 mcg by mouth daily.   docusate sodium  100 MG capsule Commonly known as: COLACE Take 1 capsule (100 mg total) by mouth 2 (two) times daily.   DropSafe Alcohol  Prep 70 % Pads USE AS DIRECTED  BEFORE  CHECKING BLOOD SUGAR   fluticasone  50 MCG/ACT nasal spray Commonly known as: FLONASE  USE 2 SPRAYS IN BOTH NOSTRILS DAILY   furosemide  40 MG tablet Commonly known as: LASIX  TAKE 1 TABLET DAILY , TAKE SECOND TABLET DAILY AS NEEDED FOR INCREASED EDEMA OR WEIGHT GAIN OVER 3 LBS/24 HRS   ipratropium-albuterol  0.5-2.5 (3) MG/3ML Soln Commonly known as: DUONEB Take 3 mLs by nebulization every 4 (four) hours as needed.   loratadine  10 MG tablet Commonly known as: CLARITIN  Take 1 tablet (10 mg total) by mouth at bedtime. What changed:  when to take this reasons to take this   metoprolol  succinate 25 MG 24 hr tablet Commonly known as:  TOPROL -XL Take 12.5 mg by mouth daily.   OXYGEN  Inhale 2 L into the lungs continuous.   pantoprazole  40 MG tablet Commonly known as: PROTONIX  TAKE 1 TABLET EVERY DAY   polyethylene glycol 17 g packet Commonly known as: MIRALAX  / GLYCOLAX  Take 17 g by mouth daily.   pravastatin  40 MG tablet Commonly known as: PRAVACHOL  TAKE 1 TABLET EVERY DAY   PROBIOTIC PO Take 1 tablet by mouth daily.   Prolia  60 MG/ML Sosy injection Generic drug: denosumab  To be given in office.  Pt has appt 08/15/22.  Dx code: M81.0   Symbicort  160-4.5 MCG/ACT inhaler Generic drug: budesonide -formoterol  INHALE 2 PUFFS TWICE DAILY IN THE MORNING AND AT BEDTIME   traMADol  50 MG  tablet Commonly known as: ULTRAM  TAKE 1 TABLET BY MOUTH EVERY 6 HOURS AS NEEDED   trolamine salicylate 10 % cream Commonly known as: ASPERCREME Apply 1 application topically as needed for muscle pain.        Allergies:  Allergies  Allergen Reactions   Montelukast  Sodium Palpitations and Other (See Comments)    REACTION: HEART PALPITATIONS, CHEST PAIN.   Sulfa Antibiotics Other (See Comments)    CHEST PAIN   Sulfonamide Derivatives Other (See Comments)    CHEST PAIN   Ace Inhibitors Other (See Comments)    PT. STATED UNKNOWN REACTION   Indomethacin Other (See Comments)    PT. STATED UNKNOWN REACTION    Past Medical History, Surgical history, Social history, and Family History were reviewed and updated.  Review of Systems: All other 10 point review of systems is negative.   Physical Exam:  weight is 172 lb (78 kg). Her oral temperature is 98.6 F (37 C). Her blood pressure is 115/49 (abnormal) and her pulse is 80. Her respiration is 19 and oxygen  saturation is 96%.   Wt Readings from Last 3 Encounters:  05/13/23 172 lb (78 kg)  04/01/23 168 lb 12.8 oz (76.6 kg)  03/23/23 169 lb (76.7 kg)    Ocular: Sclerae unicteric, pupils equal, round and reactive to light Ear-nose-throat: Oropharynx clear, dentition fair Lymphatic: No cervical or supraclavicular adenopathy Lungs no rales or rhonchi, good excursion bilaterally Heart regular rate and rhythm, no murmur appreciated Abd soft, nontender, positive bowel sounds MSK no focal spinal tenderness, no joint edema Neuro: non-focal, well-oriented, appropriate affect Breasts: Deferred   Lab Results  Component Value Date   WBC 5.4 05/13/2023   HGB 10.3 (L) 05/13/2023   HCT 33.9 (L) 05/13/2023   MCV 102.4 (H) 05/13/2023   PLT 118 (L) 05/13/2023   Lab Results  Component Value Date   FERRITIN 336 (H) 04/01/2023   IRON  76 04/01/2023   TIBC 266 04/01/2023   UIBC 190 04/01/2023   IRONPCTSAT 29 04/01/2023   Lab Results   Component Value Date   RETICCTPCT 0.7 05/13/2023   RBC 3.35 (L) 05/13/2023   No results found for: "KPAFRELGTCHN", "LAMBDASER", "KAPLAMBRATIO" No results found for: "IGGSERUM", "IGA", "IGMSERUM" No results found for: "TOTALPROTELP", "ALBUMINELP", "A1GS", "A2GS", "BETS", "BETA2SER", "GAMS", "MSPIKE", "SPEI"   Chemistry      Component Value Date/Time   NA 142 04/22/2023 1310   NA 142 09/24/2022 1118   K 4.7 04/22/2023 1310   CL 101 04/22/2023 1310   CO2 34 (H) 04/22/2023 1310   BUN 43 (H) 04/22/2023 1310   BUN 52 (H) 09/24/2022 1118   CREATININE 1.65 (H) 04/22/2023 1310   CREATININE 1.36 (H) 08/23/2013 1134      Component Value Date/Time  CALCIUM 10.0 04/22/2023 1310   ALKPHOS 39 04/22/2023 1310   AST 11 (L) 04/22/2023 1310   ALT 9 04/22/2023 1310   BILITOT 0.5 04/22/2023 1310       Impression and Plan:  Ms. Vokes is a very pleasant 83 yo caucasian female with multifactorial anemia.  ESA given for Hgb 10.3.  Iron  studies pending.  Lab and injection every 3 weeks, follow-up in 6 weeks.   Kennard Pea, NP 4/23/20251:27 PM

## 2023-05-14 LAB — IRON AND IRON BINDING CAPACITY (CC-WL,HP ONLY)
Iron: 109 ug/dL (ref 28–170)
Saturation Ratios: 43 % — ABNORMAL HIGH (ref 10.4–31.8)
TIBC: 253 ug/dL (ref 250–450)
UIBC: 144 ug/dL — ABNORMAL LOW (ref 148–442)

## 2023-05-19 ENCOUNTER — Encounter: Payer: Self-pay | Admitting: Family Medicine

## 2023-05-19 ENCOUNTER — Ambulatory Visit (INDEPENDENT_AMBULATORY_CARE_PROVIDER_SITE_OTHER): Admitting: Family Medicine

## 2023-05-19 VITALS — BP 126/70 | HR 100 | Resp 18 | Ht 62.0 in | Wt 175.0 lb

## 2023-05-19 DIAGNOSIS — F4323 Adjustment disorder with mixed anxiety and depressed mood: Secondary | ICD-10-CM | POA: Diagnosis not present

## 2023-05-19 DIAGNOSIS — I10 Essential (primary) hypertension: Secondary | ICD-10-CM

## 2023-05-19 MED ORDER — SERTRALINE HCL 50 MG PO TABS: 50.0000 mg | ORAL_TABLET | Freq: Every day | ORAL | 3 refills | Status: DC

## 2023-05-19 NOTE — Assessment & Plan Note (Signed)
 Reports depression, anxiety, panic attacks. Sertraline  previously effective. No suicidal ideation. Family stressors present. - Start Zoloft  50 mg daily (1/2 tab for the first week) - Lifestyle measures encouraged  - Mood follow-up in 4-6 weeks.

## 2023-05-19 NOTE — Assessment & Plan Note (Signed)
 Blood pressure variable, anxiety may contribute. Current medications include Metoprolol  and Lasix . Well controlled today. - Continue current meds. - Monitor blood pressure daily when relaxed. - Log blood pressure readings, especially 1-2 hours post-medication. - Bring log to next appointment.

## 2023-05-19 NOTE — Patient Instructions (Addendum)
-   Start sertraline  25 mg daily for 1 week, then increase to 50 mg daily. - Follow up in 4-6 weeks to reassess mood.  - Monitor blood pressure once a day, about 1-2 hours after taking your daily medications when you are relaxed.

## 2023-05-19 NOTE — Progress Notes (Signed)
 Acute Office Visit  Subjective:     Patient ID: [name], female    DOB: [birth year], 83 y.o.   MRN: [id number]  Chief Complaint  Patient presents with   Hypertension    Elevated at home readings   Depression    Patient is in today for mood concerns and elevated BP at home.    Discussed the use of AI scribe software for clinical note transcription with the patient, who gave verbal consent to proceed.  History of Present Illness [name] "Keith" is an 83 year old female with hypertension and congestive heart failure who presents with blood pressure concerns and anxiety/depression.  She has been experiencing variable blood pressure readings at home over the past two weeks, with some high readings such as 156/93 mmHg and 143/88 mmHg, and some normal readings like 128/76 mmHg. She attributes some of the high readings to panic attacks and anxiety, noting that she feels stressed.  She describes experiencing panic attacks and feelings of depression over the last couple of weeks. She feels scared, with symptoms of her heart racing and an inability to settle down. She is not currently taking any medication for anxiety but previously took sertraline  at a dose of 100 mg, which she reduced and eventually stopped. She wants medication to manage her symptoms.  Her social history includes feeling isolated from her family, particularly her grandchildren and great-grandchildren. She visits her great-granddaughter weekly and recently took her shopping, which she describes as a positive experience. No thoughts of self-harm, but she feels down, anxious, and overwhelmed. She experiences panic attacks with symptoms of heart racing and inability to settle down.  Patient denies any chest pain, palpitations, dyspnea, wheezing, edema, recurrent headaches, vision changes.      All review of systems negative except what is listed in the HPI      Objective:    BP 126/70   Pulse 100   Resp  18   Ht 5\' 2"  (1.575 m)   Wt 175 lb (79.4 kg)   SpO2 96%   BMI 32.01 kg/m    Physical Exam Vitals reviewed.  Constitutional:      Appearance: Normal appearance.  Cardiovascular:     Rate and Rhythm: Normal rate and regular rhythm.     Comments: ? Faint murmur Pulmonary:     Effort: Pulmonary effort is normal.     Breath sounds: Normal breath sounds. No wheezing, rhonchi or rales.     Comments: Nasal canula in place Skin:    General: Skin is warm and dry.     Comments: Chronic BLE discoloration  Neurological:     Mental Status: She is alert and oriented to person, place, and time.  Psychiatric:        Mood and Affect: Mood normal.        Behavior: Behavior normal.        Thought Content: Thought content normal.        Judgment: Judgment normal.     No results found for any visits on 05/19/23.      Assessment & Plan:   Problem List Items Addressed This Visit       Active Problems   Essential hypertension (Chronic)   Blood pressure variable, anxiety may contribute. Current medications include Metoprolol  and Lasix . Well controlled today. - Continue current meds. - Monitor blood pressure daily when relaxed. - Log blood pressure readings, especially 1-2 hours post-medication. - Bring log to next appointment.  ADJ DISORDER WITH MIXED ANXIETY & DEPRESSED MOOD - Primary   Reports depression, anxiety, panic attacks. Sertraline  previously effective. No suicidal ideation. Family stressors present. - Start Zoloft  50 mg daily (1/2 tab for the first week) - Lifestyle measures encouraged  - Mood follow-up in 4-6 weeks.       Relevant Medications   sertraline  (ZOLOFT ) 50 MG tablet    Meds ordered this encounter  Medications   sertraline  (ZOLOFT ) 50 MG tablet    Sig: Take 1 tablet (50 mg total) by mouth daily. Start with 1/2 tablet for the first week.    Dispense:  30 tablet    Refill:  3    Supervising Provider:   Randie Bustle A [[id number]]        Return in  about 6 weeks (around 06/30/2023) for mood follow-up.  Everlina Hock, NP

## 2023-05-20 ENCOUNTER — Other Ambulatory Visit: Payer: Self-pay | Admitting: Family

## 2023-05-20 DIAGNOSIS — M549 Dorsalgia, unspecified: Secondary | ICD-10-CM

## 2023-05-27 NOTE — Telephone Encounter (Signed)
 Looks like this request was sent to [facility]. Please advise. Last filled in March for #90 no refills.   Copied from CRM [id number]. Topic: Clinical - Prescription Issue >> May 27, 2023 11:47 AM Concetta Dee E wrote: Reason for CRM: PT Calling to follow up regarding traMADol  (ULTRAM ) 50 MG tablet prescription refill that was sent out for refill on 05/20/2022. Pt stated pharmacy is waiting on request to be sent back , [id number] - [place] ([place]), [place] - [address].  Pt will like to follow up on my chart regarding prescriptions.

## 2023-05-28 ENCOUNTER — Telehealth: Payer: Self-pay

## 2023-05-28 ENCOUNTER — Encounter: Payer: Self-pay | Admitting: Neurology

## 2023-05-28 NOTE — Telephone Encounter (Signed)
 Pt called in stating that she is SOB and unable to get in with her pulmonologist and would like to know if we could see her for this. Spoke with pt and she stated that she has had a cold this past week and is coughing up clear phlegm. Declines fever or any other s/s. Previous iron  studies and Hgb was ok per Dr E. Pt advised to f/u with PCP as they would be better to treat these symptoms/illness. Pt agreed and stated she had called the wrong office.

## 2023-05-29 DIAGNOSIS — D631 Anemia in chronic kidney disease: Secondary | ICD-10-CM | POA: Diagnosis not present

## 2023-05-29 DIAGNOSIS — E1122 Type 2 diabetes mellitus with diabetic chronic kidney disease: Secondary | ICD-10-CM | POA: Diagnosis not present

## 2023-05-29 DIAGNOSIS — I129 Hypertensive chronic kidney disease with stage 1 through stage 4 chronic kidney disease, or unspecified chronic kidney disease: Secondary | ICD-10-CM | POA: Diagnosis not present

## 2023-05-29 DIAGNOSIS — N184 Chronic kidney disease, stage 4 (severe): Secondary | ICD-10-CM | POA: Diagnosis not present

## 2023-06-03 ENCOUNTER — Inpatient Hospital Stay

## 2023-06-03 ENCOUNTER — Inpatient Hospital Stay: Attending: Family Medicine

## 2023-06-03 VITALS — BP 130/74 | HR 100 | Temp 98.4°F | Resp 20

## 2023-06-03 DIAGNOSIS — D509 Iron deficiency anemia, unspecified: Secondary | ICD-10-CM

## 2023-06-03 DIAGNOSIS — D631 Anemia in chronic kidney disease: Secondary | ICD-10-CM | POA: Insufficient documentation

## 2023-06-03 DIAGNOSIS — N184 Chronic kidney disease, stage 4 (severe): Secondary | ICD-10-CM | POA: Insufficient documentation

## 2023-06-03 LAB — CMP (CANCER CENTER ONLY)
ALT: 9 U/L (ref 0–44)
AST: 12 U/L — ABNORMAL LOW (ref 15–41)
Albumin: 4.5 g/dL (ref 3.5–5.0)
Alkaline Phosphatase: 41 U/L (ref 38–126)
Anion gap: 8 (ref 5–15)
BUN: 45 mg/dL — ABNORMAL HIGH (ref 8–23)
CO2: 33 mmol/L — ABNORMAL HIGH (ref 22–32)
Calcium: 10.3 mg/dL (ref 8.9–10.3)
Chloride: 100 mmol/L (ref 98–111)
Creatinine: 1.83 mg/dL — ABNORMAL HIGH (ref 0.44–1.00)
GFR, Estimated: 27 mL/min — ABNORMAL LOW (ref >60)
Glucose, Bld: 129 mg/dL — ABNORMAL HIGH (ref 70–99)
Potassium: 4.8 mmol/L (ref 3.5–5.1)
Sodium: 141 mmol/L (ref 135–145)
Total Bilirubin: 0.5 mg/dL (ref 0.0–1.2)
Total Protein: 6.7 g/dL (ref 6.5–8.1)

## 2023-06-03 LAB — CBC WITH DIFFERENTIAL (CANCER CENTER ONLY)
Abs Immature Granulocytes: 0.02 10*3/uL (ref 0.00–0.07)
Basophils Absolute: 0 10*3/uL (ref 0.0–0.1)
Basophils Relative: 1 %
Eosinophils Absolute: 0.3 10*3/uL (ref 0.0–0.5)
Eosinophils Relative: 5 %
HCT: 33.6 % — ABNORMAL LOW (ref 36.0–46.0)
Hemoglobin: 10.5 g/dL — ABNORMAL LOW (ref 12.0–15.0)
Immature Granulocytes: 0 %
Lymphocytes Relative: 14 %
Lymphs Abs: 0.8 10*3/uL (ref 0.7–4.0)
MCH: 31.1 pg (ref 26.0–34.0)
MCHC: 31.3 g/dL (ref 30.0–36.0)
MCV: 99.4 fL (ref 80.0–100.0)
Monocytes Absolute: 0.7 10*3/uL (ref 0.1–1.0)
Monocytes Relative: 12 %
Neutro Abs: 3.9 10*3/uL (ref 1.7–7.7)
Neutrophils Relative %: 68 %
Platelet Count: 109 10*3/uL — ABNORMAL LOW (ref 150–400)
RBC: 3.38 MIL/uL — ABNORMAL LOW (ref 3.87–5.11)
RDW: 15.9 % — ABNORMAL HIGH (ref 11.5–15.5)
WBC Count: 5.6 10*3/uL (ref 4.0–10.5)
nRBC: 0 % (ref 0.0–0.2)

## 2023-06-03 MED ORDER — EPOETIN ALFA-EPBX 40000 UNIT/ML IJ SOLN
40000.0000 [IU] | Freq: Once | INTRAMUSCULAR | Status: AC
  Administered 2023-06-03: 40000 [IU] via SUBCUTANEOUS
  Filled 2023-06-03: qty 1

## 2023-06-03 NOTE — Patient Instructions (Signed)

## 2023-06-24 ENCOUNTER — Encounter: Payer: Self-pay | Admitting: Family

## 2023-06-24 ENCOUNTER — Inpatient Hospital Stay

## 2023-06-24 ENCOUNTER — Inpatient Hospital Stay: Attending: Family Medicine | Admitting: Family

## 2023-06-24 VITALS — BP 124/59 | HR 101 | Temp 98.4°F | Resp 20 | Ht 62.0 in | Wt 169.4 lb

## 2023-06-24 DIAGNOSIS — D631 Anemia in chronic kidney disease: Secondary | ICD-10-CM | POA: Insufficient documentation

## 2023-06-24 DIAGNOSIS — N184 Chronic kidney disease, stage 4 (severe): Secondary | ICD-10-CM | POA: Diagnosis not present

## 2023-06-24 DIAGNOSIS — D509 Iron deficiency anemia, unspecified: Secondary | ICD-10-CM

## 2023-06-24 LAB — CMP (CANCER CENTER ONLY)
ALT: 7 U/L (ref 0–44)
AST: 11 U/L — ABNORMAL LOW (ref 15–41)
Albumin: 4.4 g/dL (ref 3.5–5.0)
Alkaline Phosphatase: 48 U/L (ref 38–126)
Anion gap: 8 (ref 5–15)
BUN: 37 mg/dL — ABNORMAL HIGH (ref 8–23)
CO2: 34 mmol/L — ABNORMAL HIGH (ref 22–32)
Calcium: 9.6 mg/dL (ref 8.9–10.3)
Chloride: 99 mmol/L (ref 98–111)
Creatinine: 1.66 mg/dL — ABNORMAL HIGH (ref 0.44–1.00)
GFR, Estimated: 31 mL/min — ABNORMAL LOW (ref >60)
Glucose, Bld: 151 mg/dL — ABNORMAL HIGH (ref 70–99)
Potassium: 4.3 mmol/L (ref 3.5–5.1)
Sodium: 141 mmol/L (ref 135–145)
Total Bilirubin: 0.8 mg/dL (ref 0.0–1.2)
Total Protein: 7 g/dL (ref 6.5–8.1)

## 2023-06-24 LAB — CBC WITH DIFFERENTIAL (CANCER CENTER ONLY)
Abs Immature Granulocytes: 0.06 10*3/uL (ref 0.00–0.07)
Basophils Absolute: 0 10*3/uL (ref 0.0–0.1)
Basophils Relative: 0 %
Eosinophils Absolute: 0.2 10*3/uL (ref 0.0–0.5)
Eosinophils Relative: 4 %
HCT: 33.5 % — ABNORMAL LOW (ref 36.0–46.0)
Hemoglobin: 10.3 g/dL — ABNORMAL LOW (ref 12.0–15.0)
Immature Granulocytes: 1 %
Lymphocytes Relative: 10 %
Lymphs Abs: 0.7 10*3/uL (ref 0.7–4.0)
MCH: 30.4 pg (ref 26.0–34.0)
MCHC: 30.7 g/dL (ref 30.0–36.0)
MCV: 98.8 fL (ref 80.0–100.0)
Monocytes Absolute: 0.8 10*3/uL (ref 0.1–1.0)
Monocytes Relative: 11 %
Neutro Abs: 5 10*3/uL (ref 1.7–7.7)
Neutrophils Relative %: 74 %
Platelet Count: 126 10*3/uL — ABNORMAL LOW (ref 150–400)
RBC: 3.39 MIL/uL — ABNORMAL LOW (ref 3.87–5.11)
RDW: 15.2 % (ref 11.5–15.5)
WBC Count: 6.7 10*3/uL (ref 4.0–10.5)
nRBC: 0 % (ref 0.0–0.2)

## 2023-06-24 LAB — IRON AND IRON BINDING CAPACITY (CC-WL,HP ONLY)
Iron: 66 ug/dL (ref 28–170)
Saturation Ratios: 27 % (ref 10.4–31.8)
TIBC: 246 ug/dL — ABNORMAL LOW (ref 250–450)
UIBC: 180 ug/dL (ref 148–442)

## 2023-06-24 LAB — RETICULOCYTES
Immature Retic Fract: 9 % (ref 2.3–15.9)
RBC.: 3.35 MIL/uL — ABNORMAL LOW (ref 3.87–5.11)
Retic Count, Absolute: 25.5 10*3/uL (ref 19.0–186.0)
Retic Ct Pct: 0.8 % (ref 0.4–3.1)

## 2023-06-24 LAB — FERRITIN: Ferritin: 705 ng/mL — ABNORMAL HIGH (ref 11–307)

## 2023-06-24 MED ORDER — EPOETIN ALFA-EPBX 40000 UNIT/ML IJ SOLN
40000.0000 [IU] | Freq: Once | INTRAMUSCULAR | Status: AC
  Administered 2023-06-24: 40000 [IU] via SUBCUTANEOUS
  Filled 2023-06-24: qty 1

## 2023-06-24 NOTE — Patient Instructions (Signed)

## 2023-06-24 NOTE — Progress Notes (Signed)
 Hematology and Oncology Follow Up Visit  [name] [id number] 12-May-1940 83 y.o. 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 is here today for follow-up. She had a fall back in May on Mother's Day and has bruises to the left side of her head and neck that appear to be healing nicely. She also has a small goose egg on the left side of her head. She denies any neurological symptoms.  No dizziness, n/v or vision changes/loss.  She states that she tripped in the shoes she was wearing and fell. No syncope or loss of consciousness after falling. Her niece and nephew came and helped her up. She did not call EMS.  She is oriented to person, place and time today. No obvious neurological deficit.  No fever, chills, n/v, cough, rash, dizziness, SOB, chest pain, palpitations, abdominal pain or changes in bowel or bladder habits.  Chronic swelling her lower extremities is unchanged from baseline. Pedal pulses are 1+.  Appetite is good per patient and she is doing her best to stay well hydrated. Her weight is stable at 169 lbs.   ECOG Performance Status: 1 - Symptomatic but completely ambulatory  Medications:  Allergies as of 06/24/2023       Reactions   Montelukast  Sodium Palpitations, Other (See Comments)   REACTION: HEART PALPITATIONS, CHEST PAIN.   Sulfa Antibiotics Other (See Comments)   CHEST PAIN   Sulfonamide Derivatives Other (See Comments)   CHEST PAIN   Ace Inhibitors Other (See Comments)   PT. STATED UNKNOWN REACTION   Indomethacin Other (See Comments)   PT. STATED UNKNOWN REACTION        Medication List        Accurate as of June 24, 2023 12:56 PM. If you have any questions, ask your nurse or doctor.          Accu-Chek Aviva Soln USE AS DIRECTED WITH GLUCOMETER   Accu-Chek Guide test strip Generic  drug: glucose blood TEST BLOOD SUGAR EVERY DAY AND AS NEEDED   Accu-Chek Guide w/Device Kit USE AS DIRECTED TO CHECK BLOOD SUGAR   Accu-Chek Softclix Lancets lancets TEST BLOOD SUGAR EVERY DAY AND AS NEEDED   albuterol  108 (90 Base) MCG/ACT inhaler Commonly known as: VENTOLIN  HFA INHALE 2 PUFFS EVERY 4 (FOUR) HOURS AS NEEDED FOR WHEEZING OR SHORTNESS OF BREATH.   allopurinol  100 MG tablet Commonly known as: ZYLOPRIM  TAKE 2 TABLETS ONE TIME DAILY   b complex vitamins tablet Take 1 tablet by mouth daily.   cyanocobalamin  1000 MCG tablet Commonly known as: VITAMIN B12 Take 1,000 mcg by mouth daily.   docusate sodium  100 MG capsule Commonly known as: COLACE Take 1 capsule (100 mg total) by mouth 2 (two) times daily.   DropSafe Alcohol  Prep 70 % Pads USE AS DIRECTED  BEFORE  CHECKING BLOOD SUGAR   fluticasone  50 MCG/ACT nasal spray Commonly known as: FLONASE  USE 2 SPRAYS IN BOTH NOSTRILS DAILY   furosemide  40 MG tablet Commonly known as: LASIX  TAKE 1 TABLET DAILY , TAKE SECOND TABLET DAILY AS NEEDED FOR INCREASED EDEMA OR WEIGHT GAIN OVER 3 LBS/24 HRS   ipratropium-albuterol  0.5-2.5 (3) MG/3ML Soln Commonly known as: DUONEB Take 3 mLs by nebulization every 4 (four) hours as needed.   loratadine  10 MG tablet Commonly  known as: CLARITIN  Take 1 tablet (10 mg total) by mouth at bedtime. What changed:  when to take this reasons to take this   metoprolol  succinate 25 MG 24 hr tablet Commonly known as: TOPROL -XL Take 12.5 mg by mouth daily.   OXYGEN  Inhale 2 L into the lungs continuous.   pantoprazole  40 MG tablet Commonly known as: PROTONIX  TAKE 1 TABLET EVERY DAY   polyethylene glycol 17 g packet Commonly known as: MIRALAX  / GLYCOLAX  Take 17 g by mouth daily.   pravastatin  40 MG tablet Commonly known as: PRAVACHOL  TAKE 1 TABLET EVERY DAY   PROBIOTIC PO Take 1 tablet by mouth daily.   Prolia  60 MG/ML Sosy injection Generic drug: denosumab  To be given in  office.  Pt has appt 08/15/22.  Dx code: M81.0   sertraline  50 MG tablet Commonly known as: ZOLOFT  Take 1 tablet (50 mg total) by mouth daily. Start with 1/2 tablet for the first week.   Symbicort  160-4.5 MCG/ACT inhaler Generic drug: budesonide -formoterol  INHALE 2 PUFFS TWICE DAILY IN THE MORNING AND AT BEDTIME   traMADol  50 MG tablet Commonly known as: ULTRAM  TAKE 1 TABLET BY MOUTH EVERY 6 HOURS AS NEEDED   trolamine salicylate 10 % cream Commonly known as: ASPERCREME Apply 1 application topically as needed for muscle pain.        Allergies:  Allergies  Allergen Reactions   Montelukast  Sodium Palpitations and Other (See Comments)    REACTION: HEART PALPITATIONS, CHEST PAIN.   Sulfa Antibiotics Other (See Comments)    CHEST PAIN   Sulfonamide Derivatives Other (See Comments)    CHEST PAIN   Ace Inhibitors Other (See Comments)    PT. STATED UNKNOWN REACTION   Indomethacin Other (See Comments)    PT. STATED UNKNOWN REACTION    Past Medical History, Surgical history, Social history, and Family History were reviewed and updated.  Review of Systems: All other 10 point review of systems is negative.   Physical Exam:  vitals were not taken for this visit.   Wt Readings from Last 3 Encounters:  05/19/23 175 lb (79.4 kg)  05/13/23 172 lb (78 kg)  04/01/23 168 lb 12.8 oz (76.6 kg)    Ocular: Sclerae unicteric, pupils equal, round and reactive to light Ear-nose-throat: Oropharynx clear, dentition fair Lymphatic: No cervical or supraclavicular adenopathy Lungs no rales or rhonchi, good excursion bilaterally Heart regular rate and rhythm, no murmur appreciated Abd soft, nontender, positive bowel sounds MSK no focal spinal tenderness, no joint edema Neuro: non-focal, well-oriented, appropriate affect Breasts: Deferred   Lab Results  Component Value Date   WBC 5.6 06/03/2023   HGB 10.5 (L) 06/03/2023   HCT 33.6 (L) 06/03/2023   MCV 99.4 06/03/2023   PLT 109 (L)  06/03/2023   Lab Results  Component Value Date   FERRITIN 547 (H) 05/13/2023   IRON  109 05/13/2023   TIBC 253 05/13/2023   UIBC 144 (L) 05/13/2023   IRONPCTSAT 43 (H) 05/13/2023   Lab Results  Component Value Date   RETICCTPCT 0.7 05/13/2023   RBC 3.38 (L) 06/03/2023   No results found for: "KPAFRELGTCHN", "LAMBDASER", "KAPLAMBRATIO" No results found for: "IGGSERUM", "IGA", "IGMSERUM" No results found for: "TOTALPROTELP", "ALBUMINELP", "A1GS", "A2GS", "BETS", "BETA2SER", "GAMS", "MSPIKE", "SPEI"   Chemistry      Component Value Date/Time   NA 141 06/03/2023 1438   NA 142 09/24/2022 1118   K 4.8 06/03/2023 1438   CL 100 06/03/2023 1438   CO2 33 (H) 06/03/2023 1438  BUN 45 (H) 06/03/2023 1438   BUN 52 (H) 09/24/2022 1118   CREATININE 1.83 (H) 06/03/2023 1438   CREATININE 1.36 (H) 08/23/2013 1134      Component Value Date/Time   CALCIUM 10.3 06/03/2023 1438   ALKPHOS 41 06/03/2023 1438   AST 12 (L) 06/03/2023 1438   ALT 9 06/03/2023 1438   BILITOT 0.5 06/03/2023 1438       Impression and Plan: Ms. Umble is a very pleasant 83 yo caucasian female with multifactorial anemia.  ESA given for Hgb 10.3.  Iron  studies pending.  Lab and injection every 3 weeks, follow-up in 6 weeks.   Kennard Pea, NP 6/4/202512:56 PM

## 2023-06-29 ENCOUNTER — Other Ambulatory Visit: Payer: Self-pay | Admitting: Family Medicine

## 2023-06-29 DIAGNOSIS — M549 Dorsalgia, unspecified: Secondary | ICD-10-CM

## 2023-06-29 NOTE — Telephone Encounter (Signed)
 Requesting: Tramadol  50 mg Tablet  Contract: None  UDS: None  Last Visit: 05/19/2023  Next Visit: 06/30/2023  Last Refill: 05/28/2023 #90 with no refills  Please Advise

## 2023-06-30 ENCOUNTER — Ambulatory Visit: Admitting: Family Medicine

## 2023-06-30 ENCOUNTER — Ambulatory Visit (INDEPENDENT_AMBULATORY_CARE_PROVIDER_SITE_OTHER): Admitting: Family Medicine

## 2023-06-30 ENCOUNTER — Encounter: Payer: Self-pay | Admitting: Family Medicine

## 2023-06-30 DIAGNOSIS — F4323 Adjustment disorder with mixed anxiety and depressed mood: Secondary | ICD-10-CM | POA: Diagnosis not present

## 2023-06-30 MED ORDER — SERTRALINE HCL 50 MG PO TABS: 50.0000 mg | ORAL_TABLET | Freq: Every day | ORAL | 1 refills | Status: AC

## 2023-06-30 NOTE — Assessment & Plan Note (Signed)
 Mood and anxiety improved with Sertraline . No self-harm thoughts. Slight serotonin syndrome risk due to Sertraline  and Tramadol  combination - discussed with patient, she would like to keep meds as is.  - Continue Sertraline  50 mg orally daily. - Monitor for serotonin syndrome symptoms. - Use Tramadol  only as needed. - Send 90-day Sertraline  refill.

## 2023-06-30 NOTE — Progress Notes (Signed)
   Established Patient Office Visit  Subjective   Patient ID: [name], female    DOB: [birth year]  Age: 83 y.o. MRN: [id number]  Chief Complaint  Patient presents with   Medical Management of Chronic Issues   Anxiety   Depression      Patient is here for 6-week mood follow-up after restarting Zoloft .   Mood follow-up: - Diagnosis: anxiety/depression - Treatment: Zoloft  50 mg daily  - Medication side effects: none - SI/HI: none - Update: Feeling much better! Would like to continue current regimen.       06/30/2023   12:13 PM 05/19/2023    9:43 AM 10/30/2022    2:35 PM  PHQ9 SCORE ONLY  PHQ-9 Total Score 2 7 1        06/30/2023   12:14 PM 05/19/2023    9:44 AM 04/07/2022    2:52 PM  GAD 7 : Generalized Anxiety Score  Nervous, Anxious, on Edge 0 1 0  Control/stop worrying 0 1 0  Worry too much - different things 1 1 0  Trouble relaxing 0 0 0  Restless 0 0 0  Easily annoyed or irritable 0 1 0  Afraid - awful might happen 0 1 0  Total GAD 7 Score 1 5 0  Anxiety Difficulty Somewhat difficult Somewhat difficult Not difficult at all         All review of systems negative except what is listed in the HPI    Objective:     BP (!) 129/55   Pulse 92   Ht 5\' 2"  (1.575 m)   Wt 168 lb (76.2 kg)   SpO2 94%   BMI 30.73 kg/m    Physical Exam Vitals reviewed.  Constitutional:      Appearance: Normal appearance.  Cardiovascular:     Rate and Rhythm: Normal rate and regular rhythm.     Heart sounds: Murmur heard.  Pulmonary:     Effort: Pulmonary effort is normal.     Breath sounds: Normal breath sounds. No wheezing, rhonchi or rales.     Comments: Nasal canula in place Neurological:     Mental Status: She is alert and oriented to person, place, and time.  Psychiatric:        Mood and Affect: Mood normal.        Behavior: Behavior normal.        Thought Content: Thought content normal.        Judgment: Judgment normal.      No results found for  any visits on 06/30/23.    The ASCVD Risk score (Arnett DK, et al., 2019) failed to calculate for the following reasons:   The 2019 ASCVD risk score is only valid for ages 60 to 38    Assessment & Plan:   Problem List Items Addressed This Visit       Active Problems   ADJ DISORDER WITH MIXED ANXIETY & DEPRESSED MOOD   Mood and anxiety improved with Sertraline . No self-harm thoughts. Slight serotonin syndrome risk due to Sertraline  and Tramadol  combination - discussed with patient, she would like to keep meds as is.  - Continue Sertraline  50 mg orally daily. - Monitor for serotonin syndrome symptoms. - Use Tramadol  only as needed. - Send 90-day Sertraline  refill.      Relevant Medications   sertraline  (ZOLOFT ) 50 MG tablet       Return in about 6 months (around 12/30/2023) for routine follow-up.    Margaret Hock, NP

## 2023-07-15 ENCOUNTER — Inpatient Hospital Stay

## 2023-07-15 VITALS — BP 96/57 | HR 90 | Resp 21

## 2023-07-15 DIAGNOSIS — D509 Iron deficiency anemia, unspecified: Secondary | ICD-10-CM

## 2023-07-15 DIAGNOSIS — D631 Anemia in chronic kidney disease: Secondary | ICD-10-CM | POA: Diagnosis not present

## 2023-07-15 DIAGNOSIS — N184 Chronic kidney disease, stage 4 (severe): Secondary | ICD-10-CM

## 2023-07-15 LAB — CBC WITH DIFFERENTIAL (CANCER CENTER ONLY)
Abs Immature Granulocytes: 0.03 10*3/uL (ref 0.00–0.07)
Basophils Absolute: 0 10*3/uL (ref 0.0–0.1)
Basophils Relative: 0 %
Eosinophils Absolute: 0.4 10*3/uL (ref 0.0–0.5)
Eosinophils Relative: 5 %
HCT: 32.6 % — ABNORMAL LOW (ref 36.0–46.0)
Hemoglobin: 10.1 g/dL — ABNORMAL LOW (ref 12.0–15.0)
Immature Granulocytes: 0 %
Lymphocytes Relative: 9 %
Lymphs Abs: 0.7 10*3/uL (ref 0.7–4.0)
MCH: 30.5 pg (ref 26.0–34.0)
MCHC: 31 g/dL (ref 30.0–36.0)
MCV: 98.5 fL (ref 80.0–100.0)
Monocytes Absolute: 0.8 10*3/uL (ref 0.1–1.0)
Monocytes Relative: 10 %
Neutro Abs: 5.6 10*3/uL (ref 1.7–7.7)
Neutrophils Relative %: 76 %
Platelet Count: 148 10*3/uL — ABNORMAL LOW (ref 150–400)
RBC: 3.31 MIL/uL — ABNORMAL LOW (ref 3.87–5.11)
RDW: 14.8 % (ref 11.5–15.5)
WBC Count: 7.4 10*3/uL (ref 4.0–10.5)
nRBC: 0 % (ref 0.0–0.2)

## 2023-07-15 MED ORDER — EPOETIN ALFA-EPBX 40000 UNIT/ML IJ SOLN
40000.0000 [IU] | Freq: Once | INTRAMUSCULAR | Status: AC
  Administered 2023-07-15: 40000 [IU] via SUBCUTANEOUS
  Filled 2023-07-15: qty 1

## 2023-07-15 NOTE — Patient Instructions (Signed)

## 2023-07-18 ENCOUNTER — Encounter (HOSPITAL_COMMUNITY): Payer: Self-pay | Admitting: Interventional Radiology

## 2023-08-03 ENCOUNTER — Inpatient Hospital Stay: Attending: Family Medicine

## 2023-08-03 ENCOUNTER — Other Ambulatory Visit: Payer: Self-pay | Admitting: Family Medicine

## 2023-08-03 ENCOUNTER — Inpatient Hospital Stay: Attending: Family Medicine | Admitting: Family

## 2023-08-03 ENCOUNTER — Inpatient Hospital Stay

## 2023-08-03 VITALS — BP 111/53 | HR 72 | Temp 98.7°F | Resp 19 | Ht 62.0 in | Wt 168.1 lb

## 2023-08-03 DIAGNOSIS — D509 Iron deficiency anemia, unspecified: Secondary | ICD-10-CM

## 2023-08-03 DIAGNOSIS — D631 Anemia in chronic kidney disease: Secondary | ICD-10-CM

## 2023-08-03 DIAGNOSIS — M549 Dorsalgia, unspecified: Secondary | ICD-10-CM

## 2023-08-03 DIAGNOSIS — N184 Chronic kidney disease, stage 4 (severe): Secondary | ICD-10-CM | POA: Insufficient documentation

## 2023-08-03 LAB — CBC WITH DIFFERENTIAL (CANCER CENTER ONLY)
Abs Immature Granulocytes: 0.01 K/uL (ref 0.00–0.07)
Basophils Absolute: 0 K/uL (ref 0.0–0.1)
Basophils Relative: 1 %
Eosinophils Absolute: 0.3 K/uL (ref 0.0–0.5)
Eosinophils Relative: 5 %
HCT: 33.9 % — ABNORMAL LOW (ref 36.0–46.0)
Hemoglobin: 10.5 g/dL — ABNORMAL LOW (ref 12.0–15.0)
Immature Granulocytes: 0 %
Lymphocytes Relative: 15 %
Lymphs Abs: 1 K/uL (ref 0.7–4.0)
MCH: 30.3 pg (ref 26.0–34.0)
MCHC: 31 g/dL (ref 30.0–36.0)
MCV: 97.7 fL (ref 80.0–100.0)
Monocytes Absolute: 0.7 K/uL (ref 0.1–1.0)
Monocytes Relative: 12 %
Neutro Abs: 4.2 K/uL (ref 1.7–7.7)
Neutrophils Relative %: 67 %
Platelet Count: 143 K/uL — ABNORMAL LOW (ref 150–400)
RBC: 3.47 MIL/uL — ABNORMAL LOW (ref 3.87–5.11)
RDW: 15.7 % — ABNORMAL HIGH (ref 11.5–15.5)
WBC Count: 6.2 K/uL (ref 4.0–10.5)
nRBC: 0 % (ref 0.0–0.2)

## 2023-08-03 LAB — RETICULOCYTES
Immature Retic Fract: 10.2 % (ref 2.3–15.9)
RBC.: 3.46 MIL/uL — ABNORMAL LOW (ref 3.87–5.11)
Retic Count, Absolute: 29.4 K/uL (ref 19.0–186.0)
Retic Ct Pct: 0.9 % (ref 0.4–3.1)

## 2023-08-03 LAB — CMP (CANCER CENTER ONLY)
ALT: 6 U/L (ref 0–44)
AST: 10 U/L — ABNORMAL LOW (ref 15–41)
Albumin: 4 g/dL (ref 3.5–5.0)
Alkaline Phosphatase: 44 U/L (ref 38–126)
Anion gap: 7 (ref 5–15)
BUN: 29 mg/dL — ABNORMAL HIGH (ref 8–23)
CO2: 33 mmol/L — ABNORMAL HIGH (ref 22–32)
Calcium: 9.7 mg/dL (ref 8.9–10.3)
Chloride: 100 mmol/L (ref 98–111)
Creatinine: 1.7 mg/dL — ABNORMAL HIGH (ref 0.44–1.00)
GFR, Estimated: 30 mL/min — ABNORMAL LOW (ref >60)
Glucose, Bld: 91 mg/dL (ref 70–99)
Potassium: 4.2 mmol/L (ref 3.5–5.1)
Sodium: 140 mmol/L (ref 135–145)
Total Bilirubin: 0.6 mg/dL (ref 0.0–1.2)
Total Protein: 6.5 g/dL (ref 6.5–8.1)

## 2023-08-03 LAB — IRON AND IRON BINDING CAPACITY (CC-WL,HP ONLY)
Iron: 77 ug/dL (ref 28–170)
Saturation Ratios: 35 % — ABNORMAL HIGH (ref 10.4–31.8)
TIBC: 223 ug/dL — ABNORMAL LOW (ref 250–450)
UIBC: 146 ug/dL — ABNORMAL LOW (ref 148–442)

## 2023-08-03 LAB — FERRITIN: Ferritin: 665 ng/mL — ABNORMAL HIGH (ref 11–307)

## 2023-08-03 MED ORDER — EPOETIN ALFA-EPBX 40000 UNIT/ML IJ SOLN
40000.0000 [IU] | Freq: Once | INTRAMUSCULAR | Status: AC
  Administered 2023-08-03: 40000 [IU] via SUBCUTANEOUS
  Filled 2023-08-03: qty 1

## 2023-08-03 NOTE — Patient Instructions (Signed)

## 2023-08-03 NOTE — Progress Notes (Signed)
 Hematology and Oncology Follow Up Visit  [name] [id number] 1940-02-03 83 y.o. 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 She is doing well but has noted fatigue.  She notes that her SOB his been a little increased with the heat.  No fever, chills, n/v, cough, rash, dizziness, chest pain, palpitations, abdominal pain or changes in bowel or bladder habits.  No numbness or tingling in her extremities.  Edema in both lower extremities is stable. Pedal pulses are 1+.  No falls or syncope reported.  Appetite and hydration are good. Weight is stable at 168 lbs.   ECOG Performance Status: 1 - Symptomatic but completely ambulatory  Medications:  Allergies as of 08/03/2023       Reactions   Montelukast  Sodium Palpitations, Other (See Comments)   REACTION: HEART PALPITATIONS, CHEST PAIN.   Sulfa Antibiotics Other (See Comments)   CHEST PAIN   Sulfonamide Derivatives Other (See Comments)   CHEST PAIN   Ace Inhibitors Other (See Comments)   PT. STATED UNKNOWN REACTION   Indomethacin Other (See Comments)   PT. STATED UNKNOWN REACTION        Medication List        Accurate as of August 03, 2023  1:06 PM. If you have any questions, ask your nurse or doctor.          Accu-Chek Aviva Soln USE AS DIRECTED WITH GLUCOMETER   Accu-Chek Guide test strip Generic drug: glucose blood TEST BLOOD SUGAR EVERY DAY AND AS NEEDED   Accu-Chek Guide w/Device Kit USE AS DIRECTED TO CHECK BLOOD SUGAR   Accu-Chek Softclix Lancets lancets TEST BLOOD SUGAR EVERY DAY AND AS NEEDED   albuterol  108 (90 Base) MCG/ACT inhaler Commonly known as: VENTOLIN  HFA INHALE 2 PUFFS EVERY 4 (FOUR) HOURS AS NEEDED FOR WHEEZING OR SHORTNESS OF BREATH.   allopurinol  100 MG tablet Commonly known  as: ZYLOPRIM  TAKE 2 TABLETS ONE TIME DAILY   b complex vitamins tablet Take 1 tablet by mouth daily.   cyanocobalamin  1000 MCG tablet Commonly known as: VITAMIN B12 Take 1,000 mcg by mouth daily.   docusate sodium  100 MG capsule Commonly known as: COLACE Take 1 capsule (100 mg total) by mouth 2 (two) times daily.   DropSafe Alcohol  Prep 70 % Pads USE AS DIRECTED  BEFORE  CHECKING BLOOD SUGAR   fluticasone  50 MCG/ACT nasal spray Commonly known as: FLONASE  USE 2 SPRAYS IN BOTH NOSTRILS DAILY   furosemide  40 MG tablet Commonly known as: LASIX  TAKE 1 TABLET DAILY , TAKE SECOND TABLET DAILY AS NEEDED FOR INCREASED EDEMA OR WEIGHT GAIN OVER 3 LBS/24 HRS   ipratropium-albuterol  0.5-2.5 (3) MG/3ML Soln Commonly known as: DUONEB Take 3 mLs by nebulization every 4 (four) hours as needed.   loratadine  10 MG tablet Commonly known as: CLARITIN  Take 1 tablet (10 mg total) by mouth at bedtime. What changed:  when to take this reasons to take this   metoprolol  succinate 25 MG 24 hr tablet Commonly known as: TOPROL -XL Take 12.5 mg by mouth daily.   OXYGEN  Inhale 2 L into the lungs continuous.   pantoprazole  40 MG tablet Commonly known as: PROTONIX  TAKE 1 TABLET EVERY DAY  polyethylene glycol 17 g packet Commonly known as: MIRALAX  / GLYCOLAX  Take 17 g by mouth daily.   pravastatin  40 MG tablet Commonly known as: PRAVACHOL  TAKE 1 TABLET EVERY DAY   PROBIOTIC PO Take 1 tablet by mouth daily.   Prolia  60 MG/ML Sosy injection Generic drug: denosumab  To be given in office.  Pt has appt 08/15/22.  Dx code: M81.0   sertraline  50 MG tablet Commonly known as: ZOLOFT  Take 1 tablet (50 mg total) by mouth daily.   Symbicort  160-4.5 MCG/ACT inhaler Generic drug: budesonide -formoterol  INHALE 2 PUFFS TWICE DAILY IN THE MORNING AND AT BEDTIME   traMADol  50 MG tablet Commonly known as: ULTRAM  TAKE 1 TABLET BY MOUTH EVERY 6 HOURS AS NEEDED   trolamine salicylate 10 %  cream Commonly known as: ASPERCREME Apply 1 application topically as needed for muscle pain.        Allergies:  Allergies  Allergen Reactions   Montelukast  Sodium Palpitations and Other (See Comments)    REACTION: HEART PALPITATIONS, CHEST PAIN.   Sulfa Antibiotics Other (See Comments)    CHEST PAIN   Sulfonamide Derivatives Other (See Comments)    CHEST PAIN   Ace Inhibitors Other (See Comments)    PT. STATED UNKNOWN REACTION   Indomethacin Other (See Comments)    PT. STATED UNKNOWN REACTION    Past Medical History, Surgical history, Social history, and Family History were reviewed and updated.  Review of Systems: All other 10 point review of systems is negative.   Physical Exam:  vitals were not taken for this visit.   Wt Readings from Last 3 Encounters:  06/30/23 168 lb (76.2 kg)  06/24/23 169 lb 6.4 oz (76.8 kg)  05/19/23 175 lb (79.4 kg)    Ocular: Sclerae unicteric, pupils equal, round and reactive to light Ear-nose-throat: Oropharynx clear, dentition fair Lymphatic: No cervical or supraclavicular adenopathy Lungs no rales or rhonchi, good excursion bilaterally Heart regular rate and rhythm, no murmur appreciated Abd soft, nontender, positive bowel sounds MSK no focal spinal tenderness, no joint edema Neuro: non-focal, well-oriented, appropriate affect Breasts: Deferred   Lab Results  Component Value Date   WBC 7.4 07/15/2023   HGB 10.1 (L) 07/15/2023   HCT 32.6 (L) 07/15/2023   MCV 98.5 07/15/2023   PLT 148 (L) 07/15/2023   Lab Results  Component Value Date   FERRITIN 705 (H) 06/24/2023   IRON  66 06/24/2023   TIBC 246 (L) 06/24/2023   UIBC 180 06/24/2023   IRONPCTSAT 27 06/24/2023   Lab Results  Component Value Date   RETICCTPCT 0.8 06/24/2023   RBC 3.31 (L) 07/15/2023   No results found for: KPAFRELGTCHN, LAMBDASER, KAPLAMBRATIO No results found for: IGGSERUM, IGA, IGMSERUM No results found for: TOTALPROTELP, ALBUMINELP,  A1GS, A2GS, BETS, BETA2SER, GAMS, MSPIKE, SPEI   Chemistry      Component Value Date/Time   NA 141 06/24/2023 1250   NA 142 09/24/2022 1118   K 4.3 06/24/2023 1250   CL 99 06/24/2023 1250   CO2 34 (H) 06/24/2023 1250   BUN 37 (H) 06/24/2023 1250   BUN 52 (H) 09/24/2022 1118   CREATININE 1.66 (H) 06/24/2023 1250   CREATININE 1.36 (H) 08/23/2013 1134      Component Value Date/Time   CALCIUM 9.6 06/24/2023 1250   ALKPHOS 48 06/24/2023 1250   AST 11 (L) 06/24/2023 1250   ALT 7 06/24/2023 1250   BILITOT 0.8 06/24/2023 1250       Impression and Plan: [name] is a  very pleasant 83 yo caucasian female with multifactorial anemia.  ESA given for Hgb 10.5.  Iron  studies pending. We will replace if needed.  Lab and injection every 3 weeks, follow-up in 6 weeks.   Lauraine Pepper, NP 7/14/20251:06 PM

## 2023-08-03 NOTE — Telephone Encounter (Signed)
 Requesting: Tramadol  50 mg Tablet  Contract: None  UDS: None  Last Visit: 06/30/2023  Next Visit: 11/03/2023 Last Refill: 06/29/2023 #90 with no refills  Please Advise

## 2023-08-10 ENCOUNTER — Telehealth: Payer: Self-pay

## 2023-08-10 NOTE — Telephone Encounter (Signed)
 Prolia  VOB initiated via [email or web address]  Next Prolia  inj DUE: 08/24/23

## 2023-08-11 ENCOUNTER — Other Ambulatory Visit (HOSPITAL_COMMUNITY): Payer: Self-pay

## 2023-08-11 NOTE — Telephone Encounter (Signed)
 [name]

## 2023-08-11 NOTE — Telephone Encounter (Signed)
 Pt ready for scheduling for PROLIA  on or after : 08/24/23  Option# 1: Buy/Bill (Office supplied medication)  Out-of-pocket cost due at time of clinic visit: $347  Number of injection/visits approved: 2  Primary: [employer] Prolia  co-insurance: 20% Admin fee co-insurance: $15  Secondary: --- Prolia  co-insurance:  Admin fee co-insurance:   Medical Benefit Details: Date Benefits were checked: 08/11/23 Deductible: NO/ Coinsurance: 20%/ Admin Fee: $15  Prior Auth: APPROVED PA# [id number] Expiration Date: 06/05/21-01/20/24  # of doses approved: 2 ----------------------------------------------------------------------- Option# 2- Med Obtained from pharmacy:  Pharmacy benefit: Copay $930.81 (Paid to pharmacy) Admin Fee: $15 (Pay at clinic)  Prior Auth: N/A PA# Expiration Date:   # of doses approved:   If patient wants fill through the pharmacy benefit please send prescription to: [facility], and include estimated need by date in rx notes. Pharmacy will ship medication directly to the office.  Patient NOT eligible for Prolia  Copay Card. Copay Card can make patient's cost as little as $25. Link to apply: [email or web address]://[email or web address]  ** This summary of benefits is an estimation of the patient's out-of-pocket cost. Exact cost may very based on individual plan coverage.

## 2023-08-24 ENCOUNTER — Inpatient Hospital Stay: Attending: Family Medicine

## 2023-08-24 ENCOUNTER — Inpatient Hospital Stay

## 2023-08-24 VITALS — BP 135/52 | HR 89 | Temp 98.1°F | Resp 20

## 2023-08-24 DIAGNOSIS — D631 Anemia in chronic kidney disease: Secondary | ICD-10-CM

## 2023-08-24 DIAGNOSIS — Z79899 Other long term (current) drug therapy: Secondary | ICD-10-CM | POA: Diagnosis not present

## 2023-08-24 DIAGNOSIS — D509 Iron deficiency anemia, unspecified: Secondary | ICD-10-CM | POA: Insufficient documentation

## 2023-08-24 DIAGNOSIS — N184 Chronic kidney disease, stage 4 (severe): Secondary | ICD-10-CM | POA: Insufficient documentation

## 2023-08-24 LAB — CBC WITH DIFFERENTIAL (CANCER CENTER ONLY)
Abs Immature Granulocytes: 0.01 K/uL (ref 0.00–0.07)
Basophils Absolute: 0 K/uL (ref 0.0–0.1)
Basophils Relative: 1 %
Eosinophils Absolute: 0.3 K/uL (ref 0.0–0.5)
Eosinophils Relative: 6 %
HCT: 33.6 % — ABNORMAL LOW (ref 36.0–46.0)
Hemoglobin: 10.1 g/dL — ABNORMAL LOW (ref 12.0–15.0)
Immature Granulocytes: 0 %
Lymphocytes Relative: 22 %
Lymphs Abs: 1.2 K/uL (ref 0.7–4.0)
MCH: 29.2 pg (ref 26.0–34.0)
MCHC: 30.1 g/dL (ref 30.0–36.0)
MCV: 97.1 fL (ref 80.0–100.0)
Monocytes Absolute: 0.7 K/uL (ref 0.1–1.0)
Monocytes Relative: 12 %
Neutro Abs: 3.3 K/uL (ref 1.7–7.7)
Neutrophils Relative %: 59 %
Platelet Count: 127 K/uL — ABNORMAL LOW (ref 150–400)
RBC: 3.46 MIL/uL — ABNORMAL LOW (ref 3.87–5.11)
RDW: 16 % — ABNORMAL HIGH (ref 11.5–15.5)
WBC Count: 5.6 K/uL (ref 4.0–10.5)
nRBC: 0 % (ref 0.0–0.2)

## 2023-08-24 LAB — CMP (CANCER CENTER ONLY)
ALT: 8 U/L (ref 0–44)
AST: 16 U/L (ref 15–41)
Albumin: 4 g/dL (ref 3.5–5.0)
Alkaline Phosphatase: 47 U/L (ref 38–126)
Anion gap: 11 (ref 5–15)
BUN: 43 mg/dL — ABNORMAL HIGH (ref 8–23)
CO2: 30 mmol/L (ref 22–32)
Calcium: 9.6 mg/dL (ref 8.9–10.3)
Chloride: 100 mmol/L (ref 98–111)
Creatinine: 1.85 mg/dL — ABNORMAL HIGH (ref 0.44–1.00)
GFR, Estimated: 27 mL/min — ABNORMAL LOW (ref >60)
Glucose, Bld: 116 mg/dL — ABNORMAL HIGH (ref 70–99)
Potassium: 5.1 mmol/L (ref 3.5–5.1)
Sodium: 141 mmol/L (ref 135–145)
Total Bilirubin: 0.4 mg/dL (ref 0.0–1.2)
Total Protein: 6.4 g/dL — ABNORMAL LOW (ref 6.5–8.1)

## 2023-08-24 MED ORDER — EPOETIN ALFA-EPBX 40000 UNIT/ML IJ SOLN
40000.0000 [IU] | Freq: Once | INTRAMUSCULAR | Status: AC
  Administered 2023-08-24: 40000 [IU] via SUBCUTANEOUS
  Filled 2023-08-24: qty 1

## 2023-08-24 NOTE — Patient Instructions (Signed)

## 2023-08-25 ENCOUNTER — Ambulatory Visit

## 2023-08-26 ENCOUNTER — Ambulatory Visit

## 2023-08-26 ENCOUNTER — Other Ambulatory Visit: Payer: Self-pay | Admitting: Family Medicine

## 2023-08-31 ENCOUNTER — Ambulatory Visit (INDEPENDENT_AMBULATORY_CARE_PROVIDER_SITE_OTHER): Admitting: *Deleted

## 2023-08-31 DIAGNOSIS — M8589 Other specified disorders of bone density and structure, multiple sites: Secondary | ICD-10-CM | POA: Diagnosis not present

## 2023-08-31 NOTE — Progress Notes (Signed)
 Patient here for Prolia  injection per physicians orders  Prolia  60 mg SQ , was administered right arm today. Patient tolerated injection.  Patient next injection due: 6 months, appt made:  No- will schedule in 5 months after benefits are ran again  Initial injection: no  Did Prolia  come from pharmacy (if yes please select patient supplied): no  Cam placed for next injection: yes

## 2023-09-01 MED ORDER — DENOSUMAB 60 MG/ML ~~LOC~~ SOSY: 60.0000 mg | PREFILLED_SYRINGE | SUBCUTANEOUS | Status: AC

## 2023-09-01 MED ORDER — DENOSUMAB 60 MG/ML ~~LOC~~ SOSY
60.0000 mg | PREFILLED_SYRINGE | SUBCUTANEOUS | Status: AC
  Administered 2023-08-31 (×2): 60 mg via SUBCUTANEOUS

## 2023-09-01 NOTE — Addendum Note (Signed)
 Addended by: ESTELLE GILLIS D on: 09/01/2023 08:36 AM   Modules accepted: Orders

## 2023-09-03 DIAGNOSIS — H1031 Unspecified acute conjunctivitis, right eye: Secondary | ICD-10-CM | POA: Diagnosis not present

## 2023-09-06 DIAGNOSIS — H10011 Acute follicular conjunctivitis, right eye: Secondary | ICD-10-CM | POA: Diagnosis not present

## 2023-09-11 DIAGNOSIS — H10011 Acute follicular conjunctivitis, right eye: Secondary | ICD-10-CM | POA: Diagnosis not present

## 2023-09-12 ENCOUNTER — Other Ambulatory Visit: Payer: Self-pay | Admitting: Family Medicine

## 2023-09-12 DIAGNOSIS — M549 Dorsalgia, unspecified: Secondary | ICD-10-CM

## 2023-09-14 ENCOUNTER — Inpatient Hospital Stay (HOSPITAL_BASED_OUTPATIENT_CLINIC_OR_DEPARTMENT_OTHER): Admitting: Family

## 2023-09-14 ENCOUNTER — Inpatient Hospital Stay

## 2023-09-14 VITALS — BP 102/74 | HR 69 | Temp 98.2°F | Resp 17 | Ht 62.0 in | Wt 168.8 lb

## 2023-09-14 DIAGNOSIS — Z79899 Other long term (current) drug therapy: Secondary | ICD-10-CM | POA: Diagnosis not present

## 2023-09-14 DIAGNOSIS — D631 Anemia in chronic kidney disease: Secondary | ICD-10-CM

## 2023-09-14 DIAGNOSIS — D509 Iron deficiency anemia, unspecified: Secondary | ICD-10-CM

## 2023-09-14 DIAGNOSIS — N184 Chronic kidney disease, stage 4 (severe): Secondary | ICD-10-CM

## 2023-09-14 DIAGNOSIS — N183 Chronic kidney disease, stage 3 unspecified: Secondary | ICD-10-CM

## 2023-09-14 LAB — IRON AND IRON BINDING CAPACITY (CC-WL,HP ONLY)
Iron: 148 ug/dL (ref 28–170)
Saturation Ratios: 55 % — ABNORMAL HIGH (ref 10.4–31.8)
TIBC: 270 ug/dL (ref 250–450)
UIBC: 122 ug/dL

## 2023-09-14 LAB — CBC WITH DIFFERENTIAL (CANCER CENTER ONLY)
Abs Immature Granulocytes: 0.02 K/uL (ref 0.00–0.07)
Basophils Absolute: 0 K/uL (ref 0.0–0.1)
Basophils Relative: 1 %
Eosinophils Absolute: 0.3 K/uL (ref 0.0–0.5)
Eosinophils Relative: 5 %
HCT: 34.7 % — ABNORMAL LOW (ref 36.0–46.0)
Hemoglobin: 10.7 g/dL — ABNORMAL LOW (ref 12.0–15.0)
Immature Granulocytes: 0 %
Lymphocytes Relative: 17 %
Lymphs Abs: 1 K/uL (ref 0.7–4.0)
MCH: 30.2 pg (ref 26.0–34.0)
MCHC: 30.8 g/dL (ref 30.0–36.0)
MCV: 98 fL (ref 80.0–100.0)
Monocytes Absolute: 0.7 K/uL (ref 0.1–1.0)
Monocytes Relative: 12 %
Neutro Abs: 3.9 K/uL (ref 1.7–7.7)
Neutrophils Relative %: 65 %
Platelet Count: 126 K/uL — ABNORMAL LOW (ref 150–400)
RBC: 3.54 MIL/uL — ABNORMAL LOW (ref 3.87–5.11)
RDW: 16.4 % — ABNORMAL HIGH (ref 11.5–15.5)
WBC Count: 5.9 K/uL (ref 4.0–10.5)
nRBC: 0 % (ref 0.0–0.2)

## 2023-09-14 LAB — CMP (CANCER CENTER ONLY)
ALT: 11 U/L (ref 0–44)
AST: 20 U/L (ref 15–41)
Albumin: 4.4 g/dL (ref 3.5–5.0)
Alkaline Phosphatase: 51 U/L (ref 38–126)
Anion gap: 11 (ref 5–15)
BUN: 34 mg/dL — ABNORMAL HIGH (ref 8–23)
CO2: 30 mmol/L (ref 22–32)
Calcium: 9.7 mg/dL (ref 8.9–10.3)
Chloride: 100 mmol/L (ref 98–111)
Creatinine: 1.81 mg/dL — ABNORMAL HIGH (ref 0.44–1.00)
GFR, Estimated: 27 mL/min — ABNORMAL LOW (ref >60)
Glucose, Bld: 93 mg/dL (ref 70–99)
Potassium: 5.3 mmol/L — ABNORMAL HIGH (ref 3.5–5.1)
Sodium: 142 mmol/L (ref 135–145)
Total Bilirubin: 0.6 mg/dL (ref 0.0–1.2)
Total Protein: 6.9 g/dL (ref 6.5–8.1)

## 2023-09-14 LAB — FERRITIN: Ferritin: 700 ng/mL — ABNORMAL HIGH (ref 11–307)

## 2023-09-14 LAB — RETICULOCYTES
Immature Retic Fract: 10.7 % (ref 2.3–15.9)
RBC.: 3.51 MIL/uL — ABNORMAL LOW (ref 3.87–5.11)
Retic Count, Absolute: 26.3 K/uL (ref 19.0–186.0)
Retic Ct Pct: 0.8 % (ref 0.4–3.1)

## 2023-09-14 MED ORDER — EPOETIN ALFA-EPBX 40000 UNIT/ML IJ SOLN
40000.0000 [IU] | Freq: Once | INTRAMUSCULAR | Status: AC
  Administered 2023-09-14: 40000 [IU] via SUBCUTANEOUS
  Filled 2023-09-14: qty 1

## 2023-09-14 NOTE — Telephone Encounter (Signed)
 Requesting: Tramadol  50 mg Tablet  Contract: None  UDS: None  Last Visit: 06/30/2023  Next Visit: 11/03/2023 Last Refill: 08/05/2023 #90 with no refills  Please Advise

## 2023-09-14 NOTE — Patient Instructions (Signed)

## 2023-09-14 NOTE — Progress Notes (Signed)
 Hematology and Oncology Follow Up Visit  [name] [id number] [birth year] 83 y.o. 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 She is doing well and has no new complaints at this time.  SOB is stable. She is on 1L [place] when resting and 2 L supplemental O2 when up and about. She states that her SOB remains stable and will rest as needed.  No fever, chills, n/v, cough, rash, dizziness, chest pain, palpitations, abdominal pain or changes in bowel or bladder habits.  No blood loss noted. No bruising or petechiae.  Some swelling in the lower extremities that waxes and wanes unchanged from baseline.  No falls or syncope reported. She uses a Rolator when ambulating for added support.  Appetite and hydration are good. Weight is stable at 168 lbs.   ECOG Performance Status: 1 - Symptomatic but completely ambulatory  Medications:  Allergies as of 09/14/2023       Reactions   Montelukast  Sodium Palpitations, Other (See Comments)   REACTION: HEART PALPITATIONS, CHEST PAIN.   Sulfa Antibiotics Other (See Comments)   CHEST PAIN   Sulfonamide Derivatives Other (See Comments)   CHEST PAIN   Ace Inhibitors Other (See Comments)   PT. STATED UNKNOWN REACTION   Indomethacin Other (See Comments)   PT. STATED UNKNOWN REACTION        Medication List        Accurate as of September 14, 2023  2:17 PM. If you have any questions, ask your nurse or doctor.          Accu-Chek Aviva Soln USE AS DIRECTED WITH GLUCOMETER   Accu-Chek Guide Test test strip Generic drug: glucose blood TEST BLOOD SUGAR EVERY DAY AND AS NEEDED   Accu-Chek Guide w/Device Kit USE AS DIRECTED TO CHECK BLOOD SUGAR   Accu-Chek Softclix Lancets lancets TEST BLOOD SUGAR EVERY DAY AND AS NEEDED   albuterol  108 (90 Base)  MCG/ACT inhaler Commonly known as: VENTOLIN  HFA INHALE 2 PUFFS EVERY 4 (FOUR) HOURS AS NEEDED FOR WHEEZING OR SHORTNESS OF BREATH.   allopurinol  100 MG tablet Commonly known as: ZYLOPRIM  TAKE 2 TABLETS ONE TIME DAILY   b complex vitamins tablet Take 1 tablet by mouth daily.   cyanocobalamin  1000 MCG tablet Commonly known as: VITAMIN B12 Take 1,000 mcg by mouth daily.   docusate sodium  100 MG capsule Commonly known as: COLACE Take 1 capsule (100 mg total) by mouth 2 (two) times daily.   DropSafe Alcohol  Prep 70 % Pads USE AS DIRECTED  BEFORE  CHECKING BLOOD SUGAR   fluticasone  50 MCG/ACT nasal spray Commonly known as: FLONASE  USE 2 SPRAYS IN BOTH NOSTRILS DAILY   furosemide  40 MG tablet Commonly known as: LASIX  TAKE 1 TABLET DAILY , TAKE SECOND TABLET DAILY AS NEEDED FOR INCREASED EDEMA OR WEIGHT GAIN OVER 3 LBS/24 HRS   ipratropium-albuterol  0.5-2.5 (3) MG/3ML Soln Commonly known as: DUONEB Take 3 mLs by nebulization every 4 (four) hours as needed.   loratadine  10 MG tablet Commonly known as: CLARITIN  Take 1 tablet (10 mg total) by mouth at bedtime. What changed:  when to take this reasons to take this   metoprolol  succinate 25 MG 24 hr tablet Commonly known as: TOPROL -XL Take 12.5 mg by mouth daily.   OXYGEN   Inhale 2 L into the lungs continuous.   pantoprazole  40 MG tablet Commonly known as: PROTONIX  TAKE 1 TABLET EVERY DAY   polyethylene glycol 17 g packet Commonly known as: MIRALAX  / GLYCOLAX  Take 17 g by mouth daily.   pravastatin  40 MG tablet Commonly known as: PRAVACHOL  TAKE 1 TABLET EVERY DAY   PROBIOTIC PO Take 1 tablet by mouth daily.   Prolia  60 MG/ML Sosy injection Generic drug: denosumab  To be given in office.  Pt has appt 08/15/22.  Dx code: M81.0   sertraline  50 MG tablet Commonly known as: ZOLOFT  Take 1 tablet (50 mg total) by mouth daily.   Symbicort  160-4.5 MCG/ACT inhaler Generic drug: budesonide -formoterol  INHALE 2 PUFFS TWICE  DAILY IN THE MORNING AND AT BEDTIME   traMADol  50 MG tablet Commonly known as: ULTRAM  TAKE 1 TABLET BY MOUTH EVERY 6 HOURS AS NEEDED   trolamine salicylate 10 % cream Commonly known as: ASPERCREME Apply 1 application topically as needed for muscle pain.        Allergies:  Allergies  Allergen Reactions   Montelukast  Sodium Palpitations and Other (See Comments)    REACTION: HEART PALPITATIONS, CHEST PAIN.   Sulfa Antibiotics Other (See Comments)    CHEST PAIN   Sulfonamide Derivatives Other (See Comments)    CHEST PAIN   Ace Inhibitors Other (See Comments)    PT. STATED UNKNOWN REACTION   Indomethacin Other (See Comments)    PT. STATED UNKNOWN REACTION    Past Medical History, Surgical history, Social history, and Family History were reviewed and updated.  Review of Systems: All other 10 point review of systems is negative.   Physical Exam:  height is 5' 2 (1.575 m) and weight is 168 lb 12.8 oz (76.6 kg). Her oral temperature is 98.2 F (36.8 C). Her blood pressure is 102/74 and her pulse is 69. Her respiration is 17 and oxygen  saturation is 99%.   Wt Readings from Last 3 Encounters:  09/14/23 168 lb 12.8 oz (76.6 kg)  08/03/23 168 lb 1.9 oz (76.3 kg)  06/30/23 168 lb (76.2 kg)    Ocular: Sclerae unicteric, pupils equal, round and reactive to light Ear-nose-throat: Oropharynx clear, dentition fair Lymphatic: No cervical or supraclavicular adenopathy Lungs no rales or rhonchi, good excursion bilaterally Heart regular rate and rhythm, no murmur appreciated Abd soft, nontender, positive bowel sounds MSK no focal spinal tenderness, no joint edema Neuro: non-focal, well-oriented, appropriate affect Breasts: Deferred   Lab Results  Component Value Date   WBC 5.6 08/24/2023   HGB 10.1 (L) 08/24/2023   HCT 33.6 (L) 08/24/2023   MCV 97.1 08/24/2023   PLT 127 (L) 08/24/2023   Lab Results  Component Value Date   FERRITIN 665 (H) 08/03/2023   IRON  77 08/03/2023    TIBC 223 (L) 08/03/2023   UIBC 146 (L) 08/03/2023   IRONPCTSAT 35 (H) 08/03/2023   Lab Results  Component Value Date   RETICCTPCT 0.8 09/14/2023   RBC 3.51 (L) 09/14/2023   No results found for: KPAFRELGTCHN, LAMBDASER, KAPLAMBRATIO No results found for: IGGSERUM, IGA, IGMSERUM No results found for: STEPHANY CARLOTA BENSON MARKEL EARLA JOANNIE DOC VICK, SPEI   Chemistry      Component Value Date/Time   NA 141 08/24/2023 1315   NA 142 09/24/2022 1118   K 5.1 08/24/2023 1315   CL 100 08/24/2023 1315   CO2 30 08/24/2023 1315   BUN 43 (H) 08/24/2023 1315   BUN 52 (H) 09/24/2022 1118   CREATININE 1.85 (  H) 08/24/2023 1315   CREATININE 1.36 (H) 08/23/2013 1134      Component Value Date/Time   CALCIUM 9.6 08/24/2023 1315   ALKPHOS 47 08/24/2023 1315   AST 16 08/24/2023 1315   ALT 8 08/24/2023 1315   BILITOT 0.4 08/24/2023 1315       Impression and Plan: [name] is a very pleasant 83 yo caucasian female with multifactorial anemia.  ESA given for Hgb 10.7.  Iron  studies pending. We will replace if needed.  Lab and injection every 3 weeks, follow-up in 9 weeks.   Lauraine Pepper, NP 8/25/20252:17 PM

## 2023-09-15 DIAGNOSIS — Z961 Presence of intraocular lens: Secondary | ICD-10-CM | POA: Diagnosis not present

## 2023-09-16 ENCOUNTER — Encounter (HOSPITAL_COMMUNITY): Payer: Self-pay | Admitting: Cardiology

## 2023-10-01 DIAGNOSIS — D631 Anemia in chronic kidney disease: Secondary | ICD-10-CM | POA: Diagnosis not present

## 2023-10-01 DIAGNOSIS — N184 Chronic kidney disease, stage 4 (severe): Secondary | ICD-10-CM | POA: Diagnosis not present

## 2023-10-01 DIAGNOSIS — E1122 Type 2 diabetes mellitus with diabetic chronic kidney disease: Secondary | ICD-10-CM | POA: Diagnosis not present

## 2023-10-01 DIAGNOSIS — I5030 Unspecified diastolic (congestive) heart failure: Secondary | ICD-10-CM | POA: Diagnosis not present

## 2023-10-01 DIAGNOSIS — I129 Hypertensive chronic kidney disease with stage 1 through stage 4 chronic kidney disease, or unspecified chronic kidney disease: Secondary | ICD-10-CM | POA: Diagnosis not present

## 2023-10-02 LAB — LAB REPORT - SCANNED
Albumin, Urine POC: <3
Albumin/Creatinine Ratio, Urine, POC: <3
Creatinine, POC: 103.1 mg/dL

## 2023-10-05 ENCOUNTER — Inpatient Hospital Stay

## 2023-10-07 ENCOUNTER — Inpatient Hospital Stay

## 2023-10-07 ENCOUNTER — Other Ambulatory Visit: Payer: Self-pay | Admitting: Family Medicine

## 2023-10-07 ENCOUNTER — Inpatient Hospital Stay: Attending: Family Medicine

## 2023-10-07 VITALS — BP 109/53 | HR 82 | Temp 98.2°F | Resp 20

## 2023-10-07 DIAGNOSIS — Z79899 Other long term (current) drug therapy: Secondary | ICD-10-CM | POA: Diagnosis not present

## 2023-10-07 DIAGNOSIS — D631 Anemia in chronic kidney disease: Secondary | ICD-10-CM

## 2023-10-07 DIAGNOSIS — N184 Chronic kidney disease, stage 4 (severe): Secondary | ICD-10-CM | POA: Insufficient documentation

## 2023-10-07 DIAGNOSIS — D509 Iron deficiency anemia, unspecified: Secondary | ICD-10-CM

## 2023-10-07 LAB — CBC WITH DIFFERENTIAL (CANCER CENTER ONLY)
Abs Immature Granulocytes: 0.01 K/uL (ref 0.00–0.07)
Basophils Absolute: 0 K/uL (ref 0.0–0.1)
Basophils Relative: 0 %
Eosinophils Absolute: 0.3 K/uL (ref 0.0–0.5)
Eosinophils Relative: 5 %
HCT: 34 % — ABNORMAL LOW (ref 36.0–46.0)
Hemoglobin: 10.5 g/dL — ABNORMAL LOW (ref 12.0–15.0)
Immature Granulocytes: 0 %
Lymphocytes Relative: 17 %
Lymphs Abs: 1 K/uL (ref 0.7–4.0)
MCH: 30.4 pg (ref 26.0–34.0)
MCHC: 30.9 g/dL (ref 30.0–36.0)
MCV: 98.6 fL (ref 80.0–100.0)
Monocytes Absolute: 0.6 K/uL (ref 0.1–1.0)
Monocytes Relative: 9 %
Neutro Abs: 4.2 K/uL (ref 1.7–7.7)
Neutrophils Relative %: 69 %
Platelet Count: 112 K/uL — ABNORMAL LOW (ref 150–400)
RBC: 3.45 MIL/uL — ABNORMAL LOW (ref 3.87–5.11)
RDW: 16 % — ABNORMAL HIGH (ref 11.5–15.5)
WBC Count: 6.2 K/uL (ref 4.0–10.5)
nRBC: 0 % (ref 0.0–0.2)

## 2023-10-07 LAB — CMP (CANCER CENTER ONLY)
ALT: 9 U/L (ref 0–44)
AST: 17 U/L (ref 15–41)
Albumin: 4.3 g/dL (ref 3.5–5.0)
Alkaline Phosphatase: 41 U/L (ref 38–126)
Anion gap: 9 (ref 5–15)
BUN: 38 mg/dL — ABNORMAL HIGH (ref 8–23)
CO2: 30 mmol/L (ref 22–32)
Calcium: 9.9 mg/dL (ref 8.9–10.3)
Chloride: 102 mmol/L (ref 98–111)
Creatinine: 1.7 mg/dL — ABNORMAL HIGH (ref 0.44–1.00)
GFR, Estimated: 30 mL/min — ABNORMAL LOW (ref >60)
Glucose, Bld: 87 mg/dL (ref 70–99)
Potassium: 5.1 mmol/L (ref 3.5–5.1)
Sodium: 142 mmol/L (ref 135–145)
Total Bilirubin: 0.7 mg/dL (ref 0.0–1.2)
Total Protein: 6.7 g/dL (ref 6.5–8.1)

## 2023-10-07 MED ORDER — EPOETIN ALFA-EPBX 40000 UNIT/ML IJ SOLN
40000.0000 [IU] | Freq: Once | INTRAMUSCULAR | Status: AC
  Administered 2023-10-07: 40000 [IU] via SUBCUTANEOUS
  Filled 2023-10-07: qty 1

## 2023-10-07 NOTE — Patient Instructions (Signed)

## 2023-10-14 ENCOUNTER — Other Ambulatory Visit: Payer: Self-pay | Admitting: Family Medicine

## 2023-10-14 DIAGNOSIS — M549 Dorsalgia, unspecified: Secondary | ICD-10-CM

## 2023-10-14 NOTE — Telephone Encounter (Signed)
 Requesting: Tramadol  50 mg Tablet  Contract: None  UDS: None  Last Visit: 06/30/2023  Next Visit: 11/03/2023 Last Refill: 09/14/2023 #90 with no refills  Please Advise

## 2023-10-17 ENCOUNTER — Other Ambulatory Visit: Payer: Self-pay | Admitting: Family

## 2023-10-26 ENCOUNTER — Inpatient Hospital Stay

## 2023-10-26 ENCOUNTER — Inpatient Hospital Stay: Attending: Family | Admitting: Family

## 2023-10-26 VITALS — BP 103/38 | HR 89 | Temp 98.6°F | Resp 20 | Ht 62.0 in | Wt 166.0 lb

## 2023-10-26 DIAGNOSIS — D631 Anemia in chronic kidney disease: Secondary | ICD-10-CM | POA: Diagnosis not present

## 2023-10-26 DIAGNOSIS — N183 Chronic kidney disease, stage 3 unspecified: Secondary | ICD-10-CM | POA: Insufficient documentation

## 2023-10-26 DIAGNOSIS — Z79899 Other long term (current) drug therapy: Secondary | ICD-10-CM | POA: Insufficient documentation

## 2023-10-26 DIAGNOSIS — E611 Iron deficiency: Secondary | ICD-10-CM | POA: Insufficient documentation

## 2023-10-26 DIAGNOSIS — N184 Chronic kidney disease, stage 4 (severe): Secondary | ICD-10-CM | POA: Diagnosis not present

## 2023-10-26 DIAGNOSIS — D509 Iron deficiency anemia, unspecified: Secondary | ICD-10-CM | POA: Diagnosis not present

## 2023-10-26 LAB — CBC WITH DIFFERENTIAL (CANCER CENTER ONLY)
Abs Immature Granulocytes: 0.03 K/uL (ref 0.00–0.07)
Basophils Absolute: 0 K/uL (ref 0.0–0.1)
Basophils Relative: 1 %
Eosinophils Absolute: 0.2 K/uL (ref 0.0–0.5)
Eosinophils Relative: 4 %
HCT: 32.5 % — ABNORMAL LOW (ref 36.0–46.0)
Hemoglobin: 10 g/dL — ABNORMAL LOW (ref 12.0–15.0)
Immature Granulocytes: 1 %
Lymphocytes Relative: 21 %
Lymphs Abs: 1.3 K/uL (ref 0.7–4.0)
MCH: 30.1 pg (ref 26.0–34.0)
MCHC: 30.8 g/dL (ref 30.0–36.0)
MCV: 97.9 fL (ref 80.0–100.0)
Monocytes Absolute: 0.5 K/uL (ref 0.1–1.0)
Monocytes Relative: 9 %
Neutro Abs: 3.9 K/uL (ref 1.7–7.7)
Neutrophils Relative %: 64 %
Platelet Count: 123 K/uL — ABNORMAL LOW (ref 150–400)
RBC: 3.32 MIL/uL — ABNORMAL LOW (ref 3.87–5.11)
RDW: 16 % — ABNORMAL HIGH (ref 11.5–15.5)
WBC Count: 6 K/uL (ref 4.0–10.5)
nRBC: 0 % (ref 0.0–0.2)

## 2023-10-26 LAB — CMP (CANCER CENTER ONLY)
ALT: 7 U/L (ref 0–44)
AST: 15 U/L (ref 15–41)
Albumin: 4.1 g/dL (ref 3.5–5.0)
Alkaline Phosphatase: 40 U/L (ref 38–126)
Anion gap: 11 (ref 5–15)
BUN: 43 mg/dL — ABNORMAL HIGH (ref 8–23)
CO2: 29 mmol/L (ref 22–32)
Calcium: 8.8 mg/dL — ABNORMAL LOW (ref 8.9–10.3)
Chloride: 100 mmol/L (ref 98–111)
Creatinine: 1.92 mg/dL — ABNORMAL HIGH (ref 0.44–1.00)
GFR, Estimated: 26 mL/min — ABNORMAL LOW (ref >60)
Glucose, Bld: 140 mg/dL — ABNORMAL HIGH (ref 70–99)
Potassium: 4.2 mmol/L (ref 3.5–5.1)
Sodium: 140 mmol/L (ref 135–145)
Total Bilirubin: 0.5 mg/dL (ref 0.0–1.2)
Total Protein: 6.4 g/dL — ABNORMAL LOW (ref 6.5–8.1)

## 2023-10-26 LAB — FERRITIN: Ferritin: 609 ng/mL — ABNORMAL HIGH (ref 11–307)

## 2023-10-26 LAB — RETICULOCYTES
Immature Retic Fract: 7.5 % (ref 2.3–15.9)
RBC.: 3.33 MIL/uL — ABNORMAL LOW (ref 3.87–5.11)
Retic Count, Absolute: 27.6 K/uL (ref 19.0–186.0)
Retic Ct Pct: 0.8 % (ref 0.4–3.1)

## 2023-10-26 LAB — IRON AND IRON BINDING CAPACITY (CC-WL,HP ONLY)
Iron: 96 ug/dL (ref 28–170)
Saturation Ratios: 39 % — ABNORMAL HIGH (ref 10.4–31.8)
TIBC: 244 ug/dL — ABNORMAL LOW (ref 250–450)
UIBC: 148 ug/dL

## 2023-10-26 MED ORDER — EPOETIN ALFA-EPBX 40000 UNIT/ML IJ SOLN
40000.0000 [IU] | Freq: Once | INTRAMUSCULAR | Status: AC
  Administered 2023-10-26: 40000 [IU] via SUBCUTANEOUS

## 2023-10-26 NOTE — Progress Notes (Signed)
 Hematology and Oncology Follow Up Visit  [name] [id number] 1940-02-05 83 y.o. 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 Lanzo is here today for follow-up. She is feeling fatigued as well as having occasional dizziness but does feel that this has improved recently.  Hgb today is 10.0, MCV 97, platelets 123 and WBC count 6.0.  No obvious blood loss noted. No bruising or petechiae.  No fever, chills, n/v, cough, rash, chest pain, palpitations, abdominal pain or changes in bowel or bladder habits.  She has chronic edema in her lower extremities unchanged from baseline. No weeping or signs of infection.  No falls or syncope reported. She is ambulating with a Rolator.  Appetite and hydration are good. Weight is stable at 166 lbs.   ECOG Performance Status: 1 - Symptomatic but completely ambulatory  Medications:  Allergies as of 10/26/2023       Reactions   Montelukast  Sodium Palpitations, Other (See Comments)   REACTION: HEART PALPITATIONS, CHEST PAIN.   Sulfa Antibiotics Other (See Comments)   CHEST PAIN   Sulfonamide Derivatives Other (See Comments)   CHEST PAIN   Ace Inhibitors Other (See Comments)   PT. STATED UNKNOWN REACTION   Indomethacin Other (See Comments)   PT. STATED UNKNOWN REACTION        Medication List        Accurate as of October 26, 2023  2:43 PM. If you have any questions, ask your nurse or doctor.          Accu-Chek Aviva Soln USE AS DIRECTED WITH GLUCOMETER   Accu-Chek Guide Test test strip Generic drug: glucose blood TEST BLOOD SUGAR EVERY DAY AND AS NEEDED   Accu-Chek Guide w/Device Kit USE AS DIRECTED TO CHECK BLOOD SUGAR   Accu-Chek Softclix Lancets lancets TEST BLOOD SUGAR EVERY DAY AND AS NEEDED   albuterol  108 (90 Base) MCG/ACT inhaler Commonly known as: VENTOLIN   HFA Inhale 2 puffs into the lungs every 4 (four) hours as needed for wheezing or shortness of breath.   allopurinol  100 MG tablet Commonly known as: ZYLOPRIM  TAKE 2 TABLETS ONE TIME DAILY   b complex vitamins tablet Take 1 tablet by mouth daily.   cyanocobalamin  1000 MCG tablet Commonly known as: VITAMIN B12 Take 1,000 mcg by mouth daily.   docusate sodium  100 MG capsule Commonly known as: COLACE Take 1 capsule (100 mg total) by mouth 2 (two) times daily.   DropSafe Alcohol  Prep 70 % Pads USE AS DIRECTED  BEFORE  CHECKING BLOOD SUGAR   fluticasone  50 MCG/ACT nasal spray Commonly known as: FLONASE  USE 2 SPRAYS IN BOTH NOSTRILS DAILY   furosemide  40 MG tablet Commonly known as: LASIX  TAKE 1 TABLET DAILY , TAKE SECOND TABLET DAILY AS NEEDED FOR INCREASED EDEMA OR WEIGHT GAIN OVER 3 LBS/24 HRS   ipratropium-albuterol  0.5-2.5 (3) MG/3ML Soln Commonly known as: DUONEB Take 3 mLs by nebulization every 4 (four) hours as needed.   loratadine  10 MG tablet Commonly known as: CLARITIN  Take 1 tablet (10 mg total) by mouth at bedtime. What changed:  when to take this reasons to take this   metoprolol  succinate 25 MG 24 hr tablet Commonly known as: TOPROL -XL Take 12.5 mg by mouth daily.   OXYGEN  Inhale 2 L into the lungs continuous.  pantoprazole  40 MG tablet Commonly known as: PROTONIX  TAKE 1 TABLET EVERY DAY   polyethylene glycol 17 g packet Commonly known as: MIRALAX  / GLYCOLAX  Take 17 g by mouth daily.   pravastatin  40 MG tablet Commonly known as: PRAVACHOL  TAKE 1 TABLET EVERY DAY   PROBIOTIC PO Take 1 tablet by mouth daily.   Prolia  60 MG/ML Sosy injection Generic drug: denosumab  To be given in office.  Pt has appt 08/15/22.  Dx code: M81.0   sertraline  50 MG tablet Commonly known as: ZOLOFT  Take 1 tablet (50 mg total) by mouth daily.   Symbicort  160-4.5 MCG/ACT inhaler Generic drug: budesonide -formoterol  INHALE 2 PUFFS TWICE DAILY IN THE MORNING AND AT  BEDTIME   traMADol  50 MG tablet Commonly known as: ULTRAM  TAKE 1 TABLET BY MOUTH EVERY 6 HOURS AS NEEDED   trolamine salicylate 10 % cream Commonly known as: ASPERCREME Apply 1 application topically as needed for muscle pain.        Allergies:  Allergies  Allergen Reactions   Montelukast  Sodium Palpitations and Other (See Comments)    REACTION: HEART PALPITATIONS, CHEST PAIN.   Sulfa Antibiotics Other (See Comments)    CHEST PAIN   Sulfonamide Derivatives Other (See Comments)    CHEST PAIN   Ace Inhibitors Other (See Comments)    PT. STATED UNKNOWN REACTION   Indomethacin Other (See Comments)    PT. STATED UNKNOWN REACTION    Past Medical History, Surgical history, Social history, and Family History were reviewed and updated.  Review of Systems: All other 10 point review of systems is negative.   Physical Exam:  height is 5' 2 (1.575 m) and weight is 166 lb (75.3 kg). Her oral temperature is 98.6 F (37 C). Her blood pressure is 103/38 (abnormal) and her pulse is 89. Her respiration is 20 and oxygen  saturation is 97%.   Wt Readings from Last 3 Encounters:  10/26/23 166 lb (75.3 kg)  09/14/23 168 lb 12.8 oz (76.6 kg)  08/03/23 168 lb 1.9 oz (76.3 kg)    Ocular: Sclerae unicteric, pupils equal, round and reactive to light Ear-nose-throat: Oropharynx clear, dentition fair Lymphatic: No cervical or supraclavicular adenopathy Lungs no rales or rhonchi, good excursion bilaterally Heart regular rate and rhythm, no murmur appreciated Abd soft, nontender, positive bowel sounds MSK no focal spinal tenderness, no joint edema Neuro: non-focal, well-oriented, appropriate affect Breasts: Deferred   Lab Results  Component Value Date   WBC 6.2 10/07/2023   HGB 10.5 (L) 10/07/2023   HCT 34.0 (L) 10/07/2023   MCV 98.6 10/07/2023   PLT 112 (L) 10/07/2023   Lab Results  Component Value Date   FERRITIN 700 (H) 09/14/2023   IRON  148 09/14/2023   TIBC 270 09/14/2023    UIBC 122 09/14/2023   IRONPCTSAT 55 (H) 09/14/2023   Lab Results  Component Value Date   RETICCTPCT 0.8 09/14/2023   RBC 3.45 (L) 10/07/2023   No results found for: KPAFRELGTCHN, LAMBDASER, KAPLAMBRATIO No results found for: IGGSERUM, IGA, IGMSERUM No results found for: STEPHANY RINGS, A1GS, A2GS, EARLA JOANNIE KNIGHTS, MSPIKE, SPEI   Chemistry      Component Value Date/Time   NA 142 10/07/2023 1253   NA 142 09/24/2022 1118   K 5.1 10/07/2023 1253   CL 102 10/07/2023 1253   CO2 30 10/07/2023 1253   BUN 38 (H) 10/07/2023 1253   BUN 52 (H) 09/24/2022 1118   CREATININE 1.70 (H) 10/07/2023 1253   CREATININE 1.36 (H) 08/23/2013 1134  Component Value Date/Time   CALCIUM 9.9 10/07/2023 1253   ALKPHOS 41 10/07/2023 1253   AST 17 10/07/2023 1253   ALT 9 10/07/2023 1253   BILITOT 0.7 10/07/2023 1253       Impression and Plan: Ms. Esquer is a very pleasant 83 yo caucasian female with multifactorial anemia.  ESA given for Hgb 10.0.  Iron  studies pending. We will replace if needed.  Lab and injection every 3 weeks, follow-up in 9 weeks.   Lauraine Pepper, NP 10/6/20252:43 PM

## 2023-10-26 NOTE — Patient Instructions (Signed)

## 2023-11-16 ENCOUNTER — Inpatient Hospital Stay

## 2023-11-16 VITALS — BP 122/60 | HR 91 | Temp 98.3°F | Resp 18

## 2023-11-16 DIAGNOSIS — D631 Anemia in chronic kidney disease: Secondary | ICD-10-CM

## 2023-11-16 DIAGNOSIS — D509 Iron deficiency anemia, unspecified: Secondary | ICD-10-CM

## 2023-11-16 DIAGNOSIS — N183 Chronic kidney disease, stage 3 unspecified: Secondary | ICD-10-CM | POA: Diagnosis not present

## 2023-11-16 LAB — CMP (CANCER CENTER ONLY)
ALT: 9 U/L (ref 0–44)
AST: 15 U/L (ref 15–41)
Albumin: 4.2 g/dL (ref 3.5–5.0)
Alkaline Phosphatase: 45 U/L (ref 38–126)
Anion gap: 10 (ref 5–15)
BUN: 30 mg/dL — ABNORMAL HIGH (ref 8–23)
CO2: 30 mmol/L (ref 22–32)
Calcium: 9.8 mg/dL (ref 8.9–10.3)
Chloride: 103 mmol/L (ref 98–111)
Creatinine: 1.63 mg/dL — ABNORMAL HIGH (ref 0.44–1.00)
GFR, Estimated: 31 mL/min — ABNORMAL LOW (ref >60)
Glucose, Bld: 91 mg/dL (ref 70–99)
Potassium: 4.9 mmol/L (ref 3.5–5.1)
Sodium: 143 mmol/L (ref 135–145)
Total Bilirubin: 0.6 mg/dL (ref 0.0–1.2)
Total Protein: 6.7 g/dL (ref 6.5–8.1)

## 2023-11-16 LAB — CBC WITH DIFFERENTIAL (CANCER CENTER ONLY)
Abs Immature Granulocytes: 0.02 K/uL (ref 0.00–0.07)
Basophils Absolute: 0 K/uL (ref 0.0–0.1)
Basophils Relative: 1 %
Eosinophils Absolute: 0.2 K/uL (ref 0.0–0.5)
Eosinophils Relative: 4 %
HCT: 33.9 % — ABNORMAL LOW (ref 36.0–46.0)
Hemoglobin: 10.4 g/dL — ABNORMAL LOW (ref 12.0–15.0)
Immature Granulocytes: 0 %
Lymphocytes Relative: 17 %
Lymphs Abs: 0.9 K/uL (ref 0.7–4.0)
MCH: 30.8 pg (ref 26.0–34.0)
MCHC: 30.7 g/dL (ref 30.0–36.0)
MCV: 100.3 fL — ABNORMAL HIGH (ref 80.0–100.0)
Monocytes Absolute: 0.7 K/uL (ref 0.1–1.0)
Monocytes Relative: 13 %
Neutro Abs: 3.6 K/uL (ref 1.7–7.7)
Neutrophils Relative %: 65 %
Platelet Count: 118 K/uL — ABNORMAL LOW (ref 150–400)
RBC: 3.38 MIL/uL — ABNORMAL LOW (ref 3.87–5.11)
RDW: 15.7 % — ABNORMAL HIGH (ref 11.5–15.5)
WBC Count: 5.6 K/uL (ref 4.0–10.5)
nRBC: 0 % (ref 0.0–0.2)

## 2023-11-16 MED ORDER — EPOETIN ALFA-EPBX 40000 UNIT/ML IJ SOLN
40000.0000 [IU] | Freq: Once | INTRAMUSCULAR | Status: AC
  Administered 2023-11-16: 40000 [IU] via SUBCUTANEOUS
  Filled 2023-11-16: qty 1

## 2023-11-16 MED ORDER — EPINEPHRINE 0.3 MG/0.3ML IJ SOAJ
0.3000 mg | Freq: Once | INTRAMUSCULAR | Status: DC | PRN
Start: 2023-11-16 — End: 2023-11-16

## 2023-11-16 MED ORDER — FAMOTIDINE IN NACL 20-0.9 MG/50ML-% IV SOLN: 20.0000 mg | Freq: Once | INTRAVENOUS | Status: DC | PRN

## 2023-11-16 MED ORDER — DIPHENHYDRAMINE HCL 50 MG/ML IJ SOLN: 50.0000 mg | Freq: Once | INTRAMUSCULAR | Status: DC | PRN

## 2023-11-16 MED ORDER — METHYLPREDNISOLONE SODIUM SUCC 125 MG IJ SOLR
125.0000 mg | Freq: Once | INTRAMUSCULAR | Status: DC | PRN
Start: 2023-11-16 — End: 2023-11-16

## 2023-11-16 MED ORDER — ALBUTEROL SULFATE (2.5 MG/3ML) 0.083% IN NEBU: 2.5000 mg | INHALATION_SOLUTION | Freq: Once | RESPIRATORY_TRACT | Status: DC | PRN

## 2023-11-16 MED ORDER — SODIUM CHLORIDE 0.9 % IV SOLN: Freq: Once | INTRAVENOUS | Status: DC | PRN

## 2023-11-16 NOTE — Patient Instructions (Signed)

## 2023-11-29 ENCOUNTER — Other Ambulatory Visit: Payer: Self-pay | Admitting: Family Medicine

## 2023-11-30 ENCOUNTER — Other Ambulatory Visit: Payer: Self-pay | Admitting: Family Medicine

## 2023-11-30 DIAGNOSIS — M549 Dorsalgia, unspecified: Secondary | ICD-10-CM

## 2023-11-30 NOTE — Telephone Encounter (Signed)
 Requesting: tramadol  50mg  Contract: None UDS: 09/24/21 Last Visit: 06/30/23 Next Visit: None Last Refill: 10/15/23 #90 and 0RF   Please Advise

## 2023-12-02 ENCOUNTER — Telehealth: Payer: Self-pay | Admitting: *Deleted

## 2023-12-02 ENCOUNTER — Encounter

## 2023-12-02 NOTE — Telephone Encounter (Signed)
 Attempted to reach pt x 2 attempts by phone and pt never logged in to mychart for the video visit. Left message to call and r/s.  Ok for E2C2 to r/s on the wellness visit 1 schedule with [employer].

## 2023-12-07 ENCOUNTER — Inpatient Hospital Stay

## 2023-12-07 ENCOUNTER — Inpatient Hospital Stay: Attending: Family

## 2023-12-07 VITALS — BP 124/71 | HR 90 | Temp 98.1°F | Resp 20

## 2023-12-07 DIAGNOSIS — D509 Iron deficiency anemia, unspecified: Secondary | ICD-10-CM

## 2023-12-07 DIAGNOSIS — N183 Chronic kidney disease, stage 3 unspecified: Secondary | ICD-10-CM | POA: Diagnosis not present

## 2023-12-07 DIAGNOSIS — Z79899 Other long term (current) drug therapy: Secondary | ICD-10-CM | POA: Diagnosis not present

## 2023-12-07 DIAGNOSIS — D631 Anemia in chronic kidney disease: Secondary | ICD-10-CM | POA: Insufficient documentation

## 2023-12-07 LAB — CBC WITH DIFFERENTIAL (CANCER CENTER ONLY)
Abs Immature Granulocytes: 0.01 K/uL (ref 0.00–0.07)
Basophils Absolute: 0 K/uL (ref 0.0–0.1)
Basophils Relative: 1 %
Eosinophils Absolute: 0.3 K/uL (ref 0.0–0.5)
Eosinophils Relative: 5 %
HCT: 34.6 % — ABNORMAL LOW (ref 36.0–46.0)
Hemoglobin: 10.6 g/dL — ABNORMAL LOW (ref 12.0–15.0)
Immature Granulocytes: 0 %
Lymphocytes Relative: 17 %
Lymphs Abs: 0.9 K/uL (ref 0.7–4.0)
MCH: 30.5 pg (ref 26.0–34.0)
MCHC: 30.6 g/dL (ref 30.0–36.0)
MCV: 99.4 fL (ref 80.0–100.0)
Monocytes Absolute: 0.6 K/uL (ref 0.1–1.0)
Monocytes Relative: 12 %
Neutro Abs: 3.4 K/uL (ref 1.7–7.7)
Neutrophils Relative %: 65 %
Platelet Count: 118 K/uL — ABNORMAL LOW (ref 150–400)
RBC: 3.48 MIL/uL — ABNORMAL LOW (ref 3.87–5.11)
RDW: 15.3 % (ref 11.5–15.5)
WBC Count: 5.2 K/uL (ref 4.0–10.5)
nRBC: 0 % (ref 0.0–0.2)

## 2023-12-07 LAB — CMP (CANCER CENTER ONLY)
ALT: 9 U/L (ref 0–44)
AST: 18 U/L (ref 15–41)
Albumin: 4.2 g/dL (ref 3.5–5.0)
Alkaline Phosphatase: 49 U/L (ref 38–126)
Anion gap: 11 (ref 5–15)
BUN: 41 mg/dL — ABNORMAL HIGH (ref 8–23)
CO2: 30 mmol/L (ref 22–32)
Calcium: 9.8 mg/dL (ref 8.9–10.3)
Chloride: 101 mmol/L (ref 98–111)
Creatinine: 1.73 mg/dL — ABNORMAL HIGH (ref 0.44–1.00)
GFR, Estimated: 29 mL/min — ABNORMAL LOW (ref >60)
Glucose, Bld: 110 mg/dL — ABNORMAL HIGH (ref 70–99)
Potassium: 4.8 mmol/L (ref 3.5–5.1)
Sodium: 141 mmol/L (ref 135–145)
Total Bilirubin: 0.5 mg/dL (ref 0.0–1.2)
Total Protein: 6.4 g/dL — ABNORMAL LOW (ref 6.5–8.1)

## 2023-12-07 MED ORDER — EPOETIN ALFA-EPBX 40000 UNIT/ML IJ SOLN
40000.0000 [IU] | Freq: Once | INTRAMUSCULAR | Status: AC
  Administered 2023-12-07: 40000 [IU] via SUBCUTANEOUS
  Filled 2023-12-07: qty 1

## 2023-12-07 NOTE — Patient Instructions (Signed)

## 2023-12-12 DIAGNOSIS — Z0100 Encounter for examination of eyes and vision without abnormal findings: Secondary | ICD-10-CM | POA: Diagnosis not present

## 2023-12-23 ENCOUNTER — Encounter: Admitting: Family Medicine

## 2023-12-23 DIAGNOSIS — E782 Mixed hyperlipidemia: Secondary | ICD-10-CM

## 2023-12-23 DIAGNOSIS — E118 Type 2 diabetes mellitus with unspecified complications: Secondary | ICD-10-CM

## 2023-12-23 DIAGNOSIS — Z0000 Encounter for general adult medical examination without abnormal findings: Secondary | ICD-10-CM

## 2023-12-23 NOTE — Progress Notes (Incomplete)
 Complete physical exam  Patient: [name]   DOB: 10/24/1940   83 y.o. Female  MRN: [id number]  Subjective:    No chief complaint on file.   ROSHELL BRIGHAM is a 83 y.o. female who presents today for a complete physical exam. She reports consuming a {diet types:17450} diet. {types:19826} She generally feels {DESC; WELL/FAIRLY WELL/POORLY:18703}. She reports sleeping {DESC; WELL/FAIRLY WELL/POORLY:18703}. She {does/does not:200015} have additional problems to discuss today.   Currently lives with: *** Acute concerns or interim problems since last visit: ***  Chronic Problems  Diabetes: - Checking glucose at home: yes, fasting usually around 100 - Medications: none - Compliance: n/a - Diet: low carb, heart healthy - Exercise: minimal - Eye exam: UTD - Foot exam: today - Microalbumin: today - Denies symptoms of hypoglycemia, polyuria, polydipsia, numbness extremities, foot ulcers/trauma, wounds that are not healing, medication side effects  Lab Results  Component Value Date   HGBA1C 5.7 03/23/2023     Hypertension, CHF, (NSTEMI 2008 with normal coronary arteries on cath, complicated by right groin pseudoaneurysm): - Medications: lasix  40 mg daily (additional PRN, not needing often), metoprolol  succinate 12.5 mg daily. - Compliance: good - Checking BP at home:  - Denies any SOB, recurrent headaches, CP, vision changes, LE edema, dizziness, palpitations, or medication side effects. - Following with Dr. Shlomo, cardiology  - Last seen by cardiology August 2024, no changes, routine follow-up annually  - Echo 2024: EF 50-55% with grade 2 diastolic dysfunction, normal RV, mild MR     Hyperlipidemia (goal LDL < 70): - medications: pravastatin  40 mg daily  - compliance: good - medication SEs: none The ASCVD Risk score (Arnett DK, et al., 2019) failed to calculate for the following reasons:   The 2019 ASCVD risk score is only valid for ages 54 to 78    CKD: - Followed by  [facility]     Gout: - Well controlled with allopurinol  100 mg daily      GERD: - management: pantoprazole  40 mg daily  - symptoms: well controlled     Osteoarthritis, chronic back pain: - She has been taking tramadol  TID PRN for many years and this works well for her.      Chronic respiratory failure, asthma: - Following with Dr. Darlean, pulmonology - Management: Symbicort  BID, PRN albuterol  and duonebs - Chronic O2 2L nasal canula  Mood follow-up: - Diagnosis: anxiety/depression - Treatment: Zoloft  50 mg daily  - Medication side effects: none - SI/HI: none - Update: ***      Osteopenia: - Prolia    Vision concerns: *** Dental concerns: *** STD concerns: ***  ETOH use: *** Nicotine use: none Recreational drugs/illegal substances: ***  Females:  She is {Blank single:19197::currently,not currently,never}  sexually active  Contraception choices are: *** LMP: No LMP recorded. Patient is postmenopausal.      Most recent fall risk assessment:    06/30/2023   12:13 PM  Fall Risk   Falls in the past year? 1  Number falls in past yr: 0  Injury with Fall? 1   Risk for fall due to : History of fall(s)  Follow up Falls evaluation completed     Data saved with a previous flowsheet row definition     Most recent depression screenings:    12/07/2023    2:47 PM 11/16/2023    3:16 PM  PHQ 2/9 Scores  PHQ - 2 Score 0 0          {History (Optional):23778}  Patient Care Team: Almarie Waddell NOVAK, NP as PCP - General (Family Medicine) Shlomo Wilbert SAUNDERS, MD as PCP - Cardiology (Cardiology) Parrett, Madelin RAMAN, NP as Nurse Practitioner (Pulmonary Disease) Rayburn Pac, MD as Consulting Physician (Nephrology) Brinda Elsie BRAVO, OD (Optometry) Darlean Ozell NOVAK, MD as Consulting Physician (Pulmonary Disease) Timmy Maude SAUNDERS, MD as Consulting Physician (Oncology)   Outpatient Medications Prior to Visit  Medication Sig   ACCU-CHEK GUIDE TEST test strip  TEST BLOOD SUGAR EVERY DAY AND AS NEEDED   Accu-Chek Softclix Lancets lancets TEST BLOOD SUGAR EVERY DAY AND AS NEEDED   albuterol  (VENTOLIN  HFA) 108 (90 Base) MCG/ACT inhaler Inhale 2 puffs into the lungs every 4 (four) hours as needed for wheezing or shortness of breath.   Alcohol  Swabs (DROPSAFE ALCOHOL  PREP) 70 % PADS USE AS DIRECTED  BEFORE  CHECKING BLOOD SUGAR   allopurinol  (ZYLOPRIM ) 100 MG tablet TAKE 2 TABLETS ONE TIME DAILY   b complex vitamins tablet Take 1 tablet by mouth daily.   Blood Glucose Calibration (ACCU-CHEK AVIVA) SOLN USE AS DIRECTED WITH GLUCOMETER   Blood Glucose Monitoring Suppl (ACCU-CHEK GUIDE) w/Device KIT USE AS DIRECTED TO CHECK BLOOD SUGAR   denosumab  (PROLIA ) 60 MG/ML SOSY injection To be given in office.  Pt has appt 08/15/22.  Dx code: M81.0   fluticasone  (FLONASE ) 50 MCG/ACT nasal spray USE 2 SPRAYS IN BOTH NOSTRILS DAILY   furosemide  (LASIX ) 40 MG tablet TAKE 1 TABLET DAILY , TAKE SECOND TABLET DAILY AS NEEDED FOR INCREASED EDEMA OR WEIGHT GAIN OVER 3 LBS/24 HRS   ipratropium-albuterol  (DUONEB) 0.5-2.5 (3) MG/3ML SOLN Take 3 mLs by nebulization every 4 (four) hours as needed.   loratadine  (CLARITIN ) 10 MG tablet Take 1 tablet (10 mg total) by mouth at bedtime. (Patient taking differently: Take 10 mg by mouth at bedtime as needed for allergies.)   metoprolol  succinate (TOPROL -XL) 25 MG 24 hr tablet Take 12.5 mg by mouth daily.   OXYGEN  Inhale 2 L into the lungs continuous.    pantoprazole  (PROTONIX ) 40 MG tablet TAKE 1 TABLET EVERY DAY   polyethylene glycol (MIRALAX  / GLYCOLAX ) 17 g packet Take 17 g by mouth daily.   pravastatin  (PRAVACHOL ) 40 MG tablet TAKE 1 TABLET EVERY DAY   Probiotic Product (PROBIOTIC PO) Take 1 tablet by mouth daily.    sertraline  (ZOLOFT ) 50 MG tablet Take 1 tablet (50 mg total) by mouth daily.   SYMBICORT  160-4.5 MCG/ACT inhaler INHALE 2 PUFFS TWICE DAILY IN THE MORNING AND AT BEDTIME   traMADol  (ULTRAM ) 50 MG tablet Take 1 tablet (50  mg total) by mouth every 6 (six) hours as needed. Appt required for refills.   vitamin B-12 (CYANOCOBALAMIN ) 1000 MCG tablet Take 1,000 mcg by mouth daily.    [DISCONTINUED] docusate sodium  (COLACE) 100 MG capsule Take 1 capsule (100 mg total) by mouth 2 (two) times daily.   [DISCONTINUED] trolamine salicylate (ASPERCREME) 10 % cream Apply 1 application topically as needed for muscle pain.   Facility-Administered Medications Prior to Visit  Medication Dose Route Frequency Provider   [START ON 02/28/2024] denosumab  (PROLIA ) injection 60 mg  60 mg Subcutaneous Q6 months Domenica Harlene LABOR, MD    ROS        Objective:     There were no vitals taken for this visit. {Vitals History (Optional):23777}  Physical Exam   No results found for any visits on 12/23/23. {Show previous labs (optional):23779}    Assessment & Plan:    Routine Health Maintenance and  Physical Exam Discussed health promotion and safety including diet and exercise recommendations, dental health, and injury prevention. Tobacco cessation if applicable. Seat belts, sunscreen, smoke detectors, etc.    Immunization History  Administered Date(s) Administered   Fluad Quad(high Dose 65+) 11/11/2018, 11/18/2021   Fluad Trivalent(High Dose 65+) 09/16/2022   INFLUENZA, HIGH DOSE SEASONAL PF 09/25/2015, 10/21/2015, 10/26/2017   Influenza Split 12/08/2011   Influenza Whole 10/20/2006, 11/09/2007, 11/14/2008, 10/30/2009   Influenza,inj,Quad PF,6+ Mos 10/20/2012, 11/28/2013, 10/02/2014, 09/18/2016   PFIZER(Purple Top)SARS-COV-2 Vaccination 03/19/2019, 05/03/2019, 01/04/2020   Pneumococcal Conjugate-13 02/21/2013   Pneumococcal Polysaccharide-23 06/30/2006   Td 06/01/2007, 07/25/2020   Zoster Recombinant(Shingrix ) 04/09/2021, 04/21/2022    Health Maintenance  Topic Date Due   Influenza Vaccine  08/21/2023   HEMOGLOBIN A1C  09/23/2023   COVID-19 Vaccine (4 - 2025-26 season) 12/21/2024 (Originally 09/21/2023)   FOOT EXAM   03/22/2024   Medicare Annual Wellness (AWV)  03/22/2024   OPHTHALMOLOGY EXAM  09/14/2024   Diabetic kidney evaluation - Urine ACR  10/01/2024   Diabetic kidney evaluation - eGFR measurement  12/06/2024   DTaP/Tdap/Td (3 - Tdap) 07/26/2030   Pneumococcal Vaccine: 50+ Years  Completed   Bone Density Scan  Completed   Zoster Vaccines- Shingrix   Completed   Meningococcal B Vaccine  Aged Out   Mammogram  Discontinued   Colonoscopy  Discontinued   Hepatitis C Screening  Discontinued        Problem List Items Addressed This Visit     Hyperlipidemia, mixed (Chronic)   Other Visit Diagnoses       Annual physical exam    -  Primary     Controlled type 2 diabetes mellitus with complication, without long-term current use of insulin (HCC)              PATIENT COUNSELING:    Recommend that most people either abstain from alcohol  or drink within safe limits (<=14/week and <=4 drinks/occasion for males, <=7/weeks and <= 3 drinks/occasion for females) and that the risk for alcohol  disorders and other health effects rises proportionally with the number of drinks per week and how often a drinker exceeds daily limits.   Diet: Recommend to adjust caloric intake to maintain or achieve ideal body weight, to reduce intake of dietary saturated fat and total fat, to limit sodium intake by avoiding high sodium foods and not adding table salt, and to maintain adequate dietary potassium and calcium preferably from fresh fruits, vegetables, and low-fat dairy products.   Emphasized the importance of regular exercise.  Injury prevention: Recommend seatbelts, safety helmets, smoke detector, etc..   Dental health: Recommend regular tooth brushing, flossing, and dental visits.       No follow-ups on file.     Waddell KATHEE Mon, NP  I,Emily Lagle,acting as a scribe for Waddell KATHEE Mon, NP.,have documented all relevant documentation on the behalf of Waddell KATHEE Mon, NP.  I, Waddell KATHEE Mon, NP, have  reviewed all documentation for this visit. The documentation on 12/23/2023 for the exam, diagnosis, procedures, and orders are all accurate and complete.

## 2023-12-28 ENCOUNTER — Ambulatory Visit

## 2023-12-28 ENCOUNTER — Inpatient Hospital Stay: Admitting: Family

## 2023-12-28 ENCOUNTER — Inpatient Hospital Stay

## 2023-12-28 ENCOUNTER — Encounter: Admitting: Family Medicine

## 2023-12-30 ENCOUNTER — Ambulatory Visit: Admitting: Family Medicine

## 2023-12-30 ENCOUNTER — Encounter: Payer: Self-pay | Admitting: Family Medicine

## 2023-12-30 VITALS — BP 106/55 | HR 71 | Temp 97.9°F | Ht 62.0 in | Wt 164.2 lb

## 2023-12-30 DIAGNOSIS — I10 Essential (primary) hypertension: Secondary | ICD-10-CM | POA: Diagnosis not present

## 2023-12-30 DIAGNOSIS — E118 Type 2 diabetes mellitus with unspecified complications: Secondary | ICD-10-CM

## 2023-12-30 DIAGNOSIS — Z0000 Encounter for general adult medical examination without abnormal findings: Secondary | ICD-10-CM | POA: Diagnosis not present

## 2023-12-30 DIAGNOSIS — E782 Mixed hyperlipidemia: Secondary | ICD-10-CM

## 2023-12-30 DIAGNOSIS — N184 Chronic kidney disease, stage 4 (severe): Secondary | ICD-10-CM

## 2023-12-30 DIAGNOSIS — Z8639 Personal history of other endocrine, nutritional and metabolic disease: Secondary | ICD-10-CM | POA: Diagnosis not present

## 2023-12-30 DIAGNOSIS — M549 Dorsalgia, unspecified: Secondary | ICD-10-CM | POA: Diagnosis not present

## 2023-12-30 DIAGNOSIS — Z79899 Other long term (current) drug therapy: Secondary | ICD-10-CM | POA: Diagnosis not present

## 2023-12-30 LAB — HEMOGLOBIN A1C: Hgb A1c MFr Bld: 5.3 % (ref 4.6–6.5)

## 2023-12-30 LAB — LIPID PANEL
Cholesterol: 141 mg/dL (ref 0–200)
HDL: 60.7 mg/dL (ref >39.00)
LDL Cholesterol: 59 mg/dL (ref 0–99)
NonHDL: 80.27
Total CHOL/HDL Ratio: 2
Triglycerides: 107 mg/dL (ref 0.0–149.0)
VLDL: 21.4 mg/dL (ref 0.0–40.0)

## 2023-12-30 MED ORDER — TRAMADOL HCL 50 MG PO TABS: 50.0000 mg | ORAL_TABLET | Freq: Three times a day (TID) | ORAL | 0 refills | Status: DC | PRN

## 2023-12-30 NOTE — Assessment & Plan Note (Signed)
 Blood pressure is at goal for age and co-morbidities.   Recommendations: continue current meds, following with cardiology (due for follow-up - patient to schedule) - BP goal <130/80 - monitor and log blood pressures at home - check around the same time each day in a relaxed setting - Limit salt to <2000 mg/day - Follow DASH eating plan (heart healthy diet) - limit alcohol  to 2 standard drinks per day for men and 1 per day for women - avoid tobacco products - get at least 2 hours of regular aerobic exercise weekly Patient aware of signs/symptoms requiring further/urgent evaluation.

## 2023-12-30 NOTE — Patient Instructions (Signed)
 You are due for follow-up with your cardiologist - please schedule with them.

## 2023-12-30 NOTE — Assessment & Plan Note (Signed)
 Chronic tramadol  use. Safety discussed. PDMP reviewed. Contract/UDS updated.

## 2023-12-30 NOTE — Assessment & Plan Note (Signed)
 Medication management: pravastatin 40 mg daily Lifestyle factors for lowering cholesterol include: Diet therapy - heart-healthy diet rich in fruits, veggies, fiber-rich whole grains, lean meats, chicken, fish (at least twice a week), fat-free or 1% dairy products; foods low in saturated/trans fats, cholesterol, sodium, and sugar. Mediterranean diet has shown to be very heart healthy. Regular exercise - recommend at least 30 minutes a day, 5 times per week Weight management

## 2023-12-30 NOTE — Progress Notes (Addendum)
 Complete physical exam  Patient: [name]   DOB: 11/11/1940   83 y.o. Female  MRN: [id number]  Subjective:    Chief Complaint  Patient presents with   Medication Refill    Rx refill on Tramadol  Patient stated that she would like to discuss dosage change   Annual Exam    [name] is a 83 y.o. female who presents today for a complete physical exam. She reports consuming a general diet. She is trying to stay active, walking.  She generally feels well. She reports sleeping well. She does not have additional problems to discuss today.   Currently lives with: no Acute concerns or interim problems since last visit: no  Chronic Problems  Hypertension, CHF, (NSTEMI 2008 with normal coronary arteries on cath, complicated by right groin pseudoaneurysm): - Medications: lasix  40 mg daily (additional PRN, not needing often), metoprolol  succinate 12.5 mg daily,  - Compliance: good - Checking BP at home:  - Denies any SOB, recurrent headaches, CP, vision changes, LE edema, dizziness, palpitations, or medication side effects. - Following with Dr. Shlomo, cardiology  - Last seen by cardiology August 2024, no changes, routine follow-up annually  - Echo 2023: EF 50-55% with grade 1 diastolic dysfunction, normal RV, mild MR     Hyperlipidemia (goal LDL < 70): - medications: pravastatin  40 mg daily  - compliance: good - medication SEs: none The ASCVD Risk score (Arnett DK, et al., 2019) failed to calculate for the following reasons:   The 2019 ASCVD risk score is only valid for ages 74 to 10   * - Cholesterol units were assumed  Diabetes: - Checking glucose at home: yes, fasting usually around 100 - Medications: none - Compliance: n/a - Diet: low carb, heart healthy - Exercise: minimal - Eye exam: UTD - Foot exam: UTD 03/23/2023 - Microalbumin: UTD 03/23/2023 - Denies symptoms of hypoglycemia, polyuria, polydipsia, numbness extremities, foot ulcers/trauma, wounds that are not  healing, medication side effects  Lab Results  Component Value Date   HGBA1C 5.3 12/30/2023    Chronic Kidney Disease: - Followed by kidney specialist at [facility].    Chronic pain: - She has been on tramadol  50 mg TID for many years - safety has been discussed.      Vision concerns: no Dental concerns: no STD concerns: no  ETOH use: no Nicotine use: no Recreational drugs/illegal substances: no        Most recent fall risk assessment:    12/30/2023    9:44 AM  Fall Risk   Falls in the past year? 1  Number falls in past yr: 0  Injury with Fall? 0  Risk for fall due to : History of fall(s)  Follow up Falls evaluation completed     Most recent depression screenings:    12/30/2023    9:44 AM 12/07/2023    2:47 PM  PHQ 2/9 Scores  PHQ - 2 Score 2 0  PHQ- 9 Score 7             Patient Care Team: Almarie Waddell NOVAK, NP as PCP - General (Family Medicine) Shlomo Wilbert SAUNDERS, MD as PCP - Cardiology (Cardiology) Parrett, Madelin RAMAN, NP as Nurse Practitioner (Pulmonary Disease) Rayburn Pac, MD as Consulting Physician (Nephrology) Brinda Elsie BRAVO, OD (Optometry) Darlean Ozell NOVAK, MD as Consulting Physician (Pulmonary Disease) Timmy Maude SAUNDERS, MD as Consulting Physician (Oncology)   Outpatient Medications Prior to Visit  Medication Sig   ACCU-CHEK GUIDE TEST test  strip TEST BLOOD SUGAR EVERY DAY AND AS NEEDED   Accu-Chek Softclix Lancets lancets TEST BLOOD SUGAR EVERY DAY AND AS NEEDED   albuterol  (VENTOLIN  HFA) 108 (90 Base) MCG/ACT inhaler Inhale 2 puffs into the lungs every 4 (four) hours as needed for wheezing or shortness of breath.   Alcohol  Swabs (DROPSAFE ALCOHOL  PREP) 70 % PADS USE AS DIRECTED  BEFORE  CHECKING BLOOD SUGAR   allopurinol  (ZYLOPRIM ) 100 MG tablet TAKE 2 TABLETS ONE TIME DAILY   b complex vitamins tablet Take 1 tablet by mouth daily.   Blood Glucose Calibration (ACCU-CHEK AVIVA) SOLN USE AS DIRECTED WITH GLUCOMETER   Blood  Glucose Monitoring Suppl (ACCU-CHEK GUIDE) w/Device KIT USE AS DIRECTED TO CHECK BLOOD SUGAR   denosumab  (PROLIA ) 60 MG/ML SOSY injection To be given in office.  Pt has appt 08/15/22.  Dx code: M81.0   fluticasone  (FLONASE ) 50 MCG/ACT nasal spray USE 2 SPRAYS IN BOTH NOSTRILS DAILY   furosemide  (LASIX ) 40 MG tablet TAKE 1 TABLET DAILY , TAKE SECOND TABLET DAILY AS NEEDED FOR INCREASED EDEMA OR WEIGHT GAIN OVER 3 LBS/24 HRS   ipratropium-albuterol  (DUONEB) 0.5-2.5 (3) MG/3ML SOLN Take 3 mLs by nebulization every 4 (four) hours as needed.   loratadine  (CLARITIN ) 10 MG tablet Take 1 tablet (10 mg total) by mouth at bedtime. (Patient taking differently: Take 10 mg by mouth at bedtime as needed for allergies.)   metoprolol  succinate (TOPROL -XL) 25 MG 24 hr tablet Take 12.5 mg by mouth daily.   OXYGEN  Inhale 2 L into the lungs continuous.    pantoprazole  (PROTONIX ) 40 MG tablet TAKE 1 TABLET EVERY DAY   polyethylene glycol (MIRALAX  / GLYCOLAX ) 17 g packet Take 17 g by mouth daily.   pravastatin  (PRAVACHOL ) 40 MG tablet TAKE 1 TABLET EVERY DAY   Probiotic Product (PROBIOTIC PO) Take 1 tablet by mouth daily.    sertraline  (ZOLOFT ) 50 MG tablet Take 1 tablet (50 mg total) by mouth daily.   SYMBICORT  160-4.5 MCG/ACT inhaler INHALE 2 PUFFS TWICE DAILY IN THE MORNING AND AT BEDTIME   vitamin B-12 (CYANOCOBALAMIN ) 1000 MCG tablet Take 1,000 mcg by mouth daily.    [DISCONTINUED] traMADol  (ULTRAM ) 50 MG tablet Take 1 tablet (50 mg total) by mouth every 6 (six) hours as needed. Appt required for refills.   Facility-Administered Medications Prior to Visit  Medication Dose Route Frequency Provider   [START ON 02/28/2024] denosumab  (PROLIA ) injection 60 mg  60 mg Subcutaneous Q6 months Domenica Harlene LABOR, MD    ROS All review of systems negative except what is listed in the HPI        Objective:     BP (!) 106/55 (BP Location: Right Arm, Patient Position: Sitting, Cuff Size: Normal)   Pulse 71   Temp 97.9  F (36.6 C) (Oral)   Ht 5' 2 (1.575 m)   Wt 164 lb 3.2 oz (74.5 kg)   SpO2 93%   BMI 30.03 kg/m    Physical Exam Vitals reviewed.  Constitutional:      General: She is not in acute distress.    Appearance: Normal appearance. She is not ill-appearing.  HENT:     Head: Normocephalic and atraumatic.     Right Ear: Tympanic membrane normal.     Left Ear: Tympanic membrane normal.     Nose: Nose normal.     Mouth/Throat:     Mouth: Mucous membranes are moist.     Pharynx: Oropharynx is clear.  Eyes:  Extraocular Movements: Extraocular movements intact.     Conjunctiva/sclera: Conjunctivae normal.     Pupils: Pupils are equal, round, and reactive to light.  Neck:     Vascular: No carotid bruit.  Cardiovascular:     Rate and Rhythm: Normal rate and regular rhythm.     Pulses: Normal pulses.     Heart sounds: Murmur heard.  Pulmonary:     Effort: Pulmonary effort is normal.     Breath sounds: Normal breath sounds.  Abdominal:     General: Abdomen is flat. Bowel sounds are normal. There is no distension.     Palpations: Abdomen is soft. There is no mass.     Tenderness: There is no abdominal tenderness. There is no right CVA tenderness, left CVA tenderness, guarding or rebound.  Genitourinary:    Comments: Deferred exam Musculoskeletal:        General: Normal range of motion.     Cervical back: Normal range of motion and neck supple. No tenderness.     Right lower leg: 1+ Edema present.     Left lower leg: 1+ Edema present.  Lymphadenopathy:     Cervical: No cervical adenopathy.  Skin:    General: Skin is warm and dry.     Capillary Refill: Capillary refill takes less than 2 seconds.  Neurological:     General: No focal deficit present.     Mental Status: She is alert and oriented to person, place, and time. Mental status is at baseline.  Psychiatric:        Mood and Affect: Mood normal.        Behavior: Behavior normal.        Thought Content: Thought content  normal.        Judgment: Judgment normal.      Results for orders placed or performed in visit on 12/30/23  Hemoglobin A1c  Result Value Ref Range   Hgb A1c MFr Bld 5.3 4.6 - 6.5 %  Lipid panel  Result Value Ref Range   Cholesterol 141 0 - 200 mg/dL   Triglycerides 892.9 0.0 - 149.0 mg/dL   HDL 39.29 >60.99 mg/dL   VLDL 78.5 0.0 - 59.9 mg/dL   LDL Cholesterol 59 0 - 99 mg/dL   Total CHOL/HDL Ratio 2    NonHDL 80.27        Assessment & Plan:    Routine Health Maintenance and Physical Exam Discussed health promotion and safety including diet and exercise recommendations, dental health, and injury prevention. Tobacco cessation if applicable. Seat belts, sunscreen, smoke detectors, etc.    Immunization History  Administered Date(s) Administered   Fluad Quad(high Dose 65+) 11/11/2018, 11/18/2021   Fluad Trivalent(High Dose 65+) 09/16/2022   INFLUENZA, HIGH DOSE SEASONAL PF 09/25/2015, 10/21/2015, 10/26/2017   Influenza Split 12/08/2011   Influenza Whole 10/20/2006, 11/09/2007, 11/14/2008, 10/30/2009   Influenza,inj,Quad PF,6+ Mos 10/20/2012, 11/28/2013, 10/02/2014, 09/18/2016   PFIZER(Purple Top)SARS-COV-2 Vaccination 03/19/2019, 05/03/2019, 01/04/2020   Pneumococcal Conjugate-13 02/21/2013   Pneumococcal Polysaccharide-23 06/30/2006   Td 06/01/2007, 07/25/2020   Zoster Recombinant(Shingrix ) 04/09/2021, 04/21/2022    Health Maintenance  Topic Date Due   Influenza Vaccine  08/21/2023   COVID-19 Vaccine (4 - 2025-26 season) 12/21/2024 (Originally 09/21/2023)   FOOT EXAM  03/22/2024   Medicare Annual Wellness (AWV)  03/22/2024   HEMOGLOBIN A1C  06/29/2024   OPHTHALMOLOGY EXAM  09/14/2024   Diabetic kidney evaluation - Urine ACR  10/01/2024   Diabetic kidney evaluation - eGFR measurement  12/06/2024  DTaP/Tdap/Td (3 - Tdap) 07/26/2030   Pneumococcal Vaccine: 50+ Years  Completed   Bone Density Scan  Completed   Zoster Vaccines- Shingrix   Completed   Meningococcal B  Vaccine  Aged Out   Mammogram  Discontinued   Colonoscopy  Discontinued   Hepatitis C Screening  Discontinued        Problem List Items Addressed This Visit       Active Problems   Hyperlipidemia, mixed (Chronic)   Medication management: pravastatin  40 mg daily Lifestyle factors for lowering cholesterol include: Diet therapy - heart-healthy diet rich in fruits, veggies, fiber-rich whole grains, lean meats, chicken, fish (at least twice a week), fat-free or 1% dairy products; foods low in saturated/trans fats, cholesterol, sodium, and sugar. Mediterranean diet has shown to be very heart healthy. Regular exercise - recommend at least 30 minutes a day, 5 times per week Weight management         Relevant Orders   Lipid panel (Completed)   Essential hypertension (Chronic)   Blood pressure is at goal for age and co-morbidities.   Recommendations: continue current meds, following with cardiology (due for follow-up - patient to schedule) - BP goal <130/80 - monitor and log blood pressures at home - check around the same time each day in a relaxed setting - Limit salt to <2000 mg/day - Follow DASH eating plan (heart healthy diet) - limit alcohol  to 2 standard drinks per day for men and 1 per day for women - avoid tobacco products - get at least 2 hours of regular aerobic exercise weekly Patient aware of signs/symptoms requiring further/urgent evaluation.        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HCC) (Chronic)   Following with [facility] No acute concerns. Recent labs stable.       Back pain   Chronic tramadol  use. Safety discussed. PDMP reviewed. Contract/UDS updated.       Relevant Medications   traMADol  (ULTRAM ) 50 MG tablet   Other Visit Diagnoses       Annual physical exam    -  Primary     High risk medication use       Relevant Orders   Drug Monitoring Panel [phone] , Urine     History of diabetes mellitus       Relevant Orders   Hemoglobin A1c (Completed)          PATIENT COUNSELING:    Recommend that most people either abstain from alcohol  or drink within safe limits (<=14/week and <=4 drinks/occasion for males, <=7/weeks and <= 3 drinks/occasion for females) and that the risk for alcohol  disorders and other health effects rises proportionally with the number of drinks per week and how often a drinker exceeds daily limits.   Diet: Recommend to adjust caloric intake to maintain or achieve ideal body weight, to reduce intake of dietary saturated fat and total fat, to limit sodium intake by avoiding high sodium foods and not adding table salt, and to maintain adequate dietary potassium and calcium preferably from fresh fruits, vegetables, and low-fat dairy products.   Emphasized the importance of regular exercise.  Injury prevention: Recommend seatbelts, safety helmets, smoke detector, etc..   Dental health: Recommend regular tooth brushing, flossing, and dental visits.       Return in about 3 months (around 03/29/2024) for chronic disease management; schedule cardiology follow-up.     Waddell KATHEE Mon, NP  I,Emily Lagle,acting as a scribe for Waddell KATHEE Mon, NP.,have documented all relevant documentation on  the behalf of Waddell KATHEE Mon, NP.  I, Waddell KATHEE Mon, NP, have reviewed all documentation for this visit. The documentation on 12/30/2023 for the exam, diagnosis, procedures, and orders are all accurate and complete.

## 2023-12-30 NOTE — Assessment & Plan Note (Signed)
 Following with [facility] No acute concerns. Recent labs stable.

## 2023-12-31 ENCOUNTER — Ambulatory Visit: Payer: Self-pay | Admitting: Family Medicine

## 2024-01-01 ENCOUNTER — Inpatient Hospital Stay

## 2024-01-01 ENCOUNTER — Encounter: Payer: Self-pay | Admitting: Family

## 2024-01-01 ENCOUNTER — Inpatient Hospital Stay: Admitting: Family

## 2024-01-01 ENCOUNTER — Inpatient Hospital Stay: Attending: Family

## 2024-01-01 VITALS — BP 140/68 | HR 98 | Temp 97.9°F | Resp 20 | Ht 62.0 in | Wt 167.8 lb

## 2024-01-01 DIAGNOSIS — D509 Iron deficiency anemia, unspecified: Secondary | ICD-10-CM | POA: Diagnosis not present

## 2024-01-01 DIAGNOSIS — D631 Anemia in chronic kidney disease: Secondary | ICD-10-CM

## 2024-01-01 DIAGNOSIS — N184 Chronic kidney disease, stage 4 (severe): Secondary | ICD-10-CM | POA: Diagnosis not present

## 2024-01-01 DIAGNOSIS — N183 Chronic kidney disease, stage 3 unspecified: Secondary | ICD-10-CM | POA: Diagnosis present

## 2024-01-01 LAB — CBC WITH DIFFERENTIAL (CANCER CENTER ONLY)
Abs Immature Granulocytes: 0.02 K/uL (ref 0.00–0.07)
Basophils Absolute: 0 K/uL (ref 0.0–0.1)
Basophils Relative: 1 %
Eosinophils Absolute: 0.4 K/uL (ref 0.0–0.5)
Eosinophils Relative: 6 %
HCT: 35.5 % — ABNORMAL LOW (ref 36.0–46.0)
Hemoglobin: 10.8 g/dL — ABNORMAL LOW (ref 12.0–15.0)
Immature Granulocytes: 0 %
Lymphocytes Relative: 16 %
Lymphs Abs: 1 K/uL (ref 0.7–4.0)
MCH: 30.2 pg (ref 26.0–34.0)
MCHC: 30.4 g/dL (ref 30.0–36.0)
MCV: 99.2 fL (ref 80.0–100.0)
Monocytes Absolute: 0.7 K/uL (ref 0.1–1.0)
Monocytes Relative: 11 %
Neutro Abs: 4.2 K/uL (ref 1.7–7.7)
Neutrophils Relative %: 66 %
Platelet Count: 156 K/uL (ref 150–400)
RBC: 3.58 MIL/uL — ABNORMAL LOW (ref 3.87–5.11)
RDW: 15.6 % — ABNORMAL HIGH (ref 11.5–15.5)
WBC Count: 6.3 K/uL (ref 4.0–10.5)
nRBC: 0 % (ref 0.0–0.2)

## 2024-01-01 LAB — CMP (CANCER CENTER ONLY)
ALT: 8 U/L (ref 0–44)
AST: 17 U/L (ref 15–41)
Albumin: 4.4 g/dL (ref 3.5–5.0)
Alkaline Phosphatase: 51 U/L (ref 38–126)
Anion gap: 10 (ref 5–15)
BUN: 36 mg/dL — ABNORMAL HIGH (ref 8–23)
CO2: 31 mmol/L (ref 22–32)
Calcium: 9.6 mg/dL (ref 8.9–10.3)
Chloride: 99 mmol/L (ref 98–111)
Creatinine: 2.01 mg/dL — ABNORMAL HIGH (ref 0.44–1.00)
GFR, Estimated: 24 mL/min — ABNORMAL LOW (ref >60)
Glucose, Bld: 77 mg/dL (ref 70–99)
Potassium: 4.7 mmol/L (ref 3.5–5.1)
Sodium: 141 mmol/L (ref 135–145)
Total Bilirubin: 0.5 mg/dL (ref 0.0–1.2)
Total Protein: 6.9 g/dL (ref 6.5–8.1)

## 2024-01-01 LAB — DRUG MONITORING PANEL 376104, URINE
Amphetamines: NEGATIVE ng/mL (ref <500)
Barbiturates: NEGATIVE ng/mL (ref <300)
Benzodiazepines: NEGATIVE ng/mL (ref <100)
Cocaine Metabolite: NEGATIVE ng/mL (ref <150)
Desmethyltramadol: >10000 ng/mL — ABNORMAL HIGH (ref <100)
Opiates: NEGATIVE ng/mL (ref <100)
Oxycodone: NEGATIVE ng/mL (ref <100)
Tramadol: >10000 ng/mL — ABNORMAL HIGH (ref <100)

## 2024-01-01 LAB — IRON AND IRON BINDING CAPACITY (CC-WL,HP ONLY)
Iron: 128 ug/dL (ref 28–170)
Saturation Ratios: 54 % — ABNORMAL HIGH (ref 10.4–31.8)
TIBC: 238 ug/dL — ABNORMAL LOW (ref 250–450)
UIBC: 110 ug/dL

## 2024-01-01 LAB — RETICULOCYTES
Immature Retic Fract: 13 % (ref 2.3–15.9)
RBC.: 3.52 MIL/uL — ABNORMAL LOW (ref 3.87–5.11)
Retic Count, Absolute: 25.7 K/uL (ref 19.0–186.0)
Retic Ct Pct: 0.7 % (ref 0.4–3.1)

## 2024-01-01 LAB — DM TEMPLATE

## 2024-01-01 LAB — FERRITIN: Ferritin: 744 ng/mL — ABNORMAL HIGH (ref 11–307)

## 2024-01-01 MED ORDER — EPOETIN ALFA-EPBX 40000 UNIT/ML IJ SOLN
40000.0000 [IU] | Freq: Once | INTRAMUSCULAR | Status: AC
  Administered 2024-01-01: 40000 [IU] via SUBCUTANEOUS
  Filled 2024-01-01: qty 1

## 2024-01-01 NOTE — Patient Instructions (Signed)

## 2024-01-01 NOTE — Progress Notes (Signed)
 Hematology and Oncology Follow Up Visit  TECORA EUSTACHE [id number] 1940-04-01 83 y.o. 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 She is doing fairly well. She has chronic fatigue and SOB with COPD.  She is doing well on 2L supplemental O2 24 hours a day.  She has occasional episodes of dizziness and rare quick sharp pains in the mid chest that goes away without intervention.  No falls or syncope reported.  No fever, chills, n/v, cough, rash, palpitations, abdominal pain or changes in bowel or bladder habits.  Chronic swelling in her legs described as stable.  Appetite and hydration are good. Weight is stable at 167 lbs.   ECOG Performance Status: 1 - Symptomatic but completely ambulatory  Medications:  Allergies as of 01/01/2024       Reactions   Montelukast  Sodium Palpitations, Other (See Comments)   REACTION: HEART PALPITATIONS, CHEST PAIN.   Sulfa Antibiotics Other (See Comments)   CHEST PAIN   Sulfonamide Derivatives Other (See Comments)   CHEST PAIN   Ace Inhibitors Other (See Comments)   PT. STATED UNKNOWN REACTION   Indomethacin Other (See Comments)   PT. STATED UNKNOWN REACTION        Medication List        Accurate as of January 01, 2024  1:27 PM. If you have any questions, ask your nurse or doctor.          Accu-Chek Aviva Soln USE AS DIRECTED WITH GLUCOMETER   Accu-Chek Guide Test test strip Generic drug: glucose blood TEST BLOOD SUGAR EVERY DAY AND AS NEEDED   Accu-Chek Guide w/Device Kit USE AS DIRECTED TO CHECK BLOOD SUGAR   Accu-Chek Softclix Lancets lancets TEST BLOOD SUGAR EVERY DAY AND AS NEEDED   albuterol  108 (90 Base) MCG/ACT inhaler Commonly known as: VENTOLIN  HFA Inhale 2 puffs into the lungs every 4 (four) hours as needed for wheezing  or shortness of breath.   allopurinol  100 MG tablet Commonly known as: ZYLOPRIM  TAKE 2 TABLETS ONE TIME DAILY   b complex vitamins tablet Take 1 tablet by mouth daily.   cyanocobalamin  1000 MCG tablet Commonly known as: VITAMIN B12 Take 1,000 mcg by mouth daily.   DropSafe Alcohol  Prep 70 % Pads USE AS DIRECTED  BEFORE  CHECKING BLOOD SUGAR   fluticasone  50 MCG/ACT nasal spray Commonly known as: FLONASE  USE 2 SPRAYS IN BOTH NOSTRILS DAILY   furosemide  40 MG tablet Commonly known as: LASIX  TAKE 1 TABLET DAILY , TAKE SECOND TABLET DAILY AS NEEDED FOR INCREASED EDEMA OR WEIGHT GAIN OVER 3 LBS/24 HRS   ipratropium-albuterol  0.5-2.5 (3) MG/3ML Soln Commonly known as: DUONEB Take 3 mLs by nebulization every 4 (four) hours as needed.   loratadine  10 MG tablet Commonly known as: CLARITIN  Take 1 tablet (10 mg total) by mouth at bedtime.   metoprolol  succinate 25 MG 24 hr tablet Commonly known as: TOPROL -XL Take 12.5 mg by mouth daily.   OXYGEN  Inhale 2 L into the lungs continuous.   pantoprazole  40 MG tablet Commonly known as: PROTONIX  TAKE 1 TABLET EVERY DAY   polyethylene glycol 17 g packet Commonly known as: MIRALAX  / GLYCOLAX  Take 17 g by mouth daily.   pravastatin  40 MG tablet Commonly known as: PRAVACHOL  TAKE 1 TABLET  EVERY DAY   PROBIOTIC PO Take 1 tablet by mouth daily.   Prolia  60 MG/ML Sosy injection Generic drug: denosumab  To be given in office.  Pt has appt 08/15/22.  Dx code: M81.0   sertraline  50 MG tablet Commonly known as: ZOLOFT  Take 1 tablet (50 mg total) by mouth daily.   Symbicort  160-4.5 MCG/ACT inhaler Generic drug: budesonide -formoterol  INHALE 2 PUFFS TWICE DAILY IN THE MORNING AND AT BEDTIME   traMADol  50 MG tablet Commonly known as: ULTRAM  Take 1 tablet (50 mg total) by mouth 3 (three) times daily as needed. Appt required for refills.        Allergies: Allergies[1]  Past Medical History, Surgical history, Social history, and  Family History were reviewed and updated.  Review of Systems: All other 10 point review of systems is negative.   Physical Exam:  height is 5' 2 (1.575 m) and weight is 167 lb 12.8 oz (76.1 kg). Her oral temperature is 97.9 F (36.6 C). Her blood pressure is 140/68 (abnormal) and her pulse is 98. Her respiration is 20 and oxygen  saturation is 93%.   Wt Readings from Last 3 Encounters:  01/01/24 167 lb 12.8 oz (76.1 kg)  12/30/23 164 lb 3.2 oz (74.5 kg)  10/26/23 166 lb (75.3 kg)    Ocular: Sclerae unicteric, pupils equal, round and reactive to light Ear-nose-throat: Oropharynx clear, dentition fair Lymphatic: No cervical or supraclavicular adenopathy Lungs no rales or rhonchi, good excursion bilaterally Heart regular rate and rhythm, no murmur appreciated Abd soft, nontender, positive bowel sounds MSK no focal spinal tenderness, no joint edema Neuro: non-focal, well-oriented, appropriate affect Breasts: Deferred   Lab Results  Component Value Date   WBC 6.3 01/01/2024   HGB 10.8 (L) 01/01/2024   HCT 35.5 (L) 01/01/2024   MCV 99.2 01/01/2024   PLT 156 01/01/2024   Lab Results  Component Value Date   FERRITIN 609 (H) 10/26/2023   IRON  96 10/26/2023   TIBC 244 (L) 10/26/2023   UIBC 148 10/26/2023   IRONPCTSAT 39 (H) 10/26/2023   Lab Results  Component Value Date   RETICCTPCT 0.7 01/01/2024   RBC 3.58 (L) 01/01/2024   No results found for: KPAFRELGTCHN, LAMBDASER, KAPLAMBRATIO No results found for: IGGSERUM, IGA, IGMSERUM No results found for: STEPHANY CARLOTA BENSON MARKEL EARLA JOANNIE DOC VICK, SPEI   Chemistry      Component Value Date/Time   NA 141 12/07/2023 1411   NA 142 09/24/2022 1118   K 4.8 12/07/2023 1411   CL 101 12/07/2023 1411   CO2 30 12/07/2023 1411   BUN 41 (H) 12/07/2023 1411   BUN 52 (H) 09/24/2022 1118   CREATININE 1.73 (H) 12/07/2023 1411   CREATININE 1.36 (H) 08/23/2013 1134      Component  Value Date/Time   CALCIUM 9.8 12/07/2023 1411   ALKPHOS 49 12/07/2023 1411   AST 18 12/07/2023 1411   ALT 9 12/07/2023 1411   BILITOT 0.5 12/07/2023 1411       Impression and Plan: [name] is a very pleasant 83 yo caucasian female with multifactorial anemia.  ESA given for Hgb 10.8.  Iron  studies pending. We will replace if needed.  Lab and injection every 3 weeks, follow-up in 9 weeks.    Lauraine Pepper, NP 12/12/20251:27 PM     [1]  Allergies Allergen Reactions   Montelukast  Sodium Palpitations and Other (See Comments)    REACTION: HEART PALPITATIONS, CHEST PAIN.   Sulfa Antibiotics Other (See Comments)    CHEST  PAIN   Sulfonamide Derivatives Other (See Comments)    CHEST PAIN   Ace Inhibitors Other (See Comments)    PT. STATED UNKNOWN REACTION   Indomethacin Other (See Comments)    PT. STATED UNKNOWN REACTION

## 2024-01-09 ENCOUNTER — Other Ambulatory Visit: Payer: Self-pay | Admitting: Cardiology

## 2024-01-22 ENCOUNTER — Inpatient Hospital Stay

## 2024-01-25 ENCOUNTER — Ambulatory Visit (INDEPENDENT_AMBULATORY_CARE_PROVIDER_SITE_OTHER): Admitting: *Deleted

## 2024-01-25 ENCOUNTER — Telehealth: Payer: Self-pay | Admitting: *Deleted

## 2024-01-25 VITALS — BP 137/82 | Ht 62.0 in | Wt 164.0 lb

## 2024-01-25 DIAGNOSIS — M810 Age-related osteoporosis without current pathological fracture: Secondary | ICD-10-CM

## 2024-01-25 DIAGNOSIS — Z0000 Encounter for general adult medical examination without abnormal findings: Secondary | ICD-10-CM | POA: Diagnosis not present

## 2024-01-25 NOTE — Telephone Encounter (Signed)
 Pt had AWV today. She reports LLQ abd pain x 2 weeks. She denies urinary symptoms, no changes in bowel habits (except some n/v/d over the weekend that has resolved), no fever. Pt wants to wait and see you for this so I gave her the first available for Monday, 02/01/24. Please advise if any other recommendations.

## 2024-01-25 NOTE — Patient Instructions (Addendum)
 [name],  Thank you for taking the time for your Medicare Wellness Visit. I appreciate your continued commitment to your health goals. Please review the care plan we discussed, and feel free to reach out if I can assist you further.  Please note that Annual Wellness Visits do not include a physical exam. Some assessments may be limited, especially if the visit was conducted virtually. If needed, we may recommend an in-person follow-up with your provider.  Ongoing Care Seeing your primary care provider every 3 to 6 months helps us  monitor your health and provide consistent, personalized care.   Waddell Mon, NP: 02/01/24 1pm,  03/30/24 1pm Medicare AWV:  01/25/25 1pm, telephone  Referrals If a referral was made during today's visit and you haven't received any updates within two weeks, please contact the referred provider directly to check on the status.  Bone density ([facility]) due after 11/02/24:  [id number]. Please call to schedule.  Recommended Screenings: You will need to get the following vaccines at your local pharmacy: Flu  Health Maintenance  Topic Date Due   Flu Shot  08/21/2023   Medicare Annual Wellness Visit  03/22/2024   COVID-19 Vaccine (4 - 2025-26 season) 12/21/2024*   Complete foot exam   03/22/2024   Hemoglobin A1C  06/29/2024   Eye exam for diabetics  09/14/2024   Yearly kidney health urinalysis for diabetes  10/01/2024   Yearly kidney function blood test for diabetes  12/31/2024   DTaP/Tdap/Td vaccine (3 - Tdap) 07/26/2030   Pneumococcal Vaccine for age over 56  Completed   Osteoporosis screening with Bone Density Scan  Completed   Zoster (Shingles) Vaccine  Completed   Meningitis B Vaccine  Aged Out   Breast Cancer Screening  Discontinued   Colon Cancer Screening  Discontinued   Hepatitis C Screening  Discontinued  *Topic was postponed. The date shown is not the original due date.       01/01/2024    1:18 PM  Advanced Directives  Does  Patient Have a Medical Advance Directive? Yes  Type of Estate Agent of [place];Living will  Does patient want to make changes to medical advance directive? No - Patient declined  Copy of Healthcare Power of Attorney in Chart? No - copy requested  Would patient like information on creating a medical advance directive? No - Patient declined   Bring a copy of your health care power of attorney and living will to the office to be added to your chart at your convenience. You can mail a copy to [facility] [address] [place], [place] [address] or email to [email or web address]   Vision: Annual vision screenings are recommended for early detection of glaucoma, cataracts, and diabetic retinopathy. These exams can also reveal signs of chronic conditions such as diabetes and high blood pressure.  Dental: Annual dental screenings help detect early signs of oral cancer, gum disease, and other conditions linked to overall health, including heart disease and diabetes.  Please see the attached documents for additional preventive care recommendations.

## 2024-01-25 NOTE — Progress Notes (Signed)
 "  Chief Complaint  Patient presents with   Medicare Wellness     Subjective:   ALAISHA Byrd is a 84 y.o. female who presents for a Medicare Annual Wellness Visit.  Visit info / Clinical Intake: Medicare Wellness Visit Type:: Subsequent Annual Wellness Visit Persons participating in visit and providing information:: patient Medicare Wellness Visit Mode:: Telephone If telephone:: video declined Since this visit was completed virtually, some vitals may be partially provided or unavailable. Missing vitals are due to the limitations of the virtual format.: Documented vitals are patient reported If Telephone or Video please confirm:: I connected with patient using audio/video enable telemedicine. I verified patient identity with two identifiers, discussed telehealth limitations, and patient agreed to proceed. Patient Location:: home Provider Location:: office Interpreter Needed?: No Pre-visit prep was completed: yes AWV questionnaire completed by patient prior to visit?: yes Date:: 01/20/24 Living arrangements:: (!) (Patient-Rptd) lives alone Patient's Overall Health Status Rating: (!) (Patient-Rptd) poor Typical amount of pain: (!) (Patient-Rptd) a lot Does pain affect daily life?: (!) (Patient-Rptd) yes Are you currently prescribed opioids?: no  Dietary Habits and Nutritional Risks How many meals a day?: (Patient-Rptd) 2 Eats fruit and vegetables daily?: (Patient-Rptd) yes Most meals are obtained by: (Patient-Rptd) preparing own meals; having others provide food In the last 2 weeks, have you had any of the following?: (!) nausea, vomiting, diarrhea (just over the weekend and now resolved) Diabetic:: (!) yes Any non-healing wounds?: no How often do you check your BS?: 1 Would you like to be referred to a Nutritionist or for Diabetic Management? : no  Functional Status Activities of Daily Living (to include ambulation/medication): (Patient-Rptd) Independent Ambulation:  Independent with device- listed below Home Assistive Devices/Equipment: Walker (specify Type) (rollator) Medication Administration: (Patient-Rptd) Independent Home Management (perform basic housework or laundry): (Patient-Rptd) Independent Manage your own finances?: (Patient-Rptd) yes Primary transportation is: (Patient-Rptd) driving; family / friends Concerns about vision?: no *vision screening is required for WTM* (up to date with [facility]) Concerns about hearing?: no Uses hearing aids?: (!) yes (doesn't wear often due to glasses and oxygen  tubing)  Fall Screening Falls in the past year?: 1 (PCP is aware) Number of falls in past year: (Patient-Rptd) 1 Was there an injury with Fall?: (Patient-Rptd) 1 Fall Risk Category Calculator: (Patient-Rptd) 3 Patient Fall Risk Level: (Patient-Rptd) High Fall Risk  Fall Risk Patient at Risk for Falls Due to: Impaired mobility (Uses walker for assistance, has oxygen ) Fall risk Follow up: Falls evaluation completed  Home and Transportation Safety: All rugs have non-skid backing?: (Patient-Rptd) yes All stairs or steps have railings?: (Patient-Rptd) N/A, no stairs Grab bars in the bathtub or shower?: (Patient-Rptd) yes Have non-skid surface in bathtub or shower?: (Patient-Rptd) yes Good home lighting?: (Patient-Rptd) yes Regular seat belt use?: (Patient-Rptd) yes Hospital stays in the last year:: (Patient-Rptd) no  Cognitive Assessment Difficulty concentrating, remembering, or making decisions? : yes (forgets what she goes into a room for but will remember after thinking for a bit x 6 month) Will 6CIT or Mini Cog be Completed: yes What year is it?: 0 points What month is it?: 0 points Give patient an address phrase to remember (5 components): [address], [place] About what time is it?: 0 points Count backwards from 20 to 1: 0 points Say the months of the year in reverse: 0 points Repeat the address phrase from  earlier: 2 points 6 CIT Score: 2 points  Advance Directives (For Healthcare) Does Patient Have a Medical Advance  Directive?: Yes Does patient want to make changes to medical advance directive?: No - Patient declined Type of Advance Directive: Healthcare Power of [place]; Living will Copy of Healthcare Power of Attorney in Chart?: No - copy requested Copy of Living Will in Chart?: No - copy requested Would patient like information on creating a medical advance directive?: No - Patient declined  Reviewed/Updated  Reviewed/Updated: Reviewed All (Medical, Surgical, Family, Medications, Allergies, Care Teams, Patient Goals)    Allergies (verified) Montelukast  sodium, Sulfa antibiotics, Sulfonamide derivatives, Ace inhibitors, and Indomethacin   Current Medications (verified) Outpatient Encounter Medications as of 01/25/2024  Medication Sig   ACCU-CHEK GUIDE TEST test strip TEST BLOOD SUGAR EVERY DAY AND AS NEEDED   Accu-Chek Softclix Lancets lancets TEST BLOOD SUGAR EVERY DAY AND AS NEEDED   albuterol  (VENTOLIN  HFA) 108 (90 Base) MCG/ACT inhaler Inhale 2 puffs into the lungs every 4 (four) hours as needed for wheezing or shortness of breath.   Alcohol  Swabs (DROPSAFE ALCOHOL  PREP) 70 % PADS USE AS DIRECTED  BEFORE  CHECKING BLOOD SUGAR   allopurinol  (ZYLOPRIM ) 100 MG tablet TAKE 2 TABLETS ONE TIME DAILY   b complex vitamins tablet Take 1 tablet by mouth daily.   Blood Glucose Calibration (ACCU-CHEK AVIVA) SOLN USE AS DIRECTED WITH GLUCOMETER   Blood Glucose Monitoring Suppl (ACCU-CHEK GUIDE) w/Device KIT USE AS DIRECTED TO CHECK BLOOD SUGAR   denosumab  (PROLIA ) 60 MG/ML SOSY injection To be given in office.  Pt has appt 08/15/22.  Dx code: M81.0   fluticasone  (FLONASE ) 50 MCG/ACT nasal spray USE 2 SPRAYS IN BOTH NOSTRILS DAILY   furosemide  (LASIX ) 40 MG tablet TAKE 1 TABLET DAILY , TAKE SECOND TABLET DAILY AS NEEDED FOR INCREASED EDEMA OR WEIGHT GAIN OVER 3 LBS/24 HRS    ipratropium-albuterol  (DUONEB) 0.5-2.5 (3) MG/3ML SOLN Take 3 mLs by nebulization every 4 (four) hours as needed.   loratadine  (CLARITIN ) 10 MG tablet Take 1 tablet (10 mg total) by mouth at bedtime.   metoprolol  succinate (TOPROL -XL) 25 MG 24 hr tablet Take 12.5 mg by mouth daily.   OXYGEN  Inhale 2 L into the lungs continuous.    pantoprazole  (PROTONIX ) 40 MG tablet TAKE 1 TABLET EVERY DAY   polyethylene glycol (MIRALAX  / GLYCOLAX ) 17 g packet Take 17 g by mouth daily.   pravastatin  (PRAVACHOL ) 40 MG tablet TAKE 1 TABLET EVERY DAY   Probiotic Product (PROBIOTIC PO) Take 1 tablet by mouth daily.    sertraline  (ZOLOFT ) 50 MG tablet Take 1 tablet (50 mg total) by mouth daily.   SYMBICORT  160-4.5 MCG/ACT inhaler INHALE 2 PUFFS TWICE DAILY IN THE MORNING AND AT BEDTIME   traMADol  (ULTRAM ) 50 MG tablet Take 1 tablet (50 mg total) by mouth 3 (three) times daily as needed. Appt required for refills.   vitamin B-12 (CYANOCOBALAMIN ) 1000 MCG tablet Take 1,000 mcg by mouth daily.    Facility-Administered Encounter Medications as of 01/25/2024  Medication   [START ON 02/28/2024] denosumab  (PROLIA ) injection 60 mg    History: Past Medical History:  Diagnosis Date   Abnormal glucose tolerance test    Abnormal TSH 09/22/2016   ACE-inhibitor cough    Anemia 03/12/2014   Arthritis    Asthma    PFT 02/06/09 FEV1 1.41 (65%), FVC 1.92 (64), FEV1% 74, TLC 3.47 (71%), DLCO 48%, +BD   Atypical chest pain    s/p cath, Normal coronaries, Non ST elevation myocardial infarction, Rt groin pseudoaneurysm   Bacterial vaginosis 03/12/2014   Cellulitis 06/22/2016  Chronic kidney disease (CKD), stage III (moderate) (HCC) 08/04/2016   COPD (chronic obstructive pulmonary disease) (HCC)    Depression    Diastolic heart failure (HCC) 04/07/2016   DVT (deep venous thrombosis) (HCC) 1987   Eustachian tube dysfunction 01/29/2014   GERD (gastroesophageal reflux disease)    Gout    Hypercalcemia 10/15/2014    Hyperlipidemia    Hypertension    Hypoxia 10/15/2014   Intracranial hemorrhage (HCC) 11/04/2017   Macular degeneration 04/10/2015   Mitral stenosis    Moderate by echo 09/2022 with mean mitral valve gradient 7 mmHg   Osteopenia 12/29/2006   Qualifier: Diagnosis of  By: Georgian ROSALEA CHARM [place]    Pneumonia    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   Renal insufficiency 09/22/2016   Subarachnoid hemorrhage (HCC) 11/05/2017   Unspecified constipation 06/05/2013   Vitamin D  deficiency 01/01/2015   Past Surgical History:  Procedure Laterality Date   APPENDECTOMY  1951   TONSILLECTOMY  1950   TUBAL LIGATION  1968   Family History  Problem Relation Age of Onset   Asthma Sister    Hypertension Sister    Hyperlipidemia Sister    Uterine cancer Sister    Coronary artery disease Brother        x2   Arthritis Brother    Lung cancer Brother        smoker   Hypertension Sister    Arthritis Sister    Hyperlipidemia Sister    Emphysema Sister    COPD Sister        smoker   Heart disease Sister    Hyperlipidemia Sister    Hypertension Sister    Arthritis Sister    Emphysema Brother    Heart disease Brother         smoker   Heart attack Brother    Mental illness Father    Suicidality Father    Heart disease Mother    Hyperlipidemia Mother    Heart attack Mother    Epilepsy Daughter    Hypertension Daughter    Obesity Daughter    COPD Brother        smoker   Lung cancer Brother    Coronary artery disease Other    Colon polyps Sister    Social History   Occupational History   Occupation: Retired    Associate Professor: [employer]  Tobacco Use   Smoking status: Never    Passive exposure: Past   Smokeless tobacco: Never  Vaping Use   Vaping status: Never Used  Substance and Sexual Activity   Alcohol  use: No   Drug use: No   Sexual activity: Never    Comment: lives alone, no dietary restrictions   Tobacco Counseling Counseling given: Not Answered  SDOH Screenings   Food Insecurity:  No Food Insecurity (01/25/2024)  Housing: Low Risk (01/25/2024)  Transportation Needs: No Transportation Needs (01/25/2024)  Utilities: Not At Risk (01/25/2024)  Alcohol  Screen: Low Risk (10/30/2022)  Depression (PHQ2-9): Medium Risk (01/25/2024)  Financial Resource Strain: Low Risk (12/29/2023)  Physical Activity: Inactive (01/25/2024)  Social Connections: Socially Isolated (01/25/2024)  Stress: No Stress Concern Present (01/25/2024)  Tobacco Use: Low Risk (01/25/2024)  Health Literacy: Adequate Health Literacy (10/30/2022)   See flowsheets for full screening details  Depression Screen PHQ 2 & 9 Depression Scale- Over the past 2 weeks, how often have you been bothered by any of the following problems? Little interest or pleasure in doing things: 2 Feeling down, depressed, or hopeless (PHQ Adolescent also includes...irritable):  0 PHQ-2 Total Score: 2 Trouble falling or staying asleep, or sleeping too much: 1 Feeling tired or having little energy: 3 Poor appetite or overeating (PHQ Adolescent also includes...weight loss): 0 Feeling bad about yourself - or that you are a failure or have let yourself or your family down: 0 Trouble concentrating on things, such as reading the newspaper or watching television (PHQ Adolescent also includes...like school work): 0 Moving or speaking so slowly that other people could have noticed. Or the opposite - being so fidgety or restless that you have been moving around a lot more than usual: 0 Thoughts that you would be better off dead, or of hurting yourself in some way: 0 PHQ-9 Total Score: 6 If you checked off any problems, how difficult have these problems made it for you to do your work, take care of things at home, or get along with other people?: Somewhat difficult  Depression Treatment Depression Interventions/Treatment : Currently on Treatment     Goals Addressed               This Visit's Progress     Patient Stated (pt-stated)   On track     Eat more  fruits and vegetables and exercise more             Objective:    Today's Vitals   01/25/24 1303  BP: 137/82  Weight: 164 lb (74.4 kg)  Height: 5' 2 (1.575 m)   Body mass index is 30 kg/m.  Hearing/Vision screen No results found. Immunizations and Health Maintenance Health Maintenance  Topic Date Due   Influenza Vaccine  08/21/2023   FOOT EXAM  03/22/2024   HEMOGLOBIN A1C  06/29/2024   OPHTHALMOLOGY EXAM  09/14/2024   Diabetic kidney evaluation - Urine ACR  10/01/2024   Diabetic kidney evaluation - eGFR measurement  12/31/2024   Medicare Annual Wellness (AWV)  01/24/2025   DTaP/Tdap/Td (3 - Tdap) 07/26/2030   Pneumococcal Vaccine: 50+ Years  Completed   Bone Density Scan  Completed   Zoster Vaccines- Shingrix   Completed   Meningococcal B Vaccine  Aged Out   Mammogram  Discontinued   Colonoscopy  Discontinued   COVID-19 Vaccine  Discontinued   Hepatitis C Screening  Discontinued        Assessment/Plan:  This is a routine wellness examination for [name].  Patient Care Team: Almarie Waddell NOVAK, NP as PCP - General (Family Medicine) Shlomo Wilbert SAUNDERS, MD as PCP - Cardiology (Cardiology) Parrett, Madelin RAMAN, NP as Nurse Practitioner (Pulmonary Disease) Rayburn Pac, MD as Consulting Physician (Nephrology) Brinda Elsie BRAVO, OD (Optometry) Darlean Ozell NOVAK, MD as Consulting Physician (Pulmonary Disease) Timmy Maude SAUNDERS, MD as Consulting Physician (Oncology)  I have personally reviewed and noted the following in the patients chart:   Medical and social history Use of alcohol , tobacco or illicit drugs  Current medications and supplements including opioid prescriptions. Functional ability and status Nutritional status Physical activity Advanced directives List of other physicians Hospitalizations, surgeries, and ER visits in previous 12 months Vitals Screenings to include cognitive, depression, and falls Referrals and appointments  Orders Placed This Encounter   Procedures   DG Bone Density    Standing Status:   Future    Expected Date:   11/03/2024    Expiration Date:   02/01/2025    Reason for Exam (SYMPTOM  OR DIAGNOSIS REQUIRED):   osteoporosis    Preferred imaging location?:   [facility]   In addition, I have reviewed  and discussed with patient certain preventive protocols, quality metrics, and best practice recommendations. A written personalized care plan for preventive services as well as general preventive health recommendations were provided to patient.   Lolita Libra, CMA   01/25/2024   Return in 1 year (on 01/24/2025).  After Visit Summary: (MyChart) Due to this being a telephonic visit, the after visit summary with patients personalized plan was offered to patient via MyChart   Nurse Notes: HM Addressed: Vaccines Due: Flu DEXA ordered Pt declines mammograms in the future.  "

## 2024-01-26 NOTE — Telephone Encounter (Signed)
 That's fine. Come in sooner if symptoms worsen. Thank you.

## 2024-02-01 ENCOUNTER — Ambulatory Visit: Admitting: Family Medicine

## 2024-02-01 DIAGNOSIS — R1032 Left lower quadrant pain: Secondary | ICD-10-CM

## 2024-02-01 NOTE — Progress Notes (Signed)
 "  Acute Office Visit  Subjective:  Patient ID: [name], female    DOB: 1940-02-11  Age: 84 y.o. MRN: [id number]  CC: No chief complaint on file.     HPI [name] is here for left lower abdominal pain.   Past Medical History:  Diagnosis Date   Abnormal glucose tolerance test    Abnormal TSH 09/22/2016   ACE-inhibitor cough    Anemia 03/12/2014   Arthritis    Asthma    PFT 02/06/09 FEV1 1.41 (65%), FVC 1.92 (64), FEV1% 74, TLC 3.47 (71%), DLCO 48%, +BD   Atypical chest pain    s/p cath, Normal coronaries, Non ST elevation myocardial infarction, Rt groin pseudoaneurysm   Bacterial vaginosis 03/12/2014   Cellulitis 06/22/2016   Chronic kidney disease (CKD), stage III (moderate) (HCC) 08/04/2016   COPD (chronic obstructive pulmonary disease) (HCC)    Depression    Diastolic heart failure (HCC) 04/07/2016   DVT (deep venous thrombosis) (HCC) 1987   Eustachian tube dysfunction 01/29/2014   GERD (gastroesophageal reflux disease)    Gout    Hypercalcemia 10/15/2014   Hyperlipidemia    Hypertension    Hypoxia 10/15/2014   Intracranial hemorrhage (HCC) 11/04/2017   Macular degeneration 04/10/2015   Mitral stenosis    Moderate by echo 09/2022 with mean mitral valve gradient 7 mmHg   Osteopenia 12/29/2006   Qualifier: Diagnosis of  By: Georgian DO, CHARM [place]    Pneumonia    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   Renal insufficiency 09/22/2016   Subarachnoid hemorrhage (HCC) 11/05/2017   Unspecified constipation 06/05/2013   Vitamin D  deficiency 01/01/2015    Past Surgical History:  Procedure Laterality Date   APPENDECTOMY  1951   TONSILLECTOMY  1950   TUBAL LIGATION  1968    Family History  Problem Relation Age of Onset   Asthma Sister    Hypertension Sister    Hyperlipidemia Sister    Uterine cancer Sister    Coronary artery disease Brother        x2   Arthritis Brother    Lung cancer Brother        smoker   Hypertension Sister    Arthritis Sister     Hyperlipidemia Sister    Emphysema Sister    COPD Sister        smoker   Heart disease Sister    Hyperlipidemia Sister    Hypertension Sister    Arthritis Sister    Emphysema Brother    Heart disease Brother         smoker   Heart attack Brother    Mental illness Father    Suicidality Father    Heart disease Mother    Hyperlipidemia Mother    Heart attack Mother    Epilepsy Daughter    Hypertension Daughter    Obesity Daughter    COPD Brother        smoker   Lung cancer Brother    Coronary artery disease Other    Colon polyps Sister     Social History   Socioeconomic History   Marital status: Widowed    Spouse name: Not on file   Number of children: 1   Years of education: Not on file   Highest education level: 12th grade  Occupational History   Occupation: Retired    Associate Professor: [employer]  Tobacco Use   Smoking status: Never    Passive exposure: Past   Smokeless tobacco: Never  Vaping Use  Vaping status: Never Used  Substance and Sexual Activity   Alcohol  use: No   Drug use: No   Sexual activity: Never    Comment: lives alone, no dietary restrictions  Other Topics Concern   Not on file  Social History Narrative   Not on file   Social Drivers of Health   Tobacco Use: Low Risk (01/25/2024)   Patient History    Smoking Tobacco Use: Never    Smokeless Tobacco Use: Never    Passive Exposure: Past  Financial Resource Strain: Low Risk (12/29/2023)   Overall Financial Resource Strain (CARDIA)    Difficulty of Paying Living Expenses: Not hard at all  Food Insecurity: No Food Insecurity (01/25/2024)   Epic    Worried About Radiation Protection Practitioner of Food in the Last Year: Never true    Ran Out of Food in the Last Year: Never true  Transportation Needs: No Transportation Needs (01/25/2024)   Epic    Lack of Transportation (Medical): No    Lack of Transportation (Non-Medical): No  Physical Activity: Inactive (01/25/2024)   Exercise Vital Sign    Days of Exercise per Week: 0  days    Minutes of Exercise per Session: 30 min  Stress: No Stress Concern Present (01/25/2024)   [employer] of Occupational Health - Occupational Stress Questionnaire    Feeling of Stress: Only a little  Social Connections: Socially Isolated (01/25/2024)   Social Connection and Isolation Panel    Frequency of Communication with Friends and Family: More than three times a week    Frequency of Social Gatherings with Friends and Family: Once a week    Attends Religious Services: Patient declined    Database Administrator or Organizations: No    Attends Engineer, Structural: Not on file    Marital Status: Widowed  Intimate Partner Violence: Not At Risk (01/25/2024)   Epic    Fear of Current or Ex-Partner: No    Emotionally Abused: No    Physically Abused: No    Sexually Abused: No  Depression (PHQ2-9): Medium Risk (01/25/2024)   Depression (PHQ2-9)    PHQ-2 Score: 6  Alcohol  Screen: Low Risk (10/30/2022)   Alcohol  Screen    Last Alcohol  Screening Score (AUDIT): 0  Housing: Low Risk (01/25/2024)   Epic    Unable to Pay for Housing in the Last Year: No    Number of Times Moved in the Last Year: 0    Homeless in the Last Year: No  Utilities: Not At Risk (01/25/2024)   Epic    Threatened with loss of utilities: No  Health Literacy: Adequate Health Literacy (10/30/2022)   B1300 Health Literacy    Frequency of need for help with medical instructions: Never    ROS All ROS negative except what is listed in the HPI.   Objective:   Today's Vitals: There were no vitals taken for this visit.  Physical Exam  Assessment & Plan:   Problem List Items Addressed This Visit   None     Follow-up: No follow-ups on file.   [name], DNP, FNP-C  I,Emily Lagle,acting as a neurosurgeon for [name], NP.,have documented all relevant documentation on the behalf of [name], NP.   I, [name], NP, have reviewed all documentation for this visit. The documentation on  02/04/2024 for the exam, diagnosis, procedures, and orders are all accurate and complete. "

## 2024-02-01 NOTE — Progress Notes (Incomplete)
 "  Acute Office Visit  Subjective:  Patient ID: [name], female    DOB: Nov 15, 1940  Age: 84 y.o. MRN: [id number]  CC: No chief complaint on file.     HPI [name] is here for left lower abdominal pain.      Past Medical History:  Diagnosis Date   Abnormal glucose tolerance test    Abnormal TSH 09/22/2016   ACE-inhibitor cough    Anemia 03/12/2014   Arthritis    Asthma    PFT 02/06/09 FEV1 1.41 (65%), FVC 1.92 (64), FEV1% 74, TLC 3.47 (71%), DLCO 48%, +BD   Atypical chest pain    s/p cath, Normal coronaries, Non ST elevation myocardial infarction, Rt groin pseudoaneurysm   Bacterial vaginosis 03/12/2014   Cellulitis 06/22/2016   Chronic kidney disease (CKD), stage III (moderate) (HCC) 08/04/2016   COPD (chronic obstructive pulmonary disease) (HCC)    Depression    Diastolic heart failure (HCC) 04/07/2016   DVT (deep venous thrombosis) (HCC) 1987   Eustachian tube dysfunction 01/29/2014   GERD (gastroesophageal reflux disease)    Gout    Hypercalcemia 10/15/2014   Hyperlipidemia    Hypertension    Hypoxia 10/15/2014   Intracranial hemorrhage (HCC) 11/04/2017   Macular degeneration 04/10/2015   Mitral stenosis    Moderate by echo 09/2022 with mean mitral valve gradient 7 mmHg   Osteopenia 12/29/2006   Qualifier: Diagnosis of  By: Georgian DO, CHARM [place]    Pneumonia    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   Renal insufficiency 09/22/2016   Subarachnoid hemorrhage (HCC) 11/05/2017   Unspecified constipation 06/05/2013   Vitamin D  deficiency 01/01/2015    Past Surgical History:  Procedure Laterality Date   APPENDECTOMY  1951   TONSILLECTOMY  1950   TUBAL LIGATION  1968    Family History  Problem Relation Age of Onset   Asthma Sister    Hypertension Sister    Hyperlipidemia Sister    Uterine cancer Sister    Coronary artery disease Brother        x2   Arthritis Brother    Lung cancer Brother        smoker   Hypertension Sister    Arthritis Sister     Hyperlipidemia Sister    Emphysema Sister    COPD Sister        smoker   Heart disease Sister    Hyperlipidemia Sister    Hypertension Sister    Arthritis Sister    Emphysema Brother    Heart disease Brother         smoker   Heart attack Brother    Mental illness Father    Suicidality Father    Heart disease Mother    Hyperlipidemia Mother    Heart attack Mother    Epilepsy Daughter    Hypertension Daughter    Obesity Daughter    COPD Brother        smoker   Lung cancer Brother    Coronary artery disease Other    Colon polyps Sister     Social History   Socioeconomic History   Marital status: Widowed    Spouse name: Not on file   Number of children: 1   Years of education: Not on file   Highest education level: 12th grade  Occupational History   Occupation: Retired    Associate Professor: [employer]  Tobacco Use   Smoking status: Never    Passive exposure: Past   Smokeless tobacco: Never  Vaping Use   Vaping status: Never Used  Substance and Sexual Activity   Alcohol  use: No   Drug use: No   Sexual activity: Never    Comment: lives alone, no dietary restrictions  Other Topics Concern   Not on file  Social History Narrative   Not on file   Social Drivers of Health   Tobacco Use: Low Risk (01/25/2024)   Patient History    Smoking Tobacco Use: Never    Smokeless Tobacco Use: Never    Passive Exposure: Past  Financial Resource Strain: Low Risk (12/29/2023)   Overall Financial Resource Strain (CARDIA)    Difficulty of Paying Living Expenses: Not hard at all  Food Insecurity: No Food Insecurity (01/25/2024)   Epic    Worried About Radiation Protection Practitioner of Food in the Last Year: Never true    Ran Out of Food in the Last Year: Never true  Transportation Needs: No Transportation Needs (01/25/2024)   Epic    Lack of Transportation (Medical): No    Lack of Transportation (Non-Medical): No  Physical Activity: Inactive (01/25/2024)   Exercise Vital Sign    Days of Exercise per Week: 0  days    Minutes of Exercise per Session: 30 min  Stress: No Stress Concern Present (01/25/2024)   [employer] of Occupational Health - Occupational Stress Questionnaire    Feeling of Stress: Only a little  Social Connections: Socially Isolated (01/25/2024)   Social Connection and Isolation Panel    Frequency of Communication with Friends and Family: More than three times a week    Frequency of Social Gatherings with Friends and Family: Once a week    Attends Religious Services: Patient declined    Database Administrator or Organizations: No    Attends Engineer, Structural: Not on file    Marital Status: Widowed  Intimate Partner Violence: Not At Risk (01/25/2024)   Epic    Fear of Current or Ex-Partner: No    Emotionally Abused: No    Physically Abused: No    Sexually Abused: No  Depression (PHQ2-9): Medium Risk (01/25/2024)   Depression (PHQ2-9)    PHQ-2 Score: 6  Alcohol  Screen: Low Risk (10/30/2022)   Alcohol  Screen    Last Alcohol  Screening Score (AUDIT): 0  Housing: Low Risk (01/25/2024)   Epic    Unable to Pay for Housing in the Last Year: No    Number of Times Moved in the Last Year: 0    Homeless in the Last Year: No  Utilities: Not At Risk (01/25/2024)   Epic    Threatened with loss of utilities: No  Health Literacy: Adequate Health Literacy (10/30/2022)   B1300 Health Literacy    Frequency of need for help with medical instructions: Never    ROS All ROS negative except what is listed in the HPI.   Objective:   Today's Vitals: There were no vitals taken for this visit.  Physical Exam  Assessment & Plan:   Problem List Items Addressed This Visit   None Visit Diagnoses       LLQ abdominal pain    -  Primary         Follow-up: No follow-ups on file.   Margaret FURY Almarie, DNP, FNP-C  I,Emily Lagle,acting as a neurosurgeon for Margaret KATHEE Almarie, NP.,have documented all relevant documentation on the behalf of Margaret KATHEE Almarie, NP.   I, Margaret KATHEE Almarie, NP, have  reviewed all documentation for this visit. The documentation on  02/01/2024 for the exam, diagnosis, procedures, and orders are all accurate and complete. "

## 2024-02-02 ENCOUNTER — Other Ambulatory Visit (HOSPITAL_COMMUNITY): Payer: Self-pay

## 2024-02-02 ENCOUNTER — Telehealth: Payer: Self-pay

## 2024-02-02 NOTE — Telephone Encounter (Signed)
 Prolia  VOB initiated via [email or web address]  Next Prolia  inj DUE: 03/02/24

## 2024-02-04 ENCOUNTER — Encounter: Payer: Self-pay | Admitting: Family Medicine

## 2024-02-04 ENCOUNTER — Other Ambulatory Visit (HOSPITAL_BASED_OUTPATIENT_CLINIC_OR_DEPARTMENT_OTHER): Payer: Self-pay

## 2024-02-04 ENCOUNTER — Ambulatory Visit: Admitting: Family Medicine

## 2024-02-04 VITALS — BP 126/40 | HR 98 | Ht 62.0 in | Wt 167.0 lb

## 2024-02-04 DIAGNOSIS — I10 Essential (primary) hypertension: Secondary | ICD-10-CM

## 2024-02-04 MED ORDER — FLUZONE HIGH-DOSE 0.5 ML IM SUSY
0.5000 mL | PREFILLED_SYRINGE | Freq: Once | INTRAMUSCULAR | 0 refills | Status: AC
  Filled 2024-02-04: qty 0.5, 1d supply, fill #0

## 2024-02-05 NOTE — Telephone Encounter (Signed)
 SABRA

## 2024-02-05 NOTE — Telephone Encounter (Addendum)
 MEDICAL PA SUBMITTED VIA LATENT. KEY: [id number]

## 2024-02-06 ENCOUNTER — Other Ambulatory Visit: Payer: Self-pay | Admitting: Family Medicine

## 2024-02-06 DIAGNOSIS — M549 Dorsalgia, unspecified: Secondary | ICD-10-CM

## 2024-02-08 ENCOUNTER — Other Ambulatory Visit (HOSPITAL_COMMUNITY): Payer: Self-pay

## 2024-02-08 ENCOUNTER — Telehealth: Payer: Self-pay | Admitting: Cardiology

## 2024-02-08 NOTE — Telephone Encounter (Signed)
 Pt ready for scheduling for PROLIA  on or after : 03/02/24  Option# 1: Buy/Bill (Office supplied medication)  Out-of-pocket cost due at time of clinic visit: $362  Number of injection/visits approved: 2  Primary: [employer] Prolia  co-insurance: 20% Admin fee co-insurance: $10  Secondary: --- Prolia  co-insurance:  Admin fee co-insurance:   Medical Benefit Details: Date Benefits were checked: 02/03/24 Deductible: NO/ Coinsurance: 20%/ Admin Fee: $10  Prior Auth: APPROVED PA# [id number] Expiration Date: 03/02/24-01/19/25  # of doses approved: 2 ----------------------------------------------------------------------- Option# 2- Med Obtained from pharmacy:  Pharmacy benefit: Copay $907.90 (Paid to pharmacy) Admin Fee: $10 (Pay at clinic)  Prior Auth: N/A PA# Expiration Date:   # of doses approved:   If patient wants fill through the pharmacy benefit please send prescription to: [facility], and include estimated need by date in rx notes. Pharmacy will ship medication directly to the office. ----------------------------------------------------------------------- Option# 3- Med obtained from pharmacy and shipped to clinic: MEDICAL BENEFIT VIA SPECIALTY PHARMACY ([facility])  Pharmacy benefit: Copay $0 (Paid to pharmacy) Admin Fee: $10 (Pay at clinic)  Prior Auth: APPROVED PA# [id number] Expiration Date: 03/02/24-01/19/25 # of doses approved: 2 -----------------------------------------------------------------------  Patient NOT eligible for Prolia  Copay Card. Copay Card can make patient's cost as little as $25. Link to apply: [email or web address]://[email or web address].[email or web address]/copay  ** This summary of benefits is an estimation of the patient's out-of-pocket cost. Exact cost may very based on individual plan coverage.

## 2024-02-08 NOTE — Telephone Encounter (Signed)
 Requesting: tramadol  50mg  Contract: 12/30/2023 UDS: 12/30/2023 Last Visit: 02/04/2024 Next Visit: 03/30/2024 Last Refill: 12/30/23 #90 and 0RF    Please Advise

## 2024-02-08 NOTE — Telephone Encounter (Signed)
 Pt c/o BP issue: STAT if pt c/o blurred vision, one-sided weakness or slurred speech.  STAT if BP is GREATER than 180/120 TODAY.  STAT if BP is LESS than 90/60 and SYMPTOMATIC TODAY  1. What is your BP concern? Hypertension   2. Have you taken any BP medication today? Yes   3. What are your last 5 BP readings?1/19: 135/76 , 140/79, 128/78, 146/85, 143/82  4. Are you having any other symptoms (ex. Dizziness, headache, blurred vision, passed out)?   Pt states is supposed to take half of the mill but when BP elevates, she takes a whole... pt would like a c/b regarding this matter.

## 2024-02-09 NOTE — Telephone Encounter (Signed)
 Left message for patient to callback.  Patient has appt with Dr. Shlomo tomorrow 02/10/24.

## 2024-02-10 ENCOUNTER — Ambulatory Visit: Attending: Cardiology | Admitting: Cardiology

## 2024-02-10 ENCOUNTER — Ambulatory Visit: Admitting: Cardiology

## 2024-02-10 ENCOUNTER — Telehealth: Payer: Self-pay | Admitting: *Deleted

## 2024-02-10 ENCOUNTER — Encounter: Payer: Self-pay | Admitting: Cardiology

## 2024-02-10 VITALS — BP 150/74 | HR 104 | Ht 62.0 in | Wt 157.8 lb

## 2024-02-10 DIAGNOSIS — N184 Chronic kidney disease, stage 4 (severe): Secondary | ICD-10-CM

## 2024-02-10 DIAGNOSIS — I739 Peripheral vascular disease, unspecified: Secondary | ICD-10-CM

## 2024-02-10 DIAGNOSIS — I5032 Chronic diastolic (congestive) heart failure: Secondary | ICD-10-CM | POA: Diagnosis not present

## 2024-02-10 DIAGNOSIS — I349 Nonrheumatic mitral valve disorder, unspecified: Secondary | ICD-10-CM | POA: Diagnosis not present

## 2024-02-10 DIAGNOSIS — I10 Essential (primary) hypertension: Secondary | ICD-10-CM

## 2024-02-10 DIAGNOSIS — E782 Mixed hyperlipidemia: Secondary | ICD-10-CM | POA: Diagnosis not present

## 2024-02-10 DIAGNOSIS — R55 Syncope and collapse: Secondary | ICD-10-CM | POA: Diagnosis not present

## 2024-02-10 DIAGNOSIS — I050 Rheumatic mitral stenosis: Secondary | ICD-10-CM

## 2024-02-10 MED ORDER — METOPROLOL SUCCINATE ER 25 MG PO TB24: 25.0000 mg | ORAL_TABLET | Freq: Every day | ORAL | 3 refills | Status: AC

## 2024-02-10 NOTE — Telephone Encounter (Signed)
 Addressed by Dr. Shlomo at [facility] today.

## 2024-02-10 NOTE — Patient Instructions (Addendum)
 Medication Instructions:  Your physician has recommended you make the following change in your medication:   INCREASE: metoprolol  succinate (Toprol  XL) to 25 mg by mouth once daily  *If you need a refill on your cardiac medications before your next appointment, please call your pharmacy*  Lab Work: NONE    Testing/Procedures: Your physician has requested that you have an echocardiogram. Echocardiography is a painless test that uses sound waves to create images of your heart. It provides your doctor with information about the size and shape of your heart and how well your hearts chambers and valves are working. This procedure takes approximately one hour. There are no restrictions for this procedure. Please do NOT wear cologne, perfume, aftershave, or lotions (deodorant is allowed). Please arrive 15 minutes prior to your appointment time.  Please note: We ask at that you not bring children with you during ultrasound (echo/ vascular) testing. Due to room size and safety concerns, children are not allowed in the ultrasound rooms during exams. Our front office staff cannot provide observation of children in our lobby area while testing is being conducted. An adult accompanying a patient to their appointment will only be allowed in the ultrasound room at the discretion of the ultrasound technician under special circumstances. We apologize for any inconvenience.   Follow-Up: At [facility], you and your health needs are our priority.  As part of our continuing mission to provide you with exceptional heart care, our providers are all part of one team.  This team includes your primary Cardiologist (physician) and Advanced Practice Providers or APPs (Physician Assistants and Nurse Practitioners) who all work together to provide you with the care you need, when you need it.  Your next appointment:   6 month(s)  Provider:   Wilbert Bihari, MD      Other Instructions Please monitor your BP  and Heart Rate twice daily for 1 week and send in results for Dr. Bihari to review.    Blood Pressure Record Sheet To take your blood pressure, you will need a blood pressure machine. You can buy a blood pressure machine (blood pressure monitor) at your clinic, drug store, or online. When choosing one, consider: An automatic monitor that has an arm cuff. A cuff that wraps snugly around your upper arm. You should be able to fit only one finger between your arm and the cuff. A device that stores blood pressure reading results. Do not choose a monitor that measures your blood pressure from your wrist or finger. Follow your health care provider's instructions for how to take your blood pressure. To use this form: Take your blood pressure medications every day These measurements should be taken when you have been at rest for at least 10-15 min Take at least 2 readings with each blood pressure check. This makes sure the results are correct. Wait 1-2 minutes between measurements. Write down the results in the spaces on this form. Keep in mind it should always be recorded systolic over diastolic. Both numbers are important.  Repeat this every day for 2-3 weeks, or as told by your health care provider.  Make a follow-up appointment with your health care provider to discuss the results.  Blood Pressure Log Date Medications taken? (Y/N) Blood Pressure Time of Day

## 2024-02-10 NOTE — Progress Notes (Signed)
 " Cardiology Office Note:    Date:  02/10/2024   ID:  [name], DOB 1940-05-30, MRN [id number]  PCP:  Almarie Waddell NOVAK, NP  Cardiologist:  Wilbert Bihari, MD   Referring MD: Almarie Waddell NOVAK, NP   Chief Complaint  Patient presents with   Follow-up    History of Present Illness:    [name] is a 84 y.o. female with a hx of asthma, atypical CP with normal coronary arteries by cath in 2008 in the setting of NSTEMI complicated by right groin pseudoaneurysm, chronic diastolic CHF, GERD, HTN and hyperlipidemia.  She has a history of DVT in the past as well as O2 dependent COPD on 2L [place].  She has chronic DOE/SOB related to COPD and her breathing is stable on 2L [place].  2D echo 10/09/2021 showed EF 50 to 55% with grade 1 diastolic dysfunction, normal RV, mild MR.  She has chronic shortness of breath related to COPD and is on oxygen  at 2L [place] 24/7.  At last office she was complaining of chest pain.  Dobutamine Myoview  was done 10/20/2022 which was low risk with no ischemia.  There was a small fixed apical inferior/lateral defect in the area of extracardiac activity likely resulting in an artifact.  There were no regional wall motion abnormalities to indicate infarct.  2D echo 10/01/2022 showed EF 50 to 55% with mild LVH and G2 DD, normal RV, moderate mitral stenosis with a mean mitral valve gradient of 7 mmHg due to severe mitral annular calcification/degenerative mitral valve  The patient is here today and is doing well.  She continues to have her chronic shortness of breath from COPD and is on oxygen  24/7 but her breathing is stable.  She denies any CP or pressure, PND, orthopnea,dizziness, palpitations or syncope.  She has chronic LE edema which is stable.   Past Medical History:  Diagnosis Date   Abnormal glucose tolerance test    Abnormal TSH 09/22/2016   ACE-inhibitor cough    Anemia 03/12/2014   Arthritis    Asthma    PFT 02/06/09 FEV1 1.41 (65%), FVC 1.92 (64), FEV1% 74, TLC 3.47 (71%), DLCO  48%, +BD   Atypical chest pain    s/p cath, Normal coronaries, Non ST elevation myocardial infarction, Rt groin pseudoaneurysm   Bacterial vaginosis 03/12/2014   Cellulitis 06/22/2016   Chronic kidney disease (CKD), stage III (moderate) (HCC) 08/04/2016   COPD (chronic obstructive pulmonary disease) (HCC)    Depression    Diastolic heart failure (HCC) 04/07/2016   DVT (deep venous thrombosis) (HCC) 1987   Eustachian tube dysfunction 01/29/2014   GERD (gastroesophageal reflux disease)    Gout    Hypercalcemia 10/15/2014   Hyperlipidemia    Hypertension    Hypoxia 10/15/2014   Intracranial hemorrhage (HCC) 11/04/2017   Macular degeneration 04/10/2015   Mitral stenosis    Moderate by echo 09/2022 with mean mitral valve gradient 7 mmHg   Osteopenia 12/29/2006   Qualifier: Diagnosis of  By: Georgian ROSALEA CHARM [place]    Pneumonia    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   Renal insufficiency 09/22/2016   Subarachnoid hemorrhage (HCC) 11/05/2017   Unspecified constipation 06/05/2013   Vitamin D  deficiency 01/01/2015    Past Surgical History:  Procedure Laterality Date   APPENDECTOMY  1951   TONSILLECTOMY  1950   TUBAL LIGATION  1968    Current Medications: No outpatient medications have been marked as taking for the 02/10/24 encounter (Office Visit) with Bihari Wilbert SAUNDERS,  MD.   Current Facility-Administered Medications for the 02/10/24 encounter (Office Visit) with Shlomo Wilbert SAUNDERS, MD  Medication   [START ON 02/28/2024] denosumab  (PROLIA ) injection 60 mg     Allergies:   Montelukast  sodium, Sulfa antibiotics, Sulfonamide derivatives, Ace inhibitors, and Indomethacin   Social History   Socioeconomic History   Marital status: Widowed    Spouse name: Not on file   Number of children: 1   Years of education: Not on file   Highest education level: 12th grade  Occupational History   Occupation: Retired    Associate Professor: [employer]  Tobacco Use   Smoking status: Never    Passive exposure: Past    Smokeless tobacco: Never  Vaping Use   Vaping status: Never Used  Substance and Sexual Activity   Alcohol  use: No   Drug use: No   Sexual activity: Never    Comment: lives alone, no dietary restrictions  Other Topics Concern   Not on file  Social History Narrative   Not on file   Social Drivers of Health   Tobacco Use: Low Risk (02/10/2024)   Patient History    Smoking Tobacco Use: Never    Smokeless Tobacco Use: Never    Passive Exposure: Past  Financial Resource Strain: Low Risk (12/29/2023)   Overall Financial Resource Strain (CARDIA)    Difficulty of Paying Living Expenses: Not hard at all  Food Insecurity: No Food Insecurity (01/25/2024)   Epic    Worried About Radiation Protection Practitioner of Food in the Last Year: Never true    Ran Out of Food in the Last Year: Never true  Transportation Needs: No Transportation Needs (01/25/2024)   Epic    Lack of Transportation (Medical): No    Lack of Transportation (Non-Medical): No  Physical Activity: Inactive (01/25/2024)   Exercise Vital Sign    Days of Exercise per Week: 0 days    Minutes of Exercise per Session: 30 min  Stress: No Stress Concern Present (01/25/2024)   [employer] of Occupational Health - Occupational Stress Questionnaire    Feeling of Stress: Only a little  Social Connections: Socially Isolated (01/25/2024)   Social Connection and Isolation Panel    Frequency of Communication with Friends and Family: More than three times a week    Frequency of Social Gatherings with Friends and Family: Once a week    Attends Religious Services: Patient declined    Database Administrator or Organizations: No    Attends Engineer, Structural: Not on file    Marital Status: Widowed  Depression (PHQ2-9): Medium Risk (01/25/2024)   Depression (PHQ2-9)    PHQ-2 Score: 6  Alcohol  Screen: Low Risk (10/30/2022)   Alcohol  Screen    Last Alcohol  Screening Score (AUDIT): 0  Housing: Low Risk (01/25/2024)   Epic    Unable to Pay for Housing  in the Last Year: No    Number of Times Moved in the Last Year: 0    Homeless in the Last Year: No  Utilities: Not At Risk (01/25/2024)   Epic    Threatened with loss of utilities: No  Health Literacy: Adequate Health Literacy (10/30/2022)   B1300 Health Literacy    Frequency of need for help with medical instructions: Never     Family History: The patient's family history includes Arthritis in her brother, sister, and sister; Asthma in her sister; COPD in her brother and sister; Colon polyps in her sister; Coronary artery disease in her brother and another family  member; Emphysema in her brother and sister; Epilepsy in her daughter; Heart attack in her brother and mother; Heart disease in her brother, mother, and sister; Hyperlipidemia in her mother, sister, sister, and sister; Hypertension in her daughter, sister, sister, and sister; Lung cancer in her brother and brother; Mental illness in her father; Obesity in her daughter; Suicidality in her father; Uterine cancer in her sister.  ROS:   Please see the history of present illness.    Review of Systems  Skin:  Negative for unusual hair distribution.  Allergic/Immunologic: Negative for environmental allergies.    All other systems reviewed and negative.   EKGs/Labs/Other Studies Reviewed:    The following studies were reviewed today:  EKG Interpretation Date/Time:  Wednesday February 10 2024 11:30:03 EST Ventricular Rate:  104 PR Interval:  202 QRS Duration:  84 QT Interval:  352 QTC Calculation: 462 R Axis:   -10  Text Interpretation: Sinus tachycardia with Premature atrial complexes Minimal voltage criteria for LVH, may be normal variant ( R in aVL ) When compared with ECG of 17-Sep-2022 14:29, Premature ventricular complexes are no longer Present Premature atrial complexes are now Present Confirmed by Shlomo Corning [phone]) on 02/10/2024 11:35:24 AM    Recent Labs: 01/01/2024: ALT 8; BUN 36; Creatinine 2.01; Hemoglobin 10.8;  Platelet Count 156; Potassium 4.7; Sodium 141   Recent Lipid Panel    Component Value Date/Time   CHOL 141 12/30/2023 1000   CHOL 151 07/27/2019 1701   TRIG 107.0 12/30/2023 1000   TRIG 122 01/27/2006 1037   HDL 60.70 12/30/2023 1000   HDL 47 07/27/2019 1701   CHOLHDL 2 12/30/2023 1000   VLDL 21.4 12/30/2023 1000   LDLCALC 59 12/30/2023 1000   LDLCALC 75 07/27/2019 1701   LDLDIRECT 73.0 06/16/2017 1543    Physical Exam:    VS:  BP (!) 150/74   Pulse (!) 104   Ht 5' 2 (1.575 m)   Wt 157 lb 12.8 oz (71.6 kg)   SpO2 97%   BMI 28.86 kg/m     Wt Readings from Last 3 Encounters:  02/10/24 157 lb 12.8 oz (71.6 kg)  02/04/24 167 lb (75.8 kg)  01/25/24 164 lb (74.4 kg)    GEN: Well nourished, well developed in no acute distress HEENT: Normal NECK: No JVD; No carotid bruits LYMPHATICS: No lymphadenopathy CARDIAC:RRR, no murmurs, rubs, gallops RESPIRATORY:  Clear to auscultation without rales, wheezing or rhonchi in the back.  Few crackles anteriorly ABDOMEN: Soft, non-tender, non-distended MUSCULOSKELETAL:  1-2+ BLE edema; No deformity  SKIN: Warm and dry NEUROLOGIC:  Alert and oriented x 3 PSYCHIATRIC:  Normal affect  ASSESSMENT:    1. Chronic diastolic (congestive) heart failure (HCC)   2. Chest pain, unspecified type     PLAN:    In order of problems listed above:  Chronic diastolic CHF - She has a shortness of breath related to COPD and is on O2 24/7.  Her shortness of breath is stable.   -Appears euvolemic on exam today -I have personally reviewed and interpreted outside labs performed by patient's PCP which showed SCr  2.0 and K+ 4.7 on 01/01/2024 - Continue Lasix  40 mg daily with as needed refills - increasing Toprol  XL to 25mg  daily for better HR and BP control  HTN -BP borderline controlled on exam today -increase Toprol  XL to 25mg  daily -check BP and HR daily for a week and call with results  Syncope - Denies any CNS or syncope  PAD -Doppler  07/2016 showed greater than 50% bilateral external iliac artery stenosis, 50 to 74% bilateral CFA stenosis, 50 to 74% right distal SFA stenosis with three-vessel runoff bilaterally. -See by Dr. Court and felt that patient's LE pain was not due to arterial insufficiency -Continue statin therapy and walking program  HLD -LDL goal < 70 -I have personally reviewed and interpreted outside labs performed by patient's PCP which showed LDL 59 and HDL 60 on 12/0/25 - Continue pravastatin  40 mg daily with as needed refills  CKD stage 4 -followed by nephrology -renal is managing diureticsSCr 2 and K+ 4.7 on 12/30/23  Mitral stenosis -She has degenerative mitral valve disease with severe mitral annular calcification -2D 10/10/2022 showed moderate mitral stenosis with mean mitral valve gradient 7 mmHg -Will repeat 2D echo to make sure this is stable     Followup with me in 6 months  Medication Adjustments/Labs and Tests Ordered: Current medicines are reviewed at length with the patient today.  Concerns regarding medicines are outlined above.  Orders Placed This Encounter  Procedures   EKG 12-Lead   No orders of the defined types were placed in this encounter.   Signed, Wilbert Bihari, MD  02/10/2024 11:37 AM    [facility] "

## 2024-02-10 NOTE — Telephone Encounter (Signed)
 Copied from CRM #[id number]. Topic: Clinical - Medication Refill >> Feb 08, 2024  4:52 PM Dedra B wrote: Medication:  ipratropium-albuterol  (DUONEB) 0.5-2.5 (3) MG/3ML SOLN  Has the patient contacted their pharmacy? Yes, needs new prescription  This is the patient's preferred pharmacy:  [facility] [address] ([address] [place] - [address] [place]) [place] [address] Phone: [phone] Fax: [phone]  Is this the correct pharmacy for this prescription? Yes  Has the prescription been filled recently? No  Is the patient out of the medication? No, few days left  Has the patient been seen for an appointment in the last year OR does the patient have an upcoming appointment? No  Can we respond through MyChart? Yes  Agent: Please be advised that Rx refills may take up to 3 business days. We ask that you follow-up with your pharmacy.  -----------------------------------------------------------------------------------------------------------------------------------------------  Patient has not been seen since 11/19/2022.  She will need to schedule an OV before we can refill her medications.  I do not see that Dr. Darlean prescribed Duoneb for her, she was prescribed Albuterol .  When she returns call, please schedule an OV for patient.  Thank you.

## 2024-02-11 MED ORDER — IPRATROPIUM-ALBUTEROL 0.5-2.5 (3) MG/3ML IN SOLN: 3.0000 mL | RESPIRATORY_TRACT | 1 refills | Status: AC | PRN

## 2024-02-11 NOTE — Telephone Encounter (Signed)
 Courtesy refill sent,will need appointment for further refills

## 2024-02-12 ENCOUNTER — Inpatient Hospital Stay: Attending: Family

## 2024-02-12 ENCOUNTER — Inpatient Hospital Stay

## 2024-02-12 VITALS — BP 118/73 | HR 88 | Temp 98.0°F | Resp 20 | Wt 157.0 lb

## 2024-02-12 DIAGNOSIS — D631 Anemia in chronic kidney disease: Secondary | ICD-10-CM

## 2024-02-12 DIAGNOSIS — D509 Iron deficiency anemia, unspecified: Secondary | ICD-10-CM

## 2024-02-12 LAB — CBC WITH DIFFERENTIAL (CANCER CENTER ONLY)
Abs Immature Granulocytes: 0.03 K/uL (ref 0.00–0.07)
Basophils Absolute: 0 K/uL (ref 0.0–0.1)
Basophils Relative: 1 %
Eosinophils Absolute: 0.3 K/uL (ref 0.0–0.5)
Eosinophils Relative: 5 %
HCT: 33.1 % — ABNORMAL LOW (ref 36.0–46.0)
Hemoglobin: 10.3 g/dL — ABNORMAL LOW (ref 12.0–15.0)
Immature Granulocytes: 1 %
Lymphocytes Relative: 19 %
Lymphs Abs: 1.1 K/uL (ref 0.7–4.0)
MCH: 30 pg (ref 26.0–34.0)
MCHC: 31.1 g/dL (ref 30.0–36.0)
MCV: 96.5 fL (ref 80.0–100.0)
Monocytes Absolute: 0.7 K/uL (ref 0.1–1.0)
Monocytes Relative: 13 %
Neutro Abs: 3.6 K/uL (ref 1.7–7.7)
Neutrophils Relative %: 61 %
Platelet Count: 130 K/uL — ABNORMAL LOW (ref 150–400)
RBC: 3.43 MIL/uL — ABNORMAL LOW (ref 3.87–5.11)
RDW: 16.1 % — ABNORMAL HIGH (ref 11.5–15.5)
WBC Count: 5.8 K/uL (ref 4.0–10.5)
nRBC: 0 % (ref 0.0–0.2)

## 2024-02-12 LAB — CMP (CANCER CENTER ONLY)
ALT: 7 U/L (ref 0–44)
AST: 14 U/L — ABNORMAL LOW (ref 15–41)
Albumin: 4.1 g/dL (ref 3.5–5.0)
Alkaline Phosphatase: 45 U/L (ref 38–126)
Anion gap: 10 (ref 5–15)
BUN: 28 mg/dL — ABNORMAL HIGH (ref 8–23)
CO2: 33 mmol/L — ABNORMAL HIGH (ref 22–32)
Calcium: 9.8 mg/dL (ref 8.9–10.3)
Chloride: 101 mmol/L (ref 98–111)
Creatinine: 1.72 mg/dL — ABNORMAL HIGH (ref 0.44–1.00)
GFR, Estimated: 29 mL/min — ABNORMAL LOW
Glucose, Bld: 112 mg/dL — ABNORMAL HIGH (ref 70–99)
Potassium: 4.8 mmol/L (ref 3.5–5.1)
Sodium: 145 mmol/L (ref 135–145)
Total Bilirubin: 0.5 mg/dL (ref 0.0–1.2)
Total Protein: 6.6 g/dL (ref 6.5–8.1)

## 2024-02-12 MED ORDER — EPOETIN ALFA-EPBX 40000 UNIT/ML IJ SOLN
40000.0000 [IU] | Freq: Once | INTRAMUSCULAR | Status: AC
  Administered 2024-02-12: 40000 [IU] via SUBCUTANEOUS
  Filled 2024-02-12: qty 1

## 2024-03-03 ENCOUNTER — Ambulatory Visit

## 2024-03-04 ENCOUNTER — Inpatient Hospital Stay: Attending: Family | Admitting: Family

## 2024-03-04 ENCOUNTER — Inpatient Hospital Stay

## 2024-03-15 ENCOUNTER — Ambulatory Visit (HOSPITAL_COMMUNITY)

## 2024-03-30 ENCOUNTER — Ambulatory Visit: Admitting: Family Medicine

## 2024-03-30 ENCOUNTER — Ambulatory Visit: Admitting: Internal Medicine

## 2025-01-25 ENCOUNTER — Ambulatory Visit
